# Patient Record
Sex: Male | Born: 1935 | Race: White | Hispanic: No | Marital: Married | Smoking: Former smoker
Health system: Southern US, Community
[De-identification: ages and names within clinical notes are randomized; demographics above are authoritative.]

## PROBLEM LIST (undated history)

## (undated) DIAGNOSIS — D649 Anemia, unspecified: Secondary | ICD-10-CM

## (undated) DIAGNOSIS — K922 Gastrointestinal hemorrhage, unspecified: Secondary | ICD-10-CM

## (undated) DIAGNOSIS — Z86718 Personal history of other venous thrombosis and embolism: Secondary | ICD-10-CM

## (undated) DIAGNOSIS — G459 Transient cerebral ischemic attack, unspecified: Secondary | ICD-10-CM

## (undated) DIAGNOSIS — L719 Rosacea, unspecified: Secondary | ICD-10-CM

## (undated) DIAGNOSIS — N400 Enlarged prostate without lower urinary tract symptoms: Secondary | ICD-10-CM

## (undated) DIAGNOSIS — I639 Cerebral infarction, unspecified: Secondary | ICD-10-CM

## (undated) DIAGNOSIS — R040 Epistaxis: Secondary | ICD-10-CM

## (undated) DIAGNOSIS — I4892 Unspecified atrial flutter: Secondary | ICD-10-CM

## (undated) DIAGNOSIS — M1990 Unspecified osteoarthritis, unspecified site: Secondary | ICD-10-CM

## (undated) DIAGNOSIS — I10 Essential (primary) hypertension: Secondary | ICD-10-CM

## (undated) DIAGNOSIS — Z9889 Other specified postprocedural states: Secondary | ICD-10-CM

## (undated) HISTORY — DX: Gastrointestinal hemorrhage, unspecified: K92.2

## (undated) HISTORY — PX: JOINT REPLACEMENT: SHX530

## (undated) HISTORY — PX: MITRAL VALVE REPAIR: SHX2039

## (undated) HISTORY — DX: Benign prostatic hyperplasia without lower urinary tract symptoms: N40.0

## (undated) HISTORY — DX: Transient cerebral ischemic attack, unspecified: G45.9

## (undated) HISTORY — DX: Essential (primary) hypertension: I10

## (undated) HISTORY — DX: Unspecified osteoarthritis, unspecified site: M19.90

## (undated) HISTORY — DX: Unspecified atrial flutter: I48.92

## (undated) HISTORY — PX: OTHER SURGICAL HISTORY: SHX169

## (undated) HISTORY — DX: Other specified postprocedural states: Z98.890

## (undated) HISTORY — PX: TOTAL HIP ARTHROPLASTY: SHX124

## (undated) HISTORY — DX: Anemia, unspecified: D64.9

## (undated) HISTORY — DX: Cerebral infarction, unspecified: I63.9

## (undated) HISTORY — DX: Rosacea, unspecified: L71.9

## (undated) HISTORY — PX: APPENDECTOMY: SHX54

## (undated) HISTORY — DX: Epistaxis: R04.0

## (undated) HISTORY — PX: HERNIA REPAIR: SHX51

## (undated) HISTORY — DX: Personal history of other venous thrombosis and embolism: Z86.718

---

## 1999-11-07 ENCOUNTER — Encounter: Payer: Self-pay | Admitting: Neurosurgery

## 1999-11-07 ENCOUNTER — Ambulatory Visit (HOSPITAL_COMMUNITY): Admission: RE | Admit: 1999-11-07 | Discharge: 1999-11-07 | Payer: Self-pay | Admitting: Neurosurgery

## 2000-09-16 ENCOUNTER — Inpatient Hospital Stay (HOSPITAL_COMMUNITY): Admission: EM | Admit: 2000-09-16 | Discharge: 2000-09-20 | Payer: Self-pay | Admitting: Emergency Medicine

## 2000-09-20 ENCOUNTER — Emergency Department (HOSPITAL_COMMUNITY): Admission: EM | Admit: 2000-09-20 | Discharge: 2000-09-20 | Payer: Self-pay

## 2000-12-12 ENCOUNTER — Encounter: Payer: Self-pay | Admitting: Internal Medicine

## 2000-12-12 ENCOUNTER — Other Ambulatory Visit: Admission: RE | Admit: 2000-12-12 | Discharge: 2000-12-12 | Payer: Self-pay | Admitting: Gastroenterology

## 2000-12-12 ENCOUNTER — Encounter (INDEPENDENT_AMBULATORY_CARE_PROVIDER_SITE_OTHER): Payer: Self-pay | Admitting: Specialist

## 2004-01-01 ENCOUNTER — Emergency Department (HOSPITAL_COMMUNITY): Admission: EM | Admit: 2004-01-01 | Discharge: 2004-01-02 | Payer: Self-pay | Admitting: Emergency Medicine

## 2004-01-01 ENCOUNTER — Emergency Department (HOSPITAL_COMMUNITY): Admission: EM | Admit: 2004-01-01 | Discharge: 2004-01-01 | Payer: Self-pay | Admitting: Emergency Medicine

## 2004-06-27 ENCOUNTER — Ambulatory Visit: Payer: Self-pay | Admitting: *Deleted

## 2004-07-13 ENCOUNTER — Ambulatory Visit: Payer: Self-pay | Admitting: Cardiology

## 2004-08-03 ENCOUNTER — Ambulatory Visit: Payer: Self-pay | Admitting: Cardiology

## 2004-08-08 ENCOUNTER — Ambulatory Visit: Payer: Self-pay | Admitting: Internal Medicine

## 2004-08-10 ENCOUNTER — Ambulatory Visit: Payer: Self-pay | Admitting: Internal Medicine

## 2004-08-12 ENCOUNTER — Ambulatory Visit: Payer: Self-pay | Admitting: Cardiology

## 2004-08-24 ENCOUNTER — Ambulatory Visit: Payer: Self-pay | Admitting: Cardiology

## 2004-09-05 ENCOUNTER — Ambulatory Visit: Payer: Self-pay | Admitting: *Deleted

## 2004-09-21 ENCOUNTER — Ambulatory Visit: Payer: Self-pay | Admitting: Cardiology

## 2004-10-06 ENCOUNTER — Ambulatory Visit: Payer: Self-pay | Admitting: *Deleted

## 2004-10-20 ENCOUNTER — Ambulatory Visit: Payer: Self-pay | Admitting: Cardiology

## 2004-10-21 ENCOUNTER — Ambulatory Visit: Payer: Self-pay | Admitting: Cardiology

## 2004-10-28 ENCOUNTER — Ambulatory Visit: Payer: Self-pay | Admitting: Cardiology

## 2004-11-10 ENCOUNTER — Ambulatory Visit: Payer: Self-pay | Admitting: Cardiology

## 2004-11-17 ENCOUNTER — Ambulatory Visit: Payer: Self-pay | Admitting: Internal Medicine

## 2004-12-01 ENCOUNTER — Ambulatory Visit: Payer: Self-pay | Admitting: Cardiology

## 2004-12-22 ENCOUNTER — Ambulatory Visit: Payer: Self-pay

## 2005-01-06 ENCOUNTER — Ambulatory Visit: Payer: Self-pay | Admitting: Cardiology

## 2005-01-19 ENCOUNTER — Ambulatory Visit: Payer: Self-pay

## 2005-01-19 ENCOUNTER — Encounter: Payer: Self-pay | Admitting: Internal Medicine

## 2005-01-30 ENCOUNTER — Ambulatory Visit: Payer: Self-pay | Admitting: Internal Medicine

## 2005-02-08 ENCOUNTER — Ambulatory Visit: Payer: Self-pay | Admitting: Cardiology

## 2005-02-08 ENCOUNTER — Ambulatory Visit: Payer: Self-pay

## 2005-02-17 ENCOUNTER — Ambulatory Visit: Payer: Self-pay | Admitting: Internal Medicine

## 2005-03-08 ENCOUNTER — Ambulatory Visit: Payer: Self-pay | Admitting: Cardiology

## 2005-03-15 ENCOUNTER — Ambulatory Visit: Payer: Self-pay | Admitting: Internal Medicine

## 2005-05-19 ENCOUNTER — Ambulatory Visit: Payer: Self-pay | Admitting: Internal Medicine

## 2005-08-24 ENCOUNTER — Ambulatory Visit: Payer: Self-pay | Admitting: Internal Medicine

## 2005-09-06 ENCOUNTER — Ambulatory Visit: Payer: Self-pay | Admitting: Cardiology

## 2005-09-29 ENCOUNTER — Ambulatory Visit: Payer: Self-pay | Admitting: Internal Medicine

## 2005-12-14 ENCOUNTER — Ambulatory Visit: Payer: Self-pay | Admitting: Internal Medicine

## 2005-12-15 ENCOUNTER — Ambulatory Visit: Payer: Self-pay | Admitting: Internal Medicine

## 2006-03-15 ENCOUNTER — Ambulatory Visit: Payer: Self-pay | Admitting: Internal Medicine

## 2006-05-24 ENCOUNTER — Ambulatory Visit: Payer: Self-pay | Admitting: Internal Medicine

## 2006-06-07 ENCOUNTER — Ambulatory Visit: Payer: Self-pay | Admitting: Cardiology

## 2006-08-24 ENCOUNTER — Ambulatory Visit: Payer: Self-pay | Admitting: Internal Medicine

## 2006-10-01 ENCOUNTER — Ambulatory Visit: Payer: Self-pay | Admitting: Internal Medicine

## 2006-10-05 ENCOUNTER — Ambulatory Visit: Payer: Self-pay | Admitting: Internal Medicine

## 2006-11-02 ENCOUNTER — Ambulatory Visit: Payer: Self-pay | Admitting: Internal Medicine

## 2006-11-23 ENCOUNTER — Ambulatory Visit: Payer: Self-pay | Admitting: Internal Medicine

## 2006-11-30 ENCOUNTER — Ambulatory Visit: Payer: Self-pay | Admitting: Internal Medicine

## 2006-11-30 LAB — CONVERTED CEMR LAB
ALT: 25 units/L (ref 0–40)
AST: 32 units/L (ref 0–37)
Albumin: 3.6 g/dL (ref 3.5–5.2)
Alkaline Phosphatase: 81 units/L (ref 39–117)
BUN: 12 mg/dL (ref 6–23)
Basophils Absolute: 0 10*3/uL (ref 0.0–0.1)
Basophils Relative: 0.4 % (ref 0.0–1.0)
Bilirubin, Direct: 0.2 mg/dL (ref 0.0–0.3)
CO2: 32 meq/L (ref 19–32)
Calcium: 9.2 mg/dL (ref 8.4–10.5)
Chloride: 106 meq/L (ref 96–112)
Cholesterol: 148 mg/dL (ref 0–200)
Creatinine, Ser: 0.8 mg/dL (ref 0.4–1.5)
Eosinophils Absolute: 0 10*3/uL (ref 0.0–0.6)
Eosinophils Relative: 0.7 % (ref 0.0–5.0)
GFR calc Af Amer: 123 mL/min
GFR calc non Af Amer: 101 mL/min
Glucose, Bld: 75 mg/dL (ref 70–99)
HCT: 44 % (ref 39.0–52.0)
HDL: 48.6 mg/dL (ref >39.0)
Hemoglobin: 15.1 g/dL (ref 13.0–17.0)
LDL Cholesterol: 88 mg/dL (ref 0–99)
Lymphocytes Relative: 26.1 % (ref 12.0–46.0)
MCHC: 34.3 g/dL (ref 30.0–36.0)
MCV: 88.6 fL (ref 78.0–100.0)
Monocytes Absolute: 0.4 10*3/uL (ref 0.2–0.7)
Monocytes Relative: 8.8 % (ref 3.0–11.0)
Neutro Abs: 3.3 10*3/uL (ref 1.4–7.7)
Neutrophils Relative %: 64 % (ref 43.0–77.0)
PSA: 1.21 ng/mL (ref 0.10–4.00)
Platelets: 184 10*3/uL (ref 150–400)
Potassium: 4.4 meq/L (ref 3.5–5.1)
RBC: 4.97 M/uL (ref 4.22–5.81)
RDW: 13.9 % (ref 11.5–14.6)
Sodium: 144 meq/L (ref 135–145)
TSH: 1.89 microintl units/mL (ref 0.35–5.50)
Total Bilirubin: 1.1 mg/dL (ref 0.3–1.2)
Total CHOL/HDL Ratio: 3
Total Protein: 6.6 g/dL (ref 6.0–8.3)
Triglycerides: 55 mg/dL (ref 0–149)
VLDL: 11 mg/dL (ref 0–40)
WBC: 5 10*3/uL (ref 4.5–10.5)

## 2006-12-07 ENCOUNTER — Ambulatory Visit: Payer: Self-pay | Admitting: Internal Medicine

## 2007-02-13 DIAGNOSIS — Z86718 Personal history of other venous thrombosis and embolism: Secondary | ICD-10-CM | POA: Insufficient documentation

## 2007-02-13 DIAGNOSIS — L719 Rosacea, unspecified: Secondary | ICD-10-CM | POA: Insufficient documentation

## 2007-02-13 DIAGNOSIS — I2510 Atherosclerotic heart disease of native coronary artery without angina pectoris: Secondary | ICD-10-CM | POA: Insufficient documentation

## 2007-02-13 DIAGNOSIS — Z8679 Personal history of other diseases of the circulatory system: Secondary | ICD-10-CM | POA: Insufficient documentation

## 2007-03-06 ENCOUNTER — Ambulatory Visit: Payer: Self-pay | Admitting: Internal Medicine

## 2007-03-06 ENCOUNTER — Encounter: Payer: Self-pay | Admitting: Internal Medicine

## 2007-03-06 DIAGNOSIS — I10 Essential (primary) hypertension: Secondary | ICD-10-CM | POA: Insufficient documentation

## 2007-03-06 DIAGNOSIS — K279 Peptic ulcer, site unspecified, unspecified as acute or chronic, without hemorrhage or perforation: Secondary | ICD-10-CM | POA: Insufficient documentation

## 2007-03-06 DIAGNOSIS — N400 Enlarged prostate without lower urinary tract symptoms: Secondary | ICD-10-CM | POA: Insufficient documentation

## 2007-03-11 LAB — CONVERTED CEMR LAB
PSA, Free Pct: 25 (ref >25)
PSA, Free: 0.3 ng/mL
PSA: 1.18 ng/mL (ref 0.10–4.00)

## 2007-04-01 ENCOUNTER — Ambulatory Visit: Payer: Self-pay | Admitting: Internal Medicine

## 2007-04-18 ENCOUNTER — Ambulatory Visit: Payer: Self-pay | Admitting: Cardiology

## 2007-04-26 ENCOUNTER — Telehealth: Payer: Self-pay | Admitting: Internal Medicine

## 2007-05-08 ENCOUNTER — Encounter: Payer: Self-pay | Admitting: Internal Medicine

## 2007-05-15 ENCOUNTER — Encounter: Payer: Self-pay | Admitting: Internal Medicine

## 2007-05-16 ENCOUNTER — Ambulatory Visit: Payer: Self-pay | Admitting: Cardiology

## 2007-05-16 ENCOUNTER — Encounter: Payer: Self-pay | Admitting: Cardiology

## 2007-05-16 ENCOUNTER — Ambulatory Visit: Payer: Self-pay

## 2007-06-05 ENCOUNTER — Ambulatory Visit: Payer: Self-pay | Admitting: Cardiology

## 2007-06-07 ENCOUNTER — Ambulatory Visit: Payer: Self-pay | Admitting: Internal Medicine

## 2007-06-07 LAB — CONVERTED CEMR LAB
BUN: 14 mg/dL (ref 6–23)
Basophils Absolute: 0 10*3/uL (ref 0.0–0.1)
Basophils Relative: 0.5 % (ref 0.0–1.0)
CO2: 33 meq/L — ABNORMAL HIGH (ref 19–32)
Calcium: 9 mg/dL (ref 8.4–10.5)
Chloride: 106 meq/L (ref 96–112)
Cholesterol: 155 mg/dL (ref 0–200)
Creatinine, Ser: 0.7 mg/dL (ref 0.4–1.5)
Eosinophils Absolute: 0.1 10*3/uL (ref 0.0–0.6)
Eosinophils Relative: 1.5 % (ref 0.0–5.0)
GFR calc Af Amer: 143 mL/min
GFR calc non Af Amer: 118 mL/min
Glucose, Bld: 90 mg/dL (ref 70–99)
HCT: 40.3 % (ref 39.0–52.0)
HDL: 42.8 mg/dL (ref >39.0)
Hemoglobin: 14 g/dL (ref 13.0–17.0)
LDL Cholesterol: 101 mg/dL — ABNORMAL HIGH (ref 0–99)
Lymphocytes Relative: 27.8 % (ref 12.0–46.0)
MCHC: 34.8 g/dL (ref 30.0–36.0)
MCV: 91 fL (ref 78.0–100.0)
Monocytes Absolute: 0.4 10*3/uL (ref 0.2–0.7)
Monocytes Relative: 10 % (ref 3.0–11.0)
Neutro Abs: 2.6 10*3/uL (ref 1.4–7.7)
Neutrophils Relative %: 60.2 % (ref 43.0–77.0)
Platelets: 174 10*3/uL (ref 150–400)
Potassium: 3.9 meq/L (ref 3.5–5.1)
RBC: 4.43 M/uL (ref 4.22–5.81)
RDW: 12.9 % (ref 11.5–14.6)
Sodium: 143 meq/L (ref 135–145)
Total CHOL/HDL Ratio: 3.6
Triglycerides: 57 mg/dL (ref 0–149)
VLDL: 11 mg/dL (ref 0–40)
WBC: 4.3 10*3/uL — ABNORMAL LOW (ref 4.5–10.5)

## 2007-06-18 ENCOUNTER — Telehealth: Payer: Self-pay | Admitting: Internal Medicine

## 2007-06-19 ENCOUNTER — Telehealth: Payer: Self-pay | Admitting: Internal Medicine

## 2007-06-21 ENCOUNTER — Ambulatory Visit: Payer: Self-pay | Admitting: Cardiology

## 2007-07-10 ENCOUNTER — Ambulatory Visit: Payer: Self-pay | Admitting: Internal Medicine

## 2007-07-12 ENCOUNTER — Telehealth (INDEPENDENT_AMBULATORY_CARE_PROVIDER_SITE_OTHER): Payer: Self-pay | Admitting: *Deleted

## 2007-07-22 ENCOUNTER — Ambulatory Visit: Payer: Self-pay | Admitting: Cardiology

## 2007-08-07 ENCOUNTER — Ambulatory Visit: Payer: Self-pay | Admitting: Cardiology

## 2007-08-21 ENCOUNTER — Encounter: Payer: Self-pay | Admitting: Internal Medicine

## 2007-08-28 ENCOUNTER — Ambulatory Visit: Payer: Self-pay | Admitting: Cardiovascular Disease

## 2007-09-04 ENCOUNTER — Ambulatory Visit: Payer: Self-pay | Admitting: Internal Medicine

## 2007-09-19 ENCOUNTER — Ambulatory Visit: Payer: Self-pay | Admitting: Cardiovascular Disease

## 2007-10-17 ENCOUNTER — Ambulatory Visit: Payer: Self-pay | Admitting: Cardiology

## 2007-11-05 ENCOUNTER — Telehealth: Payer: Self-pay | Admitting: Internal Medicine

## 2007-11-07 ENCOUNTER — Ambulatory Visit: Payer: Self-pay | Admitting: Cardiology

## 2007-11-13 ENCOUNTER — Encounter: Payer: Self-pay | Admitting: Internal Medicine

## 2007-12-06 ENCOUNTER — Ambulatory Visit: Payer: Self-pay | Admitting: Cardiology

## 2007-12-11 ENCOUNTER — Ambulatory Visit: Payer: Self-pay | Admitting: Internal Medicine

## 2007-12-27 ENCOUNTER — Ambulatory Visit: Payer: Self-pay | Admitting: Cardiology

## 2008-01-17 ENCOUNTER — Ambulatory Visit: Payer: Self-pay | Admitting: Cardiology

## 2008-02-14 ENCOUNTER — Ambulatory Visit: Payer: Self-pay | Admitting: Cardiology

## 2008-03-12 ENCOUNTER — Ambulatory Visit: Payer: Self-pay | Admitting: Internal Medicine

## 2008-03-12 DIAGNOSIS — I059 Rheumatic mitral valve disease, unspecified: Secondary | ICD-10-CM | POA: Insufficient documentation

## 2008-03-12 DIAGNOSIS — I4892 Unspecified atrial flutter: Secondary | ICD-10-CM | POA: Insufficient documentation

## 2008-03-12 LAB — CONVERTED CEMR LAB
ALT: 26 units/L (ref 0–53)
AST: 32 units/L (ref 0–37)
Albumin: 4 g/dL (ref 3.5–5.2)
Alkaline Phosphatase: 59 units/L (ref 39–117)
BUN: 11 mg/dL (ref 6–23)
Basophils Absolute: 0 10*3/uL (ref 0.0–0.1)
Basophils Relative: 0.3 % (ref 0.0–3.0)
Bilirubin, Direct: 0.1 mg/dL (ref 0.0–0.3)
CO2: 29 meq/L (ref 19–32)
Calcium: 9.1 mg/dL (ref 8.4–10.5)
Chloride: 101 meq/L (ref 96–112)
Cholesterol: 142 mg/dL (ref 0–200)
Creatinine, Ser: 0.7 mg/dL (ref 0.4–1.5)
Eosinophils Absolute: 0 10*3/uL (ref 0.0–0.7)
Eosinophils Relative: 0.9 % (ref 0.0–5.0)
GFR calc Af Amer: 142 mL/min
GFR calc non Af Amer: 117 mL/min
Glucose, Bld: 90 mg/dL (ref 70–99)
HCT: 39.1 % (ref 39.0–52.0)
HDL: 41.4 mg/dL (ref >39.0)
Hemoglobin: 14.1 g/dL (ref 13.0–17.0)
LDL Cholesterol: 87 mg/dL (ref 0–99)
Lymphocytes Relative: 24.4 % (ref 12.0–46.0)
MCHC: 36 g/dL (ref 30.0–36.0)
MCV: 91.9 fL (ref 78.0–100.0)
Monocytes Absolute: 0.5 10*3/uL (ref 0.1–1.0)
Monocytes Relative: 10.1 % (ref 3.0–12.0)
Neutro Abs: 3.2 10*3/uL (ref 1.4–7.7)
Neutrophils Relative %: 64.3 % (ref 43.0–77.0)
PSA: 1.66 ng/mL (ref 0.10–4.00)
Platelets: 166 10*3/uL (ref 150–400)
Potassium: 3.7 meq/L (ref 3.5–5.1)
RBC: 4.26 M/uL (ref 4.22–5.81)
RDW: 13.3 % (ref 11.5–14.6)
Sodium: 137 meq/L (ref 135–145)
TSH: 1.24 microintl units/mL (ref 0.35–5.50)
Total Bilirubin: 1.5 mg/dL — ABNORMAL HIGH (ref 0.3–1.2)
Total CHOL/HDL Ratio: 3.4
Total Protein: 6.6 g/dL (ref 6.0–8.3)
Triglycerides: 69 mg/dL (ref 0–149)
VLDL: 14 mg/dL (ref 0–40)
WBC: 4.9 10*3/uL (ref 4.5–10.5)

## 2008-03-16 ENCOUNTER — Telehealth: Payer: Self-pay | Admitting: *Deleted

## 2008-03-17 ENCOUNTER — Telehealth: Payer: Self-pay | Admitting: Internal Medicine

## 2008-03-19 ENCOUNTER — Encounter: Payer: Self-pay | Admitting: Internal Medicine

## 2008-04-09 ENCOUNTER — Ambulatory Visit: Payer: Self-pay | Admitting: Cardiology

## 2008-05-04 ENCOUNTER — Encounter: Payer: Self-pay | Admitting: *Deleted

## 2008-05-07 ENCOUNTER — Ambulatory Visit: Payer: Self-pay | Admitting: Cardiology

## 2008-05-15 ENCOUNTER — Encounter: Payer: Self-pay | Admitting: Internal Medicine

## 2008-05-21 ENCOUNTER — Ambulatory Visit: Payer: Self-pay | Admitting: Cardiology

## 2008-06-04 ENCOUNTER — Ambulatory Visit: Payer: Self-pay | Admitting: Cardiology

## 2008-06-12 ENCOUNTER — Ambulatory Visit: Payer: Self-pay | Admitting: Internal Medicine

## 2008-06-12 DIAGNOSIS — T887XXA Unspecified adverse effect of drug or medicament, initial encounter: Secondary | ICD-10-CM | POA: Insufficient documentation

## 2008-06-12 LAB — CONVERTED CEMR LAB
ALT: 25 units/L (ref 0–53)
AST: 30 units/L (ref 0–37)
Albumin: 3.7 g/dL (ref 3.5–5.2)
Alkaline Phosphatase: 63 units/L (ref 39–117)
BUN: 14 mg/dL (ref 6–23)
Basophils Absolute: 0 10*3/uL (ref 0.0–0.1)
Basophils Relative: 0.1 % (ref 0.0–3.0)
Bilirubin, Direct: 0.1 mg/dL (ref 0.0–0.3)
CO2: 32 meq/L (ref 19–32)
Calcium: 9.1 mg/dL (ref 8.4–10.5)
Chloride: 103 meq/L (ref 96–112)
Creatinine, Ser: 0.7 mg/dL (ref 0.4–1.5)
Eosinophils Absolute: 0.1 10*3/uL (ref 0.0–0.7)
Eosinophils Relative: 2.2 % (ref 0.0–5.0)
GFR calc Af Amer: 142 mL/min
GFR calc non Af Amer: 117 mL/min
Glucose, Bld: 88 mg/dL (ref 70–99)
HCT: 40.5 % (ref 39.0–52.0)
Hemoglobin: 14.2 g/dL (ref 13.0–17.0)
Lymphocytes Relative: 26.2 % (ref 12.0–46.0)
MCHC: 35.2 g/dL (ref 30.0–36.0)
MCV: 94.2 fL (ref 78.0–100.0)
Monocytes Absolute: 0.4 10*3/uL (ref 0.1–1.0)
Monocytes Relative: 9.4 % (ref 3.0–12.0)
Neutro Abs: 2.5 10*3/uL (ref 1.4–7.7)
Neutrophils Relative %: 62.1 % (ref 43.0–77.0)
Platelets: 176 10*3/uL (ref 150–400)
Potassium: 3.8 meq/L (ref 3.5–5.1)
RBC: 4.3 M/uL (ref 4.22–5.81)
RDW: 12.6 % (ref 11.5–14.6)
Sodium: 140 meq/L (ref 135–145)
Total Bilirubin: 0.8 mg/dL (ref 0.3–1.2)
Total Protein: 6.7 g/dL (ref 6.0–8.3)
WBC: 4.1 10*3/uL — ABNORMAL LOW (ref 4.5–10.5)

## 2008-07-02 ENCOUNTER — Ambulatory Visit: Payer: Self-pay | Admitting: Cardiology

## 2008-07-02 LAB — CONVERTED CEMR LAB
ALT: 26 units/L (ref 0–53)
AST: 28 units/L (ref 0–37)
Albumin: 3.7 g/dL (ref 3.5–5.2)
Alkaline Phosphatase: 67 units/L (ref 39–117)
BUN: 13 mg/dL (ref 6–23)
Bilirubin, Direct: 0.1 mg/dL (ref 0.0–0.3)
CO2: 30 meq/L (ref 19–32)
Calcium: 9 mg/dL (ref 8.4–10.5)
Chloride: 102 meq/L (ref 96–112)
Cholesterol: 158 mg/dL (ref 0–200)
Creatinine, Ser: 0.7 mg/dL (ref 0.4–1.5)
GFR calc Af Amer: 142 mL/min
GFR calc non Af Amer: 117 mL/min
Glucose, Bld: 96 mg/dL (ref 70–99)
HDL: 46 mg/dL (ref >39.0)
LDL Cholesterol: 102 mg/dL — ABNORMAL HIGH (ref 0–99)
Potassium: 3.5 meq/L (ref 3.5–5.1)
Sodium: 138 meq/L (ref 135–145)
Total Bilirubin: 0.9 mg/dL (ref 0.3–1.2)
Total CHOL/HDL Ratio: 3.4
Total Protein: 6.4 g/dL (ref 6.0–8.3)
Triglycerides: 49 mg/dL (ref 0–149)
VLDL: 10 mg/dL (ref 0–40)

## 2008-07-22 ENCOUNTER — Ambulatory Visit: Payer: Self-pay | Admitting: Cardiology

## 2008-08-13 ENCOUNTER — Ambulatory Visit: Payer: Self-pay | Admitting: Cardiovascular Disease

## 2008-09-01 ENCOUNTER — Telehealth: Payer: Self-pay | Admitting: Internal Medicine

## 2008-09-03 ENCOUNTER — Encounter: Payer: Self-pay | Admitting: Internal Medicine

## 2008-09-14 ENCOUNTER — Ambulatory Visit: Payer: Self-pay | Admitting: Internal Medicine

## 2008-09-14 ENCOUNTER — Telehealth: Payer: Self-pay | Admitting: Internal Medicine

## 2008-09-23 ENCOUNTER — Ambulatory Visit: Payer: Self-pay | Admitting: Cardiovascular Disease

## 2008-10-15 ENCOUNTER — Ambulatory Visit: Payer: Self-pay | Admitting: Cardiology

## 2008-10-29 ENCOUNTER — Ambulatory Visit: Payer: Self-pay | Admitting: Cardiology

## 2008-11-19 ENCOUNTER — Ambulatory Visit: Payer: Self-pay | Admitting: Internal Medicine

## 2008-11-19 DIAGNOSIS — R040 Epistaxis: Secondary | ICD-10-CM | POA: Insufficient documentation

## 2008-11-23 ENCOUNTER — Ambulatory Visit: Payer: Self-pay | Admitting: Cardiology

## 2008-12-14 ENCOUNTER — Ambulatory Visit: Payer: Self-pay | Admitting: Internal Medicine

## 2009-01-11 ENCOUNTER — Encounter: Payer: Self-pay | Admitting: Internal Medicine

## 2009-01-13 ENCOUNTER — Ambulatory Visit: Payer: Self-pay | Admitting: Cardiology

## 2009-01-15 ENCOUNTER — Encounter: Payer: Self-pay | Admitting: Internal Medicine

## 2009-01-19 ENCOUNTER — Encounter: Payer: Self-pay | Admitting: *Deleted

## 2009-02-03 ENCOUNTER — Ambulatory Visit: Payer: Self-pay | Admitting: Internal Medicine

## 2009-02-03 LAB — CONVERTED CEMR LAB
POC INR: 3.5
Protime: 22.5

## 2009-02-18 ENCOUNTER — Ambulatory Visit: Payer: Self-pay | Admitting: Internal Medicine

## 2009-02-18 LAB — CONVERTED CEMR LAB
ALT: 26 units/L (ref 0–53)
AST: 28 units/L (ref 0–37)
Albumin: 3.8 g/dL (ref 3.5–5.2)
Alkaline Phosphatase: 71 units/L (ref 39–117)
Calcium: 9.1 mg/dL (ref 8.4–10.5)
Cholesterol: 149 mg/dL (ref 0–200)
Creatinine, Ser: 0.6 mg/dL (ref 0.4–1.5)
GFR calc non Af Amer: 139.94 mL/min (ref >60)
HDL: 41.2 mg/dL (ref >39.00)
INR: 2.7
Prothrombin Time: 19.9 s
Sodium: 141 meq/L (ref 135–145)
Total CHOL/HDL Ratio: 4
Total Protein: 6.5 g/dL (ref 6.0–8.3)
Triglycerides: 70 mg/dL (ref 0.0–149.0)

## 2009-02-20 DIAGNOSIS — Z8669 Personal history of other diseases of the nervous system and sense organs: Secondary | ICD-10-CM | POA: Insufficient documentation

## 2009-02-20 DIAGNOSIS — Z9889 Other specified postprocedural states: Secondary | ICD-10-CM | POA: Insufficient documentation

## 2009-02-20 DIAGNOSIS — M1990 Unspecified osteoarthritis, unspecified site: Secondary | ICD-10-CM | POA: Insufficient documentation

## 2009-02-20 DIAGNOSIS — Z96649 Presence of unspecified artificial hip joint: Secondary | ICD-10-CM | POA: Insufficient documentation

## 2009-02-20 DIAGNOSIS — Z9089 Acquired absence of other organs: Secondary | ICD-10-CM | POA: Insufficient documentation

## 2009-02-23 ENCOUNTER — Ambulatory Visit: Payer: Self-pay | Admitting: Internal Medicine

## 2009-02-23 LAB — CONVERTED CEMR LAB
Basophils Absolute: 0 10*3/uL (ref 0.0–0.1)
HCT: 40.6 % (ref 39.0–52.0)
Lymphs Abs: 1 10*3/uL (ref 0.7–4.0)
Monocytes Absolute: 0.4 10*3/uL (ref 0.1–1.0)
Monocytes Relative: 9.8 % (ref 3.0–12.0)
Neutrophils Relative %: 59.3 % (ref 43.0–77.0)
Platelets: 150 10*3/uL (ref 150.0–400.0)
RDW: 12.9 % (ref 11.5–14.6)
WBC: 3.6 10*3/uL — ABNORMAL LOW (ref 4.5–10.5)

## 2009-02-24 ENCOUNTER — Encounter: Payer: Self-pay | Admitting: *Deleted

## 2009-03-08 ENCOUNTER — Ambulatory Visit: Payer: Self-pay | Admitting: Cardiovascular Disease

## 2009-03-08 LAB — CONVERTED CEMR LAB
POC INR: 2.4
Prothrombin Time: 19 s

## 2009-03-30 ENCOUNTER — Encounter (INDEPENDENT_AMBULATORY_CARE_PROVIDER_SITE_OTHER): Payer: Self-pay | Admitting: *Deleted

## 2009-04-12 ENCOUNTER — Ambulatory Visit: Payer: Self-pay | Admitting: Cardiology

## 2009-04-23 ENCOUNTER — Ambulatory Visit: Payer: Self-pay | Admitting: Family Medicine

## 2009-04-27 ENCOUNTER — Telehealth (INDEPENDENT_AMBULATORY_CARE_PROVIDER_SITE_OTHER): Payer: Self-pay | Admitting: *Deleted

## 2009-04-27 ENCOUNTER — Telehealth: Payer: Self-pay | Admitting: Gastroenterology

## 2009-05-10 ENCOUNTER — Ambulatory Visit: Payer: Self-pay | Admitting: Cardiology

## 2009-05-10 LAB — CONVERTED CEMR LAB: POC INR: 2.9

## 2009-05-27 ENCOUNTER — Ambulatory Visit: Payer: Self-pay | Admitting: Internal Medicine

## 2009-05-27 LAB — CONVERTED CEMR LAB
Albumin: 3.7 g/dL (ref 3.5–5.2)
BUN: 16 mg/dL (ref 6–23)
Basophils Absolute: 0 10*3/uL (ref 0.0–0.1)
CO2: 32 meq/L (ref 19–32)
Eosinophils Absolute: 0.1 10*3/uL (ref 0.0–0.7)
Glucose, Bld: 82 mg/dL (ref 70–99)
HCT: 44.8 % (ref 39.0–52.0)
Hemoglobin: 15.9 g/dL (ref 13.0–17.0)
Lymphs Abs: 1.2 10*3/uL (ref 0.7–4.0)
MCHC: 35.5 g/dL (ref 30.0–36.0)
Monocytes Absolute: 0.6 10*3/uL (ref 0.1–1.0)
Neutro Abs: 3.4 10*3/uL (ref 1.4–7.7)
Potassium: 3.6 meq/L (ref 3.5–5.1)
RDW: 12.9 % (ref 11.5–14.6)
Sodium: 135 meq/L (ref 135–145)

## 2009-06-01 ENCOUNTER — Telehealth: Payer: Self-pay | Admitting: Internal Medicine

## 2009-06-07 ENCOUNTER — Ambulatory Visit: Payer: Self-pay | Admitting: Internal Medicine

## 2009-06-07 LAB — CONVERTED CEMR LAB: POC INR: 3.4

## 2009-06-30 ENCOUNTER — Ambulatory Visit: Payer: Self-pay | Admitting: Cardiology

## 2009-06-30 ENCOUNTER — Ambulatory Visit: Payer: Self-pay | Admitting: Internal Medicine

## 2009-06-30 LAB — CONVERTED CEMR LAB: POC INR: 2.7

## 2009-07-28 ENCOUNTER — Ambulatory Visit: Payer: Self-pay | Admitting: Cardiology

## 2009-07-28 ENCOUNTER — Encounter (INDEPENDENT_AMBULATORY_CARE_PROVIDER_SITE_OTHER): Payer: Self-pay | Admitting: Cardiology

## 2009-07-28 LAB — CONVERTED CEMR LAB: POC INR: 2.8

## 2009-09-03 ENCOUNTER — Ambulatory Visit: Payer: Self-pay | Admitting: Cardiology

## 2009-09-03 ENCOUNTER — Encounter (INDEPENDENT_AMBULATORY_CARE_PROVIDER_SITE_OTHER): Payer: Self-pay | Admitting: Cardiology

## 2009-09-03 LAB — CONVERTED CEMR LAB: POC INR: 3

## 2009-09-06 ENCOUNTER — Ambulatory Visit: Payer: Self-pay | Admitting: Internal Medicine

## 2009-09-06 DIAGNOSIS — L570 Actinic keratosis: Secondary | ICD-10-CM | POA: Insufficient documentation

## 2009-09-10 ENCOUNTER — Encounter: Payer: Self-pay | Admitting: Internal Medicine

## 2009-10-01 ENCOUNTER — Ambulatory Visit: Payer: Self-pay | Admitting: Internal Medicine

## 2009-10-22 ENCOUNTER — Ambulatory Visit: Payer: Self-pay | Admitting: Cardiovascular Disease

## 2009-11-01 ENCOUNTER — Encounter: Payer: Self-pay | Admitting: Cardiology

## 2009-11-01 ENCOUNTER — Ambulatory Visit: Payer: Self-pay | Admitting: Cardiology

## 2009-11-01 ENCOUNTER — Ambulatory Visit (HOSPITAL_COMMUNITY): Admission: RE | Admit: 2009-11-01 | Discharge: 2009-11-01 | Payer: Self-pay | Admitting: Cardiology

## 2009-11-01 ENCOUNTER — Ambulatory Visit: Payer: Self-pay

## 2009-11-15 ENCOUNTER — Ambulatory Visit: Payer: Self-pay | Admitting: Internal Medicine

## 2009-11-15 LAB — CONVERTED CEMR LAB: POC INR: 2

## 2009-11-19 ENCOUNTER — Encounter: Payer: Self-pay | Admitting: Internal Medicine

## 2009-12-06 ENCOUNTER — Ambulatory Visit: Payer: Self-pay | Admitting: Internal Medicine

## 2009-12-06 DIAGNOSIS — C44300 Unspecified malignant neoplasm of skin of unspecified part of face: Secondary | ICD-10-CM | POA: Insufficient documentation

## 2009-12-13 ENCOUNTER — Ambulatory Visit: Payer: Self-pay | Admitting: Cardiology

## 2009-12-29 ENCOUNTER — Telehealth: Payer: Self-pay | Admitting: Internal Medicine

## 2010-01-12 ENCOUNTER — Ambulatory Visit: Payer: Self-pay | Admitting: Internal Medicine

## 2010-01-26 ENCOUNTER — Encounter: Payer: Self-pay | Admitting: Cardiology

## 2010-01-31 ENCOUNTER — Telehealth: Payer: Self-pay | Admitting: Cardiology

## 2010-02-01 ENCOUNTER — Ambulatory Visit: Payer: Self-pay | Admitting: Internal Medicine

## 2010-02-01 ENCOUNTER — Ambulatory Visit: Payer: Self-pay | Admitting: Cardiology

## 2010-02-01 LAB — CONVERTED CEMR LAB: POC INR: 2.2

## 2010-03-03 ENCOUNTER — Ambulatory Visit: Payer: Self-pay | Admitting: Cardiology

## 2010-03-03 LAB — CONVERTED CEMR LAB: POC INR: 2.3

## 2010-03-07 ENCOUNTER — Ambulatory Visit: Payer: Self-pay | Admitting: Internal Medicine

## 2010-03-07 DIAGNOSIS — M67919 Unspecified disorder of synovium and tendon, unspecified shoulder: Secondary | ICD-10-CM | POA: Insufficient documentation

## 2010-03-07 DIAGNOSIS — M719 Bursopathy, unspecified: Secondary | ICD-10-CM

## 2010-03-31 ENCOUNTER — Ambulatory Visit: Payer: Self-pay | Admitting: Internal Medicine

## 2010-04-29 ENCOUNTER — Ambulatory Visit: Payer: Self-pay | Admitting: Cardiology

## 2010-04-29 LAB — CONVERTED CEMR LAB: POC INR: 2.5

## 2010-05-06 ENCOUNTER — Encounter: Payer: Self-pay | Admitting: Internal Medicine

## 2010-05-17 ENCOUNTER — Encounter: Payer: Self-pay | Admitting: Internal Medicine

## 2010-06-06 ENCOUNTER — Ambulatory Visit: Payer: Self-pay | Admitting: Cardiology

## 2010-06-06 LAB — CONVERTED CEMR LAB: POC INR: 2.3

## 2010-06-10 ENCOUNTER — Encounter: Payer: Self-pay | Admitting: Internal Medicine

## 2010-06-14 ENCOUNTER — Telehealth: Payer: Self-pay | Admitting: Internal Medicine

## 2010-07-04 ENCOUNTER — Ambulatory Visit: Payer: Self-pay | Admitting: Internal Medicine

## 2010-07-04 LAB — CONVERTED CEMR LAB
Alkaline Phosphatase: 78 units/L (ref 39–117)
Bilirubin, Direct: 0.2 mg/dL (ref 0.0–0.3)
CO2: 28 meq/L (ref 19–32)
Calcium: 9 mg/dL (ref 8.4–10.5)
GFR calc non Af Amer: 109.45 mL/min (ref >60)
HDL: 46.9 mg/dL (ref >39.00)
Sodium: 137 meq/L (ref 135–145)
Total CHOL/HDL Ratio: 3
Total Protein: 6.3 g/dL (ref 6.0–8.3)

## 2010-07-11 ENCOUNTER — Ambulatory Visit: Payer: Self-pay | Admitting: Internal Medicine

## 2010-07-11 LAB — CONVERTED CEMR LAB
Cholesterol, target level: 200 mg/dL
LDL Goal: 100 mg/dL

## 2010-08-04 ENCOUNTER — Ambulatory Visit: Payer: Self-pay | Admitting: Cardiovascular Disease

## 2010-08-04 LAB — CONVERTED CEMR LAB: POC INR: 2.9

## 2010-09-05 ENCOUNTER — Ambulatory Visit
Admission: RE | Admit: 2010-09-05 | Discharge: 2010-09-05 | Payer: Self-pay | Source: Home / Self Care | Attending: Cardiology | Admitting: Cardiology

## 2010-09-05 ENCOUNTER — Ambulatory Visit: Admission: RE | Admit: 2010-09-05 | Discharge: 2010-09-05 | Payer: Self-pay | Source: Home / Self Care

## 2010-09-05 ENCOUNTER — Other Ambulatory Visit: Payer: Self-pay | Admitting: Cardiology

## 2010-09-05 ENCOUNTER — Encounter: Payer: Self-pay | Admitting: Cardiology

## 2010-09-05 LAB — CBC WITH DIFFERENTIAL/PLATELET
Basophils Absolute: 0 10*3/uL (ref 0.0–0.1)
Basophils Relative: 0.2 % (ref 0.0–3.0)
Eosinophils Absolute: 0 10*3/uL (ref 0.0–0.7)
Eosinophils Relative: 0.8 % (ref 0.0–5.0)
HCT: 40.7 % (ref 39.0–52.0)
Hemoglobin: 14.1 g/dL (ref 13.0–17.0)
Lymphocytes Relative: 19 % (ref 12.0–46.0)
Lymphs Abs: 1.1 10*3/uL (ref 0.7–4.0)
MCHC: 34.7 g/dL (ref 30.0–36.0)
MCV: 93.2 fl (ref 78.0–100.0)
Monocytes Absolute: 0.6 10*3/uL (ref 0.1–1.0)
Monocytes Relative: 9.6 % (ref 3.0–12.0)
Neutro Abs: 4.2 10*3/uL (ref 1.4–7.7)
Neutrophils Relative %: 70.4 % (ref 43.0–77.0)
Platelets: 200 10*3/uL (ref 150.0–400.0)
RBC: 4.37 Mil/uL (ref 4.22–5.81)
RDW: 13.8 % (ref 11.5–14.6)
WBC: 5.9 10*3/uL (ref 4.5–10.5)

## 2010-09-05 LAB — CONVERTED CEMR LAB: POC INR: 2.6

## 2010-09-22 NOTE — Letter (Signed)
Summary: Handout Printed  Printed Handout:  - Coumadin Instructions-w/out Meds 

## 2010-09-22 NOTE — Progress Notes (Signed)
Summary: REQ FOR REFILL (Warfarin Sodium / Coumadin)  Phone Note Refill Request Message from:  Fax from Pharmacy on Dec 29, 2009 2:11 PM  Refills Requested: Medication #1:  COUMADIN 5 MG  TABS Take as directed by coumadin clinic.   Notes: Warfarin Sodium 5mg  - [facility] # [id number], [place], [place]    Initial call taken by: Debbra Riding,  Dec 29, 2009 2:11 PM    Prescriptions: COUMADIN 5 MG  TABS (WARFARIN SODIUM) Take as directed by coumadin clinic.  #90 x 3   Entered by:   Willy Eddy, LPN   Authorized by:   Stacie Glaze MD   Signed by:   Willy Eddy, LPN on 40/34/7425   Method used:   Electronically to        [facility].* (retail)       [address]       [place], [place]  [address]       Ph: [phone]       Fax: [phone]   RxID:   [id number] COUMADIN 5 MG  TABS (WARFARIN SODIUM) Take as directed by coumadin clinic.  #90 x 3   Entered by:   Willy Eddy, LPN   Authorized by:   Stacie Glaze MD   Signed by:   Willy Eddy, LPN on 25/42/7062   Method used:   Electronically to        [facility]. [phone]* (retail)       [address]       [place], [place]  [address]       Ph: [phone]       Fax: [phone]   RxID:   [id number]   Appended Document: REQ FOR REFILL (Warfarin Sodium / Coumadin) [facility] called left message on machine sent in error

## 2010-09-22 NOTE — Medication Information (Signed)
Summary: [email or web address]  Anticoagulant Therapy  Managed by: Cloyde Reams, RN, BSN Supervising MD: Gala Romney MD, Reuel Boom Indication 1: Atrial Fibrillation (ICD-427.31) Lab Used: [facility] Site: [facility] INR POC 3.5 INR RANGE 2 - 3  Dietary changes: no    Health status changes: no    Bleeding/hemorrhagic complications: no    Recent/future hospitalizations: no    Any changes in medication regimen? no    Recent/future dental: no  Any missed doses?: no       Is patient compliant with meds? yes      Comments: Pt states he took the 1/2 tablet on Sat x 1 week only, then resumed 1 tablet daily.    Allergies (verified): 1)  ! Penicillin  Anticoagulation Management History:      The patient is taking warfarin and comes in today for a routine follow up visit.  Positive risk factors for bleeding include an age of 40 years or older and history of GI bleeding.  The bleeding index is 'intermediate risk'.  Positive CHADS2 values include History of HTN.  Negative CHADS2 values include Age > 4 years old.  The start date was 03/25/2007.  His last INR was 2.8.  Anticoagulation responsible provider: Alexyss Balzarini MD, Reuel Boom.  INR POC: 3.5.  Cuvette Lot#: [id number].  Exp: 11/2010.    Anticoagulation Management Assessment/Plan:      The patient's current anticoagulation dose is Coumadin 5 mg  tabs: Take as directed by coumadin clinic..  The target INR is 2 - 3.  The next INR is due 10/22/2009.  Anticoagulation instructions were given to patient.  Results were reviewed/authorized by Cloyde Reams, RN, BSN.  He was notified by Cloyde Reams RN.         Prior Anticoagulation Instructions: INR 3.0  Take 0.5 tab each Saturday and 1 tab on all other days.  Recheck in 4 weeks.    Current Anticoagulation Instructions: INR 3.5  Skip tomorrow's dosage of coumadin, then start taking 1 tablet daily except 1/2 tablet on Saturdays.  Recheck in 3 weeks.

## 2010-09-22 NOTE — Assessment & Plan Note (Signed)
Summary: 1 mo mole removal per Bonnie/mm/PT RESCD FROM BUMP//CCM   Vital Signs:  Patient profile:   77 year old male Height:      67 inches Weight:      156 pounds BMI:     24.52 Temp:     98.2 degrees F Pulse rate:   68 / minute  Vitals Entered By: Willy Eddy, LPN (December 06, 2009 11:56 AM) CC: mole removal   CC:  mole removal.  History of Present Illness: the pt presents for mole bx for non healing ulcer on eaar and AK on the pinna of the same ear he is on coumadin  Preventive Screening-Counseling & Management  Alcohol-Tobacco     Smoking Status: quit     Passive Smoke Exposure: no  Current Problems (verified): 1)  Actinic Keratosis, Head  (ICD-702.0) 2)  Coumadin Therapy  (ICD-V58.61) 3)  Appendectomy, Hx of  (ICD-V45.79) 4)  Degenerative Joint Disease  (ICD-715.90) 5)  Benign Prostatic Hypertrophy, Hx of  (ICD-V13.8) 6)  Herniorrhaphy, Hx of  (ICD-V45.89) 7)  Hip Replacement, Total, Hx of  (ICD-V43.64) 8)  Mitral Valve Replacement, Hx of  (ICD-V15.1) 9)  Syncope, Hx of  (ICD-V12.49) 10)  Anemia, Hx of  (ICD-285.9) 11)  Upper Gastrointestinal Hemorrhage, Hx of  (ICD-578.9) 12)  Epistaxis, Recurrent  (ICD-784.7) 13)  Uns Advrs Eff Uns Rx Medicinal&biological Sbstnc  (ICD-995.20) 14)  Atrial Flutter  (ICD-427.32) 15)  Other and Unspecified Mitral Valve Diseases  (ICD-394.9) 16)  Preventive Health Care  (ICD-V70.0) 17)  Family History of Colon Ca 1st Degree Relative <60  (ICD-V16.0) 18)  Family History of Cad Male 1st Degree Relative <60  (ICD-V16.49) 19)  Family History of Cad Male 1st Degree Relative <50  (ICD-V17.3) 20)  Peptic Ulcer Disease  (ICD-533.90) 21)  Benign Prostatic Hypertrophy  (ICD-600.00) 22)  Hypertension  (ICD-401.9) 23)  Transient Ischemic Attack, Hx of  (ICD-V12.50) 24)  Dvt, Hx of  (ICD-V12.51) 25)  Rosacea  (ICD-695.3) 26)  Coronary Artery Disease  (ICD-414.00)  Current Medications (verified): 1)  Benazepril-Hydrochlorothiazide  5-6.25 Mg Tabs (Benazepril-Hydrochlorothiazide) .Marland Kitchen.. 1 Once Daily 2)  Cardura 1 Mg  Tabs (Doxazosin Mesylate) .... Take 1 Tablet By Mouth Once A Day 3)  Proscar 5 Mg  Tabs (Finasteride) .... Take 1 Tablet By Mouth Once A Day 4)  Protonix 40 Mg  Tbec (Pantoprazole Sodium) .... Take 1 Tablet By Mouth Once A Day 5)  Metoprolol Succinate 25 Mg Xr24h-Tab (Metoprolol Succinate) .Marland Kitchen.. 1 in Am and 2 in Pm 6)  Coumadin 5 Mg  Tabs (Warfarin Sodium) .... Take As Directed By Coumadin Clinic. 7)  Alprazolam 0.25 Mg  Tabs (Alprazolam) .... One By Mouth Q Hs 8)  Doxycycline Hyclate 100 Mg Caps (Doxycycline Hyclate) .Marland Kitchen.. 1 Two Times A Day 9)  Clindamycin Hcl 150 Mg Caps (Clindamycin Hcl) .... Take 4 Caps 1 Hour Prior To Dental Work  Allergies (verified): 1)  ! Penicillin   Impression & Recommendations:  Problem # 1:  ACTINIC KERATOSIS, HEAD (ICD-702.0)  the lesion was identifies as a    AK on the right ear      and 40 seconds of cryotherapy with the liguid nitrogen gun was apllied to the site. 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 1 once daily 2)  Cardura 1 Mg Tabs (Doxazosin mesylate) .... Take 1 tablet by mouth once a day 3)  Proscar 5 Mg Tabs (Finasteride) .... Take 1 tablet by mouth once a day 4)  Protonix 40 Mg Tbec (Pantoprazole sodium) .... Take 1 tablet by mouth once a day 5)  Metoprolol  Succinate 25 Mg Xr24h-tab (Metoprolol succinate) .Marland Kitchen.. 1 in am and 2 in pm 6)  Coumadin 5 Mg Tabs (Warfarin sodium) .... Take as directed by coumadin clinic. 7)  Alprazolam 0.25 Mg Tabs (Alprazolam) .... One by mouth q hs 8)  Doxycycline Hyclate 100 Mg Caps (Doxycycline hyclate) .Marland Kitchen.. 1 two times a day 9)  Clindamycin Hcl 150 Mg Caps (Clindamycin hcl) .... Take 4 caps 1 hour prior to dental work  Appended Document: 1 mo mole removal per Bonnie/mm/PT RESCD FROM BUMP//CCM     Allergies: 1)  ! 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 Using a 15 blade the lesion was removed from the posterior aspects of  the right ear  and sent for pathology. sterile dressings were applied  and wound care discussed with the pt. pt will be called with path results and will be referred  for surgery is margins are positive  Complete Medication List: 1)  Benazepril-hydrochlorothiazide 5-6.25 Mg Tabs (Benazepril-hydrochlorothiazide) .Marland Kitchen.. 1 once daily 2)  Cardura 1 Mg Tabs (Doxazosin mesylate) .... Take 1 tablet by mouth once a day 3)  Proscar 5 Mg Tabs (Finasteride) .... Take 1 tablet by mouth once a day 4)  Protonix 40 Mg Tbec (Pantoprazole sodium) .... Take 1 tablet by mouth once a day 5)  Metoprolol Succinate 25 Mg Xr24h-tab (Metoprolol succinate) .Marland Kitchen.. 1 in am and 2 in pm 6)  Coumadin 5 Mg Tabs (Warfarin sodium) .... Take as directed by coumadin clinic. 7)  Alprazolam 0.25 Mg Tabs (Alprazolam) .... One by mouth q hs 8)  Doxycycline Hyclate 100 Mg Caps (Doxycycline hyclate) .Marland Kitchen.. 1 two times a day 9)  Clindamycin Hcl 150 Mg Caps (Clindamycin hcl) .... Take 4 caps 1 hour prior to dental work

## 2010-09-22 NOTE — Medication Information (Signed)
Summary: rov/cs   Anticoagulant Therapy  Managed by: Weston Brass, PharmD PCP: Stacie Glaze MD Supervising MD: Nahser Indication 1: Atrial Fibrillation (ICD-427.31) Lab Used: [facility] Site: [facility] INR POC 2.9 INR RANGE 2 - 3  Dietary changes: no    Health status changes: no    Bleeding/hemorrhagic complications: no    Recent/future hospitalizations: no    Any changes in medication regimen? no    Recent/future dental: no  Any missed doses?: no       Is patient compliant with meds? yes       Allergies: 1)  ! Penicillin  Anticoagulation Management History:      The patient is taking warfarin and comes in today for a routine follow up visit.  Positive risk factors for bleeding include an age of 75 years or older and history of GI bleeding.  The bleeding index is 'intermediate risk'.  Positive CHADS2 values include History of HTN and Age > 41 years old.  The start date was 03/25/2007.  His last INR was 3.2.  Anticoagulation responsible Darneshia Demary: Nahser.  INR POC: 2.9.  Cuvette Lot#: [id number].  Exp: 09/2011.    Anticoagulation Management Assessment/Plan:      The patient's current anticoagulation dose is Coumadin 5 mg  tabs: Take as directed by coumadin clinic..  The target INR is 2 - 3.  The next INR is due 09/05/2010.  Anticoagulation instructions were given to patient.  Results were reviewed/authorized by Weston Brass, PharmD.  He was notified by Weston Brass PharmD.         Prior Anticoagulation Instructions: INR 2.3  Continue Coumadin as scheduled:  1 tablet every day of the week, except 1/2 tablet on Saturday.  Return to clinic in 4 weeks.   Current Anticoagulation Instructions: INR 2.9  Continue same dose of 1 tablet every day except 1/2 tablet on Saturday.  Recheck INR in 4 weeks.

## 2010-09-22 NOTE — Assessment & Plan Note (Signed)
Summary: 3 MONTH ROV/NJR   Vital Signs:  Patient profile:   77 year old male Height:      67 inches Weight:      160 pounds BMI:     25.15 Temp:     98.2 degrees F oral Pulse rate:   76 / minute Resp:     14 per minute BP sitting:   130 / 80  (left arm)  Vitals Entered By: Willy Eddy, LPN (March 07, 2010 8:01 AM) CC: roa- c/o pain in left armpit   CC:  roa- c/o pain in left armpit.  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) 2)  Actinic Keratosis, Head  (ICD-702.0) 3)  Coumadin Therapy  (ICD-V58.61) 4)  Appendectomy, Hx of  (ICD-V45.79) 5)  Degenerative Joint Disease  (ICD-715.90) 6)  Benign Prostatic Hypertrophy, Hx of  (ICD-V13.8) 7)  Herniorrhaphy, Hx of  (ICD-V45.89) 8)  Hip Replacement, Total, Hx of  (ICD-V43.64) 9)  Mitral Valve Replacement, Hx of  (ICD-V15.1) 10)  Syncope, Hx of  (ICD-V12.49) 11)  Anemia, Hx of  (ICD-285.9) 12)  Upper Gastrointestinal Hemorrhage, Hx of  (ICD-578.9) 13)  Epistaxis, Recurrent  (ICD-784.7) 14)  Uns Advrs Eff Uns Rx Medicinal&biological Sbstnc  (ICD-995.20) 15)  Atrial Flutter  (ICD-427.32) 16)  Other and Unspecified Mitral Valve Diseases  (ICD-394.9) 17)  Preventive Health Care  (ICD-V70.0) 18)  Family History of Colon Ca 1st Degree Relative <60  (ICD-V16.0) 19)  Family History of Cad Male 1st Degree Relative <60  (ICD-V16.49) 20)  Family History of Cad Male 1st Degree Relative <50  (ICD-V17.3) 21)  Peptic Ulcer Disease  (ICD-533.90) 22)  Benign Prostatic Hypertrophy  (ICD-600.00) 23)  Hypertension  (ICD-401.9) 24)  Transient Ischemic Attack, Hx  of  (ICD-V12.50) 25)  Dvt, Hx of  (ICD-V12.51) 26)  Rosacea  (ICD-695.3) 27)  Coronary Artery Disease  (ICD-414.00)  Current Problems (verified): 1)  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) 2)  Actinic Keratosis, Head  (ICD-702.0) 3)  Coumadin Therapy  (ICD-V58.61) 4)  Appendectomy, Hx of  (ICD-V45.79) 5)  Degenerative Joint Disease  (ICD-715.90) 6)  Benign Prostatic Hypertrophy, Hx of  (ICD-V13.8) 7)  Herniorrhaphy, Hx of  (ICD-V45.89) 8)  Hip Replacement, Total, Hx of  (ICD-V43.64) 9)  Mitral Valve Replacement, Hx of  (ICD-V15.1) 10)  Syncope, Hx of  (ICD-V12.49) 11)  Anemia, Hx of  (ICD-285.9) 12)  Upper Gastrointestinal Hemorrhage, Hx of  (ICD-578.9) 13)  Epistaxis, Recurrent  (ICD-784.7) 14)  Uns Advrs Eff Uns Rx Medicinal&biological Sbstnc  (ICD-995.20) 15)  Atrial Flutter  (ICD-427.32) 16)  Other and Unspecified Mitral Valve Diseases  (ICD-394.9) 17)  Preventive Health Care  (ICD-V70.0) 18)  Family History of Colon Ca 1st Degree Relative <60  (ICD-V16.0) 19)  Family History of Cad Male 1st Degree Relative <60  (ICD-V16.49) 20)  Family History of Cad Male 1st Degree Relative <50  (ICD-V17.3) 21)  Peptic Ulcer Disease  (ICD-533.90) 22)  Benign Prostatic Hypertrophy  (ICD-600.00) 23)  Hypertension  (ICD-401.9) 24)  Transient Ischemic Attack, Hx of  (ICD-V12.50) 25)  Dvt, Hx of  (  ICD-V12.51) 26)  Rosacea  (ICD-695.3) 27)  Coronary Artery Disease  (ICD-414.00)  Medications Prior to Update: 1)  Benazepril-Hydrochlorothiazide 5-6.25 Mg Tabs (Benazepril-Hydrochlorothiazide) .Marland Kitchen.. 1 Once Daily 2)  Cardura 1 Mg  Tabs (Doxazosin Mesylate) .... Take 1 Tablet By Mouth Once A Day 3)  Proscar 5 Mg  Tabs (Finasteride) .... Take 1 Tablet By Mouth Once A Day 4)  Protonix 40 Mg  Tbec (Pantoprazole Sodium) .... Take 1 Tablet By Mouth Once A Day 5)  Metoprolol Succinate 25 Mg Xr24h-Tab (Metoprolol Succinate) .Marland Kitchen.. 1 in Am and 2 in Pm 6)  Coumadin 5 Mg  Tabs (Warfarin  Sodium) .... Take As Directed By Coumadin Clinic. 7)  Alprazolam 0.25 Mg  Tabs (Alprazolam) .... One By Mouth Q Hs 8)  Doxycycline Hyclate 100 Mg Caps (Doxycycline Hyclate) .Marland Kitchen.. 1 Two Times A Day 9)  Clindamycin Hcl 150 Mg Caps (Clindamycin Hcl) .... Take 4 Caps 1 Hour Prior To Dental Work  Current Medications (verified): 1)  Benazepril-Hydrochlorothiazide 5-6.25 Mg Tabs (Benazepril-Hydrochlorothiazide) .Marland Kitchen.. 1 Once Daily 2)  Cardura 1 Mg  Tabs (Doxazosin Mesylate) .... Take 1 Tablet By Mouth Once A Day 3)  Proscar 5 Mg  Tabs (Finasteride) .... Take 1 Tablet By Mouth Once A Day 4)  Protonix 40 Mg  Tbec (Pantoprazole Sodium) .... Take 1 Tablet By Mouth Once A Day 5)  Metoprolol Succinate 25 Mg Xr24h-Tab (Metoprolol Succinate) .Marland Kitchen.. 1 in Am and 2 in Pm 6)  Coumadin 5 Mg  Tabs (Warfarin Sodium) .... Take As Directed By Coumadin Clinic. 7)  Alprazolam 0.25 Mg  Tabs (Alprazolam) .... One By Mouth Q Hs 8)  Doxycycline Hyclate 100 Mg Caps (Doxycycline Hyclate) .Marland Kitchen.. 1 Two Times A Day 9)  Clindamycin Hcl 150 Mg Caps (Clindamycin Hcl) .... Take 4 Caps 1 Hour Prior To Dental Work 10)  Terbinafine Hcl 250 Mg Tabs (Terbinafine Hcl) .... One By Mouth Daily  Allergies (verified): 1)  ! Penicillin  Past History:  Family History: Last updated: 03/06/2007 mother with severe Mitral valve dz died at age 19 Family History of CAD Male 1st degree relative <50 faher dieD mi AT AGE 66 Family History of Colon CA 1st degree relative <60 COUSIN Family History of Prostate CA 1st degree relative AT 11 COUSIN  Social History: Last updated: 03/06/2007 Retired Married Former Smoker [place]  Risk Factors: Smoking Status: quit (03/07/2010) Passive Smoke Exposure: no (03/07/2010)  Past medical, surgical, family and social histories (including risk factors) reviewed, and no changes noted (except as noted below).  Past Medical History: Reviewed history from 06/30/2009 and no changes required. DEGENERATIVE  JOINT DISEASE (ICD-715.90) BENIGN PROSTATIC HYPERTROPHY, HX OF (ICD-V13.8) ANEMIA, HX OF (ICD-285.9) UPPER GASTROINTESTINAL HEMORRHAGE, HX OF (ICD-578.9) EPISTAXIS, RECURRENT (ICD-784.7) ATRIAL FLUTTER (ICD-427.32) BENIGN PROSTATIC HYPERTROPHY (ICD-600.00) HYPERTENSION (ICD-401.9) TRANSIENT ISCHEMIC ATTACK, HX OF (ICD-V12.50) DVT, HX OF (ICD-V12.51) ROSACEA (ICD-695.3)    Past Surgical History: Reviewed history from 06/30/2009 and no changes required. APPENDECTOMY, HX OF (ICD-V45.79) HERNIORRHAPHY, HX OF (ICD-V45.89) HIP REPLACEMENT, TOTAL, HX OF (ICD-V43.64) history of mitral valve repair  Family History: Reviewed history from 03/06/2007 and no changes required. mother with severe Mitral valve dz died at age 89 Family History of CAD Male 1st degree relative <50 faher dieD mi AT AGE 3 Family History of Colon CA 1st degree relative <60 COUSIN Family History of Prostate CA 1st degree relative AT 58 COUSIN  Social History: Reviewed history from 03/06/2007 and no changes required. Retired Married Former Smoker [place]  Review of  Systems  The patient denies anorexia, fever, weight loss, weight gain, vision loss, decreased hearing, hoarseness, chest pain, syncope, dyspnea on exertion, peripheral edema, prolonged cough, headaches, hemoptysis, abdominal pain, melena, hematochezia, severe indigestion/heartburn, hematuria, incontinence, genital sores, muscle weakness, suspicious skin lesions, transient blindness, difficulty walking, depression, unusual weight change, abnormal bleeding, enlarged lymph nodes, angioedema, and breast masses.    Physical Exam  General:  Well-developed,well-nourished,in no acute distress; alert,appropriate and cooperative throughout examination Head:  normocephalic and atraumatic.  male-pattern balding.   Eyes:  pupils equal and pupils round.   Ears:  R ear normal and L ear normal.   Nose:  no external deformity and no nasal discharge.   Mouth:   pharynx pink and moist and no erythema.   Neck:  No deformities, masses, or tenderness noted. Lungs:  normal respiratory effort and no intercostal retractions.   Heart:  normal rate and regular rhythm.   2/6 systollic murmur no rub and no JVD.   Abdomen:  Bowel sounds positive,abdomen soft and non-tender without masses, organomegaly or hernias noted. Msk:  No deformity or scoliosis noted of thoracic or lumbar spine.   Pulses:  R and L carotid,radial,femoral,dorsalis pedis and posterior tibial pulses are full and equal bilaterally Extremities:  No clubbing, cyanosis, edema, or deformity noted with normal full range of motion of all joints.   Neurologic:  alert & oriented X3 and gait normal.     Impression & Recommendations:  Problem # 1:  DEGENERATIVE JOINT DISEASE (ICD-715.90)  shoulder r pain  from rotator cuff discussion of the  use of medications   Discussed use of medications, application of heat or cold, and exercises.   Problem # 2:  HYPERTENSION (ICD-401.9)  His updated medication list for this problem includes:    Benazepril-hydrochlorothiazide 5-6.25 Mg Tabs (Benazepril-hydrochlorothiazide) .Marland Kitchen... 1 once daily    Cardura 1 Mg Tabs (Doxazosin mesylate) .Marland Kitchen... Take 1 tablet by mouth once a day    Metoprolol Succinate 25 Mg Xr24h-tab (Metoprolol succinate) .Marland Kitchen... 1 in am and 2 in pm  BP today: 130/80 Prior BP: 124/76 (09/06/2009)  Prior 10 Yr Risk Heart Disease: N/A (06/07/2007)  Labs Reviewed: K+: 3.6 (05/27/2009) Creat: : 0.7 (05/27/2009)   Chol: 153 (05/27/2009)   HDL: 42.30 (05/27/2009)   LDL: 94 (02/18/2009)   TG: 70.0 (02/18/2009)  Problem # 3:  CORONARY ARTERY DISEASE (ICD-414.00)  His updated medication list for this problem includes:    Benazepril-hydrochlorothiazide 5-6.25 Mg Tabs (Benazepril-hydrochlorothiazide) .Marland Kitchen... 1 once daily    Cardura 1 Mg Tabs (Doxazosin mesylate) .Marland Kitchen... Take 1 tablet by mouth once a day    Metoprolol Succinate 25 Mg Xr24h-tab  (Metoprolol succinate) .Marland Kitchen... 1 in am and 2 in pm  Labs Reviewed: Chol: 153 (05/27/2009)   HDL: 42.30 (05/27/2009)   LDL: 94 (02/18/2009)   TG: 70.0 (02/18/2009)  Complete Medication List: 1)  Benazepril-hydrochlorothiazide 5-6.25 Mg Tabs (Benazepril-hydrochlorothiazide) .Marland Kitchen.. 1 once daily 2)  Cardura 1 Mg Tabs (Doxazosin mesylate) .... Take 1 tablet by mouth once a day 3)  Proscar 5 Mg Tabs (Finasteride) .... Take 1 tablet by mouth once a day 4)  Protonix 40 Mg Tbec (Pantoprazole sodium) .... Take 1 tablet by mouth once a day 5)  Metoprolol Succinate 25 Mg Xr24h-tab (Metoprolol succinate) .Marland Kitchen.. 1 in am and 2 in pm 6)  Coumadin 5 Mg Tabs (Warfarin sodium) .... Take as directed by coumadin clinic. 7)  Alprazolam 0.25 Mg Tabs (Alprazolam) .... One by mouth q hs 8)  Doxycycline Hyclate 100  Mg Caps (Doxycycline hyclate) .Marland Kitchen.. 1 two times a day 9)  Clindamycin Hcl 150 Mg Caps (Clindamycin hcl) .... Take 4 caps 1 hour prior to dental work 10)  Terbinafine Hcl 250 Mg Tabs (Terbinafine hcl) .... One by mouth daily  Other Orders: Orthopedic Surgeon Referral (Ortho Surgeon) Orthopedic Referral (Ortho)  Patient Instructions: 1)  Please schedule a follow-up appointment in 3 months. 2)  BMP prior to visit, ICD-9:409.10 3)  Hepatic Panel prior to visit, ICD-9:995.20 4)  Lipid Panel prior to visit, ICD-9:272.4 5)  TSH prior to visit, ICD-9:272.4 6)  PSA prior to visit, ICD-9:601.1 Prescriptions: TERBINAFINE HCL 250 MG TABS (TERBINAFINE HCL) one by mouth daily  #30 x 2   Entered and Authorized by:   Stacie Glaze MD   Signed by:   Stacie Glaze MD on 03/07/2010   Method used:   Print then Give to Patient   RxID:   [id number] CLINDAMYCIN HCL 150 MG CAPS (CLINDAMYCIN HCL) take 4 caps 1 hour prior to dental work  #4 Each x 2   Entered by:   Willy Eddy, LPN   Authorized by:   Stacie Glaze MD   Signed by:   Willy Eddy, LPN on 14/78/2956   Method used:   Print then Give to  Patient   RxID:   [id number] DOXYCYCLINE HYCLATE 100 MG CAPS (DOXYCYCLINE HYCLATE) 1 two times a day  #180 x 3   Entered by:   Willy Eddy, LPN   Authorized by:   Stacie Glaze MD   Signed by:   Willy Eddy, LPN on 28/41/3244   Method used:   Print then Give to Patient   RxID:   [id number] ALPRAZOLAM 0.25 MG  TABS (ALPRAZOLAM) one by mouth q HS  #90 x 3   Entered by:   Willy Eddy, LPN   Authorized by:   Stacie Glaze MD   Signed by:   Willy Eddy, LPN on 42/59/5638   Method used:   Print then Give to Patient   RxID:   [id number] COUMADIN 5 MG  TABS (WARFARIN SODIUM) Take as directed by coumadin clinic.  #100 x 3   Entered by:   Willy Eddy, LPN   Authorized by:   Stacie Glaze MD   Signed by:   Willy Eddy, LPN on 02/18/1600   Method used:   Print then Give to Patient   RxID:   [id number] METOPROLOL SUCCINATE 25 MG XR24H-TAB (METOPROLOL SUCCINATE) 1 in am and 2 in pm  #270.0 Each x 2   Entered by:   Willy Eddy, LPN   Authorized by:   Stacie Glaze MD   Signed by:   Willy Eddy, LPN on 70/62/3762   Method used:   Print then Give to Patient   RxID:   [id number] PROTONIX 40 MG  TBEC (PANTOPRAZOLE SODIUM) Take 1 tablet by mouth once a day  #90.0 Each x 3   Entered by:   Willy Eddy, LPN   Authorized by:   Stacie Glaze MD   Signed by:   Willy Eddy, LPN on 26/94/8546   Method used:   Print then Give to Patient   RxID:   [id number] PROSCAR 5 MG  TABS (FINASTERIDE) Take 1 tablet by mouth once a day  #90 x 3   Entered by:   Willy Eddy, LPN   Authorized by:   Balinda Quails  Lovell Sheehan MD   Signed by:   Willy Eddy, LPN on 98/06/9146   Method used:   Print then Give to Patient   RxID:   [id number] CARDURA 1 MG  TABS (DOXAZOSIN MESYLATE) Take 1 tablet by mouth once a day  #90.0 Each x 3   Entered by:   Willy Eddy, LPN   Authorized by:   Stacie Glaze MD   Signed by:    Willy Eddy, LPN on 96/29/5284   Method used:   Print then Give to Patient   RxID:   [id number] BENAZEPRIL-HYDROCHLOROTHIAZIDE 5-6.25 MG TABS (BENAZEPRIL-HYDROCHLOROTHIAZIDE) 1 once daily  #90 x 3   Entered by:   Willy Eddy, LPN   Authorized by:   Stacie Glaze MD   Signed by:   Willy Eddy, LPN on 40/34/7425   Method used:   Print then Give to Patient   RxID:   [id number]

## 2010-09-22 NOTE — Medication Information (Signed)
Summary: rov/sp  Anticoagulant Therapy  Managed by: Eda Keys, PharmD Supervising MD: Gala Romney MD, Reuel Boom Indication 1: Atrial Fibrillation (ICD-427.31) Lab Used: [facility] Site: [facility] INR POC 2.4 INR RANGE 2 - 3  Dietary changes: no    Health status changes: no    Bleeding/hemorrhagic complications: no    Recent/future hospitalizations: no    Any changes in medication regimen? no    Recent/future dental: yes     Details: recent dental work, including 3 day course of clindamycin  Any missed doses?: no       Is patient compliant with meds? yes       Allergies: 1)  ! Penicillin  Anticoagulation Management History:      The patient is taking warfarin and comes in today for a routine follow up visit.  Positive risk factors for bleeding include an age of 75 years or older and history of GI bleeding.  The bleeding index is 'intermediate risk'.  Positive CHADS2 values include History of HTN.  Negative CHADS2 values include Age > 101 years old.  The start date was 03/25/2007.  His last INR was 2.9.  Anticoagulation responsible provider: Umair Rosiles MD, Reuel Boom.  INR POC: 2.4.  Cuvette Lot#: [id number].  Exp: 03/2011.    Anticoagulation Management Assessment/Plan:      The patient's current anticoagulation dose is Coumadin 5 mg  tabs: Take as directed by coumadin clinic..  The target INR is 2 - 3.  The next INR is due 02/09/2010.  Anticoagulation instructions were given to patient.  Results were reviewed/authorized by Eda Keys, PharmD.  He was notified by Eda Keys.         Prior Anticoagulation Instructions: INR 2.3  Continue same dose of 1 tablet every day except 1/2 tablet on Saturday   Current Anticoagulation Instructions: INR 2.4  Continue taking 1 tablet every day, except for 1/2 tablet on Saturday.  Return to clinic in 4 weeks.

## 2010-09-22 NOTE — Medication Information (Signed)
Summary: rov/eac  Anticoagulant Therapy  Managed by: Elaina Pattee, PharmD Supervising MD: Gala Romney MD, Reuel Boom Indication 1: Atrial Fibrillation (ICD-427.31) Lab Used: [facility] Site: [facility] INR POC 2.2 INR RANGE 2 - 3  Dietary changes: no    Health status changes: no    Bleeding/hemorrhagic complications: no    Recent/future hospitalizations: no    Any changes in medication regimen? no    Recent/future dental: no  Any missed doses?: no       Is patient compliant with meds? yes      Comments: Pt interested in Pradaxa, however he has a hx of ulcer.  Will discuss with MD, although I advised this may not be the best option.  Allergies: 1)  ! Penicillin  Anticoagulation Management History:      The patient is taking warfarin and comes in today for a routine follow up visit.  Positive risk factors for bleeding include an age of 75 years or older and history of GI bleeding.  The bleeding index is 'intermediate risk'.  Positive CHADS2 values include History of HTN.  Negative CHADS2 values include Age > 50 years old.  The start date was 03/25/2007.  His last INR was 2.9.  Anticoagulation responsible provider: Jossilyn Benda MD, Reuel Boom.  INR POC: 2.2.  Cuvette Lot#: [id number].  Exp: 03/2011.    Anticoagulation Management Assessment/Plan:      The patient's current anticoagulation dose is Coumadin 5 mg  tabs: Take as directed by coumadin clinic..  The target INR is 2 - 3.  The next INR is due 03/01/2010.  Anticoagulation instructions were given to patient.  Results were reviewed/authorized by Elaina Pattee, PharmD.  He was notified by Elaina Pattee, PharmD.         Prior Anticoagulation Instructions: INR 2.4  Continue taking 1 tablet every day, except for 1/2 tablet on Saturday.  Return to clinic in 4 weeks.    Current Anticoagulation Instructions: INR 2.2. Take 1 tablet daily except 0.5 tablet on Saturdays.  Recheck in 4 weeks.

## 2010-09-22 NOTE — Progress Notes (Signed)
Summary: Pt req to get coumedin lvls added to labs on 07/04/10  Phone Note Call from Patient   Caller: Patient Reason for Call: Acute Illness Summary of Call: Pt is sch to come in for labs on 07/04/10 and he is req to get his coumedin lvls checked that day also. Pls advise.    Initial call taken by: Lucy Antigua,  June 14, 2010 1:21 PM  Follow-up for Phone Call        that is fine. just tell gwenn or nana that he needs a pt Follow-up by: Willy Eddy, LPN,  June 14, 2010 1:26 PM  Additional Follow-up for Phone Call Additional follow up Details #1::        Pt has been notified that the coumedin lvl has been added to labs on 07/04/10 Additional Follow-up by: Lucy Antigua,  June 14, 2010 5:00 PM

## 2010-09-22 NOTE — Letter (Signed)
Summary: [facility]  [facility]   Imported By: Maryln Gottron 06/15/2010 10:42:03  _____________________________________________________________________  External Attachment:    Type:   Image     Comment:   External Document

## 2010-09-22 NOTE — Medication Information (Signed)
Summary: rov/mj  Anticoagulant Therapy  Managed by: Bethena Midget, RN, BSN Referring MD: Jens Som MD, Arlys John PCP: Stacie Glaze MD Supervising MD: Gala Romney MD, Reuel Boom Indication 1: Atrial Fibrillation (ICD-427.31) Lab Used: [facility] Site: [facility] INR POC 2.6 INR RANGE 2 - 3  Dietary changes: no    Health status changes: no    Bleeding/hemorrhagic complications: no    Recent/future hospitalizations: no    Any changes in medication regimen? yes       Details: Planning to start Glusoamine Chondrotin  Recent/future dental: no  Any missed doses?: no       Is patient compliant with meds? yes       Allergies: 1)  ! Penicillin  Anticoagulation Management History:      The patient is taking warfarin and comes in today for a routine follow up visit.  Positive risk factors for bleeding include an age of 35 years or older and history of GI bleeding.  The bleeding index is 'intermediate risk'.  Positive CHADS2 values include History of HTN and Age > 61 years old.  The start date was 03/25/2007.  His last INR was 3.2.  Anticoagulation responsible provider: Bensimhon MD, Reuel Boom.  INR POC: 2.6.  Cuvette Lot#: [id number].  Exp: 09/2011.    Anticoagulation Management Assessment/Plan:      The patient's current anticoagulation dose is Coumadin 5 mg  tabs: Take as directed by coumadin clinic..  The target INR is 2 - 3.  The next INR is due 10/05/2010.  Anticoagulation instructions were given to patient.  Results were reviewed/authorized by Bethena Midget, RN, BSN.  He was notified by Bethena Midget, RN, BSN.         Prior Anticoagulation Instructions: INR 2.9  Continue same dose of 1 tablet every day except 1/2 tablet on Saturday.  Recheck INR in 4 weeks.   Current Anticoagulation Instructions: INR 2.6 Continue 1 tablet everyday except 1/2  tablets on Saturdays. Recheck in 4 weeks.

## 2010-09-22 NOTE — Letter (Signed)
Summary: Application for Handicapped Placard  Application for Handicapped Placard   Imported By: Maryln Gottron 05/11/2010 14:18:20  _____________________________________________________________________  External Attachment:    Type:   Image     Comment:   External Document

## 2010-09-22 NOTE — Progress Notes (Signed)
Summary: out of rhythm   Phone Note Call from Patient Call back at Work Phone [phone]   Caller: Patient Reason for Call: Talk to Nurse Summary of Call: pt states his heart is out of rhythm.... pulse has been elevated, request call back Initial call taken by: Migdalia Dk,  January 31, 2010 11:28 AM  Follow-up for Phone Call        spoke with pt, he has noticed his heart rate running high. he states last night his pulse got up to 140 and then went down after taking his evening dose of metoprolol. his bp is 100/70. he is unable to tell if he is in atrial flutter or not and he report pulse readings from 40-70 and higher. pt will cont on present meds. he will come to the office tomorrow am for an EKG to confirm rhythm. pt denies other symptoms Deliah Goody, RN  January 31, 2010 12:30 PM

## 2010-09-22 NOTE — Letter (Signed)
Summary: [facility]   Imported By: Marylou Mccoy 03/08/2010 07:45:34  _____________________________________________________________________  External Attachment:    Type:   Image     Comment:   External Document

## 2010-09-22 NOTE — Medication Information (Signed)
Summary: rov/ln  Anticoagulant Therapy  Managed by: Weston Brass, PharmD Supervising MD: Gala Romney MD, Reuel Boom Indication 1: Atrial Fibrillation (ICD-427.31) Lab Used: [facility] Site: [facility] INR POC 2.9 INR RANGE 2 - 3  Dietary changes: no    Health status changes: no    Bleeding/hemorrhagic complications: no    Recent/future hospitalizations: no    Any changes in medication regimen? no    Recent/future dental: no  Any missed doses?: no       Is patient compliant with meds? yes       Allergies: 1)  ! Penicillin  Anticoagulation Management History:      The patient is taking warfarin and comes in today for a routine follow up visit.  Positive risk factors for bleeding include an age of 75 years or older and history of GI bleeding.  The bleeding index is 'intermediate risk'.  Positive CHADS2 values include History of HTN and Age > 75 years old.  The start date was 03/25/2007.  His last INR was 2.9.  Anticoagulation responsible provider: Bensimhon MD, Reuel Boom.  INR POC: 2.9.  Cuvette Lot#: [id number].  Exp: 04/2011.    Anticoagulation Management Assessment/Plan:      The patient's current anticoagulation dose is Coumadin 5 mg  tabs: Take as directed by coumadin clinic..  The target INR is 2 - 3.  The next INR is due 04/29/2010.  Anticoagulation instructions were given to patient.  Results were reviewed/authorized by Weston Brass, PharmD.  He was notified by Gweneth Fritter, PharmD Candidate.         Prior Anticoagulation Instructions: INR 2.2  Continue same regimen of 1 tab daily except 1/2 tab on Saturday.  Re-check INR in 4 weeks.  Current Anticoagulation Instructions: INR  2.9  Continue taking 1 tablet (5mg ) every day except take 1/2 tablet (2.5mg ) on Saturdays.  Recheck in 4 weeks.

## 2010-09-22 NOTE — Assessment & Plan Note (Signed)
Summary: F1Y/DM  Medications Added GLUCOSAMINE-CHONDROITIN   CAPS (GLUCOSAMINE-CHONDROIT-VIT C-MN) 1 tab by mouth once daily METOPROLOL TARTRATE 25 MG TABS (METOPROLOL TARTRATE) Take one tablet every am and 2 tablets every pm        Primary Provider:  Stacie Glaze MD   History of Present Illness: Brian [name] is a pleasant gentleman who has a history of mitral valve repair as well as atrial flutter.  A Myoview was performed in June 2005. Ejection fraction was 64%. There was a nonreversible inferior defect which was felt to be infarct versus diaphragm. There was no ischemia. His last echocardiogram was performed in March of 2011.  At that time, he had normal LV function.  There is mild aortic insufficiency and mitral regurgitation.  Left atrium is mildly dilated. There was also right atrial and right ventricular enlargement and moderate tricuspid regurgitation. I last saw him in November of 2010. Since then he denies any dyspnea on exertion, orthopnea, PND, pedal edema, palpitations, syncope or chest pain.   Current Medications (verified): 1)  Benazepril-Hydrochlorothiazide 5-6.25 Mg Tabs (Benazepril-Hydrochlorothiazide) .Marland Kitchen.. 1 Once Daily 2)  Cardura 1 Mg  Tabs (Doxazosin Mesylate) .... Take 1 Tablet By Mouth Once A Day 3)  Proscar 5 Mg  Tabs (Finasteride) .... Take 1 Tablet By Mouth Once A Day 4)  Protonix 40 Mg  Tbec (Pantoprazole Sodium) .... Take 1 Tablet By Mouth Once A Day 5)  Metoprolol Succinate 25 Mg Xr24h-Tab (Metoprolol Succinate) .Marland Kitchen.. 1 in Am and 2 in Pm 6)  Coumadin 5 Mg  Tabs (Warfarin Sodium) .... Take As Directed By Coumadin Clinic. 7)  Alprazolam 0.25 Mg  Tabs (Alprazolam) .... One By Mouth Q Hs 8)  Doxycycline Hyclate 100 Mg Caps (Doxycycline Hyclate) .Marland Kitchen.. 1 Two Times A Day 9)  Clindamycin Hcl 150 Mg Caps (Clindamycin Hcl) .... Take 4 Caps 1 Hour Prior To Dental Work 10)  Glucosamine-Chondroitin   Caps (Glucosamine-Chondroit-Vit C-Mn) .Marland Kitchen.. 1 Tab By Mouth Once  Daily  Allergies: 1)  ! Penicillin  Past History:  Past Medical History: Reviewed history from 06/30/2009 and no changes required. DEGENERATIVE JOINT DISEASE (ICD-715.90) BENIGN PROSTATIC HYPERTROPHY, HX OF (ICD-V13.8) ANEMIA, HX OF (ICD-285.9) UPPER GASTROINTESTINAL HEMORRHAGE, HX OF (ICD-578.9) EPISTAXIS, RECURRENT (ICD-784.7) ATRIAL FLUTTER (ICD-427.32) BENIGN PROSTATIC HYPERTROPHY (ICD-600.00) HYPERTENSION (ICD-401.9) TRANSIENT ISCHEMIC ATTACK, HX OF (ICD-V12.50) DVT, HX OF (ICD-V12.51) ROSACEA (ICD-695.3)    Past Surgical History: Reviewed history from 06/30/2009 and no changes required. APPENDECTOMY, HX OF (ICD-V45.79) HERNIORRHAPHY, HX OF (ICD-V45.89) HIP REPLACEMENT, TOTAL, HX OF (ICD-V43.64) history of mitral valve repair  Social History: Reviewed history from 03/06/2007 and no changes required. Retired Married Former Smoker [place]  Review of Systems       no fevers or chills, productive cough, hemoptysis, dysphasia, odynophagia, melena, hematochezia, dysuria, hematuria, rash, seizure activity, orthopnea, PND, pedal edema, claudication. Remaining systems are negative.   Vital Signs:  Patient profile:   77 year old male Height:      87 inches Weight:      160 pounds BMI:     14.92 Pulse rate:   60 / minute Resp:     14 per minute BP sitting:   140 / 80  (left arm)  Vitals Entered By: Kem Parkinson (September 05, 2010 9:22 AM)  Physical Exam  General:  Well-developed well-nourished in no acute distress.  Skin is warm and dry.  HEENT is normal.  Neck is supple. No thyromegaly.  Chest is clear to auscultation with normal expansion.  Cardiovascular  exam is regular rate and rhythm.  Abdominal exam nontender or distended. No masses palpated. Extremities show no edema. neuro grossly intact    EKG  Procedure date:  09/05/2010  Findings:      Sinus rhythm with first degree AV block and PACs. Left anterior fascicular block. Left ventricular  hypertrophy. Prior septal infarct.  Impression & Recommendations:  Problem # 1:  ROTATOR CUFF SYNDROME, LEFT (ICD-726.10) Pt feels that he may require surgery in the near future. No cardiac symptoms. I think he can proceed without further cardiac evaluation. Given history of atrial flutter a TIA he will need to be bridged with Lovenox when he comes off of his Coumadin.  Problem # 2:  COUMADIN THERAPY (ICD-V58.61) Monitored in the Coumadin clinic.goal INR 2-3.  Problem # 3:  MITRAL VALVE REPLACEMENT, HX OF (ICD-V15.1) History of mitral valve repair. Repeat echocardiogram March 2012. Continued SBE prophylaxis. Orders: Echocardiogram (Echo)  Problem # 4:  ATRIAL FLUTTER (ICD-427.32) Change Toprol to Lopressor at patient's request secondary to financial reasons. Continue Coumadin. His updated medication list for this problem includes:    Metoprolol Succinate 25 Mg Xr24h-tab (Metoprolol succinate) .Marland Kitchen... 1 in am and 2 in pm    Coumadin 5 Mg Tabs (Warfarin sodium) .Marland Kitchen... Take as directed by coumadin clinic.    Metoprolol Tartrate 25 Mg Tabs (Metoprolol tartrate) .Marland Kitchen... Take one tablet every am and 2 tablets every pm  Problem # 5:  HYPERTENSION (ICD-401.9) Blood pressure controlled on present medications. Will continue. Potassium and renal function monitored by primary care. His updated medication list for this problem includes:    Benazepril-hydrochlorothiazide 5-6.25 Mg Tabs (Benazepril-hydrochlorothiazide) .Marland Kitchen... 1 once daily    Cardura 1 Mg Tabs (Doxazosin mesylate) .Marland Kitchen... Take 1 tablet by mouth once a day    Metoprolol Succinate 25 Mg Xr24h-tab (Metoprolol succinate) .Marland Kitchen... 1 in am and 2 in pm    Metoprolol Tartrate 25 Mg Tabs (Metoprolol tartrate) .Marland Kitchen... Take one tablet every am and 2 tablets every pm  Other Orders: TLB-CBC Platelet - w/Differential ([id number])  Patient Instructions: 1)  Your physician wants you to follow-up in: ONE YEAR  You will receive a reminder letter in the mail two  months in advance. If you don't receive a letter, please call our office to schedule the follow-up appointment. 2)  Your physician has requested that you have an echocardiogram.  Echocardiography is a painless test that uses sound waves to create images of your heart. It provides your doctor with information about the size and shape of your heart and how well your heart's chambers and valves are working.  This procedure takes approximately one hour. There are no restrictions for this procedure. 3)  Your physician has recommended you make the following change in your medication: STOP TOPROL 4)  START METOPROLOL TART 25MG  ONE TABLET IN THE AM AND 2 TABLETS IN THE PM Prescriptions: METOPROLOL TARTRATE 25 MG TABS (METOPROLOL TARTRATE) Take one tablet every am and 2 tablets every pm  #90 x 12   Entered by:   Deliah Goody, RN   Authorized by:   Ferman Hamming, MD, [facility]   Signed by:   Deliah Goody, RN on 09/05/2010   Method used:   Electronically to        [facility]. [phone]* (retail)       [address]       [place], [place]  [address]       Ph: [phone]       Fax:  [phone]   RxID:   [id number]

## 2010-09-22 NOTE — Medication Information (Signed)
Summary: rov/ewj  Anticoagulant Therapy  Managed by: Weston Brass, PharmD Supervising MD: Tenny Craw MD, Gunnar Fusi Indication 1: Atrial Fibrillation (ICD-427.31) Lab Used: [facility] Site: [facility] INR POC 2.0 INR RANGE 2 - 3  Dietary changes: no    Health status changes: no    Bleeding/hemorrhagic complications: no    Recent/future hospitalizations: no    Any changes in medication regimen? no    Recent/future dental: no  Any missed doses?: no       Is patient compliant with meds? yes       Allergies: 1)  ! Penicillin  Anticoagulation Management History:      The patient is taking warfarin and comes in today for a routine follow up visit.  Positive risk factors for bleeding include an age of 25 years or older and history of GI bleeding.  The bleeding index is 'intermediate risk'.  Positive CHADS2 values include History of HTN.  Negative CHADS2 values include Age > 77 years old.  The start date was 03/25/2007.  His last INR was 2.9.  Anticoagulation responsible provider: Tenny Craw MD, Gunnar Fusi.  INR POC: 2.0.  Cuvette Lot#: [id number].  Exp: 12/2010.    Anticoagulation Management Assessment/Plan:      The patient's current anticoagulation dose is Coumadin 5 mg  tabs: Take as directed by coumadin clinic..  The target INR is 2 - 3.  The next INR is due 12/13/2009.  Anticoagulation instructions were given to patient.  Results were reviewed/authorized by Weston Brass, PharmD.  He was notified by Weston Brass PharmD.         Prior Anticoagulation Instructions: The patient is to continue with the same dose of coumadin.  This dosage includes: 5 mg daily except 2.5mg  on Saturday.  Current Anticoagulation Instructions: INR 2.0  Continue same dose of 1 tablet every day except 1/2 tablet on Saturday

## 2010-09-22 NOTE — Letter (Signed)
Summary: Application for Handicapped Placard  Application for Handicapped Placard   Imported By: Maryln Gottron 05/17/2010 09:46:38  _____________________________________________________________________  External Attachment:    Type:   Image     Comment:   External Document

## 2010-09-22 NOTE — Letter (Signed)
Summary: [facility]  [facility]   Imported By: Maryln Gottron 11/26/2009 14:25:13  _____________________________________________________________________  External Attachment:    Type:   Image     Comment:   External Document

## 2010-09-22 NOTE — Medication Information (Signed)
Summary: rov/cb  Anticoagulant Therapy  Managed by: Weston Brass, PharmD Supervising MD: Jens Som MD,Xabi Wittler Indication 1: Atrial Fibrillation (ICD-427.31) Lab Used: [facility] Site: [facility] INR POC 2.3 INR RANGE 2 - 3  Dietary changes: no    Health status changes: no    Bleeding/hemorrhagic complications: no    Recent/future hospitalizations: no    Any changes in medication regimen? no    Recent/future dental: no  Any missed doses?: no       Is patient compliant with meds? yes       Allergies: 1)  ! Penicillin  Anticoagulation Management History:      The patient is taking warfarin and comes in today for a routine follow up visit.  Positive risk factors for bleeding include an age of 75 years or older and history of GI bleeding.  The bleeding index is 'intermediate risk'.  Positive CHADS2 values include History of HTN and Age > 38 years old.  The start date was 03/25/2007.  His last INR was 2.9.  Anticoagulation responsible provider: Jens Som MD,Kenishia Plack.  INR POC: 2.3.  Cuvette Lot#: [id number].  Exp: 04/2011.    Anticoagulation Management Assessment/Plan:      The patient's current anticoagulation dose is Coumadin 5 mg  tabs: Take as directed by coumadin clinic..  The target INR is 2 - 3.  The next INR is due 03/31/2010.  Anticoagulation instructions were given to patient.  Results were reviewed/authorized by Weston Brass, PharmD.  He was notified by Dillard Cannon.         Prior Anticoagulation Instructions: INR 2.2. Take 1 tablet daily except 0.5 tablet on Saturdays.  Recheck in 4 weeks.  Current Anticoagulation Instructions: INR 2.2  Continue same regimen of 1 tab daily except 1/2 tab on Saturday.  Re-check INR in 4 weeks.

## 2010-09-22 NOTE — Letter (Signed)
Summary: [facility]  [facility]   Imported By: Maryln Gottron 09/15/2009 12:35:26  _____________________________________________________________________  External Attachment:    Type:   Image     Comment:   External Document

## 2010-09-22 NOTE — Medication Information (Signed)
Summary: rov/jk  Anticoagulant Therapy  Managed by: Eda Keys, PharmD Supervising MD: Shirlee Latch MD, Jerri Glauser Indication 1: Atrial Fibrillation (ICD-427.31) Lab Used: [facility] Site: [facility] INR POC 2.5 INR RANGE 2 - 3  Dietary changes: no    Health status changes: no    Bleeding/hemorrhagic complications: no    Recent/future hospitalizations: no    Any changes in medication regimen? no    Recent/future dental: no  Any missed doses?: no       Is patient compliant with meds? yes       Allergies: 1)  ! Penicillin  Anticoagulation Management History:      The patient is taking warfarin and comes in today for a routine follow up visit.  Positive risk factors for bleeding include an age of 5 years or older and history of GI bleeding.  The bleeding index is 'intermediate risk'.  Positive CHADS2 values include History of HTN and Age > 35 years old.  The start date was 03/25/2007.  His last INR was 2.9.  Anticoagulation responsible provider: Shirlee Latch MD, Rishard Delange.  INR POC: 2.5.  Cuvette Lot#: [id number].  Exp: 06/2011.    Anticoagulation Management Assessment/Plan:      The patient's current anticoagulation dose is Coumadin 5 mg  tabs: Take as directed by coumadin clinic..  The target INR is 2 - 3.  The next INR is due 06/06/2010.  Anticoagulation instructions were given to patient.  Results were reviewed/authorized by Eda Keys, PharmD.  He was notified by Eda Keys.         Prior Anticoagulation Instructions: INR  2.9  Continue taking 1 tablet (5mg ) every day except take 1/2 tablet (2.5mg ) on Saturdays.  Recheck in 4 weeks.   Current Anticoagulation Instructions: INR 2.5  Continue taking 1/2 tablet on Saturday and 1 tablet all other days.  Return to clinic in 1 month.

## 2010-09-22 NOTE — Assessment & Plan Note (Signed)
Summary: 3 month//ccm   Vital Signs:  Patient profile:   77 year old male Height:      67 inches Weight:      158 pounds BMI:     24.84 Temp:     98.2 degrees F oral Pulse rate:   76 / minute Resp:     14 per minute BP sitting:   118 / 76  (left arm)  Vitals Entered By: Willy Eddy, LPN (July 11, 2010 8:08 AM) CC: roa labs, Lipid Management Is Patient Diabetic? No   Primary Care Provider:  Stacie Glaze MD  CC:  roa labs and Lipid Management.  History of Present Illness:  Hyperlipidemia Follow-Up      This is a 77 year old man who presents for Hyperlipidemia follow-up.  The patient denies muscle aches, GI upset, abdominal pain, flushing, itching, constipation, diarrhea, and fatigue.  The patient denies the following symptoms: chest pain/pressure, exercise intolerance, dypsnea, palpitations, syncope, and pedal edema.  Dietary compliance has been excellent.  The patient reports exercising 3-4X per week.  Adjunctive measures currently used by the patient include fish oil supplements and limiting alcohol consumpton.    Hypertension Follow-Up      The patient also presents for Hypertension follow-up.  The patient denies lightheadedness, urinary frequency, headaches, edema, impotence, rash, and fatigue.  The patient denies the following associated symptoms: chest pain, chest pressure, exercise intolerance, dyspnea, palpitations, syncope, leg edema, and pedal edema.  Compliance with medications (by patient report) has been near 100%.  The patient reports that dietary compliance has been good.  The patient reports exercising 3-4X per week.  Adjunctive measures currently used by the patient include relaxation.    Lipid Management History:      Positive NCEP/ATP III risk factors include male age 75 years old or older, hypertension, and ASHD (either angina/prior MI/prior CABG).  Negative NCEP/ATP III risk factors include non-tobacco-user status.     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) 3)  Actinic Keratosis, Head  (ICD-702.0) 4)  Coumadin Therapy  (ICD-V58.61) 5)  Appendectomy, Hx of  (ICD-V45.79) 6)  Degenerative Joint Disease  (ICD-715.90) 7)  Benign Prostatic Hypertrophy, Hx of  (ICD-V13.8) 8)  Herniorrhaphy, Hx of  (ICD-V45.89) 9)  Hip Replacement, Total, Hx of  (ICD-V43.64) 10)  Mitral Valve Replacement, Hx of  (ICD-V15.1) 11)  Syncope, Hx of  (ICD-V12.49) 12)  Anemia, Hx of  (ICD-285.9) 13)  Upper Gastrointestinal Hemorrhage, Hx of  (ICD-578.9) 14)  Epistaxis, Recurrent  (ICD-784.7) 15)  Uns Advrs Eff Uns Rx Medicinal&biological Sbstnc  (ICD-995.20) 16)  Atrial Flutter  (ICD-427.32) 17)  Other and Unspecified Mitral Valve Diseases  (ICD-394.9) 18)  Preventive Health Care  (ICD-V70.0) 19)  Family History of Colon Ca 1st Degree Relative <60  (ICD-V16.0) 20)  Family History of Cad Male 1st Degree Relative <60  (ICD-V16.49) 21)  Family History of Cad Male 1st Degree Relative <50  (ICD-V17.3) 22)  Peptic Ulcer Disease  (ICD-533.90) 23)  Benign Prostatic Hypertrophy  (ICD-600.00) 24)  Hypertension  (ICD-401.9) 25)  Transient Ischemic Attack, Hx of  (ICD-V12.50) 26)  Dvt, Hx of  (ICD-V12.51) 27)  Rosacea  (ICD-695.3) 28)  Coronary Artery Disease  (  ICD-414.00)  Current Problems (verified): 1)  Rotator Cuff Syndrome, Left  (ICD-726.10) 2)  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) 3)  Actinic Keratosis, Head  (ICD-702.0) 4)  Coumadin Therapy  (ICD-V58.61) 5)  Appendectomy, Hx of  (ICD-V45.79) 6)  Degenerative Joint Disease  (ICD-715.90) 7)  Benign Prostatic Hypertrophy, Hx of  (ICD-V13.8) 8)  Herniorrhaphy, Hx of  (ICD-V45.89) 9)  Hip Replacement, Total, Hx of   (ICD-V43.64) 10)  Mitral Valve Replacement, Hx of  (ICD-V15.1) 11)  Syncope, Hx of  (ICD-V12.49) 12)  Anemia, Hx of  (ICD-285.9) 13)  Upper Gastrointestinal Hemorrhage, Hx of  (ICD-578.9) 14)  Epistaxis, Recurrent  (ICD-784.7) 15)  Uns Advrs Eff Uns Rx Medicinal&biological Sbstnc  (ICD-995.20) 16)  Atrial Flutter  (ICD-427.32) 17)  Other and Unspecified Mitral Valve Diseases  (ICD-394.9) 18)  Preventive Health Care  (ICD-V70.0) 19)  Family History of Colon Ca 1st Degree Relative <60  (ICD-V16.0) 20)  Family History of Cad Male 1st Degree Relative <60  (ICD-V16.49) 21)  Family History of Cad Male 1st Degree Relative <50  (ICD-V17.3) 22)  Peptic Ulcer Disease  (ICD-533.90) 23)  Benign Prostatic Hypertrophy  (ICD-600.00) 24)  Hypertension  (ICD-401.9) 25)  Transient Ischemic Attack, Hx of  (ICD-V12.50) 26)  Dvt, Hx of  (ICD-V12.51) 27)  Rosacea  (ICD-695.3) 28)  Coronary Artery Disease  (ICD-414.00)  Medications Prior to Update: 1)  Benazepril-Hydrochlorothiazide 5-6.25 Mg Tabs (Benazepril-Hydrochlorothiazide) .Marland Kitchen.. 1 Once Daily 2)  Cardura 1 Mg  Tabs (Doxazosin Mesylate) .... Take 1 Tablet By Mouth Once A Day 3)  Proscar 5 Mg  Tabs (Finasteride) .... Take 1 Tablet By Mouth Once A Day 4)  Protonix 40 Mg  Tbec (Pantoprazole Sodium) .... Take 1 Tablet By Mouth Once A Day 5)  Metoprolol Succinate 25 Mg Xr24h-Tab (Metoprolol Succinate) .Marland Kitchen.. 1 in Am and 2 in Pm 6)  Coumadin 5 Mg  Tabs (Warfarin Sodium) .... Take As Directed By Coumadin Clinic. 7)  Alprazolam 0.25 Mg  Tabs (Alprazolam) .... One By Mouth Q Hs 8)  Doxycycline Hyclate 100 Mg Caps (Doxycycline Hyclate) .Marland Kitchen.. 1 Two Times A Day 9)  Clindamycin Hcl 150 Mg Caps (Clindamycin Hcl) .... Take 4 Caps 1 Hour Prior To Dental Work 10)  Terbinafine Hcl 250 Mg Tabs (Terbinafine Hcl) .... One By Mouth Daily  Current Medications (verified): 1)  Benazepril-Hydrochlorothiazide 5-6.25 Mg Tabs (Benazepril-Hydrochlorothiazide) .Marland Kitchen.. 1 Once Daily 2)   Cardura 1 Mg  Tabs (Doxazosin Mesylate) .... Take 1 Tablet By Mouth Once A Day 3)  Proscar 5 Mg  Tabs (Finasteride) .... Take 1 Tablet By Mouth Once A Day 4)  Protonix 40 Mg  Tbec (Pantoprazole Sodium) .... Take 1 Tablet By Mouth Once A Day 5)  Metoprolol Succinate 25 Mg Xr24h-Tab (Metoprolol Succinate) .Marland Kitchen.. 1 in Am and 2 in Pm 6)  Coumadin 5 Mg  Tabs (Warfarin Sodium) .... Take As Directed By Coumadin Clinic. 7)  Alprazolam 0.25 Mg  Tabs (Alprazolam) .... One By Mouth Q Hs 8)  Doxycycline Hyclate 100 Mg Caps (Doxycycline Hyclate) .Marland Kitchen.. 1 Two Times A Day 9)  Clindamycin Hcl 150 Mg Caps (Clindamycin Hcl) .... Take 4 Caps 1 Hour Prior To Dental Work  Allergies (verified): 1)  ! Penicillin  Past History:  Family History: Last updated: 03/06/2007 mother with severe Mitral valve dz died at age 24 Family History of CAD Male 1st degree relative <50 faher dieD mi AT AGE 26 Family History of Colon CA 1st degree relative <60 COUSIN  Family History of Prostate CA 1st degree relative AT 84 COUSIN  Social History: Last updated: 03/06/2007 Retired Married Former Smoker IN COLLEGE  Risk Factors: Smoking Status: quit (07/11/2010) Passive Smoke Exposure: no (07/11/2010)  Past medical, surgical, family and social histories (including risk factors) reviewed, and no changes noted (except as noted below).  Past Medical History: Reviewed history from 06/30/2009 and no changes required. DEGENERATIVE JOINT DISEASE (ICD-715.90) BENIGN PROSTATIC HYPERTROPHY, HX OF (ICD-V13.8) ANEMIA, HX OF (ICD-285.9) UPPER GASTROINTESTINAL HEMORRHAGE, HX OF (ICD-578.9) EPISTAXIS, RECURRENT (ICD-784.7) ATRIAL FLUTTER (ICD-427.32) BENIGN PROSTATIC HYPERTROPHY (ICD-600.00) HYPERTENSION (ICD-401.9) TRANSIENT ISCHEMIC ATTACK, HX OF (ICD-V12.50) DVT, HX OF (ICD-V12.51) ROSACEA (ICD-695.3)    Past Surgical History: Reviewed history from 06/30/2009 and no changes required. APPENDECTOMY, HX OF  (ICD-V45.79) HERNIORRHAPHY, HX OF (ICD-V45.89) HIP REPLACEMENT, TOTAL, HX OF (ICD-V43.64) history of mitral valve repair  Family History: Reviewed history from 03/06/2007 and no changes required. mother with severe Mitral valve dz died at age 69 Family History of CAD Male 1st degree relative <50 faher dieD mi AT AGE 66 Family History of Colon CA 1st degree relative <60 COUSIN Family History of Prostate CA 1st degree relative AT 74 COUSIN  Social History: Reviewed history from 03/06/2007 and no changes required. 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    Physical Exam  General:  Well-developed,well-nourished,in no acute distress; alert,appropriate and cooperative throughout examination Head:  normocephalic and atraumatic.  male-pattern balding.   Eyes:  pupils equal and pupils round.   Ears:  R ear normal and L ear normal.   Nose:  no external deformity and no nasal discharge.   Mouth:  pharynx pink and moist and no erythema.   Neck:  No deformities, masses, or tenderness noted. Lungs:  normal respiratory effort and no intercostal retractions.   Heart:  normal rate and regular rhythm.   2/6 systollic murmur no rub and no JVD.   Abdomen:  Bowel sounds positive,abdomen soft and non-tender without masses, organomegaly or hernias noted. Msk:  No deformity or scoliosis noted of thoracic or lumbar spine.   Pulses:  R and L carotid,radial,femoral,dorsalis pedis and  posterior tibial pulses are full and equal bilaterally Extremities:  No clubbing, cyanosis, edema, or deformity noted with normal full range of motion of all joints.   Neurologic:  alert & oriented X3 and gait normal.     Impression & Recommendations:  Problem # 1:  HYPERTENSION (ICD-401.9) stable His updated medication list for this problem includes:    Benazepril-hydrochlorothiazide 5-6.25 Mg Tabs (Benazepril-hydrochlorothiazide) .Marland Kitchen... 1 once daily    Cardura 1 Mg Tabs (Doxazosin mesylate) .Marland Kitchen... Take 1 tablet by mouth once a day    Metoprolol Succinate 25 Mg Xr24h-tab (Metoprolol succinate) .Marland Kitchen... 1 in am and 2 in pm  BP today: 118/76 Prior BP: 130/80 (03/07/2010)  Prior 10 Yr Risk Heart Disease: N/A (06/07/2007)  Labs Reviewed: K+: 3.8 (07/04/2010) Creat: : 0.7 (07/04/2010)   Chol: 164 (07/04/2010)   HDL: 46.90 (07/04/2010)   LDL: 100 (07/04/2010)   TG: 85.0 (07/04/2010)  Problem # 2:  CORONARY ARTERY DISEASE (ICD-414.00) no chanst pain His updated medication list for this problem  includes:    Benazepril-hydrochlorothiazide 5-6.25 Mg Tabs (Benazepril-hydrochlorothiazide) .Marland Kitchen... 1 once daily    Cardura 1 Mg Tabs (Doxazosin mesylate) .Marland Kitchen... Take 1 tablet by mouth once a day    Metoprolol Succinate 25 Mg Xr24h-tab (Metoprolol succinate) .Marland Kitchen... 1 in am and 2 in pm  Labs Reviewed: Chol: 164 (07/04/2010)   HDL: 46.90 (07/04/2010)   LDL: 100 (07/04/2010)   TG: 85.0 (07/04/2010)  Problem # 3:  ROSACEA (ICD-695.3) stable  Problem # 4:  BENIGN PROSTATIC HYPERTROPHY, HX OF (ICD-V13.8)  Complete Medication List: 1)  Benazepril-hydrochlorothiazide 5-6.25 Mg Tabs (Benazepril-hydrochlorothiazide) .Marland Kitchen.. 1 once daily 2)  Cardura 1 Mg Tabs (Doxazosin mesylate) .... Take 1 tablet by mouth once a day 3)  Proscar 5 Mg Tabs (Finasteride) .... Take 1 tablet by mouth once a day 4)  Protonix 40 Mg Tbec (Pantoprazole sodium) .... Take 1 tablet by mouth once a day 5)  Metoprolol Succinate 25 Mg  Xr24h-tab (Metoprolol succinate) .Marland Kitchen.. 1 in am and 2 in pm 6)  Coumadin 5 Mg Tabs (Warfarin sodium) .... Take as directed by coumadin clinic. 7)  Alprazolam 0.25 Mg Tabs (Alprazolam) .... One by mouth q hs 8)  Doxycycline Hyclate 100 Mg Caps (Doxycycline hyclate) .Marland Kitchen.. 1 two times a day 9)  Clindamycin Hcl 150 Mg Caps (Clindamycin hcl) .... Take 4 caps 1 hour prior to dental work  Lipid Assessment/Plan:      Based on NCEP/ATP III, the patient's risk factor category is "history of coronary disease, peripheral vascular disease, cerebrovascular disease, or aortic aneurysm".  The patient's lipid goals are as follows: Total cholesterol goal is 200; LDL cholesterol goal is 100; HDL cholesterol goal is 40; Triglyceride goal is 150.    Patient Instructions: 1)  Please schedule a follow-up appointment in 4 months. 2)  fasting in the AM for a medicare welness exam Prescriptions: ALPRAZOLAM 0.25 MG  TABS (ALPRAZOLAM) one by mouth q HS  #90 x 3   Entered and Authorized by:   Stacie Glaze MD   Signed by:   Stacie Glaze MD on 07/11/2010   Method used:   Print then Give to Patient   RxID:   [id number] COUMADIN 5 MG  TABS (WARFARIN SODIUM) Take as directed by coumadin clinic.  #100 x 3   Entered and Authorized by:   Stacie Glaze MD   Signed by:   Stacie Glaze MD on 07/11/2010   Method used:   Electronically to        [facility]. [phone]* (retail)       [address]       [place], [place]  [address]       Ph: [phone]       Fax: [phone]   RxID:   [id number] METOPROLOL SUCCINATE 25 MG XR24H-TAB (METOPROLOL SUCCINATE) 1 in am and 2 in pm  #270.0 Each x 2   Entered and Authorized by:   Stacie Glaze MD   Signed by:   Stacie Glaze MD on 07/11/2010   Method used:   Electronically to        [facility]. [phone]* (retail)       [address]       [place], [place]  [address]       Ph: [phone]       Fax: [phone]   RxID:   [id number] PROTONIX 40 MG   TBEC (PANTOPRAZOLE SODIUM) Take 1 tablet by mouth once a day  #90.0  Each x 3   Entered and Authorized by:   Stacie Glaze MD   Signed by:   Stacie Glaze MD on 07/11/2010   Method used:   Electronically to        [facility]. [phone]* (retail)       [address]       [place], [place]  [address]       Ph: [phone]       Fax: [phone]   RxID:   [id number] PROSCAR 5 MG  TABS (FINASTERIDE) Take 1 tablet by mouth once a day  #90 x 3   Entered and Authorized by:   Stacie Glaze MD   Signed by:   Stacie Glaze MD on 07/11/2010   Method used:   Electronically to        [facility]. [phone]* (retail)       [address]       [place], [place]  [address]       Ph: [phone]       Fax: [phone]   RxID:   [id number] CARDURA 1 MG  TABS (DOXAZOSIN MESYLATE) Take 1 tablet by mouth once a day  #90.0 Each x 3   Entered and Authorized by:   Stacie Glaze MD   Signed by:   Stacie Glaze MD on 07/11/2010   Method used:   Electronically to        [facility]. [phone]* (retail)       [address]       [place], [place]  [address]       Ph: [phone]       Fax: [phone]   RxID:   [id number] BENAZEPRIL-HYDROCHLOROTHIAZIDE 5-6.25 MG TABS (BENAZEPRIL-HYDROCHLOROTHIAZIDE) 1 once daily  #90 x 3   Entered and Authorized by:   Stacie Glaze MD   Signed by:   Stacie Glaze MD on 07/11/2010   Method used:   Electronically to        [facility]. [phone]* (retail)       [address]       [place], [place]  [address]       Ph: [phone]       Fax: [phone]   RxID:   [id number]    Orders Added: 1)  Est. Patient Level IV [539[address]]

## 2010-09-22 NOTE — Medication Information (Signed)
Summary: rov/sp  Anticoagulant Therapy  Managed by: Weston Brass, PharmD Supervising MD: Jens Som MD, Arlys John Indication 1: Atrial Fibrillation (ICD-427.31) Lab Used: [facility] Site: [facility] INR POC 2.3 INR RANGE 2 - 3  Dietary changes: no    Health status changes: no    Bleeding/hemorrhagic complications: no    Recent/future hospitalizations: no    Any changes in medication regimen? no    Recent/future dental: no  Any missed doses?: no       Is patient compliant with meds? yes       Allergies: 1)  ! Penicillin  Anticoagulation Management History:      The patient is taking warfarin and comes in today for a routine follow up visit.  Positive risk factors for bleeding include an age of 60 years or older and history of GI bleeding.  The bleeding index is 'intermediate risk'.  Positive CHADS2 values include History of HTN.  Negative CHADS2 values include Age > 66 years old.  The start date was 03/25/2007.  His last INR was 2.9.  Anticoagulation responsible provider: Jens Som MD, Arlys John.  INR POC: 2.3.  Cuvette Lot#: [id number].  Exp: 01/2011.    Anticoagulation Management Assessment/Plan:      The patient's current anticoagulation dose is Coumadin 5 mg  tabs: Take as directed by coumadin clinic..  The target INR is 2 - 3.  The next INR is due 01/10/2010.  Anticoagulation instructions were given to patient.  Results were reviewed/authorized by Weston Brass, PharmD.  He was notified by Weston Brass PharmD.         Prior Anticoagulation Instructions: INR 2.0  Continue same dose of 1 tablet every day except 1/2 tablet on Saturday   Current Anticoagulation Instructions: INR 2.3  Continue same dose of 1 tablet every day except 1/2 tablet on Saturday

## 2010-09-22 NOTE — Medication Information (Signed)
Summary: rov/eac  Anticoagulant Therapy  Managed by: Weston Brass, PharmD Supervising MD: Patty Sermons Indication 1: Atrial Fibrillation (ICD-427.31) Lab Used: [facility] Site: [facility] INR POC 2.3 INR RANGE 2 - 3  Dietary changes: no    Health status changes: no    Bleeding/hemorrhagic complications: no    Recent/future hospitalizations: no    Any changes in medication regimen? no    Recent/future dental: no  Any missed doses?: no       Is patient compliant with meds? yes       Allergies: 1)  ! Penicillin  Anticoagulation Management History:      The patient is taking warfarin and comes in today for a routine follow up visit.  Positive risk factors for bleeding include an age of 75 years or older and history of GI bleeding.  The bleeding index is 'intermediate risk'.  Positive CHADS2 values include History of HTN and Age > 75 years old.  The start date was 03/25/2007.  His last INR was 2.9.  Anticoagulation responsible Brian [name]  INR POC: 2.3.  Cuvette Lot#: [id number].  Exp: 06/2011.    Anticoagulation Management Assessment/Plan:      The patient's current anticoagulation dose is Coumadin 5 mg  tabs: Take as directed by coumadin clinic..  The target INR is 2 - 3.  The next INR is due 07/06/2010.  Anticoagulation instructions were given to patient.  Results were reviewed/authorized by Weston Brass, PharmD.  He was notified by Haynes Hoehn, PharmD Candidate.         Prior Anticoagulation Instructions: INR 2.5  Continue taking 1/2 tablet on Saturday and 1 tablet all other days.  Return to clinic in 1 month.      Current Anticoagulation Instructions: INR 2.3  Continue Coumadin as scheduled:  1 tablet every day of the week, except 1/2 tablet on Saturday.  Return to clinic in 4 weeks.

## 2010-09-22 NOTE — Medication Information (Signed)
Summary: rov/ewj      Allergies Added:  Anticoagulant Therapy  Managed by: Minerva Areola, RN Supervising MD: Eden Emms MD, Theron Arista Indication 1: Atrial Fibrillation (ICD-427.31) Lab Used: [facility] Site: [facility] INR POC 2.9 INR RANGE 2 - 3  Dietary changes: no    Health status changes: no    Bleeding/hemorrhagic complications: no    Recent/future hospitalizations: no    Any changes in medication regimen? no    Recent/future dental: no  Any missed doses?: no       Is patient compliant with meds? yes       Current Medications (verified): 1)  Benazepril-Hydrochlorothiazide 5-6.25 Mg Tabs (Benazepril-Hydrochlorothiazide) .Marland Kitchen.. 1 Once Daily 2)  Cardura 1 Mg  Tabs (Doxazosin Mesylate) .... Take 1 Tablet By Mouth Once A Day 3)  Proscar 5 Mg  Tabs (Finasteride) .... Take 1 Tablet By Mouth Once A Day 4)  Protonix 40 Mg  Tbec (Pantoprazole Sodium) .... Take 1 Tablet By Mouth Once A Day 5)  Metoprolol Succinate 25 Mg Xr24h-Tab (Metoprolol Succinate) .Marland Kitchen.. 1 in Am and 2 in Pm 6)  Coumadin 5 Mg  Tabs (Warfarin Sodium) .... Take As Directed By Coumadin Clinic. 7)  Alprazolam 0.25 Mg  Tabs (Alprazolam) .... One By Mouth Q Hs 8)  Doxycycline Monohydrate 100 Mg Tabs (Doxycycline Monohydrate) .Marland Kitchen.. 1 Two Times A Day 9)  Clindamycin Hcl 150 Mg Caps (Clindamycin Hcl) .... Take 4 Caps 1 Hour Prior To Dental Work  Allergies (verified): 1)  ! Penicillin  Anticoagulation Management History:      The patient is taking warfarin and comes in today for a routine follow up visit.  Positive risk factors for bleeding include an age of 75 years or older and history of GI bleeding.  The bleeding index is 'intermediate risk'.  Positive CHADS2 values include History of HTN.  Negative CHADS2 values include Age > 25 years old.  The start date was 03/25/2007.  His last INR was 2.8 and today's INR is 2.9.  Anticoagulation responsible provider: Eden Emms MD, Theron Arista.  INR POC: 2.9.  Cuvette Lot#: [id number].  Exp:  12/2010.    Anticoagulation Management Assessment/Plan:      The patient's current anticoagulation dose is Coumadin 5 mg  tabs: Take as directed by coumadin clinic..  The target INR is 2 - 3.  The next INR is due 11/19/2009.  Anticoagulation instructions were given to patient.  Results were reviewed/authorized by Minerva Areola, RN.  He was notified by Minerva Areola, RN, BSN.         Prior Anticoagulation Instructions: INR 3.5  Skip tomorrow's dosage of coumadin, then start taking 1 tablet daily except 1/2 tablet on Saturdays.  Recheck in 3 weeks.    Current Anticoagulation Instructions: The patient is to continue with the same dose of coumadin.  This dosage includes: 5 mg daily except 2.5mg  on Saturday.

## 2010-09-22 NOTE — Assessment & Plan Note (Signed)
Summary: 3 mo rov/mm   Vital Signs:  Patient profile:   77 year old male Height:      67 inches Weight:      156 pounds BMI:     24.52 Temp:     98.2 degrees F oral Pulse rate:   64 / minute Resp:     14 per minute BP sitting:   124 / 76  (left arm)  Vitals Entered By: Willy Eddy, LPN (September 06, 2009 8:07 AM) CC: roa- needs ? clindamycin for dental work-- states he take 6 100 mg ??-not listed, Hypertension Management   CC:  roa- needs ? clindamycin for dental work-- states he take 6 100 mg ??-not listed and Hypertension Management.  History of Present Illness: follow up CAD and valular dz    Follow-Up Visit      This is a 77 year old man who presents for Follow-up visit.  The patient denies chest pain, palpitations, dizziness, syncope, low blood sugar symptoms, high blood sugar symptoms, edema, SOB, DOE, PND, and orthopnea.  Since the last visit the patient notes no new problems or concerns.  The patient reports taking meds as prescribed.  When questioned about possible medication side effects, the patient notes none.    has rosacia but has two lesion on his face that appear to be transitional AK's No chest pain or SOB comadin has been well controlled has a darkening mole on his neck   Hypertension History:      He denies headache, chest pain, palpitations, dyspnea with exertion, orthopnea, PND, peripheral edema, visual symptoms, neurologic problems, syncope, and side effects from treatment.        Positive major cardiovascular risk factors include male age 33 years old or older and hypertension.  Negative major cardiovascular risk factors include non-tobacco-user status.        Positive history for target organ damage include ASHD (either angina/prior MI/prior CABG).     Preventive Screening-Counseling & Management  Alcohol-Tobacco     Smoking Status: quit     Passive Smoke Exposure: no  Problems Prior to Update: 1)  Coumadin Therapy  (ICD-V58.61) 2)   Appendectomy, Hx of  (ICD-V45.79) 3)  Degenerative Joint Disease  (ICD-715.90) 4)  Benign Prostatic Hypertrophy, Hx of  (ICD-V13.8) 5)  Herniorrhaphy, Hx of  (ICD-V45.89) 6)  Hip Replacement, Total, Hx of  (ICD-V43.64) 7)  Mitral Valve Replacement, Hx of  (ICD-V15.1) 8)  Syncope, Hx of  (ICD-V12.49) 9)  Anemia, Hx of  (ICD-285.9) 10)  Upper Gastrointestinal Hemorrhage, Hx of  (ICD-578.9) 11)  Epistaxis, Recurrent  (ICD-784.7) 12)  Uns Advrs Eff Uns Rx Medicinal&biological Sbstnc  (ICD-995.20) 13)  Atrial Flutter  (ICD-427.32) 14)  Other and Unspecified Mitral Valve Diseases  (ICD-394.9) 15)  Preventive Health Care  (ICD-V70.0) 16)  Family History of Colon Ca 1st Degree Relative <60  (ICD-V16.0) 17)  Family History of Cad Male 1st Degree Relative <60  (ICD-V16.49) 18)  Family History of Cad Male 1st Degree Relative <50  (ICD-V17.3) 19)  Peptic Ulcer Disease  (ICD-533.90) 20)  Benign Prostatic Hypertrophy  (ICD-600.00) 21)  Hypertension  (ICD-401.9) 22)  Transient Ischemic Attack, Hx of  (ICD-V12.50) 23)  Dvt, Hx of  (ICD-V12.51) 24)  Rosacea  (ICD-695.3) 25)  Coronary Artery Disease  (ICD-414.00)  Medications Prior to Update: 1)  Lotensin Hct 5-6.25 Mg  Tabs (Benazepril-Hydrochlorothiazide) .... Take 1 Tablet By Mouth Once A Day 2)  Cardura 1 Mg  Tabs (Doxazosin Mesylate) .... Take  1 Tablet By Mouth Once A Day 3)  Proscar 5 Mg  Tabs (Finasteride) .... Take 1 Tablet By Mouth Once A Day 4)  Protonix 40 Mg  Tbec (Pantoprazole Sodium) .... Take 1 Tablet By Mouth Once A Day 5)  Metoprolol Succinate 25 Mg Xr24h-Tab (Metoprolol Succinate) .Marland Kitchen.. 1 in Am and 2 in Pm 6)  Coumadin 5 Mg  Tabs (Warfarin Sodium) .... Take As Directed By Coumadin Clinic. 7)  Alprazolam 0.25 Mg  Tabs (Alprazolam) .... One By Mouth Q Hs  Current Medications (verified): 1)  Benazepril-Hydrochlorothiazide 5-6.25 Mg Tabs (Benazepril-Hydrochlorothiazide) .Marland Kitchen.. 1 Once Daily 2)  Cardura 1 Mg  Tabs (Doxazosin Mesylate)  .... Take 1 Tablet By Mouth Once A Day 3)  Proscar 5 Mg  Tabs (Finasteride) .... Take 1 Tablet By Mouth Once A Day 4)  Protonix 40 Mg  Tbec (Pantoprazole Sodium) .... Take 1 Tablet By Mouth Once A Day 5)  Metoprolol Succinate 25 Mg Xr24h-Tab (Metoprolol Succinate) .Marland Kitchen.. 1 in Am and 2 in Pm 6)  Coumadin 5 Mg  Tabs (Warfarin Sodium) .... Take As Directed By Coumadin Clinic. 7)  Alprazolam 0.25 Mg  Tabs (Alprazolam) .... One By Mouth Q Hs 8)  Doxycycline Monohydrate 100 Mg Tabs (Doxycycline Monohydrate) .Marland Kitchen.. 1 Two Times A Day 9)  Clindamycin Hcl 150 Mg Caps (Clindamycin Hcl) .... Take 6 Caps 1 Ho Ur Prior To Dental Work  Allergies (verified): 1)  ! Penicillin  Past History:  Family History: Last updated: 03/06/2007 mother with severe Mitral valve dz died at age 75 Family History of CAD Male 1st degree relative <50 faher dieD mi AT AGE 48 Family History of Colon CA 1st degree relative <60 COUSIN Family History of Prostate CA 1st degree relative AT 37 COUSIN  Social History: Last updated: 03/06/2007 Retired Married Former Smoker IN COLLEGE  Risk Factors: Smoking Status: quit (09/06/2009) Passive Smoke Exposure: no (09/06/2009)  Past medical, surgical, family and social histories (including risk factors) reviewed, and no changes noted (except as noted below).  Past Medical History: Reviewed history from 06/30/2009 and no changes required. DEGENERATIVE JOINT DISEASE (ICD-715.90) BENIGN PROSTATIC HYPERTROPHY, HX OF (ICD-V13.8) ANEMIA, HX OF (ICD-285.9) UPPER GASTROINTESTINAL HEMORRHAGE, HX OF (ICD-578.9) EPISTAXIS, RECURRENT (ICD-784.7) ATRIAL FLUTTER (ICD-427.32) BENIGN PROSTATIC HYPERTROPHY (ICD-600.00) HYPERTENSION (ICD-401.9) TRANSIENT ISCHEMIC ATTACK, HX OF (ICD-V12.50) DVT, HX OF (ICD-V12.51) ROSACEA (ICD-695.3)    Past Surgical History: Reviewed history from 06/30/2009 and no changes required. APPENDECTOMY, HX OF (ICD-V45.79) HERNIORRHAPHY, HX OF (ICD-V45.89) HIP  REPLACEMENT, TOTAL, HX OF (ICD-V43.64) history of mitral valve repair  Family History: Reviewed history from 03/06/2007 and no changes required. mother with severe Mitral valve dz died at age 30 Family History of CAD Male 1st degree relative <50 faher dieD mi AT AGE 63 Family History of Colon CA 1st degree relative <60 COUSIN Family History of Prostate CA 1st degree relative AT 45 COUSIN  Social History: Reviewed history from 03/06/2007 and no changes required. 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    Physical Exam  General:  Well-developed,well-nourished,in no acute distress; alert,appropriate and cooperative throughout examination Head:  normocephalic and atraumatic.  male-pattern balding.  Eyes:  pupils equal and pupils round.   Ears:  R ear normal and L ear normal.   Nose:  no external deformity and no nasal discharge.   Mouth:  pharynx pink and moist and no erythema.   Neck:  No deformities, masses, or tenderness noted. Lungs:  normal respiratory effort and no intercostal retractions.   Heart:  normal rate and regular rhythm.   2/6 systollic murmur no rub and no JVD.     Impression & Recommendations:  Problem # 1:  ATRIAL FLUTTER (ICD-427.32) rate stable today His updated medication list for this problem includes:    Metoprolol Succinate 25 Mg Xr24h-tab (Metoprolol succinate) .Marland Kitchen... 1 in am and 2 in pm    Coumadin 5 Mg Tabs (Warfarin sodium) .Marland Kitchen... Take  as directed by coumadin clinic.  Reviewed the following: PT: 20.1 (05/27/2009)   INR: 2.8 (05/27/2009) Coumadin Dose (weekly): 32.50 mg (09/03/2009) Prior Coumadin Dose (weekly): 32.50 mg (09/03/2009) Next Protime: 10/01/2009 (dated on 09/03/2009)  Problem # 2:  CORONARY ARTERY DISEASE (ICD-414.00) no chest pain His updated medication list for this problem includes:    Benazepril-hydrochlorothiazide 5-6.25 Mg Tabs (Benazepril-hydrochlorothiazide) .Marland Kitchen... 1 once daily    Cardura 1 Mg Tabs (Doxazosin mesylate) .Marland Kitchen... Take 1 tablet by mouth once a day    Metoprolol Succinate 25 Mg Xr24h-tab (Metoprolol succinate) .Marland Kitchen... 1 in am and 2 in pm  Problem # 3:  OTHER AND UNSPECIFIED MITRAL VALVE DISEASES (ICD-394.9)  status post valve repair His updated medication list for this problem includes:    Metoprolol Succinate 25 Mg Xr24h-tab (Metoprolol succinate) .Marland Kitchen... 1 in am and 2 in pm    Coumadin 5 Mg Tabs (Warfarin sodium) .Marland Kitchen... Take as directed by coumadin clinic.  Echocardiogram:  SUMMARY   -  Overall left ventricular systolic function was normal. Left         ventricular ejection fraction was estimated to be 60 %. There         were no left ventricular regional wall motion abnormalities.   -  There was mild aortic valvular regurgitation.   -  The mitral ring and the native mitral valve are working well.         There was a Medtr[employer] ring. Mean transmitral gradient was 4         mmHg. Mitral valve area by pressure half-time was 2.14 cm^2.   -  The left atrium was mildly dilated.     ---------------------------------------------------------------    Prepared and Electronically Authenticated by    Willa Rough M.D. (05/16/2007)  Problem # 4:  HYPERTENSION (ICD-401.9) discussion of  geneics and controlled His updated medication list for this problem includes:    Benazepril-hydrochlorothiazide 5-6.25 Mg Tabs (Benazepril-hydrochlorothiazide) .Marland Kitchen... 1 once daily    Cardura 1 Mg Tabs (Doxazosin  mesylate) .Marland Kitchen... Take 1 tablet by mouth once a day    Metoprolol Succinate 25 Mg Xr24h-tab (Metoprolol succinate) .Marland Kitchen... 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 The pt tolerated the procedure and post procedure care was discussed  Orders: Cryotherapy/Destruction benign or premalignant lesion (1st lesion)  (17000) Cryotherapy/Destruction benign or premalignant lesion (2nd-14th lesions) (17003)  Complete Medication List: 1)  Benazepril-hydrochlorothiazide 5-6.25 Mg Tabs (Benazepril-hydrochlorothiazide) .Marland Kitchen.. 1 once daily 2)  Cardura 1 Mg Tabs (Doxazosin mesylate) .... Take 1 tablet by mouth once a day 3)  Proscar 5 Mg Tabs (Finasteride) .... Take 1 tablet by mouth once a day 4)  Protonix 40 Mg Tbec (Pantoprazole sodium) .... Take 1 tablet by mouth once a day 5)  Metoprolol Succinate 25 Mg Xr24h-tab (Metoprolol succinate) .Marland Kitchen.. 1 in am and 2 in pm 6)  Coumadin 5 Mg Tabs (Warfarin sodium) .... Take as directed by coumadin clinic. 7)  Alprazolam 0.25 Mg Tabs (Alprazolam) .... One by mouth q hs 8)  Doxycycline Monohydrate 100 Mg Tabs (Doxycycline monohydrate) .Marland Kitchen.. 1 two times a day 9)  Clindamycin Hcl 150 Mg Caps (Clindamycin hcl) .... Take 4 caps 1 hour prior to dental work  Hypertension Assessment/Plan:      The patient's hypertensive risk group is category C: Target organ damage and/or diabetes.  Today's blood pressure is 124/76.  His blood pressure goal is < 140/90.  Patient  Instructions: 1)  Please schedule a follow-up appointment in 1 month. 2)  Mole removal to mole on right neck that has darkened Prescriptions: CLINDAMYCIN HCL 150 MG CAPS (CLINDAMYCIN HCL) take 4 caps 1 hour prior to dental work  #4 x 3   Entered and Authorized by:   Stacie Glaze MD   Signed by:   Stacie Glaze MD on 09/06/2009   Method used:   Electronically to        [facility]. [phone]* (retail)       [address]       [place], [place]  [address]       Ph: [phone]       Fax: [phone]   RxID:   [id number] CLINDAMYCIN HCL 150 MG CAPS (CLINDAMYCIN HCL) take 6 caps 1 ho ur prior to dental work  #6 x 3   Entered by:   Willy Eddy, LPN   Authorized by:   Stacie Glaze MD   Signed by:   Willy Eddy, LPN on 44/08/270   Method used:   Electronically to        [facility]. [phone]* (retail)       [address]       [place], [place]  [address]       Ph: [phone]       Fax: [phone]   RxID:   [id number] BENAZEPRIL-HYDROCHLOROTHIAZIDE 5-6.25 MG TABS (BENAZEPRIL-HYDROCHLOROTHIAZIDE) 1 once daily  #30 x 6   Entered by:   Willy Eddy, LPN   Authorized by:   Stacie Glaze MD   Signed by:   Willy Eddy, LPN on 35/57/3220   Method used:   Electronically to        [facility]. [phone]* (retail)       [address]       [place], [place]  [address]       Ph: [phone]       Fax: [phone]   RxID:   [id number] DOXYCYCLINE MONOHYDRATE 100 MG TABS (DOXYCYCLINE MONOHYDRATE) 1 two times a day  #60 x 6   Entered by:  Willy Eddy, LPN   Authorized by:   Stacie Glaze MD   Signed by:   Willy Eddy, LPN on 16/05/9603   Method used:   Electronically to        [facility]. [phone]* (retail)       [address]       [place], [place]  [address]       Ph: [phone]       Fax: [phone]   RxID:   [id number]

## 2010-09-22 NOTE — Medication Information (Signed)
Summary: rov.mp  Anticoagulant Therapy  Managed by: Shelby Dubin, PharmD, BCPS, CPP Supervising MD: Jens Som MD, Arlys John Indication 1: Atrial Fibrillation (ICD-427.31) Lab Used: [facility]  Site: [facility] INR POC 3.0 INR RANGE 2 - 3  Dietary changes: no    Health status changes: no    Bleeding/hemorrhagic complications: no    Recent/future hospitalizations: no    Any changes in medication regimen? no    Recent/future dental: no  Any missed doses?: no       Is patient compliant with meds? yes       Allergies (verified): 1)  ! Penicillin  Anticoagulation Management History:      The patient is taking warfarin and comes in today for a routine follow up visit.  Positive risk factors for bleeding include an age of 75 years or older and history of GI bleeding.  The bleeding index is 'intermediate risk'.  Positive CHADS2 values include History of HTN.  Negative CHADS2 values include Age > 75 years old.  The start date was 03/25/2007.  His last INR was 2.8.  Anticoagulation responsible provider: Jens Som MD, Arlys John.  INR POC: 3.0.  Cuvette Lot#: [id number].  Exp: 11/2010.    Anticoagulation Management Assessment/Plan:      The patient's current anticoagulation dose is Coumadin 5 mg  tabs: Take as directed by coumadin clinic..  The target INR is 2 - 3.  The next INR is due 10/01/2009.  Anticoagulation instructions were given to patient.  Results were reviewed/authorized by Shelby Dubin, PharmD, BCPS, CPP.  He was notified by Shelby Dubin PharmD, BCPS, CPP.         Current Anticoagulation Instructions: INR 3.0  Take 0.5 tab each Saturday and 1 tab on all other days.  Recheck in 4 weeks.

## 2010-10-04 DIAGNOSIS — I4892 Unspecified atrial flutter: Secondary | ICD-10-CM

## 2010-10-05 ENCOUNTER — Encounter: Payer: Self-pay | Admitting: Cardiology

## 2010-10-05 ENCOUNTER — Encounter (INDEPENDENT_AMBULATORY_CARE_PROVIDER_SITE_OTHER): Payer: Medicare Other

## 2010-10-05 DIAGNOSIS — I4891 Unspecified atrial fibrillation: Secondary | ICD-10-CM

## 2010-10-05 DIAGNOSIS — Z7901 Long term (current) use of anticoagulants: Secondary | ICD-10-CM

## 2010-10-11 ENCOUNTER — Other Ambulatory Visit: Payer: Self-pay | Admitting: Internal Medicine

## 2010-10-12 NOTE — Medication Information (Signed)
Summary: Coumadin Clinic   Anticoagulant Therapy  Managed by: Weston Brass, PharmD Referring MD: Jens Som MD, Arlys John PCP: Stacie Glaze MD Supervising MD: Shirlee Latch MD, Toia Micale Indication 1: Atrial Fibrillation (ICD-427.31) Lab Used: [facility] Site: [facility] INR POC 3.1 INR RANGE 2 - 3  Dietary changes: no    Health status changes: no    Bleeding/hemorrhagic complications: no    Recent/future hospitalizations: no    Any changes in medication regimen? no    Recent/future dental: no  Any missed doses?: no       Is patient compliant with meds? yes       Allergies: 1)  ! Penicillin  Anticoagulation Management History:      The patient is taking warfarin and comes in today for a routine follow up visit.  Positive risk factors for bleeding include an age of 75 years or older and history of GI bleeding.  The bleeding index is 'intermediate risk'.  Positive CHADS2 values include History of HTN and Age > 20 years old.  The start date was 03/25/2007.  His last INR was 3.2.  Anticoagulation responsible provider: Shirlee Latch MD, Aniqua Briere.  INR POC: 3.1.  Cuvette Lot#: [id number].  Exp: 08/2011.    Anticoagulation Management Assessment/Plan:      The patient's current anticoagulation dose is Coumadin 5 mg  tabs: Take as directed by coumadin clinic..  The target INR is 2 - 3.  The next INR is due 11/02/2010.  Anticoagulation instructions were given to patient.  Results were reviewed/authorized by Weston Brass, PharmD.  He was notified by Margot Chimes PharmD Candidate.         Prior Anticoagulation Instructions: INR 2.6 Continue 1 tablet everyday except 1/2  tablets on Saturdays. Recheck in 4 weeks.   Current Anticoagulation Instructions: INR 3.1  Only take 1/2 tablet tomorrow then resume your normal dose of 1 tablet everyday except 1/2 tablet on Saturday.  Recheck INR in 4 weeks.

## 2010-11-02 ENCOUNTER — Encounter: Payer: Self-pay | Admitting: Cardiology

## 2010-11-02 ENCOUNTER — Encounter (INDEPENDENT_AMBULATORY_CARE_PROVIDER_SITE_OTHER): Payer: Medicare Other

## 2010-11-02 DIAGNOSIS — Z7901 Long term (current) use of anticoagulants: Secondary | ICD-10-CM

## 2010-11-02 DIAGNOSIS — I4891 Unspecified atrial fibrillation: Secondary | ICD-10-CM

## 2010-11-02 LAB — CONVERTED CEMR LAB: POC INR: 2.5

## 2010-11-08 NOTE — Medication Information (Signed)
Summary: rov/ewj      Allergies Added:  Anticoagulant Therapy  Managed by: [name] Crimes, PharmD Referring MD: Jens Som MD, Arlys John PCP: Stacie Glaze MD Supervising MD: Shirlee Latch MD, Terissa Haffey Indication 1: Atrial Fibrillation (ICD-427.31) Lab Used: [facility] [place] Site: [facility] INR POC 2.5 INR RANGE 2 - 3  Dietary changes: no    Health status changes: no    Bleeding/hemorrhagic complications: no    Recent/future hospitalizations: no    Any changes in medication regimen? no    Recent/future dental: no  Any missed doses?: no       Is patient compliant with meds? yes       Current Medications (verified): 1)  Benazepril-Hydrochlorothiazide 5-6.25 Mg Tabs (Benazepril-Hydrochlorothiazide) .Marland Kitchen.. 1 Once Daily 2)  Cardura 1 Mg  Tabs (Doxazosin Mesylate) .... Take 1 Tablet By Mouth Once A Day 3)  Proscar 5 Mg  Tabs (Finasteride) .... Take 1 Tablet By Mouth Once A Day 4)  Protonix 40 Mg  Tbec (Pantoprazole Sodium) .... Take 1 Tablet By Mouth Once A Day 5)  Metoprolol Succinate 25 Mg Xr24h-Tab (Metoprolol Succinate) .Marland Kitchen.. 1 in Am and 2 in Pm 6)  Coumadin 5 Mg  Tabs (Warfarin Sodium) .... Take As Directed By Coumadin Clinic. 7)  Alprazolam 0.25 Mg  Tabs (Alprazolam) .... One By Mouth Q Hs 8)  Doxycycline Hyclate 100 Mg Caps (Doxycycline Hyclate) .Marland Kitchen.. 1 Two Times A Day 9)  Clindamycin Hcl 150 Mg Caps (Clindamycin Hcl) .... Take 4 Caps 1 Hour Prior To Dental Work 10)  Glucosamine-Chondroitin   Caps (Glucosamine-Chondroit-Vit C-Mn) .Marland Kitchen.. 1 Tab By Mouth Once Daily 11)  Metoprolol Tartrate 25 Mg Tabs (Metoprolol Tartrate) .... Take One Tablet Every Am and 2 Tablets Every Pm  Allergies (verified): 1)  ! Penicillin  Anticoagulation Management History:      Positive risk factors for bleeding include an age of 75 years or older and history of GI bleeding.  The bleeding index is 'intermediate risk'.  Positive CHADS2 values include History of HTN and Age > 64 years old.  The start date was  03/25/2007.  His last INR was 3.2.  Anticoagulation responsible provider: Shirlee Latch MD, Tuck Dulworth.  INR POC: 2.5.  Exp: 08/2011.    Anticoagulation Management Assessment/Plan:      The patient's current anticoagulation dose is Coumadin 5 mg  tabs: Take as directed by coumadin clinic..  The target INR is 2 - 3.  The next INR is due 11/30/2010.  Anticoagulation instructions were given to patient.  Results were reviewed/authorized by [name] Crimes, PharmD.         Prior Anticoagulation Instructions: INR 3.1  Only take 1/2 tablet tomorrow then resume your normal dose of 1 tablet everyday except 1/2 tablet on Saturday.  Recheck INR in 4 weeks.    Current Anticoagulation Instructions: Return to clinic in 4 weeks Cont with current regimen

## 2010-11-15 ENCOUNTER — Other Ambulatory Visit (HOSPITAL_COMMUNITY): Payer: Self-pay | Admitting: Cardiology

## 2010-11-15 DIAGNOSIS — I059 Rheumatic mitral valve disease, unspecified: Secondary | ICD-10-CM

## 2010-11-16 ENCOUNTER — Ambulatory Visit (HOSPITAL_COMMUNITY): Payer: Medicare Other | Attending: Cardiology | Admitting: Radiology

## 2010-11-16 DIAGNOSIS — I059 Rheumatic mitral valve disease, unspecified: Secondary | ICD-10-CM

## 2010-11-28 ENCOUNTER — Telehealth: Payer: Self-pay | Admitting: Cardiology

## 2010-11-28 NOTE — Telephone Encounter (Signed)
Pulled pt's echo results, paper copy was in Dr Ludwig Clarks folder, per pt's wife best to call him at his store at [phone], per employee pt is unavailable Left message to call back

## 2010-11-29 NOTE — Telephone Encounter (Signed)
Spoke with pt, he is aware of prelimary echo report and will place copy at the front desk for the pt to pick up tomorrow. Will confirm results with pt after dr Jens Som has seen Brian [name]

## 2010-11-30 ENCOUNTER — Ambulatory Visit (INDEPENDENT_AMBULATORY_CARE_PROVIDER_SITE_OTHER): Payer: Medicare Other | Admitting: *Deleted

## 2010-11-30 DIAGNOSIS — I4892 Unspecified atrial flutter: Secondary | ICD-10-CM

## 2010-11-30 DIAGNOSIS — Z7901 Long term (current) use of anticoagulants: Secondary | ICD-10-CM

## 2010-11-30 LAB — POCT INR: INR: 2.5

## 2010-12-28 ENCOUNTER — Ambulatory Visit (INDEPENDENT_AMBULATORY_CARE_PROVIDER_SITE_OTHER): Payer: Medicare Other | Admitting: *Deleted

## 2010-12-28 DIAGNOSIS — I4892 Unspecified atrial flutter: Secondary | ICD-10-CM

## 2010-12-28 LAB — POCT INR: INR: 3

## 2011-01-01 ENCOUNTER — Other Ambulatory Visit: Payer: Self-pay | Admitting: Internal Medicine

## 2011-01-02 ENCOUNTER — Other Ambulatory Visit: Payer: Self-pay | Admitting: Internal Medicine

## 2011-01-03 NOTE — Assessment & Plan Note (Signed)
[facility]                            CARDIOLOGY OFFICE NOTE   ETHON, WYMER                       MRN:          [id number]  DATE:04/18/2007                            DOB:          1933/03/04    Mr. Minshall is a pleasant gentleman who has a history of mitral valve  repair and atrial flutter. He did not have his echocardiogram when I saw  him in October of 2007. He denies any dyspnea on exertion, orthopnea,  PND, pre syncope, syncope, or exertional chest pain. He did have an  episode of atrial arrhythmia 8 months ago by his report. He was placed  on Toprol and Coumadin by Dr. Lovell Sheehan and has had no recurrences. His  medications include:  1. Cardura 1 mg p.o. daily.  2. Proscar 5 mg p.o. daily.  3. Protonix 40 mg p.o. daily.  4. Lotensin/hydrochlorothiazide 5/6.25 mg p.o. daily.  5. Toprol b.i.d.  6. Multivitamin.  7. Glucosamine.  8. Coumadin.   PHYSICAL EXAMINATION:  Today shows a blood pressure of 123/69, his pulse  is 65.  HEENT: Normal.  NECK: Supple.  CHEST: Clear.  CARDIOVASCULAR EXAM: Reveals a regular rate and rhythm.  ABDOMINAL EXAM: Benign.  EXTREMITIES: Show no edema.   Electrocardiogram today shows a sinus rhythm at a rate of 67. There is a  first degree AV block. There is left axis deviation. There is left  ventricular hypertrophy. There are no ST changes noted. There is poor R  wave progression. He did have hemoglobin, hematocrit on November 30, 2006  that showed a hemoglobin of 15 and a hematocrit of 44.   DIAGNOSES:  1. History of mitral valve repair- we will schedule an echocardiogram      to reassess this.  2. History of atrial flutter- he will continue on his Toprol and      Coumadin with goal INR of 2 to 3.  3. History of DVT.  4. History of transient ischemic attack and cerebral vascular accident      following mitral valve repair.  5. Hypertension- his blood pressure is well controlled on his present  medications.   He will continue with SBE prophylaxis and we will see him back in 12  months.    Madolyn Frieze Jens Som, MD, [facility]  Electronically Signed   BSC/MedQ  DD: 04/18/2007  DT: 04/18/2007  Job #: [id number]

## 2011-01-03 NOTE — Assessment & Plan Note (Signed)
[facility]                            CARDIOLOGY OFFICE NOTE   Brian [name], Brian [name]                       MRN:          [id number]  DATE:05/21/2008                            DOB:          03/26/1933    Brian [name] is a pleasant gentleman who has a history of mitral valve  repair as well as atrial flutter.  His last echocardiogram was performed  on May 16, 2007.  At that time, he had normal LV function.  There  is mild aortic insufficiency.  There is trivial mitral regurgitation,  but no evidence of mitral stenosis.  Left atrium is mildly dilated.  Since I last saw him, he is doing well from symptomatic standpoint.  He  denies any dyspnea, chest pain, palpitations, or syncope.  He has had  recent cataract surgery and also recent root canal.  He is otherwise  doing well.   MEDICATIONS:  1. Cardura 1 mg p.o. daily.  2. Proscar 5 mg p.o. daily.  3. Protonix 40 mg p.o. daily.  4. Lotensin/HCT 5/6.25 mg p.o. daily.  5. Multivitamin.  6. Glucosamine.  7. Coumadin.  8. Toprol.  9. Doxycycline.   PHYSICAL EXAMINATION:  VITAL SIGNS:  Blood pressure of 116/68 and his  pulse is 67.  He weighs 160 pounds.  HEENT:  Normal.  NECK:  Supple.  There are no bruits noted.  CHEST:  Clear.  CARDIOVASCULAR:  Regular rate.  ABDOMEN:  Shows no tenderness.  EXTREMITIES:  Show no edema.   His electrocardiogram shows a sinus rhythm with a first-degree AV block.  There is left anterior fascicular block.  There is left ventricular  hypertrophy.  Prior septal infarct cannot be excluded.   DIAGNOSES:  1. History of mitral valve repair - the patient is doing well from a      symptomatic standpoint.  We will make no changes.  He will continue      with the SBE prophylaxis.  2. History of atrial flutter - he remains in sinus rhythm.  He will      continue on his Toprol and Coumadin with a goal INR of 2-3.  3. History of deep venous thrombosis.  4. History of  transient ischemic attack and cerebrovascular accident      following mitral valve repair.  5. Coumadin therapy - this is being managed in our Coumadin [facility].      Note, Dr. Lovell Sheehan is following all of his laboratories including      CBC by his report.  6. Hypertension - his blood pressure is adequately controlled.   We will see him back in 12 months.     Madolyn Frieze Jens Som, MD, [facility]  Electronically Signed    BSC/MedQ  DD: 05/21/2008  DT: 05/21/2008  Job #: [id number]

## 2011-01-06 NOTE — Assessment & Plan Note (Signed)
[facility]                              CARDIOLOGY OFFICE NOTE   Brian [name], Brian [name]                       MRN:          [id number]  DATE:06/07/2006                            DOB:          [birth year]    Brian [name] is a very pleasant gentleman who has a history of mitral valve  repair, atrial flutter.  Since I last saw him, he is doing extremely well.  He denies any dyspnea on exertion, orthopnea, PND, pedal edema, presyncope,  syncope, or chest pain.  He occasionally has brief palpitations, which are  unchanged.   MEDICATIONS:  1. Cardura 1 mg p.o. daily.  2. Proscar 5 mg p.o. q. day.  3. Doxycycline 100 mg p.o. b.i.d.  4. Protonix 40 mg p.o. q. day.  5. Toprol 12.5 mg p.o. b.i.d.  6. Lotensin HCT 5/6.25 mg p.o. q. day.  7. Aspirin 81 mg p.o. b.i.d.  8. Multivitamin.  9. Glucosamine.   PHYSICAL EXAMINATION:  Blood pressure of 143/81.  His pulse is 72.  NECK:  Supple and there are no bruits noted.  CHEST:  Clear.  CARDIOVASCULAR:  Regular rate and rhythm.  I cannot appreciate a systolic or  diastolic murmur.  ABDOMEN:  No pulsatile masses.  No bruits.  EXTREMITIES:  No edema.   His electrocardiogram shows sinus rhythm with a first degree AV block.  There is left axis deviation.   DIAGNOSES:  1. History of atrial flutter, holding sinus rhythm.  2. Status post mitral valve repair.  3. History of deep venous thrombosis.  4. History of transient ischemic attack and cerebrovascular accident      following mitral valve repair.  5. Hypertension.   PLAN:  Brian [name] is doing well from a symptomatic standpoint.  We will  continue with his present medications.  His blood pressure is mildly  elevated today but he states it runs in the 120/60 range at home.  We will  therefore not make any changes.  He does have a history of atrial flutter  and has seen Dr. Graciela Husbands in the past for ablation but he has declined this at  this point unless he has  further problems.  I have recommended Coumadin in  the past, but he does not wish to proceed with this, and he understands the  risk of an embolic event.  I have asked him to change his aspirin to 325 mg  p.o. q. day to reduce the risk of an embolic event.  We will schedule him to  have an echocardiogram to re-assess his mitral valve repair.  He will  continue with SBE prophylaxis and I will see him back in 12 months.            ______________________________  Madolyn Frieze. Jens Som, MD, [facility]     BSC/MedQ  DD:  06/07/2006  DT:  06/08/2006  Job #:  [id number]

## 2011-01-06 NOTE — Discharge Summary (Signed)
[place]  Patient:    LEROI, HAQUE                       MRN: [id number] Adm. Date:  [id number] Disc. Date: 09/20/00 Attending:  Armanda Heritage Dictator:   Cornell Barman, P.A. CC:         Venita Lick. Pleas Koch., M.D. [facility]             Stacie Glaze, M.D. [facility]                           Discharge Summary  DISCHARGE DIAGNOSES: 1. Upper gastrointestinal bleed. 2. Anemia. 3. Syncope. 4. Pyloric ulcer.  BRIEF ADMISSION HISTORY:  Mr. Mckibben is a 77 year old white male who apparently had a syncopal episode while en route from [place], [place] to [place].  The patient was pumping gas in [place].  It is not clear how he was able to arrive in [place], but he then later did note some port wine colored stool.  On arrival to the emergency room, his hemoglobin was 8.7.  PAST MEDICAL HISTORY: 1. Mitral repair in 1996. 2. Status post total hip replacement. 3. Status post ventral hernia repair. 4. BPH. 5. Degenerative joint disease. 6. History of lower extremity DVT in 1997. 7. History of CVA. 8. Status post appendectomy. 9. Questionable history of diverticulosis by flexible sigmoidoscopy in 1996.  HOSPITAL COURSE: #1 - ANEMIA LIKELY SECONDARY TO AN UPPER GASTROINTESTINAL BLEED:  The patient was transfused 2 units of packed red blood cells.  #2 - GASTROINTESTINAL:  Likely GI bleed with excessive use of aspirin.  The patient underwent endoscopy on September 17, 2000.  This revealed an ulcer in the duodenal bulb with active bleeding.  The ulcer was washed with water.  It was injected with epinephrine.  The patient also had a biopsy which was CLOtest positive.  The patient was started on triple drug therapy.  The patient remained hemodynamically stable and was felt stable for discharge.  No further syncopal episodes, and again syncope was likely secondary to his anemia and hypovolemia.  DISCHARGE LABORATORIES:  Hemoglobin was 9.7, TSH was  1.05, Helicobacter was positive.  MEDICATIONS AT DISCHARGE: 1. Protonix 40 mg b.i.d. x 2 weeks, then q.d. 2. Metronidazole 500 mg b.i.d. x 2 weeks, then stop. 3. Biaxin 500 mg b.i.d. x 2 weeks, then stop. 4. Otherwise resume home medications.  SPECIAL DISCHARGE INSTRUCTIONS:  He is instructed to avoid aspirin and use Tylenol as needed.  DISCHARGE FOLLOWUP:  The patient is to follow up with Dr. Lovell Sheehan in two to three weeks. DD:  09/20/00 TD:  09/20/00 Job: [id number]

## 2011-01-12 ENCOUNTER — Encounter: Payer: Self-pay | Admitting: Internal Medicine

## 2011-01-23 ENCOUNTER — Encounter: Payer: Self-pay | Admitting: Internal Medicine

## 2011-01-23 ENCOUNTER — Ambulatory Visit (INDEPENDENT_AMBULATORY_CARE_PROVIDER_SITE_OTHER): Payer: Medicare Other | Admitting: Internal Medicine

## 2011-01-23 DIAGNOSIS — Z125 Encounter for screening for malignant neoplasm of prostate: Secondary | ICD-10-CM

## 2011-01-23 DIAGNOSIS — T887XXA Unspecified adverse effect of drug or medicament, initial encounter: Secondary | ICD-10-CM

## 2011-01-23 DIAGNOSIS — Z0000 Encounter for general adult medical examination without abnormal findings: Secondary | ICD-10-CM

## 2011-01-23 DIAGNOSIS — I10 Essential (primary) hypertension: Secondary | ICD-10-CM

## 2011-01-23 DIAGNOSIS — E785 Hyperlipidemia, unspecified: Secondary | ICD-10-CM

## 2011-01-23 DIAGNOSIS — N400 Enlarged prostate without lower urinary tract symptoms: Secondary | ICD-10-CM

## 2011-01-23 DIAGNOSIS — L719 Rosacea, unspecified: Secondary | ICD-10-CM

## 2011-01-23 DIAGNOSIS — I4892 Unspecified atrial flutter: Secondary | ICD-10-CM

## 2011-01-23 LAB — HEPATIC FUNCTION PANEL
ALT: 22 U/L (ref 0–53)
AST: 23 U/L (ref 0–37)
Alkaline Phosphatase: 69 U/L (ref 39–117)
Bilirubin, Direct: 0.2 mg/dL (ref 0.0–0.3)
Total Bilirubin: 0.8 mg/dL (ref 0.3–1.2)

## 2011-01-23 LAB — POCT URINALYSIS DIPSTICK
Leukocytes, UA: NEGATIVE
Protein, UA: NEGATIVE
Urobilinogen, UA: 5

## 2011-01-23 LAB — LIPID PANEL
LDL Cholesterol: 70 mg/dL (ref 0–99)
Total CHOL/HDL Ratio: 2

## 2011-01-23 MED ORDER — WARFARIN SODIUM 5 MG PO TABS: 5.0000 mg | ORAL_TABLET | ORAL | Status: DC

## 2011-01-23 MED ORDER — CLINDAMYCIN HCL 150 MG PO CAPS: 150.0000 mg | ORAL_CAPSULE | Freq: Every day | ORAL | Status: DC

## 2011-01-23 MED ORDER — DOXAZOSIN MESYLATE 1 MG PO TABS: 1.0000 mg | ORAL_TABLET | Freq: Every day | ORAL | Status: DC

## 2011-01-23 MED ORDER — BENAZEPRIL-HYDROCHLOROTHIAZIDE 5-6.25 MG PO TABS: 1.0000 | ORAL_TABLET | Freq: Every day | ORAL | Status: DC

## 2011-01-23 MED ORDER — PANTOPRAZOLE SODIUM 40 MG PO TBEC: 40.0000 mg | DELAYED_RELEASE_TABLET | Freq: Every day | ORAL | Status: DC

## 2011-01-23 MED ORDER — ALPRAZOLAM 0.25 MG PO TABS: 0.2500 mg | ORAL_TABLET | Freq: Every evening | ORAL | Status: DC | PRN

## 2011-01-23 MED ORDER — METOPROLOL SUCCINATE ER 25 MG PO TB24: 25.0000 mg | ORAL_TABLET | ORAL | Status: DC

## 2011-01-23 MED ORDER — DOXYCYCLINE HYCLATE 100 MG PO TABS: 100.0000 mg | ORAL_TABLET | Freq: Two times a day (BID) | ORAL | Status: AC

## 2011-01-23 MED ORDER — FINASTERIDE 5 MG PO TABS: 5.0000 mg | ORAL_TABLET | Freq: Every day | ORAL | Status: DC

## 2011-01-23 MED ORDER — DOXYCYCLINE HYCLATE 50 MG PO CAPS: 50.0000 mg | ORAL_CAPSULE | Freq: Two times a day (BID) | ORAL | Status: DC

## 2011-01-23 NOTE — Progress Notes (Signed)
Subjective:    Patient ID: Brian [name], male    DOB: 1933-02-08, 77 y.o.   MRN: [id number]  HPI In addition to the medicare exam we reviewed his CAD and valve follow up with review of the recent echo He does not think he has been in flutter He has been faithful with medications His rosacea has flaired Blood pressure stable and not chest pain  Review of Systems  Constitutional: Negative for fever and fatigue.  HENT: Negative for hearing loss, congestion, neck pain and postnasal drip.   Eyes: Negative for discharge, redness and visual disturbance.  Respiratory: Negative for cough, shortness of breath and wheezing.   Cardiovascular: Negative for leg swelling.  Gastrointestinal: Negative for abdominal pain, constipation and abdominal distention.  Genitourinary: Negative for urgency and frequency.  Musculoskeletal: Negative for joint swelling and arthralgias.  Skin: Negative for color change and rash.  Neurological: Negative for weakness and light-headedness.  Hematological: Negative for adenopathy.  Psychiatric/Behavioral: Negative for behavioral problems.       Objective:   Physical Exam  Constitutional: He is oriented to person, place, and time. He appears well-nourished. No distress.  HENT:  Head: Atraumatic.  Right Ear: External ear normal.  Left Ear: External ear normal.  Eyes: EOM are normal. Pupils are equal, round, and reactive to light.  Neck: Neck supple.  Cardiovascular: Regular rhythm.   Murmur heard. Pulmonary/Chest: Breath sounds normal.  Abdominal: Bowel sounds are normal.  Genitourinary:       Prostate enlarged  Neurological: He is oriented to person, place, and time. He has normal reflexes.  Skin: Skin is warm and dry.  Psychiatric: He has a normal mood and affect. His behavior is normal. Judgment and thought content normal.          Assessment & Plan:   Subjective:    KINNICK [name] is a 77 y.o. male who presents for Medicare Annual/Subsequent  preventive examination.   Preventive Screening-Counseling & Management  Tobacco History  Smoking status  . Never Smoker   Smokeless tobacco  . Not on file    Problems Prior to Visit 1.   Current Problems (verified) Patient Active Problem List  Diagnoses  . OTH MALIG NEOPLASM SKIN OTH\T\UNSPEC PARTS FACE  . OTHER AND UNSPECIFIED MITRAL VALVE DISEASES  . HYPERTENSION  . CORONARY ARTERY DISEASE  . ATRIAL FLUTTER  . PEPTIC ULCER DISEASE  . BENIGN PROSTATIC HYPERTROPHY  . ROSACEA  . ACTINIC KERATOSIS, HEAD  . DEGENERATIVE JOINT DISEASE  . ROTATOR CUFF SYNDROME, LEFT  . EPISTAXIS, RECURRENT  . UNS ADVRS EFF UNS RX MEDICINAL&BIOLOGICAL SBSTNC  . SYNCOPE, HX OF  . TRANSIENT ISCHEMIC ATTACK, HX OF  . DVT, HX OF  . BENIGN PROSTATIC HYPERTROPHY, HX OF  . MITRAL VALVE REPLACEMENT, HX OF  . HIP REPLACEMENT, TOTAL, HX OF  . APPENDECTOMY, HX OF  . HERNIORRHAPHY, HX OF  . OTH MALIG NEOPLASM SKIN OTH&UNSPEC PARTS FACE    Medications Prior to Visit Current Outpatient Prescriptions on File Prior to Visit  Medication Sig Dispense Refill  . ALPRAZolam (XANAX) 0.25 MG tablet TAKE 1 TABLET BY MOUTH EVERY NIGHT AT BEDTIME  90 tablet  0  . benazepril-hydrochlorthiazide (LOTENSIN HCT) 5-6.25 MG per tablet Take 1 tablet by mouth daily.        . clindamycin (CLEOCIN) 150 MG capsule TAKE 4 CAPSULES 1 HOUR PRIOR TO DENTAL WORK  4 capsule  2  . doxazosin (CARDURA) 1 MG tablet Take 1 mg by mouth daily.        Marland Kitchen  doxycycline (VIBRAMYCIN) 50 MG capsule Take 50 mg by mouth 2 (two) times daily.        . finasteride (PROSCAR) 5 MG tablet Take 5 mg by mouth daily.        Marland Kitchen glucosamine-chondroitin 500-400 MG tablet Take 1 tablet by mouth daily.        . metoprolol succinate (TOPROL-XL) 25 MG 24 hr tablet Take by mouth. 1 in am and 2 in pm       . pantoprazole (PROTONIX) 40 MG tablet Take 40 mg by mouth daily.        Marland Kitchen warfarin (COUMADIN) 5 MG tablet take as directed BY COUMADIN CLINIC  90 tablet  3     Current Medications (verified) Current Outpatient Prescriptions  Medication Sig Dispense Refill  . ALPRAZolam (XANAX) 0.25 MG tablet TAKE 1 TABLET BY MOUTH EVERY NIGHT AT BEDTIME  90 tablet  0  . benazepril-hydrochlorthiazide (LOTENSIN HCT) 5-6.25 MG per tablet Take 1 tablet by mouth daily.        . clindamycin (CLEOCIN) 150 MG capsule TAKE 4 CAPSULES 1 HOUR PRIOR TO DENTAL WORK  4 capsule  2  . doxazosin (CARDURA) 1 MG tablet Take 1 mg by mouth daily.        Marland Kitchen doxycycline (VIBRAMYCIN) 50 MG capsule Take 50 mg by mouth 2 (two) times daily.        . finasteride (PROSCAR) 5 MG tablet Take 5 mg by mouth daily.        Marland Kitchen glucosamine-chondroitin 500-400 MG tablet Take 1 tablet by mouth daily.        . metoprolol succinate (TOPROL-XL) 25 MG 24 hr tablet Take by mouth. 1 in am and 2 in pm       . pantoprazole (PROTONIX) 40 MG tablet Take 40 mg by mouth daily.        Marland Kitchen warfarin (COUMADIN) 5 MG tablet take as directed BY COUMADIN CLINIC  90 tablet  3     Allergies (verified) Penicillins   PAST HISTORY  Family History Family History  Problem Relation Age of Onset  . Cancer    . Mitral valve prolapse Mother   . Coronary artery disease Father     Social History History  Substance Use Topics  . Smoking status: Never Smoker   . Smokeless tobacco: Not on file  . Alcohol Use: No    Are there smokers in your home (other than you)?  No  Risk Factors Current exercise habits: Gym/ health club routine includes cardio.  Dietary issues discussed: salt limitation  Cardiac risk factors: advanced age (older than 60 for men, 37 for women), dyslipidemia, hypertension and male gender.  Depression Screen (Note: if answer to either of the following is "Yes", a more complete depression screening is indicated)   Q1: Over the past two weeks, have you felt down, depressed or hopeless? No  Q2: Over the past two weeks, have you felt little interest or pleasure in doing things? No  Have you lost  interest or pleasure in daily life? No  Do you often feel hopeless? No  Do you cry easily over simple problems? No  Activities of Daily Living In your present state of health, do you have any difficulty performing the following activities?:  Driving? No Managing money?  No Feeding yourself? No Getting from bed to chair? No Climbing a flight of stairs? No Preparing food and eating?: No Bathing or showering? No Getting dressed: No Getting to the toilet? No Using  the toilet:No Moving around from place to place: No In the past year have you fallen or had a near fall?:No   Are you sexually active?  Yes  Do you have more than one partner?  No  Hearing Difficulties: No Do you often ask people to speak up or repeat themselves? Yes Do you experience ringing or noises in your ears? Yes Do you have difficulty understanding soft or whispered voices? No   Do you feel that you have a problem with memory? Yes  Do you often misplace items? No  Do you feel safe at home?  Yes  Cognitive Testing  Alert? Yes  Normal Appearance?Yes  Oriented to person? Yes  Place? Yes   Time? Yes  Recall of three objects?  Yes  Can perform simple calculations? Yes  Displays appropriate judgment?Yes  Can read the correct time from a watch face?Yes   Advanced Directives have been discussed with the patient? Yes   List the Names of Other Physician/Practitioners you currently use: 1.  Cardiology, Gastroenterology per chart  Indicate any recent Medical Services you may have received from other than [facility] providers in the past year (date may be approximate).  Immunization History  Administered Date(s) Administered  . H1N1 09/01/2008  . Influenza Whole 04/26/2009  . Pneumococcal Polysaccharide 09/04/2007  . Td 09/04/2007, 03/12/2008    Screening Tests Health Maintenance  Topic Date Due  . Colonoscopy  01/06/1985  . Zostavax  01/07/1995  . Influenza Vaccine  05/22/2011  . Tetanus/tdap  03/12/2018  .  Pneumococcal Polysaccharide Vaccine Age 7 And Over  Completed    All answers were reviewed with the patient and necessary referrals were made:  Carrie Mew   01/23/2011   History reviewed: allergies, current medications, past family history, past medical history, past social history, past surgical history and problem list  Review of Systems A comprehensive review of systems was negative.    Objective:     Vision by Snellen chart: right eye:20/20, left eye:20/20 There were no vitals taken for this visit. There is no height or weight on file to calculate BMI.  General appearance: alert, appears stated age and no distress Head: Normocephalic, without obvious abnormality, atraumatic, receding hair line Eyes: conjunctivae/corneas clear. PERRL, EOM's intact. Fundi benign. Ears: normal TM's and external ear canals both ears Nose: Nares normal. Septum midline. Mucosa normal. No drainage or sinus tenderness. Throat: lips, mucosa, and tongue normal; teeth and gums normal Neck: no adenopathy, no carotid bruit, no JVD, supple, symmetrical, trachea midline and thyroid not enlarged, symmetric, no tenderness/mass/nodules Lungs: clear to auscultation bilaterally and normal percussion bilaterally Chest wall: no tenderness Heart: regular rate and rhythm and systolic murmur: systolic ejection 2/6, crescendo at 2nd left intercostal space Abdomen: soft, non-tender; bowel sounds normal; no masses,  no organomegaly Rectal: the prostate is enlarged at the bilateral, with an approx volume of 50 gms, 2+ bulge Extremities: varicose veins noted and venous stasis dermatitis noted     Assessment:     Patient presents for yearly preventative medicine examination.   all immunizations and health maintenance protocols were reviewed with the patient and they are up to date with these protocols.   screening laboratory values were reviewed with the patient including screening of hyperlipidemia PSA renal  function and hepatic function.   There medications past medical history social history problem list and allergies were reviewed in detail.   Goals were established with regard to weight loss exercise diet in compliance with medications  Plan:  Blood pressure and cholesterol were excellent at the last measurement he is due management of both today a basic metabolic panel as well as a lipid panel will be drawn a liver panel will be drawn to monitor drug side effect.  His rosacea has flared and I suspect he will need a course of doxycycline   During the course of the visit the patient was educated and counseled about appropriate screening and preventive services including:    Influenza vaccine  Colorectal cancer screening  Advanced directives: has an advanced directive - a copy HAS NOT been provided.  Diet review for nutrition referral? Yes ____  Not Indicated _x___   Patient Instructions (the written plan) was given to the patient.  Medicare Attestation I have personally reviewed: The patient's medical and social history Their use of alcohol, tobacco or illicit drugs Their current medications and supplements The patient's functional ability including ADLs,fall risks, home safety risks, cognitive, and hearing and visual impairment Diet and physical activities Evidence for depression or mood disorders  The patient's weight, height, BMI, and visual acuity have been recorded in the chart.  I have made referrals, counseling, and provided education to the patient based on review of the above and I have provided the patient with a written personalized care plan for preventive services.     Carrie Mew   01/23/2011

## 2011-01-24 LAB — BASIC METABOLIC PANEL WITH GFR
CO2: 30 mEq/L (ref 19–32)
Chloride: 101 mEq/L (ref 96–112)
Sodium: 139 mEq/L (ref 135–145)

## 2011-01-25 ENCOUNTER — Telehealth: Payer: Self-pay | Admitting: *Deleted

## 2011-01-25 NOTE — Telephone Encounter (Signed)
Gave results and copy out front for pick up

## 2011-01-25 NOTE — Telephone Encounter (Signed)
Pt would like lab results.  

## 2011-01-27 ENCOUNTER — Encounter: Payer: Medicare Other | Admitting: *Deleted

## 2011-01-30 ENCOUNTER — Telehealth: Payer: Self-pay | Admitting: Cardiology

## 2011-01-30 NOTE — Telephone Encounter (Signed)
Pt Faxed ROI signed, Mailed Labs for x1 year to Pt Address  01/30/11/km

## 2011-03-06 ENCOUNTER — Other Ambulatory Visit: Payer: Self-pay | Admitting: *Deleted

## 2011-03-06 ENCOUNTER — Other Ambulatory Visit: Payer: Self-pay | Admitting: Internal Medicine

## 2011-03-06 MED ORDER — DOXYCYCLINE HYCLATE 50 MG PO CAPS: 50.0000 mg | ORAL_CAPSULE | Freq: Two times a day (BID) | ORAL | Status: DC

## 2011-03-06 NOTE — Telephone Encounter (Signed)
done

## 2011-03-06 NOTE — Telephone Encounter (Signed)
Patient requested refill on Doxycycline 100mg   [facility] [place]  Phone number: [phone]

## 2011-04-05 ENCOUNTER — Other Ambulatory Visit: Payer: Self-pay | Admitting: Internal Medicine

## 2011-05-15 ENCOUNTER — Encounter: Payer: Self-pay | Admitting: Internal Medicine

## 2011-05-15 ENCOUNTER — Ambulatory Visit (INDEPENDENT_AMBULATORY_CARE_PROVIDER_SITE_OTHER): Payer: Medicare Other | Admitting: Internal Medicine

## 2011-05-15 VITALS — BP 140/72 | HR 64 | Temp 98.4°F | Resp 16 | Ht 67.0 in | Wt 156.0 lb

## 2011-05-15 DIAGNOSIS — I10 Essential (primary) hypertension: Secondary | ICD-10-CM

## 2011-05-15 DIAGNOSIS — R634 Abnormal weight loss: Secondary | ICD-10-CM

## 2011-05-15 DIAGNOSIS — T887XXA Unspecified adverse effect of drug or medicament, initial encounter: Secondary | ICD-10-CM

## 2011-05-15 DIAGNOSIS — K279 Peptic ulcer, site unspecified, unspecified as acute or chronic, without hemorrhage or perforation: Secondary | ICD-10-CM

## 2011-05-15 DIAGNOSIS — R358 Other polyuria: Secondary | ICD-10-CM

## 2011-05-15 DIAGNOSIS — G4700 Insomnia, unspecified: Secondary | ICD-10-CM

## 2011-05-15 DIAGNOSIS — Z23 Encounter for immunization: Secondary | ICD-10-CM

## 2011-05-15 DIAGNOSIS — F411 Generalized anxiety disorder: Secondary | ICD-10-CM

## 2011-05-15 DIAGNOSIS — R3589 Other polyuria: Secondary | ICD-10-CM

## 2011-05-15 DIAGNOSIS — N400 Enlarged prostate without lower urinary tract symptoms: Secondary | ICD-10-CM

## 2011-05-15 DIAGNOSIS — R5381 Other malaise: Secondary | ICD-10-CM

## 2011-05-15 LAB — BASIC METABOLIC PANEL
Calcium: 8.7 mg/dL (ref 8.4–10.5)
GFR: 120.39 mL/min (ref >60.00)
Sodium: 134 mEq/L — ABNORMAL LOW (ref 135–145)

## 2011-05-15 LAB — HEMOGLOBIN A1C: Hgb A1c MFr Bld: 5.3 % (ref 4.6–6.5)

## 2011-05-15 LAB — CBC WITH DIFFERENTIAL/PLATELET
Basophils Relative: 0.7 % (ref 0.0–3.0)
Hemoglobin: 13.7 g/dL (ref 13.0–17.0)
Lymphocytes Relative: 17.7 % (ref 12.0–46.0)
Monocytes Relative: 10.7 % (ref 3.0–12.0)
Neutro Abs: 3.7 10*3/uL (ref 1.4–7.7)
Neutrophils Relative %: 70.8 % (ref 43.0–77.0)
RBC: 4.41 Mil/uL (ref 4.22–5.81)
WBC: 5.2 10*3/uL (ref 4.5–10.5)

## 2011-05-15 LAB — T4, FREE: Free T4: 1.19 ng/dL (ref 0.60–1.60)

## 2011-05-15 MED ORDER — ALPRAZOLAM 0.25 MG PO TABS: 0.2500 mg | ORAL_TABLET | Freq: Every evening | ORAL | Status: DC | PRN

## 2011-05-15 MED ORDER — MIRTAZAPINE 15 MG PO TBDP: 15.0000 mg | ORAL_TABLET | Freq: Every day | ORAL | Status: AC

## 2011-05-15 MED ORDER — DOXYCYCLINE HYCLATE 100 MG PO CAPS: 100.0000 mg | ORAL_CAPSULE | Freq: Two times a day (BID) | ORAL | Status: DC

## 2011-05-15 MED ORDER — BENAZEPRIL-HYDROCHLOROTHIAZIDE 5-6.25 MG PO TABS: 1.0000 | ORAL_TABLET | Freq: Every day | ORAL | Status: DC

## 2011-05-15 NOTE — Progress Notes (Signed)
  Subjective:    Patient ID: Brian [name], male    DOB: 1932-11-16, 77 y.o.   MRN: [id number]  HPI  Not feeling well, thirsty and dry lips Drinks water "all the time" and has been urinating "a lot" Grandmother and son are diabetics Weight loss noted Increased fatigue. No legs swelling No significant SOB no chest pain Increased stress and insomnia  Review of Systems  Constitutional: Negative for fever and fatigue.  HENT: Negative for hearing loss, congestion, neck pain and postnasal drip.   Eyes: Negative for discharge, redness and visual disturbance.  Respiratory: Negative for cough, shortness of breath and wheezing.   Cardiovascular: Negative for leg swelling.  Gastrointestinal: Negative for abdominal pain, constipation and abdominal distention.  Genitourinary: Negative for urgency and frequency.  Musculoskeletal: Negative for joint swelling and arthralgias.  Skin: Negative for color change and rash.  Neurological: Negative for weakness and light-headedness.  Hematological: Negative for adenopathy.  Psychiatric/Behavioral: Negative for behavioral problems.   Past Medical History  Diagnosis Date  . Osteoarthrosis, unspecified whether generalized or localized, unspecified site   . BPH (benign prostatic hypertrophy)   . Anemia   . Hemorrhage of gastrointestinal tract, unspecified   . Epistaxis   . Atrial flutter   . Hypertension   . TIA (transient ischemic attack)   . Personal history of venous thrombosis and embolism   . Rosacea    Past Surgical History  Procedure Date  . Appendectomy   . Hernia repair   . Total hip arthroplasty   . Joint replacement   . Mvp repair     reports that he has never smoked. He does not have any smokeless tobacco history on file. He reports that he does not drink alcohol or use illicit drugs. family history includes Coronary artery disease in his father and Mitral valve prolapse in his mother. Allergies  Allergen Reactions  .  Penicillins     REACTION: itching        Objective:   Physical Exam  Nursing note and vitals reviewed. Constitutional: He appears well-developed and well-nourished.  HENT:  Head: Normocephalic and atraumatic.  Eyes: Conjunctivae are normal. Pupils are equal, round, and reactive to light.  Neck: Normal range of motion. Neck supple.  Cardiovascular: Normal rate and regular rhythm.   Pulmonary/Chest: Effort normal and breath sounds normal.  Abdominal: Soft. Bowel sounds are normal.  Musculoskeletal: He exhibits no edema.  Skin: Skin is warm and dry.  Psychiatric:       Worried about business          Assessment & Plan:  Patient has unusual amount of fatigue associated with increased thirst dry mouth and dry lips.  We will screen him for diabetes thyroid disease and given his history of peptic ulcer disease a CBC with differential to make sure he is not anemic in the interim I believe that sleep and the lack thereof plays a role in what's going on with him as well as his anxiety over the economy so we may start a mild to moderate medication that would help with both sleep and mood.

## 2011-05-16 ENCOUNTER — Telehealth: Payer: Self-pay | Admitting: *Deleted

## 2011-05-16 NOTE — Telephone Encounter (Signed)
Pt would like lab results, please. 

## 2011-05-17 NOTE — Telephone Encounter (Signed)
Pt informed wnl and copy up front for pt

## 2011-05-19 ENCOUNTER — Other Ambulatory Visit: Payer: Self-pay | Admitting: Internal Medicine

## 2011-05-23 ENCOUNTER — Other Ambulatory Visit: Payer: Self-pay | Admitting: *Deleted

## 2011-05-23 MED ORDER — METOPROLOL SUCCINATE ER 25 MG PO TB24: ORAL_TABLET | ORAL | Status: DC

## 2011-07-07 ENCOUNTER — Other Ambulatory Visit: Payer: Self-pay | Admitting: Internal Medicine

## 2011-07-24 ENCOUNTER — Ambulatory Visit: Payer: Medicare Other | Admitting: Internal Medicine

## 2011-07-26 ENCOUNTER — Other Ambulatory Visit: Payer: Self-pay | Admitting: Internal Medicine

## 2011-07-26 MED ORDER — CLINDAMYCIN HCL 150 MG PO CAPS: 150.0000 mg | ORAL_CAPSULE | ORAL | Status: DC

## 2011-07-26 MED ORDER — FINASTERIDE 5 MG PO TABS: 5.0000 mg | ORAL_TABLET | Freq: Every day | ORAL | Status: DC

## 2011-07-28 ENCOUNTER — Ambulatory Visit: Payer: Medicare Other | Admitting: Internal Medicine

## 2011-08-23 ENCOUNTER — Other Ambulatory Visit: Payer: Self-pay | Admitting: Internal Medicine

## 2011-08-23 DIAGNOSIS — I4891 Unspecified atrial fibrillation: Secondary | ICD-10-CM | POA: Diagnosis not present

## 2011-09-05 ENCOUNTER — Other Ambulatory Visit: Payer: Self-pay | Admitting: Internal Medicine

## 2011-10-11 ENCOUNTER — Encounter: Payer: Self-pay | Admitting: Internal Medicine

## 2011-10-11 ENCOUNTER — Ambulatory Visit (INDEPENDENT_AMBULATORY_CARE_PROVIDER_SITE_OTHER): Payer: Medicare Other | Admitting: Internal Medicine

## 2011-10-11 VITALS — BP 110/70 | HR 72 | Temp 98.0°F | Resp 16 | Ht 67.0 in | Wt 157.0 lb

## 2011-10-11 DIAGNOSIS — I10 Essential (primary) hypertension: Secondary | ICD-10-CM

## 2011-10-11 DIAGNOSIS — I2510 Atherosclerotic heart disease of native coronary artery without angina pectoris: Secondary | ICD-10-CM | POA: Diagnosis not present

## 2011-10-11 DIAGNOSIS — I4892 Unspecified atrial flutter: Secondary | ICD-10-CM | POA: Diagnosis not present

## 2011-10-11 DIAGNOSIS — E785 Hyperlipidemia, unspecified: Secondary | ICD-10-CM | POA: Diagnosis not present

## 2011-10-11 DIAGNOSIS — T887XXA Unspecified adverse effect of drug or medicament, initial encounter: Secondary | ICD-10-CM

## 2011-10-11 LAB — BASIC METABOLIC PANEL
CO2: 27 mEq/L (ref 19–32)
Calcium: 9 mg/dL (ref 8.4–10.5)
Chloride: 102 mEq/L (ref 96–112)
Creatinine, Ser: 0.7 mg/dL (ref 0.4–1.5)
Glucose, Bld: 97 mg/dL (ref 70–99)

## 2011-10-11 LAB — CBC WITH DIFFERENTIAL/PLATELET
Basophils Absolute: 0 10*3/uL (ref 0.0–0.1)
Basophils Relative: 0.7 % (ref 0.0–3.0)
Eosinophils Absolute: 0 10*3/uL (ref 0.0–0.7)
MCHC: 33.9 g/dL (ref 30.0–36.0)
MCV: 87.3 fl (ref 78.0–100.0)
Monocytes Absolute: 0.5 10*3/uL (ref 0.1–1.0)
Neutro Abs: 4 10*3/uL (ref 1.4–7.7)
Neutrophils Relative %: 73.3 % (ref 43.0–77.0)
RBC: 4.55 Mil/uL (ref 4.22–5.81)
RDW: 16.2 % — ABNORMAL HIGH (ref 11.5–14.6)

## 2011-10-11 LAB — HEPATIC FUNCTION PANEL
AST: 24 U/L (ref 0–37)
Albumin: 3.7 g/dL (ref 3.5–5.2)
Alkaline Phosphatase: 84 U/L (ref 39–117)
Bilirubin, Direct: 0.1 mg/dL (ref 0.0–0.3)
Total Bilirubin: 1 mg/dL (ref 0.3–1.2)

## 2011-10-11 LAB — LIPID PANEL
LDL Cholesterol: 84 mg/dL (ref 0–99)
Total CHOL/HDL Ratio: 3
Triglycerides: 84 mg/dL (ref 0.0–149.0)

## 2011-10-11 LAB — POCT INR: INR: 1.6

## 2011-10-11 MED ORDER — BENAZEPRIL-HYDROCHLOROTHIAZIDE 5-6.25 MG PO TABS: 1.0000 | ORAL_TABLET | Freq: Every day | ORAL | Status: DC

## 2011-10-11 MED ORDER — DOXAZOSIN MESYLATE 1 MG PO TABS: 1.0000 mg | ORAL_TABLET | Freq: Every day | ORAL | Status: DC

## 2011-10-11 MED ORDER — DOXYCYCLINE HYCLATE 100 MG PO CAPS: 100.0000 mg | ORAL_CAPSULE | Freq: Two times a day (BID) | ORAL | Status: DC

## 2011-10-11 MED ORDER — PANTOPRAZOLE SODIUM 40 MG PO TBEC: 40.0000 mg | DELAYED_RELEASE_TABLET | Freq: Every day | ORAL | Status: DC

## 2011-10-11 MED ORDER — FINASTERIDE 5 MG PO TABS: 5.0000 mg | ORAL_TABLET | Freq: Every day | ORAL | Status: DC

## 2011-10-11 MED ORDER — WARFARIN SODIUM 5 MG PO TABS: 5.0000 mg | ORAL_TABLET | ORAL | Status: DC

## 2011-10-11 MED ORDER — CLINDAMYCIN HCL 150 MG PO CAPS: ORAL_CAPSULE | ORAL | Status: DC

## 2011-10-11 MED ORDER — ALPRAZOLAM 0.25 MG PO TABS: 0.2500 mg | ORAL_TABLET | Freq: Every evening | ORAL | Status: DC | PRN

## 2011-10-11 MED ORDER — METOPROLOL SUCCINATE ER 25 MG PO TB24: ORAL_TABLET | ORAL | Status: DC

## 2011-10-11 NOTE — Patient Instructions (Addendum)
The patient is instructed to continue all medications as prescribed. Schedule followup with check out clerk upon leaving the clinic    Latest dosing instructions   Total Sun Mon Tue Wed Thu Fri Sat   32.5 5 mg 5 mg 5 mg 5 mg 5 mg 5 mg 2.5 mg    (5 mg1) (5 mg1) (5 mg1) (5 mg1) (5 mg1) (5 mg1) (5 mg0.5)

## 2011-10-11 NOTE — Progress Notes (Signed)
Subjective:    Patient ID: Brian [name], male    DOB: [birth year], 78 y.o.   MRN: [id number]  HPI Patient is a 78 year old male who is followed for hypertension a history of valvular repair and a history of atrial flutter.  He presents today to discuss his protimes.  At home his protimes have fluctuated recently and they went as high as 3.9. He held his Coumadin for 1 day 8 some green salads and now presents 2 days later for a pro time check to see if it has resumed in the range between 2 and 3 which are the goal... Meds no other symptomology of hypercoagulability including nosebleeds or notice of any dark stools.  His CBC differential and his renal function were both monitored in September of 2012 and they were normal   Review of Systems  Constitutional: Negative for fever and fatigue.  HENT: Negative for hearing loss, congestion, neck pain and postnasal drip.   Eyes: Negative for discharge, redness and visual disturbance.  Respiratory: Negative for cough, shortness of breath and wheezing.   Cardiovascular: Negative for leg swelling.  Gastrointestinal: Negative for abdominal pain, constipation and abdominal distention.  Genitourinary: Negative for urgency and frequency.  Musculoskeletal: Negative for joint swelling and arthralgias.  Skin: Negative for color change and rash.  Neurological: Negative for weakness and light-headedness.  Hematological: Negative for adenopathy.  Psychiatric/Behavioral: Negative for behavioral problems.   Past Medical History  Diagnosis Date  . Osteoarthrosis, unspecified whether generalized or localized, unspecified site   . BPH (benign prostatic hypertrophy)   . Anemia   . Hemorrhage of gastrointestinal tract, unspecified   . Epistaxis   . Atrial flutter   . Hypertension   . TIA (transient ischemic attack)   . Personal history of venous thrombosis and embolism   . Rosacea     History   Social History  . Marital Status: Married    Spouse  Name: N/A    Number of Children: N/A  . Years of Education: N/A   Occupational History  . Not on file.   Social History Main Topics  . Smoking status: Never Smoker   . Smokeless tobacco: Not on file  . Alcohol Use: No  . Drug Use: No  . Sexually Active: Not on file   Other Topics Concern  . Not on file   Social History Narrative  . No narrative on file    Past Surgical History  Procedure Date  . Appendectomy   . Hernia repair   . Total hip arthroplasty   . Joint replacement   . Mvp repair     Family History  Problem Relation Age of Onset  . Mitral valve prolapse Mother   . Coronary artery disease Father     Allergies  Allergen Reactions  . Penicillins     REACTION: itching    Current Outpatient Prescriptions on File Prior to Visit  Medication Sig Dispense Refill  . glucosamine-chondroitin 500-400 MG tablet Take 1 tablet by mouth daily.          BP 110/70  Pulse 72  Temp 98 F (36.7 C)  Resp 16  Ht 5\' 7"  (1.702 m)  Wt 157 lb (71.215 kg)  BMI 24.59 kg/m2       Objective:   Physical Exam  Nursing note and vitals reviewed. Constitutional: He appears well-developed and well-nourished.  HENT:  Head: Normocephalic and atraumatic.  Eyes: Conjunctivae are normal. Pupils are equal, round, and reactive to light.  Neck:  Normal range of motion. Neck supple.  Cardiovascular: Normal rate and regular rhythm.   Pulmonary/Chest: Effort normal and breath sounds normal.  Abdominal: Soft. Bowel sounds are normal.          Assessment & Plan:  Patient is stable at this time from the standpoint of his atrial fibrillation is hypertension.  We'll monitor a pro time today along with a renal panel a liver panel and a basic metabolic panel.  He is doing very well stable and all his problems atrial flutter is stable in sinus rhythm by examination today and his valve is stable.  Reviewed medications, exercise.

## 2011-10-12 ENCOUNTER — Telehealth: Payer: Self-pay | Admitting: Internal Medicine

## 2011-10-12 NOTE — Telephone Encounter (Signed)
Pt would copy of blood work results. Wife will like to pick up results after 2pm tomorrow

## 2011-10-13 NOTE — Telephone Encounter (Signed)
Wife informed ready for pick up 

## 2011-10-16 DIAGNOSIS — I4891 Unspecified atrial fibrillation: Secondary | ICD-10-CM | POA: Diagnosis not present

## 2011-12-14 DIAGNOSIS — I4891 Unspecified atrial fibrillation: Secondary | ICD-10-CM | POA: Diagnosis not present

## 2012-01-11 ENCOUNTER — Ambulatory Visit: Payer: Medicare Other | Admitting: Internal Medicine

## 2012-01-29 DIAGNOSIS — I4891 Unspecified atrial fibrillation: Secondary | ICD-10-CM | POA: Diagnosis not present

## 2012-02-07 DIAGNOSIS — Z96649 Presence of unspecified artificial hip joint: Secondary | ICD-10-CM | POA: Diagnosis not present

## 2012-02-07 DIAGNOSIS — Z471 Aftercare following joint replacement surgery: Secondary | ICD-10-CM | POA: Diagnosis not present

## 2012-02-07 DIAGNOSIS — M25559 Pain in unspecified hip: Secondary | ICD-10-CM | POA: Insufficient documentation

## 2012-02-07 DIAGNOSIS — M461 Sacroiliitis, not elsewhere classified: Secondary | ICD-10-CM | POA: Diagnosis not present

## 2012-02-07 DIAGNOSIS — M169 Osteoarthritis of hip, unspecified: Secondary | ICD-10-CM | POA: Insufficient documentation

## 2012-02-12 ENCOUNTER — Encounter: Payer: Self-pay | Admitting: Internal Medicine

## 2012-02-12 ENCOUNTER — Ambulatory Visit (INDEPENDENT_AMBULATORY_CARE_PROVIDER_SITE_OTHER): Payer: Medicare Other | Admitting: Internal Medicine

## 2012-02-12 ENCOUNTER — Ambulatory Visit (INDEPENDENT_AMBULATORY_CARE_PROVIDER_SITE_OTHER): Payer: Medicare Other | Admitting: Family

## 2012-02-12 VITALS — BP 120/70 | HR 64 | Temp 98.2°F | Resp 16 | Ht 67.0 in | Wt 158.0 lb

## 2012-02-12 DIAGNOSIS — I4891 Unspecified atrial fibrillation: Secondary | ICD-10-CM | POA: Diagnosis not present

## 2012-02-12 DIAGNOSIS — I4892 Unspecified atrial flutter: Secondary | ICD-10-CM

## 2012-02-12 DIAGNOSIS — I10 Essential (primary) hypertension: Secondary | ICD-10-CM | POA: Diagnosis not present

## 2012-02-12 DIAGNOSIS — IMO0002 Reserved for concepts with insufficient information to code with codable children: Secondary | ICD-10-CM

## 2012-02-12 LAB — BASIC METABOLIC PANEL
BUN: 12 mg/dL (ref 6–23)
Calcium: 8.7 mg/dL (ref 8.4–10.5)
GFR: 98.19 mL/min (ref >60.00)
Glucose, Bld: 93 mg/dL (ref 70–99)

## 2012-02-12 MED ORDER — METOPROLOL SUCCINATE ER 25 MG PO TB24: ORAL_TABLET | ORAL | Status: DC

## 2012-02-12 MED ORDER — FINASTERIDE 5 MG PO TABS: 5.0000 mg | ORAL_TABLET | Freq: Every day | ORAL | Status: DC

## 2012-02-12 MED ORDER — ALPRAZOLAM 0.25 MG PO TABS: 0.2500 mg | ORAL_TABLET | Freq: Every evening | ORAL | Status: DC | PRN

## 2012-02-12 MED ORDER — WARFARIN SODIUM 5 MG PO TABS: 5.0000 mg | ORAL_TABLET | ORAL | Status: DC

## 2012-02-12 MED ORDER — DOXAZOSIN MESYLATE 1 MG PO TABS: 1.0000 mg | ORAL_TABLET | Freq: Every day | ORAL | Status: DC

## 2012-02-12 MED ORDER — PANTOPRAZOLE SODIUM 40 MG PO TBEC: 40.0000 mg | DELAYED_RELEASE_TABLET | Freq: Every day | ORAL | Status: DC

## 2012-02-12 MED ORDER — DOXYCYCLINE HYCLATE 100 MG PO CAPS: 100.0000 mg | ORAL_CAPSULE | Freq: Two times a day (BID) | ORAL | Status: DC

## 2012-02-12 MED ORDER — CLINDAMYCIN HCL 150 MG PO CAPS: ORAL_CAPSULE | ORAL | Status: DC

## 2012-02-12 MED ORDER — BENAZEPRIL-HYDROCHLOROTHIAZIDE 5-6.25 MG PO TABS: 1.0000 | ORAL_TABLET | Freq: Every day | ORAL | Status: DC

## 2012-02-12 NOTE — Patient Instructions (Addendum)
Hold coumadin today. Resume normal dose.     Latest dosing instructions   Total Sun Mon Tue Wed Thu Fri Sat   32.5 5 mg 5 mg 5 mg 5 mg 5 mg 5 mg 2.5 mg    (5 mg1) (5 mg1) (5 mg1) (5 mg1) (5 mg1) (5 mg1) (5 mg0.5)

## 2012-02-12 NOTE — Patient Instructions (Signed)
The patient is instructed to continue all medications as prescribed. Schedule followup with check out clerk upon leaving the clinic  

## 2012-02-12 NOTE — Progress Notes (Signed)
Subjective:    Patient ID: Brian [name], male    DOB: 1933-07-27, 78 y.o.   MRN: [id number]  HPI Increased anxiety over age and condition Monitoring for blood pressure medications Stable CAD Stable AF Monitoring PT to compare to home PT   Review of Systems  Constitutional: Negative for fever and fatigue.  HENT: Negative for hearing loss, congestion, neck pain and postnasal drip.   Eyes: Negative for discharge, redness and visual disturbance.  Respiratory: Negative for cough, shortness of breath and wheezing.   Cardiovascular: Negative for leg swelling.  Gastrointestinal: Negative for abdominal pain, constipation and abdominal distention.  Genitourinary: Negative for urgency and frequency.  Musculoskeletal: Negative for joint swelling and arthralgias.  Skin: Negative for color change and rash.  Neurological: Negative for weakness and light-headedness.  Hematological: Negative for adenopathy.  Psychiatric/Behavioral: Negative for behavioral problems.   Past Medical History  Diagnosis Date  . Osteoarthrosis, unspecified whether generalized or localized, unspecified site   . BPH (benign prostatic hypertrophy)   . Anemia   . Hemorrhage of gastrointestinal tract, unspecified   . Epistaxis   . Atrial flutter   . Hypertension   . TIA (transient ischemic attack)   . Personal history of venous thrombosis and embolism   . Rosacea     History   Social History  . Marital Status: Married    Spouse Name: N/A    Number of Children: N/A  . Years of Education: N/A   Occupational History  . Not on file.   Social History Main Topics  . Smoking status: Never Smoker   . Smokeless tobacco: Not on file  . Alcohol Use: No  . Drug Use: No  . Sexually Active: Not on file   Other Topics Concern  . Not on file   Social History Narrative  . No narrative on file    Past Surgical History  Procedure Date  . Appendectomy   . Hernia repair   . Total hip arthroplasty   . Joint  replacement   . Mvp repair     Family History  Problem Relation Age of Onset  . Mitral valve prolapse Mother   . Coronary artery disease Father     Allergies  Allergen Reactions  . Penicillins     REACTION: itching    Current Outpatient Prescriptions on File Prior to Visit  Medication Sig Dispense Refill  . glucosamine-chondroitin 500-400 MG tablet Take 1 tablet by mouth daily.        Marland Kitchen DISCONTD: benazepril-hydrochlorthiazide (LOTENSIN HCT) 5-6.25 MG per tablet Take 1 tablet by mouth daily.  90 tablet  3  . DISCONTD: doxazosin (CARDURA) 1 MG tablet Take 1 tablet (1 mg total) by mouth at bedtime.  90 tablet  3  . DISCONTD: metoprolol succinate (TOPROL-XL) 25 MG 24 hr tablet 1 every am and 2 tablets every evening  270 tablet  3  . DISCONTD: pantoprazole (PROTONIX) 40 MG tablet Take 1 tablet (40 mg total) by mouth daily.  90 tablet  3  . DISCONTD: warfarin (COUMADIN) 5 MG tablet Take 1 tablet (5 mg total) by mouth as directed.  100 tablet  3    BP 120/70  Pulse 64  Temp 98.2 F (36.8 C)  Resp 16  Ht 5\' 7"  (1.702 m)  Wt 158 lb (71.668 kg)  BMI 24.75 kg/m2       Objective:   Physical Exam  Nursing note and vitals reviewed. Constitutional: He is oriented to person, place, and time.  He appears well-developed and well-nourished.  HENT:  Head: Normocephalic and atraumatic.  Eyes: Conjunctivae are normal. Pupils are equal, round, and reactive to light.  Neck: Normal range of motion. Neck supple.  Cardiovascular: Normal rate and regular rhythm.   Pulmonary/Chest: Effort normal and breath sounds normal.  Abdominal: Soft. Bowel sounds are normal.  Musculoskeletal: Normal range of motion.  Neurological: He is alert and oriented to person, place, and time.  Skin: Skin is warm and dry.  Psychiatric: He has a normal mood and affect. His behavior is normal.          Assessment & Plan:  Stable AF checking PT at home and reported to cardiology. Needs POC PT today as a check of  his device. Hypertension stable No chest pain or abnormal SOB monitoring bmet and potassium on HTN medications

## 2012-02-16 DIAGNOSIS — M19019 Primary osteoarthritis, unspecified shoulder: Secondary | ICD-10-CM | POA: Diagnosis not present

## 2012-02-28 DIAGNOSIS — M533 Sacrococcygeal disorders, not elsewhere classified: Secondary | ICD-10-CM | POA: Diagnosis not present

## 2012-02-28 DIAGNOSIS — M461 Sacroiliitis, not elsewhere classified: Secondary | ICD-10-CM | POA: Diagnosis not present

## 2012-03-06 DIAGNOSIS — I059 Rheumatic mitral valve disease, unspecified: Secondary | ICD-10-CM | POA: Diagnosis not present

## 2012-03-06 DIAGNOSIS — I4891 Unspecified atrial fibrillation: Secondary | ICD-10-CM | POA: Diagnosis not present

## 2012-03-06 DIAGNOSIS — M461 Sacroiliitis, not elsewhere classified: Secondary | ICD-10-CM | POA: Diagnosis not present

## 2012-03-06 DIAGNOSIS — I10 Essential (primary) hypertension: Secondary | ICD-10-CM | POA: Diagnosis not present

## 2012-03-11 DIAGNOSIS — I4891 Unspecified atrial fibrillation: Secondary | ICD-10-CM | POA: Diagnosis not present

## 2012-03-22 ENCOUNTER — Other Ambulatory Visit: Payer: Self-pay | Admitting: Internal Medicine

## 2012-04-08 ENCOUNTER — Ambulatory Visit: Payer: Self-pay | Admitting: Cardiovascular Disease

## 2012-04-08 DIAGNOSIS — I4892 Unspecified atrial flutter: Secondary | ICD-10-CM

## 2012-05-08 DIAGNOSIS — Z23 Encounter for immunization: Secondary | ICD-10-CM | POA: Diagnosis not present

## 2012-05-30 DIAGNOSIS — I4891 Unspecified atrial fibrillation: Secondary | ICD-10-CM | POA: Diagnosis not present

## 2012-06-03 ENCOUNTER — Encounter: Payer: Self-pay | Admitting: Internal Medicine

## 2012-06-03 ENCOUNTER — Ambulatory Visit (INDEPENDENT_AMBULATORY_CARE_PROVIDER_SITE_OTHER): Payer: Medicare Other | Admitting: Family

## 2012-06-03 ENCOUNTER — Ambulatory Visit (INDEPENDENT_AMBULATORY_CARE_PROVIDER_SITE_OTHER): Payer: Medicare Other | Admitting: Internal Medicine

## 2012-06-03 VITALS — BP 150/80 | HR 72 | Temp 98.6°F | Resp 16 | Ht 67.0 in | Wt 158.0 lb

## 2012-06-03 DIAGNOSIS — N400 Enlarged prostate without lower urinary tract symptoms: Secondary | ICD-10-CM | POA: Diagnosis not present

## 2012-06-03 DIAGNOSIS — I4892 Unspecified atrial flutter: Secondary | ICD-10-CM | POA: Diagnosis not present

## 2012-06-03 DIAGNOSIS — I10 Essential (primary) hypertension: Secondary | ICD-10-CM

## 2012-06-03 DIAGNOSIS — T887XXA Unspecified adverse effect of drug or medicament, initial encounter: Secondary | ICD-10-CM | POA: Diagnosis not present

## 2012-06-03 DIAGNOSIS — L719 Rosacea, unspecified: Secondary | ICD-10-CM | POA: Diagnosis not present

## 2012-06-03 DIAGNOSIS — Z9889 Other specified postprocedural states: Secondary | ICD-10-CM | POA: Diagnosis not present

## 2012-06-03 DIAGNOSIS — M1990 Unspecified osteoarthritis, unspecified site: Secondary | ICD-10-CM

## 2012-06-03 LAB — CBC WITH DIFFERENTIAL/PLATELET
Basophils Relative: 0.4 % (ref 0.0–3.0)
Eosinophils Absolute: 0 10*3/uL (ref 0.0–0.7)
Eosinophils Relative: 0.3 % (ref 0.0–5.0)
Hemoglobin: 13.9 g/dL (ref 13.0–17.0)
Lymphocytes Relative: 19 % (ref 12.0–46.0)
MCHC: 33.4 g/dL (ref 30.0–36.0)
MCV: 89 fl (ref 78.0–100.0)
Monocytes Absolute: 0.5 10*3/uL (ref 0.1–1.0)
Neutro Abs: 3.5 10*3/uL (ref 1.4–7.7)
RBC: 4.68 Mil/uL (ref 4.22–5.81)
WBC: 4.9 10*3/uL (ref 4.5–10.5)

## 2012-06-03 LAB — HEPATIC FUNCTION PANEL
Bilirubin, Direct: 0.2 mg/dL (ref 0.0–0.3)
Total Bilirubin: 0.8 mg/dL (ref 0.3–1.2)

## 2012-06-03 LAB — BASIC METABOLIC PANEL
Calcium: 8.8 mg/dL (ref 8.4–10.5)
Creatinine, Ser: 0.7 mg/dL (ref 0.4–1.5)

## 2012-06-03 LAB — PSA: PSA: 1.92 ng/mL (ref 0.10–4.00)

## 2012-06-03 MED ORDER — PANTOPRAZOLE SODIUM 40 MG PO TBEC: 40.0000 mg | DELAYED_RELEASE_TABLET | Freq: Every day | ORAL | Status: DC

## 2012-06-03 MED ORDER — FINASTERIDE 5 MG PO TABS: 5.0000 mg | ORAL_TABLET | Freq: Every day | ORAL | Status: DC

## 2012-06-03 MED ORDER — BENAZEPRIL-HYDROCHLOROTHIAZIDE 5-6.25 MG PO TABS: 1.0000 | ORAL_TABLET | Freq: Every day | ORAL | Status: DC

## 2012-06-03 MED ORDER — WARFARIN SODIUM 5 MG PO TABS: 5.0000 mg | ORAL_TABLET | Freq: Every day | ORAL | Status: DC

## 2012-06-03 MED ORDER — METOPROLOL SUCCINATE ER 25 MG PO TB24: ORAL_TABLET | ORAL | Status: DC

## 2012-06-03 MED ORDER — ALPRAZOLAM 0.25 MG PO TABS: 0.2500 mg | ORAL_TABLET | Freq: Every evening | ORAL | Status: DC | PRN

## 2012-06-03 MED ORDER — DOXAZOSIN MESYLATE 1 MG PO TABS: 1.0000 mg | ORAL_TABLET | Freq: Every day | ORAL | Status: DC

## 2012-06-03 NOTE — Patient Instructions (Signed)
Called spoke with pt, pt states he has his own coag machine and checks his INR periodically himself, and he also has Dr Lovell Sheehan check it as well when they are at his office.     Latest dosing instructions   Total Sun Mon Tue Wed Thu Fri Sat   32.5 5 mg 5 mg 5 mg 5 mg 5 mg 5 mg 2.5 mg    (5 mg1) (5 mg1) (5 mg1) (5 mg1) (5 mg1) (5 mg1) (5 mg0.5)

## 2012-06-03 NOTE — Progress Notes (Signed)
Subjective:    Patient ID: Brian [name], male    DOB: 02/22/1933, 78 y.o.   MRN: [id number]  HPI    Review of Systems  Constitutional: Negative for fever and fatigue.  HENT: Negative for hearing loss, congestion, neck pain and postnasal drip.   Eyes: Negative for discharge, redness and visual disturbance.  Respiratory: Negative for cough, shortness of breath and wheezing.   Cardiovascular: Positive for palpitations. Negative for leg swelling.  Gastrointestinal: Negative for abdominal pain, constipation and abdominal distention.  Genitourinary: Negative for urgency and frequency.  Musculoskeletal: Positive for joint swelling and arthralgias.  Skin: Negative for color change and rash.  Neurological: Positive for weakness. Negative for light-headedness.  Hematological: Negative for adenopathy.  Psychiatric/Behavioral: Negative for behavioral problems.   Past Medical History  Diagnosis Date  . Osteoarthrosis, unspecified whether generalized or localized, unspecified site   . BPH (benign prostatic hypertrophy)   . Anemia   . Hemorrhage of gastrointestinal tract, unspecified   . Epistaxis   . Atrial flutter   . Hypertension   . TIA (transient ischemic attack)   . Personal history of venous thrombosis and embolism   . Rosacea     History   Social History  . Marital Status: Married    Spouse Name: N/A    Number of Children: N/A  . Years of Education: N/A   Occupational History  . Not on file.   Social History Main Topics  . Smoking status: Never Smoker   . Smokeless tobacco: Not on file  . Alcohol Use: No  . Drug Use: No  . Sexually Active: Not on file   Other Topics Concern  . Not on file   Social History Narrative  . No narrative on file    Past Surgical History  Procedure Date  . Appendectomy   . Hernia repair   . Total hip arthroplasty   . Joint replacement   . Mvp repair     Family History  Problem Relation Age of Onset  . Mitral valve prolapse  Mother   . Coronary artery disease Father     Allergies  Allergen Reactions  . Penicillins     REACTION: itching    Current Outpatient Prescriptions on File Prior to Visit  Medication Sig Dispense Refill  . clindamycin (CLEOCIN) 150 MG capsule Take 4 tabs prior to dental work  12 capsule  3  . doxycycline (VIBRAMYCIN) 100 MG capsule Take 1 capsule (100 mg total) by mouth 2 (two) times daily.  180 capsule  3  . glucosamine-chondroitin 500-400 MG tablet Take 1 tablet by mouth daily.        Marland Kitchen warfarin (COUMADIN) 5 MG tablet Take 1 tablet (5 mg total) by mouth as directed.  100 tablet  3  . DISCONTD: benazepril-hydrochlorthiazide (LOTENSIN HCT) 5-6.25 MG per tablet Take 1 tablet by mouth daily.  90 tablet  3  . DISCONTD: doxazosin (CARDURA) 1 MG tablet Take 1 tablet (1 mg total) by mouth at bedtime.  90 tablet  3  . DISCONTD: metoprolol succinate (TOPROL-XL) 25 MG 24 hr tablet 1 every am and 2 tablets every evening  270 tablet  3  . DISCONTD: pantoprazole (PROTONIX) 40 MG tablet Take 1 tablet (40 mg total) by mouth daily.  90 tablet  3  . DISCONTD: warfarin (COUMADIN) 5 MG tablet take as directed BY COUMADIN CLINIC  90 tablet  0    BP 150/80  Pulse 72  Temp 98.6 F (37 C)  Resp 16  Ht 5\' 7"  (1.702 m)  Wt 158 lb (71.668 kg)  BMI 24.75 kg/m2        Objective:   Physical Exam  Nursing note and vitals reviewed. Constitutional: He appears well-nourished. No distress.  HENT:  Head: Normocephalic and atraumatic.  Right Ear: External ear normal.  Left Ear: External ear normal.  Eyes: Conjunctivae normal are normal. Pupils are equal, round, and reactive to light.  Neck: Normal range of motion. Neck supple.  Cardiovascular:  Murmur heard. Abdominal: Bowel sounds are normal.          Assessment & Plan:  monitoring blood work  For stable problems HTN stable AF stable

## 2012-06-03 NOTE — Patient Instructions (Addendum)
The patient is instructed to continue all medications as prescribed. Schedule followup with check out clerk upon leaving the clinic  

## 2012-06-10 ENCOUNTER — Telehealth: Payer: Self-pay | Admitting: Internal Medicine

## 2012-06-10 NOTE — Telephone Encounter (Signed)
Pt informed wnl 

## 2012-06-10 NOTE — Telephone Encounter (Signed)
Pt called and said that he can not access lab results from 06/03/12. Pls call to give results to pt.

## 2012-07-04 ENCOUNTER — Telehealth: Payer: Self-pay | Admitting: Family

## 2012-07-04 NOTE — Telephone Encounter (Signed)
Needs INR recheck.

## 2012-07-05 NOTE — Telephone Encounter (Signed)
Pt states that he has his own machine and keeps track of his pt/inr. He says that he does have it checked whenever he sees Dr. Lovell Sheehan just to make sure he is keeping proper track of it

## 2012-07-11 DIAGNOSIS — I4891 Unspecified atrial fibrillation: Secondary | ICD-10-CM | POA: Diagnosis not present

## 2012-07-22 ENCOUNTER — Telehealth: Payer: Self-pay | Admitting: Family

## 2012-07-22 NOTE — Telephone Encounter (Signed)
Pt calls results in to [facility] for pt/inr management

## 2012-07-22 NOTE — Telephone Encounter (Signed)
Please call and get I results from patient. Patient is managed at home.

## 2012-07-23 ENCOUNTER — Ambulatory Visit: Payer: Self-pay | Admitting: Family

## 2012-07-23 DIAGNOSIS — I4892 Unspecified atrial flutter: Secondary | ICD-10-CM

## 2012-07-23 NOTE — Patient Instructions (Signed)
Patient calls results into [facility].

## 2012-08-09 ENCOUNTER — Other Ambulatory Visit: Payer: Self-pay | Admitting: Internal Medicine

## 2012-09-06 DIAGNOSIS — I4891 Unspecified atrial fibrillation: Secondary | ICD-10-CM | POA: Diagnosis not present

## 2012-10-05 ENCOUNTER — Other Ambulatory Visit: Payer: Self-pay

## 2012-10-14 ENCOUNTER — Ambulatory Visit (INDEPENDENT_AMBULATORY_CARE_PROVIDER_SITE_OTHER): Payer: Medicare Other | Admitting: Family Medicine

## 2012-10-14 ENCOUNTER — Ambulatory Visit: Payer: Medicare Other

## 2012-10-14 VITALS — BP 154/76 | HR 62 | Temp 98.1°F | Resp 20 | Ht 63.5 in | Wt 162.0 lb

## 2012-10-14 DIAGNOSIS — M25569 Pain in unspecified knee: Secondary | ICD-10-CM

## 2012-10-14 DIAGNOSIS — Z86718 Personal history of other venous thrombosis and embolism: Secondary | ICD-10-CM

## 2012-10-14 DIAGNOSIS — M25562 Pain in left knee: Secondary | ICD-10-CM

## 2012-10-14 NOTE — Progress Notes (Signed)
[facility]:  Office Visit  Chief Complaint:  Chief Complaint  Patient presents with  . Knee Pain    lt knee pain no known injury  known arthritis    HPI: Brian [name] is a 79 y.o. male who complains of  Posterior Left knee pain which started 3-4 days ago, he feels a knot in the back of his knee, he is not in a lot of pain, not warmer than his right. Came back from [place]  1 week ago and was sitting with his legs bent and was in a cramped space, did not notice anything for sometime, he just noticed the knot behind his knee 3-4 days ago. He denies any SOB, CP, palpitations, fatigue. He has a h/o moderate to severe OA, which he states he takes tylenol for and he can only take 2 pills. He did not take his usual dose of tylenol.  Denies gout. Has a h/o DVT.   He is already on chronic anticoagulation with coumadin for MV replacement, most recent INR 2-3 days ago was 2.2. He takes his INR at home he states.   Past Medical History  Diagnosis Date  . Osteoarthrosis, unspecified whether generalized or localized, unspecified site   . BPH (benign prostatic hypertrophy)   . Anemia   . Hemorrhage of gastrointestinal tract, unspecified   . Epistaxis   . Atrial flutter   . Hypertension   . TIA (transient ischemic attack)   . Personal history of venous thrombosis and embolism   . Rosacea    Past Surgical History  Procedure Laterality Date  . Appendectomy    . Hernia repair    . Total hip arthroplasty    . Joint replacement    . Mvp repair    . Mitral valve repair     History   Social History  . Marital Status: Married    Spouse Name: N/A    Number of Children: N/A  . Years of Education: N/A   Social History Main Topics  . Smoking status: Former Games developer  . Smokeless tobacco: None     Comment: quit 50 yr ago  . Alcohol Use: No  . Drug Use: No  . Sexually Active: None   Other Topics Concern  . None   Social History Narrative  . None   Family History   Problem Relation Age of Onset  . Mitral valve prolapse Mother   . Coronary artery disease Father    Allergies  Allergen Reactions  . Novocain (Procaine Hcl)     Irregular heart beat     . Penicillins     REACTION: itching   Prior to Admission medications   Medication Sig Start Date End Date Taking? Authorizing Provider  ALPRAZolam Prudy Feeler) 0.25 MG tablet TAKE 1 TABLET BY MOUTH AT BEDTIME AS NEEDED FOR SLEEP 08/09/12  Yes Stacie Glaze, MD  benazepril-hydrochlorthiazide (LOTENSIN HCT) 5-6.25 MG per tablet Take 1 tablet by mouth daily. 06/03/12  Yes Stacie Glaze, MD  clindamycin (CLEOCIN) 150 MG capsule Take 4 tabs prior to dental work 02/12/12  Yes Stacie Glaze, MD  doxazosin (CARDURA) 1 MG tablet Take 1 tablet (1 mg total) by mouth at bedtime. 06/03/12  Yes Stacie Glaze, MD  doxycycline (VIBRAMYCIN) 100 MG capsule Take 1 capsule (100 mg total) by mouth 2 (two) times daily. 02/12/12  Yes Stacie Glaze, MD  finasteride (PROSCAR) 5 MG tablet Take 1 tablet (5 mg total) by mouth daily.  06/03/12  Yes Stacie Glaze, MD  glucosamine-chondroitin 500-400 MG tablet Take 1 tablet by mouth daily.     Yes Historical Provider, MD  metoprolol succinate (TOPROL-XL) 25 MG 24 hr tablet 1 every am and 2 tablets every evening 06/03/12  Yes Stacie Glaze, MD  pantoprazole (PROTONIX) 40 MG tablet Take 1 tablet (40 mg total) by mouth daily. 06/03/12  Yes Stacie Glaze, MD  warfarin (COUMADIN) 5 MG tablet Take 1 tablet (5 mg total) by mouth as directed. 02/12/12  Yes Stacie Glaze, MD  warfarin (COUMADIN) 5 MG tablet Take 1 tablet (5 mg total) by mouth daily. 06/03/12  Yes Stacie Glaze, MD  warfarin (COUMADIN) 5 MG tablet Take 1 tablet (5 mg total) by mouth daily. 06/03/12  Yes Stacie Glaze, MD     ROS: The patient denies fevers, chills, night sweats, unintentional weight loss, chest pain, palpitations, wheezing, dyspnea on exertion, nausea, vomiting, abdominal pain, dysuria, hematuria, melena,  numbness, weakness, or tingling.   All other systems have been reviewed and were otherwise negative with the exception of those mentioned in the HPI and as above.    PHYSICAL EXAM: Filed Vitals:   10/14/12 2028  BP: 154/76  Pulse: 62  Temp: 98.1 F (36.7 C)  Resp: 20   Filed Vitals:   10/14/12 2028  Height: 5' 3.5" (1.613 m)  Weight: 162 lb (73.483 kg)   Body mass index is 28.24 kg/(m^2).  General: Alert, no acute distress HEENT:  Normocephalic, atraumatic, oropharynx patent.  Cardiovascular:  Regular rate and rhythm, no rubs murmurs or gallops.  No Carotid bruits, radial pulse intact. No pedal edema.  Respiratory: Clear to auscultation bilaterally.  No wheezes, rales, or rhonchi.  No cyanosis, no use of accessory musculature GI: No organomegaly, abdomen is soft and non-tender, positive bowel sounds.  No masses. Skin: No rashes. Neurologic: Facial musculature symmetric. Psychiatric: Patient is appropriate throughout our interaction. Lymphatic: No cervical lymphadenopathy Musculoskeletal: Gait is appropriate based on h/o OA Left knee-+ mobile, tender fluid filled 1 inch area posterior knee.  + crepitus, + Oa related defromities of anterior knee. Full ROM. NO warmth, sensation intact, 5/5 strength.  The is no pedal edema; No fullness, asymmetry, or pain  in bilateral calf or thigh.    LABS: Results for orders placed in visit on 06/03/12  POCT INR      Result Value Range   INR 2.3       EKG/XRAY:   Primary read interpreted by Dr. Conley Rolls at [facility]. + severe DJD   ASSESSMENT/PLAN: Encounter Diagnosis  Name Primary?  . Left knee pain Yes   Brian [name] is an extremely pleasant 79 y/o white male who is here for eval of a 3-4 day h/o left posterior knee pain. He is slightly anxious and is seeking assurance that this is not a blood clot.   ? Popliteal cyst vs arthritic exacerbated changes and fluid collection vs possible calcified venous changes from varicose veins vs less  likely DVT Patient does have a h/o DVT, he is currently on chronic anticoagulation with coumadin for MV replacement.  INR was therapeutic at 2.2 a few days ago.  Will get Kima Malenfant Doppler  rule out popliteal cyst vs less likely DVT which he is already on meds for. Patient prefers Thursday AM.  Advise not to use compression/do any exertional leg activities at this time.  Go to ER prn     Brian Dinkins PHUONG, DO 10/14/2012 9:26 PM

## 2012-10-18 ENCOUNTER — Encounter (INDEPENDENT_AMBULATORY_CARE_PROVIDER_SITE_OTHER): Payer: Medicare Other

## 2012-10-18 DIAGNOSIS — Z86718 Personal history of other venous thrombosis and embolism: Secondary | ICD-10-CM

## 2012-10-18 DIAGNOSIS — M79609 Pain in unspecified limb: Secondary | ICD-10-CM

## 2012-10-18 DIAGNOSIS — M25562 Pain in left knee: Secondary | ICD-10-CM

## 2012-10-24 ENCOUNTER — Encounter: Payer: Self-pay | Admitting: Family Medicine

## 2012-10-24 ENCOUNTER — Telehealth: Payer: Self-pay | Admitting: Radiology

## 2012-10-24 NOTE — Telephone Encounter (Signed)
Message copied by Caffie Damme on Thu Oct 24, 2012  1:36 PM ------      Message from: Hamilton Capri P      Created: Thu Oct 24, 2012 11:51 AM       Let him know that US doppler showed a popliteal cyst. NO e/o DVT.  ------

## 2012-10-24 NOTE — Telephone Encounter (Signed)
Called patient to advise. [phone] is his number. He is advise there is no blood clot.

## 2012-10-29 DIAGNOSIS — I4891 Unspecified atrial fibrillation: Secondary | ICD-10-CM | POA: Diagnosis not present

## 2012-11-04 ENCOUNTER — Ambulatory Visit (INDEPENDENT_AMBULATORY_CARE_PROVIDER_SITE_OTHER): Payer: Medicare Other | Admitting: Internal Medicine

## 2012-11-04 ENCOUNTER — Encounter: Payer: Self-pay | Admitting: Internal Medicine

## 2012-11-04 ENCOUNTER — Ambulatory Visit (INDEPENDENT_AMBULATORY_CARE_PROVIDER_SITE_OTHER): Payer: Medicare Other | Admitting: Family

## 2012-11-04 VITALS — BP 136/80 | HR 68 | Temp 98.0°F | Wt 161.0 lb

## 2012-11-04 DIAGNOSIS — Z9889 Other specified postprocedural states: Secondary | ICD-10-CM

## 2012-11-04 DIAGNOSIS — M7120 Synovial cyst of popliteal space [Baker], unspecified knee: Secondary | ICD-10-CM

## 2012-11-04 DIAGNOSIS — I4892 Unspecified atrial flutter: Secondary | ICD-10-CM | POA: Diagnosis not present

## 2012-11-04 DIAGNOSIS — M7121 Synovial cyst of popliteal space [Baker], right knee: Secondary | ICD-10-CM

## 2012-11-04 DIAGNOSIS — M7122 Synovial cyst of popliteal space [Baker], left knee: Secondary | ICD-10-CM

## 2012-11-04 DIAGNOSIS — I10 Essential (primary) hypertension: Secondary | ICD-10-CM

## 2012-11-04 DIAGNOSIS — E785 Hyperlipidemia, unspecified: Secondary | ICD-10-CM

## 2012-11-04 LAB — LIPID PANEL
HDL: 41.8 mg/dL (ref >39.00)
Triglycerides: 70 mg/dL (ref 0.0–149.0)
VLDL: 14 mg/dL (ref 0.0–40.0)

## 2012-11-04 MED ORDER — WARFARIN SODIUM 5 MG PO TABS: 5.0000 mg | ORAL_TABLET | Freq: Every day | ORAL | Status: DC

## 2012-11-04 MED ORDER — ALPRAZOLAM 0.25 MG PO TABS: ORAL_TABLET | ORAL | Status: DC

## 2012-11-04 MED ORDER — PANTOPRAZOLE SODIUM 40 MG PO TBEC: 40.0000 mg | DELAYED_RELEASE_TABLET | Freq: Every day | ORAL | Status: DC

## 2012-11-04 MED ORDER — BENAZEPRIL-HYDROCHLOROTHIAZIDE 5-6.25 MG PO TABS: 1.0000 | ORAL_TABLET | Freq: Every day | ORAL | Status: DC

## 2012-11-04 MED ORDER — FINASTERIDE 5 MG PO TABS: 5.0000 mg | ORAL_TABLET | Freq: Every day | ORAL | Status: DC

## 2012-11-04 MED ORDER — DOXAZOSIN MESYLATE 1 MG PO TABS: 1.0000 mg | ORAL_TABLET | Freq: Every day | ORAL | Status: DC

## 2012-11-04 MED ORDER — METOPROLOL SUCCINATE ER 25 MG PO TB24: ORAL_TABLET | ORAL | Status: DC

## 2012-11-04 NOTE — Progress Notes (Signed)
  Subjective:    Patient ID: Brian [name], male    DOB: 02-23-1933, 79 y.o.   MRN: [id number]  HPI    Review of Systems     Objective:   Physical Exam    Large left bakers cyst    Assessment & Plan:  Refer to ortho for larger left bakers cyst

## 2012-11-04 NOTE — Patient Instructions (Addendum)
Monitors at home

## 2012-11-04 NOTE — Progress Notes (Signed)
Subjective:    Patient ID: Brian [name], male    DOB: 07-06-1933, 79 y.o.   MRN: [id number]  HPI This is a 79 year old male who presents for followup of hypertension hyperlipidemia history of mitral valve replacement and a history of atrial flutter. He is currently on Coumadin therapy status post mitral valve replacement.     Review of Systems  Constitutional: Negative for fever and fatigue.  HENT: Negative for hearing loss, congestion, neck pain and postnasal drip.   Eyes: Negative for discharge, redness and visual disturbance.  Respiratory: Negative for cough, shortness of breath and wheezing.   Cardiovascular: Negative for leg swelling.  Gastrointestinal: Negative for abdominal pain, constipation and abdominal distention.  Genitourinary: Negative for urgency and frequency.  Musculoskeletal: Negative for joint swelling and arthralgias.  Skin: Negative for color change and rash.  Neurological: Negative for weakness and light-headedness.  Hematological: Negative for adenopathy.  Psychiatric/Behavioral: Negative for behavioral problems.   Past Medical History  Diagnosis Date  . Osteoarthrosis, unspecified whether generalized or localized, unspecified site   . BPH (benign prostatic hypertrophy)   . Anemia   . Hemorrhage of gastrointestinal tract, unspecified   . Epistaxis   . Atrial flutter   . Hypertension   . TIA (transient ischemic attack)   . Personal history of venous thrombosis and embolism   . Rosacea     History   Social History  . Marital Status: Married    Spouse Name: N/A    Number of Children: N/A  . Years of Education: N/A   Occupational History  . Not on file.   Social History Main Topics  . Smoking status: Former Games developer  . Smokeless tobacco: Not on file     Comment: quit 50 yr ago  . Alcohol Use: No  . Drug Use: No  . Sexually Active: Not on file   Other Topics Concern  . Not on file   Social History Narrative  . No narrative on file     Past Surgical History  Procedure Laterality Date  . Appendectomy    . Hernia repair    . Total hip arthroplasty    . Joint replacement    . Mvp repair    . Mitral valve repair      Family History  Problem Relation Age of Onset  . Mitral valve prolapse Mother   . Coronary artery disease Father     Allergies  Allergen Reactions  . Novocain (Procaine Hcl)     Irregular heart beat     . Penicillins     REACTION: itching    Current Outpatient Prescriptions on File Prior to Visit  Medication Sig Dispense Refill  . ALPRAZolam (XANAX) 0.25 MG tablet TAKE 1 TABLET BY MOUTH AT BEDTIME AS NEEDED FOR SLEEP  90 tablet  0  . benazepril-hydrochlorthiazide (LOTENSIN HCT) 5-6.25 MG per tablet Take 1 tablet by mouth daily.  90 tablet  3  . clindamycin (CLEOCIN) 150 MG capsule Take 4 tabs prior to dental work  12 capsule  3  . doxazosin (CARDURA) 1 MG tablet Take 1 tablet (1 mg total) by mouth at bedtime.  90 tablet  3  . doxycycline (VIBRAMYCIN) 100 MG capsule Take 1 capsule (100 mg total) by mouth 2 (two) times daily.  180 capsule  3  . finasteride (PROSCAR) 5 MG tablet Take 1 tablet (5 mg total) by mouth daily.  90 tablet  3  . glucosamine-chondroitin 500-400 MG tablet Take 1 tablet by mouth  daily.        . metoprolol succinate (TOPROL-XL) 25 MG 24 hr tablet 1 every am and 2 tablets every evening  270 tablet  3  . pantoprazole (PROTONIX) 40 MG tablet Take 1 tablet (40 mg total) by mouth daily.  90 tablet  3  . warfarin (COUMADIN) 5 MG tablet Take 1 tablet (5 mg total) by mouth as directed.  100 tablet  3  . warfarin (COUMADIN) 5 MG tablet Take 1 tablet (5 mg total) by mouth daily.  90 tablet  3  . warfarin (COUMADIN) 5 MG tablet Take 1 tablet (5 mg total) by mouth daily.  90 tablet  3   No current facility-administered medications on file prior to visit.           Objective:   Physical Exam  Nursing note and vitals reviewed. Constitutional: He appears well-developed and  well-nourished.  HENT:  Head: Normocephalic and atraumatic.  Eyes: Conjunctivae are normal. Pupils are equal, round, and reactive to light.  Neck: Normal range of motion. Neck supple.  Cardiovascular:  Murmur heard. Pulmonary/Chest: Effort normal and breath sounds normal.  Abdominal: Soft. Bowel sounds are normal.  Skin: Skin is warm and dry.          Assessment & Plan:  The patient was seen in sat clinic after sitting in a car seat for 3.5 hours and was seen for leg pain and was diagnosed with baker's cyst.  Referral to [facility] for  Larger  bakers cyst  Stable CAD, AF and HTN Good coumadin management   Stable MVR  Coumadin check today Due lipid monitering

## 2012-11-04 NOTE — Patient Instructions (Signed)
The patient is instructed to continue all medications as prescribed. Schedule followup with check out clerk upon leaving the clinic Baker's Cyst A Baker's cyst is a swelling that forms in the back of the knee. It is a sac-like structure. It is filled with the same fluid that is located in your knee. The fluid located in your knee is necessary because it lubricates the bones and cartilage. It allows them to move over each other more easily. CAUSES  When the knee becomes injured or has soreness (inflammation) present, more fluid forms in the knee. When this happens, the joint lining is pushed out behind the knee and forms the baker's cyst. This cyst may also be caused by inflammation from arthritic conditions and infections. DIAGNOSIS  A Baker's cyst is most often diagnosed with an ultrasound. This is a specialized picture (like an X-ray). It shows a picture by using sound waves. Sometimes a specialized x-ray called an MRI (magnetic resonance imaging) is used. This picks up other problems within a joint if an ultrasound alone cannot make the diagnosis. If the cyst came immediately following an injury, plain x-rays may be used to make a diagnosis. TREATMENT  The treatment depends on the cause of the cyst. But most of these cysts are caused by an inflammation. Anti-inflammatory medications and rest often will get rid of the problem. If the cyst is caused by an infection, medications (antibiotics) will be prescribed to help this. Take the medications as directed. Refer to Home Care Instructions, below, for additional treatment suggestions. HOME CARE INSTRUCTIONS   If the cyst was caused by an injury, for the first 24 hours, while lying down, keep the injured extremity elevated on 2 pillows.  For the first 24 hours while you are awake, apply ice bags (ice in a plastic bag with a towel around it to prevent frostbite to skin) 3 to 4 times per day for 15 to 20 minutes to the injured area. Then do as directed by  your caregiver.  Only take over-the-counter or prescription medicines for pain, discomfort, or fever as directed by your caregiver. Persistent pain and inability to use the injured area for more than 2 to 3 days are warning signs indicating that you should see a caregiver for a follow-up visit as soon as possible. Persistent pain and swelling indicate that further evaluation, non-weight bearing (use of crutches as instructed), and/or further x-rays are needed. Make a follow-up appointment with your own caregiver. If conservative measures (rest, medications and inactivity) do not help the problem get better, sometimes surgery for removal of the cyst is needed. Reasons for this may be that the cyst is pressing on nerves and/or vessels and causing problems which cannot wait for improvement with conservative treatment. If the problem is caused by injuries to the cartilage in the knee, surgery is often needed for treatment of that problem. MAKE SURE YOU:   Understand these instructions.  Will watch your condition.  Will get help right away if you are not doing well or get worse. Document Released: 08/07/2005 Document Revised: 10/30/2011 Document Reviewed: 03/25/2008 [facility] Patient Information 2013 [place], [place].

## 2012-11-06 ENCOUNTER — Other Ambulatory Visit: Payer: Self-pay | Admitting: Internal Medicine

## 2012-11-11 ENCOUNTER — Telehealth: Payer: Self-pay

## 2012-11-11 NOTE — Telephone Encounter (Signed)
Pt requesting lab results and a copy of echo that was done. Please call pt

## 2012-11-11 NOTE — Telephone Encounter (Signed)
Left message on machine Doppler showed no clot but does have bakers cyst- send to orth and labs were al ok- pt to call back with name of orth he would like to go to

## 2012-11-22 ENCOUNTER — Ambulatory Visit: Payer: Medicare Other | Admitting: Cardiology

## 2012-11-29 DIAGNOSIS — M19019 Primary osteoarthritis, unspecified shoulder: Secondary | ICD-10-CM | POA: Diagnosis not present

## 2012-12-16 ENCOUNTER — Other Ambulatory Visit: Payer: Self-pay | Admitting: Internal Medicine

## 2012-12-17 DIAGNOSIS — I4891 Unspecified atrial fibrillation: Secondary | ICD-10-CM | POA: Diagnosis not present

## 2013-01-27 DIAGNOSIS — I4891 Unspecified atrial fibrillation: Secondary | ICD-10-CM | POA: Diagnosis not present

## 2013-01-29 DIAGNOSIS — I4891 Unspecified atrial fibrillation: Secondary | ICD-10-CM | POA: Diagnosis not present

## 2013-01-29 DIAGNOSIS — I10 Essential (primary) hypertension: Secondary | ICD-10-CM | POA: Diagnosis not present

## 2013-02-07 ENCOUNTER — Encounter: Payer: Self-pay | Admitting: Cardiology

## 2013-02-07 ENCOUNTER — Ambulatory Visit (INDEPENDENT_AMBULATORY_CARE_PROVIDER_SITE_OTHER): Payer: Medicare Other | Admitting: Cardiology

## 2013-02-07 VITALS — BP 108/62 | HR 97 | Ht 65.0 in | Wt 161.1 lb

## 2013-02-07 DIAGNOSIS — Z9889 Other specified postprocedural states: Secondary | ICD-10-CM

## 2013-02-07 DIAGNOSIS — I10 Essential (primary) hypertension: Secondary | ICD-10-CM

## 2013-02-07 DIAGNOSIS — I059 Rheumatic mitral valve disease, unspecified: Secondary | ICD-10-CM

## 2013-02-07 DIAGNOSIS — I4892 Unspecified atrial flutter: Secondary | ICD-10-CM

## 2013-02-07 NOTE — Progress Notes (Signed)
HPI: Brian [name] is a pleasant gentleman who has a history of mitral valve repair as well as atrial flutter (patient has declined ablation in past; also with h/o CVA). A Myoview was performed in June 2005. Ejection fraction was 64%. There was a nonreversible inferior defect which was felt to be infarct versus diaphragm. There was no ischemia. His last echocardiogram was performed in March of 2011. At that time, he had normal LV function. There is mild aortic insufficiency and mitral regurgitation. Left atrium is mildly dilated. There was also right atrial and right ventricular enlargement and moderate tricuspid regurgitation. I last saw him in Jan 2012. Since then he denies any dyspnea on exertion, orthopnea, PND, pedal edema, palpitations, syncope or chest pain.   Current Outpatient Prescriptions  Medication Sig Dispense Refill  . ALPRAZolam (XANAX) 0.25 MG tablet TAKE 1 TABLET BY MOUTH AT BEDTIME AS NEEDED FOR SLEEP  90 tablet  1  . benazepril-hydrochlorthiazide (LOTENSIN HCT) 5-6.25 MG per tablet Take 1 tablet by mouth daily.  90 tablet  3  . clindamycin (CLEOCIN) 150 MG capsule TAKE 4 CAPSULES BY MOUTH PRIOR TO DENTAL WORK  12 capsule  0  . doxazosin (CARDURA) 1 MG tablet Take 1 tablet (1 mg total) by mouth at bedtime.  90 tablet  3  . finasteride (PROSCAR) 5 MG tablet Take 1 tablet (5 mg total) by mouth daily.  90 tablet  3  . metoprolol succinate (TOPROL-XL) 25 MG 24 hr tablet 1 every am and 2 tablets every evening  270 tablet  3  . pantoprazole (PROTONIX) 40 MG tablet Take 1 tablet (40 mg total) by mouth daily.  90 tablet  3  . warfarin (COUMADIN) 5 MG tablet Take 1 tablet (5 mg total) by mouth daily.  90 tablet  3   No current facility-administered medications for this visit.     Past Medical History  Diagnosis Date  . Osteoarthrosis, unspecified whether generalized or localized, unspecified site   . BPH (benign prostatic hypertrophy)   . Anemia   . Hemorrhage of gastrointestinal  tract, unspecified   . Epistaxis   . Atrial flutter   . Hypertension   . TIA (transient ischemic attack)   . Personal history of venous thrombosis and embolism   . Rosacea     Past Surgical History  Procedure Laterality Date  . Appendectomy    . Hernia repair    . Total hip arthroplasty    . Joint replacement    . Mvp repair    . Mitral valve repair      History   Social History  . Marital Status: Married    Spouse Name: N/A    Number of Children: N/A  . Years of Education: N/A   Occupational History  . Not on file.   Social History Main Topics  . Smoking status: Former Games developer  . Smokeless tobacco: Not on file     Comment: quit 50 yr ago  . Alcohol Use: No  . Drug Use: No  . Sexually Active: Not on file   Other Topics Concern  . Not on file   Social History Narrative  . No narrative on file    ROS: no fevers or chills, productive cough, hemoptysis, dysphasia, odynophagia, melena, hematochezia, dysuria, hematuria, rash, seizure activity, orthopnea, PND, pedal edema, claudication. Remaining systems are negative.  Physical Exam: Well-developed well-nourished in no acute distress.  Skin is warm and dry.  HEENT is normal.  Neck is supple.  Chest is clear to auscultation with normal expansion.  Cardiovascular exam is regular rate and rhythm.  Abdominal exam nontender or distended. No masses palpated. Extremities show no edema. neuro grossly intact  ECG probable atrial flutter with a rate of 97. Left ventricular hypertrophy, left anterior fascicular block, no ST changes.

## 2013-02-07 NOTE — Patient Instructions (Addendum)

## 2013-02-07 NOTE — Assessment & Plan Note (Signed)
Blood pressure controlled. Continue present medications. Potassium and renal function monitored by primary care. 

## 2013-02-07 NOTE — Assessment & Plan Note (Signed)
The patient appears to potentially be in atrial flutter today although difficult to tell. However he has declined ablation in the past and he is asymptomatic. His rate is controlled. Continue beta blocker. Continue Coumadin. He monitors his Coumadin himself with a home machine. His hemoglobin is monitored by primary care.

## 2013-02-07 NOTE — Assessment & Plan Note (Signed)
History of mitral valve repair. Continue SBE prophylaxis. Repeat echocardiogram.

## 2013-02-14 ENCOUNTER — Telehealth: Payer: Self-pay | Admitting: Family

## 2013-02-14 NOTE — Telephone Encounter (Signed)
Pt says that he has an appointment with Dr. Lovell Sheehan in July and will have it checked at that time but his home readings have been within normal range

## 2013-02-14 NOTE — Telephone Encounter (Signed)
Call and document home INR readings.

## 2013-03-07 ENCOUNTER — Telehealth: Payer: Self-pay | Admitting: Internal Medicine

## 2013-03-07 NOTE — Telephone Encounter (Signed)
Per dr Jannette Spanner not need to stop coumadin-pt informed

## 2013-03-07 NOTE — Telephone Encounter (Signed)
PT called and stated that he is coming in to see Dr. Lovell Sheehan on 7/30. He states that he has a mole that needs removed, and Dr. Lovell Sheehan has done this for him before. Last time, Dr. Lovell Sheehan required him to come off of his coumadin prior to the procedure. Is it even possible that Dr. Lovell Sheehan would remove it on 7/30? If so, can we give him instructions for his medication. Please assist.

## 2013-03-08 ENCOUNTER — Other Ambulatory Visit: Payer: Self-pay | Admitting: Internal Medicine

## 2013-03-10 ENCOUNTER — Ambulatory Visit: Payer: Medicare Other | Admitting: Internal Medicine

## 2013-03-12 ENCOUNTER — Ambulatory Visit (HOSPITAL_COMMUNITY): Payer: Medicare Other | Attending: Cardiology | Admitting: Radiology

## 2013-03-12 DIAGNOSIS — I4891 Unspecified atrial fibrillation: Secondary | ICD-10-CM | POA: Diagnosis not present

## 2013-03-12 DIAGNOSIS — Z86718 Personal history of other venous thrombosis and embolism: Secondary | ICD-10-CM | POA: Insufficient documentation

## 2013-03-12 DIAGNOSIS — I4892 Unspecified atrial flutter: Secondary | ICD-10-CM

## 2013-03-12 DIAGNOSIS — Z87891 Personal history of nicotine dependence: Secondary | ICD-10-CM | POA: Diagnosis not present

## 2013-03-12 DIAGNOSIS — I059 Rheumatic mitral valve disease, unspecified: Secondary | ICD-10-CM | POA: Diagnosis not present

## 2013-03-12 DIAGNOSIS — I2510 Atherosclerotic heart disease of native coronary artery without angina pectoris: Secondary | ICD-10-CM

## 2013-03-12 DIAGNOSIS — I10 Essential (primary) hypertension: Secondary | ICD-10-CM | POA: Diagnosis not present

## 2013-03-12 NOTE — Progress Notes (Signed)
Echocardiogram performed. Patient appears to be in atrial fibrillation.

## 2013-03-14 DIAGNOSIS — M7120 Synovial cyst of popliteal space [Baker], unspecified knee: Secondary | ICD-10-CM | POA: Diagnosis not present

## 2013-03-14 DIAGNOSIS — M25569 Pain in unspecified knee: Secondary | ICD-10-CM | POA: Diagnosis not present

## 2013-03-19 ENCOUNTER — Ambulatory Visit (INDEPENDENT_AMBULATORY_CARE_PROVIDER_SITE_OTHER): Payer: Medicare Other | Admitting: Internal Medicine

## 2013-03-19 ENCOUNTER — Encounter: Payer: Self-pay | Admitting: Internal Medicine

## 2013-03-19 VITALS — BP 130/80 | HR 88 | Temp 98.2°F | Resp 16 | Ht 65.0 in | Wt 160.0 lb

## 2013-03-19 DIAGNOSIS — I059 Rheumatic mitral valve disease, unspecified: Secondary | ICD-10-CM | POA: Diagnosis not present

## 2013-03-19 DIAGNOSIS — I4892 Unspecified atrial flutter: Secondary | ICD-10-CM

## 2013-03-19 DIAGNOSIS — I10 Essential (primary) hypertension: Secondary | ICD-10-CM

## 2013-03-19 MED ORDER — CLINDAMYCIN HCL 150 MG PO CAPS: ORAL_CAPSULE | ORAL | Status: DC

## 2013-03-19 MED ORDER — DOXAZOSIN MESYLATE 1 MG PO TABS: 1.0000 mg | ORAL_TABLET | Freq: Every day | ORAL | Status: DC

## 2013-03-19 MED ORDER — FINASTERIDE 5 MG PO TABS: 5.0000 mg | ORAL_TABLET | Freq: Every day | ORAL | Status: DC

## 2013-03-19 MED ORDER — METOPROLOL SUCCINATE ER 25 MG PO TB24: ORAL_TABLET | ORAL | Status: DC

## 2013-03-19 MED ORDER — PANTOPRAZOLE SODIUM 40 MG PO TBEC: 40.0000 mg | DELAYED_RELEASE_TABLET | Freq: Every day | ORAL | Status: DC

## 2013-03-19 MED ORDER — WARFARIN SODIUM 5 MG PO TABS: 5.0000 mg | ORAL_TABLET | Freq: Every day | ORAL | Status: DC

## 2013-03-19 MED ORDER — BENAZEPRIL-HYDROCHLOROTHIAZIDE 5-6.25 MG PO TABS: 1.0000 | ORAL_TABLET | Freq: Every day | ORAL | Status: DC

## 2013-03-19 MED ORDER — ALPRAZOLAM 0.25 MG PO TABS: ORAL_TABLET | ORAL | Status: DC

## 2013-03-19 NOTE — Progress Notes (Signed)
Subjective:    Patient ID: Brian [name], male    DOB: 1933/08/11, 79 y.o.   MRN: [id number]  HPI  Patient is a 79 year old male who presents for followup of hypertension history of atrial fibrillation and moderate to severe osteoarthritis.  He has a chief complaint today of some growing moles in the groin area that were examined and felt to be seborrheic keratoses.  We discussed seborrheic keratoses and age and feel that they're not appropriate for biopsy at this point.  His blood pressure is stable he is stable on his current medication he has a recent cardiovascular evaluation echocardiogram shows valve to be stable  Review of Systems  Constitutional: Negative for fever and fatigue.  HENT: Negative for hearing loss, congestion, neck pain and postnasal drip.   Eyes: Negative for discharge, redness and visual disturbance.  Respiratory: Negative for cough, shortness of breath and wheezing.   Cardiovascular: Negative for leg swelling.  Gastrointestinal: Negative for abdominal pain, constipation and abdominal distention.  Genitourinary: Negative for urgency and frequency.  Musculoskeletal: Negative for joint swelling and arthralgias.  Skin: Negative for color change and rash.  Neurological: Negative for weakness and light-headedness.  Hematological: Negative for adenopathy.  Psychiatric/Behavioral: Negative for behavioral problems.   Past Medical History  Diagnosis Date  . Osteoarthrosis, unspecified whether generalized or localized, unspecified site   . BPH (benign prostatic hypertrophy)   . Anemia   . Hemorrhage of gastrointestinal tract, unspecified   . Epistaxis   . Atrial flutter   . Hypertension   . TIA (transient ischemic attack)   . Personal history of venous thrombosis and embolism   . Rosacea     History   Social History  . Marital Status: Married    Spouse Name: N/A    Number of Children: N/A  . Years of Education: N/A   Occupational History  . Not on  file.   Social History Main Topics  . Smoking status: Former Games developer  . Smokeless tobacco: Not on file     Comment: quit 50 yr ago  . Alcohol Use: No  . Drug Use: No  . Sexually Active: Not on file   Other Topics Concern  . Not on file   Social History Narrative  . No narrative on file    Past Surgical History  Procedure Laterality Date  . Appendectomy    . Hernia repair    . Total hip arthroplasty    . Joint replacement    . Mvp repair    . Mitral valve repair      Family History  Problem Relation Age of Onset  . Mitral valve prolapse Mother   . Coronary artery disease Father     Allergies  Allergen Reactions  . Novocain (Procaine Hcl)     Irregular heart beat     . Penicillins     REACTION: itching    Current Outpatient Prescriptions on File Prior to Visit  Medication Sig Dispense Refill  . doxycycline (VIBRA-TABS) 100 MG tablet TAKE 1 TABLET BY MOUTH TWICE DAILY  180 tablet  3   No current facility-administered medications on file prior to visit.    BP 130/80  Pulse 88  Temp(Src) 98.2 F (36.8 C)  Resp 16  Ht 5\' 5"  (1.651 m)  Wt 160 lb (72.576 kg)  BMI 26.63 kg/m2       Objective:   Physical Exam  Constitutional: He appears well-developed and well-nourished.  HENT:  Head: Normocephalic and atraumatic.  Eyes: Conjunctivae are normal. Pupils are equal, round, and reactive to light.  Neck: Normal range of motion. Neck supple.  Cardiovascular: Normal rate and regular rhythm.   Murmur heard. Pulmonary/Chest: Effort normal and breath sounds normal.  Abdominal: Soft. Bowel sounds are normal.          Assessment & Plan:  Stable hypertension.  Stable arrhythmia Coumadin checked today.  Followup with Medicare wellness in 3-4 months at which time we'll be appropriate to measure PSA lipid liver panel complete metabolic panel.  The patient is doing remarkably well from a cardiovascular standpoint he does have moderate to severe osteoarthritis  we are recommending mobility exercise continue to work on balance strengthening

## 2013-03-20 DIAGNOSIS — I4891 Unspecified atrial fibrillation: Secondary | ICD-10-CM | POA: Diagnosis not present

## 2013-04-25 DIAGNOSIS — Z23 Encounter for immunization: Secondary | ICD-10-CM | POA: Diagnosis not present

## 2013-05-07 DIAGNOSIS — I4891 Unspecified atrial fibrillation: Secondary | ICD-10-CM | POA: Diagnosis not present

## 2013-06-09 ENCOUNTER — Other Ambulatory Visit: Payer: Self-pay | Admitting: Internal Medicine

## 2013-06-20 ENCOUNTER — Other Ambulatory Visit: Payer: Self-pay | Admitting: Internal Medicine

## 2013-06-20 DIAGNOSIS — H43399 Other vitreous opacities, unspecified eye: Secondary | ICD-10-CM | POA: Diagnosis not present

## 2013-06-20 DIAGNOSIS — H04129 Dry eye syndrome of unspecified lacrimal gland: Secondary | ICD-10-CM | POA: Diagnosis not present

## 2013-06-20 DIAGNOSIS — H5710 Ocular pain, unspecified eye: Secondary | ICD-10-CM | POA: Diagnosis not present

## 2013-06-20 DIAGNOSIS — H16229 Keratoconjunctivitis sicca, not specified as Sjogren's, unspecified eye: Secondary | ICD-10-CM | POA: Diagnosis not present

## 2013-07-01 DIAGNOSIS — I4891 Unspecified atrial fibrillation: Secondary | ICD-10-CM | POA: Diagnosis not present

## 2013-07-19 ENCOUNTER — Other Ambulatory Visit: Payer: Self-pay | Admitting: Internal Medicine

## 2013-07-21 ENCOUNTER — Encounter: Payer: Self-pay | Admitting: Internal Medicine

## 2013-07-21 ENCOUNTER — Ambulatory Visit (INDEPENDENT_AMBULATORY_CARE_PROVIDER_SITE_OTHER): Payer: Medicare Other | Admitting: Internal Medicine

## 2013-07-21 VITALS — BP 140/80 | HR 68 | Temp 97.8°F | Ht 64.75 in | Wt 164.0 lb

## 2013-07-21 DIAGNOSIS — I10 Essential (primary) hypertension: Secondary | ICD-10-CM

## 2013-07-21 DIAGNOSIS — T887XXA Unspecified adverse effect of drug or medicament, initial encounter: Secondary | ICD-10-CM | POA: Diagnosis not present

## 2013-07-21 DIAGNOSIS — Z23 Encounter for immunization: Secondary | ICD-10-CM

## 2013-07-21 DIAGNOSIS — Z0000 Encounter for general adult medical examination without abnormal findings: Secondary | ICD-10-CM | POA: Diagnosis not present

## 2013-07-21 DIAGNOSIS — N400 Enlarged prostate without lower urinary tract symptoms: Secondary | ICD-10-CM | POA: Diagnosis not present

## 2013-07-21 DIAGNOSIS — I4892 Unspecified atrial flutter: Secondary | ICD-10-CM | POA: Diagnosis not present

## 2013-07-21 DIAGNOSIS — E785 Hyperlipidemia, unspecified: Secondary | ICD-10-CM | POA: Diagnosis not present

## 2013-07-21 DIAGNOSIS — Z9889 Other specified postprocedural states: Secondary | ICD-10-CM

## 2013-07-21 DIAGNOSIS — L719 Rosacea, unspecified: Secondary | ICD-10-CM

## 2013-07-21 LAB — LIPID PANEL
Cholesterol: 166 mg/dL (ref 0–200)
HDL: 51.9 mg/dL (ref >39.00)
LDL Cholesterol: 103 mg/dL — ABNORMAL HIGH (ref 0–99)
Total CHOL/HDL Ratio: 3

## 2013-07-21 LAB — CBC WITH DIFFERENTIAL/PLATELET
Basophils Relative: 0.2 % (ref 0.0–3.0)
Eosinophils Relative: 0.2 % (ref 0.0–5.0)
Lymphocytes Relative: 21.3 % (ref 12.0–46.0)
MCHC: 33.8 g/dL (ref 30.0–36.0)
MCV: 88.4 fl (ref 78.0–100.0)
Monocytes Absolute: 0.5 10*3/uL (ref 0.1–1.0)
Monocytes Relative: 9.2 % (ref 3.0–12.0)
Neutrophils Relative %: 69.1 % (ref 43.0–77.0)
Platelets: 188 10*3/uL (ref 150.0–400.0)
RBC: 4.88 Mil/uL (ref 4.22–5.81)
WBC: 5.6 10*3/uL (ref 4.5–10.5)

## 2013-07-21 LAB — BASIC METABOLIC PANEL
BUN: 17 mg/dL (ref 6–23)
CO2: 29 mEq/L (ref 19–32)
Chloride: 103 mEq/L (ref 96–112)
Creatinine, Ser: 0.7 mg/dL (ref 0.4–1.5)
Potassium: 4.7 mEq/L (ref 3.5–5.1)

## 2013-07-21 LAB — POCT URINALYSIS DIPSTICK
Glucose, UA: NEGATIVE
Leukocytes, UA: NEGATIVE
Nitrite, UA: NEGATIVE
Spec Grav, UA: 1.02
Urobilinogen, UA: 0.2

## 2013-07-21 LAB — HEPATIC FUNCTION PANEL
ALT: 29 U/L (ref 0–53)
AST: 31 U/L (ref 0–37)
Albumin: 3.9 g/dL (ref 3.5–5.2)
Alkaline Phosphatase: 83 U/L (ref 39–117)
Total Bilirubin: 0.6 mg/dL (ref 0.3–1.2)

## 2013-07-21 LAB — TSH: TSH: 2.2 u[IU]/mL (ref 0.35–5.50)

## 2013-07-21 NOTE — Patient Instructions (Signed)
The patient is instructed to continue all medications as prescribed. Schedule followup with check out clerk upon leaving the clinic  

## 2013-07-21 NOTE — Progress Notes (Signed)
Pre visit review using our clinic review tool, if applicable. No additional management support is needed unless otherwise documented below in the visit note. 

## 2013-07-21 NOTE — Progress Notes (Signed)
Subjective:    Patient ID: Brian [name], male    DOB: [birth year], 79 y.o.   MRN: [id number]  Hypertension Associated symptoms include headaches and palpitations.   Value repair stable and HTN and AFlutter stable   Review of Systems  Constitutional: Positive for fatigue.  Eyes: Negative.   Respiratory: Negative.   Cardiovascular: Positive for palpitations.  Gastrointestinal: Negative.   Endocrine: Negative.   Genitourinary: Negative.   Neurological: Positive for weakness and headaches.  Hematological: Negative.   Psychiatric/Behavioral: Negative.        Objective:   Physical Exam  Nursing note and vitals reviewed. Constitutional: He appears well-developed and well-nourished.  HENT:  Head: Normocephalic.  Eyes: Conjunctivae are normal. Pupils are equal, round, and reactive to light.  Neck: Neck supple.  Cardiovascular:  Murmur heard. Irregular rate  Abdominal: Bowel sounds are normal.  Genitourinary: Rectum normal.  BPH  60 gram  Neurological: He displays normal reflexes. Coordination abnormal.  Psychiatric: He has a normal mood and affect. His behavior is normal.          Assessment & Plan:  Stable flutter Value replacement stable monitor screening labs for HTN lipids and BPH CBC and PT due to anticoagulation  Subjective:    Brian [name] is a 79 y.o. male who presents for Medicare Annual/Subsequent preventive examination.   Preventive Screening-Counseling & Management  Tobacco History  Smoking status  . Former Smoker  Smokeless tobacco  . Not on file    Comment: quit 50 yr ago    Problems Prior to Visit 1.   Current Problems (verified) Patient Active Problem List   Diagnosis Date Noted  . ROTATOR CUFF SYNDROME, LEFT 03/07/2010  . OTH MALIG NEOPLASM SKIN OTH&UNSPEC PARTS FACE 12/06/2009  . ACTINIC KERATOSIS, HEAD 09/06/2009  . DEGENERATIVE JOINT DISEASE 02/20/2009  . SYNCOPE, HX OF 02/20/2009  . MITRAL VALVE REPLACEMENT, HX OF  02/20/2009  . HIP REPLACEMENT, TOTAL, HX OF 02/20/2009  . APPENDECTOMY, HX OF 02/20/2009  . HERNIORRHAPHY, HX OF 02/20/2009  . EPISTAXIS, RECURRENT 11/19/2008  . UNS ADVRS EFF UNS RX MEDICINAL&BIOLOGICAL SBSTNC 06/12/2008  . OTHER AND UNSPECIFIED MITRAL VALVE DISEASES 03/12/2008  . ATRIAL FLUTTER 03/12/2008  . HYPERTENSION 03/06/2007  . PEPTIC ULCER DISEASE 03/06/2007  . BENIGN PROSTATIC HYPERTROPHY 03/06/2007  . CORONARY ARTERY DISEASE 02/13/2007  . ROSACEA 02/13/2007  . TRANSIENT ISCHEMIC ATTACK, HX OF 02/13/2007  . DVT, HX OF 02/13/2007    Medications Prior to Visit Current Outpatient Prescriptions on File Prior to Visit  Medication Sig Dispense Refill  . ALPRAZolam (XANAX) 0.25 MG tablet TAKE 1 TABLET BY MOUTH AT BEDTIME AS NEEDED FOR SLEEP  90 tablet  1  . benazepril-hydrochlorthiazide (LOTENSIN HCT) 5-6.25 MG per tablet Take 1 tablet by mouth daily.  90 tablet  3  . clindamycin (CLEOCIN) 150 MG capsule TAKE 4 CAPSULES BY MOUTH PRIOR TO DENTAL WORK  12 capsule  3  . doxazosin (CARDURA) 1 MG tablet TAKE ONE TABLET BY MOUTH DAILY AT BEDTIME  90 tablet  3  . doxycycline (VIBRA-TABS) 100 MG tablet TAKE 1 TABLET BY MOUTH TWICE DAILY  180 tablet  3  . finasteride (PROSCAR) 5 MG tablet Take 1 tablet (5 mg total) by mouth daily.  90 tablet  3  . metoprolol succinate (TOPROL-XL) 25 MG 24 hr tablet 1 every am and 2 tablets every evening  270 tablet  3  . pantoprazole (PROTONIX) 40 MG tablet Take 1 tablet (40 mg total) by mouth daily.  90 tablet  3  . warfarin (COUMADIN) 5 MG tablet TAKE 1 TABLET BY MOUTH EVERY DAY  90 tablet  3   No current facility-administered medications on file prior to visit.    Current Medications (verified) Current Outpatient Prescriptions  Medication Sig Dispense Refill  . ALPRAZolam (XANAX) 0.25 MG tablet TAKE 1 TABLET BY MOUTH AT BEDTIME AS NEEDED FOR SLEEP  90 tablet  1  . benazepril-hydrochlorthiazide (LOTENSIN HCT) 5-6.25 MG per tablet Take 1 tablet by  mouth daily.  90 tablet  3  . clindamycin (CLEOCIN) 150 MG capsule TAKE 4 CAPSULES BY MOUTH PRIOR TO DENTAL WORK  12 capsule  3  . doxazosin (CARDURA) 1 MG tablet TAKE ONE TABLET BY MOUTH DAILY AT BEDTIME  90 tablet  3  . doxycycline (VIBRA-TABS) 100 MG tablet TAKE 1 TABLET BY MOUTH TWICE DAILY  180 tablet  3  . finasteride (PROSCAR) 5 MG tablet Take 1 tablet (5 mg total) by mouth daily.  90 tablet  3  . metoprolol succinate (TOPROL-XL) 25 MG 24 hr tablet 1 every am and 2 tablets every evening  270 tablet  3  . pantoprazole (PROTONIX) 40 MG tablet Take 1 tablet (40 mg total) by mouth daily.  90 tablet  3  . warfarin (COUMADIN) 5 MG tablet TAKE 1 TABLET BY MOUTH EVERY DAY  90 tablet  3   No current facility-administered medications for this visit.     Allergies (verified) Novocain and Penicillins   PAST HISTORY  Family History Family History  Problem Relation Age of Onset  . Mitral valve prolapse Mother   . Coronary artery disease Father     Social History History  Substance Use Topics  . Smoking status: Former Games developer  . Smokeless tobacco: Not on file     Comment: quit 50 yr ago  . Alcohol Use: No    Are there smokers in your home (other than you)?  No  Risk Factors Current exercise habits: The patient does not participate in regular exercise at present.  Dietary issues discussed: weigth gain   Cardiac risk factors: advanced age (older than 14 for men, 5 for women), dyslipidemia, hypertension and sedentary lifestyle.  Depression Screen (Note: if answer to either of the following is "Yes", a more complete depression screening is indicated)   Q1: Over the past two weeks, have you felt down, depressed or hopeless? No  Q2: Over the past two weeks, have you felt little interest or pleasure in doing things? No  Have you lost interest or pleasure in daily life? No  Do you often feel hopeless? No  Do you cry easily over simple problems? No  Activities of Daily Living In your  present state of health, do you have any difficulty performing the following activities?:  Driving? No Managing money?  No Feeding yourself? No Getting from bed to chair? No Climbing a flight of stairs? No Preparing food and eating?: No Bathing or showering? No Getting dressed: No Getting to the toilet? No Using the toilet:No Moving around from place to place: No In the past year have you fallen or had a near fall?:No   Are you sexually active?  Yes  Do you have more than one partner?  No  Hearing Difficulties: No Do you often ask people to speak up or repeat themselves? No Do you experience ringing or noises in your ears? No Do you have difficulty understanding soft or whispered voices? No   Do you feel that you  have a problem with memory? No  Do you often misplace items? No  Do you feel safe at home?  No  Cognitive Testing  Alert? Yes  Normal Appearance?Yes  Oriented to person? Yes  Place? Yes   Time? Yes  Recall of three objects?  Yes  Can perform simple calculations? Yes  Displays appropriate judgment?Yes  Can read the correct time from a watch face?Yes   Advanced Directives have been discussed with the patient? Yes   List the Names of Other Physician/Practitioners you currently use: 1.    Indicate any recent Medical Services you may have received from other than [facility] providers in the past year (date may be approximate).  Immunization History  Administered Date(s) Administered  . H1N1 09/01/2008  . Influenza Split 05/15/2011  . Influenza Whole 04/26/2009, 04/25/2013  . Pneumococcal Conjugate-13 07/21/2013  . Pneumococcal Polysaccharide-23 09/04/2007  . Td 09/04/2007, 03/12/2008    Screening Tests Health Maintenance  Topic Date Due  . Colonoscopy  01/06/1985  . Zostavax  01/07/1995  . Influenza Vaccine  03/21/2014  . Tetanus/tdap  03/12/2018  . Pneumococcal Polysaccharide Vaccine Age 60 And Over  Completed    All answers were reviewed with the patient  and necessary referrals were made:  Carrie Mew, MD   07/21/2013   History reviewed: allergies, current medications, past family history, past medical history, past social history, past surgical history and problem list  Review of Systems Pertinent items are noted in HPI.    Objective:     Vision by Snellen chart: right eye:20/20, left eye:20/20 Blood pressure 140/80, pulse 68, temperature 97.8 F (36.6 C), temperature source Oral, height 5' 4.75" (1.645 m), weight 164 lb (74.39 kg). Body mass index is 27.49 kg/(m^2).  General appearance: alert, cooperative and appears stated age Head: Normocephalic, without obvious abnormality, atraumatic, scalp lesions Eyes: conjunctivae/corneas clear. PERRL, EOM's intact. Fundi benign. Ears: normal TM's and external ear canals both ears Nose: Nares normal. Septum midline. Mucosa normal. No drainage or sinus tenderness. Throat: lips, mucosa, and tongue normal; teeth and gums normal Neck: no adenopathy, no carotid bruit, no JVD, supple, symmetrical, trachea midline and thyroid not enlarged, symmetric, no tenderness/mass/nodules Back: kyphosis and lordosis Lungs: normal percussion bilaterally Heart: irregularly irregular rhythm Abdomen: soft, non-tender; bowel sounds normal; no masses,  no organomegaly Extremities: varicose veins noted Pulses: 2+ and symmetric     Assessment:      Patient presents for yearly preventative medicine examination.   all immunizations and health maintenance protocols were reviewed with the patient and they are up to date with these protocols.   screening laboratory values were reviewed with the patient including screening of hyperlipidemia PSA renal function and hepatic function.   There medications past medical history social history problem list and allergies were reviewed in detail.   Goals were established with regard to weight loss exercise diet in compliance with medications      Plan:       During the course of the visit the patient was educated and counseled about appropriate screening and preventive services including:    Pneumococcal vaccine   Influenza vaccine  Diet review for nutrition referral? Yes ____  Not Indicated ____   Patient Instructions (the written plan) was given to the patient.  Medicare Attestation I have personally reviewed: The patient's medical and social history Their use of alcohol, tobacco or illicit drugs Their current medications and supplements The patient's functional ability including ADLs,fall risks, home safety risks, cognitive, and hearing and  visual impairment Diet and physical activities Evidence for depression or mood disorders  The patient's weight, height, BMI, and visual acuity have been recorded in the chart.  I have made referrals, counseling, and provided education to the patient based on review of the above and I have provided the patient with a written personalized care plan for preventive services.     Carrie Mew, MD   07/21/2013

## 2013-07-28 ENCOUNTER — Telehealth: Payer: Self-pay | Admitting: Internal Medicine

## 2013-07-28 NOTE — Telephone Encounter (Signed)
Pt would like you tp call w/ lab results and also send him a copy in the mail.

## 2013-07-28 NOTE — Telephone Encounter (Signed)
done

## 2013-08-20 ENCOUNTER — Other Ambulatory Visit: Payer: Self-pay | Admitting: Internal Medicine

## 2013-09-06 DIAGNOSIS — I4891 Unspecified atrial fibrillation: Secondary | ICD-10-CM | POA: Diagnosis not present

## 2013-09-15 DIAGNOSIS — IMO0002 Reserved for concepts with insufficient information to code with codable children: Secondary | ICD-10-CM | POA: Diagnosis not present

## 2013-09-15 DIAGNOSIS — M25569 Pain in unspecified knee: Secondary | ICD-10-CM | POA: Diagnosis not present

## 2013-09-15 DIAGNOSIS — M1710 Unilateral primary osteoarthritis, unspecified knee: Secondary | ICD-10-CM | POA: Diagnosis not present

## 2013-10-16 ENCOUNTER — Other Ambulatory Visit: Payer: Self-pay | Admitting: Internal Medicine

## 2013-10-17 ENCOUNTER — Other Ambulatory Visit: Payer: Self-pay | Admitting: Internal Medicine

## 2013-10-24 DIAGNOSIS — I4891 Unspecified atrial fibrillation: Secondary | ICD-10-CM | POA: Diagnosis not present

## 2013-11-05 DIAGNOSIS — IMO0002 Reserved for concepts with insufficient information to code with codable children: Secondary | ICD-10-CM | POA: Diagnosis not present

## 2013-11-05 DIAGNOSIS — M7120 Synovial cyst of popliteal space [Baker], unspecified knee: Secondary | ICD-10-CM | POA: Diagnosis not present

## 2013-11-05 DIAGNOSIS — M1710 Unilateral primary osteoarthritis, unspecified knee: Secondary | ICD-10-CM | POA: Diagnosis not present

## 2013-11-12 DIAGNOSIS — IMO0002 Reserved for concepts with insufficient information to code with codable children: Secondary | ICD-10-CM | POA: Diagnosis not present

## 2013-11-12 DIAGNOSIS — M1710 Unilateral primary osteoarthritis, unspecified knee: Secondary | ICD-10-CM | POA: Diagnosis not present

## 2013-11-16 ENCOUNTER — Other Ambulatory Visit: Payer: Self-pay | Admitting: Internal Medicine

## 2013-11-20 DIAGNOSIS — IMO0002 Reserved for concepts with insufficient information to code with codable children: Secondary | ICD-10-CM | POA: Diagnosis not present

## 2013-11-20 DIAGNOSIS — M1710 Unilateral primary osteoarthritis, unspecified knee: Secondary | ICD-10-CM | POA: Diagnosis not present

## 2013-12-04 DIAGNOSIS — M545 Low back pain, unspecified: Secondary | ICD-10-CM | POA: Diagnosis not present

## 2013-12-04 DIAGNOSIS — M25559 Pain in unspecified hip: Secondary | ICD-10-CM | POA: Diagnosis not present

## 2013-12-16 ENCOUNTER — Telehealth: Payer: Self-pay | Admitting: Internal Medicine

## 2013-12-16 NOTE — Telephone Encounter (Signed)
ok 

## 2013-12-16 NOTE — Telephone Encounter (Signed)
Pt would like to switch to dr Burnice Logan after last appt with dr Arnoldo Morale on 01-26-14, Can I sch?

## 2013-12-16 NOTE — Telephone Encounter (Signed)
Pt is aware.  

## 2014-01-19 DIAGNOSIS — I4891 Unspecified atrial fibrillation: Secondary | ICD-10-CM | POA: Diagnosis not present

## 2014-01-26 ENCOUNTER — Encounter: Payer: Self-pay | Admitting: Internal Medicine

## 2014-01-26 ENCOUNTER — Ambulatory Visit (INDEPENDENT_AMBULATORY_CARE_PROVIDER_SITE_OTHER): Payer: Medicare Other | Admitting: Internal Medicine

## 2014-01-26 VITALS — BP 142/72 | HR 72 | Temp 98.4°F | Ht 64.75 in | Wt 157.0 lb

## 2014-01-26 DIAGNOSIS — I4892 Unspecified atrial flutter: Secondary | ICD-10-CM | POA: Diagnosis not present

## 2014-01-26 DIAGNOSIS — I10 Essential (primary) hypertension: Secondary | ICD-10-CM | POA: Diagnosis not present

## 2014-01-26 DIAGNOSIS — N400 Enlarged prostate without lower urinary tract symptoms: Secondary | ICD-10-CM

## 2014-01-26 DIAGNOSIS — M1990 Unspecified osteoarthritis, unspecified site: Secondary | ICD-10-CM

## 2014-01-26 DIAGNOSIS — M4120 Other idiopathic scoliosis, site unspecified: Secondary | ICD-10-CM

## 2014-01-26 MED ORDER — SAW PALMETTO (SERENOA REPENS) 160 MG PO CAPS: 160.0000 mg | ORAL_CAPSULE | Freq: Two times a day (BID) | ORAL | Status: DC

## 2014-01-26 MED ORDER — ALPRAZOLAM 0.25 MG PO TABS: ORAL_TABLET | ORAL | Status: DC

## 2014-01-26 MED ORDER — DOXAZOSIN MESYLATE 1 MG PO TABS: ORAL_TABLET | ORAL | Status: DC

## 2014-01-26 MED ORDER — BENAZEPRIL-HYDROCHLOROTHIAZIDE 5-6.25 MG PO TABS: 1.0000 | ORAL_TABLET | Freq: Every day | ORAL | Status: DC

## 2014-01-26 MED ORDER — PANTOPRAZOLE SODIUM 40 MG PO TBEC: DELAYED_RELEASE_TABLET | ORAL | Status: DC

## 2014-01-26 MED ORDER — METOPROLOL SUCCINATE ER 25 MG PO TB24: ORAL_TABLET | ORAL | Status: DC

## 2014-01-26 MED ORDER — FINASTERIDE 5 MG PO TABS: ORAL_TABLET | ORAL | Status: DC

## 2014-01-26 NOTE — Progress Notes (Signed)
   Subjective:    Patient ID: Brian [name], male    DOB: 10/17/1932, 80 y.o.   MRN: [id number]  HPI Follow up for HTN and AF    Review of Systems  Constitutional: Negative for fever and fatigue.  HENT: Positive for hearing loss. Negative for congestion and postnasal drip.   Eyes: Negative for discharge, redness and visual disturbance.  Respiratory: Negative for cough, shortness of breath and wheezing.   Cardiovascular: Positive for leg swelling.  Gastrointestinal: Negative for abdominal pain, constipation and abdominal distention.  Genitourinary: Negative for urgency and frequency.  Musculoskeletal: Positive for gait problem and joint swelling. Negative for arthralgias and neck pain.  Skin: Negative for color change and rash.  Neurological: Negative for weakness and light-headedness.  Hematological: Negative for adenopathy.  Psychiatric/Behavioral: Negative for behavioral problems.    The patient is instructed to continue all medications as prescribed. Schedule followup with check out clerk upon leaving the clinic     Objective:   Physical Exam  Constitutional: He appears well-developed and well-nourished.  HENT:  Head: Normocephalic and atraumatic.  Eyes: Conjunctivae are normal. Pupils are equal, round, and reactive to light.  Neck: Normal range of motion. Neck supple.  Cardiovascular: Normal rate and regular rhythm.   Murmur heard. Pulmonary/Chest: Effort normal and breath sounds normal.  Abdominal: Soft. Bowel sounds are normal.  Musculoskeletal: He exhibits edema and tenderness.  Spinal stenosis and severe DJD of kneew Scoliosis noted  Skin: Skin is warm and dry.          Assessment & Plan:  Spinal stenosis with scoliosis  Mild anxiety  Consider rehab for exercise.  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

## 2014-01-26 NOTE — Patient Instructions (Addendum)
The patient is instructed to continue all medications as prescribed. Schedule followup with check out clerk upon leaving the clinic  Work with a trainer for exercise program

## 2014-01-26 NOTE — Progress Notes (Signed)
Pre visit review using our clinic review tool, if applicable. No additional management support is needed unless otherwise documented below in the visit note. 

## 2014-01-27 ENCOUNTER — Telehealth: Payer: Self-pay | Admitting: Internal Medicine

## 2014-01-27 NOTE — Telephone Encounter (Signed)
Relevant patient education assigned to patient using Emmi. ° °

## 2014-02-09 ENCOUNTER — Other Ambulatory Visit: Payer: Self-pay | Admitting: Internal Medicine

## 2014-02-12 ENCOUNTER — Telehealth: Payer: Self-pay | Admitting: Internal Medicine

## 2014-02-12 NOTE — Telephone Encounter (Signed)
Pt req a rx on doxycycline (VIBRA-TABS) 100 MG tablet   Pharmacy [facility] [place] [phone]

## 2014-02-13 MED ORDER — DOXYCYCLINE HYCLATE 100 MG PO TABS: ORAL_TABLET | ORAL | Status: DC

## 2014-02-13 NOTE — Telephone Encounter (Signed)
rx sent in electronically 

## 2014-02-13 NOTE — Telephone Encounter (Signed)
Refill please.

## 2014-03-11 ENCOUNTER — Ambulatory Visit: Payer: Medicare Other | Admitting: Internal Medicine

## 2014-03-17 DIAGNOSIS — I4891 Unspecified atrial fibrillation: Secondary | ICD-10-CM | POA: Diagnosis not present

## 2014-04-09 ENCOUNTER — Other Ambulatory Visit: Payer: Self-pay | Admitting: Internal Medicine

## 2014-04-16 ENCOUNTER — Encounter: Payer: Self-pay | Admitting: Internal Medicine

## 2014-04-16 ENCOUNTER — Ambulatory Visit (INDEPENDENT_AMBULATORY_CARE_PROVIDER_SITE_OTHER): Payer: Medicare Other | Admitting: Internal Medicine

## 2014-04-16 VITALS — BP 125/77 | HR 70 | Temp 98.1°F | Resp 20 | Ht 64.0 in | Wt 159.0 lb

## 2014-04-16 DIAGNOSIS — M1990 Unspecified osteoarthritis, unspecified site: Secondary | ICD-10-CM

## 2014-04-16 DIAGNOSIS — I2510 Atherosclerotic heart disease of native coronary artery without angina pectoris: Secondary | ICD-10-CM | POA: Diagnosis not present

## 2014-04-16 DIAGNOSIS — I10 Essential (primary) hypertension: Secondary | ICD-10-CM

## 2014-04-16 DIAGNOSIS — Z9889 Other specified postprocedural states: Secondary | ICD-10-CM

## 2014-04-16 DIAGNOSIS — M4120 Other idiopathic scoliosis, site unspecified: Secondary | ICD-10-CM

## 2014-04-16 DIAGNOSIS — I059 Rheumatic mitral valve disease, unspecified: Secondary | ICD-10-CM

## 2014-04-16 DIAGNOSIS — I4892 Unspecified atrial flutter: Secondary | ICD-10-CM

## 2014-04-16 DIAGNOSIS — Z86718 Personal history of other venous thrombosis and embolism: Secondary | ICD-10-CM | POA: Diagnosis not present

## 2014-04-16 MED ORDER — ALPRAZOLAM 0.25 MG PO TABS: ORAL_TABLET | ORAL | Status: DC

## 2014-04-16 NOTE — Patient Instructions (Signed)
Limit your sodium (Salt) intake    It is important that you exercise regularly, at least 20 minutes 3 to 4 times per week.  If you develop chest pain or shortness of breath seek  medical attention.  Return as scheduled for your annual exam  Followup cardiology

## 2014-04-16 NOTE — Progress Notes (Signed)
Subjective:    Patient ID: Brian [name], male    DOB: 1933-02-19, 80 y.o.   MRN: [id number]  HPI 80 year old patient who is in today to establish with my practice.  He is followed by cardiology following mitral valve repair in 1996.  He states that he has been on chronic Coumadin anticoagulation since that time.  He has a history of atrial flutter ablation as well as prior history of DVT.  He now monitors, Coumadin anticoagulation at home. He states that the cardiac surgery was complicated by a pleural effusion that required treat with steroids.  This did not require surgery or pericardiocentesis.  He states that he was also treated with doxycycline at that time, which he has continued.  He also feels this helps with his chronic sinus polyposis.  He does have a history of rosacea He is scheduled for a physical in December of this year. He has additionally, hypertension, coronary artery disease, and history of BPH.  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 Osteoarthrosis, unspecified whether generalized or localized, unspecified site   . BPH (benign prostatic hypertrophy)   . Anemia   . Hemorrhage of gastrointestinal tract, unspecified   . Epistaxis   . Atrial flutter   . Hypertension   . TIA (transient ischemic attack)   . Personal history of venous thrombosis and embolism   . Rosacea     History   Social History  . Marital Status: Married    Spouse Name: N/A    Number of Children: N/A  . Years of Education: N/A   Occupational History  . Not on file.   Social History Main Topics  . Smoking status: Former Research scientist (life sciences)  . Smokeless tobacco: Not on file     Comment: quit 50 yr ago  . Alcohol Use: No  . Drug Use: No  . Sexual Activity: Not on file   Other Topics Concern  . Not on file   Social History Narrative  . No narrative on file     Past Surgical History  Procedure Laterality Date  . Appendectomy    . Hernia repair    . Total hip arthroplasty    . Joint replacement    . Mvp repair    . Mitral valve repair      Family History  Problem Relation Age of Onset  . Mitral valve prolapse Mother   . Coronary artery disease Father     Allergies  Allergen Reactions  . Novocain [Procaine Hcl]     Irregular heart beat     . Penicillins     REACTION: itching    Current Outpatient Prescriptions on File Prior to Visit  Medication Sig Dispense Refill  . benazepril-hydrochlorthiazide (LOTENSIN HCT) 5-6.25 MG per tablet Take 1 tablet by mouth daily.  90 tablet  3  . clindamycin (CLEOCIN) 150 MG capsule TAKE 4 CAPSULES BY MOUTH PRIOR TO DENTAL WORK  12 capsule  1  . doxazosin (CARDURA) 1 MG tablet TAKE 1 TABLET BY MOUTH AT BEDTIME  90 tablet  3  . doxycycline (VIBRA-TABS) 100 MG tablet TAKE 1 TABLET BY MOUTH TWICE DAILY  180 tablet  0  . finasteride (PROSCAR) 5 MG tablet TAKE 1 TABLET BY MOUTH DAILY  90 tablet  2  . metoprolol succinate (TOPROL-XL) 25 MG 24 hr tablet TAKE 1 TABLET BY  MOUTH EVERY MORNING AND 2 TABLETS BY MOUTH EVERY EVENING  270 tablet  2  . pantoprazole (PROTONIX) 40 MG tablet TAKE 1 TABLET BY MOUTH DAILY  90 tablet  3  . saw palmetto 160 MG capsule Take 1 capsule (160 mg total) by mouth 2 (two) times daily.      Marland Kitchen warfarin (COUMADIN) 5 MG tablet TAKE 1 TABLET BY MOUTH EVERY DAY  90 tablet  3   No current facility-administered medications on file prior to visit.    BP 125/77  Pulse 70  Temp(Src) 98.1 F (36.7 C) (Oral)  Resp 20  Ht 5\' 4"  (1.626 m)  Wt 159 lb (72.122 kg)  BMI 27.28 kg/m2  SpO2 97%      Review of Systems  Constitutional: Negative for fever, chills, appetite change and fatigue.  HENT: Negative for congestion, dental problem, ear pain, hearing loss, sore throat, tinnitus, trouble swallowing and voice change.   Eyes: Negative for pain, discharge and visual disturbance.    Respiratory: Negative for cough, chest tightness, wheezing and stridor.   Cardiovascular: Negative for chest pain, palpitations and leg swelling.  Gastrointestinal: Negative for nausea, vomiting, abdominal pain, diarrhea, constipation, blood in stool and abdominal distention.  Genitourinary: Negative for urgency, hematuria, flank pain, discharge, difficulty urinating and genital sores.  Musculoskeletal: Negative for arthralgias, back pain, gait problem, joint swelling, myalgias and neck stiffness.  Skin: Negative for rash.  Neurological: Negative for dizziness, syncope, speech difficulty, weakness, numbness and headaches.  Hematological: Negative for adenopathy. Does not bruise/bleed easily.  Psychiatric/Behavioral: Negative for behavioral problems and dysphoric mood. The patient is not nervous/anxious.        Objective:   Physical Exam  Constitutional: He is oriented to person, place, and time. He appears well-developed.  Blood pressure is low normal  HENT:  Head: Normocephalic.  Right Ear: External ear normal.  Left Ear: External ear normal.  Eyes: Conjunctivae and EOM are normal.  Neck: Normal range of motion.  Cardiovascular: Normal rate and normal heart sounds.   Irregular with a controlled ventricular response  Pulmonary/Chest: Breath sounds normal.  Abdominal: Bowel sounds are normal.  Musculoskeletal: Normal range of motion. He exhibits no edema and no tenderness.  Neurological: He is alert and oriented to person, place, and time.  Psychiatric: He has a normal mood and affect. His behavior is normal.          Assessment & Plan:   Atrial fibrillation.  We'll continue present regimen and chronic Coumadin anticoagulation Status post mitral valve repair Hypertension well controlled Coronary artery disease.  Follow cardiology  CPX here as scheduled in 4 months

## 2014-04-16 NOTE — Progress Notes (Signed)
Pre visit review using our clinic review tool, if applicable. No additional management support is needed unless otherwise documented below in the visit note. 

## 2014-04-24 ENCOUNTER — Telehealth: Payer: Self-pay | Admitting: *Deleted

## 2014-04-24 NOTE — Telephone Encounter (Signed)
Received a letter from the patient wanting to know about taking doxycycline twice daily. Spoke with pt, Dr Stanford Breed has reviewed the letter and he recommends antibiotics for SBE only. Pt requested I forward this note to dr Salvadore Farber for his knowledge

## 2014-05-08 ENCOUNTER — Other Ambulatory Visit: Payer: Self-pay | Admitting: *Deleted

## 2014-05-08 MED ORDER — DOXYCYCLINE HYCLATE 100 MG PO TABS: ORAL_TABLET | ORAL | Status: DC

## 2014-05-08 NOTE — Telephone Encounter (Signed)
Pt notified Rx sent to pharmacy

## 2014-05-14 DIAGNOSIS — I4891 Unspecified atrial fibrillation: Secondary | ICD-10-CM | POA: Diagnosis not present

## 2014-06-30 DIAGNOSIS — I4891 Unspecified atrial fibrillation: Secondary | ICD-10-CM | POA: Diagnosis not present

## 2014-07-21 ENCOUNTER — Encounter: Payer: Medicare Other | Admitting: Internal Medicine

## 2014-08-04 ENCOUNTER — Ambulatory Visit: Payer: Medicare Other | Admitting: Internal Medicine

## 2014-08-04 ENCOUNTER — Encounter: Payer: Medicare Other | Admitting: Internal Medicine

## 2014-08-10 ENCOUNTER — Encounter: Payer: Self-pay | Admitting: *Deleted

## 2014-08-10 ENCOUNTER — Encounter: Payer: Self-pay | Admitting: Internal Medicine

## 2014-08-10 ENCOUNTER — Ambulatory Visit (INDEPENDENT_AMBULATORY_CARE_PROVIDER_SITE_OTHER): Payer: Medicare Other | Admitting: Internal Medicine

## 2014-08-10 VITALS — BP 120/80 | HR 96 | Resp 20 | Ht 64.0 in | Wt 158.0 lb

## 2014-08-10 DIAGNOSIS — Z0000 Encounter for general adult medical examination without abnormal findings: Secondary | ICD-10-CM | POA: Diagnosis not present

## 2014-08-10 DIAGNOSIS — I10 Essential (primary) hypertension: Secondary | ICD-10-CM | POA: Diagnosis not present

## 2014-08-10 DIAGNOSIS — Z9889 Other specified postprocedural states: Secondary | ICD-10-CM

## 2014-08-10 DIAGNOSIS — I2510 Atherosclerotic heart disease of native coronary artery without angina pectoris: Secondary | ICD-10-CM

## 2014-08-10 DIAGNOSIS — K644 Residual hemorrhoidal skin tags: Secondary | ICD-10-CM

## 2014-08-10 DIAGNOSIS — K648 Other hemorrhoids: Secondary | ICD-10-CM | POA: Diagnosis not present

## 2014-08-10 DIAGNOSIS — I4892 Unspecified atrial flutter: Secondary | ICD-10-CM | POA: Diagnosis not present

## 2014-08-10 LAB — CBC WITH DIFFERENTIAL/PLATELET
BASOS ABS: 0 10*3/uL (ref 0.0–0.1)
Basophils Relative: 0.6 % (ref 0.0–3.0)
EOS ABS: 0.1 10*3/uL (ref 0.0–0.7)
Eosinophils Relative: 1.2 % (ref 0.0–5.0)
HEMATOCRIT: 43 % (ref 39.0–52.0)
Hemoglobin: 14.1 g/dL (ref 13.0–17.0)
LYMPHS ABS: 1.1 10*3/uL (ref 0.7–4.0)
Lymphocytes Relative: 16.9 % (ref 12.0–46.0)
MCHC: 32.8 g/dL (ref 30.0–36.0)
MCV: 89.3 fl (ref 78.0–100.0)
MONO ABS: 0.6 10*3/uL (ref 0.1–1.0)
Monocytes Relative: 9.2 % (ref 3.0–12.0)
Neutro Abs: 4.7 10*3/uL (ref 1.4–7.7)
Neutrophils Relative %: 72.1 % (ref 43.0–77.0)
Platelets: 302 10*3/uL (ref 150.0–400.0)
RBC: 4.81 Mil/uL (ref 4.22–5.81)
RDW: 15.1 % (ref 11.5–15.5)
WBC: 6.5 10*3/uL (ref 4.0–10.5)

## 2014-08-10 LAB — LIPID PANEL
CHOL/HDL RATIO: 3
Cholesterol: 136 mg/dL (ref 0–200)
HDL: 43.8 mg/dL (ref >39.00)
LDL CALC: 80 mg/dL (ref 0–99)
NONHDL: 92.2
TRIGLYCERIDES: 62 mg/dL (ref 0.0–149.0)
VLDL: 12.4 mg/dL (ref 0.0–40.0)

## 2014-08-10 LAB — COMPREHENSIVE METABOLIC PANEL
ALT: 23 U/L (ref 0–53)
AST: 27 U/L (ref 0–37)
Albumin: 3.9 g/dL (ref 3.5–5.2)
Alkaline Phosphatase: 105 U/L (ref 39–117)
BUN: 10 mg/dL (ref 6–23)
CHLORIDE: 99 meq/L (ref 96–112)
CO2: 27 mEq/L (ref 19–32)
CREATININE: 0.7 mg/dL (ref 0.4–1.5)
Calcium: 8.8 mg/dL (ref 8.4–10.5)
GFR: 119.38 mL/min (ref >60.00)
Glucose, Bld: 88 mg/dL (ref 70–99)
POTASSIUM: 3.7 meq/L (ref 3.5–5.1)
Sodium: 133 mEq/L — ABNORMAL LOW (ref 135–145)
Total Bilirubin: 0.8 mg/dL (ref 0.2–1.2)
Total Protein: 6.6 g/dL (ref 6.0–8.3)

## 2014-08-10 MED ORDER — ALPRAZOLAM 0.25 MG PO TABS: ORAL_TABLET | ORAL | Status: DC

## 2014-08-10 MED ORDER — BENAZEPRIL-HYDROCHLOROTHIAZIDE 5-6.25 MG PO TABS: 1.0000 | ORAL_TABLET | Freq: Every day | ORAL | Status: DC

## 2014-08-10 MED ORDER — FINASTERIDE 5 MG PO TABS: 5.0000 mg | ORAL_TABLET | Freq: Every day | ORAL | Status: DC

## 2014-08-10 MED ORDER — PANTOPRAZOLE SODIUM 40 MG PO TBEC: DELAYED_RELEASE_TABLET | ORAL | Status: DC

## 2014-08-10 MED ORDER — HYDROCORTISONE 2.5 % RE CREA: 1.0000 "application " | TOPICAL_CREAM | Freq: Two times a day (BID) | RECTAL | Status: DC

## 2014-08-10 MED ORDER — WARFARIN SODIUM 5 MG PO TABS: 5.0000 mg | ORAL_TABLET | Freq: Every day | ORAL | Status: DC

## 2014-08-10 MED ORDER — DOXYCYCLINE HYCLATE 100 MG PO TABS: ORAL_TABLET | ORAL | Status: DC

## 2014-08-10 MED ORDER — METOPROLOL SUCCINATE ER 25 MG PO TB24: ORAL_TABLET | ORAL | Status: DC

## 2014-08-10 MED ORDER — DOXAZOSIN MESYLATE 1 MG PO TABS: ORAL_TABLET | ORAL | Status: DC

## 2014-08-10 NOTE — Progress Notes (Signed)
Pre visit review using our clinic review tool, if applicable. No additional management support is needed unless otherwise documented below in the visit note. 

## 2014-08-10 NOTE — Patient Instructions (Addendum)
Limit your sodium (Salt) intake    It is important that you exercise regularly, at least 20 minutes 3 to 4 times per week.  If you develop chest pain or shortness of breath seek  medical attention.Health Maintenance A healthy lifestyle and preventative care can promote health and wellness.  Maintain regular health, dental, and eye exams.  Eat a healthy diet. Foods like vegetables, fruits, whole grains, low-fat dairy products, and lean protein foods contain the nutrients you need and are low in calories. Decrease your intake of foods high in solid fats, added sugars, and salt. Get information about a proper diet from your health care provider, if necessary.  Regular physical exercise is one of the most important things you can do for your health. Most adults should get at least 150 minutes of moderate-intensity exercise (any activity that increases your heart rate and causes you to sweat) each week. In addition, most adults need muscle-strengthening exercises on 2 or more days a week.   Maintain a healthy weight. The body mass index (BMI) is a screening tool to identify possible weight problems. It provides an estimate of body fat based on height and weight. Your health care provider can find your BMI and can help you achieve or maintain a healthy weight. For males 20 years and older:  A BMI below 18.5 is considered underweight.  A BMI of 18.5 to 24.9 is normal.  A BMI of 25 to 29.9 is considered overweight.  A BMI of 30 and above is considered obese.  Maintain normal blood lipids and cholesterol by exercising and minimizing your intake of saturated fat. Eat a balanced diet with plenty of fruits and vegetables. Blood tests for lipids and cholesterol should begin at age 20 and be repeated every 5 years. If your lipid or cholesterol levels are high, you are over age 50, or you are at high risk for heart disease, you may need your cholesterol levels checked more frequently.Ongoing high lipid and  cholesterol levels should be treated with medicines if diet and exercise are not working.  If you smoke, find out from your health care provider how to quit. If you do not use tobacco, do not start.  Lung cancer screening is recommended for adults aged 55-80 years who are at high risk for developing lung cancer because of a history of smoking. A yearly low-dose CT scan of the lungs is recommended for people who have at least a 30-pack-year history of smoking and are current smokers or have quit within the past 15 years. A pack year of smoking is smoking an average of 1 pack of cigarettes a day for 1 year (for example, a 30-pack-year history of smoking could mean smoking 1 pack a day for 30 years or 2 packs a day for 15 years). Yearly screening should continue until the smoker has stopped smoking for at least 15 years. Yearly screening should be stopped for people who develop a health problem that would prevent them from having lung cancer treatment.  If you choose to drink alcohol, do not have more than 2 drinks per day. One drink is considered to be 12 oz (360 mL) of beer, 5 oz (150 mL) of wine, or 1.5 oz (45 mL) of liquor.  Avoid the use of street drugs. Do not share needles with anyone. Ask for help if you need support or instructions about stopping the use of drugs.  High blood pressure causes heart disease and increases the risk of stroke. Blood pressure   should be checked at least every 1-2 years. Ongoing high blood pressure should be treated with medicines if weight loss and exercise are not effective.  If you are 45-79 years old, ask your health care provider if you should take aspirin to prevent heart disease.  Diabetes screening involves taking a blood sample to check your fasting blood sugar level. This should be done once every 3 years after age 45 if you are at a normal weight and without risk factors for diabetes. Testing should be considered at a younger age or be carried out more  frequently if you are overweight and have at least 1 risk factor for diabetes.  Colorectal cancer can be detected and often prevented. Most routine colorectal cancer screening begins at the age of 50 and continues through age 75. However, your health care provider may recommend screening at an earlier age if you have risk factors for colon cancer. On a yearly basis, your health care provider may provide home test kits to check for hidden blood in the stool. A small camera at the end of a tube may be used to directly examine the colon (sigmoidoscopy or colonoscopy) to detect the earliest forms of colorectal cancer. Talk to your health care provider about this at age 50 when routine screening begins. A direct exam of the colon should be repeated every 5-10 years through age 75, unless early forms of precancerous polyps or small growths are found.  People who are at an increased risk for hepatitis B should be screened for this virus. You are considered at high risk for hepatitis B if:  You were born in a country where hepatitis B occurs often. Talk with your health care provider about which countries are considered high risk.  Your parents were born in a high-risk country and you have not received a shot to protect against hepatitis B (hepatitis B vaccine).  You have HIV or AIDS.  You use needles to inject street drugs.  You live with, or have sex with, someone who has hepatitis B.  You are a man who has sex with other men (MSM).  You get hemodialysis treatment.  You take certain medicines for conditions like cancer, organ transplantation, and autoimmune conditions.  Hepatitis C blood testing is recommended for all people born from 1945 through 1965 and any individual with known risk factors for hepatitis C.  Healthy men should no longer receive prostate-specific antigen (PSA) blood tests as part of routine cancer screening. Talk to your health care provider about prostate cancer  screening.  Testicular cancer screening is not recommended for adolescents or adult males who have no symptoms. Screening includes self-exam, a health care provider exam, and other screening tests. Consult with your health care provider about any symptoms you have or any concerns you have about testicular cancer.  Practice safe sex. Use condoms and avoid high-risk sexual practices to reduce the spread of sexually transmitted infections (STIs).  You should be screened for STIs, including gonorrhea and chlamydia if:  You are sexually active and are younger than 24 years.  You are older than 24 years, and your health care provider tells you that you are at risk for this type of infection.  Your sexual activity has changed since you were last screened, and you are at an increased risk for chlamydia or gonorrhea. Ask your health care provider if you are at risk.  If you are at risk of being infected with HIV, it is recommended that you take a   prescription medicine daily to prevent HIV infection. This is called pre-exposure prophylaxis (PrEP). You are considered at risk if:  You are a man who has sex with other men (MSM).  You are a heterosexual man who is sexually active with multiple partners.  You take drugs by injection.  You are sexually active with a partner who has HIV.  Talk with your health care provider about whether you are at high risk of being infected with HIV. If you choose to begin PrEP, you should first be tested for HIV. You should then be tested every 3 months for as long as you are taking PrEP.  Use sunscreen. Apply sunscreen liberally and repeatedly throughout the day. You should seek shade when your shadow is shorter than you. Protect yourself by wearing long sleeves, pants, a wide-brimmed hat, and sunglasses year round whenever you are outdoors.  Tell your health care provider of new moles or changes in moles, especially if there is a change in shape or color. Also, tell  your health care provider if a mole is larger than the size of a pencil eraser.  A one-time screening for abdominal aortic aneurysm (AAA) and surgical repair of large AAAs by ultrasound is recommended for men aged 65-75 years who are current or former smokers.  Stay current with your vaccines (immunizations). Document Released: 02/03/2008 Document Revised: 08/12/2013 Document Reviewed: 01/02/2011 Exit[employer] Patient Information 2015 [employer]. This information is not intended to replace advice given to you by your health care provider. Make sure you discuss any questions you have with your health care provider.  

## 2014-08-10 NOTE — Progress Notes (Signed)
Subjective:    Patient ID: Brian [name], male    DOB: [birth year], 80 y.o.   MRN: [id number]  HPI  80 year old patient who is in today for an annual health examination.  He is followed by cardiology following mitral valve repair in 1996.  He states that he has been on chronic Coumadin anticoagulation since that time.  He has a history of atrial flutter ablation as well as prior history of DVT.  He now monitors, Coumadin anticoagulation at home. He states that the cardiac surgery was complicated by a pleural effusion that required treat with steroids.  This did not require surgery or pericardiocentesis.  He states that he was also treated with doxycycline at that time, which he has continued.  He also feels this helps with his chronic sinus polyposis.  He does have a history of rosacea Complaints today include a 3-4 month history of painful and bleeding hemorrhoids He has additionally, hypertension, coronary artery disease, and history of BPH.  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 Osteoarthrosis, unspecified whether generalized or localized, unspecified site   . BPH (benign prostatic hypertrophy)   . Anemia   . Hemorrhage of gastrointestinal tract, unspecified   . Epistaxis   . Atrial flutter   . Hypertension   . TIA (transient ischemic attack)   . Personal history of venous thrombosis and embolism   . Rosacea     History   Social History  . Marital Status: Married    Spouse Name: N/A    Number of Children: N/A  . Years of Education: N/A   Occupational History  . Not on file.   Social History Main Topics  . Smoking status: Former Research scientist (life sciences)  . Smokeless tobacco: Not on file     Comment: quit 50 yr ago  . Alcohol Use: No  . Drug Use: No  . Sexual Activity: Not on file   Other Topics Concern  . Not on file   Social History  Narrative    Past Surgical History  Procedure Laterality Date  . Appendectomy    . Hernia repair    . Total hip arthroplasty    . Joint replacement    . Mvp repair    . Mitral valve repair      Family History  Problem Relation Age of Onset  . Mitral valve prolapse Mother   . Coronary artery disease Father     Allergies  Allergen Reactions  . Novocain [Procaine Hcl]     Irregular heart beat     . Penicillins     REACTION: itching    Current Outpatient Prescriptions on File Prior to Visit  Medication Sig Dispense Refill  . ALPRAZolam (XANAX) 0.25 MG tablet TAKE 1 TABLET BY MOUTH AT BEDTIME AS NEEDED FOR SLEEP 90 tablet 1  . benazepril-hydrochlorthiazide (LOTENSIN HCT) 5-6.25 MG per tablet Take 1 tablet by mouth daily. 90 tablet 3  . clindamycin (CLEOCIN) 150 MG capsule TAKE 4 CAPSULES BY MOUTH PRIOR TO DENTAL WORK 12 capsule 1  . doxazosin (CARDURA) 1 MG tablet TAKE 1 TABLET BY MOUTH AT BEDTIME 90 tablet 3  . doxycycline (VIBRA-TABS) 100 MG tablet TAKE 1 TABLET BY MOUTH TWICE DAILY 180 tablet 0  . finasteride (PROSCAR) 5 MG tablet TAKE 1 TABLET BY MOUTH DAILY 90 tablet 2  . metoprolol succinate (TOPROL-XL) 25 MG  24 hr tablet TAKE 1 TABLET BY MOUTH EVERY MORNING AND 2 TABLETS BY MOUTH EVERY EVENING 270 tablet 2  . pantoprazole (PROTONIX) 40 MG tablet TAKE 1 TABLET BY MOUTH DAILY 90 tablet 3  . saw palmetto 160 MG capsule Take 1 capsule (160 mg total) by mouth 2 (two) times daily.    Marland Kitchen warfarin (COUMADIN) 5 MG tablet TAKE 1 TABLET BY MOUTH EVERY DAY 90 tablet 3   No current facility-administered medications on file prior to visit.    BP 120/80 mmHg  Pulse 96  Resp 20  Ht 5\' 4"  (1.626 m)  Wt 158 lb (71.668 kg)  BMI 27.11 kg/m2  SpO2 98%   1. Risk factors, based on past  M,S,F history  2.  Physical activities:  3.  Depression/mood:  4.  Hearing:  5.  ADL's:  6.  Fall risk:  7.  Home safety:  8.  Height weight, and visual acuity;  9.  Counseling:  10.  Lab orders based on risk factors:  11. Referral :  12. Care plan:  13. Cognitive assessment:   14. Screening:  15. Provider List Update:   1. Risk factors, based on past  M,S,F history.  Cardiovascular risk factors include hypertension.  He does have known coronary artery disease  2.  Physical activities: Remains quite active.  Still works 6 days weekly  3.  Depression/mood: No history depression or mood disorder  4.  Hearing: Moderate age-related impairment  5.  ADL's: Totally independent  6.  Fall risk: Low  7.  Home safety: No problems identified  8.  Height weight, and visual acuity; height and weight stable no change in visual acuity  9.  Counseling: Continue heart healthy diet and maintain ideal weight  10. Lab orders based on risk factors: Laboratory update including lipid panel will be reviewed  11. Referral : GI referral for symptomatic hemorrhoids.  Follow-up cardiology  12. Care plan: Continue home Coumadin monitoring and heart healthy diet  13. Cognitive assessment: Alert and oriented, normal affect.  No cognitive deficits  14. Screening: Continue annual health screening. Patient was provided with a written and personalized care plan  15. Provider List Update:  includes cardiology GI and primary care medicine      Review of Systems  Constitutional: Negative for fever, chills, appetite change and fatigue.  HENT: Negative for congestion, dental problem, ear pain, hearing loss, sore throat, tinnitus, trouble swallowing and voice change.   Eyes: Negative for pain, discharge and visual disturbance.  Respiratory: Negative for cough, chest tightness, wheezing and stridor.   Cardiovascular: Negative for chest pain, palpitations and leg swelling.  Gastrointestinal: Negative for nausea, vomiting, abdominal pain, diarrhea, constipation, blood in stool and abdominal distention.  Genitourinary: Negative for urgency, hematuria, flank pain, discharge, difficulty  urinating and genital sores.  Musculoskeletal: Negative for myalgias, back pain, joint swelling, arthralgias, gait problem and neck stiffness.  Skin: Negative for rash.  Neurological: Negative for dizziness, syncope, speech difficulty, weakness, numbness and headaches.  Hematological: Negative for adenopathy. Does not bruise/bleed easily.  Psychiatric/Behavioral: Negative for behavioral problems and dysphoric mood. The patient is not nervous/anxious.        Objective:   Physical Exam  Constitutional: He appears well-developed and well-nourished.  HENT:  Head: Normocephalic and atraumatic.  Right Ear: External ear normal.  Left Ear: External ear normal.  Nose: Nose normal.  Mouth/Throat: Oropharynx is clear and moist.  Eyes: Conjunctivae and EOM are normal. Pupils are equal, round, and reactive  to light. No scleral icterus.  Neck: Normal range of motion. Neck supple. No JVD present. No thyromegaly present.  Cardiovascular: Normal heart sounds and intact distal pulses.  Exam reveals no gallop and no friction rub.   No murmur heard. Irregular rhythm with controlled ventricular response  Pulmonary/Chest: Effort normal and breath sounds normal. He exhibits no tenderness.  Significant scoliosis  Abdominal: Soft. Bowel sounds are normal. He exhibits no distension and no mass. There is no tenderness.  Ventral hernia  Genitourinary: Prostate normal and penis normal.  External hemorrhoids Stool, hematest negative Prostate plus 3  Musculoskeletal: Normal range of motion. He exhibits no edema or tenderness.  Lymphadenopathy:    He has no cervical adenopathy.  Neurological: He is alert. He has normal reflexes. No cranial nerve deficit. Coordination normal.  Skin: Skin is warm and dry. No rash noted.  Psychiatric: He has a normal mood and affect. His behavior is normal.          Assessment & Plan:  Preventive health examination Atrial fibrillation.  We'll continue present regimen and  chronic Coumadin anticoagulation Status post mitral valve repair Hypertension well controlled Coronary artery disease.  Follow cardiology Symptomatic hemorrhoids.  Will schedule GI follow-up   CPX here as scheduled in 4 months

## 2014-08-18 ENCOUNTER — Telehealth: Payer: Self-pay

## 2014-08-18 ENCOUNTER — Telehealth: Payer: Self-pay | Admitting: *Deleted

## 2014-08-18 ENCOUNTER — Other Ambulatory Visit: Payer: Self-pay | Admitting: *Deleted

## 2014-08-18 NOTE — Telephone Encounter (Signed)
Opened in error

## 2014-08-18 NOTE — Telephone Encounter (Signed)
Noted  

## 2014-08-18 NOTE — Telephone Encounter (Signed)
Refill request for Warfarin 5mg  #90 with 3 refills sent to [facility] in [place]

## 2014-08-26 DIAGNOSIS — I4891 Unspecified atrial fibrillation: Secondary | ICD-10-CM | POA: Diagnosis not present

## 2014-09-04 ENCOUNTER — Encounter: Payer: Self-pay | Admitting: Internal Medicine

## 2014-10-08 DIAGNOSIS — I4891 Unspecified atrial fibrillation: Secondary | ICD-10-CM | POA: Diagnosis not present

## 2014-11-30 ENCOUNTER — Other Ambulatory Visit: Payer: Self-pay | Admitting: Internal Medicine

## 2014-12-14 DIAGNOSIS — I4891 Unspecified atrial fibrillation: Secondary | ICD-10-CM | POA: Diagnosis not present

## 2015-01-25 ENCOUNTER — Other Ambulatory Visit: Payer: Self-pay | Admitting: *Deleted

## 2015-01-25 MED ORDER — PANTOPRAZOLE SODIUM 40 MG PO TBEC: DELAYED_RELEASE_TABLET | ORAL | Status: DC

## 2015-02-02 ENCOUNTER — Other Ambulatory Visit: Payer: Self-pay | Admitting: *Deleted

## 2015-02-02 MED ORDER — DOXAZOSIN MESYLATE 1 MG PO TABS: ORAL_TABLET | ORAL | Status: DC

## 2015-02-08 ENCOUNTER — Encounter: Payer: Self-pay | Admitting: Internal Medicine

## 2015-02-08 ENCOUNTER — Ambulatory Visit (INDEPENDENT_AMBULATORY_CARE_PROVIDER_SITE_OTHER): Payer: Medicare Other | Admitting: Internal Medicine

## 2015-02-08 VITALS — BP 120/70 | HR 86 | Temp 98.1°F | Resp 20 | Ht 64.0 in | Wt 164.0 lb

## 2015-02-08 DIAGNOSIS — M8949 Other hypertrophic osteoarthropathy, multiple sites: Secondary | ICD-10-CM

## 2015-02-08 DIAGNOSIS — M159 Polyosteoarthritis, unspecified: Secondary | ICD-10-CM

## 2015-02-08 DIAGNOSIS — I2510 Atherosclerotic heart disease of native coronary artery without angina pectoris: Secondary | ICD-10-CM | POA: Diagnosis not present

## 2015-02-08 DIAGNOSIS — I10 Essential (primary) hypertension: Secondary | ICD-10-CM | POA: Diagnosis not present

## 2015-02-08 DIAGNOSIS — M150 Primary generalized (osteo)arthritis: Secondary | ICD-10-CM | POA: Diagnosis not present

## 2015-02-08 MED ORDER — ALPRAZOLAM 0.25 MG PO TABS: ORAL_TABLET | ORAL | Status: DC

## 2015-02-08 MED ORDER — CLINDAMYCIN HCL 150 MG PO CAPS: ORAL_CAPSULE | ORAL | Status: DC

## 2015-02-08 NOTE — Progress Notes (Signed)
Subjective:    Patient ID: Brian [name], male    DOB: September 15, 1932, 81 y.o.   MRN: [id number]  HPI  81 year old patient who is seen today for his biannual follow-up.  He has a history of essential hypertension and CAD.  He has remote history of peptic ulcer disease and has been on PPI therapy for years.  No GI symptoms.  His cardiac status has been stable.  No concerns or complaints.  Past Medical History  Diagnosis Date  . Osteoarthrosis, unspecified whether generalized or localized, unspecified site   . BPH (benign prostatic hypertrophy)   . Anemia   . Hemorrhage of gastrointestinal tract, unspecified   . Epistaxis   . Atrial flutter   . Hypertension   . TIA (transient ischemic attack)   . Personal history of venous thrombosis and embolism   . Rosacea     History   Social History  . Marital Status: Married    Spouse Name: N/A  . Number of Children: N/A  . Years of Education: N/A   Occupational History  . Not on file.   Social History Main Topics  . Smoking status: Former Research scientist (life sciences)  . Smokeless tobacco: Not on file     Comment: quit 50 yr ago  . Alcohol Use: No  . Drug Use: No  . Sexual Activity: Not on file   Other Topics Concern  . Not on file   Social History Narrative    Past Surgical History  Procedure Laterality Date  . Appendectomy    . Hernia repair    . Total hip arthroplasty    . Joint replacement    . Mvp repair    . Mitral valve repair      Family History  Problem Relation Age of Onset  . Mitral valve prolapse Mother   . Coronary artery disease Father     Allergies  Allergen Reactions  . Novocain [Procaine Hcl]     Irregular heart beat     . Penicillins     REACTION: itching    Current Outpatient Prescriptions on File Prior to Visit  Medication Sig Dispense Refill  . benazepril-hydrochlorthiazide (LOTENSIN HCT) 5-6.25 MG per tablet Take 1 tablet by mouth daily. 90 tablet 3  . doxazosin (CARDURA) 1 MG tablet TAKE 1 TABLET BY MOUTH  AT BEDTIME 90 tablet 1  . doxycycline (VIBRA-TABS) 100 MG tablet TAKE 1 TABLET BY MOUTH TWICE DAILY 180 tablet 3  . finasteride (PROSCAR) 5 MG tablet Take 1 tablet (5 mg total) by mouth daily. 90 tablet 3  . hydrocortisone (PROCTOSOL HC) 2.5 % rectal cream Place 1 application rectally 2 (two) times daily. 30 g 6  . metoprolol succinate (TOPROL-XL) 25 MG 24 hr tablet TAKE 1 TABLET BY MOUTH EVERY MORNING AND 2 TABLETS BY MOUTH EVERY EVENING 270 tablet 3  . pantoprazole (PROTONIX) 40 MG tablet TAKE 1 TABLET BY MOUTH DAILY 90 tablet 1  . saw palmetto 160 MG capsule Take 1 capsule (160 mg total) by mouth 2 (two) times daily.    Marland Kitchen warfarin (COUMADIN) 5 MG tablet Take 1 tablet (5 mg total) by mouth daily. 90 tablet 3   No current facility-administered medications on file prior to visit.    BP 120/70 mmHg  Pulse 86  Temp(Src) 98.1 F (36.7 C) (Oral)  Resp 20  Ht 5\' 4"  (1.626 m)  Wt 164 lb (74.39 kg)  BMI 28.14 kg/m2  SpO2 98%     Review of Systems  Constitutional: Negative for fever, chills, appetite change and fatigue.  HENT: Negative for congestion, dental problem, ear pain, hearing loss, sore throat, tinnitus, trouble swallowing and voice change.   Eyes: Negative for pain, discharge and visual disturbance.  Respiratory: Negative for cough, chest tightness, wheezing and stridor.   Cardiovascular: Negative for chest pain, palpitations and leg swelling.  Gastrointestinal: Negative for nausea, vomiting, abdominal pain, diarrhea, constipation, blood in stool and abdominal distention.  Genitourinary: Negative for urgency, hematuria, flank pain, discharge, difficulty urinating and genital sores.  Musculoskeletal: Negative for myalgias, back pain, joint swelling, arthralgias, gait problem and neck stiffness.  Skin: Negative for rash.  Neurological: Negative for dizziness, syncope, speech difficulty, weakness, numbness and headaches.  Hematological: Negative for adenopathy. Does not  bruise/bleed easily.  Psychiatric/Behavioral: Negative for behavioral problems and dysphoric mood. The patient is not nervous/anxious.        Objective:   Physical Exam  Constitutional: He is oriented to person, place, and time. He appears well-developed.  HENT:  Head: Normocephalic.  Right Ear: External ear normal.  Left Ear: External ear normal.  Eyes: Conjunctivae and EOM are normal.  Neck: Normal range of motion.  Cardiovascular: Normal rate and normal heart sounds.   Pulmonary/Chest: Breath sounds normal.  Abdominal: Bowel sounds are normal.  Musculoskeletal: Normal range of motion. He exhibits no edema or tenderness.  Neurological: He is alert and oriented to person, place, and time.  Psychiatric: He has a normal mood and affect. His behavior is normal.          Assessment & Plan:   Essential hypertension, well-controlled Coronary artery disease, stable History of atrial flutter.  Continue Coumadin anticoagulation Remote history of peptic ulcer disease.  Will discontinue PPI therapy and observe  CPX 6 months

## 2015-02-08 NOTE — Patient Instructions (Addendum)
Limit your sodium (Salt) intake    It is important that you exercise regularly, at least 20 minutes 3 to 4 times per week.  If you develop chest pain or shortness of breath seek  medical attention.  Return in 6 months for follow-up  Okay to discontinue Protonix

## 2015-02-08 NOTE — Progress Notes (Signed)
Pre visit review using our clinic review tool, if applicable. No additional management support is needed unless otherwise documented below in the visit note. 

## 2015-02-09 ENCOUNTER — Ambulatory Visit: Payer: Medicare Other | Admitting: Internal Medicine

## 2015-02-18 ENCOUNTER — Other Ambulatory Visit: Payer: Self-pay | Admitting: Internal Medicine

## 2015-03-01 DIAGNOSIS — I4891 Unspecified atrial fibrillation: Secondary | ICD-10-CM | POA: Diagnosis not present

## 2015-03-20 ENCOUNTER — Other Ambulatory Visit: Payer: Self-pay | Admitting: Internal Medicine

## 2015-04-06 ENCOUNTER — Other Ambulatory Visit: Payer: Self-pay | Admitting: *Deleted

## 2015-04-06 MED ORDER — BENAZEPRIL-HYDROCHLOROTHIAZIDE 5-6.25 MG PO TABS: 1.0000 | ORAL_TABLET | Freq: Every day | ORAL | Status: DC

## 2015-04-07 DIAGNOSIS — I4891 Unspecified atrial fibrillation: Secondary | ICD-10-CM | POA: Diagnosis not present

## 2015-04-15 ENCOUNTER — Telehealth: Payer: Self-pay | Admitting: Internal Medicine

## 2015-04-15 NOTE — Telephone Encounter (Signed)
Spoke to pt, told him Dr.K said he will manage your coumadin. Pt verbalized understanding.

## 2015-04-15 NOTE — Telephone Encounter (Signed)
Please see message and advise if you want to manage Coumadin or have Cardiology continue to manage?

## 2015-04-15 NOTE — Telephone Encounter (Signed)
Yes, we can follow his  Coumadin anticoagulation here

## 2015-04-15 NOTE — Telephone Encounter (Signed)
Pt states he has a machine to test his own coum.  Pt would like to know if we can "manage" his coum. Pt states his cardiologist had been managing, but he would like to know If we can do it instead since dr Raliegh Ip is hia pcp.

## 2015-05-04 ENCOUNTER — Other Ambulatory Visit: Payer: Self-pay | Admitting: Internal Medicine

## 2015-05-06 ENCOUNTER — Other Ambulatory Visit: Payer: Self-pay | Admitting: Internal Medicine

## 2015-05-06 ENCOUNTER — Telehealth: Payer: Self-pay | Admitting: Internal Medicine

## 2015-05-06 MED ORDER — CLINDAMYCIN HCL 150 MG PO CAPS: ORAL_CAPSULE | ORAL | Status: DC

## 2015-05-06 NOTE — Telephone Encounter (Signed)
Left detailed message Rx sent to pharmacy take 2 tablets one hour prior to dental work. Any questions please call office.

## 2015-05-06 NOTE — Telephone Encounter (Signed)
Pt is having a dental procedure  tommorow and needs clindamycin 150 mg send to [facility] in [place]

## 2015-05-06 NOTE — Telephone Encounter (Signed)
Clindamycin 150 mg  #2  Take 2 tablets one hour prior to your dental procedure

## 2015-05-06 NOTE — Telephone Encounter (Signed)
Please see message and advise 

## 2015-05-12 DIAGNOSIS — Z23 Encounter for immunization: Secondary | ICD-10-CM | POA: Diagnosis not present

## 2015-05-26 LAB — POCT INR: INR: 2.3

## 2015-06-08 ENCOUNTER — Encounter: Payer: Self-pay | Admitting: Internal Medicine

## 2015-06-10 DIAGNOSIS — I4891 Unspecified atrial fibrillation: Secondary | ICD-10-CM | POA: Diagnosis not present

## 2015-07-01 LAB — PROTIME-INR: INR: 2 — AB (ref 0.9–1.1)

## 2015-07-22 ENCOUNTER — Telehealth: Payer: Self-pay | Admitting: Internal Medicine

## 2015-07-22 LAB — PROTIME-INR: INR: 1.6 — AB (ref 0.9–1.1)

## 2015-07-22 NOTE — Telephone Encounter (Signed)
Received results via fax that patient's INR range is 2.0-3.0. Patient's last INR was 07/01/2015 and was 2.3. Data via fax indicated patient's median range is between 2.0-2.3. Spoke with Tommi Rumps about new information and he agrees patient to take half a tablet more tonight (appears on med list patient is taking 5 mg tablets) until Dr. Raliegh Ip gets into office in morning.   Called Ebony (Representative) back and they advised to contact patient about instructions. have attempted to call patient but only received voicemail.

## 2015-07-22 NOTE — Telephone Encounter (Signed)
Spoke with patient. Patient agrees INR is low for him and feels it is attributed to his diet. He agrees to take half a tablet tonight. Will update Dr. Raliegh Ip tomorrow morning with information.

## 2015-07-22 NOTE — Telephone Encounter (Signed)
Brian [name] is calling to report inr 1.6. Brian [name] will fax results

## 2015-07-22 NOTE — Telephone Encounter (Signed)
Noted. Spoke with Tommi Rumps and he agrees patient is stable until Dr. Raliegh Ip comes back to office tomorrow morning to determine intervention, if any. At this point, there is no documentation of what the expected therapeuticrange is.

## 2015-07-23 NOTE — Telephone Encounter (Signed)
Pt has been schedule for 07/29/15 at 945 am

## 2015-07-23 NOTE — Telephone Encounter (Signed)
Dr. Raliegh Ip agrees on taking the additional half tablet yesterday. He would like for patient to get onto the Coumadin Clinic schedule next week. Message forwarded to [facility] to make aware he is new to Coumadin Clinic however not new patient to Dr. Raliegh Ip. Also, message sent to schedulers to get on schedule. Marland Kitchen

## 2015-07-29 ENCOUNTER — Ambulatory Visit (INDEPENDENT_AMBULATORY_CARE_PROVIDER_SITE_OTHER): Payer: Medicare Other | Admitting: General Practice

## 2015-07-29 DIAGNOSIS — Z9889 Other specified postprocedural states: Secondary | ICD-10-CM

## 2015-07-29 LAB — POCT INR: INR: 2.5

## 2015-07-29 NOTE — Progress Notes (Signed)
Pre visit review using our clinic review tool, if applicable. No additional management support is needed unless otherwise documented below in the visit note. 

## 2015-07-31 ENCOUNTER — Other Ambulatory Visit: Payer: Self-pay | Admitting: Internal Medicine

## 2015-08-03 ENCOUNTER — Other Ambulatory Visit: Payer: Self-pay | Admitting: Internal Medicine

## 2015-08-07 LAB — POCT INR: INR: 2.7

## 2015-08-09 ENCOUNTER — Ambulatory Visit (INDEPENDENT_AMBULATORY_CARE_PROVIDER_SITE_OTHER): Payer: BLUE CROSS/BLUE SHIELD | Admitting: General Practice

## 2015-08-09 DIAGNOSIS — I4891 Unspecified atrial fibrillation: Secondary | ICD-10-CM | POA: Diagnosis not present

## 2015-08-09 NOTE — Progress Notes (Signed)
Pre visit review using our clinic review tool, if applicable. No additional management support is needed unless otherwise documented below in the visit note. 

## 2015-08-11 LAB — POCT INR: INR: 2

## 2015-08-12 ENCOUNTER — Encounter: Payer: BLUE CROSS/BLUE SHIELD | Admitting: Internal Medicine

## 2015-08-13 ENCOUNTER — Encounter: Payer: BLUE CROSS/BLUE SHIELD | Admitting: Internal Medicine

## 2015-08-17 LAB — PROTIME-INR: INR: 2 — AB (ref 0.9–1.1)

## 2015-08-18 ENCOUNTER — Telehealth: Payer: Self-pay | Admitting: *Deleted

## 2015-08-18 ENCOUNTER — Ambulatory Visit (INDEPENDENT_AMBULATORY_CARE_PROVIDER_SITE_OTHER): Payer: BLUE CROSS/BLUE SHIELD | Admitting: General Practice

## 2015-08-18 NOTE — Telephone Encounter (Signed)
Received INR results from 08/17/15. INR results are 2.0. It is documented patient's therapeutic range is 2.0-3.0. Patient is within range and no intervention is needed at this time. Did confirm with Dr. Sarajane Jews as well.

## 2015-08-18 NOTE — Progress Notes (Signed)
I have reviewed and agree with the plan. 

## 2015-08-18 NOTE — Progress Notes (Signed)
Pre visit review using our clinic review tool, if applicable. No additional management support is needed unless otherwise documented below in the visit note. 

## 2015-08-27 ENCOUNTER — Telehealth: Payer: Self-pay | Admitting: *Deleted

## 2015-08-27 LAB — POCT INR: INR: 2.6

## 2015-08-27 NOTE — Telephone Encounter (Signed)
Received fax patient's INR on 08/27/15 was 2.6. Per last anti-coagulation visit on 08/18/15, INR goal is 2.5-3.5. Thus, patient is within goal and no other interventions needed at this time. Will forward to [facility] in Coumadin Clinic to update.

## 2015-08-30 ENCOUNTER — Ambulatory Visit (INDEPENDENT_AMBULATORY_CARE_PROVIDER_SITE_OTHER): Payer: BLUE CROSS/BLUE SHIELD | Admitting: General Practice

## 2015-08-30 DIAGNOSIS — Z9889 Other specified postprocedural states: Secondary | ICD-10-CM

## 2015-09-10 LAB — POCT INR: INR: 2

## 2015-09-13 ENCOUNTER — Ambulatory Visit (INDEPENDENT_AMBULATORY_CARE_PROVIDER_SITE_OTHER): Payer: BLUE CROSS/BLUE SHIELD | Admitting: General Practice

## 2015-09-13 DIAGNOSIS — Z9889 Other specified postprocedural states: Secondary | ICD-10-CM

## 2015-09-13 NOTE — Progress Notes (Signed)
Pre visit review using our clinic review tool, if applicable. No additional management support is needed unless otherwise documented below in the visit note. 

## 2015-09-22 ENCOUNTER — Ambulatory Visit (INDEPENDENT_AMBULATORY_CARE_PROVIDER_SITE_OTHER): Payer: Medicare Other | Admitting: Internal Medicine

## 2015-09-22 ENCOUNTER — Other Ambulatory Visit: Payer: Self-pay | Admitting: *Deleted

## 2015-09-22 ENCOUNTER — Encounter: Payer: Self-pay | Admitting: Internal Medicine

## 2015-09-22 VITALS — BP 126/80 | HR 113 | Temp 98.4°F | Resp 20 | Ht 64.0 in | Wt 160.0 lb

## 2015-09-22 DIAGNOSIS — I10 Essential (primary) hypertension: Secondary | ICD-10-CM

## 2015-09-22 DIAGNOSIS — Z0000 Encounter for general adult medical examination without abnormal findings: Secondary | ICD-10-CM | POA: Diagnosis not present

## 2015-09-22 DIAGNOSIS — I2510 Atherosclerotic heart disease of native coronary artery without angina pectoris: Secondary | ICD-10-CM | POA: Diagnosis not present

## 2015-09-22 DIAGNOSIS — M159 Polyosteoarthritis, unspecified: Secondary | ICD-10-CM

## 2015-09-22 DIAGNOSIS — M8949 Other hypertrophic osteoarthropathy, multiple sites: Secondary | ICD-10-CM

## 2015-09-22 DIAGNOSIS — M150 Primary generalized (osteo)arthritis: Secondary | ICD-10-CM | POA: Diagnosis not present

## 2015-09-22 LAB — CBC WITH DIFFERENTIAL/PLATELET
BASOS PCT: 0.6 % (ref 0.0–3.0)
Basophils Absolute: 0 10*3/uL (ref 0.0–0.1)
EOS ABS: 0.1 10*3/uL (ref 0.0–0.7)
Eosinophils Relative: 1.5 % (ref 0.0–5.0)
HEMATOCRIT: 45.1 % (ref 39.0–52.0)
HEMOGLOBIN: 14.9 g/dL (ref 13.0–17.0)
LYMPHS PCT: 20.3 % (ref 12.0–46.0)
Lymphs Abs: 1.2 10*3/uL (ref 0.7–4.0)
MCHC: 33 g/dL (ref 30.0–36.0)
MCV: 90.1 fl (ref 78.0–100.0)
MONOS PCT: 9.6 % (ref 3.0–12.0)
Monocytes Absolute: 0.6 10*3/uL (ref 0.1–1.0)
Neutro Abs: 4.1 10*3/uL (ref 1.4–7.7)
Neutrophils Relative %: 68 % (ref 43.0–77.0)
Platelets: 219 10*3/uL (ref 150.0–400.0)
RBC: 5.01 Mil/uL (ref 4.22–5.81)
RDW: 14.7 % (ref 11.5–15.5)
WBC: 6.1 10*3/uL (ref 4.0–10.5)

## 2015-09-22 LAB — COMPREHENSIVE METABOLIC PANEL
ALBUMIN: 4.1 g/dL (ref 3.5–5.2)
ALT: 23 U/L (ref 0–53)
AST: 27 U/L (ref 0–37)
Alkaline Phosphatase: 109 U/L (ref 39–117)
BUN: 18 mg/dL (ref 6–23)
CALCIUM: 9.6 mg/dL (ref 8.4–10.5)
CHLORIDE: 102 meq/L (ref 96–112)
CO2: 29 mEq/L (ref 19–32)
CREATININE: 0.74 mg/dL (ref 0.40–1.50)
GFR: 107.97 mL/min (ref >60.00)
Glucose, Bld: 89 mg/dL (ref 70–99)
POTASSIUM: 4.6 meq/L (ref 3.5–5.1)
Sodium: 140 mEq/L (ref 135–145)
Total Bilirubin: 0.7 mg/dL (ref 0.2–1.2)
Total Protein: 6.6 g/dL (ref 6.0–8.3)

## 2015-09-22 LAB — LIPID PANEL
CHOL/HDL RATIO: 3
CHOLESTEROL: 153 mg/dL (ref 0–200)
HDL: 50.7 mg/dL (ref >39.00)
LDL CALC: 86 mg/dL (ref 0–99)
NonHDL: 101.85
TRIGLYCERIDES: 80 mg/dL (ref 0.0–149.0)
VLDL: 16 mg/dL (ref 0.0–40.0)

## 2015-09-22 LAB — TSH: TSH: 2.58 u[IU]/mL (ref 0.35–4.50)

## 2015-09-22 MED ORDER — METOPROLOL SUCCINATE ER 25 MG PO TB24: ORAL_TABLET | ORAL | Status: DC

## 2015-09-22 MED ORDER — FINASTERIDE 5 MG PO TABS: 5.0000 mg | ORAL_TABLET | Freq: Every day | ORAL | Status: DC

## 2015-09-22 MED ORDER — DOXAZOSIN MESYLATE 1 MG PO TABS: ORAL_TABLET | ORAL | Status: DC

## 2015-09-22 MED ORDER — ALPRAZOLAM 0.25 MG PO TABS: 0.2500 mg | ORAL_TABLET | Freq: Every day | ORAL | Status: DC

## 2015-09-22 NOTE — Progress Notes (Signed)
Subjective:    Patient ID: Brian [name], male    DOB: 04-09-1933, 82 y.o.   MRN: [id number]  HPI    Subjective:    Patient ID: Brian [name], male    DOB: 09-12-1932, 82 y.o.   MRN: [id number]  HPI  82 -year-old patient who is in today for an annual health examination.   He is followed by cardiology following mitral valve repair in 1996.  He states that he has been on chronic Coumadin anticoagulation since that time.  He has a history of atrial flutter  as well as prior history of DVT.  He now monitors, Coumadin anticoagulation at home. He states that the cardiac surgery was complicated by a pleural effusion that required treat with steroids.  This did not require surgery or pericardiocentesis.  He states that he was also treated with doxycycline at that time, which he has continued.  He also feels this helps with his chronic sinus polyposis.  He does have a history of rosacea  He has additionally, hypertension, coronary artery disease, and history of BPH.  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 Osteoarthrosis, unspecified whether generalized or localized, unspecified site   . BPH (benign prostatic hypertrophy)   . Anemia   . Hemorrhage of gastrointestinal tract, unspecified   . Epistaxis   . Atrial flutter ([place])   . Hypertension   . TIA (transient ischemic attack)   . Personal history of venous thrombosis and embolism   . Rosacea     Social History   Social History  . Marital Status: Married    Spouse Name: N/A  . Number of Children: N/A  . Years of Education: N/A   Occupational History  . Not on file.   Social History Main Topics  . Smoking status: Former Research scientist (life sciences)  . Smokeless tobacco: Not on  file     Comment: quit 50 yr ago  . Alcohol Use: No  . Drug Use: No  . Sexual Activity: Not on file   Other Topics Concern  . Not on file   Social History Narrative    Past Surgical History  Procedure Laterality Date  . Appendectomy    . Hernia repair    . Total hip arthroplasty    . Joint replacement    . Mvp repair    . Mitral valve repair      Family History  Problem Relation Age of Onset  . Mitral valve prolapse Mother   . Coronary artery disease Father     Allergies  Allergen Reactions  . Novocain [Procaine Hcl]     Irregular heart beat     . Penicillins     REACTION: itching    Current Outpatient Prescriptions on File Prior to Visit  Medication Sig Dispense Refill  . benazepril-hydrochlorthiazide (LOTENSIN HCT) 5-6.25 MG per tablet Take 1 tablet by mouth daily. 90 tablet 3  . clindamycin (CLEOCIN) 150 MG capsule TAKE 2 CAPSULES BY MOUTH PRIOR TO DENTAL WORK AS DIRECTED 2 capsule 0  . doxazosin (CARDURA) 1 MG tablet TAKE 1 TABLET BY MOUTH AT BEDTIME 90 tablet 1  . doxycycline (VIBRA-TABS) 100 MG tablet TAKE 1 TABLET BY MOUTH TWICE DAILY  180 tablet 3  . finasteride (PROSCAR) 5 MG tablet Take 1 tablet (5 mg total) by mouth daily. 90 tablet 3  . metoprolol succinate (TOPROL-XL) 25 MG 24 hr tablet TAKE 1 TABLET BY MOUTH EVERY MORNING AND 2 TABLETS BY MOUTH EVERY EVENING 270 tablet 3  . pantoprazole (PROTONIX) 40 MG tablet TAKE 1 TABLET BY MOUTH DAILY 90 tablet 3  . PROCTOZONE-HC 2.5 % rectal cream USE RECTALLY TWICE DAILY 30 g 0  . saw palmetto 160 MG capsule Take 1 capsule (160 mg total) by mouth 2 (two) times daily.    Marland Kitchen warfarin (COUMADIN) 5 MG tablet TAKE 1 TABLET BY MOUTH DAILY 90 tablet 0   No current facility-administered medications on file prior to visit.    BP 126/80 mmHg  Pulse 113  Temp(Src) 98.4 F (36.9 C) (Oral)  Resp 20  Ht 5\' 4"  (1.626 m)  Wt 160 lb (72.576 kg)  BMI 27.45 kg/m2  SpO2 98%    1. Risk factors, based on past  M,S,F  history.  Cardiovascular risk factors include hypertension.  He does have known coronary artery disease  2.  Physical activities: Remains quite active.  Still works 6 days weekly  3.  Depression/mood: No history depression or mood disorder  4.  Hearing: Moderate age-related impairment.  He is considering hearing aids  5.  ADL's: Totally independent  6.  Fall risk: Moderate to high due to arthritis and unsteadiness  7.  Home safety: No problems identified  8.  Height weight, and visual acuity; height and weight stable no change in visual acuity  9.  Counseling: Continue heart healthy diet and maintain ideal weight  10. Lab orders based on risk factors: Laboratory update including lipid panel will be reviewed  11. Referral : GI referral for symptomatic hemorrhoids.  Follow-up cardiology  12. Care plan: Continue home Coumadin monitoring and heart healthy diet  13. Cognitive assessment: Alert and oriented, normal affect.  No cognitive deficits  14. Screening: Continue annual health screening. Patient was provided with a written and personalized care plan  15. Provider List Update:  includes cardiology GI and primary care medicine      Review of Systems  Constitutional: Negative for fever, chills, appetite change and fatigue.  HENT: Negative for congestion, dental problem, ear pain, hearing loss, sore throat, tinnitus, trouble swallowing and voice change.   Eyes: Negative for pain, discharge and visual disturbance.  Respiratory: Negative for cough, chest tightness, wheezing and stridor.   Cardiovascular: Negative for chest pain, palpitations and leg swelling.  Gastrointestinal: Negative for nausea, vomiting, abdominal pain, diarrhea, constipation, blood in stool and abdominal distention.  Genitourinary: Negative for urgency, hematuria, flank pain, discharge, difficulty urinating and genital sores.  Musculoskeletal: Negative for myalgias, back pain, joint swelling, arthralgias,  gait problem and neck stiffness.  Skin: Negative for rash.  Neurological: Negative for dizziness, syncope, speech difficulty, weakness, numbness and headaches.  Hematological: Negative for adenopathy. Does not bruise/bleed easily.  Psychiatric/Behavioral: Negative for behavioral problems and dysphoric mood. The patient is not nervous/anxious.        Objective:   Physical Exam  Constitutional: He appears well-developed and well-nourished.  HENT:  Head: Normocephalic and atraumatic.  Right Ear: External ear normal.  Left Ear: External ear normal.  Nose: Nose normal.  Mouth/Throat: Oropharynx is clear and moist.  Eyes: Conjunctivae and EOM are normal. Pupils are equal, round, and reactive to light. No scleral icterus.  Neck: Normal range of motion. Neck supple. No JVD present.  No thyromegaly present.  Cardiovascular: Normal heart sounds and intact distal pulses.  Exam reveals no gallop and no friction rub.   No murmur heard. Irregular rhythm with controlled ventricular response  Pulmonary/Chest: Effort normal and breath sounds normal. He exhibits no tenderness.  Significant scoliosis  Abdominal: Soft. Bowel sounds are normal. He exhibits no distension and no mass. There is no tenderness.  Ventral hernia  Genitourinary: Prostate normal and penis normal.  External hemorrhoids Stool, hematest negative Prostate plus 3  Musculoskeletal: Normal range of motion. He exhibits no edema or tenderness.  Lymphadenopathy:    He has no cervical adenopathy.  Neurological: He is alert. He has normal reflexes. No cranial nerve deficit. Coordination normal.  Skin: Skin is warm and dry. No rash noted.  Psychiatric: He has a normal mood and affect. His behavior is normal.          Assessment & Plan:  Preventive health examination Atrial fibrillation.  We'll continue present regimen and chronic Coumadin anticoagulation Status post mitral valve repair Hypertension well controlled Coronary artery  disease.  Follow cardiology      Review of Systems  HENT: Positive for hearing loss.   Musculoskeletal: Positive for back pain, arthralgias and gait problem.       Objective:   Physical Exam  As above      Assessment & Plan:   As above We'll check updated lab Patient will continue antibiotic prophylaxis prior to dental and other procedures Return in 6 months for follow-up

## 2015-09-22 NOTE — Patient Instructions (Signed)
Limit your sodium (Salt) intake  Return in 6 months for follow-up  

## 2015-09-22 NOTE — Progress Notes (Signed)
Pre visit review using our clinic review tool, if applicable. No additional management support is needed unless otherwise documented below in the visit note. 

## 2015-09-23 ENCOUNTER — Other Ambulatory Visit: Payer: Self-pay | Admitting: Internal Medicine

## 2015-09-23 DIAGNOSIS — I4891 Unspecified atrial fibrillation: Secondary | ICD-10-CM | POA: Diagnosis not present

## 2015-09-30 ENCOUNTER — Telehealth: Payer: Self-pay | Admitting: *Deleted

## 2015-09-30 NOTE — Telephone Encounter (Signed)
Left message to call office. PT/INR from 2/2 stable per Dr.K, continue present therapy.

## 2015-10-02 ENCOUNTER — Other Ambulatory Visit: Payer: Self-pay | Admitting: Internal Medicine

## 2015-10-04 ENCOUNTER — Ambulatory Visit (INDEPENDENT_AMBULATORY_CARE_PROVIDER_SITE_OTHER): Payer: BLUE CROSS/BLUE SHIELD | Admitting: General Practice

## 2015-10-04 DIAGNOSIS — Z9889 Other specified postprocedural states: Secondary | ICD-10-CM

## 2015-10-04 LAB — POCT INR: INR: 2.4

## 2015-10-04 NOTE — Progress Notes (Signed)
Pre visit review using our clinic review tool, if applicable. No additional management support is needed unless otherwise documented below in the visit note. 

## 2015-10-05 NOTE — Telephone Encounter (Signed)
Pt called back, notified PT/INR stable and to continue present therapy per Dr.K. Pt verbalized understanding.

## 2015-10-11 LAB — POCT INR: INR: 2

## 2015-10-12 ENCOUNTER — Telehealth: Payer: Self-pay | Admitting: General Practice

## 2015-10-12 ENCOUNTER — Ambulatory Visit (INDEPENDENT_AMBULATORY_CARE_PROVIDER_SITE_OTHER): Payer: BLUE CROSS/BLUE SHIELD | Admitting: General Practice

## 2015-10-12 DIAGNOSIS — Z9889 Other specified postprocedural states: Secondary | ICD-10-CM

## 2015-10-12 NOTE — Progress Notes (Signed)
Pre visit review using our clinic review tool, if applicable. No additional management support is needed unless otherwise documented below in the visit note. 

## 2015-10-12 NOTE — Progress Notes (Signed)
I have reviewed and agree with the plan. 

## 2015-10-12 NOTE — Telephone Encounter (Signed)
Dosing instructions for coumadin given to patient.  Pt verbalized instructions.

## 2015-10-21 ENCOUNTER — Telehealth: Payer: Self-pay | Admitting: Internal Medicine

## 2015-10-21 ENCOUNTER — Telehealth: Payer: Self-pay

## 2015-10-21 NOTE — Telephone Encounter (Signed)
Spoke to pt, told him PA for Alprazolam was denied, they will not pay for it. They will be mailing a letter. Pt verbalized understanding and said he will just pay for it. Told pt okay.

## 2015-10-21 NOTE — Telephone Encounter (Signed)
See below

## 2015-10-21 NOTE — Telephone Encounter (Signed)
[employer] is calling to DENY pt's rx  ALPRAZolam (XANAX) 0.25 MG tablet  Derek relayed the medicine is not FDA labeled or for medically accepted use.  A letter will be sent out.

## 2015-10-21 NOTE — Telephone Encounter (Signed)
Error

## 2015-10-26 ENCOUNTER — Ambulatory Visit (INDEPENDENT_AMBULATORY_CARE_PROVIDER_SITE_OTHER): Payer: BLUE CROSS/BLUE SHIELD | Admitting: General Practice

## 2015-10-26 DIAGNOSIS — Z9889 Other specified postprocedural states: Secondary | ICD-10-CM

## 2015-10-26 LAB — POCT INR: INR: 2.7

## 2015-11-01 ENCOUNTER — Other Ambulatory Visit: Payer: Self-pay | Admitting: Internal Medicine

## 2015-11-03 LAB — POCT INR: INR: 2.8

## 2015-11-04 DIAGNOSIS — I4891 Unspecified atrial fibrillation: Secondary | ICD-10-CM | POA: Diagnosis not present

## 2015-11-05 ENCOUNTER — Ambulatory Visit (INDEPENDENT_AMBULATORY_CARE_PROVIDER_SITE_OTHER): Payer: BLUE CROSS/BLUE SHIELD | Admitting: General Practice

## 2015-11-05 DIAGNOSIS — Z9889 Other specified postprocedural states: Secondary | ICD-10-CM

## 2015-11-05 NOTE — Progress Notes (Signed)
Pre visit review using our clinic review tool, if applicable. No additional management support is needed unless otherwise documented below in the visit note. 

## 2015-11-05 NOTE — Progress Notes (Signed)
I have reviewed and agree with the plan. 

## 2015-11-09 ENCOUNTER — Other Ambulatory Visit: Payer: Self-pay | Admitting: Internal Medicine

## 2015-11-09 NOTE — Telephone Encounter (Signed)
Okay #6

## 2015-11-09 NOTE — Telephone Encounter (Signed)
Please advise if okay to refill. 

## 2015-11-11 ENCOUNTER — Ambulatory Visit (INDEPENDENT_AMBULATORY_CARE_PROVIDER_SITE_OTHER): Payer: BLUE CROSS/BLUE SHIELD | Admitting: General Practice

## 2015-11-11 DIAGNOSIS — Z9889 Other specified postprocedural states: Secondary | ICD-10-CM

## 2015-11-11 LAB — POCT INR: INR: 2.1

## 2015-11-11 NOTE — Progress Notes (Signed)
Pre visit review using our clinic review tool, if applicable. No additional management support is needed unless otherwise documented below in the visit note. 

## 2015-11-22 LAB — POCT INR: INR: 2.9

## 2015-11-23 ENCOUNTER — Ambulatory Visit (INDEPENDENT_AMBULATORY_CARE_PROVIDER_SITE_OTHER): Payer: BLUE CROSS/BLUE SHIELD | Admitting: General Practice

## 2015-11-23 DIAGNOSIS — Z9889 Other specified postprocedural states: Secondary | ICD-10-CM

## 2015-11-23 NOTE — Progress Notes (Signed)
Pre visit review using our clinic review tool, if applicable. No additional management support is needed unless otherwise documented below in the visit note. 

## 2015-12-14 ENCOUNTER — Ambulatory Visit (INDEPENDENT_AMBULATORY_CARE_PROVIDER_SITE_OTHER): Payer: BLUE CROSS/BLUE SHIELD | Admitting: General Practice

## 2015-12-14 ENCOUNTER — Other Ambulatory Visit: Payer: Self-pay | Admitting: Internal Medicine

## 2015-12-14 DIAGNOSIS — Z9889 Other specified postprocedural states: Secondary | ICD-10-CM

## 2015-12-14 DIAGNOSIS — I4891 Unspecified atrial fibrillation: Secondary | ICD-10-CM | POA: Diagnosis not present

## 2015-12-14 LAB — POCT INR: INR: 2.2

## 2015-12-14 NOTE — Progress Notes (Signed)
Pre visit review using our clinic review tool, if applicable. No additional management support is needed unless otherwise documented below in the visit note. 

## 2015-12-22 ENCOUNTER — Ambulatory Visit (INDEPENDENT_AMBULATORY_CARE_PROVIDER_SITE_OTHER): Payer: BLUE CROSS/BLUE SHIELD | Admitting: General Practice

## 2015-12-22 DIAGNOSIS — Z9889 Other specified postprocedural states: Secondary | ICD-10-CM

## 2015-12-22 LAB — POCT INR: INR: 2.4

## 2015-12-22 NOTE — Progress Notes (Signed)
Pre visit review using our clinic review tool, if applicable. No additional management support is needed unless otherwise documented below in the visit note. 

## 2015-12-30 LAB — POCT INR: INR: 2.7

## 2015-12-31 ENCOUNTER — Ambulatory Visit (INDEPENDENT_AMBULATORY_CARE_PROVIDER_SITE_OTHER): Payer: BLUE CROSS/BLUE SHIELD | Admitting: General Practice

## 2015-12-31 DIAGNOSIS — Z9889 Other specified postprocedural states: Secondary | ICD-10-CM

## 2015-12-31 NOTE — Progress Notes (Signed)
I have reviewed and agree with the plan. 

## 2015-12-31 NOTE — Progress Notes (Signed)
Pre visit review using our clinic review tool, if applicable. No additional management support is needed unless otherwise documented below in the visit note. 

## 2016-01-12 ENCOUNTER — Ambulatory Visit (INDEPENDENT_AMBULATORY_CARE_PROVIDER_SITE_OTHER): Payer: BLUE CROSS/BLUE SHIELD | Admitting: General Practice

## 2016-01-12 DIAGNOSIS — Z9889 Other specified postprocedural states: Secondary | ICD-10-CM

## 2016-01-12 LAB — POCT INR: INR: 2.4

## 2016-01-12 NOTE — Progress Notes (Signed)
Pre visit review using our clinic review tool, if applicable. No additional management support is needed unless otherwise documented below in the visit note. 

## 2016-01-24 DIAGNOSIS — I4891 Unspecified atrial fibrillation: Secondary | ICD-10-CM | POA: Diagnosis not present

## 2016-01-26 ENCOUNTER — Ambulatory Visit (INDEPENDENT_AMBULATORY_CARE_PROVIDER_SITE_OTHER): Payer: BLUE CROSS/BLUE SHIELD | Admitting: General Practice

## 2016-01-26 DIAGNOSIS — Z9889 Other specified postprocedural states: Secondary | ICD-10-CM

## 2016-01-26 LAB — POCT INR: INR: 2.9

## 2016-01-26 NOTE — Progress Notes (Signed)
Pre visit review using our clinic review tool, if applicable. No additional management support is needed unless otherwise documented below in the visit note. 

## 2016-02-07 ENCOUNTER — Ambulatory Visit (INDEPENDENT_AMBULATORY_CARE_PROVIDER_SITE_OTHER): Payer: BLUE CROSS/BLUE SHIELD | Admitting: General Practice

## 2016-02-07 DIAGNOSIS — Z9889 Other specified postprocedural states: Secondary | ICD-10-CM

## 2016-02-07 LAB — POCT INR: INR: 2.6

## 2016-02-07 NOTE — Progress Notes (Signed)
Pre visit review using our clinic review tool, if applicable. No additional management support is needed unless otherwise documented below in the visit note. 

## 2016-02-14 LAB — POCT INR: INR: 2.8

## 2016-02-15 ENCOUNTER — Ambulatory Visit (INDEPENDENT_AMBULATORY_CARE_PROVIDER_SITE_OTHER): Payer: BLUE CROSS/BLUE SHIELD | Admitting: General Practice

## 2016-02-15 DIAGNOSIS — Z9889 Other specified postprocedural states: Secondary | ICD-10-CM

## 2016-02-15 NOTE — Progress Notes (Signed)
Pre visit review using our clinic review tool, if applicable. No additional management support is needed unless otherwise documented below in the visit note. 

## 2016-02-24 ENCOUNTER — Ambulatory Visit (INDEPENDENT_AMBULATORY_CARE_PROVIDER_SITE_OTHER): Payer: BLUE CROSS/BLUE SHIELD | Admitting: General Practice

## 2016-02-24 DIAGNOSIS — Z9889 Other specified postprocedural states: Secondary | ICD-10-CM

## 2016-02-24 LAB — POCT INR: INR: 2.8

## 2016-02-24 NOTE — Progress Notes (Signed)
I agree with this plan.

## 2016-02-24 NOTE — Progress Notes (Signed)
Pre visit review using our clinic review tool, if applicable. No additional management support is needed unless otherwise documented below in the visit note. 

## 2016-03-03 DIAGNOSIS — I4891 Unspecified atrial fibrillation: Secondary | ICD-10-CM | POA: Diagnosis not present

## 2016-03-03 LAB — PROTIME-INR: INR: 2.4 — AB (ref 0.9–1.1)

## 2016-03-08 ENCOUNTER — Other Ambulatory Visit: Payer: Self-pay | Admitting: Internal Medicine

## 2016-03-08 NOTE — Telephone Encounter (Signed)
Rx refill sent to pharmacy. 

## 2016-03-10 ENCOUNTER — Encounter: Payer: Self-pay | Admitting: Internal Medicine

## 2016-03-22 ENCOUNTER — Encounter: Payer: Self-pay | Admitting: Internal Medicine

## 2016-03-22 ENCOUNTER — Ambulatory Visit (INDEPENDENT_AMBULATORY_CARE_PROVIDER_SITE_OTHER): Payer: Medicare Other | Admitting: Internal Medicine

## 2016-03-22 VITALS — BP 134/78 | HR 68 | Temp 98.2°F | Wt 163.6 lb

## 2016-03-22 DIAGNOSIS — I10 Essential (primary) hypertension: Secondary | ICD-10-CM

## 2016-03-22 DIAGNOSIS — M8949 Other hypertrophic osteoarthropathy, multiple sites: Secondary | ICD-10-CM

## 2016-03-22 DIAGNOSIS — M159 Polyosteoarthritis, unspecified: Secondary | ICD-10-CM

## 2016-03-22 DIAGNOSIS — I2510 Atherosclerotic heart disease of native coronary artery without angina pectoris: Secondary | ICD-10-CM | POA: Diagnosis not present

## 2016-03-22 DIAGNOSIS — M150 Primary generalized (osteo)arthritis: Secondary | ICD-10-CM | POA: Diagnosis not present

## 2016-03-22 MED ORDER — ALPRAZOLAM 0.25 MG PO TABS: 0.2500 mg | ORAL_TABLET | Freq: Every day | ORAL | 1 refills | Status: DC

## 2016-03-22 MED ORDER — FINASTERIDE 5 MG PO TABS: 5.0000 mg | ORAL_TABLET | Freq: Every day | ORAL | 3 refills | Status: DC

## 2016-03-22 MED ORDER — BENAZEPRIL-HYDROCHLOROTHIAZIDE 5-6.25 MG PO TABS: 1.0000 | ORAL_TABLET | Freq: Every day | ORAL | 3 refills | Status: DC

## 2016-03-22 MED ORDER — WARFARIN SODIUM 5 MG PO TABS: ORAL_TABLET | ORAL | 3 refills | Status: DC

## 2016-03-22 MED ORDER — METOPROLOL SUCCINATE ER 25 MG PO TB24: ORAL_TABLET | ORAL | 3 refills | Status: DC

## 2016-03-22 MED ORDER — DOXAZOSIN MESYLATE 1 MG PO TABS: ORAL_TABLET | ORAL | 3 refills | Status: DC

## 2016-03-22 MED ORDER — PANTOPRAZOLE SODIUM 40 MG PO TBEC: 40.0000 mg | DELAYED_RELEASE_TABLET | Freq: Every day | ORAL | 3 refills | Status: DC

## 2016-03-22 NOTE — Progress Notes (Signed)
Subjective:    Patient ID: Brian [name], male    DOB: 12-19-1932, 82 y.o.   MRN: [id number]  HPI  82 year old patient who is seen today for his six-month follow-up.  He is doing recently well.  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  He has chronic atrial fibrillation and remains on Coumadin anticoagulation.  He has treated hypertension that has been stable. No new concerns or complaints except for some modest weight gain over the past 6 months.  He attributes this to decrease in activity due to worsening arthritis  Past Medical History:  Diagnosis Date  . Anemia   . Atrial flutter ([place])   . BPH (benign prostatic hypertrophy)   . Epistaxis   . Hemorrhage of gastrointestinal tract, unspecified   . Hypertension   . Osteoarthrosis, unspecified whether generalized or localized, unspecified site   . Personal history of venous thrombosis and embolism   . Rosacea   . TIA (transient ischemic attack)      Social History   Social History  . Marital status: Married    Spouse name: N/A  . Number of children: N/A  . Years of education: N/A   Occupational History  . Not on file.   Social History Main Topics  . Smoking status: Former Research scientist (life sciences)  . Smokeless tobacco: Not on file     Comment: quit 50 yr ago  . Alcohol use No  . Drug use: No  . Sexual activity: Not on file   Other Topics Concern  . Not on file   Social History Narrative  . No narrative on file    Past Surgical History:  Procedure Laterality Date  . APPENDECTOMY    . HERNIA REPAIR    . JOINT REPLACEMENT    . MITRAL VALVE REPAIR    . mvp repair    . TOTAL HIP ARTHROPLASTY      Family History  Problem Relation Age of Onset  . Mitral valve prolapse Mother   . Coronary artery disease Father     Allergies  Allergen Reactions  . Novocain [Procaine Hcl]     Irregular heart beat     . Penicillins     REACTION: itching    Current  Outpatient Prescriptions on File Prior to Visit  Medication Sig Dispense Refill  . ALPRAZolam (XANAX) 0.25 MG tablet Take 1 tablet (0.25 mg total) by mouth at bedtime. 90 tablet 1  . benazepril-hydrochlorthiazide (LOTENSIN HCT) 5-6.25 MG per tablet Take 1 tablet by mouth daily. 90 tablet 3  . clindamycin (CLEOCIN) 150 MG capsule TAKE 2 CAPSULES BY MOUTH PRIOR TO DENTAL WORK AS DIRECTED 2 capsule 2  . doxazosin (CARDURA) 1 MG tablet TAKE 1 TABLET BY MOUTH AT BEDTIME 90 tablet 3  . doxycycline (VIBRA-TABS) 100 MG tablet TAKE 1 TABLET BY MOUTH TWICE DAILY 180 tablet 0  . finasteride (PROSCAR) 5 MG tablet Take 1 tablet (5 mg total) by mouth daily. 90 tablet 3  . metoprolol succinate (TOPROL-XL) 25 MG 24 hr tablet TAKE 1 TABLET BY MOUTH EVERY MORNING AND 2 TABLETS BY MOUTH EVERY EVENING 270 tablet 3  . pantoprazole (PROTONIX) 40 MG tablet TAKE 1 TABLET BY MOUTH DAILY 90 tablet 3  . PROCTOZONE-HC 2.5 % rectal cream USE RECTALLY TWICE DAILY 30 g 0  . saw palmetto 160 MG capsule Take 1 capsule (160 mg total) by mouth 2 (two) times daily.    Marland Kitchen  warfarin (COUMADIN) 5 MG tablet Take 1 tablet by mouth daily or as directed. 90 tablet 3   No current facility-administered medications on file prior to visit.     BP 134/78 (BP Location: Left Arm, Patient Position: Sitting, Cuff Size: Normal)   Pulse 68   Temp 98.2 F (36.8 C) (Oral)   Wt 163 lb 9.6 oz (74.2 kg)   SpO2 96%   BMI 28.08 kg/m     Review of Systems  Constitutional: Negative for appetite change, chills, fatigue and fever.  HENT: Negative for congestion, dental problem, ear pain, hearing loss, sore throat, tinnitus, trouble swallowing and voice change.   Eyes: Negative for pain, discharge and visual disturbance.  Respiratory: Negative for cough, chest tightness, wheezing and stridor.   Cardiovascular: Negative for chest pain, palpitations and leg swelling.  Gastrointestinal: Negative for abdominal distention, abdominal pain, blood in stool,  constipation, diarrhea, nausea and vomiting.  Genitourinary: Negative for difficulty urinating, discharge, flank pain, genital sores, hematuria and urgency.  Musculoskeletal: Positive for arthralgias, back pain and gait problem. Negative for joint swelling, myalgias and neck stiffness.  Skin: Negative for rash.  Neurological: Negative for dizziness, syncope, speech difficulty, weakness, numbness and headaches.  Hematological: Negative for adenopathy. Does not bruise/bleed easily.  Psychiatric/Behavioral: Negative for behavioral problems and dysphoric mood. The patient is not nervous/anxious.        Objective:   Physical Exam  Constitutional: He is oriented to person, place, and time. He appears well-developed.  HENT:  Head: Normocephalic.  Right Ear: External ear normal.  Left Ear: External ear normal.  Eyes: Conjunctivae and EOM are normal.  Neck: Normal range of motion.  Cardiovascular: Normal rate and normal heart sounds.   Irregular with controlled ventricular response  Status post sternotomy  Pulmonary/Chest: Breath sounds normal.  Scoliosis  Abdominal: Bowel sounds are normal.  Musculoskeletal: Normal range of motion. He exhibits no edema or tenderness.  Neurological: He is alert and oriented to person, place, and time.  Psychiatric: He has a normal mood and affect. His behavior is normal.          Assessment & Plan:   Chronic atrial fibrillation.  Continue rate control and anticoagulation Essential hypertension, stable Osteoarthritis Status post surgery for rheumatic mitral valve disease  Cardiology follow-up  Follow-up here 6 months  All medications renewed  Nyoka Cowden, MD

## 2016-03-22 NOTE — Progress Notes (Signed)
Pre visit review using our clinic review tool, if applicable. No additional management support is needed unless otherwise documented below in the visit note. 

## 2016-03-22 NOTE — Patient Instructions (Signed)
Limit your sodium (Salt) intake  Return in 6 months for follow-up  Cardiology follow-up as scheduled  Follow-up Coumadin clinic as scheduled

## 2016-03-27 ENCOUNTER — Telehealth: Payer: Self-pay

## 2016-03-27 NOTE — Telephone Encounter (Signed)
error 

## 2016-03-27 NOTE — Telephone Encounter (Signed)
Prior Authorization for ALPRAZolam Duanne Moron) 0.25 MG tablet filed  Key: [id number]

## 2016-03-31 ENCOUNTER — Telehealth: Payer: Self-pay

## 2016-03-31 NOTE — Telephone Encounter (Signed)
Prior Authorization denial received for ALPRAZolam Duanne Moron) 0.25 MG tablet  We have denied coverage of the following medication that you or your doctor requested: Non-Formulary Exception request for Alprazolam for treatment of unspecified insomnia

## 2016-04-06 ENCOUNTER — Ambulatory Visit: Payer: Self-pay | Admitting: General Practice

## 2016-04-06 DIAGNOSIS — Z9889 Other specified postprocedural states: Secondary | ICD-10-CM

## 2016-04-06 LAB — POCT INR: INR: 2.8

## 2016-04-06 NOTE — Progress Notes (Signed)
I have reviewed and agree with the plan. 

## 2016-04-28 ENCOUNTER — Ambulatory Visit: Payer: Self-pay | Admitting: General Practice

## 2016-04-28 DIAGNOSIS — Z9889 Other specified postprocedural states: Secondary | ICD-10-CM

## 2016-04-28 LAB — POCT INR: INR: 2.8

## 2016-04-30 NOTE — Progress Notes (Signed)
I have reviewed and agree with the plan. 

## 2016-05-04 ENCOUNTER — Emergency Department (HOSPITAL_COMMUNITY): Payer: Medicare Other | Admitting: Anesthesiology

## 2016-05-04 ENCOUNTER — Emergency Department (HOSPITAL_COMMUNITY): Payer: Medicare Other

## 2016-05-04 ENCOUNTER — Inpatient Hospital Stay (HOSPITAL_COMMUNITY)
Admission: EM | Admit: 2016-05-04 | Discharge: 2016-05-10 | DRG: 023 | Disposition: A | Discharge status: Home Health | Payer: Medicare Other | Attending: Neurology | Admitting: Neurology

## 2016-05-04 ENCOUNTER — Encounter (HOSPITAL_COMMUNITY): Payer: Self-pay | Admitting: Emergency Medicine

## 2016-05-04 ENCOUNTER — Inpatient Hospital Stay (HOSPITAL_COMMUNITY): Payer: Medicare Other

## 2016-05-04 ENCOUNTER — Encounter (HOSPITAL_COMMUNITY)
Admission: EM | Disposition: A | Discharge status: Home Health | Payer: Self-pay | Source: Home / Self Care | Attending: Neurology

## 2016-05-04 DIAGNOSIS — Z87891 Personal history of nicotine dependence: Secondary | ICD-10-CM | POA: Diagnosis not present

## 2016-05-04 DIAGNOSIS — I10 Essential (primary) hypertension: Secondary | ICD-10-CM

## 2016-05-04 DIAGNOSIS — Z86718 Personal history of other venous thrombosis and embolism: Secondary | ICD-10-CM

## 2016-05-04 DIAGNOSIS — Z96649 Presence of unspecified artificial hip joint: Secondary | ICD-10-CM | POA: Diagnosis present

## 2016-05-04 DIAGNOSIS — G9340 Encephalopathy, unspecified: Secondary | ICD-10-CM | POA: Diagnosis present

## 2016-05-04 DIAGNOSIS — G459 Transient cerebral ischemic attack, unspecified: Secondary | ICD-10-CM | POA: Diagnosis not present

## 2016-05-04 DIAGNOSIS — I97638 Postprocedural hematoma of a circulatory system organ or structure following other circulatory system procedure: Secondary | ICD-10-CM | POA: Diagnosis not present

## 2016-05-04 DIAGNOSIS — Z7901 Long term (current) use of anticoagulants: Secondary | ICD-10-CM

## 2016-05-04 DIAGNOSIS — I639 Cerebral infarction, unspecified: Secondary | ICD-10-CM | POA: Diagnosis not present

## 2016-05-04 DIAGNOSIS — Z884 Allergy status to anesthetic agent status: Secondary | ICD-10-CM | POA: Diagnosis not present

## 2016-05-04 DIAGNOSIS — I959 Hypotension, unspecified: Secondary | ICD-10-CM | POA: Diagnosis not present

## 2016-05-04 DIAGNOSIS — Z952 Presence of prosthetic heart valve: Secondary | ICD-10-CM | POA: Diagnosis not present

## 2016-05-04 DIAGNOSIS — D62 Acute posthemorrhagic anemia: Secondary | ICD-10-CM

## 2016-05-04 DIAGNOSIS — W010XXA Fall on same level from slipping, tripping and stumbling without subsequent striking against object, initial encounter: Secondary | ICD-10-CM | POA: Diagnosis present

## 2016-05-04 DIAGNOSIS — I481 Persistent atrial fibrillation: Secondary | ICD-10-CM | POA: Diagnosis not present

## 2016-05-04 DIAGNOSIS — Q251 Coarctation of aorta: Secondary | ICD-10-CM

## 2016-05-04 DIAGNOSIS — E876 Hypokalemia: Secondary | ICD-10-CM | POA: Diagnosis present

## 2016-05-04 DIAGNOSIS — R29810 Facial weakness: Secondary | ICD-10-CM | POA: Diagnosis present

## 2016-05-04 DIAGNOSIS — I618 Other nontraumatic intracerebral hemorrhage: Secondary | ICD-10-CM | POA: Diagnosis not present

## 2016-05-04 DIAGNOSIS — Y92000 Kitchen of unspecified non-institutional (private) residence as  the place of occurrence of the external cause: Secondary | ICD-10-CM | POA: Diagnosis not present

## 2016-05-04 DIAGNOSIS — Z9289 Personal history of other medical treatment: Secondary | ICD-10-CM | POA: Diagnosis not present

## 2016-05-04 DIAGNOSIS — Z79899 Other long term (current) drug therapy: Secondary | ICD-10-CM

## 2016-05-04 DIAGNOSIS — I69322 Dysarthria following cerebral infarction: Secondary | ICD-10-CM

## 2016-05-04 DIAGNOSIS — J9600 Acute respiratory failure, unspecified whether with hypoxia or hypercapnia: Secondary | ICD-10-CM | POA: Diagnosis not present

## 2016-05-04 DIAGNOSIS — I480 Paroxysmal atrial fibrillation: Secondary | ICD-10-CM | POA: Diagnosis present

## 2016-05-04 DIAGNOSIS — R531 Weakness: Secondary | ICD-10-CM | POA: Diagnosis not present

## 2016-05-04 DIAGNOSIS — Z23 Encounter for immunization: Secondary | ICD-10-CM

## 2016-05-04 DIAGNOSIS — I6300 Cerebral infarction due to thrombosis of unspecified precerebral artery: Secondary | ICD-10-CM

## 2016-05-04 DIAGNOSIS — I69354 Hemiplegia and hemiparesis following cerebral infarction affecting left non-dominant side: Secondary | ICD-10-CM

## 2016-05-04 DIAGNOSIS — J9690 Respiratory failure, unspecified, unspecified whether with hypoxia or hypercapnia: Secondary | ICD-10-CM

## 2016-05-04 DIAGNOSIS — I63411 Cerebral infarction due to embolism of right middle cerebral artery: Secondary | ICD-10-CM | POA: Diagnosis not present

## 2016-05-04 DIAGNOSIS — R0682 Tachypnea, not elsewhere classified: Secondary | ICD-10-CM

## 2016-05-04 DIAGNOSIS — I4892 Unspecified atrial flutter: Secondary | ICD-10-CM | POA: Diagnosis present

## 2016-05-04 DIAGNOSIS — Z8673 Personal history of transient ischemic attack (TIA), and cerebral infarction without residual deficits: Secondary | ICD-10-CM | POA: Diagnosis not present

## 2016-05-04 DIAGNOSIS — I6601 Occlusion and stenosis of right middle cerebral artery: Secondary | ICD-10-CM | POA: Diagnosis not present

## 2016-05-04 DIAGNOSIS — R262 Difficulty in walking, not elsewhere classified: Secondary | ICD-10-CM

## 2016-05-04 DIAGNOSIS — I63311 Cerebral infarction due to thrombosis of right middle cerebral artery: Secondary | ICD-10-CM

## 2016-05-04 DIAGNOSIS — M1990 Unspecified osteoarthritis, unspecified site: Secondary | ICD-10-CM | POA: Diagnosis not present

## 2016-05-04 DIAGNOSIS — I6789 Other cerebrovascular disease: Secondary | ICD-10-CM | POA: Diagnosis not present

## 2016-05-04 DIAGNOSIS — Z4659 Encounter for fitting and adjustment of other gastrointestinal appliance and device: Secondary | ICD-10-CM | POA: Diagnosis not present

## 2016-05-04 DIAGNOSIS — R4781 Slurred speech: Secondary | ICD-10-CM | POA: Diagnosis not present

## 2016-05-04 DIAGNOSIS — J9601 Acute respiratory failure with hypoxia: Secondary | ICD-10-CM | POA: Diagnosis not present

## 2016-05-04 DIAGNOSIS — I69391 Dysphagia following cerebral infarction: Secondary | ICD-10-CM

## 2016-05-04 DIAGNOSIS — J9811 Atelectasis: Secondary | ICD-10-CM | POA: Diagnosis not present

## 2016-05-04 DIAGNOSIS — I4819 Other persistent atrial fibrillation: Secondary | ICD-10-CM

## 2016-05-04 DIAGNOSIS — G8194 Hemiplegia, unspecified affecting left nondominant side: Secondary | ICD-10-CM | POA: Diagnosis present

## 2016-05-04 DIAGNOSIS — I2510 Atherosclerotic heart disease of native coronary artery without angina pectoris: Secondary | ICD-10-CM

## 2016-05-04 DIAGNOSIS — R1310 Dysphagia, unspecified: Secondary | ICD-10-CM

## 2016-05-04 DIAGNOSIS — Z9889 Other specified postprocedural states: Secondary | ICD-10-CM

## 2016-05-04 DIAGNOSIS — D696 Thrombocytopenia, unspecified: Secondary | ICD-10-CM | POA: Diagnosis not present

## 2016-05-04 DIAGNOSIS — Z880 Allergy status to penicillin: Secondary | ICD-10-CM

## 2016-05-04 DIAGNOSIS — G8192 Hemiplegia, unspecified affecting left dominant side: Secondary | ICD-10-CM | POA: Diagnosis not present

## 2016-05-04 DIAGNOSIS — E785 Hyperlipidemia, unspecified: Secondary | ICD-10-CM | POA: Diagnosis present

## 2016-05-04 DIAGNOSIS — I619 Nontraumatic intracerebral hemorrhage, unspecified: Secondary | ICD-10-CM

## 2016-05-04 HISTORY — PX: RADIOLOGY WITH ANESTHESIA: SHX6223

## 2016-05-04 HISTORY — PX: IR GENERIC HISTORICAL: IMG1180011

## 2016-05-04 LAB — DIFFERENTIAL
BASOS ABS: 0 10*3/uL (ref 0.0–0.1)
BASOS PCT: 0 %
Eosinophils Absolute: 0.1 10*3/uL (ref 0.0–0.7)
Eosinophils Relative: 1 %
LYMPHS ABS: 0.7 10*3/uL (ref 0.7–4.0)
LYMPHS PCT: 8 %
MONO ABS: 0.6 10*3/uL (ref 0.1–1.0)
MONOS PCT: 6 %
NEUTROS ABS: 7.9 10*3/uL — AB (ref 1.7–7.7)
Neutrophils Relative %: 85 %

## 2016-05-04 LAB — I-STAT TROPONIN, ED: TROPONIN I, POC: 0.02 ng/mL (ref 0.00–0.08)

## 2016-05-04 LAB — CBC
HEMATOCRIT: 41.4 % (ref 39.0–52.0)
HEMOGLOBIN: 13.7 g/dL (ref 13.0–17.0)
MCH: 29.8 pg (ref 26.0–34.0)
MCHC: 33.1 g/dL (ref 30.0–36.0)
MCV: 90.2 fL (ref 78.0–100.0)
Platelets: 141 10*3/uL — ABNORMAL LOW (ref 150–400)
RBC: 4.59 MIL/uL (ref 4.22–5.81)
RDW: 14.8 % (ref 11.5–15.5)
WBC: 9.3 10*3/uL (ref 4.0–10.5)

## 2016-05-04 LAB — I-STAT CHEM 8, ED
BUN: 20 mg/dL (ref 6–20)
CHLORIDE: 101 mmol/L (ref 101–111)
Calcium, Ion: 1.13 mmol/L — ABNORMAL LOW (ref 1.15–1.40)
Creatinine, Ser: 0.8 mg/dL (ref 0.61–1.24)
GLUCOSE: 108 mg/dL — AB (ref 65–99)
HEMATOCRIT: 43 % (ref 39.0–52.0)
Hemoglobin: 14.6 g/dL (ref 13.0–17.0)
POTASSIUM: 3.6 mmol/L (ref 3.5–5.1)
Sodium: 140 mmol/L (ref 135–145)
TCO2: 26 mmol/L (ref 0–100)

## 2016-05-04 LAB — COMPREHENSIVE METABOLIC PANEL
ALK PHOS: 97 U/L (ref 38–126)
ALT: 29 U/L (ref 17–63)
AST: 35 U/L (ref 15–41)
Albumin: 3.8 g/dL (ref 3.5–5.0)
Anion gap: 8 (ref 5–15)
BILIRUBIN TOTAL: 1 mg/dL (ref 0.3–1.2)
BUN: 17 mg/dL (ref 6–20)
CALCIUM: 9 mg/dL (ref 8.9–10.3)
CO2: 26 mmol/L (ref 22–32)
CREATININE: 0.78 mg/dL (ref 0.61–1.24)
Chloride: 106 mmol/L (ref 101–111)
Glucose, Bld: 113 mg/dL — ABNORMAL HIGH (ref 65–99)
Potassium: 3.7 mmol/L (ref 3.5–5.1)
Sodium: 140 mmol/L (ref 135–145)
TOTAL PROTEIN: 6.5 g/dL (ref 6.5–8.1)

## 2016-05-04 LAB — BLOOD GAS, ARTERIAL
Acid-Base Excess: 0.1 mmol/L (ref 0.0–2.0)
Bicarbonate: 24 mmol/L (ref 20.0–28.0)
DRAWN BY: 313941
FIO2: 40
O2 Saturation: 98.9 %
PEEP: 5 cmH2O
Patient temperature: 98
RATE: 14 resp/min
VT: 510 mL
pCO2 arterial: 36.6 mmHg (ref 32.0–48.0)
pH, Arterial: 7.43 (ref 7.350–7.450)
pO2, Arterial: 125 mmHg — ABNORMAL HIGH (ref 83.0–108.0)

## 2016-05-04 LAB — URINE MICROSCOPIC-ADD ON
BACTERIA UA: NONE SEEN
Squamous Epithelial / LPF: NONE SEEN
WBC, UA: NONE SEEN WBC/hpf (ref 0–5)

## 2016-05-04 LAB — TYPE AND SCREEN
ABO/RH(D): A POS
Antibody Screen: NEGATIVE

## 2016-05-04 LAB — URINALYSIS, ROUTINE W REFLEX MICROSCOPIC
Bilirubin Urine: NEGATIVE
Glucose, UA: NEGATIVE mg/dL
Ketones, ur: NEGATIVE mg/dL
LEUKOCYTES UA: NEGATIVE
NITRITE: NEGATIVE
PROTEIN: NEGATIVE mg/dL
SPECIFIC GRAVITY, URINE: 1.027 (ref 1.005–1.030)
pH: 5.5 (ref 5.0–8.0)

## 2016-05-04 LAB — APTT: aPTT: 35 seconds (ref 24–36)

## 2016-05-04 LAB — PROTIME-INR
INR: 2.45
Prothrombin Time: 27 seconds — ABNORMAL HIGH (ref 11.4–15.2)

## 2016-05-04 LAB — ABO/RH: ABO/RH(D): A POS

## 2016-05-04 LAB — MRSA PCR SCREENING: MRSA by PCR: NEGATIVE

## 2016-05-04 SURGERY — RADIOLOGY WITH ANESTHESIA
Anesthesia: Choice

## 2016-05-04 MED ORDER — SODIUM CHLORIDE 0.9 % IV SOLN
INTRAVENOUS | Status: DC
  Administered 2016-05-04: 22:00:00 via INTRAVENOUS

## 2016-05-04 MED ORDER — FENTANYL CITRATE (PF) 100 MCG/2ML IJ SOLN: 50.0000 ug | Freq: Once | INTRAMUSCULAR | Status: DC

## 2016-05-04 MED ORDER — SODIUM CHLORIDE 0.9 % IV SOLN
INTRAVENOUS | Status: DC | PRN
Start: 2016-05-04 — End: 2016-05-04
  Administered 2016-05-04 (×2): via INTRAVENOUS

## 2016-05-04 MED ORDER — ONDANSETRON HCL 4 MG/2ML IJ SOLN
INTRAMUSCULAR | Status: DC | PRN
  Administered 2016-05-04: 4 mg via INTRAVENOUS

## 2016-05-04 MED ORDER — SODIUM CHLORIDE 0.9 % IJ SOLN
INTRAVENOUS | Status: DC | PRN
  Administered 2016-05-04: 25 ug via INTRA_ARTERIAL

## 2016-05-04 MED ORDER — VANCOMYCIN HCL 1000 MG IV SOLR
1000.0000 mg | INTRAVENOUS | Status: AC
  Administered 2016-05-04: 1000 mg

## 2016-05-04 MED ORDER — MIDAZOLAM HCL 2 MG/2ML IJ SOLN: 1.0000 mg | INTRAMUSCULAR | Status: DC | PRN

## 2016-05-04 MED ORDER — ACETAMINOPHEN 500 MG PO TABS: 1000.0000 mg | ORAL_TABLET | Freq: Four times a day (QID) | ORAL | Status: DC | PRN

## 2016-05-04 MED ORDER — PANTOPRAZOLE SODIUM 40 MG PO PACK
40.0000 mg | PACK | Freq: Every day | ORAL | Status: DC
Start: 2016-05-04 — End: 2016-05-05
  Filled 2016-05-04: qty 20

## 2016-05-04 MED ORDER — ORAL CARE MOUTH RINSE
15.0000 mL | OROMUCOSAL | Status: DC
  Administered 2016-05-04 – 2016-05-07 (×21): 15 mL via OROMUCOSAL

## 2016-05-04 MED ORDER — EPTIFIBATIDE 20 MG/10ML IV SOLN
INTRAVENOUS | Status: AC
  Filled 2016-05-04: qty 10

## 2016-05-04 MED ORDER — IOPAMIDOL (ISOVUE-300) INJECTION 61%
INTRAVENOUS | Status: AC
  Administered 2016-05-04: 75 mL
  Filled 2016-05-04: qty 300

## 2016-05-04 MED ORDER — SODIUM CHLORIDE 0.9 % IV SOLN
Freq: Once | INTRAVENOUS | Status: AC
  Administered 2016-05-04: 11:00:00 via INTRAVENOUS

## 2016-05-04 MED ORDER — IOPAMIDOL (ISOVUE-370) INJECTION 76%
INTRAVENOUS | Status: AC
  Administered 2016-05-04: 100 mL
  Filled 2016-05-04: qty 100

## 2016-05-04 MED ORDER — PROPOFOL 10 MG/ML IV BOLUS
INTRAVENOUS | Status: DC | PRN
  Administered 2016-05-04: 130 mg via INTRAVENOUS

## 2016-05-04 MED ORDER — NICARDIPINE HCL IN NACL 20-0.86 MG/200ML-% IV SOLN: 5.0000 mg/h | INTRAVENOUS | Status: DC

## 2016-05-04 MED ORDER — FENTANYL BOLUS VIA INFUSION
25.0000 ug | INTRAVENOUS | Status: DC | PRN
  Filled 2016-05-04: qty 25

## 2016-05-04 MED ORDER — SODIUM CHLORIDE 0.9 % IV SOLN
25.0000 ug/h | INTRAVENOUS | Status: DC
  Administered 2016-05-04: 50 ug/h via INTRAVENOUS
  Filled 2016-05-04: qty 50

## 2016-05-04 MED ORDER — ONDANSETRON HCL 4 MG/2ML IJ SOLN: 4.0000 mg | Freq: Four times a day (QID) | INTRAMUSCULAR | Status: DC | PRN

## 2016-05-04 MED ORDER — SUCCINYLCHOLINE CHLORIDE 200 MG/10ML IV SOSY
PREFILLED_SYRINGE | INTRAVENOUS | Status: DC | PRN
  Administered 2016-05-04: 80 mg via INTRAVENOUS

## 2016-05-04 MED ORDER — VANCOMYCIN HCL IN DEXTROSE 1-5 GM/200ML-% IV SOLN
INTRAVENOUS | Status: AC
  Filled 2016-05-04: qty 200

## 2016-05-04 MED ORDER — CHLORHEXIDINE GLUCONATE 0.12% ORAL RINSE (MEDLINE KIT)
15.0000 mL | Freq: Two times a day (BID) | OROMUCOSAL | Status: DC
  Administered 2016-05-04 – 2016-05-07 (×6): 15 mL via OROMUCOSAL

## 2016-05-04 MED ORDER — LIDOCAINE HCL (CARDIAC) 20 MG/ML IV SOLN
INTRAVENOUS | Status: DC | PRN
  Administered 2016-05-04: 100 mg via INTRAVENOUS

## 2016-05-04 MED ORDER — ONDANSETRON HCL 4 MG/2ML IJ SOLN
4.0000 mg | Freq: Once | INTRAMUSCULAR | Status: DC | PRN
Start: 2016-05-04 — End: 2016-05-08

## 2016-05-04 MED ORDER — DEXTROSE 5 % IV SOLN
INTRAVENOUS | Status: DC | PRN
  Administered 2016-05-04: 13:00:00 via INTRAVENOUS

## 2016-05-04 MED ORDER — HYDROMORPHONE HCL 1 MG/ML IJ SOLN: 0.5000 mg | INTRAMUSCULAR | Status: DC | PRN

## 2016-05-04 MED ORDER — PHENYLEPHRINE HCL 10 MG/ML IJ SOLN
INTRAVENOUS | Status: DC | PRN
  Administered 2016-05-04: 40 ug/min via INTRAVENOUS

## 2016-05-04 MED ORDER — SODIUM CHLORIDE 0.9 % IV SOLN: INTRAVENOUS | Status: DC

## 2016-05-04 MED ORDER — STROKE: EARLY STAGES OF RECOVERY BOOK
Freq: Once | Status: DC
  Filled 2016-05-04: qty 1

## 2016-05-04 MED ORDER — SODIUM CHLORIDE 0.9 % IV SOLN
1.0000 g | Freq: Once | INTRAVENOUS | Status: AC
  Administered 2016-05-04: 1 g via INTRAVENOUS
  Filled 2016-05-04: qty 10

## 2016-05-04 MED ORDER — FENTANYL CITRATE (PF) 100 MCG/2ML IJ SOLN
INTRAMUSCULAR | Status: DC | PRN
  Administered 2016-05-04: 100 ug via INTRAVENOUS

## 2016-05-04 MED ORDER — ROCURONIUM BROMIDE 100 MG/10ML IV SOLN
INTRAVENOUS | Status: DC | PRN
  Administered 2016-05-04: 50 mg via INTRAVENOUS
  Administered 2016-05-04: 30 mg via INTRAVENOUS

## 2016-05-04 MED ORDER — EPTIFIBATIDE 20 MG/10ML IV SOLN
INTRAVENOUS | Status: DC | PRN
  Administered 2016-05-04 (×2): 1.8 mg via INTRAVENOUS

## 2016-05-04 MED ORDER — ACETAMINOPHEN 650 MG RE SUPP: 650.0000 mg | Freq: Four times a day (QID) | RECTAL | Status: DC | PRN

## 2016-05-04 MED ORDER — SENNOSIDES-DOCUSATE SODIUM 8.6-50 MG PO TABS
1.0000 | ORAL_TABLET | Freq: Every evening | ORAL | Status: DC | PRN
Start: 2016-05-04 — End: 2016-05-10
  Filled 2016-05-04: qty 1

## 2016-05-04 NOTE — ED Provider Notes (Signed)
[facility] DEPT Provider Note   CSN: [id number] Arrival date & time: 05/04/16  [id number]     History   Chief Complaint Chief Complaint  Patient presents with  . Fall  . Altered Mental Status    HPI NEREO SHOBERT is a 82 y.o. male.  HPI  Level 5 caveat due to acuity of situation and changes in mental status 82 year old male who presents with left-sided weakness. He has a history of atrial flutter on Coumadin, prior TIA, hypertension. Limited history provided by patient, and history also provided by EMS. States that he was last seen in his normal self before bedtime at 10 PM yesterday evening. Patient states that this morning he had a mechanical fall, slipping while standing up. He does not think he hit his head. Found by family and EMS called at 8 AM this morning. They noticed that he had facial droop on the left side as well as neglect of the left side. Patient denied that he had any weakness or numbness although this was notable on EMS exam. He currently denies any headache, neck pain, back pain, chest pain or abdominal pain.  Past Medical History:  Diagnosis Date  . Anemia   . Atrial flutter ([place])   . BPH (benign prostatic hypertrophy)   . Epistaxis   . Hemorrhage of gastrointestinal tract, unspecified   . Hypertension   . Osteoarthrosis, unspecified whether generalized or localized, unspecified site   . Personal history of venous thrombosis and embolism   . Rosacea   . TIA (transient ischemic attack)     Patient Active Problem List   Diagnosis Date Noted  . Scoliosis (and kyphoscoliosis), idiopathic 01/26/2014  . ROTATOR CUFF SYNDROME, LEFT 03/07/2010  . OTH MALIG NEOPLASM SKIN OTH&UNSPEC PARTS FACE 12/06/2009  . ACTINIC KERATOSIS, HEAD 09/06/2009  . Osteoarthritis 02/20/2009  . SYNCOPE, HX OF 02/20/2009  . MITRAL VALVE REPLACEMENT, HX OF 02/20/2009  . HIP REPLACEMENT, TOTAL, HX OF 02/20/2009  . APPENDECTOMY, HX OF 02/20/2009  . HERNIORRHAPHY, HX OF 02/20/2009  .  EPISTAXIS, RECURRENT 11/19/2008  . UNS ADVRS EFF UNS RX MEDICINAL&BIOLOGICAL SBSTNC 06/12/2008  . OTHER AND UNSPECIFIED MITRAL VALVE DISEASES 03/12/2008  . ATRIAL FLUTTER 03/12/2008  . Essential hypertension 03/06/2007  . PEPTIC ULCER DISEASE 03/06/2007  . BENIGN PROSTATIC HYPERTROPHY 03/06/2007  . Coronary atherosclerosis 02/13/2007  . ROSACEA 02/13/2007  . TRANSIENT ISCHEMIC ATTACK, HX OF 02/13/2007  . DVT, HX OF 02/13/2007    Past Surgical History:  Procedure Laterality Date  . APPENDECTOMY    . HERNIA REPAIR    . JOINT REPLACEMENT    . MITRAL VALVE REPAIR    . mvp repair    . TOTAL HIP ARTHROPLASTY         Home Medications    Prior to Admission medications   Medication Sig Start Date End Date Taking? Authorizing Provider  clindamycin (CLEOCIN) 150 MG capsule TAKE 2 CAPSULES BY MOUTH PRIOR TO DENTAL WORK AS DIRECTED 11/10/15  Yes Marletta Lor, MD  warfarin (COUMADIN) 5 MG tablet Take 1 tablet by mouth daily or as directed. 03/22/16  Yes Marletta Lor, MD  ALPRAZolam Duanne Moron) 0.25 MG tablet Take 1 tablet (0.25 mg total) by mouth at bedtime. 03/22/16   Marletta Lor, MD  benazepril-hydrochlorthiazide (LOTENSIN HCT) 5-6.25 MG tablet Take 1 tablet by mouth daily. 03/22/16   Marletta Lor, MD  doxazosin (CARDURA) 1 MG tablet TAKE 1 TABLET BY MOUTH AT BEDTIME 03/22/16   Marletta Lor, MD  doxycycline (VIBRA-TABS) 100 MG tablet TAKE 1 TABLET BY MOUTH TWICE DAILY 03/08/16   Marletta Lor, MD  finasteride (PROSCAR) 5 MG tablet Take 1 tablet (5 mg total) by mouth daily. 03/22/16   Marletta Lor, MD  metoprolol succinate (TOPROL-XL) 25 MG 24 hr tablet TAKE 1 TABLET BY MOUTH EVERY MORNING AND 2 TABLETS BY MOUTH EVERY EVENING 03/22/16   Marletta Lor, MD  pantoprazole (PROTONIX) 40 MG tablet Take 1 tablet (40 mg total) by mouth daily. 03/22/16   Marletta Lor, MD  PROCTOZONE-HC 2.5 % rectal cream USE RECTALLY TWICE DAILY 08/04/15   Marletta Lor, MD  saw palmetto 160 MG capsule Take 1 capsule (160 mg total) by mouth 2 (two) times daily. 01/26/14   Ricard Dillon, MD    Family History Family History  Problem Relation Age of Onset  . Mitral valve prolapse Mother   . Coronary artery disease Father     Social History Social History  Substance Use Topics  . Smoking status: Former Research scientist (life sciences)  . Smokeless tobacco: Not on file     Comment: quit 50 yr ago  . Alcohol use No     Allergies   Novocain [procaine hcl] and Penicillins   Review of Systems Review of Systems  Constitutional: Negative for fever.  Respiratory: Negative for shortness of breath.   Cardiovascular: Negative for chest pain.  Gastrointestinal: Negative for abdominal pain.  Musculoskeletal: Negative for back pain.  Hematological: Bruises/bleeds easily.  All other systems reviewed and are negative.     Physical Exam Updated Vital Signs BP 112/85   Pulse 109   Resp 17   SpO2 99%   Physical Exam  Physical Exam  Nursing note and vitals reviewed. Constitutional: Well developed, well nourished, non-toxic, and in no acute distress Head: Normocephalic and atraumatic.  Mouth/Throat: Oropharynx is clear and moist.  Eyes: PERRL Neck: Normal range of motion. Neck supple. in makeshift cervical collar Cardiovascular: Normal rate and regular rhythm.   Pulmonary/Chest: Effort normal and breath sounds normal. no chest wall tendernss Abdominal: Soft. There is no tenderness. There is no rebound and no guarding.  Musculoskeletal: No deformities or bruising.  Neurological: Alert, oriented to person, time, place, left facial droop, slight slurring of speech, 4/5 weakness in LUE and LLE against gravity, 5/5 in RUE and RLE. Sensation to light touch lost in LUE and LLE Skin: Skin is warm and dry.  Psychiatric: Cooperative  ED Treatments / Results  Labs (all labs ordered are listed, but only abnormal results are displayed) Labs Reviewed  PROTIME-INR -  Abnormal; Notable for the following:       Result Value   Prothrombin Time 27.0 (*)    All other components within normal limits  CBC - Abnormal; Notable for the following:    Platelets 141 (*)    All other components within normal limits  DIFFERENTIAL - Abnormal; Notable for the following:    Neutro Abs 7.9 (*)    All other components within normal limits  COMPREHENSIVE METABOLIC PANEL - Abnormal; Notable for the following:    Glucose, Bld 113 (*)    All other components within normal limits  I-STAT CHEM 8, ED - Abnormal; Notable for the following:    Glucose, Bld 108 (*)    Calcium, Ion 1.13 (*)    All other components within normal limits  APTT  URINALYSIS, ROUTINE W REFLEX MICROSCOPIC (NOT AT [facility])  I-STAT TROPOININ, ED  TYPE AND SCREEN  ABO/RH  EKG  EKG Interpretation  Date/Time:  Thursday May 04 2016 08:47:02 EDT Ventricular Rate:  62 PR Interval:    QRS Duration: 113 QT Interval:  434 QTC Calculation: 441 R Axis:   -51 Text Interpretation:  Junctional rhythm Borderline IVCD with LAD Inferior infarct, age indeterminate Anterior infarct, old Confirmed by Yehonatan Grandison MD, Maryfer Tauzin [phone]) on 05/04/2016 10:07:27 AM       Radiology Ct Angio Head W Or Wo Contrast  Result Date: 05/04/2016 CLINICAL DATA:  82 year old male with left-sided weakness and facial droop. Subsequent encounter. EXAM: CT ANGIOGRAPHY HEAD AND NECK.  CT PERFUSION (RAPID). TECHNIQUE: Multidetector CT imaging of the head and neck was performed using the standard protocol during bolus administration of intravenous contrast. Multiplanar CT image reconstructions and MIPs were obtained to evaluate the vascular anatomy. Carotid stenosis measurements (when applicable) are obtained utilizing NASCET criteria, using the distal internal carotid diameter as the denominator. CONTRAST:  100 cc Omnipaque 370. COMPARISON:  05/04/2016 CT head and cervical spine CT. FINDINGS: CT HEAD RAPID. PERFUSION: Large right middle cerebral  artery distribution decreased profusion with small areas of possible infarction. RAPID findings with T-max greater than 6 seconds of 177 cc and CBF less than 30% of 0 cc. Overall mismatch volume of 177 cc. Brain: Large right middle cerebral artery distribution region of ischemia. No intracranial hemorrhage. No intracranial mass. Calvarium and skull base: No acute abnormality. Paranasal sinuses: Partial opacification left maxillary sinus and mucosal thickening/partial opacification ethmoid sinus air cells bilaterally. Orbits: No acute abnormality. CTA NECK Aortic arch: 3 vessel arch. Plaque right innominate artery origin without significant stenosis. Minimal plaque left subclavian artery origin. Right carotid system: Plaque right carotid bifurcation without significant stenosis. Ectatic distal cervical segment. Decreased flow proximal to the skullbase. Left carotid system: Plaque left carotid bifurcation without hemodynamically significant stenosis. Ectatic distal cervical segment. Vertebral arteries:Mild narrowing origin left vertebral artery. Right vertebral artery slightly dominant in size. Skeleton: Cervical spondylotic changes. Other neck: No worrisome neck mass. Upper chest: No worrisome lung apical mass. CTA HEAD Anterior circulation: Decreased flow within the right internal carotid artery cavernous segment. This is most likely related to thrombus in the right carotid terminus with poor flow to right middle cerebral artery branches (collateral flow noted along the periphery of the right middle cerebral artery distal branches). No significant stenosis left internal carotid artery, left middle cerebral artery or left anterior cerebral artery. Posterior circulation: No significant stenosis vertebral arteries or basilar artery. Venous sinuses: Patent Anatomic variants: Negative. Delayed phase: As above. IMPRESSION: Thrombus with occlusion of the right carotid terminus with poor flow to the right middle cerebral  artery branches (collateral flow to distal right middle cerebral artery branches). This is causing decreased caliber of the right internal carotid artery petrous and cavernous segment without high-grade proximal stenosis. Large area of ischemia involving the right middle cerebral artery distribution. Findings suggest significant reversible component. Images reviewed with Dr. Leonel Ramsay at times imaging. Electronically Signed   By: Genia Del M.D.   On: 05/04/2016 10:51   Ct Head Wo Contrast  Result Date: 05/04/2016 CLINICAL DATA:  Pt was found down on floor, pt with left sided neglect and facial droop, pt was last seen normal at 2200 last night EXAM: CT HEAD WITHOUT CONTRAST CT CERVICAL SPINE WITHOUT CONTRAST TECHNIQUE: Multidetector CT imaging of the head and cervical spine was performed following the standard protocol without intravenous contrast. Multiplanar CT image reconstructions of the cervical spine were also generated. COMPARISON:  None. FINDINGS: CT HEAD  FINDINGS Brain: The ventricles are normal in size, for this patient's age, and normal in configuration. There are no parenchymal masses or mass effect. There is subtle decreased attenuation along the right insular cortex. No other parenchymal evidence of a recent infarct. No extra-axial masses or abnormal fluid collections. No intracranial hemorrhage. Vascular: There is a dense M1 segment of the right middle cerebral artery concerning for thrombus. Skull: No skull fracture or lesion. Sinuses/Orbits: Mild left maxillary mucosal thickening. Moderate ethmoid sinus mucosal thickening. Mild sphenoid and inferior frontal sinus mucosal thickening. Globes and orbits are unremarkable other than changes from cataract surgery. Other: Clear mastoid air cells. CT CERVICAL SPINE FINDINGS Alignment: Normal. Skull base and vertebrae: Skullbase alignment with the cervical spine is normal. No fractures. No bone lesions. Bones are diffusely demineralized. Soft  tissues and spinal canal: No soft tissue masses or adenopathy. There are carotid artery vascular calcifications. Disc levels: There is loss of disc height with endplate sclerosis and spurring at all levels ranging from moderate to severe, greatest at C7-T1. Uncovertebral spurring leads to varying degrees of neural foraminal narrowing, which appears greatest on the left at C4-C5, moderate severity. Upper chest: Lung apices are clear. Other: None IMPRESSION: HEAD CT: 1. Hyperdense right middle cerebral artery sign accompanied by subtle decreased attenuation along the right insular cortex. This suggests a recent right middle cerebral artery infarct due to an M1 segment embolus/thrombus. Recommend follow-up brain MRI. 2. No other acute intracranial abnormalities. No intracranial hemorrhage. CERVICAL CT: 1. No fracture, spondylolisthesis or acute finding. Electronically Signed   By: Lajean Manes M.D.   On: 05/04/2016 09:18   Ct Angio Neck W Or Wo Contrast  Result Date: 05/04/2016 CLINICAL DATA:  82 year old male with left-sided weakness and facial droop. Subsequent encounter. EXAM: CT ANGIOGRAPHY HEAD AND NECK.  CT PERFUSION (RAPID). TECHNIQUE: Multidetector CT imaging of the head and neck was performed using the standard protocol during bolus administration of intravenous contrast. Multiplanar CT image reconstructions and MIPs were obtained to evaluate the vascular anatomy. Carotid stenosis measurements (when applicable) are obtained utilizing NASCET criteria, using the distal internal carotid diameter as the denominator. CONTRAST:  100 cc Omnipaque 370. COMPARISON:  05/04/2016 CT head and cervical spine CT. FINDINGS: CT HEAD RAPID. PERFUSION: Large right middle cerebral artery distribution decreased profusion with small areas of possible infarction. RAPID findings with T-max greater than 6 seconds of 177 cc and CBF less than 30% of 0 cc. Overall mismatch volume of 177 cc. Brain: Large right middle cerebral artery  distribution region of ischemia. No intracranial hemorrhage. No intracranial mass. Calvarium and skull base: No acute abnormality. Paranasal sinuses: Partial opacification left maxillary sinus and mucosal thickening/partial opacification ethmoid sinus air cells bilaterally. Orbits: No acute abnormality. CTA NECK Aortic arch: 3 vessel arch. Plaque right innominate artery origin without significant stenosis. Minimal plaque left subclavian artery origin. Right carotid system: Plaque right carotid bifurcation without significant stenosis. Ectatic distal cervical segment. Decreased flow proximal to the skullbase. Left carotid system: Plaque left carotid bifurcation without hemodynamically significant stenosis. Ectatic distal cervical segment. Vertebral arteries:Mild narrowing origin left vertebral artery. Right vertebral artery slightly dominant in size. Skeleton: Cervical spondylotic changes. Other neck: No worrisome neck mass. Upper chest: No worrisome lung apical mass. CTA HEAD Anterior circulation: Decreased flow within the right internal carotid artery cavernous segment. This is most likely related to thrombus in the right carotid terminus with poor flow to right middle cerebral artery branches (collateral flow noted along the periphery of the right  middle cerebral artery distal branches). No significant stenosis left internal carotid artery, left middle cerebral artery or left anterior cerebral artery. Posterior circulation: No significant stenosis vertebral arteries or basilar artery. Venous sinuses: Patent Anatomic variants: Negative. Delayed phase: As above. 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 This is causing decreased caliber of the right internal carotid artery petrous and cavernous segment without high-grade proximal stenosis. Large area of ischemia  involving the right middle cerebral artery distribution. Findings suggest significant reversible component. Images reviewed with Dr. Leonel Ramsay at times imaging. Electronically Signed   By: Genia Del M.D.   On: 05/04/2016 10:51   Ct Cervical Spine Wo Contrast  Result Date: 05/04/2016 CLINICAL DATA:  Pt was found down on floor, pt with left sided neglect and facial droop, pt was last seen normal at 2200 last night EXAM: CT HEAD WITHOUT CONTRAST CT CERVICAL SPINE WITHOUT CONTRAST TECHNIQUE: Multidetector CT imaging of the head and cervical spine was performed following the standard protocol without intravenous contrast. Multiplanar CT image reconstructions of the cervical spine were also generated. COMPARISON:  None. FINDINGS: CT HEAD FINDINGS Brain: The ventricles are normal in size, for this patient's age, and normal in configuration. There are no parenchymal masses or mass effect. There is subtle decreased attenuation along the right insular cortex. No other parenchymal evidence of a recent infarct. No extra-axial masses or abnormal fluid collections. No intracranial hemorrhage. Vascular: There is a dense M1 segment of the right middle cerebral artery concerning for thrombus. Skull: No skull fracture or lesion. Sinuses/Orbits: Mild left maxillary mucosal thickening. Moderate ethmoid sinus mucosal thickening. Mild sphenoid and inferior frontal sinus mucosal thickening. Globes and orbits are unremarkable other than changes from cataract surgery. Other: Clear mastoid air cells. CT CERVICAL SPINE FINDINGS Alignment: Normal. Skull base and vertebrae: Skullbase alignment with the cervical spine is normal. No fractures. No bone lesions. Bones are diffusely demineralized. Soft tissues and spinal canal: No soft tissue masses or adenopathy. There are carotid artery vascular calcifications. Disc levels: There is loss of disc height with endplate sclerosis and spurring at all levels ranging from moderate to severe,  greatest at C7-T1. Uncovertebral spurring leads to varying degrees of neural foraminal narrowing, which appears greatest on the left at C4-C5, moderate severity. Upper chest: Lung apices are clear. Other: None IMPRESSION: HEAD CT: 1. Hyperdense right middle cerebral artery sign accompanied by subtle decreased attenuation along the right insular cortex. This suggests a recent right middle cerebral artery infarct due to an M1 segment embolus/thrombus. Recommend follow-up brain MRI. 2. No other acute intracranial abnormalities. No intracranial hemorrhage. CERVICAL CT: 1. No fracture, spondylolisthesis or acute finding. Electronically Signed   By: Lajean Manes M.D.   On: 05/04/2016 09:18   Ct Cerebral Perfusion W Contrast  Result Date: 05/04/2016 CLINICAL DATA:  82 year old male with left-sided weakness and facial droop. Subsequent encounter. EXAM: CT ANGIOGRAPHY HEAD AND NECK.  CT PERFUSION (RAPID). TECHNIQUE: Multidetector CT imaging of the head and neck was performed using the standard protocol during bolus administration of intravenous contrast. Multiplanar CT image reconstructions and MIPs were obtained to evaluate the vascular anatomy. Carotid stenosis measurements (when applicable) are obtained utilizing NASCET criteria, using the distal internal carotid diameter as the denominator. CONTRAST:  100 cc Omnipaque 370. COMPARISON:  05/04/2016 CT head and cervical spine CT. FINDINGS: CT HEAD RAPID. PERFUSION: Large right middle cerebral artery distribution  decreased profusion with small areas of possible infarction. RAPID findings with T-max greater than 6 seconds of 177 cc and CBF less than 30% of 0 cc. Overall mismatch volume of 177 cc. Brain: Large right middle cerebral artery distribution region of ischemia. No intracranial hemorrhage. No intracranial mass. Calvarium and skull base: No acute abnormality. Paranasal sinuses: Partial opacification left maxillary sinus and mucosal thickening/partial opacification  ethmoid sinus air cells bilaterally. Orbits: No acute abnormality. CTA NECK Aortic arch: 3 vessel arch. Plaque right innominate artery origin without significant stenosis. Minimal plaque left subclavian artery origin. Right carotid system: Plaque right carotid bifurcation without significant stenosis. Ectatic distal cervical segment. Decreased flow proximal to the skullbase. Left carotid system: Plaque left carotid bifurcation without hemodynamically significant stenosis. Ectatic distal cervical segment. Vertebral arteries:Mild narrowing origin left vertebral artery. Right vertebral artery slightly dominant in size. Skeleton: Cervical spondylotic changes. Other neck: No worrisome neck mass. Upper chest: No worrisome lung apical mass. CTA HEAD Anterior circulation: Decreased flow within the right internal carotid artery cavernous segment. This is most likely related to thrombus in the right carotid terminus with poor flow to right middle cerebral artery branches (collateral flow noted along the periphery of the right middle cerebral artery distal branches). No significant stenosis left internal carotid artery, left middle cerebral artery or left anterior cerebral artery. Posterior circulation: No significant stenosis vertebral arteries or basilar artery. Venous sinuses: Patent Anatomic variants: Negative. Delayed phase: As above. 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 This is causing decreased caliber of the right internal carotid artery petrous and cavernous segment without high-grade proximal stenosis. Large area of ischemia involving the right middle cerebral artery distribution. Findings suggest significant reversible component. Images reviewed with Dr. Leonel Ramsay at times imaging. Electronically Signed   By: Genia Del M.D.   On: 05/04/2016 10:51     Procedures Procedures (including critical care time)  CRITICAL CARE Performed by: Forde Dandy   Total critical care time: 45 minutes  Critical care time was exclusive of separately billable procedures and treating other patients.  Critical care was necessary to treat or prevent imminent or life-threatening deterioration.  Critical care was time spent personally by me on the following activities: development of treatment plan with patient and/or surrogate as well as nursing, discussions with consultants, evaluation of patient's response to treatment, examination of patient, obtaining history from patient or surrogate, ordering and performing treatments and interventions, ordering and review of laboratory studies, ordering and review of radiographic studies, pulse oximetry and re-evaluation of patient's condition.   Medications Ordered in ED Medications  iopamidol (ISOVUE-370) 76 % injection (100 mLs  Contrast Given 05/04/16 1008)  0.9 %  sodium chloride infusion ( Intravenous New Bag/Given 05/04/16 1037)     Initial Impression / Assessment and Plan / ED Course  I have reviewed the triage vital signs and the nursing notes.  Pertinent labs & imaging results that were available during my care of the patient were reviewed by me and considered in my medical decision making (see chart for details).  Clinical Course    Patient presenting with a mechanical fall, and on exam with hemiparesis involving the left upper and lower extremity, left facial droop, and diminished sensation over the left upper and lower extremity. Presentation concerning for an acute CVA, but he is out of the window for TPA as his last normal was  10 PM yesterday evening and now 10-11 hours out from her last normal period. CT head and cervical spine negative for trauma. There is no hemorrhagic stroke. There is concern for thrombus and right MCA infarct. Spoke with Dr. Leonel Ramsay, who felt that patient good candidate  for potential interventional radiology. Underwent CT perfusion and CTA showing significant reversible ischemia. Plan for patient to be taken to IR and admit to neurology service afterward.  Final Clinical Impressions(s) / ED Diagnoses   Final diagnoses:  Acute CVA (cerebrovascular accident) [facility])    New Prescriptions New Prescriptions   No medications on file     Forde Dandy, MD 05/04/16 1102

## 2016-05-04 NOTE — Anesthesia Preprocedure Evaluation (Signed)
Anesthesia Evaluation  Patient identified by MRN, date of birth, ID band Patient awake and Patient confused    Reviewed: Unable to perform ROS - Chart review only  Airway Mallampati: I       Dental   Pulmonary former smoker,     + decreased breath sounds      Cardiovascular hypertension, + CAD  + dysrhythmias Atrial Fibrillation + Valvular Problems/Murmurs MR  Rhythm:Irregular Rate:Abnormal     Neuro/Psych TIACVA    GI/Hepatic PUD,   Endo/Other    Renal/GU      Musculoskeletal  (+) Arthritis ,   Abdominal   Peds  Hematology  (+) anemia ,   Anesthesia Other Findings   Reproductive/Obstetrics                             Anesthesia Physical Anesthesia Plan  ASA: IV  Anesthesia Plan: General   Post-op Pain Management:    Induction: Intravenous  Airway Management Planned: Oral ETT  Additional Equipment: Arterial line  Intra-op Plan:   Post-operative Plan: Possible Post-op intubation/ventilation  Informed Consent: I have reviewed the patients History and Physical, chart, labs and discussed the procedure including the risks, benefits and alternatives for the proposed anesthesia with the patient or authorized representative who has indicated his/her understanding and acceptance.     Plan Discussed with: CRNA, Anesthesiologist and Surgeon  Anesthesia Plan Comments:         Anesthesia Quick Evaluation

## 2016-05-04 NOTE — Progress Notes (Signed)
Pt. Delivered to 3112 with CRNA. Pt. Remains intubated. R femoral groin examined with receiving RN, which is WNL. Sheath intact.

## 2016-05-04 NOTE — ED Notes (Signed)
CRNA at bedside and placed arterial line in left wrist.

## 2016-05-04 NOTE — Progress Notes (Signed)
Responded to request from nurse to visit with patient and to support Wife and Son at bedside. Per patient wife and son Patient  came to ED with stroke  Like symptoms after experiencing fall.  It was later confirmed that Mr. Winkeler had had a stroke. Doctor spoke with family and patient is going to surgery.  I remained with patient and family until and escorted  Wife and son to waiting area.  Son wants staff tTo be aware that patient is Jewish. Family asked that I pray and stay  with them. I provided ministry of presence, empathetic listening, words of encouragement. Wife and son are fearful but coping at the moment.  Will follow as needed.   05/04/16 1000  Clinical Encounter Type  Visited With Patient and family together;Health care provider  Visit Type Initial;Spiritual support;Trauma  Referral From Nurse  Spiritual Encounters  Spiritual Needs Prayer;Emotional  Stress Factors  Patient Stress Factors Health changes;Major life changes  Cristopher Peru, [facility], Pager [phone]

## 2016-05-04 NOTE — Anesthesia Postprocedure Evaluation (Signed)
Anesthesia Post Note  Patient: Brian [name]  Procedure(s) Performed: Procedure(s) (LRB): RADIOLOGY WITH ANESTHESIA (N/A)  Patient location during evaluation: NICU Anesthesia Type: General Level of consciousness: patient remains intubated per anesthesia plan Pain management: pain level controlled Vital Signs Assessment: post-procedure vital signs reviewed and stable Respiratory status: patient remains intubated per anesthesia plan, patient on ventilator - see flowsheet for VS and respiratory function stable Cardiovascular status: stable Anesthetic complications: no    Last Vitals:  Vitals:   05/04/16 1145 05/04/16 1200  BP: 114/66 (!) 113/54  Pulse: 115 93  Resp: 17 16    Last Pain: There were no vitals filed for this visit.               Gillie Fleites EDWARD

## 2016-05-04 NOTE — H&P (Addendum)
H&P    Chief Complaint: Code stroke  History obtained from:  family  HPI:                                                                                                                                         Brian [name] is an 82 y.o. male with past medical history of TIA, hypertension, atrial flutter. Patient was brought to the emergency department as code stroke after he was found down in the kitchen with left-sided hemiparesis. Patient's last known normal was 6:15 PM the prior night. Per son as 6:15 this morning patient called out to him and he was found on the floor not moving his left side. Patient was brought in for immediate CT scan which showed a right hyperdense MCA. At that time patient was considered to be a possible wake up stroke. Patient was brought for immediate CT angiogram and perfusion. CT perfusion showed significant amount of viable tissue. For this reason he was brought to intervention radiology.  Date last known well: Date: 05/03/2016 Time last known well: Time: 18:15 tPA Given: No: out of window   Past Medical History:  Diagnosis Date  . Anemia   . Atrial flutter ([place])   . BPH (benign prostatic hypertrophy)   . Epistaxis   . Hemorrhage of gastrointestinal tract, unspecified   . Hypertension   . Osteoarthrosis, unspecified whether generalized or localized, unspecified site   . Personal history of venous thrombosis and embolism   . Rosacea   . TIA (transient ischemic attack)     Past Surgical History:  Procedure Laterality Date  . APPENDECTOMY    . HERNIA REPAIR    . JOINT REPLACEMENT    . MITRAL VALVE REPAIR    . mvp repair    . TOTAL HIP ARTHROPLASTY      Family History  Problem Relation Age of Onset  . Mitral valve prolapse Mother   . Coronary artery disease Father    Social History:  reports that he has quit smoking. He does not have any smokeless tobacco history on file. He reports that he does not drink alcohol or use  drugs.  Allergies:  Allergies  Allergen Reactions  . Novocain [Procaine Hcl]     Irregular heart beat     . Penicillins     REACTION: itching    Medications:  No current facility-administered medications for this encounter.    Current Outpatient Prescriptions  Medication Sig Dispense Refill  . clindamycin (CLEOCIN) 150 MG capsule TAKE 2 CAPSULES BY MOUTH PRIOR TO DENTAL WORK AS DIRECTED 2 capsule 2  . warfarin (COUMADIN) 5 MG tablet Take 1 tablet by mouth daily or as directed. 90 tablet 3  . ALPRAZolam (XANAX) 0.25 MG tablet Take 1 tablet (0.25 mg total) by mouth at bedtime. 90 tablet 1  . benazepril-hydrochlorthiazide (LOTENSIN HCT) 5-6.25 MG tablet Take 1 tablet by mouth daily. 90 tablet 3  . doxazosin (CARDURA) 1 MG tablet TAKE 1 TABLET BY MOUTH AT BEDTIME 90 tablet 3  . doxycycline (VIBRA-TABS) 100 MG tablet TAKE 1 TABLET BY MOUTH TWICE DAILY 180 tablet 0  . finasteride (PROSCAR) 5 MG tablet Take 1 tablet (5 mg total) by mouth daily. 90 tablet 3  . metoprolol succinate (TOPROL-XL) 25 MG 24 hr tablet TAKE 1 TABLET BY MOUTH EVERY MORNING AND 2 TABLETS BY MOUTH EVERY EVENING 270 tablet 3  . pantoprazole (PROTONIX) 40 MG tablet Take 1 tablet (40 mg total) by mouth daily. 90 tablet 3  . PROCTOZONE-HC 2.5 % rectal cream USE RECTALLY TWICE DAILY 30 g 0  . saw palmetto 160 MG capsule Take 1 capsule (160 mg total) by mouth 2 (two) times daily.       ROS:                                                                                                                                       History obtained from family  General ROS: negative for - chills, fatigue, fever, night sweats, weight gain or weight loss Psychological ROS: negative for - behavioral disorder, hallucinations, memory difficulties, mood swings or suicidal ideation Ophthalmic ROS: negative for -  blurry vision, double vision, eye pain or loss of vision ENT ROS: negative for - epistaxis, nasal discharge, oral lesions, sore throat, tinnitus or vertigo Allergy and Immunology ROS: negative for - hives or itchy/watery eyes Hematological and Lymphatic ROS: negative for - bleeding problems, bruising or swollen lymph nodes Endocrine ROS: negative for - galactorrhea, hair pattern changes, polydipsia/polyuria or temperature intolerance Respiratory ROS: negative for - cough, hemoptysis, shortness of breath or wheezing Cardiovascular ROS: negative for - chest pain, dyspnea on exertion, edema or irregular heartbeat Gastrointestinal ROS: negative for - abdominal pain, diarrhea, hematemesis, nausea/vomiting or stool incontinence Genito-Urinary ROS: negative for - dysuria, hematuria, incontinence or urinary frequency/urgency Musculoskeletal ROS: negative for - joint swelling or muscular weakness Neurological ROS: as noted in HPI Dermatological ROS: negative for rash and skin lesion changes  Neurologic Examination:  Blood pressure 127/57, pulse 64, resp. rate 19, SpO2 100 %.  HEENT-  Normocephalic, no lesions, without obvious abnormality.  Normal external eye and conjunctiva.  Normal TM's bilaterally.  Normal auditory canals and external ears. Normal external nose, mucus membranes and septum.  Normal pharynx. Cardiovascular- S1, S2 normal, pulses palpable throughout   Lungs- chest clear, no wheezing, rales, normal symmetric air entry Abdomen- normal findings: bowel sounds normal Extremities- no edema Lymph-no adenopathy palpable Musculoskeletal-no joint tenderness, deformity or swelling Skin-warm and dry, no hyperpigmentation, vitiligo, or suspicious lesions  Neurological Examination Mental Status: Alert, able to follow commands on the right side and language showed severe dysarthria but no  aphasia. Cranial Nerves: II: Vision shows a dense left hemianopsia,  pupils equal, round, reactive to light and accommodation III,IV, VI: ptosis present in the right eye, eyes are deviated to the right and does not cross midline V,VII: Significant left facial droop, facial light touch sensation decreased on the left face VIII: hearing normal bilaterally IX,X: Unable to visualize uvula XI: bilateral shoulder shrug equal XII: midline tongue extension Motor: Right arm and leg showed 5 out of 5 strength. Left upper extremity showed 4 out of 5 strength with shoulder abduction and bicep flexion however he had 0/5 with tricep extension and distal strength. Left leg he was able to lift antigravity with a 3+/5 strength Sensory: Pinprick and light touch was intact on the right but not the left arm and leg  Deep Tendon Reflexes: 2+ and symmetric throughout upper extremities and diminished in lower extremities Plantars: Right: downgoing   Left: downgoing Cerebellar: normal finger-to-nose on the right patient showed no dysmetria with the right heel to shin however he was having difficulty understanding what I wanted him to do Gait: Not tested       Lab Results: Basic Metabolic Panel:  Recent Labs Lab 05/04/16 0920 05/04/16 0934  NA 140 140  K 3.7 3.6  CL 106 101  CO2 26  --   GLUCOSE 113* 108*  BUN 17 20  CREATININE 0.78 0.80  CALCIUM 9.0  --     Liver Function Tests:  Recent Labs Lab 05/04/16 0920  AST 35  ALT 29  ALKPHOS 97  BILITOT 1.0  PROT 6.5  ALBUMIN 3.8   No results for input(s): LIPASE, AMYLASE in the last 168 hours. No results for input(s): AMMONIA in the last 168 hours.  CBC:  Recent Labs Lab 05/04/16 0920 05/04/16 0934  WBC 9.3  --   NEUTROABS 7.9*  --   HGB 13.7 14.6  HCT 41.4 43.0  MCV 90.2  --   PLT 141*  --     Cardiac Enzymes: No results for input(s): CKTOTAL, CKMB, CKMBINDEX, TROPONINI in the last 168 hours.  Lipid Panel: No results for  input(s): CHOL, TRIG, HDL, CHOLHDL, VLDL, LDLCALC in the last 168 hours.  CBG: No results for input(s): GLUCAP in the last 168 hours.  Microbiology: No results found for this or any previous visit.  Coagulation Studies:  Recent Labs  05/04/16 0920  LABPROT 27.0*  INR 2.45    Imaging: Ct Head Wo Contrast  Result Date: 05/04/2016 CLINICAL DATA:  Pt was found down on floor, pt with left sided neglect and facial droop, pt was last seen normal at 2200 last night EXAM: CT HEAD WITHOUT CONTRAST CT CERVICAL SPINE WITHOUT CONTRAST TECHNIQUE: Multidetector CT imaging of the head and cervical spine was performed following the standard protocol without intravenous contrast. Multiplanar CT image reconstructions of  the cervical spine were also generated. COMPARISON:  None. FINDINGS: CT HEAD FINDINGS Brain: The ventricles are normal in size, for this patient's age, and normal in configuration. There are no parenchymal masses or mass effect. There is subtle decreased attenuation along the right insular cortex. No other parenchymal evidence of a recent infarct. No extra-axial masses or abnormal fluid collections. No intracranial hemorrhage. Vascular: There is a dense M1 segment of the right middle cerebral artery concerning for thrombus. Skull: No skull fracture or lesion. Sinuses/Orbits: Mild left maxillary mucosal thickening. Moderate ethmoid sinus mucosal thickening. Mild sphenoid and inferior frontal sinus mucosal thickening. Globes and orbits are unremarkable other than changes from cataract surgery. Other: Clear mastoid air cells. CT CERVICAL SPINE FINDINGS Alignment: Normal. Skull base and vertebrae: Skullbase alignment with the cervical spine is normal. No fractures. No bone lesions. Bones are diffusely demineralized. Soft tissues and spinal canal: No soft tissue masses or adenopathy. There are carotid artery vascular calcifications. Disc levels: There is loss of disc height with endplate sclerosis and  spurring at all levels ranging from moderate to severe, greatest at C7-T1. Uncovertebral spurring leads to varying degrees of neural foraminal narrowing, which appears greatest on the left at C4-C5, moderate severity. Upper chest: Lung apices are clear. Other: None IMPRESSION: HEAD CT: 1. Hyperdense right middle cerebral artery sign accompanied by subtle decreased attenuation along the right insular cortex. This suggests a recent right middle cerebral artery infarct due to an M1 segment embolus/thrombus. Recommend follow-up brain MRI. 2. No other acute intracranial abnormalities. No intracranial hemorrhage. CERVICAL CT: 1. No fracture, spondylolisthesis or acute finding. Electronically Signed   By: Lajean Manes M.D.   On: 05/04/2016 09:18   Ct Cervical Spine Wo Contrast  Result Date: 05/04/2016 CLINICAL DATA:  Pt was found down on floor, pt with left sided neglect and facial droop, pt was last seen normal at 2200 last night EXAM: CT HEAD WITHOUT CONTRAST CT CERVICAL SPINE WITHOUT CONTRAST TECHNIQUE: Multidetector CT imaging of the head and cervical spine was performed following the standard protocol without intravenous contrast. Multiplanar CT image reconstructions of the cervical spine were also generated. COMPARISON:  None. FINDINGS: CT HEAD FINDINGS Brain: The ventricles are normal in size, for this patient's age, and normal in configuration. There are no parenchymal masses or mass effect. There is subtle decreased attenuation along the right insular cortex. No other parenchymal evidence of a recent infarct. No extra-axial masses or abnormal fluid collections. No intracranial hemorrhage. Vascular: There is a dense M1 segment of the right middle cerebral artery concerning for thrombus. Skull: No skull fracture or lesion. Sinuses/Orbits: Mild left maxillary mucosal thickening. Moderate ethmoid sinus mucosal thickening. Mild sphenoid and inferior frontal sinus mucosal thickening. Globes and orbits are  unremarkable other than changes from cataract surgery. Other: Clear mastoid air cells. CT CERVICAL SPINE FINDINGS Alignment: Normal. Skull base and vertebrae: Skullbase alignment with the cervical spine is normal. No fractures. No bone lesions. Bones are diffusely demineralized. Soft tissues and spinal canal: No soft tissue masses or adenopathy. There are carotid artery vascular calcifications. Disc levels: There is loss of disc height with endplate sclerosis and spurring at all levels ranging from moderate to severe, greatest at C7-T1. Uncovertebral spurring leads to varying degrees of neural foraminal narrowing, which appears greatest on the left at C4-C5, moderate severity. Upper chest: Lung apices are clear. Other: None IMPRESSION: HEAD CT: 1. Hyperdense right middle cerebral artery sign accompanied by subtle decreased attenuation along the right insular cortex. This suggests  a recent right middle cerebral artery infarct due to an M1 segment embolus/thrombus. Recommend follow-up brain MRI. 2. No other acute intracranial abnormalities. No intracranial hemorrhage. CERVICAL CT: 1. No fracture, spondylolisthesis or acute finding. Electronically Signed   By: Lajean Manes M.D.   On: 05/04/2016 09:18       Assessment and plan discussed with with attending physician and they are in agreement.    Bruin Paragas PA-C [facility] [phone]  05/04/2016, 10:46 AM   Assessment: 82 y.o. male with a history of atrial flutter on Coumadin who presents with large right MCA embolus. Given that it was a wake up stroke, he was taken for CT perfusion which looked amenable to IR intervention. He was therefore taken for mechanical thrombectomy.  1. HgbA1c, fasting lipid panel 2. MRI brain 3. Frequent neuro checks 4. Echocardiogram 5. Prophylactic therapy-therapeutic on Coumadin, will hold Coumadin for tonight 6. Risk factor modification 7. Telemetry monitoring 8. PT consult, OT consult, Speech  consult 10. please page stroke NP  Or  PA  Or MD  from 8am -4 pm starting 9/15 as this patient will be followed by the stroke team at this point.   You can look them up on [email or web address]     This patient is critically ill and at significant risk of neurological worsening, death and care requires constant monitoring of vital signs, hemodynamics,respiratory and cardiac monitoring, neurological assessment, discussion with family, other specialists and medical decision making of high complexity. I spent 50 minutes of neurocritical care time  in the care of  this patient.  Roland Rack, MD [facility] [phone]  If 7pm- 7am, please page neurology on call as listed in [place]. 05/04/2016  4:16 PM

## 2016-05-04 NOTE — ED Notes (Signed)
Brian [name] [phone]

## 2016-05-04 NOTE — Transfer of Care (Signed)
Immediate Anesthesia Transfer of Care Note  Patient: Brian [name]  Procedure(s) Performed: Procedure(s): RADIOLOGY WITH ANESTHESIA (N/A)  Patient Location: NICU  Anesthesia Type:General  Level of Consciousness: Patient remains intubated per anesthesia plan  Airway & Oxygen Therapy: Patient remains intubated per anesthesia plan and Patient placed on Ventilator (see vital sign flow sheet for setting)  Post-op Assessment: Report given to RN and Post -op Vital signs reviewed and stable  Post vital signs: Reviewed and stable  Last Vitals:  Vitals:   05/04/16 1145 05/04/16 1200  BP: 114/66 (!) 113/54  Pulse: 115 93  Resp: 17 16    Last Pain: There were no vitals filed for this visit.       Complications: No apparent anesthesia complications

## 2016-05-04 NOTE — ED Triage Notes (Signed)
Per EMS: pt from home found on floor; pt altered with left sided deficits; LSN last night at 2200; pt with left sided neglect and facial droop; pt talking at present but unable to follow some commands

## 2016-05-04 NOTE — Anesthesia Procedure Notes (Signed)
Procedure Name: Intubation Date/Time: 05/04/2016 12:28 PM Performed by: Carney Living Pre-anesthesia Checklist: Patient identified, Suction available, Emergency Drugs available, Patient being monitored and Timeout performed Patient Re-evaluated:Patient Re-evaluated prior to inductionOxygen Delivery Method: Circle system utilized Preoxygenation: Pre-oxygenation with 100% oxygen Intubation Type: IV induction Laryngoscope Size: Mac and 4 Grade View: Grade I Tube type: Oral Tube size: 8.0 mm Number of attempts: 1 Airway Equipment and Method: Stylet Placement Confirmation: ETT inserted through vocal cords under direct vision,  positive ETCO2 and breath sounds checked- equal and bilateral Secured at: 23 cm Tube secured with: Tape Dental Injury: Teeth and Oropharynx as per pre-operative assessment

## 2016-05-04 NOTE — Procedures (Signed)
Rt common carotid arteriogram,followed by complete angiographic revascularization of occluded RT MCA M1 seg using x1 pass with solitaire 64mmx 40 mm retrieval device and 3.6 mg of superselective IA integrelin achieving a TICI 2b/TICI 3 reperfusion

## 2016-05-04 NOTE — Consult Note (Signed)
PULMONARY / CRITICAL CARE MEDICINE   Name: Brian [name] MRN: [id number] DOB: 01-25-1933    ADMISSION DATE:  05/04/2016 CONSULTATION DATE:  05/04/16  REFERRING MD:  Oleta Mouse - EDP  CHIEF COMPLAINT:  Code Stroke  HISTORY OF PRESENT ILLNESS:  Pt is encephelopathic; therefore, this HPI is obtained from chart review. Brian [name] is a 82 y.o. male with PMH as outlined below. He was brought to [facility] ED 9/14 as code stroke after being found down in kitchen with left sided hemiparesis.  He was last seen normal over 12 hours earlier, around 6:15 PM the evening prior.  Per pt's son, at roughly 6:15 AM on day of presentation, pt called out for son and when son came out, he found him on the floor unable to move left side.  In ED, he had CT of the head which demonstrated hyperdense right MCA.  CT angiogram demonstrated significant amount of viable tissue.  He was subsequently taken to IR for neuro intervention.  Post procedure, he remained on the vent and PCCM was asked to assist with vent management.  PAST MEDICAL HISTORY :  He  has a past medical history of Anemia; Atrial flutter ([place]); BPH (benign prostatic hypertrophy); Epistaxis; Hemorrhage of gastrointestinal tract, unspecified; Hypertension; Osteoarthrosis, unspecified whether generalized or localized, unspecified site; Personal history of venous thrombosis and embolism; Rosacea; and TIA (transient ischemic attack).  PAST SURGICAL HISTORY: He  has a past surgical history that includes Appendectomy; Hernia repair; Total hip arthroplasty; Joint replacement; mvp repair; and Mitral valve repair.  Allergies  Allergen Reactions  . Novocain [Procaine Hcl]     Irregular heart beat     . Penicillins     REACTION: itching    No current facility-administered medications on file prior to encounter.    Current Outpatient Prescriptions on File Prior to Encounter  Medication Sig  . ALPRAZolam (XANAX) 0.25 MG tablet Take 1 tablet (0.25 mg total) by mouth  at bedtime.  . benazepril-hydrochlorthiazide (LOTENSIN HCT) 5-6.25 MG tablet Take 1 tablet by mouth daily.  . clindamycin (CLEOCIN) 150 MG capsule TAKE 2 CAPSULES BY MOUTH PRIOR TO DENTAL WORK AS DIRECTED  . doxazosin (CARDURA) 1 MG tablet TAKE 1 TABLET BY MOUTH AT BEDTIME  . doxycycline (VIBRA-TABS) 100 MG tablet TAKE 1 TABLET BY MOUTH TWICE DAILY  . finasteride (PROSCAR) 5 MG tablet Take 1 tablet (5 mg total) by mouth daily.  . metoprolol succinate (TOPROL-XL) 25 MG 24 hr tablet TAKE 1 TABLET BY MOUTH EVERY MORNING AND 2 TABLETS BY MOUTH EVERY EVENING  . pantoprazole (PROTONIX) 40 MG tablet Take 1 tablet (40 mg total) by mouth daily.  Marland Kitchen PROCTOZONE-HC 2.5 % rectal cream USE RECTALLY TWICE DAILY  . saw palmetto 160 MG capsule Take 1 capsule (160 mg total) by mouth 2 (two) times daily.  Marland Kitchen warfarin (COUMADIN) 5 MG tablet Take 1 tablet by mouth daily or as directed.    FAMILY HISTORY:  His indicated that his mother is deceased. He indicated that his father is deceased.    SOCIAL HISTORY: He  reports that he has quit smoking. He does not have any smokeless tobacco history on file. He reports that he does not drink alcohol or use drugs.  REVIEW OF SYSTEMS:   Unable to obtain as pt is encephalopathic.  SUBJECTIVE:  On vent, unresponsive.  VITAL SIGNS: BP 110/73   Pulse 106   Resp 16   SpO2 100%   HEMODYNAMICS:    VENTILATOR SETTINGS:  INTAKE / OUTPUT: No intake/output data recorded.   PHYSICAL EXAMINATION: General: Adult male, resting in bed, in NAD. Neuro: Sedated, not responsive. HEENT: [place]/AT. PERRL, sclerae anicteric. Cardiovascular: IRIR, no M/R/G.  Lungs: Respirations even and unlabored.  CTA bilaterally, No W/R/R.  Abdomen: BS x 4, soft, NT/ND.  Musculoskeletal: No gross deformities, no edema.  Skin: Intact, warm, no rashes.     LABS:  BMET  Recent Labs Lab 05/04/16 0920 05/04/16 0934  NA 140 140  K 3.7 3.6  CL 106 101  CO2 26  --   BUN 17 20   CREATININE 0.78 0.80  GLUCOSE 113* 108*    Electrolytes  Recent Labs Lab 05/04/16 0920  CALCIUM 9.0    CBC  Recent Labs Lab 05/04/16 0920 05/04/16 0934  WBC 9.3  --   HGB 13.7 14.6  HCT 41.4 43.0  PLT 141*  --     Coag's  Recent Labs Lab 04/28/16 05/04/16 0920  APTT  --  35  INR 2.8 2.45    Sepsis Markers No results for input(s): LATICACIDVEN, PROCALCITON, O2SATVEN in the last 168 hours.  ABG No results for input(s): PHART, PCO2ART, PO2ART in the last 168 hours.  Liver Enzymes  Recent Labs Lab 05/04/16 0920  AST 35  ALT 29  ALKPHOS 97  BILITOT 1.0  ALBUMIN 3.8    Cardiac Enzymes No results for input(s): TROPONINI, PROBNP in the last 168 hours.  Glucose No results for input(s): GLUCAP in the last 168 hours.  Imaging Ct Angio Head W Or Wo Contrast  Result Date: 05/04/2016 CLINICAL DATA:  82 year old male with left-sided weakness and facial droop. Subsequent encounter. EXAM: CT ANGIOGRAPHY HEAD AND NECK.  CT PERFUSION (RAPID). TECHNIQUE: Multidetector CT imaging of the head and neck was performed using the standard protocol during bolus administration of intravenous contrast. Multiplanar CT image reconstructions and MIPs were obtained to evaluate the vascular anatomy. Carotid stenosis measurements (when applicable) are obtained utilizing NASCET criteria, using the distal internal carotid diameter as the denominator. CONTRAST:  100 cc Omnipaque 370. COMPARISON:  05/04/2016 CT head and cervical spine CT. FINDINGS: CT HEAD RAPID. PERFUSION: Large right middle cerebral artery distribution decreased profusion with small areas of possible infarction. RAPID findings with T-max greater than 6 seconds of 177 cc and CBF less than 30% of 0 cc. Overall mismatch volume of 177 cc. Brain: Large right middle cerebral artery distribution region of ischemia. No intracranial hemorrhage. No intracranial mass. Calvarium and skull base: No acute abnormality. Paranasal sinuses:  Partial opacification left maxillary sinus and mucosal thickening/partial opacification ethmoid sinus air cells bilaterally. Orbits: No acute abnormality. CTA NECK Aortic arch: 3 vessel arch. Plaque right innominate artery origin without significant stenosis. Minimal plaque left subclavian artery origin. Right carotid system: Plaque right carotid bifurcation without significant stenosis. Ectatic distal cervical segment. Decreased flow proximal to the skullbase. Left carotid system: Plaque left carotid bifurcation without hemodynamically significant stenosis. Ectatic distal cervical segment. Vertebral arteries:Mild narrowing origin left vertebral artery. Right vertebral artery slightly dominant in size. Skeleton: Cervical spondylotic changes. Other neck: No worrisome neck mass. Upper chest: No worrisome lung apical mass. CTA HEAD Anterior circulation: Decreased flow within the right internal carotid artery cavernous segment. This is most likely related to thrombus in the right carotid terminus with poor flow to right middle cerebral artery branches (collateral flow noted along the periphery of the right middle cerebral artery distal branches). No significant stenosis left internal carotid artery, left middle cerebral artery or left anterior  cerebral artery. Posterior circulation: No significant stenosis vertebral arteries or basilar artery. Venous sinuses: Patent Anatomic variants: Negative. Delayed phase: As above. 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 This is causing decreased caliber of the right internal carotid artery petrous and cavernous segment without high-grade proximal stenosis. Large area of ischemia involving the right middle cerebral artery distribution. Findings suggest significant reversible component. Images reviewed with Dr. Leonel Ramsay at times  imaging. Electronically Signed   By: Genia Del M.D.   On: 05/04/2016 10:51   Ct Head Wo Contrast  Result Date: 05/04/2016 CLINICAL DATA:  Pt was found down on floor, pt with left sided neglect and facial droop, pt was last seen normal at 2200 last night EXAM: CT HEAD WITHOUT CONTRAST CT CERVICAL SPINE WITHOUT CONTRAST TECHNIQUE: Multidetector CT imaging of the head and cervical spine was performed following the standard protocol without intravenous contrast. Multiplanar CT image reconstructions of the cervical spine were also generated. COMPARISON:  None. FINDINGS: CT HEAD FINDINGS Brain: The ventricles are normal in size, for this patient's age, and normal in configuration. There are no parenchymal masses or mass effect. There is subtle decreased attenuation along the right insular cortex. No other parenchymal evidence of a recent infarct. No extra-axial masses or abnormal fluid collections. No intracranial hemorrhage. Vascular: There is a dense M1 segment of the right middle cerebral artery concerning for thrombus. Skull: No skull fracture or lesion. Sinuses/Orbits: Mild left maxillary mucosal thickening. Moderate ethmoid sinus mucosal thickening. Mild sphenoid and inferior frontal sinus mucosal thickening. Globes and orbits are unremarkable other than changes from cataract surgery. Other: Clear mastoid air cells. CT CERVICAL SPINE FINDINGS Alignment: Normal. Skull base and vertebrae: Skullbase alignment with the cervical spine is normal. No fractures. No bone lesions. Bones are diffusely demineralized. Soft tissues and spinal canal: No soft tissue masses or adenopathy. There are carotid artery vascular calcifications. Disc levels: There is loss of disc height with endplate sclerosis and spurring at all levels ranging from moderate to severe, greatest at C7-T1. Uncovertebral spurring leads to varying degrees of neural foraminal narrowing, which appears greatest on the left at C4-C5, moderate severity.  Upper chest: Lung apices are clear. Other: None IMPRESSION: HEAD CT: 1. Hyperdense right middle cerebral artery sign accompanied by subtle decreased attenuation along the right insular cortex. This suggests a recent right middle cerebral artery infarct due to an M1 segment embolus/thrombus. Recommend follow-up brain MRI. 2. No other acute intracranial abnormalities. No intracranial hemorrhage. CERVICAL CT: 1. No fracture, spondylolisthesis or acute finding. Electronically Signed   By: Lajean Manes M.D.   On: 05/04/2016 09:18   Ct Angio Neck W Or Wo Contrast  Result Date: 05/04/2016 CLINICAL DATA:  82 year old male with left-sided weakness and facial droop. Subsequent encounter. EXAM: CT ANGIOGRAPHY HEAD AND NECK.  CT PERFUSION (RAPID). TECHNIQUE: Multidetector CT imaging of the head and neck was performed using the standard protocol during bolus administration of intravenous contrast. Multiplanar CT image reconstructions and MIPs were obtained to evaluate the vascular anatomy. Carotid stenosis measurements (when applicable) are obtained utilizing NASCET criteria, using the distal internal carotid diameter as the denominator. CONTRAST:  100 cc Omnipaque 370. COMPARISON:  05/04/2016 CT head and cervical spine CT. FINDINGS: CT HEAD RAPID. PERFUSION: Large right middle cerebral artery distribution decreased profusion with small areas of possible infarction. RAPID findings with T-max greater than 6 seconds of 177  cc and CBF less than 30% of 0 cc. Overall mismatch volume of 177 cc. Brain: Large right middle cerebral artery distribution region of ischemia. No intracranial hemorrhage. No intracranial mass. Calvarium and skull base: No acute abnormality. Paranasal sinuses: Partial opacification left maxillary sinus and mucosal thickening/partial opacification ethmoid sinus air cells bilaterally. Orbits: No acute abnormality. CTA NECK Aortic arch: 3 vessel arch. Plaque right innominate artery origin without significant  stenosis. Minimal plaque left subclavian artery origin. Right carotid system: Plaque right carotid bifurcation without significant stenosis. Ectatic distal cervical segment. Decreased flow proximal to the skullbase. Left carotid system: Plaque left carotid bifurcation without hemodynamically significant stenosis. Ectatic distal cervical segment. Vertebral arteries:Mild narrowing origin left vertebral artery. Right vertebral artery slightly dominant in size. Skeleton: Cervical spondylotic changes. Other neck: No worrisome neck mass. Upper chest: No worrisome lung apical mass. CTA HEAD Anterior circulation: Decreased flow within the right internal carotid artery cavernous segment. This is most likely related to thrombus in the right carotid terminus with poor flow to right middle cerebral artery branches (collateral flow noted along the periphery of the right middle cerebral artery distal branches). No significant stenosis left internal carotid artery, left middle cerebral artery or left anterior cerebral artery. Posterior circulation: No significant stenosis vertebral arteries or basilar artery. Venous sinuses: Patent Anatomic variants: Negative. Delayed phase: As above. 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 This is causing decreased caliber of the right internal carotid artery petrous and cavernous segment without high-grade proximal stenosis. Large area of ischemia involving the right middle cerebral artery distribution. Findings suggest significant reversible component. Images reviewed with Dr. Leonel Ramsay at times imaging. Electronically Signed   By: Genia Del M.D.   On: 05/04/2016 10:51   Ct Cervical Spine Wo Contrast  Result Date: 05/04/2016 CLINICAL DATA:  Pt was found down on floor, pt with left sided neglect and facial droop, pt was last seen  normal at 2200 last night EXAM: CT HEAD WITHOUT CONTRAST CT CERVICAL SPINE WITHOUT CONTRAST TECHNIQUE: Multidetector CT imaging of the head and cervical spine was performed following the standard protocol without intravenous contrast. Multiplanar CT image reconstructions of the cervical spine were also generated. COMPARISON:  None. FINDINGS: CT HEAD FINDINGS Brain: The ventricles are normal in size, for this patient's age, and normal in configuration. There are no parenchymal masses or mass effect. There is subtle decreased attenuation along the right insular cortex. No other parenchymal evidence of a recent infarct. No extra-axial masses or abnormal fluid collections. No intracranial hemorrhage. Vascular: There is a dense M1 segment of the right middle cerebral artery concerning for thrombus. Skull: No skull fracture or lesion. Sinuses/Orbits: Mild left maxillary mucosal thickening. Moderate ethmoid sinus mucosal thickening. Mild sphenoid and inferior frontal sinus mucosal thickening. Globes and orbits are unremarkable other than changes from cataract surgery. Other: Clear mastoid air cells. CT CERVICAL SPINE FINDINGS Alignment: Normal. Skull base and vertebrae: Skullbase alignment with the cervical spine is normal. No fractures. No bone lesions. Bones are diffusely demineralized. Soft tissues and spinal canal: No soft tissue masses or adenopathy. There are carotid artery vascular calcifications. Disc levels: There is loss of disc height with endplate sclerosis and spurring at all levels ranging from moderate to severe, greatest at C7-T1. Uncovertebral spurring leads to varying degrees of neural foraminal narrowing, which appears greatest on the left at C4-C5, moderate severity. Upper chest: Lung  apices are clear. Other: None IMPRESSION: HEAD CT: 1. Hyperdense right middle cerebral artery sign accompanied by subtle decreased attenuation along the right insular cortex. This suggests a recent right middle cerebral  artery infarct due to an M1 segment embolus/thrombus. Recommend follow-up brain MRI. 2. No other acute intracranial abnormalities. No intracranial hemorrhage. CERVICAL CT: 1. No fracture, spondylolisthesis or acute finding. Electronically Signed   By: Lajean Manes M.D.   On: 05/04/2016 09:18   Ct Cerebral Perfusion W Contrast  Result Date: 05/04/2016 CLINICAL DATA:  82 year old male with left-sided weakness and facial droop. Subsequent encounter. EXAM: CT ANGIOGRAPHY HEAD AND NECK.  CT PERFUSION (RAPID). TECHNIQUE: Multidetector CT imaging of the head and neck was performed using the standard protocol during bolus administration of intravenous contrast. Multiplanar CT image reconstructions and MIPs were obtained to evaluate the vascular anatomy. Carotid stenosis measurements (when applicable) are obtained utilizing NASCET criteria, using the distal internal carotid diameter as the denominator. CONTRAST:  100 cc Omnipaque 370. COMPARISON:  05/04/2016 CT head and cervical spine CT. FINDINGS: CT HEAD RAPID. PERFUSION: Large right middle cerebral artery distribution decreased profusion with small areas of possible infarction. RAPID findings with T-max greater than 6 seconds of 177 cc and CBF less than 30% of 0 cc. Overall mismatch volume of 177 cc. Brain: Large right middle cerebral artery distribution region of ischemia. No intracranial hemorrhage. No intracranial mass. Calvarium and skull base: No acute abnormality. Paranasal sinuses: Partial opacification left maxillary sinus and mucosal thickening/partial opacification ethmoid sinus air cells bilaterally. Orbits: No acute abnormality. CTA NECK Aortic arch: 3 vessel arch. Plaque right innominate artery origin without significant stenosis. Minimal plaque left subclavian artery origin. Right carotid system: Plaque right carotid bifurcation without significant stenosis. Ectatic distal cervical segment. Decreased flow proximal to the skullbase. Left carotid system:  Plaque left carotid bifurcation without hemodynamically significant stenosis. Ectatic distal cervical segment. Vertebral arteries:Mild narrowing origin left vertebral artery. Right vertebral artery slightly dominant in size. Skeleton: Cervical spondylotic changes. Other neck: No worrisome neck mass. Upper chest: No worrisome lung apical mass. CTA HEAD Anterior circulation: Decreased flow within the right internal carotid artery cavernous segment. This is most likely related to thrombus in the right carotid terminus with poor flow to right middle cerebral artery branches (collateral flow noted along the periphery of the right middle cerebral artery distal branches). No significant stenosis left internal carotid artery, left middle cerebral artery or left anterior cerebral artery. Posterior circulation: No significant stenosis vertebral arteries or basilar artery. Venous sinuses: Patent Anatomic variants: Negative. Delayed phase: As above. 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 This is causing decreased caliber of the right internal carotid artery petrous and cavernous segment without high-grade proximal stenosis. Large area of ischemia involving the right middle cerebral artery distribution. Findings suggest significant reversible component. Images reviewed with Dr. Leonel Ramsay at times imaging. Electronically Signed   By: Genia Del M.D.   On: 05/04/2016 10:51     STUDIES:  CT perfusion 9/14 > R MCA distribution ischemia. Echo 9/15 > MRI 9/15 >  CULTURES: None.  ANTIBIOTICS: None.  SIGNIFICANT EVENTS: 9/14 > admitted with right MCA ischemia > taken to IR for neuro intervention.  LINES/TUBES: ETT 9/14 >  DISCUSSION: 82 y.o. male admitted 9/14 with right MCA ischemia for which he was taken to IR where he had clot retrieval.  He returned to  the ICU and remained ventilated.  PCCM was asked to assist with vent management.  ASSESSMENT / PLAN:  NEUROLOGIC A:   Acute right MCA ischemia - s/p neuro IR with clot retrieval. Acute encephalopathy - due to above + sedation. Hx TIA. P:   Neuro following, stroke workup / management per them. Sedation:  Fentanyl gtt / Midazolam PRN. RASS goal: 0 to -1. Daily WUA. Hold preadmission alprazolam.  PULMONARY A: VDRF - in the setting of acute right MCA ischemia. P:   Full vent support. Wean as able. VAP prevention measures. SBT in AM if able with goal extubation. CXR in AM.  CARDIOVASCULAR A:  Hx HTN, A.flutter (on warfarin). P:  Monitor hemodynamics. Goal SBP 110 - 130. Defer heparin to neuro (in lieu of preadmission warfarin). Hold preadmission lotensin, doxazosin metoprolol, warfarin.  RENAL A:   Hypocalcemia. P:   NS @ 100. 1g Ca gluconate.  BMP in AM.  GASTROINTESTINAL A:   GI prophylaxis. Nutrition. Hx GI bleed. P:   SUP: Pantoprazole. NPO.  HEMATOLOGIC A:   Chronic anticoagulation due to A.fib (Warfarin). VTE Prophylaxis. P:  SCD's. Defer heparin to neuro (in lieu of preadmission warfarin). CBC in AM.  INFECTIOUS A:   No indication of infection. P:   Monitor clinically.  ENDOCRINE A:   No acute issues.   P:   Assess Hgb A1c.   Family updated: Wife and son updated at bedside.  Interdisciplinary Family Meeting v Palliative Care Meeting:  Due by: 9/20.  CC time: 35 minutes.   Montey Hora, [facility] Pager: [phone]  or [phone] 05/04/2016, 2:48 PM  Attending Note:  82 year old male with PMH of HTN and a-flutter on coumadin who presents to the hospital with AMS and noted to have a R MCA CVA.  Patient was taken to IR and clot retrieval was performed.  Patient remains sedated, paralyzed and intubated so physical exam is limited but lungs were clear to auscultation and ventilatory  pressures were of no concern.  I reviewed head CT myself, no bleeding noted.  Patient needs to remain flat so will not trial to extubate.  Keep sedate overnight and once sheaths are out then will need to evaluate the neuro status and begin weaning.  In the meantime, monitor vital signs.  BP parameters as ordered and will check a CXR and ABG now and adjust vent accordingly.  Family updated at length bedside, full code status.  The patient is critically ill with multiple organ systems failure and requires high complexity decision making for assessment and support, frequent evaluation and titration of therapies, application of advanced monitoring technologies and extensive interpretation of multiple databases.   Critical Care Time devoted to patient care services described in this note is  45  Minutes. This time reflects time of care of this signee Dr Jennet Maduro. This critical care time does not reflect procedure time, or teaching time or supervisory time of PA/NP/Med student/Med Resident etc but could involve care discussion time.  Rush Farmer, M.D. [facility] Pulmonary/Critical Care Medicine. Pager: [phone]. After hours pager: [phone].

## 2016-05-04 NOTE — ED Notes (Signed)
CT made aware that patient needs CT scan for angio and perfusion.

## 2016-05-04 NOTE — ED Notes (Signed)
Phlebotomy at bedside.

## 2016-05-05 ENCOUNTER — Inpatient Hospital Stay (HOSPITAL_COMMUNITY): Payer: Medicare Other

## 2016-05-05 ENCOUNTER — Encounter (HOSPITAL_COMMUNITY): Payer: Self-pay | Admitting: *Deleted

## 2016-05-05 DIAGNOSIS — J9601 Acute respiratory failure with hypoxia: Secondary | ICD-10-CM

## 2016-05-05 DIAGNOSIS — I63411 Cerebral infarction due to embolism of right middle cerebral artery: Secondary | ICD-10-CM

## 2016-05-05 DIAGNOSIS — Z87898 Personal history of other specified conditions: Secondary | ICD-10-CM

## 2016-05-05 DIAGNOSIS — I481 Persistent atrial fibrillation: Secondary | ICD-10-CM

## 2016-05-05 DIAGNOSIS — E785 Hyperlipidemia, unspecified: Secondary | ICD-10-CM

## 2016-05-05 DIAGNOSIS — J9600 Acute respiratory failure, unspecified whether with hypoxia or hypercapnia: Secondary | ICD-10-CM

## 2016-05-05 DIAGNOSIS — Z7901 Long term (current) use of anticoagulants: Secondary | ICD-10-CM

## 2016-05-05 LAB — BASIC METABOLIC PANEL
ANION GAP: 4 — AB (ref 5–15)
BUN: 9 mg/dL (ref 6–20)
CALCIUM: 8.2 mg/dL — AB (ref 8.9–10.3)
CO2: 24 mmol/L (ref 22–32)
Chloride: 113 mmol/L — ABNORMAL HIGH (ref 101–111)
Creatinine, Ser: 0.66 mg/dL (ref 0.61–1.24)
Glucose, Bld: 88 mg/dL (ref 65–99)
Potassium: 3.2 mmol/L — ABNORMAL LOW (ref 3.5–5.1)
Sodium: 141 mmol/L (ref 135–145)

## 2016-05-05 LAB — BLOOD GAS, ARTERIAL
Acid-base deficit: 1.5 mmol/L (ref 0.0–2.0)
BICARBONATE: 22.9 mmol/L (ref 20.0–28.0)
Drawn by: 365271
FIO2: 0.4
O2 SAT: 99 %
PEEP: 5 cmH2O
Patient temperature: 98.6
RATE: 14 resp/min
VT: 510 mL
pCO2 arterial: 39.6 mmHg (ref 32.0–48.0)
pH, Arterial: 7.381 (ref 7.350–7.450)
pO2, Arterial: 150 mmHg — ABNORMAL HIGH (ref 83.0–108.0)

## 2016-05-05 LAB — PROTIME-INR
INR: 2.88
INR: 1.48
INR: 1.3
PROTHROMBIN TIME: 18 s — AB (ref 11.4–15.2)
PROTHROMBIN TIME: 16.3 s — AB (ref 11.4–15.2)
Prothrombin Time: 30.7 seconds — ABNORMAL HIGH (ref 11.4–15.2)

## 2016-05-05 LAB — CBC WITH DIFFERENTIAL/PLATELET
BASOS ABS: 0 10*3/uL (ref 0.0–0.1)
BASOS PCT: 0 %
Eosinophils Absolute: 0 10*3/uL (ref 0.0–0.7)
Eosinophils Relative: 0 %
HEMATOCRIT: 34.4 % — AB (ref 39.0–52.0)
Hemoglobin: 11.3 g/dL — ABNORMAL LOW (ref 13.0–17.0)
Lymphocytes Relative: 15 %
Lymphs Abs: 1.1 10*3/uL (ref 0.7–4.0)
MCH: 29.6 pg (ref 26.0–34.0)
MCHC: 32.8 g/dL (ref 30.0–36.0)
MCV: 90.1 fL (ref 78.0–100.0)
MONO ABS: 0.7 10*3/uL (ref 0.1–1.0)
Monocytes Relative: 10 %
NEUTROS ABS: 5 10*3/uL (ref 1.7–7.7)
Neutrophils Relative %: 74 %
PLATELETS: 128 10*3/uL — AB (ref 150–400)
RBC: 3.82 MIL/uL — ABNORMAL LOW (ref 4.22–5.81)
RDW: 15 % (ref 11.5–15.5)
WBC: 6.8 10*3/uL (ref 4.0–10.5)

## 2016-05-05 LAB — LIPID PANEL
Cholesterol: 114 mg/dL (ref 0–200)
HDL: 41 mg/dL (ref >40)
LDL CALC: 62 mg/dL (ref 0–99)
TRIGLYCERIDES: 54 mg/dL (ref <150)
Total CHOL/HDL Ratio: 2.8 RATIO
VLDL: 11 mg/dL (ref 0–40)

## 2016-05-05 LAB — PHOSPHORUS: PHOSPHORUS: 2.3 mg/dL — AB (ref 2.5–4.6)

## 2016-05-05 LAB — MAGNESIUM: Magnesium: 1.6 mg/dL — ABNORMAL LOW (ref 1.7–2.4)

## 2016-05-05 MED ORDER — VITAMIN K1 10 MG/ML IJ SOLN
10.0000 mg | INTRAVENOUS | Status: AC
  Administered 2016-05-05: 10 mg via INTRAVENOUS
  Filled 2016-05-05: qty 1

## 2016-05-05 MED ORDER — PANTOPRAZOLE SODIUM 40 MG PO PACK
40.0000 mg | PACK | Freq: Every day | ORAL | Status: DC
  Administered 2016-05-05 – 2016-05-09 (×4): 40 mg
  Filled 2016-05-05 (×4): qty 20

## 2016-05-05 MED ORDER — FAMOTIDINE IN NACL 20-0.9 MG/50ML-% IV SOLN
20.0000 mg | Freq: Two times a day (BID) | INTRAVENOUS | Status: DC
  Administered 2016-05-05: 20 mg via INTRAVENOUS
  Filled 2016-05-05: qty 50

## 2016-05-05 MED ORDER — PROTHROMBIN COMPLEX CONC HUMAN 500 UNITS IV KIT
1636.0000 [IU] | PACK | Status: AC
  Administered 2016-05-05: 1636 [IU] via INTRAVENOUS
  Filled 2016-05-05: qty 65

## 2016-05-05 MED ORDER — HEPARIN SODIUM (PORCINE) 5000 UNIT/ML IJ SOLN
5000.0000 [IU] | Freq: Three times a day (TID) | INTRAMUSCULAR | Status: DC
  Administered 2016-05-05 – 2016-05-10 (×14): 5000 [IU] via SUBCUTANEOUS
  Filled 2016-05-05 (×13): qty 1

## 2016-05-05 MED ORDER — ASPIRIN 325 MG PO TABS
325.0000 mg | ORAL_TABLET | Freq: Every day | ORAL | Status: DC
  Administered 2016-05-05 – 2016-05-09 (×5): 325 mg via ORAL
  Filled 2016-05-05 (×5): qty 1

## 2016-05-05 MED ORDER — FENTANYL BOLUS VIA INFUSION
25.0000 ug | INTRAVENOUS | Status: DC | PRN
Start: 2016-05-05 — End: 2016-05-08
  Filled 2016-05-05: qty 50

## 2016-05-05 MED ORDER — POTASSIUM PHOSPHATES 15 MMOLE/5ML IV SOLN
20.0000 mmol | Freq: Once | INTRAVENOUS | Status: AC
  Administered 2016-05-05: 20 mmol via INTRAVENOUS
  Filled 2016-05-05: qty 6.67

## 2016-05-05 MED ORDER — METOPROLOL SUCCINATE ER 25 MG PO TB24: 25.0000 mg | ORAL_TABLET | Freq: Every day | ORAL | Status: DC

## 2016-05-05 MED ORDER — ASPIRIN EC 325 MG PO TBEC
325.0000 mg | DELAYED_RELEASE_TABLET | Freq: Every day | ORAL | Status: DC
  Filled 2016-05-05: qty 1

## 2016-05-05 MED ORDER — METOPROLOL TARTRATE 25 MG PO TABS
25.0000 mg | ORAL_TABLET | Freq: Every day | ORAL | Status: DC
  Administered 2016-05-05 – 2016-05-10 (×6): 25 mg via ORAL
  Filled 2016-05-05 (×7): qty 1

## 2016-05-05 NOTE — Progress Notes (Signed)
PT Cancellation Note  Patient Details Name: Brian [name] MRN: [id number] DOB: July 15, 1933   Cancelled Treatment:    Reason Eval/Treat Not Completed: Patient not medically ready (awaiting sheath removal)   Duncan Dull 05/05/2016, 9:20 AM Alben Deeds, PT DPT  [phone]

## 2016-05-05 NOTE — Progress Notes (Signed)
OT Cancellation Note  Patient Details Name: Brian [name] MRN: [id number] DOB: October 22, 1932   Cancelled Treatment:    Reason Eval/Treat Not Completed: Patient not medically ready (awaiting sheath removal)  Binnie Kand M.S., OTR/L Pager: [phone]  05/05/2016, 9:21 AM

## 2016-05-05 NOTE — Progress Notes (Signed)
[facility] Progress Note Patient Name: Brian [name] DOB: 1933/02/13 MRN: [id number]   Date of Service  05/05/2016  HPI/Events of Note  Camera check on patient during spontaneous breathing trial. Low-dose fentanyl infusion has been off for now approximately 20 minutes. Patient failing to trigger ventilator consistently. Eyes closed and very sedate.   eICU Interventions  1. Holding off on extubation 2. Nurse to fentanyl infusion off 3. Using when necessary fentanyl pushes as needed for patient comfort 4. Plan for spontaneous breathing trial with probable extubation in the morning      Intervention Category Major Interventions: Respiratory failure - evaluation and management  Tera Partridge 05/05/2016, 10:01 PM

## 2016-05-05 NOTE — Progress Notes (Signed)
Patient ID: Brian [name], male   DOB: [birth year], 82 y.o.   MRN: [id number]    Referring Physician(s): Dr. Roland Rack  Supervising Physician: [name]  Patient Status: inpt  Chief Complaint: MCA CVA  Subjective: Patient intubated, but awakens and moves all 4 extremities  Allergies: Novocain [procaine hcl] and Penicillins  Medications: Prior to Admission medications   Medication Sig Start Date End Date Taking? Authorizing Provider  ALPRAZolam (XANAX) 0.25 MG tablet Take 1 tablet (0.25 mg total) by mouth at bedtime. 03/22/16  Yes Marletta Lor, MD  benazepril-hydrochlorthiazide (LOTENSIN HCT) 5-6.25 MG tablet Take 1 tablet by mouth daily. 03/22/16  Yes Marletta Lor, MD  clindamycin (CLEOCIN) 150 MG capsule TAKE 2 CAPSULES BY MOUTH PRIOR TO DENTAL WORK AS DIRECTED 11/10/15  Yes Marletta Lor, MD  doxazosin (CARDURA) 1 MG tablet TAKE 1 TABLET BY MOUTH AT BEDTIME 03/22/16  Yes Marletta Lor, MD  doxycycline (VIBRA-TABS) 100 MG tablet TAKE 1 TABLET BY MOUTH TWICE DAILY 03/08/16  Yes Marletta Lor, MD  finasteride (PROSCAR) 5 MG tablet Take 1 tablet (5 mg total) by mouth daily. 03/22/16  Yes Marletta Lor, MD  metoprolol succinate (TOPROL-XL) 25 MG 24 hr tablet TAKE 1 TABLET BY MOUTH EVERY MORNING AND 2 TABLETS BY MOUTH EVERY EVENING 03/22/16  Yes Marletta Lor, MD  pantoprazole (PROTONIX) 40 MG tablet Take 1 tablet (40 mg total) by mouth daily. 03/22/16  Yes Marletta Lor, MD  PROCTOZONE-HC 2.5 % rectal cream USE RECTALLY TWICE DAILY 08/04/15  Yes Marletta Lor, MD  saw palmetto 160 MG capsule Take 1 capsule (160 mg total) by mouth 2 (two) times daily. 01/26/14  Yes Ricard Dillon, MD  warfarin (COUMADIN) 5 MG tablet Take 1 tablet by mouth daily or as directed. 03/22/16  Yes Marletta Lor, MD    Vital Signs: BP (!) 110/53   Pulse 67   Temp 99.3 F (37.4 C) (Oral)   Resp 14   Ht 5\' 7"  (1.702 m)   Wt 158 lb 1.1 oz (71.7  kg)   SpO2 100%   BMI 24.76 kg/m   Physical Exam: Gen: NAD, intubated Neuro: follows commands, moves all 4 extremities.  Good strength in lower extremities Skin: right groin with sheath still in place, site is clean and not bleeding.  Old blood on bandage  Imaging: Ct Angio Head W Or Wo Contrast  Result Date: 05/04/2016 CLINICAL DATA:  82 year old male with left-sided weakness and facial droop. Subsequent encounter. EXAM: CT ANGIOGRAPHY HEAD AND NECK.  CT PERFUSION (RAPID). TECHNIQUE: Multidetector CT imaging of the head and neck was performed using the standard protocol during bolus administration of intravenous contrast. Multiplanar CT image reconstructions and MIPs were obtained to evaluate the vascular anatomy. Carotid stenosis measurements (when applicable) are obtained utilizing NASCET criteria, using the distal internal carotid diameter as the denominator. CONTRAST:  100 cc Omnipaque 370. COMPARISON:  05/04/2016 CT head and cervical spine CT. FINDINGS: CT HEAD RAPID. PERFUSION: Large right middle cerebral artery distribution decreased profusion with small areas of possible infarction. RAPID findings with T-max greater than 6 seconds of 177 cc and CBF less than 30% of 0 cc. Overall mismatch volume of 177 cc. Brain: Large right middle cerebral artery distribution region of ischemia. No intracranial hemorrhage. No intracranial mass. Calvarium and skull base: No acute abnormality. Paranasal sinuses: Partial opacification left maxillary sinus and mucosal thickening/partial opacification ethmoid sinus air cells bilaterally. Orbits: No acute abnormality. CTA NECK  Aortic arch: 3 vessel arch. Plaque right innominate artery origin without significant stenosis. Minimal plaque left subclavian artery origin. Right carotid system: Plaque right carotid bifurcation without significant stenosis. Ectatic distal cervical segment. Decreased flow proximal to the skullbase. Left carotid system: Plaque left carotid  bifurcation without hemodynamically significant stenosis. Ectatic distal cervical segment. Vertebral arteries:Mild narrowing origin left vertebral artery. Right vertebral artery slightly dominant in size. Skeleton: Cervical spondylotic changes. Other neck: No worrisome neck mass. Upper chest: No worrisome lung apical mass. CTA HEAD Anterior circulation: Decreased flow within the right internal carotid artery cavernous segment. This is most likely related to thrombus in the right carotid terminus with poor flow to right middle cerebral artery branches (collateral flow noted along the periphery of the right middle cerebral artery distal branches). No significant stenosis left internal carotid artery, left middle cerebral artery or left anterior cerebral artery. Posterior circulation: No significant stenosis vertebral arteries or basilar artery. Venous sinuses: Patent Anatomic variants: Negative. Delayed phase: As above. 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 This is causing decreased caliber of the right internal carotid artery petrous and cavernous segment without high-grade proximal stenosis. Large area of ischemia involving the right middle cerebral artery distribution. Findings suggest significant reversible component. Images reviewed with Dr. Leonel Ramsay at times imaging. Electronically Signed   By: Genia Del M.D.   On: 05/04/2016 10:51   Ct Head Wo Contrast  Result Date: 05/04/2016 CLINICAL DATA:  Pt was found down on floor, pt with left sided neglect and facial droop, pt was last seen normal at 2200 last night EXAM: CT HEAD WITHOUT CONTRAST CT CERVICAL SPINE WITHOUT CONTRAST TECHNIQUE: Multidetector CT imaging of the head and cervical spine was performed following the standard protocol without intravenous contrast. Multiplanar CT image reconstructions  of the cervical spine were also generated. COMPARISON:  None. FINDINGS: CT HEAD FINDINGS Brain: The ventricles are normal in size, for this patient's age, and normal in configuration. There are no parenchymal masses or mass effect. There is subtle decreased attenuation along the right insular cortex. No other parenchymal evidence of a recent infarct. No extra-axial masses or abnormal fluid collections. No intracranial hemorrhage. Vascular: There is a dense M1 segment of the right middle cerebral artery concerning for thrombus. Skull: No skull fracture or lesion. Sinuses/Orbits: Mild left maxillary mucosal thickening. Moderate ethmoid sinus mucosal thickening. Mild sphenoid and inferior frontal sinus mucosal thickening. Globes and orbits are unremarkable other than changes from cataract surgery. Other: Clear mastoid air cells. CT CERVICAL SPINE FINDINGS Alignment: Normal. Skull base and vertebrae: Skullbase alignment with the cervical spine is normal. No fractures. No bone lesions. Bones are diffusely demineralized. Soft tissues and spinal canal: No soft tissue masses or adenopathy. There are carotid artery vascular calcifications. Disc levels: There is loss of disc height with endplate sclerosis and spurring at all levels ranging from moderate to severe, greatest at C7-T1. Uncovertebral spurring leads to varying degrees of neural foraminal narrowing, which appears greatest on the left at C4-C5, moderate severity. Upper chest: Lung apices are clear. Other: None IMPRESSION: HEAD CT: 1. Hyperdense right middle cerebral artery sign accompanied by subtle decreased attenuation along the right insular cortex. This suggests a recent right middle cerebral artery infarct due to an M1 segment embolus/thrombus. Recommend follow-up brain MRI. 2. No other acute intracranial abnormalities. No intracranial hemorrhage. CERVICAL CT: 1. No fracture, spondylolisthesis or acute  finding. Electronically Signed   By: Lajean Manes M.D.    On: 05/04/2016 09:18   Ct Angio Neck W Or Wo Contrast  Result Date: 05/04/2016 CLINICAL DATA:  82 year old male with left-sided weakness and facial droop. Subsequent encounter. EXAM: CT ANGIOGRAPHY HEAD AND NECK.  CT PERFUSION (RAPID). TECHNIQUE: Multidetector CT imaging of the head and neck was performed using the standard protocol during bolus administration of intravenous contrast. Multiplanar CT image reconstructions and MIPs were obtained to evaluate the vascular anatomy. Carotid stenosis measurements (when applicable) are obtained utilizing NASCET criteria, using the distal internal carotid diameter as the denominator. CONTRAST:  100 cc Omnipaque 370. COMPARISON:  05/04/2016 CT head and cervical spine CT. FINDINGS: CT HEAD RAPID. PERFUSION: Large right middle cerebral artery distribution decreased profusion with small areas of possible infarction. RAPID findings with T-max greater than 6 seconds of 177 cc and CBF less than 30% of 0 cc. Overall mismatch volume of 177 cc. Brain: Large right middle cerebral artery distribution region of ischemia. No intracranial hemorrhage. No intracranial mass. Calvarium and skull base: No acute abnormality. Paranasal sinuses: Partial opacification left maxillary sinus and mucosal thickening/partial opacification ethmoid sinus air cells bilaterally. Orbits: No acute abnormality. CTA NECK Aortic arch: 3 vessel arch. Plaque right innominate artery origin without significant stenosis. Minimal plaque left subclavian artery origin. Right carotid system: Plaque right carotid bifurcation without significant stenosis. Ectatic distal cervical segment. Decreased flow proximal to the skullbase. Left carotid system: Plaque left carotid bifurcation without hemodynamically significant stenosis. Ectatic distal cervical segment. Vertebral arteries:Mild narrowing origin left vertebral artery. Right vertebral artery slightly dominant in size. Skeleton: Cervical spondylotic changes. Other  neck: No worrisome neck mass. Upper chest: No worrisome lung apical mass. CTA HEAD Anterior circulation: Decreased flow within the right internal carotid artery cavernous segment. This is most likely related to thrombus in the right carotid terminus with poor flow to right middle cerebral artery branches (collateral flow noted along the periphery of the right middle cerebral artery distal branches). No significant stenosis left internal carotid artery, left middle cerebral artery or left anterior cerebral artery. Posterior circulation: No significant stenosis vertebral arteries or basilar artery. Venous sinuses: Patent Anatomic variants: Negative. Delayed phase: As above. 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 This is causing decreased caliber of the right internal carotid artery petrous and cavernous segment without high-grade proximal stenosis. Large area of ischemia involving the right middle cerebral artery distribution. Findings suggest significant reversible component. Images reviewed with Dr. Leonel Ramsay at times imaging. Electronically Signed   By: Genia Del M.D.   On: 05/04/2016 10:51   Ct Cervical Spine Wo Contrast  Result Date: 05/04/2016 CLINICAL DATA:  Pt was found down on floor, pt with left sided neglect and facial droop, pt was last seen normal at 2200 last night EXAM: CT HEAD WITHOUT CONTRAST CT CERVICAL SPINE WITHOUT CONTRAST TECHNIQUE: Multidetector CT imaging of the head and cervical spine was performed following the standard protocol without intravenous contrast. Multiplanar CT image reconstructions of the cervical spine were also generated. COMPARISON:  None. FINDINGS: CT HEAD FINDINGS Brain: The ventricles are normal in size, for this patient's age, and normal in configuration. There are no parenchymal masses or mass effect. There is  subtle decreased attenuation along the right insular cortex. No other parenchymal evidence of a recent infarct. No extra-axial masses or abnormal fluid collections. No intracranial  hemorrhage. Vascular: There is a dense M1 segment of the right middle cerebral artery concerning for thrombus. Skull: No skull fracture or lesion. Sinuses/Orbits: Mild left maxillary mucosal thickening. Moderate ethmoid sinus mucosal thickening. Mild sphenoid and inferior frontal sinus mucosal thickening. Globes and orbits are unremarkable other than changes from cataract surgery. Other: Clear mastoid air cells. CT CERVICAL SPINE FINDINGS Alignment: Normal. Skull base and vertebrae: Skullbase alignment with the cervical spine is normal. No fractures. No bone lesions. Bones are diffusely demineralized. Soft tissues and spinal canal: No soft tissue masses or adenopathy. There are carotid artery vascular calcifications. Disc levels: There is loss of disc height with endplate sclerosis and spurring at all levels ranging from moderate to severe, greatest at C7-T1. Uncovertebral spurring leads to varying degrees of neural foraminal narrowing, which appears greatest on the left at C4-C5, moderate severity. Upper chest: Lung apices are clear. Other: None IMPRESSION: HEAD CT: 1. Hyperdense right middle cerebral artery sign accompanied by subtle decreased attenuation along the right insular cortex. This suggests a recent right middle cerebral artery infarct due to an M1 segment embolus/thrombus. Recommend follow-up brain MRI. 2. No other acute intracranial abnormalities. No intracranial hemorrhage. CERVICAL CT: 1. No fracture, spondylolisthesis or acute finding. Electronically Signed   By: Lajean Manes M.D.   On: 05/04/2016 09:18   Mr Brain Wo Contrast  Result Date: 05/05/2016 CLINICAL DATA:  Left-sided hemi paresis. EXAM: MRI HEAD WITHOUT CONTRAST MRA HEAD WITHOUT CONTRAST TECHNIQUE: Multiplanar, multiecho pulse sequences of the brain and  surrounding structures were obtained without intravenous contrast. Angiographic images of the head were obtained using MRA technique without contrast. COMPARISON:  Head CT 05/04/2016 FINDINGS: MRI HEAD FINDINGS Brain: There is focal diffusion restriction within the right caudate head. Gradient echo imaging shows focal susceptibility within the right basal ganglia in the distribution of the hyperdensity demonstrated on the recent head CT. The midline structures are normal. There is multifocal hyperintense T2 weighted signal within the subcortical white matter. There is mild mass effect on the frontal horn of the right lateral ventricle. No midline shift or evidence of herniation. No hydrocephalus. Vascular: As above, focal susceptibility within the right basal ganglia. No evidence of chronic microhemorrhage or amyloid angiopathy. Skull and upper cervical spine: The visualized skull base, calvarium, upper cervical spine and extracranial soft tissues are normal. Sinuses/Orbits: Fluid within the sphenoid sinuses. Maxillary mucosal thickening. Bilateral lens replacements. MRA HEAD FINDINGS Intracranial internal carotid arteries: Normal. Anterior cerebral arteries: Normal. Middle cerebral arteries: Normal. Posterior communicating arteries: Not visualized. Posterior cerebral arteries: Normal. Basilar artery: Normal. Vertebral arteries: Codominant. Normal. Superior cerebellar arteries: Normal. Anterior inferior cerebellar arteries: Normal on the right. Not visualized on the left. Posterior inferior cerebellar arteries: Normal. IMPRESSION: 1. Acute infarct of the right caudate head. No midline shift or significant mass effect. 2. Focal susceptibility within the right basal ganglia in the location of intraparenchymal hyperattenuation seen on the prior head CT. This is likely due to the presence of deoxyhemoglobin, favoring petechial hemorrhage over contrast staining. Short interval follow-up head CT is recommended, as  contrast extravasation may be expected to clear 24 hours following angiography. 3. Normal MRA of the circle of Willis. Electronically Signed   By: Ulyses Jarred M.D.   On: 05/05/2016 04:33   Ct Cerebral Perfusion W Contrast  Result Date: 05/04/2016 CLINICAL DATA:  82 year old male with left-sided weakness and facial droop. Subsequent encounter. EXAM: CT ANGIOGRAPHY HEAD AND NECK.  CT PERFUSION (RAPID). TECHNIQUE: Multidetector CT imaging of the head and  neck was performed using the standard protocol during bolus administration of intravenous contrast. Multiplanar CT image reconstructions and MIPs were obtained to evaluate the vascular anatomy. Carotid stenosis measurements (when applicable) are obtained utilizing NASCET criteria, using the distal internal carotid diameter as the denominator. CONTRAST:  100 cc Omnipaque 370. COMPARISON:  05/04/2016 CT head and cervical spine CT. FINDINGS: CT HEAD RAPID. PERFUSION: Large right middle cerebral artery distribution decreased profusion with small areas of possible infarction. RAPID findings with T-max greater than 6 seconds of 177 cc and CBF less than 30% of 0 cc. Overall mismatch volume of 177 cc. Brain: Large right middle cerebral artery distribution region of ischemia. No intracranial hemorrhage. No intracranial mass. Calvarium and skull base: No acute abnormality. Paranasal sinuses: Partial opacification left maxillary sinus and mucosal thickening/partial opacification ethmoid sinus air cells bilaterally. Orbits: No acute abnormality. CTA NECK Aortic arch: 3 vessel arch. Plaque right innominate artery origin without significant stenosis. Minimal plaque left subclavian artery origin. Right carotid system: Plaque right carotid bifurcation without significant stenosis. Ectatic distal cervical segment. Decreased flow proximal to the skullbase. Left carotid system: Plaque left carotid bifurcation without hemodynamically significant stenosis. Ectatic distal cervical  segment. Vertebral arteries:Mild narrowing origin left vertebral artery. Right vertebral artery slightly dominant in size. Skeleton: Cervical spondylotic changes. Other neck: No worrisome neck mass. Upper chest: No worrisome lung apical mass. CTA HEAD Anterior circulation: Decreased flow within the right internal carotid artery cavernous segment. This is most likely related to thrombus in the right carotid terminus with poor flow to right middle cerebral artery branches (collateral flow noted along the periphery of the right middle cerebral artery distal branches). No significant stenosis left internal carotid artery, left middle cerebral artery or left anterior cerebral artery. Posterior circulation: No significant stenosis vertebral arteries or basilar artery. Venous sinuses: Patent Anatomic variants: Negative. Delayed phase: As above. 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 This is causing decreased caliber of the right internal carotid artery petrous and cavernous segment without high-grade proximal stenosis. Large area of ischemia involving the right middle cerebral artery distribution. Findings suggest significant reversible component. Images reviewed with Dr. Leonel Ramsay at times imaging. Electronically Signed   By: Genia Del M.D.   On: 05/04/2016 10:51   Dg Chest Port 1 View  Result Date: 05/05/2016 CLINICAL DATA:  Intubation, history hypertension, atrial flutter, stroke EXAM: PORTABLE CHEST 1 VIEW COMPARISON:  Portable exam 0619 hours compared to 05/04/2016 FINDINGS: Tip of endotracheal tube projects 6.8 cm above carina. Enlargement of cardiac silhouette post MVR. Mediastinal contours and pulmonary vascularity normal. Minimal bibasilar atelectasis. LEFT costophrenic angle excluded. No gross infiltrate, pleural effusion or pneumothorax. Minimal  central peribronchial thickening. BILATERAL glenohumeral degenerative changes. IMPRESSION: Minimal bibasilar atelectasis. Enlargement cardiac silhouette post MVR. Electronically Signed   By: Lavonia Dana M.D.   On: 05/05/2016 09:03   Dg Chest Port 1 View  Result Date: 05/04/2016 CLINICAL DATA:  Endotracheal tube placement. EXAM: PORTABLE CHEST 1 VIEW COMPARISON:  None. FINDINGS: Endotracheal tube tip projects 5 cm above the carina. There changes from cardiac surgery and valve replacement. Cardiac silhouette is mildly enlarged. Aorta is uncoiled and prominent. No mediastinal or hilar masses or convincing adenopathy. Lungs are clear.  No pleural effusion or pneumothorax. Skeletal structures are demineralized. There are arthropathic changes of the right glenohumeral joint and significant narrowing of the subacromial space consistent with chronic  full-thickness rotator cuff tear. IMPRESSION: 1. No acute cardiopulmonary disease. 2. Endotracheal tube tip projects 5 cm above the carina. Electronically Signed   By: Lajean Manes M.D.   On: 05/04/2016 16:52   Mr Jodene Nam Head/brain [id number] Cm  Result Date: 05/05/2016 CLINICAL DATA:  Left-sided hemi paresis. EXAM: MRI HEAD WITHOUT CONTRAST MRA HEAD WITHOUT CONTRAST TECHNIQUE: Multiplanar, multiecho pulse sequences of the brain and surrounding structures were obtained without intravenous contrast. Angiographic images of the head were obtained using MRA technique without contrast. COMPARISON:  Head CT 05/04/2016 FINDINGS: MRI HEAD FINDINGS Brain: There is focal diffusion restriction within the right caudate head. Gradient echo imaging shows focal susceptibility within the right basal ganglia in the distribution of the hyperdensity demonstrated on the recent head CT. The midline structures are normal. There is multifocal hyperintense T2 weighted signal within the subcortical white matter. There is mild mass effect on the frontal horn of the right lateral ventricle. No midline shift  or evidence of herniation. No hydrocephalus. Vascular: As above, focal susceptibility within the right basal ganglia. No evidence of chronic microhemorrhage or amyloid angiopathy. Skull and upper cervical spine: The visualized skull base, calvarium, upper cervical spine and extracranial soft tissues are normal. Sinuses/Orbits: Fluid within the sphenoid sinuses. Maxillary mucosal thickening. Bilateral lens replacements. MRA HEAD FINDINGS Intracranial internal carotid arteries: Normal. Anterior cerebral arteries: Normal. Middle cerebral arteries: Normal. Posterior communicating arteries: Not visualized. Posterior cerebral arteries: Normal. Basilar artery: Normal. Vertebral arteries: Codominant. Normal. Superior cerebellar arteries: Normal. Anterior inferior cerebellar arteries: Normal on the right. Not visualized on the left. Posterior inferior cerebellar arteries: Normal. IMPRESSION: 1. Acute infarct of the right caudate head. No midline shift or significant mass effect. 2. Focal susceptibility within the right basal ganglia in the location of intraparenchymal hyperattenuation seen on the prior head CT. This is likely due to the presence of deoxyhemoglobin, favoring petechial hemorrhage over contrast staining. Short interval follow-up head CT is recommended, as contrast extravasation may be expected to clear 24 hours following angiography. 3. Normal MRA of the circle of Willis. Electronically Signed   By: Ulyses Jarred M.D.   On: 05/05/2016 04:33   Ct Head Code Stroke Wo Contrast`  Result Date: 05/04/2016 CLINICAL DATA:  Code stroke. Follow-up exam following right common carotid arteriogram with revascularization of occluded right M1 segment. EXAM: CT HEAD WITHOUT CONTRAST TECHNIQUE: Contiguous axial images were obtained from the base of the skull through the vertex without intravenous contrast. COMPARISON:  Prior studies from earlier the same day. FINDINGS: Blush of hyperdensity involves the right basal ganglia  act, specifically the right caudate head and lentiform nucleus. Finding most consistent with contrast staining from recent catheter directed arteriogram rather than hemorrhage. IV contrast within the vascular structures of the brain noted. No definite acute intracranial hemorrhage. Gray-white matter differentiation otherwise maintained at this time without definite evidence for evolving ischemia. Stable atrophy with chronic microvascular ischemic disease. No mass lesion or midline shift. No mass effect. No hydrocephalus. No extra-axial fluid collection. Scalp soft tissues within normal limits. Globes and orbits demonstrate no acute abnormality. Patient status post lens extraction bilaterally. Scattered mucosal thickening throughout the maxillary sinuses and ethmoidal air cells as well as the sphenoid sinuses. Small fluid levels within the sphenoid sinuses present. No mastoid effusion. Calvarium intact. IMPRESSION: 1. Mild blush of hyperdensity involving the right basal ganglia, most consistent with contrast staining status post recent catheter directed arteriogram. No definite acute intracranial hemorrhage identified, although close interval follow-up is warranted. 2. No other  significant evolving right MCA territory infarct identified at this time. 3. No other new acute intracranial process. Electronically Signed   By: Jeannine Boga M.D.   On: 05/04/2016 14:35    Labs:  CBC:  Recent Labs  09/22/15 0954 05/04/16 0920 05/04/16 0934 05/05/16 0500  WBC 6.1 9.3  --  6.8  HGB 14.9 13.7 14.6 11.3*  HCT 45.1 41.4 43.0 34.4*  PLT 219.0 141*  --  128*    COAGS:  Recent Labs  04/28/16 05/04/16 0920 05/05/16 1000 05/05/16 1310  INR 2.8 2.45 2.88 1.48  APTT  --  35  --   --     BMP:  Recent Labs  09/22/15 0954 05/04/16 0920 05/04/16 0934 05/05/16 0500  NA 140 140 140 141  K 4.6 3.7 3.6 3.2*  CL 102 106 101 113*  CO2 29 26  --  24  GLUCOSE 89 113* 108* 88  BUN 18 17 20 9     CALCIUM 9.6 9.0  --  8.2*  CREATININE 0.74 0.78 0.80 0.66  GFRNONAA  --  >60  --  >60  GFRAA  --  >60  --  >60    LIVER FUNCTION TESTS:  Recent Labs  09/22/15 0954 05/04/16 0920  BILITOT 0.7 1.0  AST 27 35  ALT 23 29  ALKPHOS 109 97  PROT 6.6 6.5  ALBUMIN 4.1 3.8    Assessment and Plan: 1. S/p revascularization of (R) MCA, s/p CVA -INR down to 1.48 today.  Will place sheath orders for his sheath to be removed -will check site tomorrow after sheath pulled -will follow  Electronically Signed: Torrell Krutz E 05/05/2016, 2:32 PM   I spent a total of 15 Minutes at the the patient's bedside AND on the patient's hospital floor or unit, greater than 50% of which was counseling/coordinating care for (R) MCA CVA, s/p revascularization

## 2016-05-05 NOTE — Progress Notes (Signed)
PULMONARY / CRITICAL CARE MEDICINE   Name: TAEVEON [name] MRN: [id number] DOB: [birth year]    ADMISSION DATE:  05/04/2016 CONSULTATION DATE:  05/04/16  REFERRING MD:  Brian [name] - EDP  CHIEF COMPLAINT:  Code Stroke  HISTORY OF PRESENT ILLNESS:  Pt is encephelopathic; therefore, this HPI is obtained from chart review. Brian [name] is a 82 y.o. male with PMH as outlined below. He was brought to [facility] ED 9/14 as code stroke after being found down in kitchen with left sided hemiparesis.  He was last seen normal over 12 hours earlier, around 6:15 PM the evening prior.  Per pt's son, at roughly 6:15 AM on day of presentation, pt called out for son and when son came out, he found him on the floor unable to move left side.  In ED, he had CT of the head which demonstrated hyperdense right MCA.  CT angiogram demonstrated significant amount of viable tissue.  He was subsequently taken to IR for neuro intervention.  Post procedure, he remained on the vent and PCCM was asked to assist with vent management.  SUBJECTIVE:  On vent, awake and alert  VITAL SIGNS: BP 119/62   Pulse 86   Temp 99.6 F (37.6 C) (Oral)   Resp 19   Ht 5\' 7"  (1.702 m)   Wt 158 lb 1.1 oz (71.7 kg)   SpO2 100%   BMI 24.76 kg/m   HEMODYNAMICS:    VENTILATOR SETTINGS: Vent Mode: PSV;CPAP FiO2 (%):  [40 %-50 %] 40 % Set Rate:  [14 bmp] 14 bmp Vt Set:  [510 mL] 510 mL PEEP:  [5 cmH20] 5 cmH20 Pressure Support:  [5 cmH20] 5 cmH20 Plateau Pressure:  [10 cmH20-16 cmH20] 10 cmH20  INTAKE / OUTPUT: I/O last 3 completed shifts: In: 3022 [I.V.:2912; IV Piggyback:110] Out: 1955 [id number]; Blood:5]   PHYSICAL EXAMINATION: General: Adult male, resting in bed, in NAD. Neuro: Follows commands, awake and alert HEENT: [place]/AT. PERRL, sclerae anicteric. Cardiovascular: IRIR, no M/R/G.  Lungs: Respirations even and unlabored.  CTA bilaterally, No W/R/R.  Abdomen: BS x 4, soft, NT/ND.  Musculoskeletal: No gross deformities, no edema.   Skin: Intact, warm, no rashes.     LABS:  BMET  Recent Labs Lab 05/04/16 0920 05/04/16 0934 05/05/16 0500  NA 140 140 141  K 3.7 3.6 3.2*  CL 106 101 113*  CO2 26  --  24  BUN 17 20 9   CREATININE 0.78 0.80 0.66  GLUCOSE 113* 108* 88    Electrolytes  Recent Labs Lab 05/04/16 0920 05/05/16 0500  CALCIUM 9.0 8.2*  MG  --  1.6*  PHOS  --  2.3*    CBC  Recent Labs Lab 05/04/16 0920 05/04/16 0934 05/05/16 0500  WBC 9.3  --  6.8  HGB 13.7 14.6 11.3*  HCT 41.4 43.0 34.4*  PLT 141*  --  128*    Coag's  Recent Labs Lab 05/04/16 0920 05/05/16 1000  APTT 35  --   INR 2.45 2.88    Sepsis Markers No results for input(s): LATICACIDVEN, PROCALCITON, O2SATVEN in the last 168 hours.  ABG  Recent Labs Lab 05/04/16 1440 05/05/16 0426  PHART 7.430 7.381  PCO2ART 36.6 39.6  PO2ART 125* 150*    Liver Enzymes  Recent Labs Lab 05/04/16 0920  AST 35  ALT 29  ALKPHOS 97  BILITOT 1.0  ALBUMIN 3.8    Cardiac Enzymes No results for input(s): TROPONINI, PROBNP in the last 168 hours.  Glucose No results  for input(s): GLUCAP in the last 168 hours.  Imaging Mr Brain Wo Contrast  Result Date: 05/05/2016 CLINICAL DATA:  Left-sided hemi paresis. EXAM: MRI HEAD WITHOUT CONTRAST MRA HEAD WITHOUT CONTRAST TECHNIQUE: Multiplanar, multiecho pulse sequences of the brain and surrounding structures were obtained without intravenous contrast. Angiographic images of the head were obtained using MRA technique without contrast. COMPARISON:  Head CT 05/04/2016 FINDINGS: MRI HEAD FINDINGS Brain: There is focal diffusion restriction within the right caudate head. Gradient echo imaging shows focal susceptibility within the right basal ganglia in the distribution of the hyperdensity demonstrated on the recent head CT. The midline structures are normal. There is multifocal hyperintense T2 weighted signal within the subcortical white matter. There is mild mass effect on the  frontal horn of the right lateral ventricle. No midline shift or evidence of herniation. No hydrocephalus. Vascular: As above, focal susceptibility within the right basal ganglia. No evidence of chronic microhemorrhage or amyloid angiopathy. Skull and upper cervical spine: The visualized skull base, calvarium, upper cervical spine and extracranial soft tissues are normal. Sinuses/Orbits: Fluid within the sphenoid sinuses. Maxillary mucosal thickening. Bilateral lens replacements. MRA HEAD FINDINGS Intracranial internal carotid arteries: Normal. Anterior cerebral arteries: Normal. Middle cerebral arteries: Normal. Posterior communicating arteries: Not visualized. Posterior cerebral arteries: Normal. Basilar artery: Normal. Vertebral arteries: Codominant. Normal. Superior cerebellar arteries: Normal. Anterior inferior cerebellar arteries: Normal on the right. Not visualized on the left. Posterior inferior cerebellar arteries: Normal. IMPRESSION: 1. Acute infarct of the right caudate head. No midline shift or significant mass effect. 2. Focal susceptibility within the right basal ganglia in the location of intraparenchymal hyperattenuation seen on the prior head CT. This is likely due to the presence of deoxyhemoglobin, favoring petechial hemorrhage over contrast staining. Short interval follow-up head CT is recommended, as contrast extravasation may be expected to clear 24 hours following angiography. 3. Normal MRA of the circle of Willis. Electronically Signed   By: Ulyses Jarred M.D.   On: 05/05/2016 04:33   Dg Chest Port 1 View  Result Date: 05/05/2016 CLINICAL DATA:  Intubation, history hypertension, atrial flutter, stroke EXAM: PORTABLE CHEST 1 VIEW COMPARISON:  Portable exam 0619 hours compared to 05/04/2016 FINDINGS: Tip of endotracheal tube projects 6.8 cm above carina. Enlargement of cardiac silhouette post MVR. Mediastinal contours and pulmonary vascularity normal. Minimal bibasilar atelectasis. LEFT  costophrenic angle excluded. No gross infiltrate, pleural effusion or pneumothorax. Minimal central peribronchial thickening. BILATERAL glenohumeral degenerative changes. IMPRESSION: Minimal bibasilar atelectasis. Enlargement cardiac silhouette post MVR. Electronically Signed   By: Lavonia Dana M.D.   On: 05/05/2016 09:03   Dg Chest Port 1 View  Result Date: 05/04/2016 CLINICAL DATA:  Endotracheal tube placement. EXAM: PORTABLE CHEST 1 VIEW COMPARISON:  None. FINDINGS: Endotracheal tube tip projects 5 cm above the carina. There changes from cardiac surgery and valve replacement. Cardiac silhouette is mildly enlarged. Aorta is uncoiled and prominent. No mediastinal or hilar masses or convincing adenopathy. Lungs are clear.  No pleural effusion or pneumothorax. Skeletal structures are demineralized. There are arthropathic changes of the right glenohumeral joint and significant narrowing of the subacromial space consistent with chronic full-thickness rotator cuff tear. IMPRESSION: 1. No acute cardiopulmonary disease. 2. Endotracheal tube tip projects 5 cm above the carina. Electronically Signed   By: Lajean Manes M.D.   On: 05/04/2016 16:52   Mr Jodene Nam Head/brain [id number] Cm  Result Date: 05/05/2016 CLINICAL DATA:  Left-sided hemi paresis. EXAM: MRI HEAD WITHOUT CONTRAST MRA HEAD WITHOUT CONTRAST TECHNIQUE: Multiplanar, multiecho pulse sequences  of the brain and surrounding structures were obtained without intravenous contrast. Angiographic images of the head were obtained using MRA technique without contrast. COMPARISON:  Head CT 05/04/2016 FINDINGS: MRI HEAD FINDINGS Brain: There is focal diffusion restriction within the right caudate head. Gradient echo imaging shows focal susceptibility within the right basal ganglia in the distribution of the hyperdensity demonstrated on the recent head CT. The midline structures are normal. There is multifocal hyperintense T2 weighted signal within the subcortical white matter.  There is mild mass effect on the frontal horn of the right lateral ventricle. No midline shift or evidence of herniation. No hydrocephalus. Vascular: As above, focal susceptibility within the right basal ganglia. No evidence of chronic microhemorrhage or amyloid angiopathy. Skull and upper cervical spine: The visualized skull base, calvarium, upper cervical spine and extracranial soft tissues are normal. Sinuses/Orbits: Fluid within the sphenoid sinuses. Maxillary mucosal thickening. Bilateral lens replacements. MRA HEAD FINDINGS Intracranial internal carotid arteries: Normal. Anterior cerebral arteries: Normal. Middle cerebral arteries: Normal. Posterior communicating arteries: Not visualized. Posterior cerebral arteries: Normal. Basilar artery: Normal. Vertebral arteries: Codominant. Normal. Superior cerebellar arteries: Normal. Anterior inferior cerebellar arteries: Normal on the right. Not visualized on the left. Posterior inferior cerebellar arteries: Normal. IMPRESSION: 1. Acute infarct of the right caudate head. No midline shift or significant mass effect. 2. Focal susceptibility within the right basal ganglia in the location of intraparenchymal hyperattenuation seen on the prior head CT. This is likely due to the presence of deoxyhemoglobin, favoring petechial hemorrhage over contrast staining. Short interval follow-up head CT is recommended, as contrast extravasation may be expected to clear 24 hours following angiography. 3. Normal MRA of the circle of Willis. Electronically Signed   By: Ulyses Jarred M.D.   On: 05/05/2016 04:33   Ct Head Code Stroke Wo Contrast`  Result Date: 05/04/2016 CLINICAL DATA:  Code stroke. Follow-up exam following right common carotid arteriogram with revascularization of occluded right M1 segment. EXAM: CT HEAD WITHOUT CONTRAST TECHNIQUE: Contiguous axial images were obtained from the base of the skull through the vertex without intravenous contrast. COMPARISON:  Prior  studies from earlier the same day. FINDINGS: Blush of hyperdensity involves the right basal ganglia act, specifically the right caudate head and lentiform nucleus. Finding most consistent with contrast staining from recent catheter directed arteriogram rather than hemorrhage. IV contrast within the vascular structures of the brain noted. No definite acute intracranial hemorrhage. Gray-white matter differentiation otherwise maintained at this time without definite evidence for evolving ischemia. Stable atrophy with chronic microvascular ischemic disease. No mass lesion or midline shift. No mass effect. No hydrocephalus. No extra-axial fluid collection. Scalp soft tissues within normal limits. Globes and orbits demonstrate no acute abnormality. Patient status post lens extraction bilaterally. Scattered mucosal thickening throughout the maxillary sinuses and ethmoidal air cells as well as the sphenoid sinuses. Small fluid levels within the sphenoid sinuses present. No mastoid effusion. Calvarium intact. IMPRESSION: 1. Mild blush of hyperdensity involving the right basal ganglia, most consistent with contrast staining status post recent catheter directed arteriogram. No definite acute intracranial hemorrhage identified, although close interval follow-up is warranted. 2. No other significant evolving right MCA territory infarct identified at this time. 3. No other new acute intracranial process. Electronically Signed   By: Jeannine Boga M.D.   On: 05/04/2016 14:35    STUDIES:  CT perfusion 9/14 > R MCA distribution ischemia. Echo 9/15 > MRI 9/15 >  CULTURES: None.  ANTIBIOTICS: None.  SIGNIFICANT EVENTS: 9/14 > admitted with right MCA  ischemia > taken to IR for neuro intervention.  LINES/TUBES: ETT 9/14 >  DISCUSSION: 82 y.o. male admitted 9/14 with right MCA ischemia for which he was taken to IR where he had clot retrieval.  He returned to the ICU and remained ventilated.  PCCM was asked to  assist with vent management.  ASSESSMENT / PLAN:  NEUROLOGIC A:   Acute right MCA ischemia - s/p neuro IR with clot retrieval. Acute encephalopathy - due to above + sedation. Hx TIA. P:   Neuro following, stroke workup / management per them. Sedation:  Fentanyl gtt / Midazolam PRN. RASS goal: 0 to -1. Daily WUA. Hold preadmission alprazolam.  PULMONARY A: VDRF - in the setting of acute right MCA ischemia. P:   Full vent support. Wean as able. VAP prevention measures. SBT in AM if able with goal extubation once sheath removed CXR in AM.  CARDIOVASCULAR A:  Hx HTN, A.flutter (on warfarin). P:  Monitor hemodynamics. Goal SBP 110 - 130. Defer heparin to neuro (in lieu of preadmission warfarin). Hold preadmission lotensin, doxazosin metoprolol, warfarin.  RENAL A:   Hypocalcemia. Hypokalemia Hypomagnesia  P:   NS @ 100. Follow lytes 9/15 kphos iv 20 mm  GASTROINTESTINAL A:   GI prophylaxis. Nutrition. Hx GI bleed. P:   SUP: Pantoprazole. NPO.  HEMATOLOGIC Lab Results  Component Value Date   INR 2.88 05/05/2016   INR 2.45 05/04/2016   INR 2.8 04/28/2016   PROTIME 22.5 02/03/2009    A:   Chronic anticoagulation due to A.fib (Warfarin). VTE Prophylaxis. P:  SCD's. Defer heparin to neuro (in lieu of preadmission warfarin). CBC in AM.  INFECTIOUS A:   No indication of infection. P:   Monitor clinically.  ENDOCRINE A:   No acute issues.   P:   Follow glucose   Family updated: 9/15 no family at [place] Meeting:  Due by: 9/20.  CC time: 35 minutes.   Richardson Landry Minor ACNP Maryanna Shape PCCM Pager [phone] till 3 pm If no answer page [phone] 05/05/2016, 10:57 AM   STAFF NOTE: I, Merrie Roof, MD FACP have personally reviewed patient's available data, including medical history, events of note, physical examination and test results as part of my evaluation. I have discussed with resident/NP  and other care providers such as pharmacist, RN and RRT. In addition, I personally evaluated patient and elicited key findings of: awake, alert, follow commands, MRi reviewed, on min suport, need sheeth out then weaning aggressive for extubation as goal, wean cpap 5 ps 5, goal 30 min , assess rsbi and airway protection, k supp, reduce any sedation, Tv looks great on wean , will need slp etc, goal is extubation The patient is critically ill with multiple organ systems failure and requires high complexity decision making for assessment and support, frequent evaluation and titration of therapies, application of advanced monitoring technologies and extensive interpretation of multiple databases.   Critical Care Time devoted to patient care services described in this note is 30 Minutes. This time reflects time of care of this signee: Merrie Roof, MD FACP. This critical care time does not reflect procedure time, or teaching time or supervisory time of PA/NP/Med student/Med Resident etc but could involve care discussion time. Rest per NP/medical resident whose note is outlined above and that I agree with   Lavon Paganini. Titus Mould, MD, [place] Pgr: [facility] 05/05/2016 12:17 PM

## 2016-05-05 NOTE — Progress Notes (Signed)
05/05/16 Neuro Interventional Radiology Rt 7 french femoral sheath removed at 1510.  Upon removal, the sheath was noted to be kinked.  Exoseal closure device and manual pressure applied to achieve hemostasis at 1530.  Distal pulses intact.  Site reviewed with Kaleen Odea.  Pt acquired a hand sized hematoma.  The site was marked and is soft.  Sandbag applied to site.

## 2016-05-05 NOTE — Progress Notes (Signed)
STROKE TEAM PROGRESS NOTE   HISTORY OF PRESENT ILLNESS (per record) Brian [name] is an 82 y.o. male with past medical history of TIA, hypertension, atrial flutter. Patient was brought to the emergency department as code stroke after he was found down in the kitchen with left-sided hemiparesis. Patient's last known normal was 6:15 PM the prior night. Per son as 6:15 this morning patient called out to him and he was found on the floor not moving his left side. Patient was brought in for immediate CT scan which showed a right hyperdense MCA. At that time patient was considered to be a possible wake up stroke. Patient was brought for immediate CT angiogram and perfusion. CT perfusion showed significant amount of viable tissue. For this reason he was brought to intervention radiology.  Date last known well: Date: 05/03/2016 Time last known well: Time: 18:15 tPA Given: No: out of window   Cerebral Angiogram Rt common carotid arteriogram,followed by complete angiographic revascularization of occluded RT MCA M1 seg using x1 pass with solitaire 71mmx 40 mm retrieval device and 3.6 mg of superselective IA integrelin achieving a TICI 2b/TICI 3 reperfusion   SUBJECTIVE (INTERVAL HISTORY) His wife and daughter are at the bedside. I also talked to his son-in-law who is a critical care physician in [place]. Overall he feels his condition is rapidly improving. He is still intubated but able to follow all commands. His INR is morning 2.88. She still not removed. MRI showed right basal ganglia small infarct with hemorrhagic transformation. Will need INR reversal to avoid further hemorrhagic transformation.   OBJECTIVE Temp:  [97.7 F (36.5 C)-99.6 F (37.6 C)] 99.6 F (37.6 C) (09/15 0800) Pulse Rate:  [51-140] 86 (09/15 1000) Cardiac Rhythm: Atrial flutter (09/15 0800) Resp:  [10-23] 19 (09/15 1000) BP: (79-134)/(37-83) 119/62 (09/15 1000) SpO2:  [97 %-100 %] 100 % (09/15 1000) Arterial Line BP:  (80-167)/(35-66) 144/55 (09/15 1000) FiO2 (%):  [40 %-50 %] 40 % (09/15 0830) Weight:  [71.7 kg (158 lb 1.1 oz)] 71.7 kg (158 lb 1.1 oz) (09/14 1800)  CBC:   Recent Labs Lab 05/04/16 0920 05/04/16 0934 05/05/16 0500  WBC 9.3  --  6.8  NEUTROABS 7.9*  --  5.0  HGB 13.7 14.6 11.3*  HCT 41.4 43.0 34.4*  MCV 90.2  --  90.1  PLT 141*  --  128*    Basic Metabolic Panel:   Recent Labs Lab 05/04/16 0920 05/04/16 0934 05/05/16 0500  NA 140 140 141  K 3.7 3.6 3.2*  CL 106 101 113*  CO2 26  --  24  GLUCOSE 113* 108* 88  BUN 17 20 9   CREATININE 0.78 0.80 0.66  CALCIUM 9.0  --  8.2*  MG  --   --  1.6*  PHOS  --   --  2.3*    Lipid Panel:     Component Value Date/Time   CHOL 114 05/05/2016 0500   TRIG 54 05/05/2016 0500   HDL 41 05/05/2016 0500   CHOLHDL 2.8 05/05/2016 0500   VLDL 11 05/05/2016 0500   LDLCALC 62 05/05/2016 0500   HgbA1c:  Lab Results  Component Value Date   HGBA1C 5.3 05/15/2011   Urine Drug Screen: No results found for: LABOPIA, COCAINSCRNUR, LABBENZ, AMPHETMU, THCU, LABBARB    IMAGING I have personally reviewed the radiological images below and agree with the radiology interpretations.  Ct Angio Head and Neck and CT perfusion 05/04/2016 Thrombus with occlusion of the right carotid terminus with  poor flow to the right middle cerebral artery branches (collateral flow to distal right middle cerebral artery branches). This is causing decreased caliber of the right internal carotid artery petrous and cavernous segment without high-grade proximal stenosis.  Large area of ischemia involving the right middle cerebral artery distribution.   Ct Head Wo Contrast 05/04/2016 HEAD CT:  1. Hyperdense right middle cerebral artery sign accompanied by subtle decreased attenuation along the right insular cortex. This suggests a recent right middle cerebral artery infarct due to an M1 segment embolus/thrombus. Recommend follow-up brain MRI.  2. No other acute  intracranial abnormalities. No intracranial hemorrhage.   Mr Brian [name] Head/brain Wo Cm 05/05/2016 1. Acute infarct of the right caudate head. No midline shift or significant mass effect.  2. Focal susceptibility within the right basal ganglia in the location of intraparenchymal hyperattenuation seen on the prior head CT. This is likely due to the presence of deoxyhemoglobin, favoring petechial hemorrhage over contrast staining.  3. Normal MRA of the circle of Willis.   Dg Chest Port 1 View 05/05/2016 Minimal bibasilar atelectasis. Enlargement cardiac silhouette post MVR. Electronically Signed     Ct Head Code Stroke Wo Contrast` 05/04/2016 1. Mild blush of hyperdensity involving the right basal ganglia, most consistent with contrast staining status post recent catheter directed arteriogram.  No definite acute intracranial hemorrhage identified, although close interval follow-up is warranted. 2. No other significant evolving right MCA territory infarct identified at this time. 3. No other new acute intracranial process.   2-D Echo pending   PHYSICAL EXAM  Temp:  [98.2 F (36.8 C)-99.6 F (37.6 C)] 99.3 F (37.4 C) (09/15 1200) Pulse Rate:  [62-140] 80 (09/15 1600) Resp:  [10-23] 14 (09/15 1600) BP: (93-134)/(45-76) 115/70 (09/15 1600) SpO2:  [100 %] 100 % (09/15 1600) Arterial Line BP: (99-167)/(39-66) 132/57 (09/15 1600) FiO2 (%):  [40 %] 40 % (09/15 1539) Weight:  [71.7 kg (158 lb 1.1 oz)] 71.7 kg (158 lb 1.1 oz) (09/14 1800)  General - Well nourished, well developed, intubated.  Ophthalmologic - Fundi not visualized.  Cardiovascular - irregularly irregular heart rate and rhythm.  Neuro - awake, alert, intubated, eyes open on voice, follows all central and peripheral commands. PERRL, blinking to visual threat bilaterally, positive corneal and gag reflexes. RUE and RLE 5/5, LLE 5/5 and LUE 4/5 with 3/5 finger grasp and difficulty with dexterity. Sensation symmetrical bilaterally,  FTN intact bilaterally although slow on the left side. DTR 1+ and no Babinski. Gait not tested    ASSESSMENT/PLAN [name] is a 82 y.o. male with history of previous TIA, history of venous thrombosis and embolism, hypertension, previous GI bleed, epistaxis, anemia and atrial flutter with warfarin therapy ( INR 2.8 at time of admission) presenting with left hemiparesis. He did not receive IV t-PA due to late presentation and warfarin.  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 fib  Resultant  intubated, right upper extremity mild paresis  MRI - Acute infarct of the right BG with petechiae hemorrhagic transformation.  MRA - Normal MRA of the circle of Willis.   CTA head and neck - right ICA terminus occlusion  Cerebral angiogram - complete revascularization of occluded right MCA, TICI 2b/TICI 3 reperfusion  2D Echo - pending   LDL - 62  HgbA1c pending  VTE prophylaxis - SCDs and INR 2.88 Diet NPO time specified  warfarin daily prior to admission, currently Coumadin on hold but INR  2.88. Due to hemorrhagic transformation with high INR, will use Kcentra to reverse INR to avoid further hemorrhagic transformation.  Removal sheath after INR normalized  Hopefully extubate after sheath removal  Once INR normalizes, start aspirin for stroke prevention and subcutaneous heparin for DVT prophylaxis.  Repeat CT in a.m., if no frank hematoma, may consider to start DOACs for stroke prevention.   Plan discussed with daughter at bedside and son-in-law over the phone who is a critical physician in [place]. They are in agreement.  Patient counseled to be compliant with his antithrombotic medications  Ongoing aggressive stroke risk factor management  Therapy recommendations: pending  Disposition: pending  Persistent A. fib on Coumadin  INR on admission 2.8  Coumadin on hold, however, INR in a.m.  2.88  Reversal with Kcentra to avoid further hemorrhagic transformation  Repeat CT in a.m.  May consider DOACs if no frank hematoma  History of GI bleeding  Has been years ago  No recent GI bleeding on Coumadin  Hb 13.7 -> 11.3  Close monitoring   CBC in a.m.  Hypertension  Blood pressure running somewhat low  Permissive hypertension (OK if < 180/105 due to high INR) but gradually normalize in 5-7 days  Long-term BP goal normotensive  Hyperlipidemia  Home meds:  No lipid lowering medications prior to admission  LDL 62, goal < 70  Other Stroke Risk Factors  Advanced age  Cigarette smoker - already quit  Hx of TIA  Other Active Problems  Mild thrombocytopenia, platelet 128  Hypokalemia - 3.2, supplement  Hospital day # 1  This patient is critically ill due to right MCA occlusion s/p IR, A. fib on Coumadin with therapeutic INR, history of GI bleeding, and at significant risk of neurological worsening, death form hemorrhagic transformation, recurrent stroke, respiratory failure, heart failure, further GI bleeding. This patient's care requires constant monitoring of vital signs, hemodynamics, respiratory and cardiac monitoring, review of multiple databases, neurological assessment, discussion with family, other specialists and medical decision making of high complexity. I spent 50 minutes of neurocritical care time in the care of this patient. I had long discussion with daughter at bedside and son-in-law over the phone, updating patient condition, treatment plan and possible prognosis.  Rosalin Hawking, MD PhD Stroke Neurology 05/05/2016 5:37 PM    To contact Stroke Continuity provider, please refer to [email or web address] After hours, contact General Neurology

## 2016-05-05 NOTE — Progress Notes (Signed)
Initial Nutrition Assessment  DOCUMENTATION CODES:   Not applicable  INTERVENTION:  If pt unable to be extubated, recommend TF with Vital AF 1.2 at goal rate of 55 ml/h (1320 ml per day) to provide 1584 kcals, 99 gm protein, 1069 ml free water daily.  NUTRITION DIAGNOSIS:   Inadequate oral intake related to inability to eat as evidenced by NPO status.  GOAL:   Patient will meet greater than or equal to 90% of their needs  MONITOR:   Vent status, Labs, Weight trends, Skin, I & O's  REASON FOR ASSESSMENT:   Ventilator    ASSESSMENT:   82 y.o. male admitted 9/14 with right MCA ischemia for which he was taken to IR where he had clot retrieval.  He returned to the ICU and remained ventilated.  Patient is currently intubated on ventilator support MV: 7 L/min Temp (24hrs), Avg:98.9 F (37.2 C), Min:98 F (36.7 C), Max:99.6 F (37.6 C)  Propofol: none  Per MD note, plans for extubation today after sheath removal. Per IR, Rt 7 french femoral sheath removed at 1510.   Pt awake and alert on vent. Family at bedside. Daughter reports pt was doing well PTA. Family reports pt is looking forward to a diet post extubation. Pt with no significant weight loss per weight records. Nutrition-Focused physical exam completed. Findings are moderate fat depletion, moderate muscle depletion, and no edema. Depletion may be related to the natural aging process.    Tube feeding recommendations have been stated above if pt is unable to be extubated.   Labs and medications reviewed. Potassium at 3.2. Phosphorous at 2.3. Magnesium at 1.6.  Diet Order:  Diet NPO time specified  Skin:   (Incision R groin)  Last BM:  Unknown  Height:   Ht Readings from Last 1 Encounters:  05/04/16 5\' 7"  (1.702 m)    Weight:   Wt Readings from Last 1 Encounters:  05/04/16 158 lb 1.1 oz (71.7 kg)    Ideal Body Weight:  67.27 kg  BMI:  Body mass index is 24.76 kg/m.  Estimated Nutritional Needs:    Kcal:  [id number]  Protein:  90-105 grams  Fluid:  >/= 1.5 L/day  EDUCATION NEEDS:   No education needs identified at this time  Corrin Parker, MS, RD, LDN Pager # [phone] After hours/ weekend pager # [phone]

## 2016-05-06 ENCOUNTER — Inpatient Hospital Stay (HOSPITAL_COMMUNITY): Payer: Medicare Other

## 2016-05-06 DIAGNOSIS — I6789 Other cerebrovascular disease: Secondary | ICD-10-CM

## 2016-05-06 LAB — ECHOCARDIOGRAM COMPLETE
HEIGHTINCHES: 67 in
WEIGHTICAEL: 2529.12 [oz_av]

## 2016-05-06 LAB — CBC
HEMATOCRIT: 34 % — AB (ref 39.0–52.0)
HEMOGLOBIN: 10.8 g/dL — AB (ref 13.0–17.0)
MCH: 29.1 pg (ref 26.0–34.0)
MCHC: 31.8 g/dL (ref 30.0–36.0)
MCV: 91.6 fL (ref 78.0–100.0)
Platelets: 124 10*3/uL — ABNORMAL LOW (ref 150–400)
RBC: 3.71 MIL/uL — ABNORMAL LOW (ref 4.22–5.81)
RDW: 15 % (ref 11.5–15.5)
WBC: 6.9 10*3/uL (ref 4.0–10.5)

## 2016-05-06 LAB — BASIC METABOLIC PANEL
Anion gap: 6 (ref 5–15)
BUN: 10 mg/dL (ref 6–20)
CALCIUM: 8.2 mg/dL — AB (ref 8.9–10.3)
CHLORIDE: 112 mmol/L — AB (ref 101–111)
CO2: 24 mmol/L (ref 22–32)
CREATININE: 0.54 mg/dL — AB (ref 0.61–1.24)
GFR calc non Af Amer: >60 mL/min (ref >60)
GLUCOSE: 111 mg/dL — AB (ref 65–99)
Potassium: 3.3 mmol/L — ABNORMAL LOW (ref 3.5–5.1)
Sodium: 142 mmol/L (ref 135–145)

## 2016-05-06 LAB — HEMOGLOBIN A1C
HEMOGLOBIN A1C: 5.3 % (ref 4.8–5.6)
MEAN PLASMA GLUCOSE: 105 mg/dL

## 2016-05-06 LAB — MAGNESIUM: Magnesium: 1.6 mg/dL — ABNORMAL LOW (ref 1.7–2.4)

## 2016-05-06 LAB — PROTIME-INR
INR: 1.32
INR: 1.3
INR: 1.23
PROTHROMBIN TIME: 16.5 s — AB (ref 11.4–15.2)
Prothrombin Time: 16.3 seconds — ABNORMAL HIGH (ref 11.4–15.2)
Prothrombin Time: 15.6 seconds — ABNORMAL HIGH (ref 11.4–15.2)

## 2016-05-06 LAB — PHOSPHORUS: Phosphorus: 2 mg/dL — ABNORMAL LOW (ref 2.5–4.6)

## 2016-05-06 MED ORDER — MAGNESIUM SULFATE 2 GM/50ML IV SOLN
2.0000 g | Freq: Once | INTRAVENOUS | Status: AC
  Administered 2016-05-06: 2 g via INTRAVENOUS
  Filled 2016-05-06: qty 50

## 2016-05-06 MED ORDER — ALPRAZOLAM 0.25 MG PO TABS
0.2500 mg | ORAL_TABLET | Freq: Every day | ORAL | Status: DC
  Administered 2016-05-07 – 2016-05-09 (×3): 0.25 mg via ORAL
  Filled 2016-05-06 (×3): qty 1

## 2016-05-06 MED ORDER — POTASSIUM CHLORIDE 20 MEQ/15ML (10%) PO SOLN
20.0000 meq | ORAL | Status: AC
  Administered 2016-05-06 (×2): 20 meq
  Filled 2016-05-06 (×2): qty 15

## 2016-05-06 MED ORDER — SODIUM PHOSPHATES 45 MMOLE/15ML IV SOLN
10.0000 mmol | Freq: Once | INTRAVENOUS | Status: AC
  Administered 2016-05-06: 10 mmol via INTRAVENOUS
  Filled 2016-05-06: qty 3.33

## 2016-05-06 NOTE — Progress Notes (Signed)
Patient ID: Brian [name], male   DOB: 1932/12/23, 82 y.o.   MRN: [id number]    Referring Physician(s): Dr. Roland Rack  Supervising Physician: [name]  Patient Status: inpt  Chief Complaint: (R) MCA CVA  Subjective: Patient just extubated while I was in the room.  C/o sore throat, but no other complaints.  Allergies: Novocain [procaine hcl] and Penicillins  Medications: Prior to Admission medications   Medication Sig Start Date End Date Taking? Authorizing Provider  ALPRAZolam (XANAX) 0.25 MG tablet Take 1 tablet (0.25 mg total) by mouth at bedtime. 03/22/16  Yes Marletta Lor, MD  benazepril-hydrochlorthiazide (LOTENSIN HCT) 5-6.25 MG tablet Take 1 tablet by mouth daily. 03/22/16  Yes Marletta Lor, MD  clindamycin (CLEOCIN) 150 MG capsule TAKE 2 CAPSULES BY MOUTH PRIOR TO DENTAL WORK AS DIRECTED 11/10/15  Yes Marletta Lor, MD  doxazosin (CARDURA) 1 MG tablet TAKE 1 TABLET BY MOUTH AT BEDTIME 03/22/16  Yes Marletta Lor, MD  doxycycline (VIBRA-TABS) 100 MG tablet TAKE 1 TABLET BY MOUTH TWICE DAILY 03/08/16  Yes Marletta Lor, MD  finasteride (PROSCAR) 5 MG tablet Take 1 tablet (5 mg total) by mouth daily. 03/22/16  Yes Marletta Lor, MD  metoprolol succinate (TOPROL-XL) 25 MG 24 hr tablet TAKE 1 TABLET BY MOUTH EVERY MORNING AND 2 TABLETS BY MOUTH EVERY EVENING 03/22/16  Yes Marletta Lor, MD  pantoprazole (PROTONIX) 40 MG tablet Take 1 tablet (40 mg total) by mouth daily. 03/22/16  Yes Marletta Lor, MD  PROCTOZONE-HC 2.5 % rectal cream USE RECTALLY TWICE DAILY 08/04/15  Yes Marletta Lor, MD  saw palmetto 160 MG capsule Take 1 capsule (160 mg total) by mouth 2 (two) times daily. 01/26/14  Yes Ricard Dillon, MD  warfarin (COUMADIN) 5 MG tablet Take 1 tablet by mouth daily or as directed. 03/22/16  Yes Marletta Lor, MD    Vital Signs: BP 120/65 (BP Location: Right Arm)   Pulse 86   Temp 99.1 F (37.3 C) (Oral)    Resp (!) 27   Ht 5\' 7"  (1.702 m)   Wt 158 lb 1.1 oz (71.7 kg)   SpO2 100%   BMI 24.76 kg/m   Physical Exam: NEuro: left-sided facial droop, unable to smile evenly.  No ability to grip in LUE.  Good grip strength on right.  Good strength of BLE.  Bilateral pedal pulses bounding Skin: right inguinal site with hematoma/ecchymosis.  This appears relatively stable.  Imaging: Ct Angio Head W Or Wo Contrast  Result Date: 05/04/2016 CLINICAL DATA:  82 year old male with left-sided weakness and facial droop. Subsequent encounter. EXAM: CT ANGIOGRAPHY HEAD AND NECK.  CT PERFUSION (RAPID). TECHNIQUE: Multidetector CT imaging of the head and neck was performed using the standard protocol during bolus administration of intravenous contrast. Multiplanar CT image reconstructions and MIPs were obtained to evaluate the vascular anatomy. Carotid stenosis measurements (when applicable) are obtained utilizing NASCET criteria, using the distal internal carotid diameter as the denominator. CONTRAST:  100 cc Omnipaque 370. COMPARISON:  05/04/2016 CT head and cervical spine CT. FINDINGS: CT HEAD RAPID. PERFUSION: Large right middle cerebral artery distribution decreased profusion with small areas of possible infarction. RAPID findings with T-max greater than 6 seconds of 177 cc and CBF less than 30% of 0 cc. Overall mismatch volume of 177 cc. Brain: Large right middle cerebral artery distribution region of ischemia. No intracranial hemorrhage. No intracranial mass. Calvarium and skull base: No acute abnormality. Paranasal sinuses:  Partial opacification left maxillary sinus and mucosal thickening/partial opacification ethmoid sinus air cells bilaterally. Orbits: No acute abnormality. CTA NECK Aortic arch: 3 vessel arch. Plaque right innominate artery origin without significant stenosis. Minimal plaque left subclavian artery origin. Right carotid system: Plaque right carotid bifurcation without significant stenosis. Ectatic  distal cervical segment. Decreased flow proximal to the skullbase. Left carotid system: Plaque left carotid bifurcation without hemodynamically significant stenosis. Ectatic distal cervical segment. Vertebral arteries:Mild narrowing origin left vertebral artery. Right vertebral artery slightly dominant in size. Skeleton: Cervical spondylotic changes. Other neck: No worrisome neck mass. Upper chest: No worrisome lung apical mass. CTA HEAD Anterior circulation: Decreased flow within the right internal carotid artery cavernous segment. This is most likely related to thrombus in the right carotid terminus with poor flow to right middle cerebral artery branches (collateral flow noted along the periphery of the right middle cerebral artery distal branches). No significant stenosis left internal carotid artery, left middle cerebral artery or left anterior cerebral artery. Posterior circulation: No significant stenosis vertebral arteries or basilar artery. Venous sinuses: Patent Anatomic variants: Negative. Delayed phase: As above. 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 This is causing decreased caliber of the right internal carotid artery petrous and cavernous segment without high-grade proximal stenosis. Large area of ischemia involving the right middle cerebral artery distribution. Findings suggest significant reversible component. Images reviewed with Dr. Leonel Ramsay at times imaging. Electronically Signed   By: Genia Del M.D.   On: 05/04/2016 10:51   Ct Head Wo Contrast  Result Date: 05/06/2016 CLINICAL DATA:  82 y/o  M; follow-up of hemorrhage. EXAM: CT HEAD WITHOUT CONTRAST TECHNIQUE: Contiguous axial images were obtained from the base of the skull through the vertex without intravenous contrast. COMPARISON:  05/05/2016 MR brain.  05/04/2016 CT head.  FINDINGS: Brain: Stable focus of hemorrhage in the right basal ganglia with mild surrounding vasogenic edema and local mass effect centered in a small area of infarction better characterized on the prior MRI head. No evidence for new large territory infarct or intracranial hemorrhage. Background mild chronic microvascular ischemic changes and parenchymal volume loss. Vascular: No hyperdense vessel or unexpected calcification. Skull: Normal. Negative for fracture or focal lesion. Sinuses/Orbits: Left maxillary sinus mucous retention cyst. Fluid levels in the sphenoid sinuses is probably due to intubation. Normally aerated mastoid air cells. Bilateral intra-ocular lens replacement. Other: None. IMPRESSION: Small hemorrhage within the right basal ganglia region of infarction is stable in comparison with the prior MRI given differences in technique. No new hemorrhage or large territory infarct is identified. Electronically Signed   By: Kristine Garbe M.D.   On: 05/06/2016 05:18   Ct Head Wo Contrast  Result Date: 05/04/2016 CLINICAL DATA:  Pt was found down on floor, pt with left sided neglect and facial droop, pt was last seen normal at 2200 last night EXAM: CT HEAD WITHOUT CONTRAST CT CERVICAL SPINE WITHOUT CONTRAST TECHNIQUE: Multidetector CT imaging of the head and cervical spine was performed following the standard protocol without intravenous contrast. Multiplanar CT image reconstructions of the cervical spine were also generated. COMPARISON:  None. FINDINGS: CT HEAD FINDINGS Brain: The ventricles are normal in size, for this patient's age, and normal in configuration. There are no parenchymal masses or mass effect. There is subtle decreased attenuation along the right insular cortex. No other parenchymal evidence of a recent infarct. No extra-axial masses  or abnormal fluid collections. No intracranial hemorrhage. Vascular: There is a dense M1 segment of the right middle cerebral artery concerning  for thrombus. Skull: No skull fracture or lesion. Sinuses/Orbits: Mild left maxillary mucosal thickening. Moderate ethmoid sinus mucosal thickening. Mild sphenoid and inferior frontal sinus mucosal thickening. Globes and orbits are unremarkable other than changes from cataract surgery. Other: Clear mastoid air cells. CT CERVICAL SPINE FINDINGS Alignment: Normal. Skull base and vertebrae: Skullbase alignment with the cervical spine is normal. No fractures. No bone lesions. Bones are diffusely demineralized. Soft tissues and spinal canal: No soft tissue masses or adenopathy. There are carotid artery vascular calcifications. Disc levels: There is loss of disc height with endplate sclerosis and spurring at all levels ranging from moderate to severe, greatest at C7-T1. Uncovertebral spurring leads to varying degrees of neural foraminal narrowing, which appears greatest on the left at C4-C5, moderate severity. Upper chest: Lung apices are clear. Other: None IMPRESSION: HEAD CT: 1. Hyperdense right middle cerebral artery sign accompanied by subtle decreased attenuation along the right insular cortex. This suggests a recent right middle cerebral artery infarct due to an M1 segment embolus/thrombus. Recommend follow-up brain MRI. 2. No other acute intracranial abnormalities. No intracranial hemorrhage. CERVICAL CT: 1. No fracture, spondylolisthesis or acute finding. Electronically Signed   By: Lajean Manes M.D.   On: 05/04/2016 09:18   Ct Angio Neck W Or Wo Contrast  Result Date: 05/04/2016 CLINICAL DATA:  82 year old male with left-sided weakness and facial droop. Subsequent encounter. EXAM: CT ANGIOGRAPHY HEAD AND NECK.  CT PERFUSION (RAPID). TECHNIQUE: Multidetector CT imaging of the head and neck was performed using the standard protocol during bolus administration of intravenous contrast. Multiplanar CT image reconstructions and MIPs were obtained to evaluate the vascular anatomy. Carotid stenosis measurements  (when applicable) are obtained utilizing NASCET criteria, using the distal internal carotid diameter as the denominator. CONTRAST:  100 cc Omnipaque 370. COMPARISON:  05/04/2016 CT head and cervical spine CT. FINDINGS: CT HEAD RAPID. PERFUSION: Large right middle cerebral artery distribution decreased profusion with small areas of possible infarction. RAPID findings with T-max greater than 6 seconds of 177 cc and CBF less than 30% of 0 cc. Overall mismatch volume of 177 cc. Brain: Large right middle cerebral artery distribution region of ischemia. No intracranial hemorrhage. No intracranial mass. Calvarium and skull base: No acute abnormality. Paranasal sinuses: Partial opacification left maxillary sinus and mucosal thickening/partial opacification ethmoid sinus air cells bilaterally. Orbits: No acute abnormality. CTA NECK Aortic arch: 3 vessel arch. Plaque right innominate artery origin without significant stenosis. Minimal plaque left subclavian artery origin. Right carotid system: Plaque right carotid bifurcation without significant stenosis. Ectatic distal cervical segment. Decreased flow proximal to the skullbase. Left carotid system: Plaque left carotid bifurcation without hemodynamically significant stenosis. Ectatic distal cervical segment. Vertebral arteries:Mild narrowing origin left vertebral artery. Right vertebral artery slightly dominant in size. Skeleton: Cervical spondylotic changes. Other neck: No worrisome neck mass. Upper chest: No worrisome lung apical mass. CTA HEAD Anterior circulation: Decreased flow within the right internal carotid artery cavernous segment. This is most likely related to thrombus in the right carotid terminus with poor flow to right middle cerebral artery branches (collateral flow noted along the periphery of the right middle cerebral artery distal branches). No significant stenosis left internal carotid artery, left middle cerebral artery or left anterior cerebral artery.  Posterior circulation: No significant stenosis vertebral arteries or basilar artery. Venous sinuses: Patent Anatomic variants: Negative. Delayed phase: As above. IMPRESSION: Thrombus with  occlusion of the right carotid terminus with poor flow to the right middle cerebral artery branches (collateral flow to distal right middle cerebral artery branches). This is causing decreased caliber of the right internal carotid artery petrous and cavernous segment without high-grade proximal stenosis. Large area of ischemia involving the right middle cerebral artery distribution. Findings suggest significant reversible component. Images reviewed with Dr. Leonel Ramsay at times imaging. Electronically Signed   By: Genia Del M.D.   On: 05/04/2016 10:51   Ct Cervical Spine Wo Contrast  Result Date: 05/04/2016 CLINICAL DATA:  Pt was found down on floor, pt with left sided neglect and facial droop, pt was last seen normal at 2200 last night EXAM: CT HEAD WITHOUT CONTRAST CT CERVICAL SPINE WITHOUT CONTRAST TECHNIQUE: Multidetector CT imaging of the head and cervical spine was performed following the standard protocol without intravenous contrast. Multiplanar CT image reconstructions of the cervical spine were also generated. COMPARISON:  None. FINDINGS: CT HEAD FINDINGS Brain: The ventricles are normal in size, for this patient's age, and normal in configuration. There are no parenchymal masses or mass effect. There is subtle decreased attenuation along the right insular cortex. No other parenchymal evidence of a recent infarct. No extra-axial masses or abnormal fluid collections. No intracranial hemorrhage. Vascular: There is a dense M1 segment of the right middle cerebral artery concerning for thrombus. Skull: No skull fracture or lesion. Sinuses/Orbits: Mild left maxillary mucosal thickening. Moderate ethmoid sinus mucosal thickening. Mild sphenoid and inferior frontal sinus mucosal thickening. Globes and orbits are  unremarkable other than changes from cataract surgery. Other: Clear mastoid air cells. CT CERVICAL SPINE FINDINGS Alignment: Normal. Skull base and vertebrae: Skullbase alignment with the cervical spine is normal. No fractures. No bone lesions. Bones are diffusely demineralized. Soft tissues and spinal canal: No soft tissue masses or adenopathy. There are carotid artery vascular calcifications. Disc levels: There is loss of disc height with endplate sclerosis and spurring at all levels ranging from moderate to severe, greatest at C7-T1. Uncovertebral spurring leads to varying degrees of neural foraminal narrowing, which appears greatest on the left at C4-C5, moderate severity. Upper chest: Lung apices are clear. Other: None IMPRESSION: HEAD CT: 1. Hyperdense right middle cerebral artery sign accompanied by subtle decreased attenuation along the right insular cortex. This suggests a recent right middle cerebral artery infarct due to an M1 segment embolus/thrombus. Recommend follow-up brain MRI. 2. No other acute intracranial abnormalities. No intracranial hemorrhage. CERVICAL CT: 1. No fracture, spondylolisthesis or acute finding. Electronically Signed   By: Lajean Manes M.D.   On: 05/04/2016 09:18   Mr Brain Wo Contrast  Result Date: 05/05/2016 CLINICAL DATA:  Left-sided hemi paresis. EXAM: MRI HEAD WITHOUT CONTRAST MRA HEAD WITHOUT CONTRAST TECHNIQUE: Multiplanar, multiecho pulse sequences of the brain and surrounding structures were obtained without intravenous contrast. Angiographic images of the head were obtained using MRA technique without contrast. COMPARISON:  Head CT 05/04/2016 FINDINGS: MRI HEAD FINDINGS Brain: There is focal diffusion restriction within the right caudate head. Gradient echo imaging shows focal susceptibility within the right basal ganglia in the distribution of the hyperdensity demonstrated on the recent head CT. The midline structures are normal. There is multifocal hyperintense T2  weighted signal within the subcortical white matter. There is mild mass effect on the frontal horn of the right lateral ventricle. No midline shift or evidence of herniation. No hydrocephalus. Vascular: As above, focal susceptibility within the right basal ganglia. No evidence of chronic microhemorrhage or amyloid angiopathy. Skull and upper  cervical spine: The visualized skull base, calvarium, upper cervical spine and extracranial soft tissues are normal. Sinuses/Orbits: Fluid within the sphenoid sinuses. Maxillary mucosal thickening. Bilateral lens replacements. MRA HEAD FINDINGS Intracranial internal carotid arteries: Normal. Anterior cerebral arteries: Normal. Middle cerebral arteries: Normal. Posterior communicating arteries: Not visualized. Posterior cerebral arteries: Normal. Basilar artery: Normal. Vertebral arteries: Codominant. Normal. Superior cerebellar arteries: Normal. Anterior inferior cerebellar arteries: Normal on the right. Not visualized on the left. Posterior inferior cerebellar arteries: Normal. IMPRESSION: 1. Acute infarct of the right caudate head. No midline shift or significant mass effect. 2. Focal susceptibility within the right basal ganglia in the location of intraparenchymal hyperattenuation seen on the prior head CT. This is likely due to the presence of deoxyhemoglobin, favoring petechial hemorrhage over contrast staining. Short interval follow-up head CT is recommended, as contrast extravasation may be expected to clear 24 hours following angiography. 3. Normal MRA of the circle of Willis. Electronically Signed   By: Ulyses Jarred M.D.   On: 05/05/2016 04:33   Ct Cerebral Perfusion W Contrast  Result Date: 05/04/2016 CLINICAL DATA:  82 year old male with left-sided weakness and facial droop. Subsequent encounter. EXAM: CT ANGIOGRAPHY HEAD AND NECK.  CT PERFUSION (RAPID). TECHNIQUE: Multidetector CT imaging of the head and neck was performed using the standard protocol during  bolus administration of intravenous contrast. Multiplanar CT image reconstructions and MIPs were obtained to evaluate the vascular anatomy. Carotid stenosis measurements (when applicable) are obtained utilizing NASCET criteria, using the distal internal carotid diameter as the denominator. CONTRAST:  100 cc Omnipaque 370. COMPARISON:  05/04/2016 CT head and cervical spine CT. FINDINGS: CT HEAD RAPID. PERFUSION: Large right middle cerebral artery distribution decreased profusion with small areas of possible infarction. RAPID findings with T-max greater than 6 seconds of 177 cc and CBF less than 30% of 0 cc. Overall mismatch volume of 177 cc. Brain: Large right middle cerebral artery distribution region of ischemia. No intracranial hemorrhage. No intracranial mass. Calvarium and skull base: No acute abnormality. Paranasal sinuses: Partial opacification left maxillary sinus and mucosal thickening/partial opacification ethmoid sinus air cells bilaterally. Orbits: No acute abnormality. CTA NECK Aortic arch: 3 vessel arch. Plaque right innominate artery origin without significant stenosis. Minimal plaque left subclavian artery origin. Right carotid system: Plaque right carotid bifurcation without significant stenosis. Ectatic distal cervical segment. Decreased flow proximal to the skullbase. Left carotid system: Plaque left carotid bifurcation without hemodynamically significant stenosis. Ectatic distal cervical segment. Vertebral arteries:Mild narrowing origin left vertebral artery. Right vertebral artery slightly dominant in size. Skeleton: Cervical spondylotic changes. Other neck: No worrisome neck mass. Upper chest: No worrisome lung apical mass. CTA HEAD Anterior circulation: Decreased flow within the right internal carotid artery cavernous segment. This is most likely related to thrombus in the right carotid terminus with poor flow to right middle cerebral artery branches (collateral flow noted along the periphery  of the right middle cerebral artery distal branches). No significant stenosis left internal carotid artery, left middle cerebral artery or left anterior cerebral artery. Posterior circulation: No significant stenosis vertebral arteries or basilar artery. Venous sinuses: Patent Anatomic variants: Negative. Delayed phase: As above. 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 This is causing decreased caliber of the right internal carotid artery petrous and cavernous segment without high-grade proximal stenosis. Large area of ischemia involving the right middle cerebral artery distribution. Findings suggest significant reversible component.  Images reviewed with Dr. Leonel Ramsay at times imaging. Electronically Signed   By: Genia Del M.D.   On: 05/04/2016 10:51   Dg Chest Port 1 View  Result Date: 05/06/2016 CLINICAL DATA:  Respiratory failure EXAM: PORTABLE CHEST 1 VIEW COMPARISON:  05/05/2016 FINDINGS: Endotracheal tube unchanged. Introduction of NG tube which extends in the course of the stomach below the level the found. LEFT basilar atelectasis and effusion. No pneumothorax. IMPRESSION: 1. Endotracheal tube unchanged.  Introduction of NG tube. 2. LEFT lower lobe effusion and atelectasis. Electronically Signed   By: Suzy Bouchard M.D.   On: 05/06/2016 08:17   Dg Chest Port 1 View  Result Date: 05/05/2016 CLINICAL DATA:  Intubation, history hypertension, atrial flutter, stroke EXAM: PORTABLE CHEST 1 VIEW COMPARISON:  Portable exam 0619 hours compared to 05/04/2016 FINDINGS: Tip of endotracheal tube projects 6.8 cm above carina. Enlargement of cardiac silhouette post MVR. Mediastinal contours and pulmonary vascularity normal. Minimal bibasilar atelectasis. LEFT costophrenic angle excluded. No gross infiltrate, pleural effusion or pneumothorax. Minimal  central peribronchial thickening. BILATERAL glenohumeral degenerative changes. IMPRESSION: Minimal bibasilar atelectasis. Enlargement cardiac silhouette post MVR. Electronically Signed   By: Lavonia Dana M.D.   On: 05/05/2016 09:03   Dg Chest Port 1 View  Result Date: 05/04/2016 CLINICAL DATA:  Endotracheal tube placement. EXAM: PORTABLE CHEST 1 VIEW COMPARISON:  None. FINDINGS: Endotracheal tube tip projects 5 cm above the carina. There changes from cardiac surgery and valve replacement. Cardiac silhouette is mildly enlarged. Aorta is uncoiled and prominent. No mediastinal or hilar masses or convincing adenopathy. Lungs are clear.  No pleural effusion or pneumothorax. Skeletal structures are demineralized. There are arthropathic changes of the right glenohumeral joint and significant narrowing of the subacromial space consistent with chronic full-thickness rotator cuff tear. IMPRESSION: 1. No acute cardiopulmonary disease. 2. Endotracheal tube tip projects 5 cm above the carina. Electronically Signed   By: Lajean Manes M.D.   On: 05/04/2016 16:52   Dg Abd Portable 1v  Result Date: 05/05/2016 CLINICAL DATA:  OG tube placement. EXAM: PORTABLE ABDOMEN - 1 VIEW COMPARISON:  Chest x-ray today. FINDINGS: High KUB demonstrates interval placement of a nasogastric tube which is coiled months with tip and side-port over the stomach in the left upper quadrant. Visualized portions of the chest are unchanged. Upper abdomen demonstrates nonobstructive bowel gas pattern. IMPRESSION: Interval placement of nasogastric tube with tip and side-port over the stomach in the left upper quadrant. Electronically Signed   By: Marin Olp M.D.   On: 05/05/2016 20:22   Mr Jodene Nam Head/brain [id number] Cm  Result Date: 05/05/2016 CLINICAL DATA:  Left-sided hemi paresis. EXAM: MRI HEAD WITHOUT CONTRAST MRA HEAD WITHOUT CONTRAST TECHNIQUE: Multiplanar, multiecho pulse sequences of the brain and surrounding structures were obtained without  intravenous contrast. Angiographic images of the head were obtained using MRA technique without contrast. COMPARISON:  Head CT 05/04/2016 FINDINGS: MRI HEAD FINDINGS Brain: There is focal diffusion restriction within the right caudate head. Gradient echo imaging shows focal susceptibility within the right basal ganglia in the distribution of the hyperdensity demonstrated on the recent head CT. The midline structures are normal. There is multifocal hyperintense T2 weighted signal within the subcortical white matter. There is mild mass effect on the frontal horn of the right lateral ventricle. No midline shift or evidence of herniation. No hydrocephalus. Vascular: As above, focal susceptibility within the right basal ganglia. No evidence of chronic microhemorrhage or amyloid angiopathy. Skull and upper cervical spine: The visualized skull base, calvarium, upper  cervical spine and extracranial soft tissues are normal. Sinuses/Orbits: Fluid within the sphenoid sinuses. Maxillary mucosal thickening. Bilateral lens replacements. MRA HEAD FINDINGS Intracranial internal carotid arteries: Normal. Anterior cerebral arteries: Normal. Middle cerebral arteries: Normal. Posterior communicating arteries: Not visualized. Posterior cerebral arteries: Normal. Basilar artery: Normal. Vertebral arteries: Codominant. Normal. Superior cerebellar arteries: Normal. Anterior inferior cerebellar arteries: Normal on the right. Not visualized on the left. Posterior inferior cerebellar arteries: Normal. IMPRESSION: 1. Acute infarct of the right caudate head. No midline shift or significant mass effect. 2. Focal susceptibility within the right basal ganglia in the location of intraparenchymal hyperattenuation seen on the prior head CT. This is likely due to the presence of deoxyhemoglobin, favoring petechial hemorrhage over contrast staining. Short interval follow-up head CT is recommended, as contrast extravasation may be expected to clear 24  hours following angiography. 3. Normal MRA of the circle of Willis. Electronically Signed   By: Ulyses Jarred M.D.   On: 05/05/2016 04:33   Ct Head Code Stroke Wo Contrast`  Result Date: 05/04/2016 CLINICAL DATA:  Code stroke. Follow-up exam following right common carotid arteriogram with revascularization of occluded right M1 segment. EXAM: CT HEAD WITHOUT CONTRAST TECHNIQUE: Contiguous axial images were obtained from the base of the skull through the vertex without intravenous contrast. COMPARISON:  Prior studies from earlier the same day. FINDINGS: Blush of hyperdensity involves the right basal ganglia act, specifically the right caudate head and lentiform nucleus. Finding most consistent with contrast staining from recent catheter directed arteriogram rather than hemorrhage. IV contrast within the vascular structures of the brain noted. No definite acute intracranial hemorrhage. Gray-white matter differentiation otherwise maintained at this time without definite evidence for evolving ischemia. Stable atrophy with chronic microvascular ischemic disease. No mass lesion or midline shift. No mass effect. No hydrocephalus. No extra-axial fluid collection. Scalp soft tissues within normal limits. Globes and orbits demonstrate no acute abnormality. Patient status post lens extraction bilaterally. Scattered mucosal thickening throughout the maxillary sinuses and ethmoidal air cells as well as the sphenoid sinuses. Small fluid levels within the sphenoid sinuses present. No mastoid effusion. Calvarium intact. IMPRESSION: 1. Mild blush of hyperdensity involving the right basal ganglia, most consistent with contrast staining status post recent catheter directed arteriogram. No definite acute intracranial hemorrhage identified, although close interval follow-up is warranted. 2. No other significant evolving right MCA territory infarct identified at this time. 3. No other new acute intracranial process. Electronically  Signed   By: Jeannine Boga M.D.   On: 05/04/2016 14:35    Labs:  CBC:  Recent Labs  09/22/15 0954 05/04/16 0920 05/04/16 0934 05/05/16 0500 05/06/16 0606  WBC 6.1 9.3  --  6.8 6.9  HGB 14.9 13.7 14.6 11.3* 10.8*  HCT 45.1 41.4 43.0 34.4* 34.0*  PLT 219.0 141*  --  128* 124*    COAGS:  Recent Labs  05/04/16 0920  05/05/16 1310 05/05/16 1851 05/06/16 0100 05/06/16 0606  INR 2.45  < > 1.48 1.30 1.32 1.30  APTT 35  --   --   --   --   --   < > = values in this interval not displayed.  BMP:  Recent Labs  09/22/15 0954 05/04/16 0920 05/04/16 0934 05/05/16 0500 05/06/16 0606  NA 140 140 140 141 142  K 4.6 3.7 3.6 3.2* 3.3*  CL 102 106 101 113* 112*  CO2 29 26  --  24 24  GLUCOSE 89 113* 108* 88 111*  BUN 18 17 20 9  10  CALCIUM 9.6 9.0  --  8.2* 8.2*  CREATININE 0.74 0.78 0.80 0.66 0.54*  GFRNONAA  --  >60  --  >60 >60  GFRAA  --  >60  --  >60 >60    LIVER FUNCTION TESTS:  Recent Labs  09/22/15 0954 05/04/16 0920  BILITOT 0.7 1.0  AST 27 35  ALT 23 29  ALKPHOS 109 97  PROT 6.6 6.5  ALBUMIN 4.1 3.8    Assessment and Plan: 1. S/p revascularization of (R) MCA, s/p CVA -patient just extubated. -site with hematoma, but stable.  INR normal today at 1.3.  Will follow site -further care per neuro   Electronically Signed: Lucien Budney E 05/06/2016, 10:12 AM   I spent a total of 15 Minutes at the the patient's bedside AND on the patient's hospital floor or unit, greater than 50% of which was counseling/coordinating care for (R) MCA CVA, s/p revascularization

## 2016-05-06 NOTE — Progress Notes (Signed)
PT Cancellation Note  Patient Details Name: ROMAR HORNICK MRN: [id number] DOB: [birth year]   Cancelled Treatment:    Reason Eval/Treat Not Completed: Medical issues which prohibited therapy.  Patient extubated this am - 1 hour ago.  Will return at later time for PT evaluation.   Despina Pole 05/06/2016, 11:37 AM Carita Pian. Sanjuana Kava, [place] Pager [phone]

## 2016-05-06 NOTE — Progress Notes (Signed)
PULMONARY / CRITICAL CARE MEDICINE   Name: Brian [name] MRN: [id number] DOB: 08-Dec-1932    ADMISSION DATE:  05/04/2016 CONSULTATION DATE:  05/04/16  REFERRING MD:  Oleta Mouse - EDP  CHIEF COMPLAINT:  Code Stroke  HISTORY OF PRESENT ILLNESS:  Pt is encephelopathic; therefore, this HPI is obtained from chart review. Brian [name] is a 82 y.o. male with PMH as outlined below. He was brought to [facility] ED 9/14 as code stroke after being found down in kitchen with left sided hemiparesis.  He was last seen normal over 12 hours earlier, around 6:15 PM the evening prior.  Per pt's son, at roughly 6:15 AM on day of presentation, pt called out for son and when son came out, he found him on the floor unable to move left side.  In ED, he had CT of the head which demonstrated hyperdense right MCA.  CT angiogram demonstrated significant amount of viable tissue.  He was subsequently taken to IR for neuro intervention.  Post procedure, he remained on the vent and PCCM was asked to assist with vent management.  SUBJECTIVE:  FC well, more awake, strength better  VITAL SIGNS: BP 120/65 (BP Location: Right Arm)   Pulse 86   Temp 99.1 F (37.3 C) (Oral)   Resp (!) 27   Ht 5\' 7"  (1.702 m)   Wt 71.7 kg (158 lb 1.1 oz)   SpO2 100%   BMI 24.76 kg/m   HEMODYNAMICS:    VENTILATOR SETTINGS: Vent Mode: PRVC FiO2 (%):  [40 %] 40 % Set Rate:  [14 bmp] 14 bmp Vt Set:  [510 mL] 510 mL PEEP:  [5 cmH20] 5 cmH20 Plateau Pressure:  [9 cmH20-16 cmH20] 16 cmH20  INTAKE / OUTPUT: I/O last 3 completed shifts: In: 3387.1 [I.V.:2715; IV Piggyback:672.1] Out: 1110 [Urine:1110]   PHYSICAL EXAMINATION: General: Adult male, on vent Neuro: Follows commands, awake and alert, strength imrpoved left to some extent HEENT: perrl Cardiovascular: IRIR, no M/R/G.  Lungs: coarse mild to clear Abdomen: BS x 4, soft, NT/ND.  Musculoskeletal: No gross deformities, no edema.  Skin: Intact, warm, no  rashes.   LABS:  BMET  Recent Labs Lab 05/04/16 0920 05/04/16 0934 05/05/16 0500 05/06/16 0606  NA 140 140 141 142  K 3.7 3.6 3.2* 3.3*  CL 106 101 113* 112*  CO2 26  --  24 24  BUN 17 20 9 10   CREATININE 0.78 0.80 0.66 0.54*  GLUCOSE 113* 108* 88 111*    Electrolytes  Recent Labs Lab 05/04/16 0920 05/05/16 0500 05/06/16 0606  CALCIUM 9.0 8.2* 8.2*  MG  --  1.6* 1.6*  PHOS  --  2.3* 2.0*    CBC  Recent Labs Lab 05/04/16 0920 05/04/16 0934 05/05/16 0500 05/06/16 0606  WBC 9.3  --  6.8 6.9  HGB 13.7 14.6 11.3* 10.8*  HCT 41.4 43.0 34.4* 34.0*  PLT 141*  --  128* 124*    Coag's  Recent Labs Lab 05/04/16 0920  05/05/16 1851 05/06/16 0100 05/06/16 0606  APTT 35  --   --   --   --   INR 2.45  < > 1.30 1.32 1.30  < > = values in this interval not displayed.  Sepsis Markers No results for input(s): LATICACIDVEN, PROCALCITON, O2SATVEN in the last 168 hours.  ABG  Recent Labs Lab 05/04/16 1440 05/05/16 0426  PHART 7.430 7.381  PCO2ART 36.6 39.6  PO2ART 125* 150*    Liver Enzymes  Recent Labs Lab 05/04/16  0920  AST 35  ALT 29  ALKPHOS 97  BILITOT 1.0  ALBUMIN 3.8    Cardiac Enzymes No results for input(s): TROPONINI, PROBNP in the last 168 hours.  Glucose No results for input(s): GLUCAP in the last 168 hours.  Imaging Ct Head Wo Contrast  Result Date: 05/06/2016 CLINICAL DATA:  82 y/o  M; follow-up of hemorrhage. EXAM: CT HEAD WITHOUT CONTRAST TECHNIQUE: Contiguous axial images were obtained from the base of the skull through the vertex without intravenous contrast. COMPARISON:  05/05/2016 MR brain.  05/04/2016 CT head. FINDINGS: Brain: Stable focus of hemorrhage in the right basal ganglia with mild surrounding vasogenic edema and local mass effect centered in a small area of infarction better characterized on the prior MRI head. No evidence for new large territory infarct or intracranial hemorrhage. Background mild chronic  microvascular ischemic changes and parenchymal volume loss. Vascular: No hyperdense vessel or unexpected calcification. Skull: Normal. Negative for fracture or focal lesion. Sinuses/Orbits: Left maxillary sinus mucous retention cyst. Fluid levels in the sphenoid sinuses is probably due to intubation. Normally aerated mastoid air cells. Bilateral intra-ocular lens replacement. Other: None. IMPRESSION: Small hemorrhage within the right basal ganglia region of infarction is stable in comparison with the prior MRI given differences in technique. No new hemorrhage or large territory infarct is identified. Electronically Signed   By: Kristine Garbe M.D.   On: 05/06/2016 05:18   Dg Chest Port 1 View  Result Date: 05/06/2016 CLINICAL DATA:  Respiratory failure EXAM: PORTABLE CHEST 1 VIEW COMPARISON:  05/05/2016 FINDINGS: Endotracheal tube unchanged. Introduction of NG tube which extends in the course of the stomach below the level the found. LEFT basilar atelectasis and effusion. No pneumothorax. IMPRESSION: 1. Endotracheal tube unchanged.  Introduction of NG tube. 2. LEFT lower lobe effusion and atelectasis. Electronically Signed   By: Suzy Bouchard M.D.   On: 05/06/2016 08:17   Dg Abd Portable 1v  Result Date: 05/05/2016 CLINICAL DATA:  OG tube placement. EXAM: PORTABLE ABDOMEN - 1 VIEW COMPARISON:  Chest x-ray today. FINDINGS: High KUB demonstrates interval placement of a nasogastric tube which is coiled months with tip and side-port over the stomach in the left upper quadrant. Visualized portions of the chest are unchanged. Upper abdomen demonstrates nonobstructive bowel gas pattern. IMPRESSION: Interval placement of nasogastric tube with tip and side-port over the stomach in the left upper quadrant. Electronically Signed   By: Marin Olp M.D.   On: 05/05/2016 20:22    STUDIES:  CT perfusion 9/14 > R MCA distribution ischemia. Echo 9/15 > MRI 9/15 >Acute infarct of the right caudate head.  No midline shift or significant mass effect. 2. Focal susceptibility within the right basal ganglia in the location of intraparenchymal hyperattenuation seen on the prior head CT. This is likely due to the presence of deoxyhemoglobin  CT 9/16>>>Small hemorrhage within the right basal ganglia region of infarction is stable in comparison with the prior MRI given differences in technique. No new hemorrhage or large territory infarct is identified.  CULTURES: None.  ANTIBIOTICS: None.  SIGNIFICANT EVENTS: 9/14 > admitted with right MCA ischemia > taken to IR for neuro intervention. 9/15> too sedated to extubate, sheeth removed late  LINES/TUBES: ETT 9/14 >  DISCUSSION: 82 y.o. male admitted 9/14 with right MCA ischemia for which he was taken to IR where he had clot retrieval.  He returned to the ICU and remained ventilated.  PCCM was asked to assist with vent management.  ASSESSMENT / PLAN:  NEUROLOGIC A:   Acute right MCA ischemia - s/p neuro IR with clot retrieval. Acute encephalopathy - due to above + sedation. Hx TIA. Small bleed P:   Neuro following, stroke workup / management per them. Sedation:  Fentanyl gtt / Midazolam PRN - dc all RASS goal: 0 to -1. Daily WUA. Start home alprazolam, will WD bad without Echo  pending  PULMONARY A: VDRF - in the setting of acute right MCA ischemia. P:   Off all sedation WUA Weaning cpap 5ps 5,goal 30 min  Assess rsbi likley we extubate IS  CARDIOVASCULAR A:  Hx HTN, A.flutter (on warfarin). P:  Monitor hemodynamics. Goal SBP 110 - 130. Hold preadmission lotensin, doxazosin metoprolol, warfarin.  RENAL A:   Hypokalemia Hypomagnesia  hypophos hypercloremia P:   kvo No free water planned, allow cl  k , mag,phos supp bmet in am   GASTROINTESTINAL A:   GI prophylaxis. Nutrition. Hx GI bleed. P:   SUP: Pantoprazole. NPO. will need slp  HEMATOLOGIC Lab Results  Component Value Date   INR 1.30  05/06/2016   INR 1.32 05/06/2016   INR 1.30 05/05/2016   PROTIME 22.5 02/03/2009    A:   Chronic anticoagulation due to A.fib (Warfarin). VTE Prophylaxis. P:  SCD's. CBC in AM.  INFECTIOUS A:   No indication of infection. P:   Monitor clinically.  ENDOCRINE A:   No acute issues.   P:   Follow glucose   Family updated: 9/15 no family at [place], daughter updated by me  Interdisciplinary Family Meeting v Palliative Care Meeting:  Due by: 9/20.  CC time: 30 minutes.   Lavon Paganini. Titus Mould, MD, [place] Pgr: [facility] 05/06/2016 9:43 AM

## 2016-05-06 NOTE — Progress Notes (Signed)
SLP Cancellation Note  Patient Details Name: RASHADD BAZIL MRN: [id number] DOB: [birth year]   Cancelled treatment:       Reason Eval/Treat Not Completed: Patient not medically ready; pt extubated at 10:08 this morning.  Discussed importance of deferring BSE until pt medically ready to ensure swallowing accuracy is optimal; family/pt in agreement.   [place] Kretz,PAT, M.S., CCC-SLP 05/06/2016, 1:59 PM

## 2016-05-06 NOTE — Procedures (Signed)
Extubation Procedure Note  Patient Details:   Name: Brian [name] DOB: 03/25/1933 MRN: [id number]   Airway Documentation:     Evaluation  O2 sats: stable throughout Complications: No apparent complications Patient did tolerate procedure well. Bilateral Breath Sounds: Clear, Diminished   Yes  Lamonte Sakai 05/06/2016, 10:08 AM

## 2016-05-06 NOTE — Evaluation (Signed)
Speech Language Pathology Evaluation Patient Details Name: Brian [name] MRN: [id number] DOB: 1933/07/02 Today's Date: 05/06/2016 Time: 1230-1301 SLP Time Calculation (min) (ACUTE ONLY): 31 min  Problem List:  Patient Active Problem List   Diagnosis Date Noted  . Acute respiratory failure ([place])   . CVA (cerebral infarction) 05/04/2016  . Acute CVA (cerebrovascular accident) ([place])   . Cerebrovascular accident (CVA) due to thrombosis of right middle cerebral artery ([place])   . Stroke (cerebrum) ([place])   . Arterial hypotension   . Hypocalcemia   . Scoliosis (and kyphoscoliosis), idiopathic 01/26/2014  . ROTATOR CUFF SYNDROME, LEFT 03/07/2010  . OTH MALIG NEOPLASM SKIN OTH&UNSPEC PARTS FACE 12/06/2009  . ACTINIC KERATOSIS, HEAD 09/06/2009  . Osteoarthritis 02/20/2009  . SYNCOPE, HX OF 02/20/2009  . MITRAL VALVE REPLACEMENT, HX OF 02/20/2009  . HIP REPLACEMENT, TOTAL, HX OF 02/20/2009  . APPENDECTOMY, HX OF 02/20/2009  . HERNIORRHAPHY, HX OF 02/20/2009  . EPISTAXIS, RECURRENT 11/19/2008  . UNS ADVRS EFF UNS RX MEDICINAL&BIOLOGICAL SBSTNC 06/12/2008  . OTHER AND UNSPECIFIED MITRAL VALVE DISEASES 03/12/2008  . ATRIAL FLUTTER 03/12/2008  . Essential hypertension 03/06/2007  . PEPTIC ULCER DISEASE 03/06/2007  . BENIGN PROSTATIC HYPERTROPHY 03/06/2007  . Coronary atherosclerosis 02/13/2007  . ROSACEA 02/13/2007  . TRANSIENT ISCHEMIC ATTACK, HX OF 02/13/2007  . DVT, HX OF 02/13/2007   Past Medical History:  Past Medical History:  Diagnosis Date  . Anemia   . Atrial flutter ([place])   . BPH (benign prostatic hypertrophy)   . Epistaxis   . Hemorrhage of gastrointestinal tract, unspecified   . Hypertension   . Osteoarthrosis, unspecified whether generalized or localized, unspecified site   . Personal history of venous thrombosis and embolism   . Rosacea   . TIA (transient ischemic attack)    Past Surgical History:  Past Surgical History:  Procedure Laterality Date  . APPENDECTOMY     . HERNIA REPAIR    . JOINT REPLACEMENT    . MITRAL VALVE REPAIR    . mvp repair    . RADIOLOGY WITH ANESTHESIA N/A 05/04/2016   Procedure: RADIOLOGY WITH ANESTHESIA;  Surgeon: [name], MD;  Location: [facility];  Service: Radiology;  Laterality: N/A;  . TOTAL HIP ARTHROPLASTY     HPI:  82 y.o. male with past medical history of TIA, hypertension, atrial flutter. Patient was brought to the emergency department as code stroke after he was found down in the kitchen with left-sided hemiparesis. Patient's last known normal was 6:15 PM the prior night. Patient was brought in for immediate CT scan on 9/14/17which showed a right hyperdense MCA. MRI head on 05/05/16 indicated Acute infarct of the right caudate head. No midline shift or significant mass effect; CT head on 05/06/16 indicated Small hemorrhage within the right basal ganglia region of infarction is stable in comparison with the prior MRI given differences in technique. No new hemorrhage or large territory infarct is identified.  Assessment / Plan / Recommendation Clinical Impression  Pt exhibits moderate dysarthria and cognitive deficits, specifically with safety/awareness of deficits, organization, reasoning, problem solving and attention.  Pt exhibits hyperverbosity paired with moderate dysarthria, which makes his speech approximately 25-50% accurate within complex conversational tasks.  Phrase level, pt is approximately 50% accurate utilizing compensatory strategies with verbal cueing from SLP and 75-100% accurate at word level.  Pt attempted to get out of bed for the restroom despite reminders that a catheter was placed.  Pt was recently extubated this am, so BSE will be deferred until  a later time to ensure swallowing accuracy is optimal at the time of the assessment.  This was explained in detail to pt/family members present.  Family/pt in agreement to have BSE completed at a later time.      SLP Assessment  Patient needs continued  Speech Language Pathology Services    Follow Up Recommendations  Inpatient Rehab    Frequency and Duration min 2x/week  2 weeks      SLP Evaluation Cognition  Overall Cognitive Status: Impaired/Different from baseline Arousal/Alertness: Awake/alert Orientation Level: Oriented to person;Oriented to place;Disoriented to time;Disoriented to situation Attention: Sustained Sustained Attention: Impaired Sustained Attention Impairment: Verbal complex;Functional complex Memory: Impaired Memory Impairment: Decreased recall of new information;Decreased short term memory Decreased Short Term Memory: Verbal basic;Functional basic Awareness: Impaired Awareness Impairment: Anticipatory impairment Problem Solving: Impaired Problem Solving Impairment: Verbal basic;Functional basic Executive Function: Organizing;Decision Making;Reasoning Reasoning: Impaired Reasoning Impairment: Verbal basic;Functional basic Organizing: Impaired Organizing Impairment: Verbal basic;Functional basic Decision Making: Impaired Decision Making Impairment: Verbal basic;Functional basic Behaviors: Impulsive;Perseveration Safety/Judgment: Impaired Comments: Pt continued to ask to go home during SLE, go to bathroom with no apparent concern or awareness of left weakness or catheter in place       Comprehension  Auditory Comprehension Overall Auditory Comprehension: Appears within functional limits for tasks assessed Yes/No Questions: Within Functional Limits Commands: Within Functional Limits Conversation: Complex Other Conversation Comments: hyperverbosity Interfering Components: Attention;Other (comment) (intelligibility) EffectiveTechniques: Increased volume;Slowed speech Visual Recognition/Discrimination Discrimination: Not tested Reading Comprehension Reading Status: Not tested    Expression Expression Primary Mode of Expression: Verbal Verbal Expression Overall Verbal Expression: Impaired Initiation:  Impaired Level of Generative/Spontaneous Verbalization: Conversation Repetition: No impairment Naming: No impairment Pragmatics: Impairment Impairments: Topic maintenance;Turn Taking Interfering Components: Attention;Speech intelligibility Non-Verbal Means of Communication: Not applicable Written Expression Dominant Hand: Right Written Expression: Not tested   Oral / Motor  Oral Motor/Sensory Function Overall Oral Motor/Sensory Function: Moderate impairment Facial ROM: Reduced left;Suspected CN VII (facial) dysfunction Facial Symmetry: Abnormal symmetry left;Suspected CN VII (facial) dysfunction Facial Strength: Reduced left;Suspected CN VII (facial) dysfunction Facial Sensation: Within Functional Limits Lingual ROM: Reduced left Lingual Symmetry: Abnormal symmetry left Lingual Strength: Reduced Velum: Within Functional Limits Mandible: Within Functional Limits Motor Speech Overall Motor Speech: Appears within functional limits for tasks assessed Respiration: Impaired Level of Impairment: Sentence Phonation: Low vocal intensity;Hoarse;Other (comment) (recently extubated) Resonance: Within functional limits Articulation: Impaired Level of Impairment: Phrase Intelligibility: Intelligibility reduced Word: 50-74% accurate Phrase: 25-49% accurate Sentence: 25-49% accurate Conversation: 0-24% accurate Motor Planning: Witnin functional limits Motor Speech Errors: Not applicable Effective Techniques: Slow rate;Increased vocal intensity;Over-articulate            Functional Assessment Tool Used: NOMS Functional Limitations: Spoken language expressive Spoken Language Expression Current Status [phone]): At least 60 percent but less than 80 percent impaired, limited or restricted Spoken Language Expression Goal Status [phone]): At least 40 percent but less than 60 percent impaired, limited or restricted         Alaynna Kerwood,PAT, M.S.,CCC-SLP 05/06/2016, 1:51 PM

## 2016-05-06 NOTE — Progress Notes (Signed)
*  PRELIMINARY RESULTS* Echocardiogram 2D Echocardiogram has been performed.  Leavy Cella 05/06/2016, 3:40 PM

## 2016-05-06 NOTE — Progress Notes (Signed)
STROKE TEAM PROGRESS NOTE   HISTORY OF PRESENT ILLNESS (per record) Brian [name] is an 82 y.o. male with past medical history of TIA, hypertension, atrial flutter. Patient was brought to the emergency department as code stroke after he was found down in the kitchen with left-sided hemiparesis. Patient's last known normal was 6:15 PM the prior night. Per son as 6:15 this morning patient called out to him and he was found on the floor not moving his left side. Patient was brought in for immediate CT scan which showed a right hyperdense MCA. At that time patient was considered to be a possible wake up stroke. Patient was brought for immediate CT angiogram and perfusion. CT perfusion showed significant amount of viable tissue. For this reason he was brought to intervention radiology.  Date last known well: Date: 05/03/2016 Time last known well: Time: 18:15 tPA Given: No: out of window   Cerebral Angiogram Rt common carotid arteriogram,followed by complete angiographic revascularization of occluded RT MCA M1 seg using x1 pass with solitaire 10mmx 40 mm retrieval device and 3.6 mg of superselective IA integrelin achieving a TICI 2b/TICI 3 reperfusion   SUBJECTIVE (INTERVAL HISTORY) His  daughter is at the bedside. I also talked to his son-in-law over the phone who is a critical care physician in [place]. Overall he feels his condition is rapidly improving. He is still intubated but able to follow all commands. His INR is morning 1.3 this am after reversal.  . MRI showed right basal ganglia small infarct with hemorrhagic transformation.  n.   OBJECTIVE Temp:  [97.8 F (36.6 C)-99.7 F (37.6 C)] 98.7 F (37.1 C) (09/16 1200) Pulse Rate:  [62-101] 69 (09/16 1100) Cardiac Rhythm: Atrial fibrillation (09/16 0800) Resp:  [11-27] 13 (09/16 1100) BP: (108-141)/(45-102) 126/63 (09/16 1100) SpO2:  [100 %] 100 % (09/16 1100) Arterial Line BP: (92-157)/(49-85) 92/85 (09/16 0800) FiO2 (%):  [40 %]  40 % (09/16 0823)  CBC:   Recent Labs Lab 05/04/16 0920  05/05/16 0500 05/06/16 0606  WBC 9.3  --  6.8 6.9  NEUTROABS 7.9*  --  5.0  --   HGB 13.7  < > 11.3* 10.8*  HCT 41.4  < > 34.4* 34.0*  MCV 90.2  --  90.1 91.6  PLT 141*  --  128* 124*  < > = values in this interval not displayed.  Basic Metabolic Panel:   Recent Labs Lab 05/05/16 0500 05/06/16 0606  NA 141 142  K 3.2* 3.3*  CL 113* 112*  CO2 24 24  GLUCOSE 88 111*  BUN 9 10  CREATININE 0.66 0.54*  CALCIUM 8.2* 8.2*  MG 1.6* 1.6*  PHOS 2.3* 2.0*    Lipid Panel:     Component Value Date/Time   CHOL 114 05/05/2016 0500   TRIG 54 05/05/2016 0500   HDL 41 05/05/2016 0500   CHOLHDL 2.8 05/05/2016 0500   VLDL 11 05/05/2016 0500   LDLCALC 62 05/05/2016 0500   HgbA1c:  Lab Results  Component Value Date   HGBA1C 5.3 05/05/2016   Urine Drug Screen: No results found for: LABOPIA, COCAINSCRNUR, LABBENZ, AMPHETMU, THCU, LABBARB    IMAGING I have personally reviewed the radiological images below and agree with the radiology interpretations.  Ct Angio Head and Neck and CT perfusion 05/04/2016 Thrombus with occlusion of the right carotid terminus with poor flow to the right middle cerebral artery branches (collateral flow to distal right middle cerebral artery branches). This is causing decreased caliber of  the right internal carotid artery petrous and cavernous segment without high-grade proximal stenosis.  Large area of ischemia involving the right middle cerebral artery distribution.   Ct Head Wo Contrast 05/04/2016 HEAD CT:  1. Hyperdense right middle cerebral artery sign accompanied by subtle decreased attenuation along the right insular cortex. This suggests a recent right middle cerebral artery infarct due to an M1 segment embolus/thrombus. Recommend follow-up brain MRI.  2. No other acute intracranial abnormalities. No intracranial hemorrhage.   Mr Jodene Nam Head/brain Wo Cm 05/05/2016 1. Acute infarct of the  right caudate head. No midline shift or significant mass effect.  2. Focal susceptibility within the right basal ganglia in the location of intraparenchymal hyperattenuation seen on the prior head CT. This is likely due to the presence of deoxyhemoglobin, favoring petechial hemorrhage over contrast staining.  3. Normal MRA of the circle of Willis.   Dg Chest Port 1 View 05/05/2016 Minimal bibasilar atelectasis. Enlargement cardiac silhouette post MVR. Electronically Signed     Ct Head Code Stroke Wo Contrast` 05/04/2016 1. Mild blush of hyperdensity involving the right basal ganglia, most consistent with contrast staining status post recent catheter directed arteriogram.  No definite acute intracranial hemorrhage identified, although close interval follow-up is warranted. 2. No other significant evolving right MCA territory infarct identified at this time. 3. No other new acute intracranial process.   2-D Echo pending   PHYSICAL EXAM  Temp:  [97.8 F (36.6 C)-99.7 F (37.6 C)] 98.7 F (37.1 C) (09/16 1200) Pulse Rate:  [62-101] 69 (09/16 1100) Resp:  [11-27] 13 (09/16 1100) BP: (108-141)/(45-102) 126/63 (09/16 1100) SpO2:  [100 %] 100 % (09/16 1100) Arterial Line BP: (92-157)/(49-85) 92/85 (09/16 0800) FiO2 (%):  [40 %] 40 % (09/16 0823)  General - Well nourished, well developed, intubated.  Ophthalmologic - Fundi not visualized.  Cardiovascular - irregularly irregular heart rate and rhythm.  Neuro - awake, alert, intubated, eyes open on voice, follows all central and peripheral commands. PERRL, blinking to visual threat bilaterally, positive corneal and gag reflexes. RUE and RLE 5/5, LLE 5/5 and LUE 4/5 with 3/5 finger grasp and difficulty with dexterity. Sensation symmetrical bilaterally, FTN intact bilaterally although slow on the left side. DTR 1+ and no Babinski. Gait not tested    ASSESSMENT/PLAN [name] is a 82 y.o. male with history of previous TIA, history  of venous thrombosis and embolism, hypertension, previous GI bleed, epistaxis, anemia and atrial flutter with warfarin therapy ( INR 2.8 at time of admission) presenting with left hemiparesis. He did not receive IV t-PA due to late presentation and warfarin.  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 fib  Resultant  intubated, right upper extremity mild paresis  MRI - Acute infarct of the right BG with petechiae hemorrhagic transformation.  MRA - Normal MRA of the circle of Willis.   CTA head and neck - right ICA terminus occlusion  Cerebral angiogram - complete revascularization of occluded right MCA, TICI 2b/TICI 3 reperfusion  2D Echo - pending   LDL - 62  HgbA1c 5.3  VTE prophylaxis - SCDs and INR 2.88 Diet NPO time specified  warfarin daily prior to admission, currently Coumadin on hold but INR 2.88. Due to hemorrhagic transformation with high INR, will use Kcentra to reverse INR to avoid further hemorrhagic transformation.  Removal sheath after INR normalized  Hopefully extubate after sheath removal  Once INR normalizes, start aspirin for stroke prevention  and subcutaneous heparin for DVT prophylaxis.  Repeat CT in a.m., if no frank hematoma, may consider to start DOACs for stroke prevention.   Plan discussed with daughter at bedside and son-in-law over the phone who is a critical physician in [place]. They are in agreement.  Patient counseled to be compliant with his antithrombotic medications  Ongoing aggressive stroke risk factor management  Therapy recommendations: pending  Disposition: pending  Persistent A. fib on Coumadin  INR on admission 2.8  Coumadin on hold, however, INR in a.m. 2.88  Reversal with Kcentra to avoid further hemorrhagic transformation  May consider DOACs after 1 week post stroke  History of GI bleeding  Has been years ago  No recent GI bleeding on Coumadin  Hb  13.7 -> 11.3  Close monitoring   CBC in a.m.  Hypertension  Blood pressure running somewhat low  Permissive hypertension (OK if < 180/105 due to high INR) but gradually normalize in 5-7 days  Long-term BP goal normotensive  Hyperlipidemia  Home meds:  No lipid lowering medications prior to admission  LDL 62, goal < 70  Other Stroke Risk Factors  Advanced age  Cigarette smoker - already quit  Hx of TIA  Other Active Problems  Mild thrombocytopenia, platelet 128  Hypokalemia - 3.2, supplement  Hospital day # 2 Plan extubate today. Speech therapy consult for swallowing after extubation.. This patient is critically ill due to right MCA occlusion s/p IR, A. fib on Coumadin with therapeutic INR, history of GI bleeding, and at significant risk of neurological worsening, death form hemorrhagic transformation, recurrent stroke, respiratory failure, heart failure, further GI bleeding. This patient's care requires constant monitoring of vital signs, hemodynamics, respiratory and cardiac monitoring, review of multiple databases, neurological assessment, discussion with family, other specialists and medical decision making of high complexity. I spent 59minutes of neurocritical care time in the care of this patient. I had long discussion with daughter at bedside and son-in-law over the phone, updating patient condition, treatment plan and possible prognosis. Discussed with Dr. Titus Mould.  Antony Contras, MD Stroke Neurology 05/06/2016  1:31 PM    To contact Stroke Continuity provider, please refer to [email or web address] After hours, contact General Neurology

## 2016-05-07 DIAGNOSIS — I6300 Cerebral infarction due to thrombosis of unspecified precerebral artery: Secondary | ICD-10-CM

## 2016-05-07 LAB — BASIC METABOLIC PANEL
Anion gap: 7 (ref 5–15)
BUN: 10 mg/dL (ref 6–20)
CALCIUM: 8.3 mg/dL — AB (ref 8.9–10.3)
CO2: 28 mmol/L (ref 22–32)
Chloride: 109 mmol/L (ref 101–111)
Creatinine, Ser: 0.71 mg/dL (ref 0.61–1.24)
GFR calc Af Amer: >60 mL/min (ref >60)
GFR calc non Af Amer: >60 mL/min (ref >60)
GLUCOSE: 99 mg/dL (ref 65–99)
Potassium: 3.3 mmol/L — ABNORMAL LOW (ref 3.5–5.1)
SODIUM: 144 mmol/L (ref 135–145)

## 2016-05-07 LAB — MAGNESIUM: MAGNESIUM: 1.8 mg/dL (ref 1.7–2.4)

## 2016-05-07 LAB — PHOSPHORUS: PHOSPHORUS: 2.4 mg/dL — AB (ref 2.5–4.6)

## 2016-05-07 LAB — PROTIME-INR
INR: 1.32
Prothrombin Time: 16.5 seconds — ABNORMAL HIGH (ref 11.4–15.2)

## 2016-05-07 MED ORDER — CHLORHEXIDINE GLUCONATE 0.12 % MT SOLN
15.0000 mL | Freq: Two times a day (BID) | OROMUCOSAL | Status: DC
  Administered 2016-05-08: 15 mL via OROMUCOSAL
  Filled 2016-05-07: qty 15

## 2016-05-07 MED ORDER — ORAL CARE MOUTH RINSE
15.0000 mL | Freq: Two times a day (BID) | OROMUCOSAL | Status: DC
Start: 2016-05-08 — End: 2016-05-08

## 2016-05-07 MED ORDER — POTASSIUM CHLORIDE CRYS ER 20 MEQ PO TBCR
40.0000 meq | EXTENDED_RELEASE_TABLET | Freq: Once | ORAL | Status: AC
  Administered 2016-05-07: 40 meq via ORAL
  Filled 2016-05-07: qty 2

## 2016-05-07 MED ORDER — K PHOS MONO-SOD PHOS DI & MONO 155-852-130 MG PO TABS
250.0000 mg | ORAL_TABLET | Freq: Once | ORAL | Status: AC
  Administered 2016-05-07: 250 mg via ORAL
  Filled 2016-05-07: qty 1

## 2016-05-07 NOTE — Progress Notes (Signed)
Rehab Admissions Coordinator Note:  Patient was screened by Cleatrice Burke for appropriateness for an Inpatient Acute Rehab Consult per PT recommendation.  At this time, we are recommending Inpatient Rehab consult.  Cleatrice Burke 05/07/2016, 12:46 PM  I can be reached at [phone].

## 2016-05-07 NOTE — Evaluation (Signed)
Physical Therapy Evaluation Patient Details Name: Brian [name] MRN: [id number] DOB: [birth year] Today's Date: 05/07/2016   History of Present Illness  Patient is an 82 yo male admitted 05/04/16 as code stroke after he was found down in the kitchen with Lt-sided hemiparesis.  Patient s/p revascularization Rt MCA.   PMH:  TIA, HTN, Aflutter, HLD  Clinical Impression  Patient presents with problems listed below.  Will benefit from acute PT to maximize functional mobility prior to discharge.  Patient independent with assistive device pta.  Recommend Inpatient Rehab consult to return patient to optimal functional level prior to return home.    Follow Up Recommendations CIR;Supervision for mobility/OOB    Equipment Recommendations  None recommended by PT    Recommendations for Other Services Rehab consult     Precautions / Restrictions Precautions Precautions: Fall Restrictions Weight Bearing Restrictions: No      Mobility  Bed Mobility               General bed mobility comments: Patient in chair  Transfers Overall transfer level: Needs assistance Equipment used: Rolling walker (2 wheeled) Transfers: Sit to/from Stand Sit to Stand: Min assist         General transfer comment: Verbal cues for hand placement.  Assist to rise to standing and for balance initially.  Ambulation/Gait Ambulation/Gait assistance: Mod assist;+2 safety/equipment Ambulation Distance (Feet): 36 Feet Assistive device: Rolling walker (2 wheeled) Gait Pattern/deviations: Step-to pattern;Decreased stance time - left;Decreased step length - left;Decreased step length - right;Decreased stride length;Decreased weight shift to left;Shuffle;Trunk flexed Gait velocity: decreased Gait velocity interpretation: Below normal speed for age/gender General Gait Details: Verbal cues for safe use of RW, keep feet inside RW, and stand upright during gait.  Patient with slow shuffle steps.  Decreased weight shift to  LLE, and decreased step length with LLE - keeping LLE behind during gait.  Loss of balance x2 during gait requiring assist to maintain balance.  Stairs            Wheelchair Mobility    Modified Rankin (Stroke Patients Only) Modified Rankin (Stroke Patients Only) Pre-Morbid Rankin Score: Slight disability Modified Rankin: Moderately severe disability     Balance Overall balance assessment: Needs assistance Sitting-balance support: No upper extremity supported;Feet supported Sitting balance-Leahy Scale: Fair     Standing balance support: Bilateral upper extremity supported Standing balance-Leahy Scale: Poor                               Pertinent Vitals/Pain Pain Assessment: No/denies pain    Home Living Family/patient expects to be discharged to:: Private residence Living Arrangements: Spouse/significant other;Children (grown son) Available Help at Discharge: Family;Available PRN/intermittently (Wife unable to assist.  Son works) Type of Home: House Home Access: Stairs to enter Entrance Stairs-Rails: Right Entrance Stairs-Number of Steps: [place]: One level Home Equipment: Environmental consultant - 2 wheels;Walker - 4 wheels;Cane - single point      Prior Function Level of Independence: Independent with assistive device(s)         Comments: Owns Sports administrator Dominance   Dominant Hand: Right    Extremity/Trunk Assessment   Upper Extremity Assessment: Defer to OT evaluation           Lower Extremity Assessment: Generalized weakness;LLE deficits/detail   LLE Deficits / Details: Slightly weaker than RLE.  Grossly at 4/5.  Cervical / Trunk Assessment: Kyphotic  Communication  Communication: Expressive difficulties (mumbled speech)  Cognition Arousal/Alertness: Awake/alert Behavior During Therapy: WFL for tasks assessed/performed;Impulsive Overall Cognitive Status: Within Functional Limits for tasks assessed                       General Comments      Exercises     Assessment/Plan    PT Assessment Patient needs continued PT services  PT Problem List Decreased strength;Decreased activity tolerance;Decreased balance;Decreased mobility;Decreased coordination;Decreased safety awareness          PT Treatment Interventions DME instruction;Gait training;Functional mobility training;Therapeutic activities;Balance training;Neuromuscular re-education;Patient/family education    PT Goals (Current goals can be found in the Care Plan section)  Acute Rehab PT Goals Patient Stated Goal: To go home PT Goal Formulation: With patient Time For Goal Achievement: 05/21/16 Potential to Achieve Goals: Good    Frequency Min 3X/week   Barriers to discharge Decreased caregiver support Wife unable to care for patient.  Son works    Co-evaluation PT/OT/SLP Co-Evaluation/Treatment: Yes Reason for Co-Treatment: For Doctor, hospital PT goals addressed during session: Mobility/safety with mobility         End of Session Equipment Utilized During Treatment: Gait belt Activity Tolerance: Patient tolerated treatment well;Patient limited by fatigue Patient left: in chair;with call bell/phone within reach Nurse Communication: Mobility status         Time: [id number] PT Time Calculation (min) (ACUTE ONLY): 21 min   Charges:   PT Evaluation $PT Eval Moderate Complexity: 1 Procedure     PT G CodesDespina Pole May 10, 2016, 11:08 AM Carita Pian. Sanjuana Kava, [place] Pager [phone]

## 2016-05-07 NOTE — Evaluation (Signed)
Occupational Therapy Evaluation Patient Details Name: Brian [name] MRN: [id number] DOB: August 29, 1932 Today's Date: 05/07/2016    History of Present Illness Patient is an 82 yo male admitted 05/04/16 as code stroke after he was found down in the kitchen with Lt-sided hemiparesis.  Patient s/p revascularization Rt MCA.   PMH:  TIA, HTN, Aflutter, HLD   Clinical Impression   This 82 yo male admitted with above presents to acute OT with deficits below (see OT problem list) thus affecting his PLOF of Mod I for basic ADLs and still working at his furniture store. He will benefit from acute OT with follow up on CIR.    Follow Up Recommendations  CIR    Equipment Recommendations  Tub/shower seat    Recommendations for Other Services Rehab consult     Precautions / Restrictions Precautions Precautions: Fall Restrictions Weight Bearing Restrictions: No      Mobility Bed Mobility               General bed mobility comments: Patient in chair  Transfers Overall transfer level: Needs assistance Equipment used: Rolling walker (2 wheeled) Transfers: Sit to/from Stand Sit to Stand: Min assist         General transfer comment: Verbal cues for hand placement.  Assist to rise to standing and for balance initially.    Balance Overall balance assessment: Needs assistance Sitting-balance support: No upper extremity supported;Feet supported Sitting balance-Leahy Scale: Fair     Standing balance support: Bilateral upper extremity supported Standing balance-Leahy Scale: Poor                              ADL Overall ADL's : Needs assistance/impaired Eating/Feeding: NPO   Grooming: Moderate assistance;Sitting;Wash/dry face   Upper Body Bathing: Minimal assitance;Sitting   Lower Body Bathing: Moderate assistance (min A sit<>stand)   Upper Body Dressing : Moderate assistance;Sitting   Lower Body Dressing: Moderate assistance (min A sit<>stand)   Toilet  Transfer: Moderate assistance;Ambulation;RW (bed>out door and ambulated>sit in recliner behind him)   Toileting- Water quality scientist and Hygiene: Moderate assistance (min A sit<>stand)               Vision Vision Assessment?: Yes Eye Alignment: Within Functional Limits Ocular Range of Motion: Within Functional Limits Alignment/Gaze Preference: Within Defined Limits Tracking/Visual Pursuits: Other (comment) (decreased smoothness of tracking throughout assessment) Convergence: Within functional limits Visual Fields:  (tested by having pt locate pen--he was able to this with head turns that he initiated) Additional Comments: ptosis of right eye however pt reports usually it is his left eye          Pertinent Vitals/Pain Pain Assessment: No/denies pain     Hand Dominance  right   Extremity/Trunk Assessment Upper Extremity Assessment Upper Extremity Assessment: LUE deficits/detail LUE Deficits / Details: moderate decreased use of LUE but does attempt to use functionally for (pushing up and reaching back from chair) and did some "talking" with his hands LUE Coordination: decreased fine motor;decreased gross motor   Lower Extremity Assessment Lower Extremity Assessment: Generalized weakness;LLE deficits/detail LLE Deficits / Details: Slightly weaker than RLE.  Grossly at 4/5. LLE Sensation: decreased light touch LLE Coordination: decreased gross motor   Cervical / Trunk Assessment Cervical / Trunk Assessment: Kyphotic   Communication  mumbles   Cognition Arousal/Alertness: Awake/alert Behavior During Therapy: WFL for tasks assessed/performed;Impulsive Overall Cognitive Status: Within Functional Limits for tasks assessed  OT Problem List: Decreased strength;Decreased range of motion;Impaired balance (sitting and/or standing);Impaired vision/perception;Decreased coordination;Decreased safety awareness;Decreased  knowledge of use of DME or AE;Impaired tone;Impaired UE functional use   OT Treatment/Interventions: Self-care/ADL training;Balance training;Therapeutic activities;DME and/or AE instruction;Therapeutic exercise;Cognitive remediation/compensation;Visual/perceptual remediation/compensation;Patient/family education    OT Goals(Current goals can be found in the care plan section) Acute Rehab OT Goals Patient Stated Goal: To go home and back to work OT Goal Formulation: With patient Time For Goal Achievement: 05/14/16 Potential to Achieve Goals: Good  OT Frequency: Min 3X/week           Co-evaluation PT/OT/SLP Co-Evaluation/Treatment: Yes Reason for Co-Treatment: For patient/therapist safety PT goals addressed during session: Mobility/safety with mobility OT goals addressed during session: ADL's and self-care;Strengthening/ROM      End of Session Equipment Utilized During Treatment: Gait belt;Rolling walker Nurse Communication: Mobility status  Activity Tolerance: Patient tolerated treatment well Patient left: in chair;with call bell/phone within reach   Time: 0904-0925 OT Time Calculation (min): 21 min Charges:  OT General Charges $OT Visit: 1 Procedure OT Evaluation $OT Eval Moderate Complexity: 1 Procedure  Almon Register [id number] 05/07/2016, 1:49 PM

## 2016-05-07 NOTE — Progress Notes (Signed)
PULMONARY / CRITICAL CARE MEDICINE   Name: NEMANJA EPP MRN: [id number] DOB: 13-Mar-1933    ADMISSION DATE:  05/04/2016 CONSULTATION DATE:  05/04/16  REFERRING MD:  Oleta Mouse - EDP  CHIEF COMPLAINT:  Code Stroke  HISTORY OF PRESENT ILLNESS:  Pt is encephelopathic; therefore, this HPI is obtained from chart review. CYRIC WILFERT is a 82 y.o. male with PMH as outlined below. He was brought to [facility] ED 9/14 as code stroke after being found down in kitchen with left sided hemiparesis.  He was last seen normal over 12 hours earlier, around 6:15 PM the evening prior.  Per pt's son, at roughly 6:15 AM on day of presentation, pt called out for son and when son came out, he found him on the floor unable to move left side.  In ED, he had CT of the head which demonstrated hyperdense right MCA.  CT angiogram demonstrated significant amount of viable tissue.  He was subsequently taken to IR for neuro intervention.  Post procedure, he remained on the vent and PCCM was asked to assist with vent management.  SUBJECTIVE:   Extubated No distress  VITAL SIGNS: BP (!) 97/46 (BP Location: Right Arm)   Pulse (!) 35   Temp 99.6 F (37.6 C) (Axillary)   Resp 17   Ht 5\' 7"  (1.702 m)   Wt 71.7 kg (158 lb 1.1 oz)   SpO2 98%   BMI 24.76 kg/m   HEMODYNAMICS:    VENTILATOR SETTINGS:    INTAKE / OUTPUT: I/O last 3 completed shifts: In: 1431.6 [I.V.:1128.3; IV Piggyback:303.3] Out: 915 [Urine:915]   PHYSICAL EXAMINATION: General: Adult male, on vent Neuro: follows commands, aphasia mild, equal arms movement HEENT: perrl Cardiovascular: IRIR, no M/R/G  Lungs: cta Abdomen: BS x 4, soft, NT/ND.  Musculoskeletal: No gross deformities, no edema.  Skin: Intact, warm, no rashes.   LABS:  BMET  Recent Labs Lab 05/05/16 0500 05/06/16 0606 05/07/16 0753  NA 141 142 144  K 3.2* 3.3* 3.3*  CL 113* 112* 109  CO2 24 24 28   BUN 9 10 10   CREATININE 0.66 0.54* 0.71  GLUCOSE 88 111* 99     Electrolytes  Recent Labs Lab 05/05/16 0500 05/06/16 0606 05/07/16 0753  CALCIUM 8.2* 8.2* 8.3*  MG 1.6* 1.6* 1.8  PHOS 2.3* 2.0* 2.4*    CBC  Recent Labs Lab 05/04/16 0920 05/04/16 0934 05/05/16 0500 05/06/16 0606  WBC 9.3  --  6.8 6.9  HGB 13.7 14.6 11.3* 10.8*  HCT 41.4 43.0 34.4* 34.0*  PLT 141*  --  128* 124*    Coag's  Recent Labs Lab 05/04/16 0920  05/06/16 0606 05/06/16 1304 05/07/16 0753  APTT 35  --   --   --   --   INR 2.45  < > 1.30 1.23 1.32  < > = values in this interval not displayed.  Sepsis Markers No results for input(s): LATICACIDVEN, PROCALCITON, O2SATVEN in the last 168 hours.  ABG  Recent Labs Lab 05/04/16 1440 05/05/16 0426  PHART 7.430 7.381  PCO2ART 36.6 39.6  PO2ART 125* 150*    Liver Enzymes  Recent Labs Lab 05/04/16 0920  AST 35  ALT 29  ALKPHOS 97  BILITOT 1.0  ALBUMIN 3.8    Cardiac Enzymes No results for input(s): TROPONINI, PROBNP in the last 168 hours.  Glucose No results for input(s): GLUCAP in the last 168 hours.  Imaging No results found.  STUDIES:  CT perfusion 9/14 > R MCA distribution  ischemia. Echo 9/15 > MRI 9/15 >Acute infarct of the right caudate head. No midline shift or significant mass effect. 2. Focal susceptibility within the right basal ganglia in the location of intraparenchymal hyperattenuation seen on the prior head CT. This is likely due to the presence of deoxyhemoglobin  CT 9/16>>>Small hemorrhage within the right basal ganglia region of infarction is stable in comparison with the prior MRI given differences in technique. No new hemorrhage or large territory infarct is identified.  CULTURES: None.  ANTIBIOTICS: None.  SIGNIFICANT EVENTS: 9/14 > admitted with right MCA ischemia > taken to IR for neuro intervention. 9/15> too sedated to extubate, sheeth removed late  LINES/TUBES: ETT 9/14 >9/16  DISCUSSION: 82 y.o. male admitted 9/14 with right MCA ischemia  for which he was taken to IR where he had clot retrieval.  He returned to the ICU and remained ventilated.  PCCM was asked to assist with vent management.  ASSESSMENT / PLAN:  NEUROLOGIC A:   Acute right MCA ischemia - s/p neuro IR with clot retrieval. Acute encephalopathy - due to above + sedation. Hx TIA. Small bleed P:   Per neuro anticoagulation n 1 week per neuro RASS goal: 0  home alprazolam, will WD bad without, ensure restart Echo  reviewed  PULMONARY A: VDRF - in the setting of acute right MCA ischemia. P:   IS  CARDIOVASCULAR A:  Hx HTN, A.flutter (on warfarin). P:  Monitor hemodynamics. Goal SBP 110 - 130, controlled Hold preadmission lotensin, doxazosin , not able to give yet Metoprolol asa  RENAL A:   Hypokalemia P:   kvo No free water planned, allow cl  k , phos needed  GASTROINTESTINAL A:   GI prophylaxis. Nutrition. Hx GI bleed. P:   SUP: Pantoprazole keep as home med slp for modified  HEMATOLOGIC Lab Results  Component Value Date   INR 1.32 05/07/2016   INR 1.23 05/06/2016   INR 1.30 05/06/2016   PROTIME 22.5 02/03/2009    A:   Chronic anticoagulation due to A.fib (Warfarin). VTE Prophylaxis. P:  SCD's  INFECTIOUS A:   No indication of infection. P:   Monitor clinically. IS  ENDOCRINE A:   No acute issues.   P:   Follow glucose   Family updated: 9/15 no family at [address], daughter updated by me. I updated MD son in law  Interdisciplinary Family Meeting v Palliative Care Meeting:  Due by: 9/20.  Will sign off  Lavon Paganini. Titus Mould, MD, [place] Pgr: [facility] 05/07/2016 10:30 AM

## 2016-05-07 NOTE — Progress Notes (Signed)
STROKE TEAM PROGRESS NOTE   HISTORY OF PRESENT ILLNESS (per record) Brian [name] is an 82 y.o. male with past medical history of TIA, hypertension, atrial flutter. Patient was brought to the emergency department as code stroke after he was found down in the kitchen with left-sided hemiparesis. Patient's last known normal was 6:15 PM the prior night. Per son as 6:15 this morning patient called out to him and he was found on the floor not moving his left side. Patient was brought in for immediate CT scan which showed a right hyperdense MCA. At that time patient was considered to be a possible wake up stroke. Patient was brought for immediate CT angiogram and perfusion. CT perfusion showed significant amount of viable tissue. For this reason he was brought to intervention radiology.  Date last known well: Date: 05/03/2016 Time last known well: Time: 18:15 tPA Given: No: out of window   Cerebral Angiogram Rt common carotid arteriogram,followed by complete angiographic revascularization of occluded RT MCA M1 seg using x1 pass with solitaire 39mmx 40 mm retrieval device and 3.6 mg of superselective IA integrelin achieving a TICI 2b/TICI 3 reperfusion   SUBJECTIVE (INTERVAL HISTORY) His  Son in law is at the bedside.   Overall he feels his condition is rapidly improving. He is still intubated but able to follow all commands. His INR is morning 1.32 this am    .Tolerated extubation well y`day.   OBJECTIVE Temp:  [97.5 F (36.4 C)-99.6 F (37.6 C)] 97.5 F (36.4 C) (09/17 1200) Pulse Rate:  [35-110] 35 (09/17 0800) Cardiac Rhythm: Atrial fibrillation (09/17 0800) Resp:  [11-30] 17 (09/17 0800) BP: (97-131)/(46-100) 116/57 (09/17 1204) SpO2:  [86 %-100 %] 98 % (09/17 0800)  CBC:   Recent Labs Lab 05/04/16 0920  05/05/16 0500 05/06/16 0606  WBC 9.3  --  6.8 6.9  NEUTROABS 7.9*  --  5.0  --   HGB 13.7  < > 11.3* 10.8*  HCT 41.4  < > 34.4* 34.0*  MCV 90.2  --  90.1 91.6  PLT 141*  --   128* 124*  < > = values in this interval not displayed.  Basic Metabolic Panel:   Recent Labs Lab 05/06/16 0606 05/07/16 0753  NA 142 144  K 3.3* 3.3*  CL 112* 109  CO2 24 28  GLUCOSE 111* 99  BUN 10 10  CREATININE 0.54* 0.71  CALCIUM 8.2* 8.3*  MG 1.6* 1.8  PHOS 2.0* 2.4*    Lipid Panel:     Component Value Date/Time   CHOL 114 05/05/2016 0500   TRIG 54 05/05/2016 0500   HDL 41 05/05/2016 0500   CHOLHDL 2.8 05/05/2016 0500   VLDL 11 05/05/2016 0500   LDLCALC 62 05/05/2016 0500   HgbA1c:  Lab Results  Component Value Date   HGBA1C 5.3 05/05/2016   Urine Drug Screen: No results found for: LABOPIA, COCAINSCRNUR, LABBENZ, AMPHETMU, THCU, LABBARB    IMAGING I have personally reviewed the radiological images below and agree with the radiology interpretations.  Ct Angio Head and Neck and CT perfusion 05/04/2016 Thrombus with occlusion of the right carotid terminus with poor flow to the right middle cerebral artery branches (collateral flow to distal right middle cerebral artery branches). This is causing decreased caliber of the right internal carotid artery petrous and cavernous segment without high-grade proximal stenosis.  Large area of ischemia involving the right middle cerebral artery distribution.   Ct Head Wo Contrast 05/04/2016 HEAD CT:  1. Hyperdense right  middle cerebral artery sign accompanied by subtle decreased attenuation along the right insular cortex. This suggests a recent right middle cerebral artery infarct due to an M1 segment embolus/thrombus. Recommend follow-up brain MRI.  2. No other acute intracranial abnormalities. No intracranial hemorrhage.   Mr Brian [name] Head/brain Wo Cm 05/05/2016 1. Acute infarct of the right caudate head. No midline shift or significant mass effect.  2. Focal susceptibility within the right basal ganglia in the location of intraparenchymal hyperattenuation seen on the prior head CT. This is likely due to the presence of  deoxyhemoglobin, favoring petechial hemorrhage over contrast staining.  3. Normal MRA of the circle of Willis.   Dg Chest Port 1 View 05/05/2016 Minimal bibasilar atelectasis. Enlargement cardiac silhouette post MVR. Electronically Signed     Ct Head Code Stroke Wo Contrast` 05/04/2016 1. Mild blush of hyperdensity involving the right basal ganglia, most consistent with contrast staining status post recent catheter directed arteriogram.  No definite acute intracranial hemorrhage identified, although close interval follow-up is warranted. 2. No other significant evolving right MCA territory infarct identified at this time. 3. No other new acute intracranial process.   2-D Echo pending   PHYSICAL EXAM  Temp:  [97.5 F (36.4 C)-99.6 F (37.6 C)] 97.5 F (36.4 C) (09/17 1200) Pulse Rate:  [35-110] 35 (09/17 0800) Resp:  [11-30] 17 (09/17 0800) BP: (97-131)/(46-100) 116/57 (09/17 1204) SpO2:  [86 %-100 %] 98 % (09/17 0800)  General - Well nourished, well developed, sitting up in bedside chair  Ophthalmologic - Fundi not visualized.  Cardiovascular - irregularly irregular heart rate and rhythm.  Neuro - awake, alert,   eyes open on voice, follows all central and peripheral commands. PERRL, blinking to visual threat bilaterally, positive corneal and gag reflexes. RUE and RLE 5/5, LLE 5/5 and LUE 4/5 with 3/5 finger grasp and difficulty with dexterity. Sensation symmetrical bilaterally, FTN intact bilaterally although slow on the left side. DTR 1+ and no Babinski. Gait not tested    ASSESSMENT/PLAN [name] is a 82 y.o. male with history of previous TIA, history of venous thrombosis and embolism, hypertension, previous GI bleed, epistaxis, anemia and atrial flutter with warfarin therapy ( INR 2.8 at time of admission) presenting with left hemiparesis. He did not receive IV t-PA due to late presentation and warfarin.  Stroke:  Right BG infarct with petechiae MRI transformation  embolic due to right MCA occlusion from A. Fib s/p mech embolectomy and recanalization  Resultant  intubated, right upper extremity mild paresis  MRI - Acute infarct of the right BG with petechiae hemorrhagic transformation.  MRA - Normal MRA of the circle of Willis.   CTA head and neck - right ICA terminus occlusion  Cerebral angiogram - complete revascularization of occluded right MCA, TICI 2b/TICI 3 reperfusion 2D Echo - Left ventricle: The cavity size was normal. Wall thickness was   normal. Systolic function was normal. The estimated ejection   fraction was in the range of 55% to 60%. Wall motion was normal;    there were no regional wall motion abnormalities  LDL - 62  HgbA1c 5.3  VTE prophylaxis - SCDs and INR 2.88 Diet NPO time specified  warfarin daily prior to admission, currently Coumadin on hold but INR 2.88. Due to hemorrhagic transformation with high INR, will use Kcentra to reverse INR to avoid further hemorrhagic transformation.  Removal sheath after INR normalized  Hopefully extubate after sheath removal  Once INR normalizes, start aspirin for stroke prevention and  subcutaneous heparin for DVT prophylaxis.  Repeat CT in a.m., if no frank hematoma, may consider to start DOACs for stroke prevention.   Plan discussed with   son-in-law   who is a critical physician in [place]. They are in agreement.  Patient counseled to be compliant with his antithrombotic medications  Ongoing aggressive stroke risk factor management  Therapy recommendations: pending  Disposition: pending  Persistent A. fib on Coumadin  INR on admission 2.8  Coumadin on hold, however, INR in a.m. 2.88  Reversal with Kcentra to avoid further hemorrhagic transformation  May consider DOACs after 1 week post stroke  History of GI bleeding  Has been years ago  No recent GI bleeding on Coumadin  Hb 13.7 -> 11.3  Close monitoring   CBC in a.m.  Hypertension  Blood  pressure running somewhat low  Permissive hypertension (OK if < 180/105 due to high INR) but gradually normalize in 5-7 days  Long-term BP goal normotensive  Hyperlipidemia  Home meds:  No lipid lowering medications prior to admission  LDL 62, goal < 70  Other Stroke Risk Factors  Advanced age  Cigarette smoker - already quit  Hx of TIA  Other Active Problems  Mild thrombocytopenia, platelet 128  Hypokalemia - 3.2, supplement  Hospital day # 3 Plan Transfer to neurology floor bed. Speech therapy consult for swallowing . Mobilize out of bed as tolerated. Therapy and rehab consults.D/W patient and son in law  at bedside. This patient is critically ill due to right MCA occlusion s/p IR, A. fib on Coumadin with therapeutic INR, history of GI bleeding, and at significant risk of neurological worsening, death form hemorrhagic transformation, recurrent stroke, respiratory failure, heart failure, further GI bleeding. This patient's care requires constant monitoring of vital signs, hemodynamics, respiratory and cardiac monitoring, review of multiple databases, neurological assessment, discussion with family, other specialists and medical decision making of high complexity. I spent 41minutes of neurocritical care time in the care of this patient. I had long discussion with son in law at bedside  , updating patient condition, treatment plan and possible prognosis. Discussed with Dr. Titus Mould.  Antony Contras, MD Stroke Neurology 05/07/2016  1:29 PM    To contact Stroke Continuity provider, please refer to [email or web address] After hours, contact General Neurology

## 2016-05-07 NOTE — Evaluation (Signed)
Clinical/Bedside Swallow Evaluation Patient Details  Name: Brian [name] MRN: [id number] Date of Birth: [birth year]  Today's Date: 05/07/2016 Time: SLP Start Time (ACUTE ONLY): 0945 SLP Stop Time (ACUTE ONLY): 1025 SLP Time Calculation (min) (ACUTE ONLY): 40 min  Past Medical History:  Past Medical History:  Diagnosis Date  . Anemia   . Atrial flutter ([place])   . BPH (benign prostatic hypertrophy)   . Epistaxis   . Hemorrhage of gastrointestinal tract, unspecified   . Hypertension   . Osteoarthrosis, unspecified whether generalized or localized, unspecified site   . Personal history of venous thrombosis and embolism   . Rosacea   . TIA (transient ischemic attack)    Past Surgical History:  Past Surgical History:  Procedure Laterality Date  . APPENDECTOMY    . HERNIA REPAIR    . JOINT REPLACEMENT    . MITRAL VALVE REPAIR    . mvp repair    . RADIOLOGY WITH ANESTHESIA N/A 05/04/2016   Procedure: RADIOLOGY WITH ANESTHESIA;  Surgeon: [name], MD;  Location: [facility];  Service: Radiology;  Laterality: N/A;  . TOTAL HIP ARTHROPLASTY     HPI:  Brian [name] an 82 y.o.malewith past medical history of TIA, hypertension, atrial flutter.Patient was brought to the emergency department as code stroke after he was found down in the kitchen with left-sided hemiparesis.Patient's last known normal was 6:15 PM the prior night. Per son as 6:15 this morning patient called out to him and he was found on the floor not moving his left side. Patient was brought in for immediate CT scan which showed a right hyperdense MCA.He was subsequently taken to IR for neuro intervention. Intubated until 9/16.    Assessment / Plan / Recommendation Clinical Impression  Pt demonstrates signs of oral dysphagia with oral residuals on the left, as well as delayed swallow initaition and suspected sensory deficits. WIth 3 oz water test pt immediately coughed suggesting aspiration. Given probable delay in  objective testing, attempted trials of puree with max cueing for head tilt right and multiple swallows if oral meds are needed. No wet vocal quality or late coughing observed with this method. Will f/u with MBS when possible.     Aspiration Risk  Moderate aspiration risk    Diet Recommendation NPO except meds   Medication Administration: Crushed with puree Supervision: Full supervision/cueing for compensatory strategies Compensations: Slow rate;Small sips/bites;Multiple dry swallows after each bite/sip (tilt head right) Postural Changes: Seated upright at 90 degrees    Other  Recommendations     Follow up Recommendations Inpatient Rehab      Frequency and Duration min 2x/week  2 weeks       Prognosis Prognosis for Safe Diet Advancement: Good      Swallow Study   General HPI: Brian [name] an 82 y.o.malewith past medical history of TIA, hypertension, atrial flutter.Patient was brought to the emergency department as code stroke after he was found down in the kitchen with left-sided hemiparesis.Patient's last known normal was 6:15 PM the prior night. Per son as 6:15 this morning patient called out to him and he was found on the floor not moving his left side. Patient was brought in for immediate CT scan which showed a right hyperdense MCA.He was subsequently taken to IR for neuro intervention. Intubated until 9/16.  Type of Study: Bedside Swallow Evaluation Previous Swallow Assessment: none Diet Prior to this Study: NPO Temperature Spikes Noted: No Respiratory Status: Room air History of Recent Intubation: Yes Length  of Intubations (days): 3 days Date extubated: 05/06/16 Behavior/Cognition: Cooperative;Pleasant mood;Requires cueing Oral Care Completed by SLP: Yes Oral Cavity - Dentition: Adequate natural dentition Vision: Functional for self-feeding Self-Feeding Abilities: Needs assist Patient Positioning: Upright in chair Baseline Vocal Quality: Low vocal  intensity Volitional Cough: Weak Volitional Swallow: Able to elicit    Oral/Motor/Sensory Function Overall Oral Motor/Sensory Function: Moderate impairment Facial ROM: Reduced left;Suspected CN VII (facial) dysfunction Facial Symmetry: Abnormal symmetry left;Suspected CN VII (facial) dysfunction Facial Strength: Reduced left;Suspected CN VII (facial) dysfunction Facial Sensation: Within Functional Limits Lingual ROM:  (generalized weakness) Lingual Symmetry: Abnormal symmetry left Lingual Strength:  (weakness) Lingual Sensation: Suspected CN VII (facial) dysfunction-anterior 2/3 tongue Velum: Within Functional Limits Mandible: Within Functional Limits   Ice Chips     Thin Liquid Thin Liquid: Impaired Presentation: Cup;Straw Oral Phase Impairments:  (anterior spill after swallows) Oral Phase Functional Implications: Left anterior spillage Pharyngeal  Phase Impairments: Cough - Immediate    Nectar Thick Nectar Thick Liquid: Not tested   Honey Thick Honey Thick Liquid: Not tested   Puree Puree: Impaired Presentation: Spoon Oral Phase Impairments: Reduced lingual movement/coordination Oral Phase Functional Implications: Left lateral sulci pocketing;Prolonged oral transit;Oral residue   Solid   GO   Solid: Not tested       Herbie Baltimore, MA CCC-SLP [phone]  Keelan Tripodi, Katherene Ponto 05/07/2016,10:33 AM

## 2016-05-07 NOTE — Progress Notes (Signed)
Patient ID: Brian [name], male   DOB: 1933-06-24, 82 y.o.   MRN: [id number]    Referring Physician(s): Dr. Roland Rack  Supervising Physician: [name]  Patient Status: inpt  Chief Complaint: (R) MCA CVA  Subjective: Patient sitting up in chair and wanting to go home.    Allergies: Novocain [procaine hcl] and Penicillins  Medications: Prior to Admission medications   Medication Sig Start Date End Date Taking? Authorizing Provider  ALPRAZolam (XANAX) 0.25 MG tablet Take 1 tablet (0.25 mg total) by mouth at bedtime. 03/22/16  Yes Marletta Lor, MD  benazepril-hydrochlorthiazide (LOTENSIN HCT) 5-6.25 MG tablet Take 1 tablet by mouth daily. 03/22/16  Yes Marletta Lor, MD  clindamycin (CLEOCIN) 150 MG capsule TAKE 2 CAPSULES BY MOUTH PRIOR TO DENTAL WORK AS DIRECTED 11/10/15  Yes Marletta Lor, MD  doxazosin (CARDURA) 1 MG tablet TAKE 1 TABLET BY MOUTH AT BEDTIME 03/22/16  Yes Marletta Lor, MD  doxycycline (VIBRA-TABS) 100 MG tablet TAKE 1 TABLET BY MOUTH TWICE DAILY 03/08/16  Yes Marletta Lor, MD  finasteride (PROSCAR) 5 MG tablet Take 1 tablet (5 mg total) by mouth daily. 03/22/16  Yes Marletta Lor, MD  metoprolol succinate (TOPROL-XL) 25 MG 24 hr tablet TAKE 1 TABLET BY MOUTH EVERY MORNING AND 2 TABLETS BY MOUTH EVERY EVENING 03/22/16  Yes Marletta Lor, MD  pantoprazole (PROTONIX) 40 MG tablet Take 1 tablet (40 mg total) by mouth daily. 03/22/16  Yes Marletta Lor, MD  PROCTOZONE-HC 2.5 % rectal cream USE RECTALLY TWICE DAILY 08/04/15  Yes Marletta Lor, MD  saw palmetto 160 MG capsule Take 1 capsule (160 mg total) by mouth 2 (two) times daily. 01/26/14  Yes Ricard Dillon, MD  warfarin (COUMADIN) 5 MG tablet Take 1 tablet by mouth daily or as directed. 03/22/16  Yes Marletta Lor, MD    Vital Signs: BP (!) 97/46 (BP Location: Right Arm)   Pulse (!) 35   Temp 99.6 F (37.6 C) (Axillary)   Resp 17   Ht 5\' 7"   (1.702 m)   Wt 158 lb 1.1 oz (71.7 kg)   SpO2 98%   BMI 24.76 kg/m   Physical Exam: Neuro: still with left sided facial droop and LEU weakness. Skin: right inguinal site with ecchymosis present that has spread some, but is minimally tender  Imaging: Ct Angio Head W Or Wo Contrast  Result Date: 05/04/2016 CLINICAL DATA:  82 year old male with left-sided weakness and facial droop. Subsequent encounter. EXAM: CT ANGIOGRAPHY HEAD AND NECK.  CT PERFUSION (RAPID). TECHNIQUE: Multidetector CT imaging of the head and neck was performed using the standard protocol during bolus administration of intravenous contrast. Multiplanar CT image reconstructions and MIPs were obtained to evaluate the vascular anatomy. Carotid stenosis measurements (when applicable) are obtained utilizing NASCET criteria, using the distal internal carotid diameter as the denominator. CONTRAST:  100 cc Omnipaque 370. COMPARISON:  05/04/2016 CT head and cervical spine CT. FINDINGS: CT HEAD RAPID. PERFUSION: Large right middle cerebral artery distribution decreased profusion with small areas of possible infarction. RAPID findings with T-max greater than 6 seconds of 177 cc and CBF less than 30% of 0 cc. Overall mismatch volume of 177 cc. Brain: Large right middle cerebral artery distribution region of ischemia. No intracranial hemorrhage. No intracranial mass. Calvarium and skull base: No acute abnormality. Paranasal sinuses: Partial opacification left maxillary sinus and mucosal thickening/partial opacification ethmoid sinus air cells bilaterally. Orbits: No acute abnormality. CTA NECK Aortic  arch: 3 vessel arch. Plaque right innominate artery origin without significant stenosis. Minimal plaque left subclavian artery origin. Right carotid system: Plaque right carotid bifurcation without significant stenosis. Ectatic distal cervical segment. Decreased flow proximal to the skullbase. Left carotid system: Plaque left carotid bifurcation without  hemodynamically significant stenosis. Ectatic distal cervical segment. Vertebral arteries:Mild narrowing origin left vertebral artery. Right vertebral artery slightly dominant in size. Skeleton: Cervical spondylotic changes. Other neck: No worrisome neck mass. Upper chest: No worrisome lung apical mass. CTA HEAD Anterior circulation: Decreased flow within the right internal carotid artery cavernous segment. This is most likely related to thrombus in the right carotid terminus with poor flow to right middle cerebral artery branches (collateral flow noted along the periphery of the right middle cerebral artery distal branches). No significant stenosis left internal carotid artery, left middle cerebral artery or left anterior cerebral artery. Posterior circulation: No significant stenosis vertebral arteries or basilar artery. Venous sinuses: Patent Anatomic variants: Negative. Delayed phase: As above. 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 This is causing decreased caliber of the right internal carotid artery petrous and cavernous segment without high-grade proximal stenosis. Large area of ischemia involving the right middle cerebral artery distribution. Findings suggest significant reversible component. Images reviewed with Dr. Leonel Ramsay at times imaging. Electronically Signed   By: Genia Del M.D.   On: 05/04/2016 10:51   Ct Head Wo Contrast  Result Date: 05/06/2016 CLINICAL DATA:  82 y/o  M; follow-up of hemorrhage. EXAM: CT HEAD WITHOUT CONTRAST TECHNIQUE: Contiguous axial images were obtained from the base of the skull through the vertex without intravenous contrast. COMPARISON:  05/05/2016 MR brain.  05/04/2016 CT head. FINDINGS: Brain: Stable focus of hemorrhage in the right basal ganglia with mild surrounding vasogenic edema and local mass effect  centered in a small area of infarction better characterized on the prior MRI head. No evidence for new large territory infarct or intracranial hemorrhage. Background mild chronic microvascular ischemic changes and parenchymal volume loss. Vascular: No hyperdense vessel or unexpected calcification. Skull: Normal. Negative for fracture or focal lesion. Sinuses/Orbits: Left maxillary sinus mucous retention cyst. Fluid levels in the sphenoid sinuses is probably due to intubation. Normally aerated mastoid air cells. Bilateral intra-ocular lens replacement. Other: None. IMPRESSION: Small hemorrhage within the right basal ganglia region of infarction is stable in comparison with the prior MRI given differences in technique. No new hemorrhage or large territory infarct is identified. Electronically Signed   By: Kristine Garbe M.D.   On: 05/06/2016 05:18   Ct Head Wo Contrast  Result Date: 05/04/2016 CLINICAL DATA:  Pt was found down on floor, pt with left sided neglect and facial droop, pt was last seen normal at 2200 last night EXAM: CT HEAD WITHOUT CONTRAST CT CERVICAL SPINE WITHOUT CONTRAST TECHNIQUE: Multidetector CT imaging of the head and cervical spine was performed following the standard protocol without intravenous contrast. Multiplanar CT image reconstructions of the cervical spine were also generated. COMPARISON:  None. FINDINGS: CT HEAD FINDINGS Brain: The ventricles are normal in size, for this patient's age, and normal in configuration. There are no parenchymal masses or mass effect. There is subtle decreased attenuation along the right insular cortex. No other parenchymal evidence of a recent infarct. No extra-axial masses or abnormal fluid collections. No intracranial hemorrhage. Vascular: There is a dense M1 segment of the right middle cerebral artery concerning  for thrombus. Skull: No skull fracture or lesion. Sinuses/Orbits: Mild left maxillary mucosal thickening. Moderate ethmoid sinus  mucosal thickening. Mild sphenoid and inferior frontal sinus mucosal thickening. Globes and orbits are unremarkable other than changes from cataract surgery. Other: Clear mastoid air cells. CT CERVICAL SPINE FINDINGS Alignment: Normal. Skull base and vertebrae: Skullbase alignment with the cervical spine is normal. No fractures. No bone lesions. Bones are diffusely demineralized. Soft tissues and spinal canal: No soft tissue masses or adenopathy. There are carotid artery vascular calcifications. Disc levels: There is loss of disc height with endplate sclerosis and spurring at all levels ranging from moderate to severe, greatest at C7-T1. Uncovertebral spurring leads to varying degrees of neural foraminal narrowing, which appears greatest on the left at C4-C5, moderate severity. Upper chest: Lung apices are clear. Other: None IMPRESSION: HEAD CT: 1. Hyperdense right middle cerebral artery sign accompanied by subtle decreased attenuation along the right insular cortex. This suggests a recent right middle cerebral artery infarct due to an M1 segment embolus/thrombus. Recommend follow-up brain MRI. 2. No other acute intracranial abnormalities. No intracranial hemorrhage. CERVICAL CT: 1. No fracture, spondylolisthesis or acute finding. Electronically Signed   By: Lajean Manes M.D.   On: 05/04/2016 09:18   Ct Angio Neck W Or Wo Contrast  Result Date: 05/04/2016 CLINICAL DATA:  82 year old male with left-sided weakness and facial droop. Subsequent encounter. EXAM: CT ANGIOGRAPHY HEAD AND NECK.  CT PERFUSION (RAPID). TECHNIQUE: Multidetector CT imaging of the head and neck was performed using the standard protocol during bolus administration of intravenous contrast. Multiplanar CT image reconstructions and MIPs were obtained to evaluate the vascular anatomy. Carotid stenosis measurements (when applicable) are obtained utilizing NASCET criteria, using the distal internal carotid diameter as the denominator. CONTRAST:   100 cc Omnipaque 370. COMPARISON:  05/04/2016 CT head and cervical spine CT. FINDINGS: CT HEAD RAPID. PERFUSION: Large right middle cerebral artery distribution decreased profusion with small areas of possible infarction. RAPID findings with T-max greater than 6 seconds of 177 cc and CBF less than 30% of 0 cc. Overall mismatch volume of 177 cc. Brain: Large right middle cerebral artery distribution region of ischemia. No intracranial hemorrhage. No intracranial mass. Calvarium and skull base: No acute abnormality. Paranasal sinuses: Partial opacification left maxillary sinus and mucosal thickening/partial opacification ethmoid sinus air cells bilaterally. Orbits: No acute abnormality. CTA NECK Aortic arch: 3 vessel arch. Plaque right innominate artery origin without significant stenosis. Minimal plaque left subclavian artery origin. Right carotid system: Plaque right carotid bifurcation without significant stenosis. Ectatic distal cervical segment. Decreased flow proximal to the skullbase. Left carotid system: Plaque left carotid bifurcation without hemodynamically significant stenosis. Ectatic distal cervical segment. Vertebral arteries:Mild narrowing origin left vertebral artery. Right vertebral artery slightly dominant in size. Skeleton: Cervical spondylotic changes. Other neck: No worrisome neck mass. Upper chest: No worrisome lung apical mass. CTA HEAD Anterior circulation: Decreased flow within the right internal carotid artery cavernous segment. This is most likely related to thrombus in the right carotid terminus with poor flow to right middle cerebral artery branches (collateral flow noted along the periphery of the right middle cerebral artery distal branches). No significant stenosis left internal carotid artery, left middle cerebral artery or left anterior cerebral artery. Posterior circulation: No significant stenosis vertebral arteries or basilar artery. Venous sinuses: Patent Anatomic variants:  Negative. Delayed phase: As above. 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 This is causing decreased caliber of the right internal carotid artery petrous and cavernous segment without high-grade proximal stenosis. Large area of ischemia involving the right middle cerebral artery distribution. Findings suggest significant reversible component. Images reviewed with Dr. Leonel Ramsay at times imaging. Electronically Signed   By: Genia Del M.D.   On: 05/04/2016 10:51   Ct Cervical Spine Wo Contrast  Result Date: 05/04/2016 CLINICAL DATA:  Pt was found down on floor, pt with left sided neglect and facial droop, pt was last seen normal at 2200 last night EXAM: CT HEAD WITHOUT CONTRAST CT CERVICAL SPINE WITHOUT CONTRAST TECHNIQUE: Multidetector CT imaging of the head and cervical spine was performed following the standard protocol without intravenous contrast. Multiplanar CT image reconstructions of the cervical spine were also generated. COMPARISON:  None. FINDINGS: CT HEAD FINDINGS Brain: The ventricles are normal in size, for this patient's age, and normal in configuration. There are no parenchymal masses or mass effect. There is subtle decreased attenuation along the right insular cortex. No other parenchymal evidence of a recent infarct. No extra-axial masses or abnormal fluid collections. No intracranial hemorrhage. Vascular: There is a dense M1 segment of the right middle cerebral artery concerning for thrombus. Skull: No skull fracture or lesion. Sinuses/Orbits: Mild left maxillary mucosal thickening. Moderate ethmoid sinus mucosal thickening. Mild sphenoid and inferior frontal sinus mucosal thickening. Globes and orbits are unremarkable other than changes from cataract surgery. Other: Clear mastoid air cells. CT CERVICAL SPINE FINDINGS Alignment: Normal.  Skull base and vertebrae: Skullbase alignment with the cervical spine is normal. No fractures. No bone lesions. Bones are diffusely demineralized. Soft tissues and spinal canal: No soft tissue masses or adenopathy. There are carotid artery vascular calcifications. Disc levels: There is loss of disc height with endplate sclerosis and spurring at all levels ranging from moderate to severe, greatest at C7-T1. Uncovertebral spurring leads to varying degrees of neural foraminal narrowing, which appears greatest on the left at C4-C5, moderate severity. Upper chest: Lung apices are clear. Other: None IMPRESSION: HEAD CT: 1. Hyperdense right middle cerebral artery sign accompanied by subtle decreased attenuation along the right insular cortex. This suggests a recent right middle cerebral artery infarct due to an M1 segment embolus/thrombus. Recommend follow-up brain MRI. 2. No other acute intracranial abnormalities. No intracranial hemorrhage. CERVICAL CT: 1. No fracture, spondylolisthesis or acute finding. Electronically Signed   By: Lajean Manes M.D.   On: 05/04/2016 09:18   Mr Brain Wo Contrast  Result Date: 05/05/2016 CLINICAL DATA:  Left-sided hemi paresis. EXAM: MRI HEAD WITHOUT CONTRAST MRA HEAD WITHOUT CONTRAST TECHNIQUE: Multiplanar, multiecho pulse sequences of the brain and surrounding structures were obtained without intravenous contrast. Angiographic images of the head were obtained using MRA technique without contrast. COMPARISON:  Head CT 05/04/2016 FINDINGS: MRI HEAD FINDINGS Brain: There is focal diffusion restriction within the right caudate head. Gradient echo imaging shows focal susceptibility within the right basal ganglia in the distribution of the hyperdensity demonstrated on the recent head CT. The midline structures are normal. There is multifocal hyperintense T2 weighted signal within the subcortical white matter. There is mild mass effect on the frontal horn of the right lateral ventricle. No  midline shift or evidence of herniation. No hydrocephalus. Vascular: As above, focal susceptibility within the right basal ganglia. No evidence of chronic microhemorrhage or amyloid angiopathy. Skull and upper cervical spine: The visualized skull base, calvarium, upper cervical spine and extracranial soft tissues are normal. Sinuses/Orbits: Fluid within the sphenoid  sinuses. Maxillary mucosal thickening. Bilateral lens replacements. MRA HEAD FINDINGS Intracranial internal carotid arteries: Normal. Anterior cerebral arteries: Normal. Middle cerebral arteries: Normal. Posterior communicating arteries: Not visualized. Posterior cerebral arteries: Normal. Basilar artery: Normal. Vertebral arteries: Codominant. Normal. Superior cerebellar arteries: Normal. Anterior inferior cerebellar arteries: Normal on the right. Not visualized on the left. Posterior inferior cerebellar arteries: Normal. IMPRESSION: 1. Acute infarct of the right caudate head. No midline shift or significant mass effect. 2. Focal susceptibility within the right basal ganglia in the location of intraparenchymal hyperattenuation seen on the prior head CT. This is likely due to the presence of deoxyhemoglobin, favoring petechial hemorrhage over contrast staining. Short interval follow-up head CT is recommended, as contrast extravasation may be expected to clear 24 hours following angiography. 3. Normal MRA of the circle of Willis. Electronically Signed   By: Ulyses Jarred M.D.   On: 05/05/2016 04:33   Ct Cerebral Perfusion W Contrast  Result Date: 05/04/2016 CLINICAL DATA:  82 year old male with left-sided weakness and facial droop. Subsequent encounter. EXAM: CT ANGIOGRAPHY HEAD AND NECK.  CT PERFUSION (RAPID). TECHNIQUE: Multidetector CT imaging of the head and neck was performed using the standard protocol during bolus administration of intravenous contrast. Multiplanar CT image reconstructions and MIPs were obtained to evaluate the vascular  anatomy. Carotid stenosis measurements (when applicable) are obtained utilizing NASCET criteria, using the distal internal carotid diameter as the denominator. CONTRAST:  100 cc Omnipaque 370. COMPARISON:  05/04/2016 CT head and cervical spine CT. FINDINGS: CT HEAD RAPID. PERFUSION: Large right middle cerebral artery distribution decreased profusion with small areas of possible infarction. RAPID findings with T-max greater than 6 seconds of 177 cc and CBF less than 30% of 0 cc. Overall mismatch volume of 177 cc. Brain: Large right middle cerebral artery distribution region of ischemia. No intracranial hemorrhage. No intracranial mass. Calvarium and skull base: No acute abnormality. Paranasal sinuses: Partial opacification left maxillary sinus and mucosal thickening/partial opacification ethmoid sinus air cells bilaterally. Orbits: No acute abnormality. CTA NECK Aortic arch: 3 vessel arch. Plaque right innominate artery origin without significant stenosis. Minimal plaque left subclavian artery origin. Right carotid system: Plaque right carotid bifurcation without significant stenosis. Ectatic distal cervical segment. Decreased flow proximal to the skullbase. Left carotid system: Plaque left carotid bifurcation without hemodynamically significant stenosis. Ectatic distal cervical segment. Vertebral arteries:Mild narrowing origin left vertebral artery. Right vertebral artery slightly dominant in size. Skeleton: Cervical spondylotic changes. Other neck: No worrisome neck mass. Upper chest: No worrisome lung apical mass. CTA HEAD Anterior circulation: Decreased flow within the right internal carotid artery cavernous segment. This is most likely related to thrombus in the right carotid terminus with poor flow to right middle cerebral artery branches (collateral flow noted along the periphery of the right middle cerebral artery distal branches). No significant stenosis left internal carotid artery, left middle cerebral  artery or left anterior cerebral artery. Posterior circulation: No significant stenosis vertebral arteries or basilar artery. Venous sinuses: Patent Anatomic variants: Negative. Delayed phase: As above. 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 This is causing decreased caliber of the right internal carotid artery petrous and cavernous segment without high-grade proximal stenosis. Large area of ischemia involving the right middle cerebral artery distribution. Findings suggest significant reversible component. Images reviewed with Dr. Leonel Ramsay at times imaging. Electronically Signed   By: Genia Del M.D.   On: 05/04/2016  10:51   Dg Chest Port 1 View  Result Date: 05/06/2016 CLINICAL DATA:  Respiratory failure EXAM: PORTABLE CHEST 1 VIEW COMPARISON:  05/05/2016 FINDINGS: Endotracheal tube unchanged. Introduction of NG tube which extends in the course of the stomach below the level the found. LEFT basilar atelectasis and effusion. No pneumothorax. IMPRESSION: 1. Endotracheal tube unchanged.  Introduction of NG tube. 2. LEFT lower lobe effusion and atelectasis. Electronically Signed   By: Suzy Bouchard M.D.   On: 05/06/2016 08:17   Dg Chest Port 1 View  Result Date: 05/05/2016 CLINICAL DATA:  Intubation, history hypertension, atrial flutter, stroke EXAM: PORTABLE CHEST 1 VIEW COMPARISON:  Portable exam 0619 hours compared to 05/04/2016 FINDINGS: Tip of endotracheal tube projects 6.8 cm above carina. Enlargement of cardiac silhouette post MVR. Mediastinal contours and pulmonary vascularity normal. Minimal bibasilar atelectasis. LEFT costophrenic angle excluded. No gross infiltrate, pleural effusion or pneumothorax. Minimal central peribronchial thickening. BILATERAL glenohumeral degenerative changes. IMPRESSION: Minimal bibasilar atelectasis. Enlargement  cardiac silhouette post MVR. Electronically Signed   By: Lavonia Dana M.D.   On: 05/05/2016 09:03   Dg Chest Port 1 View  Result Date: 05/04/2016 CLINICAL DATA:  Endotracheal tube placement. EXAM: PORTABLE CHEST 1 VIEW COMPARISON:  None. FINDINGS: Endotracheal tube tip projects 5 cm above the carina. There changes from cardiac surgery and valve replacement. Cardiac silhouette is mildly enlarged. Aorta is uncoiled and prominent. No mediastinal or hilar masses or convincing adenopathy. Lungs are clear.  No pleural effusion or pneumothorax. Skeletal structures are demineralized. There are arthropathic changes of the right glenohumeral joint and significant narrowing of the subacromial space consistent with chronic full-thickness rotator cuff tear. IMPRESSION: 1. No acute cardiopulmonary disease. 2. Endotracheal tube tip projects 5 cm above the carina. Electronically Signed   By: Lajean Manes M.D.   On: 05/04/2016 16:52   Dg Abd Portable 1v  Result Date: 05/05/2016 CLINICAL DATA:  OG tube placement. EXAM: PORTABLE ABDOMEN - 1 VIEW COMPARISON:  Chest x-ray today. FINDINGS: High KUB demonstrates interval placement of a nasogastric tube which is coiled months with tip and side-port over the stomach in the left upper quadrant. Visualized portions of the chest are unchanged. Upper abdomen demonstrates nonobstructive bowel gas pattern. IMPRESSION: Interval placement of nasogastric tube with tip and side-port over the stomach in the left upper quadrant. Electronically Signed   By: Marin Olp M.D.   On: 05/05/2016 20:22   Mr Jodene Nam Head/brain [id number] Cm  Result Date: 05/05/2016 CLINICAL DATA:  Left-sided hemi paresis. EXAM: MRI HEAD WITHOUT CONTRAST MRA HEAD WITHOUT CONTRAST TECHNIQUE: Multiplanar, multiecho pulse sequences of the brain and surrounding structures were obtained without intravenous contrast. Angiographic images of the head were obtained using MRA technique without contrast. COMPARISON:  Head CT 05/04/2016  FINDINGS: MRI HEAD FINDINGS Brain: There is focal diffusion restriction within the right caudate head. Gradient echo imaging shows focal susceptibility within the right basal ganglia in the distribution of the hyperdensity demonstrated on the recent head CT. The midline structures are normal. There is multifocal hyperintense T2 weighted signal within the subcortical white matter. There is mild mass effect on the frontal horn of the right lateral ventricle. No midline shift or evidence of herniation. No hydrocephalus. Vascular: As above, focal susceptibility within the right basal ganglia. No evidence of chronic microhemorrhage or amyloid angiopathy. Skull and upper cervical spine: The visualized skull base, calvarium, upper cervical spine and extracranial soft tissues are normal. Sinuses/Orbits: Fluid within the sphenoid sinuses. Maxillary mucosal thickening. Bilateral lens replacements. MRA  HEAD FINDINGS Intracranial internal carotid arteries: Normal. Anterior cerebral arteries: Normal. Middle cerebral arteries: Normal. Posterior communicating arteries: Not visualized. Posterior cerebral arteries: Normal. Basilar artery: Normal. Vertebral arteries: Codominant. Normal. Superior cerebellar arteries: Normal. Anterior inferior cerebellar arteries: Normal on the right. Not visualized on the left. Posterior inferior cerebellar arteries: Normal. IMPRESSION: 1. Acute infarct of the right caudate head. No midline shift or significant mass effect. 2. Focal susceptibility within the right basal ganglia in the location of intraparenchymal hyperattenuation seen on the prior head CT. This is likely due to the presence of deoxyhemoglobin, favoring petechial hemorrhage over contrast staining. Short interval follow-up head CT is recommended, as contrast extravasation may be expected to clear 24 hours following angiography. 3. Normal MRA of the circle of Willis. Electronically Signed   By: Ulyses Jarred M.D.   On: 05/05/2016 04:33     Ct Head Code Stroke Wo Contrast`  Result Date: 05/04/2016 CLINICAL DATA:  Code stroke. Follow-up exam following right common carotid arteriogram with revascularization of occluded right M1 segment. EXAM: CT HEAD WITHOUT CONTRAST TECHNIQUE: Contiguous axial images were obtained from the base of the skull through the vertex without intravenous contrast. COMPARISON:  Prior studies from earlier the same day. FINDINGS: Blush of hyperdensity involves the right basal ganglia act, specifically the right caudate head and lentiform nucleus. Finding most consistent with contrast staining from recent catheter directed arteriogram rather than hemorrhage. IV contrast within the vascular structures of the brain noted. No definite acute intracranial hemorrhage. Gray-white matter differentiation otherwise maintained at this time without definite evidence for evolving ischemia. Stable atrophy with chronic microvascular ischemic disease. No mass lesion or midline shift. No mass effect. No hydrocephalus. No extra-axial fluid collection. Scalp soft tissues within normal limits. Globes and orbits demonstrate no acute abnormality. Patient status post lens extraction bilaterally. Scattered mucosal thickening throughout the maxillary sinuses and ethmoidal air cells as well as the sphenoid sinuses. Small fluid levels within the sphenoid sinuses present. No mastoid effusion. Calvarium intact. IMPRESSION: 1. Mild blush of hyperdensity involving the right basal ganglia, most consistent with contrast staining status post recent catheter directed arteriogram. No definite acute intracranial hemorrhage identified, although close interval follow-up is warranted. 2. No other significant evolving right MCA territory infarct identified at this time. 3. No other new acute intracranial process. Electronically Signed   By: Jeannine Boga M.D.   On: 05/04/2016 14:35    Labs:  CBC:  Recent Labs  09/22/15 0954 05/04/16 0920  05/04/16 0934 05/05/16 0500 05/06/16 0606  WBC 6.1 9.3  --  6.8 6.9  HGB 14.9 13.7 14.6 11.3* 10.8*  HCT 45.1 41.4 43.0 34.4* 34.0*  PLT 219.0 141*  --  128* 124*    COAGS:  Recent Labs  05/04/16 0920  05/06/16 0100 05/06/16 0606 05/06/16 1304 05/07/16 0753  INR 2.45  < > 1.32 1.30 1.23 1.32  APTT 35  --   --   --   --   --   < > = values in this interval not displayed.  BMP:  Recent Labs  05/04/16 0920 05/04/16 0934 05/05/16 0500 05/06/16 0606 05/07/16 0753  NA 140 140 141 142 144  K 3.7 3.6 3.2* 3.3* 3.3*  CL 106 101 113* 112* 109  CO2 26  --  24 24 28   GLUCOSE 113* 108* 88 111* 99  BUN 17 20 9 10 10   CALCIUM 9.0  --  8.2* 8.2* 8.3*  CREATININE 0.78 0.80 0.66 0.54* 0.71  GFRNONAA >60  --  >  60 >60 >60  GFRAA >60  --  >60 >60 >60    LIVER FUNCTION TESTS:  Recent Labs  09/22/15 0954 05/04/16 0920  BILITOT 0.7 1.0  AST 27 35  ALT 23 29  ALKPHOS 109 97  PROT 6.6 6.5  ALBUMIN 4.1 3.8    Assessment and Plan: 1. S/p revascularization of (r) MCA with post procedure hematoma of right inguinal site -ecchymosis has spread, but site is soft and relatively nontender.  Will follow -further care per CCM and neurology.  Electronically Signed: Henreitta Cea 05/07/2016, 11:19 AM   I spent a total of 15 Minutes at the the patient's bedside AND on the patient's hospital floor or unit, greater than 50% of which was counseling/coordinating care for s/p revascularization of (R) MCA

## 2016-05-08 ENCOUNTER — Inpatient Hospital Stay (HOSPITAL_COMMUNITY): Payer: Medicare Other

## 2016-05-08 DIAGNOSIS — Z9889 Other specified postprocedural states: Secondary | ICD-10-CM

## 2016-05-08 DIAGNOSIS — G459 Transient cerebral ischemic attack, unspecified: Secondary | ICD-10-CM

## 2016-05-08 DIAGNOSIS — I4892 Unspecified atrial flutter: Secondary | ICD-10-CM

## 2016-05-08 DIAGNOSIS — I4819 Other persistent atrial fibrillation: Secondary | ICD-10-CM

## 2016-05-08 DIAGNOSIS — R0682 Tachypnea, not elsewhere classified: Secondary | ICD-10-CM

## 2016-05-08 DIAGNOSIS — I69322 Dysarthria following cerebral infarction: Secondary | ICD-10-CM

## 2016-05-08 DIAGNOSIS — R1310 Dysphagia, unspecified: Secondary | ICD-10-CM

## 2016-05-08 DIAGNOSIS — I480 Paroxysmal atrial fibrillation: Secondary | ICD-10-CM

## 2016-05-08 DIAGNOSIS — I2510 Atherosclerotic heart disease of native coronary artery without angina pectoris: Secondary | ICD-10-CM

## 2016-05-08 DIAGNOSIS — E876 Hypokalemia: Secondary | ICD-10-CM

## 2016-05-08 DIAGNOSIS — D696 Thrombocytopenia, unspecified: Secondary | ICD-10-CM

## 2016-05-08 DIAGNOSIS — I69391 Dysphagia following cerebral infarction: Secondary | ICD-10-CM

## 2016-05-08 DIAGNOSIS — I63311 Cerebral infarction due to thrombosis of right middle cerebral artery: Principal | ICD-10-CM

## 2016-05-08 DIAGNOSIS — I10 Essential (primary) hypertension: Secondary | ICD-10-CM

## 2016-05-08 DIAGNOSIS — D62 Acute posthemorrhagic anemia: Secondary | ICD-10-CM

## 2016-05-08 LAB — PROTIME-INR
INR: 1.28
Prothrombin Time: 16 seconds — ABNORMAL HIGH (ref 11.4–15.2)

## 2016-05-08 MED ORDER — ACETAMINOPHEN 325 MG PO TABS
650.0000 mg | ORAL_TABLET | ORAL | Status: DC | PRN
  Administered 2016-05-08 – 2016-05-09 (×2): 650 mg via ORAL
  Filled 2016-05-08 (×2): qty 2

## 2016-05-08 MED ORDER — RESOURCE THICKENUP CLEAR PO POWD
ORAL | Status: DC | PRN
  Filled 2016-05-08: qty 125

## 2016-05-08 NOTE — Progress Notes (Signed)
Speech Language Pathology  Patient Details Name: Brian [name] MRN: [id number] DOB: 02-13-1933 Today's Date: 05/08/2016 Time:  -      MBS completed. Silent penetration to vocal cords. Left side pocketing with regular solids  Recommend: Dys 2 (minced-ground) and nectar thick liquids, NO chin tuck. Swallow 2-3 times, alternate liquids/solids, crush meds Full report will be documented.    Houston Siren 05/08/2016, 10:50 AM  Orbie Pyo Colvin Caroli.Ed [employer] [phone]

## 2016-05-08 NOTE — Progress Notes (Addendum)
Rehab admissions - I met with patient and I left rehab brochures at the bedside.  Patient would like to go directly home and not come to inpatient rehab.  He says he is better and could manage.  I will call his wife and discuss rehab options.  Currently rehab beds are full.  I will follow progress for now.  Call me for questions.  #[phone]  I spoke with patient's wife.  She is not sure if patient will need inpatient rehab stay.  She will discuss with family.  Please see my note above.  #[phone]

## 2016-05-08 NOTE — Consult Note (Signed)
Physical Medicine and Rehabilitation Consult Reason for Consult: Right MCA infarct Referring Physician: Dr. Leonie Man   HPI: Brian [name] is a 82 y.o. right handed male with history of hypertension, TIA, atrial flutter, CAD with mitral valve repair maintained on chronic Coumadin. Per chart review patient lives with spouse and son. 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 He is eager to return to work. One level home with 3 steps to entry. Wife with limited physical assistance. Son works during the day with his father. Presented 05/04/2016 after being found down in the kitchen of left-sided weakness and slurred speech. CT of the head showed hyperdense right middle cerebral artery infarct. CT cervical spine negative. CT cerebral perfusion scan showed thrombus with occlusion of right carotid terminus with poor flow to the right middle cerebral artery branches. CTA of the head showed no significant stenosis left internal carotid artery, left middle cerebral artery or left anterior cerebral artery. Echocardiogram with ejection fraction of 60% no wall motion abnormalities. Underwent revascularization of occluded right MCA M1 segment per interventional radiology 05/04/2016. MRI/MRA 05/05/2016 acute infarct right caudate head no midline shift or significant mass effect. Focal susceptibility within the right basal ganglia in the location of intraparenchymal hyperattenuation seen on the prior head CT. Normal MRA of the circle of Willis. Presently on subcutaneous heparin for DVT prophylaxis. Neurology consulted with workup ongoing. Physical occupational therapy evaluations completed with recommendations of physical medicine rehabilitation consult.   Review of Systems  Constitutional: Negative for chills and fever.  HENT: Positive for nosebleeds. Negative for hearing loss.   Eyes: Negative for blurred vision and double  vision.  Respiratory: Negative for cough and shortness of breath.   Cardiovascular: Negative for chest pain and claudication.  Gastrointestinal: Positive for constipation. Negative for nausea and vomiting.  Genitourinary: Positive for urgency. Negative for hematuria.  Musculoskeletal: Negative for joint pain and myalgias.  Skin: Negative for rash.  Neurological: Positive for speech change and weakness. Negative for seizures and headaches.  All other systems reviewed and are negative.  Past Medical History:  Diagnosis Date  . Anemia   . Atrial flutter ([place])   . BPH (benign prostatic hypertrophy)   . Epistaxis   . Hemorrhage of gastrointestinal tract, unspecified   . Hypertension   . Osteoarthrosis, unspecified whether generalized or localized, unspecified site   . Personal history of venous thrombosis and embolism   . Rosacea   . TIA (transient ischemic attack)    Past Surgical History:  Procedure Laterality Date  . APPENDECTOMY    . HERNIA REPAIR    . JOINT REPLACEMENT    . MITRAL VALVE REPAIR    . mvp repair    . RADIOLOGY WITH ANESTHESIA N/A 05/04/2016   Procedure: RADIOLOGY WITH ANESTHESIA;  Surgeon: [name], MD;  Location: [facility];  Service: Radiology;  Laterality: N/A;  . TOTAL HIP ARTHROPLASTY     Family History  Problem Relation Age of Onset  . Mitral valve prolapse Mother   . Coronary artery disease Father    Social History:  reports that he has quit smoking. He does not have any smokeless tobacco history on file. He reports that he does not drink alcohol or use drugs. Allergies:  Allergies  Allergen Reactions  . Novocain [Procaine Hcl]     Irregular heart beat     . Penicillins     REACTION: itching   Medications  Prior to Admission  Medication Sig Dispense Refill  . ALPRAZolam (XANAX) 0.25 MG tablet Take 1 tablet (0.25 mg total) by mouth at bedtime. 90 tablet 1  . benazepril-hydrochlorthiazide (LOTENSIN HCT) 5-6.25 MG tablet Take 1 tablet by mouth  daily. 90 tablet 3  . clindamycin (CLEOCIN) 150 MG capsule TAKE 2 CAPSULES BY MOUTH PRIOR TO DENTAL WORK AS DIRECTED 2 capsule 2  . doxazosin (CARDURA) 1 MG tablet TAKE 1 TABLET BY MOUTH AT BEDTIME 90 tablet 3  . doxycycline (VIBRA-TABS) 100 MG tablet TAKE 1 TABLET BY MOUTH TWICE DAILY 180 tablet 0  . finasteride (PROSCAR) 5 MG tablet Take 1 tablet (5 mg total) by mouth daily. 90 tablet 3  . metoprolol succinate (TOPROL-XL) 25 MG 24 hr tablet TAKE 1 TABLET BY MOUTH EVERY MORNING AND 2 TABLETS BY MOUTH EVERY EVENING 270 tablet 3  . pantoprazole (PROTONIX) 40 MG tablet Take 1 tablet (40 mg total) by mouth daily. 90 tablet 3  . PROCTOZONE-HC 2.5 % rectal cream USE RECTALLY TWICE DAILY 30 g 0  . saw palmetto 160 MG capsule Take 1 capsule (160 mg total) by mouth 2 (two) times daily.    Marland Kitchen warfarin (COUMADIN) 5 MG tablet Take 1 tablet by mouth daily or as directed. 90 tablet 3    Home: Home Living Family/patient expects to be discharged to:: Private residence Living Arrangements: Spouse/significant other, Children (grown son) Available Help at Discharge: Family, Available PRN/intermittently (Wife unable to assist.  Son works) Type of Home: House Home Access: Stairs to enter Technical brewer of Steps: 3 Entrance Stairs-Rails: Right [place]: One level Bathroom Shower/Tub: Gaffer, Charity fundraiser: [place]: Environmental consultant - 2 wheels, Environmental consultant - 4 wheels, Radio producer - single point  Lives With: Spouse  Functional History: Prior Function Level of Independence: Independent with assistive device(s) Comments: Owns Optician, dispensing Status:  Mobility: Bed Mobility General bed mobility comments: Patient in chair Transfers Overall transfer level: Needs assistance Equipment used: Rolling walker (2 wheeled) Transfers: Sit to/from Stand Sit to Stand: [place] transfer comment: Verbal cues for hand placement.  Assist to rise to standing and for balance  initially. Ambulation/Gait Ambulation/Gait assistance: Mod assist, +2 safety/equipment Ambulation Distance (Feet): 36 Feet Assistive device: Rolling walker (2 wheeled) Gait Pattern/deviations: Step-to pattern, Decreased stance time - left, Decreased step length - left, Decreased step length - right, Decreased stride length, Decreased weight shift to left, Shuffle, Trunk flexed General Gait Details: Verbal cues for safe use of RW, keep feet inside RW, and stand upright during gait.  Patient with slow shuffle steps.  Decreased weight shift to LLE, and decreased step length with LLE - keeping LLE behind during gait.  Loss of balance x2 during gait requiring assist to maintain balance. Gait velocity: decreased Gait velocity interpretation: Below normal speed for age/gender    ADL: ADL Overall ADL's : Needs assistance/impaired Eating/Feeding: NPO Grooming: Moderate assistance, Sitting, Wash/dry face Upper Body Bathing: Minimal assitance, Sitting Lower Body Bathing: Moderate assistance (min A sit<>stand) Upper Body Dressing : Moderate assistance, Sitting Lower Body Dressing: Moderate assistance (min A sit<>stand) Toilet Transfer: Moderate assistance, Ambulation, RW (bed>out door and ambulated>sit in recliner behind him) Toileting- Water quality scientist and Hygiene: Moderate assistance (min A sit<>stand)  Cognition: Cognition Overall Cognitive Status: Within Functional Limits for tasks assessed Arousal/Alertness: Awake/alert Orientation Level: Oriented X4 Attention: Sustained Sustained Attention: Impaired Sustained Attention Impairment: Verbal complex, Functional complex Memory: Impaired Memory Impairment: Decreased recall of new information, Decreased short term memory Decreased  Short Term Memory: Verbal basic, Functional basic Awareness: Impaired Awareness Impairment: Anticipatory impairment Problem Solving: Impaired Problem Solving Impairment: Verbal basic, Functional  basic Executive Function: Organizing, Decision Making, Reasoning Reasoning: Impaired Reasoning Impairment: Verbal basic, Functional basic Organizing: Impaired Organizing Impairment: Verbal basic, Functional basic Decision Making: Impaired Decision Making Impairment: Verbal basic, Functional basic Behaviors: Impulsive, Perseveration Safety/Judgment: Impaired Comments: Pt continued to ask to go home during SLE, go to bathroom with no apparent concern or awareness of left weakness or catheter in place Cognition Arousal/Alertness: Awake/alert Behavior During Therapy: WFL for tasks assessed/performed, Impulsive Overall Cognitive Status: Within Functional Limits for tasks assessed  Blood pressure 129/70, pulse (!) 110, temperature 98.6 F (37 C), temperature source Axillary, resp. rate 20, height 5\' 7"  (1.702 m), weight 71.7 kg (158 lb 1.1 oz), SpO2 97 %. Physical Exam  Vitals reviewed. Constitutional: He is oriented to person, place, and time. He appears well-developed and well-nourished.  HENT:  Head: Normocephalic and atraumatic.  Eyes: Conjunctivae and EOM are normal.  Neck: Normal range of motion. Neck supple. No thyromegaly present.  Cardiovascular: Normal rate and regular rhythm.   Respiratory: Effort normal and breath sounds normal. No respiratory distress.  GI: Soft. Bowel sounds are normal. He exhibits no distension.  Musculoskeletal: He exhibits no edema or tenderness.  Neurological: He is alert and oriented to person, place, and time.  Speech is dysarthric but intelligible.  Patient follows simple commands.  Left facial weakness Sensation intact to light touch DTRs symmetric LUE: 4/5 proximal to distal RUE: 4+/5 proximal to distal RLE: 4+/5 proximal to distal LLE: 4+/5 proxima to distal (slightly weaker than RLE)  Skin: Skin is warm and dry.  Psychiatric: He has a normal mood and affect. His behavior is normal.    Results for orders placed or performed during the  hospital encounter of 05/04/16 (from the past 24 hour(s))  Protime-INR     Status: Abnormal   Collection Time: 05/07/16  7:53 AM  Result Value Ref Range   Prothrombin Time 16.5 (H) 11.4 - 15.2 seconds   INR 1.32   Phosphorus in AM     Status: Abnormal   Collection Time: 05/07/16  7:53 AM  Result Value Ref Range   Phosphorus 2.4 (L) 2.5 - 4.6 mg/dL  BMET in AM     Status: Abnormal   Collection Time: 05/07/16  7:53 AM  Result Value Ref Range   Sodium 144 135 - 145 mmol/L   Potassium 3.3 (L) 3.5 - 5.1 mmol/L   Chloride 109 101 - 111 mmol/L   CO2 28 22 - 32 mmol/L   Glucose, Bld 99 65 - 99 mg/dL   BUN 10 6 - 20 mg/dL   Creatinine, Ser 0.71 0.61 - 1.24 mg/dL   Calcium 8.3 (L) 8.9 - 10.3 mg/dL   GFR calc non Af Amer >60 >60 mL/min   GFR calc Af Amer >60 >60 mL/min   Anion gap 7 5 - 15  Magnesium in AM     Status: None   Collection Time: 05/07/16  7:53 AM  Result Value Ref Range   Magnesium 1.8 1.7 - 2.4 mg/dL  Protime-INR     Status: Abnormal   Collection Time: 05/08/16  3:08 AM  Result Value Ref Range   Prothrombin Time 16.0 (H) 11.4 - 15.2 seconds   INR 1.28    No results found.  Assessment/Plan: Diagnosis: Right MCA infarct Labs and images independently reviewed.  Records reviewed and summated above. Stroke: Continue secondary stroke  prophylaxis and Risk Factor Modification listed below:   Antiplatelet therapy:   Blood Pressure Management:  Continue current medication with prn's with permisive HTN per primary team Statin Agent:   Left sided hemiparesis  1. Does the need for close, 24 hr/day medical supervision in concert with the patient's rehab needs make it unreasonable for this patient to be served in a less intensive setting? Yes 2. Co-Morbidities requiring supervision/potential complications: HTN (monitor and provide prns in accordance with increased physical exertion and pain), TIA, CAD with mitral valve repair (cont meds), tachypnea (monitor RR and O2 Sats with  increased physical exertion), A. Fib/flutter (cont meds, monitor HR with increased activity), hypokalemia (continue to monitor and replete as necessary), ABLA (transfuse if necessary to ensure appropriate perfusion for increased activity tolerance), Thrombocytopenia (< 60,000/mm3 no resistive exercise), dysphagia (cont SLP, advance diet as tolerated) 3. Due to safety, skin/wound care, disease management, medication administration and patient education, does the patient require 24 hr/day rehab nursing? Yes 4. Does the patient require coordinated care of a physician, rehab nurse, PT (1-2 hrs/day, 5 days/week), OT (1-2 hrs/day, 5 days/week) and SLP (1-2 hrs/day, 5 days/week) to address physical and functional deficits in the context of the above medical diagnosis(es)? Yes Addressing deficits in the following areas: balance, endurance, locomotion, strength, transferring, bathing, dressing, feeding, toileting, cognition, speech, swallowing and psychosocial support 5. Can the patient actively participate in an intensive therapy program of at least 3 hrs of therapy per day at least 5 days per week? Yes 6. The potential for patient to make measurable gains while on inpatient rehab is excellent 7. Anticipated functional outcomes upon discharge from inpatient rehab are supervision and min assist  with PT, supervision and min assist with OT, independent and modified independent with SLP. 8. Estimated rehab length of stay to reach the above functional goals is: 19-23 days. 9. Does the patient have adequate social supports and living environment to accommodate these discharge functional goals? Yes 10. Anticipated D/C setting: Home 11. Anticipated post D/C treatments: HH therapy and Home excercise program 12. Overall Rehab/Functional Prognosis: good  RECOMMENDATIONS: This patient's condition is appropriate for continued rehabilitative care in the following setting: CIR Patient has agreed to participate in  recommended program. Yes Note that insurance prior authorization may be required for reimbursement for recommended care.  Comment: Rehab Admissions Coordinator to follow up.  Delice Lesch, MD, Mellody Drown 05/08/2016

## 2016-05-08 NOTE — Progress Notes (Signed)
Physical Therapy Treatment Patient Details Name: Brian [name] MRN: [id number] DOB: 09-16-1932 Today's Date: 05/08/2016    History of Present Illness Patient is an 82 yo male admitted 05/04/16 as code stroke after he was found down in the kitchen with Lt-sided hemiparesis.  Patient s/p revascularization Rt MCA.   PMH:  TIA, HTN, Aflutter, HLD    PT Comments    Pt is able to walk better today with RW and his lace up shoes.  He has decreased awareness of his left sided deficits and decreased safety awareness.  He is very motivated to get better so he can get home and hopefully back to work (5 days a week).  He continues to be an excellent CIR level candidate and would benefit from all 3 disciplines.  He has a very supportive family.    Follow Up Recommendations  CIR     Equipment Recommendations  None recommended by PT    Recommendations for Other Services   NA     Precautions / Restrictions Precautions Precautions: Fall Precaution Comments: decreased awareness of deficits and safety Restrictions Weight Bearing Restrictions: No    Mobility  Bed Mobility               General bed mobility comments: Pt OOB in recliner chair  Transfers Overall transfer level: Needs assistance Equipment used: Rolling walker (2 wheeled) Transfers: Sit to/from Stand Sit to Stand: Min assist         General transfer comment: Min assist to support trunk during transitions.  Verbal cues for safe hand placement and to get cloer to the edge of the chair in preparation to stand.   Ambulation/Gait Ambulation/Gait assistance: Min assist Ambulation Distance (Feet): 130 Feet Assistive device: Rolling walker (2 wheeled) Gait Pattern/deviations: Step-through pattern;Decreased dorsiflexion - left;Shuffle;Trunk flexed Gait velocity: decreased Gait velocity interpretation: Below normal speed for age/gender General Gait Details: Pt with very flexed trunk (he reports spine arthritis, so this may be  baseline), as he fatigues his left foot clearance decreases, yet he never buckles over the left knee.  Donned his lace up shoes for gait today.        Modified Rankin (Stroke Patients Only) Modified Rankin (Stroke Patients Only) Pre-Morbid Rankin Score: Slight disability Modified Rankin: Moderately severe disability     Balance Overall balance assessment: Needs assistance Sitting-balance support: Feet supported;No upper extremity supported Sitting balance-Leahy Scale: Fair     Standing balance support: Bilateral upper extremity supported Standing balance-Leahy Scale: Poor                      Cognition Arousal/Alertness: Awake/alert Behavior During Therapy: WFL for tasks assessed/performed;Impulsive Overall Cognitive Status: Impaired/Different from baseline Area of Impairment: Awareness;Safety/judgement         Safety/Judgement: Decreased awareness of safety;Decreased awareness of deficits Awareness: Intellectual   General Comments: Pt is slow to acknowlege the left side, reports he feels normal re: strength (left side mildly weaker than right).      Exercises General Exercises - Lower Extremity Long Arc Quad: AROM;Both;20 reps Hip ABduction/ADduction: AROM;Both;20 reps Hip Flexion/Marching: AROM;Both;20 reps Toe Raises: AROM;Both;20 reps Heel Raises: AROM;Both;20 reps Mini-Sqauts: AROM;Both;10 reps        Pertinent Vitals/Pain Pain Assessment: No/denies pain           PT Goals (current goals can now be found in the care plan section) Acute Rehab PT Goals Patient Stated Goal: To go home and back to work Progress towards PT goals: Progressing  toward goals    Frequency    Min 4X/week      PT Plan Frequency needs to be updated       End of Session Equipment Utilized During Treatment: Gait belt Activity Tolerance: Patient tolerated treatment well Patient left: in chair;with call bell/phone within reach;with family/visitor present      Time: 1203-1246 PT Time Calculation (min) (ACUTE ONLY): 43 min  Charges:  $Gait Training: 23-37 mins $Therapeutic Exercise: 8-22 mins                      [name] B. [place], [place], DPT [phone]   05/08/2016, 2:42 PM

## 2016-05-08 NOTE — Progress Notes (Signed)
STROKE TEAM PROGRESS NOTE   HISTORY OF PRESENT ILLNESS (per record) Brian [name] is an 82 y.o. male with past medical history of TIA, hypertension, atrial flutter. Patient was brought to the emergency department as code stroke after he was found down in the kitchen with left-sided hemiparesis. Patient's last known normal was 6:15 PM the prior night. Per son as 6:15 this morning patient called out to him and he was found on the floor not moving his left side. Patient was brought in for immediate CT scan which showed a right hyperdense MCA. At that time patient was considered to be a possible wake up stroke. Patient was brought for immediate CT angiogram and perfusion. CT perfusion showed significant amount of viable tissue. For this reason he was brought to intervention radiology.  Date last known well: Date: 05/03/2016 Time last known well: Time: 18:15 tPA Given: No: out of window   Cerebral Angiogram Rt common carotid arteriogram,followed by complete angiographic revascularization of occluded RT MCA M1 seg using x1 pass with solitaire 81mmx 40 mm retrieval device and 3.6 mg of superselective IA integrelin achieving a TICI 2b/TICI 3 reperfusion   SUBJECTIVE (INTERVAL HISTORY) His family is not at the bedside.   Overall he feels his condition is stable He did not get a floor bed for transfery.   OBJECTIVE Temp:  [98 F (36.7 C)-98.7 F (37.1 C)] 98 F (36.7 C) (09/18 1200) Pulse Rate:  [71-110] 85 (09/18 1200) Cardiac Rhythm: Atrial fibrillation (09/18 1200) Resp:  [14-20] 16 (09/18 1200) BP: (100-133)/(64-84) 100/84 (09/18 1100) SpO2:  [96 %-100 %] 100 % (09/18 1200)  CBC:   Recent Labs Lab 05/04/16 0920  05/05/16 0500 05/06/16 0606  WBC 9.3  --  6.8 6.9  NEUTROABS 7.9*  --  5.0  --   HGB 13.7  < > 11.3* 10.8*  HCT 41.4  < > 34.4* 34.0*  MCV 90.2  --  90.1 91.6  PLT 141*  --  128* 124*  < > = values in this interval not displayed.  Basic Metabolic Panel:   Recent  Labs Lab 05/06/16 0606 05/07/16 0753  NA 142 144  K 3.3* 3.3*  CL 112* 109  CO2 24 28  GLUCOSE 111* 99  BUN 10 10  CREATININE 0.54* 0.71  CALCIUM 8.2* 8.3*  MG 1.6* 1.8  PHOS 2.0* 2.4*    Lipid Panel:     Component Value Date/Time   CHOL 114 05/05/2016 0500   TRIG 54 05/05/2016 0500   HDL 41 05/05/2016 0500   CHOLHDL 2.8 05/05/2016 0500   VLDL 11 05/05/2016 0500   LDLCALC 62 05/05/2016 0500   HgbA1c:  Lab Results  Component Value Date   HGBA1C 5.3 05/05/2016   Urine Drug Screen: No results found for: LABOPIA, COCAINSCRNUR, LABBENZ, AMPHETMU, THCU, LABBARB    IMAGING I have personally reviewed the radiological images below and agree with the radiology interpretations.  Ct Angio Head and Neck and CT perfusion 05/04/2016 Thrombus with occlusion of the right carotid terminus with poor flow to the right middle cerebral artery branches (collateral flow to distal right middle cerebral artery branches). This is causing decreased caliber of the right internal carotid artery petrous and cavernous segment without high-grade proximal stenosis.  Large area of ischemia involving the right middle cerebral artery distribution.   Ct Head Wo Contrast 05/04/2016 HEAD CT:  1. Hyperdense right middle cerebral artery sign accompanied by subtle decreased attenuation along the right insular cortex. This suggests a recent  right middle cerebral artery infarct due to an M1 segment embolus/thrombus. Recommend follow-up brain MRI.  2. No other acute intracranial abnormalities. No intracranial hemorrhage.   Mr Jodene Nam Head/brain Wo Cm 05/05/2016 1. Acute infarct of the right caudate head. No midline shift or significant mass effect.  2. Focal susceptibility within the right basal ganglia in the location of intraparenchymal hyperattenuation seen on the prior head CT. This is likely due to the presence of deoxyhemoglobin, favoring petechial hemorrhage over contrast staining.  3. Normal MRA of the circle  of Willis.   Dg Chest Port 1 View 05/05/2016 Minimal bibasilar atelectasis. Enlargement cardiac silhouette post MVR. Electronically Signed     Ct Head Code Stroke Wo Contrast` 05/04/2016 1. Mild blush of hyperdensity involving the right basal ganglia, most consistent with contrast staining status post recent catheter directed arteriogram.  No definite acute intracranial hemorrhage identified, although close interval follow-up is warranted. 2. No other significant evolving right MCA territory infarct identified at this time. 3. No other new acute intracranial process.   2-D Echo Left ventricle: The cavity size was normal. Wall thickness was   normal. Systolic function was normal. The estimated ejection   fraction was in the range of 55% to 60%. Wall motion was normal;   there were no regional wall motion abnormalities   PHYSICAL EXAM  Temp:  [98 F (36.7 C)-98.7 F (37.1 C)] 98 F (36.7 C) (09/18 1200) Pulse Rate:  [71-110] 85 (09/18 1200) Resp:  [14-20] 16 (09/18 1200) BP: (100-133)/(64-84) 100/84 (09/18 1100) SpO2:  [96 %-100 %] 100 % (09/18 1200)  General - Well nourished, well developed, sitting up in bedside chair  Ophthalmologic - Fundi not visualized.  Cardiovascular - irregularly irregular heart rate and rhythm.  Neuro - awake, alert,   eyes open on voice, follows all central and peripheral commands. PERRL, blinking to visual threat bilaterally, positive corneal and gag reflexes. RUE and RLE 5/5, LLE 5/5 and LUE 4/5 with 3/5 finger grasp and difficulty with dexterity. Sensation symmetrical bilaterally, FTN intact bilaterally although slow on the left side. DTR 1+ and no Babinski. Gait not tested    ASSESSMENT/PLAN [name] is a 82 y.o. male with history of previous TIA, history of venous thrombosis and embolism, hypertension, previous GI bleed, epistaxis, anemia and atrial flutter with warfarin therapy ( INR 2.8 at time of admission) presenting with left  hemiparesis. He did not receive IV t-PA due to late presentation and warfarin.  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 Fib s/p mech embolectomy and recanalization  Resultant  intubated, right upper extremity mild paresis  MRI - Acute infarct of the right BG with petechiae hemorrhagic transformation.  MRA - Normal MRA of the circle of Willis.   CTA head and neck - right ICA terminus occlusion  Cerebral angiogram - complete revascularization of occluded right MCA, TICI 2b/TICI 3 reperfusion 2D Echo - Left ventricle: The cavity size was normal. Wall thickness was   normal. Systolic function was normal. The estimated ejection   fraction was in the range of 55% to 60%. Wall motion was normal;    there were no regional wall motion abnormalities  LDL - 62  HgbA1c 5.3  VTE prophylaxis - SCDs and INR 2.88 DIET DYS 2 Room service appropriate? Yes; Fluid consistency: Nectar Thick  warfarin daily prior to admission, currently Coumadin on hold but INR 2.88. Due to hemorrhagic transformation with high INR, will  use Kcentra to reverse INR to avoid further hemorrhagic transformation.  Continue aspirin for stroke prevention and subcutaneous heparin for DVT prophylaxis.   Patient counseled to be compliant with his antithrombotic medications  Ongoing aggressive stroke risk factor management  Therapy recommendations: CLR Disposition: CLR Persistent A. fib on Coumadin  INR on admission 2.8  Coumadin on hold, however, INR in a.m. 2.88  Reversal with Kcentra to avoid further hemorrhagic transformation  May consider DOACs after 1 week post stroke  History of GI bleeding  Has been years ago  No recent GI bleeding on Coumadin  Hb 13.7 -> 11.3  Close monitoring   CBC in a.m.  Hypertension  Blood pressure running somewhat low  Permissive hypertension (OK if < 180/105 due to high INR) but gradually normalize in 5-7  days  Long-term BP goal normotensive  Hyperlipidemia  Home meds:  No lipid lowering medications prior to admission  LDL 62, goal < 70  Other Stroke Risk Factors  Advanced age  Cigarette smoker - already quit  Hx of TIA  Other Active Problems  Mild thrombocytopenia, platelet 128  Hypokalemia - 3.2, supplement  Hospital day # 4 Plan Transfer to neurology floor bed. Speech therapy consult for swallowing . Mobilize out of bed as tolerated. Therapy and rehab consults. . Greater than 50% of time during this 25 minute visit was spent on counseling and coordination of care about this stroke, hemorrhage and atrial fibrillation  Antony Contras, MD Stroke Neurology 05/08/2016  2:22 PM    To contact Stroke Continuity provider, please refer to [email or web address] After hours, contact General Neurology

## 2016-05-08 NOTE — Progress Notes (Signed)
MBSS complete. Full report located under chart review in imaging section.   Recommend Dys 2, nectar. Please see imaging for swallowing and full MBS results.    Orbie Pyo [place].[employer] [phone]

## 2016-05-08 NOTE — Care Management Important Message (Signed)
Important Message  Patient Details  Name: MADS LORENC MRN: [id number] Date of Birth: 1933/04/22   Medicare Important Message Given:  Other (see comment)    [place], Deyonna Fitzsimmons [place] 05/08/2016, 9:49 AM

## 2016-05-08 NOTE — Care Management Note (Signed)
Case Management Note  Patient Details  Name: EIRIK TINKER MRN: [id number] Date of Birth: Feb 18, 1933  Subjective/Objective:   Pt admitted on 05/04/16 with Rt MCA stroke.   PTA, pt independent, lives with spouse.                    Action/Plan: PT/OT recommending CIR and consult in progress.  Pt has orders to transfer to regular floor.  Await CIR input regarding admission.    Expected Discharge Date:                  Expected Discharge Plan:  [place]  In-House Referral:     Discharge planning Services  CM Consult  Post Acute Care Choice:    Choice offered to:     DME Arranged:    DME Agency:     HH Arranged:    [place] Agency:     Status of Service:  In process, will continue to follow  If discussed at Long Length of Stay Meetings, dates discussed:    Additional Comments:  Reinaldo Raddle, RN, BSN  Trauma/Neuro ICU Case Manager [phone]

## 2016-05-08 NOTE — Progress Notes (Signed)
Referring Physician(s): Dr Royal Hawthorn  Supervising Physician: [name]  Patient Status:  Inpatient  Chief Complaint:  CVA R MCA revasc 9/14  Subjective:  Up in chair Better daily Rt groin hematoma- resolving  Allergies: Novocain [procaine hcl] and Penicillins  Medications: Prior to Admission medications   Medication Sig Start Date End Date Taking? Authorizing Provider  ALPRAZolam (XANAX) 0.25 MG tablet Take 1 tablet (0.25 mg total) by mouth at bedtime. 03/22/16  Yes Marletta Lor, MD  benazepril-hydrochlorthiazide (LOTENSIN HCT) 5-6.25 MG tablet Take 1 tablet by mouth daily. 03/22/16  Yes Marletta Lor, MD  clindamycin (CLEOCIN) 150 MG capsule TAKE 2 CAPSULES BY MOUTH PRIOR TO DENTAL WORK AS DIRECTED 11/10/15  Yes Marletta Lor, MD  doxazosin (CARDURA) 1 MG tablet TAKE 1 TABLET BY MOUTH AT BEDTIME 03/22/16  Yes Marletta Lor, MD  doxycycline (VIBRA-TABS) 100 MG tablet TAKE 1 TABLET BY MOUTH TWICE DAILY 03/08/16  Yes Marletta Lor, MD  finasteride (PROSCAR) 5 MG tablet Take 1 tablet (5 mg total) by mouth daily. 03/22/16  Yes Marletta Lor, MD  metoprolol succinate (TOPROL-XL) 25 MG 24 hr tablet TAKE 1 TABLET BY MOUTH EVERY MORNING AND 2 TABLETS BY MOUTH EVERY EVENING 03/22/16  Yes Marletta Lor, MD  pantoprazole (PROTONIX) 40 MG tablet Take 1 tablet (40 mg total) by mouth daily. 03/22/16  Yes Marletta Lor, MD  PROCTOZONE-HC 2.5 % rectal cream USE RECTALLY TWICE DAILY 08/04/15  Yes Marletta Lor, MD  saw palmetto 160 MG capsule Take 1 capsule (160 mg total) by mouth 2 (two) times daily. 01/26/14  Yes Ricard Dillon, MD  warfarin (COUMADIN) 5 MG tablet Take 1 tablet by mouth daily or as directed. 03/22/16  Yes Marletta Lor, MD     Vital Signs: BP 133/64 (BP Location: Left Leg)   Pulse 71   Temp 98 F (36.7 C) (Axillary)   Resp 15   Ht 5\' 7"  (1.702 m)   Wt 158 lb 1.1 oz (71.7 kg)   SpO2 98%   BMI 24.76 kg/m    Physical Exam  Neurological: He is alert.  Skin: Skin is warm and dry.  Rt groin  Very small hematoma; soft at Rt groin approx 2x2 cm Ecchymosis from groin to low thigh NT to touch Rt foot 2+ pulses   Nursing note and vitals reviewed.   Imaging: Ct Head Wo Contrast  Result Date: 05/06/2016 CLINICAL DATA:  82 y/o  M; follow-up of hemorrhage. EXAM: CT HEAD WITHOUT CONTRAST TECHNIQUE: Contiguous axial images were obtained from the base of the skull through the vertex without intravenous contrast. COMPARISON:  05/05/2016 MR brain.  05/04/2016 CT head. FINDINGS: Brain: Stable focus of hemorrhage in the right basal ganglia with mild surrounding vasogenic edema and local mass effect centered in a small area of infarction better characterized on the prior MRI head. No evidence for new large territory infarct or intracranial hemorrhage. Background mild chronic microvascular ischemic changes and parenchymal volume loss. Vascular: No hyperdense vessel or unexpected calcification. Skull: Normal. Negative for fracture or focal lesion. Sinuses/Orbits: Left maxillary sinus mucous retention cyst. Fluid levels in the sphenoid sinuses is probably due to intubation. Normally aerated mastoid air cells. Bilateral intra-ocular lens replacement. Other: None. IMPRESSION: Small hemorrhage within the right basal ganglia region of infarction is stable in comparison with the prior MRI given differences in technique. No new hemorrhage or large territory infarct is identified. Electronically Signed   By: [name]  [place] M.D.   On: 05/06/2016 05:18   Mr Brain Wo Contrast  Result Date: 05/05/2016 CLINICAL DATA:  Left-sided hemi paresis. EXAM: MRI HEAD WITHOUT CONTRAST MRA HEAD WITHOUT CONTRAST TECHNIQUE: Multiplanar, multiecho pulse sequences of the brain and surrounding structures were obtained without intravenous contrast. Angiographic images of the head were obtained using MRA technique without contrast.  COMPARISON:  Head CT 05/04/2016 FINDINGS: MRI HEAD FINDINGS Brain: There is focal diffusion restriction within the right caudate head. Gradient echo imaging shows focal susceptibility within the right basal ganglia in the distribution of the hyperdensity demonstrated on the recent head CT. The midline structures are normal. There is multifocal hyperintense T2 weighted signal within the subcortical white matter. There is mild mass effect on the frontal horn of the right lateral ventricle. No midline shift or evidence of herniation. No hydrocephalus. Vascular: As above, focal susceptibility within the right basal ganglia. No evidence of chronic microhemorrhage or amyloid angiopathy. Skull and upper cervical spine: The visualized skull base, calvarium, upper cervical spine and extracranial soft tissues are normal. Sinuses/Orbits: Fluid within the sphenoid sinuses. Maxillary mucosal thickening. Bilateral lens replacements. MRA HEAD FINDINGS Intracranial internal carotid arteries: Normal. Anterior cerebral arteries: Normal. Middle cerebral arteries: Normal. Posterior communicating arteries: Not visualized. Posterior cerebral arteries: Normal. Basilar artery: Normal. Vertebral arteries: Codominant. Normal. Superior cerebellar arteries: Normal. Anterior inferior cerebellar arteries: Normal on the right. Not visualized on the left. Posterior inferior cerebellar arteries: Normal. IMPRESSION: 1. Acute infarct of the right caudate head. No midline shift or significant mass effect. 2. Focal susceptibility within the right basal ganglia in the location of intraparenchymal hyperattenuation seen on the prior head CT. This is likely due to the presence of deoxyhemoglobin, favoring petechial hemorrhage over contrast staining. Short interval follow-up head CT is recommended, as contrast extravasation may be expected to clear 24 hours following angiography. 3. Normal MRA of the circle of Willis. Electronically Signed   By: Ulyses Jarred M.D.   On: 05/05/2016 04:33   Dg Chest Port 1 View  Result Date: 05/06/2016 CLINICAL DATA:  Respiratory failure EXAM: PORTABLE CHEST 1 VIEW COMPARISON:  05/05/2016 FINDINGS: Endotracheal tube unchanged. Introduction of NG tube which extends in the course of the stomach below the level the found. LEFT basilar atelectasis and effusion. No pneumothorax. IMPRESSION: 1. Endotracheal tube unchanged.  Introduction of NG tube. 2. LEFT lower lobe effusion and atelectasis. Electronically Signed   By: Suzy Bouchard M.D.   On: 05/06/2016 08:17   Dg Chest Port 1 View  Result Date: 05/05/2016 CLINICAL DATA:  Intubation, history hypertension, atrial flutter, stroke EXAM: PORTABLE CHEST 1 VIEW COMPARISON:  Portable exam 0619 hours compared to 05/04/2016 FINDINGS: Tip of endotracheal tube projects 6.8 cm above carina. Enlargement of cardiac silhouette post MVR. Mediastinal contours and pulmonary vascularity normal. Minimal bibasilar atelectasis. LEFT costophrenic angle excluded. No gross infiltrate, pleural effusion or pneumothorax. Minimal central peribronchial thickening. BILATERAL glenohumeral degenerative changes. IMPRESSION: Minimal bibasilar atelectasis. Enlargement cardiac silhouette post MVR. Electronically Signed   By: Lavonia Dana M.D.   On: 05/05/2016 09:03   Dg Chest Port 1 View  Result Date: 05/04/2016 CLINICAL DATA:  Endotracheal tube placement. EXAM: PORTABLE CHEST 1 VIEW COMPARISON:  None. FINDINGS: Endotracheal tube tip projects 5 cm above the carina. There changes from cardiac surgery and valve replacement. Cardiac silhouette is mildly enlarged. Aorta is uncoiled and prominent. No mediastinal or hilar masses or convincing adenopathy. Lungs are clear.  No pleural effusion or pneumothorax. Skeletal structures are demineralized. There are  arthropathic changes of the right glenohumeral joint and significant narrowing of the subacromial space consistent with chronic full-thickness rotator cuff tear.  IMPRESSION: 1. No acute cardiopulmonary disease. 2. Endotracheal tube tip projects 5 cm above the carina. Electronically Signed   By: Lajean Manes M.D.   On: 05/04/2016 16:52   Dg Abd Portable 1v  Result Date: 05/05/2016 CLINICAL DATA:  OG tube placement. EXAM: PORTABLE ABDOMEN - 1 VIEW COMPARISON:  Chest x-ray today. FINDINGS: High KUB demonstrates interval placement of a nasogastric tube which is coiled months with tip and side-port over the stomach in the left upper quadrant. Visualized portions of the chest are unchanged. Upper abdomen demonstrates nonobstructive bowel gas pattern. IMPRESSION: Interval placement of nasogastric tube with tip and side-port over the stomach in the left upper quadrant. Electronically Signed   By: Marin Olp M.D.   On: 05/05/2016 20:22   Mr Jodene Nam Head/brain [id number] Cm  Result Date: 05/05/2016 CLINICAL DATA:  Left-sided hemi paresis. EXAM: MRI HEAD WITHOUT CONTRAST MRA HEAD WITHOUT CONTRAST TECHNIQUE: Multiplanar, multiecho pulse sequences of the brain and surrounding structures were obtained without intravenous contrast. Angiographic images of the head were obtained using MRA technique without contrast. COMPARISON:  Head CT 05/04/2016 FINDINGS: MRI HEAD FINDINGS Brain: There is focal diffusion restriction within the right caudate head. Gradient echo imaging shows focal susceptibility within the right basal ganglia in the distribution of the hyperdensity demonstrated on the recent head CT. The midline structures are normal. There is multifocal hyperintense T2 weighted signal within the subcortical white matter. There is mild mass effect on the frontal horn of the right lateral ventricle. No midline shift or evidence of herniation. No hydrocephalus. Vascular: As above, focal susceptibility within the right basal ganglia. No evidence of chronic microhemorrhage or amyloid angiopathy. Skull and upper cervical spine: The visualized skull base, calvarium, upper cervical spine and  extracranial soft tissues are normal. Sinuses/Orbits: Fluid within the sphenoid sinuses. Maxillary mucosal thickening. Bilateral lens replacements. MRA HEAD FINDINGS Intracranial internal carotid arteries: Normal. Anterior cerebral arteries: Normal. Middle cerebral arteries: Normal. Posterior communicating arteries: Not visualized. Posterior cerebral arteries: Normal. Basilar artery: Normal. Vertebral arteries: Codominant. Normal. Superior cerebellar arteries: Normal. Anterior inferior cerebellar arteries: Normal on the right. Not visualized on the left. Posterior inferior cerebellar arteries: Normal. IMPRESSION: 1. Acute infarct of the right caudate head. No midline shift or significant mass effect. 2. Focal susceptibility within the right basal ganglia in the location of intraparenchymal hyperattenuation seen on the prior head CT. This is likely due to the presence of deoxyhemoglobin, favoring petechial hemorrhage over contrast staining. Short interval follow-up head CT is recommended, as contrast extravasation may be expected to clear 24 hours following angiography. 3. Normal MRA of the circle of Willis. Electronically Signed   By: Ulyses Jarred M.D.   On: 05/05/2016 04:33   Ct Head Code Stroke Wo Contrast`  Result Date: 05/04/2016 CLINICAL DATA:  Code stroke. Follow-up exam following right common carotid arteriogram with revascularization of occluded right M1 segment. EXAM: CT HEAD WITHOUT CONTRAST TECHNIQUE: Contiguous axial images were obtained from the base of the skull through the vertex without intravenous contrast. COMPARISON:  Prior studies from earlier the same day. FINDINGS: Blush of hyperdensity involves the right basal ganglia act, specifically the right caudate head and lentiform nucleus. Finding most consistent with contrast staining from recent catheter directed arteriogram rather than hemorrhage. IV contrast within the vascular structures of the brain noted. No definite acute intracranial  hemorrhage. Gray-white matter differentiation otherwise maintained  at this time without definite evidence for evolving ischemia. Stable atrophy with chronic microvascular ischemic disease. No mass lesion or midline shift. No mass effect. No hydrocephalus. No extra-axial fluid collection. Scalp soft tissues within normal limits. Globes and orbits demonstrate no acute abnormality. Patient status post lens extraction bilaterally. Scattered mucosal thickening throughout the maxillary sinuses and ethmoidal air cells as well as the sphenoid sinuses. Small fluid levels within the sphenoid sinuses present. No mastoid effusion. Calvarium intact. IMPRESSION: 1. Mild blush of hyperdensity involving the right basal ganglia, most consistent with contrast staining status post recent catheter directed arteriogram. No definite acute intracranial hemorrhage identified, although close interval follow-up is warranted. 2. No other significant evolving right MCA territory infarct identified at this time. 3. No other new acute intracranial process. Electronically Signed   By: Jeannine Boga M.D.   On: 05/04/2016 14:35    Labs:  CBC:  Recent Labs  09/22/15 0954 05/04/16 0920 05/04/16 0934 05/05/16 0500 05/06/16 0606  WBC 6.1 9.3  --  6.8 6.9  HGB 14.9 13.7 14.6 11.3* 10.8*  HCT 45.1 41.4 43.0 34.4* 34.0*  PLT 219.0 141*  --  128* 124*    COAGS:  Recent Labs  05/04/16 0920  05/06/16 0606 05/06/16 1304 05/07/16 0753 05/08/16 0308  INR 2.45  < > 1.30 1.23 1.32 1.28  APTT 35  --   --   --   --   --   < > = values in this interval not displayed.  BMP:  Recent Labs  05/04/16 0920 05/04/16 0934 05/05/16 0500 05/06/16 0606 05/07/16 0753  NA 140 140 141 142 144  K 3.7 3.6 3.2* 3.3* 3.3*  CL 106 101 113* 112* 109  CO2 26  --  24 24 28   GLUCOSE 113* 108* 88 111* 99  BUN 17 20 9 10 10   CALCIUM 9.0  --  8.2* 8.2* 8.3*  CREATININE 0.78 0.80 0.66 0.54* 0.71  GFRNONAA >60  --  >60 >60 >60  GFRAA  >60  --  >60 >60 >60    LIVER FUNCTION TESTS:  Recent Labs  09/22/15 0954 05/04/16 0920  BILITOT 0.7 1.0  AST 27 35  ALT 23 29  ALKPHOS 109 97  PROT 6.6 6.5  ALBUMIN 4.1 3.8    Assessment and Plan:  CVA R MCA revascularization 9/14 in IR Rt groin hematoma resolving Plan per Dr Leonie Man  Electronically Signed: Monia Sabal A 05/08/2016, 11:15 AM   I spent a total of 15 Minutes at the the patient's bedside AND on the patient's hospital floor or unit, greater than 50% of which was counseling/coordinating care for CVA/R MCA revasc

## 2016-05-09 ENCOUNTER — Encounter (HOSPITAL_COMMUNITY): Payer: Self-pay | Admitting: Interventional Radiology

## 2016-05-09 NOTE — Progress Notes (Signed)
Physical Therapy Treatment Patient Details Name: Brian [name] MRN: [id number] DOB: [birth year] Today's Date: 05/09/2016    History of Present Illness Patient is an 82 yo male admitted 05/04/16 as code stroke after he was found down in the kitchen with Lt-sided hemiparesis.  Patient s/p revascularization Rt MCA.   PMH:  TIA, HTN, Aflutter, HLD    PT Comments    Progressing well.  Emphasized strengthening and gait training.  Pt is very eager to walk and exercise, but works well past fatigue and not sure is he is aware of the safety consequences.   Follow Up Recommendations  Outpatient PT;Other (comment) (if family can provide adequate assist/supervision.)     Equipment Recommendations  None recommended by PT    Recommendations for Other Services       Precautions / Restrictions Precautions Precautions: Fall Precaution Comments: decreased awareness of deficits and safety    Mobility  Bed Mobility                  Transfers Overall transfer level: Needs assistance Equipment used: Rolling walker (2 wheeled) Transfers: Sit to/from Stand Sit to Stand: Min guard         General transfer comment: guard during transition to sit, pt cued to scoot forward to edge prior to standing  Ambulation/Gait Ambulation/Gait assistance: Supervision Ambulation Distance (Feet): 1000 Feet Assistive device: Rolling walker (2 wheeled) Gait Pattern/deviations: Step-through pattern Gait velocity: decreased Gait velocity interpretation: Below normal speed for age/gender General Gait Details: flexed trunk, hip drop L and leg length diff contributed to decreased L LE clearance during swing that became more pronouced with fatigue.  But pt wanted to continue way past fatigue.   Stairs Stairs: Yes Stairs assistance: Supervision Stair Management: Two rails;Alternating pattern;Step to pattern;Forwards Number of Stairs: 30 General stair comments: Used 1 step multiple times up and down with  each leg for strengthening as well as flight to negotiate steps.  Wheelchair Mobility    Modified Rankin (Stroke Patients Only) Modified Rankin (Stroke Patients Only) Pre-Morbid Rankin Score: Slight disability Modified Rankin: Moderate disability     Balance Overall balance assessment: Needs assistance Sitting-balance support: No upper extremity supported Sitting balance-Leahy Scale: Fair     Standing balance support: Bilateral upper extremity supported Standing balance-Leahy Scale: Poor Standing balance comment: reliant on AD.                    Cognition Arousal/Alertness: Awake/alert Behavior During Therapy: WFL for tasks assessed/performed;Impulsive Overall Cognitive Status: Within Functional Limits for tasks assessed           Safety/Judgement: Decreased awareness of safety          Exercises      General Comments        Pertinent Vitals/Pain Pain Assessment: No/denies pain    Home Living                      Prior Function            PT Goals (current goals can now be found in the care plan section) Acute Rehab PT Goals Patient Stated Goal: To go home and back to work PT Goal Formulation: With patient Time For Goal Achievement: 05/21/16 Potential to Achieve Goals: Good Progress towards PT goals: Progressing toward goals    Frequency    Min 3X/week      PT Plan Current plan remains appropriate    Co-evaluation  End of Session   Activity Tolerance: Patient tolerated treatment well Patient left: Other (comment) (left walking with his daughter)     Time: 1220-1255 PT Time Calculation (min) (ACUTE ONLY): 35 min  Charges:  $Gait Training: 23-37 mins                    G Codes:      Constantina Laseter, Tessie Fass 05/09/2016, 2:01 PM

## 2016-05-09 NOTE — Progress Notes (Signed)
Called and gave report to [place], RN on 33M. Patient's wife notified. This RN transported patient to 33M16, RN at bedside.

## 2016-05-09 NOTE — Clinical Social Work Note (Signed)
Per CIR admissions coordinator's note from yesterday 9/18, patient wants to return home once discharged. CSW will continue to follow for discharge needs.  Brian [name], [name]

## 2016-05-09 NOTE — Progress Notes (Signed)
Nutrition Follow-up  INTERVENTION:  Monitor PO intake for adequacy Magic Cup ice cream supplement while on dysphagia diet   NUTRITION DIAGNOSIS:   Inadequate oral intake related to inability to eat as evidenced by NPO status.  Discontinued; diet advanced  GOAL:   Patient will meet greater than or equal to 90% of their needs  Unmet-progressing  MONITOR:   PO intake, Labs, Skin, I & O's  REASON FOR ASSESSMENT:   Ventilator    ASSESSMENT:   82 y.o. male admitted 9/14 with right MCA ischemia for which he was taken to IR where he had clot retrieval.  He returned to the ICU and remained ventilated.  Pt was extubated on 9/16 and diet was advanced to Dysphagia 2 yesterday afternoon. Pt states that his appetite is good and that is "eating too much". Per nursing notes, pt is eating 40-50% of meals. RD encouraged adequate healthful PO intake.  Pt denies any nutrition needs at this time.  New weight is likely inaccurate.   Labs: Elevated PT, low hemoglobin, low magnesium and phosphorus  Diet Order:  DIET DYS 2 Room service appropriate? Yes; Fluid consistency: Nectar Thick  Skin:  Reviewed, no issues  Last BM:  9/18  Height:   Ht Readings from Last 1 Encounters:  05/08/16 5\' 7"  (1.702 m)    Weight:   Wt Readings from Last 1 Encounters:  05/08/16 116 lb 14.4 oz (53 kg)  ? Accuracy of weight; pt was previously at 158 lbs  Ideal Body Weight:  67.27 kg  BMI:  Body mass index is 18.31 kg/m.  Estimated Nutritional Needs:   Kcal:  1700-1900  Protein:  80-90 grams  Fluid:  1.9 L/day  EDUCATION NEEDS:   No education needs identified at this time  [place], CSP, LDN Inpatient Clinical Dietitian Pager: [phone] After Hours Pager: [phone]

## 2016-05-09 NOTE — Progress Notes (Signed)
STROKE TEAM PROGRESS NOTE   HISTORY OF PRESENT ILLNESS (per record) Brian [name] is an 82 y.o. male with past medical history of TIA, hypertension, atrial flutter. Patient was brought to the emergency department as code stroke after he was found down in the kitchen with left-sided hemiparesis. Patient's last known normal was 6:15 PM the prior night. Per son as 6:15 this morning patient called out to him and he was found on the floor not moving his left side. Patient was brought in for immediate CT scan which showed a right hyperdense MCA. At that time patient was considered to be a possible wake up stroke. Patient was brought for immediate CT angiogram and perfusion. CT perfusion showed significant amount of viable tissue. For this reason he was brought to intervention radiology.  Date last known well: Date: 05/03/2016 Time last known well: Time: 18:15 tPA Given: No: out of window   Cerebral Angiogram Rt common carotid arteriogram,followed by complete angiographic revascularization of occluded RT MCA M1 seg using x1 pass with solitaire 54mmx 40 mm retrieval device and 3.6 mg of superselective IA integrelin achieving a TICI 2b/TICI 3 reperfusion   SUBJECTIVE (INTERVAL HISTORY) His family is not at the bedside.   Overall he feels his condition is stable He states he wants to go home due to religious holiday in next 1-2 days and believes he has enough support at home to safely manage.  OBJECTIVE Temp:  [97.8 F (36.6 C)-98.7 F (37.1 C)] 97.8 F (36.6 C) (09/19 1003) Pulse Rate:  [50-110] 50 (09/19 1003) Cardiac Rhythm: Atrial fibrillation (09/18 2000) Resp:  [14-21] 19 (09/19 1003) BP: (104-135)/(63-89) 123/67 (09/19 1003) SpO2:  [98 %-100 %] 100 % (09/19 1003) Weight:  [116 lb 14.4 oz (53 kg)] 116 lb 14.4 oz (53 kg) (09/18 2309)  CBC:   Recent Labs Lab 05/04/16 0920  05/05/16 0500 05/06/16 0606  WBC 9.3  --  6.8 6.9  NEUTROABS 7.9*  --  5.0  --   HGB 13.7  < > 11.3* 10.8*  HCT  41.4  < > 34.4* 34.0*  MCV 90.2  --  90.1 91.6  PLT 141*  --  128* 124*  < > = values in this interval not displayed.  Basic Metabolic Panel:   Recent Labs Lab 05/06/16 0606 05/07/16 0753  NA 142 144  K 3.3* 3.3*  CL 112* 109  CO2 24 28  GLUCOSE 111* 99  BUN 10 10  CREATININE 0.54* 0.71  CALCIUM 8.2* 8.3*  MG 1.6* 1.8  PHOS 2.0* 2.4*    Lipid Panel:     Component Value Date/Time   CHOL 114 05/05/2016 0500   TRIG 54 05/05/2016 0500   HDL 41 05/05/2016 0500   CHOLHDL 2.8 05/05/2016 0500   VLDL 11 05/05/2016 0500   LDLCALC 62 05/05/2016 0500   HgbA1c:  Lab Results  Component Value Date   HGBA1C 5.3 05/05/2016   Urine Drug Screen: No results found for: LABOPIA, COCAINSCRNUR, LABBENZ, AMPHETMU, THCU, LABBARB    IMAGING I have personally reviewed the radiological images below and agree with the radiology interpretations.  Ct Angio Head and Neck and CT perfusion 05/04/2016 Thrombus with occlusion of the right carotid terminus with poor flow to the right middle cerebral artery branches (collateral flow to distal right middle cerebral artery branches). This is causing decreased caliber of the right internal carotid artery petrous and cavernous segment without high-grade proximal stenosis.  Large area of ischemia involving the right middle cerebral artery  distribution.   Ct Head Wo Contrast 05/04/2016 HEAD CT:  1. Hyperdense right middle cerebral artery sign accompanied by subtle decreased attenuation along the right insular cortex. This suggests a recent right middle cerebral artery infarct due to an M1 segment embolus/thrombus. Recommend follow-up brain MRI.  2. No other acute intracranial abnormalities. No intracranial hemorrhage.   Mr Jodene Nam Head/brain Wo Cm 05/05/2016 1. Acute infarct of the right caudate head. No midline shift or significant mass effect.  2. Focal susceptibility within the right basal ganglia in the location of intraparenchymal hyperattenuation seen on  the prior head CT. This is likely due to the presence of deoxyhemoglobin, favoring petechial hemorrhage over contrast staining.  3. Normal MRA of the circle of Willis.   Dg Chest Port 1 View 05/05/2016 Minimal bibasilar atelectasis. Enlargement cardiac silhouette post MVR. Electronically Signed     Ct Head Code Stroke Wo Contrast` 05/04/2016 1. Mild blush of hyperdensity involving the right basal ganglia, most consistent with contrast staining status post recent catheter directed arteriogram.  No definite acute intracranial hemorrhage identified, although close interval follow-up is warranted. 2. No other significant evolving right MCA territory infarct identified at this time. 3. No other new acute intracranial process.   2-D Echo Left ventricle: The cavity size was normal. Wall thickness was   normal. Systolic function was normal. The estimated ejection   fraction was in the range of 55% to 60%. Wall motion was normal;   there were no regional wall motion abnormalities   PHYSICAL EXAM  Temp:  [97.8 F (36.6 C)-98.7 F (37.1 C)] 97.8 F (36.6 C) (09/19 1003) Pulse Rate:  [50-110] 50 (09/19 1003) Resp:  [14-21] 19 (09/19 1003) BP: (104-135)/(63-89) 123/67 (09/19 1003) SpO2:  [98 %-100 %] 100 % (09/19 1003) Weight:  [116 lb 14.4 oz (53 kg)] 116 lb 14.4 oz (53 kg) (09/18 2309)  General - Well nourished, well developed, sitting up in bedside chair  Ophthalmologic - Fundi not visualized.  Cardiovascular - irregularly irregular heart rate and rhythm.  Neuro - awake, alert,   eyes open on voice, follows all central and peripheral commands. PERRL, blinking to visual threat bilaterally, positive corneal and gag reflexes. RUE and RLE 5/5, LLE 5/5 and LUE 4/5 with 3/5 finger grasp and difficulty with dexterity. Sensation symmetrical bilaterally, FTN intact bilaterally although slow on the left side. DTR 1+ and no Babinski. Gait not tested    ASSESSMENT/PLAN Mr. JAIMIE EDGECOMB is a 82  y.o. male with history of previous TIA, history of venous thrombosis and embolism, hypertension, previous GI bleed, epistaxis, anemia and atrial flutter with warfarin therapy ( INR 2.8 at time of admission) presenting with left hemiparesis. He did not receive IV t-PA due to late presentation and warfarin.  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 Fib s/p mech embolectomy and recanalization  Resultant  intubated, right upper extremity mild paresis  MRI - Acute infarct of the right BG with petechiae hemorrhagic transformation.  MRA - Normal MRA of the circle of Willis.   CTA head and neck - right ICA terminus occlusion  Cerebral angiogram - complete revascularization of occluded right MCA, TICI 2b/TICI 3 reperfusion 2D Echo - Left ventricle: The cavity size was normal. Wall thickness was   normal. Systolic function was normal. The estimated ejection   fraction was in the range of 55% to 60%. Wall motion was normal;    there were no regional wall motion  abnormalities  LDL - 62  HgbA1c 5.3  VTE prophylaxis - SCDs and INR 2.88 DIET DYS 2 Room service appropriate? Yes; Fluid consistency: Nectar Thick  warfarin daily prior to admission, currently Coumadin on hold but INR 2.88. Due to hemorrhagic transformation with high INR, will use Kcentra to reverse INR to avoid further hemorrhagic transformation.  Continue aspirin for stroke prevention and subcutaneous heparin for DVT prophylaxis.   Patient counseled to be compliant with his antithrombotic medications  Ongoing aggressive stroke risk factor management  Therapy recommendations: CLR Disposition: CLR Persistent A. fib on Coumadin  INR on admission 2.8  Coumadin on hold, however, INR in a.m. 2.88  Reversal with Kcentra to avoid further hemorrhagic transformation  May consider DOACs after 1 week post stroke  History of GI bleeding  Has been years ago  No recent GI  bleeding on Coumadin  Hb 13.7 -> 11.3  Close monitoring   CBC in a.m.  Hypertension  Blood pressure running somewhat low  Permissive hypertension (OK if < 180/105 due to high INR) but gradually normalize in 5-7 days  Long-term BP goal normotensive  Hyperlipidemia  Home meds:  No lipid lowering medications prior to admission  LDL 62, goal < 70  Other Stroke Risk Factors  Advanced age  Cigarette smoker - already quit  Hx of TIA  Other Active Problems  Mild thrombocytopenia, platelet 128  Hypokalemia - 3.2, supplement  Hospital day # 5 Plan Continue therapies and likely discharge home tomorrow unless he agrees to inpatient rehab and they have a bed. .Repeat Ct head in am and likely start DOAC if hemorrhage isodense Greater than 50% of time during this 25 minute visit was spent on counseling and coordination of care about this stroke, hemorrhage and atrial fibrillation  Antony Contras, MD Stroke Neurology 05/09/2016  1:12 PM    To contact Stroke Continuity provider, please refer to [email or web address] After hours, contact General Neurology

## 2016-05-09 NOTE — Progress Notes (Signed)
Noted pt ambulated 1000 feet with RW with therapy today.at supervision level. He is not in need of an inpt rehab admission at this level.Recommend HH with 24/7 supervision of family. [id number]

## 2016-05-10 ENCOUNTER — Inpatient Hospital Stay (HOSPITAL_COMMUNITY): Payer: Medicare Other

## 2016-05-10 MED ORDER — APIXABAN 2.5 MG PO TABS
2.5000 mg | ORAL_TABLET | Freq: Two times a day (BID) | ORAL | Status: DC
  Administered 2016-05-10: 2.5 mg via ORAL
  Filled 2016-05-10: qty 1

## 2016-05-10 MED ORDER — APIXABAN 2.5 MG PO TABS: 2.5000 mg | ORAL_TABLET | Freq: Two times a day (BID) | ORAL | 4 refills | Status: DC

## 2016-05-10 MED ORDER — RESOURCE THICKENUP CLEAR PO POWD: ORAL | 11 refills | Status: DC

## 2016-05-10 MED ORDER — INFLUENZA VAC SPLIT QUAD 0.5 ML IM SUSY
0.5000 mL | PREFILLED_SYRINGE | INTRAMUSCULAR | Status: AC
  Administered 2016-05-10: 0.5 mL via INTRAMUSCULAR

## 2016-05-10 MED ORDER — PANTOPRAZOLE SODIUM 40 MG PO TBEC
40.0000 mg | DELAYED_RELEASE_TABLET | Freq: Every day | ORAL | Status: DC
  Administered 2016-05-10: 40 mg via ORAL
  Filled 2016-05-10: qty 1

## 2016-05-10 NOTE — Progress Notes (Signed)
STROKE TEAM PROGRESS NOTE   HISTORY OF PRESENT ILLNESS (per record) Brian [name] is an 82 y.o. male with past medical history of TIA, hypertension, atrial flutter. Patient was brought to the emergency department as code stroke after he was found down in the kitchen with left-sided hemiparesis. Patient's last known normal was 6:15 PM the prior night. Per son as 6:15 this morning patient called out to him and he was found on the floor not moving his left side. Patient was brought in for immediate CT scan which showed a right hyperdense MCA. At that time patient was considered to be a possible wake up stroke. Patient was brought for immediate CT angiogram and perfusion. CT perfusion showed significant amount of viable tissue. For this reason he was brought to intervention radiology.  Date last known well: Date: 05/03/2016 Time last known well: Time: 18:15 tPA Given: No: out of window   Cerebral Angiogram Rt common carotid arteriogram,followed by complete angiographic revascularization of occluded RT MCA M1 seg using x1 pass with solitaire 71mmx 40 mm retrieval device and 3.6 mg of superselective IA integrelin achieving a TICI 2b/TICI 3 reperfusion   SUBJECTIVE (INTERVAL HISTORY) His son is t at the bedside.   Overall he feels his condition is stable He states he wants to go home due to religious holiday   and believes he has enough support at home to safely manage.Repeat Ct head shows stable reolving hematoma.  OBJECTIVE Temp:  [97.7 F (36.5 C)-98.3 F (36.8 C)] 97.7 F (36.5 C) (09/20 1426) Pulse Rate:  [63-100] 83 (09/20 1426) Resp:  [18-19] 18 (09/20 1426) BP: (106-136)/(52-85) 106/85 (09/20 1426) SpO2:  [96 %-100 %] 100 % (09/20 1426)  CBC:   Recent Labs Lab 05/04/16 0920  05/05/16 0500 05/06/16 0606  WBC 9.3  --  6.8 6.9  NEUTROABS 7.9*  --  5.0  --   HGB 13.7  < > 11.3* 10.8*  HCT 41.4  < > 34.4* 34.0*  MCV 90.2  --  90.1 91.6  PLT 141*  --  128* 124*  < > = values in  this interval not displayed.  Basic Metabolic Panel:   Recent Labs Lab 05/06/16 0606 05/07/16 0753  NA 142 144  K 3.3* 3.3*  CL 112* 109  CO2 24 28  GLUCOSE 111* 99  BUN 10 10  CREATININE 0.54* 0.71  CALCIUM 8.2* 8.3*  MG 1.6* 1.8  PHOS 2.0* 2.4*    Lipid Panel:     Component Value Date/Time   CHOL 114 05/05/2016 0500   TRIG 54 05/05/2016 0500   HDL 41 05/05/2016 0500   CHOLHDL 2.8 05/05/2016 0500   VLDL 11 05/05/2016 0500   LDLCALC 62 05/05/2016 0500   HgbA1c:  Lab Results  Component Value Date   HGBA1C 5.3 05/05/2016   Urine Drug Screen: No results found for: LABOPIA, COCAINSCRNUR, LABBENZ, AMPHETMU, THCU, LABBARB    IMAGING I have personally reviewed the radiological images below and agree with the radiology interpretations.  Ct Angio Head and Neck and CT perfusion 05/04/2016 Thrombus with occlusion of the right carotid terminus with poor flow to the right middle cerebral artery branches (collateral flow to distal right middle cerebral artery branches). This is causing decreased caliber of the right internal carotid artery petrous and cavernous segment without high-grade proximal stenosis.  Large area of ischemia involving the right middle cerebral artery distribution.   Ct Head Wo Contrast 05/04/2016 HEAD CT:  1. Hyperdense right middle cerebral artery sign  accompanied by subtle decreased attenuation along the right insular cortex. This suggests a recent right middle cerebral artery infarct due to an M1 segment embolus/thrombus. Recommend follow-up brain MRI.  2. No other acute intracranial abnormalities. No intracranial hemorrhage.   Mr Brian [name] Head/brain Wo Cm 05/05/2016 1. Acute infarct of the right caudate head. No midline shift or significant mass effect.  2. Focal susceptibility within the right basal ganglia in the location of intraparenchymal hyperattenuation seen on the prior head CT. This is likely due to the presence of deoxyhemoglobin, favoring  petechial hemorrhage over contrast staining.  3. Normal MRA of the circle of Willis.   Dg Chest Port 1 View 05/05/2016 Minimal bibasilar atelectasis. Enlargement cardiac silhouette post MVR. Electronically Signed     Ct Head Code Stroke Wo Contrast` 05/04/2016 1. Mild blush of hyperdensity involving the right basal ganglia, most consistent with contrast staining status post recent catheter directed arteriogram.  No definite acute intracranial hemorrhage identified, although close interval follow-up is warranted. 2. No other significant evolving right MCA territory infarct identified at this time. 3. No other new acute intracranial process.   2-D Echo Left ventricle: The cavity size was normal. Wall thickness was   normal. Systolic function was normal. The estimated ejection   fraction was in the range of 55% to 60%. Wall motion was normal;   there were no regional wall motion abnormalities  CT head 05/10/2016 : Hemorrhagic right basal ganglia infarct is stable from prior. No new abnormality  PHYSICAL EXAM  Temp:  [97.7 F (36.5 C)-98.3 F (36.8 C)] 97.7 F (36.5 C) (09/20 1426) Pulse Rate:  [63-100] 83 (09/20 1426) Resp:  [18-19] 18 (09/20 1426) BP: (106-136)/(52-85) 106/85 (09/20 1426) SpO2:  [96 %-100 %] 100 % (09/20 1426)  General - Well nourished, well developed, sitting up in bedside chair  Ophthalmologic - Fundi not visualized.  Cardiovascular - irregularly irregular heart rate and rhythm.  Neuro - awake, alert,   eyes open on voice, follows all central and peripheral commands. PERRL, blinking to visual threat bilaterally, positive corneal and gag reflexes. RUE and RLE 5/5, LLE 5/5 and LUE 4/5 with 3/5 finger grasp and difficulty with dexterity. Sensation symmetrical bilaterally, FTN intact bilaterally although slow on the left side. DTR 1+ and no Babinski. Gait not tested    ASSESSMENT/PLAN [name] is a 82 y.o. male with history of previous TIA, history of  venous thrombosis and embolism, hypertension, previous GI bleed, epistaxis, anemia and atrial flutter with warfarin therapy ( INR 2.8 at time of admission) presenting with left hemiparesis. He did not receive IV t-PA due to late presentation and warfarin.  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 Fib s/p mech embolectomy and recanalization  Resultant  intubated, right upper extremity mild paresis  MRI - Acute infarct of the right BG with petechiae hemorrhagic transformation.  MRA - Normal MRA of the circle of Willis.   CTA head and neck - right ICA terminus occlusion  Cerebral angiogram - complete revascularization of occluded right MCA, TICI 2b/TICI 3 reperfusion 2D Echo - Left ventricle: The cavity size was normal. Wall thickness was   normal. Systolic function was normal. The estimated ejection   fraction was in the range of 55% to 60%. Wall motion was normal;    there were no regional wall motion abnormalities  LDL - 62  HgbA1c 5.3  VTE prophylaxis - SCDs and INR 2.88 DIET DYS  3 Room service appropriate? Yes; Fluid consistency: Nectar Thick  warfarin daily prior to admission, currently Coumadin on hold but INR 2.88. Due to hemorrhagic transformation with high INR, will use Kcentra to reverse INR to avoid further hemorrhagic transformation.  Continue aspirin for stroke prevention and subcutaneous heparin for DVT prophylaxis.   Patient counseled to be compliant with his antithrombotic medications  Ongoing aggressive stroke risk factor management  Therapy recommendations: CLR Disposition: CLR Persistent A. fib on Coumadin  INR on admission 2.8  Coumadin on hold, however, INR in a.m. 2.88  Reversal with Kcentra to avoid further hemorrhagic transformation  Start DOACs after 1 week post stroke  History of GI bleeding  Has been years ago  No recent GI bleeding on Coumadin  Hb 13.7 -> 11.3  Close  monitoring   CBC in a.m.  Hypertension  Blood pressure running somewhat low  Permissive hypertension (OK if < 180/105 due to high INR) but gradually normalize in 5-7 days  Long-term BP goal normotensive  Hyperlipidemia  Home meds:  No lipid lowering medications prior to admission  LDL 62, goal < 70  Other Stroke Risk Factors  Advanced age  Cigarette smoker - already quit  Hx of TIA  Other Active Problems  Mild thrombocytopenia, platelet 128  Hypokalemia - 3.2, supplement  Hospital day # 6 Plan  discharge home today with home therapies. Lond d/w patient and son and answered questions. Stop aspirin and start eliquis for afib and stroke preventiondense Greater than 50% of time during this 25 minute visit was spent on counseling and coordination of care about this stroke, hemorrhage and atrial fibrillation  Antony Contras, MD Stroke Neurology 05/10/2016  3:54 PM    To contact Stroke Continuity provider, please refer to [email or web address] After hours, contact General Neurology

## 2016-05-10 NOTE — Progress Notes (Signed)
Speech Language Pathology Treatment: Dysphagia  Patient Details Name: Brian [name] MRN: [id number] DOB: 1932/11/10 Today's Date: 05/10/2016 Time: [id number] SLP Time Calculation (min) (ACUTE ONLY): 23 min  Assessment / Plan / Recommendation Clinical Impression  Pt seen for dysphagia follow up. SLP reviewed/educated pt and son re: MBS results and recommendations. Pt able to recall one strategy and needed moderate verbal cues to implement swallow 2-3 time, clear throat and alternate liquids and solids. Cough x 1 due to phonating immediately after swallow. No cough after nectar thick liquids. Educated re: thickened liquids (where to purchase, how to mix etc) as well as follow up ST recommendation for Home Health (long wait list at outpatient) and follow up MBS for potential liquid upgrade. Upgraded po's to Dys 3, continue nectar thick liquids. Pt very motivated and positive to improve function post stroke.    HPI HPI: Brian [name] an 82 y.o.malewith past medical history of TIA, hypertension, atrial flutter.Patient was brought to the emergency department as code stroke after he was found down in the kitchen with left-sided hemiparesis.Patient's last known normal was 6:15 PM the prior night. Per son as 6:15 this morning patient called out to him and he was found on the floor not moving his left side. Patient was brought in for immediate CT scan which showed a right hyperdense MCA.He was subsequently taken to IR for neuro intervention. Intubated until 9/16.       SLP Plan  Continue with current plan of care     Recommendations  Diet recommendations: Dysphagia 3 (mechanical soft);Nectar-thick liquid Liquids provided via: Cup;No straw Medication Administration: Whole meds with puree Supervision: Patient able to self feed;Intermittent supervision to cue for compensatory strategies Compensations: Slow rate;Small sips/bites;Multiple dry swallows after each bite/sip;Follow solids with  liquid;Lingual sweep for clearance of pocketing Postural Changes and/or Swallow Maneuvers: Seated upright 90 degrees                Oral Care Recommendations: Oral care BID Follow up Recommendations: Home health SLP Plan: Continue with current plan of care       GO                Brian [name] 05/10/2016, 1:54 PM

## 2016-05-10 NOTE — Care Management Note (Signed)
Case Management Note  Patient Details  Name: Brian [name] MRN: [id number] Date of Birth: 10-18-1932  Subjective/Objective:                    Action/Plan: Pt discharging home with [facility] services. CM met with the patient and his son and provided them a list of [facility] agencies in the [place] area. Pt asked CM to speak to his wife about HH. CM spoke with Mrs Percival on the phone and she asked to use [facility]. Butch Penny with [facility] notified and accepted the referral. Pt also discharging on Eliquis. Pt's son given 30 day free card and instructed to give it to pharmacist when picking up pts medications. He verbalized understanding.   Expected Discharge Date:                  Expected Discharge Plan:  [place]  In-House Referral:     Discharge planning Services  CM Consult  Post Acute Care Choice:  Home Health Choice offered to:  Patient, Spouse, Adult Children  DME Arranged:    DME Agency:     HH Arranged:  PT, OT, Speech Therapy [place] Agency:  [place]  Status of Service:  Completed, signed off  If discussed at [place] of Stay Meetings, dates discussed:    Additional Comments:  Pollie Friar, RN 05/10/2016, 3:52 PM

## 2016-05-10 NOTE — Discharge Instructions (Signed)
Information on my medicine - ELIQUIS® (apixaban) ° °This medication education was reviewed with me or my healthcare representative as part of my discharge preparation.  The pharmacist that spoke with me during my hospital stay was:  Bell, Michelle T, RPH ° °Why was Eliquis® prescribed for you? °Eliquis® was prescribed for you to reduce the risk of a blood clot forming that can cause a stroke if you have a medical condition called atrial fibrillation (a type of irregular heartbeat). ° °What do You need to know about Eliquis® ? °Take your Eliquis® TWICE DAILY - one tablet in the morning and one tablet in the evening with or without food. If you have difficulty swallowing the tablet whole please discuss with your pharmacist how to take the medication safely. ° °Take Eliquis® exactly as prescribed by your doctor and DO NOT stop taking Eliquis® without talking to the doctor who prescribed the medication.  Stopping may increase your risk of developing a stroke.  Refill your prescription before you run out. ° °After discharge, you should have regular check-up appointments with your healthcare provider that is prescribing your Eliquis®.  In the future your dose may need to be changed if your kidney function or weight changes by a significant amount or as you get older. ° °What do you do if you miss a dose? °If you miss a dose, take it as soon as you remember on the same day and resume taking twice daily.  Do not take more than one dose of ELIQUIS at the same time to make up a missed dose. ° °Important Safety Information °A possible side effect of Eliquis® is bleeding. You should call your healthcare provider right away if you experience any of the following: °? Bleeding from an injury or your nose that does not stop. °? Unusual colored urine (red or dark brown) or unusual colored stools (red or black). °? Unusual bruising for unknown reasons. °? A serious fall or if you hit your head (even if there is no bleeding). ° °Some  medicines may interact with Eliquis® and might increase your risk of bleeding or clotting while on Eliquis®. To help avoid this, consult your healthcare provider or pharmacist prior to using any new prescription or non-prescription medications, including herbals, vitamins, non-steroidal anti-inflammatory drugs (NSAIDs) and supplements. ° °This website has more information on Eliquis® (apixaban): http://www.eliquis.com/eliquis/homeInformation on my medicine - ELIQUIS® (apixaban) ° °This medication education was reviewed with me or my healthcare representative as part of my discharge preparation.  The pharmacist that spoke with me during my hospital stay was:  Bell, Michelle T, RPH ° °Why was Eliquis® prescribed for you? °Eliquis® was prescribed for you to reduce the risk of a blood clot forming that can cause a stroke if you have a medical condition called atrial fibrillation (a type of irregular heartbeat). ° °What do You need to know about Eliquis® ? °Take your Eliquis® TWICE DAILY - one tablet in the morning and one tablet in the evening with or without food. If you have difficulty swallowing the tablet whole please discuss with your pharmacist how to take the medication safely. ° °Take Eliquis® exactly as prescribed by your doctor and DO NOT stop taking Eliquis® without talking to the doctor who prescribed the medication.  Stopping may increase your risk of developing a stroke.  Refill your prescription before you run out. ° °After discharge, you should have regular check-up appointments with your healthcare provider that is prescribing your Eliquis®.  In the future your   dose may need to be changed if your kidney function or weight changes by a significant amount or as you get older.  What do you do if you miss a dose? If you miss a dose, take it as soon as you remember on the same day and resume taking twice daily.  Do not take more than one dose of ELIQUIS at the same time to make up a missed  dose.  Important Safety Information A possible side effect of Eliquis is bleeding. You should call your healthcare provider right away if you experience any of the following: ? Bleeding from an injury or your nose that does not stop. ? Unusual colored urine (red or dark brown) or unusual colored stools (red or black). ? Unusual bruising for unknown reasons. ? A serious fall or if you hit your head (even if there is no bleeding).  Some medicines may interact with Eliquis and might increase your risk of bleeding or clotting while on Eliquis. To help avoid this, consult your healthcare provider or pharmacist prior to using any new prescription or non-prescription medications, including herbals, vitamins, non-steroidal anti-inflammatory drugs (NSAIDs) and supplements.  This website has more information on Eliquis (apixaban): http://www.eliquis.com/eliquis/home   1. Gradually increase activity as tolerated. 2. Take only the medications as listed.  3. Follow-up with your primary care physician in approximately 2 weeks for further monitoring of your blood pressure and to resume blood pressure medications as indicated. 4. Outpatient physical, occupational, and speech therapies. 5. Thicken all fluids using Thicken Up product as instructed by speech therapy. 6. Follow-up with Dr. Leonie Man in approximately 2 months. The office will call you to schedule. 7. Eliquis will take the place of your coumadin. Do not take Coumadin anymore.

## 2016-05-10 NOTE — Discharge Summary (Signed)
Stroke Discharge Summary  Patient ID: Brian [name]   MRN: [id number]      DOB: August 30, 1932  Date of Admission: 05/04/2016 Date of Discharge: 05/10/2016  Attending Physician:  Garvin Fila, MD, Stroke MD Consulting Physician(s):   Treatment Team:  Md Stroke, MD pulmonary/intensive care and rehabilitation medicine  Patient's PCP:  Nyoka Cowden, MD  DISCHARGE DIAGNOSIS: Right BG infarct with petechiae MRI transformation embolic due to right MCA occlusion from A. Fib s/p mech embolectomy and recanalization. Active Problems:   CVA (cerebral infarction)   Acute CVA (cerebrovascular accident) ([place])   Cerebrovascular accident (CVA) due to thrombosis of precerebral artery (HCC)   Stroke (cerebrum) (HCC)   Arterial hypotension   Hypocalcemia   Acute respiratory failure (HCC)   Cerebral infarction due to thrombosis of precerebral artery ([place])   Cerebrovascular accident (CVA) due to thrombosis of right middle cerebral artery (HCC)   Benign essential HTN   TIA (transient ischemic attack)   Coronary artery disease involving native coronary artery of native heart without angina pectoris   H/O mitral valve repair   Tachypnea   Paroxysmal atrial fibrillation (HCC)   Hypokalemia   Acute blood loss anemia   Thrombocytopenia (HCC)   Dysphagia   Dysarthria, post-stroke   Dysphagia, post-stroke  BMI: Body mass index is 18.31 kg/m.  Past Medical History:  Diagnosis Date  . Anemia   . Atrial flutter ([place])   . BPH (benign prostatic hypertrophy)   . Epistaxis   . Hemorrhage of gastrointestinal tract, unspecified   . Hypertension   . Osteoarthrosis, unspecified whether generalized or localized, unspecified site   . Personal history of venous thrombosis and embolism   . Rosacea   . TIA (transient ischemic attack)    Past Surgical History:  Procedure Laterality Date  . APPENDECTOMY    . HERNIA REPAIR    . IR GENERIC HISTORICAL  05/04/2016   IR PERCUTANEOUS ART  THROMBECTOMY/INFUSION INTRACRANIAL INC DIAG ANGIO 05/04/2016 [name], MD [facility]  . IR GENERIC HISTORICAL  05/04/2016   IR ANGIO VERTEBRAL SEL SUBCLAVIAN INNOMINATE UNI R MOD SED 05/04/2016 [name], MD [facility]  . JOINT REPLACEMENT    . MITRAL VALVE REPAIR    . mvp repair    . RADIOLOGY WITH ANESTHESIA N/A 05/04/2016   Procedure: RADIOLOGY WITH ANESTHESIA;  Surgeon: [name], MD;  Location: [facility];  Service: Radiology;  Laterality: N/A;  . TOTAL HIP ARTHROPLASTY        Medication List    STOP taking these medications   benazepril-hydrochlorthiazide 5-6.25 MG tablet Commonly known as:  LOTENSIN HCT   doxazosin 1 MG tablet Commonly known as:  CARDURA   warfarin 5 MG tablet Commonly known as:  COUMADIN     TAKE these medications   ALPRAZolam 0.25 MG tablet Commonly known as:  XANAX Take 1 tablet (0.25 mg total) by mouth at bedtime.   apixaban 2.5 MG Tabs tablet Commonly known as:  ELIQUIS Take 1 tablet (2.5 mg total) by mouth 2 (two) times daily.   clindamycin 150 MG capsule Commonly known as:  CLEOCIN TAKE 2 CAPSULES BY MOUTH PRIOR TO DENTAL WORK AS DIRECTED   doxycycline 100 MG tablet Commonly known as:  VIBRA-TABS TAKE 1 TABLET BY MOUTH TWICE DAILY   finasteride 5 MG tablet Commonly known as:  PROSCAR Take 1 tablet (5 mg total) by mouth daily.   metoprolol succinate 25 MG 24 hr tablet Commonly known as:  TOPROL-XL  TAKE 1 TABLET BY MOUTH EVERY MORNING AND 2 TABLETS BY MOUTH EVERY EVENING   pantoprazole 40 MG tablet Commonly known as:  PROTONIX Take 1 tablet (40 mg total) by mouth daily.   PROCTOZONE-HC 2.5 % rectal cream Generic drug:  hydrocortisone USE RECTALLY TWICE DAILY   RESOURCE THICKENUP CLEAR Powd Nectar thick liquids only until cleared by speech therapist. Use Thicken up to thicken liquids.   saw palmetto 160 MG capsule Take 1 capsule (160 mg total) by mouth 2 (two) times daily.       LABORATORY  STUDIES CBC    Component Value Date/Time   WBC 6.9 05/06/2016 0606   RBC 3.71 (L) 05/06/2016 0606   HGB 10.8 (L) 05/06/2016 0606   HCT 34.0 (L) 05/06/2016 0606   PLT 124 (L) 05/06/2016 0606   MCV 91.6 05/06/2016 0606   MCH 29.1 05/06/2016 0606   MCHC 31.8 05/06/2016 0606   RDW 15.0 05/06/2016 0606   LYMPHSABS 1.1 05/05/2016 0500   MONOABS 0.7 05/05/2016 0500   EOSABS 0.0 05/05/2016 0500   BASOSABS 0.0 05/05/2016 0500   CMP    Component Value Date/Time   NA 144 05/07/2016 0753   K 3.3 (L) 05/07/2016 0753   CL 109 05/07/2016 0753   CO2 28 05/07/2016 0753   GLUCOSE 99 05/07/2016 0753   BUN 10 05/07/2016 0753   CREATININE 0.71 05/07/2016 0753   CREATININE 0.63 01/23/2011 0920   CALCIUM 8.3 (L) 05/07/2016 0753   PROT 6.5 05/04/2016 0920   ALBUMIN 3.8 05/04/2016 0920   AST 35 05/04/2016 0920   ALT 29 05/04/2016 0920   ALKPHOS 97 05/04/2016 0920   BILITOT 1.0 05/04/2016 0920   GFRNONAA >60 05/07/2016 0753   GFRNONAA >60 01/23/2011 0920   GFRAA >60 05/07/2016 0753   GFRAA >60 01/23/2011 0920   COAGS Lab Results  Component Value Date   INR 1.28 05/08/2016   INR 1.32 05/07/2016   INR 1.23 05/06/2016   PROTIME 22.5 02/03/2009   Lipid Panel    Component Value Date/Time   CHOL 114 05/05/2016 0500   TRIG 54 05/05/2016 0500   HDL 41 05/05/2016 0500   CHOLHDL 2.8 05/05/2016 0500   VLDL 11 05/05/2016 0500   LDLCALC 62 05/05/2016 0500   HgbA1C  Lab Results  Component Value Date   HGBA1C 5.3 05/05/2016   Cardiac Panel (last 3 results) No results for input(s): CKTOTAL, CKMB, TROPONINI, RELINDX in the last 72 hours. Urinalysis    Component Value Date/Time   COLORURINE YELLOW 05/04/2016 1051   APPEARANCEUR CLEAR 05/04/2016 1051   LABSPEC 1.027 05/04/2016 1051   PHURINE 5.5 05/04/2016 1051   GLUCOSEU NEGATIVE 05/04/2016 1051   HGBUR TRACE (A) 05/04/2016 [place] 05/04/2016 1051   BILIRUBINUR neg 07/21/2013 1211   KETONESUR NEGATIVE 05/04/2016  1051   PROTEINUR NEGATIVE 05/04/2016 1051   UROBILINOGEN 0.2 07/21/2013 1211   NITRITE NEGATIVE 05/04/2016 1051   LEUKOCYTESUR NEGATIVE 05/04/2016 1051   Urine Drug Screen No results found for: LABOPIA, COCAINSCRNUR, LABBENZ, AMPHETMU, THCU, LABBARB  Alcohol Level No results found for: [facility]   SIGNIFICANT DIAGNOSTIC STUDIES   Ct Angio Head and Neck and CT perfusion 05/04/2016 Thrombus with occlusion of the right carotid terminus with poor flow to the right middle cerebral artery branches (collateral flow to distal right middle cerebral artery branches). This is causing decreased caliber of the right internal carotid artery petrous and cavernous segment without high-grade proximal stenosis.  Large area of ischemia involving  the right middle cerebral artery distribution.   Ct Head Wo Contrast 05/04/2016 HEAD CT:  1. Hyperdense right middle cerebral artery sign accompanied by subtle decreased attenuation along the right insular cortex. This suggests a recent right middle cerebral artery infarct due to an M1 segment embolus/thrombus. Recommend follow-up brain MRI.  2. No other acute intracranial abnormalities. No intracranial hemorrhage.   Mr Jodene Nam Head/brain Wo Cm 05/05/2016 1. Acute infarct of the right caudate head. No midline shift or significant mass effect.  2. Focal susceptibility within the right basal ganglia in the location of intraparenchymal hyperattenuation seen on the prior head CT. This is likely due to the presence of deoxyhemoglobin, favoring petechial hemorrhage over contrast staining.  3. Normal MRA of the circle of Willis.   Dg Chest Port 1 View 05/05/2016 Minimal bibasilar atelectasis. Enlargement cardiac silhouette post MVR. Electronically Signed     Ct Head Code Stroke Wo Contrast` 05/04/2016 1. Mild blush of hyperdensity involving the right basal ganglia, most consistent with contrast staining status post recent catheter directed arteriogram.  No definite acute  intracranial hemorrhage identified, although close interval follow-up is warranted. 2. No other significant evolving right MCA territory infarct identified at this time. 3. No other new acute intracranial process.   2-D Echo Left ventricle: The cavity size was normal. Wall thickness was normal. Systolic function was normal. The estimated ejection fraction was in the range of 55% to 60%. Wall motion was normal; there were no regional wall motion abnormalities   Cerebral Angiogram Rt common carotid arteriogram,followed by complete angiographic revascularization of occluded RT MCA M1 seg using x1 pass with solitaire 4mmx 40 mm retrieval device and 3.6 mg of superselective IA integrelin achieving a TICI 2b/TICI 3 reperfusion     HISTORY OF PRESENT ILLNESS Brian [name] is an 82 y.o. male with past medical history of TIA, hypertension, atrial flutter. Patient was brought to the emergency department as code stroke after he was found down in the kitchen with left-sided hemiparesis. Patient's last known normal was 6:15 PM the prior night. Per son at 6:15 on the morning of admission, patient called out to him and he was found on the floor not moving his left side. Patient was brought in for immediate CT scan which showed a right hyperdense MCA. At that time patient was considered to be a possible wake up stroke. Patient was brought for immediate CT angiogram and perfusion. CT perfusion showed significant amount of viable tissue. For this reason he was brought to intervention radiology.    HOSPITAL COURSE  Brian [name] is a 82 y.o. male with history of previous TIA, history of venous thrombosis and embolism, hypertension, previous GI bleed, epistaxis, anemia and atrial flutter with warfarin therapy ( INR 2.8 at time of admission) presenting with left hemiparesis. He did not receive IV t-PA due to late presentation and warfarin. He was taken to IR.  Interventional radiology Cerebral  Angiogram Rt common carotid arteriogram,followed by complete angiographic revascularization of occluded RT MCA M1 seg using x1 pass with solitaire 32mmx 40 mm retrieval device and 3.6 mg of superselective IA integrelin achieving a TICI 2b/TICI 3 reperfusion. Following the procedure the patient was admitted to the neuro intensive care unit for close monitoring.   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 Fib s/p mech embolectomy and recanalization  Resultant  intubated, right upper extremity mild paresis  MRI - Acute infarct of the right BG  with petechiae hemorrhagic transformation.  MRA - Normal MRA of the circle of Willis.   CTA head and neck - right ICA terminus occlusion  Cerebral angiogram - complete revascularization of occluded right MCA, TICI 2b/TICI 3 reperfusion 2D Echo - Left ventricle: The cavity size was normal. Wall thickness was normal. Systolic function was normal. The estimated ejection fraction was in the range of 55% to 60%. Wall motion was normal;  there were no regional wall motion abnormalities  LDL - 62  HgbA1c 5.3  VTE prophylaxis - SCDs and INR 2.88  DIET DYS 3 Room service appropriate? Yes; Fluid consistency: Nectar Thick  warfarin daily prior to admission, currently Coumadin on hold but INR 2.88. Due to hemorrhagic transformation with high INR, will use Kcentra to reverse INR to avoid further hemorrhagic transformation.  Continue aspirin for stroke prevention and subcutaneous heparin for DVT prophylaxis.   Patient counseled to be compliant with his antithrombotic medications  Ongoing aggressive stroke risk factor management  Therapy recommendations: The patient declined inpatient rehabilitation. Instead he elected to have home health therapies.  Persistent A. fib on Coumadin  INR on admission 2.8  Coumadin on hold, however, INR in a.m. 2.88  Reversal with Kcentra to avoid further  hemorrhagic transformation  Following a repeat CT of the head the patient was started on Eliquis prior to discharge   History of GI bleeding  Has been years ago  No recent GI bleeding on Coumadin  Hb 13.7 -> 11.3 -> 10.8  Close monitoring    Hypertension  Blood pressure running somewhat low              Permissive hypertension (OK if < 180/105 due to high INR) but gradually normalize in 5-7 days              Long-term BP goal normotensive  Hyperlipidemia  Home meds:  No lipid lowering medications prior to admission  LDL 62, goal < 70  Other Stroke Risk Factors  Advanced age  Cigarette smoker - already quit  Hx of TIA  Other Active Problems  Mild thrombocytopenia, platelet 128  Hypokalemia - 3.2, supplement -> 3.3     DISCHARGE EXAM Blood pressure 118/73, pulse 73, temperature 97.7 F (36.5 C), temperature source Oral, resp. rate 18, height 5\' 7"  (1.702 m), weight 53 kg (116 lb 14.4 oz), SpO2 100 %. General ROS: negative for - chills, fatigue, fever, night sweats, weight gain or weight loss Psychological ROS: negative for - behavioral disorder, hallucinations, memory difficulties, mood swings or suicidal ideation Ophthalmic ROS: negative for - blurry vision, double vision, eye pain or loss of vision ENT ROS: negative for - epistaxis, nasal discharge, oral lesions, sore throat, tinnitus or vertigo Allergy and Immunology ROS: negative for - hives or itchy/watery eyes Hematological and Lymphatic ROS: negative for - bleeding problems, bruising or swollen lymph nodes Endocrine ROS: negative for - galactorrhea, hair pattern changes, polydipsia/polyuria or temperature intolerance Respiratory ROS: negative for - cough, hemoptysis, shortness of breath or wheezing Cardiovascular ROS: negative for - chest pain, dyspnea on exertion, edema or irregular heartbeat Gastrointestinal ROS: negative for - abdominal pain, diarrhea, hematemesis, nausea/vomiting or stool  incontinence Genito-Urinary ROS: negative for - dysuria, hematuria, incontinence or urinary frequency/urgency Musculoskeletal ROS: negative for - joint swelling or muscular weakness Neurological ROS: as noted in HPI Dermatological ROS: negative for rash and skin lesion changes  Neurologic Examination:  HEENT-  Normocephalic, no lesions, without obvious abnormality.  Normal external eye and conjunctiva.  Normal TM's bilaterally.  Normal auditory canals and external ears. Normal external nose, mucus membranes and septum.  Normal pharynx. Cardiovascular- S1, S2 normal, pulses palpable throughout   Lungs- chest clear, no wheezing, rales, normal symmetric air entry Abdomen- normal findings: bowel sounds normal Extremities- no edema Lymph-no adenopathy palpable Musculoskeletal-no joint tenderness, deformity or swelling Skin-warm and dry, no hyperpigmentation, vitiligo, or suspicious lesions  Neurological Examination Mental Status: Alert, able to follow commands on the right side and language showed severe dysarthria but no aphasia. Cranial Nerves: II: Vision shows a dense left hemianopsia,  pupils equal, round, reactive to light and accommodation III,IV, VI: ptosis present in the right eye, eyes are deviated to the right and does not cross midline V,VII: Significant left facial droop, facial light touch sensation decreased on the left face VIII: hearing normal bilaterally IX,X: Unable to visualize uvula XI: bilateral shoulder shrug equal XII: midline tongue extension Motor: Right arm and leg showed 5 out of 5 strength. Left upper extremity showed 4 out of 5 strength with shoulder abduction and bicep flexion however he had 0/5 with tricep extension and distal strength. Left leg he was able to lift antigravity with a 3+/5 strength Sensory: Pinprick and light touch was intact on the right but  not the left arm and leg  Deep Tendon Reflexes: 2+ and symmetric throughout upper extremities and diminished in lower extremities Plantars: Right: downgoing                                Left: downgoing Cerebellar: normal finger-to-nose on the right patient showed no dysmetria with the right heel to shin however he was having difficulty understanding what I wanted him to do Gait: Not tested  Discharge Diet   DIET DYS 3 Room service appropriate? Yes; Fluid consistency: Nectar Thick liquids   Discharge instructions to the patient: 1. Gradually increase activity as tolerated. 2. Take only the medications as listed.  3. Follow-up with your primary care physician in approximately 1-2 weeks for further monitoring of your blood pressure and to resume blood pressure medications as indicated. 4. Outpatient physical, occupational, and speech therapies. 5. Thicken all fluids using Thicken Up product as instructed by speech therapy. 6. Follow-up with Dr. Leonie Man in approximately 2 months. The office will call you to schedule. 7. Eliquis will take the place of your coumadin. Do not take Coumadin anymore.   DISCHARGE PLAN  Disposition:  Discharged to home in the care of his wife and son  Eliquis (apixaban) daily for secondary stroke prevention.  Ongoing risk factor control by Primary Care Physician at time of discharge  Follow-up Nyoka Cowden, MD in 2 weeks.  Follow-up with Dr. Antony Contras, Stroke Clinic in 2 months, office to schedule an appointment.  Home health physical, occupational, and speech therapies scheduled.  30 minutes were spent preparing discharge.  Mikey Bussing PA-C [facility] Pager [phone] 05/10/2016, 5:13 PM I have personally examined this patient, reviewed notes, independently viewed imaging studies, participated in medical decision making and plan of care.ROS completed by me personally and pertinent positives fully documented  I have made  any additions or clarifications directly to the above note. Agree with note above.   Antony Contras, MD Medical Director [facility] Pager: [phone] 05/10/2016 9:18 PM

## 2016-05-10 NOTE — Progress Notes (Signed)
ANTICOAGULATION CONSULT NOTE - Initial Consult  Pharmacy Consult for apixaban Indication: atrial fibrillation  Allergies  Allergen Reactions  . Novocain [Procaine Hcl]     Irregular heart beat     . Penicillins     REACTION: itching    Patient Measurements: Height: 5\' 7"  (170.2 cm) Weight: 116 lb 14.4 oz (53 kg) IBW/kg (Calculated) : 66.1   Vital Signs: Temp: 97.9 F (36.6 C) (09/20 0543) Temp Source: Oral (09/20 0543) BP: 122/52 (09/20 0543) Pulse Rate: 69 (09/20 0824)  Labs:  Recent Labs  05/08/16 0308  LABPROT 16.0*  INR 1.28    Estimated Creatinine Clearance: 54.3 mL/min (by C-G formula based on SCr of 0.71 mg/dL).   Medical History: Past Medical History:  Diagnosis Date  . Anemia   . Atrial flutter ([place])   . BPH (benign prostatic hypertrophy)   . Epistaxis   . Hemorrhage of gastrointestinal tract, unspecified   . Hypertension   . Osteoarthrosis, unspecified whether generalized or localized, unspecified site   . Personal history of venous thrombosis and embolism   . Rosacea   . TIA (transient ischemic attack)     Assessment: 82 yo M on warfarin PTA for aflutter reversed w/ Kcentra- Admit INR 2.45, now 1.28 s/p KCentra on 9/15 -  MVR- pt has MV repair w/ ring, not MV replacement.  Pharmacy consulted to start Eliquis for nonvalvular afib.  Dc sq hep, dc aspirin. pltc [phone] -was 219 in Feb 2017.  No bleeding reported. Heat CT 9/20: hemorrhagic R basal ganglia infarct is stable.  Plan: New eliquis: 2.5 bid for age >71 and wt < 60kg,  Eudelia Bunch, Pharm.D. QP:[phone] 05/10/2016 10:17 AM

## 2016-05-10 NOTE — Progress Notes (Signed)
Pt discharging with son at this time taking all personal belongings. IV discontinued, dry dressing applied. Discharge instructions provided with prescriptions with verbal understanding. Pt aware to follow up appts.

## 2016-05-11 ENCOUNTER — Other Ambulatory Visit (HOSPITAL_COMMUNITY): Payer: Self-pay | Admitting: Interventional Radiology

## 2016-05-11 ENCOUNTER — Telehealth: Payer: Self-pay | Admitting: Internal Medicine

## 2016-05-11 ENCOUNTER — Telehealth: Payer: Self-pay | Admitting: Neurology

## 2016-05-11 DIAGNOSIS — I63311 Cerebral infarction due to thrombosis of right middle cerebral artery: Secondary | ICD-10-CM

## 2016-05-11 DIAGNOSIS — D649 Anemia, unspecified: Secondary | ICD-10-CM | POA: Diagnosis not present

## 2016-05-11 DIAGNOSIS — I4891 Unspecified atrial fibrillation: Secondary | ICD-10-CM | POA: Diagnosis not present

## 2016-05-11 DIAGNOSIS — E785 Hyperlipidemia, unspecified: Secondary | ICD-10-CM | POA: Diagnosis not present

## 2016-05-11 DIAGNOSIS — I69391 Dysphagia following cerebral infarction: Secondary | ICD-10-CM | POA: Diagnosis not present

## 2016-05-11 DIAGNOSIS — Z87891 Personal history of nicotine dependence: Secondary | ICD-10-CM | POA: Diagnosis not present

## 2016-05-11 DIAGNOSIS — I69354 Hemiplegia and hemiparesis following cerebral infarction affecting left non-dominant side: Secondary | ICD-10-CM | POA: Diagnosis not present

## 2016-05-11 DIAGNOSIS — R1310 Dysphagia, unspecified: Secondary | ICD-10-CM | POA: Diagnosis not present

## 2016-05-11 DIAGNOSIS — M1990 Unspecified osteoarthritis, unspecified site: Secondary | ICD-10-CM | POA: Diagnosis not present

## 2016-05-11 NOTE — Telephone Encounter (Signed)
Mary/ [facility] called wanting to know if Dr. Leonie Man will continue following the pt with PT/OT/ST. Will he sign orders or do they need to ask pcp. Please call [phone]

## 2016-05-11 NOTE — Telephone Encounter (Signed)
Rn call Mary at [facility] to give PT, OT, ST orders to  Continue. Pt was just d/c from [facility] on yesterday. Rn stated per pts d/c and Dr. Leonie Man therapy is to continue. Mary verbalized understanding.

## 2016-05-11 NOTE — Telephone Encounter (Signed)
[facility] Primary Care Navigator was calling b/c pt was discharged on 05/10/16 and wanted to know why TOC from our office was not done.  Pt was discharged with homehealth ([facility]) and new Rx Eliquis.  Edwena Felty would like to have a call back  [phone].

## 2016-05-12 ENCOUNTER — Telehealth: Payer: Self-pay | Admitting: *Deleted

## 2016-05-12 DIAGNOSIS — M1990 Unspecified osteoarthritis, unspecified site: Secondary | ICD-10-CM | POA: Diagnosis not present

## 2016-05-12 DIAGNOSIS — I69391 Dysphagia following cerebral infarction: Secondary | ICD-10-CM | POA: Diagnosis not present

## 2016-05-12 DIAGNOSIS — I69354 Hemiplegia and hemiparesis following cerebral infarction affecting left non-dominant side: Secondary | ICD-10-CM | POA: Diagnosis not present

## 2016-05-12 DIAGNOSIS — D649 Anemia, unspecified: Secondary | ICD-10-CM | POA: Diagnosis not present

## 2016-05-12 DIAGNOSIS — R1310 Dysphagia, unspecified: Secondary | ICD-10-CM | POA: Diagnosis not present

## 2016-05-12 DIAGNOSIS — I4891 Unspecified atrial fibrillation: Secondary | ICD-10-CM | POA: Diagnosis not present

## 2016-05-12 NOTE — Telephone Encounter (Signed)
Transition Care Management Follow-up Telephone Call  How have you been since you were released from the hospital? Improving. Working on swallowing; on nectar-thick diet    Do you understand why you were in the hospital? Yes   Do you understand the discharge instrcutions? Yes. Eliquis regimens have been initiated and appointment with Neuro has been scheduled out to October.   Items Reviewed:  Medications reviewed: Yes   Allergies reviewed:Yes  Dietary changes reviewed: Yes  Referrals reviewed: Yes   Functional Questionnaire:   Activities of Daily Living (ADLs):   He states they are independent in the following: supervision in shaving, walking to bathroom States they require assistance with the following: walking around long strides and bathing   Any transportation issues/concerns?: No   Any patient concerns? "When can I go back to work?"   Confirmed importance and date/time of follow-up visits scheduled: Follow up scheduled at 3:15pm on Monday, May 15, 2016 with PCP. Confirmed importance of appointment and daughter and wife both agree to confirm he makes appointment.    Confirmed with patient if condition begins to worsen call PCP or go to the ER.  Patient was given the Call-a-Nurse line [phone]: Yes

## 2016-05-12 NOTE — Telephone Encounter (Signed)
Noted  

## 2016-05-15 ENCOUNTER — Ambulatory Visit (INDEPENDENT_AMBULATORY_CARE_PROVIDER_SITE_OTHER): Payer: Medicare Other | Admitting: Internal Medicine

## 2016-05-15 ENCOUNTER — Encounter: Payer: Self-pay | Admitting: Internal Medicine

## 2016-05-15 ENCOUNTER — Other Ambulatory Visit: Payer: Self-pay | Admitting: *Deleted

## 2016-05-15 VITALS — BP 130/80 | HR 88 | Temp 97.2°F | Resp 18 | Ht 67.0 in | Wt 161.0 lb

## 2016-05-15 DIAGNOSIS — I10 Essential (primary) hypertension: Secondary | ICD-10-CM

## 2016-05-15 DIAGNOSIS — Z5189 Encounter for other specified aftercare: Secondary | ICD-10-CM | POA: Diagnosis not present

## 2016-05-15 DIAGNOSIS — R1310 Dysphagia, unspecified: Secondary | ICD-10-CM | POA: Diagnosis not present

## 2016-05-15 DIAGNOSIS — M1990 Unspecified osteoarthritis, unspecified site: Secondary | ICD-10-CM | POA: Diagnosis not present

## 2016-05-15 DIAGNOSIS — I480 Paroxysmal atrial fibrillation: Secondary | ICD-10-CM | POA: Diagnosis not present

## 2016-05-15 DIAGNOSIS — I69354 Hemiplegia and hemiparesis following cerebral infarction affecting left non-dominant side: Secondary | ICD-10-CM | POA: Diagnosis not present

## 2016-05-15 DIAGNOSIS — D649 Anemia, unspecified: Secondary | ICD-10-CM | POA: Diagnosis not present

## 2016-05-15 DIAGNOSIS — I639 Cerebral infarction, unspecified: Secondary | ICD-10-CM

## 2016-05-15 DIAGNOSIS — I4891 Unspecified atrial fibrillation: Secondary | ICD-10-CM | POA: Diagnosis not present

## 2016-05-15 DIAGNOSIS — I69391 Dysphagia following cerebral infarction: Secondary | ICD-10-CM | POA: Diagnosis not present

## 2016-05-15 DIAGNOSIS — I63311 Cerebral infarction due to thrombosis of right middle cerebral artery: Secondary | ICD-10-CM | POA: Diagnosis not present

## 2016-05-15 MED ORDER — DOXYCYCLINE HYCLATE 100 MG PO TABS: 100.0000 mg | ORAL_TABLET | Freq: Two times a day (BID) | ORAL | 3 refills | Status: DC

## 2016-05-15 NOTE — Progress Notes (Signed)
Subjective:    Patient ID: Brian [name], male    DOB: [birth year], 82 y.o.   MRN: [id number]  HPI  Date of Admission: 05/04/2016 Date of Discharge: 05/10/2016  Attending Physician:  Garvin Fila, MD, Stroke MD Consulting Physician(s):   Treatment Team:  Md Stroke, MD pulmonary/intensive care and rehabilitation medicine  Patient's PCP:  Nyoka Cowden, MD  DISCHARGE DIAGNOSIS: Right BG infarct with petechiae MRI transformation embolic due to right MCA occlusion from A. Fib s/p mech embolectomy and recanalization.  82 year old patient who was discharged 5 days ago following a right brain stroke.  He declined inpatient rehabilitation but is receiving speech therapy.  OT and PT at home.  He is using a walker.  He does have a daughter and son-in-law who assists with his care. He requires assistance to shower and dress.  He feels that he is making progress since his discharge  5 days ago.  He is accompanied by a daughter today.  Hospital records reviewed  The patient is now on Eliquis.  Antihypertensive medications are on hold but remains on metoprolol for rate control.  Past Medical History:  Diagnosis Date  . Anemia   . Atrial flutter ([place])   . BPH (benign prostatic hypertrophy)   . Epistaxis   . Hemorrhage of gastrointestinal tract, unspecified   . Hypertension   . Osteoarthrosis, unspecified whether generalized or localized, unspecified site   . Personal history of venous thrombosis and embolism   . Rosacea   . TIA (transient ischemic attack)      Social History   Social History  . Marital status: Married    Spouse name: N/A  . Number of children: N/A  . Years of education: N/A   Occupational History  . Not on file.   Social History Main Topics  . Smoking status: Former Research scientist (life sciences)  . Smokeless tobacco: Never Used     Comment: quit 50 yr ago  . Alcohol use No  . Drug use: No  . Sexual activity: Not on file   Other Topics Concern  . Not on file    Social History Narrative  . No narrative on file    Past Surgical History:  Procedure Laterality Date  . APPENDECTOMY    . HERNIA REPAIR    . IR GENERIC HISTORICAL  05/04/2016   IR PERCUTANEOUS ART THROMBECTOMY/INFUSION INTRACRANIAL INC DIAG ANGIO 05/04/2016 [name], MD [facility]  . IR GENERIC HISTORICAL  05/04/2016   IR ANGIO VERTEBRAL SEL SUBCLAVIAN INNOMINATE UNI R MOD SED 05/04/2016 [name], MD [facility]  . JOINT REPLACEMENT    . MITRAL VALVE REPAIR    . mvp repair    . RADIOLOGY WITH ANESTHESIA N/A 05/04/2016   Procedure: RADIOLOGY WITH ANESTHESIA;  Surgeon: [name], MD;  Location: [facility];  Service: Radiology;  Laterality: N/A;  . TOTAL HIP ARTHROPLASTY      Family History  Problem Relation Age of Onset  . Mitral valve prolapse Mother   . Coronary artery disease Father     Allergies  Allergen Reactions  . Novocain [Procaine Hcl]     Irregular heart beat     . Penicillins     REACTION: itching    Current Outpatient Prescriptions on File Prior to Visit  Medication Sig Dispense Refill  . ALPRAZolam (XANAX) 0.25 MG tablet Take 1 tablet (0.25 mg total) by mouth at bedtime. 90 tablet 1  . apixaban (ELIQUIS) 2.5 MG TABS tablet Take 1 tablet (2.5 mg  total) by mouth 2 (two) times daily. 60 tablet 4  . clindamycin (CLEOCIN) 150 MG capsule TAKE 2 CAPSULES BY MOUTH PRIOR TO DENTAL WORK AS DIRECTED 2 capsule 2  . finasteride (PROSCAR) 5 MG tablet Take 1 tablet (5 mg total) by mouth daily. 90 tablet 3  . Maltodextrin-Xanthan Gum (RESOURCE THICKENUP CLEAR) POWD Nectar thick liquids only until cleared by speech therapist. Use Thicken up to thicken liquids. 1 Can 11  . metoprolol succinate (TOPROL-XL) 25 MG 24 hr tablet TAKE 1 TABLET BY MOUTH EVERY MORNING AND 2 TABLETS BY MOUTH EVERY EVENING 270 tablet 3  . pantoprazole (PROTONIX) 40 MG tablet Take 1 tablet (40 mg total) by mouth daily. 90 tablet 3  . PROCTOZONE-HC 2.5 % rectal cream USE RECTALLY  TWICE DAILY 30 g 0  . saw palmetto 160 MG capsule Take 1 capsule (160 mg total) by mouth 2 (two) times daily.     No current facility-administered medications on file prior to visit.     BP 130/80 (BP Location: Left Arm, Patient Position: Sitting, Cuff Size: Normal)   Pulse 88   Temp 97.2 F (36.2 C) (Oral)   Resp 18   Ht 5\' 7"  (1.702 m)   Wt 161 lb (73 kg)   SpO2 98%   BMI 25.22 kg/m     Review of Systems  Constitutional: Positive for fatigue. Negative for appetite change, chills and fever.  HENT: Positive for trouble swallowing and voice change. Negative for congestion, dental problem, ear pain, hearing loss, sore throat and tinnitus.   Eyes: Negative for pain, discharge and visual disturbance.  Respiratory: Negative for cough, chest tightness, wheezing and stridor.   Cardiovascular: Negative for chest pain, palpitations and leg swelling.  Gastrointestinal: Negative for abdominal distention, abdominal pain, blood in stool, constipation, diarrhea, nausea and vomiting.  Genitourinary: Negative for difficulty urinating, discharge, flank pain, genital sores, hematuria and urgency.  Musculoskeletal: Positive for gait problem. Negative for arthralgias, back pain, joint swelling, myalgias and neck stiffness.  Skin: Negative for rash.  Neurological: Positive for weakness. Negative for dizziness, syncope, speech difficulty, numbness and headaches.  Hematological: Negative for adenopathy. Does not bruise/bleed easily.  Psychiatric/Behavioral: Negative for behavioral problems and dysphoric mood. The patient is not nervous/anxious.        Objective:   Physical Exam  Constitutional: He is oriented to person, place, and time. He appears well-developed. No distress.  Blood pressure 130/80 Ambulatory with a walker  HENT:  Head: Normocephalic.  Right Ear: External ear normal.  Left Ear: External ear normal.  Eyes: Conjunctivae and EOM are normal.  Neck: Normal range of motion.    Cardiovascular: Normal rate and normal heart sounds.   Pulmonary/Chest: Breath sounds normal.  Abdominal: Bowel sounds are normal.  Musculoskeletal: Normal range of motion. He exhibits no edema or tenderness.  Neurological: He is alert and oriented to person, place, and time.  Left facial droop Left-sided weakness, arm affected greater than the leg  Psychiatric: He has a normal mood and affect. His behavior is normal.          Assessment & Plan:   Status post right embolic MCA stroke.  Status post mechanical embolectomy.  Improving left hemiparesis Atrial fibrillation.  Continue anticoagulation Essential hypertension, stable off medications.  We'll continue to observe  Continue speech therapy, OT and outpatient PT Neurology follow-up  Continue close home blood pressure monitoring Follow-up one month  KWIATKOWSKI,PETER Pilar Plate

## 2016-05-15 NOTE — Progress Notes (Signed)
Pre visit review using our clinic review tool, if applicable. No additional management support is needed unless otherwise documented below in the visit note. 

## 2016-05-15 NOTE — Patient Outreach (Signed)
Called and spoke to PCP's office Lucina Mellow) to notify of patient's discharge from [facility] for a first time stroke inorder to follow-up for transition of care.

## 2016-05-15 NOTE — Patient Instructions (Signed)
Limit your sodium (Salt) intake  Please check your blood pressure on a regular basis.  If it is consistently greater than 150/90, please make an office appointment.  Neurology follow-up as scheduled Orthopedic consultation  Continue speech therapy, OT and PT

## 2016-05-16 DIAGNOSIS — I69354 Hemiplegia and hemiparesis following cerebral infarction affecting left non-dominant side: Secondary | ICD-10-CM | POA: Diagnosis not present

## 2016-05-16 DIAGNOSIS — D649 Anemia, unspecified: Secondary | ICD-10-CM | POA: Diagnosis not present

## 2016-05-16 DIAGNOSIS — R1310 Dysphagia, unspecified: Secondary | ICD-10-CM | POA: Diagnosis not present

## 2016-05-16 DIAGNOSIS — I4891 Unspecified atrial fibrillation: Secondary | ICD-10-CM | POA: Diagnosis not present

## 2016-05-16 DIAGNOSIS — M1990 Unspecified osteoarthritis, unspecified site: Secondary | ICD-10-CM | POA: Diagnosis not present

## 2016-05-16 DIAGNOSIS — I69391 Dysphagia following cerebral infarction: Secondary | ICD-10-CM | POA: Diagnosis not present

## 2016-05-16 NOTE — Telephone Encounter (Signed)
Rn call Brian [name] about needing orders for home heatlh aid for bathing and showering,and Education officer, museum. Rn stated Dr.Sethi was out on vacation this week. Rn stated per Dr. Erlinda Hong gave the verbal order for social worker and home health aid. Brian [name] verbalized understanding.

## 2016-05-16 NOTE — Telephone Encounter (Signed)
Mary with [facility] is calling to get a verbal order for a Home Health Aid to assist the patient with showering and clean up.

## 2016-05-17 ENCOUNTER — Telehealth: Payer: Self-pay | Admitting: Internal Medicine

## 2016-05-17 DIAGNOSIS — I69354 Hemiplegia and hemiparesis following cerebral infarction affecting left non-dominant side: Secondary | ICD-10-CM | POA: Diagnosis not present

## 2016-05-17 DIAGNOSIS — M1990 Unspecified osteoarthritis, unspecified site: Secondary | ICD-10-CM | POA: Diagnosis not present

## 2016-05-17 DIAGNOSIS — R1310 Dysphagia, unspecified: Secondary | ICD-10-CM | POA: Diagnosis not present

## 2016-05-17 DIAGNOSIS — I4891 Unspecified atrial fibrillation: Secondary | ICD-10-CM | POA: Diagnosis not present

## 2016-05-17 DIAGNOSIS — I69391 Dysphagia following cerebral infarction: Secondary | ICD-10-CM | POA: Diagnosis not present

## 2016-05-17 DIAGNOSIS — D649 Anemia, unspecified: Secondary | ICD-10-CM | POA: Diagnosis not present

## 2016-05-17 NOTE — Telephone Encounter (Signed)
° °  Pt would like a call back to discuss a diuretic medicine . He said his ankles is swelling

## 2016-05-17 NOTE — Telephone Encounter (Signed)
Spoke to pt, told him Dr.K is out of the office till Friday. Pt verbalized understanding. Told pt will check with him on Friday about a diuretic and get back to him. Meanwhile please elevate legs as much as possible, drink water and watch salt intake. Pt verbalized understanding.

## 2016-05-18 DIAGNOSIS — I69354 Hemiplegia and hemiparesis following cerebral infarction affecting left non-dominant side: Secondary | ICD-10-CM | POA: Diagnosis not present

## 2016-05-18 DIAGNOSIS — M1990 Unspecified osteoarthritis, unspecified site: Secondary | ICD-10-CM | POA: Diagnosis not present

## 2016-05-18 DIAGNOSIS — R1310 Dysphagia, unspecified: Secondary | ICD-10-CM | POA: Diagnosis not present

## 2016-05-18 DIAGNOSIS — I69391 Dysphagia following cerebral infarction: Secondary | ICD-10-CM | POA: Diagnosis not present

## 2016-05-18 DIAGNOSIS — I4891 Unspecified atrial fibrillation: Secondary | ICD-10-CM | POA: Diagnosis not present

## 2016-05-18 DIAGNOSIS — D649 Anemia, unspecified: Secondary | ICD-10-CM | POA: Diagnosis not present

## 2016-05-19 DIAGNOSIS — I69354 Hemiplegia and hemiparesis following cerebral infarction affecting left non-dominant side: Secondary | ICD-10-CM | POA: Diagnosis not present

## 2016-05-19 DIAGNOSIS — R1310 Dysphagia, unspecified: Secondary | ICD-10-CM | POA: Diagnosis not present

## 2016-05-19 DIAGNOSIS — I69391 Dysphagia following cerebral infarction: Secondary | ICD-10-CM | POA: Diagnosis not present

## 2016-05-19 DIAGNOSIS — D649 Anemia, unspecified: Secondary | ICD-10-CM | POA: Diagnosis not present

## 2016-05-19 DIAGNOSIS — M1990 Unspecified osteoarthritis, unspecified site: Secondary | ICD-10-CM | POA: Diagnosis not present

## 2016-05-19 DIAGNOSIS — I4891 Unspecified atrial fibrillation: Secondary | ICD-10-CM | POA: Diagnosis not present

## 2016-05-19 NOTE — Telephone Encounter (Signed)
Dr.K, please see message about pt's edema and advise.

## 2016-05-19 NOTE — Telephone Encounter (Signed)
Called and discussed.  Patient will minimize salt intake and attempt to keep his legs elevated. We'll call if peripheral edema worsens Would like to hold off on antihypertensive yet (diuretic)  therapy at this time

## 2016-05-22 ENCOUNTER — Telehealth: Payer: Self-pay | Admitting: *Deleted

## 2016-05-22 ENCOUNTER — Telehealth: Payer: Self-pay | Admitting: Internal Medicine

## 2016-05-22 DIAGNOSIS — I69391 Dysphagia following cerebral infarction: Secondary | ICD-10-CM | POA: Diagnosis not present

## 2016-05-22 DIAGNOSIS — I4891 Unspecified atrial fibrillation: Secondary | ICD-10-CM | POA: Diagnosis not present

## 2016-05-22 DIAGNOSIS — R1310 Dysphagia, unspecified: Secondary | ICD-10-CM | POA: Diagnosis not present

## 2016-05-22 DIAGNOSIS — I69354 Hemiplegia and hemiparesis following cerebral infarction affecting left non-dominant side: Secondary | ICD-10-CM | POA: Diagnosis not present

## 2016-05-22 DIAGNOSIS — H918X3 Other specified hearing loss, bilateral: Secondary | ICD-10-CM

## 2016-05-22 DIAGNOSIS — D649 Anemia, unspecified: Secondary | ICD-10-CM | POA: Diagnosis not present

## 2016-05-22 DIAGNOSIS — M1990 Unspecified osteoarthritis, unspecified site: Secondary | ICD-10-CM | POA: Diagnosis not present

## 2016-05-22 NOTE — Telephone Encounter (Signed)
Pt's wife Clarise Cruz called stating pt needs a referral to see Audiology on Friday. Told her I will put order for referral in the system and our referral coordinator will send referral over. Clarise Cruz verbalized understanding. Order put in EPIC.

## 2016-05-22 NOTE — Telephone Encounter (Signed)
Patient Name: [name] DOB: 20-Mar-1933 Initial Comment Caller states her husband is scheduled for cortisone injections on knee and shoulders on Thursday. Husband is on "blood-thinner" and wants to know if there could be an interaction between cortisone and "blood-thinner". Nurse Assessment Nurse: Ronnald Ramp, RN, Miranda Date/Time (Eastern Time): 05/22/2016 9:44:52 AM Confirm and document reason for call. If symptomatic, describe symptoms. You must click the next button to save text entered. ---Caller states her husband is scheduled to have steroid injections in his shoulder and knee. He is on Eliquis and she is wanting to know if there are any interactions. Has the patient traveled out of the country within the last 30 days? ---Not Applicable Does the patient have any new or worsening symptoms? ---No Please document clinical information provided and list any resource used. ---No interactions noted per [email or web address]. Also suggested caller contact the orthopedic office that is doing the injections. She states she has but forgot what they told her. Encouraged caller to contact them again. Guidelines Guideline Title Affirmed Question Affirmed Notes Final Disposition User

## 2016-05-22 NOTE — Telephone Encounter (Signed)
Noted  

## 2016-05-23 ENCOUNTER — Telehealth: Payer: Self-pay | Admitting: Neurology

## 2016-05-23 DIAGNOSIS — I69391 Dysphagia following cerebral infarction: Secondary | ICD-10-CM | POA: Diagnosis not present

## 2016-05-23 DIAGNOSIS — I69354 Hemiplegia and hemiparesis following cerebral infarction affecting left non-dominant side: Secondary | ICD-10-CM | POA: Diagnosis not present

## 2016-05-23 DIAGNOSIS — D649 Anemia, unspecified: Secondary | ICD-10-CM | POA: Diagnosis not present

## 2016-05-23 DIAGNOSIS — I4891 Unspecified atrial fibrillation: Secondary | ICD-10-CM | POA: Diagnosis not present

## 2016-05-23 DIAGNOSIS — R1310 Dysphagia, unspecified: Secondary | ICD-10-CM | POA: Diagnosis not present

## 2016-05-23 DIAGNOSIS — M1990 Unspecified osteoarthritis, unspecified site: Secondary | ICD-10-CM | POA: Diagnosis not present

## 2016-05-23 NOTE — Telephone Encounter (Signed)
RN call Rod Holler at [facility] about the home health aid. Rod Holler stated the patient had a lot of the home health staff in and out of the house. Rod Holler stated the OT is more sufficient than the home health aid. Pt will be still getting PT, OT,and St.

## 2016-05-23 NOTE — Telephone Encounter (Signed)
Brian [name], OT/ [facility]- cancelled the Home health aids for pt, and OT is going to help with the self care skills. At the pts request. May call [phone]

## 2016-05-24 DIAGNOSIS — I4891 Unspecified atrial fibrillation: Secondary | ICD-10-CM | POA: Diagnosis not present

## 2016-05-24 DIAGNOSIS — R1310 Dysphagia, unspecified: Secondary | ICD-10-CM | POA: Diagnosis not present

## 2016-05-24 DIAGNOSIS — I69354 Hemiplegia and hemiparesis following cerebral infarction affecting left non-dominant side: Secondary | ICD-10-CM | POA: Diagnosis not present

## 2016-05-24 DIAGNOSIS — I69391 Dysphagia following cerebral infarction: Secondary | ICD-10-CM | POA: Diagnosis not present

## 2016-05-24 DIAGNOSIS — M1990 Unspecified osteoarthritis, unspecified site: Secondary | ICD-10-CM | POA: Diagnosis not present

## 2016-05-24 DIAGNOSIS — D649 Anemia, unspecified: Secondary | ICD-10-CM | POA: Diagnosis not present

## 2016-05-25 ENCOUNTER — Other Ambulatory Visit: Payer: Self-pay | Admitting: Internal Medicine

## 2016-05-25 DIAGNOSIS — M25561 Pain in right knee: Secondary | ICD-10-CM | POA: Diagnosis not present

## 2016-05-25 DIAGNOSIS — M25512 Pain in left shoulder: Secondary | ICD-10-CM | POA: Diagnosis not present

## 2016-05-25 DIAGNOSIS — M170 Bilateral primary osteoarthritis of knee: Secondary | ICD-10-CM | POA: Diagnosis not present

## 2016-05-25 DIAGNOSIS — M25562 Pain in left knee: Secondary | ICD-10-CM | POA: Diagnosis not present

## 2016-05-25 DIAGNOSIS — M1711 Unilateral primary osteoarthritis, right knee: Secondary | ICD-10-CM | POA: Diagnosis not present

## 2016-05-26 ENCOUNTER — Telehealth: Payer: Self-pay | Admitting: Gastroenterology

## 2016-05-26 ENCOUNTER — Encounter: Payer: Self-pay | Admitting: Internal Medicine

## 2016-05-26 DIAGNOSIS — H903 Sensorineural hearing loss, bilateral: Secondary | ICD-10-CM | POA: Diagnosis not present

## 2016-05-26 NOTE — Telephone Encounter (Signed)
Spoke with Brian [name] She will contact the PCP or [place] for an appointment if OTC cream does not improve the symptoms.

## 2016-05-27 ENCOUNTER — Other Ambulatory Visit: Payer: Self-pay | Admitting: Internal Medicine

## 2016-05-29 DIAGNOSIS — D649 Anemia, unspecified: Secondary | ICD-10-CM | POA: Diagnosis not present

## 2016-05-29 DIAGNOSIS — M1990 Unspecified osteoarthritis, unspecified site: Secondary | ICD-10-CM | POA: Diagnosis not present

## 2016-05-29 DIAGNOSIS — I4891 Unspecified atrial fibrillation: Secondary | ICD-10-CM | POA: Diagnosis not present

## 2016-05-29 DIAGNOSIS — R1310 Dysphagia, unspecified: Secondary | ICD-10-CM | POA: Diagnosis not present

## 2016-05-29 DIAGNOSIS — I69391 Dysphagia following cerebral infarction: Secondary | ICD-10-CM | POA: Diagnosis not present

## 2016-05-29 DIAGNOSIS — I69354 Hemiplegia and hemiparesis following cerebral infarction affecting left non-dominant side: Secondary | ICD-10-CM | POA: Diagnosis not present

## 2016-05-30 ENCOUNTER — Telehealth: Payer: Self-pay | Admitting: Internal Medicine

## 2016-05-30 DIAGNOSIS — M1990 Unspecified osteoarthritis, unspecified site: Secondary | ICD-10-CM | POA: Diagnosis not present

## 2016-05-30 DIAGNOSIS — R1310 Dysphagia, unspecified: Secondary | ICD-10-CM

## 2016-05-30 DIAGNOSIS — I69391 Dysphagia following cerebral infarction: Secondary | ICD-10-CM | POA: Diagnosis not present

## 2016-05-30 DIAGNOSIS — I69354 Hemiplegia and hemiparesis following cerebral infarction affecting left non-dominant side: Secondary | ICD-10-CM | POA: Diagnosis not present

## 2016-05-30 DIAGNOSIS — I4891 Unspecified atrial fibrillation: Secondary | ICD-10-CM | POA: Diagnosis not present

## 2016-05-30 DIAGNOSIS — D649 Anemia, unspecified: Secondary | ICD-10-CM | POA: Diagnosis not present

## 2016-05-30 NOTE — Telephone Encounter (Signed)
Called and spoke to Rondo, told her discussed pt with Dr.Hunter due to Dr.K out of the office. Verbal order given for Modified Barium Swallowing test and order put in EPIC. Ebony Hail verbalized understanding.

## 2016-05-30 NOTE — Telephone Encounter (Signed)
Allsion with [facility], speech therapy, would like an order for pt  to be scheduled for a modified barium swallowing test ASAP If we can call with a verbal, and put the order in epic, she will get scheduled. No need to send order to referral coordinator.

## 2016-05-30 NOTE — Telephone Encounter (Signed)
Error/njr °

## 2016-05-31 ENCOUNTER — Other Ambulatory Visit (HOSPITAL_COMMUNITY): Payer: Self-pay | Admitting: Family Medicine

## 2016-05-31 DIAGNOSIS — I69354 Hemiplegia and hemiparesis following cerebral infarction affecting left non-dominant side: Secondary | ICD-10-CM | POA: Diagnosis not present

## 2016-05-31 DIAGNOSIS — M1990 Unspecified osteoarthritis, unspecified site: Secondary | ICD-10-CM | POA: Diagnosis not present

## 2016-05-31 DIAGNOSIS — D649 Anemia, unspecified: Secondary | ICD-10-CM | POA: Diagnosis not present

## 2016-05-31 DIAGNOSIS — R1310 Dysphagia, unspecified: Secondary | ICD-10-CM | POA: Diagnosis not present

## 2016-05-31 DIAGNOSIS — I69391 Dysphagia following cerebral infarction: Secondary | ICD-10-CM | POA: Diagnosis not present

## 2016-05-31 DIAGNOSIS — R1319 Other dysphagia: Secondary | ICD-10-CM

## 2016-05-31 DIAGNOSIS — I4891 Unspecified atrial fibrillation: Secondary | ICD-10-CM | POA: Diagnosis not present

## 2016-06-01 ENCOUNTER — Telehealth: Payer: Self-pay | Admitting: Neurology

## 2016-06-01 DIAGNOSIS — M1990 Unspecified osteoarthritis, unspecified site: Secondary | ICD-10-CM | POA: Diagnosis not present

## 2016-06-01 DIAGNOSIS — D649 Anemia, unspecified: Secondary | ICD-10-CM | POA: Diagnosis not present

## 2016-06-01 DIAGNOSIS — R1310 Dysphagia, unspecified: Secondary | ICD-10-CM | POA: Diagnosis not present

## 2016-06-01 DIAGNOSIS — I4891 Unspecified atrial fibrillation: Secondary | ICD-10-CM | POA: Diagnosis not present

## 2016-06-01 DIAGNOSIS — I69391 Dysphagia following cerebral infarction: Secondary | ICD-10-CM | POA: Diagnosis not present

## 2016-06-01 DIAGNOSIS — I69354 Hemiplegia and hemiparesis following cerebral infarction affecting left non-dominant side: Secondary | ICD-10-CM | POA: Diagnosis not present

## 2016-06-01 NOTE — Telephone Encounter (Signed)
Rn call Ebony Hail at [facility] about [place] orders. Rn gave verbal order per Dr. Leonie Man for speech therapy for once per week for 2 weeks. Ebony Hail verbalized understanding.

## 2016-06-01 NOTE — Telephone Encounter (Signed)
Brian [name] ST/[facility] called to request verbal orders to continue ST to address swallow function for 1 x a week for 2 weeks. Swallow test scheduled on oct 19th and she will know more after that. [phone]

## 2016-06-05 ENCOUNTER — Telehealth: Payer: Self-pay | Admitting: Internal Medicine

## 2016-06-05 DIAGNOSIS — I69354 Hemiplegia and hemiparesis following cerebral infarction affecting left non-dominant side: Secondary | ICD-10-CM | POA: Diagnosis not present

## 2016-06-05 DIAGNOSIS — I69391 Dysphagia following cerebral infarction: Secondary | ICD-10-CM | POA: Diagnosis not present

## 2016-06-05 DIAGNOSIS — R1310 Dysphagia, unspecified: Secondary | ICD-10-CM | POA: Diagnosis not present

## 2016-06-05 DIAGNOSIS — I4891 Unspecified atrial fibrillation: Secondary | ICD-10-CM | POA: Diagnosis not present

## 2016-06-05 DIAGNOSIS — D649 Anemia, unspecified: Secondary | ICD-10-CM | POA: Diagnosis not present

## 2016-06-05 DIAGNOSIS — M1990 Unspecified osteoarthritis, unspecified site: Secondary | ICD-10-CM | POA: Diagnosis not present

## 2016-06-05 NOTE — Telephone Encounter (Signed)
Pt is having hemorrhoid issues and needs to see dr Raliegh Ip tues afternoon.  Only appt is the 4 pm on hold.  Is it ok to use?  Daughter driving up from [place] tomorrow to hopefully bring pt in.

## 2016-06-05 NOTE — Telephone Encounter (Signed)
Pt aware 4 pm appt on tues, 10/17

## 2016-06-05 NOTE — Telephone Encounter (Signed)
Yes, that is fine. Please schedule.

## 2016-06-06 ENCOUNTER — Encounter: Payer: Self-pay | Admitting: Internal Medicine

## 2016-06-06 ENCOUNTER — Ambulatory Visit (INDEPENDENT_AMBULATORY_CARE_PROVIDER_SITE_OTHER): Payer: Medicare Other | Admitting: Internal Medicine

## 2016-06-06 VITALS — BP 132/70 | HR 70 | Temp 98.5°F | Resp 20 | Ht 67.0 in | Wt 150.0 lb

## 2016-06-06 DIAGNOSIS — I639 Cerebral infarction, unspecified: Secondary | ICD-10-CM | POA: Diagnosis not present

## 2016-06-06 DIAGNOSIS — I6300 Cerebral infarction due to thrombosis of unspecified precerebral artery: Secondary | ICD-10-CM

## 2016-06-06 DIAGNOSIS — I10 Essential (primary) hypertension: Secondary | ICD-10-CM | POA: Diagnosis not present

## 2016-06-06 DIAGNOSIS — K6289 Other specified diseases of anus and rectum: Secondary | ICD-10-CM

## 2016-06-06 MED ORDER — ESCITALOPRAM OXALATE 10 MG PO TABS: 10.0000 mg | ORAL_TABLET | Freq: Every day | ORAL | 0 refills | Status: DC

## 2016-06-06 MED ORDER — HYDROCORTISONE ACE-PRAMOXINE 2.5-1 % RE CREA: 1.0000 "application " | TOPICAL_CREAM | Freq: Three times a day (TID) | RECTAL | 0 refills | Status: DC

## 2016-06-06 NOTE — Patient Instructions (Addendum)
Use a stool softener daily, such as Surfak or Colace  Gen. surgery follow-up  Return in one month for follow-up  Disposable Sitz Bath A disposable sitz bath is a plastic basin that fits over the toilet. A bag is hung above the toilet and is connected to a tube that opens into the disposable sitz bath. The bag is filled with warm water that can flow into the basin through the tube.  HOW TO USE A DISPOSABLE SITZ BATH 1. Close the clamp on the tubing before filling the bag with water. This is to prevent leakage. 2. Fill the sitz bath basin and the plastic bag with warm water. 3. Place the filled basin on the toilet with the seat raised. Make sure the overflow opening is facing toward the back of the toilet. 4. Hang the filled plastic bag overhead on a hook or towel rack close to the toilet. When the bag is unclamped, a steady stream of water will flow from the bag, through the tubing, and into the basin. 5. Attach the tubing to the opening on the basin. 6. Sit on the basin positioned on the toilet seat and release the clamp. This will allow warm water to flush the area around your genitals and anus (perineum). 7. Remain sitting on the basin for approximately 15 to 20 minutes. 8. Stand up and pat the perineum area dry. If needed, apply clean bandages (dressings) to the affected area. 9. Tip the basin into the toilet to remove any remaining water and flush the toilet. 10. Wash the basin with warm water and soap. Let it dry in the sink. 11. Store the basin and tubing in a clean, dry area. 12. Wash your hands with soap and water. SEEK MEDICAL CARE IF: You get worse instead of better. Stop the sitz baths if you get worse. MAKE SURE YOU:  Understand these instructions.  Will watch your condition.  Will get help right away if you are not doing well or get worse.   This information is not intended to replace advice given to you by your health care provider. Make sure you discuss any questions you  have with your health care provider.   Document Released: 02/06/2012 Document Revised: 05/01/2012 Document Reviewed: 02/06/2012 Elsevier Interactive Patient Education [employer].

## 2016-06-06 NOTE — Progress Notes (Signed)
Pre visit review using our clinic review tool, if applicable. No additional management support is needed unless otherwise documented below in the visit note. 

## 2016-06-07 ENCOUNTER — Telehealth: Payer: Self-pay | Admitting: Internal Medicine

## 2016-06-07 ENCOUNTER — Ambulatory Visit (HOSPITAL_COMMUNITY)
Admission: RE | Admit: 2016-06-07 | Discharge: 2016-06-07 | Disposition: A | Discharge status: Home / Self Care | Payer: Medicare Other | Source: Ambulatory Visit | Attending: Interventional Radiology | Admitting: Interventional Radiology

## 2016-06-07 ENCOUNTER — Ambulatory Visit (HOSPITAL_COMMUNITY)
Admission: RE | Admit: 2016-06-07 | Discharge: 2016-06-07 | Disposition: A | Discharge status: Home / Self Care | Payer: Medicare Other | Source: Ambulatory Visit | Attending: Family Medicine | Admitting: Family Medicine

## 2016-06-07 ENCOUNTER — Encounter: Payer: Self-pay | Admitting: Internal Medicine

## 2016-06-07 DIAGNOSIS — D649 Anemia, unspecified: Secondary | ICD-10-CM | POA: Diagnosis not present

## 2016-06-07 DIAGNOSIS — Z86718 Personal history of other venous thrombosis and embolism: Secondary | ICD-10-CM | POA: Diagnosis not present

## 2016-06-07 DIAGNOSIS — I4891 Unspecified atrial fibrillation: Secondary | ICD-10-CM | POA: Diagnosis not present

## 2016-06-07 DIAGNOSIS — N400 Enlarged prostate without lower urinary tract symptoms: Secondary | ICD-10-CM | POA: Diagnosis not present

## 2016-06-07 DIAGNOSIS — I6521 Occlusion and stenosis of right carotid artery: Secondary | ICD-10-CM | POA: Diagnosis not present

## 2016-06-07 DIAGNOSIS — R1319 Other dysphagia: Secondary | ICD-10-CM

## 2016-06-07 DIAGNOSIS — I2510 Atherosclerotic heart disease of native coronary artery without angina pectoris: Secondary | ICD-10-CM

## 2016-06-07 DIAGNOSIS — I63319 Cerebral infarction due to thrombosis of unspecified middle cerebral artery: Secondary | ICD-10-CM

## 2016-06-07 DIAGNOSIS — R1314 Dysphagia, pharyngoesophageal phase: Secondary | ICD-10-CM | POA: Diagnosis not present

## 2016-06-07 DIAGNOSIS — R29810 Facial weakness: Secondary | ICD-10-CM | POA: Insufficient documentation

## 2016-06-07 DIAGNOSIS — M170 Bilateral primary osteoarthritis of knee: Secondary | ICD-10-CM | POA: Diagnosis not present

## 2016-06-07 DIAGNOSIS — F419 Anxiety disorder, unspecified: Secondary | ICD-10-CM | POA: Diagnosis not present

## 2016-06-07 DIAGNOSIS — I69354 Hemiplegia and hemiparesis following cerebral infarction affecting left non-dominant side: Secondary | ICD-10-CM | POA: Diagnosis not present

## 2016-06-07 DIAGNOSIS — J320 Chronic maxillary sinusitis: Secondary | ICD-10-CM | POA: Diagnosis not present

## 2016-06-07 DIAGNOSIS — I10 Essential (primary) hypertension: Secondary | ICD-10-CM | POA: Diagnosis not present

## 2016-06-07 DIAGNOSIS — I4892 Unspecified atrial flutter: Secondary | ICD-10-CM | POA: Insufficient documentation

## 2016-06-07 DIAGNOSIS — M19012 Primary osteoarthritis, left shoulder: Secondary | ICD-10-CM | POA: Insufficient documentation

## 2016-06-07 DIAGNOSIS — K649 Unspecified hemorrhoids: Secondary | ICD-10-CM | POA: Diagnosis not present

## 2016-06-07 DIAGNOSIS — I639 Cerebral infarction, unspecified: Secondary | ICD-10-CM

## 2016-06-07 DIAGNOSIS — Z880 Allergy status to penicillin: Secondary | ICD-10-CM | POA: Diagnosis not present

## 2016-06-07 DIAGNOSIS — Z8673 Personal history of transient ischemic attack (TIA), and cerebral infarction without residual deficits: Secondary | ICD-10-CM | POA: Insufficient documentation

## 2016-06-07 DIAGNOSIS — I63411 Cerebral infarction due to embolism of right middle cerebral artery: Secondary | ICD-10-CM | POA: Diagnosis not present

## 2016-06-07 DIAGNOSIS — R1310 Dysphagia, unspecified: Secondary | ICD-10-CM | POA: Diagnosis not present

## 2016-06-07 DIAGNOSIS — Z48812 Encounter for surgical aftercare following surgery on the circulatory system: Secondary | ICD-10-CM | POA: Diagnosis not present

## 2016-06-07 DIAGNOSIS — I6300 Cerebral infarction due to thrombosis of unspecified precerebral artery: Secondary | ICD-10-CM

## 2016-06-07 DIAGNOSIS — I69391 Dysphagia following cerebral infarction: Secondary | ICD-10-CM | POA: Diagnosis not present

## 2016-06-07 DIAGNOSIS — I63311 Cerebral infarction due to thrombosis of right middle cerebral artery: Secondary | ICD-10-CM

## 2016-06-07 DIAGNOSIS — M1990 Unspecified osteoarthritis, unspecified site: Secondary | ICD-10-CM | POA: Diagnosis not present

## 2016-06-07 HISTORY — PX: IR GENERIC HISTORICAL: IMG1180011

## 2016-06-07 NOTE — Progress Notes (Signed)
Modified Barium Swallow Progress Note  Patient Details  Name: SANCHEZ PEEBLES MRN: [id number] Date of Birth: August 12, 1933  Today's Date: 06/07/2016  Modified Barium Swallow completed.  Full report located under Chart Review in the Imaging Section.  Brief recommendations include the following:  Clinical Impression  Pt presents with mild pharyngeal and cervical esophageal dysphagia. Reduced base of tongue retraction and a delay in swallow initiation to the valleculae for all consistencies was observed. He had a single instance of flash penetration given thin liquids; however no further penetration occurred during MBS. Residue remained in the valleculae after all consistencies and additionally in the pyriform sinuses during thin liquid trials. During pureed and regular solid trials pt's vallecular residue cleared best when cued for extra swallows. Given the barium pill, it remained in the valleculae after the swallow until an additional puree trial was administered. Recommend advancement to regular diet and thin liquids as tolerated. SLP provided education for mechanical soft food preparation given noted residue seen on MBS. Pt should f/u with primary SLP.   Swallow Evaluation Recommendations       SLP Diet Recommendations: Regular solids;Dysphagia 3 (Mech soft) solids;Thin liquid   Liquid Administration via: Cup;Straw   Medication Administration: Whole meds with puree   Supervision: Patient able to self feed;Intermittent supervision to cue for compensatory strategies   Compensations: Minimize environmental distractions;Slow rate;Small sips/bites;Other (Comment);Clear throat intermittently (additional swallow after solid bites)   Postural Changes: Remain semi-upright after after feeds/meals (Comment);Seated upright at 90 degrees   Oral Care Recommendations: Patient independent with oral care       Ezekiel Slocumb, Student SLP  Shela Leff 06/07/2016,3:53 PM

## 2016-06-07 NOTE — Telephone Encounter (Signed)
ok 

## 2016-06-07 NOTE — Progress Notes (Addendum)
Subjective:    Patient ID: Brian [name], male    DOB: June 14, 1933, 82 y.o.   MRN: [id number]  HPI 82 year old patient who has essential hypertension and is recovering from a recent stroke.  He presents today with a chief complaint of rectal pain.  He has been using Proctofoam HC with little benefit.  Rectal pain interferes with sleep and causing worsening anxiety and stress.  Denies any rectal bleeding. He has been using stool softeners intermittently and is having 1 or 2 soft bowel movements per day He also complains of shoulder pain that interferes with his ability to ambulate.  He has been seen by orthopedics recently.  Patient continues to receive physical therapy and continues to improve.  Activities are limited due to shoulder and other orthopedic concerns.  He has had a recent swallowing evaluation.  Patient continues to have dependence in aspects of daily living and would benefit from additional home physical therapy and home health care.     Past Medical History:  Diagnosis Date  . Anemia   . Atrial flutter ([place])   . BPH (benign prostatic hypertrophy)   . Epistaxis   . Hemorrhage of gastrointestinal tract, unspecified   . Hypertension   . Osteoarthrosis, unspecified whether generalized or localized, unspecified site   . Personal history of venous thrombosis and embolism   . Rosacea   . TIA (transient ischemic attack)      Social History   Social History  . Marital status: Married    Spouse name: N/A  . Number of children: N/A  . Years of education: N/A   Occupational History  . Not on file.   Social History Main Topics  . Smoking status: Former Research scientist (life sciences)  . Smokeless tobacco: Never Used     Comment: quit 50 yr ago  . Alcohol use No  . Drug use: No  . Sexual activity: Not on file   Other Topics Concern  . Not on file   Social History Narrative  . No narrative on file    Past Surgical History:  Procedure Laterality Date  . APPENDECTOMY    . HERNIA REPAIR     . IR GENERIC HISTORICAL  05/04/2016   IR PERCUTANEOUS ART THROMBECTOMY/INFUSION INTRACRANIAL INC DIAG ANGIO 05/04/2016 [name], MD [facility]  . IR GENERIC HISTORICAL  05/04/2016   IR ANGIO VERTEBRAL SEL SUBCLAVIAN INNOMINATE UNI R MOD SED 05/04/2016 [name], MD [facility]  . JOINT REPLACEMENT    . MITRAL VALVE REPAIR    . mvp repair    . RADIOLOGY WITH ANESTHESIA N/A 05/04/2016   Procedure: RADIOLOGY WITH ANESTHESIA;  Surgeon: [name], MD;  Location: [facility];  Service: Radiology;  Laterality: N/A;  . TOTAL HIP ARTHROPLASTY      Family History  Problem Relation Age of Onset  . Mitral valve prolapse Mother   . Coronary artery disease Father     Allergies  Allergen Reactions  . Novocain [Procaine Hcl]     Irregular heart beat     . Penicillins     REACTION: itching    Current Outpatient Prescriptions on File Prior to Visit  Medication Sig Dispense Refill  . ALPRAZolam (XANAX) 0.25 MG tablet Take 1 tablet (0.25 mg total) by mouth at bedtime. 90 tablet 1  . apixaban (ELIQUIS) 2.5 MG TABS tablet Take 1 tablet (2.5 mg total) by mouth 2 (two) times daily. 60 tablet 4  . clindamycin (CLEOCIN) 150 MG capsule TAKE 2 CAPSULES BY MOUTH  PRIOR TO DENTAL WORK AS DIRECTED 2 capsule 2  . doxycycline (VIBRA-TABS) 100 MG tablet Take 1 tablet (100 mg total) by mouth 2 (two) times daily. 180 tablet 3  . finasteride (PROSCAR) 5 MG tablet Take 1 tablet (5 mg total) by mouth daily. 90 tablet 3  . Maltodextrin-Xanthan Gum (RESOURCE THICKENUP CLEAR) POWD Nectar thick liquids only until cleared by speech therapist. Use Thicken up to thicken liquids. 1 Can 11  . metoprolol succinate (TOPROL-XL) 25 MG 24 hr tablet TAKE 1 TABLET BY MOUTH EVERY MORNING AND 2 TABLETS BY MOUTH EVERY EVENING 270 tablet 3  . pantoprazole (PROTONIX) 40 MG tablet Take 1 tablet (40 mg total) by mouth daily. 90 tablet 3  . saw palmetto 160 MG capsule Take 1 capsule (160 mg total) by mouth 2 (two) times  daily.     No current facility-administered medications on file prior to visit.     BP 132/70 (BP Location: Left Arm, Patient Position: Sitting, Cuff Size: Normal)   Pulse 70   Temp 98.5 F (36.9 C) (Oral)   Resp 20   Ht 5\' 7"  (1.702 m)   Wt 150 lb (68 kg)   SpO2 99%   BMI 23.49 kg/m      Review of Systems  Constitutional: Negative for appetite change, chills, fatigue and fever.  HENT: Negative for congestion, dental problem, ear pain, hearing loss, sore throat, tinnitus, trouble swallowing and voice change.   Eyes: Negative for pain, discharge and visual disturbance.  Respiratory: Negative for cough, chest tightness, wheezing and stridor.   Cardiovascular: Negative for chest pain, palpitations and leg swelling.  Gastrointestinal: Positive for rectal pain. Negative for abdominal distention, abdominal pain, blood in stool, constipation, diarrhea, nausea and vomiting.  Genitourinary: Negative for difficulty urinating, discharge, flank pain, genital sores, hematuria and urgency.  Musculoskeletal: Positive for arthralgias and gait problem. Negative for back pain, joint swelling, myalgias and neck stiffness.  Skin: Negative for rash.  Neurological: Negative for dizziness, syncope, speech difficulty, weakness, numbness and headaches.  Hematological: Negative for adenopathy. Does not bruise/bleed easily.  Psychiatric/Behavioral: Positive for sleep disturbance. Negative for behavioral problems and dysphoric mood. The patient is nervous/anxious.        Objective:   Physical Exam  Constitutional: He appears well-developed and well-nourished. No distress.  HENT:  Mouth/Throat: Oropharynx is clear and moist.  Cardiovascular: Normal rate and regular rhythm.   Pulmonary/Chest: Effort normal and breath sounds normal.  Abdominal: Soft. Bowel sounds are normal. He exhibits no distension. There is no tenderness. There is no rebound.  Genitourinary: Rectum normal.  Genitourinary Comments:  Perirectal examination was unremarkable Soft stool present in the rectal vault No obvious hemorrhoidal disease          Assessment & Plan:   Rectal pain.  Unclear etiology.  Will treat with the Analpram HC and try some sitz baths.  Will set up for general surgical evaluation and hopefully symptoms will resolved and appointment may be canceled Status post CVA with functional limitations.  Continue home physical therapy and home health care.  Consults requested  Essential hypertension, stable coronary artery disease, stable  Continue anticoagulation  Situational anxiety/insomnia.  Will place on SSRI therapy  Follow-up 6 weeks  KWIATKOWSKI,PETER Pilar Plate

## 2016-06-07 NOTE — Telephone Encounter (Signed)
Okay to send referral for Home Health?

## 2016-06-07 NOTE — Telephone Encounter (Signed)
Pt wife is requesting home health assistant due to stroke. Pt wife would like donna to return her call

## 2016-06-08 ENCOUNTER — Encounter (HOSPITAL_COMMUNITY): Payer: Self-pay | Admitting: Interventional Radiology

## 2016-06-08 ENCOUNTER — Telehealth: Payer: Self-pay

## 2016-06-08 ENCOUNTER — Ambulatory Visit (HOSPITAL_COMMUNITY): Payer: Medicare Other

## 2016-06-08 ENCOUNTER — Ambulatory Visit (HOSPITAL_COMMUNITY)
Admission: RE | Admit: 2016-06-08 | Discharge: 2016-06-08 | Disposition: A | Discharge status: Home / Self Care | Payer: Medicare Other | Source: Ambulatory Visit | Attending: Family Medicine | Admitting: Family Medicine

## 2016-06-08 ENCOUNTER — Other Ambulatory Visit (HOSPITAL_COMMUNITY): Payer: Medicare Other

## 2016-06-08 ENCOUNTER — Telehealth: Payer: Self-pay | Admitting: Internal Medicine

## 2016-06-08 NOTE — Telephone Encounter (Signed)
Received PA request from [place]. PA submitted & is pending. Key: [id number]

## 2016-06-08 NOTE — Telephone Encounter (Signed)
Pt needs new Rx for proctozone HC 2.5 cream   Pharm:  [facility]   Pt would like to also have the new cream for hemorrhoids.  Pt do not want to have the surgery done and would like to have it cancelled.

## 2016-06-08 NOTE — Telephone Encounter (Signed)
Spoke to pt's wife Clarise Cruz,  Asked her to talk to pt. Clarise Cruz said he is sleeping right now and her daughter is at the pharmacy now. Told her okay Rx was sent to pharmacy yesterday and if there is something else he needs just call me back. Clarise Cruz verbalized understanding. Also told her if he does not want surgery he will need to let surgery center know. Clarise Cruz verbalized understanding and said no one has called yet.

## 2016-06-09 ENCOUNTER — Ambulatory Visit (HOSPITAL_COMMUNITY): Admission: RE | Admit: 2016-06-09 | Payer: Medicare Other | Source: Ambulatory Visit

## 2016-06-09 DIAGNOSIS — I69391 Dysphagia following cerebral infarction: Secondary | ICD-10-CM | POA: Diagnosis not present

## 2016-06-09 DIAGNOSIS — I4891 Unspecified atrial fibrillation: Secondary | ICD-10-CM | POA: Diagnosis not present

## 2016-06-09 DIAGNOSIS — M1990 Unspecified osteoarthritis, unspecified site: Secondary | ICD-10-CM | POA: Diagnosis not present

## 2016-06-09 DIAGNOSIS — R1310 Dysphagia, unspecified: Secondary | ICD-10-CM | POA: Diagnosis not present

## 2016-06-09 DIAGNOSIS — I69354 Hemiplegia and hemiparesis following cerebral infarction affecting left non-dominant side: Secondary | ICD-10-CM | POA: Diagnosis not present

## 2016-06-09 DIAGNOSIS — D649 Anemia, unspecified: Secondary | ICD-10-CM | POA: Diagnosis not present

## 2016-06-12 ENCOUNTER — Telehealth: Payer: Self-pay | Admitting: Neurology

## 2016-06-12 ENCOUNTER — Telehealth: Payer: Self-pay | Admitting: Internal Medicine

## 2016-06-12 NOTE — Telephone Encounter (Signed)
Pts wife state that the sleeping pill does not work and when he gets up to go to the bathroom at night it takes the wife and mostly the  son to get him back into the bed at night and wanted to know if you suggest for pt to get pt Medicare home health to come out to assist in the home and with bathing, dressing and getting into the bed at night.  Pt 4-5 times a night and pts wife state that Medicare should help.  Pts wife would like to have a call back.

## 2016-06-12 NOTE — Telephone Encounter (Addendum)
Rn spoke with Rod Holler at [facility] about needing orders for PT and OT. Pt will be finishing up with [facility] this week. Rod Holler stated pt may decline it but she is not sure. Rod Holler stated they recommended outpatient therapy.

## 2016-06-12 NOTE — Telephone Encounter (Addendum)
Rod Holler [facility] [phone] called to advise patient will be discharging from [place] this week, requests order for outpatient PT and OT to [facility] next door.

## 2016-06-13 DIAGNOSIS — I69391 Dysphagia following cerebral infarction: Secondary | ICD-10-CM | POA: Diagnosis not present

## 2016-06-13 DIAGNOSIS — D649 Anemia, unspecified: Secondary | ICD-10-CM | POA: Diagnosis not present

## 2016-06-13 DIAGNOSIS — I69354 Hemiplegia and hemiparesis following cerebral infarction affecting left non-dominant side: Secondary | ICD-10-CM | POA: Diagnosis not present

## 2016-06-13 DIAGNOSIS — M1990 Unspecified osteoarthritis, unspecified site: Secondary | ICD-10-CM | POA: Diagnosis not present

## 2016-06-13 DIAGNOSIS — R1310 Dysphagia, unspecified: Secondary | ICD-10-CM | POA: Diagnosis not present

## 2016-06-13 DIAGNOSIS — I4891 Unspecified atrial fibrillation: Secondary | ICD-10-CM | POA: Diagnosis not present

## 2016-06-13 NOTE — Telephone Encounter (Signed)
Spoke to pt, told him I put order in for [facility] but pt was not understanding me so I asked him to get his wife. Spoke to Mrs Allsopp, told her referral for Home Health was done and someone will be contacting you about coming out to do an assessment on pt. Mrs. Stadtlander verbalized understanding and stated she wants help at night. Told her if she wants someone at night she will have to contact agency herself and that insurance will not cover help at night. Home Health will only come during the day to help with ADL's. Mrs. Hemme verbalized understanding.

## 2016-06-14 ENCOUNTER — Other Ambulatory Visit: Payer: Self-pay

## 2016-06-14 DIAGNOSIS — I63311 Cerebral infarction due to thrombosis of right middle cerebral artery: Secondary | ICD-10-CM

## 2016-06-14 NOTE — Telephone Encounter (Deleted)
dddd 

## 2016-06-14 NOTE — Telephone Encounter (Signed)
Orders put in for OT and PT for [facility] in [place].

## 2016-06-15 NOTE — Telephone Encounter (Signed)
Mary/[facility] [phone] called to advise order for PT has ran out, requests order for PT, 1 x week for 2 weeks.

## 2016-06-15 NOTE — Telephone Encounter (Signed)
Rn call [facility] about getting a PT order for 2 weeks. Rn stated a order was put in this week for outpatient per Rod Holler. Stanton Kidney stated the patient is trying to figure out how to get the patient to outpatient.She stated its a lot of psychosocial issues going on with the pt.Rn stated the referral was sent to [facility]. Also if the rehab calls pt he or the family can post pone until they are ready. Rn gave verbal order per Dr. Garrison Columbus 2 weeks of PT.

## 2016-06-19 ENCOUNTER — Telehealth: Payer: Self-pay | Admitting: Internal Medicine

## 2016-06-19 NOTE — Telephone Encounter (Signed)
Wife states pt having trouble getting out of the bed. They would like a trapeze bar that will attach or sits over the bed. Pt as a left bed/ Wife states medicare will pay if pt has a drs order.   In addition, wife has an eye appointment on wed afternoon. She would like to know if Dr Raliegh Ip thinks ok for her to go and leave pt alone.  from about 1:30 pm- 4 :30 pm at the latest.

## 2016-06-20 ENCOUNTER — Telehealth: Payer: Self-pay | Admitting: Internal Medicine

## 2016-06-20 DIAGNOSIS — I480 Paroxysmal atrial fibrillation: Secondary | ICD-10-CM

## 2016-06-20 DIAGNOSIS — M25512 Pain in left shoulder: Secondary | ICD-10-CM | POA: Diagnosis not present

## 2016-06-20 DIAGNOSIS — I10 Essential (primary) hypertension: Secondary | ICD-10-CM

## 2016-06-20 DIAGNOSIS — M25571 Pain in right ankle and joints of right foot: Secondary | ICD-10-CM | POA: Diagnosis not present

## 2016-06-20 DIAGNOSIS — M1711 Unilateral primary osteoarthritis, right knee: Secondary | ICD-10-CM | POA: Diagnosis not present

## 2016-06-20 DIAGNOSIS — I63319 Cerebral infarction due to thrombosis of unspecified middle cerebral artery: Secondary | ICD-10-CM

## 2016-06-20 DIAGNOSIS — I4892 Unspecified atrial flutter: Secondary | ICD-10-CM

## 2016-06-20 DIAGNOSIS — Z9889 Other specified postprocedural states: Secondary | ICD-10-CM

## 2016-06-20 DIAGNOSIS — M1712 Unilateral primary osteoarthritis, left knee: Secondary | ICD-10-CM | POA: Diagnosis not present

## 2016-06-20 NOTE — Telephone Encounter (Signed)
PA denied because the Social Security Act permits the exclusion of certain drugs or classes of drugs from coverage under Part D.

## 2016-06-20 NOTE — Telephone Encounter (Signed)
The patient needs a referral to a cardiologist

## 2016-06-20 NOTE — Telephone Encounter (Signed)
Okay to try over-the-counter Anusol cream or suppositories

## 2016-06-20 NOTE — Telephone Encounter (Signed)
Dr. Raliegh Ip, pt's rectal cream that you ordered was denied by insurance. Please advise.

## 2016-06-20 NOTE — Telephone Encounter (Signed)
Spoke to pt's wife Clarise Cruz, told her order for referral to Cardiology was done and someone will be contacting them to schedule an appt. Clarise Cruz verbalized understanding and will let pt know.

## 2016-06-20 NOTE — Telephone Encounter (Signed)
Spoke to pt's wife Clarise Cruz, asked her why pt needs Trapeze bar? Clarise Cruz said to get back in bed. Told her I don't think it will work unless you have a hospital bed. Clarise Cruz said pt has a lift bed and the therapist said it might help. Asked for PT name and number. Clarise Cruz told me it is Stanton Kidney and # is [phone]. Told her okay I will call Stanton Kidney and see what kind of Trapeze bar you need. Clarise Cruz verbalized understanding.

## 2016-06-20 NOTE — Telephone Encounter (Signed)
Mal Amabile PT with [facility] and spoke to her regarding pt and Trapeze bar. Stanton Kidney said she does not feel pt needs one and she does not know if it will work on his bed.with him and get back to me. Stanton Kidney said pt does fine getting in and out of bed when he does his exercises.  Stanton Kidney said she is seeing him tomorrow and will discuss with him and get back to me.

## 2016-06-20 NOTE — Telephone Encounter (Signed)
Spoke to pt's wife Clarise Cruz, told her insurance denied Rx for [facility] rectal cream. Dr.K said to try over-the-counter Anusol cream or suppositories. Clarise Cruz verbalized understanding and will let pt know.

## 2016-06-21 DIAGNOSIS — I69354 Hemiplegia and hemiparesis following cerebral infarction affecting left non-dominant side: Secondary | ICD-10-CM | POA: Diagnosis not present

## 2016-06-21 DIAGNOSIS — M1990 Unspecified osteoarthritis, unspecified site: Secondary | ICD-10-CM | POA: Diagnosis not present

## 2016-06-21 DIAGNOSIS — I69391 Dysphagia following cerebral infarction: Secondary | ICD-10-CM | POA: Diagnosis not present

## 2016-06-21 DIAGNOSIS — D649 Anemia, unspecified: Secondary | ICD-10-CM | POA: Diagnosis not present

## 2016-06-21 DIAGNOSIS — I4891 Unspecified atrial fibrillation: Secondary | ICD-10-CM | POA: Diagnosis not present

## 2016-06-21 DIAGNOSIS — R1310 Dysphagia, unspecified: Secondary | ICD-10-CM | POA: Diagnosis not present

## 2016-06-22 ENCOUNTER — Encounter: Payer: Self-pay | Admitting: *Deleted

## 2016-06-27 DIAGNOSIS — I69354 Hemiplegia and hemiparesis following cerebral infarction affecting left non-dominant side: Secondary | ICD-10-CM | POA: Diagnosis not present

## 2016-06-27 DIAGNOSIS — R1310 Dysphagia, unspecified: Secondary | ICD-10-CM | POA: Diagnosis not present

## 2016-06-27 DIAGNOSIS — D649 Anemia, unspecified: Secondary | ICD-10-CM | POA: Diagnosis not present

## 2016-06-27 DIAGNOSIS — I4891 Unspecified atrial fibrillation: Secondary | ICD-10-CM | POA: Diagnosis not present

## 2016-06-27 DIAGNOSIS — I69391 Dysphagia following cerebral infarction: Secondary | ICD-10-CM | POA: Diagnosis not present

## 2016-06-27 DIAGNOSIS — M1990 Unspecified osteoarthritis, unspecified site: Secondary | ICD-10-CM | POA: Diagnosis not present

## 2016-06-28 NOTE — Telephone Encounter (Signed)
Mary/[facility] [phone] called regarding patient continuing PT, would like to continue PT at home.

## 2016-06-28 NOTE — Telephone Encounter (Signed)
Rn receive call from [place] at [facility]. Stanton Kidney stated the pt has a full time caregiver and pt is doing wonderful. Rn stated several calls were made about outpatient therapy for patient because he was not motivated. Stanton Kidney stated they dont need outpatient now. Rn gave order to continue in home therapy for PT, and OT per Dr. Leonie Man. Rn gave therapist number at neuro [facility]hab on [phone] so pt or family can call to cancel outpatient therapy order. Mary verbalized understanding.

## 2016-07-04 ENCOUNTER — Encounter: Payer: Self-pay | Admitting: Cardiovascular Disease

## 2016-07-04 ENCOUNTER — Ambulatory Visit (INDEPENDENT_AMBULATORY_CARE_PROVIDER_SITE_OTHER): Payer: Medicare Other | Admitting: Cardiovascular Disease

## 2016-07-04 VITALS — BP 107/60 | HR 76 | Ht 67.0 in | Wt 146.2 lb

## 2016-07-04 DIAGNOSIS — I119 Hypertensive heart disease without heart failure: Secondary | ICD-10-CM

## 2016-07-04 DIAGNOSIS — Z9889 Other specified postprocedural states: Secondary | ICD-10-CM | POA: Diagnosis not present

## 2016-07-04 DIAGNOSIS — I639 Cerebral infarction, unspecified: Secondary | ICD-10-CM

## 2016-07-04 DIAGNOSIS — I63411 Cerebral infarction due to embolism of right middle cerebral artery: Secondary | ICD-10-CM

## 2016-07-04 DIAGNOSIS — Z7901 Long term (current) use of anticoagulants: Secondary | ICD-10-CM

## 2016-07-04 DIAGNOSIS — I480 Paroxysmal atrial fibrillation: Secondary | ICD-10-CM | POA: Diagnosis not present

## 2016-07-04 NOTE — Patient Instructions (Signed)
Medication Instructions:  Your physician recommends that you continue on your current medications as directed. Please refer to the Current Medication list given to you today.  Labwork: none  Testing/Procedures: none  Follow-Up: Your physician wants you to follow-up in: 6 month ov You will receive a reminder letter in the mail two months in advance. If you don't receive a letter, please call our office to schedule the follow-up appointment.    

## 2016-07-04 NOTE — Progress Notes (Signed)
Cardiology Office Note   Date:  07/06/2016   ID:  Brian [name], DOB 1933-05-15, MRN [id number]  PCP:  Brian Cowden, MD  Cardiologist:   Skeet Latch, MD   Chief Complaint  Patient presents with  . New Patient (Initial Visit)     History of Present Illness: Brian [name] is a 82 y.o. male with paroxysmal atrial fibrillation, GI bleeds, prior stroke, hypertension, prior mitral vale repair who presents to establish care.  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  He was found down with left hemiplegia.  He underwent embolectomy. INR was 2.8 on admission.  He was switched to Eliquis 2.5 mg bid prior to discharge. Discharge she has been feeling well. He notes some persistent weakness in his arms and legs. He is working with physical therapy once per week. He wants to know if he is able to go back to work at this point. He did have some swelling in his ankles 2 weeks ago but started wearing compression stockings which has helped. He denies orthopnea, PND, shortness of breath, or chest pain.  Brian [name] had a gastric ulcer in 2002 and required transfusion at that time. This was subsequently treated and he has not had any recurrent GI bleeding.  He has lost a lot of weight after the stroke. He was initially on thickened liquids and did not like these very much. However he has subsequently been cleared to have thin liquids and is slowly gaining weight.   Mr. had his mitral valve repaired at [facility] in 2002. [place] He had an echo 05/06/16 that revealed LVEF 55-60% and mildly elevated pulmonary pressures.  He had a [employer] 01/2004 that revealed LVEF 64% and a nonreversible inferior defect that was thought to be diaphragm attenuation versus infarct. He had an echo 10/2009 with normal systolic function, mild aortic regurgitation and mild mitral regurgitation.  He was last seen by Dr. Stanford Breed 01/2013  at which time he was felt to be in atrial flutter. He was well rate-controlled and was asymptomatic. He was anticoagulated with Coumadin.   Past Medical History:  Diagnosis Date  . Anemia   . Atrial flutter ([place])   . BPH (benign prostatic hypertrophy)   . Epistaxis   . Hemorrhage of gastrointestinal tract, unspecified   . Hypertension   . Osteoarthrosis, unspecified whether generalized or localized, unspecified site   . Personal history of venous thrombosis and embolism   . Rosacea   . TIA (transient ischemic attack)     Past Surgical History:  Procedure Laterality Date  . APPENDECTOMY    . HERNIA REPAIR    . IR GENERIC HISTORICAL  05/04/2016   IR PERCUTANEOUS ART THROMBECTOMY/INFUSION INTRACRANIAL INC DIAG ANGIO 05/04/2016 [name], MD [facility]  . IR GENERIC HISTORICAL  05/04/2016   IR ANGIO VERTEBRAL SEL SUBCLAVIAN INNOMINATE UNI R MOD SED 05/04/2016 [name], MD [facility]  . IR GENERIC HISTORICAL  06/07/2016   IR RADIOLOGIST EVAL & MGMT 06/07/2016 [facility]  . JOINT REPLACEMENT    . MITRAL VALVE REPAIR    . mvp repair    . RADIOLOGY WITH ANESTHESIA N/A 05/04/2016   Procedure: RADIOLOGY WITH ANESTHESIA;  Surgeon: [name], MD;  Location: [place];  Service: Radiology;  Laterality: N/A;  . TOTAL HIP ARTHROPLASTY       Current Outpatient Prescriptions  Medication Sig Dispense Refill  . ALPRAZolam (XANAX) 0.25  MG tablet Take 1 tablet (0.25 mg total) by mouth at bedtime. 90 tablet 1  . apixaban (ELIQUIS) 2.5 MG TABS tablet Take 1 tablet (2.5 mg total) by mouth 2 (two) times daily. 60 tablet 4  . clindamycin (CLEOCIN) 150 MG capsule TAKE 2 CAPSULES BY MOUTH PRIOR TO DENTAL WORK AS DIRECTED 2 capsule 2  . doxycycline (VIBRA-TABS) 100 MG tablet Take 1 tablet (100 mg total) by mouth 2 (two) times daily. 180 tablet 3  . escitalopram (LEXAPRO) 10 MG tablet Take 1 tablet (10 mg total) by mouth daily. 60 tablet 0  . finasteride (PROSCAR) 5 MG tablet  Take 1 tablet (5 mg total) by mouth daily. 90 tablet 3  . hydrocortisone-pramoxine (ANALPRAM-HC) 2.5-1 % rectal cream Place 1 application rectally 3 (three) times daily. 30 g 0  . Maltodextrin-Xanthan Gum (RESOURCE THICKENUP CLEAR) POWD Nectar thick liquids only until cleared by speech therapist. Use Thicken up to thicken liquids. 1 Can 11  . metoprolol succinate (TOPROL-XL) 25 MG 24 hr tablet TAKE 1 TABLET BY MOUTH EVERY MORNING AND 2 TABLETS BY MOUTH EVERY EVENING 270 tablet 3  . pantoprazole (PROTONIX) 40 MG tablet Take 1 tablet (40 mg total) by mouth daily. 90 tablet 3  . saw palmetto 160 MG capsule Take 1 capsule (160 mg total) by mouth 2 (two) times daily.     No current facility-administered medications for this visit.     Allergies:   Novocain [procaine hcl] and Penicillins    Social History:  The patient  reports that he has quit smoking. He has never used smokeless tobacco. He reports that he does not drink alcohol or use drugs.   Family History:  The patient's family history includes Coronary artery disease in his father; Rheumatic fever in his mother.    ROS:  Please see the history of present illness.   Otherwise, review of systems are positive for none.   All other systems are reviewed and negative.    PHYSICAL EXAM: VS:  BP 107/60   Pulse 76   Ht 5\' 7"  (1.702 m)   Wt 66.3 kg (146 lb 3.2 oz)   BMI 22.90 kg/m  , BMI Body mass index is 22.9 kg/m. GENERAL:  Well appearing HEENT:  Pupils equal round and reactive, fundi not visualized, oral mucosa unremarkable NECK:  No jugular venous distention, waveform within normal limits, carotid upstroke brisk and symmetric, no bruits, no thyromegaly LYMPHATICS:  No cervical adenopathy LUNGS:  Clear to auscultation bilaterally HEART:  Irregularly irregular.  PMI not displaced or sustained,S1 and S2 within normal limits, no S3, no S4, no clicks, no rubs, III/VI holosystolic murmur at the apex ABD:  Flat, positive bowel sounds normal  in frequency in pitch, no bruits, no rebound, no guarding, no midline pulsatile mass, no hepatomegaly, no splenomegaly EXT:  2 plus pulses throughout, no edema, no cyanosis no clubbing SKIN:  No rashes no nodules NEURO:  Cranial nerves II through XII grossly intact, motor grossly intact throughout PSYCH:  Cognitively intact, oriented to person place and time    EKG:  EKG is ordered today. The ekg ordered today demonstrates atrial fibrillation. Rate 76 bpm. Left anterior fascicular block. Left ventricular hypertrophy with repolarization in about these.  Echo 05/06/16: Study Conclusions  - Left ventricle: The cavity size was normal. Wall thickness was   normal. Systolic function was normal. The estimated ejection   fraction was in the range of 55% to 60%. Wall motion was normal;   there were  no regional wall motion abnormalities. Mitral   annuloplasty precludes evaluation of LV diastolic function. - Ventricular septum: Septal motion showed paradox. These changes   are consistent with a post-thoracotomy state. - Mitral valve: Prior procedures included surgical repair. An   annular ring prosthesis was present and functioning normally.   Valve area by pressure half-time: 2.47 cm^2. - Left atrium: The atrium was mildly dilated. - Pulmonary arteries: Systolic pressure was mildly increased. PA   peak pressure: 44 mm Hg (S).  Recent Labs: 09/22/2015: TSH 2.58 05/04/2016: ALT 29 05/06/2016: Hemoglobin 10.8; Platelets 124 05/07/2016: BUN 10; Creatinine, Ser 0.71; Magnesium 1.8; Potassium 3.3; Sodium 144    Lipid Panel    Component Value Date/Time   CHOL 114 05/05/2016 0500   TRIG 54 05/05/2016 0500   HDL 41 05/05/2016 0500   CHOLHDL 2.8 05/05/2016 0500   VLDL 11 05/05/2016 0500   LDLCALC 62 05/05/2016 0500   LDLDIRECT 80.6 05/27/2009 0857      Wt Readings from Last 3 Encounters:  07/04/16 66.3 kg (146 lb 3.2 oz)  06/06/16 68 kg (150 lb)  05/15/16 73 kg (161 lb)      ASSESSMENT  AND PLAN:  # Persistent atrial fibrillation:  # Prior embolic stroke/hemorrhagic: Rates are well controlled on metoprolol and he is asymptomatic. He was started on low-dose Eliquis, presumably because there was intracerebral hemorrhage with his stroke. He has underdosed for his weight and renal function. Given that he had a stroke while his INR was therapeutic on Coumadin, this puts him at high risk for recurrent embolic stroke. This will likely be increased by the time he sees neurology at the end of the month. He does have a history of GI bleed, but none recently. This patients CHA2DS2-VASc Score and unadjusted Ischemic Stroke Rate (% per year) is equal to 7.2 % stroke rate/year from a score of 5 Above score calculated as 1 point each if present [CHF, HTN, DM, Vascular=MI/PAD/Aortic Plaque, Age if 65-74, or Male] Above score calculated as 2 points each if present [Age > 75, or Stroke/TIA/TE]  # Hypertension: Blood pressure is well-controlled on metoprolol.  # Mitral valve repair: Valve stable on echo 04/2016.    Current medicines are reviewed at length with the patient today.  The patient does not have concerns regarding medicines.  The following changes have been made:  no change  Labs/ tests ordered today include:   Orders Placed This Encounter  Procedures  . EKG 12-Lead   Time spent: 45 minutes-Greater than 50% of this time was spent in counseling, explanation of diagnosis, planning of further management, and coordination of care.   Disposition:   FU with Avyonna Wagoner C. Oval Linsey, MD, [facility] in 3 months    This note was written with the assistance of speech recognition software.  Please excuse any transcriptional errors.  Signed, Shaylin Blatt C. Oval Linsey, MD, [facility]  07/06/2016 11:16 AM    [place]

## 2016-07-05 DIAGNOSIS — M1990 Unspecified osteoarthritis, unspecified site: Secondary | ICD-10-CM | POA: Diagnosis not present

## 2016-07-05 DIAGNOSIS — I69391 Dysphagia following cerebral infarction: Secondary | ICD-10-CM | POA: Diagnosis not present

## 2016-07-05 DIAGNOSIS — D649 Anemia, unspecified: Secondary | ICD-10-CM | POA: Diagnosis not present

## 2016-07-05 DIAGNOSIS — R1310 Dysphagia, unspecified: Secondary | ICD-10-CM | POA: Diagnosis not present

## 2016-07-05 DIAGNOSIS — I69354 Hemiplegia and hemiparesis following cerebral infarction affecting left non-dominant side: Secondary | ICD-10-CM | POA: Diagnosis not present

## 2016-07-05 DIAGNOSIS — I4891 Unspecified atrial fibrillation: Secondary | ICD-10-CM | POA: Diagnosis not present

## 2016-07-07 DIAGNOSIS — M1990 Unspecified osteoarthritis, unspecified site: Secondary | ICD-10-CM | POA: Diagnosis not present

## 2016-07-07 DIAGNOSIS — I69354 Hemiplegia and hemiparesis following cerebral infarction affecting left non-dominant side: Secondary | ICD-10-CM | POA: Diagnosis not present

## 2016-07-07 DIAGNOSIS — I69391 Dysphagia following cerebral infarction: Secondary | ICD-10-CM | POA: Diagnosis not present

## 2016-07-07 DIAGNOSIS — D649 Anemia, unspecified: Secondary | ICD-10-CM | POA: Diagnosis not present

## 2016-07-07 DIAGNOSIS — I4891 Unspecified atrial fibrillation: Secondary | ICD-10-CM | POA: Diagnosis not present

## 2016-07-07 DIAGNOSIS — R1310 Dysphagia, unspecified: Secondary | ICD-10-CM | POA: Diagnosis not present

## 2016-07-10 DIAGNOSIS — I4891 Unspecified atrial fibrillation: Secondary | ICD-10-CM | POA: Diagnosis not present

## 2016-07-10 DIAGNOSIS — M1990 Unspecified osteoarthritis, unspecified site: Secondary | ICD-10-CM | POA: Diagnosis not present

## 2016-07-10 DIAGNOSIS — Z87891 Personal history of nicotine dependence: Secondary | ICD-10-CM | POA: Diagnosis not present

## 2016-07-10 DIAGNOSIS — R1310 Dysphagia, unspecified: Secondary | ICD-10-CM | POA: Diagnosis not present

## 2016-07-10 DIAGNOSIS — I69354 Hemiplegia and hemiparesis following cerebral infarction affecting left non-dominant side: Secondary | ICD-10-CM | POA: Diagnosis not present

## 2016-07-10 DIAGNOSIS — I69391 Dysphagia following cerebral infarction: Secondary | ICD-10-CM | POA: Diagnosis not present

## 2016-07-10 DIAGNOSIS — D649 Anemia, unspecified: Secondary | ICD-10-CM | POA: Diagnosis not present

## 2016-07-10 DIAGNOSIS — E785 Hyperlipidemia, unspecified: Secondary | ICD-10-CM | POA: Diagnosis not present

## 2016-07-16 ENCOUNTER — Other Ambulatory Visit: Payer: Self-pay | Admitting: Internal Medicine

## 2016-07-17 ENCOUNTER — Ambulatory Visit: Payer: Self-pay | Admitting: General Practice

## 2016-07-17 DIAGNOSIS — D649 Anemia, unspecified: Secondary | ICD-10-CM | POA: Diagnosis not present

## 2016-07-17 DIAGNOSIS — R1310 Dysphagia, unspecified: Secondary | ICD-10-CM | POA: Diagnosis not present

## 2016-07-17 DIAGNOSIS — Z9889 Other specified postprocedural states: Secondary | ICD-10-CM

## 2016-07-17 DIAGNOSIS — I69354 Hemiplegia and hemiparesis following cerebral infarction affecting left non-dominant side: Secondary | ICD-10-CM | POA: Diagnosis not present

## 2016-07-17 DIAGNOSIS — I69391 Dysphagia following cerebral infarction: Secondary | ICD-10-CM | POA: Diagnosis not present

## 2016-07-17 DIAGNOSIS — M1990 Unspecified osteoarthritis, unspecified site: Secondary | ICD-10-CM | POA: Diagnosis not present

## 2016-07-17 DIAGNOSIS — I4891 Unspecified atrial fibrillation: Secondary | ICD-10-CM | POA: Diagnosis not present

## 2016-07-18 DIAGNOSIS — I69391 Dysphagia following cerebral infarction: Secondary | ICD-10-CM | POA: Diagnosis not present

## 2016-07-18 DIAGNOSIS — I69354 Hemiplegia and hemiparesis following cerebral infarction affecting left non-dominant side: Secondary | ICD-10-CM | POA: Diagnosis not present

## 2016-07-18 DIAGNOSIS — I4891 Unspecified atrial fibrillation: Secondary | ICD-10-CM | POA: Diagnosis not present

## 2016-07-18 DIAGNOSIS — M1990 Unspecified osteoarthritis, unspecified site: Secondary | ICD-10-CM | POA: Diagnosis not present

## 2016-07-18 DIAGNOSIS — D649 Anemia, unspecified: Secondary | ICD-10-CM | POA: Diagnosis not present

## 2016-07-18 DIAGNOSIS — R1310 Dysphagia, unspecified: Secondary | ICD-10-CM | POA: Diagnosis not present

## 2016-07-20 DIAGNOSIS — D649 Anemia, unspecified: Secondary | ICD-10-CM | POA: Diagnosis not present

## 2016-07-20 DIAGNOSIS — I69354 Hemiplegia and hemiparesis following cerebral infarction affecting left non-dominant side: Secondary | ICD-10-CM | POA: Diagnosis not present

## 2016-07-20 DIAGNOSIS — I69391 Dysphagia following cerebral infarction: Secondary | ICD-10-CM | POA: Diagnosis not present

## 2016-07-20 DIAGNOSIS — R1310 Dysphagia, unspecified: Secondary | ICD-10-CM | POA: Diagnosis not present

## 2016-07-20 DIAGNOSIS — M1990 Unspecified osteoarthritis, unspecified site: Secondary | ICD-10-CM | POA: Diagnosis not present

## 2016-07-20 DIAGNOSIS — I4891 Unspecified atrial fibrillation: Secondary | ICD-10-CM | POA: Diagnosis not present

## 2016-07-21 DIAGNOSIS — I4891 Unspecified atrial fibrillation: Secondary | ICD-10-CM | POA: Diagnosis not present

## 2016-07-21 DIAGNOSIS — R1310 Dysphagia, unspecified: Secondary | ICD-10-CM | POA: Diagnosis not present

## 2016-07-21 DIAGNOSIS — I69354 Hemiplegia and hemiparesis following cerebral infarction affecting left non-dominant side: Secondary | ICD-10-CM | POA: Diagnosis not present

## 2016-07-21 DIAGNOSIS — D649 Anemia, unspecified: Secondary | ICD-10-CM | POA: Diagnosis not present

## 2016-07-21 DIAGNOSIS — M1990 Unspecified osteoarthritis, unspecified site: Secondary | ICD-10-CM | POA: Diagnosis not present

## 2016-07-21 DIAGNOSIS — I69391 Dysphagia following cerebral infarction: Secondary | ICD-10-CM | POA: Diagnosis not present

## 2016-07-24 DIAGNOSIS — I4891 Unspecified atrial fibrillation: Secondary | ICD-10-CM | POA: Diagnosis not present

## 2016-07-24 DIAGNOSIS — M1990 Unspecified osteoarthritis, unspecified site: Secondary | ICD-10-CM | POA: Diagnosis not present

## 2016-07-24 DIAGNOSIS — R1310 Dysphagia, unspecified: Secondary | ICD-10-CM | POA: Diagnosis not present

## 2016-07-24 DIAGNOSIS — I69354 Hemiplegia and hemiparesis following cerebral infarction affecting left non-dominant side: Secondary | ICD-10-CM | POA: Diagnosis not present

## 2016-07-24 DIAGNOSIS — I69391 Dysphagia following cerebral infarction: Secondary | ICD-10-CM | POA: Diagnosis not present

## 2016-07-24 DIAGNOSIS — D649 Anemia, unspecified: Secondary | ICD-10-CM | POA: Diagnosis not present

## 2016-07-25 ENCOUNTER — Other Ambulatory Visit: Payer: Self-pay | Admitting: Internal Medicine

## 2016-07-25 ENCOUNTER — Encounter: Payer: Self-pay | Admitting: Neurology

## 2016-07-25 ENCOUNTER — Ambulatory Visit (INDEPENDENT_AMBULATORY_CARE_PROVIDER_SITE_OTHER): Payer: Medicare Other | Admitting: Neurology

## 2016-07-25 VITALS — BP 94/57 | HR 51 | Wt 144.6 lb

## 2016-07-25 DIAGNOSIS — G8194 Hemiplegia, unspecified affecting left nondominant side: Secondary | ICD-10-CM | POA: Diagnosis not present

## 2016-07-25 DIAGNOSIS — I639 Cerebral infarction, unspecified: Secondary | ICD-10-CM

## 2016-07-25 NOTE — Progress Notes (Signed)
[facility] [address] [place]. [place] [address] [phone]       OFFICE FOLLOW-UP NOTE  Brian [name] Date of Birth:  1932/09/19 Medical Record Number:  [id number]   HPI: Mr Brian [name] is a pleasant elderly Caucasian male seen today for the first office follow-up visit following hospital admission for stroke in September 2017. He is accompanied by his daughter and caregiver. Kionte Surman Gordonis an 82 y.o.malewith past medical history of TIA, hypertension, atrial flutter. Patient was brought to the emergency department as code stroke after he was found down in the kitchen with left-sided hemiparesis. Patient's last known normal was 6:15 PM the prior night. Per son as 6:15 this morning patient called out to him and he was found on the floor not moving his left side. Patient was brought in for immediate CT scan which showed a right hyperdense MCA. At that time patient was considered to be a possible wake up stroke. Patient was brought for immediate CT angiogram and perfusion. CT perfusion showed significant amount of viable tissue. For this reason he was brought to intervention radiology. 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 Cineangiogram showed thrombus in the right carotid terminus. Patient was kept in the intensive care and and blood pressure was tightly controlled. He was extubated and did well. MRI scan and subsequent to confirm an acute infarct involving right caudate head and slight petechial hemorrhage in the right basal ganglia. Transthoracic echo showed normal ejection fraction. Hemoglobin A1c was 5.3. LDL cholesterol 62 mg  percent. He was seen by physical ,occupational  And speech therapy. He had persistent atrial fibrillation and his INR was optimal 2.8 on admission hence he was switched to eliquis for anticoagulation. Patient was discharged home with home physical and occupation therapy. He is at home and still getting therapy. His able to walk with a walker himself but yet his balance remains poor. The patient is not able to sleep well at night mostly because of his bladder urgency and frequency of urination. Patient has a 24-hour caregiver at home. He feels physically his left-sided strength has improved but his balance is poor. He plans to dental cleanings soon. His starting eliquis well without bleeding or bruising. Patient and daughter are interested in outpatient therapy as well. ROS:   14 system review of systems is positive for activity and appetite change, fatigue, hearing loss, runny nose, cold intolerance, disturbed sleep, urinary frequency. Knee pain, walking difficulty, anxiety, depression and all other systems negative  PMH:  Past Medical History:  Diagnosis Date  . Anemia   . Atrial flutter ([place])   . BPH (benign prostatic hypertrophy)   . Epistaxis   . Hemorrhage of gastrointestinal tract, unspecified   . Hypertension   . Osteoarthrosis, unspecified whether generalized or localized, unspecified site   . Personal history of venous thrombosis and embolism   . Rosacea   . Stroke ([place])   . TIA (transient ischemic attack)     Social History:  Social History   Social History  . Marital status: Married    Spouse name: N/A  . Number of children: N/A  . Years of education: N/A   Occupational History  . Not on file.  Social History Main Topics  . Smoking status: Former Research scientist (life sciences)  . Smokeless tobacco: Never Used     Comment: quit 50 yr ago  . Alcohol use No  . Drug use: No  . Sexual activity: Not on file   Other Topics Concern  . Not on file   Social History Narrative  . No narrative on  file    Medications:   Current Outpatient Prescriptions on File Prior to Visit  Medication Sig Dispense Refill  . ALPRAZolam (XANAX) 0.25 MG tablet Take 1 tablet (0.25 mg total) by mouth at bedtime. 90 tablet 1  . apixaban (ELIQUIS) 2.5 MG TABS tablet Take 1 tablet (2.5 mg total) by mouth 2 (two) times daily. 60 tablet 4  . clindamycin (CLEOCIN) 150 MG capsule TAKE 2 CAPSULES BY MOUTH PRIOR TO DENTAL WORK AS DIRECTED 2 capsule 2  . doxycycline (VIBRA-TABS) 100 MG tablet Take 1 tablet (100 mg total) by mouth 2 (two) times daily. 180 tablet 3  . escitalopram (LEXAPRO) 10 MG tablet TAKE 1 TABLET(10 MG) BY MOUTH DAILY 60 tablet 5  . finasteride (PROSCAR) 5 MG tablet Take 1 tablet (5 mg total) by mouth daily. 90 tablet 3  . hydrocortisone-pramoxine (ANALPRAM-HC) 2.5-1 % rectal cream Place 1 application rectally 3 (three) times daily. 30 g 0  . metoprolol succinate (TOPROL-XL) 25 MG 24 hr tablet TAKE 1 TABLET BY MOUTH EVERY MORNING AND 2 TABLETS BY MOUTH EVERY EVENING 270 tablet 3  . pantoprazole (PROTONIX) 40 MG tablet Take 1 tablet (40 mg total) by mouth daily. 90 tablet 3  . saw palmetto 160 MG capsule Take 1 capsule (160 mg total) by mouth 2 (two) times daily.     No current facility-administered medications on file prior to visit.     Allergies:   Allergies  Allergen Reactions  . Novocain [Procaine Hcl]     Irregular heart beat     . Penicillins     REACTION: itching    Physical Exam General: Frail cachectic elderly Caucasian male, seated, in no evident distress Head: head normocephalic and atraumatic.  Neck: supple with no carotid or supraclavicular bruits Cardiovascular: regular rate and rhythm, no murmurs Musculoskeletal: no deformity Skin:  no rash/petichiae Vascular:  Normal pulses all extremities Vitals:   07/25/16 1422  BP: (!) 94/57  Pulse: (!) 51   Neurologic Exam Mental Status: Awake and fully alert. Oriented to place and time. Recent and remote memory intact.  Attention span, concentration and fund of knowledge appropriate. Mood and affect appropriate.  Cranial Nerves: Fundoscopic exam reveals sharp disc margins. Pupils equal, briskly reactive to light. Extraocular movements full without nystagmus. Visual fields full to confrontation. Hearing intact. Facial sensation intact. Mild left facial asymmetry on smiling only. Tongue, palate moves normally and symmetrically.  Motor: Mild left hemiparesis 4/5 strength with weakness of left grip and intrinsic hand muscles. Orbits right over left upper extremity. Mild weakness of left hip flexors and ankle dorsiflexors. No upper or lower extremity drift. Sensory.: intact to touch ,pinprick .position and vibratory sensation.  Coordination: Rapid alternating movements normal in all extremities. Finger-to-nose and heel-to-shin performed accurately bilaterally. Gait and Station: Deferred as patient did not bring his walker. Reflexes: 1+ and symmetric. Toes downgoing.   NIHSS  2 Modified Rankin  2   ASSESSMENT: 85 year Caucasian male with embolic right basal ganglia infarct in September 2017 secondary to atrial fibrillation due to right middle cerebral artery occlusion treated with mechanical embolectomy with complete recanalization with excellent clinical  outcome. He still has mild left hemiparesis and gait and balance difficulties    PLAN: I had a long d/w patient about his recent stroke, risk for recurrent stroke/TIAs, personally independently reviewed imaging studies and stroke evaluation results and answered questions.Continue Eliquis (apixaban) daily  for secondary stroke prevention and maintain strict control of hypertension with blood pressure goal below 130/90, diabetes with hemoglobin A1c goal below 6.5% and lipids with LDL cholesterol goal below 70 mg/dL. I also advised the patient to eat a healthy diet   exercise regularly and maintain ideal body weight. Continue ongoing home physical occupational therapy and  I will order for outpatient physical and occupational therapy after that finishes. I also recommend patient see his urologist to discuss medications for his prostatic symptoms. He may have  dental cleaning as long as he does not have to hold eliquis for the procedure Followup in the future with me in  6 months or call earlier if necessary. Greater than 50% of time during this 25 minute visit was spent on counseling,explanation of diagnosis, planning of further management, discussion with patient and family and coordination of care Antony Contras, MD  [facility] [address] [place], [place] [address]  Phone [phone] Fax [phone] Note: This document was prepared with digital dictation and possible smart phrase technology. Any transcriptional errors that result from this process are unintentional

## 2016-07-25 NOTE — Patient Instructions (Addendum)
Stroke Prevention Some medical conditions and behaviors are associated with an increased chance of having a stroke. You may prevent a stroke by making healthy choices and managing medical conditions. How can I reduce my risk of having a stroke?  Stay physically active. Get at least 30 minutes of activity on most or all days.  Do not smoke. It may also be helpful to avoid exposure to secondhand smoke.  Limit alcohol use. Moderate alcohol use is considered to be:  No more than 2 drinks per day for men.  No more than 1 drink per day for nonpregnant women.  Eat healthy foods. This involves:  Eating 5 or more servings of fruits and vegetables a day.  Making dietary changes that address high blood pressure (hypertension), high cholesterol, diabetes, or obesity.  Manage your cholesterol levels.  Making food choices that are high in fiber and low in saturated fat, trans fat, and cholesterol may control cholesterol levels.  Take any prescribed medicines to control cholesterol as directed by your health care provider.  Manage your diabetes.  Controlling your carbohydrate and sugar intake is recommended to manage diabetes.  Take any prescribed medicines to control diabetes as directed by your health care provider.  Control your hypertension.  Making food choices that are low in salt (sodium), saturated fat, trans fat, and cholesterol is recommended to manage hypertension.  Ask your health care provider if you need treatment to lower your blood pressure. Take any prescribed medicines to control hypertension as directed by your health care provider.  If you are 18-39 years of age, have your blood pressure checked every 3-5 years. If you are 40 years of age or older, have your blood pressure checked every year.  Maintain a healthy weight.  Reducing calorie intake and making food choices that are low in sodium, saturated fat, trans fat, and cholesterol are recommended to manage  weight.  Stop drug abuse.  Avoid taking birth control pills.  Talk to your health care provider about the risks of taking birth control pills if you are over 35 years old, smoke, get migraines, or have ever had a blood clot.  Get evaluated for sleep disorders (sleep apnea).  Talk to your health care provider about getting a sleep evaluation if you snore a lot or have excessive sleepiness.  Take medicines only as directed by your health care provider.  For some people, aspirin or blood thinners (anticoagulants) are helpful in reducing the risk of forming abnormal blood clots that can lead to stroke. If you have the irregular heart rhythm of atrial fibrillation, you should be on a blood thinner unless there is a good reason you cannot take them.  Understand all your medicine instructions.  Make sure that other conditions (such as anemia or atherosclerosis) are addressed. Get help right away if:  You have sudden weakness or numbness of the face, arm, or leg, especially on one side of the body.  Your face or eyelid droops to one side.  You have sudden confusion.  You have trouble speaking (aphasia) or understanding.  You have sudden trouble seeing in one or both eyes.  You have sudden trouble walking.  You have dizziness.  You have a loss of balance or coordination.  You have a sudden, severe headache with no known cause.  You have new chest pain or an irregular heartbeat. Any of these symptoms may represent a serious problem that is an emergency. Do not wait to see if the symptoms will go away.   Get medical help at once. Call your local emergency services (911 in [place] Do not drive yourself to the hospital. This information is not intended to replace advice given to you by your health care provider. Make sure you discuss any questions you have with your health care provider. Document Released: 09/14/2004 Document Revised: 01/13/2016 Document Reviewed: 02/07/2013 Elsevier  Interactive Patient Education  2017 [employer]vier Inc.  

## 2016-07-27 ENCOUNTER — Ambulatory Visit: Payer: Medicare Other | Admitting: Neurology

## 2016-07-27 DIAGNOSIS — R1310 Dysphagia, unspecified: Secondary | ICD-10-CM | POA: Diagnosis not present

## 2016-07-27 DIAGNOSIS — D649 Anemia, unspecified: Secondary | ICD-10-CM | POA: Diagnosis not present

## 2016-07-27 DIAGNOSIS — M1990 Unspecified osteoarthritis, unspecified site: Secondary | ICD-10-CM | POA: Diagnosis not present

## 2016-07-27 DIAGNOSIS — I69391 Dysphagia following cerebral infarction: Secondary | ICD-10-CM | POA: Diagnosis not present

## 2016-07-27 DIAGNOSIS — I69354 Hemiplegia and hemiparesis following cerebral infarction affecting left non-dominant side: Secondary | ICD-10-CM | POA: Diagnosis not present

## 2016-07-27 DIAGNOSIS — I4891 Unspecified atrial fibrillation: Secondary | ICD-10-CM | POA: Diagnosis not present

## 2016-07-28 DIAGNOSIS — D649 Anemia, unspecified: Secondary | ICD-10-CM | POA: Diagnosis not present

## 2016-07-28 DIAGNOSIS — I69354 Hemiplegia and hemiparesis following cerebral infarction affecting left non-dominant side: Secondary | ICD-10-CM | POA: Diagnosis not present

## 2016-07-28 DIAGNOSIS — R1310 Dysphagia, unspecified: Secondary | ICD-10-CM | POA: Diagnosis not present

## 2016-07-28 DIAGNOSIS — I4891 Unspecified atrial fibrillation: Secondary | ICD-10-CM | POA: Diagnosis not present

## 2016-07-28 DIAGNOSIS — M1990 Unspecified osteoarthritis, unspecified site: Secondary | ICD-10-CM | POA: Diagnosis not present

## 2016-07-28 DIAGNOSIS — I69391 Dysphagia following cerebral infarction: Secondary | ICD-10-CM | POA: Diagnosis not present

## 2016-08-02 DIAGNOSIS — I69391 Dysphagia following cerebral infarction: Secondary | ICD-10-CM | POA: Diagnosis not present

## 2016-08-02 DIAGNOSIS — D649 Anemia, unspecified: Secondary | ICD-10-CM | POA: Diagnosis not present

## 2016-08-02 DIAGNOSIS — I69354 Hemiplegia and hemiparesis following cerebral infarction affecting left non-dominant side: Secondary | ICD-10-CM | POA: Diagnosis not present

## 2016-08-02 DIAGNOSIS — R1310 Dysphagia, unspecified: Secondary | ICD-10-CM | POA: Diagnosis not present

## 2016-08-02 DIAGNOSIS — M1990 Unspecified osteoarthritis, unspecified site: Secondary | ICD-10-CM | POA: Diagnosis not present

## 2016-08-02 DIAGNOSIS — I4891 Unspecified atrial fibrillation: Secondary | ICD-10-CM | POA: Diagnosis not present

## 2016-08-03 DIAGNOSIS — I4891 Unspecified atrial fibrillation: Secondary | ICD-10-CM | POA: Diagnosis not present

## 2016-08-03 DIAGNOSIS — D649 Anemia, unspecified: Secondary | ICD-10-CM | POA: Diagnosis not present

## 2016-08-03 DIAGNOSIS — I69391 Dysphagia following cerebral infarction: Secondary | ICD-10-CM | POA: Diagnosis not present

## 2016-08-03 DIAGNOSIS — R1310 Dysphagia, unspecified: Secondary | ICD-10-CM | POA: Diagnosis not present

## 2016-08-03 DIAGNOSIS — M1990 Unspecified osteoarthritis, unspecified site: Secondary | ICD-10-CM | POA: Diagnosis not present

## 2016-08-03 DIAGNOSIS — I69354 Hemiplegia and hemiparesis following cerebral infarction affecting left non-dominant side: Secondary | ICD-10-CM | POA: Diagnosis not present

## 2016-08-07 ENCOUNTER — Telehealth: Payer: Self-pay

## 2016-08-07 NOTE — Telephone Encounter (Signed)
RN call [facility] about receiving a order for knee orthosis. Rn explain only saw the pt for a stroke follow up. Rn stated Dr. Leonie Man does not treat or manage osteoarthritis or low back pain. The ICD code is unilateral primary osteoarthritis and low back pain.The rep will sent it to the correct md for signature.

## 2016-08-08 DIAGNOSIS — R1310 Dysphagia, unspecified: Secondary | ICD-10-CM | POA: Diagnosis not present

## 2016-08-08 DIAGNOSIS — I4891 Unspecified atrial fibrillation: Secondary | ICD-10-CM | POA: Diagnosis not present

## 2016-08-08 DIAGNOSIS — M1990 Unspecified osteoarthritis, unspecified site: Secondary | ICD-10-CM | POA: Diagnosis not present

## 2016-08-08 DIAGNOSIS — I69354 Hemiplegia and hemiparesis following cerebral infarction affecting left non-dominant side: Secondary | ICD-10-CM | POA: Diagnosis not present

## 2016-08-08 DIAGNOSIS — I69391 Dysphagia following cerebral infarction: Secondary | ICD-10-CM | POA: Diagnosis not present

## 2016-08-08 DIAGNOSIS — D649 Anemia, unspecified: Secondary | ICD-10-CM | POA: Diagnosis not present

## 2016-08-10 DIAGNOSIS — I4891 Unspecified atrial fibrillation: Secondary | ICD-10-CM | POA: Diagnosis not present

## 2016-08-10 DIAGNOSIS — I69391 Dysphagia following cerebral infarction: Secondary | ICD-10-CM | POA: Diagnosis not present

## 2016-08-10 DIAGNOSIS — I69354 Hemiplegia and hemiparesis following cerebral infarction affecting left non-dominant side: Secondary | ICD-10-CM | POA: Diagnosis not present

## 2016-08-10 DIAGNOSIS — R1310 Dysphagia, unspecified: Secondary | ICD-10-CM | POA: Diagnosis not present

## 2016-08-10 DIAGNOSIS — D649 Anemia, unspecified: Secondary | ICD-10-CM | POA: Diagnosis not present

## 2016-08-10 DIAGNOSIS — M1990 Unspecified osteoarthritis, unspecified site: Secondary | ICD-10-CM | POA: Diagnosis not present

## 2016-08-12 ENCOUNTER — Other Ambulatory Visit: Payer: Self-pay | Admitting: Internal Medicine

## 2016-08-15 ENCOUNTER — Encounter: Payer: Self-pay | Admitting: Occupational Therapy

## 2016-08-15 ENCOUNTER — Ambulatory Visit: Payer: Medicare Other | Admitting: Occupational Therapy

## 2016-08-15 ENCOUNTER — Ambulatory Visit: Payer: Medicare Other | Attending: Neurology | Admitting: Physical Therapy

## 2016-08-15 ENCOUNTER — Encounter: Payer: Self-pay | Admitting: Physical Therapy

## 2016-08-15 VITALS — BP 123/74 | HR 72

## 2016-08-15 DIAGNOSIS — R2689 Other abnormalities of gait and mobility: Secondary | ICD-10-CM | POA: Insufficient documentation

## 2016-08-15 DIAGNOSIS — M25612 Stiffness of left shoulder, not elsewhere classified: Secondary | ICD-10-CM | POA: Insufficient documentation

## 2016-08-15 DIAGNOSIS — M25512 Pain in left shoulder: Secondary | ICD-10-CM

## 2016-08-15 DIAGNOSIS — I69354 Hemiplegia and hemiparesis following cerebral infarction affecting left non-dominant side: Secondary | ICD-10-CM | POA: Insufficient documentation

## 2016-08-15 DIAGNOSIS — R261 Paralytic gait: Secondary | ICD-10-CM | POA: Diagnosis not present

## 2016-08-15 DIAGNOSIS — R2681 Unsteadiness on feet: Secondary | ICD-10-CM

## 2016-08-15 DIAGNOSIS — G8929 Other chronic pain: Secondary | ICD-10-CM | POA: Insufficient documentation

## 2016-08-15 DIAGNOSIS — R278 Other lack of coordination: Secondary | ICD-10-CM | POA: Insufficient documentation

## 2016-08-15 DIAGNOSIS — M6281 Muscle weakness (generalized): Secondary | ICD-10-CM | POA: Diagnosis not present

## 2016-08-15 NOTE — Telephone Encounter (Signed)
Writer was insure of which pharmacy to use, so pt was called and asked whick pahrmacy medication needs to be called in to. Pt informed Probation officer that they use [facility] in [place] off of[address]. Rx was called in to the [facility] as requested by pt.

## 2016-08-15 NOTE — Therapy (Signed)
[facility] [address] [place], [place], [address] Phone: [phone]   Fax:  [phone]  Occupational Therapy Evaluation  Patient Details  Name: Brian [name] MRN: [id number] Date of Birth: 03-Feb-1933 Referring Provider: Dr. Leonie Man  Encounter Date: 08/15/2016      OT End of Session - 08/15/16 1152    Visit Number 1   Number of Visits 16   Date for OT Re-Evaluation 10/10/16   Authorization Type Medicare - pt will need G code and PN every 10th visit   Authorization Time Period 60 days   Authorization - Visit Number 1   Authorization - Number of Visits 10   OT Start Time 1018   OT Stop Time 1100   OT Time Calculation (min) 42 min   Activity Tolerance --      Past Medical History:  Diagnosis Date  . Anemia   . Atrial flutter ([place])   . BPH (benign prostatic hypertrophy)   . Epistaxis   . Hemorrhage of gastrointestinal tract, unspecified   . Hypertension   . Osteoarthrosis, unspecified whether generalized or localized, unspecified site   . Personal history of venous thrombosis and embolism   . Rosacea   . Stroke ([place])   . TIA (transient ischemic attack)     Past Surgical History:  Procedure Laterality Date  . APPENDECTOMY    . HERNIA REPAIR    . IR GENERIC HISTORICAL  05/04/2016   IR PERCUTANEOUS ART THROMBECTOMY/INFUSION INTRACRANIAL INC DIAG ANGIO 05/04/2016 [name], MD [facility]  . IR GENERIC HISTORICAL  05/04/2016   IR ANGIO VERTEBRAL SEL SUBCLAVIAN INNOMINATE UNI R MOD SED 05/04/2016 [name], MD [facility]  . IR GENERIC HISTORICAL  06/07/2016   IR RADIOLOGIST EVAL & MGMT 06/07/2016 [facility]  . JOINT REPLACEMENT    . MITRAL VALVE REPAIR    . mvp repair    . RADIOLOGY WITH ANESTHESIA N/A 05/04/2016   Procedure: RADIOLOGY WITH ANESTHESIA;  Surgeon: [name], MD;  Location: [facility];  Service: Radiology;  Laterality: N/A;  . TOTAL HIP ARTHROPLASTY      Vitals:    08/15/16 1026  BP: 123/74  Pulse: 72        Subjective Assessment - 08/15/16 1026    Patient is accompained by: --  caregiver [name]   Pertinent History see epic    Currently in Pain? Yes   Pain Score 4    Pain Location Hip   Pain Orientation Right;Left   Pain Descriptors / Indicators Aching   Pain Type Chronic pain   Pain Onset More than a month ago   Pain Frequency Constant   Aggravating Factors  walking, up on my feet   Pain Relieving Factors lay down, sitting for a little while   Multiple Pain Sites Yes   Pain Score 4   Pain Location Back   Pain Orientation Lower   Pain Descriptors / Indicators Aching   Pain Type Chronic pain;Other (Comment)   Pain Onset More than a month ago   Aggravating Factors  walking, sitting too long   Pain Relieving Factors lay down, sitting for short periods           [facility] OT Assessment - 08/15/16 0001      Assessment   Diagnosis R BG CVA   Referring Provider Dr. Leonie Man   Onset Date 05/04/16   Prior Therapy HHPT,OT, ST     Precautions   Precautions Fall   Required Braces  or Orthoses Other Brace/Splint  Pt has knee brace and back brace during exercise for comfort     Restrictions   Weight Bearing Restrictions No     Balance Screen   Has the patient fallen in the past 6 months No     Home  Environment   Family/patient expects to be discharged to: Private residence   Living Arrangements Spouse/significant other  wife, 10:30-5 caregiver, son (assists at night)   Type of [place] One level   Bathroom Shower/Tub Haematologist   Additional Comments Pt has transfer tub bench and 3 in 1 commode.      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     ADL   Eating/Feeding Minimal assistance  cutting   Grooming Independent   Upper Body Bathing  Minimal assistance  caregiver rinses pt but pt does the rest   Lower Body Bathing Minimal assistance  caregiver assists with rinsing   Upper Body Dressing Minimal assistance  assist with buttons   Lower Body Dressing Maximal assistance  assist for putting pants/underwear over feet, socks/shoe   Toilet Tranfer Modified independent  pt/caregiver report pt is mod I ; unsteady in clinic   Toileting - Water quality scientist Modified independent   [place] Transfer Moderate assistance  caregiver assists with sitting down(fear)/feet over   ADL comments Pt needs assist with putting pants over feet due to back pain and hip pain     IADL   Shopping Assistance for transportation;Completely unable to shop   Light Housekeeping Does not participate in any housekeeping tasks   Meal Prep Needs to have meals prepared and served   [employer] on family or friends for transportation   Medication Management Is not capable of dispensing or managing own medication   Financial Management Requires assistance     Mobility   Mobility Status Needs assist   Mobility Status Comments assist in community.  uses walker.  Pt reports he walks in house mod I however is unsteady in the clinic today. Caregiver reports he is "nervous and does better than this at home"     Written Expression   Dominant Hand Right     Vision - History   Baseline Vision Wears glasses only for reading     Vision Assessment   Comment Pt denies any visual changes     Activity Tolerance   Activity Tolerance Tolerates 10-20 min activity with muiltiple rests     Cognition   Overall Cognitive Status Within Functional Limits for tasks assessed   Mini Mental State Exam  Appears to be [facility] however will monitor via functional activity.  Caregiver reports that she does not notice any memory or attentional issues.  Pt has no reported unsafe behaviors and has had no falls.     Sensation    Light Touch Appears Intact   Hot/Cold Appears Intact   Proprioception Appears Intact     Coordination   Gross Motor Movements are Fluid and Coordinated No   Fine Motor Movements are Fluid and Coordinated No   Finger Nose Finger Test Moderately impaired   Box and Blocks L= 15  R=29     Tone   Assessment Location Left Upper Extremity     ROM /  Strength   AROM / PROM / Strength AROM;PROM;Strength     AROM   Overall AROM  Deficits   Overall AROM Comments LUE- shoulder flexion to approximately 110*, abduction to approximately 100*, supination 25% of range.  Pt reports that he has bad arthritis in L shoulder and he was not able to do full ROM prior to stroke.  Feels that ROM and shoulder pain are the same as before the stroke (4/10) however arm feels weaker.      PROM   Overall PROM  Deficits   Overall PROM Comments Same as AROM - pt with long standing and significant arthritic changes in L shoulder     Strength   Overall Strength Deficits   Overall Strength Comments 3+/5 for proximal strength in LUE, see grip strength below.      Hand Function   Right Hand Gross Grasp Functional   Right Hand Grip (lbs) 40   Left Hand Gross Grasp Impaired   Left Hand Grip (lbs) 25     LUE Tone   LUE Tone Within Functional Limits                           OT Short Term Goals - 08/15/16 1136      OT SHORT TERM GOAL #1   Title Pt and caregiver will be mod I with HEP - 09/12/2016   Status New     OT SHORT TERM GOAL #2   Title Pt will demonstrate improved grip strength by 2 pounds to assist with functional tasks. (baseline = 25 pounds)   Status New     OT SHORT TERM GOAL #3   Title Pt will require min a for tub bench transfers   Status New     OT SHORT TERM GOAL #4   Title Pt will be mod I to button large buttons    Status New     OT SHORT TERM GOAL #5   Title Pt will be able to don pants over feet with vc's only, AE as necessary   Status New           OT  Long Term Goals - 08/15/16 1140      OT LONG TERM GOAL #1   Title Pt and caregiver will be mod I with upgraded HEP - 10/10/2016   Status New     OT LONG TERM GOAL #2   Title Pt will demonstrate improve grip strength by 5 pounds to assist with functional tasks (baseline = 25)   Status New     OT LONG TERM GOAL #3   Title Pt will improve on box and blocks by at least 5 blocks to indicate improve LUE function for tasks. (baseline = 15)   Status New     OT LONG TERM GOAL #4   Title Pt will be min a for LB dressing, AE prn   Status New     OT LONG TERM GOAL #5   Title Pt will be able to lift light weight object off overhead shelf x3 without dropping   Status New     OT LONG TERM GOAL #6   Title Pt will be mod I for zipping   Status New     OT LONG TERM GOAL #7   Title Pt will be able to use LUE as non dominant UE during basic ADL tasks.    Status New  Plan - 08/15/16 1145    Clinical Impression Statement Pt is an 82 year old male s/p R BG stroke on 05/04/2016 - pt is s/p emergency embolectomy and recanalization. Pt discharged home with 24 hour assistance and HPT, OT and ST on 05/10/2016.  PMH: ARF, CAD, hip fracture, HTN, TIA, A flutter, mitral valve repair, significat arthritis multiple joints. Pt now present to the outpatient center with the following deficts that impact independence for ADL, IADL and return to work:  L hemiplegia and hemiparesis, decreased strength, decreased coordination, decreased ROM LUE, decreased grip strength, decreased functional use of LUE, decreased balance, decrease activity tolerance, pain in multiple joints from significant arthritis.  Pt will benefit from skilled OT to address these deficits to maximize independence.    Rehab Potential Fair   Clinical Impairments Affecting Rehab Potential significant arthritis and pain in muliple joints (back, hips, L shoulder), decreased activity tolerance   OT Frequency 2x / week   OT Duration 8  weeks   OT Treatment/Interventions Self-care/ADL training;Moist Heat;DME and/or AE instruction;Neuromuscular education;Therapeutic exercise;Functional Mobility Training;Manual Therapy;Passive range of motion;Splinting;Therapeutic activities;Balance training;Patient/family education   Plan initiate HEP as able with arthritic changes in mind, fine motor and UE functional use.   Consulted and Agree with Plan of Care Patient      Patient will benefit from skilled therapeutic intervention in order to improve the following deficits and impairments:  Abnormal gait, Decreased activity tolerance, Decreased balance, Decreased coordination, Decreased knowledge of use of DME, Decreased mobility, Decreased range of motion, Difficulty walking, Decreased strength, Impaired UE functional use, Pain  Visit Diagnosis: Hemiplegia and hemiparesis following cerebral infarction affecting left non-dominant side ([place]) - Plan: Ot plan of care cert/re-cert  Muscle weakness (generalized) - Plan: Ot plan of care cert/re-cert  Other lack of coordination - Plan: Ot plan of care cert/re-cert  Chronic left shoulder pain - Plan: Ot plan of care cert/re-cert  Unsteadiness on feet - Plan: Ot plan of care cert/re-cert  Stiffness of left shoulder, not elsewhere classified - Plan: Ot plan of care cert/re-cert      G-Codes - [id number]    Functional Assessment Tool Used box and blocks, skilled clinical observation   Functional Limitation Self care   Self Care Current Status [id number]) At least 60 percent but less than 80 percent impaired, limited or restricted   Self Care Goal Status [id number]) At least 20 percent but less than 40 percent impaired, limited or restricted      Problem List Patient Active Problem List   Diagnosis Date Noted  . Cerebrovascular accident (CVA) due to thrombosis of right middle cerebral artery ([place])   . Benign essential HTN   . TIA (transient ischemic attack)   . Coronary artery disease  involving native coronary artery of native heart without angina pectoris   . H/O mitral valve repair   . Tachypnea   . Paroxysmal atrial fibrillation ([facility])   . Hypokalemia   . Acute blood loss anemia   . Thrombocytopenia ([place])   . Dysphagia   . Dysarthria, post-stroke   . Dysphagia, post-stroke   . Acute respiratory failure ([place])   . Acute CVA (cerebrovascular accident) ([place])   . Cerebrovascular accident (CVA) due to thrombosis of precerebral artery ([place])   . Stroke (cerebrum) ([place])   . Arterial hypotension   . Hypocalcemia   . Scoliosis (and kyphoscoliosis), idiopathic 01/26/2014  . ROTATOR CUFF SYNDROME, LEFT 03/07/2010  . OTH MALIG NEOPLASM SKIN OTH&UNSPEC PARTS FACE 12/06/2009  . ACTINIC  KERATOSIS, HEAD 09/06/2009  . Osteoarthritis 02/20/2009  . SYNCOPE, HX OF 02/20/2009  . MITRAL VALVE REPLACEMENT, HX OF 02/20/2009  . HIP REPLACEMENT, TOTAL, HX OF 02/20/2009  . APPENDECTOMY, HX OF 02/20/2009  . HERNIORRHAPHY, HX OF 02/20/2009  . EPISTAXIS, RECURRENT 11/19/2008  . UNS ADVRS EFF UNS RX MEDICINAL&BIOLOGICAL SBSTNC 06/12/2008  . OTHER AND UNSPECIFIED MITRAL VALVE DISEASES 03/12/2008  . ATRIAL FLUTTER 03/12/2008  . Essential hypertension 03/06/2007  . PEPTIC ULCER DISEASE 03/06/2007  . BENIGN PROSTATIC HYPERTROPHY 03/06/2007  . Coronary atherosclerosis 02/13/2007  . ROSACEA 02/13/2007  . TRANSIENT ISCHEMIC ATTACK, HX OF 02/13/2007  . DVT, HX OF 02/13/2007    Quay Burow, OTR/L 08/15/2016, 11:58 AM  [facility] [address] [place], [place], [address] Phone: [phone]   Fax:  [phone]  Name: Brian [name] MRN: [id number] Date of Birth: 1932/10/07

## 2016-08-16 NOTE — Therapy (Signed)
[facility] [address] [place], [place], [address] Phone: [phone]   Fax:  [phone]  Physical Therapy Evaluation  Patient Details  Name: Brian [name] MRN: [id number] Date of Birth: [birth year] Referring Provider: Antony Contras, MD  Encounter Date: 08/15/2016      PT End of Session - 08/15/16 1547    Visit Number 1   Number of Visits 17   Date for PT Re-Evaluation 10/13/16   Authorization Type Medicare G-Code & progress note   PT Start Time 1102   PT Stop Time 1140   PT Time Calculation (min) 38 min   Equipment Utilized During Treatment Gait belt   Activity Tolerance Patient limited by fatigue;Patient tolerated treatment well   Behavior During Therapy [facility] for tasks assessed/performed      Past Medical History:  Diagnosis Date  . Anemia   . Atrial flutter ([place])   . BPH (benign prostatic hypertrophy)   . Epistaxis   . Hemorrhage of gastrointestinal tract, unspecified   . Hypertension   . Osteoarthrosis, unspecified whether generalized or localized, unspecified site   . Personal history of venous thrombosis and embolism   . Rosacea   . Stroke ([place])   . TIA (transient ischemic attack)     Past Surgical History:  Procedure Laterality Date  . APPENDECTOMY    . HERNIA REPAIR    . IR GENERIC HISTORICAL  05/04/2016   IR PERCUTANEOUS ART THROMBECTOMY/INFUSION INTRACRANIAL INC DIAG ANGIO 05/04/2016 [name], MD [facility]  . IR GENERIC HISTORICAL  05/04/2016   IR ANGIO VERTEBRAL SEL SUBCLAVIAN INNOMINATE UNI R MOD SED 05/04/2016 [name], MD [facility]  . IR GENERIC HISTORICAL  06/07/2016   IR RADIOLOGIST EVAL & MGMT 06/07/2016 [facility]  . JOINT REPLACEMENT    . MITRAL VALVE REPAIR    . mvp repair    . RADIOLOGY WITH ANESTHESIA N/A 05/04/2016   Procedure: RADIOLOGY WITH ANESTHESIA;  Surgeon: [name], MD;  Location: [facility];  Service: Radiology;  Laterality: N/A;  .  TOTAL HIP ARTHROPLASTY      There were no vitals filed for this visit.       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 He was hospitalized 05/04/2016 - 05/10/2016. He had home health services thru last week. He was referred to [facility] for PT & OT. He presents for evaluation with CNA.    Patient is accompained by: --  CNA   Limitations Lifting;Standing;Walking   Patient Stated Goals To get balance & walking back to pre stroke level   Currently in Pain? Yes   Pain Score 4    Pain Location Hip   Pain Orientation Right;Left   Pain Descriptors / Indicators Aching   Pain Type Chronic pain   Pain Onset More than a month ago   Pain Frequency Constant   Aggravating Factors  walking, up on my feet   Pain Relieving Factors lay down, sitting for a little while   Pain Score 4   Pain Location Back   Pain Orientation Lower   Pain Descriptors / Indicators Aching   Pain Type Chronic pain   Pain Onset More than a month ago   Aggravating Factors  walking, sitting too long   Pain Relieving Factors lay down, sitting for short periods            [facility] PT  Assessment - 08/15/16 1100      Assessment   Medical Diagnosis CVA right Basal Ganglion   Referring Provider Antony Contras, MD   Onset Date/Surgical Date 05/04/16   Hand Dominance Right   Prior Therapy HHPT finished last week     Precautions   Precautions Fall     Balance Screen   Has the patient fallen in the past 6 months No   Has the patient had a decrease in activity level because of a fear of falling?  Yes   Is the patient reluctant to leave their home because of a fear of falling?  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[employer]     Observation/Other Assessments   Focus on Therapeutic Outcomes (FOTO)  51.17 Functional Status   48 Risk Adjusted   Neuro Quality of Life  31.3%  lower extremity   Fear Avoidance Belief Questionnaire (FABQ)  88 (22)     Coordination   Gross Motor Movements are Fluid and Coordinated No   Heel Shin Test dysmetria     Posture/Postural Control   Posture/Postural Control Postural limitations   Postural Limitations Rounded Shoulders;Forward head;Flexed trunk     Tone   Assessment Location Left Lower Extremity     ROM / Strength   AROM / PROM / Strength AROM;Strength     AROM   Overall AROM  Deficits   Overall AROM Comments LLE limited by severe arthritis: patient feels ROM similar to prior level to CVA     PROM   Overall PROM  Deficits   Overall PROM Comments Same as AROM - pt with long standing and significant arthritic changes in hips & knees     Strength   Overall Strength Deficits   Strength Assessment Site Hip;Knee;Ankle   Right/Left Hip Right;Left   Right Hip Flexion 3/5   Right Hip Extension 3-/5   Right Hip ABduction 3-/5   Left Hip Flexion 3-/5   Left Hip Extension 2+/5   Left Hip ABduction 2+/5   Right/Left Knee Left;Right   Right Knee Flexion 3-/5   Right Knee Extension 4/5   Left Knee Flexion 3-/5   Left Knee Extension 4-/5   Right/Left Ankle Left   Left Ankle Dorsiflexion 4/5   Left Ankle Plantar Flexion 3-/5     Transfers   Transfers Sit to Stand;Stand to Sit   Sit to Stand 5: Supervision;With upper extremity  assist;With armrests;From chair/3-in-1  requires RW to stabilize   Stand to Sit 5: Supervision;With upper extremity assist;With armrests;To chair/3-in-1  requires RW support     Ambulation/Gait   Ambulation/Gait Yes   Ambulation/Gait Assistance 4: Min assist   Ambulation/Gait Assistance Details excessive UE weight bearing on RW   Ambulation Distance (Feet) 75 Feet   Assistive device Rolling walker   Gait Pattern Step-to pattern;Decreased step length - right;Decreased stance time -  left;Decreased stride length;Decreased hip/knee flexion - left;Decreased weight shift to left;Left circumduction;Left flexed knee in stance;Antalgic;Trunk flexed;Poor foot clearance - left   Ambulation Surface Indoor;Level   Gait velocity 0.79 ft/sec comfortable & 1.04 ft/sec fast pace     Balance   Balance Assessed Yes     Static Standing Balance   Static Standing - Balance Support Bilateral upper extremity supported  RW support   Static Standing - Level of Assistance 5: Stand by assistance   Static Standing - Comment/# of Minutes stands 60 seconds     Dynamic Standing Balance   Dynamic Standing - Balance Support Bilateral upper extremity supported;Left upper extremity supported  RW support; head turns with BUE & reaching w/ RUE   Dynamic Standing - Level of Assistance 4: Min assist   Dynamic Standing - Balance Activities Head nods;Head turns;Reaching for objects   Dynamic Standing - Comments reaches 5" anteriorly and 20" from floor     LLE Tone   LLE Tone Within Functional Limits                             PT Short Term Goals - 08/15/16 1610      PT SHORT TERM GOAL #1   Title Patient demonstrates understanding of initial HEP with CNA cueing correctly.  (Target Date: 09/15/2016)   Time 4   Period Weeks   Status New     PT SHORT TERM GOAL #2   Title Patient reaches 7" anteriorly and within 10" of floor with RW support with supervision. (Target Date: 09/15/2016)   Time 4    Period Weeks   Status New     PT SHORT TERM GOAL #3   Title Patient ambulates 200' with RW with supervision. (Target Date: 09/15/2016)   Time 4   Period Weeks   Status New     PT SHORT TERM GOAL #4   Title Patient negotiates ramps & curbs with RW with minimal guard. (Target Date: 09/15/2016)   Time 4   Period Weeks   Status New           PT Long Term Goals - 08/15/16 1601      PT LONG TERM GOAL #1   Title Patient & CNA demonstrate understanding of HEP. (Target Date: 10/13/2016)   Time 8   Period Weeks   Status New     PT LONG TERM GOAL #2   Title Patient able to balance in standing with RW support reaching 10" and manages clothes safely.  (Target Date: 10/13/2016)    Time 8   Period Weeks   Status New     PT LONG TERM GOAL #3   Title Patient ambulates 300' with RW with supervision for community mobility  (Target Date: 10/13/2016)   Time 8   Period Weeks   Status New     PT LONG TERM GOAL #4   Title Patient negotiates ramps & curbs with RW and stairs with 2 rails with supervision for community access.  (Target Date: 10/13/2016)   Time 8   Period Weeks   Status New     PT LONG TERM GOAL #5   Title Patient ambulates with RW around furniture 100' modified independent for household mobility.  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 He was limited by severe arthritis but continued to be active in community. He had CVA 14.5 weeks ago that resulted in left hemiparesis limiting function greater than prior level. He had decline in balance requiring assistance for standing ADLs with RW that he was independent prior to CVA. His gait has declined requiring assistance for limited community activities and close supervision for household gait. Patient would benefit from PT to improve gait &  balance towards prior level.    Rehab Potential Good   PT Frequency 2x / week   PT Duration 8 weeks   PT Treatment/Interventions ADLs/Self Care Home Management;DME Instruction;Gait training;Stair training;Functional mobility training;Therapeutic activities;Therapeutic exercise;Balance training;Neuromuscular re-education;Patient/family education   PT Next Visit Plan HEP at counter for balance & strength   Consulted and Agree with Plan of Care Patient;Family member/caregiver   Family Member Consulted CNA      Patient will benefit from skilled therapeutic intervention in order to improve the following deficits and impairments:  Abnormal gait, Decreased activity tolerance, Decreased balance, Decreased coordination, Decreased knowledge of use of DME, Decreased mobility, Decreased range of motion, Decreased strength, Postural dysfunction, Pain  Visit Diagnosis: Paralytic gait  Other abnormalities of gait and mobility  Hemiplegia and hemiparesis following cerebral infarction affecting left non-dominant side (HCC)  Unsteadiness on feet  Muscle weakness (generalized)      G-Codes - 31-Aug-2016 1616    Functional Assessment Tool Used Patient reaches 5" anteriorly and within 20" of floor with RW support with minA.    Functional Limitation Changing and maintaining body position   Changing and Maintaining Body Position Current Status [phone]) At least 80 percent but less than 100 percent impaired, limited or restricted   Changing and Maintaining Body Position Goal Status [id number]) At least 60 percent but less than 80 percent impaired, limited or restricted       Problem List Patient Active Problem List   Diagnosis Date Noted  . Cerebrovascular accident (CVA) due to thrombosis of right middle cerebral artery ([place])   . Benign essential HTN   . TIA (transient ischemic attack)   . Coronary artery disease involving native coronary artery of native heart without angina pectoris   . H/O mitral valve  repair   . Tachypnea   . Paroxysmal atrial fibrillation (HCC)   . Hypokalemia   . Acute blood loss anemia   . Thrombocytopenia ([place])   . Dysphagia   . Dysarthria, post-stroke   . Dysphagia, post-stroke   . Acute respiratory failure ([place])   . Acute CVA (cerebrovascular accident) ([place])   . Cerebrovascular accident (CVA) due to thrombosis of precerebral artery ([place])   . Stroke (cerebrum) ([place])   . Arterial hypotension   . Hypocalcemia   . Scoliosis (and kyphoscoliosis), idiopathic 01/26/2014  . ROTATOR CUFF SYNDROME, LEFT 03/07/2010  . OTH MALIG NEOPLASM SKIN OTH&UNSPEC PARTS FACE 12/06/2009  . ACTINIC KERATOSIS, HEAD 09/06/2009  . Osteoarthritis 02/20/2009  . SYNCOPE, HX OF 02/20/2009  . MITRAL VALVE REPLACEMENT, HX OF 02/20/2009  . HIP REPLACEMENT, TOTAL, HX OF 02/20/2009  . APPENDECTOMY, HX OF 02/20/2009  . HERNIORRHAPHY, HX OF 02/20/2009  . EPISTAXIS, RECURRENT 11/19/2008  . UNS ADVRS EFF UNS RX MEDICINAL&BIOLOGICAL SBSTNC 06/12/2008  . OTHER AND UNSPECIFIED MITRAL VALVE DISEASES 03/12/2008  . ATRIAL FLUTTER 03/12/2008  . Essential hypertension 03/06/2007  . PEPTIC ULCER DISEASE 03/06/2007  . BENIGN PROSTATIC HYPERTROPHY 03/06/2007  . Coronary  atherosclerosis 02/13/2007  . ROSACEA 02/13/2007  . TRANSIENT ISCHEMIC ATTACK, HX OF 02/13/2007  . DVT, HX OF 02/13/2007    Jamey Reas PT, DPT 08/16/2016, 4:17 PM  [facility] [address] [place], [place], [address] Phone: [phone]   Fax:  [phone]  Name: Brian [name] MRN: [id number] Date of Birth: 1933-08-06

## 2016-08-18 NOTE — Patient Outreach (Signed)
[facility]) Care Management  08/18/2016  Brian [name] 22-Sep-1932 [id number]   mRS obtain at MD office visit with Dr. Leonie Man on 07/25/16  mRS=2  [name]  [facility] Care Management Assistant

## 2016-08-23 ENCOUNTER — Encounter: Payer: Self-pay | Admitting: Physical Therapy

## 2016-08-23 ENCOUNTER — Ambulatory Visit: Payer: Medicare Other | Admitting: Occupational Therapy

## 2016-08-23 ENCOUNTER — Ambulatory Visit: Payer: Medicare Other | Attending: Neurology | Admitting: Physical Therapy

## 2016-08-23 DIAGNOSIS — G8929 Other chronic pain: Secondary | ICD-10-CM | POA: Diagnosis not present

## 2016-08-23 DIAGNOSIS — M25512 Pain in left shoulder: Secondary | ICD-10-CM | POA: Diagnosis not present

## 2016-08-23 DIAGNOSIS — I69354 Hemiplegia and hemiparesis following cerebral infarction affecting left non-dominant side: Secondary | ICD-10-CM | POA: Diagnosis not present

## 2016-08-23 DIAGNOSIS — R278 Other lack of coordination: Secondary | ICD-10-CM

## 2016-08-23 DIAGNOSIS — R2681 Unsteadiness on feet: Secondary | ICD-10-CM | POA: Insufficient documentation

## 2016-08-23 DIAGNOSIS — R2689 Other abnormalities of gait and mobility: Secondary | ICD-10-CM | POA: Diagnosis not present

## 2016-08-23 DIAGNOSIS — R261 Paralytic gait: Secondary | ICD-10-CM | POA: Insufficient documentation

## 2016-08-23 DIAGNOSIS — M6281 Muscle weakness (generalized): Secondary | ICD-10-CM | POA: Insufficient documentation

## 2016-08-23 DIAGNOSIS — M25612 Stiffness of left shoulder, not elsewhere classified: Secondary | ICD-10-CM | POA: Diagnosis not present

## 2016-08-23 NOTE — Telephone Encounter (Signed)
Rn call [facility] for [place] about needing order for a brace for his left knee. Rn stated per Dr.Sethi he does not treat osteoarthritis.Rn stated a call was made on 08/07/2016 discussing this issue. Rn stated another fax was sent on 12/.26/2017. [facility] stated they receive the message and are forwarding it to the correct MD.

## 2016-08-23 NOTE — Therapy (Signed)
[facility] [address] [place], [place], [address] Phone: [phone]   Fax:  [phone]  Occupational Therapy Treatment  Patient Details  Name: Brian [name] MRN: [id number] Date of Birth: [birth year] Referring Provider: Dr. Leonie Man  Encounter Date: 08/23/2016      OT End of Session - 08/23/16 1429    Visit Number 2   Number of Visits 16   Date for OT Re-Evaluation 10/10/16   Authorization Type Medicare - pt will need G code and PN every 10th visit   Authorization Time Period 60 days   Authorization - Visit Number 2   Authorization - Number of Visits 10   OT Start Time 1318   OT Stop Time 1400   OT Time Calculation (min) 42 min      Past Medical History:  Diagnosis Date  . Anemia   . Atrial flutter ([place])   . BPH (benign prostatic hypertrophy)   . Epistaxis   . Hemorrhage of gastrointestinal tract, unspecified   . Hypertension   . Osteoarthrosis, unspecified whether generalized or localized, unspecified site   . Personal history of venous thrombosis and embolism   . Rosacea   . Stroke ([place])   . TIA (transient ischemic attack)     Past Surgical History:  Procedure Laterality Date  . APPENDECTOMY    . HERNIA REPAIR    . IR GENERIC HISTORICAL  05/04/2016   IR PERCUTANEOUS ART THROMBECTOMY/INFUSION INTRACRANIAL INC DIAG ANGIO 05/04/2016 [name], MD [facility]  . IR GENERIC HISTORICAL  05/04/2016   IR ANGIO VERTEBRAL SEL SUBCLAVIAN INNOMINATE UNI R MOD SED 05/04/2016 [name], MD [facility]  . IR GENERIC HISTORICAL  06/07/2016   IR RADIOLOGIST EVAL & MGMT 06/07/2016 [facility]  . JOINT REPLACEMENT    . MITRAL VALVE REPAIR    . mvp repair    . RADIOLOGY WITH ANESTHESIA N/A 05/04/2016   Procedure: RADIOLOGY WITH ANESTHESIA;  Surgeon: [name], MD;  Location: [facility];  Service: Radiology;  Laterality: N/A;  . TOTAL HIP ARTHROPLASTY      There were no vitals filed for this  visit.    Treatment: Pt arrived ambulating with walker pt required min A and mod v.c. For upright posture and increased step length with LLE. Supine closed chain shoulder flexion and chest press, min v.c./ min-mod facilitation Seated pt was instructed in basic fine motor coordination activities. See pt instructions.                          OT Short Term Goals - 08/15/16 1136      OT SHORT TERM GOAL #1   Title Pt and caregiver will be mod I with HEP - 09/12/2016   Status New     OT SHORT TERM GOAL #2   Title Pt will demonstrate improved grip strength by 2 pounds to assist with functional tasks. (baseline = 25 pounds)   Status New     OT SHORT TERM GOAL #3   Title Pt will require min a for tub bench transfers   Status New     OT SHORT TERM GOAL #4   Title Pt will be mod I to button large buttons    Status New     OT SHORT TERM GOAL #5   Title Pt will be able to don pants over feet with vc's only, AE as necessary   Status New  OT Long Term Goals - 08/15/16 1140      OT LONG TERM GOAL #1   Title Pt and caregiver will be mod I with upgraded HEP - 10/10/2016   Status New     OT LONG TERM GOAL #2   Title Pt will demonstrate improve grip strength by 5 pounds to assist with functional tasks (baseline = 25)   Status New     OT LONG TERM GOAL #3   Title Pt will improve on box and blocks by at least 5 blocks to indicate improve LUE function for tasks. (baseline = 15)   Status New     OT LONG TERM GOAL #4   Title Pt will be min a for LB dressing, AE prn   Status New     OT LONG TERM GOAL #5   Title Pt will be able to lift light weight object off overhead shelf x3 without dropping   Status New     OT LONG TERM GOAL #6   Title Pt will be mod I for zipping   Status New     OT LONG TERM GOAL #7   Title Pt will be able to use LUE as non dominant UE during basic ADL tasks.    Status New               Plan - 08/23/16 1426     Clinical Impression Statement Pt is progressing towards goals. His LUE functional use is limited by signifcant arthritic changes. Pt can benefit from continued skilled OT.   Rehab Potential Good   Clinical Impairments Affecting Rehab Potential significant arthritis and pain in muliple joints (back, hips, L shoulder), decreased activity tolerance   OT Frequency 2x / week   OT Duration 8 weeks   OT Treatment/Interventions Self-care/ADL training;Moist Heat;DME and/or AE instruction;Neuromuscular education;Therapeutic exercise;Functional Mobility Training;Manual Therapy;Passive range of motion;Splinting;Therapeutic activities;Balance training;Patient/family education   Plan progress HEP as able with arthritic changes in mind, add to coordination HEP.   Consulted and Agree with Plan of Care Patient      Patient will benefit from skilled therapeutic intervention in order to improve the following deficits and impairments:  Abnormal gait, Decreased activity tolerance, Decreased balance, Decreased coordination, Decreased knowledge of use of DME, Decreased mobility, Decreased range of motion, Difficulty walking, Decreased strength, Impaired UE functional use, Pain  Visit Diagnosis: Hemiplegia and hemiparesis following cerebral infarction affecting left non-dominant side (HCC)  Muscle weakness (generalized)  Other lack of coordination    Problem List Patient Active Problem List   Diagnosis Date Noted  . Cerebrovascular accident (CVA) due to thrombosis of right middle cerebral artery ([place])   . Benign essential HTN   . TIA (transient ischemic attack)   . Coronary artery disease involving native coronary artery of native heart without angina pectoris   . H/O mitral valve repair   . Tachypnea   . Paroxysmal atrial fibrillation (HCC)   . Hypokalemia   . Acute blood loss anemia   . Thrombocytopenia ([place])   . Dysphagia   . Dysarthria, post-stroke   . Dysphagia, post-stroke   . Acute respiratory  failure ([place])   . Acute CVA (cerebrovascular accident) ([place])   . Cerebrovascular accident (CVA) due to thrombosis of precerebral artery ([place])   . Stroke (cerebrum) ([place])   . Arterial hypotension   . Hypocalcemia   . Scoliosis (and kyphoscoliosis), idiopathic 01/26/2014  . ROTATOR CUFF SYNDROME, LEFT 03/07/2010  . [place] PARTS FACE  12/06/2009  . ACTINIC KERATOSIS, HEAD 09/06/2009  . Osteoarthritis 02/20/2009  . SYNCOPE, HX OF 02/20/2009  . MITRAL VALVE REPLACEMENT, HX OF 02/20/2009  . HIP REPLACEMENT, TOTAL, HX OF 02/20/2009  . APPENDECTOMY, HX OF 02/20/2009  . HERNIORRHAPHY, HX OF 02/20/2009  . EPISTAXIS, RECURRENT 11/19/2008  . UNS ADVRS EFF UNS RX MEDICINAL&BIOLOGICAL SBSTNC 06/12/2008  . OTHER AND UNSPECIFIED MITRAL VALVE DISEASES 03/12/2008  . ATRIAL FLUTTER 03/12/2008  . Essential hypertension 03/06/2007  . PEPTIC ULCER DISEASE 03/06/2007  . BENIGN PROSTATIC HYPERTROPHY 03/06/2007  . Coronary atherosclerosis 02/13/2007  . ROSACEA 02/13/2007  . TRANSIENT ISCHEMIC ATTACK, HX OF 02/13/2007  . DVT, HX OF 02/13/2007    Kapono Luhn 08/23/2016, 2:32 PM  [facility] [address] [place], [place], [address] Phone: [phone]   Fax:  [phone]  Name: JIAIR JOANIS MRN: [id number] Date of Birth: 1932-11-28

## 2016-08-23 NOTE — Patient Instructions (Signed)
  Coordination Activities  Perform the following activities for 10 minutes 2 times per day with left hand(s).   Flip cards 1 at a time as fast as you can.  Deal cards with your thumb (Hold deck in hand and push card off top with thumb).  Pick up coins, buttons, marbles, dried beans/pasta of different sizes and place in container.  Pick up coins and place in container or coin bank.  Pick up coins and stack.

## 2016-08-23 NOTE — Patient Instructions (Signed)
Heel Raises    Hold onto counter top for balance: lift heels up, hold them there for 5 seconds then slowly lower heels back down. Repeat for 10 reps. 1-2 times a day.   Side-Stepping    Holding onto counter for balance with TALL posture, side step toward one side and then side step back toward the other side. Repeat for 3 laps each way. 1-2 times a day.  Copyright  VHI. All rights reserved.   "I love a Youth worker onto counter top for balance: march forward along the counter top, then WALK (NO MARCHING) backwards to starting point. Repeat for 3 laps each way. 1-2 times a day.  Copyright  VHI. All rights reserved.

## 2016-08-24 ENCOUNTER — Encounter: Payer: Self-pay | Admitting: Occupational Therapy

## 2016-08-24 ENCOUNTER — Encounter: Payer: Self-pay | Admitting: Physical Therapy

## 2016-08-24 ENCOUNTER — Ambulatory Visit: Payer: Medicare Other | Admitting: Physical Therapy

## 2016-08-24 ENCOUNTER — Ambulatory Visit: Payer: Medicare Other | Admitting: Occupational Therapy

## 2016-08-24 VITALS — BP 138/77 | HR 79

## 2016-08-24 DIAGNOSIS — R278 Other lack of coordination: Secondary | ICD-10-CM

## 2016-08-24 DIAGNOSIS — R2681 Unsteadiness on feet: Secondary | ICD-10-CM

## 2016-08-24 DIAGNOSIS — M25512 Pain in left shoulder: Secondary | ICD-10-CM

## 2016-08-24 DIAGNOSIS — R261 Paralytic gait: Secondary | ICD-10-CM | POA: Diagnosis not present

## 2016-08-24 DIAGNOSIS — M6281 Muscle weakness (generalized): Secondary | ICD-10-CM | POA: Diagnosis not present

## 2016-08-24 DIAGNOSIS — M25612 Stiffness of left shoulder, not elsewhere classified: Secondary | ICD-10-CM

## 2016-08-24 DIAGNOSIS — I69354 Hemiplegia and hemiparesis following cerebral infarction affecting left non-dominant side: Secondary | ICD-10-CM

## 2016-08-24 DIAGNOSIS — R2689 Other abnormalities of gait and mobility: Secondary | ICD-10-CM | POA: Diagnosis not present

## 2016-08-24 DIAGNOSIS — G8929 Other chronic pain: Secondary | ICD-10-CM

## 2016-08-24 NOTE — Therapy (Signed)
[facility] [address] [place], [place], [address] Phone: [phone]   Fax:  [phone]  Physical Therapy Treatment  Patient Details  Name: Brian [name] MRN: [id number] Date of Birth: 27-Mar-1933 Referring Provider: Antony Contras, MD  Encounter Date: 08/23/2016      PT End of Session - 08/23/16 1409    Visit Number 2   Number of Visits 17   Date for PT Re-Evaluation 10/13/16   Authorization Type Medicare G-Code & progress note   PT Start Time 1403   PT Stop Time 1445   PT Time Calculation (min) 42 min   Equipment Utilized During Treatment Gait belt   Activity Tolerance Patient limited by fatigue;Patient tolerated treatment well   Behavior During Therapy [facility] for tasks assessed/performed      Past Medical History:  Diagnosis Date  . Anemia   . Atrial flutter ([place])   . BPH (benign prostatic hypertrophy)   . Epistaxis   . Hemorrhage of gastrointestinal tract, unspecified   . Hypertension   . Osteoarthrosis, unspecified whether generalized or localized, unspecified site   . Personal history of venous thrombosis and embolism   . Rosacea   . Stroke ([place])   . TIA (transient ischemic attack)     Past Surgical History:  Procedure Laterality Date  . APPENDECTOMY    . HERNIA REPAIR    . IR GENERIC HISTORICAL  05/04/2016   IR PERCUTANEOUS ART THROMBECTOMY/INFUSION INTRACRANIAL INC DIAG ANGIO 05/04/2016 [name], MD [facility]  . IR GENERIC HISTORICAL  05/04/2016   IR ANGIO VERTEBRAL SEL SUBCLAVIAN INNOMINATE UNI R MOD SED 05/04/2016 [name], MD [facility]  . IR GENERIC HISTORICAL  06/07/2016   IR RADIOLOGIST EVAL & MGMT 06/07/2016 [facility]  . JOINT REPLACEMENT    . MITRAL VALVE REPAIR    . mvp repair    . RADIOLOGY WITH ANESTHESIA N/A 05/04/2016   Procedure: RADIOLOGY WITH ANESTHESIA;  Surgeon: [name], MD;  Location: [facility];  Service: Radiology;  Laterality: N/A;  . TOTAL  HIP ARTHROPLASTY      There were no vitals filed for this visit.      Subjective Assessment - 08/23/16 1406    Patient is accompained by: --  CNA in lobby   Limitations Lifting;Standing;Walking   Patient Stated Goals To get balance & walking back to pre stroke level   Currently in Pain? Yes   Pain Score 6    Pain Location Generalized  knees, shoulder, back OA   Pain Orientation Right;Left   Pain Descriptors / Indicators Aching;Sore   Pain Type Chronic pain   Pain Onset More than a month ago   Pain Frequency Constant   Aggravating Factors  walking, increased activity, increased immobility   Pain Relieving Factors laying down, tylenol     treatment: Issued the following to pt's HEP Heel Raises    Hold onto counter top for balance: lift heels up, hold them there for 5 seconds then slowly lower heels back down. Repeat for 10 reps. 1-2 times a day.   Side-Stepping    Holding onto counter for balance with TALL posture, side step toward one side and then side step back toward the other side. Repeat for 3 laps each way. 1-2 times a day.  Copyright  VHI. All rights reserved.   "I love a Youth worker onto counter top for balance: march forward along the counter top, then WALK (NO MARCHING) backwards to  starting point. Repeat for 3 laps each way. 1-2 times a day.  Copyright  VHI. All rights reserved.   Other Exercises: Nustep with all extremities level 1.0 x 6 minutes with goal >/= 30 steps per minute for strengthening and activity tolerance            PT Short Term Goals - 08/15/16 1610      PT SHORT TERM GOAL #1   Title Patient demonstrates understanding of initial HEP with CNA cueing correctly.  (Target Date: 09/15/2016)   Time 4   Period Weeks   Status New     PT SHORT TERM GOAL #2   Title Patient reaches 7" anteriorly and within 10" of floor with RW support with supervision. (Target Date: 09/15/2016)   Time 4   Period Weeks   Status New       PT SHORT TERM GOAL #3   Title Patient ambulates 200' with RW with supervision. (Target Date: 09/15/2016)   Time 4   Period Weeks   Status New     PT SHORT TERM GOAL #4   Title Patient negotiates ramps & curbs with RW with minimal guard. (Target Date: 09/15/2016)   Time 4   Period Weeks   Status New           PT Long Term Goals - 08/15/16 1601      PT LONG TERM GOAL #1   Title Patient & CNA demonstrate understanding of HEP. (Target Date: 10/13/2016)   Time 8   Period Weeks   Status New     PT LONG TERM GOAL #2   Title Patient able to balance in standing with RW support reaching 10" and manages clothes safely.  (Target Date: 10/13/2016)    Time 8   Period Weeks   Status New     PT LONG TERM GOAL #3   Title Patient ambulates 300' with RW with supervision for community mobility  (Target Date: 10/13/2016)   Time 8   Period Weeks   Status New     PT LONG TERM GOAL #4   Title Patient negotiates ramps & curbs with RW and stairs with 2 rails with supervision for community access.  (Target Date: 10/13/2016)   Time 8   Period Weeks   Status New     PT LONG TERM GOAL #5   Title Patient ambulates with RW around furniture 100' modified independent for household mobility.  (Target Date: 10/13/2016)   Time 8   Period Weeks   Status New           Plan - 08/23/16 1409    Clinical Impression Statement Issued HEP for balance/strengthening for home during todays session. Pt needs constant cues for posture, min cues for ex form/technique. Pt should benefit from continued PT to progress toward unmet goals   Rehab Potential Good   PT Frequency 2x / week   PT Duration 8 weeks   PT Treatment/Interventions ADLs/Self Care Home Management;DME Instruction;Gait training;Stair training;Functional mobility training;Therapeutic activities;Therapeutic exercise;Balance training;Neuromuscular re-education;Patient/family education   PT Next Visit Plan continue to work toward goals, trial  rollator vs RW as pt reports he used rollator at home prior to stroke   Consulted and Agree with Plan of Care Patient;Family member/caregiver   Family Member Consulted CNA      Patient will benefit from skilled therapeutic intervention in order to improve the following deficits and impairments:  Abnormal gait, Decreased activity tolerance, Decreased balance, Decreased coordination, Decreased knowledge of use  of DME, Decreased mobility, Decreased range of motion, Decreased strength, Postural dysfunction, Pain  Visit Diagnosis: Hemiplegia and hemiparesis following cerebral infarction affecting left non-dominant side (HCC)  Muscle weakness (generalized)  Other abnormalities of gait and mobility  Other lack of coordination  Unsteadiness on feet     Problem List Patient Active Problem List   Diagnosis Date Noted  . Cerebrovascular accident (CVA) due to thrombosis of right middle cerebral artery ([place])   . Benign essential HTN   . TIA (transient ischemic attack)   . Coronary artery disease involving native coronary artery of native heart without angina pectoris   . H/O mitral valve repair   . Tachypnea   . Paroxysmal atrial fibrillation (HCC)   . Hypokalemia   . Acute blood loss anemia   . Thrombocytopenia ([place])   . Dysphagia   . Dysarthria, post-stroke   . Dysphagia, post-stroke   . Acute respiratory failure ([place])   . Acute CVA (cerebrovascular accident) ([place])   . Cerebrovascular accident (CVA) due to thrombosis of precerebral artery ([place])   . Stroke (cerebrum) ([place])   . Arterial hypotension   . Hypocalcemia   . Scoliosis (and kyphoscoliosis), idiopathic 01/26/2014  . ROTATOR CUFF SYNDROME, LEFT 03/07/2010  . OTH MALIG NEOPLASM SKIN OTH&UNSPEC PARTS FACE 12/06/2009  . ACTINIC KERATOSIS, HEAD 09/06/2009  . Osteoarthritis 02/20/2009  . SYNCOPE, HX OF 02/20/2009  . MITRAL VALVE REPLACEMENT, HX OF 02/20/2009  . HIP REPLACEMENT, TOTAL, HX OF 02/20/2009  . APPENDECTOMY, HX OF  02/20/2009  . HERNIORRHAPHY, HX OF 02/20/2009  . EPISTAXIS, RECURRENT 11/19/2008  . UNS ADVRS EFF UNS RX MEDICINAL&BIOLOGICAL SBSTNC 06/12/2008  . OTHER AND UNSPECIFIED MITRAL VALVE DISEASES 03/12/2008  . ATRIAL FLUTTER 03/12/2008  . Essential hypertension 03/06/2007  . PEPTIC ULCER DISEASE 03/06/2007  . BENIGN PROSTATIC HYPERTROPHY 03/06/2007  . Coronary atherosclerosis 02/13/2007  . ROSACEA 02/13/2007  . TRANSIENT ISCHEMIC ATTACK, HX OF 02/13/2007  . DVT, HX OF 02/13/2007    Willow Ora, PTA, [facility] [address], [place], [place] [address] [phone] 08/24/16, 7:32 AM   Name: MANKIRAT IGLESIAS MRN: [id number] Date of Birth: [birth year]

## 2016-08-24 NOTE — Patient Instructions (Signed)
1. Grip Strengthening (Resistive Putty)  YELLOW PUTTY   Squeeze putty using thumb and all fingers. Repeat _10___ times. Do __2__ sessions per day.  DO NOT DO MORE THAN WE ARE ASKING YOU TO DO!!! We need to go slow with strengthening your hand so we don't flair up your arthritis.     Coordination Activities  Perform the following activities for 15 minutes 2  times per day with left hand(s).   Toss ball between hands.  Toss ball in air and catch with the same hand.  Flip cards 1 at a time as fast as you can.  Pick up coins, buttons, marbles, dried beans/pasta of different sizes and place in container.  Pick up coins and place in container or coin bank.  Screw together nuts and bolts, then unfasten.     Copyright  VHI. All rights reserved.

## 2016-08-24 NOTE — Therapy (Signed)
[facility] [address] [place], [place], [address] Phone: [phone]   Fax:  [phone]  Occupational Therapy Treatment  Patient Details  Name: Brian [name] MRN: [id number] Date of Birth: Aug 21, 1933 Referring Provider: Dr. Leonie Man  Encounter Date: 08/24/2016      OT End of Session - 08/24/16 1602    Visit Number 3   Number of Visits 16   Date for OT Re-Evaluation 10/10/16   Authorization Type Medicare - pt will need G code and PN every 10th visit   Authorization Time Period 60 days   Authorization - Visit Number 3   Authorization - Number of Visits 10   OT Start Time 1316   OT Stop Time 1358   OT Time Calculation (min) 42 min   Activity Tolerance Patient tolerated treatment well      Past Medical History:  Diagnosis Date  . Anemia   . Atrial flutter ([place])   . BPH (benign prostatic hypertrophy)   . Epistaxis   . Hemorrhage of gastrointestinal tract, unspecified   . Hypertension   . Osteoarthrosis, unspecified whether generalized or localized, unspecified site   . Personal history of venous thrombosis and embolism   . Rosacea   . Stroke ([place])   . TIA (transient ischemic attack)     Past Surgical History:  Procedure Laterality Date  . APPENDECTOMY    . HERNIA REPAIR    . IR GENERIC HISTORICAL  05/04/2016   IR PERCUTANEOUS ART THROMBECTOMY/INFUSION INTRACRANIAL INC DIAG ANGIO 05/04/2016 [name], MD [facility]  . IR GENERIC HISTORICAL  05/04/2016   IR ANGIO VERTEBRAL SEL SUBCLAVIAN INNOMINATE UNI R MOD SED 05/04/2016 [name], MD [facility]  . IR GENERIC HISTORICAL  06/07/2016   IR RADIOLOGIST EVAL & MGMT 06/07/2016 [facility]  . JOINT REPLACEMENT    . MITRAL VALVE REPAIR    . mvp repair    . RADIOLOGY WITH ANESTHESIA N/A 05/04/2016   Procedure: RADIOLOGY WITH ANESTHESIA;  Surgeon: [name], MD;  Location: [facility];  Service: Radiology;  Laterality: N/A;  . TOTAL HIP  ARTHROPLASTY      Vitals:   08/24/16 1324  BP: 138/77  Pulse: 79        Subjective Assessment - 08/24/16 1324    Subjective  I fell on my way into the lobby today can you take my BP?   Pertinent History see epic    Patient Stated Goals I want to go back to work.                       OT Treatments/Exercises (OP) - 08/24/16 0001      Fine Motor Coordination   Other Fine Motor Exercises Reviewed HEP for fine motor and gave pt additional activties to do at home. Also instructedpt with yellow putty to address grip strength. Pt and caregiver instructed to go slowly and complete no more reps than indicated on HEP. Pt and caregiver also instructed to stop putty if pt has any hand pain given arthritis.  Practiced medium size pegs in peg board to address picking up smaller items, in hand manipulation and coordination. Pt completed with minimal difficulty.                  OT Education - 08/24/16 1355    Education provided Yes   Education Details Yellow putty to work on grip strength, expanded fine motor HEP   Person(s) Educated Patient;Caregiver(s)  Methods Explanation;Demonstration;Verbal cues;Handout   Comprehension Verbalized understanding;Returned demonstration  with cues.  Caregiver [name] to assist prn          OT Short Term Goals - 08/24/16 1559      OT SHORT TERM GOAL #1   Title Pt and caregiver will be mod I with HEP - 09/12/2016   Status On-going     OT SHORT TERM GOAL #2   Title Pt will demonstrate improved grip strength by 2 pounds to assist with functional tasks. (baseline = 25 pounds)   Status On-going     OT SHORT TERM GOAL #3   Title Pt will require min a for tub bench transfers   Status On-going     OT SHORT TERM GOAL #4   Title Pt will be mod I to button large buttons    Status On-going     OT SHORT TERM GOAL #5   Title Pt will be able to don pants over feet with vc's only, AE as necessary   Status On-going           OT  Long Term Goals - 08/24/16 1559      OT LONG TERM GOAL #1   Title Pt and caregiver will be mod I with upgraded HEP - 10/10/2016   Status On-going     OT LONG TERM GOAL #2   Title Pt will demonstrate improve grip strength by 5 pounds to assist with functional tasks (baseline = 25)   Status On-going     OT LONG TERM GOAL #3   Title Pt will improve on box and blocks by at least 5 blocks to indicate improve LUE function for tasks. (baseline = 15)   Status On-going     OT LONG TERM GOAL #4   Title Pt will be min a for LB dressing, AE prn   Status On-going     OT LONG TERM GOAL #5   Title Pt will be able to lift light weight object off overhead shelf x3 without dropping   Status On-going     OT LONG TERM GOAL #6   Title Pt will be mod I for zipping   Status On-going     OT LONG TERM GOAL #7   Title Pt will be able to use LUE as non dominant UE during basic ADL tasks.    Status On-going               Plan - 08/24/16 1600    Clinical Impression Statement Pt with slow progress toward goals. Pt requires increased time and repetition for tasks.     Clinical Impairments Affecting Rehab Potential significant arthritis and pain in muliple joints (back, hips, L shoulder), decreased activity tolerance   OT Frequency 2x / week   OT Duration 8 weeks   OT Treatment/Interventions Self-care/ADL training;Moist Heat;DME and/or AE instruction;Neuromuscular education;Therapeutic exercise;Functional Mobility Training;Manual Therapy;Passive range of motion;Splinting;Therapeutic activities;Balance training;Patient/family education   Plan check HEP, gentle strengthening of L hand, functional coordination of L hand, bialteral use of hands, address ADL stg's   Consulted and Agree with Plan of Care Patient      Patient will benefit from skilled therapeutic intervention in order to improve the following deficits and impairments:  Abnormal gait, Decreased activity tolerance, Decreased balance,  Decreased coordination, Decreased knowledge of use of DME, Decreased mobility, Decreased range of motion, Difficulty walking, Decreased strength, Impaired UE functional use, Pain  Visit Diagnosis: Hemiplegia and hemiparesis following cerebral infarction affecting left non-dominant  side ([place])  Muscle weakness (generalized)  Other lack of coordination  Unsteadiness on feet  Chronic left shoulder pain  Stiffness of left shoulder, not elsewhere classified    Problem List Patient Active Problem List   Diagnosis Date Noted  . Cerebrovascular accident (CVA) due to thrombosis of right middle cerebral artery ([place])   . Benign essential HTN   . TIA (transient ischemic attack)   . Coronary artery disease involving native coronary artery of native heart without angina pectoris   . H/O mitral valve repair   . Tachypnea   . Paroxysmal atrial fibrillation (HCC)   . Hypokalemia   . Acute blood loss anemia   . Thrombocytopenia ([place])   . Dysphagia   . Dysarthria, post-stroke   . Dysphagia, post-stroke   . Acute respiratory failure ([place])   . Acute CVA (cerebrovascular accident) ([place])   . Cerebrovascular accident (CVA) due to thrombosis of precerebral artery ([place])   . Stroke (cerebrum) ([place])   . Arterial hypotension   . Hypocalcemia   . Scoliosis (and kyphoscoliosis), idiopathic 01/26/2014  . ROTATOR CUFF SYNDROME, LEFT 03/07/2010  . OTH MALIG NEOPLASM SKIN OTH&UNSPEC PARTS FACE 12/06/2009  . ACTINIC KERATOSIS, HEAD 09/06/2009  . Osteoarthritis 02/20/2009  . SYNCOPE, HX OF 02/20/2009  . MITRAL VALVE REPLACEMENT, HX OF 02/20/2009  . HIP REPLACEMENT, TOTAL, HX OF 02/20/2009  . APPENDECTOMY, HX OF 02/20/2009  . HERNIORRHAPHY, HX OF 02/20/2009  . EPISTAXIS, RECURRENT 11/19/2008  . UNS ADVRS EFF UNS RX MEDICINAL&BIOLOGICAL SBSTNC 06/12/2008  . OTHER AND UNSPECIFIED MITRAL VALVE DISEASES 03/12/2008  . ATRIAL FLUTTER 03/12/2008  . Essential hypertension 03/06/2007  . PEPTIC ULCER DISEASE  03/06/2007  . BENIGN PROSTATIC HYPERTROPHY 03/06/2007  . Coronary atherosclerosis 02/13/2007  . ROSACEA 02/13/2007  . TRANSIENT ISCHEMIC ATTACK, HX OF 02/13/2007  . DVT, HX OF 02/13/2007    Quay Burow, OTR/L 08/24/2016, 4:03 PM  [facility] [address] [place] [address] [place], [place], [address] Phone: [phone]   Fax:  [phone]  Name: Brian [name] MRN: [id number] Date of Birth: [birth year]

## 2016-08-28 ENCOUNTER — Other Ambulatory Visit: Payer: Self-pay | Admitting: Internal Medicine

## 2016-08-28 ENCOUNTER — Telehealth: Payer: Self-pay | Admitting: Physical Therapy

## 2016-08-28 NOTE — Telephone Encounter (Signed)
Katrina Dr. Leonie Man nurse will speak to Dr. Charlie Pitter order and get back with you thanks Hinton Dyer.

## 2016-08-28 NOTE — Therapy (Signed)
[facility] [address] [place], [place], [address] Phone: [phone]   Fax:  [phone]  Physical Therapy Treatment  Patient Details  Name: Brian [name] MRN: [id number] Date of Birth: 09-10-1932 Referring Provider: Antony Contras, MD  Encounter Date: 08/24/2016     08/24/16 1554  PT Visits / Re-Eval  Visit Number 3  Number of Visits 17  Date for PT Re-Evaluation 10/13/16  Authorization  Authorization Type Medicare G-Code & progress note  PT Time Calculation  PT Start Time 1400  PT Stop Time 1444  PT Time Calculation (min) 44 min  PT - End of Session  Equipment Utilized During Treatment Gait belt  Activity Tolerance Patient limited by fatigue;Patient tolerated treatment well  Behavior During Therapy [facility] for tasks assessed/performed    Past Medical History:  Diagnosis Date  . Anemia   . Atrial flutter ([place])   . BPH (benign prostatic hypertrophy)   . Epistaxis   . Hemorrhage of gastrointestinal tract, unspecified   . Hypertension   . Osteoarthrosis, unspecified whether generalized or localized, unspecified site   . Personal history of venous thrombosis and embolism   . Rosacea   . Stroke ([place])   . TIA (transient ischemic attack)     Past Surgical History:  Procedure Laterality Date  . APPENDECTOMY    . HERNIA REPAIR    . IR GENERIC HISTORICAL  05/04/2016   IR PERCUTANEOUS ART THROMBECTOMY/INFUSION INTRACRANIAL INC DIAG ANGIO 05/04/2016 [name], MD [facility]  . IR GENERIC HISTORICAL  05/04/2016   IR ANGIO VERTEBRAL SEL SUBCLAVIAN INNOMINATE UNI R MOD SED 05/04/2016 [name], MD [facility]  . IR GENERIC HISTORICAL  06/07/2016   IR RADIOLOGIST EVAL & MGMT 06/07/2016 [facility]  . JOINT REPLACEMENT    . MITRAL VALVE REPAIR    . mvp repair    . RADIOLOGY WITH ANESTHESIA N/A 05/04/2016   Procedure: RADIOLOGY WITH ANESTHESIA;  Surgeon: [name], MD;  Location: [facility];   Service: Radiology;  Laterality: N/A;  . TOTAL HIP ARTHROPLASTY      There were no vitals filed for this visit.     08/24/16 1400  Symptoms/Limitations  Subjective He fell entering [facility] He was walking with RW & aide when he was coming thru doorway and turning to the left.   Patient is accompained by: (CNA)  Limitations Lifting;Standing;Walking  Patient Stated Goals To get balance & walking back to pre stroke level  Pain Assessment  Currently in Pain? Yes  Pain Location Other (Comment) (generalized with arthritis)  Pain Orientation Right;Left  Pain Descriptors / Indicators Aching;Sore  Pain Type Chronic pain  Pain Onset More than a month ago  Pain Frequency Constant  Aggravating Factors  walking, increased activity  Pain Relieving Factors laying down, tylenol    Therapeutic Exercise: NuStep Level 2 with BUEs & BLEs X 15 min while PT instructed pt & CNA in signs of SDH with fall today. CNA verbalized understanding.   Gait Training: PT put colored theraband on anterior RW for visual cues / target for step length with step thru pattern. Pt ambulated 45' X 3 with RW with verbal, tactile & visual cues on step length, position within RW & posture with minA.                          08/24/16 1556  Plan  Clinical Impression Statement Patient does not appear to have any injuries  from his fall today. His CNA is aware of symptoms for which to observe. Pt's gait improved with visual cue of theraband to facilitate longer step and positioning within RW.   Pt will benefit from skilled therapeutic intervention in order to improve on the following deficits Abnormal gait;Decreased activity tolerance;Decreased balance;Decreased coordination;Decreased knowledge of use of DME;Decreased mobility;Decreased range of motion;Decreased strength;Postural dysfunction;Pain  Rehab Potential Good  PT Frequency 2x / week  PT Duration 8 weeks  PT Treatment/Interventions  ADLs/Self Care Home Management;DME Instruction;Gait training;Stair training;Functional mobility training;Therapeutic activities;Therapeutic exercise;Balance training;Neuromuscular re-education;Patient/family education  PT Next Visit Plan continue to work toward goals, trial rollator vs RW as pt reports he used rollator at home prior to stroke  Consulted and Agree with Plan of Care Patient;Family member/caregiver  Family Member Consulted CNA        PT Short Term Goals - 08/24/16 1556      PT SHORT TERM GOAL #1   Title Patient demonstrates understanding of initial HEP with CNA cueing correctly.  (Target Date: 09/15/2016)   Time 4   Period Weeks   Status On-going     PT SHORT TERM GOAL #2   Title Patient reaches 7" anteriorly and within 10" of floor with RW support with supervision. (Target Date: 09/15/2016)   Time 4   Period Weeks   Status On-going     PT SHORT TERM GOAL #3   Title Patient ambulates 200' with RW with supervision. (Target Date: 09/15/2016)   Time 4   Period Weeks   Status On-going     PT SHORT TERM GOAL #4   Title Patient negotiates ramps & curbs with RW with minimal guard. (Target Date: 09/15/2016)   Time 4   Period Weeks   Status On-going           PT Long Term Goals - 08/24/16 1556      PT LONG TERM GOAL #1   Title Patient & CNA demonstrate understanding of HEP. (Target Date: 10/13/2016)   Time 8   Period Weeks   Status On-going     PT LONG TERM GOAL #2   Title Patient able to balance in standing with RW support reaching 10" and manages clothes safely.  (Target Date: 10/13/2016)    Time 8   Period Weeks   Status On-going     PT LONG TERM GOAL #3   Title Patient ambulates 300' with RW with supervision for community mobility  (Target Date: 10/13/2016)   Time 8   Period Weeks   Status On-going     PT LONG TERM GOAL #4   Title Patient negotiates ramps & curbs with RW and stairs with 2 rails with supervision for community access.  (Target Date:  10/13/2016)   Time 8   Period Weeks   Status On-going     PT LONG TERM GOAL #5   Title Patient ambulates with RW around furniture 100' modified independent for household mobility.  (Target Date: 10/13/2016)   Time 8   Period Weeks   Status On-going             Patient will benefit from skilled therapeutic intervention in order to improve the following deficits and impairments:  Abnormal gait, Decreased activity tolerance, Decreased balance, Decreased coordination, Decreased knowledge of use of DME, Decreased mobility, Decreased range of motion, Decreased strength, Postural dysfunction, Pain  Visit Diagnosis: Hemiplegia and hemiparesis following cerebral infarction affecting left non-dominant side (HCC)  Muscle weakness (generalized)  Other lack of coordination  Other abnormalities of gait and mobility  Unsteadiness on feet  Paralytic gait     Problem List Patient Active Problem List   Diagnosis Date Noted  . Cerebrovascular accident (CVA) due to thrombosis of right middle cerebral artery ([place])   . Benign essential HTN   . TIA (transient ischemic attack)   . Coronary artery disease involving native coronary artery of native heart without angina pectoris   . H/O mitral valve repair   . Tachypnea   . Paroxysmal atrial fibrillation (HCC)   . Hypokalemia   . Acute blood loss anemia   . Thrombocytopenia ([place])   . Dysphagia   . Dysarthria, post-stroke   . Dysphagia, post-stroke   . Acute respiratory failure ([place])   . Acute CVA (cerebrovascular accident) ([place])   . Cerebrovascular accident (CVA) due to thrombosis of precerebral artery ([place])   . Stroke (cerebrum) ([place])   . Arterial hypotension   . Hypocalcemia   . Scoliosis (and kyphoscoliosis), idiopathic 01/26/2014  . ROTATOR CUFF SYNDROME, LEFT 03/07/2010  . OTH MALIG NEOPLASM SKIN OTH&UNSPEC PARTS FACE 12/06/2009  . ACTINIC KERATOSIS, HEAD 09/06/2009  . Osteoarthritis 02/20/2009  . SYNCOPE, HX OF 02/20/2009   . MITRAL VALVE REPLACEMENT, HX OF 02/20/2009  . HIP REPLACEMENT, TOTAL, HX OF 02/20/2009  . APPENDECTOMY, HX OF 02/20/2009  . HERNIORRHAPHY, HX OF 02/20/2009  . EPISTAXIS, RECURRENT 11/19/2008  . UNS ADVRS EFF UNS RX MEDICINAL&BIOLOGICAL SBSTNC 06/12/2008  . OTHER AND UNSPECIFIED MITRAL VALVE DISEASES 03/12/2008  . ATRIAL FLUTTER 03/12/2008  . Essential hypertension 03/06/2007  . PEPTIC ULCER DISEASE 03/06/2007  . BENIGN PROSTATIC HYPERTROPHY 03/06/2007  . Coronary atherosclerosis 02/13/2007  . ROSACEA 02/13/2007  . TRANSIENT ISCHEMIC ATTACK, HX OF 02/13/2007  . DVT, HX OF 02/13/2007    Jamey Reas PT, DPT 08/28/2016, 6:00 AM  [facility] [address] [place], [place], [address] Phone: [phone]   Fax:  [phone]  Name: Brian [name] MRN: [id number] Date of Birth: August 24, 1932

## 2016-08-28 NOTE — Telephone Encounter (Signed)
[name] has severe osteoarthritis and recent CVA. He was discharged from hospital without a wheelchair and needs one now to safely go out into the community.  Can you please write an order in Epic or prescription FAXd to [phone] for K3 Lightweight wheelchair? Thank you Jamey Reas, PT, DPT Phone [phone] if have questions.

## 2016-08-29 ENCOUNTER — Encounter: Payer: Self-pay | Admitting: Physical Therapy

## 2016-08-29 ENCOUNTER — Ambulatory Visit: Payer: Medicare Other | Admitting: Occupational Therapy

## 2016-08-29 ENCOUNTER — Ambulatory Visit: Payer: Medicare Other | Admitting: Physical Therapy

## 2016-08-29 ENCOUNTER — Encounter: Payer: Self-pay | Admitting: Occupational Therapy

## 2016-08-29 DIAGNOSIS — R261 Paralytic gait: Secondary | ICD-10-CM | POA: Diagnosis not present

## 2016-08-29 DIAGNOSIS — I69354 Hemiplegia and hemiparesis following cerebral infarction affecting left non-dominant side: Secondary | ICD-10-CM | POA: Diagnosis not present

## 2016-08-29 DIAGNOSIS — G8929 Other chronic pain: Secondary | ICD-10-CM

## 2016-08-29 DIAGNOSIS — R2689 Other abnormalities of gait and mobility: Secondary | ICD-10-CM

## 2016-08-29 DIAGNOSIS — M6281 Muscle weakness (generalized): Secondary | ICD-10-CM | POA: Diagnosis not present

## 2016-08-29 DIAGNOSIS — R278 Other lack of coordination: Secondary | ICD-10-CM

## 2016-08-29 DIAGNOSIS — R2681 Unsteadiness on feet: Secondary | ICD-10-CM

## 2016-08-29 DIAGNOSIS — M25512 Pain in left shoulder: Secondary | ICD-10-CM

## 2016-08-29 DIAGNOSIS — M25612 Stiffness of left shoulder, not elsewhere classified: Secondary | ICD-10-CM

## 2016-08-29 NOTE — Therapy (Signed)
[facility] [address] [place], [place], [address] Phone: [phone]   Fax:  [phone]  Occupational Therapy Treatment  Patient Details  Name: Brian [name] MRN: [id number] Date of Birth: 01/24/1933 Referring Provider: Dr. Leonie Man  Encounter Date: 08/29/2016      OT End of Session - 08/29/16 1304    Visit Number 4   Number of Visits 16   Date for OT Re-Evaluation 10/10/16   Authorization Type Medicare - pt will need G code and PN every 10th visit   Authorization Time Period 60 days   Authorization - Visit Number 4   Authorization - Number of Visits 10   OT Start Time 1146   OT Stop Time 1228   OT Time Calculation (min) 42 min      Past Medical History:  Diagnosis Date  . Anemia   . Atrial flutter ([place])   . BPH (benign prostatic hypertrophy)   . Epistaxis   . Hemorrhage of gastrointestinal tract, unspecified   . Hypertension   . Osteoarthrosis, unspecified whether generalized or localized, unspecified site   . Personal history of venous thrombosis and embolism   . Rosacea   . Stroke ([place])   . TIA (transient ischemic attack)     Past Surgical History:  Procedure Laterality Date  . APPENDECTOMY    . HERNIA REPAIR    . IR GENERIC HISTORICAL  05/04/2016   IR PERCUTANEOUS ART THROMBECTOMY/INFUSION INTRACRANIAL INC DIAG ANGIO 05/04/2016 [name], MD [facility]  . IR GENERIC HISTORICAL  05/04/2016   IR ANGIO VERTEBRAL SEL SUBCLAVIAN INNOMINATE UNI R MOD SED 05/04/2016 [name], MD [facility]  . IR GENERIC HISTORICAL  06/07/2016   IR RADIOLOGIST EVAL & MGMT 06/07/2016 [facility]  . JOINT REPLACEMENT    . MITRAL VALVE REPAIR    . mvp repair    . RADIOLOGY WITH ANESTHESIA N/A 05/04/2016   Procedure: RADIOLOGY WITH ANESTHESIA;  Surgeon: [name], MD;  Location: [facility];  Service: Radiology;  Laterality: N/A;  . TOTAL HIP ARTHROPLASTY      There were no vitals filed for this  visit.      Subjective Assessment - 08/29/16 1155    Subjective  My confidence is not as good since I fell   Patient is accompained by: --  caregiver [place]   Pertinent History see epic    Patient Stated Goals I want to go back to work.    Currently in Pain? Yes   Pain Location --  generalized pain from arthritis - knees, hips, back, toes, shoulders   Pain Orientation Left   Pain Descriptors / Indicators Aching;Sore   Pain Type Chronic pain   Pain Onset More than a month ago   Pain Frequency Constant   Aggravating Factors  walking, increased activity   Pain Relieving Factors laying down, tylenol                      OT Treatments/Exercises (OP) - 08/29/16 0001      ADLs   LB Dressing With strategies pt is able to don and doff pants, socks and shoes. Pt is able to pull pants up and down.  Pt does need assist for tying shoes only - AE not appropriate.     Functional Mobility Practiced transfer tub bench transfers .  WIth cueing for set up pt is able to complete in and out with close supervision and vc's.  Encourged pt to  lift own legs into tub and to stand and sit without physical assistance to get stronger. Pt is aware he should always have caregiver with him and she should provide close supevision for balance and cues.    ADL Comments Pt's caregiver not present during session.  All instructions written down and provided to pt to give to caregiver - see pt instruction for details.                  OT Education - 08/29/16 1302    Education provided Yes   Education Details tub transfers, LB dressing   Person(s) Educated Patient   Methods Explanation;Demonstration;Verbal cues;Handout   Comprehension Verbalized understanding;Returned demonstration          OT Short Term Goals - 08/29/16 1302      OT SHORT TERM GOAL #1   Title Pt and caregiver will be mod I with HEP - 09/12/2016   Status On-going     OT SHORT TERM GOAL #2   Title Pt will demonstrate  improved grip strength by 2 pounds to assist with functional tasks. (baseline = 25 pounds)   Status On-going     OT SHORT TERM GOAL #3   Title Pt will require min a for tub bench transfers   Status Achieved     OT SHORT TERM GOAL #4   Title Pt will be mod I to button large buttons    Status On-going     OT SHORT TERM GOAL #5   Title Pt will be able to don pants over feet with vc's only, AE as necessary   Status Achieved           OT Long Term Goals - 08/29/16 1303      OT LONG TERM GOAL #1   Title Pt and caregiver will be mod I with upgraded HEP - 10/10/2016   Status On-going     OT LONG TERM GOAL #2   Title Pt will demonstrate improve grip strength by 5 pounds to assist with functional tasks (baseline = 25)   Status On-going     OT LONG TERM GOAL #3   Title Pt will improve on box and blocks by at least 5 blocks to indicate improve LUE function for tasks. (baseline = 15)   Status On-going     OT LONG TERM GOAL #4   Title Pt will be min a for LB dressing, AE prn   Status On-going     OT LONG TERM GOAL #5   Title Pt will be able to lift light weight object off overhead shelf x3 without dropping   Status On-going     OT LONG TERM GOAL #6   Title Pt will be mod I for zipping   Status On-going     OT LONG TERM GOAL #7   Title Pt will be able to use LUE as non dominant UE during basic ADL tasks.    Status On-going               Plan - 08/29/16 1303    Clinical Impression Statement Pt with slow progress toward goals.  Pt needs mod- max vc's for functional mobility.    Rehab Potential Good   Clinical Impairments Affecting Rehab Potential significant arthritis and pain in muliple joints (back, hips, L shoulder), decreased activity tolerance   OT Frequency 2x / week   OT Duration 8 weeks   OT Treatment/Interventions Self-care/ADL training;Moist Heat;DME and/or AE instruction;Neuromuscular education;Therapeutic exercise;Functional Mobility  Training;Manual  Therapy;Passive range of motion;Splinting;Therapeutic activities;Balance training;Patient/family education   Plan check grip strength, coordination tasks, bilateral use of hands   Consulted and Agree with Plan of Care Patient      Patient will benefit from skilled therapeutic intervention in order to improve the following deficits and impairments:  Abnormal gait, Decreased activity tolerance, Decreased balance, Decreased coordination, Decreased knowledge of use of DME, Decreased mobility, Decreased range of motion, Difficulty walking, Decreased strength, Impaired UE functional use, Pain  Visit Diagnosis: Hemiplegia and hemiparesis following cerebral infarction affecting left non-dominant side (HCC)  Unsteadiness on feet  Muscle weakness (generalized)  Other lack of coordination  Chronic left shoulder pain  Stiffness of left shoulder, not elsewhere classified    Problem List Patient Active Problem List   Diagnosis Date Noted  . Cerebrovascular accident (CVA) due to thrombosis of right middle cerebral artery ([place])   . Benign essential HTN   . TIA (transient ischemic attack)   . Coronary artery disease involving native coronary artery of native heart without angina pectoris   . H/O mitral valve repair   . Tachypnea   . Paroxysmal atrial fibrillation (HCC)   . Hypokalemia   . Acute blood loss anemia   . Thrombocytopenia ([place])   . Dysphagia   . Dysarthria, post-stroke   . Dysphagia, post-stroke   . Acute respiratory failure ([place])   . Acute CVA (cerebrovascular accident) ([place])   . Cerebrovascular accident (CVA) due to thrombosis of precerebral artery ([place])   . Stroke (cerebrum) ([place])   . Arterial hypotension   . Hypocalcemia   . Scoliosis (and kyphoscoliosis), idiopathic 01/26/2014  . ROTATOR CUFF SYNDROME, LEFT 03/07/2010  . OTH MALIG NEOPLASM SKIN OTH&UNSPEC PARTS FACE 12/06/2009  . ACTINIC KERATOSIS, HEAD 09/06/2009  . Osteoarthritis 02/20/2009  . SYNCOPE, HX OF  02/20/2009  . MITRAL VALVE REPLACEMENT, HX OF 02/20/2009  . HIP REPLACEMENT, TOTAL, HX OF 02/20/2009  . APPENDECTOMY, HX OF 02/20/2009  . HERNIORRHAPHY, HX OF 02/20/2009  . EPISTAXIS, RECURRENT 11/19/2008  . UNS ADVRS EFF UNS RX MEDICINAL&BIOLOGICAL SBSTNC 06/12/2008  . OTHER AND UNSPECIFIED MITRAL VALVE DISEASES 03/12/2008  . ATRIAL FLUTTER 03/12/2008  . Essential hypertension 03/06/2007  . PEPTIC ULCER DISEASE 03/06/2007  . BENIGN PROSTATIC HYPERTROPHY 03/06/2007  . Coronary atherosclerosis 02/13/2007  . ROSACEA 02/13/2007  . TRANSIENT ISCHEMIC ATTACK, HX OF 02/13/2007  . DVT, HX OF 02/13/2007    Quay Burow, OTR/L 08/29/2016, 1:06 PM  [facility] [address] [place], [place], [address] Phone: [phone]   Fax:  [phone]  Name: Brian [name] MRN: [id number] Date of Birth: 04/17/1933

## 2016-08-29 NOTE — Patient Instructions (Signed)
We worked on tub bench transfers and lower body dressing today.  There are a few tips that allows Brian [name] to be as independent as possible.    Tub bench transfers:  1.  Cue him to take big steps and turn all the way around and back up until his the backs of his knees are touching the edge of the bench.   2. Reach back to sit. 3. SCOOT all the way back BEFORE swinging legs over 4  He can get his own legs over if you give him time.  He and I talked about it is better for him to lift his legs then you lifting as it is a functional way to strengthen his legs, his core and work on his sitting balance. 5. Before he gets OUT, scoot over the edge of the bench first and then swing legs over. 6. Make sure the bench is as high as it can go and still allow him to keep his feet on the floor. l   Lower body dressing: 1. He can get his pants over his feet.  Have him dress his left leg first.  It may be easier for him to cross his left leg over his right to put his foot in. 2. He can get his right foot in just needs cues to lift his foot to get his pants over his heel and to pull them up. 3. He can get socks and shoes on and off 4. The only help he needs is someone with him for balance and to tie his shoes.

## 2016-08-29 NOTE — Therapy (Signed)
[facility] [address] [place], [place], [address] Phone: [phone]   Fax:  [phone]  Physical Therapy Treatment  Patient Details  Name: Brian [name] MRN: [id number] Date of Birth: 10/17/1932 Referring Provider: Antony Contras, MD  Encounter Date: 08/29/2016      PT End of Session - 08/29/16 1450    Visit Number 4   Number of Visits 17   Date for PT Re-Evaluation 10/13/16   Authorization Type Medicare G-Code & progress note   PT Start Time 1230   PT Stop Time 1323   PT Time Calculation (min) 53 min   Equipment Utilized During Treatment Gait belt   Activity Tolerance Patient limited by fatigue;Patient tolerated treatment well   Behavior During Therapy [facility] for tasks assessed/performed      Past Medical History:  Diagnosis Date  . Anemia   . Atrial flutter ([place])   . BPH (benign prostatic hypertrophy)   . Epistaxis   . Hemorrhage of gastrointestinal tract, unspecified   . Hypertension   . Osteoarthrosis, unspecified whether generalized or localized, unspecified site   . Personal history of venous thrombosis and embolism   . Rosacea   . Stroke ([place])   . TIA (transient ischemic attack)     Past Surgical History:  Procedure Laterality Date  . APPENDECTOMY    . HERNIA REPAIR    . IR GENERIC HISTORICAL  05/04/2016   IR PERCUTANEOUS ART THROMBECTOMY/INFUSION INTRACRANIAL INC DIAG ANGIO 05/04/2016 [name], MD [facility]  . IR GENERIC HISTORICAL  05/04/2016   IR ANGIO VERTEBRAL SEL SUBCLAVIAN INNOMINATE UNI R MOD SED 05/04/2016 [name], MD [facility]  . IR GENERIC HISTORICAL  06/07/2016   IR RADIOLOGIST EVAL & MGMT 06/07/2016 [facility]  . JOINT REPLACEMENT    . MITRAL VALVE REPAIR    . mvp repair    . RADIOLOGY WITH ANESTHESIA N/A 05/04/2016   Procedure: RADIOLOGY WITH ANESTHESIA;  Surgeon: [name], MD;  Location: [facility];  Service: Radiology;  Laterality: N/A;  . TOTAL  HIP ARTHROPLASTY      There were no vitals filed for this visit.      Subjective Assessment - 08/29/16 1232    Subjective No more falls since entering building last session. He denies any new symptoms or issues since fall noted. He feels colored targets on RW help.    Limitations Lifting;Standing;Walking;House hold activities   Patient Stated Goals To get balance & walking back to pre stroke level   Currently in Pain? Yes   Pain Score 5    Pain Location Knee   Pain Orientation Right;Left   Pain Descriptors / Indicators Aching;Sore   Pain Type Chronic pain   Pain Onset More than a month ago   Pain Frequency Constant   Aggravating Factors  arthritis, walking   Pain Relieving Factors laying down, tylenol      Therapeutic Exercise:  NuStep Level 3 with BUEs & BLEs with cues for full ROM for 10 minutes.   Neuromuscular Re-education for posture & balance: Standing with RW support: alternate LEs 5 reps each- hip flexion & hip abduction with minA for support.  Seated on 24" stool with feet on floor no UE support: PT tactile & verbal cues for upright posture & righting reaction: 5 reps each- forward flexion to upright; trunk rotation right & left to look over shoulders & side bend right & left.   Gait Training:  Pt amb 52' with minA with tactile &  verbal cues on step length & upright posture, within RW andmanual cues for weight shift   Pt ambulated 105' & 75' with RW with minA.                             PT Short Term Goals - 08/24/16 1556      PT SHORT TERM GOAL #1   Title Patient demonstrates understanding of initial HEP with CNA cueing correctly.  (Target Date: 09/15/2016)   Time 4   Period Weeks   Status On-going     PT SHORT TERM GOAL #2   Title Patient reaches 7" anteriorly and within 10" of floor with RW support with supervision. (Target Date: 09/15/2016)   Time 4   Period Weeks   Status On-going     PT SHORT TERM GOAL #3   Title Patient  ambulates 200' with RW with supervision. (Target Date: 09/15/2016)   Time 4   Period Weeks   Status On-going     PT SHORT TERM GOAL #4   Title Patient negotiates ramps & curbs with RW with minimal guard. (Target Date: 09/15/2016)   Time 4   Period Weeks   Status On-going           PT Long Term Goals - 08/24/16 1556      PT LONG TERM GOAL #1   Title Patient & CNA demonstrate understanding of HEP. (Target Date: 10/13/2016)   Time 8   Period Weeks   Status On-going     PT LONG TERM GOAL #2   Title Patient able to balance in standing with RW support reaching 10" and manages clothes safely.  (Target Date: 10/13/2016)    Time 8   Period Weeks   Status On-going     PT LONG TERM GOAL #3   Title Patient ambulates 300' with RW with supervision for community mobility  (Target Date: 10/13/2016)   Time 8   Period Weeks   Status On-going     PT LONG TERM GOAL #4   Title Patient negotiates ramps & curbs with RW and stairs with 2 rails with supervision for community access.  (Target Date: 10/13/2016)   Time 8   Period Weeks   Status On-going     PT LONG TERM GOAL #5   Title Patient ambulates with RW around furniture 100' modified independent for household mobility.  (Target Date: 10/13/2016)   Time 8   Period Weeks   Status On-going               Plan - 08/29/16 1451    Clinical Impression Statement Patient was able to improve trunk balance reactions seated on 24" stool without UE support. Due to arthritic issues, he is unable to stand without UE support. Pt improved gait with manual, visual & verbal cues.    Rehab Potential Good   PT Frequency 2x / week   PT Duration 8 weeks   PT Treatment/Interventions ADLs/Self Care Home Management;DME Instruction;Gait training;Stair training;Functional mobility training;Therapeutic activities;Therapeutic exercise;Balance training;Neuromuscular re-education;Patient/family education   PT Next Visit Plan continue to work toward goals, work on  ramps & curbs, trial rollator vs RW as pt reports he used rollator at home prior to stroke   Consulted and Agree with Plan of Care Patient;Family member/caregiver   Family Member Consulted CNA      Patient will benefit from skilled therapeutic intervention in order to improve the following deficits and impairments:  Abnormal gait, Decreased activity tolerance, Decreased balance, Decreased coordination, Decreased knowledge of use of DME, Decreased mobility, Decreased range of motion, Decreased strength, Postural dysfunction, Pain  Visit Diagnosis: Hemiplegia and hemiparesis following cerebral infarction affecting left non-dominant side (HCC)  Unsteadiness on feet  Muscle weakness (generalized)  Other abnormalities of gait and mobility     Problem List Patient Active Problem List   Diagnosis Date Noted  . Cerebrovascular accident (CVA) due to thrombosis of right middle cerebral artery ([place])   . Benign essential HTN   . TIA (transient ischemic attack)   . Coronary artery disease involving native coronary artery of native heart without angina pectoris   . H/O mitral valve repair   . Tachypnea   . Paroxysmal atrial fibrillation (HCC)   . Hypokalemia   . Acute blood loss anemia   . Thrombocytopenia ([place])   . Dysphagia   . Dysarthria, post-stroke   . Dysphagia, post-stroke   . Acute respiratory failure ([place])   . Acute CVA (cerebrovascular accident) ([place])   . Cerebrovascular accident (CVA) due to thrombosis of precerebral artery ([place])   . Stroke (cerebrum) ([place])   . Arterial hypotension   . Hypocalcemia   . Scoliosis (and kyphoscoliosis), idiopathic 01/26/2014  . ROTATOR CUFF SYNDROME, LEFT 03/07/2010  . OTH MALIG NEOPLASM SKIN OTH&UNSPEC PARTS FACE 12/06/2009  . ACTINIC KERATOSIS, HEAD 09/06/2009  . Osteoarthritis 02/20/2009  . SYNCOPE, HX OF 02/20/2009  . MITRAL VALVE REPLACEMENT, HX OF 02/20/2009  . HIP REPLACEMENT, TOTAL, HX OF 02/20/2009  . APPENDECTOMY, HX OF  02/20/2009  . HERNIORRHAPHY, HX OF 02/20/2009  . EPISTAXIS, RECURRENT 11/19/2008  . UNS ADVRS EFF UNS RX MEDICINAL&BIOLOGICAL SBSTNC 06/12/2008  . OTHER AND UNSPECIFIED MITRAL VALVE DISEASES 03/12/2008  . ATRIAL FLUTTER 03/12/2008  . Essential hypertension 03/06/2007  . PEPTIC ULCER DISEASE 03/06/2007  . BENIGN PROSTATIC HYPERTROPHY 03/06/2007  . Coronary atherosclerosis 02/13/2007  . ROSACEA 02/13/2007  . TRANSIENT ISCHEMIC ATTACK, HX OF 02/13/2007  . DVT, HX OF 02/13/2007    Jamey Reas PT, DPT 08/29/2016, 2:56 PM  [facility] [address] [place], [place], [address] Phone: [phone]   Fax:  [phone]  Name: CLINTEN HOUSE MRN: [id number] Date of Birth: July 14, 1933

## 2016-08-29 NOTE — Telephone Encounter (Signed)
Agree with wheelchair

## 2016-08-30 ENCOUNTER — Other Ambulatory Visit: Payer: Self-pay

## 2016-08-30 DIAGNOSIS — I6309 Cerebral infarction due to thrombosis of other precerebral artery: Secondary | ICD-10-CM

## 2016-08-30 NOTE — Telephone Encounter (Signed)
Order put in for k2 lightweight wheelchair in epic.

## 2016-08-31 ENCOUNTER — Ambulatory Visit: Payer: Medicare Other | Admitting: Physical Therapy

## 2016-08-31 ENCOUNTER — Ambulatory Visit: Payer: Medicare Other | Admitting: Occupational Therapy

## 2016-08-31 ENCOUNTER — Encounter: Payer: Self-pay | Admitting: Occupational Therapy

## 2016-08-31 VITALS — BP 118/73

## 2016-08-31 DIAGNOSIS — M25512 Pain in left shoulder: Secondary | ICD-10-CM

## 2016-08-31 DIAGNOSIS — M6281 Muscle weakness (generalized): Secondary | ICD-10-CM | POA: Diagnosis not present

## 2016-08-31 DIAGNOSIS — R278 Other lack of coordination: Secondary | ICD-10-CM | POA: Diagnosis not present

## 2016-08-31 DIAGNOSIS — R261 Paralytic gait: Secondary | ICD-10-CM | POA: Diagnosis not present

## 2016-08-31 DIAGNOSIS — G8929 Other chronic pain: Secondary | ICD-10-CM

## 2016-08-31 DIAGNOSIS — M25612 Stiffness of left shoulder, not elsewhere classified: Secondary | ICD-10-CM

## 2016-08-31 DIAGNOSIS — R2681 Unsteadiness on feet: Secondary | ICD-10-CM

## 2016-08-31 DIAGNOSIS — R2689 Other abnormalities of gait and mobility: Secondary | ICD-10-CM | POA: Diagnosis not present

## 2016-08-31 DIAGNOSIS — I69354 Hemiplegia and hemiparesis following cerebral infarction affecting left non-dominant side: Secondary | ICD-10-CM

## 2016-08-31 NOTE — Therapy (Signed)
[facility] [address] [place], [place], [address] Phone: [phone]   Fax:  [phone]  Occupational Therapy Treatment  Patient Details  Name: Brian [name] MRN: [id number] Date of Birth: [birth year] Referring Provider: Dr. Leonie Man  Encounter Date: 08/31/2016      OT End of Session - 08/31/16 1511    Visit Number 5   Number of Visits 16   Date for OT Re-Evaluation 10/10/16   Authorization Type Medicare - pt will need G code and PN every 10th visit   Authorization Time Period 60 days   Authorization - Visit Number 5   Authorization - Number of Visits 10   OT Start Time 1401   OT Stop Time 1440   OT Time Calculation (min) 39 min   Activity Tolerance Patient tolerated treatment well      Past Medical History:  Diagnosis Date  . Anemia   . Atrial flutter ([place])   . BPH (benign prostatic hypertrophy)   . Epistaxis   . Hemorrhage of gastrointestinal tract, unspecified   . Hypertension   . Osteoarthrosis, unspecified whether generalized or localized, unspecified site   . Personal history of venous thrombosis and embolism   . Rosacea   . Stroke ([place])   . TIA (transient ischemic attack)     Past Surgical History:  Procedure Laterality Date  . APPENDECTOMY    . HERNIA REPAIR    . IR GENERIC HISTORICAL  05/04/2016   IR PERCUTANEOUS ART THROMBECTOMY/INFUSION INTRACRANIAL INC DIAG ANGIO 05/04/2016 [name], MD [facility]  . IR GENERIC HISTORICAL  05/04/2016   IR ANGIO VERTEBRAL SEL SUBCLAVIAN INNOMINATE UNI R MOD SED 05/04/2016 [name], MD [facility]  . IR GENERIC HISTORICAL  06/07/2016   IR RADIOLOGIST EVAL & MGMT 06/07/2016 [facility]  . JOINT REPLACEMENT    . MITRAL VALVE REPAIR    . mvp repair    . RADIOLOGY WITH ANESTHESIA N/A 05/04/2016   Procedure: RADIOLOGY WITH ANESTHESIA;  Surgeon: [name], MD;  Location: [facility];  Service: Radiology;  Laterality: N/A;  . TOTAL HIP  ARTHROPLASTY      There were no vitals filed for this visit.      Subjective Assessment - 08/31/16 1403    Subjective  I was able to get in and out of the shower with [name] just standing there.  I also did most of the dressing   Pertinent History see epic    Patient Stated Goals I want to go back to work.                       OT Treatments/Exercises (OP) - 08/31/16 0001      ADLs   UB Dressing Practicing buttoning shirt and zipping coat. Pt needs vc's to hold items appropriately in L hand and cues to use vision to guide use but then is able to complete both.     Functional Mobility Focus on functional ambulation with RW with emphasis on increasing use of LLE, erect trunk and activity tolerance as well as safety.   ADL Comments Reassessed goals see goal section for updates. Pt has improved in grip strength, LUE functional use and self care.       Exercises   Exercises Hand     Hand Exercises   Other Hand Exercises Addressed functional activities with L hand with emphasis on hand orientation, using vision to compensate, strength and coordination  OT Short Term Goals - 08/31/16 1509      OT SHORT TERM GOAL #1   Title Pt and caregiver will be mod I with HEP - 09/12/2016   Status Achieved     OT SHORT TERM GOAL #2   Title Pt will demonstrate improved grip strength by 2 pounds to assist with functional tasks. (baseline = 25 pounds)   Status Achieved  32     OT SHORT TERM GOAL #3   Title Pt will require min a for tub bench transfers   Status Achieved     OT SHORT TERM GOAL #4   Title Pt will be mod I to button large buttons    Status Achieved     OT SHORT TERM GOAL #5   Title Pt will be able to don pants over feet with vc's only, AE as necessary   Status Achieved           OT Long Term Goals - 08/31/16 1509      OT LONG TERM GOAL #1   Title Pt and caregiver will be mod I with upgraded HEP - 10/10/2016   Status On-going      OT LONG TERM GOAL #2   Title Pt will demonstrate improve grip strength by 5 pounds to assist with functional tasks (baseline = 25)   Status Achieved  32     OT LONG TERM GOAL #3   Title Pt will improve on box and blocks by at least 5 blocks to indicate improve LUE function for tasks. (baseline = 15)   Status On-going     OT LONG TERM GOAL #4   Title Pt will be min a for LB dressing, AE prn   Status Achieved  28     OT LONG TERM GOAL #5   Title Pt will be able to lift light weight object off overhead shelf x3 without dropping   Status On-going     OT LONG TERM GOAL #6   Title Pt will be mod I for zipping   Status Achieved     OT LONG TERM GOAL #7   Title Pt will be able to use LUE as non dominant UE during basic ADL tasks.    Status Achieved               Plan - 08/31/16 1510    Clinical Impression Statement Pt making excellent progress toward goals after strategies implemented and with cueing from caregiver.    Rehab Potential Good   Clinical Impairments Affecting Rehab Potential significant arthritis and pain in muliple joints (back, hips, L shoulder), decreased activity tolerance   OT Frequency 2x / week   OT Duration 8 weeks   OT Treatment/Interventions Self-care/ADL training;Moist Heat;DME and/or AE instruction;Neuromuscular education;Therapeutic exercise;Functional Mobility Training;Manual Therapy;Passive range of motion;Splinting;Therapeutic activities;Balance training;Patient/family education   Plan HEP for gentle shoulder exercises, assess remaining goals.    Consulted and Agree with Plan of Care Patient;Family member/caregiver   Family Member Consulted caregiver [name]      Patient will benefit from skilled therapeutic intervention in order to improve the following deficits and impairments:  Abnormal gait, Decreased activity tolerance, Decreased balance, Decreased coordination, Decreased knowledge of use of DME, Decreased mobility, Decreased range of  motion, Difficulty walking, Decreased strength, Impaired UE functional use, Pain  Visit Diagnosis: Hemiplegia and hemiparesis following cerebral infarction affecting left non-dominant side (HCC)  Unsteadiness on feet  Muscle weakness (generalized)  Other lack of coordination  Chronic left shoulder  pain  Stiffness of left shoulder, not elsewhere classified    Problem List Patient Active Problem List   Diagnosis Date Noted  . Cerebrovascular accident (CVA) due to thrombosis of right middle cerebral artery ([place])   . Benign essential HTN   . TIA (transient ischemic attack)   . Coronary artery disease involving native coronary artery of native heart without angina pectoris   . H/O mitral valve repair   . Tachypnea   . Paroxysmal atrial fibrillation (HCC)   . Hypokalemia   . Acute blood loss anemia   . Thrombocytopenia ([place])   . Dysphagia   . Dysarthria, post-stroke   . Dysphagia, post-stroke   . Acute respiratory failure ([place])   . Acute CVA (cerebrovascular accident) ([place])   . Cerebrovascular accident (CVA) due to thrombosis of precerebral artery ([place])   . Stroke (cerebrum) ([place])   . Arterial hypotension   . Hypocalcemia   . Scoliosis (and kyphoscoliosis), idiopathic 01/26/2014  . ROTATOR CUFF SYNDROME, LEFT 03/07/2010  . OTH MALIG NEOPLASM SKIN OTH&UNSPEC PARTS FACE 12/06/2009  . ACTINIC KERATOSIS, HEAD 09/06/2009  . Osteoarthritis 02/20/2009  . SYNCOPE, HX OF 02/20/2009  . MITRAL VALVE REPLACEMENT, HX OF 02/20/2009  . HIP REPLACEMENT, TOTAL, HX OF 02/20/2009  . APPENDECTOMY, HX OF 02/20/2009  . HERNIORRHAPHY, HX OF 02/20/2009  . EPISTAXIS, RECURRENT 11/19/2008  . UNS ADVRS EFF UNS RX MEDICINAL&BIOLOGICAL SBSTNC 06/12/2008  . OTHER AND UNSPECIFIED MITRAL VALVE DISEASES 03/12/2008  . ATRIAL FLUTTER 03/12/2008  . Essential hypertension 03/06/2007  . PEPTIC ULCER DISEASE 03/06/2007  . BENIGN PROSTATIC HYPERTROPHY 03/06/2007  . Coronary atherosclerosis 02/13/2007  .  ROSACEA 02/13/2007  . TRANSIENT ISCHEMIC ATTACK, HX OF 02/13/2007  . DVT, HX OF 02/13/2007    Quay Burow, OTR/L 08/31/2016, 3:14 PM  [facility] [address] [place], [place], [address] Phone: [phone]   Fax:  [phone]  Name: Brian [name] MRN: [id number] Date of Birth: [birth year]

## 2016-08-31 NOTE — Therapy (Signed)
[facility] [address] [place], [place], [address] Phone: [phone]   Fax:  [phone]  Physical Therapy Treatment  Patient Details  Name: Brian [name] MRN: [id number] Date of Birth: Jun 02, 1933 Referring Provider: Antony Contras, MD  Encounter Date: 08/31/2016      PT End of Session - 08/31/16 1444    Visit Number 5   Number of Visits 17   Date for PT Re-Evaluation 10/13/16   Authorization Type Medicare G-Code & progress note   PT Start Time 1315   PT Stop Time 1358   PT Time Calculation (min) 43 min   Equipment Utilized During Treatment Gait belt   Activity Tolerance Patient tolerated treatment well;Patient limited by fatigue   Behavior During Therapy [facility] for tasks assessed/performed      Past Medical History:  Diagnosis Date  . Anemia   . Atrial flutter ([place])   . BPH (benign prostatic hypertrophy)   . Epistaxis   . Hemorrhage of gastrointestinal tract, unspecified   . Hypertension   . Osteoarthrosis, unspecified whether generalized or localized, unspecified site   . Personal history of venous thrombosis and embolism   . Rosacea   . Stroke ([place])   . TIA (transient ischemic attack)     Past Surgical History:  Procedure Laterality Date  . APPENDECTOMY    . HERNIA REPAIR    . IR GENERIC HISTORICAL  05/04/2016   IR PERCUTANEOUS ART THROMBECTOMY/INFUSION INTRACRANIAL INC DIAG ANGIO 05/04/2016 [name], MD [facility]  . IR GENERIC HISTORICAL  05/04/2016   IR ANGIO VERTEBRAL SEL SUBCLAVIAN INNOMINATE UNI R MOD SED 05/04/2016 [name], MD [facility]  . IR GENERIC HISTORICAL  06/07/2016   IR RADIOLOGIST EVAL & MGMT 06/07/2016 [facility]  . JOINT REPLACEMENT    . MITRAL VALVE REPAIR    . mvp repair    . RADIOLOGY WITH ANESTHESIA N/A 05/04/2016   Procedure: RADIOLOGY WITH ANESTHESIA;  Surgeon: [name], MD;  Location: [facility];  Service: Radiology;  Laterality: N/A;  . TOTAL  HIP ARTHROPLASTY      Vitals:   08/31/16 1321  BP: 118/73        Subjective Assessment - 08/31/16 1321    Subjective had a bad headache yesterday, but otherwise doing well.  no new falls.   Limitations Lifting;Standing;Walking;House hold activities   Patient Stated Goals To get balance & walking back to pre stroke level   Currently in Pain? Yes   Pain Score 5    Pain Location Knee  and back   Pain Orientation Right;Left   Pain Descriptors / Indicators Aching;Sore   Pain Type Chronic pain   Pain Onset More than a month ago   Pain Frequency Constant   Aggravating Factors  arthritis, walking   Pain Relieving Factors lying down, tylenol                         [facility] Adult PT Treatment/Exercise - 08/31/16 1334      Ambulation/Gait   Ambulation/Gait Yes   Ambulation/Gait Assistance 4: Min assist   Ambulation/Gait Assistance Details trialed rollator today needing min A to control rollator and mod cues for step length   Ambulation Distance (Feet) 125 Feet  with 3 seated rest breaks   Assistive device Rolling walker;4-wheeled walker   Gait Pattern Step-to pattern;Decreased step length - right;Decreased stance time - left;Decreased stride length;Decreased hip/knee flexion - left;Decreased weight shift to left;Left circumduction;Left flexed  knee in stance;Antalgic;Trunk flexed;Poor foot clearance - left   Gait Comments increased time and seated rest breaks needed every 30-50' of amb     Exercises   Exercises Knee/Hip     Knee/Hip Exercises: Aerobic   Nustep L4x10 min                  PT Short Term Goals - 08/24/16 1556      PT SHORT TERM GOAL #1   Title Patient demonstrates understanding of initial HEP with CNA cueing correctly.  (Target Date: 09/15/2016)   Time 4   Period Weeks   Status On-going     PT SHORT TERM GOAL #2   Title Patient reaches 7" anteriorly and within 10" of floor with RW support with supervision. (Target Date: 09/15/2016)    Time 4   Period Weeks   Status On-going     PT SHORT TERM GOAL #3   Title Patient ambulates 200' with RW with supervision. (Target Date: 09/15/2016)   Time 4   Period Weeks   Status On-going     PT SHORT TERM GOAL #4   Title Patient negotiates ramps & curbs with RW with minimal guard. (Target Date: 09/15/2016)   Time 4   Period Weeks   Status On-going           PT Long Term Goals - 08/24/16 1556      PT LONG TERM GOAL #1   Title Patient & CNA demonstrate understanding of HEP. (Target Date: 10/13/2016)   Time 8   Period Weeks   Status On-going     PT LONG TERM GOAL #2   Title Patient able to balance in standing with RW support reaching 10" and manages clothes safely.  (Target Date: 10/13/2016)    Time 8   Period Weeks   Status On-going     PT LONG TERM GOAL #3   Title Patient ambulates 300' with RW with supervision for community mobility  (Target Date: 10/13/2016)   Time 8   Period Weeks   Status On-going     PT LONG TERM GOAL #4   Title Patient negotiates ramps & curbs with RW and stairs with 2 rails with supervision for community access.  (Target Date: 10/13/2016)   Time 8   Period Weeks   Status On-going     PT LONG TERM GOAL #5   Title Patient ambulates with RW around furniture 100' modified independent for household mobility.  (Target Date: 10/13/2016)   Time 8   Period Weeks   Status On-going               Plan - 08/31/16 1444    Clinical Impression Statement Pt continues to fatigue easily with ambulation.  Trialed rollator today with min A for control of rollator and cues for sequencing and technique.  Recommend continued practice with rollator as pt unsafe at this time.  Will continue to benefit from PT to maximize function.   PT Treatment/Interventions ADLs/Self Care Home Management;DME Instruction;Gait training;Stair training;Functional mobility training;Therapeutic activities;Therapeutic exercise;Balance training;Neuromuscular  re-education;Patient/family education   PT Next Visit Plan continue to work toward goals, work on ramps & curbs, trial rollator vs RW as pt reports he used rollator at home prior to stroke   Consulted and Agree with Plan of Care Patient;Family member/caregiver   Family Member Consulted CNA      Patient will benefit from skilled therapeutic intervention in order to improve the following deficits and impairments:  Abnormal gait,  Decreased activity tolerance, Decreased balance, Decreased coordination, Decreased knowledge of use of DME, Decreased mobility, Decreased range of motion, Decreased strength, Postural dysfunction, Pain  Visit Diagnosis: Hemiplegia and hemiparesis following cerebral infarction affecting left non-dominant side (HCC)  Unsteadiness on feet  Muscle weakness (generalized)  Other abnormalities of gait and mobility     Problem List Patient Active Problem List   Diagnosis Date Noted  . Cerebrovascular accident (CVA) due to thrombosis of right middle cerebral artery ([place])   . Benign essential HTN   . TIA (transient ischemic attack)   . Coronary artery disease involving native coronary artery of native heart without angina pectoris   . H/O mitral valve repair   . Tachypnea   . Paroxysmal atrial fibrillation (HCC)   . Hypokalemia   . Acute blood loss anemia   . Thrombocytopenia ([place])   . Dysphagia   . Dysarthria, post-stroke   . Dysphagia, post-stroke   . Acute respiratory failure ([place])   . Acute CVA (cerebrovascular accident) ([place])   . Cerebrovascular accident (CVA) due to thrombosis of precerebral artery ([place])   . Stroke (cerebrum) ([place])   . Arterial hypotension   . Hypocalcemia   . Scoliosis (and kyphoscoliosis), idiopathic 01/26/2014  . ROTATOR CUFF SYNDROME, LEFT 03/07/2010  . OTH MALIG NEOPLASM SKIN OTH&UNSPEC PARTS FACE 12/06/2009  . ACTINIC KERATOSIS, HEAD 09/06/2009  . Osteoarthritis 02/20/2009  . SYNCOPE, HX OF 02/20/2009  . MITRAL VALVE  REPLACEMENT, HX OF 02/20/2009  . HIP REPLACEMENT, TOTAL, HX OF 02/20/2009  . APPENDECTOMY, HX OF 02/20/2009  . HERNIORRHAPHY, HX OF 02/20/2009  . EPISTAXIS, RECURRENT 11/19/2008  . UNS ADVRS EFF UNS RX MEDICINAL&BIOLOGICAL SBSTNC 06/12/2008  . OTHER AND UNSPECIFIED MITRAL VALVE DISEASES 03/12/2008  . ATRIAL FLUTTER 03/12/2008  . Essential hypertension 03/06/2007  . PEPTIC ULCER DISEASE 03/06/2007  . BENIGN PROSTATIC HYPERTROPHY 03/06/2007  . Coronary atherosclerosis 02/13/2007  . ROSACEA 02/13/2007  . TRANSIENT ISCHEMIC ATTACK, HX OF 02/13/2007  . DVT, HX OF 02/13/2007      Laureen Abrahams, PT, DPT 08/31/16 2:46 PM    [facility] [address] [place], [place], [address] Phone: [phone]   Fax:  [phone]  Name: Brian [name] MRN: [id number] Date of Birth: December 18, 1932

## 2016-09-04 ENCOUNTER — Telehealth: Payer: Self-pay

## 2016-09-04 ENCOUNTER — Other Ambulatory Visit: Payer: Self-pay | Admitting: Internal Medicine

## 2016-09-04 NOTE — Telephone Encounter (Signed)
Angie with [facility] returning nurse's call. Please call [phone] EXT [phone]

## 2016-09-04 NOTE — Telephone Encounter (Signed)
Lightweight wheelchair necessity forms fax to [place] to [employer] at [phone]. Fax receive and confirm [phone].

## 2016-09-04 NOTE — Telephone Encounter (Signed)
RN call [facility] back [phone] ext [phone]. Rn explain that pt was seen in 07/2016 by Dr.Sethi. Rn explain to Angie that [facility] office note will not have any documentation of pt needing wheelchair. Angie stated the evaluation form that was fax can be replace with the progress note. Rn explain that the patients therapist requested a light weight wheelchair. Angie stated insurances will only cover a new wheelchair every 5 years.If a patient needs repairs done to the current wheelchair they will need to contact [facility] and request a repair.  The forms will be sign and done by Dr.Sethi. Angie verbalized understanding.

## 2016-09-04 NOTE — Telephone Encounter (Signed)
Rn left vm with [facility] about needing office notes and medical necessity forms filled out for light weight wheelchair. Rn left message that the patients therapist recommend the wheelchair. The pt was last seen 07/2016 so the office notes will not reflect wheelchair usage. Dr. Leonie Man did order for light wheelchair. [facility] number.

## 2016-09-05 ENCOUNTER — Other Ambulatory Visit: Payer: Self-pay | Admitting: Internal Medicine

## 2016-09-05 ENCOUNTER — Ambulatory Visit: Payer: Medicare Other | Admitting: Physical Therapy

## 2016-09-05 ENCOUNTER — Ambulatory Visit: Payer: Medicare Other | Admitting: Occupational Therapy

## 2016-09-05 ENCOUNTER — Encounter: Payer: Self-pay | Admitting: Physical Therapy

## 2016-09-05 ENCOUNTER — Encounter: Payer: Self-pay | Admitting: Occupational Therapy

## 2016-09-05 ENCOUNTER — Telehealth: Payer: Self-pay | Admitting: Internal Medicine

## 2016-09-05 VITALS — BP 110/65 | HR 76

## 2016-09-05 DIAGNOSIS — R2681 Unsteadiness on feet: Secondary | ICD-10-CM

## 2016-09-05 DIAGNOSIS — R261 Paralytic gait: Secondary | ICD-10-CM | POA: Diagnosis not present

## 2016-09-05 DIAGNOSIS — R278 Other lack of coordination: Secondary | ICD-10-CM | POA: Diagnosis not present

## 2016-09-05 DIAGNOSIS — M6281 Muscle weakness (generalized): Secondary | ICD-10-CM

## 2016-09-05 DIAGNOSIS — G8929 Other chronic pain: Secondary | ICD-10-CM

## 2016-09-05 DIAGNOSIS — M25612 Stiffness of left shoulder, not elsewhere classified: Secondary | ICD-10-CM

## 2016-09-05 DIAGNOSIS — R2689 Other abnormalities of gait and mobility: Secondary | ICD-10-CM | POA: Diagnosis not present

## 2016-09-05 DIAGNOSIS — M25512 Pain in left shoulder: Secondary | ICD-10-CM

## 2016-09-05 DIAGNOSIS — I69354 Hemiplegia and hemiparesis following cerebral infarction affecting left non-dominant side: Secondary | ICD-10-CM

## 2016-09-05 NOTE — Patient Instructions (Signed)
Home Exercise program for your left arm: Do these every day 1- 2 times per day. Always work within your pain limits. It is NEVER good to do things that cause pain in your shoulder.  These exercises shouldn't hurt.   1. Sit at table and hold a rolled towel between your hands, palms facing down.  Slowly slide the towel out as far as you feel you can WITHOUT PAIN.  Return to starting position.  Do 10, rest then do 5 more. You can use a target if that makes it easier. (Reach for 12:OO)  2.  Now repeat this but slide the towel slowly to the left. Your elbows should be straight when you slide the towel all the way out.  Do 10, rest then do 5 more.Marland Kitchen STOP if you get pain in your shoulder. ONLY REACH AS LONG FAR AS YOU CAN WITHOUT PAIN.  (Reach for 10:00).    3. Now repeat this but slide the towel slowly to the right. Your elbows should be straight when you slide the towel all the way out. Do 10, rest then do 5 more.  (Reach for 2:00).   Adjust the target so that you work within your limits of pain.  You do not want to flare up the arthritis in your shoulder or your back.  These should be gentle stretches they should NOT cause discomfort.  If you get some muscle soreness due to doing a new exercise you can reduce the number of repetitions and build up the ones listed here.

## 2016-09-05 NOTE — Telephone Encounter (Signed)
° ° ° °  Daughter said her father wants to travel in May to [place] and she does not want him to because of his health. She is asking if Dr Raliegh Ip will discuss him traveling when he comes in for his appt in February

## 2016-09-05 NOTE — Therapy (Signed)
[facility] [address] [place], [place], [address] Phone: [phone]   Fax:  [phone]  Occupational Therapy Treatment  Patient Details  Name: Brian [name] MRN: [id number] Date of Birth: [birth year] Referring Provider: Dr. Leonie Man  Encounter Date: 09/05/2016      OT End of Session - 09/05/16 1627    Visit Number 6   Number of Visits 16   Date for OT Re-Evaluation 10/10/16   Authorization Type Medicare - pt will need G code and PN every 10th visit   Authorization Time Period 60 days   Authorization - Visit Number 6   Authorization - Number of Visits 10   OT Start Time 4081   OT Stop Time 1356   OT Time Calculation (min) 41 min   Activity Tolerance Patient tolerated treatment well      Past Medical History:  Diagnosis Date  . Anemia   . Atrial flutter ([place])   . BPH (benign prostatic hypertrophy)   . Epistaxis   . Hemorrhage of gastrointestinal tract, unspecified   . Hypertension   . Osteoarthrosis, unspecified whether generalized or localized, unspecified site   . Personal history of venous thrombosis and embolism   . Rosacea   . Stroke ([place])   . TIA (transient ischemic attack)     Past Surgical History:  Procedure Laterality Date  . APPENDECTOMY    . HERNIA REPAIR    . IR GENERIC HISTORICAL  05/04/2016   IR PERCUTANEOUS ART THROMBECTOMY/INFUSION INTRACRANIAL INC DIAG ANGIO 05/04/2016 [name], MD [facility]  . IR GENERIC HISTORICAL  05/04/2016   IR ANGIO VERTEBRAL SEL SUBCLAVIAN INNOMINATE UNI R MOD SED 05/04/2016 [name], MD [facility]  . IR GENERIC HISTORICAL  06/07/2016   IR RADIOLOGIST EVAL & MGMT 06/07/2016 [facility]  . JOINT REPLACEMENT    . MITRAL VALVE REPAIR    . mvp repair    . RADIOLOGY WITH ANESTHESIA N/A 05/04/2016   Procedure: RADIOLOGY WITH ANESTHESIA;  Surgeon: [name], MD;  Location: [facility];  Service: Radiology;  Laterality: N/A;  . TOTAL HIP  ARTHROPLASTY      Vitals:   09/05/16 1329  BP: 110/65  Pulse: 76        Subjective Assessment - 09/05/16 1329    Subjective  Will you take my BP? I just want to know.  (BP WNL's.  See vital section)   Patient is accompained by: --  caergiver Aldona Bar   Pertinent History see epic    Patient Stated Goals I want to go back to work.    Currently in Pain? Yes   Pain Location Toe (Comment which one)  Second toe on L foot   Pain Orientation Left   Pain Descriptors / Indicators Sore   Pain Type Acute pain   Pain Onset In the past 7 days  2 days ago   Pain Frequency Constant   Aggravating Factors  started 2 days ago, walking makes it worse   Pain Relieving Factors rest, sitting down                      OT Treatments/Exercises (OP) - 09/05/16 0001      ADLs   ADL Comments Checked remaining goals - see goal section for updates.      Exercises   Exercises Shoulder     Shoulder Exercises: Seated   Other Seated Exercises Issued gentle ROM and strengthening program for LUE - pt is  limited in ability for resistive strengthening program due to severe arthritis premorbidly.  Had pt complete entire program for new learning and to assess if program would cause any joint pain. Caregiver Aldona Bar present and will monitor as well. Pt and caregiver instructed on how to modify program if shoulder pain worsens.  Both verbalized understanding and caregiver to assist with HEP prn. Pt able to return demonstrate all activities.                  OT Education - 09/05/16 1626    Education provided Yes   Education Details UE HEP   Person(s) Educated Patient;Caregiver(s)   Methods Explanation;Demonstration;Verbal cues;Handout   Comprehension Verbalized understanding;Returned demonstration  caregiver to monitor and cue prn          OT Short Term Goals - 09/05/16 1626      OT SHORT TERM GOAL #1   Title Pt and caregiver will be mod I with HEP - 09/12/2016   Status Achieved      OT SHORT TERM GOAL #2   Title Pt will demonstrate improved grip strength by 2 pounds to assist with functional tasks. (baseline = 25 pounds)   Status Achieved  32     OT SHORT TERM GOAL #3   Title Pt will require min a for tub bench transfers   Status Achieved     OT SHORT TERM GOAL #4   Title Pt will be mod I to button large buttons    Status Achieved     OT SHORT TERM GOAL #5   Title Pt will be able to don pants over feet with vc's only, AE as necessary   Status Achieved           OT Long Term Goals - 09/05/16 1626      OT LONG TERM GOAL #1   Title Pt and caregiver will be mod I with upgraded HEP - 10/10/2016   Status Achieved     OT LONG TERM GOAL #2   Title Pt will demonstrate improve grip strength by 5 pounds to assist with functional tasks (baseline = 25)   Status Achieved  32     OT LONG TERM GOAL #3   Title Pt will improve on box and blocks by at least 5 blocks to indicate improve LUE function for tasks. (baseline = 15)   Status Achieved  28     OT LONG TERM GOAL #4   Title Pt will be min a for LB dressing, AE prn   Status Achieved  28     OT LONG TERM GOAL #5   Title Pt will be able to lift light weight object off overhead shelf x3 without dropping   Status Achieved     OT LONG TERM GOAL #6   Title Pt will be mod I for zipping   Status Achieved     OT LONG TERM GOAL #7   Title Pt will be able to use LUE as non dominant UE during basic ADL tasks.    Status Achieved               Plan - 09/05/16 1627    Clinical Impression Statement Pt has met all goals and is ready for discharge.    Rehab Potential Good   Clinical Impairments Affecting Rehab Potential significant arthritis and pain in muliple joints (back, hips, L shoulder), decreased activity tolerance   OT Frequency 2x / week   OT Duration 8 weeks  OT Treatment/Interventions Self-care/ADL training;Moist Heat;DME and/or AE instruction;Neuromuscular education;Therapeutic  exercise;Functional Mobility Training;Manual Therapy;Passive range of motion;Splinting;Therapeutic activities;Balance training;Patient/family education   Plan d/c from OT   Consulted and Agree with Plan of Care Patient;Family member/caregiver   Family Member Consulted caregiver [name]      Patient will benefit from skilled therapeutic intervention in order to improve the following deficits and impairments:  Abnormal gait, Decreased activity tolerance, Decreased balance, Decreased coordination, Decreased knowledge of use of DME, Decreased mobility, Decreased range of motion, Difficulty walking, Decreased strength, Impaired UE functional use, Pain  Visit Diagnosis: Hemiplegia and hemiparesis following cerebral infarction affecting left non-dominant side (HCC)  Unsteadiness on feet  Muscle weakness (generalized)  Other lack of coordination  Chronic left shoulder pain  Stiffness of left shoulder, not elsewhere classified      G-Codes - 2016-09-24 1628    Functional Assessment Tool Used box and blocks, skilled clinical observation   Functional Limitation Self care   Self Care Current Status (T7017) At least 1 percent but less than 20 percent impaired, limited or restricted   Self Care Goal Status (B9390) At least 1 percent but less than 20 percent impaired, limited or restricted   Self Care Discharge Status [phone]) At least 1 percent but less than 20 percent impaired, limited or restricted      Problem List Patient Active Problem List   Diagnosis Date Noted  . Cerebrovascular accident (CVA) due to thrombosis of right middle cerebral artery ([place])   . Benign essential HTN   . TIA (transient ischemic attack)   . Coronary artery disease involving native coronary artery of native heart without angina pectoris   . H/O mitral valve repair   . Tachypnea   . Paroxysmal atrial fibrillation (HCC)   . Hypokalemia   . Acute blood loss anemia   . Thrombocytopenia ([place])   . Dysphagia   .  Dysarthria, post-stroke   . Dysphagia, post-stroke   . Acute respiratory failure ([place])   . Acute CVA (cerebrovascular accident) ([place])   . Cerebrovascular accident (CVA) due to thrombosis of precerebral artery ([place])   . Stroke (cerebrum) ([place])   . Arterial hypotension   . Hypocalcemia   . Scoliosis (and kyphoscoliosis), idiopathic 01/26/2014  . ROTATOR CUFF SYNDROME, LEFT 03/07/2010  . OTH MALIG NEOPLASM SKIN OTH&UNSPEC PARTS FACE 12/06/2009  . ACTINIC KERATOSIS, HEAD 09/06/2009  . Osteoarthritis 02/20/2009  . SYNCOPE, HX OF 02/20/2009  . MITRAL VALVE REPLACEMENT, HX OF 02/20/2009  . HIP REPLACEMENT, TOTAL, HX OF 02/20/2009  . APPENDECTOMY, HX OF 02/20/2009  . HERNIORRHAPHY, HX OF 02/20/2009  . EPISTAXIS, RECURRENT 11/19/2008  . UNS ADVRS EFF UNS RX MEDICINAL&BIOLOGICAL SBSTNC 06/12/2008  . OTHER AND UNSPECIFIED MITRAL VALVE DISEASES 03/12/2008  . ATRIAL FLUTTER 03/12/2008  . Essential hypertension 03/06/2007  . PEPTIC ULCER DISEASE 03/06/2007  . BENIGN PROSTATIC HYPERTROPHY 03/06/2007  . Coronary atherosclerosis 02/13/2007  . ROSACEA 02/13/2007  . TRANSIENT ISCHEMIC ATTACK, HX OF 02/13/2007  . DVT, HX OF 02/13/2007   OCCUPATIONAL THERAPY DISCHARGE SUMMARY  Visits from Start of Care: 6  Current functional level related to goals / functional outcomes: See above   Remaining deficits: Balance, LUE weakness,    Education / Equipment: HEP  Plan: Patient agrees to discharge.  Patient goals were met. Patient is being discharged due to meeting the stated rehab goals.  ?????     Quay Burow , OTR/L 09/24/16, 4:29 PM  [facility] [address]  [place], [place], [address] Phone: [phone]   Fax:  [phone]  Name: DELFINO FRIESEN MRN: [id number] Date of Birth: Dec 28, 1932

## 2016-09-05 NOTE — Telephone Encounter (Signed)
Please see message below

## 2016-09-06 NOTE — Therapy (Signed)
[facility] [address] [place], [place], [address] Phone: [phone]   Fax:  [phone]  Physical Therapy Treatment  Patient Details  Name: Brian [name] MRN: [id number] Date of Birth: [birth year] Referring Provider: Antony Contras, MD  Encounter Date: 09/05/2016      PT End of Session - 09/05/16 1710    Visit Number 6   Number of Visits 17   Date for PT Re-Evaluation 10/13/16   Authorization Type Medicare G-Code & progress note   PT Start Time 1400   PT Stop Time 1444   PT Time Calculation (min) 44 min   Equipment Utilized During Treatment Gait belt   Activity Tolerance Patient tolerated treatment well;Patient limited by fatigue   Behavior During Therapy [facility] for tasks assessed/performed      Past Medical History:  Diagnosis Date  . Anemia   . Atrial flutter ([place])   . BPH (benign prostatic hypertrophy)   . Epistaxis   . Hemorrhage of gastrointestinal tract, unspecified   . Hypertension   . Osteoarthrosis, unspecified whether generalized or localized, unspecified site   . Personal history of venous thrombosis and embolism   . Rosacea   . Stroke ([place])   . TIA (transient ischemic attack)     Past Surgical History:  Procedure Laterality Date  . APPENDECTOMY    . HERNIA REPAIR    . IR GENERIC HISTORICAL  05/04/2016   IR PERCUTANEOUS ART THROMBECTOMY/INFUSION INTRACRANIAL INC DIAG ANGIO 05/04/2016 [name], MD [facility]  . IR GENERIC HISTORICAL  05/04/2016   IR ANGIO VERTEBRAL SEL SUBCLAVIAN INNOMINATE UNI R MOD SED 05/04/2016 [name], MD [facility]  . IR GENERIC HISTORICAL  06/07/2016   IR RADIOLOGIST EVAL & MGMT 06/07/2016 [facility]  . JOINT REPLACEMENT    . MITRAL VALVE REPAIR    . mvp repair    . RADIOLOGY WITH ANESTHESIA N/A 05/04/2016   Procedure: RADIOLOGY WITH ANESTHESIA;  Surgeon: [name], MD;  Location: [facility];  Service: Radiology;  Laterality: N/A;  . TOTAL  HIP ARTHROPLASTY      There were no vitals filed for this visit.      Subjective Assessment - 09/05/16 1358    Subjective He heard from [place] today & can pick up w/c today.    Patient is accompained by: --  CNA   Limitations Lifting;Standing;Walking;House hold activities   Patient Stated Goals To get balance & walking back to pre stroke level   Currently in Pain? Yes   Pain Score 7    Pain Location Toe (Comment which one)  2nd toe   Pain Orientation Left   Pain Descriptors / Indicators Sore   Pain Type Acute pain   Pain Onset In the past 7 days   Pain Frequency Constant   Aggravating Factors  son cut toenails but was hurting    Pain Relieving Factors rest,    Multiple Pain Sites Yes   Pain Score 6   Pain Location Knee   Pain Orientation Right;Left   Pain Descriptors / Indicators Aching   Pain Type Chronic pain   Pain Onset More than a month ago   Pain Frequency Constant   Aggravating Factors  walking, sitting too long   Pain Relieving Factors lay down, sitting for short periods     Neuromuscular Re-education: Balance & posture with tactile cues standing with RW support: alternate hip flexion/forward kicks, lateral stepping & backwards stepping 10 reps each.   Therapeutic  Exercise:  NuStep recumbent stepper with BUEs & BLEs Level 3 for 10 min with cues on full ROM.  Gait Training: Pt ambulated 75' X 3 with std RW with minA & verbal / tactile cues on step length, posture & wt shifts. Pt ambulated 38' with rollator walker with modA & PT manually controlling rollator walker.  Pt negotiated ramp & curb with std RW with modA and cues on technique.                               PT Short Term Goals - 08/24/16 1556      PT SHORT TERM GOAL #1   Title Patient demonstrates understanding of initial HEP with CNA cueing correctly.  (Target Date: 09/15/2016)   Time 4   Period Weeks   Status On-going     PT SHORT TERM GOAL #2   Title  Patient reaches 7" anteriorly and within 10" of floor with RW support with supervision. (Target Date: 09/15/2016)   Time 4   Period Weeks   Status On-going     PT SHORT TERM GOAL #3   Title Patient ambulates 200' with RW with supervision. (Target Date: 09/15/2016)   Time 4   Period Weeks   Status On-going     PT SHORT TERM GOAL #4   Title Patient negotiates ramps & curbs with RW with minimal guard. (Target Date: 09/15/2016)   Time 4   Period Weeks   Status On-going           PT Long Term Goals - 08/24/16 1556      PT LONG TERM GOAL #1   Title Patient & CNA demonstrate understanding of HEP. (Target Date: 10/13/2016)   Time 8   Period Weeks   Status On-going     PT LONG TERM GOAL #2   Title Patient able to balance in standing with RW support reaching 10" and manages clothes safely.  (Target Date: 10/13/2016)    Time 8   Period Weeks   Status On-going     PT LONG TERM GOAL #3   Title Patient ambulates 300' with RW with supervision for community mobility  (Target Date: 10/13/2016)   Time 8   Period Weeks   Status On-going     PT LONG TERM GOAL #4   Title Patient negotiates ramps & curbs with RW and stairs with 2 rails with supervision for community access.  (Target Date: 10/13/2016)   Time 8   Period Weeks   Status On-going     PT LONG TERM GOAL #5   Title Patient ambulates with RW around furniture 100' modified independent for household mobility.  (Target Date: 10/13/2016)   Time 8   Period Weeks   Status On-going               Plan - 09/05/16 1711    Clinical Impression Statement PT assessed gait with both standard RW & rollator walker. Patient reports he used rollator prior to CVA. Rollator walker does not appear safe for patient to use with gait deviations & balance now.    Rehab Potential Good   PT Frequency 2x / week   PT Duration 8 weeks   PT Treatment/Interventions ADLs/Self Care Home Management;DME Instruction;Gait training;Stair training;Functional  mobility training;Therapeutic activities;Therapeutic exercise;Balance training;Neuromuscular re-education;Patient/family education   PT Next Visit Plan Assess STGs, gait & balance activities.    Consulted and Agree with Plan of Care Patient;Family  member/caregiver   Family Member Consulted CNA      Patient will benefit from skilled therapeutic intervention in order to improve the following deficits and impairments:  Abnormal gait, Decreased activity tolerance, Decreased balance, Decreased coordination, Decreased knowledge of use of DME, Decreased mobility, Decreased range of motion, Decreased strength, Postural dysfunction, Pain  Visit Diagnosis: Hemiplegia and hemiparesis following cerebral infarction affecting left non-dominant side (HCC)  Unsteadiness on feet  Muscle weakness (generalized)  Other abnormalities of gait and mobility  Paralytic gait     Problem List Patient Active Problem List   Diagnosis Date Noted  . Cerebrovascular accident (CVA) due to thrombosis of right middle cerebral artery ([place])   . Benign essential HTN   . TIA (transient ischemic attack)   . Coronary artery disease involving native coronary artery of native heart without angina pectoris   . H/O mitral valve repair   . Tachypnea   . Paroxysmal atrial fibrillation (HCC)   . Hypokalemia   . Acute blood loss anemia   . Thrombocytopenia ([place])   . Dysphagia   . Dysarthria, post-stroke   . Dysphagia, post-stroke   . Acute respiratory failure ([place])   . Acute CVA (cerebrovascular accident) ([place])   . Cerebrovascular accident (CVA) due to thrombosis of precerebral artery ([place])   . Stroke (cerebrum) ([facility])   . Arterial hypotension   . Hypocalcemia   . Scoliosis (and kyphoscoliosis), idiopathic 01/26/2014  . ROTATOR CUFF SYNDROME, LEFT 03/07/2010  . OTH MALIG NEOPLASM SKIN OTH&UNSPEC PARTS FACE 12/06/2009  . ACTINIC KERATOSIS, HEAD 09/06/2009  . Osteoarthritis 02/20/2009  . SYNCOPE, HX OF 02/20/2009  .  MITRAL VALVE REPLACEMENT, HX OF 02/20/2009  . HIP REPLACEMENT, TOTAL, HX OF 02/20/2009  . APPENDECTOMY, HX OF 02/20/2009  . HERNIORRHAPHY, HX OF 02/20/2009  . EPISTAXIS, RECURRENT 11/19/2008  . UNS ADVRS EFF UNS RX MEDICINAL&BIOLOGICAL SBSTNC 06/12/2008  . OTHER AND UNSPECIFIED MITRAL VALVE DISEASES 03/12/2008  . ATRIAL FLUTTER 03/12/2008  . Essential hypertension 03/06/2007  . PEPTIC ULCER DISEASE 03/06/2007  . BENIGN PROSTATIC HYPERTROPHY 03/06/2007  . Coronary atherosclerosis 02/13/2007  . ROSACEA 02/13/2007  . TRANSIENT ISCHEMIC ATTACK, HX OF 02/13/2007  . DVT, HX OF 02/13/2007    Jamey Reas PT, DPT 09/06/2016, 5:16 PM  [facility] [address] [place], [place], [address] Phone: [phone]   Fax:  [phone]  Name: Brian [name] MRN: [id number] Date of Birth: 10-10-1932

## 2016-09-07 ENCOUNTER — Encounter: Payer: Medicare Other | Admitting: Occupational Therapy

## 2016-09-07 ENCOUNTER — Ambulatory Visit: Payer: Medicare Other | Admitting: Physical Therapy

## 2016-09-08 ENCOUNTER — Other Ambulatory Visit: Payer: Self-pay | Admitting: Internal Medicine

## 2016-09-08 ENCOUNTER — Telehealth: Payer: Self-pay | Admitting: Internal Medicine

## 2016-09-08 MED ORDER — ALPRAZOLAM 0.25 MG PO TABS: 0.2500 mg | ORAL_TABLET | Freq: Every day | ORAL | 0 refills | Status: DC

## 2016-09-08 NOTE — Telephone Encounter (Signed)
[facility] pharmacy in [place] and [place] were call to verify if Xanax 0.25mg  was pick up on 08/15/16. Rx was not picked up.  Rx was phoned in to [facility] located in [place]. Called pt's son to inform him that RX was called in, Brian [name] verbalized understanding.

## 2016-09-08 NOTE — Telephone Encounter (Signed)
[facility] pharmacy in [place] and [place] were call to verify if Xanax 0.25mg  was pick up on 08/15/16. Rx was not picked up.  Rx was phoned in to [facility] located in [place]. Called pt's son to inform him that RX was called in, Motty Spano verbalized understanding.

## 2016-09-08 NOTE — Telephone Encounter (Signed)
Spoke with both Son and Pt, they did not pick up Rx that was sent to [facility] on 08/15/16.

## 2016-09-08 NOTE — Telephone Encounter (Signed)
° °  Pt son said that he did not pick up the RX that was called in on 08/15/16 and is asking if the below med can be called in he only has 4 pills left.    ALPRAZolam (XANAX) 0.25 MG tablet   [facility]

## 2016-09-08 NOTE — Telephone Encounter (Signed)
Pts son called stating that the pt is out of his medication of alprazolam and have to have it today b/c the pt is out.

## 2016-09-12 ENCOUNTER — Encounter: Payer: Medicare Other | Admitting: Occupational Therapy

## 2016-09-12 ENCOUNTER — Ambulatory Visit: Payer: Medicare Other | Admitting: Physical Therapy

## 2016-09-12 ENCOUNTER — Encounter: Payer: Self-pay | Admitting: Physical Therapy

## 2016-09-12 ENCOUNTER — Other Ambulatory Visit: Payer: Self-pay | Admitting: Internal Medicine

## 2016-09-12 DIAGNOSIS — R278 Other lack of coordination: Secondary | ICD-10-CM | POA: Diagnosis not present

## 2016-09-12 DIAGNOSIS — M6281 Muscle weakness (generalized): Secondary | ICD-10-CM | POA: Diagnosis not present

## 2016-09-12 DIAGNOSIS — R2681 Unsteadiness on feet: Secondary | ICD-10-CM | POA: Diagnosis not present

## 2016-09-12 DIAGNOSIS — I69354 Hemiplegia and hemiparesis following cerebral infarction affecting left non-dominant side: Secondary | ICD-10-CM | POA: Diagnosis not present

## 2016-09-12 DIAGNOSIS — R2689 Other abnormalities of gait and mobility: Secondary | ICD-10-CM

## 2016-09-12 DIAGNOSIS — R261 Paralytic gait: Secondary | ICD-10-CM | POA: Diagnosis not present

## 2016-09-13 ENCOUNTER — Telehealth: Payer: Self-pay | Admitting: Internal Medicine

## 2016-09-13 DIAGNOSIS — M79675 Pain in left toe(s): Secondary | ICD-10-CM | POA: Diagnosis not present

## 2016-09-13 DIAGNOSIS — L97521 Non-pressure chronic ulcer of other part of left foot limited to breakdown of skin: Secondary | ICD-10-CM | POA: Diagnosis not present

## 2016-09-13 NOTE — Telephone Encounter (Signed)
° ° ° °  Pt would like a call concerning the below med  escitalopram (LEXAPRO) 10 MG tablet  Pt is asking to change to ZOLOFT

## 2016-09-13 NOTE — Therapy (Signed)
[facility] [address] [place], [place], [address] Phone: [phone]   Fax:  [phone]  Physical Therapy Treatment  Patient Details  Name: Brian [name] MRN: [id number] Date of Birth: [birth year] Referring Provider: Antony Contras, MD  Encounter Date: 09/12/2016      PT End of Session - 09/12/16 1413    Visit Number 7   Number of Visits 17   Date for PT Re-Evaluation 10/13/16   Authorization Type Medicare G-Code & progress note   PT Start Time 1402   PT Stop Time 1445   PT Time Calculation (min) 43 min   Equipment Utilized During Treatment Gait belt   Activity Tolerance Patient tolerated treatment well;Patient limited by fatigue   Behavior During Therapy [facility] for tasks assessed/performed      Past Medical History:  Diagnosis Date  . Anemia   . Atrial flutter ([place])   . BPH (benign prostatic hypertrophy)   . Epistaxis   . Hemorrhage of gastrointestinal tract, unspecified   . Hypertension   . Osteoarthrosis, unspecified whether generalized or localized, unspecified site   . Personal history of venous thrombosis and embolism   . Rosacea   . Stroke ([place])   . TIA (transient ischemic attack)     Past Surgical History:  Procedure Laterality Date  . APPENDECTOMY    . HERNIA REPAIR    . IR GENERIC HISTORICAL  05/04/2016   IR PERCUTANEOUS ART THROMBECTOMY/INFUSION INTRACRANIAL INC DIAG ANGIO 05/04/2016 [name], MD [facility]  . IR GENERIC HISTORICAL  05/04/2016   IR ANGIO VERTEBRAL SEL SUBCLAVIAN INNOMINATE UNI R MOD SED 05/04/2016 [name], MD [facility]  . IR GENERIC HISTORICAL  06/07/2016   IR RADIOLOGIST EVAL & MGMT 06/07/2016 [facility]  . JOINT REPLACEMENT    . MITRAL VALVE REPAIR    . mvp repair    . RADIOLOGY WITH ANESTHESIA N/A 05/04/2016   Procedure: RADIOLOGY WITH ANESTHESIA;  Surgeon: [name], MD;  Location: [facility];  Service: Radiology;  Laterality: N/A;  . TOTAL  HIP ARTHROPLASTY      There were no vitals filed for this visit.      Subjective Assessment - 09/12/16 1408    Subjective No new falls to report. Reports his left knee and toe are hurting "bad" today, "i don't think i can walk back, can we use the wheelchair". Pt transported to gym from lobby by wheelchair.   Patient is accompained by: --  CNA   Limitations Lifting;Standing;Walking;House hold activities   Patient Stated Goals To get balance & walking back to pre stroke level   Currently in Pain? Yes   Pain Score 7    Pain Location Knee  and 2cd toe   Pain Descriptors / Indicators Aching;Sore   Pain Type Chronic pain   Pain Onset More than a month ago   Pain Frequency Constant   Aggravating Factors  OA   Pain Relieving Factors rest           [facility] Adult PT Treatment/Exercise - 09/12/16 1415      Ambulation/Gait   Ambulation/Gait Yes   Ambulation/Gait Assistance 4: Min assist   Ambulation/Gait Assistance Details cues needed for walker position as pt tends to push walker too far out, cues on increased bil step length (step right to red, left to yellow bands at bottom of waleker), and cues for increased step height with swing phase (left > right)   Ambulation Distance (Feet) 100 Feet  Assistive device Rolling walker   Gait Pattern Step-to pattern;Decreased step length - right;Decreased stance time - left;Decreased stride length;Decreased hip/knee flexion - left;Decreased weight shift to left;Left circumduction;Left flexed knee in stance;Antalgic;Trunk flexed;Poor foot clearance - left   Ambulation Surface Level;Indoor     Knee/Hip Exercises: Aerobic   Nustep with UE's/LE's level 4 x 10 minutes with goal >/= 20 steps per minute for strengthening and activity tolerance            PT Short Term Goals - 09/12/16 1414      PT SHORT TERM GOAL #1   Title Patient demonstrates understanding of initial HEP with CNA cueing correctly.  (Target Date: 09/15/2016)   Time 4   Period  Weeks   Status On-going     PT SHORT TERM GOAL #2   Title Patient reaches 7" anteriorly and within 10" of floor with RW support with supervision. (Target Date: 09/15/2016)   Time 4   Period Weeks   Status On-going     PT SHORT TERM GOAL #3   Title Patient ambulates 200' with RW with supervision. (Target Date: 09/15/2016)   Baseline 09/12/16: 100 feet was max distance pt was able to achieve with RW   Time --   Period --   Status Not Met     PT SHORT TERM GOAL #4   Title Patient negotiates ramps & curbs with RW with minimal guard. (Target Date: 09/15/2016)   Time 4   Period Weeks   Status On-going           PT Long Term Goals - 08/24/16 1556      PT LONG TERM GOAL #1   Title Patient & CNA demonstrate understanding of HEP. (Target Date: 10/13/2016)   Time 8   Period Weeks   Status On-going     PT LONG TERM GOAL #2   Title Patient able to balance in standing with RW support reaching 10" and manages clothes safely.  (Target Date: 10/13/2016)    Time 8   Period Weeks   Status On-going     PT LONG TERM GOAL #3   Title Patient ambulates 300' with RW with supervision for community mobility  (Target Date: 10/13/2016)   Time 8   Period Weeks   Status On-going     PT LONG TERM GOAL #4   Title Patient negotiates ramps & curbs with RW and stairs with 2 rails with supervision for community access.  (Target Date: 10/13/2016)   Time 8   Period Weeks   Status On-going     PT LONG TERM GOAL #5   Title Patient ambulates with RW around furniture 100' modified independent for household mobility.  (Target Date: 10/13/2016)   Time 8   Period Weeks   Status On-going            Plan - 09/12/16 1413    Clinical Impression Statement Today's session addressed progress toward STGs. Progress check was limited due to pt with increased knee pain today and caregiver not present to check 1st goal. Will check remaining goals next session.   Rehab Potential Good   PT Frequency 2x / week   PT  Duration 8 weeks   PT Treatment/Interventions ADLs/Self Care Home Management;DME Instruction;Gait training;Stair training;Functional mobility training;Therapeutic activities;Therapeutic exercise;Balance training;Neuromuscular re-education;Patient/family education   PT Next Visit Plan check remaining STGs, gait & balance activities.    Consulted and Agree with Plan of Care Patient;Family member/caregiver   Family Member Consulted CNA  Patient will benefit from skilled therapeutic intervention in order to improve the following deficits and impairments:  Abnormal gait, Decreased activity tolerance, Decreased balance, Decreased coordination, Decreased knowledge of use of DME, Decreased mobility, Decreased range of motion, Decreased strength, Postural dysfunction, Pain  Visit Diagnosis: Hemiplegia and hemiparesis following cerebral infarction affecting left non-dominant side (HCC)  Unsteadiness on feet  Muscle weakness (generalized)  Other abnormalities of gait and mobility     Problem List Patient Active Problem List   Diagnosis Date Noted  . Cerebrovascular accident (CVA) due to thrombosis of right middle cerebral artery ([place])   . Benign essential HTN   . TIA (transient ischemic attack)   . Coronary artery disease involving native coronary artery of native heart without angina pectoris   . H/O mitral valve repair   . Tachypnea   . Paroxysmal atrial fibrillation (HCC)   . Hypokalemia   . Acute blood loss anemia   . Thrombocytopenia ([place])   . Dysphagia   . Dysarthria, post-stroke   . Dysphagia, post-stroke   . Acute respiratory failure ([place])   . Acute CVA (cerebrovascular accident) ([place])   . Cerebrovascular accident (CVA) due to thrombosis of precerebral artery ([place])   . Stroke (cerebrum) ([place])   . Arterial hypotension   . Hypocalcemia   . Scoliosis (and kyphoscoliosis), idiopathic 01/26/2014  . ROTATOR CUFF SYNDROME, LEFT 03/07/2010  . OTH MALIG NEOPLASM SKIN OTH&UNSPEC  PARTS FACE 12/06/2009  . ACTINIC KERATOSIS, HEAD 09/06/2009  . Osteoarthritis 02/20/2009  . SYNCOPE, HX OF 02/20/2009  . MITRAL VALVE REPLACEMENT, HX OF 02/20/2009  . HIP REPLACEMENT, TOTAL, HX OF 02/20/2009  . APPENDECTOMY, HX OF 02/20/2009  . HERNIORRHAPHY, HX OF 02/20/2009  . EPISTAXIS, RECURRENT 11/19/2008  . UNS ADVRS EFF UNS RX MEDICINAL&BIOLOGICAL SBSTNC 06/12/2008  . OTHER AND UNSPECIFIED MITRAL VALVE DISEASES 03/12/2008  . ATRIAL FLUTTER 03/12/2008  . Essential hypertension 03/06/2007  . PEPTIC ULCER DISEASE 03/06/2007  . BENIGN PROSTATIC HYPERTROPHY 03/06/2007  . Coronary atherosclerosis 02/13/2007  . ROSACEA 02/13/2007  . TRANSIENT ISCHEMIC ATTACK, HX OF 02/13/2007  . DVT, HX OF 02/13/2007    Willow Ora, PTA, [facility] [address], [place], [place] [address] [phone] 09/13/16, 12:19 PM   Name: Brian [name] MRN: [id number] Date of Birth: 05-Feb-1933

## 2016-09-14 ENCOUNTER — Encounter: Payer: Medicare Other | Admitting: Occupational Therapy

## 2016-09-14 ENCOUNTER — Ambulatory Visit: Payer: Medicare Other | Admitting: Physical Therapy

## 2016-09-14 NOTE — Telephone Encounter (Signed)
See message below, please advise.

## 2016-09-15 ENCOUNTER — Other Ambulatory Visit: Payer: Self-pay | Admitting: Internal Medicine

## 2016-09-15 MED ORDER — SERTRALINE HCL 25 MG PO TABS: 25.0000 mg | ORAL_TABLET | Freq: Every day | ORAL | 5 refills | Status: DC

## 2016-09-15 MED ORDER — SERTRALINE HCL 25 MG PO TABS
25.0000 mg | ORAL_TABLET | Freq: Every day | ORAL | 5 refills | Status: DC
Start: 2016-09-15 — End: 2017-01-16

## 2016-09-15 NOTE — Telephone Encounter (Signed)
Okay to change to Zoloft 25 mg every morning

## 2016-09-15 NOTE — Telephone Encounter (Signed)
Rx sent over to pharmacy. Spoke with pt and made pt and spouse aware of medication change. Pt verbalized understanding.

## 2016-09-18 ENCOUNTER — Telehealth: Payer: Self-pay | Admitting: Neurology

## 2016-09-18 NOTE — Telephone Encounter (Signed)
Patients [name] called requesting refill for patients apixaban (ELIQUIS) 2.5 MG TABS tablet.

## 2016-09-18 NOTE — Telephone Encounter (Signed)
Patient wants refill on elqiuis, last seen 04/2017 has appt in June 2018. Was suppose to follow in 6 months from 04/2017. Medication done while in the hospital.

## 2016-09-19 ENCOUNTER — Ambulatory Visit: Payer: Medicare Other | Admitting: Physical Therapy

## 2016-09-19 ENCOUNTER — Encounter: Payer: Self-pay | Admitting: Physical Therapy

## 2016-09-19 ENCOUNTER — Encounter: Payer: Medicare Other | Admitting: Occupational Therapy

## 2016-09-19 ENCOUNTER — Other Ambulatory Visit: Payer: Self-pay

## 2016-09-19 DIAGNOSIS — M6281 Muscle weakness (generalized): Secondary | ICD-10-CM

## 2016-09-19 DIAGNOSIS — R261 Paralytic gait: Secondary | ICD-10-CM | POA: Diagnosis not present

## 2016-09-19 DIAGNOSIS — I69354 Hemiplegia and hemiparesis following cerebral infarction affecting left non-dominant side: Secondary | ICD-10-CM | POA: Diagnosis not present

## 2016-09-19 DIAGNOSIS — R2689 Other abnormalities of gait and mobility: Secondary | ICD-10-CM | POA: Diagnosis not present

## 2016-09-19 DIAGNOSIS — R2681 Unsteadiness on feet: Secondary | ICD-10-CM

## 2016-09-19 DIAGNOSIS — R278 Other lack of coordination: Secondary | ICD-10-CM | POA: Diagnosis not present

## 2016-09-19 MED ORDER — APIXABAN 2.5 MG PO TABS
2.5000 mg | ORAL_TABLET | Freq: Two times a day (BID) | ORAL | 1 refills | Status: DC
Start: 2016-09-19 — End: 2016-11-28

## 2016-09-19 NOTE — Therapy (Signed)
[facility] [address] [place], [place], [address] Phone: [phone]   Fax:  [phone]  Physical Therapy Treatment  Patient Details  Name: Brian [name] MRN: [id number] Date of Birth: 07/20/1933 Referring Provider: Antony Contras, MD  Encounter Date: 09/19/2016      PT End of Session - 09/19/16 1514    Visit Number 8   Number of Visits 17   Date for PT Re-Evaluation 10/13/16   Authorization Type Medicare G-Code & progress note 10th visit   PT Start Time 1407   PT Stop Time 1500   PT Time Calculation (min) 53 min   Activity Tolerance Patient tolerated treatment well   Behavior During Therapy [facility] for tasks assessed/performed      Past Medical History:  Diagnosis Date  . Anemia   . Atrial flutter ([place])   . BPH (benign prostatic hypertrophy)   . Epistaxis   . Hemorrhage of gastrointestinal tract, unspecified   . Hypertension   . Osteoarthrosis, unspecified whether generalized or localized, unspecified site   . Personal history of venous thrombosis and embolism   . Rosacea   . Stroke ([place])   . TIA (transient ischemic attack)     Past Surgical History:  Procedure Laterality Date  . APPENDECTOMY    . HERNIA REPAIR    . IR GENERIC HISTORICAL  05/04/2016   IR PERCUTANEOUS ART THROMBECTOMY/INFUSION INTRACRANIAL INC DIAG ANGIO 05/04/2016 [name], MD [facility]  . IR GENERIC HISTORICAL  05/04/2016   IR ANGIO VERTEBRAL SEL SUBCLAVIAN INNOMINATE UNI R MOD SED 05/04/2016 [name], MD [facility]  . IR GENERIC HISTORICAL  06/07/2016   IR RADIOLOGIST EVAL & MGMT 06/07/2016 [facility]  . JOINT REPLACEMENT    . MITRAL VALVE REPAIR    . mvp repair    . RADIOLOGY WITH ANESTHESIA N/A 05/04/2016   Procedure: RADIOLOGY WITH ANESTHESIA;  Surgeon: [name], MD;  Location: [facility];  Service: Radiology;  Laterality: N/A;  . TOTAL HIP ARTHROPLASTY      There were no vitals filed for this  visit.      Subjective Assessment - 09/19/16 1416    Subjective No new falls; reports going to doctor to have toe looked at.  Reports an "ulcer" between L great toe and second toe.  Still reporting some pain in L toes and able to ambulate from lobby today.  Also having OA pain in L knee.   Patient is accompained by: --  CNA   Limitations Lifting;Standing;Walking;House hold activities   Patient Stated Goals To get balance & walking back to pre stroke level; used rollator    Currently in Pain? Yes   Pain Score 5    Pain Location Foot   Pain Orientation Left   Pain Descriptors / Indicators Sore;Aching   Pain Type Chronic pain          [facility] Adult PT Treatment/Exercise - 09/19/16 1506      Ambulation/Gait   Ambulation/Gait Yes   Ambulation/Gait Assistance 5: Supervision   Ambulation/Gait Assistance Details cues needed for more upright posture (to tolerance), to maintain safe distance to RW and for full R lateral weight shift, full LLE clearance and step length to heel strike   Ambulation Distance (Feet) 100 Feet  x 2   Assistive device Rolling walker   Ambulation Surface Level;Indoor     Self-Care   Self-Care Other Self-Care Comments   Other Self-Care Comments  Long discussion with pt and CNA regarding  pt's compliance with HEP and mobility at home.  Educated on effects of immobility and the need for continued activity at home for carry over of gains made in therapy and to continue to progress towards pt's goals.         Exercises   Exercises Ankle     Knee/Hip Exercises: Standing   Heel Raises Both;1 set;10 reps   Heel Raises Limitations bilat UE support with 5 second hold at top   SLS R and LLE with 10 second hold, x 5 reps each LE.  Bilat UE support     Ankle Exercises: Standing   Toe Raise 10 reps   Toe Raise Limitations bilat UE support on table             Balance Exercises - 09/19/16 1510      Balance Exercises: Standing   Sidestepping 4 reps;Other (comment)   along table with bilat UE support   Marching Limitations 4 reps along table with one UE support and mod A when holding with LUE support due to pt fear of weight shifting and WB through LUE and LLE           PT Education - 09/19/16 1513    Education provided Yes   Education Details goals met and unmet, progress, review of HEP and importance of compliance with HEP and maintaining activity at home   Person(s) Educated Patient;Caregiver(s)   Methods Explanation;Demonstration;Handout   Comprehension Verbalized understanding;Returned demonstration          PT Short Term Goals - 09/19/16 1443      PT SHORT TERM GOAL #1   Title Patient demonstrates understanding of initial HEP with CNA cueing correctly.  (Addressed 09/19/16)   Time 4   Period Weeks   Status Not Met     PT SHORT TERM GOAL #2   Title Patient reaches 7" anteriorly and within 10" of floor with RW support with supervision. (Addressed 09/19/16)   Time 4   Period Weeks   Status Achieved     PT SHORT TERM GOAL #3   Title Patient ambulates 200' with RW with supervision. (Addressed 09/12/16)   Baseline 09/12/16: 100 feet was max distance pt was able to achieve with RW; requires min A at times   Status Not Met     PT SHORT TERM GOAL #4   Title Patient negotiates ramps & curbs with RW with minimal guard. (Performed on 09/05/16)   Baseline Required mod A   Time 4   Period Weeks   Status Not Met           PT Long Term Goals - 08/24/16 1556      PT LONG TERM GOAL #1   Title Patient & CNA demonstrate understanding of HEP. (Target Date: 10/13/2016)   Time 8   Period Weeks   Status On-going     PT LONG TERM GOAL #2   Title Patient able to balance in standing with RW support reaching 10" and manages clothes safely.  (Target Date: 10/13/2016)    Time 8   Period Weeks   Status On-going     PT LONG TERM GOAL #3   Title Patient ambulates 300' with RW with supervision for community mobility  (Target Date: 10/13/2016)    Time 8   Period Weeks   Status On-going     PT LONG TERM GOAL #4   Title Patient negotiates ramps & curbs with RW and stairs with 2 rails with supervision for  community access.  (Target Date: 10/13/2016)   Time 8   Period Weeks   Status On-going     PT LONG TERM GOAL #5   Title Patient ambulates with RW around furniture 100' modified independent for household mobility.  (Target Date: 10/13/2016)   Time 8   Period Weeks   Status On-going               Plan - 09/19/16 1515    Clinical Impression Statement Continued to perform re-assessment of STGs including gait with RW, HEP review, measurement of pt's ability to reach out of BOS forwards x 7" and down to floor within 5" of floor.  Pt showed improvement in gait sequence after performing exercises and demonstrated improved weight shifting, L foot clearance and heel strike with supervision-as pt fatigued he began to take attention off gait and to chair resulting in pt returning to L foot drag and increased trunk flexion requiring min A to safely pivot to chair.  Engaged pt and caregiver in long discussion about compliance with HEP and activity at home.  Caregiver feels pt does not get OOB much during the day due to pain and he likes to stay away from his wife.  Pt reports he stays in bed due to fatigue from not sleeping well at night (up/down urinating due to BPH).  Discussed taking some rest breaks during the day but spending most of the time OOB and performing exercises 1-2x/day.  Pt met 1 out of 4 STG.     PT Treatment/Interventions ADLs/Self Care Home Management;DME Instruction;Gait training;Stair training;Functional mobility training;Therapeutic activities;Therapeutic exercise;Balance training;Neuromuscular re-education;Patient/family education   PT Next Visit Plan Next session is 9th visit-needs progress note and G code on 10th visit; continue to work on standing balance especially weight shifting forwards, laterally to L, gait with RW  and L foot advancement   Consulted and Agree with Plan of Care Patient;Family member/caregiver   Family Member Consulted CNA      Patient will benefit from skilled therapeutic intervention in order to improve the following deficits and impairments:  Abnormal gait, Decreased activity tolerance, Decreased balance, Decreased coordination, Decreased knowledge of use of DME, Decreased mobility, Decreased range of motion, Decreased strength, Postural dysfunction, Pain  Visit Diagnosis: Hemiplegia and hemiparesis following cerebral infarction affecting left non-dominant side (HCC)  Unsteadiness on feet  Muscle weakness (generalized)  Other abnormalities of gait and mobility  Paralytic gait     Problem List Patient Active Problem List   Diagnosis Date Noted  . Cerebrovascular accident (CVA) due to thrombosis of right middle cerebral artery ([place])   . Benign essential HTN   . TIA (transient ischemic attack)   . Coronary artery disease involving native coronary artery of native heart without angina pectoris   . H/O mitral valve repair   . Tachypnea   . Paroxysmal atrial fibrillation (HCC)   . Hypokalemia   . Acute blood loss anemia   . Thrombocytopenia ([place])   . Dysphagia   . Dysarthria, post-stroke   . Dysphagia, post-stroke   . Acute respiratory failure ([place])   . Acute CVA (cerebrovascular accident) ([place])   . Cerebrovascular accident (CVA) due to thrombosis of precerebral artery ([place])   . Stroke (cerebrum) ([place])   . Arterial hypotension   . Hypocalcemia   . Scoliosis (and kyphoscoliosis), idiopathic 01/26/2014  . ROTATOR CUFF SYNDROME, LEFT 03/07/2010  . OTH MALIG NEOPLASM SKIN OTH&UNSPEC PARTS FACE 12/06/2009  . ACTINIC KERATOSIS, HEAD 09/06/2009  . Osteoarthritis 02/20/2009  .  SYNCOPE, HX OF 02/20/2009  . MITRAL VALVE REPLACEMENT, HX OF 02/20/2009  . HIP REPLACEMENT, TOTAL, HX OF 02/20/2009  . APPENDECTOMY, HX OF 02/20/2009  . HERNIORRHAPHY, HX OF 02/20/2009  .  EPISTAXIS, RECURRENT 11/19/2008  . UNS ADVRS EFF UNS RX MEDICINAL&BIOLOGICAL SBSTNC 06/12/2008  . OTHER AND UNSPECIFIED MITRAL VALVE DISEASES 03/12/2008  . ATRIAL FLUTTER 03/12/2008  . Essential hypertension 03/06/2007  . PEPTIC ULCER DISEASE 03/06/2007  . BENIGN PROSTATIC HYPERTROPHY 03/06/2007  . Coronary atherosclerosis 02/13/2007  . ROSACEA 02/13/2007  . TRANSIENT ISCHEMIC ATTACK, HX OF 02/13/2007  . DVT, HX OF 02/13/2007   Raylene Everts, PT, DPT 09/19/16    3:30 PM    [facility] [address] [place], [place], [address] Phone: [phone]   Fax:  [phone]  Name: JALEEN FINCH MRN: [id number] Date of Birth: 05-11-1933

## 2016-09-19 NOTE — Telephone Encounter (Signed)
Patients [name] is not on the contact list to speak with. Rn spoke with Dr. Leonie Man about doing a refill. He will do a refill one time. Once the refill runs out he needs to seek his PCP or cardiologist for future refills.

## 2016-09-19 NOTE — Patient Instructions (Signed)
    Ankle Plantar Flexion / Dorsiflexion, Standing    Stand while holding a stable object. Rise up on toes. Then rock back on heels. Hold each position _5__ seconds. Repeat _10__ times per session. Do _2__ sessions per day.  Copyright  VHI. All rights reserved.   Single Leg - Eyes Open    Holding support, lift right leg while maintaining balance over other leg.. Hold__10__ seconds.  Repeat with Right leg lifted Repeat __5__ times per session. Do __2__ sessions per day.  Copyright  VHI. All rights reserved.    Side-Stepping    Holding onto counter for balance with TALL posture, side step toward one side and then side step back toward the other side. Repeat for 3 laps each way. 1-2 times a day.  Copyright  VHI. All rights reserved.   "I love a Youth worker onto counter top for balance: march forward along the counter top, then WALK (NO MARCHING) backwards to starting point. Repeat for 3 laps each way. 1-2 times a day.  Copyright  VHI. All rights reserved.

## 2016-09-21 ENCOUNTER — Ambulatory Visit: Payer: Medicare Other | Attending: Neurology | Admitting: Physical Therapy

## 2016-09-21 ENCOUNTER — Encounter: Payer: Self-pay | Admitting: Physical Therapy

## 2016-09-21 ENCOUNTER — Encounter: Payer: Medicare Other | Admitting: Occupational Therapy

## 2016-09-21 DIAGNOSIS — R2681 Unsteadiness on feet: Secondary | ICD-10-CM | POA: Insufficient documentation

## 2016-09-21 DIAGNOSIS — I69354 Hemiplegia and hemiparesis following cerebral infarction affecting left non-dominant side: Secondary | ICD-10-CM | POA: Diagnosis not present

## 2016-09-21 DIAGNOSIS — R278 Other lack of coordination: Secondary | ICD-10-CM | POA: Diagnosis not present

## 2016-09-21 DIAGNOSIS — R2689 Other abnormalities of gait and mobility: Secondary | ICD-10-CM | POA: Insufficient documentation

## 2016-09-21 DIAGNOSIS — M6281 Muscle weakness (generalized): Secondary | ICD-10-CM

## 2016-09-21 DIAGNOSIS — R261 Paralytic gait: Secondary | ICD-10-CM | POA: Insufficient documentation

## 2016-09-21 NOTE — Therapy (Signed)
[facility] [address] [place], [place], [address] Phone: [phone]   Fax:  [phone]  Physical Therapy Treatment  Patient Details  Name: Brian [name] MRN: [id number] Date of Birth: March 22, 1933 Referring Provider: Antony Contras, MD  Encounter Date: 09/21/2016      PT End of Session - 09/21/16 2341    Visit Number 9   Number of Visits 17   Date for PT Re-Evaluation 10/13/16   Authorization Type Medicare G-Code & progress note 10th visit   PT Start Time 1315   PT Stop Time 1410   PT Time Calculation (min) 55 min   Activity Tolerance Patient tolerated treatment well   Behavior During Therapy [facility] for tasks assessed/performed      Past Medical History:  Diagnosis Date  . Anemia   . Atrial flutter ([place])   . BPH (benign prostatic hypertrophy)   . Epistaxis   . Hemorrhage of gastrointestinal tract, unspecified   . Hypertension   . Osteoarthrosis, unspecified whether generalized or localized, unspecified site   . Personal history of venous thrombosis and embolism   . Rosacea   . Stroke ([place])   . TIA (transient ischemic attack)     Past Surgical History:  Procedure Laterality Date  . APPENDECTOMY    . HERNIA REPAIR    . IR GENERIC HISTORICAL  05/04/2016   IR PERCUTANEOUS ART THROMBECTOMY/INFUSION INTRACRANIAL INC DIAG ANGIO 05/04/2016 [name], MD [facility]  . IR GENERIC HISTORICAL  05/04/2016   IR ANGIO VERTEBRAL SEL SUBCLAVIAN INNOMINATE UNI R MOD SED 05/04/2016 [name], MD [facility]  . IR GENERIC HISTORICAL  06/07/2016   IR RADIOLOGIST EVAL & MGMT 06/07/2016 [facility]  . JOINT REPLACEMENT    . MITRAL VALVE REPAIR    . mvp repair    . RADIOLOGY WITH ANESTHESIA N/A 05/04/2016   Procedure: RADIOLOGY WITH ANESTHESIA;  Surgeon: [name], MD;  Location: [facility];  Service: Radiology;  Laterality: N/A;  . TOTAL HIP ARTHROPLASTY      There were no vitals filed for this  visit.      Subjective Assessment - 09/21/16 2338    Subjective 13:15  Patient requested PT to examine his toe as still hurting. He has an appt with Dr. Burnice Logan on 2/6.    Patient is accompained by: --  CNA   Limitations Lifting;Standing;Walking;House hold activities   Patient Stated Goals To get balance & walking back to pre stroke level; used rollator    Currently in Pain? Yes   Pain Score 6    Pain Location Toe (Comment which one)  Great toe & 2nd toe   Pain Orientation Distal;Proximal   Pain Descriptors / Indicators Aching;Sore   Pain Type Acute pain   Pain Onset 1 to 4 weeks ago   Pain Frequency Constant   Aggravating Factors  walking     Gait Training: Pt ambulated 100' X 2 with tactile & verbal cues on step length and tactile cues on weight shift. PT demo using targets on walker for step length not hip flexion.   Self-Care: Patient's left foot has redness at PIP of 1st & 2nd toes with tenderness pain with pressure & PROM. No warmth. Skin sloughing under 1st MTP but no wound. Toenails are hypertrophied with signs of decreased circulation. No signs of infection. Patient has Hallux Valgus & Hammer toe deformity. Pt reported his son bought size 9 1/2 shoes but he actually wears size 9. PT placed insert  from shoe against plantar surface of left foot. His MCP fit at narrowing portion of arch which would cause pressure from shoe.  PT instructed pt & CNA in proper fitting of shoe and relation to his foot pain. Patient required multiple repetitions of information.                             PT Education - 09/21/16 1315    Education provided Yes   Education Details Shoe fitting with proper length, width & extra depth.    Person(s) Educated Patient;Caregiver(s)   Methods Explanation;Demonstration;Verbal cues   Comprehension Verbalized understanding;Need further instruction          PT Short Term Goals - 09/19/16 1443      PT SHORT TERM GOAL #1    Title Patient demonstrates understanding of initial HEP with CNA cueing correctly.  (Addressed 09/19/16)   Time 4   Period Weeks   Status Not Met     PT SHORT TERM GOAL #2   Title Patient reaches 7" anteriorly and within 10" of floor with RW support with supervision. (Addressed 09/19/16)   Time 4   Period Weeks   Status Achieved     PT SHORT TERM GOAL #3   Title Patient ambulates 200' with RW with supervision. (Addressed 09/12/16)   Baseline 09/12/16: 100 feet was max distance pt was able to achieve with RW; requires min A at times   Status Not Met     PT SHORT TERM GOAL #4   Title Patient negotiates ramps & curbs with RW with minimal guard. (Performed on 09/05/16)   Baseline Required mod A   Time 4   Period Weeks   Status Not Met           PT Long Term Goals - 08/24/16 1556      PT LONG TERM GOAL #1   Title Patient & CNA demonstrate understanding of HEP. (Target Date: 10/13/2016)   Time 8   Period Weeks   Status On-going     PT LONG TERM GOAL #2   Title Patient able to balance in standing with RW support reaching 10" and manages clothes safely.  (Target Date: 10/13/2016)    Time 8   Period Weeks   Status On-going     PT LONG TERM GOAL #3   Title Patient ambulates 300' with RW with supervision for community mobility  (Target Date: 10/13/2016)   Time 8   Period Weeks   Status On-going     PT LONG TERM GOAL #4   Title Patient negotiates ramps & curbs with RW and stairs with 2 rails with supervision for community access.  (Target Date: 10/13/2016)   Time 8   Period Weeks   Status On-going     PT LONG TERM GOAL #5   Title Patient ambulates with RW around furniture 100' modified independent for household mobility.  (Target Date: 10/13/2016)   Time 8   Period Weeks   Status On-going               Plan - 09/21/16 2342    Clinical Impression Statement Pain in left foot appears related to wearing shoes that are too big, too narrow & too shallow. Pt requires  repetition of information with education. His caregiver verbalizes understanding proper shoe fitting and relation to foot pain. Patient improved gait with skilled instruction in step length.    Rehab Potential Good  PT Frequency 2x / week   PT Duration 8 weeks   PT Treatment/Interventions ADLs/Self Care Home Management;DME Instruction;Gait training;Stair training;Functional mobility training;Therapeutic activities;Therapeutic exercise;Balance training;Neuromuscular re-education;Patient/family education   PT Next Visit Plan G code on 10th visit reaching anterior & to floor with RW support; continue to work on standing balance especially weight shifting forwards, laterally to L, gait with RW and L foot advancement   Consulted and Agree with Plan of Care Patient;Family member/caregiver   Family Member Consulted CNA      Patient will benefit from skilled therapeutic intervention in order to improve the following deficits and impairments:  Abnormal gait, Decreased activity tolerance, Decreased balance, Decreased coordination, Decreased knowledge of use of DME, Decreased mobility, Decreased range of motion, Decreased strength, Postural dysfunction, Pain  Visit Diagnosis: Unsteadiness on feet  Muscle weakness (generalized)  Other abnormalities of gait and mobility  Paralytic gait     Problem List Patient Active Problem List   Diagnosis Date Noted  . Cerebrovascular accident (CVA) due to thrombosis of right middle cerebral artery ([place])   . Benign essential HTN   . TIA (transient ischemic attack)   . Coronary artery disease involving native coronary artery of native heart without angina pectoris   . H/O mitral valve repair   . Tachypnea   . Paroxysmal atrial fibrillation (HCC)   . Hypokalemia   . Acute blood loss anemia   . Thrombocytopenia ([place])   . Dysphagia   . Dysarthria, post-stroke   . Dysphagia, post-stroke   . Acute respiratory failure ([place])   . Acute CVA (cerebrovascular  accident) ([place])   . Cerebrovascular accident (CVA) due to thrombosis of precerebral artery ([place])   . Stroke (cerebrum) ([place])   . Arterial hypotension   . Hypocalcemia   . Scoliosis (and kyphoscoliosis), idiopathic 01/26/2014  . ROTATOR CUFF SYNDROME, LEFT 03/07/2010  . OTH MALIG NEOPLASM SKIN OTH&UNSPEC PARTS FACE 12/06/2009  . ACTINIC KERATOSIS, HEAD 09/06/2009  . Osteoarthritis 02/20/2009  . SYNCOPE, HX OF 02/20/2009  . MITRAL VALVE REPLACEMENT, HX OF 02/20/2009  . HIP REPLACEMENT, TOTAL, HX OF 02/20/2009  . APPENDECTOMY, HX OF 02/20/2009  . HERNIORRHAPHY, HX OF 02/20/2009  . EPISTAXIS, RECURRENT 11/19/2008  . UNS ADVRS EFF UNS RX MEDICINAL&BIOLOGICAL SBSTNC 06/12/2008  . OTHER AND UNSPECIFIED MITRAL VALVE DISEASES 03/12/2008  . ATRIAL FLUTTER 03/12/2008  . Essential hypertension 03/06/2007  . PEPTIC ULCER DISEASE 03/06/2007  . BENIGN PROSTATIC HYPERTROPHY 03/06/2007  . Coronary atherosclerosis 02/13/2007  . ROSACEA 02/13/2007  . TRANSIENT ISCHEMIC ATTACK, HX OF 02/13/2007  . DVT, HX OF 02/13/2007    Jamey Reas PT, DPT 09/21/2016, 11:51 PM  [facility] [address] [place], [place], [address] Phone: [phone]   Fax:  [phone]  Name: Brian [name] MRN: [id number] Date of Birth: 12-20-1932

## 2016-09-25 ENCOUNTER — Encounter: Payer: Medicare Other | Admitting: Occupational Therapy

## 2016-09-25 ENCOUNTER — Ambulatory Visit: Payer: Medicare Other | Admitting: Physical Therapy

## 2016-09-26 ENCOUNTER — Ambulatory Visit (INDEPENDENT_AMBULATORY_CARE_PROVIDER_SITE_OTHER): Payer: Medicare Other | Admitting: Internal Medicine

## 2016-09-26 ENCOUNTER — Encounter: Payer: Self-pay | Admitting: Internal Medicine

## 2016-09-26 VITALS — BP 120/68 | HR 74 | Temp 98.0°F | Ht 67.0 in | Wt 144.8 lb

## 2016-09-26 DIAGNOSIS — I10 Essential (primary) hypertension: Secondary | ICD-10-CM

## 2016-09-26 DIAGNOSIS — I6300 Cerebral infarction due to thrombosis of unspecified precerebral artery: Secondary | ICD-10-CM

## 2016-09-26 LAB — CBC WITH DIFFERENTIAL/PLATELET
BASOS ABS: 0 10*3/uL (ref 0.0–0.1)
Basophils Relative: 0.9 % (ref 0.0–3.0)
EOS ABS: 0.1 10*3/uL (ref 0.0–0.7)
Eosinophils Relative: 1.2 % (ref 0.0–5.0)
HEMATOCRIT: 44.7 % (ref 39.0–52.0)
Hemoglobin: 15.1 g/dL (ref 13.0–17.0)
LYMPHS ABS: 1.7 10*3/uL (ref 0.7–4.0)
LYMPHS PCT: 29.5 % (ref 12.0–46.0)
MCHC: 33.7 g/dL (ref 30.0–36.0)
MCV: 89 fl (ref 78.0–100.0)
Monocytes Absolute: 0.7 10*3/uL (ref 0.1–1.0)
Monocytes Relative: 11.9 % (ref 3.0–12.0)
NEUTROS ABS: 3.2 10*3/uL (ref 1.4–7.7)
NEUTROS PCT: 56.5 % (ref 43.0–77.0)
PLATELETS: 251 10*3/uL (ref 150.0–400.0)
RBC: 5.03 Mil/uL (ref 4.22–5.81)
RDW: 16.1 % — ABNORMAL HIGH (ref 11.5–15.5)
WBC: 5.7 10*3/uL (ref 4.0–10.5)

## 2016-09-26 LAB — COMPREHENSIVE METABOLIC PANEL
ALBUMIN: 4.5 g/dL (ref 3.5–5.2)
ALK PHOS: 66 U/L (ref 39–117)
ALT: 15 U/L (ref 0–53)
AST: 17 U/L (ref 0–37)
BILIRUBIN TOTAL: 0.9 mg/dL (ref 0.2–1.2)
BUN: 15 mg/dL (ref 6–23)
CO2: 31 mEq/L (ref 19–32)
Calcium: 9.7 mg/dL (ref 8.4–10.5)
Chloride: 99 mEq/L (ref 96–112)
Creatinine, Ser: 0.62 mg/dL (ref 0.40–1.50)
GFR: 132.1 mL/min (ref >60.00)
GLUCOSE: 84 mg/dL (ref 70–99)
Potassium: 4.3 mEq/L (ref 3.5–5.1)
Sodium: 135 mEq/L (ref 135–145)
Total Protein: 6.8 g/dL (ref 6.0–8.3)

## 2016-09-26 LAB — TSH: TSH: 2.99 u[IU]/mL (ref 0.35–4.50)

## 2016-09-26 MED ORDER — PRAVASTATIN SODIUM 20 MG PO TABS: 20.0000 mg | ORAL_TABLET | Freq: Every day | ORAL | 3 refills | Status: DC

## 2016-09-26 MED ORDER — ALPRAZOLAM 0.25 MG PO TABS: 0.2500 mg | ORAL_TABLET | Freq: Every day | ORAL | 0 refills | Status: DC

## 2016-09-26 NOTE — Progress Notes (Signed)
Subjective:    Patient ID: Brian [name], male    DOB: December 06, 1932, 83 y.o.   MRN: [id number]  HPI 83 year old patient who is seen today for follow-up and for annual Medicare wellness visit. 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  He has atrial fibrillation.  Remains on chronic anticoagulation.  He has essential hypertension. Doing well.  Past Medical History:  Diagnosis Date  . Anemia   . Atrial flutter ([place])   . BPH (benign prostatic hypertrophy)   . Epistaxis   . Hemorrhage of gastrointestinal tract, unspecified   . Hypertension   . Osteoarthrosis, unspecified whether generalized or localized, unspecified site   . Personal history of venous thrombosis and embolism   . Rosacea   . Stroke ([place])   . TIA (transient ischemic attack)      Social History   Social History  . Marital status: Married    Spouse name: N/A  . Number of children: N/A  . Years of education: N/A   Occupational History  . Not on file.   Social History Main Topics  . Smoking status: Former Research scientist (life sciences)  . Smokeless tobacco: Never Used     Comment: quit 50 yr ago  . Alcohol use No  . Drug use: No  . Sexual activity: Not on file   Other Topics Concern  . Not on file   Social History Narrative  . No narrative on file    Past Surgical History:  Procedure Laterality Date  . APPENDECTOMY    . HERNIA REPAIR    . IR GENERIC HISTORICAL  05/04/2016   IR PERCUTANEOUS ART THROMBECTOMY/INFUSION INTRACRANIAL INC DIAG ANGIO 05/04/2016 [name], MD [facility]  . IR GENERIC HISTORICAL  05/04/2016   IR ANGIO VERTEBRAL SEL SUBCLAVIAN INNOMINATE UNI R MOD SED 05/04/2016 [name], MD [facility]  . IR GENERIC HISTORICAL  06/07/2016   IR RADIOLOGIST EVAL & MGMT 06/07/2016 [facility]  . JOINT REPLACEMENT    . MITRAL VALVE REPAIR    . mvp repair    . RADIOLOGY WITH ANESTHESIA N/A 05/04/2016   Procedure: RADIOLOGY WITH ANESTHESIA;   Surgeon: [name], MD;  Location: [facility];  Service: Radiology;  Laterality: N/A;  . TOTAL HIP ARTHROPLASTY      Family History  Problem Relation Age of Onset  . Rheumatic fever Mother   . Coronary artery disease Father     Allergies  Allergen Reactions  . Novocain [Procaine Hcl]     Irregular heart beat     . Penicillins     REACTION: itching    Current Outpatient Prescriptions on File Prior to Visit  Medication Sig Dispense Refill  . ALPRAZolam (XANAX) 0.25 MG tablet Take 1 tablet (0.25 mg total) by mouth at bedtime. 90 tablet 0  . apixaban (ELIQUIS) 2.5 MG TABS tablet Take 1 tablet (2.5 mg total) by mouth 2 (two) times daily. 60 tablet 1  . clindamycin (CLEOCIN) 150 MG capsule TAKE 2 CAPSULES BY MOUTH PRIOR TO DENTAL WORK AS DIRECTED 2 capsule 2  . doxycycline (VIBRA-TABS) 100 MG tablet Take 1 tablet (100 mg total) by mouth 2 (two) times daily. 180 tablet 3  . doxycycline (VIBRA-TABS) 100 MG tablet TAKE 1 TABLET BY MOUTH TWICE DAILY 180 tablet 0  . finasteride (PROSCAR) 5 MG tablet Take 1 tablet (5 mg total) by mouth daily. 90 tablet 3  . hydrocortisone-pramoxine (ANALPRAM-HC) 2.5-1 % rectal cream Place 1 application  rectally 3 (three) times daily. 30 g 0  . metoprolol succinate (TOPROL-XL) 25 MG 24 hr tablet TAKE 1 TABLET BY MOUTH EVERY MORNING AND 2 TABLETS BY MOUTH EVERY EVENING 270 tablet 3  . metoprolol succinate (TOPROL-XL) 25 MG 24 hr tablet TAKE 1 TABLET BY MOUTH EVERY MORNING AND 2 TABLETS BY MOUTH EVERY EVENING 270 tablet 0  . pantoprazole (PROTONIX) 40 MG tablet Take 1 tablet (40 mg total) by mouth daily. 90 tablet 3  . pantoprazole (PROTONIX) 40 MG tablet TAKE 1 TABLET BY MOUTH DAILY 90 tablet 3  . saw palmetto 160 MG capsule Take 1 capsule (160 mg total) by mouth 2 (two) times daily.    . sertraline (ZOLOFT) 25 MG tablet Take 1 tablet (25 mg total) by mouth daily. 30 tablet 5   No current facility-administered medications on file prior to visit.     BP  120/68 (BP Location: Right Arm, Patient Position: Sitting, Cuff Size: Normal)   Pulse 74   Temp 98 F (36.7 C) (Oral)   Ht 5\' 7"  (1.702 m)   Wt 144 lb 12.8 oz (65.7 kg)   SpO2 94%   BMI 22.68 kg/m   Medicare wellness visit  1. Risk factors, based on past  M,S,F history.  Patient has known coronary artery disease and atrial fibrillation.  Cardio vascular risk factors include essential hypertension  2.  Physical activities:receives physical therapy twice weekly.  Walks with a walker  3.  Depression/mood: history of the anxiety disorder.  Remains on sertraline 25 mg daily  4.  Hearing:mild deficits  5.  ADL's:requires assistance in all aspects of daily living  6.  Fall risk:moderate due to left hemiparesis.  Uses a walker  7.  Home safety:no problems identified  8.  Height weight, and visual acuity;height and weight stable no change in visual acuity  9.  Counseling:continue heart healthy diet and physical therapy  10. Lab orders based on risk factors:we'll check laboratory update  11. Referral :follow-up urology as scheduled  12. Care plan:statin therapy discussed  13. Cognitive assessment: alert and oriented with normal affect.  No cognitive dysfunction  14. Screening: Patient provided with a written and personalized 5-10 year screening schedule in the AVS.    15. Provider List Update: primary care urology, neurology     Review of Systems  Constitutional: Negative for appetite change, chills, fatigue and fever.  HENT: Negative for congestion, dental problem, ear pain, hearing loss, sore throat, tinnitus, trouble swallowing and voice change.   Eyes: Negative for pain, discharge and visual disturbance.  Respiratory: Negative for cough, chest tightness, wheezing and stridor.   Cardiovascular: Negative for chest pain, palpitations and leg swelling.  Gastrointestinal: Negative for abdominal distention, abdominal pain, blood in stool, constipation, diarrhea, nausea and  vomiting.  Endocrine: Positive for polyuria.  Genitourinary: Negative for difficulty urinating, discharge, flank pain, genital sores, hematuria and urgency.  Musculoskeletal: Positive for gait problem. Negative for arthralgias, back pain, joint swelling, myalgias and neck stiffness.  Skin: Negative for rash.  Neurological: Positive for weakness. Negative for dizziness, syncope, speech difficulty, numbness and headaches.  Hematological: Negative for adenopathy. Does not bruise/bleed easily.  Psychiatric/Behavioral: Negative for behavioral problems and dysphoric mood. The patient is not nervous/anxious.        Objective:   Physical Exam  Constitutional: He appears well-developed and well-nourished.  Elderly, frail, alert Blood pressure 120/70  HENT:  Head: Normocephalic and atraumatic.  Right Ear: External ear normal.  Left Ear: External ear normal.  Nose: Nose normal.  Mouth/Throat: Oropharynx is clear and moist.  Eyes: Conjunctivae and EOM are normal. Pupils are equal, round, and reactive to light. No scleral icterus.  Neck: Normal range of motion. Neck supple. No JVD present. No thyromegaly present.  Cardiovascular: Normal heart sounds and intact distal pulses.  Exam reveals no gallop and no friction rub.   No murmur heard. Irregular with controlled ventricular response  Pulmonary/Chest: Effort normal and breath sounds normal. He exhibits no tenderness.  Abdominal: Soft. Bowel sounds are normal. He exhibits no distension and no mass. There is no tenderness.  Incisional hernia left midabdominal wall  Genitourinary: Penis normal.  Musculoskeletal: Normal range of motion. He exhibits no edema or tenderness.  Lymphadenopathy:    He has no cervical adenopathy.  Neurological: He is alert. He has normal reflexes. No cranial nerve deficit. Coordination normal.  Left-sided weakness  Skin: Skin is warm and dry. No rash noted.  Psychiatric: He has a normal mood and affect. His behavior is  normal.          Assessment & Plan:   Cerebrovascular disease.  Status post right MCA stroke with residual left-sided weakness Essential hypertension, stable Atrial fibrillation.  Continue anticoagulation History of coronary artery disease.  Will place on low intensity statin  Check laboratory update Follow-up 3 months  [id number],PETER Pilar Plate

## 2016-09-26 NOTE — Patient Instructions (Signed)
Limit your sodium (Salt) intake  Continue physical therapy  Urology follow-up as scheduled  Return in 3 months for follow-up

## 2016-09-28 ENCOUNTER — Encounter: Payer: Medicare Other | Admitting: Occupational Therapy

## 2016-09-28 ENCOUNTER — Ambulatory Visit: Payer: Medicare Other

## 2016-09-28 DIAGNOSIS — I69354 Hemiplegia and hemiparesis following cerebral infarction affecting left non-dominant side: Secondary | ICD-10-CM | POA: Diagnosis not present

## 2016-09-28 DIAGNOSIS — R278 Other lack of coordination: Secondary | ICD-10-CM | POA: Diagnosis not present

## 2016-09-28 DIAGNOSIS — R2681 Unsteadiness on feet: Secondary | ICD-10-CM | POA: Diagnosis not present

## 2016-09-28 DIAGNOSIS — M6281 Muscle weakness (generalized): Secondary | ICD-10-CM

## 2016-09-28 DIAGNOSIS — R2689 Other abnormalities of gait and mobility: Secondary | ICD-10-CM | POA: Diagnosis not present

## 2016-09-28 DIAGNOSIS — R261 Paralytic gait: Secondary | ICD-10-CM | POA: Diagnosis not present

## 2016-09-28 NOTE — Therapy (Signed)
[facility] [address] [place], [place], [address] Phone: [phone]   Fax:  [phone]  Physical Therapy Treatment  Patient Details  Name: Brian [name] MRN: [id number] Date of Birth: 1932-10-31 Referring Provider: Antony Contras, MD  Encounter Date: 09/28/2016      PT End of Session - 09/28/16 1444    Visit Number 10   Number of Visits 17   Date for PT Re-Evaluation 10/13/16   Authorization Type Medicare G-Code & progress note 10th visit   PT Start Time 1402   PT Stop Time 1443   PT Time Calculation (min) 41 min      Past Medical History:  Diagnosis Date  . Anemia   . Atrial flutter ([place])   . BPH (benign prostatic hypertrophy)   . Epistaxis   . Hemorrhage of gastrointestinal tract, unspecified   . Hypertension   . Osteoarthrosis, unspecified whether generalized or localized, unspecified site   . Personal history of venous thrombosis and embolism   . Rosacea   . Stroke ([place])   . TIA (transient ischemic attack)     Past Surgical History:  Procedure Laterality Date  . APPENDECTOMY    . HERNIA REPAIR    . IR GENERIC HISTORICAL  05/04/2016   IR PERCUTANEOUS ART THROMBECTOMY/INFUSION INTRACRANIAL INC DIAG ANGIO 05/04/2016 [name], MD [facility]  . IR GENERIC HISTORICAL  05/04/2016   IR ANGIO VERTEBRAL SEL SUBCLAVIAN INNOMINATE UNI R MOD SED 05/04/2016 [name], MD [facility]  . IR GENERIC HISTORICAL  06/07/2016   IR RADIOLOGIST EVAL & MGMT 06/07/2016 [facility]  . JOINT REPLACEMENT    . MITRAL VALVE REPAIR    . mvp repair    . RADIOLOGY WITH ANESTHESIA N/A 05/04/2016   Procedure: RADIOLOGY WITH ANESTHESIA;  Surgeon: [name], MD;  Location: [facility];  Service: Radiology;  Laterality: N/A;  . TOTAL HIP ARTHROPLASTY      There were no vitals filed for this visit.      Subjective Assessment - 09/28/16 1417    Subjective Pt denied falls since last visit. Pt requested PT  to examine his L foot again, as pain is still present. Pt reported he did go to his PCP this week and MD does not think foot is infected. Pt reported his caregiver thought the foot cream, provided by podiatrist wasn't working.    Limitations Lifting;Standing;Walking;House hold activities   Patient Stated Goals To get balance & walking back to pre stroke level; used rollator    Currently in Pain? Yes   Pain Score --  5/10 R great toe, 6-7/10 second toe   Pain Location Foot   Pain Orientation Left   Pain Descriptors / Indicators Sore   Pain Type Acute pain   Pain Onset 1 to 4 weeks ago   Pain Frequency Constant   Aggravating Factors  walking/standing   Pain Relieving Factors staying off his feet                         [facility] Adult PT Treatment/Exercise - 09/28/16 1426      Exercises   Exercises Knee/Hip     Knee/Hip Exercises: Aerobic   Nustep Performed with UE's/LE's level 5x 10 minutes with goal >/= 30 steps per minute for strengthening and activity tolerance. cues to continue pace to >30 steps per minute.             Balance Exercises - 09/28/16 1425  Balance Exercises: Standing   Other Standing Exercises Pt performed forward reach with 1 hand support on RW: 5" and min guard and reached to the floor with one hand on RW and min guard: 3" from floor. Pt required frequent seated rest breaks 2/2 L foot pain.      Self Care:     PT Education - 09/28/16 1437    Education provided Yes   Education Details PT assessed pt's L foot, due to pt's c/o pain. PT educated pt on calling MD regarding medication cream questions and to let provider know pain is not subsiding. PT reviewed forward reach and reach to the floor results. PT educated caregiver to notify MD if foot has not improved with new shoes, next week.    Person(s) Educated Patient;Caregiver(s)   Methods Explanation   Comprehension Verbalized understanding          PT Short Term Goals - 09/19/16  1443      PT SHORT TERM GOAL #1   Title Patient demonstrates understanding of initial HEP with CNA cueing correctly.  (Addressed 09/19/16)   Time 4   Period Weeks   Status Not Met     PT SHORT TERM GOAL #2   Title Patient reaches 7" anteriorly and within 10" of floor with RW support with supervision. (Addressed 09/19/16)   Time 4   Period Weeks   Status Achieved     PT SHORT TERM GOAL #3   Title Patient ambulates 200' with RW with supervision. (Addressed 09/12/16)   Baseline 09/12/16: 100 feet was max distance pt was able to achieve with RW; requires min A at times   Status Not Met     PT SHORT TERM GOAL #4   Title Patient negotiates ramps & curbs with RW with minimal guard. (Performed on 09/05/16)   Baseline Required mod A   Time 4   Period Weeks   Status Not Met           PT Long Term Goals - 08/24/16 1556      PT LONG TERM GOAL #1   Title Patient & CNA demonstrate understanding of HEP. (Target Date: 10/13/2016)   Time 8   Period Weeks   Status On-going     PT LONG TERM GOAL #2   Title Patient able to balance in standing with RW support reaching 10" and manages clothes safely.  (Target Date: 10/13/2016)    Time 8   Period Weeks   Status On-going     PT LONG TERM GOAL #3   Title Patient ambulates 300' with RW with supervision for community mobility  (Target Date: 10/13/2016)   Time 8   Period Weeks   Status On-going     PT LONG TERM GOAL #4   Title Patient negotiates ramps & curbs with RW and stairs with 2 rails with supervision for community access.  (Target Date: 10/13/2016)   Time 8   Period Weeks   Status On-going     PT LONG TERM GOAL #5   Title Patient ambulates with RW around furniture 100' modified independent for household mobility.  (Target Date: 10/13/2016)   Time 8   Period Weeks   Status On-going               Plan - 09/28/16 1445    Clinical Impression Statement Pt limited by L foot pain again today. Pt did get new shoes, which are wider  and should improve L foot pain. PT assessed L  foot and redness noted at PIP of 1st and 2nd toes with TTP and toe flex/ext. No warmth noted. Skin sloughing still presenting on dorsum of foot between 1st and 2nd toes. No signs of infection. Pt demonstrated improved reach to floor with RW support but was limited during today's session and standing/amb. incr. L foot pain. Pt had to use w/c to traverse back to gym. Continue with POC.    Rehab Potential Good   PT Frequency 2x / week   PT Duration 8 weeks   PT Treatment/Interventions ADLs/Self Care Home Management;DME Instruction;Gait training;Stair training;Functional mobility training;Therapeutic activities;Therapeutic exercise;Balance training;Neuromuscular re-education;Patient/family education   PT Next Visit Plan continue to work on standing balance especially weight shifting forwards, laterally to L, gait with RW and L foot advancement   Consulted and Agree with Plan of Care Patient;Family member/caregiver   Family Member Consulted CNA      Patient will benefit from skilled therapeutic intervention in order to improve the following deficits and impairments:  Abnormal gait, Decreased activity tolerance, Decreased balance, Decreased coordination, Decreased knowledge of use of DME, Decreased mobility, Decreased range of motion, Decreased strength, Postural dysfunction, Pain  Visit Diagnosis: Unsteadiness on feet  Muscle weakness (generalized)       G-Codes - 10-12-16 1423    Functional Assessment Tool Used Patient reaches 5" anteriorly and within 3" of floor with RW support with min guard.    Functional Limitation Changing and maintaining body position   Changing and Maintaining Body Position Current Status [phone]) At least 60 percent but less than 80 percent impaired, limited or restricted   Changing and Maintaining Body Position Goal Status (F6213) At least 60 percent but less than 80 percent impaired, limited or restricted      Problem  List Patient Active Problem List   Diagnosis Date Noted  . Cerebrovascular accident (CVA) due to thrombosis of right middle cerebral artery ([place])   . Coronary artery disease involving native coronary artery of native heart without angina pectoris   . H/O mitral valve repair   . Paroxysmal atrial fibrillation ([facility])   . Dysarthria, post-stroke   . Dysphagia, post-stroke   . Cerebrovascular accident (CVA) due to thrombosis of precerebral artery ([place])   . Scoliosis (and kyphoscoliosis), idiopathic 01/26/2014  . ROTATOR CUFF SYNDROME, LEFT 03/07/2010  . OTH MALIG NEOPLASM SKIN OTH&UNSPEC PARTS FACE 12/06/2009  . ACTINIC KERATOSIS, HEAD 09/06/2009  . Osteoarthritis 02/20/2009  . MITRAL VALVE REPLACEMENT, HX OF 02/20/2009  . HIP REPLACEMENT, TOTAL, HX OF 02/20/2009  . APPENDECTOMY, HX OF 02/20/2009  . HERNIORRHAPHY, HX OF 02/20/2009  . EPISTAXIS, RECURRENT 11/19/2008  . UNS ADVRS EFF UNS RX MEDICINAL&BIOLOGICAL SBSTNC 06/12/2008  . OTHER AND UNSPECIFIED MITRAL VALVE DISEASES 03/12/2008  . ATRIAL FLUTTER 03/12/2008  . Essential hypertension 03/06/2007  . PEPTIC ULCER DISEASE 03/06/2007  . BENIGN PROSTATIC HYPERTROPHY 03/06/2007  . Coronary atherosclerosis 02/13/2007  . ROSACEA 02/13/2007  . DVT, HX OF 02/13/2007    Stephenson Cichy L 12-Oct-2016, 2:48 PM  [facility] [address] [place], [place], [address] Phone: [phone]   Fax:  [phone]  Name: Brian [name] MRN: [id number] Date of Birth: 1933-03-07  Physical Therapy Progress Note  Dates of Reporting Period: 08/15/17 to 10/12/2016  Objective Reports of Subjective Statement: Pt continues to be limited by L foot pain.  Objective Measurements: 5" forward reach and 3" reach down towards floor.  Goal Update:      PT Short Term Goals - 09/19/16 1443  PT SHORT TERM GOAL #1   Title Patient demonstrates understanding of initial HEP with CNA cueing correctly.   (Addressed 09/19/16)   Time 4   Period Weeks   Status Not Met     PT SHORT TERM GOAL #2   Title Patient reaches 7" anteriorly and within 10" of floor with RW support with supervision. (Addressed 09/19/16)   Time 4   Period Weeks   Status Achieved     PT SHORT TERM GOAL #3   Title Patient ambulates 200' with RW with supervision. (Addressed 09/12/16)   Baseline 09/12/16: 100 feet was max distance pt was able to achieve with RW; requires min A at times   Status Not Met     PT SHORT TERM GOAL #4   Title Patient negotiates ramps & curbs with RW with minimal guard. (Performed on 09/05/16)   Baseline Required mod A   Time 4   Period Weeks   Status Not Met         PT Long Term Goals - 08/24/16 1556      PT LONG TERM GOAL #1   Title Patient & CNA demonstrate understanding of HEP. (Target Date: 10/13/2016)   Time 8   Period Weeks   Status On-going     PT LONG TERM GOAL #2   Title Patient able to balance in standing with RW support reaching 10" and manages clothes safely.  (Target Date: 10/13/2016)    Time 8   Period Weeks   Status On-going     PT LONG TERM GOAL #3   Title Patient ambulates 300' with RW with supervision for community mobility  (Target Date: 10/13/2016)   Time 8   Period Weeks   Status On-going     PT LONG TERM GOAL #4   Title Patient negotiates ramps & curbs with RW and stairs with 2 rails with supervision for community access.  (Target Date: 10/13/2016)   Time 8   Period Weeks   Status On-going     PT LONG TERM GOAL #5   Title Patient ambulates with RW around furniture 100' modified independent for household mobility.  (Target Date: 10/13/2016)   Time 8   Period Weeks   Status On-going       Plan: Continue to focus on functional mobility, gait training, strengthening, and balance training.  Reason Skilled Services are Required: To improve safety during functional mobility.   Geoffry Paradise, PT,DPT 09/28/16 2:50 PM Phone: [phone] Fax:  [phone]

## 2016-10-02 ENCOUNTER — Encounter: Payer: Medicare Other | Admitting: Occupational Therapy

## 2016-10-02 ENCOUNTER — Ambulatory Visit: Payer: Medicare Other | Admitting: Physical Therapy

## 2016-10-02 ENCOUNTER — Encounter: Payer: Self-pay | Admitting: Physical Therapy

## 2016-10-02 VITALS — BP 127/75 | HR 71

## 2016-10-02 DIAGNOSIS — R2681 Unsteadiness on feet: Secondary | ICD-10-CM

## 2016-10-02 DIAGNOSIS — R261 Paralytic gait: Secondary | ICD-10-CM

## 2016-10-02 DIAGNOSIS — M6281 Muscle weakness (generalized): Secondary | ICD-10-CM

## 2016-10-02 DIAGNOSIS — R278 Other lack of coordination: Secondary | ICD-10-CM | POA: Diagnosis not present

## 2016-10-02 DIAGNOSIS — I69354 Hemiplegia and hemiparesis following cerebral infarction affecting left non-dominant side: Secondary | ICD-10-CM | POA: Diagnosis not present

## 2016-10-02 DIAGNOSIS — R2689 Other abnormalities of gait and mobility: Secondary | ICD-10-CM

## 2016-10-02 NOTE — Therapy (Signed)
[facility] [address] [place], [place], [address] Phone: [phone]   Fax:  [phone]  Physical Therapy Treatment  Patient Details  Name: Brian [name] MRN: [id number] Date of Birth: [birth year] Referring Provider: Antony Contras, MD  Encounter Date: 10/02/2016    Past Medical History:  Diagnosis Date  . Anemia   . Atrial flutter ([place])   . BPH (benign prostatic hypertrophy)   . Epistaxis   . Hemorrhage of gastrointestinal tract, unspecified   . Hypertension   . Osteoarthrosis, unspecified whether generalized or localized, unspecified site   . Personal history of venous thrombosis and embolism   . Rosacea   . Stroke ([place])   . TIA (transient ischemic attack)     Past Surgical History:  Procedure Laterality Date  . APPENDECTOMY    . HERNIA REPAIR    . IR GENERIC HISTORICAL  05/04/2016   IR PERCUTANEOUS ART THROMBECTOMY/INFUSION INTRACRANIAL INC DIAG ANGIO 05/04/2016 [name], MD [facility]  . IR GENERIC HISTORICAL  05/04/2016   IR ANGIO VERTEBRAL SEL SUBCLAVIAN INNOMINATE UNI R MOD SED 05/04/2016 [name], MD [facility]  . IR GENERIC HISTORICAL  06/07/2016   IR RADIOLOGIST EVAL & MGMT 06/07/2016 [facility]  . JOINT REPLACEMENT    . MITRAL VALVE REPAIR    . mvp repair    . RADIOLOGY WITH ANESTHESIA N/A 05/04/2016   Procedure: RADIOLOGY WITH ANESTHESIA;  Surgeon: [name], MD;  Location: [facility];  Service: Radiology;  Laterality: N/A;  . TOTAL HIP ARTHROPLASTY      Vitals:   10/02/16 1325  BP: 127/75  Pulse: 71        Subjective Assessment - 10/02/16 1325    Subjective He got new shoes that are 9EEEs. His foot is still hurting including the bottom.    Limitations Lifting;Standing;Walking;House hold activities   Patient Stated Goals To get balance & walking back to pre stroke level; used rollator    Currently in Pain? Yes   Pain Score 5    Pain Location Toe (Comment  which one)   Pain Orientation Left   Pain Descriptors / Indicators Sore   Pain Type Chronic pain   Pain Onset 1 to 4 weeks ago   Pain Frequency Constant   Aggravating Factors  walking / standing   Pain Relieving Factors staying off his feet     Self-Care: left foot pain. Bunion with toe extension. Redness on plantar & medial surface of great toe. No signs of infection.  New shoes appear to put less stress on toe.   NuStep:  Level 5 with BUEs and BLEs with verbal cues on full ROM & fluency.    Gait Training: Pt ambulated 150' & 200' with RW with constant verbal cues and tactile cues on step length for step thru pattern and weight shift to stance limb.                              PT Short Term Goals - 09/19/16 1443      PT SHORT TERM GOAL #1   Title Patient demonstrates understanding of initial HEP with CNA cueing correctly.  (Addressed 09/19/16)   Time 4   Period Weeks   Status Not Met     PT SHORT TERM GOAL #2   Title Patient reaches 7" anteriorly and within 10" of floor with RW support with supervision. (Addressed 09/19/16)   Time 4  Period Weeks   Status Achieved     PT SHORT TERM GOAL #3   Title Patient ambulates 200' with RW with supervision. (Addressed 09/12/16)   Baseline 09/12/16: 100 feet was max distance pt was able to achieve with RW; requires min A at times   Status Not Met     PT SHORT TERM GOAL #4   Title Patient negotiates ramps & curbs with RW with minimal guard. (Performed on 09/05/16)   Baseline Required mod A   Time 4   Period Weeks   Status Not Met           PT Long Term Goals - 08/24/16 1556      PT LONG TERM GOAL #1   Title Patient & CNA demonstrate understanding of HEP. (Target Date: 10/13/2016)   Time 8   Period Weeks   Status On-going     PT LONG TERM GOAL #2   Title Patient able to balance in standing with RW support reaching 10" and manages clothes safely.  (Target Date: 10/13/2016)    Time 8   Period Weeks    Status On-going     PT LONG TERM GOAL #3   Title Patient ambulates 300' with RW with supervision for community mobility  (Target Date: 10/13/2016)   Time 8   Period Weeks   Status On-going     PT LONG TERM GOAL #4   Title Patient negotiates ramps & curbs with RW and stairs with 2 rails with supervision for community access.  (Target Date: 10/13/2016)   Time 8   Period Weeks   Status On-going     PT LONG TERM GOAL #5   Title Patient ambulates with RW around furniture 100' modified independent for household mobility.  (Target Date: 10/13/2016)   Time 8   Period Weeks   Status On-going             Patient will benefit from skilled therapeutic intervention in order to improve the following deficits and impairments:     Visit Diagnosis: Unsteadiness on feet  Muscle weakness (generalized)  Other abnormalities of gait and mobility  Paralytic gait     Problem List Patient Active Problem List   Diagnosis Date Noted  . Cerebrovascular accident (CVA) due to thrombosis of right middle cerebral artery ([place])   . Coronary artery disease involving native coronary artery of native heart without angina pectoris   . H/O mitral valve repair   . Paroxysmal atrial fibrillation ([facility])   . Dysarthria, post-stroke   . Dysphagia, post-stroke   . Cerebrovascular accident (CVA) due to thrombosis of precerebral artery ([place])   . Scoliosis (and kyphoscoliosis), idiopathic 01/26/2014  . ROTATOR CUFF SYNDROME, LEFT 03/07/2010  . OTH MALIG NEOPLASM SKIN OTH&UNSPEC PARTS FACE 12/06/2009  . ACTINIC KERATOSIS, HEAD 09/06/2009  . Osteoarthritis 02/20/2009  . MITRAL VALVE REPLACEMENT, HX OF 02/20/2009  . HIP REPLACEMENT, TOTAL, HX OF 02/20/2009  . APPENDECTOMY, HX OF 02/20/2009  . HERNIORRHAPHY, HX OF 02/20/2009  . EPISTAXIS, RECURRENT 11/19/2008  . UNS ADVRS EFF UNS RX MEDICINAL&BIOLOGICAL SBSTNC 06/12/2008  . OTHER AND UNSPECIFIED MITRAL VALVE DISEASES 03/12/2008  . ATRIAL FLUTTER 03/12/2008   . Essential hypertension 03/06/2007  . PEPTIC ULCER DISEASE 03/06/2007  . BENIGN PROSTATIC HYPERTROPHY 03/06/2007  . Coronary atherosclerosis 02/13/2007  . ROSACEA 02/13/2007  . DVT, HX OF 02/13/2007    Jamey Reas PT, DPT 10/02/2016, 1:41 PM  [facility] [address] [place], [place], [address] Phone:  [phone]   Fax:  [phone]  Name: Brian [name] MRN: [id number] Date of Birth: 11/01/1932

## 2016-10-03 ENCOUNTER — Encounter: Payer: Self-pay | Admitting: Cardiovascular Disease

## 2016-10-03 ENCOUNTER — Ambulatory Visit (INDEPENDENT_AMBULATORY_CARE_PROVIDER_SITE_OTHER): Payer: Medicare Other | Admitting: Cardiovascular Disease

## 2016-10-03 VITALS — BP 108/58 | HR 53 | Ht 67.0 in | Wt 148.0 lb

## 2016-10-03 DIAGNOSIS — I10 Essential (primary) hypertension: Secondary | ICD-10-CM

## 2016-10-03 DIAGNOSIS — I119 Hypertensive heart disease without heart failure: Secondary | ICD-10-CM | POA: Diagnosis not present

## 2016-10-03 DIAGNOSIS — I480 Paroxysmal atrial fibrillation: Secondary | ICD-10-CM | POA: Diagnosis not present

## 2016-10-03 DIAGNOSIS — Z9889 Other specified postprocedural states: Secondary | ICD-10-CM

## 2016-10-03 DIAGNOSIS — E7800 Pure hypercholesterolemia, unspecified: Secondary | ICD-10-CM

## 2016-10-03 DIAGNOSIS — I6300 Cerebral infarction due to thrombosis of unspecified precerebral artery: Secondary | ICD-10-CM | POA: Diagnosis not present

## 2016-10-03 NOTE — Progress Notes (Signed)
Cardiology Office Note   Date:  10/03/2016   ID:  Brian [name], DOB [birth year], MRN [id number]  PCP:  Nyoka Cowden, MD  Cardiologist:   Skeet Latch, MD   Chief Complaint  Patient presents with  . Follow-up    Pt states no Sx.   . Foot Pain    toe will not go down after stroke, with pain.      History of Present Illness: Brian [name] is a 83 y.o. male with paroxysmal atrial fibrillation, GI bleeds, prior stroke, hypertension, prior mitral vale repair who presents for follow up.  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  He underwent embolectomy. INR was 2.8 on admission.  He was switched to Eliquis 2.5 mg bid prior to discharge.  Brian [name] had a gastric ulcer in 2002 and required transfusion at that time. This was subsequently treated and he has not had any recurrent GI bleeding.  Brian [name] had his mitral valve repaired at [facility] in 2002. [facility] He had an echo 05/06/16 that revealed LVEF 55-60% and mildly elevated pulmonary pressures.  He had a [employer] 01/2004 that revealed LVEF 64% and a nonreversible inferior defect that was thought to be diaphragm attenuation versus infarct. He had an echo 10/2009 with normal systolic function, mild aortic regurgitation and mild mitral regurgitation.  He was last seen by Dr. Stanford Breed 01/2013 at which time he was felt to be in atrial flutter. He was well rate-controlled and was asymptomatic.   Since his last appointment Brian [name] has been well.  He continues to do rehab twice per week and feels well with this.  He denies chest pain or shortness of breath.  He also denies lower extremity edema, orthopnea or PND.  He also denies palpitations, lightheadedness or dizziness.  He notes that his diet is poor.  He has been eating a lot of bread and sweets.     Past Medical History:  Diagnosis Date  . Anemia   . Atrial flutter  ([place])   . BPH (benign prostatic hypertrophy)   . Epistaxis   . Hemorrhage of gastrointestinal tract, unspecified   . Hypertension   . Osteoarthrosis, unspecified whether generalized or localized, unspecified site   . Personal history of venous thrombosis and embolism   . Rosacea   . Stroke ([place])   . TIA (transient ischemic attack)     Past Surgical History:  Procedure Laterality Date  . APPENDECTOMY    . HERNIA REPAIR    . IR GENERIC HISTORICAL  05/04/2016   IR PERCUTANEOUS ART THROMBECTOMY/INFUSION INTRACRANIAL INC DIAG ANGIO 05/04/2016 [name], MD [facility]  . IR GENERIC HISTORICAL  05/04/2016   IR ANGIO VERTEBRAL SEL SUBCLAVIAN INNOMINATE UNI R MOD SED 05/04/2016 [name], MD [facility]  . IR GENERIC HISTORICAL  06/07/2016   IR RADIOLOGIST EVAL & MGMT 06/07/2016 [facility]  . JOINT REPLACEMENT    . MITRAL VALVE REPAIR    . mvp repair    . RADIOLOGY WITH ANESTHESIA N/A 05/04/2016   Procedure: RADIOLOGY WITH ANESTHESIA;  Surgeon: [name], MD;  Location: [facility];  Service: Radiology;  Laterality: N/A;  . TOTAL HIP ARTHROPLASTY       Current Outpatient Prescriptions  Medication Sig Dispense Refill  . ALPRAZolam (XANAX) 0.25 MG tablet Take 1 tablet (0.25 mg total) by mouth at bedtime. 90 tablet 0  . apixaban (  ELIQUIS) 2.5 MG TABS tablet Take 1 tablet (2.5 mg total) by mouth 2 (two) times daily. 60 tablet 1  . clindamycin (CLEOCIN) 150 MG capsule TAKE 2 CAPSULES BY MOUTH PRIOR TO DENTAL WORK AS DIRECTED 2 capsule 2  . finasteride (PROSCAR) 5 MG tablet Take 1 tablet (5 mg total) by mouth daily. 90 tablet 3  . hydrocortisone-pramoxine (ANALPRAM-HC) 2.5-1 % rectal cream Place 1 application rectally 3 (three) times daily. 30 g 0  . metoprolol succinate (TOPROL-XL) 25 MG 24 hr tablet TAKE 1 TABLET BY MOUTH EVERY MORNING AND 2 TABLETS BY MOUTH EVERY EVENING 270 tablet 0  . pantoprazole (PROTONIX) 40 MG tablet TAKE 1 TABLET BY MOUTH DAILY 90 tablet 3  .  pravastatin (PRAVACHOL) 20 MG tablet Take 1 tablet (20 mg total) by mouth daily. 90 tablet 3  . saw palmetto 160 MG capsule Take 1 capsule (160 mg total) by mouth 2 (two) times daily.    . sertraline (ZOLOFT) 25 MG tablet Take 1 tablet (25 mg total) by mouth daily. 30 tablet 5   No current facility-administered medications for this visit.     Allergies:   Novocain [procaine hcl] and Penicillins    Social History:  The patient  reports that he has quit smoking. He has never used smokeless tobacco. He reports that he does not drink alcohol or use drugs.   Family History:  The patient's family history includes Coronary artery disease in his father; Rheumatic fever in his mother.    ROS:  Please see the history of present illness.   Otherwise, review of systems are positive for none.   All other systems are reviewed and negative.    PHYSICAL EXAM: VS:  BP (!) 108/58   Pulse (!) 53   Ht 5\' 7"  (1.702 m)   Wt 67.1 kg (148 lb)   BMI 23.18 kg/m  , BMI Body mass index is 23.18 kg/m. GENERAL:  Well appearing HEENT:  Pupils equal round and reactive, fundi not visualized, oral mucosa unremarkable NECK:  No jugular venous distention, waveform within normal limits, carotid upstroke brisk and symmetric, no bruits, no thyromegaly LYMPHATICS:  No cervical adenopathy LUNGS:  Clear to auscultation bilaterally HEART:  Irregularly irregular.  PMI not displaced or sustained,S1 and S2 within normal limits, no S3, no S4, no clicks, no rubs, III/VI holosystolic murmur at the apex ABD:  Flat, positive bowel sounds normal in frequency in pitch, no bruits, no rebound, no guarding, no midline pulsatile mass, no hepatomegaly, no splenomegaly EXT:  2 plus pulses throughout, no edema, no cyanosis no clubbing SKIN:  No rashes no nodules NEURO:  Cranial nerves II through XII grossly intact, motor grossly intact throughout PSYCH:  Cognitively intact, oriented to person place and time   EKG:  EKG is not ordered  today. 07/04/16:  Atrial fibrillation. Rate 76 bpm. Left anterior fascicular block. Left ventricular hypertrophy with repolarization in about these.  Echo 05/06/16: Study Conclusions  - Left ventricle: The cavity size was normal. Wall thickness was   normal. Systolic function was normal. The estimated ejection   fraction was in the range of 55% to 60%. Wall motion was normal;   there were no regional wall motion abnormalities. Mitral   annuloplasty precludes evaluation of LV diastolic function. - Ventricular septum: Septal motion showed paradox. These changes   are consistent with a post-thoracotomy state. - Mitral valve: Prior procedures included surgical repair. An   annular ring prosthesis was present and functioning normally.  Valve area by pressure half-time: 2.47 cm^2. - Left atrium: The atrium was mildly dilated. - Pulmonary arteries: Systolic pressure was mildly increased. PA   peak pressure: 44 mm Hg (S).  Recent Labs: 05/07/2016: Magnesium 1.8 09/26/2016: ALT 15; BUN 15; Creatinine, Ser 0.62; Hemoglobin 15.1; Platelets 251.0; Potassium 4.3; Sodium 135; TSH 2.99    Lipid Panel    Component Value Date/Time   CHOL 114 05/05/2016 0500   TRIG 54 05/05/2016 0500   HDL 41 05/05/2016 0500   CHOLHDL 2.8 05/05/2016 0500   VLDL 11 05/05/2016 0500   LDLCALC 62 05/05/2016 0500   LDLDIRECT 80.6 05/27/2009 0857      Wt Readings from Last 3 Encounters:  10/03/16 67.1 kg (148 lb)  09/26/16 65.7 kg (144 lb 12.8 oz)  07/25/16 65.6 kg (144 lb 9.6 oz)      ASSESSMENT AND PLAN:  # Persistent atrial fibrillation:  # Prior embolic stroke/hemorrhagic: Rates are well controlled on metoprolol and he is asymptomatic. He was started on low-dose Eliquis, presumably because there was intracerebral hemorrhage with his stroke. He has underdosed for his weight and renal function. Given that he had a stroke while his INR was therapeutic on Coumadin, this puts him at high risk for recurrent  embolic stroke. This is a challenging situation, as valvular atrial fibrillation should not be treated with NOACs.  He is on this only because of his embolic stroke with a therapeutic INR.  He does have a history of GI bleed, but none recently. This patients CHA2DS2-VASc Score and unadjusted Ischemic Stroke Rate (% per year) is equal to 7.2 % stroke rate/year from a score of 5 Above score calculated as 1 point each if present [CHF, HTN, DM, Vascular=MI/PAD/Aortic Plaque, Age if 65-74, or Male] Above score calculated as 2 points each if present [Age > 75, or Stroke/TIA/TE]  # Hypertension: Blood pressure is well-controlled on metoprolol.  # Mitral valve repair: Valve stable on echo 04/2016.  # Hyperlipidemia: Check lipids and CMP.  Continue pravastatin.   Current medicines are reviewed at length with the patient today.  The patient does not have concerns regarding medicines.  The following changes have been made:  no change  Labs/ tests ordered today include:   No orders of the defined types were placed in this encounter.  Time spent: 45 minutes-Greater than 50% of this time was spent in counseling, explanation of diagnosis, planning of further management, and coordination of care.   Disposition:   FU with Sian Rockers C. Oval Linsey, MD, [facility] in 3 months    This note was written with the assistance of speech recognition software.  Please excuse any transcriptional errors.  Signed, Rosamund Nyland C. Oval Linsey, MD, [facility]  10/03/2016 2:52 PM    [facility]

## 2016-10-03 NOTE — Patient Instructions (Signed)
Medication Instructions:  Your physician recommends that you continue on your current medications as directed. Please refer to the Current Medication list given to you today.  Labwork: Fasting LP/CMET AT [facility] ON THE FIRST FLOOR NEXT WEEK  Testing/Procedures: NONE.  Follow-Up: Your physician recommends that you schedule a follow-up appointment in: [place]   If you need a refill on your cardiac medications before your next appointment, please call your pharmacy.

## 2016-10-05 ENCOUNTER — Ambulatory Visit: Payer: Medicare Other | Admitting: Physical Therapy

## 2016-10-05 ENCOUNTER — Encounter: Payer: Medicare Other | Admitting: Occupational Therapy

## 2016-10-05 DIAGNOSIS — I10 Essential (primary) hypertension: Secondary | ICD-10-CM | POA: Diagnosis not present

## 2016-10-05 LAB — LIPID PANEL
CHOLESTEROL: 169 mg/dL (ref <200)
HDL: 49 mg/dL (ref >40)
LDL CALC: 98 mg/dL (ref <100)
TRIGLYCERIDES: 110 mg/dL (ref <150)
Total CHOL/HDL Ratio: 3.4 Ratio (ref <5.0)
VLDL: 22 mg/dL (ref <30)

## 2016-10-05 LAB — COMPREHENSIVE METABOLIC PANEL
ALBUMIN: 4 g/dL (ref 3.6–5.1)
ALT: 13 U/L (ref 9–46)
AST: 19 U/L (ref 10–35)
Alkaline Phosphatase: 61 U/L (ref 40–115)
BILIRUBIN TOTAL: 0.9 mg/dL (ref 0.2–1.2)
BUN: 13 mg/dL (ref 7–25)
CO2: 27 mmol/L (ref 20–31)
CREATININE: 0.64 mg/dL — AB (ref 0.70–1.11)
Calcium: 9.4 mg/dL (ref 8.6–10.3)
Chloride: 100 mmol/L (ref 98–110)
Glucose, Bld: 85 mg/dL (ref 65–99)
Potassium: 4.3 mmol/L (ref 3.5–5.3)
SODIUM: 136 mmol/L (ref 135–146)
TOTAL PROTEIN: 6.3 g/dL (ref 6.1–8.1)

## 2016-10-06 ENCOUNTER — Encounter: Payer: Self-pay | Admitting: Physical Therapy

## 2016-10-06 ENCOUNTER — Other Ambulatory Visit: Payer: Self-pay | Admitting: Internal Medicine

## 2016-10-06 ENCOUNTER — Ambulatory Visit: Payer: Medicare Other | Admitting: Physical Therapy

## 2016-10-06 DIAGNOSIS — M6281 Muscle weakness (generalized): Secondary | ICD-10-CM

## 2016-10-06 DIAGNOSIS — R2681 Unsteadiness on feet: Secondary | ICD-10-CM | POA: Diagnosis not present

## 2016-10-06 DIAGNOSIS — I69354 Hemiplegia and hemiparesis following cerebral infarction affecting left non-dominant side: Secondary | ICD-10-CM

## 2016-10-06 DIAGNOSIS — R261 Paralytic gait: Secondary | ICD-10-CM

## 2016-10-06 DIAGNOSIS — R2689 Other abnormalities of gait and mobility: Secondary | ICD-10-CM

## 2016-10-06 DIAGNOSIS — R278 Other lack of coordination: Secondary | ICD-10-CM | POA: Diagnosis not present

## 2016-10-06 NOTE — Therapy (Signed)
[facility] [address] [place], [place], [address] Phone: [phone]   Fax:  [phone]  Physical Therapy Treatment  Patient Details  Name: Brian [name] MRN: [id number] Date of Birth: [birth year] Referring Provider: Antony Contras, MD  Encounter Date: 10/06/2016      PT End of Session - 10/06/16 1337    Visit Number 12   Number of Visits 17   Date for PT Re-Evaluation 10/13/16   Authorization Type Medicare G-Code & progress note 10th visit   PT Start Time 1318   PT Stop Time 1400   PT Time Calculation (min) 42 min   Activity Tolerance Patient limited by fatigue;Patient tolerated treatment well   Behavior During Therapy [facility] for tasks assessed/performed      Past Medical History:  Diagnosis Date  . Anemia   . Atrial flutter ([place])   . BPH (benign prostatic hypertrophy)   . Epistaxis   . Hemorrhage of gastrointestinal tract, unspecified   . Hypertension   . Osteoarthrosis, unspecified whether generalized or localized, unspecified site   . Personal history of venous thrombosis and embolism   . Rosacea   . Stroke ([place])   . TIA (transient ischemic attack)     Past Surgical History:  Procedure Laterality Date  . APPENDECTOMY    . HERNIA REPAIR    . IR GENERIC HISTORICAL  05/04/2016   IR PERCUTANEOUS ART THROMBECTOMY/INFUSION INTRACRANIAL INC DIAG ANGIO 05/04/2016 [name], MD [facility]  . IR GENERIC HISTORICAL  05/04/2016   IR ANGIO VERTEBRAL SEL SUBCLAVIAN INNOMINATE UNI R MOD SED 05/04/2016 [name], MD [facility]  . IR GENERIC HISTORICAL  06/07/2016   IR RADIOLOGIST EVAL & MGMT 06/07/2016 [facility]  . JOINT REPLACEMENT    . MITRAL VALVE REPAIR    . mvp repair    . RADIOLOGY WITH ANESTHESIA N/A 05/04/2016   Procedure: RADIOLOGY WITH ANESTHESIA;  Surgeon: [name], MD;  Location: [facility];  Service: Radiology;  Laterality: N/A;  . TOTAL HIP ARTHROPLASTY      There were  no vitals filed for this visit.      Subjective Assessment - 10/06/16 1325    Subjective No new complaints. No falls to report. Still having issues with foot, especially the toe.    Patient is accompained by: Family member  daughter   Limitations Lifting;Standing;Walking;House hold activities   Patient Stated Goals To get balance & walking back to pre stroke level; used rollator    Currently in Pain? Yes   Pain Score 6    Pain Location Toe (Comment which one)  big toe   Pain Orientation Left   Pain Descriptors / Indicators Sore   Pain Type Chronic pain   Pain Onset 1 to 4 weeks ago   Pain Frequency Constant   Aggravating Factors  walking/standing   Pain Relieving Factors staying off his feet           [facility] Adult PT Treatment/Exercise - 10/06/16 1337      Transfers   Transfers Sit to Stand;Stand to Sit   Sit to Stand 5: Supervision;With upper extremity assist;With armrests;From chair/3-in-1   Sit to Stand Details Visual cues/gestures for sequencing;Verbal cues for safe use of DME/AE   Sit to Stand Details (indicate cue type and reason) cues to scoot forward with standing for ease in transition of movements   Stand to Sit 5: Supervision;With upper extremity assist;With armrests;To chair/3-in-1   Stand to Sit Details (indicate cue type  and reason) Verbal cues for precautions/safety;Verbal cues for safe use of DME/AE;Verbal cues for sequencing   Stand to Sit Details cues to turn all the way to surface with both legs touching surface before sitting down and to keep walker close, not walk away from it     Ambulation/Gait   Ambulation/Gait Yes   Ambulation/Gait Assistance 5: Supervision   Ambulation/Gait Assistance Details moderate cues on posture, walker position with gait and for increased bil step length left>right   Ambulation Distance (Feet) 100 Feet  x1, 80 x2   Assistive device Rolling walker     Knee/Hip Exercises: Aerobic   Nustep Performed with UE's/LE's level 5x 10  minutes with goal >/= 30 steps per minute for strengthening and activity tolerance. cues to continue pace to >30 steps per minute.     Knee/Hip Exercises: Seated   Long Arc Quad AROM;Strengthening;Both;1 set;10 reps   [employer] Limitations cues to slow down for increased muscle activation   Other Seated Knee/Hip Exercises seated heel/toe raises x 10 reps    Marching Limitations alternating marching x 10 reps each leg with cues for slow, controlled movements             PT Short Term Goals - 09/19/16 1443      PT SHORT TERM GOAL #1   Title Patient demonstrates understanding of initial HEP with CNA cueing correctly.  (Addressed 09/19/16)   Time 4   Period Weeks   Status Not Met     PT SHORT TERM GOAL #2   Title Patient reaches 7" anteriorly and within 10" of floor with RW support with supervision. (Addressed 09/19/16)   Time 4   Period Weeks   Status Achieved     PT SHORT TERM GOAL #3   Title Patient ambulates 200' with RW with supervision. (Addressed 09/12/16)   Baseline 09/12/16: 100 feet was max distance pt was able to achieve with RW; requires min A at times   Status Not Met     PT SHORT TERM GOAL #4   Title Patient negotiates ramps & curbs with RW with minimal guard. (Performed on 09/05/16)   Baseline Required mod A   Time 4   Period Weeks   Status Not Met           PT Long Term Goals - 10/02/16 1753      PT LONG TERM GOAL #1   Title Patient & CNA demonstrate understanding of HEP. (Target Date: 10/13/2016)   Time 8   Period Weeks   Status On-going     PT LONG TERM GOAL #2   Title Patient able to balance in standing with RW support reaching 10" and manages clothes safely.  (Target Date: 10/13/2016)    Time 8   Period Weeks   Status On-going     PT LONG TERM GOAL #3   Title Patient ambulates 200' with RW with supervision for community mobility  (Target Date: 10/13/2016)   Time 8   Period Weeks   Status Revised     PT LONG TERM GOAL #4   Title Patient  negotiates ramps & curbs with RW and stairs with 2 rails with minimal assist for community access.  (Target Date: 10/13/2016)   Time 8   Period Weeks   Status Revised     PT LONG TERM GOAL #5   Title Patient ambulates with RW around furniture 100' modified independent for household mobility.  (Target Date: 10/13/2016)   Baseline 10/02/2016 PT  canceled / deferred LTG as no longer appears safe goal for this patient.    Time 8   Period Weeks   Status Deferred            Plan - 10/06/16 1337    Clinical Impression Statement today's skilled session continued to address gait training, transfer training and LE strengthening. Pt still with toe pain limiting gait and standing. Cues needed to redirect to tasks being performed through out session. Pt's daughter present for first portion of session: she plans to get him an appt to have toe looked out for pain reducement and discussed with her eventual membership to [facility] for continued use of Nustep and other appropriate weight machines. Pt is making slow, steady progress toward goals and should benefit from continued PT to progress toward unmet goals.                      Rehab Potential Good   PT Frequency 2x / week   PT Duration 8 weeks   PT Treatment/Interventions ADLs/Self Care Home Management;DME Instruction;Gait training;Stair training;Functional mobility training;Therapeutic activities;Therapeutic exercise;Balance training;Neuromuscular re-education;Patient/family education   PT Next Visit Plan continue to work on standing balance especially weight shifting forwards, laterally to L, gait with RW and L foot advancement   Consulted and Agree with Plan of Care Patient;Family member/caregiver   Family Member Consulted CNA      Patient will benefit from skilled therapeutic intervention in order to improve the following deficits and impairments:  Abnormal gait, Decreased activity tolerance, Decreased balance, Decreased coordination, Decreased  knowledge of use of DME, Decreased mobility, Decreased range of motion, Decreased strength, Postural dysfunction, Pain  Visit Diagnosis: Unsteadiness on feet  Muscle weakness (generalized)  Other abnormalities of gait and mobility  Paralytic gait  Hemiplegia and hemiparesis following cerebral infarction affecting left non-dominant side (HCC)  Other lack of coordination     Problem List Patient Active Problem List   Diagnosis Date Noted  . Cerebrovascular accident (CVA) due to thrombosis of right middle cerebral artery ([place])   . Coronary artery disease involving native coronary artery of native heart without angina pectoris   . H/O mitral valve repair   . Paroxysmal atrial fibrillation (HCC)   . Dysarthria, post-stroke   . Dysphagia, post-stroke   . Cerebrovascular accident (CVA) due to thrombosis of precerebral artery ([place])   . Scoliosis (and kyphoscoliosis), idiopathic 01/26/2014  . ROTATOR CUFF SYNDROME, LEFT 03/07/2010  . OTH MALIG NEOPLASM SKIN OTH&UNSPEC PARTS FACE 12/06/2009  . ACTINIC KERATOSIS, HEAD 09/06/2009  . Osteoarthritis 02/20/2009  . MITRAL VALVE REPLACEMENT, HX OF 02/20/2009  . HIP REPLACEMENT, TOTAL, HX OF 02/20/2009  . APPENDECTOMY, HX OF 02/20/2009  . HERNIORRHAPHY, HX OF 02/20/2009  . EPISTAXIS, RECURRENT 11/19/2008  . UNS ADVRS EFF UNS RX MEDICINAL&BIOLOGICAL SBSTNC 06/12/2008  . OTHER AND UNSPECIFIED MITRAL VALVE DISEASES 03/12/2008  . ATRIAL FLUTTER 03/12/2008  . Essential hypertension 03/06/2007  . PEPTIC ULCER DISEASE 03/06/2007  . BENIGN PROSTATIC HYPERTROPHY 03/06/2007  . Coronary atherosclerosis 02/13/2007  . ROSACEA 02/13/2007  . DVT, HX OF 02/13/2007    Willow Ora, PTA, [facility] [address], [place], [place] [address] [phone] 10/06/16, 3:06 PM   Name: KHARI MALLY MRN: [id number] Date of Birth: [birth year]

## 2016-10-09 ENCOUNTER — Telehealth: Payer: Self-pay | Admitting: Cardiovascular Disease

## 2016-10-09 ENCOUNTER — Telehealth: Payer: Self-pay | Admitting: Internal Medicine

## 2016-10-09 DIAGNOSIS — N3941 Urge incontinence: Secondary | ICD-10-CM | POA: Diagnosis not present

## 2016-10-09 DIAGNOSIS — R351 Nocturia: Secondary | ICD-10-CM | POA: Diagnosis not present

## 2016-10-09 DIAGNOSIS — R3914 Feeling of incomplete bladder emptying: Secondary | ICD-10-CM | POA: Diagnosis not present

## 2016-10-09 DIAGNOSIS — N401 Enlarged prostate with lower urinary tract symptoms: Secondary | ICD-10-CM | POA: Diagnosis not present

## 2016-10-09 NOTE — Telephone Encounter (Signed)
Okay for early refill of Xanax

## 2016-10-09 NOTE — Telephone Encounter (Signed)
No problem with his current med list to take Myrbetriq

## 2016-10-09 NOTE — Telephone Encounter (Signed)
Returned call to patient, communicated lab results to him, he verbalized understanding and thanks.  Also wanted to know if any concerns for newly prescribed medication Myrbetriq to interfere w current meds or affect HR/BP. Pt aware I will follow up w him once this is reviewed by pharmD

## 2016-10-09 NOTE — Telephone Encounter (Signed)
See message below, please advise.

## 2016-10-09 NOTE — Telephone Encounter (Signed)
Patient advised and voiced thanks for call.

## 2016-10-09 NOTE — Telephone Encounter (Signed)
New message ° ° ° ° ° ° ° ° ° °Pt calling about lab results °

## 2016-10-09 NOTE — Telephone Encounter (Signed)
Pts daughter calling to see if Dr. Raliegh Ip would call the pharmacy for a early refill on his Xanax

## 2016-10-10 ENCOUNTER — Ambulatory Visit: Payer: Medicare Other | Admitting: Physical Therapy

## 2016-10-10 ENCOUNTER — Other Ambulatory Visit: Payer: Self-pay | Admitting: Internal Medicine

## 2016-10-10 ENCOUNTER — Encounter: Payer: Medicare Other | Admitting: Occupational Therapy

## 2016-10-10 ENCOUNTER — Encounter: Payer: Self-pay | Admitting: Physical Therapy

## 2016-10-10 DIAGNOSIS — R278 Other lack of coordination: Secondary | ICD-10-CM | POA: Diagnosis not present

## 2016-10-10 DIAGNOSIS — R2689 Other abnormalities of gait and mobility: Secondary | ICD-10-CM

## 2016-10-10 DIAGNOSIS — R2681 Unsteadiness on feet: Secondary | ICD-10-CM | POA: Diagnosis not present

## 2016-10-10 DIAGNOSIS — M6281 Muscle weakness (generalized): Secondary | ICD-10-CM | POA: Diagnosis not present

## 2016-10-10 DIAGNOSIS — R261 Paralytic gait: Secondary | ICD-10-CM

## 2016-10-10 DIAGNOSIS — I69354 Hemiplegia and hemiparesis following cerebral infarction affecting left non-dominant side: Secondary | ICD-10-CM | POA: Diagnosis not present

## 2016-10-10 MED ORDER — ALPRAZOLAM 0.25 MG PO TABS
0.2500 mg | ORAL_TABLET | Freq: Every day | ORAL | 0 refills | Status: DC
Start: 2016-10-10 — End: 2016-10-19

## 2016-10-10 NOTE — Therapy (Signed)
[facility] [address] [place], [place], [address] Phone: [phone]   Fax:  [phone]  Physical Therapy Treatment  Patient Details  Name: Brian [name] MRN: [id number] Date of Birth: September 02, 1932 Referring Provider: Antony Contras, MD  Encounter Date: 10/10/2016      PT End of Session - 10/10/16 2205    Visit Number 13   Number of Visits 17   Date for PT Re-Evaluation 10/13/16   Authorization Type Medicare G-Code & progress note 10th visit   PT Start Time 1400   PT Stop Time 1444   PT Time Calculation (min) 44 min   Activity Tolerance Patient limited by fatigue;Patient tolerated treatment well   Behavior During Therapy [facility] for tasks assessed/performed      Past Medical History:  Diagnosis Date  . Anemia   . Atrial flutter ([place])   . BPH (benign prostatic hypertrophy)   . Epistaxis   . Hemorrhage of gastrointestinal tract, unspecified   . Hypertension   . Osteoarthrosis, unspecified whether generalized or localized, unspecified site   . Personal history of venous thrombosis and embolism   . Rosacea   . Stroke ([place])   . TIA (transient ischemic attack)     Past Surgical History:  Procedure Laterality Date  . APPENDECTOMY    . HERNIA REPAIR    . IR GENERIC HISTORICAL  05/04/2016   IR PERCUTANEOUS ART THROMBECTOMY/INFUSION INTRACRANIAL INC DIAG ANGIO 05/04/2016 [name], MD [facility]  . IR GENERIC HISTORICAL  05/04/2016   IR ANGIO VERTEBRAL SEL SUBCLAVIAN INNOMINATE UNI R MOD SED 05/04/2016 [name], MD [facility]  . IR GENERIC HISTORICAL  06/07/2016   IR RADIOLOGIST EVAL & MGMT 06/07/2016 [facility]  . JOINT REPLACEMENT    . MITRAL VALVE REPAIR    . mvp repair    . RADIOLOGY WITH ANESTHESIA N/A 05/04/2016   Procedure: RADIOLOGY WITH ANESTHESIA;  Surgeon: [name], MD;  Location: [facility] [place];  Service: Radiology;  Laterality: N/A;  . TOTAL HIP ARTHROPLASTY      There were  no vitals filed for this visit.      Subjective Assessment - 10/10/16 1400    Subjective He reports no falls. He feels PT has helped a lot. He wants to get "back to work."      Limitations Lifting;Standing;Walking;House hold activities   Patient Stated Goals To get balance & walking back to pre stroke level; used rollator    Currently in Pain? Yes   Pain Score 6    Pain Location Toe (Comment which one)   Pain Orientation Left   Pain Descriptors / Indicators Sore   Pain Type Chronic pain   Pain Onset More than a month ago   Pain Frequency Constant   Aggravating Factors  walking, standing   Pain Relieving Factors staying off his feet     Therapeutic Exercise: NuStep level 5 with BUEs & BLEs 8 min.  PT instructed in need for ongoing HEP. Pt to perform HEP before next session with CNA and plans to review during next session.   Gait Training: Pt ambulated 100' & 200' with RW with supervision with verbal cues on LLE step length. Pt reaches 10" anteriorly with RW support. He donnes jacket in standing with supervision.  Pt negotiates ramp & curb with RW with minA. CNA observed & verbalized understanding.  PT Short Term Goals - 09/19/16 1443      PT SHORT TERM GOAL #1   Title Patient demonstrates understanding of initial HEP with CNA cueing correctly.  (Addressed 09/19/16)   Time 4   Period Weeks   Status Not Met     PT SHORT TERM GOAL #2   Title Patient reaches 7" anteriorly and within 10" of floor with RW support with supervision. (Addressed 09/19/16)   Time 4   Period Weeks   Status Achieved     PT SHORT TERM GOAL #3   Title Patient ambulates 200' with RW with supervision. (Addressed 09/12/16)   Baseline 09/12/16: 100 feet was max distance pt was able to achieve with RW; requires min A at times   Status Not Met     PT SHORT TERM GOAL #4   Title Patient negotiates ramps & curbs with RW with minimal guard. (Performed on  09/05/16)   Baseline Required mod A   Time 4   Period Weeks   Status Not Met           PT Long Term Goals - 10/10/16 2207      PT LONG TERM GOAL #1   Title Patient & CNA demonstrate understanding of HEP. (Target Date: 10/13/2016)   Time 8   Period Weeks   Status On-going     PT LONG TERM GOAL #2   Title Patient able to balance in standing with RW support reaching 10" and manages clothes safely.  (Target Date: 10/13/2016)    Baseline MET 10/10/2016   Time 8   Period Weeks   Status Achieved     PT LONG TERM GOAL #3   Title Patient ambulates 200' with RW with supervision for community mobility  (Target Date: 10/13/2016)   Baseline MET 10/10/2016   Time 8   Period Weeks   Status Achieved     PT LONG TERM GOAL #4   Title Patient negotiates ramps & curbs with RW and stairs with 2 rails with minimal assist for community access.  (Target Date: 10/13/2016)   Baseline MET 10/10/2016   Time 8   Period Weeks   Status Achieved     PT LONG TERM GOAL #5   Title Patient ambulates with RW around furniture 100' modified independent for household mobility.  (Target Date: 10/13/2016)   Baseline 10/10/2016 Pt reports ambulating at home independently with RW after CNAs leave for day M-F and son out on weekends.    Time 8   Period Weeks   Status Partially Met               Plan - 10/10/16 2212    Clinical Impression Statement Patient met or partially met 4 of 5 LTGs. Patient appears ready for discharge this week.    Rehab Potential Good   PT Frequency 2x / week   PT Duration 8 weeks   PT Treatment/Interventions ADLs/Self Care Home Management;DME Instruction;Gait training;Stair training;Functional mobility training;Therapeutic activities;Therapeutic exercise;Balance training;Neuromuscular re-education;Patient/family education   PT Next Visit Plan continue to work on standing balance especially weight shifting forwards, laterally to L, gait with RW and L foot advancement   Consulted and  Agree with Plan of Care Patient;Family member/caregiver   Family Member Consulted CNA      Patient will benefit from skilled therapeutic intervention in order to improve the following deficits and impairments:  Abnormal gait, Decreased activity tolerance, Decreased balance, Decreased coordination, Decreased knowledge of use of DME, Decreased mobility, Decreased range  of motion, Decreased strength, Postural dysfunction, Pain  Visit Diagnosis: Unsteadiness on feet  Muscle weakness (generalized)  Other abnormalities of gait and mobility  Paralytic gait     Problem List Patient Active Problem List   Diagnosis Date Noted  . Cerebrovascular accident (CVA) due to thrombosis of right middle cerebral artery ([place])   . Coronary artery disease involving native coronary artery of native heart without angina pectoris   . H/O mitral valve repair   . Paroxysmal atrial fibrillation (HCC)   . Dysarthria, post-stroke   . Dysphagia, post-stroke   . Cerebrovascular accident (CVA) due to thrombosis of precerebral artery ([place])   . Scoliosis (and kyphoscoliosis), idiopathic 01/26/2014  . ROTATOR CUFF SYNDROME, LEFT 03/07/2010  . OTH MALIG NEOPLASM SKIN OTH&UNSPEC PARTS FACE 12/06/2009  . ACTINIC KERATOSIS, HEAD 09/06/2009  . Osteoarthritis 02/20/2009  . MITRAL VALVE REPLACEMENT, HX OF 02/20/2009  . HIP REPLACEMENT, TOTAL, HX OF 02/20/2009  . APPENDECTOMY, HX OF 02/20/2009  . HERNIORRHAPHY, HX OF 02/20/2009  . EPISTAXIS, RECURRENT 11/19/2008  . UNS ADVRS EFF UNS RX MEDICINAL&BIOLOGICAL SBSTNC 06/12/2008  . OTHER AND UNSPECIFIED MITRAL VALVE DISEASES 03/12/2008  . ATRIAL FLUTTER 03/12/2008  . Essential hypertension 03/06/2007  . PEPTIC ULCER DISEASE 03/06/2007  . BENIGN PROSTATIC HYPERTROPHY 03/06/2007  . Coronary atherosclerosis 02/13/2007  . ROSACEA 02/13/2007  . DVT, HX OF 02/13/2007    Jamey Reas PT, DPT 10/10/2016, 10:25 PM  [facility] [address] [place], [place], [address] Phone: [phone]   Fax:  [phone]  Name: Brian [name] MRN: [id number] Date of Birth: 11-Nov-1932

## 2016-10-10 NOTE — Telephone Encounter (Signed)
xanax was refilled to [facility].

## 2016-10-11 ENCOUNTER — Telehealth: Payer: Self-pay | Admitting: Cardiovascular Disease

## 2016-10-11 NOTE — Telephone Encounter (Signed)
New Message     Pt is out of  ALPRAZolam (XANAX) 0.25 MG tablet Take 1 tablet (0.25 mg total) by mouth at bedtime.   And can not get it refilled for a few months, is it okay for him to be without it for that long?

## 2016-10-11 NOTE — Telephone Encounter (Signed)
Spoke to patient . Informed patient primary had called a prescription into [facility]  pharmacy  Patient unaware - asked if he could miss 2-3 days if necessary. States he only use at bedtime.  it should not be a problem from cardiac standpoint.  Patient verbalized understanding

## 2016-10-12 ENCOUNTER — Encounter: Payer: Medicare Other | Admitting: Occupational Therapy

## 2016-10-12 ENCOUNTER — Ambulatory Visit: Payer: Medicare Other | Admitting: Physical Therapy

## 2016-10-12 ENCOUNTER — Encounter: Payer: Self-pay | Admitting: Physical Therapy

## 2016-10-12 DIAGNOSIS — M6281 Muscle weakness (generalized): Secondary | ICD-10-CM

## 2016-10-12 DIAGNOSIS — R2689 Other abnormalities of gait and mobility: Secondary | ICD-10-CM

## 2016-10-12 DIAGNOSIS — R2681 Unsteadiness on feet: Secondary | ICD-10-CM

## 2016-10-12 DIAGNOSIS — I69354 Hemiplegia and hemiparesis following cerebral infarction affecting left non-dominant side: Secondary | ICD-10-CM | POA: Diagnosis not present

## 2016-10-12 DIAGNOSIS — R278 Other lack of coordination: Secondary | ICD-10-CM | POA: Diagnosis not present

## 2016-10-12 DIAGNOSIS — R261 Paralytic gait: Secondary | ICD-10-CM | POA: Diagnosis not present

## 2016-10-12 NOTE — Therapy (Signed)
[facility] [address] [place], [place], [address] Phone: [phone]   Fax:  [phone]  Physical Therapy Treatment  Patient Details  Name: Brian [name] MRN: [id number] Date of Birth: 1932/09/14 Referring Provider: Antony Contras, MD  Encounter Date: 10/12/2016      PT End of Session - 10/12/16 1420    Visit Number 14   Number of Visits 17   Date for PT Re-Evaluation 10/13/16   Authorization Type Medicare G-Code & progress note 10th visit   PT Start Time 1406   PT Stop Time 1445   PT Time Calculation (min) 39 min   Activity Tolerance Patient limited by fatigue;Patient tolerated treatment well   Behavior During Therapy [facility] for tasks assessed/performed      Past Medical History:  Diagnosis Date  . Anemia   . Atrial flutter ([place])   . BPH (benign prostatic hypertrophy)   . Epistaxis   . Hemorrhage of gastrointestinal tract, unspecified   . Hypertension   . Osteoarthrosis, unspecified whether generalized or localized, unspecified site   . Personal history of venous thrombosis and embolism   . Rosacea   . Stroke ([place])   . TIA (transient ischemic attack)     Past Surgical History:  Procedure Laterality Date  . APPENDECTOMY    . HERNIA REPAIR    . IR GENERIC HISTORICAL  05/04/2016   IR PERCUTANEOUS ART THROMBECTOMY/INFUSION INTRACRANIAL INC DIAG ANGIO 05/04/2016 [name], MD [facility]  . IR GENERIC HISTORICAL  05/04/2016   IR ANGIO VERTEBRAL SEL SUBCLAVIAN INNOMINATE UNI R MOD SED 05/04/2016 [name], MD [facility]  . IR GENERIC HISTORICAL  06/07/2016   IR RADIOLOGIST EVAL & MGMT 06/07/2016 [facility]  . JOINT REPLACEMENT    . MITRAL VALVE REPAIR    . mvp repair    . RADIOLOGY WITH ANESTHESIA N/A 05/04/2016   Procedure: RADIOLOGY WITH ANESTHESIA;  Surgeon: [name], MD;  Location: [place];  Service: Radiology;  Laterality: N/A;  . TOTAL HIP ARTHROPLASTY      There were  no vitals filed for this visit.      Subjective Assessment - 10/12/16 1419    Subjective No new complaints. Continues to feel as if PT has helped him.    Patient is accompained by: --  caregiver   Limitations Lifting;Standing;Walking;House hold activities   Patient Stated Goals To get balance & walking back to pre stroke level; used rollator    Currently in Pain? Yes   Pain Score 6    Pain Location Other (Comment)  knee down including foot   Pain Orientation Left   Pain Descriptors / Indicators Sore;Aching;Discomfort   Pain Type Chronic pain   Pain Onset More than a month ago   Pain Frequency Constant   Aggravating Factors  walking, standing, weight bearing on left leg   Pain Relieving Factors staying off feet/left leg            [facility] Adult PT Treatment/Exercise - 10/12/16 1422      Transfers   Transfers Sit to Stand;Stand to Sit   Sit to Stand 5: Supervision;With upper extremity assist;With armrests;From chair/3-in-1   Sit to Stand Details Visual cues/gestures for sequencing;Verbal cues for safe use of DME/AE   Sit to Stand Details (indicate cue type and reason) cues to scoot to edge of surface before standing to make it easier   Stand to Sit 5: Supervision;With upper extremity assist;With armrests;To chair/3-in-1   Stand to Sit  Details (indicate cue type and reason) Verbal cues for precautions/safety;Verbal cues for safe use of DME/AE;Verbal cues for sequencing   Stand to Sit Details cues to turn all the way to surface and fully align self before sitting down.     Ambulation/Gait   Ambulation/Gait Yes   Ambulation/Gait Assistance 5: Supervision   Ambulation/Gait Assistance Details continues to needed reminder cues on posture, walker position and for increased step length ( visual targets on bottom of walker, pt does well with step to red, step to yellow)                   Ambulation Distance (Feet) 115 Feet  x2   Assistive device Rolling walker   Gait Pattern Step-to  pattern;Decreased step length - right;Decreased stance time - left;Decreased stride length;Decreased hip/knee flexion - left;Decreased weight shift to left;Left circumduction;Left flexed knee in stance;Antalgic;Trunk flexed;Poor foot clearance - left   Ambulation Surface Level;Indoor   Gait velocity 53.78 sec's= 0.61 ft/sec with RW     Self-Care   Other Self-Care Comments  Reviewed current HEP iwht CNA while pt was on nustep to answer any questions and modify if any issues. CNA reports the biggest issues is pt moving to fast, like with long arc quads not doing full range. Discussed use of visual targets such as paper on floor and her hand to encourage full ranges. also discussed use of target to step over with hip abdct/addct seated as pt struggles with full range. Disussed having a tissue box/something that will give if he steps on it as a target close to him that he steps over/back in within smaller ranges, progressing to bigger ranges as strength improves. She verbalized understanding.                               Knee/Hip Exercises: Aerobic   Nustep Performed with UE's/LE's level 3x 10 minutes with goal >/= 30 steps per minute for strengthening and activity tolerance. cues to continue pace to >30 steps per minute.           PT Short Term Goals - 09/19/16 1443      PT SHORT TERM GOAL #1   Title Patient demonstrates understanding of initial HEP with CNA cueing correctly.  (Addressed 09/19/16)   Time 4   Period Weeks   Status Not Met     PT SHORT TERM GOAL #2   Title Patient reaches 7" anteriorly and within 10" of floor with RW support with supervision. (Addressed 09/19/16)   Time 4   Period Weeks   Status Achieved     PT SHORT TERM GOAL #3   Title Patient ambulates 200' with RW with supervision. (Addressed 09/12/16)   Baseline 09/12/16: 100 feet was max distance pt was able to achieve with RW; requires min A at times   Status Not Met     PT SHORT TERM GOAL #4   Title Patient  negotiates ramps & curbs with RW with minimal guard. (Performed on 09/05/16)   Baseline Required mod A   Time 4   Period Weeks   Status Not Met           PT Long Term Goals - 10/12/16 2215      PT LONG TERM GOAL #1   Title Patient & CNA demonstrate understanding of HEP. (Target Date: 10/13/2016)   Baseline 10/12/16: met today   Status Achieved     PT  LONG TERM GOAL #2   Title Patient able to balance in standing with RW support reaching 10" and manages clothes safely.  (Target Date: 10/13/2016)    Baseline MET 10/10/2016   Status Achieved     PT LONG TERM GOAL #3   Title Patient ambulates 200' with RW with supervision for community mobility  (Target Date: 10/13/2016)   Baseline MET 10/10/2016   Status Achieved     PT LONG TERM GOAL #4   Title Patient negotiates ramps & curbs with RW and stairs with 2 rails with minimal assist for community access.  (Target Date: 10/13/2016)   Baseline MET 10/10/2016   Status Achieved     PT LONG TERM GOAL #5   Title Patient ambulates with RW around furniture 100' modified independent for household mobility.  (Target Date: 10/13/2016)   Baseline 10/10/2016 Pt reports ambulating at home independently with RW after CNAs leave for day M-F and son out on weekends.    Status Partially Met            Plan - 11-02-16 1420    Clinical Impression Statement Pt has met 4 of 5 LTGs and partially met the remaining goals. Pt agreeable to discharge today. No questions from pt or caregiver.    Rehab Potential Good   PT Frequency 2x / week   PT Duration 8 weeks   PT Treatment/Interventions ADLs/Self Care Home Management;DME Instruction;Gait training;Stair training;Functional mobility training;Therapeutic activities;Therapeutic exercise;Balance training;Neuromuscular re-education;Patient/family education   PT Next Visit Plan discharge per PT plan of care   Consulted and Agree with Plan of Care Patient;Family member/caregiver   Family Member Consulted CNA       Patient will benefit from skilled therapeutic intervention in order to improve the following deficits and impairments:  Abnormal gait, Decreased activity tolerance, Decreased balance, Decreased coordination, Decreased knowledge of use of DME, Decreased mobility, Decreased range of motion, Decreased strength, Postural dysfunction, Pain  Visit Diagnosis: Unsteadiness on feet  Muscle weakness (generalized)  Other abnormalities of gait and mobility  Hemiplegia and hemiparesis following cerebral infarction affecting left non-dominant side (HCC)       G-Codes - 11/02/16 2215-10-12    Functional Assessment Tool Used (Outpatient Only) Pt reaches 10" anteriorly with RW support with supervision   Functional Limitation Changing and maintaining body position      Problem List Patient Active Problem List   Diagnosis Date Noted  . Cerebrovascular accident (CVA) due to thrombosis of right middle cerebral artery ([place])   . Coronary artery disease involving native coronary artery of native heart without angina pectoris   . H/O mitral valve repair   . Paroxysmal atrial fibrillation (HCC)   . Dysarthria, post-stroke   . Dysphagia, post-stroke   . Cerebrovascular accident (CVA) due to thrombosis of precerebral artery ([place])   . Scoliosis (and kyphoscoliosis), idiopathic 01/26/2014  . ROTATOR CUFF SYNDROME, LEFT 03/07/2010  . OTH MALIG NEOPLASM SKIN OTH&UNSPEC PARTS FACE 12/06/2009  . ACTINIC KERATOSIS, HEAD 09/06/2009  . Osteoarthritis 02/20/2009  . MITRAL VALVE REPLACEMENT, HX OF 02/20/2009  . HIP REPLACEMENT, TOTAL, HX OF 02/20/2009  . APPENDECTOMY, HX OF 02/20/2009  . HERNIORRHAPHY, HX OF 02/20/2009  . EPISTAXIS, RECURRENT 11/19/2008  . UNS ADVRS EFF UNS RX MEDICINAL&BIOLOGICAL SBSTNC 06/12/2008  . OTHER AND UNSPECIFIED MITRAL VALVE DISEASES 03/12/2008  . ATRIAL FLUTTER 03/12/2008  . Essential hypertension 03/06/2007  . PEPTIC ULCER DISEASE 03/06/2007  . BENIGN PROSTATIC HYPERTROPHY  03/06/2007  . Coronary atherosclerosis 02/13/2007  . ROSACEA 02/13/2007  .  DVT, HX OF 02/13/2007    Willow Ora, PTA, [facility] [address], [place], [place] [address] [phone] 10/12/16, 10:25 PM   Name: Brian [name] MRN: [id number] Date of Birth: 06/19/1933

## 2016-10-13 ENCOUNTER — Encounter: Payer: Self-pay | Admitting: Physical Therapy

## 2016-10-13 NOTE — Therapy (Signed)
[facility] [name] [place], [place], [address] Phone: [phone]   Fax:  [phone]  Patient Details  Name: Brian [name] MRN: [id number] Date of Birth: 09-22-1932 Referring Provider:  Antony Contras, MD  Encounter Date: 10/13/2016  PHYSICAL THERAPY DISCHARGE SUMMARY  Visits from Start of Care: 14  Current functional level related to goals / functional outcomes:     PT Long Term Goals - 10/12/16 2215      PT LONG TERM GOAL #1   Title Patient & CNA demonstrate understanding of HEP. (Target Date: 10/13/2016)   Baseline 10/12/16: met today   Status Achieved     PT LONG TERM GOAL #2   Title Patient able to balance in standing with RW support reaching 10" and manages clothes safely.  (Target Date: 10/13/2016)    Baseline MET 10/10/2016   Status Achieved     PT LONG TERM GOAL #3   Title Patient ambulates 200' with RW with supervision for community mobility  (Target Date: 10/13/2016)   Baseline MET 10/10/2016   Status Achieved     PT LONG TERM GOAL #4   Title Patient negotiates ramps & curbs with RW and stairs with 2 rails with minimal assist for community access.  (Target Date: 10/13/2016)   Baseline MET 10/10/2016   Status Achieved     PT LONG TERM GOAL #5   Title Patient ambulates with RW around furniture 100' modified independent for household mobility.  (Target Date: 10/13/2016)   Baseline 10/10/2016 Pt reports ambulating at home independently with RW after CNAs leave for day M-F and son out on weekends.    Status Partially Met       Remaining deficits: Patient continues to have weakness and ROM associated with severe osteoarthritis.  His gait is dependent on walker for safety. He appears safe for limited distances familiar environment like his home and supervision / assist for barriers for community gait.    Education / Equipment: HEP / ongoing fitness.   Plan: Patient agrees to discharge.  Patient goals  were partially met. Patient is being discharged due to meeting the stated rehab goals.  ?????        Jamey Reas PT, DPT 10/13/2016, 1:22 PM  [facility] [address] [place], [place], [address] Phone: [phone]   Fax:  [phone]

## 2016-10-19 ENCOUNTER — Ambulatory Visit (INDEPENDENT_AMBULATORY_CARE_PROVIDER_SITE_OTHER): Payer: Medicare Other | Admitting: Internal Medicine

## 2016-10-19 ENCOUNTER — Encounter: Payer: Self-pay | Admitting: Internal Medicine

## 2016-10-19 VITALS — BP 142/68 | HR 62 | Temp 97.6°F | Ht 67.0 in | Wt 151.6 lb

## 2016-10-19 DIAGNOSIS — I6300 Cerebral infarction due to thrombosis of unspecified precerebral artery: Secondary | ICD-10-CM | POA: Diagnosis not present

## 2016-10-19 DIAGNOSIS — I483 Typical atrial flutter: Secondary | ICD-10-CM | POA: Diagnosis not present

## 2016-10-19 DIAGNOSIS — I10 Essential (primary) hypertension: Secondary | ICD-10-CM

## 2016-10-19 DIAGNOSIS — I63311 Cerebral infarction due to thrombosis of right middle cerebral artery: Secondary | ICD-10-CM | POA: Diagnosis not present

## 2016-10-19 MED ORDER — ALPRAZOLAM 0.25 MG PO TABS: 0.2500 mg | ORAL_TABLET | Freq: Every day | ORAL | 0 refills | Status: DC

## 2016-10-19 NOTE — Progress Notes (Signed)
Subjective:    Patient ID: Brian [name], male    DOB: [birth year], 83 y.o.   MRN: [id number]  HPI  83 year old patient who is seen today in follow-up.  He has a history of essential hypertension, status post right MCA stroke.   He continues to benefit from physical therapy. Patient is doing quite well today.  He is eating well and regaining his strength.  He has multiple questions concerning his medications.  Accompanied by his daughter today. Recent laboratory studies reviewed  Past Medical History:  Diagnosis Date  . Anemia   . Atrial flutter ([place])   . BPH (benign prostatic hypertrophy)   . Epistaxis   . Hemorrhage of gastrointestinal tract, unspecified   . Hypertension   . Osteoarthrosis, unspecified whether generalized or localized, unspecified site   . Personal history of venous thrombosis and embolism   . Rosacea   . Stroke ([place])   . TIA (transient ischemic attack)      Social History   Social History  . Marital status: Married    Spouse name: N/A  . Number of children: N/A  . Years of education: N/A   Occupational History  . Not on file.   Social History Main Topics  . Smoking status: Former Research scientist (life sciences)  . Smokeless tobacco: Never Used     Comment: quit 50 yr ago  . Alcohol use No  . Drug use: No  . Sexual activity: Not on file   Other Topics Concern  . Not on file   Social History Narrative  . No narrative on file    Past Surgical History:  Procedure Laterality Date  . APPENDECTOMY    . HERNIA REPAIR    . IR GENERIC HISTORICAL  05/04/2016   IR PERCUTANEOUS ART THROMBECTOMY/INFUSION INTRACRANIAL INC DIAG ANGIO 05/04/2016 [name], MD [facility]  . IR GENERIC HISTORICAL  05/04/2016   IR ANGIO VERTEBRAL SEL SUBCLAVIAN INNOMINATE UNI R MOD SED 05/04/2016 [name], MD [facility]  . IR GENERIC HISTORICAL  06/07/2016   IR RADIOLOGIST EVAL & MGMT 06/07/2016 [facility]  . JOINT REPLACEMENT    . MITRAL VALVE REPAIR    . mvp repair    .  RADIOLOGY WITH ANESTHESIA N/A 05/04/2016   Procedure: RADIOLOGY WITH ANESTHESIA;  Surgeon: [name], MD;  Location: [facility];  Service: Radiology;  Laterality: N/A;  . TOTAL HIP ARTHROPLASTY      Family History  Problem Relation Age of Onset  . Rheumatic fever Mother   . Coronary artery disease Father     Allergies  Allergen Reactions  . Novocain [Procaine Hcl]     Irregular heart beat     . Penicillins     REACTION: itching    Current Outpatient Prescriptions on File Prior to Visit  Medication Sig Dispense Refill  . apixaban (ELIQUIS) 2.5 MG TABS tablet Take 1 tablet (2.5 mg total) by mouth 2 (two) times daily. 60 tablet 1  . clindamycin (CLEOCIN) 150 MG capsule TAKE 2 CAPSULES BY MOUTH PRIOR TO DENTAL WORK AS DIRECTED 2 capsule 2  . finasteride (PROSCAR) 5 MG tablet Take 1 tablet (5 mg total) by mouth daily. 90 tablet 3  . hydrocortisone-pramoxine (ANALPRAM-HC) 2.5-1 % rectal cream Place 1 application rectally 3 (three) times daily. 30 g 0  . metoprolol succinate (TOPROL-XL) 25 MG 24 hr tablet TAKE 1 TABLET BY MOUTH EVERY MORNING AND 2 TABLETS BY MOUTH EVERY EVENING 270 tablet 0  . pantoprazole (PROTONIX) 40 MG tablet  TAKE 1 TABLET BY MOUTH DAILY 90 tablet 3  . pravastatin (PRAVACHOL) 20 MG tablet Take 1 tablet (20 mg total) by mouth daily. 90 tablet 3  . sertraline (ZOLOFT) 25 MG tablet Take 1 tablet (25 mg total) by mouth daily. 30 tablet 5   No current facility-administered medications on file prior to visit.     BP (!) 142/68 (BP Location: Left Arm, Patient Position: Sitting, Cuff Size: Normal)   Pulse 62   Temp 97.6 F (36.4 C) (Oral)   Ht 5\' 7"  (1.702 m)   Wt 158 lb 3.2 oz (71.8 kg)   SpO2 98%   BMI 24.78 kg/m     Review of Systems  Constitutional: Negative for appetite change, chills, fatigue and fever.  HENT: Negative for congestion, dental problem, ear pain, hearing loss, sore throat, tinnitus, trouble swallowing and voice change.   Eyes: Negative for  pain, discharge and visual disturbance.  Respiratory: Negative for cough, chest tightness, wheezing and stridor.   Cardiovascular: Negative for chest pain, palpitations and leg swelling.  Gastrointestinal: Negative for abdominal distention, abdominal pain, blood in stool, constipation, diarrhea, nausea and vomiting.  Genitourinary: Negative for difficulty urinating, discharge, flank pain, genital sores, hematuria and urgency.  Musculoskeletal: Positive for gait problem. Negative for arthralgias, back pain, joint swelling, myalgias and neck stiffness.  Skin: Negative for rash.  Neurological: Positive for weakness. Negative for dizziness, syncope, speech difficulty, numbness and headaches.  Hematological: Negative for adenopathy. Does not bruise/bleed easily.  Psychiatric/Behavioral: Negative for behavioral problems and dysphoric mood. The patient is nervous/anxious.        Objective:   Physical Exam  Constitutional: He is oriented to person, place, and time. He appears well-developed.  Blood pressure well controlled  HENT:  Head: Normocephalic.  Right Ear: External ear normal.  Left Ear: External ear normal.  Eyes: Conjunctivae and EOM are normal.  Neck: Normal range of motion.  Cardiovascular: Normal rate and normal heart sounds.   Pulmonary/Chest: Breath sounds normal.  Abdominal: Bowel sounds are normal.  Musculoskeletal: Normal range of motion. He exhibits no edema or tenderness.  Neurological: He is alert and oriented to person, place, and time.  Left-sided weakness  Psychiatric: He has a normal mood and affect. His behavior is normal.          Assessment & Plan:   Essential hypertension, well-controlled Dyslipidemia stable Status post right MCA stroke  Medications updated Follow-up 3 months or as needed  Nyoka Cowden

## 2016-10-19 NOTE — Progress Notes (Signed)
Pre visit review using our clinic review tool, if applicable. No additional management support is needed unless otherwise documented below in the visit note. 

## 2016-10-19 NOTE — Patient Instructions (Signed)
Limit your sodium (Salt) intake  Continue physical therapy  Return in 3 months for follow-up

## 2016-10-31 ENCOUNTER — Telehealth: Payer: Self-pay

## 2016-10-31 NOTE — Telephone Encounter (Signed)
RN call [facility] consultation at [phone]. Rn spoke with customer service to state Dr. Leonie Man is his stroke md only. Rn explain Dr. Leonie Man does not treat back pain or arthritis. Rn stated this is the 2nd fax for this year. Rn stated future fax need to be forward to pts PCP or back or pain doctor who is treating the physician.

## 2016-11-07 DIAGNOSIS — R3914 Feeling of incomplete bladder emptying: Secondary | ICD-10-CM | POA: Diagnosis not present

## 2016-11-07 DIAGNOSIS — N3941 Urge incontinence: Secondary | ICD-10-CM | POA: Diagnosis not present

## 2016-11-07 DIAGNOSIS — R351 Nocturia: Secondary | ICD-10-CM | POA: Diagnosis not present

## 2016-11-16 ENCOUNTER — Telehealth: Payer: Self-pay

## 2016-11-16 NOTE — Telephone Encounter (Signed)
Patient called to inquire about possibly taking Advil for arthritis pain. Advised pt that with his current medications and medical history ibuprofen is not advised. Recommended he try Tyelnol instead and reviewed daily dosing limits with him. Pt will try Tylenol for the next few days and call office next week if pain not improving. Nothing further needed at this time.

## 2016-11-22 ENCOUNTER — Telehealth: Payer: Self-pay | Admitting: Cardiovascular Disease

## 2016-11-22 NOTE — Telephone Encounter (Signed)
New message  Brian [name] pt CMA call requesting to speak with RN about using an over the counter patch or taking aleve for his arthritis. Pt would like to know would either would cause a reaction. Please call back to discuss

## 2016-11-22 NOTE — Telephone Encounter (Signed)
New Message     Pt called back missed your call.  Advised pt he would have to add [name] on the [facility] for her to get information

## 2016-11-22 NOTE — Telephone Encounter (Signed)
Any medication can cause adverse reaction to patient but over the counter medication are low risk for ADR when use as recommended.   Due to current anticoagulation therapy will recommend against using Aleve, ibuprofen, high dose aspirin or any other NSAIDs.  Also noted allergy to novocaine.   Okay to use topical patch, cream or gel. May take tylenol 500mg  up to 4 pills per day as well.  For any additional pain management please contact PCP

## 2016-11-22 NOTE — Telephone Encounter (Signed)
LEFT MESSAGE TO CALL BACK  WILL NEED PATIENT PERMISSION TO TALK TO Aldona Bar

## 2016-11-22 NOTE — Telephone Encounter (Signed)
Will defer to [facility] - and will call patient's CMA back

## 2016-11-22 NOTE — Telephone Encounter (Signed)
Spoke to patient. Patient gave a verbal okay to speak to [facility] in regards to to this phone call.   Brian [name] present - information relayed to her in regards to recommendation. Verbalized understanding.

## 2016-11-26 ENCOUNTER — Other Ambulatory Visit: Payer: Self-pay | Admitting: Neurology

## 2016-11-27 NOTE — Telephone Encounter (Signed)
Rn call pharmacy that pts refill should be manage by his cardiologist Brian [name] if possible. Dr Leonie Man did a one time refill for patient,and wants his PCP or cardiologist to continue refills on the eliquis.

## 2016-11-28 ENCOUNTER — Other Ambulatory Visit: Payer: Self-pay | Admitting: Internal Medicine

## 2016-12-25 ENCOUNTER — Other Ambulatory Visit: Payer: Self-pay | Admitting: Internal Medicine

## 2016-12-28 ENCOUNTER — Telehealth: Payer: Self-pay | Admitting: Internal Medicine

## 2016-12-28 NOTE — Telephone Encounter (Signed)
° ° ° ° °  Pharmacy call to to say pt is asking for a early refill on the below med  Pharmacy is aware Dr Raliegh Ip is out of office   ALPRAZolam (XANAX) 0.25 MG tablet

## 2016-12-29 NOTE — Telephone Encounter (Signed)
Pt calling to check the status and would like to have it refill today due to him being out.

## 2016-12-29 NOTE — Telephone Encounter (Signed)
This medication was refilled 10/19/16 for #90 with 0 refills. Patient should not be due for refill until 01/19/17. Please advise.

## 2017-01-01 ENCOUNTER — Telehealth: Payer: Self-pay | Admitting: Internal Medicine

## 2017-01-01 ENCOUNTER — Other Ambulatory Visit: Payer: Self-pay | Admitting: Internal Medicine

## 2017-01-01 MED ORDER — ALPRAZOLAM 0.25 MG PO TABS: 0.2500 mg | ORAL_TABLET | Freq: Every day | ORAL | 0 refills | Status: DC

## 2017-01-01 MED ORDER — ALPRAZOLAM 0.25 MG PO TABS: 0.3750 mg | ORAL_TABLET | Freq: Every day | ORAL | 0 refills | Status: DC

## 2017-01-01 NOTE — Telephone Encounter (Signed)
Spoke with pt and advised, appt scheduled. Rx called in to pharmacy. Nothing further needed.

## 2017-01-01 NOTE — Telephone Encounter (Signed)
Pt need new Rx for alprazolam   Pharm:  [facility] [place].  Sending a DPR for the pt to fill out to add Daughter Engineer, site).

## 2017-01-01 NOTE — Telephone Encounter (Signed)
Okay for early refill Please schedule follow-up office visit within the next 1 or 2 weeks

## 2017-01-01 NOTE — Telephone Encounter (Signed)
Spoke with pt's wife and pt's caregiver who report that pt is likely taking more Xanax than prescribed as he keeps running out early. They think that maybe due to stroke he does not realize he is taking more than ordered. They would like to ask for early fill of Xanax.  Dr. Raliegh Ip - Please advise. Thanks!

## 2017-01-02 NOTE — Telephone Encounter (Signed)
Rx has been called in. Nothing further needed.

## 2017-01-08 ENCOUNTER — Ambulatory Visit (INDEPENDENT_AMBULATORY_CARE_PROVIDER_SITE_OTHER): Payer: Medicare Other | Admitting: Cardiovascular Disease

## 2017-01-08 ENCOUNTER — Encounter: Payer: Self-pay | Admitting: Cardiovascular Disease

## 2017-01-08 VITALS — BP 106/70 | HR 87 | Ht 64.0 in | Wt 149.8 lb

## 2017-01-08 DIAGNOSIS — I481 Persistent atrial fibrillation: Secondary | ICD-10-CM | POA: Diagnosis not present

## 2017-01-08 DIAGNOSIS — E7800 Pure hypercholesterolemia, unspecified: Secondary | ICD-10-CM

## 2017-01-08 DIAGNOSIS — I10 Essential (primary) hypertension: Secondary | ICD-10-CM | POA: Diagnosis not present

## 2017-01-08 DIAGNOSIS — Z9889 Other specified postprocedural states: Secondary | ICD-10-CM

## 2017-01-08 DIAGNOSIS — I4819 Other persistent atrial fibrillation: Secondary | ICD-10-CM

## 2017-01-08 DIAGNOSIS — I63411 Cerebral infarction due to embolism of right middle cerebral artery: Secondary | ICD-10-CM

## 2017-01-08 NOTE — Patient Instructions (Signed)

## 2017-01-08 NOTE — Progress Notes (Signed)
Cardiology Office Note   Date:  01/08/2017   ID:  Brian [name], Brian [name] 04-May-1933, MRN [id number]  PCP:  Marletta Lor, MD  Cardiologist:   Skeet Latch, MD   Chief Complaint  Patient presents with  . Follow-up    3 month;     History of Present Illness: Brian [name] is a 83 y.o. male with paroxysmal atrial fibrillation, GI bleeds, prior stroke, hypertension, prior mitral vale repair who presents for follow up.  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  He underwent embolectomy. INR was 2.8 on admission.  He was switched to Eliquis 2.5 mg bid prior to discharge.  Brian [name] had a gastric ulcer in 2002 and required transfusion at that time. This was subsequently treated and he has not had any recurrent GI bleeding.  Brian [name] had his mitral valve repaired at [facility] in 2002. Sanden He had an echo 05/06/16 that revealed LVEF 55-60% and mildly elevated pulmonary pressures.  He had a [employer] 01/2004 that revealed LVEF 64% and a nonreversible inferior defect that was thought to be diaphragm attenuation versus infarct. He had an echo 10/2009 with normal systolic function, mild aortic regurgitation and mild mitral regurgitation.  He was last seen by Dr. Stanford Breed 01/2013 at which time he was felt to be in atrial flutter. He was well rate-controlled and was asymptomatic.   Since his last appointment Brian [name] has been well.  His main complaint is pain in the left groin and L knee.  His physical therapy has completed.  He is no longer getting exercise because of pain in his knee.  He is scheduled to see an orthopedic surgeon in three days.  He is no longer walking due to the pain.  Brian [name] Denies chest pain or shortness of breath. He has not noted any lower extremity edema, orthopnea, or PND. He also denies lightheadedness, dizziness, or palpitations.   Past Medical History:    Diagnosis Date  . Anemia   . Atrial flutter ([place])   . BPH (benign prostatic hypertrophy)   . Epistaxis   . Hemorrhage of gastrointestinal tract, unspecified   . Hypertension   . Osteoarthrosis, unspecified whether generalized or localized, unspecified site   . Personal history of venous thrombosis and embolism   . Rosacea   . Stroke ([place])   . TIA (transient ischemic attack)     Past Surgical History:  Procedure Laterality Date  . APPENDECTOMY    . HERNIA REPAIR    . IR GENERIC HISTORICAL  05/04/2016   IR PERCUTANEOUS ART THROMBECTOMY/INFUSION INTRACRANIAL INC DIAG ANGIO 05/04/2016 Brian Bras, MD [facility]  . IR GENERIC HISTORICAL  05/04/2016   IR ANGIO VERTEBRAL SEL SUBCLAVIAN INNOMINATE UNI R MOD SED 05/04/2016 Brian Bras, MD [facility]  . IR GENERIC HISTORICAL  06/07/2016   IR RADIOLOGIST EVAL & MGMT 06/07/2016 [facility]  . JOINT REPLACEMENT    . MITRAL VALVE REPAIR    . mvp repair    . RADIOLOGY WITH ANESTHESIA N/A 05/04/2016   Procedure: RADIOLOGY WITH ANESTHESIA;  Surgeon: Brian Bras, MD;  Location: [place];  Service: Radiology;  Laterality: N/A;  . TOTAL HIP ARTHROPLASTY       Current Outpatient Prescriptions  Medication Sig Dispense Refill  . ALPRAZolam (XANAX) 0.25 MG tablet Take 1.5 tablets (0.375 mg total) by mouth at bedtime. 90 tablet 0  .  clindamycin (CLEOCIN) 150 MG capsule TAKE 2 CAPSULES BY MOUTH PRIOR TO DENTAL WORK AS DIRECTED 2 capsule 2  . doxycycline (VIBRA-TABS) 100 MG tablet TAKE 1 TABLET BY MOUTH TWICE DAILY 180 tablet 0  . ELIQUIS 2.5 MG TABS tablet TAKE 1 TABLET BY MOUTH TWICE DAILY 60 tablet 0  . finasteride (PROSCAR) 5 MG tablet Take 1 tablet (5 mg total) by mouth daily. 90 tablet 3  . hydrocortisone-pramoxine (ANALPRAM-HC) 2.5-1 % rectal cream Place 1 application rectally 3 (three) times daily. 30 g 0  . metoprolol succinate (TOPROL-XL) 25 MG 24 hr tablet TAKE 1 TABLET BY MOUTH EVERY MORNING AND 2 TABLETS BY MOUTH EVERY  EVENING 270 tablet 0  . pantoprazole (PROTONIX) 40 MG tablet TAKE 1 TABLET BY MOUTH DAILY 90 tablet 3  . pravastatin (PRAVACHOL) 20 MG tablet Take 1 tablet (20 mg total) by mouth daily. 90 tablet 3  . sertraline (ZOLOFT) 25 MG tablet Take 1 tablet (25 mg total) by mouth daily. 30 tablet 5   No current facility-administered medications for this visit.     Allergies:   Novocain [procaine hcl] and Penicillins    Social History:  The patient  reports that he has quit smoking. He has never used smokeless tobacco. He reports that he does not drink alcohol or use drugs.   Family History:  The patient's family history includes Coronary artery disease in his father; Rheumatic fever in his mother.    ROS:  Please see the history of present illness.   Otherwise, review of systems are positive for none.   All other systems are reviewed and negative.    PHYSICAL EXAM: VS:  BP 106/70   Pulse 87   Ht 5\' 4"  (1.626 m)   Wt 67.9 kg (149 lb 12.8 oz)   BMI 25.71 kg/m  , BMI Body mass index is 25.71 kg/m. GENERAL:  Well appearing.  No acute distress HEENT:  Pupils equal round and reactive, fundi not visualized, oral mucosa unremarkable NECK:  No jugular venous distention, waveform within normal limits, carotid upstroke brisk and symmetric, no bruits LUNGS:  Clear to auscultation bilaterally.  No crackles, rhonchi, or wheezes. HEART:  Irregularly irregular.  PMI not displaced or sustained,S1 and S2 within normal limits, no S3, no S4, no clicks, no rubs, III/VI holosystolic murmur at the apex ABD:  Flat, positive bowel sounds normal in frequency in pitch, no bruits, no rebound, no guarding, no midline pulsatile mass, no hepatomegaly, no splenomegaly EXT:  2 plus pulses throughout, no edema, no cyanosis no clubbing SKIN:  No rashes no nodules NEURO:  Cranial nerves II through XII grossly intact, motor grossly intact throughout PSYCH:  Cognitively intact, oriented to person place and time  EKG:  EKG is  not ordered today. 07/04/16:  Atrial fibrillation. Rate 76 bpm. Left anterior fascicular block. Left ventricular hypertrophy with repolarization in about these.  Echo 05/06/16: Study Conclusions  - Left ventricle: The cavity size was normal. Wall thickness was   normal. Systolic function was normal. The estimated ejection   fraction was in the range of 55% to 60%. Wall motion was normal;   there were no regional wall motion abnormalities. Mitral   annuloplasty precludes evaluation of LV diastolic function. - Ventricular septum: Septal motion showed paradox. These changes   are consistent with a post-thoracotomy state. - Mitral valve: Prior procedures included surgical repair. An   annular ring prosthesis was present and functioning normally.   Valve area by pressure half-time: 2.47 cm^2. -  Left atrium: The atrium was mildly dilated. - Pulmonary arteries: Systolic pressure was mildly increased. PA   peak pressure: 44 mm Hg (S).  Recent Labs: 05/07/2016: Magnesium 1.8 09/26/2016: Hemoglobin 15.1; Platelets 251.0; TSH 2.99 10/03/2016: ALT 13; BUN 13; Creat 0.64; Potassium 4.3; Sodium 136    Lipid Panel    Component Value Date/Time   CHOL 169 10/03/2016 1157   TRIG 110 10/03/2016 1157   HDL 49 10/03/2016 1157   CHOLHDL 3.4 10/03/2016 1157   VLDL 22 10/03/2016 1157   LDLCALC 98 10/03/2016 1157   LDLDIRECT 80.6 05/27/2009 0857      Wt Readings from Last 3 Encounters:  01/08/17 67.9 kg (149 lb 12.8 oz)  10/19/16 68.8 kg (151 lb 9.6 oz)  10/03/16 67.1 kg (148 lb)      ASSESSMENT AND PLAN:  # Persistent atrial fibrillation:  # Prior embolic stroke/hemorrhagic: Rates are well controlled on metoprolol and he is asymptomatic. His Eliquis is underdosed due to history of GI bleed, ICH, low weight (67kg) and age >20.  Rates are well-controlled on metoprolol. This patients CHA2DS2-VASc Score and unadjusted Ischemic Stroke Rate (% per year) is equal to 7.2 % stroke rate/year from a score  of 5 Above score calculated as 1 point each if present [CHF, HTN, DM, Vascular=MI/PAD/Aortic Plaque, Age if 65-74, or Male] Above score calculated as 2 points each if present [Age > 75, or Stroke/TIA/TE]  # Hypertension: Blood pressure is well-controlled on metoprolol.  # Mitral valve repair: Valve stable on echo 04/2016.  # Hyperlipidemia: LDL 98 09/2016. Continue pravastatin.   Current medicines are reviewed at length with the patient today.  The patient does not have concerns regarding medicines.  The following changes have been made:  no change  Labs/ tests ordered today include:   No orders of the defined types were placed in this encounter.   Disposition:   FU with Rachael Zapanta C. Oval Linsey, MD, [facility] in 6 months    This note was written with the assistance of speech recognition software.  Please excuse any transcriptional errors.  Signed, Rosealyn Little C. Oval Linsey, MD, [facility]  01/08/2017 2:54 PM    [facility]

## 2017-01-11 DIAGNOSIS — M25512 Pain in left shoulder: Secondary | ICD-10-CM | POA: Diagnosis not present

## 2017-01-11 DIAGNOSIS — G8929 Other chronic pain: Secondary | ICD-10-CM | POA: Diagnosis not present

## 2017-01-11 DIAGNOSIS — M25562 Pain in left knee: Secondary | ICD-10-CM | POA: Diagnosis not present

## 2017-01-11 DIAGNOSIS — M1712 Unilateral primary osteoarthritis, left knee: Secondary | ICD-10-CM | POA: Diagnosis not present

## 2017-01-16 ENCOUNTER — Encounter: Payer: Self-pay | Admitting: Internal Medicine

## 2017-01-16 ENCOUNTER — Ambulatory Visit (INDEPENDENT_AMBULATORY_CARE_PROVIDER_SITE_OTHER): Payer: Medicare Other | Admitting: Internal Medicine

## 2017-01-16 VITALS — BP 122/78 | HR 70 | Temp 98.3°F | Ht 64.0 in | Wt 150.0 lb

## 2017-01-16 DIAGNOSIS — I6300 Cerebral infarction due to thrombosis of unspecified precerebral artery: Secondary | ICD-10-CM | POA: Diagnosis not present

## 2017-01-16 DIAGNOSIS — I2510 Atherosclerotic heart disease of native coronary artery without angina pectoris: Secondary | ICD-10-CM | POA: Diagnosis not present

## 2017-01-16 DIAGNOSIS — I10 Essential (primary) hypertension: Secondary | ICD-10-CM | POA: Diagnosis not present

## 2017-01-16 DIAGNOSIS — I480 Paroxysmal atrial fibrillation: Secondary | ICD-10-CM | POA: Diagnosis not present

## 2017-01-16 DIAGNOSIS — M8949 Other hypertrophic osteoarthropathy, multiple sites: Secondary | ICD-10-CM

## 2017-01-16 DIAGNOSIS — M150 Primary generalized (osteo)arthritis: Secondary | ICD-10-CM

## 2017-01-16 DIAGNOSIS — M159 Polyosteoarthritis, unspecified: Secondary | ICD-10-CM

## 2017-01-16 MED ORDER — METOPROLOL SUCCINATE ER 25 MG PO TB24: 25.0000 mg | ORAL_TABLET | Freq: Every day | ORAL | 0 refills | Status: DC

## 2017-01-16 MED ORDER — ALPRAZOLAM 0.25 MG PO TABS: 0.2500 mg | ORAL_TABLET | Freq: Every day | ORAL | 0 refills | Status: DC

## 2017-01-16 MED ORDER — ALPRAZOLAM 0.25 MG PO TABS
0.2500 mg | ORAL_TABLET | Freq: Every day | ORAL | 0 refills | Status: DC
Start: 2017-01-16 — End: 2017-04-24

## 2017-01-16 MED ORDER — CLINDAMYCIN HCL 150 MG PO CAPS: ORAL_CAPSULE | ORAL | 2 refills | Status: DC

## 2017-01-16 MED ORDER — PANTOPRAZOLE SODIUM 40 MG PO TBEC: 40.0000 mg | DELAYED_RELEASE_TABLET | Freq: Every day | ORAL | 3 refills | Status: DC

## 2017-01-16 MED ORDER — SERTRALINE HCL 25 MG PO TABS: 25.0000 mg | ORAL_TABLET | Freq: Every day | ORAL | 5 refills | Status: DC

## 2017-01-16 MED ORDER — PRAVASTATIN SODIUM 20 MG PO TABS: 20.0000 mg | ORAL_TABLET | Freq: Every day | ORAL | 3 refills | Status: DC

## 2017-01-16 MED ORDER — FINASTERIDE 5 MG PO TABS: 5.0000 mg | ORAL_TABLET | Freq: Every day | ORAL | 3 refills | Status: DC

## 2017-01-16 MED ORDER — APIXABAN 2.5 MG PO TABS: 2.5000 mg | ORAL_TABLET | Freq: Two times a day (BID) | ORAL | 0 refills | Status: DC

## 2017-01-16 MED ORDER — HYDROCORTISONE ACE-PRAMOXINE 2.5-1 % RE CREA: 1.0000 "application " | TOPICAL_CREAM | Freq: Three times a day (TID) | RECTAL | 0 refills | Status: DC

## 2017-01-16 NOTE — Patient Instructions (Addendum)
WE NOW OFFER   Brian [name]'s FAST TRACK!!!  SAME DAY Appointments for ACUTE CARE  Such as: Sprains, Injuries, cuts, abrasions, rashes, muscle pain, joint pain, back pain Colds, flu, sore throats, headache, allergies, cough, fever  Ear pain, sinus and eye infections Abdominal pain, nausea, vomiting, diarrhea, upset stomach Animal/insect bites  3 Easy Ways to Schedule: Walk-In Scheduling Call in scheduling Mychart Sign-up: [email or web address]      Limit your sodium (Salt) intake  Return in 3 months for follow-up  Please check your blood pressure on a regular basis.  If it is consistently greater than 150/90, please make an office appointment.

## 2017-01-16 NOTE — Progress Notes (Signed)
Subjective:    Patient ID: Brian [name], male    DOB: 1933-06-05, 83 y.o.   MRN: [id number]  HPI 83 year old patient who has a history of cervical vascular disease and essential hypertension. He has paroxysmal atrial fibrillation and remains on anticoagulation. He has seen orthopedics recently due to significant left knee and left shoulder pain.  He does have left-sided weakness secondary to a prior stroke.  He received intra-articular injections with some benefit, but he remains symptomatic.  He wonders if tramadol might be a good analgesic option. He is unable to take anti-inflammatory drugs due to anticoagulation therapy He takes SSRI as well as a benzodiazepine and drug drug interactions with tramadol.  Discussed  Past Medical History:  Diagnosis Date  . Anemia   . Atrial flutter ([place])   . BPH (benign prostatic hypertrophy)   . Epistaxis   . Hemorrhage of gastrointestinal tract, unspecified   . Hypertension   . Osteoarthrosis, unspecified whether generalized or localized, unspecified site   . Personal history of venous thrombosis and embolism   . Rosacea   . Stroke ([place])   . TIA (transient ischemic attack)      Social History   Social History  . Marital status: Married    Spouse name: N/A  . Number of children: N/A  . Years of education: N/A   Occupational History  . Not on file.   Social History Main Topics  . Smoking status: Former Research scientist (life sciences)  . Smokeless tobacco: Never Used     Comment: quit 50 yr ago  . Alcohol use No  . Drug use: No  . Sexual activity: Not on file   Other Topics Concern  . Not on file   Social History Narrative  . No narrative on file    Past Surgical History:  Procedure Laterality Date  . APPENDECTOMY    . HERNIA REPAIR    . IR GENERIC HISTORICAL  05/04/2016   IR PERCUTANEOUS ART THROMBECTOMY/INFUSION INTRACRANIAL INC DIAG ANGIO 05/04/2016 [name], MD [facility]  . IR GENERIC HISTORICAL  05/04/2016   IR ANGIO VERTEBRAL  SEL SUBCLAVIAN INNOMINATE UNI R MOD SED 05/04/2016 [name], MD [facility]  . IR GENERIC HISTORICAL  06/07/2016   IR RADIOLOGIST EVAL & MGMT 06/07/2016 [facility]  . JOINT REPLACEMENT    . MITRAL VALVE REPAIR    . mvp repair    . RADIOLOGY WITH ANESTHESIA N/A 05/04/2016   Procedure: RADIOLOGY WITH ANESTHESIA;  Surgeon: [name], MD;  Location: [place];  Service: Radiology;  Laterality: N/A;  . TOTAL HIP ARTHROPLASTY      Family History  Problem Relation Age of Onset  . Rheumatic fever Mother   . Coronary artery disease Father     Allergies  Allergen Reactions  . Novocain [Procaine Hcl]     Irregular heart beat     . Penicillins     REACTION: itching    Current Outpatient Prescriptions on File Prior to Visit  Medication Sig Dispense Refill  . doxycycline (VIBRA-TABS) 100 MG tablet TAKE 1 TABLET BY MOUTH TWICE DAILY 180 tablet 0   No current facility-administered medications on file prior to visit.     BP 122/78 (BP Location: Left Arm, Patient Position: Sitting, Cuff Size: Normal)   Pulse 70   Temp 98.3 F (36.8 C) (Oral)   Ht 5\' 4"  (1.626 m)   Wt 150 lb (68 kg)   SpO2 97%   BMI 25.75 kg/m  Review of Systems  Constitutional: Positive for activity change.  Musculoskeletal: Positive for arthralgias.       Left shoulder and left knee pain  Neurological: Positive for weakness.       Objective:   Physical Exam  Constitutional: He appears well-developed and well-nourished. No distress.   Weak alert Blood pressure well controlled  Musculoskeletal:  Hypertrophic changes involving both knees but no acute inflammatory changes          Assessment & Plan:   Advanced osteoarthritis, left shoulder, left knee. Medical options discussed and significant drug drug interactions with anti-inflammatory medications, tramadol and hydrocodone. We'll continue Tylenol as needed and orthopedic follow-up He will consider a left knee  brace  Nyoka Cowden

## 2017-01-17 ENCOUNTER — Other Ambulatory Visit: Payer: Self-pay | Admitting: Internal Medicine

## 2017-01-18 ENCOUNTER — Telehealth: Payer: Self-pay | Admitting: Internal Medicine

## 2017-01-18 NOTE — Telephone Encounter (Signed)
Pts daughter [name] not on [place]) was calling to check on pts medication per her father these are his concerns for the following Rx's Pravastatin (has never been on this before) sertraline (why would he be on two antidepressants).

## 2017-01-18 NOTE — Telephone Encounter (Signed)
Please see message below, please advise 

## 2017-01-19 NOTE — Telephone Encounter (Signed)
Sertraline.  Is the patient's only antidepressant He does take alprazolam as needed for anxiety or sleep All stroke patients are on statin therapy such as pravastatin

## 2017-01-22 ENCOUNTER — Encounter: Payer: Self-pay | Admitting: Family Medicine

## 2017-01-23 ENCOUNTER — Encounter: Payer: Self-pay | Admitting: Neurology

## 2017-01-23 ENCOUNTER — Ambulatory Visit (INDEPENDENT_AMBULATORY_CARE_PROVIDER_SITE_OTHER): Payer: Medicare Other | Admitting: Neurology

## 2017-01-23 VITALS — BP 121/70 | HR 62 | Wt 141.0 lb

## 2017-01-23 DIAGNOSIS — I2510 Atherosclerotic heart disease of native coronary artery without angina pectoris: Secondary | ICD-10-CM

## 2017-01-23 DIAGNOSIS — I69354 Hemiplegia and hemiparesis following cerebral infarction affecting left non-dominant side: Secondary | ICD-10-CM

## 2017-01-23 NOTE — Progress Notes (Signed)
[facility] [address] [place]. [place] [address] [phone]       OFFICE FOLLOW-UP NOTE  Brian. Brian [name] Date of Birth:  [birth year] Medical Record Number:  [id number]   HPI: Initial visit 07/25/16 ;Brian [name] is a pleasant elderly Caucasian male seen today for the first office follow-up visit following hospital admission for stroke in September 2017. He is accompanied by his daughter and caregiver. Brian Razon Gordonis an 83 y.o.malewith past medical history of TIA, hypertension, atrial flutter. Patient was brought to the emergency department as code stroke after he was found down in the kitchen with left-sided hemiparesis. Patient's last known normal was 6:15 PM the prior night. Per son as 6:15 this morning patient called out to him and he was found on the floor not moving his left side. Patient was brought in for immediate CT scan which showed a right hyperdense MCA. At that time patient was considered to be a possible wake up stroke. Patient was brought for immediate CT angiogram and perfusion. CT perfusion showed significant amount of viable tissue. For this reason he was brought to intervention radiology. 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 Cineangiogram showed thrombus in the right carotid terminus. Patient was kept in the intensive care and and blood pressure was tightly controlled. He was extubated and did well. MRI scan and subsequent to confirm an acute infarct involving right caudate head and slight petechial hemorrhage in the right basal ganglia. Transthoracic echo showed normal ejection fraction. Hemoglobin A1c was 5.3. LDL  cholesterol 62 mg percent. He was seen by physical ,occupational  And speech therapy. He had persistent atrial fibrillation and his INR was optimal 2.8 on admission hence he was switched to eliquis for anticoagulation. Patient was discharged home with home physical and occupation therapy. He is at home and still getting therapy. His able to walk with a walker himself but yet his balance remains poor. The patient is not able to sleep well at night mostly because of his bladder urgency and frequency of urination. Patient has a 24-hour caregiver at home. He feels physically his left-sided strength has improved but his balance is poor. He plans to dental cleanings soon. His starting eliquis well without bleeding or bruising. Patient and daughter are interested in outpatient therapy as well. Update 01/23/2017: He returns for follow-up after last visit 6 months ago. He is accompanied by his caregiver. Patient is living alone but his son spends the night with him. He does have a CNA for 8 hours during the day every day. He states his left knee pain and arthritis is limiting his ability to walk. He walks very little. He did see orthopedic doctor who stated that he was not a candidate for surgery. He was prescribed pain medication but patient chose not to take them. His remains on eliquis which is tolerating well without bleeding or bruising. He states his blood pressure is well controlled and today it is 121/70. Is tolerating his Pravachol well without muscle aches or pains in his last lipid profile checked for satisfactory. He has no new neurological complaints. ROS:   14 system review of systems is positive for  headache, weakness, anxiety, nervousness, walking difficulty, aching muscles, back  pain, joint pain and swelling, frequent waking, daytime sleepiness and all other systems negative  PMH:  Past Medical History:  Diagnosis Date  . Anemia   . Atrial flutter ([place])   . BPH (benign prostatic hypertrophy)   .  Epistaxis   . Hemorrhage of gastrointestinal tract, unspecified   . History of open heart surgery   . Hypertension   . Osteoarthrosis, unspecified whether generalized or localized, unspecified site   . Personal history of venous thrombosis and embolism   . Rosacea   . Stroke ([place])   . TIA (transient ischemic attack)     Social History:  Social History   Social History  . Marital status: Married    Spouse name: N/A  . Number of children: N/A  . Years of education: N/A   Occupational History  . Not on file.   Social History Main Topics  . Smoking status: Former Research scientist (life sciences)  . Smokeless tobacco: Never Used     Comment: quit 50 yr ago  . Alcohol use No  . Drug use: No  . Sexual activity: Not on file   Other Topics Concern  . Not on file   Social History Narrative  . No narrative on file    Medications:   Current Outpatient Prescriptions on File Prior to Visit  Medication Sig Dispense Refill  . ALPRAZolam (XANAX) 0.25 MG tablet Take 1 tablet (0.25 mg total) by mouth at bedtime. 60 tablet 0  . apixaban (ELIQUIS) 2.5 MG TABS tablet Take 1 tablet (2.5 mg total) by mouth 2 (two) times daily. 60 tablet 0  . clindamycin (CLEOCIN) 150 MG capsule TAKE 2 CAPSULES BY MOUTH PRIOR TO DENTAL WORK AS DIRECTED 2 capsule 2  . doxycycline (VIBRA-TABS) 100 MG tablet TAKE 1 TABLET BY MOUTH TWICE DAILY 180 tablet 0  . finasteride (PROSCAR) 5 MG tablet Take 1 tablet (5 mg total) by mouth daily. 90 tablet 3  . hydrocortisone-pramoxine (ANALPRAM-HC) 2.5-1 % rectal cream Place 1 application rectally 3 (three) times daily. 30 g 0  . metoprolol succinate (TOPROL-XL) 25 MG 24 hr tablet Take 1 tablet (25 mg total) by mouth daily. 270 tablet 0  . pantoprazole (PROTONIX) 40 MG tablet Take 1 tablet (40 mg total) by mouth daily. 90 tablet 3  . pravastatin (PRAVACHOL) 20 MG tablet Take 1 tablet (20 mg total) by mouth daily. 90 tablet 3  . sertraline (ZOLOFT) 25 MG tablet Take 1 tablet (25 mg total) by mouth  daily. 30 tablet 5   No current facility-administered medications on file prior to visit.     Allergies:   Allergies  Allergen Reactions  . Novocain [Procaine Hcl]     Irregular heart beat     . Penicillins     REACTION: itching    Physical Exam General: Frail cachectic elderly Caucasian male, seated, in no evident distress Head: head normocephalic and atraumatic.  Neck: supple with no carotid or supraclavicular bruits Cardiovascular: irregular rate and rhythm, no murmurs Musculoskeletal: no deformity Except left knee swollen and painful with restriction of range of motion Skin:  no rash/petichiae Vascular:  Normal pulses all extremities Vitals:   01/23/17 1354  BP: 121/70  Pulse: 62   Neurologic Exam Mental Status: Awake and fully alert. Oriented to place and time. Recent and remote memory intact. Attention span, concentration and fund of knowledge appropriate. Mood and affect appropriate.  Cranial Nerves: Fundoscopic exam Not done Pupils equal, briskly reactive to light. Extraocular movements full without nystagmus. Visual fields  full to confrontation. Hearing intact. Facial sensation intact. Mild left facial asymmetry on smiling only. Tongue, palate moves normally and symmetrically.  Motor: Mild left hemiparesis 4/5 strength with weakness of left grip and intrinsic hand muscles. Orbits right over left upper extremity. Mild weakness of left hip flexors and ankle dorsiflexors. No upper or lower extremity drift. Sensory.: intact to touch ,pinprick .position and vibratory sensation.  Coordination: Rapid alternating movements normal in all extremities. Finger-to-nose and heel-to-shin performed accurately bilaterally. Gait and Station: Deferred as patient did not bring his walker. Reflexes: 1+ and symmetric. Toes downgoing.       ASSESSMENT: 16 year Caucasian male with embolic right basal ganglia infarct in September 2017 secondary to atrial fibrillation due to right middle  cerebral artery occlusion treated with mechanical embolectomy with complete recanalization with excellent clinical outcome. He still has mild left hemiparesis and gait and balance difficulties    PLAN: I had a long d/w patient and his caregiver about his remote stroke, residual left hemiparesis,risk for recurrent stroke/TIAs, personally independently reviewed imaging studies and stroke evaluation results and answered questions.Continue Eliquis (apixaban) daily  for secondary stroke prevention for atrial fibrillation and maintain strict control of hypertension with blood pressure goal below 130/90, diabetes with hemoglobin A1c goal below 6.5% and lipids with LDL cholesterol goal below 70 mg/dL. I also advised the patient to eat a healthy diet with plenty of whole grains, cereals, fruits and vegetables, exercise regularly and maintain ideal body weight .I encouraged patient to be more active and to walk with assistance. I advised him to try medications for his knee pain in order to help him be pain-free so he could walk. Followup in the future with me in one year or call earlier if necessary Greater than 50% of time during this 25 minute visit was spent on counseling,explanation of diagnosis, planning of further management, discussion with patient and family and coordination of care Antony Contras, MD  [facility] [address] [place], [place] [address]  Phone [phone] Fax [phone] Note: This document was prepared with digital dictation and possible smart phrase technology. Any transcriptional errors that result from this process are unintentional

## 2017-01-23 NOTE — Patient Instructions (Signed)
I had a long d/w patient and his caregiver about his remote stroke, residual left hemiparesis,risk for recurrent stroke/TIAs, personally independently reviewed imaging studies and stroke evaluation results and answered questions.Continue Eliquis (apixaban) daily  for secondary stroke prevention for atrial fibrillation and maintain strict control of hypertension with blood pressure goal below 130/90, diabetes with hemoglobin A1c goal below 6.5% and lipids with LDL cholesterol goal below 70 mg/dL. I also advised the patient to eat a healthy diet with plenty of whole grains, cereals, fruits and vegetables, exercise regularly and maintain ideal body weight .I encouraged patient to be more active and to walk with assistance. I advised him to try medications for his knee pain in order to help him be pain-free so he could walk. Followup in the future with me in one year or call earlier if necessary

## 2017-01-24 ENCOUNTER — Other Ambulatory Visit: Payer: Self-pay | Admitting: Internal Medicine

## 2017-01-24 NOTE — Telephone Encounter (Signed)
Spoke with pt's daughter, [name]. She thinks that pharmacy is still filling Lexapro. She is concerned that pt may be taking both Lexapro and Zoloft. She will check with pharmacy and see what has been filled. If any changes need to be made she will call office. Nothing further needed at this time.

## 2017-01-24 NOTE — Telephone Encounter (Signed)
LMTCB for pt's daughter, [name]. Pt gave verbal permission to speak with her.

## 2017-02-08 ENCOUNTER — Telehealth: Payer: Self-pay | Admitting: Internal Medicine

## 2017-02-08 NOTE — Telephone Encounter (Signed)
Pt need a letter to be written to the court system to excuse him from jury duty on [facility] Wednesday Apr 18, 2017 at 8:30 Jury #[id number] Panel # [id number].

## 2017-02-09 NOTE — Telephone Encounter (Signed)
Patient states that they had already attempted to be excused d/t age, but they called an requested a letter from PCP. Is this ok?

## 2017-02-09 NOTE — Telephone Encounter (Signed)
Notify patient that his age alone will  excuse him from jury duty

## 2017-02-09 NOTE — Telephone Encounter (Signed)
Please advise 

## 2017-02-09 NOTE — Telephone Encounter (Signed)
okay

## 2017-02-12 NOTE — Telephone Encounter (Signed)
Spoke with patient's [name], on [place]), advised letter was ready to excuse patient from jury duty; son requests letter be mailed to [place], mailed letter per request.

## 2017-02-20 ENCOUNTER — Other Ambulatory Visit: Payer: Self-pay | Admitting: Internal Medicine

## 2017-03-13 ENCOUNTER — Emergency Department (HOSPITAL_COMMUNITY): Payer: Medicare Other

## 2017-03-13 ENCOUNTER — Inpatient Hospital Stay (HOSPITAL_COMMUNITY)
Admission: EM | Admit: 2017-03-13 | Discharge: 2017-03-15 | DRG: 194 | Disposition: A | Discharge status: Home Health | Payer: Medicare Other | Attending: Internal Medicine | Admitting: Internal Medicine

## 2017-03-13 ENCOUNTER — Encounter (HOSPITAL_COMMUNITY): Payer: Self-pay | Admitting: *Deleted

## 2017-03-13 DIAGNOSIS — J189 Pneumonia, unspecified organism: Secondary | ICD-10-CM | POA: Diagnosis present

## 2017-03-13 DIAGNOSIS — Z993 Dependence on wheelchair: Secondary | ICD-10-CM

## 2017-03-13 DIAGNOSIS — R296 Repeated falls: Secondary | ICD-10-CM | POA: Diagnosis present

## 2017-03-13 DIAGNOSIS — N400 Enlarged prostate without lower urinary tract symptoms: Secondary | ICD-10-CM | POA: Diagnosis present

## 2017-03-13 DIAGNOSIS — I69354 Hemiplegia and hemiparesis following cerebral infarction affecting left non-dominant side: Secondary | ICD-10-CM | POA: Diagnosis not present

## 2017-03-13 DIAGNOSIS — S199XXA Unspecified injury of neck, initial encounter: Secondary | ICD-10-CM | POA: Diagnosis not present

## 2017-03-13 DIAGNOSIS — J181 Lobar pneumonia, unspecified organism: Secondary | ICD-10-CM | POA: Diagnosis not present

## 2017-03-13 DIAGNOSIS — R627 Adult failure to thrive: Secondary | ICD-10-CM | POA: Diagnosis present

## 2017-03-13 DIAGNOSIS — Z96649 Presence of unspecified artificial hip joint: Secondary | ICD-10-CM | POA: Diagnosis present

## 2017-03-13 DIAGNOSIS — W19XXXA Unspecified fall, initial encounter: Secondary | ICD-10-CM | POA: Diagnosis not present

## 2017-03-13 DIAGNOSIS — Z8711 Personal history of peptic ulcer disease: Secondary | ICD-10-CM

## 2017-03-13 DIAGNOSIS — I480 Paroxysmal atrial fibrillation: Secondary | ICD-10-CM | POA: Diagnosis present

## 2017-03-13 DIAGNOSIS — F419 Anxiety disorder, unspecified: Secondary | ICD-10-CM | POA: Diagnosis present

## 2017-03-13 DIAGNOSIS — I10 Essential (primary) hypertension: Secondary | ICD-10-CM | POA: Diagnosis present

## 2017-03-13 DIAGNOSIS — I493 Ventricular premature depolarization: Secondary | ICD-10-CM | POA: Diagnosis present

## 2017-03-13 DIAGNOSIS — G4489 Other headache syndrome: Secondary | ICD-10-CM | POA: Diagnosis not present

## 2017-03-13 DIAGNOSIS — M6281 Muscle weakness (generalized): Secondary | ICD-10-CM | POA: Diagnosis not present

## 2017-03-13 DIAGNOSIS — I4892 Unspecified atrial flutter: Secondary | ICD-10-CM | POA: Diagnosis present

## 2017-03-13 DIAGNOSIS — Z7901 Long term (current) use of anticoagulants: Secondary | ICD-10-CM

## 2017-03-13 DIAGNOSIS — Z79899 Other long term (current) drug therapy: Secondary | ICD-10-CM | POA: Diagnosis not present

## 2017-03-13 DIAGNOSIS — N179 Acute kidney failure, unspecified: Secondary | ICD-10-CM | POA: Diagnosis not present

## 2017-03-13 DIAGNOSIS — J90 Pleural effusion, not elsewhere classified: Secondary | ICD-10-CM | POA: Diagnosis present

## 2017-03-13 DIAGNOSIS — S0990XA Unspecified injury of head, initial encounter: Secondary | ICD-10-CM | POA: Diagnosis not present

## 2017-03-13 DIAGNOSIS — Z87891 Personal history of nicotine dependence: Secondary | ICD-10-CM

## 2017-03-13 DIAGNOSIS — E871 Hypo-osmolality and hyponatremia: Secondary | ICD-10-CM | POA: Diagnosis present

## 2017-03-13 DIAGNOSIS — S299XXA Unspecified injury of thorax, initial encounter: Secondary | ICD-10-CM | POA: Diagnosis not present

## 2017-03-13 LAB — URINALYSIS, MICROSCOPIC (REFLEX)

## 2017-03-13 LAB — CBC
HEMATOCRIT: 42.9 % (ref 39.0–52.0)
HEMOGLOBIN: 14.5 g/dL (ref 13.0–17.0)
MCH: 31 pg (ref 26.0–34.0)
MCHC: 33.8 g/dL (ref 30.0–36.0)
MCV: 91.7 fL (ref 78.0–100.0)
Platelets: 194 10*3/uL (ref 150–400)
RBC: 4.68 MIL/uL (ref 4.22–5.81)
RDW: 14.4 % (ref 11.5–15.5)
WBC: 14.8 10*3/uL — AB (ref 4.0–10.5)

## 2017-03-13 LAB — BASIC METABOLIC PANEL
ANION GAP: 8 (ref 5–15)
BUN: 17 mg/dL (ref 6–20)
CHLORIDE: 100 mmol/L — AB (ref 101–111)
CO2: 23 mmol/L (ref 22–32)
Calcium: 9.2 mg/dL (ref 8.9–10.3)
Creatinine, Ser: 0.64 mg/dL (ref 0.61–1.24)
GFR calc non Af Amer: >60 mL/min (ref >60)
Glucose, Bld: 114 mg/dL — ABNORMAL HIGH (ref 65–99)
POTASSIUM: 4 mmol/L (ref 3.5–5.1)
SODIUM: 131 mmol/L — AB (ref 135–145)

## 2017-03-13 LAB — URINALYSIS, ROUTINE W REFLEX MICROSCOPIC
Bilirubin Urine: NEGATIVE
GLUCOSE, UA: NEGATIVE mg/dL
Ketones, ur: 15 mg/dL — AB
LEUKOCYTES UA: NEGATIVE
NITRITE: NEGATIVE
PH: 6 (ref 5.0–8.0)
Protein, ur: NEGATIVE mg/dL
SPECIFIC GRAVITY, URINE: 1.02 (ref 1.005–1.030)

## 2017-03-13 MED ORDER — ACETAMINOPHEN 325 MG PO TABS
650.0000 mg | ORAL_TABLET | Freq: Four times a day (QID) | ORAL | Status: DC | PRN
  Administered 2017-03-14 – 2017-03-15 (×2): 650 mg via ORAL
  Filled 2017-03-13 (×2): qty 2

## 2017-03-13 MED ORDER — ALPRAZOLAM 0.25 MG PO TABS
0.2500 mg | ORAL_TABLET | Freq: Every day | ORAL | Status: DC
  Administered 2017-03-13 – 2017-03-14 (×2): 0.25 mg via ORAL
  Filled 2017-03-13 (×2): qty 1

## 2017-03-13 MED ORDER — SODIUM CHLORIDE 0.9 % IV BOLUS (SEPSIS)
1000.0000 mL | Freq: Once | INTRAVENOUS | Status: AC
  Administered 2017-03-13: 1000 mL via INTRAVENOUS

## 2017-03-13 MED ORDER — METOPROLOL SUCCINATE ER 25 MG PO TB24: 25.0000 mg | ORAL_TABLET | ORAL | Status: DC

## 2017-03-13 MED ORDER — DEXTROSE 5 % IV SOLN
500.0000 mg | INTRAVENOUS | Status: DC
  Administered 2017-03-14 – 2017-03-15 (×2): 500 mg via INTRAVENOUS
  Filled 2017-03-13 (×2): qty 500

## 2017-03-13 MED ORDER — DEXTROSE 5 % IV SOLN
1.0000 g | Freq: Once | INTRAVENOUS | Status: AC
  Administered 2017-03-13: 1 g via INTRAVENOUS
  Filled 2017-03-13: qty 10

## 2017-03-13 MED ORDER — AZITHROMYCIN 500 MG IV SOLR
500.0000 mg | Freq: Once | INTRAVENOUS | Status: AC
  Administered 2017-03-13: 500 mg via INTRAVENOUS
  Filled 2017-03-13: qty 500

## 2017-03-13 MED ORDER — METOPROLOL SUCCINATE ER 50 MG PO TB24
50.0000 mg | ORAL_TABLET | Freq: Every day | ORAL | Status: DC
  Administered 2017-03-13 – 2017-03-14 (×2): 50 mg via ORAL
  Filled 2017-03-13 (×2): qty 1

## 2017-03-13 MED ORDER — DEXTROSE 5 % IV SOLN
2.0000 g | INTRAVENOUS | Status: DC
  Administered 2017-03-14 – 2017-03-15 (×2): 2 g via INTRAVENOUS
  Filled 2017-03-13 (×2): qty 2

## 2017-03-13 MED ORDER — APIXABAN 2.5 MG PO TABS
2.5000 mg | ORAL_TABLET | Freq: Two times a day (BID) | ORAL | Status: DC
  Administered 2017-03-13 – 2017-03-15 (×4): 2.5 mg via ORAL
  Filled 2017-03-13 (×4): qty 1

## 2017-03-13 MED ORDER — SERTRALINE HCL 50 MG PO TABS
25.0000 mg | ORAL_TABLET | Freq: Every day | ORAL | Status: DC
  Administered 2017-03-14 – 2017-03-15 (×2): 25 mg via ORAL
  Filled 2017-03-13 (×2): qty 1

## 2017-03-13 MED ORDER — SODIUM CHLORIDE 0.9 % IV SOLN
INTRAVENOUS | Status: DC
  Administered 2017-03-13: 23:00:00 via INTRAVENOUS

## 2017-03-13 MED ORDER — PANTOPRAZOLE SODIUM 40 MG PO TBEC
40.0000 mg | DELAYED_RELEASE_TABLET | Freq: Every day | ORAL | Status: DC
  Administered 2017-03-14 – 2017-03-15 (×2): 40 mg via ORAL
  Filled 2017-03-13 (×2): qty 1

## 2017-03-13 MED ORDER — ACETAMINOPHEN 500 MG PO TABS
1000.0000 mg | ORAL_TABLET | Freq: Once | ORAL | Status: AC
  Administered 2017-03-13: 1000 mg via ORAL
  Filled 2017-03-13: qty 2

## 2017-03-13 MED ORDER — FINASTERIDE 5 MG PO TABS
5.0000 mg | ORAL_TABLET | Freq: Every day | ORAL | Status: DC
  Administered 2017-03-14 – 2017-03-15 (×2): 5 mg via ORAL
  Filled 2017-03-13 (×2): qty 1

## 2017-03-13 MED ORDER — METOPROLOL SUCCINATE ER 25 MG PO TB24
25.0000 mg | ORAL_TABLET | Freq: Every morning | ORAL | Status: DC
  Administered 2017-03-14 – 2017-03-15 (×2): 25 mg via ORAL
  Filled 2017-03-13 (×2): qty 1

## 2017-03-13 NOTE — Discharge Instructions (Signed)
The CT scan of the head and neck does not show serious injury. Please follow-up closely with your primary care doctor.  Please return without fail for worsening symptoms, including fever, confusion, new vision/speech changes, numbness/weakness or any other symptoms concerning to you.

## 2017-03-13 NOTE — ED Notes (Signed)
To x-ray

## 2017-03-13 NOTE — ED Provider Notes (Addendum)
[facility] DEPT Provider Note   CSN: [id number] Arrival date & time: 03/13/17  1033     History   Chief Complaint Chief Complaint  Patient presents with  . Fall    HPI Brian [name] is a 83 y.o. male.  The history is provided by the patient.  Fall  This is a new problem. The current episode started 1 to 2 hours ago. The problem occurs rarely. The problem has been resolved. Pertinent negatives include no chest pain, no abdominal pain and no shortness of breath. Nothing aggravates the symptoms. Nothing relieves the symptoms. He has tried nothing for the symptoms.     83 year old male who presents after mechanical fall. Patient has a history of atrial fibrillation on Eliquis, previous stroke with residual left-sided weakness, hypertension. States that he tripped in the bathroom, and fell backwards hitting the back of his head. He did not have any loss of consciousness. His son called EMS, and on their arrival they were able to assist him and brought him to the ED for evaluation. He reports chronic left shoulder and left knee pain due to arthritis. Complains of mild headache. Denies any new numbness or weakness.  Past Medical History:  Diagnosis Date  . Anemia   . Atrial flutter ([place])   . BPH (benign prostatic hypertrophy)   . Epistaxis   . Hemorrhage of gastrointestinal tract, unspecified   . History of open heart surgery   . Hypertension   . Osteoarthrosis, unspecified whether generalized or localized, unspecified site   . Personal history of venous thrombosis and embolism   . Rosacea   . Stroke ([place])   . TIA (transient ischemic attack)     Patient Active Problem List   Diagnosis Date Noted  . Cerebrovascular accident (CVA) due to thrombosis of right middle cerebral artery ([place])   . Coronary artery disease involving native coronary artery of native heart without angina pectoris   . H/O mitral valve repair   . Paroxysmal atrial fibrillation (H[facility])   . Dysarthria,  post-stroke   . Dysphagia, post-stroke   . Cerebrovascular accident (CVA) due to thrombosis of precerebral artery ([place])   . Scoliosis (and kyphoscoliosis), idiopathic 01/26/2014  . ROTATOR CUFF SYNDROME, LEFT 03/07/2010  . OTH MALIG NEOPLASM SKIN OTH&UNSPEC PARTS FACE 12/06/2009  . ACTINIC KERATOSIS, HEAD 09/06/2009  . Osteoarthritis 02/20/2009  . MITRAL VALVE REPLACEMENT, HX OF 02/20/2009  . HIP REPLACEMENT, TOTAL, HX OF 02/20/2009  . APPENDECTOMY, HX OF 02/20/2009  . HERNIORRHAPHY, HX OF 02/20/2009  . EPISTAXIS, RECURRENT 11/19/2008  . UNS ADVRS EFF UNS RX MEDICINAL&BIOLOGICAL SBSTNC 06/12/2008  . OTHER AND UNSPECIFIED MITRAL VALVE DISEASES 03/12/2008  . ATRIAL FLUTTER 03/12/2008  . Essential hypertension 03/06/2007  . PEPTIC ULCER DISEASE 03/06/2007  . BENIGN PROSTATIC HYPERTROPHY 03/06/2007  . Coronary atherosclerosis 02/13/2007  . ROSACEA 02/13/2007  . DVT, HX OF 02/13/2007    Past Surgical History:  Procedure Laterality Date  . APPENDECTOMY    . HERNIA REPAIR    . IR GENERIC HISTORICAL  05/04/2016   IR PERCUTANEOUS ART THROMBECTOMY/INFUSION INTRACRANIAL INC DIAG ANGIO 05/04/2016 [name], MD [facility]  . IR GENERIC HISTORICAL  05/04/2016   IR ANGIO VERTEBRAL SEL SUBCLAVIAN INNOMINATE UNI R MOD SED 05/04/2016 [name], MD [facility]  . IR GENERIC HISTORICAL  06/07/2016   IR RADIOLOGIST EVAL & MGMT 06/07/2016 [facility]  . JOINT REPLACEMENT    . MITRAL VALVE REPAIR    . mvp repair    . RADIOLOGY  WITH ANESTHESIA N/A 05/04/2016   Procedure: RADIOLOGY WITH ANESTHESIA;  Surgeon: [name], MD;  Location: [facility];  Service: Radiology;  Laterality: N/A;  . TOTAL HIP ARTHROPLASTY         Home Medications    Prior to Admission medications   Medication Sig Start Date End Date Taking? Authorizing Provider  acetaminophen (TYLENOL) 325 MG tablet Take 650 mg by mouth 3 (three) times daily.   Yes [provider]  ALPRAZolam (XANAX) 0.25 MG  tablet Take 1 tablet (0.25 mg total) by mouth at bedtime. 01/16/17  Yes Marletta Lor, MD  apixaban (ELIQUIS) 2.5 MG TABS tablet Take 1 tablet (2.5 mg total) by mouth 2 (two) times daily. 01/16/17  Yes Marletta Lor, MD  clindamycin (CLEOCIN) 150 MG capsule TAKE 2 CAPSULES BY MOUTH PRIOR TO DENTAL WORK AS DIRECTED 01/16/17  Yes Marletta Lor, MD  doxycycline (VIBRA-TABS) 100 MG tablet TAKE 1 TABLET BY MOUTH TWICE DAILY 01/18/17  Yes Marletta Lor, MD  finasteride (PROSCAR) 5 MG tablet Take 1 tablet (5 mg total) by mouth daily. 01/16/17  Yes Marletta Lor, MD  metoprolol succinate (TOPROL-XL) 25 MG 24 hr tablet Take 1 tablet (25 mg total) by mouth daily. Patient taking differently: Take 25-50 mg by mouth See admin instructions. 25mg  in am, 50mg  in pm 01/16/17  Yes Marletta Lor, MD  pantoprazole (PROTONIX) 40 MG tablet Take 1 tablet (40 mg total) by mouth daily. 01/16/17  Yes Marletta Lor, MD  sertraline (ZOLOFT) 25 MG tablet Take 1 tablet (25 mg total) by mouth daily. 01/16/17  Yes Marletta Lor, MD  ELIQUIS 2.5 MG TABS tablet TAKE 1 TABLET BY MOUTH TWICE DAILY Patient not taking: Reported on 03/13/2017 02/20/17   Marletta Lor, MD  hydrocortisone-pramoxine [facility]) 2.5-1 % rectal cream Place 1 application rectally 3 (three) times daily. Patient not taking: Reported on 03/13/2017 01/16/17   Marletta Lor, MD  pravastatin (PRAVACHOL) 20 MG tablet Take 1 tablet (20 mg total) by mouth daily. Patient not taking: Reported on 03/13/2017 01/16/17   Marletta Lor, MD    Family History Family History  Problem Relation Age of Onset  . Rheumatic fever Mother   . Coronary artery disease Father     Social History Social History  Substance Use Topics  . Smoking status: Former Research scientist (life sciences)  . Smokeless tobacco: Never Used     Comment: quit 50 yr ago  . Alcohol use No     Allergies   Novocain [procaine hcl] and Penicillins   Review  of Systems Review of Systems  Respiratory: Negative for shortness of breath.   Cardiovascular: Negative for chest pain.  Gastrointestinal: Negative for abdominal pain.  All other systems reviewed and are negative.    Physical Exam Updated Vital Signs BP (!) 101/48   Pulse 94   Temp 99.2 F (37.3 C) (Oral)   Resp (!) 22   Ht 5\' 5"  (1.651 m)   Wt 63.5 kg (140 lb)   SpO2 100%   BMI 23.30 kg/m   Physical Exam Physical Exam  Nursing note and vitals reviewed. Constitutional: Well developed, elderly man, non-toxic, and in no acute distress Head: Normocephalic and atraumatic.  Mouth/Throat: Oropharynx is clear and moist.  Neck: Normal range of motion. Neck supple. no cervical spine tenderness Cardiovascular: Normal rate and regular rhythm.   Pulmonary/Chest: Effort normal and breath sounds normal.  Abdominal: Soft. There is no tenderness. There is no rebound and no  guarding.  Musculoskeletal: Normal range of motion.  Skin: Skin is warm and dry.  Psychiatric: Cooperative Neurological:  Alert, oriented to person, place, time, and situation. Memory grossly in tact. Fluent speech. No dysarthria or aphasia.  Cranial nerves: EOMI without nystagmus. No gaze deviation. Facial muscles symmetric with activation. Sensation to light touch over face in tact bilaterally. Hearing grossly in tact. Palate elevates symmetrically. Head turn and shoulder shrug are intact. Tongue midline.  Reflexes defered.  Muscle bulk and tone normal. Residual left arm and left leg weakness against gravity. Full strength in RUE and RLE.  Sensation to light touch is in tact throughout in bilateral upper and lower extremities.    ED Treatments / Results  Labs (all labs ordered are listed, but only abnormal results are displayed) Labs Reviewed  BASIC METABOLIC PANEL - Abnormal; Notable for the following:       Result Value   Sodium 131 (*)    Chloride 100 (*)    Glucose, Bld 114 (*)    All other components  within normal limits  CBC - Abnormal; Notable for the following:    WBC 14.8 (*)    All other components within normal limits  URINALYSIS, ROUTINE W REFLEX MICROSCOPIC - Abnormal; Notable for the following:    Hgb urine dipstick MODERATE (*)    Ketones, ur 15 (*)    All other components within normal limits  URINALYSIS, MICROSCOPIC (REFLEX) - Abnormal; Notable for the following:    Bacteria, UA RARE (*)    Squamous Epithelial / LPF 0-5 (*)    All other components within normal limits  CBG MONITORING, ED    EKG  EKG Interpretation  Date/Time:  Tuesday March 13 2017 11:41:08 EDT Ventricular Rate:  111 PR Interval:    QRS Duration: 120 QT Interval:  344 QTC Calculation: 468 R Axis:   -60 Text Interpretation:  Sinus tachycardia Multiple ventricular premature complexes Right atrial enlargement Left anterior fascicular block LVH with secondary repolarization abnormality aside from PVCs and tachycardia  no significant change Confirmed by Brantley Stage [phone]) on 03/13/2017 11:46:42 AM       Radiology Ct Head Wo Contrast  Result Date: 03/13/2017 CLINICAL DATA:  Fall, hit head.  On blood thinner. EXAM: CT HEAD WITHOUT CONTRAST CT CERVICAL SPINE WITHOUT CONTRAST TECHNIQUE: Multidetector CT imaging of the head and cervical spine was performed following the standard protocol without intravenous contrast. Multiplanar CT image reconstructions of the cervical spine were also generated. COMPARISON:  05/10/2016 FINDINGS: CT HEAD FINDINGS Brain: Severe diffuse cerebral atrophy and chronic microvascular disease. Old right basal ganglia lacunar infarcts. Ex vacuo dilatation of the lateral ventricles. No acute intracranial abnormality. Specifically, no hemorrhage, hydrocephalus, mass lesion, acute infarction, or significant intracranial injury. Vascular: No hyperdense vessel or unexpected calcification. Skull: No acute calvarial abnormality. Sinuses/Orbits: Mucosal thickening throughout the paranasal sinuses.  Air-fluid level in the left maxillary sinus. Other: None CT CERVICAL SPINE FINDINGS Alignment: Normal Skull base and vertebrae: No fracture. Hemangioma in the T3 vertebral body. Soft tissues and spinal canal: Prevertebral soft tissues are normal. No epidural or paraspinal hematoma. Disc levels: Diffuse severe degenerative disc disease changes with disc space narrowing and spurring. Mild degenerative facet disease bilaterally. Upper chest: No acute findings. Other: Negative IMPRESSION: Severe diffuse atrophy and chronic small vessel disease. Old right basal ganglia infarcts. No acute intracranial abnormality. Acute on chronic sinusitis. Cervical spondylosis.  No acute bony abnormality. Electronically Signed   By: Rolm Baptise M.D.   On: 03/13/2017  12:00   Ct Cervical Spine Wo Contrast  Result Date: 03/13/2017 CLINICAL DATA:  Fall, hit head.  On blood thinner. EXAM: CT HEAD WITHOUT CONTRAST CT CERVICAL SPINE WITHOUT CONTRAST TECHNIQUE: Multidetector CT imaging of the head and cervical spine was performed following the standard protocol without intravenous contrast. Multiplanar CT image reconstructions of the cervical spine were also generated. COMPARISON:  05/10/2016 FINDINGS: CT HEAD FINDINGS Brain: Severe diffuse cerebral atrophy and chronic microvascular disease. Old right basal ganglia lacunar infarcts. Ex vacuo dilatation of the lateral ventricles. No acute intracranial abnormality. Specifically, no hemorrhage, hydrocephalus, mass lesion, acute infarction, or significant intracranial injury. Vascular: No hyperdense vessel or unexpected calcification. Skull: No acute calvarial abnormality. Sinuses/Orbits: Mucosal thickening throughout the paranasal sinuses. Air-fluid level in the left maxillary sinus. Other: None CT CERVICAL SPINE FINDINGS Alignment: Normal Skull base and vertebrae: No fracture. Hemangioma in the T3 vertebral body. Soft tissues and spinal canal: Prevertebral soft tissues are normal. No  epidural or paraspinal hematoma. Disc levels: Diffuse severe degenerative disc disease changes with disc space narrowing and spurring. Mild degenerative facet disease bilaterally. Upper chest: No acute findings. Other: Negative IMPRESSION: Severe diffuse atrophy and chronic small vessel disease. Old right basal ganglia infarcts. No acute intracranial abnormality. Acute on chronic sinusitis. Cervical spondylosis.  No acute bony abnormality. Electronically Signed   By: Rolm Baptise M.D.   On: 03/13/2017 12:00    Procedures Procedures (including critical care time)  Medications Ordered in ED Medications - No data to display   Initial Impression / Assessment and Plan / ED Course  I have reviewed the triage vital signs and the nursing notes.  Pertinent labs & imaging results that were available during my care of the patient were reviewed by me and considered in my medical decision making (see chart for details).    83 year old male, anticoagulated for atrial fibrillation, who presents with fall. He is well-appearing, he does not have any focal neurological deficits aside from baseline left-sided weakness from his previous stroke. He has normal mentation per family. A CT head and cervical spine obtained, visualized, does not show any acute intracranial processes. UA does not show infection. He continues to feel at baseline. States that normally he is primarily wheelchair bound and Does not often walk.   On repeat evaluation, he does have fever of 101.3. No other symptoms of localizing infection. The did obtain a chest x-ray that showed possible left lower lobe pneumonia. He does have a leukocytosis of 14. We'll treat for community-acquired pneumonia with ceftriaxone and azithromycin. Plan to admit for observation given high PORT score.   Final Clinical Impressions(s) / ED Diagnoses   Final diagnoses:  Fall, initial encounter    New Prescriptions New Prescriptions   No medications on file       Forde Dandy, MD 03/13/17 1458    Forde Dandy, MD 03/13/17 1706    Forde Dandy, MD 03/13/17 [phone]

## 2017-03-13 NOTE — ED Notes (Signed)
Temp recheck is 100.6 oral . Dr. Viviana Simpler informed.

## 2017-03-13 NOTE — ED Triage Notes (Signed)
Pt in from home via [facility], pt reported to have 2 falls today, pts son called EMS for first and told them not to come, pt had witnessed second fall hitting the back of the head & EMS was called to come for eval, pt takes Eliquis & ASA, pt A&O x4, hx of CVA 10 mths ago, pt c/o HA 5/10, pt c/o  Chronic arthritic pain in L shoulder, pt MAE

## 2017-03-13 NOTE — ED Notes (Signed)
Son states pt is requesting tylenol. Will inform MD.

## 2017-03-13 NOTE — Progress Notes (Signed)
Pt came from home via [facility], pt reported to have  fallen today, pts son called EMS after witnessing a second fall pt hitting the back of the head. Pt. Son recognized me from his fathers last visit to ED several month ago.He asked for spiritual support, prayer and that I would contact his Rabbi. Call was made to [facility] and patient and son were notified. Jaclynn Major, [place], [facility], Pager [phone]

## 2017-03-13 NOTE — H&P (Signed)
History and Physical    Brian [name] BUL:[id number] DOB: [birth year] DOA: 03/13/2017  PCP: Marletta Lor, MD  Patient coming from: Home  I have personally briefly reviewed patient's old medical records in [place]  Chief Complaint: fall at home.   HPI: Brian [name] is a 83 y.o. male with medical history significant of atrial fibrillation on eliquis, BPH, Hypertension. H/o stroke, anemia, PUD, brought in by family for recurrent falls today. Pt denies losing consciousness. He reports he tripped and fell. Family at the bedside is not sure about the falls. Patient is alert and appears oriented. On arrival to ED, he underwent CT head and cervical spine, which were negative for acute pathology. He was also found to be febrile in ED, further eval with a CXR showed small left pleural effusion and possible superimposed pneumonia. He denies any sob, cough or chest pain. He denies any weakness or tingling or numbness of the legs or arms. Further lab work revealed mild hyponatremia and leukocytosis. He denies any dizziness or blurry vision. He denies nausea, vomiting, diarrhea, abdominal pain.  He was referred to medical service for evaluation and management of community acquired pneumonia.    Review of Systems: As per HPI otherwise 10 point review of systems negative.    Past Medical History:  Diagnosis Date  . Anemia   . Atrial flutter ([place])   . BPH (benign prostatic hypertrophy)   . Epistaxis   . Hemorrhage of gastrointestinal tract, unspecified   . History of open heart surgery   . Hypertension   . Osteoarthrosis, unspecified whether generalized or localized, unspecified site   . Personal history of venous thrombosis and embolism   . Rosacea   . Stroke ([place])   . TIA (transient ischemic attack)     Past Surgical History:  Procedure Laterality Date  . APPENDECTOMY    . HERNIA REPAIR    . IR GENERIC HISTORICAL  05/04/2016   IR PERCUTANEOUS ART THROMBECTOMY/INFUSION  INTRACRANIAL INC DIAG ANGIO 05/04/2016 [name], MD [facility]  . IR GENERIC HISTORICAL  05/04/2016   IR ANGIO VERTEBRAL SEL SUBCLAVIAN INNOMINATE UNI R MOD SED 05/04/2016 [name], MD [facility]  . IR GENERIC HISTORICAL  06/07/2016   IR RADIOLOGIST EVAL & MGMT 06/07/2016 [facility]  . JOINT REPLACEMENT    . MITRAL VALVE REPAIR    . mvp repair    . RADIOLOGY WITH ANESTHESIA N/A 05/04/2016   Procedure: RADIOLOGY WITH ANESTHESIA;  Surgeon: [name], MD;  Location: [facility];  Service: Radiology;  Laterality: N/A;  . TOTAL HIP ARTHROPLASTY       reports that he has quit smoking. He has never used smokeless tobacco. He reports that he does not drink alcohol or use drugs.  Allergies  Allergen Reactions  . Novocain [Procaine Hcl]     Irregular heart beat     . Penicillins Itching    Has patient had a PCN reaction causing immediate rash, facial/tongue/throat swelling, SOB or lightheadedness with hypotension: Unk Has patient had a PCN reaction causing severe rash involving mucus membranes or skin necrosis: Unk Has patient had a PCN reaction that required hospitalization: Unk Has patient had a PCN reaction occurring within the last 10 years: No If all of the above answers are "NO", then may proceed with Cephalosporin use.     Family History  Problem Relation Age of Onset  . Rheumatic fever Mother   . Coronary artery disease Father    Reviewed.  Prior to Admission medications   Medication Sig Start Date End Date Taking? Authorizing Provider  acetaminophen (TYLENOL) 325 MG tablet Take 650 mg by mouth 3 (three) times daily.   Yes [provider]  ALPRAZolam (XANAX) 0.25 MG tablet Take 1 tablet (0.25 mg total) by mouth at bedtime. 01/16/17  Yes Marletta Lor, MD  apixaban (ELIQUIS) 2.5 MG TABS tablet Take 1 tablet (2.5 mg total) by mouth 2 (two) times daily. 01/16/17  Yes Marletta Lor, MD  clindamycin (CLEOCIN) 150 MG capsule TAKE 2  CAPSULES BY MOUTH PRIOR TO DENTAL WORK AS DIRECTED 01/16/17  Yes Marletta Lor, MD  doxycycline (VIBRA-TABS) 100 MG tablet TAKE 1 TABLET BY MOUTH TWICE DAILY 01/18/17  Yes Marletta Lor, MD  finasteride (PROSCAR) 5 MG tablet Take 1 tablet (5 mg total) by mouth daily. 01/16/17  Yes Marletta Lor, MD  metoprolol succinate (TOPROL-XL) 25 MG 24 hr tablet Take 1 tablet (25 mg total) by mouth daily. Patient taking differently: Take 25-50 mg by mouth See admin instructions. 25mg  in am, 50mg  in pm 01/16/17  Yes Marletta Lor, MD  pantoprazole (PROTONIX) 40 MG tablet Take 1 tablet (40 mg total) by mouth daily. 01/16/17  Yes Marletta Lor, MD  sertraline (ZOLOFT) 25 MG tablet Take 1 tablet (25 mg total) by mouth daily. 01/16/17  Yes Marletta Lor, MD  ELIQUIS 2.5 MG TABS tablet TAKE 1 TABLET BY MOUTH TWICE DAILY Patient not taking: Reported on 03/13/2017 02/20/17   Marletta Lor, MD  hydrocortisone-pramoxine [facility]) 2.5-1 % rectal cream Place 1 application rectally 3 (three) times daily. Patient not taking: Reported on 03/13/2017 01/16/17   Marletta Lor, MD  pravastatin (PRAVACHOL) 20 MG tablet Take 1 tablet (20 mg total) by mouth daily. Patient not taking: Reported on 03/13/2017 01/16/17   Marletta Lor, MD    Physical Exam: Vitals:   03/13/17 1745 03/13/17 1800 03/13/17 1815 03/13/17 1848  BP:  (!) 104/40 (!) 109/58 (!) 113/57  Pulse:  72 78 (!) 108  Resp:  18 20 18   Temp: 99.9 F (37.7 C)   100.2 F (37.9 C)  TempSrc:    Oral  SpO2:  97% 95% 96%  Weight:    66.6 kg (146 lb 14.4 oz)  Height:    5\' 5"  (1.651 m)    Constitutional: NAD, calm, comfortable Vitals:   03/13/17 1745 03/13/17 1800 03/13/17 1815 03/13/17 1848  BP:  (!) 104/40 (!) 109/58 (!) 113/57  Pulse:  72 78 (!) 108  Resp:  18 20 18   Temp: 99.9 F (37.7 C)   100.2 F (37.9 C)  TempSrc:    Oral  SpO2:  97% 95% 96%  Weight:    66.6 kg (146 lb 14.4 oz)  Height:    5'  5" (1.651 m)   Eyes: PERRL, lids and conjunctivae normal ENMT: Mucous membranes are moist. Posterior pharynx clear of any exudate or lesions.Normal dentition.  Neck: normal, supple, no masses, no thyromegaly Respiratory: clear to auscultation bilaterally, no wheezing, no crackles. Normal respiratory effort. No accessory muscle use.  Cardiovascular: Regular rate and rhythm, no murmurs / rubs / gallops. No extremity edema. 2+ pedal pulses. No carotid bruits.  Abdomen: no tenderness, no masses palpated. No hepatosplenomegaly. Bowel sounds positive.  Musculoskeletal: no clubbing / cyanosis. No joint deformity upper and lower extremities. Good ROM, no contractures. Normal muscle tone.  Skin: no rashes, lesions, ulcers. No induration Neurologic: CN 2-12 grossly intact. Sensation  intact, DTR normal. Strength 5/5 in right lower extremitiy strength is 4/5,. Psychiatric: Normal judgment and insight. Alert and oriented x 3. Normal mood.     Labs on Admission: I have personally reviewed following labs and imaging studies  CBC:  Recent Labs Lab 03/13/17 1145  WBC 14.8*  HGB 14.5  HCT 42.9  MCV 91.7  PLT 256   Basic Metabolic Panel:  Recent Labs Lab 03/13/17 1145  NA 131*  K 4.0  CL 100*  CO2 23  GLUCOSE 114*  BUN 17  CREATININE 0.64  CALCIUM 9.2   GFR: Estimated Creatinine Clearance: 61.9 mL/min (by C-G formula based on SCr of 0.64 mg/dL). Liver Function Tests: No results for input(s): AST, ALT, ALKPHOS, BILITOT, PROT, ALBUMIN in the last 168 hours. No results for input(s): LIPASE, AMYLASE in the last 168 hours. No results for input(s): AMMONIA in the last 168 hours. Coagulation Profile: No results for input(s): INR, PROTIME in the last 168 hours. Cardiac Enzymes: No results for input(s): CKTOTAL, CKMB, CKMBINDEX, TROPONINI in the last 168 hours. BNP (last 3 results) No results for input(s): PROBNP in the last 8760 hours. HbA1C: No results for input(s): HGBA1C in the last 72  hours. CBG: No results for input(s): GLUCAP in the last 168 hours. Lipid Profile: No results for input(s): CHOL, HDL, LDLCALC, TRIG, CHOLHDL, LDLDIRECT in the last 72 hours. Thyroid Function Tests: No results for input(s): TSH, T4TOTAL, FREET4, T3FREE, THYROIDAB in the last 72 hours. Anemia Panel: No results for input(s): VITAMINB12, FOLATE, FERRITIN, TIBC, IRON, RETICCTPCT in the last 72 hours. Urine analysis:    Component Value Date/Time   COLORURINE YELLOW 03/13/2017 [place] 03/13/2017 1255   LABSPEC 1.020 03/13/2017 1255   PHURINE 6.0 03/13/2017 1255   GLUCOSEU NEGATIVE 03/13/2017 1255   HGBUR MODERATE (A) 03/13/2017 1255   BILIRUBINUR NEGATIVE 03/13/2017 1255   BILIRUBINUR neg 07/21/2013 1211   KETONESUR 15 (A) 03/13/2017 1255   PROTEINUR NEGATIVE 03/13/2017 1255   UROBILINOGEN 0.2 07/21/2013 1211   NITRITE NEGATIVE 03/13/2017 [place] 03/13/2017 1255    Radiological Exams on Admission: Dg Chest 2 View  Result Date: 03/13/2017 CLINICAL DATA:  Fall, fever EXAM: CHEST  2 VIEW COMPARISON:  05/06/2016 FINDINGS: Post sternotomy changes. Right lung is clear. Small left pleural effusion. Hazy atelectasis or infiltrate at the left base. Stable cardiomegaly. No pneumothorax. IMPRESSION: 1. Small left pleural effusion and hazy atelectasis or infiltrate at the left base 2. Mild cardiomegaly Electronically Signed   By: Donavan Foil M.D.   On: 03/13/2017 16:10   Ct Head Wo Contrast  Result Date: 03/13/2017 CLINICAL DATA:  Fall, hit head.  On blood thinner. EXAM: CT HEAD WITHOUT CONTRAST CT CERVICAL SPINE WITHOUT CONTRAST TECHNIQUE: Multidetector CT imaging of the head and cervical spine was performed following the standard protocol without intravenous contrast. Multiplanar CT image reconstructions of the cervical spine were also generated. COMPARISON:  05/10/2016 FINDINGS: CT HEAD FINDINGS Brain: Severe diffuse cerebral atrophy and chronic  microvascular disease. Old right basal ganglia lacunar infarcts. Ex vacuo dilatation of the lateral ventricles. No acute intracranial abnormality. Specifically, no hemorrhage, hydrocephalus, mass lesion, acute infarction, or significant intracranial injury. Vascular: No hyperdense vessel or unexpected calcification. Skull: No acute calvarial abnormality. Sinuses/Orbits: Mucosal thickening throughout the paranasal sinuses. Air-fluid level in the left maxillary sinus. Other: None CT CERVICAL SPINE FINDINGS Alignment: Normal Skull base and vertebrae: No fracture. Hemangioma in the T3 vertebral body. Soft tissues and spinal canal: Prevertebral  soft tissues are normal. No epidural or paraspinal hematoma. Disc levels: Diffuse severe degenerative disc disease changes with disc space narrowing and spurring. Mild degenerative facet disease bilaterally. Upper chest: No acute findings. Other: Negative IMPRESSION: Severe diffuse atrophy and chronic small vessel disease. Old right basal ganglia infarcts. No acute intracranial abnormality. Acute on chronic sinusitis. Cervical spondylosis.  No acute bony abnormality. Electronically Signed   By: Rolm Baptise M.D.   On: 03/13/2017 12:00   Ct Cervical Spine Wo Contrast  Result Date: 03/13/2017 CLINICAL DATA:  Fall, hit head.  On blood thinner. EXAM: CT HEAD WITHOUT CONTRAST CT CERVICAL SPINE WITHOUT CONTRAST TECHNIQUE: Multidetector CT imaging of the head and cervical spine was performed following the standard protocol without intravenous contrast. Multiplanar CT image reconstructions of the cervical spine were also generated. COMPARISON:  05/10/2016 FINDINGS: CT HEAD FINDINGS Brain: Severe diffuse cerebral atrophy and chronic microvascular disease. Old right basal ganglia lacunar infarcts. Ex vacuo dilatation of the lateral ventricles. No acute intracranial abnormality. Specifically, no hemorrhage, hydrocephalus, mass lesion, acute infarction, or significant intracranial injury.  Vascular: No hyperdense vessel or unexpected calcification. Skull: No acute calvarial abnormality. Sinuses/Orbits: Mucosal thickening throughout the paranasal sinuses. Air-fluid level in the left maxillary sinus. Other: None CT CERVICAL SPINE FINDINGS Alignment: Normal Skull base and vertebrae: No fracture. Hemangioma in the T3 vertebral body. Soft tissues and spinal canal: Prevertebral soft tissues are normal. No epidural or paraspinal hematoma. Disc levels: Diffuse severe degenerative disc disease changes with disc space narrowing and spurring. Mild degenerative facet disease bilaterally. Upper chest: No acute findings. Other: Negative IMPRESSION: Severe diffuse atrophy and chronic small vessel disease. Old right basal ganglia infarcts. No acute intracranial abnormality. Acute on chronic sinusitis. Cervical spondylosis.  No acute bony abnormality. Electronically Signed   By: Rolm Baptise M.D.   On: 03/13/2017 12:00    EKG: Independently reviewed. Atrial fib rate of 110/min.   Assessment/Plan Active Problems:   CAP (community acquired pneumonia)    Community Acquired pneumonia:  Admitted for IV rocephin and zithromax. Blood cultures not done on admission.  Gentle hydration.  No cough , no sputum production.  [place] oxygen to keep sats greater than 90%.    Atrial fib : rate controlled on eliquis 5 mg BID.    H/o stroke:  Left sided residual weakness. No new symptoms.    Hyponatremia: possibly from dehydration. Gentle hydration. Repeat sodium in am .    Hypertension:  Well controlled.  Resume home meds.   BPH: Resume Proscar.   Fall :  Pt reports mechanical fall, unclear if he had syncope.  Get orthostatic vital signs.  Monitor on telemetry overnight to evaluate for arrhythmias. Get PT evaluation.     DVT prophylaxis: (eliquis) Code Status:  (Full) Family Communication: family at bedside.  Disposition Plan: pending PT evaluation Consults called: none.  Admission status:  inpatient Nanda Qu[place] MD [facility] Pager [phone]  If 7PM-7AM, please contact night-coverage [email or web address] Password [facility]  03/13/2017, 6:58 PM

## 2017-03-13 NOTE — ED Notes (Signed)
Patient transported to X-ray 

## 2017-03-13 NOTE — ED Notes (Signed)
Pt taking po fluids and eating crackers. Tolerates well.

## 2017-03-14 DIAGNOSIS — E871 Hypo-osmolality and hyponatremia: Secondary | ICD-10-CM

## 2017-03-14 DIAGNOSIS — R627 Adult failure to thrive: Secondary | ICD-10-CM

## 2017-03-14 DIAGNOSIS — W19XXXA Unspecified fall, initial encounter: Secondary | ICD-10-CM

## 2017-03-14 DIAGNOSIS — J181 Lobar pneumonia, unspecified organism: Secondary | ICD-10-CM

## 2017-03-14 LAB — RESPIRATORY PANEL BY PCR
ADENOVIRUS-RVPPCR: NOT DETECTED
Bordetella pertussis: NOT DETECTED
CORONAVIRUS 229E-RVPPCR: NOT DETECTED
CORONAVIRUS HKU1-RVPPCR: NOT DETECTED
CORONAVIRUS NL63-RVPPCR: NOT DETECTED
CORONAVIRUS OC43-RVPPCR: NOT DETECTED
Chlamydophila pneumoniae: NOT DETECTED
Influenza A: NOT DETECTED
Influenza B: NOT DETECTED
MYCOPLASMA PNEUMONIAE-RVPPCR: NOT DETECTED
Metapneumovirus: NOT DETECTED
PARAINFLUENZA VIRUS 1-RVPPCR: NOT DETECTED
Parainfluenza Virus 2: NOT DETECTED
Parainfluenza Virus 3: NOT DETECTED
Parainfluenza Virus 4: NOT DETECTED
Respiratory Syncytial Virus: NOT DETECTED
Rhinovirus / Enterovirus: NOT DETECTED

## 2017-03-14 LAB — HIV ANTIBODY (ROUTINE TESTING W REFLEX): HIV Screen 4th Generation wRfx: NONREACTIVE

## 2017-03-14 LAB — MRSA PCR SCREENING: MRSA by PCR: NEGATIVE

## 2017-03-14 LAB — STREP PNEUMONIAE URINARY ANTIGEN: Strep Pneumo Urinary Antigen: NEGATIVE

## 2017-03-14 NOTE — Progress Notes (Signed)
PROGRESS NOTE  Brian [name] QPR:[id number] DOB: Jul 07, 1933 DOA: 03/13/2017 PCP: Marletta Lor, MD  HPI/Recap of past 24 hours:  Fever down, he denies cough, no chest pain , he is very anxious, he report progressive weakness  Assessment/Plan: Active Problems:   CAP (community acquired pneumonia)     Community Acquired pneumonia:  He presented with weakness, fever, cxr concerning for pneumonia, patient denies chest pain, no sob, no cough Respiratory viral panel and mrsa screening ordered He is started on IV rocephin and zithromax since admission. Blood cultures no growth so far, but these were obtained after the first dose of abx not done on admission. ua no infection Gentle hydration.    Hyponatremia: possibly from dehydration. Gentle hydration. Repeat sodium in am .  Paroxysmal Atrial fib :  Mostly in sinus rhythm Continue on metoprolol and  on eliquis 5 mg BID.    H/o stroke:  Left sided residual weakness/hemiparesis  No new symptoms.     Hypertension:  Well controlled.  Resume home meds.   BPH: Resume Proscar.   Fall /FTT:  Pt reports mechanical fall, unclear if he had syncope.  He is too weak to get up for orthostatic vital signs.  Monitor on telemetry overnight to evaluate for arrhythmias. Get PT evaluation. Likely will need SNF    DVT prophylaxis: (eliquis) Code Status:  (Full) Family Communication: patient Disposition Plan: patient is from home with 24/7 care, he is dependent on wheelchair  And walker, will get PT eval, will likely need home health if not rehab Consults called: none.    Procedures:  none  Antibiotics:  Rocephin/zithr   Objective: BP (!) 130/52 (BP Location: Left Arm)   Pulse 74   Temp 99 F (37.2 C) (Oral)   Resp 18   Ht 5\' 5"  (1.651 m)   Wt 64.9 kg (143 lb)   SpO2 96%   BMI 23.80 kg/m   Intake/Output Summary (Last 24 hours) at 03/14/17 1347 Last data filed at 03/14/17 1300  Gross per 24 hour    Intake             1140 ml  Output              700 ml  Net              440 ml   Filed Weights   03/13/17 1040 03/13/17 1848 03/14/17 0722  Weight: 63.5 kg (140 lb) 66.6 kg (146 lb 14.4 oz) 64.9 kg (143 lb)    Exam:   General:  Frail, chronically ill, anxious  Cardiovascular: RRR  Respiratory: CTABL  Abdomen: Soft/ND/NT, positive BS  Musculoskeletal: No Edema  Neuro: left sided weakness from prior stroke  Data Reviewed: Basic Metabolic Panel:  Recent Labs Lab 03/13/17 1145  NA 131*  K 4.0  CL 100*  CO2 23  GLUCOSE 114*  BUN 17  CREATININE 0.64  CALCIUM 9.2   Liver Function Tests: No results for input(s): AST, ALT, ALKPHOS, BILITOT, PROT, ALBUMIN in the last 168 hours. No results for input(s): LIPASE, AMYLASE in the last 168 hours. No results for input(s): AMMONIA in the last 168 hours. CBC:  Recent Labs Lab 03/13/17 1145  WBC 14.8*  HGB 14.5  HCT 42.9  MCV 91.7  PLT 194   Cardiac Enzymes:   No results for input(s): CKTOTAL, CKMB, CKMBINDEX, TROPONINI in the last 168 hours. BNP (last 3 results) No results for input(s): BNP in the last 8760 hours.  ProBNP (last  3 results) No results for input(s): PROBNP in the last 8760 hours.  CBG: No results for input(s): GLUCAP in the last 168 hours.  Recent Results (from the past 240 hour(s))  Culture, blood (Routine X 2) w Reflex to ID Panel     Status: None (Preliminary result)   Collection Time: 03/13/17 10:08 PM  Result Value Ref Range Status   Specimen Description BLOOD RIGHT HAND  Final   Special Requests   Final    BOTTLES DRAWN AEROBIC AND ANAEROBIC Blood Culture adequate volume   Culture NO GROWTH < 12 HOURS  Final   Report Status PENDING  Incomplete  MRSA PCR Screening     Status: None   Collection Time: 03/14/17 11:05 AM  Result Value Ref Range Status   MRSA by PCR NEGATIVE NEGATIVE Final    Comment:        The GeneXpert MRSA Assay (FDA approved for NASAL specimens only), is one  component of a comprehensive MRSA colonization surveillance program. It is not intended to diagnose MRSA infection nor to guide or monitor treatment for MRSA infections.      Studies: Dg Chest 2 View  Result Date: 03/13/2017 CLINICAL DATA:  Fall, fever EXAM: CHEST  2 VIEW COMPARISON:  05/06/2016 FINDINGS: Post sternotomy changes. Right lung is clear. Small left pleural effusion. Hazy atelectasis or infiltrate at the left base. Stable cardiomegaly. No pneumothorax. IMPRESSION: 1. Small left pleural effusion and hazy atelectasis or infiltrate at the left base 2. Mild cardiomegaly Electronically Signed   By: Donavan Foil M.D.   On: 03/13/2017 16:10    Scheduled Meds: . ALPRAZolam  0.25 mg Oral QHS  . apixaban  2.5 mg Oral BID  . finasteride  5 mg Oral Daily  . metoprolol succinate  25 mg Oral q morning - 10a   And  . metoprolol succinate  50 mg Oral Daily  . pantoprazole  40 mg Oral Daily  . sertraline  25 mg Oral Daily    Continuous Infusions: . sodium chloride 75 mL/hr at 03/13/17 2235  . azithromycin 500 mg (03/14/17 1113)  . cefTRIAXone (ROCEPHIN)  IV Stopped (03/14/17 1143)     Time spent: 50mins  Julane Crock MD, PhD  [facility] Pager [phone]. If 7PM-7AM, please contact night-coverage at [email or web address], password [email or web address] 03/14/2017, 1:47 PM  LOS: 1 day

## 2017-03-14 NOTE — Evaluation (Signed)
Physical Therapy Evaluation Patient Details Name: Brian [name] MRN: [id number] DOB: May 07, 1933 Today's Date: 03/14/2017   History of Present Illness  Pt is an 83 y/o male admitted secondary to sustaining a fall at home. Pt found to have CAP. 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  Clinical Impression  Pt presented supine in bed with HOB elevated, awake and willing to participate in therapy session. Prior to admission, pt reported that he was ambulating short distances with RW but mostly using a w/c for mobility secondary to knee pain. Pt stated that his orthopedic doctor told him to stay off of his feet as much as possible. Pt also requires assistance with ADLs and has aides that come daily to assist him. Pt currently requires max A to achieve sitting EOB and close min guard to mod A to maintain upright sitting balance at EOB. Pt would greatly benefit from [place] rehab at a SNF for further intensive therapy services prior to returning home; however, he is adamant about returning home at this time. Pt would continue to benefit from skilled physical therapy services at this time while admitted and after d/c to address the below listed limitations in order to improve overall safety and independence with functional mobility.     Follow Up Recommendations SNF;Supervision/Assistance - 24 hour    Equipment Recommendations  None recommended by PT    Recommendations for Other Services       Precautions / Restrictions Precautions Precautions: Fall Restrictions Weight Bearing Restrictions: No      Mobility  Bed Mobility Overal bed mobility: Needs Assistance Bed Mobility: Supine to Sit;Sit to Supine     Supine to sit: Max assist Sit to supine: Max assist   General bed mobility comments: increased time and effort, use of bed rails, assist with bilateral LEs and trunk to achieve sitting EOB and use of bed pads to position  pt's hips  Transfers                 General transfer comment: pt refusing at this time secondary to reports of bilateral knee pain. pt stated that his orthopedic doctor stated that he should "stay off" of his feet because his knees are "bone on bone"  Ambulation/Gait                Stairs            Wheelchair Mobility    Modified Rankin (Stroke Patients Only)       Balance Overall balance assessment: Needs assistance Sitting-balance support: Feet supported;Bilateral upper extremity supported Sitting balance-Leahy Scale: Poor Sitting balance - Comments: pt fluctuating between requiring mod A to close min guard to maintain sitting EOB with posterior lean Postural control: Posterior lean                                   Pertinent Vitals/Pain Pain Assessment: Faces Faces Pain Scale: Hurts little more Pain Location: bilateral knees Pain Descriptors / Indicators: Sore;Grimacing;Guarding Pain Intervention(s): Monitored during session;Repositioned    Home Living Family/patient expects to be discharged to:: Private residence Living Arrangements: Spouse/significant other;Children Available Help at Discharge: Family;Personal care attendant;Available 24 hours/day Type of Home: House Home Access: Stairs to enter Entrance Stairs-Rails: Psychiatric nurse of Steps: 4 Home Layout: One level Home Equipment: Walker - 2 wheels;Tub bench;Wheelchair - manual  Prior Function Level of Independence: Needs assistance   Gait / Transfers Assistance Needed: pt uses a w/c for mobility secondary to knee pain and weakness  ADL's / Homemaking Assistance Needed: pt requires assistance with all ADLs        Hand Dominance   Dominant Hand: Right    Extremity/Trunk Assessment   Upper Extremity Assessment Upper Extremity Assessment: Generalized weakness    Lower Extremity Assessment Lower Extremity Assessment: Generalized weakness     Cervical / Trunk Assessment Cervical / Trunk Assessment: Kyphotic  Communication   Communication: HOH  Cognition Arousal/Alertness: Awake/alert Behavior During Therapy: WFL for tasks assessed/performed Overall Cognitive Status: No family/caregiver present to determine baseline cognitive functioning Area of Impairment: Memory;Attention;Following commands;Safety/judgement;Problem solving                   Current Attention Level: Sustained Memory: Decreased short-term memory Following Commands: Follows one step commands with increased time Safety/Judgement: Decreased awareness of deficits;Decreased awareness of safety   Problem Solving: Slow processing;Decreased initiation;Difficulty sequencing;Requires verbal cues;Requires tactile cues        General Comments      Exercises     Assessment/Plan    PT Assessment Patient needs continued PT services  PT Problem List Decreased strength;Decreased activity tolerance;Decreased balance;Decreased mobility;Decreased coordination;Decreased cognition;Decreased knowledge of use of DME;Decreased safety awareness;Pain       PT Treatment Interventions DME instruction;Gait training;Stair training;Functional mobility training;Therapeutic activities;Therapeutic exercise;Balance training;Neuromuscular re-education;Cognitive remediation;Patient/family education    PT Goals (Current goals can be found in the Care Plan section)  Acute Rehab PT Goals Patient Stated Goal: return home PT Goal Formulation: With patient Time For Goal Achievement: 03/28/17 Potential to Achieve Goals: Fair    Frequency Min 3X/week   Barriers to discharge        Co-evaluation               AM-PAC PT "6 Clicks" Daily Activity  Outcome Measure Difficulty turning over in bed (including adjusting bedclothes, sheets and blankets)?: Total Difficulty moving from lying on back to sitting on the side of the bed? : Total Difficulty sitting down on and  standing up from a chair with arms (e.g., wheelchair, bedside commode, etc,.)?: Total Help needed moving to and from a bed to chair (including a wheelchair)?: Total Help needed walking in hospital room?: Total Help needed climbing 3-5 steps with a railing? : Total 6 Click Score: 6    End of Session   Activity Tolerance: Patient limited by fatigue;Patient limited by pain Patient left: in bed;with call bell/phone within reach Nurse Communication: Mobility status;Need for lift equipment PT Visit Diagnosis: Unsteadiness on feet (R26.81);Other abnormalities of gait and mobility (R26.89);Pain Pain - part of body: Knee    Time: 7353-2992 PT Time Calculation (min) (ACUTE ONLY): 24 min   Charges:   PT Evaluation $PT Eval Moderate Complexity: 1 Procedure PT Treatments $Therapeutic Activity: 8-22 mins   PT G Codes:        [place], PT, DPT [place] 03/14/2017, 4:31 PM

## 2017-03-15 ENCOUNTER — Telehealth: Payer: Self-pay | Admitting: Internal Medicine

## 2017-03-15 DIAGNOSIS — I480 Paroxysmal atrial fibrillation: Secondary | ICD-10-CM

## 2017-03-15 DIAGNOSIS — N179 Acute kidney failure, unspecified: Secondary | ICD-10-CM

## 2017-03-15 LAB — CBC
HCT: 40.7 % (ref 39.0–52.0)
Hemoglobin: 13.2 g/dL (ref 13.0–17.0)
MCH: 30.6 pg (ref 26.0–34.0)
MCHC: 32.4 g/dL (ref 30.0–36.0)
MCV: 94.2 fL (ref 78.0–100.0)
Platelets: 190 10*3/uL (ref 150–400)
RBC: 4.32 MIL/uL (ref 4.22–5.81)
RDW: 15.2 % (ref 11.5–15.5)
WBC: 8 10*3/uL (ref 4.0–10.5)

## 2017-03-15 LAB — BASIC METABOLIC PANEL
Anion gap: 5 (ref 5–15)
BUN: 8 mg/dL (ref 6–20)
CO2: 27 mmol/L (ref 22–32)
Calcium: 8.5 mg/dL — ABNORMAL LOW (ref 8.9–10.3)
Chloride: 105 mmol/L (ref 101–111)
Creatinine, Ser: 0.7 mg/dL (ref 0.61–1.24)
GFR calc Af Amer: >60 mL/min (ref >60)
GFR calc non Af Amer: >60 mL/min (ref >60)
Glucose, Bld: 99 mg/dL (ref 65–99)
Potassium: 3.9 mmol/L (ref 3.5–5.1)
Sodium: 137 mmol/L (ref 135–145)

## 2017-03-15 LAB — MAGNESIUM: MAGNESIUM: 1.7 mg/dL (ref 1.7–2.4)

## 2017-03-15 MED ORDER — SACCHAROMYCES BOULARDII 250 MG PO CAPS: 250.0000 mg | ORAL_CAPSULE | Freq: Two times a day (BID) | ORAL | 0 refills | Status: AC

## 2017-03-15 MED ORDER — CEFPODOXIME PROXETIL 100 MG PO TABS: 100.0000 mg | ORAL_TABLET | Freq: Two times a day (BID) | ORAL | 0 refills | Status: AC

## 2017-03-15 NOTE — Consult Note (Signed)
   [facility] Inpatient Consult   03/15/2017  Brian [name] 02-15-1933 [id number]   Patient screened for Medicare ACO.  Patient is admitted with CAP and progressive weakness with HX of fall and HX of CVA. Has been recommended for skilled nursing facility. Spoke with inpatient RNCM that patient is in [facility] registry.  Will follow for progress and needs to evaluate for [facility] program . Patient has private aide in the home note.   For referrals or needs for community care management, please contact.    Natividad Brood, RN BSN [facility] Liaison  [phone] business mobile phone Toll free office [phone]

## 2017-03-15 NOTE — Clinical Social Work Note (Signed)
CSW acknowledges SNF consult. Patient has orders to discharge home today.  CSW signing off.  Tyrece Vanterpool, CSW [phone]  

## 2017-03-15 NOTE — Progress Notes (Signed)
Discharge instructions reviewed with patient and son, verbalized understanding.  RN assisted patient to wheelchair and transported to main entrance of hospital to be taken home by son.

## 2017-03-15 NOTE — Progress Notes (Signed)
Physical Therapy Treatment Patient Details Name: Brian [name] MRN: [id number] DOB: 09/25/1932 Today's Date: 03/15/2017    History of Present Illness Pt is an 83 y/o male admitted secondary to sustaining a fall at home. Pt found to have CAP. 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    PT Comments    Pt pleasant and agreeable to mobilize with PT. Pt requires cues for safe mobility; however, pt seems to be progressing with mobility and able to ambulate in room using RW without much physical assist. Pt eager to progress and motivated to improve to return to PLOF. Will continue to follow for progression in mobility, activity tolerance and strengthening for safe d/c.    Follow Up Recommendations  SNF;Supervision/Assistance - 24 hour     Equipment Recommendations  None recommended by PT    Recommendations for Other Services       Precautions / Restrictions Precautions Precautions: Fall Restrictions Weight Bearing Restrictions: No    Mobility  Bed Mobility Overal bed mobility: Needs Assistance             General bed mobility comments: Pt in chair upon arrival   Transfers Overall transfer level: Needs assistance   Transfers: Sit to/from Stand Sit to Stand: Min assist;+2 safety/equipment         General transfer comment: Min A to rise. +2 for equipment (IV)  Ambulation/Gait Ambulation/Gait assistance: Min assist;+2 safety/equipment Ambulation Distance (Feet): 25 Feet Assistive device: Rolling walker (2 wheeled) Gait Pattern/deviations: Step-to pattern;Decreased stride length;Trunk flexed Gait velocity: slow Gait velocity interpretation: Below normal speed for age/gender General Gait Details: Min A for managing RW. VCs for sequencing and posture. +2 for equipment (IV); recommend chair follow also with longer distances    Stairs            Wheelchair Mobility    Modified Rankin  (Stroke Patients Only)       Balance Overall balance assessment: Needs assistance Sitting-balance support: Feet supported;Bilateral upper extremity supported Sitting balance-Leahy Scale: Fair Sitting balance - Comments: Pt not reliant on extremity support for seated balance    Standing balance support: Bilateral upper extremity supported;During functional activity Standing balance-Leahy Scale: Poor Standing balance comment: Pt has heavy reliance on use of RW for Bil UE support during ambulation                             Cognition Arousal/Alertness: Awake/alert Behavior During Therapy: WFL for tasks assessed/performed Overall Cognitive Status: Within Functional Limits for tasks assessed                                        Exercises General Exercises - Lower Extremity Long Arc Quad: AROM;Both;Seated;15 reps Hip Flexion/Marching: Seated;Both;15 reps;AROM    General Comments        Pertinent Vitals/Pain Pain Assessment: 0-10 Pain Score: 4  Pain Location: Lt knee Pain Descriptors / Indicators: Discomfort Pain Intervention(s): Limited activity within patient's tolerance;Monitored during session;Repositioned    Home Living                      Prior Function            PT Goals (current goals can now be found in the care plan section) Acute Rehab PT  Goals Patient Stated Goal: return home PT Goal Formulation: With patient Time For Goal Achievement: 03/29/17 Potential to Achieve Goals: Fair Progress towards PT goals: Progressing toward goals    Frequency    Min 3X/week      PT Plan Current plan remains appropriate    Co-evaluation              AM-PAC PT "6 Clicks" Daily Activity  Outcome Measure  Difficulty turning over in bed (including adjusting bedclothes, sheets and blankets)?: Total Difficulty moving from lying on back to sitting on the side of the bed? : Total Difficulty sitting down on and standing up  from a chair with arms (e.g., wheelchair, bedside commode, etc,.)?: Total Help needed moving to and from a bed to chair (including a wheelchair)?: A Little Help needed walking in hospital room?: A Little Help needed climbing 3-5 steps with a railing? : A Lot 6 Click Score: 11    End of Session Equipment Utilized During Treatment: Gait belt Activity Tolerance: Patient limited by fatigue;Patient limited by pain Patient left: in chair;with chair alarm set;with family/visitor present;with call bell/phone within reach Nurse Communication: Mobility status PT Visit Diagnosis: Unsteadiness on feet (R26.81);Other abnormalities of gait and mobility (R26.89);Pain;Difficulty in walking, not elsewhere classified (R26.2) Pain - Right/Left: Left Pain - part of body: Knee     Time: 8588-5027 PT Time Calculation (min) (ACUTE ONLY): 29 min  Charges:  $Gait Training: 8-22 mins $Therapeutic Exercise: 8-22 mins                    G Codes:       Brian [name], SPT Acute Rehab [place] 03/15/2017, 11:43 AM

## 2017-03-15 NOTE — Telephone Encounter (Signed)
Pt needs a letter to have ramp  Built for mobility purpose to get  In and out house. Please fax to daughter roxanne [phone]. [facility] is in [facility] now

## 2017-03-15 NOTE — Discharge Summary (Signed)
Discharge Summary  Brian [name] INO:[id number] DOB: Feb 01, 1933  PCP: Marletta Lor, MD  Admit date: 03/13/2017 Discharge date: 03/15/2017  Time spent: >49mins, more than 50% time spent on coordination of care and patient/family counseling   Recommendations for Outpatient Follow-up:  1. F/u with PMD within a week  for hospital discharge follow up, repeat cbc/bmp at follow up. pmd to repeat cxr in 4weeks to ensure resolution of left lower lung base infiltrate and associated small pleural effusion 2. F/u with orthpedics for chronic arthritis management 3. Patient declined SNF placement, home health arranged, discussed with son and personal care giver at bedside  Discharge Diagnoses:  Active Hospital Problems   Diagnosis Date Noted  . CAP (community acquired pneumonia) 03/13/2017    Resolved Hospital Problems   Diagnosis Date Noted Date Resolved  No resolved problems to display.    Discharge Condition: stable  Diet recommendation: heart healthy  Filed Weights   03/13/17 1848 03/14/17 0722 03/15/17 0615  Weight: 66.6 kg (146 lb 14.4 oz) 64.9 kg (143 lb) 63 kg (139 lb)    History of present illness:  PCP: Marletta Lor, MD  Patient coming from: Home  I have personally briefly reviewed patient's old medical records in [place]  Chief Complaint: fall at home.   HPI: Brian [name] is a 83 y.o. male with medical history significant of atrial fibrillation on eliquis, BPH, Hypertension. H/o stroke, anemia, PUD, brought in by family for recurrent falls today. Pt denies losing consciousness. He reports he tripped and fell. Family at the bedside is not sure about the falls. Patient is alert and appears oriented. On arrival to ED, he underwent CT head and cervical spine, which were negative for acute pathology. He was also found to be febrile in ED, further eval with a CXR showed small left pleural effusion and possible superimposed pneumonia. He denies any sob,  cough or chest pain. He denies any weakness or tingling or numbness of the legs or arms. Further lab work revealed mild hyponatremia and leukocytosis. He denies any dizziness or blurry vision. He denies nausea, vomiting, diarrhea, abdominal pain.  He was referred to medical service for evaluation and management of community acquired pneumonia.   Hospital Course:  Active Problems:   CAP (community acquired pneumonia)   Community Acquired pneumonia:  -He presented with weakness, fever of 101, cxr concerning for pneumonia, patient denies chest pain, no sob, no cough -Respiratory viral panel negative and mrsa screening negative, blood culture no growth, ua no infection, urine strep pneumo antigen negative -He is started on IV rocephin and zithromax since admission. He also received hydration, he has improved, fever resolved, leukocytosis resolved, he is feeling better, he wants to go home, he is discharged on vantin for finish abx treatment course. -he is to follow up with pmd to repeat cxr in 4weeks to ensure resolution of left lung base infiltrate and small pleural effusion on left lung base.   Hyponatremia: Sodium 131 on admission,  possibly from dehydration.  Sodium normalized after Gentle hydration.  Paroxysmal Atrial fib :   sinus rhythm with few pvc's on tele Continue on metoprolol and  on eliquis 5 mg BID.    H/o stroke:  Left sided residual weakness/hemiparesis  No new symptoms.     Hypertension:  Well controlled.  Resume home meds.   BPH: Resume Proscar.   Fall /FTT:  Pt reports mechanical fall, he denies syncope. CT head "Severe diffuse atrophy and chronic small vessel  disease. Old right basal ganglia infarcts. No acute intracranial abnormality." He was too weak to get up for orthostatic vital signs initially, he has improved, he declined snf placement now that he is feeling better, home health arranged.    Chronic arthritis pains, he is to follow with  orthopedics  Anxiety: continue home meds xanax 0.25 qhs    DVT prophylaxis:(eliquis) Code Status:(Full) Family Communication:patient and son at bedside Disposition Plan:patient declined snf, he wants to go home, home health arranged, son updated at bedside Consults called:none.    Procedures:  none  Antibiotics:  Rocephin/zithr   Discharge Exam: BP (!) 126/52 (BP Location: Right Arm)   Pulse 71   Temp 98.7 F (37.1 C) (Oral)   Resp 18   Ht 5\' 5"  (1.651 m)   Wt 63 kg (139 lb)   SpO2 98%   BMI 23.13 kg/m   General: frail, chronically ill, thin, NAD, sitting up in chair Cardiovascular: RRR Respiratory: diminished at basis, no wheezing, no rales, no rhonchi Extremity; no edeam  Discharge Instructions You were cared for by a hospitalist during your hospital stay. If you have any questions about your discharge medications or the care you received while you were in the hospital after you are discharged, you can call the unit and asked to speak with the hospitalist on call if the hospitalist that took care of you is not available. Once you are discharged, your primary care physician will handle any further medical issues. Please note that NO REFILLS for any discharge medications will be authorized once you are discharged, as it is imperative that you return to your primary care physician (or establish a relationship with a primary care physician if you do not have one) for your aftercare needs so that they can reassess your need for medications and monitor your lab values.  Discharge Instructions    Diet general    Complete by:  As directed    Face-to-face encounter (required for Medicare/Medicaid patients)    Complete by:  As directed    I Zyah Gomm certify that this patient is under my care and that I, or a nurse practitioner or physician's assistant working with me, had a face-to-face encounter that meets the physician face-to-face encounter requirements with this  patient on 03/15/2017. The encounter with the patient was in whole, or in part for the following medical condition(s) which is the primary reason for home health care (List medical condition): FTT   The encounter with the patient was in whole, or in part, for the following medical condition, which is the primary reason for home health care:  FTT   I certify that, based on my findings, the following services are medically necessary home health services:   Nursing Physical therapy     Reason for Medically Necessary Home Health Services:  Skilled Nursing- Change/Decline in Patient Status   My clinical findings support the need for the above services:  Unable to leave home safely without assistance and/or assistive device   Further, I certify that my clinical findings support that this patient is homebound due to:  Pain interferes with ambulation/mobility   Home Health    Complete by:  As directed    To provide the following care/treatments:   PT OT RN Home Health Aide     Increase activity slowly    Complete by:  As directed      Allergies as of 03/15/2017      Reactions   Novocain [procaine Hcl]  Irregular heart beat    Penicillins Itching   Has patient had a PCN reaction causing immediate rash, facial/tongue/throat swelling, SOB or lightheadedness with hypotension: Unk Has patient had a PCN reaction causing severe rash involving mucus membranes or skin necrosis: Unk Has patient had a PCN reaction that required hospitalization: Unk Has patient had a PCN reaction occurring within the last 10 years: No If all of the above answers are "NO", then may proceed with Cephalosporin use.      Medication List    STOP taking these medications   doxycycline 100 MG tablet Commonly known as:  VIBRA-TABS   hydrocortisone-pramoxine 2.5-1 % rectal cream Commonly known as:  ANALPRAM-HC     TAKE these medications   acetaminophen 325 MG tablet Commonly known as:  TYLENOL Take 650 mg by mouth 3  (three) times daily.   ALPRAZolam 0.25 MG tablet Commonly known as:  XANAX Take 1 tablet (0.25 mg total) by mouth at bedtime.   apixaban 2.5 MG Tabs tablet Commonly known as:  ELIQUIS Take 1 tablet (2.5 mg total) by mouth 2 (two) times daily. What changed:  Another medication with the same name was removed. Continue taking this medication, and follow the directions you see here.   cefpodoxime 100 MG tablet Commonly known as:  VANTIN Take 1 tablet (100 mg total) by mouth 2 (two) times daily.   clindamycin 150 MG capsule Commonly known as:  CLEOCIN TAKE 2 CAPSULES BY MOUTH PRIOR TO DENTAL WORK AS DIRECTED   finasteride 5 MG tablet Commonly known as:  PROSCAR Take 1 tablet (5 mg total) by mouth daily.   metoprolol succinate 25 MG 24 hr tablet Commonly known as:  TOPROL-XL Take 1 tablet (25 mg total) by mouth daily. What changed:  how much to take  when to take this  additional instructions   pantoprazole 40 MG tablet Commonly known as:  PROTONIX Take 1 tablet (40 mg total) by mouth daily.   pravastatin 20 MG tablet Commonly known as:  PRAVACHOL Take 1 tablet (20 mg total) by mouth daily.   saccharomyces boulardii 250 MG capsule Commonly known as:  FLORASTOR Take 1 capsule (250 mg total) by mouth 2 (two) times daily.   sertraline 25 MG tablet Commonly known as:  ZOLOFT Take 1 tablet (25 mg total) by mouth daily.      Allergies  Allergen Reactions  . Novocain [Procaine Hcl]     Irregular heart beat     . Penicillins Itching    Has patient had a PCN reaction causing immediate rash, facial/tongue/throat swelling, SOB or lightheadedness with hypotension: Unk Has patient had a PCN reaction causing severe rash involving mucus membranes or skin necrosis: Unk Has patient had a PCN reaction that required hospitalization: Unk Has patient had a PCN reaction occurring within the last 10 years: No If all of the above answers are "NO", then may proceed with Cephalosporin  use.    Follow-up Information    [facility] Follow up.   Specialty:  Emergency Medicine Why:  If symptoms worsen Contact information: [address] [phone] [place]       Marletta Lor, MD Follow up on 04/13/2017.   Specialty:  Internal Medicine Why:  At 12:45pm ......... pmd to repeat cxr in 4weeks to ensure resolution of left lung base infiltrate and small pleural effusion left lung base Contact information: [address] [place] [address] [phone]  follow up with your orthopedics surgery for chronic arthritis management Follow up.        [facility] Follow up.   Specialty:  [place] Why:  They will do your home health care at your home Contact information: [address] [place] [address] [phone]        [facility] Follow up.   Contact information: [address] [place] [phone]           The results of significant diagnostics from this hospitalization (including imaging, microbiology, ancillary and laboratory) are listed below for reference.    Significant Diagnostic Studies: Dg Chest 2 View  Result Date: 03/13/2017 CLINICAL DATA:  Fall, fever EXAM: CHEST  2 VIEW COMPARISON:  05/06/2016 FINDINGS: Post sternotomy changes. Right lung is clear. Small left pleural effusion. Hazy atelectasis or infiltrate at the left base. Stable cardiomegaly. No pneumothorax. IMPRESSION: 1. Small left pleural effusion and hazy atelectasis or infiltrate at the left base 2. Mild cardiomegaly Electronically Signed   By: Donavan Foil M.D.   On: 03/13/2017 16:10   Ct Head Wo Contrast  Result Date: 03/13/2017 CLINICAL DATA:  Fall, hit head.  On blood thinner. EXAM: CT HEAD WITHOUT CONTRAST CT CERVICAL SPINE WITHOUT CONTRAST TECHNIQUE: Multidetector CT imaging of the head and  cervical spine was performed following the standard protocol without intravenous contrast. Multiplanar CT image reconstructions of the cervical spine were also generated. COMPARISON:  05/10/2016 FINDINGS: CT HEAD FINDINGS Brain: Severe diffuse cerebral atrophy and chronic microvascular disease. Old right basal ganglia lacunar infarcts. Ex vacuo dilatation of the lateral ventricles. No acute intracranial abnormality. Specifically, no hemorrhage, hydrocephalus, mass lesion, acute infarction, or significant intracranial injury. Vascular: No hyperdense vessel or unexpected calcification. Skull: No acute calvarial abnormality. Sinuses/Orbits: Mucosal thickening throughout the paranasal sinuses. Air-fluid level in the left maxillary sinus. Other: None CT CERVICAL SPINE FINDINGS Alignment: Normal Skull base and vertebrae: No fracture. Hemangioma in the T3 vertebral body. Soft tissues and spinal canal: Prevertebral soft tissues are normal. No epidural or paraspinal hematoma. Disc levels: Diffuse severe degenerative disc disease changes with disc space narrowing and spurring. Mild degenerative facet disease bilaterally. Upper chest: No acute findings. Other: Negative IMPRESSION: Severe diffuse atrophy and chronic small vessel disease. Old right basal ganglia infarcts. No acute intracranial abnormality. Acute on chronic sinusitis. Cervical spondylosis.  No acute bony abnormality. Electronically Signed   By: Rolm Baptise M.D.   On: 03/13/2017 12:00   Ct Cervical Spine Wo Contrast  Result Date: 03/13/2017 CLINICAL DATA:  Fall, hit head.  On blood thinner. EXAM: CT HEAD WITHOUT CONTRAST CT CERVICAL SPINE WITHOUT CONTRAST TECHNIQUE: Multidetector CT imaging of the head and cervical spine was performed following the standard protocol without intravenous contrast. Multiplanar CT image reconstructions of the cervical spine were also generated. COMPARISON:  05/10/2016 FINDINGS: CT HEAD FINDINGS Brain: Severe diffuse cerebral  atrophy and chronic microvascular disease. Old right basal ganglia lacunar infarcts. Ex vacuo dilatation of the lateral ventricles. No acute intracranial abnormality. Specifically, no hemorrhage, hydrocephalus, mass lesion, acute infarction, or significant intracranial injury. Vascular: No hyperdense vessel or unexpected calcification. Skull: No acute calvarial abnormality. Sinuses/Orbits: Mucosal thickening throughout the paranasal sinuses. Air-fluid level in the left maxillary sinus. Other: None CT CERVICAL SPINE FINDINGS Alignment: Normal Skull base and vertebrae: No fracture. Hemangioma in the T3 vertebral body. Soft tissues and spinal canal: Prevertebral soft tissues are normal. No epidural or paraspinal hematoma. Disc levels: Diffuse severe degenerative  disc disease changes with disc space narrowing and spurring. Mild degenerative facet disease bilaterally. Upper chest: No acute findings. Other: Negative IMPRESSION: Severe diffuse atrophy and chronic small vessel disease. Old right basal ganglia infarcts. No acute intracranial abnormality. Acute on chronic sinusitis. Cervical spondylosis.  No acute bony abnormality. Electronically Signed   By: Rolm Baptise M.D.   On: 03/13/2017 12:00    Microbiology: Recent Results (from the past 240 hour(s))  Culture, blood (Routine X 2) w Reflex to ID Panel     Status: None (Preliminary result)   Collection Time: 03/13/17 10:08 PM  Result Value Ref Range Status   Specimen Description BLOOD RIGHT HAND  Final   Special Requests   Final    BOTTLES DRAWN AEROBIC AND ANAEROBIC Blood Culture adequate volume   Culture NO GROWTH 2 DAYS  Final   Report Status PENDING  Incomplete  Culture, blood (Routine X 2) w Reflex to ID Panel     Status: None (Preliminary result)   Collection Time: 03/13/17 11:31 PM  Result Value Ref Range Status   Specimen Description BLOOD LEFT HAND  Final   Special Requests   Final    BOTTLES DRAWN AEROBIC ONLY Blood Culture adequate volume    Culture NO GROWTH 1 DAY  Final   Report Status PENDING  Incomplete  Respiratory Panel by PCR     Status: None   Collection Time: 03/14/17  8:20 AM  Result Value Ref Range Status   Adenovirus NOT DETECTED NOT DETECTED Final   Coronavirus 229E NOT DETECTED NOT DETECTED Final   Coronavirus HKU1 NOT DETECTED NOT DETECTED Final   Coronavirus NL63 NOT DETECTED NOT DETECTED Final   Coronavirus OC43 NOT DETECTED NOT DETECTED Final   Metapneumovirus NOT DETECTED NOT DETECTED Final   Rhinovirus / Enterovirus NOT DETECTED NOT DETECTED Final   Influenza A NOT DETECTED NOT DETECTED Final   Influenza B NOT DETECTED NOT DETECTED Final   Parainfluenza Virus 1 NOT DETECTED NOT DETECTED Final   Parainfluenza Virus 2 NOT DETECTED NOT DETECTED Final   Parainfluenza Virus 3 NOT DETECTED NOT DETECTED Final   Parainfluenza Virus 4 NOT DETECTED NOT DETECTED Final   Respiratory Syncytial Virus NOT DETECTED NOT DETECTED Final   Bordetella pertussis NOT DETECTED NOT DETECTED Final   Chlamydophila pneumoniae NOT DETECTED NOT DETECTED Final   Mycoplasma pneumoniae NOT DETECTED NOT DETECTED Final  MRSA PCR Screening     Status: None   Collection Time: 03/14/17 11:05 AM  Result Value Ref Range Status   MRSA by PCR NEGATIVE NEGATIVE Final    Comment:        The GeneXpert MRSA Assay (FDA approved for NASAL specimens only), is one component of a comprehensive MRSA colonization surveillance program. It is not intended to diagnose MRSA infection nor to guide or monitor treatment for MRSA infections.      Labs: Basic Metabolic Panel:  Recent Labs Lab 03/13/17 1145 03/15/17 0427  NA 131* 137  K 4.0 3.9  CL 100* 105  CO2 23 27  GLUCOSE 114* 99  BUN 17 8  CREATININE 0.64 0.70  CALCIUM 9.2 8.5*  MG  --  1.7   Liver Function Tests: No results for input(s): AST, ALT, ALKPHOS, BILITOT, PROT, ALBUMIN in the last 168 hours. No results for input(s): LIPASE, AMYLASE in the last 168 hours. No results for  input(s): AMMONIA in the last 168 hours. CBC:  Recent Labs Lab 03/13/17 1145 03/15/17 0427  WBC 14.8* 8.0  HGB  14.5 13.2  HCT 42.9 40.7  MCV 91.7 94.2  PLT 194 190   Cardiac Enzymes: No results for input(s): CKTOTAL, CKMB, CKMBINDEX, TROPONINI in the last 168 hours. BNP: BNP (last 3 results) No results for input(s): BNP in the last 8760 hours.  ProBNP (last 3 results) No results for input(s): PROBNP in the last 8760 hours.  CBG: No results for input(s): GLUCAP in the last 168 hours.     SignedFlorencia Reasons MD, PhD  [facility] 03/15/2017, 7:13 PM

## 2017-03-15 NOTE — Progress Notes (Signed)
Upon reviewing case patient would benefit from the [place] through [facility]; CM called Karolee Stamps with [facility], she is to talk to the patient about the program; Aneta Mins [phone]

## 2017-03-16 ENCOUNTER — Other Ambulatory Visit: Payer: Self-pay | Admitting: Internal Medicine

## 2017-03-16 DIAGNOSIS — I480 Paroxysmal atrial fibrillation: Secondary | ICD-10-CM | POA: Diagnosis not present

## 2017-03-16 DIAGNOSIS — J181 Lobar pneumonia, unspecified organism: Secondary | ICD-10-CM | POA: Diagnosis not present

## 2017-03-16 NOTE — Telephone Encounter (Signed)
Please advise 

## 2017-03-17 DIAGNOSIS — J181 Lobar pneumonia, unspecified organism: Secondary | ICD-10-CM | POA: Diagnosis not present

## 2017-03-17 DIAGNOSIS — I480 Paroxysmal atrial fibrillation: Secondary | ICD-10-CM | POA: Diagnosis not present

## 2017-03-18 DIAGNOSIS — J181 Lobar pneumonia, unspecified organism: Secondary | ICD-10-CM | POA: Diagnosis not present

## 2017-03-18 DIAGNOSIS — I480 Paroxysmal atrial fibrillation: Secondary | ICD-10-CM | POA: Diagnosis not present

## 2017-03-18 LAB — CULTURE, BLOOD (ROUTINE X 2)
CULTURE: NO GROWTH
SPECIAL REQUESTS: ADEQUATE

## 2017-03-19 DIAGNOSIS — I480 Paroxysmal atrial fibrillation: Secondary | ICD-10-CM | POA: Diagnosis not present

## 2017-03-19 DIAGNOSIS — J181 Lobar pneumonia, unspecified organism: Secondary | ICD-10-CM | POA: Diagnosis not present

## 2017-03-19 LAB — CULTURE, BLOOD (ROUTINE X 2)
CULTURE: NO GROWTH
SPECIAL REQUESTS: ADEQUATE

## 2017-03-19 NOTE — Telephone Encounter (Signed)
Please advise 

## 2017-03-19 NOTE — Telephone Encounter (Signed)
Letter will be available for pickup tomorrow

## 2017-03-19 NOTE — Telephone Encounter (Signed)
° ° ° °  Daughter following up on letter for ramp

## 2017-03-20 DIAGNOSIS — J181 Lobar pneumonia, unspecified organism: Secondary | ICD-10-CM | POA: Diagnosis not present

## 2017-03-20 DIAGNOSIS — I480 Paroxysmal atrial fibrillation: Secondary | ICD-10-CM | POA: Diagnosis not present

## 2017-03-21 DIAGNOSIS — J181 Lobar pneumonia, unspecified organism: Secondary | ICD-10-CM | POA: Diagnosis not present

## 2017-03-21 DIAGNOSIS — I480 Paroxysmal atrial fibrillation: Secondary | ICD-10-CM | POA: Diagnosis not present

## 2017-03-21 NOTE — Telephone Encounter (Signed)
Fax confirmation at [facility] 03/21/17

## 2017-03-21 NOTE — Telephone Encounter (Signed)
Called daughter to inform that fax number she provided is not working.

## 2017-03-22 ENCOUNTER — Other Ambulatory Visit: Payer: Self-pay | Admitting: Internal Medicine

## 2017-03-22 ENCOUNTER — Telehealth: Payer: Self-pay | Admitting: Internal Medicine

## 2017-03-22 DIAGNOSIS — J181 Lobar pneumonia, unspecified organism: Secondary | ICD-10-CM | POA: Diagnosis not present

## 2017-03-22 DIAGNOSIS — I480 Paroxysmal atrial fibrillation: Secondary | ICD-10-CM | POA: Diagnosis not present

## 2017-03-22 NOTE — Telephone Encounter (Signed)
° °  Don with [facility]   OT 1 time a week for 3 weeks  For ADL transfer, exercise,and ok to dischare when goals met or maxium pertention    [phone]

## 2017-03-22 NOTE — Telephone Encounter (Signed)
Left a detailed message for [facility] with [facility] verbal orders given per Dr Raliegh Ip for OT 1 time a week for 3 weeks.  For ADL's, transfers, exercise,and ok to d/c when goals met.

## 2017-03-26 ENCOUNTER — Other Ambulatory Visit: Payer: Self-pay | Admitting: Internal Medicine

## 2017-03-26 DIAGNOSIS — Z8673 Personal history of transient ischemic attack (TIA), and cerebral infarction without residual deficits: Secondary | ICD-10-CM

## 2017-03-26 DIAGNOSIS — I480 Paroxysmal atrial fibrillation: Secondary | ICD-10-CM | POA: Diagnosis not present

## 2017-03-26 DIAGNOSIS — J181 Lobar pneumonia, unspecified organism: Secondary | ICD-10-CM | POA: Diagnosis not present

## 2017-03-27 ENCOUNTER — Ambulatory Visit (INDEPENDENT_AMBULATORY_CARE_PROVIDER_SITE_OTHER)
Admission: RE | Admit: 2017-03-27 | Discharge: 2017-03-27 | Disposition: A | Discharge status: Home / Self Care | Payer: Medicare Other | Source: Ambulatory Visit | Attending: Internal Medicine | Admitting: Internal Medicine

## 2017-03-27 ENCOUNTER — Other Ambulatory Visit: Payer: Self-pay | Admitting: Internal Medicine

## 2017-03-27 ENCOUNTER — Encounter: Payer: Self-pay | Admitting: Internal Medicine

## 2017-03-27 ENCOUNTER — Ambulatory Visit (INDEPENDENT_AMBULATORY_CARE_PROVIDER_SITE_OTHER): Payer: Medicare Other | Admitting: Internal Medicine

## 2017-03-27 VITALS — BP 110/62 | HR 76 | Temp 98.1°F | Ht 65.0 in

## 2017-03-27 DIAGNOSIS — I69854 Hemiplegia and hemiparesis following other cerebrovascular disease affecting left non-dominant side: Secondary | ICD-10-CM

## 2017-03-27 DIAGNOSIS — J189 Pneumonia, unspecified organism: Secondary | ICD-10-CM

## 2017-03-27 DIAGNOSIS — I10 Essential (primary) hypertension: Secondary | ICD-10-CM

## 2017-03-27 DIAGNOSIS — I480 Paroxysmal atrial fibrillation: Secondary | ICD-10-CM | POA: Diagnosis not present

## 2017-03-27 DIAGNOSIS — I63311 Cerebral infarction due to thrombosis of right middle cerebral artery: Secondary | ICD-10-CM

## 2017-03-27 NOTE — Patient Instructions (Signed)
Limit your sodium (Salt) intake  Continue physical therapy as discussed  Follow-up chest x-ray at the [facility] office on [address]

## 2017-03-27 NOTE — Progress Notes (Signed)
Subjective:    Patient ID: Brian [name], male    DOB: [birth year], 83 y.o.   MRN: [id number]  HPI  Admit date: 03/13/2017 Discharge date: 03/15/2017  Time spent: >85mins, more than 50% time spent on coordination of care and patient/family counseling   Recommendations for Outpatient Follow-up:  1. F/u with PMD within a week  for hospital discharge follow up, repeat cbc/bmp at follow up. pmd to repeat cxr in 4weeks to ensure resolution of left lower lung base infiltrate and associated small pleural effusion 2. F/u with orthpedics for chronic arthritis management 3. Patient declined SNF placement, home health arranged, discussed with son and personal care giver at bedside  Discharge Diagnoses:       Active Hospital Problems   Diagnosis Date Noted  . CAP (community acquired pneumonia) 03/13/2017   83 year old patient who is seen today following a recent hospital discharge And for transitional care management.   He presented to the ED with profound weakness resulting in a fall.  He was noted to be febrile and a chest x-ray revealed a left lower lobe infiltrate associated with a small parapneumonic effusion. Since his discharge, he has done well.  He continues to work with in-home physical therapy.  He is very pleased with his progress.  He is receiving considerable in-home assistance.    Hospital records reviewed, including discharge recommendations for outpatient follow-up  Past Medical History:  Diagnosis Date  . Anemia   . Atrial flutter ([place])   . BPH (benign prostatic hypertrophy)   . Epistaxis   . Hemorrhage of gastrointestinal tract, unspecified   . History of open heart surgery   . Hypertension   . Osteoarthrosis, unspecified whether generalized or localized, unspecified site   . Personal history of venous thrombosis and embolism   . Rosacea   . Stroke ([place])   . TIA (transient ischemic attack)      Social History   Social History  . Marital status: Married   Spouse name: N/A  . Number of children: N/A  . Years of education: N/A   Occupational History  . Not on file.   Social History Main Topics  . Smoking status: Former Research scientist (life sciences)  . Smokeless tobacco: Never Used     Comment: quit 50 yr ago  . Alcohol use No  . Drug use: No  . Sexual activity: Not on file   Other Topics Concern  . Not on file   Social History Narrative  . No narrative on file    Past Surgical History:  Procedure Laterality Date  . APPENDECTOMY    . HERNIA REPAIR    . IR GENERIC HISTORICAL  05/04/2016   IR PERCUTANEOUS ART THROMBECTOMY/INFUSION INTRACRANIAL INC DIAG ANGIO 05/04/2016 [name], MD [facility]  . IR GENERIC HISTORICAL  05/04/2016   IR ANGIO VERTEBRAL SEL SUBCLAVIAN INNOMINATE UNI R MOD SED 05/04/2016 [name], MD [facility]  . IR GENERIC HISTORICAL  06/07/2016   IR RADIOLOGIST EVAL & MGMT 06/07/2016 [facility]  . JOINT REPLACEMENT    . MITRAL VALVE REPAIR    . mvp repair    . RADIOLOGY WITH ANESTHESIA N/A 05/04/2016   Procedure: RADIOLOGY WITH ANESTHESIA;  Surgeon: [name], MD;  Location: [facility];  Service: Radiology;  Laterality: N/A;  . TOTAL HIP ARTHROPLASTY      Family History  Problem Relation Age of Onset  . Rheumatic fever Mother   . Coronary artery disease Father     Allergies  Allergen  Reactions  . Novocain [Procaine Hcl]     Irregular heart beat     . Penicillins Itching    Has patient had a PCN reaction causing immediate rash, facial/tongue/throat swelling, SOB or lightheadedness with hypotension: Unk Has patient had a PCN reaction causing severe rash involving mucus membranes or skin necrosis: Unk Has patient had a PCN reaction that required hospitalization: Unk Has patient had a PCN reaction occurring within the last 10 years: No If all of the above answers are "NO", then may proceed with Cephalosporin use.     Current Outpatient Prescriptions on File Prior to Visit  Medication Sig Dispense  Refill  . acetaminophen (TYLENOL) 325 MG tablet Take 650 mg by mouth 3 (three) times daily.    Marland Kitchen ALPRAZolam (XANAX) 0.25 MG tablet Take 1 tablet (0.25 mg total) by mouth at bedtime. 60 tablet 0  . apixaban (ELIQUIS) 2.5 MG TABS tablet Take 1 tablet (2.5 mg total) by mouth 2 (two) times daily. 60 tablet 0  . benazepril-hydrochlorthiazide (LOTENSIN HCT) 5-6.25 MG tablet TAKE 1 TABLET BY MOUTH DAILY 90 tablet 0  . clindamycin (CLEOCIN) 150 MG capsule TAKE 2 CAPSULES BY MOUTH PRIOR TO DENTAL WORK AS DIRECTED 2 capsule 2  . finasteride (PROSCAR) 5 MG tablet Take 1 tablet (5 mg total) by mouth daily. 90 tablet 3  . metoprolol succinate (TOPROL-XL) 25 MG 24 hr tablet Take 1 tablet (25 mg total) by mouth daily. (Patient taking differently: Take 25-50 mg by mouth See admin instructions. 25mg  in am, 50mg  in pm) 270 tablet 0  . metoprolol succinate (TOPROL-XL) 25 MG 24 hr tablet TAKE 1 TABLET BY MOUTH EVERY MORNING AND TAKE 2 TABLETS BY MOUTH EVERY EVENING 270 tablet 0  . pantoprazole (PROTONIX) 40 MG tablet Take 1 tablet (40 mg total) by mouth daily. 90 tablet 3  . pravastatin (PRAVACHOL) 20 MG tablet Take 1 tablet (20 mg total) by mouth daily. 90 tablet 3  . saccharomyces boulardii (FLORASTOR) 250 MG capsule Take 1 capsule (250 mg total) by mouth 2 (two) times daily. 30 capsule 0  . sertraline (ZOLOFT) 25 MG tablet Take 1 tablet (25 mg total) by mouth daily. 30 tablet 5  . ELIQUIS 2.5 MG TABS tablet TAKE 1 TABLET BY MOUTH TWICE DAILY (Patient not taking: Reported on 03/27/2017) 60 tablet 0   No current facility-administered medications on file prior to visit.     BP 110/62 (BP Location: Left Arm, Patient Position: Sitting, Cuff Size: Normal)   Pulse 76   Temp 98.1 F (36.7 C) (Oral)   Ht 5\' 5"  (1.651 m)   SpO2 95%     Review of Systems  Constitutional: Negative for appetite change, chills, fatigue and fever.  HENT: Negative for congestion, dental problem, ear pain, hearing loss, sore throat,  tinnitus, trouble swallowing and voice change.   Eyes: Negative for pain, discharge and visual disturbance.  Respiratory: Negative for cough, chest tightness, wheezing and stridor.   Cardiovascular: Negative for chest pain, palpitations and leg swelling.  Gastrointestinal: Negative for abdominal distention, abdominal pain, blood in stool, constipation, diarrhea, nausea and vomiting.  Genitourinary: Negative for difficulty urinating, discharge, flank pain, genital sores, hematuria and urgency.  Musculoskeletal: Negative for arthralgias, back pain, gait problem, joint swelling, myalgias and neck stiffness.  Skin: Negative for rash.  Neurological: Negative for dizziness, syncope, speech difficulty, weakness, numbness and headaches.  Hematological: Negative for adenopathy. Does not bruise/bleed easily.  Psychiatric/Behavioral: Negative for behavioral problems and dysphoric mood. The patient is  not nervous/anxious.        Objective:   Physical Exam  Constitutional: He is oriented to person, place, and time. He appears well-developed. No distress.  Alert  No distress Blood pressure low normal Afebrile  HENT:  Head: Normocephalic.  Right Ear: External ear normal.  Left Ear: External ear normal.  Eyes: Conjunctivae and EOM are normal.  Neck: Normal range of motion.  Cardiovascular: Normal rate and normal heart sounds.   Pulmonary/Chest: Breath sounds normal. No respiratory distress. He has no wheezes. He has no rales.  Abdominal: Bowel sounds are normal.  Musculoskeletal: Normal range of motion. He exhibits no edema or tenderness.  Neurological: He is alert and oriented to person, place, and time.  Left-sided weakness  Psychiatric: He has a normal mood and affect. His behavior is normal.          Assessment & Plan:   Status post community acquired pneumonia.  Will check follow-up lab in follow-up chest x-ray to confirm resolution of left lower lobe infiltrate Cerebrovascular  disease with left hemiparesis.  Continue home physical therapy Essential hypertension, well-controlled Paroxysmal atrial fibrillation.  Continue chronic anticoagulation  No change in medical regimen Review lab and chest x-ray Follow-up 3 months or as needed  Nyoka Cowden

## 2017-03-28 ENCOUNTER — Telehealth: Payer: Self-pay | Admitting: Internal Medicine

## 2017-03-28 ENCOUNTER — Telehealth: Payer: Self-pay

## 2017-03-28 DIAGNOSIS — I480 Paroxysmal atrial fibrillation: Secondary | ICD-10-CM | POA: Diagnosis not present

## 2017-03-28 DIAGNOSIS — J181 Lobar pneumonia, unspecified organism: Secondary | ICD-10-CM | POA: Diagnosis not present

## 2017-03-28 LAB — CBC WITH DIFFERENTIAL/PLATELET
BASOS ABS: 0 10*3/uL (ref 0.0–0.1)
Basophils Relative: 0.4 % (ref 0.0–3.0)
EOS ABS: 0 10*3/uL (ref 0.0–0.7)
Eosinophils Relative: 0.7 % (ref 0.0–5.0)
HEMATOCRIT: 41.3 % (ref 39.0–52.0)
Hemoglobin: 14 g/dL (ref 13.0–17.0)
LYMPHS PCT: 18.4 % (ref 12.0–46.0)
Lymphs Abs: 1.3 10*3/uL (ref 0.7–4.0)
MCHC: 33.8 g/dL (ref 30.0–36.0)
MCV: 94.4 fl (ref 78.0–100.0)
MONO ABS: 0.6 10*3/uL (ref 0.1–1.0)
Monocytes Relative: 8 % (ref 3.0–12.0)
NEUTROS ABS: 5.2 10*3/uL (ref 1.4–7.7)
NEUTROS PCT: 72.5 % (ref 43.0–77.0)
PLATELETS: 313 10*3/uL (ref 150.0–400.0)
RBC: 4.38 Mil/uL (ref 4.22–5.81)
RDW: 15 % (ref 11.5–15.5)
WBC: 7.2 10*3/uL (ref 4.0–10.5)

## 2017-03-28 LAB — BASIC METABOLIC PANEL
BUN: 22 mg/dL (ref 6–23)
CALCIUM: 9 mg/dL (ref 8.4–10.5)
CO2: 26 mEq/L (ref 19–32)
CREATININE: 0.65 mg/dL (ref 0.40–1.50)
Chloride: 104 mEq/L (ref 96–112)
GFR: 124.93 mL/min (ref >60.00)
Glucose, Bld: 131 mg/dL — ABNORMAL HIGH (ref 70–99)
Potassium: 4.5 mEq/L (ref 3.5–5.1)
Sodium: 134 mEq/L — ABNORMAL LOW (ref 135–145)

## 2017-03-28 NOTE — Telephone Encounter (Signed)
Pt calling to get his lab results and pt is aware that someone will call him back when they are ready.  Pt would like to know if it is okay to go back on his doxycycline.

## 2017-03-28 NOTE — Telephone Encounter (Signed)
Pt is currently active with [facility], so [facility] cannot start patient on home health. Please advise if order needs to be canceled.   Dr. Raliegh Ip - Jul[facility] Rainier. Thanks!

## 2017-03-28 NOTE — Telephone Encounter (Signed)
Melissa - FYI. Thanks!

## 2017-03-28 NOTE — Telephone Encounter (Signed)
Okay to cancel order for [facility]

## 2017-03-28 NOTE — Telephone Encounter (Signed)
° ° ° °  Pt following up on his land and xray. Would like results asap

## 2017-03-29 ENCOUNTER — Telehealth: Payer: Self-pay

## 2017-03-29 DIAGNOSIS — I480 Paroxysmal atrial fibrillation: Secondary | ICD-10-CM | POA: Diagnosis not present

## 2017-03-29 DIAGNOSIS — J181 Lobar pneumonia, unspecified organism: Secondary | ICD-10-CM | POA: Diagnosis not present

## 2017-03-29 NOTE — Telephone Encounter (Signed)
Spoke with pt this morning and informed him of his x ray results.

## 2017-03-29 NOTE — Telephone Encounter (Signed)
Jim @ [facility] called to report that he has spoken with pt/wife and they report that they were unsatisfied with [facility] and wanted to switch to [facility]. He states that pt reports he does leave the house to go to work 2 days a week, therefore he does not qualify for in-home care as he is not home-bound. Clair Gulling has suggested that pt may benefit from outpatient PT instead. He also mentions that pt is having issues with his nightly condom-cath and is wanting an in-home aid for nighttime to assist with this. Per [facility] is the only company that handles this type of order, so pt would have to stay with them to have this service.   Dr. Raliegh Ip - Please advise. Thanks!

## 2017-03-30 DIAGNOSIS — I480 Paroxysmal atrial fibrillation: Secondary | ICD-10-CM | POA: Diagnosis not present

## 2017-03-30 DIAGNOSIS — J181 Lobar pneumonia, unspecified organism: Secondary | ICD-10-CM | POA: Diagnosis not present

## 2017-03-30 MED ORDER — DOXYCYCLINE HYCLATE 100 MG PO TABS: 100.0000 mg | ORAL_TABLET | Freq: Two times a day (BID) | ORAL | 0 refills | Status: DC

## 2017-03-30 NOTE — Telephone Encounter (Signed)
Spoke with [facility] @ [facility]. She states that they are having issues with aides in the home in that the pt's wife keeps complaining when the aides will not do household chores. There is also an issue with the pt's dog as he is aggressive when anyone attempts to work with the patient. He has nipped at several aides and has bitten the pt's son. Bethena Roys is concerned that if the dog bites an aide they will have to withdraw care. She also is concerned that if pt is provided with a bedside commode that pt will lose the little bit of physical functionality that he has. She states that this will provide him with a reason to not have to get up and walk to the bathroom, which he needs to be doing daily to maintain his strength and mobility. She advised that condom cath be left as order at this time.   Spoke with pt and he understands all of the above. He is also asking to resume taking his doxycycline.  Spoke with Dr. Raliegh Ip and he agrees with all above and would like to see pt in about 30days. Rx sent. Pt scheduled for 04/24/17 and is aware.

## 2017-03-30 NOTE — Telephone Encounter (Signed)
Okay for outpatient physical therapy Okay to discontinue condom cath Bedside commode if patient desires

## 2017-04-02 ENCOUNTER — Telehealth: Payer: Self-pay | Admitting: Internal Medicine

## 2017-04-02 DIAGNOSIS — I480 Paroxysmal atrial fibrillation: Secondary | ICD-10-CM | POA: Diagnosis not present

## 2017-04-02 DIAGNOSIS — J181 Lobar pneumonia, unspecified organism: Secondary | ICD-10-CM | POA: Diagnosis not present

## 2017-04-02 NOTE — Telephone Encounter (Signed)
Error/njr °

## 2017-04-03 DIAGNOSIS — I480 Paroxysmal atrial fibrillation: Secondary | ICD-10-CM | POA: Diagnosis not present

## 2017-04-03 DIAGNOSIS — J181 Lobar pneumonia, unspecified organism: Secondary | ICD-10-CM | POA: Diagnosis not present

## 2017-04-12 ENCOUNTER — Other Ambulatory Visit: Payer: Self-pay | Admitting: Internal Medicine

## 2017-04-13 ENCOUNTER — Ambulatory Visit: Payer: Medicare Other | Admitting: Internal Medicine

## 2017-04-24 ENCOUNTER — Ambulatory Visit: Payer: Medicare Other | Admitting: Internal Medicine

## 2017-04-24 ENCOUNTER — Encounter: Payer: Self-pay | Admitting: Internal Medicine

## 2017-04-24 ENCOUNTER — Ambulatory Visit (INDEPENDENT_AMBULATORY_CARE_PROVIDER_SITE_OTHER): Payer: Medicare Other | Admitting: Internal Medicine

## 2017-04-24 VITALS — BP 102/74 | HR 89 | Temp 98.2°F | Ht 65.0 in | Wt 149.2 lb

## 2017-04-24 DIAGNOSIS — I6300 Cerebral infarction due to thrombosis of unspecified precerebral artery: Secondary | ICD-10-CM | POA: Diagnosis not present

## 2017-04-24 DIAGNOSIS — M150 Primary generalized (osteo)arthritis: Secondary | ICD-10-CM | POA: Diagnosis not present

## 2017-04-24 DIAGNOSIS — I2510 Atherosclerotic heart disease of native coronary artery without angina pectoris: Secondary | ICD-10-CM | POA: Diagnosis not present

## 2017-04-24 DIAGNOSIS — M159 Polyosteoarthritis, unspecified: Secondary | ICD-10-CM

## 2017-04-24 DIAGNOSIS — I10 Essential (primary) hypertension: Secondary | ICD-10-CM

## 2017-04-24 DIAGNOSIS — M8949 Other hypertrophic osteoarthropathy, multiple sites: Secondary | ICD-10-CM

## 2017-04-24 DIAGNOSIS — I480 Paroxysmal atrial fibrillation: Secondary | ICD-10-CM | POA: Diagnosis not present

## 2017-04-24 MED ORDER — ALPRAZOLAM 0.25 MG PO TABS: 0.2500 mg | ORAL_TABLET | Freq: Every day | ORAL | 0 refills | Status: DC

## 2017-04-24 NOTE — Progress Notes (Signed)
Subjective:    Patient ID: Brian [name], male    DOB: [birth year], 83 y.o.   MRN: [id number]  HPI  83 year old patient who is seen today in follow-up. He has been hospitalized recently for a community acquired pneumonia and seems to be improving. He has a history of cerebrovascular disease and remains on chronic anticoagulation.  He has paroxysmal atrial fibrillation.  He has essential hypertension. He continues to receive in-home help and is accompanied by a caregiver today.  Is requesting form completion for handicap parking. Forms were completed today  Past Medical History:  Diagnosis Date  . Anemia   . Atrial flutter ([place])   . BPH (benign prostatic hypertrophy)   . Epistaxis   . Hemorrhage of gastrointestinal tract, unspecified   . History of open heart surgery   . Hypertension   . Osteoarthrosis, unspecified whether generalized or localized, unspecified site   . Personal history of venous thrombosis and embolism   . Rosacea   . Stroke ([place])   . TIA (transient ischemic attack)      Social History   Social History  . Marital status: Married    Spouse name: N/A  . Number of children: N/A  . Years of education: N/A   Occupational History  . Not on file.   Social History Main Topics  . Smoking status: Former Research scientist (life sciences)  . Smokeless tobacco: Never Used     Comment: quit 50 yr ago  . Alcohol use No  . Drug use: No  . Sexual activity: Not on file   Other Topics Concern  . Not on file   Social History Narrative  . No narrative on file    Past Surgical History:  Procedure Laterality Date  . APPENDECTOMY    . HERNIA REPAIR    . IR GENERIC HISTORICAL  05/04/2016   IR PERCUTANEOUS ART THROMBECTOMY/INFUSION INTRACRANIAL INC DIAG ANGIO 05/04/2016 [name], MD [facility]  . IR GENERIC HISTORICAL  05/04/2016   IR ANGIO VERTEBRAL SEL SUBCLAVIAN INNOMINATE UNI R MOD SED 05/04/2016 [name], MD [facility]  . IR GENERIC HISTORICAL  06/07/2016   IR  RADIOLOGIST EVAL & MGMT 06/07/2016 [facility]  . JOINT REPLACEMENT    . MITRAL VALVE REPAIR    . mvp repair    . RADIOLOGY WITH ANESTHESIA N/A 05/04/2016   Procedure: RADIOLOGY WITH ANESTHESIA;  Surgeon: [name], MD;  Location: [facility];  Service: Radiology;  Laterality: N/A;  . TOTAL HIP ARTHROPLASTY      Family History  Problem Relation Age of Onset  . Rheumatic fever Mother   . Coronary artery disease Father     Allergies  Allergen Reactions  . Novocain [Procaine Hcl]     Irregular heart beat     . Penicillins Itching    Has patient had a PCN reaction causing immediate rash, facial/tongue/throat swelling, SOB or lightheadedness with hypotension: Unk Has patient had a PCN reaction causing severe rash involving mucus membranes or skin necrosis: Unk Has patient had a PCN reaction that required hospitalization: Unk Has patient had a PCN reaction occurring within the last 10 years: No If all of the above answers are "NO", then may proceed with Cephalosporin use.     Current Outpatient Prescriptions on File Prior to Visit  Medication Sig Dispense Refill  . acetaminophen (TYLENOL) 325 MG tablet Take 650 mg by mouth 3 (three) times daily.    Marland Kitchen apixaban (ELIQUIS) 2.5 MG TABS tablet Take 1 tablet (2.5 mg  total) by mouth 2 (two) times daily. 60 tablet 0  . benazepril-hydrochlorthiazide (LOTENSIN HCT) 5-6.25 MG tablet TAKE 1 TABLET BY MOUTH DAILY 90 tablet 0  . clindamycin (CLEOCIN) 150 MG capsule TAKE 2 CAPSULES BY MOUTH PRIOR TO DENTAL WORK AS DIRECTED 2 capsule 2  . doxycycline (VIBRA-TABS) 100 MG tablet Take 1 tablet (100 mg total) by mouth 2 (two) times daily. 60 tablet 0  . ELIQUIS 2.5 MG TABS tablet TAKE 1 TABLET BY MOUTH TWICE DAILY 60 tablet 0  . finasteride (PROSCAR) 5 MG tablet Take 1 tablet (5 mg total) by mouth daily. 90 tablet 3  . metoprolol succinate (TOPROL-XL) 25 MG 24 hr tablet Take 1 tablet (25 mg total) by mouth daily. (Patient taking differently: Take  25-50 mg by mouth See admin instructions. 25mg  in am, 50mg  in pm) 270 tablet 0  . metoprolol succinate (TOPROL-XL) 25 MG 24 hr tablet TAKE 1 TABLET BY MOUTH EVERY MORNING AND TAKE 2 TABLETS BY MOUTH EVERY EVENING 270 tablet 0  . pantoprazole (PROTONIX) 40 MG tablet Take 1 tablet (40 mg total) by mouth daily. 90 tablet 3  . pravastatin (PRAVACHOL) 20 MG tablet Take 1 tablet (20 mg total) by mouth daily. 90 tablet 3  . sertraline (ZOLOFT) 25 MG tablet Take 1 tablet (25 mg total) by mouth daily. 30 tablet 5   No current facility-administered medications on file prior to visit.     BP 102/74 (BP Location: Left Arm, Patient Position: Sitting, Cuff Size: Normal)   Pulse 89   Temp 98.2 F (36.8 C) (Oral)   Ht 5\' 5"  (1.651 m)   Wt 149 lb 3.2 oz (67.7 kg)   SpO2 97%   BMI 24.83 kg/m     Review of Systems  Constitutional: Negative for appetite change, chills, fatigue and fever.  HENT: Negative for congestion, dental problem, ear pain, hearing loss, sore throat, tinnitus, trouble swallowing and voice change.   Eyes: Negative for pain, discharge and visual disturbance.  Respiratory: Negative for cough, chest tightness, wheezing and stridor.   Cardiovascular: Negative for chest pain, palpitations and leg swelling.  Gastrointestinal: Negative for abdominal distention, abdominal pain, blood in stool, constipation, diarrhea, nausea and vomiting.  Genitourinary: Negative for difficulty urinating, discharge, flank pain, genital sores, hematuria and urgency.  Musculoskeletal: Positive for gait problem. Negative for arthralgias, back pain, joint swelling, myalgias and neck stiffness.  Skin: Negative for rash.  Neurological: Positive for weakness. Negative for dizziness, syncope, speech difficulty, numbness and headaches.  Hematological: Negative for adenopathy. Does not bruise/bleed easily.  Psychiatric/Behavioral: Negative for behavioral problems and dysphoric mood. The patient is nervous/anxious.         Objective:   Physical Exam  Constitutional: He is oriented to person, place, and time. He appears well-developed. No distress.   Sitting in wheelchair Blood pressure low normal O2 saturation 97%  HENT:  Head: Normocephalic.  Right Ear: External ear normal.  Left Ear: External ear normal.  Eyes: Conjunctivae and EOM are normal.  Neck: Normal range of motion.  Cardiovascular: Normal rate and normal heart sounds.   irregular  Pulmonary/Chest: Effort normal. No respiratory distress. He has no wheezes. He has no rales.  Decreased breath sounds left base  Abdominal: Bowel sounds are normal.  Musculoskeletal: Normal range of motion. He exhibits no edema or tenderness.  Neurological: He is alert and oriented to person, place, and time.  Left-sided weakness  Psychiatric: He has a normal mood and affect. His behavior is normal.  Assessment & Plan:   Cerebrovascular disease Essential hypertension.  Blood pressure well controlled Paroxysmal atrial fibrillation.  Continue anticoagulation Status post CVA Coronary artery disease, stable.  Continue statin therapy  Medications updated Forms completed for handicap placard Follow-up 3 months Cardiology follow-up as scheduled  Nyoka Cowden

## 2017-04-24 NOTE — Patient Instructions (Addendum)
Limit your sodium (Salt) intake  Please walk and exercise more  using your walker  No change in medical regimen

## 2017-05-02 ENCOUNTER — Other Ambulatory Visit: Payer: Self-pay | Admitting: Internal Medicine

## 2017-05-07 ENCOUNTER — Other Ambulatory Visit: Payer: Self-pay | Admitting: Internal Medicine

## 2017-05-07 MED ORDER — DOXYCYCLINE HYCLATE 100 MG PO TABS: 100.0000 mg | ORAL_TABLET | Freq: Every day | ORAL | 0 refills | Status: DC

## 2017-05-10 ENCOUNTER — Encounter: Payer: Self-pay | Admitting: Internal Medicine

## 2017-05-14 DIAGNOSIS — Z87891 Personal history of nicotine dependence: Secondary | ICD-10-CM | POA: Diagnosis not present

## 2017-05-14 DIAGNOSIS — M19012 Primary osteoarthritis, left shoulder: Secondary | ICD-10-CM | POA: Diagnosis not present

## 2017-05-14 DIAGNOSIS — Z8673 Personal history of transient ischemic attack (TIA), and cerebral infarction without residual deficits: Secondary | ICD-10-CM | POA: Diagnosis not present

## 2017-05-14 DIAGNOSIS — Z7901 Long term (current) use of anticoagulants: Secondary | ICD-10-CM | POA: Diagnosis not present

## 2017-05-14 DIAGNOSIS — I4891 Unspecified atrial fibrillation: Secondary | ICD-10-CM | POA: Diagnosis not present

## 2017-05-14 DIAGNOSIS — I10 Essential (primary) hypertension: Secondary | ICD-10-CM | POA: Diagnosis not present

## 2017-05-17 DIAGNOSIS — Z23 Encounter for immunization: Secondary | ICD-10-CM | POA: Diagnosis not present

## 2017-06-01 ENCOUNTER — Other Ambulatory Visit: Payer: Self-pay | Admitting: Internal Medicine

## 2017-06-14 DIAGNOSIS — M25561 Pain in right knee: Secondary | ICD-10-CM | POA: Diagnosis not present

## 2017-06-14 DIAGNOSIS — M1712 Unilateral primary osteoarthritis, left knee: Secondary | ICD-10-CM | POA: Diagnosis not present

## 2017-06-25 ENCOUNTER — Other Ambulatory Visit: Payer: Self-pay | Admitting: Internal Medicine

## 2017-07-03 ENCOUNTER — Ambulatory Visit: Payer: Medicare Other | Admitting: Internal Medicine

## 2017-07-03 ENCOUNTER — Encounter: Payer: Self-pay | Admitting: Internal Medicine

## 2017-07-03 ENCOUNTER — Ambulatory Visit (INDEPENDENT_AMBULATORY_CARE_PROVIDER_SITE_OTHER): Payer: Medicare Other | Admitting: Internal Medicine

## 2017-07-03 VITALS — BP 100/62 | HR 65 | Temp 98.1°F | Wt 146.0 lb

## 2017-07-03 DIAGNOSIS — B9789 Other viral agents as the cause of diseases classified elsewhere: Secondary | ICD-10-CM

## 2017-07-03 DIAGNOSIS — I63311 Cerebral infarction due to thrombosis of right middle cerebral artery: Secondary | ICD-10-CM | POA: Diagnosis not present

## 2017-07-03 DIAGNOSIS — I483 Typical atrial flutter: Secondary | ICD-10-CM

## 2017-07-03 DIAGNOSIS — I10 Essential (primary) hypertension: Secondary | ICD-10-CM

## 2017-07-03 DIAGNOSIS — I2510 Atherosclerotic heart disease of native coronary artery without angina pectoris: Secondary | ICD-10-CM

## 2017-07-03 DIAGNOSIS — J069 Acute upper respiratory infection, unspecified: Secondary | ICD-10-CM | POA: Diagnosis not present

## 2017-07-03 MED ORDER — PRAVASTATIN SODIUM 20 MG PO TABS: 20.0000 mg | ORAL_TABLET | Freq: Every day | ORAL | 3 refills | Status: DC

## 2017-07-03 MED ORDER — FINASTERIDE 5 MG PO TABS: 5.0000 mg | ORAL_TABLET | Freq: Every day | ORAL | 3 refills | Status: DC

## 2017-07-03 MED ORDER — DOXYCYCLINE HYCLATE 100 MG PO TABS: 100.0000 mg | ORAL_TABLET | Freq: Every day | ORAL | 0 refills | Status: DC

## 2017-07-03 MED ORDER — BENAZEPRIL-HYDROCHLOROTHIAZIDE 5-6.25 MG PO TABS: 1.0000 | ORAL_TABLET | Freq: Every day | ORAL | 0 refills | Status: DC

## 2017-07-03 MED ORDER — SERTRALINE HCL 25 MG PO TABS: 25.0000 mg | ORAL_TABLET | Freq: Every day | ORAL | 5 refills | Status: DC

## 2017-07-03 MED ORDER — APIXABAN 2.5 MG PO TABS: 2.5000 mg | ORAL_TABLET | Freq: Two times a day (BID) | ORAL | 6 refills | Status: DC

## 2017-07-03 MED ORDER — ALPRAZOLAM 0.25 MG PO TABS: 0.2500 mg | ORAL_TABLET | Freq: Every day | ORAL | 0 refills | Status: DC

## 2017-07-03 MED ORDER — PANTOPRAZOLE SODIUM 40 MG PO TBEC: 40.0000 mg | DELAYED_RELEASE_TABLET | Freq: Every day | ORAL | 3 refills | Status: DC

## 2017-07-03 NOTE — Patient Instructions (Addendum)

## 2017-07-03 NOTE — Progress Notes (Signed)
Subjective:    Patient ID: DOC MANDALA, male    DOB: 1932-11-29, 83 y.o.   MRN: [id number]  HPI  83 year old patient who is seen today for follow-up.  He has essential hypertension atrial fibrillation and prior CVA.  For the past week he has had some chest congestion and cough.  He was treated earlier in the year for a Community-acquired pneumonia.  He has been followed by orthopedics for pain mainly involving the left shoulder area.  Otherwise doing fairly well.  He is accompanied by a home aide today.  Past Medical History:  Diagnosis Date  . Anemia   . Atrial flutter ([place])   . BPH (benign prostatic hypertrophy)   . Epistaxis   . Hemorrhage of gastrointestinal tract, unspecified   . History of open heart surgery   . Hypertension   . Osteoarthrosis, unspecified whether generalized or localized, unspecified site   . Personal history of venous thrombosis and embolism   . Rosacea   . Stroke ([place])   . TIA (transient ischemic attack)      Social History   Socioeconomic History  . Marital status: Married    Spouse name: Not on file  . Number of children: Not on file  . Years of education: Not on file  . Highest education level: Not on file  Social Needs  . Financial resource strain: Not on file  . Food insecurity - worry: Not on file  . Food insecurity - inability: Not on file  . Transportation needs - medical: Not on file  . Transportation needs - non-medical: Not on file  Occupational History  . Not on file  Tobacco Use  . Smoking status: Former Research scientist (life sciences)  . Smokeless tobacco: Never Used  . Tobacco comment: quit 50 yr ago  Substance and Sexual Activity  . Alcohol use: No  . Drug use: No  . Sexual activity: Not on file  Other Topics Concern  . Not on file  Social History Narrative  . Not on file    Past Surgical History:  Procedure Laterality Date  . APPENDECTOMY    . HERNIA REPAIR    . IR GENERIC HISTORICAL  05/04/2016   IR PERCUTANEOUS ART  THROMBECTOMY/INFUSION INTRACRANIAL INC DIAG ANGIO 05/04/2016 [name], MD [facility]  . IR GENERIC HISTORICAL  05/04/2016   IR ANGIO VERTEBRAL SEL SUBCLAVIAN INNOMINATE UNI R MOD SED 05/04/2016 [name], MD [facility]  . IR GENERIC HISTORICAL  06/07/2016   IR RADIOLOGIST EVAL & MGMT 06/07/2016 [facility]  . JOINT REPLACEMENT    . MITRAL VALVE REPAIR    . mvp repair    . TOTAL HIP ARTHROPLASTY      Family History  Problem Relation Age of Onset  . Rheumatic fever Mother   . Coronary artery disease Father     Allergies  Allergen Reactions  . Novocain [Procaine Hcl]     Irregular heart beat     . Penicillins Itching    Has patient had a PCN reaction causing immediate rash, facial/tongue/throat swelling, SOB or lightheadedness with hypotension: Unk Has patient had a PCN reaction causing severe rash involving mucus membranes or skin necrosis: Unk Has patient had a PCN reaction that required hospitalization: Unk Has patient had a PCN reaction occurring within the last 10 years: No If all of the above answers are "NO", then may proceed with Cephalosporin use.     Current Outpatient Medications on File Prior to Visit  Medication Sig Dispense Refill  .  acetaminophen (TYLENOL) 325 MG tablet Take 650 mg by mouth 3 (three) times daily.    Marland Kitchen ALPRAZolam (XANAX) 0.25 MG tablet Take 1 tablet (0.25 mg total) by mouth at bedtime. 60 tablet 0  . apixaban (ELIQUIS) 2.5 MG TABS tablet Take 1 tablet (2.5 mg total) by mouth 2 (two) times daily. 60 tablet 0  . clindamycin (CLEOCIN) 150 MG capsule TAKE 2 CAPSULES BY MOUTH PRIOR TO DENTAL WORK AS DIRECTED 2 capsule 2  . metoprolol succinate (TOPROL-XL) 25 MG 24 hr tablet Take 1 tablet (25 mg total) by mouth daily. (Patient taking differently: Take 25-50 mg by mouth See admin instructions. 25mg  in am, 50mg  in pm) 270 tablet 0  . metoprolol succinate (TOPROL-XL) 25 MG 24 hr tablet TAKE 1 TABLET BY MOUTH EVERY MORNING AND TAKE 2  TABLETS BY MOUTH EVERY EVENING 270 tablet 0   No current facility-administered medications on file prior to visit.     BP 100/62 (BP Location: Left Arm, Patient Position: Sitting, Cuff Size: Normal)   Pulse 65   Temp 98.1 F (36.7 C) (Oral)   SpO2 96%     Review of Systems  Constitutional: Positive for activity change, appetite change and fatigue. Negative for chills and fever.  HENT: Positive for congestion. Negative for dental problem, ear pain, hearing loss, sore throat, tinnitus, trouble swallowing and voice change.   Eyes: Negative for pain, discharge and visual disturbance.  Respiratory: Positive for cough. Negative for chest tightness, wheezing and stridor.   Cardiovascular: Negative for chest pain, palpitations and leg swelling.  Gastrointestinal: Negative for abdominal distention, abdominal pain, blood in stool, constipation, diarrhea, nausea and vomiting.  Genitourinary: Negative for difficulty urinating, discharge, flank pain, genital sores, hematuria and urgency.  Musculoskeletal: Negative for arthralgias, back pain, gait problem, joint swelling, myalgias and neck stiffness.  Skin: Negative for rash.  Neurological: Negative for dizziness, syncope, speech difficulty, weakness, numbness and headaches.  Hematological: Negative for adenopathy. Does not bruise/bleed easily.  Psychiatric/Behavioral: Negative for behavioral problems and dysphoric mood. The patient is not nervous/anxious.        Objective:   Physical Exam  Constitutional: He is oriented to person, place, and time. He appears well-developed. No distress.  Wheelchair-bound Afebrile O2 saturation 96 Pulse rate 65  HENT:  Head: Normocephalic.  Right Ear: External ear normal.  Left Ear: External ear normal.  Eyes: Conjunctivae and EOM are normal.  Neck: Normal range of motion.  Cardiovascular: Normal rate and normal heart sounds.  Rhythm is irregular with a controlled ventricular response  Pulmonary/Chest:  Effort normal and breath sounds normal. No respiratory distress. He has no wheezes. He has no rales.  Abdominal: Bowel sounds are normal.  Musculoskeletal: Normal range of motion. He exhibits no edema or tenderness.  Neurological: He is alert and oriented to person, place, and time.  Chronic left-sided weakness  Psychiatric: He has a normal mood and affect. His behavior is normal.          Assessment & Plan:   Viral URI with cough Atrial fibrillation.  Continue chronic anticoagulation Essential hypertension well-controlled Dyslipidemia continue pravastatin  Nyoka Cowden

## 2017-07-04 ENCOUNTER — Other Ambulatory Visit: Payer: Self-pay | Admitting: Internal Medicine

## 2017-07-19 ENCOUNTER — Telehealth: Payer: Self-pay | Admitting: Internal Medicine

## 2017-07-19 NOTE — Telephone Encounter (Signed)
Pt was made aware.  

## 2017-07-19 NOTE — Telephone Encounter (Signed)
Pt wanting to take a med like Mucinex but not sure if it will interact with his other meds. Son Brian [name] is requesting a call back regarding this @ [phone]. Based on NT looking up drug on [facility]rom[facility], I could see no interactions.

## 2017-07-19 NOTE — Telephone Encounter (Signed)
Sir, please see below message... Please advise.

## 2017-07-19 NOTE — Telephone Encounter (Signed)
Okay for patient to take Mucinex

## 2017-08-04 ENCOUNTER — Other Ambulatory Visit: Payer: Self-pay | Admitting: Internal Medicine

## 2017-08-31 ENCOUNTER — Ambulatory Visit: Payer: Self-pay | Admitting: *Deleted

## 2017-08-31 NOTE — Telephone Encounter (Signed)
Pt states that he has a "real bad cough, runny nose, sore throat"; he also states that his son was in to see the MD today for the same symptoms; he is wondering "if the MD can call him something in"; nurse triage initiated and per recommendation made for pt to see a physician within 24 hours; pt has limited availability because he is in a wheelchair and his caregiver works 11a-5p, his son is also sick, and his son has a hard time getting the pt and his wheelchair in the car; spoke with [place] about scheduling the pt for the Saturday Clinic at [facility]; per Almyra Free it is ok to schedule pt at a time that meets the needs of the pt; pt offered and accepted 1215 appointment with Dr Dimas Chyle at [facility]; he verbalizes understanding.    Reason for Disposition . [1] Continuous (nonstop) coughing interferes with work or school AND [2] no improvement using cough treatment per Care Advice  Answer Assessment - Initial Assessment Questions 1. ONSET: "When did the cough begin?"      Thursday 08/30/17 2. SEVERITY: "How bad is the cough today?"     moderate 3. RESPIRATORY DISTRESS: "Describe your breathing."      "when he takes a deep breath it is not as easy as it ususally is  4. FEVER: "Do you have a fever?" If so, ask: "What is your temperature, how was it measured, and when did it start?"     no 5. SPUTUM: "Describe the color of your sputum" (clear, white, yellow, green)     yellow 6. HEMOPTYSIS: "Are you coughing up any blood?" If so ask: "How much?" (flecks, streaks, tablespoons, etc.)     no 7. CARDIAC HISTORY: "Do you have any history of heart disease?" (e.g., heart attack, congestive heart failure)      Open heart failure, CHF, and CVA ; take eliquis due to afib, mitral valve repair 20 years ago  6. LUNG HISTORY: "Do you have any history of lung disease?"  (e.g., pulmonary embolus, asthma, emphysema)     pneumonia 9. PE RISK FACTORS: "Do you have a history of blood clots?" (or: recent major surgery,  recent prolonged travel, bedridden )     no 10. OTHER SYMPTOMS: "Do you have any other symptoms?" (e.g., runny nose, wheezing, chest pain)       Runny nose 11. PREGNANCY: "Is there any chance you are pregnant?" "When was your last menstrual period?"       n/a 12. TRAVEL: "Have you traveled out of the country in the last month?" (e.g., travel history, exposures)       n/a  Protocols used: [place]

## 2017-09-01 ENCOUNTER — Encounter: Payer: Self-pay | Admitting: Family Medicine

## 2017-09-01 ENCOUNTER — Ambulatory Visit (INDEPENDENT_AMBULATORY_CARE_PROVIDER_SITE_OTHER): Payer: Medicare Other | Admitting: Family Medicine

## 2017-09-01 VITALS — BP 110/76 | HR 111 | Temp 98.7°F | Resp 16 | Ht 64.0 in | Wt 144.0 lb

## 2017-09-01 DIAGNOSIS — R05 Cough: Secondary | ICD-10-CM | POA: Diagnosis not present

## 2017-09-01 DIAGNOSIS — R059 Cough, unspecified: Secondary | ICD-10-CM

## 2017-09-01 MED ORDER — METHYLPREDNISOLONE ACETATE 80 MG/ML IJ SUSP
80.0000 mg | Freq: Once | INTRAMUSCULAR | Status: AC
  Administered 2017-09-01: 80 mg via INTRAMUSCULAR

## 2017-09-01 MED ORDER — GUAIFENESIN-DM 100-10 MG/5ML PO SYRP: 5.0000 mL | ORAL_SOLUTION | ORAL | 0 refills | Status: DC | PRN

## 2017-09-01 MED ORDER — IPRATROPIUM BROMIDE 0.06 % NA SOLN: 2.0000 | Freq: Four times a day (QID) | NASAL | 0 refills | Status: DC

## 2017-09-01 NOTE — Progress Notes (Signed)
Pre visit review using our clinic review tool, if applicable. No additional management support is needed unless otherwise documented below in the visit note. 

## 2017-09-01 NOTE — Patient Instructions (Signed)
Start the atrovent.  Start cough syrup for your cough.  Please stay well hydrated.  You can take tylenol as needed for low grade fever and pain.  Please let me know if your symptoms worsen or fail to improve.  Take care, Dr Jerline Pain

## 2017-09-01 NOTE — Progress Notes (Signed)
    Subjective:  Brian [name] is a 84 y.o. male who presents today for same-day appointment with a chief complaint of cough.   HPI:  Cough, acute issue Started 2 days ago. Worsened over the last couple of days. Associated with chest congestion, sore throat, rhinorrhea, and clear sputum production. Son had similar symptoms within the past week.  Tried cough drops which has not helped.  No obvious alleviating or aggravating symptoms.  No fevers or chills.  No shortness of breath.  No chest pain.   ROS: Per HPI  PMH: He reports that he has quit smoking. he has never used smokeless tobacco. He reports that he does not drink alcohol or use drugs.  Objective:  Physical Exam: BP 110/76 (BP Location: Left Arm, Patient Position: Sitting, Cuff Size: Normal)   Pulse (!) 111   Temp 98.7 F (37.1 C) (Oral)   Resp 16   Ht 5\' 4"  (1.626 m)   Wt 144 lb (65.3 kg)   SpO2 97%   BMI 24.72 kg/m   Gen: NAD, resting comfortably in wheelchair HEENT: TMs clear bilaterally.  Oropharynx erythematous without exudate. CV: Irregular with no murmurs appreciated Pulm: NWOB, CTAB with no crackles, wheezes, or rhonchi  Assessment/Plan:  Cough Likely secondary to viral URI. No signs of bacterial infection or pneumonia-his lungs are clear and he is satting normally on room air. Start atrovent for rhinorrhea/sinus congestion. Will give 80mg  IM depo-medrol for sore throat. Start guaifenesin-dextromethorphan syrup for cough.  Patient is mildly tachycardic (has a history of atrial fibrillation) - may be mildly dehydrated. Encouraged good oral hydration. Return precautions reviewed. Follow up as needed.     Algis Greenhouse. Jerline Pain, MD 09/01/2017 12:37 PM

## 2017-09-06 ENCOUNTER — Ambulatory Visit (INDEPENDENT_AMBULATORY_CARE_PROVIDER_SITE_OTHER): Payer: Medicare Other | Admitting: Family Medicine

## 2017-09-06 ENCOUNTER — Other Ambulatory Visit: Payer: Self-pay | Admitting: Internal Medicine

## 2017-09-06 ENCOUNTER — Encounter: Payer: Self-pay | Admitting: Family Medicine

## 2017-09-06 VITALS — BP 108/60 | HR 90 | Temp 98.4°F | Wt 144.0 lb

## 2017-09-06 DIAGNOSIS — J989 Respiratory disorder, unspecified: Secondary | ICD-10-CM | POA: Diagnosis not present

## 2017-09-06 IMAGING — CT CT HEAD W/O CM
4 series · 16 of 47 positions shown, 18 images · non-contrast
Comparison: Four days ago

CLINICAL DATA: Intracerebral hemorrhage follow-up.  No new symptoms

EXAM:
CT HEAD WITHOUT CONTRAST
TECHNIQUE: Contiguous axial images were obtained from the base of the skull
through the vertex without intravenous contrast.

[Series 2: head without · axial · non-contrast · 0.43mm/px · z∈[+1165,+1275]mm · 7 of 30 slices shown, 9 images]
[im 4/30  brain]
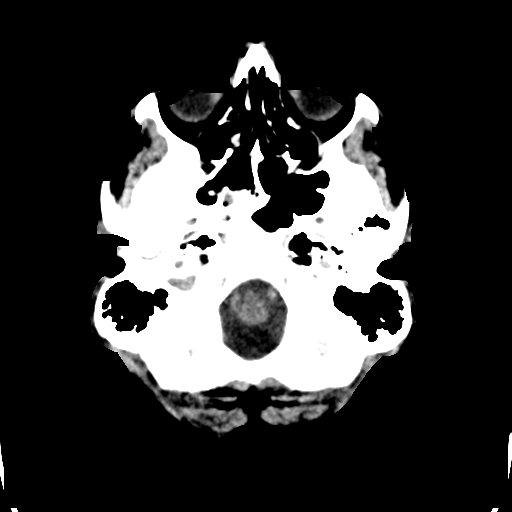
[im 4/30  bone]
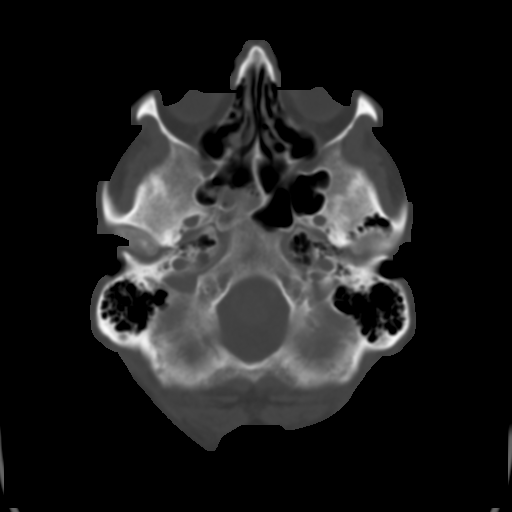
[im 8/30  brain]
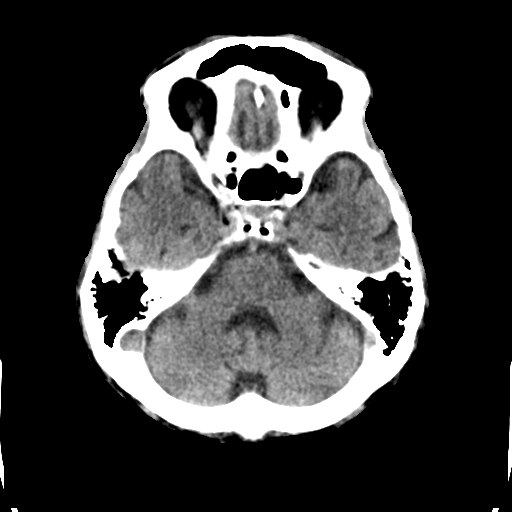
[im 11/30  brain]
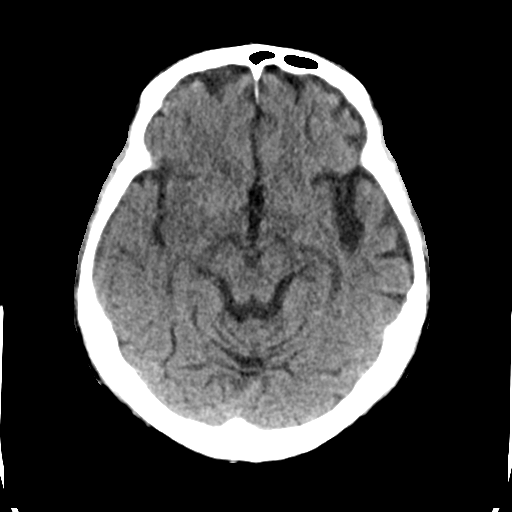
[im 15/30  brain]
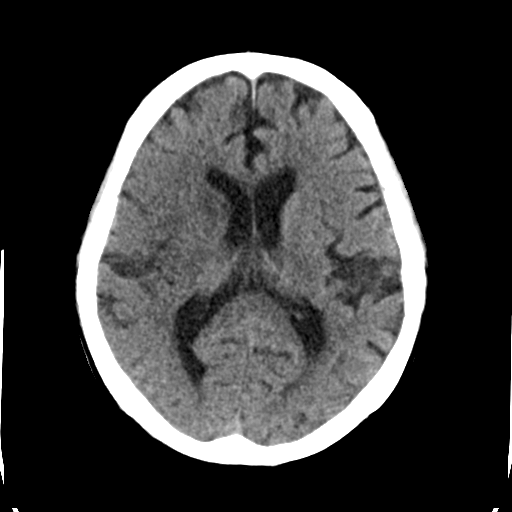
[im 19/30  brain]
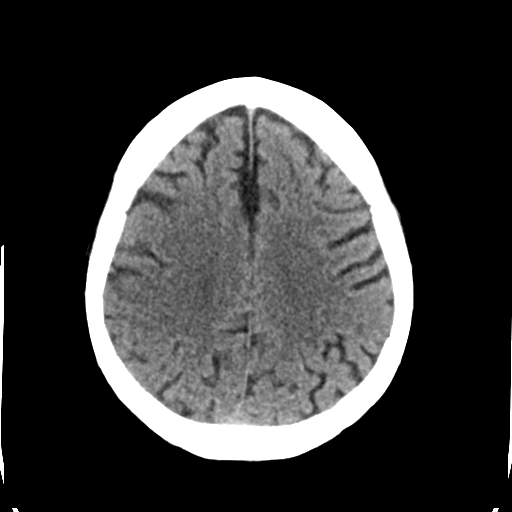
[im 19/30  bone]
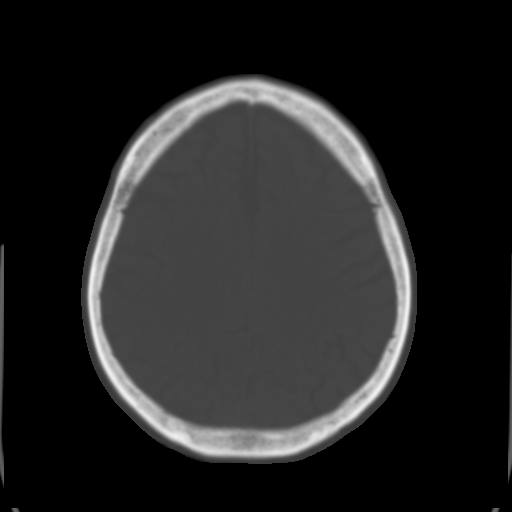
[im 22/30  brain]
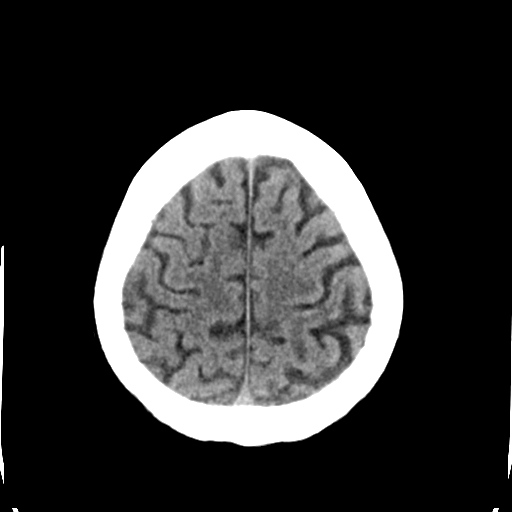
[im 26/30  brain]
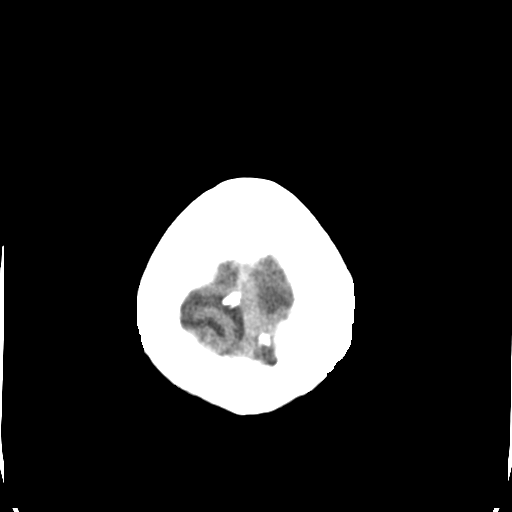

[Series 3: head bone · axial · 0.43mm/px · z∈[+1164,+1192]mm · 3 of 73 slices shown]
[im 8/73  bone]
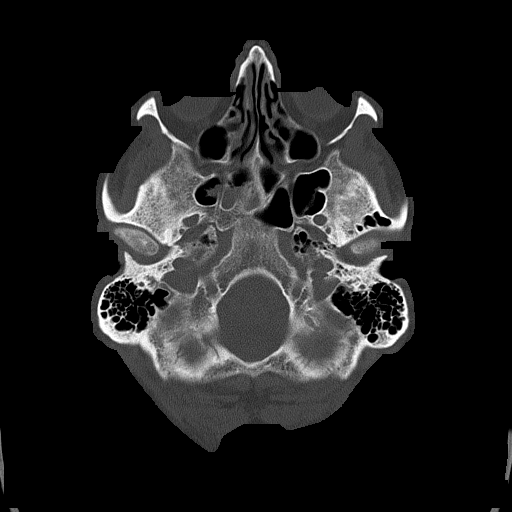
[im 15/73  bone]
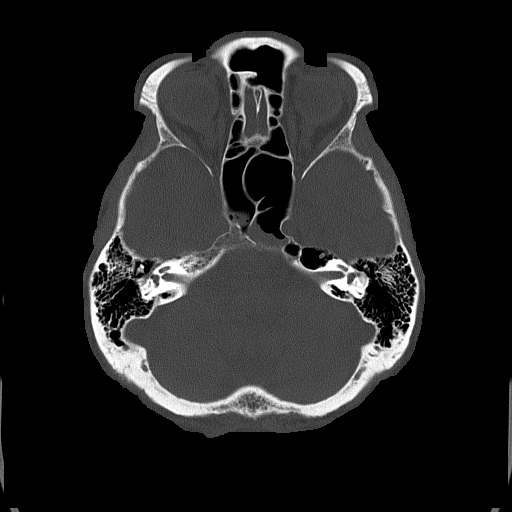
[im 22/73  bone]
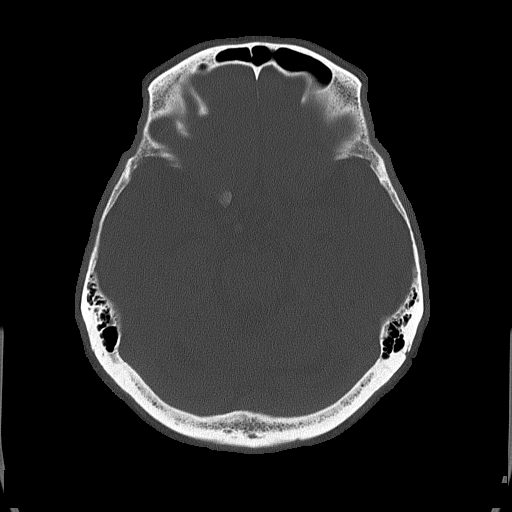

[Series 4: head without cor · coronal · non-contrast · 0.29mm/px · 3 of 61 slices shown]
[im 21/61  brain]
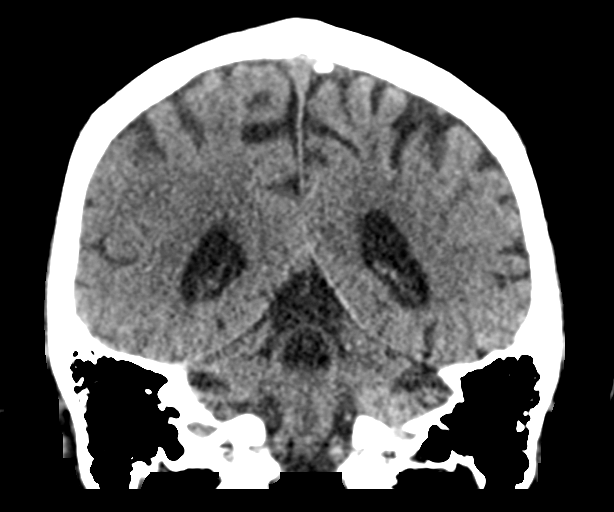
[im 27/61  brain]
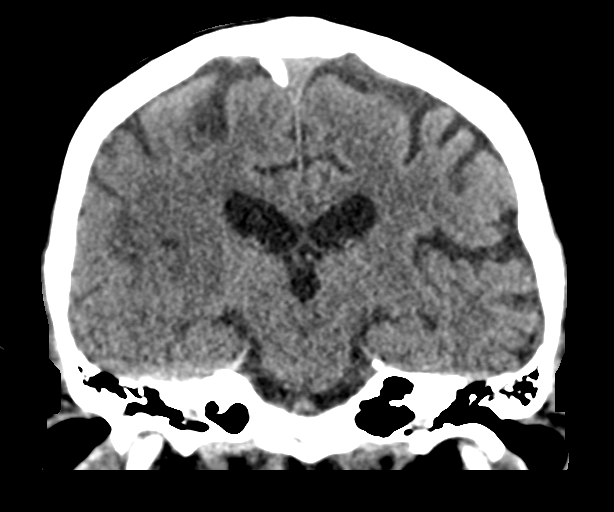
[im 34/61  brain]
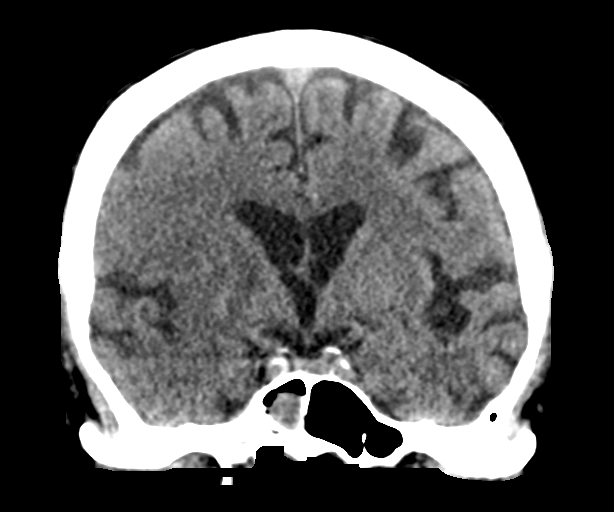

[Series 5: head without sag · sagittal · non-contrast · 0.30mm/px · 3 of 52 slices shown]
[im 18/52  brain]
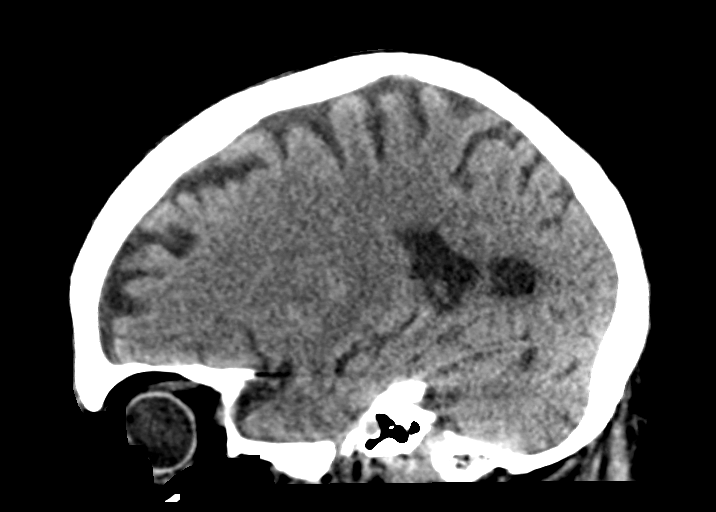
[im 26/52  brain]
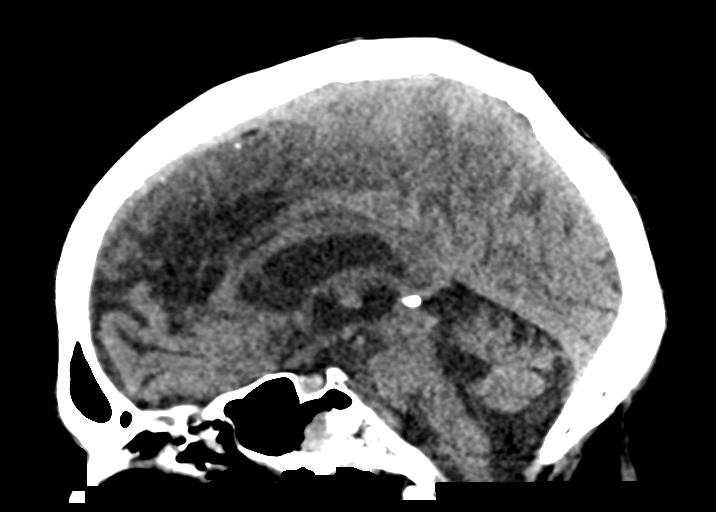
[im 35/52  brain]
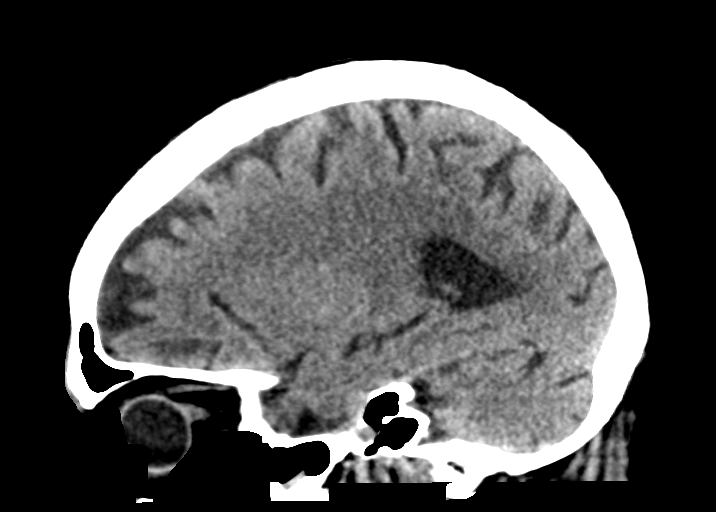

[16 of 47 positions shown; findings below may reference images not displayed]

FINDINGS: Brain: Right basal ganglia infarct with indistinct central
hemorrhagic transformation has a stable appearance and extent.
Infarct involves the caudate head, putamen and intervening white
matter track. No hydrocephalus or shift.

Generalized atrophy.

Vascular: No hyperdense vessel or unexpected calcification.

Skull: Normal. Negative for fracture or focal lesion.

Sinuses/Orbits: Recent intubation with stable secretions in the
sphenoid sinuses. Bilateral cataract resection.

Other: None.
IMPRESSION: Hemorrhagic right basal ganglia infarct is stable from prior. No new
abnormality.

## 2017-09-06 MED ORDER — DOXYCYCLINE HYCLATE 100 MG PO TABS: 100.0000 mg | ORAL_TABLET | Freq: Two times a day (BID) | ORAL | 0 refills | Status: DC

## 2017-09-06 NOTE — Progress Notes (Signed)
HPI:  Acute visit for respiratory illness: -started:7 days ago, saw another provided 1/12 and dx with VURI -symptoms:nasal congestion, sore throat, cough - reports has not gotten better and has productive cough, nasal congestion and feels wheezy at times and wants abx -denies:fever, SOB, NVD, tooth pain, body aches, excessive fatigue, malaise -has tried: nothing -sick contacts/travel/risks: no reported flu, strep or tick exposure -Hx of: denies hx lung disease -reports is allergic to steroids ROS: See pertinent positives and negatives per HPI.  Past Medical History:  Diagnosis Date  . Anemia   . Atrial flutter ([place])   . BPH (benign prostatic hypertrophy)   . Epistaxis   . Hemorrhage of gastrointestinal tract, unspecified   . History of open heart surgery   . Hypertension   . Osteoarthrosis, unspecified whether generalized or localized, unspecified site   . Personal history of venous thrombosis and embolism   . Rosacea   . Stroke ([place])   . TIA (transient ischemic attack)     Past Surgical History:  Procedure Laterality Date  . APPENDECTOMY    . HERNIA REPAIR    . IR GENERIC HISTORICAL  05/04/2016   IR PERCUTANEOUS ART THROMBECTOMY/INFUSION INTRACRANIAL INC DIAG ANGIO 05/04/2016 [name], MD [facility]  . IR GENERIC HISTORICAL  05/04/2016   IR ANGIO VERTEBRAL SEL SUBCLAVIAN INNOMINATE UNI R MOD SED 05/04/2016 [name], MD [facility]  . IR GENERIC HISTORICAL  06/07/2016   IR RADIOLOGIST EVAL & MGMT 06/07/2016 [facility]  . JOINT REPLACEMENT    . MITRAL VALVE REPAIR    . mvp repair    . RADIOLOGY WITH ANESTHESIA N/A 05/04/2016   Procedure: RADIOLOGY WITH ANESTHESIA;  Surgeon: [name], MD;  Location: [facility];  Service: Radiology;  Laterality: N/A;  . TOTAL HIP ARTHROPLASTY      Family History  Problem Relation Age of Onset  . Rheumatic fever Mother   . Coronary artery disease Father     Social History   Socioeconomic History  .  Marital status: Married    Spouse name: None  . Number of children: None  . Years of education: None  . Highest education level: None  Social Needs  . Financial resource strain: None  . Food insecurity - worry: None  . Food insecurity - inability: None  . Transportation needs - medical: None  . Transportation needs - non-medical: None  Occupational History  . None  Tobacco Use  . Smoking status: Former Research scientist (life sciences)  . Smokeless tobacco: Never Used  . Tobacco comment: quit 50 yr ago  Substance and Sexual Activity  . Alcohol use: No  . Drug use: No  . Sexual activity: None  Other Topics Concern  . None  Social History Narrative  . None     Current Outpatient Medications:  .  acetaminophen (TYLENOL) 325 MG tablet, Take 650 mg by mouth 3 (three) times daily., Disp: , Rfl:  .  ALPRAZolam (XANAX) 0.25 MG tablet, Take 1 tablet (0.25 mg total) at bedtime by mouth., Disp: 60 tablet, Rfl: 0 .  apixaban (ELIQUIS) 2.5 MG TABS tablet, Take 1 tablet (2.5 mg total) by mouth 2 (two) times daily., Disp: 60 tablet, Rfl: 0 .  apixaban (ELIQUIS) 2.5 MG TABS tablet, Take 1 tablet (2.5 mg total) 2 (two) times daily by mouth., Disp: 60 tablet, Rfl: 6 .  benazepril-hydrochlorthiazide (LOTENSIN HCT) 5-6.25 MG tablet, Take 1 tablet daily by mouth., Disp: 90 tablet, Rfl: 0 .  ELIQUIS 2.5 MG TABS tablet, TAKE  1 TABLET BY MOUTH TWICE DAILY, Disp: 60 tablet, Rfl: 0 .  finasteride (PROSCAR) 5 MG tablet, Take 1 tablet (5 mg total) daily by mouth., Disp: 90 tablet, Rfl: 3 .  guaiFENesin-dextromethorphan (ROBITUSSIN DM) 100-10 MG/5ML syrup, Take 5 mLs by mouth every 4 (four) hours as needed for cough., Disp: 118 mL, Rfl: 0 .  ipratropium (ATROVENT) 0.06 % nasal spray, Place 2 sprays into both nostrils 4 (four) times daily., Disp: 15 mL, Rfl: 0 .  metoprolol succinate (TOPROL-XL) 25 MG 24 hr tablet, Take 1 tablet (25 mg total) by mouth daily. (Patient taking differently: Take 25-50 mg by mouth See admin instructions.  25mg  in am, 50mg  in pm), Disp: 270 tablet, Rfl: 0 .  metoprolol succinate (TOPROL-XL) 25 MG 24 hr tablet, TAKE 1 TABLET BY MOUTH EVERY MORNING AND TAKE 2 TABLETS BY MOUTH EVERY EVENING, Disp: 270 tablet, Rfl: 0 .  pantoprazole (PROTONIX) 40 MG tablet, Take 1 tablet (40 mg total) daily by mouth., Disp: 90 tablet, Rfl: 3 .  pravastatin (PRAVACHOL) 20 MG tablet, Take 1 tablet (20 mg total) daily by mouth., Disp: 90 tablet, Rfl: 3 .  sertraline (ZOLOFT) 25 MG tablet, Take 1 tablet (25 mg total) daily by mouth., Disp: 30 tablet, Rfl: 5 .  doxycycline (VIBRA-TABS) 100 MG tablet, Take 1 tablet (100 mg total) by mouth 2 (two) times daily., Disp: 14 tablet, Rfl: 0  EXAM:  Vitals:   09/06/17 1315  BP: 108/60  Pulse: 90  Temp: 98.4 F (36.9 C)  SpO2: 97%    Body mass index is 24.72 kg/m.  GENERAL: vitals reviewed and listed above, alert, oriented, appears well hydrated and in no acute distress  HEENT: atraumatic, conjunttiva clear, no obvious abnormalities on inspection of external nose and ears, normal appearance of ear canals and TMs, clear nasal congestion, mild post oropharyngeal erythema with PND, no tonsillar edema or exudate, no sinus TTP  NECK: no obvious masses on inspection  LUNGS: Questionable mild rhonchi on the right base, otherwise normal exam, no signs of respiratory distress, normal O2 sats  CV: irr irr, no peripheral edema  MS: moves all extremities without noticeable abnormality  PSYCH: pleasant and cooperative, no obvious depression or anxiety  ASSESSMENT AND PLAN:  Discussed the following assessment and plan:  Respiratory illness  -we discussed possible serious and likely etiologies, workup and treatment, treatment risks and return precautions -viral respiratory illness most likely, possible lower respiratory infection -Advised chest x-ray, he prefers to try starting an antibiotic and get chest x-ray if does not improve -of course, we advised Tasha  to return or  notify a doctor immediately if symptoms worsen or persist or new concerns arise.     Patient Instructions  Take the antibiotic as instructed starting today.  I hope you are feeling better soon! Seek care promptly if your symptoms worsen, new concerns arise or you are not improving with treatment.     Colin Benton R., DO

## 2017-09-06 NOTE — Patient Instructions (Signed)
Take the antibiotic as instructed starting today.  I hope you are feeling better soon! Seek care promptly if your symptoms worsen, new concerns arise or you are not improving with treatment.

## 2017-09-07 ENCOUNTER — Telehealth: Payer: Self-pay | Admitting: Internal Medicine

## 2017-09-07 NOTE — Telephone Encounter (Signed)
Dr. Truddie Hidden pt requesting a stronger antibiotic.

## 2017-09-07 NOTE — Telephone Encounter (Signed)
Copied from [place] [id number]. Topic: Quick Communication - See Telephone Encounter >> Sep 07, 2017 10:40 AM Synthia Innocent wrote: CRM for notification. See Telephone encounter for: patient is requesting stronger antibiotics, state its not working [employer] please advise  09/07/17.

## 2017-09-08 ENCOUNTER — Other Ambulatory Visit: Payer: Self-pay | Admitting: Family Medicine

## 2017-09-10 NOTE — Telephone Encounter (Signed)
Please advise 

## 2017-09-10 NOTE — Telephone Encounter (Signed)
Pt's daughter [name], called in to follow up on request. She said that they were advised by Dr. To call in if needed.   Please advise   CB: [phone]

## 2017-09-10 NOTE — Telephone Encounter (Signed)
Patient called and discussed.  Patient will call if there is any clinical deterioration or fever.  No further antibiotic felt indicated at this time

## 2017-09-13 ENCOUNTER — Telehealth: Payer: Self-pay | Admitting: Cardiovascular Disease

## 2017-09-13 ENCOUNTER — Telehealth: Payer: Self-pay | Admitting: Internal Medicine

## 2017-09-13 NOTE — Telephone Encounter (Signed)
[name] is calling to see if Dr. Oval Linsey can prescribe something for pain for his Arthritis and also recommend a Rheumologist in which he can see. Please Call

## 2017-09-13 NOTE — Telephone Encounter (Signed)
Ok for referral?

## 2017-09-13 NOTE — Telephone Encounter (Signed)
Spoke with patient and advised pain medication would need to come from PCP. He stated that his orthopedic had given him medication and he was worried since it said could elevate blood pressure and HR. Advised patient he could call back with name of medication and can have Dr Oval Linsey review with other medications to make sure ok to take. Gave him name of [facility] to call but if needs referral contact PCP.

## 2017-09-13 NOTE — Telephone Encounter (Signed)
Copied from [place] [phone]. Topic: Referral - Request >> Sep 13, 2017  4:15 PM Darl Householder, RMA wrote: Reason for CRM: patient is requesting a referral for rheumatology in the [place] area

## 2017-09-13 NOTE — Telephone Encounter (Signed)
Please advise 

## 2017-09-13 NOTE — Telephone Encounter (Signed)
Copied from [place] [id number]. Topic: Quick Communication - See Telephone Encounter >> Sep 13, 2017  1:32 PM Aurelio Brash B wrote: CRM for notification. See Telephone encounter for:  Pt is requesting the Dr to call him in a rx  for his arthritis   [facility] [place] [address] [phone] (Phone) [phone] (Fax)   09/13/17.

## 2017-09-14 ENCOUNTER — Telehealth: Payer: Self-pay | Admitting: *Deleted

## 2017-09-14 NOTE — Telephone Encounter (Signed)
Copied from [place] [phone]. Topic: General - Other >> Sep 14, 2017  1:00 PM Yvette Rack wrote: Reason for CRM: patient calling stating that his pulse use to run between 60-80 but now since he's been taking one pill a day of benazepril-hydrochlorthiazide (LOTENSIN HCT) 5-6.25 MG tablet his pulse been running 110-114 daily and want to know if this is because of the medicine

## 2017-09-14 NOTE — Telephone Encounter (Signed)
See below message please advise.

## 2017-09-17 ENCOUNTER — Telehealth: Payer: Self-pay | Admitting: Cardiovascular Disease

## 2017-09-17 NOTE — Telephone Encounter (Signed)
Can we ask Dr. Sherren Mocha about this? I tried to reach pt to get more information but not answer.

## 2017-09-17 NOTE — Telephone Encounter (Signed)
Called patient, he is not available at this time, he took his dog to the vet. They are asking for [place] to call back this afternoon.

## 2017-09-17 NOTE — Telephone Encounter (Signed)
Per Dr. Sherren Mocha, benazapril should not cause increased HR. Patient does have h/o AFIB, which could be contributing to increased rate. Patient should contact his cardiologist for follow-up.  Pt notified of instructions and verbalized understanding. On further review of medication list, patient is NOT taking benazapril-HCTZ, but is taking metoprolol. He will contact cardiologist as directed, phone number provided.

## 2017-09-17 NOTE — Telephone Encounter (Signed)
Please call,pt blood pressure  is good, now his pulse rate is running high,it is 114It

## 2017-09-17 NOTE — Telephone Encounter (Signed)
Pt's daughter calling in checking on referral status.

## 2017-09-17 NOTE — Telephone Encounter (Signed)
Spoke with pt, he has stopped the benazepril-hctz and the metoprolol was decreased by dr Raliegh Ip to 1 tab in the am and 1 tab in the pm when it had been 1 in the am and 2 in the pm. Since the decrease in the metoprolol he has noticed on his 02 sat monitor his heart rate will go up to 110-114 bpm and stay there. His bp right now is 113/67 with pulse of 86. He is wanting to know if he needs to increase the metoprolol back to the previous dose. He can not tell me how low his bp got for the change. Will forward to dr Oval Linsey to review and advise.

## 2017-09-19 ENCOUNTER — Other Ambulatory Visit: Payer: Self-pay | Admitting: Family Medicine

## 2017-09-19 NOTE — Telephone Encounter (Signed)
Dr K pt 

## 2017-09-20 ENCOUNTER — Ambulatory Visit: Payer: Self-pay | Admitting: *Deleted

## 2017-09-20 NOTE — Telephone Encounter (Signed)
Pt says his blood pressure is still running high,he wants to know about taking his blood pressure medicine.His pulse rate today is 102

## 2017-09-20 NOTE — Telephone Encounter (Signed)
Pt would like a prescription for Tramadol. Medication not in pt's current med list Pt would like for the medication to be sent to [facility] on [address] in [place].

## 2017-09-20 NOTE — Telephone Encounter (Signed)
Spoke with pt, he is reporting now that he is not taking any metoprolol at night. His bp right now is 109/54. Patient was given the okay to take metoprolol twice daily, in the morning and in the evening. He was also given the okay to take tramadol from the orthopaedic.

## 2017-09-20 NOTE — Telephone Encounter (Signed)
Pt asking if he could be prescribed Tramadol for arthritis pain. Pt states he spoke to a cardiologist and it was recommended to try tramadol for pain because it would not intefere with the current medications he is prescribed.Pt states he has pain in his knee, shoulder,elbow and toes from arthritis that he has been experiencing for a while.  Pt states he was was supposed to be referred to a doctor to treat arthritis, but has not been to an appt. If Tramadol is prescribed the pt would like for the medication to be sent to [facility] in [place], on [address].

## 2017-09-21 NOTE — Telephone Encounter (Signed)
Spoke with pt and advised him that Dr. Raliegh Ip is not in office this week or next. He said he is fine to wait on his return.   Dr. Raliegh Ip - Please advise on referral and meds. Thanks!

## 2017-09-21 NOTE — Telephone Encounter (Signed)
I do not see a referral to rheumatology.  If he has discussed this with Dr. Burnice Logan, referral can be placed.  I am not sure about diagnosis, ? OA. In regard to Tramadol prescription, he will need to discuss this with either Dr. Burnice Logan or rheumatologist.  Thanks, BJ

## 2017-09-21 NOTE — Telephone Encounter (Signed)
Pt is calling back about referral to Rheum and about getting Tramadol until he can be seen.  Dr. Raliegh Ip is out of office.  Dr. Martinique - Please advise. Thanks!

## 2017-09-21 NOTE — Telephone Encounter (Signed)
See TE dated 09/13/17 for further message details.

## 2017-09-24 ENCOUNTER — Telehealth: Payer: Self-pay | Admitting: Internal Medicine

## 2017-09-24 NOTE — Telephone Encounter (Signed)
Copied from [place]. Topic: General - Other >> Sep 24, 2017  1:58 PM Lolita Rieger, [place] wrote: Reason for CRM: pt [name] called and has a few questions about medications please call [phone]  or [phone]

## 2017-09-25 ENCOUNTER — Other Ambulatory Visit: Payer: Self-pay | Admitting: Internal Medicine

## 2017-09-26 NOTE — Telephone Encounter (Signed)
I spoke with daughter and explained that provider is out of the office, offered to schedule patient to see another provider here in the office if patient could not wait, daughter declined for pt.

## 2017-09-26 NOTE — Telephone Encounter (Signed)
Daughter calling to request the rheumatology referral/appt asap. She states pt is in a lot of pain.  She has called several times and would like to proceed quickly at this point.  Brian [name]

## 2017-09-28 NOTE — Telephone Encounter (Signed)
Please note

## 2017-09-29 ENCOUNTER — Other Ambulatory Visit: Payer: Self-pay | Admitting: Internal Medicine

## 2017-09-30 ENCOUNTER — Other Ambulatory Visit: Payer: Self-pay | Admitting: Internal Medicine

## 2017-09-30 NOTE — Telephone Encounter (Signed)
Patient is on Eliquis and anti-inflammatory medications are discouraged; Tylenol 650 mg 3 times daily for arthritis pain

## 2017-09-30 NOTE — Telephone Encounter (Signed)
Okay for a new prescription for tramadol 50 mg #60 1 tablet twice daily

## 2017-10-01 ENCOUNTER — Other Ambulatory Visit: Payer: Self-pay | Admitting: Internal Medicine

## 2017-10-01 MED ORDER — TRAMADOL HCL 50 MG PO TABS: 50.0000 mg | ORAL_TABLET | Freq: Two times a day (BID) | ORAL | 0 refills | Status: DC

## 2017-10-01 NOTE — Telephone Encounter (Signed)
Rx printed and faxed to the pharmacy.

## 2017-10-01 NOTE — Telephone Encounter (Signed)
[place] for rheum referral? Please advise dx.

## 2017-10-01 NOTE — Telephone Encounter (Signed)
Copied from [place]. Topic: Referral - Request >> Oct 01, 2017 11:19 AM  [place] wrote:  Pt daughter called and is asking for a referral to see a rheumatologist  she said she has been trying since last week to get this done    [phone]

## 2017-10-01 NOTE — Telephone Encounter (Signed)
Tramadol rx faxed to pharmacy per another phone note where pt requested this medication.

## 2017-10-01 NOTE — Telephone Encounter (Signed)
Pt's daughter notified of instructions and verbalized understanding. She will contact the pt and let him know of recommendation for orthopedic eval and will call back if pt needs a new referral. She requested tramadol rx be faxed to [facility] on [address]. Rx faxed, fax confirmation received.

## 2017-10-01 NOTE — Telephone Encounter (Signed)
Patient has significant osteoarthritis.  Suggest follow-up orthopedics surgeon rather than rheumatology

## 2017-10-01 NOTE — Telephone Encounter (Signed)
Note.  Tramadol prescription ready for pickup

## 2017-10-18 ENCOUNTER — Telehealth: Payer: Self-pay | Admitting: Cardiovascular Disease

## 2017-10-18 DIAGNOSIS — R070 Pain in throat: Secondary | ICD-10-CM | POA: Diagnosis not present

## 2017-10-18 DIAGNOSIS — Z8673 Personal history of transient ischemic attack (TIA), and cerebral infarction without residual deficits: Secondary | ICD-10-CM | POA: Insufficient documentation

## 2017-10-18 DIAGNOSIS — R0982 Postnasal drip: Secondary | ICD-10-CM | POA: Diagnosis not present

## 2017-10-18 NOTE — Telephone Encounter (Signed)
Left message for patient, okay to take the plain, avoid the loratadine d.

## 2017-10-18 NOTE — Telephone Encounter (Signed)
NEW MESSAGE     Patient would like to know if it is safe for him to take Loratadine while taking heart medications. Please call   Pt c/o medication issue:  1. Name of Medication: Loratadine  2. How are you currently taking this medication (dosage and times per day)? n/a  3. Are you having a reaction (difficulty breathing--STAT)? No  4. What is your medication issue? Patient would like to know if it is safe for him to take Loratadine while taking heart medications

## 2017-10-22 ENCOUNTER — Telehealth: Payer: Self-pay | Admitting: *Deleted

## 2017-10-22 NOTE — Telephone Encounter (Signed)
Prior auth for Tramadol 50mg  sent to [email or web address].

## 2017-10-23 ENCOUNTER — Telehealth: Payer: Self-pay | Admitting: Internal Medicine

## 2017-10-23 DIAGNOSIS — M1712 Unilateral primary osteoarthritis, left knee: Secondary | ICD-10-CM | POA: Diagnosis not present

## 2017-10-23 DIAGNOSIS — M79672 Pain in left foot: Secondary | ICD-10-CM | POA: Insufficient documentation

## 2017-10-23 NOTE — Telephone Encounter (Signed)
Duplicated phone encounter. See encounter dated for yesterday. Will close this note.

## 2017-10-23 NOTE — Telephone Encounter (Signed)
Copied from [place] [id number]. Topic: Quick Communication - See Telephone Encounter >> Oct 23, 2017 11:51 AM Lolita Rieger, RMA wrote: CRM for notification. See Telephone encounter for:   10/23/17 Jacqlyn Larsen from [facility] [place] called and requested some clinical information  Please call [phone] option 5

## 2017-10-23 NOTE — Telephone Encounter (Signed)
Copied from [place] [id number]. Topic: Quick Communication - See Telephone Encounter >> Oct 23, 2017 11:51 AM Lolita Rieger, RMA wrote: CRM for notification. See Telephone encounter for:   10/23/17 Jacqlyn Larsen from [facility] called and requested some clinical information  Please call [phone] option 5

## 2017-10-24 ENCOUNTER — Telehealth: Payer: Self-pay | Admitting: Internal Medicine

## 2017-10-24 NOTE — Telephone Encounter (Signed)
Copied from [place] [phone]. Topic: Quick Communication - See Telephone Encounter >> Oct 24, 2017 12:03 PM Arletha Grippe wrote: CRM for notification. See Telephone encounter for:   10/24/17. Malcolm from [employer] called - need info for prior auth for traMADol (ULTRAM) 50 MG tablet They need to know: can the quantity be lessened or does pt need the amount requested.  They will need that answer in writing.  They need an explanation faxed to them Cb [phone] option 5    Fax [phone]

## 2017-10-25 ENCOUNTER — Other Ambulatory Visit: Payer: Self-pay | Admitting: Internal Medicine

## 2017-10-25 NOTE — Telephone Encounter (Signed)
Spoke to [employer] from [place].  Prior Auth is not needed for this patient due to quantity.  There is a limit of 240 tabs for 30 days.  Pt only gets 60 tabs.  Pt has a paid claim from [facility] on 10/23/17.  No further action is required.  Will forward as FYI to Wendie Simmer and Tillie Rung.

## 2017-10-25 NOTE — Telephone Encounter (Signed)
Noted  

## 2017-10-25 NOTE — Telephone Encounter (Signed)
Left message for patient to return phone call.  

## 2017-10-25 NOTE — Telephone Encounter (Signed)
Relation to TC:YELY Call back number: [phone] Pharmacy: [facility] [place] [phone] (Phone) [phone] (Fax)     Reason for call:  Patient states he's experiencing severe knee pain and states he cant hardly work and would like PCP to prescribe something stronger then traMADol (ULTRAM) 50 MG tablet, please advise

## 2017-10-26 NOTE — Telephone Encounter (Signed)
Sent to the pharmacy by e-scribe for 3 months.  Pt has upcoming appt with Dr. Raliegh Ip on 10/31/17.

## 2017-10-29 NOTE — Telephone Encounter (Signed)
Spoke with patients wife and she stated that her husband is at the doctors office. Patient wife gave me her husband mobile  number and I also left her a message to let him know to return phone call.

## 2017-10-30 ENCOUNTER — Telehealth: Payer: Self-pay | Admitting: Internal Medicine

## 2017-10-30 NOTE — Telephone Encounter (Signed)
Copied from [place] [id number]. Topic: Quick Communication - See Telephone Encounter >> Oct 30, 2017  4:51 PM Antonieta Iba C wrote: CRM for notification. See Telephone encounter for: pt's daughter called in to request a transport chair with removable arms for pt.   CB: [phone]   [name] - daughter   10/30/17.

## 2017-10-30 NOTE — Telephone Encounter (Signed)
Spoke with patient and he will see if he can get a caretaker to bring him as soon as possible. Patient states that he is only able to get help on Tuesdays. Patient was notified that Dr.Kwiatkowki schedule is booked up till mid April and patient said that he will be able to tolerate waiting that long if he needs to. Patient will return phone all as soon as possible.

## 2017-10-31 ENCOUNTER — Encounter (HOSPITAL_COMMUNITY): Payer: Self-pay | Admitting: Emergency Medicine

## 2017-10-31 ENCOUNTER — Emergency Department (HOSPITAL_COMMUNITY)
Admission: EM | Admit: 2017-10-31 | Discharge: 2017-10-31 | Disposition: A | Discharge status: Home / Self Care | Payer: Medicare Other | Attending: Emergency Medicine | Admitting: Emergency Medicine

## 2017-10-31 ENCOUNTER — Other Ambulatory Visit: Payer: Self-pay

## 2017-10-31 ENCOUNTER — Emergency Department (HOSPITAL_COMMUNITY): Payer: Medicare Other

## 2017-10-31 DIAGNOSIS — Y998 Other external cause status: Secondary | ICD-10-CM | POA: Diagnosis not present

## 2017-10-31 DIAGNOSIS — Z8673 Personal history of transient ischemic attack (TIA), and cerebral infarction without residual deficits: Secondary | ICD-10-CM | POA: Insufficient documentation

## 2017-10-31 DIAGNOSIS — Y93F1 Activity, caregiving, bathing: Secondary | ICD-10-CM | POA: Insufficient documentation

## 2017-10-31 DIAGNOSIS — Z87891 Personal history of nicotine dependence: Secondary | ICD-10-CM | POA: Insufficient documentation

## 2017-10-31 DIAGNOSIS — D68318 Other hemorrhagic disorder due to intrinsic circulating anticoagulants, antibodies, or inhibitors: Secondary | ICD-10-CM | POA: Diagnosis not present

## 2017-10-31 DIAGNOSIS — I2510 Atherosclerotic heart disease of native coronary artery without angina pectoris: Secondary | ICD-10-CM | POA: Diagnosis not present

## 2017-10-31 DIAGNOSIS — I10 Essential (primary) hypertension: Secondary | ICD-10-CM | POA: Diagnosis not present

## 2017-10-31 DIAGNOSIS — W010XXA Fall on same level from slipping, tripping and stumbling without subsequent striking against object, initial encounter: Secondary | ICD-10-CM

## 2017-10-31 DIAGNOSIS — M542 Cervicalgia: Secondary | ICD-10-CM | POA: Insufficient documentation

## 2017-10-31 DIAGNOSIS — W01198A Fall on same level from slipping, tripping and stumbling with subsequent striking against other object, initial encounter: Secondary | ICD-10-CM | POA: Insufficient documentation

## 2017-10-31 DIAGNOSIS — S0990XA Unspecified injury of head, initial encounter: Secondary | ICD-10-CM | POA: Diagnosis not present

## 2017-10-31 DIAGNOSIS — R110 Nausea: Secondary | ICD-10-CM | POA: Diagnosis not present

## 2017-10-31 DIAGNOSIS — S199XXA Unspecified injury of neck, initial encounter: Secondary | ICD-10-CM | POA: Diagnosis not present

## 2017-10-31 DIAGNOSIS — Y92002 Bathroom of unspecified non-institutional (private) residence single-family (private) house as the place of occurrence of the external cause: Secondary | ICD-10-CM | POA: Insufficient documentation

## 2017-10-31 DIAGNOSIS — S4991XA Unspecified injury of right shoulder and upper arm, initial encounter: Secondary | ICD-10-CM | POA: Diagnosis present

## 2017-10-31 DIAGNOSIS — T148XXA Other injury of unspecified body region, initial encounter: Secondary | ICD-10-CM | POA: Diagnosis not present

## 2017-10-31 DIAGNOSIS — S40011A Contusion of right shoulder, initial encounter: Secondary | ICD-10-CM | POA: Insufficient documentation

## 2017-10-31 DIAGNOSIS — S0003XA Contusion of scalp, initial encounter: Secondary | ICD-10-CM | POA: Insufficient documentation

## 2017-10-31 DIAGNOSIS — M25511 Pain in right shoulder: Secondary | ICD-10-CM | POA: Diagnosis not present

## 2017-10-31 DIAGNOSIS — Z7901 Long term (current) use of anticoagulants: Secondary | ICD-10-CM | POA: Diagnosis not present

## 2017-10-31 MED ORDER — TRAMADOL HCL 50 MG PO TABS: 50.0000 mg | ORAL_TABLET | Freq: Four times a day (QID) | ORAL | 0 refills | Status: DC | PRN

## 2017-10-31 MED ORDER — TRAMADOL HCL 50 MG PO TABS
50.0000 mg | ORAL_TABLET | Freq: Once | ORAL | Status: AC
  Administered 2017-10-31: 50 mg via ORAL
  Filled 2017-10-31: qty 1

## 2017-10-31 NOTE — ED Triage Notes (Signed)
Per EMS pt is from home where he lives with wife and son. Fell approximately 8 hours ago going to bathroom and struck the right side of his head, right shoulder.  EMS came out at that time but did not transport. Pt tried to sleep but pain was too great. Pt is on Eliquis.  Pt has pain in right shoulder , right neck, and right side of head. No obvious deformity or injury.  Pt uses a walker to get to the bathroom but is primairly wheelchair bound following a stroke last year.

## 2017-10-31 NOTE — ED Provider Notes (Signed)
[facility] EMERGENCY DEPARTMENT Provider Note   CSN: [id number] Arrival date & time: 10/31/17  0542     History   Chief Complaint Chief Complaint  Patient presents with  . Fall    HPI Brian [name] is a 84 y.o. male.  The history is provided by the patient.  He has history of stroke, hypertension, atrial flutter, coronary artery disease and is anticoagulated on apixaban.  He fell in the shower and hit his head and right shoulder.  He denies loss of consciousness.  There has been some mild nausea but no vomiting.  He denies dizziness or incoordination.  His main complaint now is pain in his right shoulder, although is also complaining of some pain in his neck.  He had apparently initially refused transport by EMS, but came in because of worsening pain in his shoulder.  He denies other injury.  He is unable to put a number on his pain, but states that it is real bad.  Past Medical History:  Diagnosis Date  . Anemia   . Atrial flutter ([place])   . BPH (benign prostatic hypertrophy)   . Epistaxis   . Hemorrhage of gastrointestinal tract, unspecified   . History of open heart surgery   . Hypertension   . Osteoarthrosis, unspecified whether generalized or localized, unspecified site   . Personal history of venous thrombosis and embolism   . Rosacea   . Stroke ([place])   . TIA (transient ischemic attack)     Patient Active Problem List   Diagnosis Date Noted  . CAP (community acquired pneumonia) 03/13/2017  . Cerebrovascular accident (CVA) due to thrombosis of right middle cerebral artery ([place])   . Coronary artery disease involving native coronary artery of native heart without angina pectoris   . H/O mitral valve repair   . Paroxysmal atrial fibrillation (HCC)   . Dysarthria, post-stroke   . Dysphagia, post-stroke   . Cerebrovascular accident (CVA) due to thrombosis of precerebral artery ([place])   . Scoliosis (and kyphoscoliosis), idiopathic 01/26/2014  .  ROTATOR CUFF SYNDROME, LEFT 03/07/2010  . OTH MALIG NEOPLASM SKIN OTH&UNSPEC PARTS FACE 12/06/2009  . ACTINIC KERATOSIS, HEAD 09/06/2009  . Osteoarthritis 02/20/2009  . MITRAL VALVE REPLACEMENT, HX OF 02/20/2009  . HIP REPLACEMENT, TOTAL, HX OF 02/20/2009  . APPENDECTOMY, HX OF 02/20/2009  . HERNIORRHAPHY, HX OF 02/20/2009  . EPISTAXIS, RECURRENT 11/19/2008  . UNS ADVRS EFF UNS RX MEDICINAL&BIOLOGICAL SBSTNC 06/12/2008  . OTHER AND UNSPECIFIED MITRAL VALVE DISEASES 03/12/2008  . ATRIAL FLUTTER 03/12/2008  . Essential hypertension 03/06/2007  . PEPTIC ULCER DISEASE 03/06/2007  . BENIGN PROSTATIC HYPERTROPHY 03/06/2007  . Coronary atherosclerosis 02/13/2007  . ROSACEA 02/13/2007  . DVT, HX OF 02/13/2007    Past Surgical History:  Procedure Laterality Date  . APPENDECTOMY    . HERNIA REPAIR    . IR GENERIC HISTORICAL  05/04/2016   IR PERCUTANEOUS ART THROMBECTOMY/INFUSION INTRACRANIAL INC DIAG ANGIO 05/04/2016 [name], MD [facility]  . IR GENERIC HISTORICAL  05/04/2016   IR ANGIO VERTEBRAL SEL SUBCLAVIAN INNOMINATE UNI R MOD SED 05/04/2016 [name], MD [facility]  . IR GENERIC HISTORICAL  06/07/2016   IR RADIOLOGIST EVAL & MGMT 06/07/2016 [facility]  . JOINT REPLACEMENT    . MITRAL VALVE REPAIR    . mvp repair    . RADIOLOGY WITH ANESTHESIA N/A 05/04/2016   Procedure: RADIOLOGY WITH ANESTHESIA;  Surgeon: [name], MD;  Location: [facility];  Service: Radiology;  Laterality:  N/A;  . TOTAL HIP ARTHROPLASTY         Home Medications    Prior to Admission medications   Medication Sig Start Date End Date Taking? Authorizing Provider  acetaminophen (TYLENOL) 325 MG tablet Take 650 mg by mouth 3 (three) times daily.    [provider]  ALPRAZolam Duanne Moron) 0.25 MG tablet TAKE 1 TABLET BY MOUTH AT BEDTIME 09/10/17   Marletta Lor, MD  apixaban (ELIQUIS) 2.5 MG TABS tablet Take 1 tablet (2.5 mg total) by mouth 2 (two) times daily. 01/16/17    Marletta Lor, MD  apixaban Arne Cleveland) 2.5 MG TABS tablet Take 1 tablet (2.5 mg total) 2 (two) times daily by mouth. 07/03/17   Marletta Lor, MD  benazepril-hydrochlorthiazide (LOTENSIN HCT) 5-6.25 MG tablet Take 1 tablet daily by mouth. Patient not taking: Reported on 09/17/2017 07/03/17   Marletta Lor, MD  benazepril-hydrochlorthiazide (LOTENSIN HCT) 5-6.25 MG tablet TAKE 1 TABLET BY MOUTH DAILY 10/01/17   Marletta Lor, MD  doxycycline (VIBRA-TABS) 100 MG tablet Take 1 tablet (100 mg total) by mouth 2 (two) times daily. 09/06/17   Lucretia Kern, DO  doxycycline (VIBRA-TABS) 100 MG tablet TAKE 1 TABLET(100 MG) BY MOUTH DAILY 10/26/17   Marletta Lor, MD  ELIQUIS 2.5 MG TABS tablet TAKE 1 TABLET BY MOUTH TWICE DAILY 07/04/17   Marletta Lor, MD  finasteride (PROSCAR) 5 MG tablet Take 1 tablet (5 mg total) daily by mouth. 07/03/17   Marletta Lor, MD  guaiFENesin-dextromethorphan (ROBITUSSIN DM) 100-10 MG/5ML syrup Take 5 mLs by mouth every 4 (four) hours as needed for cough. 09/01/17   Vivi Barrack, MD  ipratropium (ATROVENT) 0.06 % nasal spray USE 2 SPRAYS IN EACH NOSTRIL FOUR TIMES DAILY 09/12/17   Vivi Barrack, MD  metoprolol succinate (TOPROL-XL) 25 MG 24 hr tablet Take 1 tablet (25 mg total) by mouth daily. Patient taking differently: Take 25-50 mg by mouth See admin instructions. 25mg  in am, 50mg  in pm 01/16/17   Marletta Lor, MD  metoprolol succinate (TOPROL-XL) 25 MG 24 hr tablet TAKE 1 TABLET BY MOUTH EVERY MORNING AND TAKE 2 TABLETS BY MOUTH EVERY EVENING 10/01/17   Marletta Lor, MD  pantoprazole (PROTONIX) 40 MG tablet Take 1 tablet (40 mg total) daily by mouth. 07/03/17   Marletta Lor, MD  pravastatin (PRAVACHOL) 20 MG tablet Take 1 tablet (20 mg total) daily by mouth. 07/03/17   Marletta Lor, MD  sertraline (ZOLOFT) 25 MG tablet Take 1 tablet (25 mg total) daily by mouth. 07/03/17   Marletta Lor, MD    traMADol (ULTRAM) 50 MG tablet Take 1 tablet (50 mg total) by mouth 2 (two) times daily. 10/01/17   Marletta Lor, MD    Family History Family History  Problem Relation Age of Onset  . Rheumatic fever Mother   . Coronary artery disease Father     Social History Social History   Tobacco Use  . Smoking status: Former Research scientist (life sciences)  . Smokeless tobacco: Never Used  . Tobacco comment: quit 35 yr ago  Substance Use Topics  . Alcohol use: No  . Drug use: No     Allergies   Novocain [procaine hcl] and Penicillins   Review of Systems Review of Systems  All other systems reviewed and are negative.    Physical Exam Updated Vital Signs BP 135/85 (BP Location: Left Arm)   Pulse (!) 110   Temp 98.7 F (  37.1 C) (Oral)   Resp 16   Ht 5\' 6"  (1.676 m)   Wt 70.3 kg (155 lb)   SpO2 97%   BMI 25.02 kg/m   Physical Exam  Nursing note and vitals reviewed.  84 year old male, resting comfortably and in no acute distress. Vital signs are significant for elevated heart rate. Oxygen saturation is 97%, which is normal. Head is normocephalic and atraumatic. PERRLA, EOMI. Oropharynx is clear. Neck is nontender without adenopathy or JVD. Back is nontender and there is no CVA tenderness. Lungs are clear without rales, wheezes, or rhonchi. Chest is nontender. Heart has regular rate and rhythm without murmur. Abdomen is soft, flat, nontender without masses or hepatosplenomegaly and peristalsis is normoactive. Extremities: Moderate swelling and tenderness around the proximal right humerus.  Significant pain with any movement of the right humerus.  No tenderness over the right clavicle or AC joint, distal humerus, elbow, forearm.  Distal neurovascular exam is intact.  No other extremity injury seen. Skin is warm and dry without rash. Neurologic: Mental status is normal, cranial nerves are intact, there are no motor or sensory deficits.  ED Treatments / Results   Radiology Dg Shoulder  Right  Result Date: 10/31/2017 CLINICAL DATA:  Status post fall, with right shoulder pain. Patient on Eliquis. Initial encounter. EXAM: RIGHT SHOULDER - 2+ VIEW COMPARISON:  Chest radiograph performed 03/27/2017 FINDINGS: There is no evidence of fracture or dislocation. Inferior osteophyte formation is noted at the medial humeral head. There is chronic cortical sclerosis at the humeral head. The right humeral head is seated within the glenoid fossa. Minimal degenerative change is noted at the right acromioclavicular joint. No significant soft tissue abnormalities are seen. The visualized portions of the right lung are clear. IMPRESSION: No evidence of fracture or dislocation. Electronically Signed   By: Garald Balding M.D.   On: 10/31/2017 06:35   Ct Head Wo Contrast  Result Date: 10/31/2017 CLINICAL DATA:  Fall with head impact EXAM: CT HEAD WITHOUT CONTRAST CT CERVICAL SPINE WITHOUT CONTRAST TECHNIQUE: Multidetector CT imaging of the head and cervical spine was performed following the standard protocol without intravenous contrast. Multiplanar CT image reconstructions of the cervical spine were also generated. COMPARISON:  Head CT and cervical spine CT 03/13/2017 FINDINGS: CT HEAD FINDINGS Brain: No mass lesion, intraparenchymal hemorrhage or extra-axial collection. No evidence of acute cortical infarct. Old right MCA distribution infarct with associated encephalomalacia and ex vacuo dilatation of the right lateral ventricle. There is periventricular hypoattenuation compatible with chronic microvascular disease. Vascular: No hyperdense vessel or unexpected calcification. Skull: Normal visualized skull base, calvarium and extracranial soft tissues. Sinuses/Orbits: Near complete opacification of the left maxillary sinus. Normal orbits. CT CERVICAL SPINE FINDINGS Alignment: No static subluxation. Facets are aligned. Occipital condyles are normally positioned. Skull base and vertebrae: No acute fracture. Soft  tissues and spinal canal: No prevertebral fluid or swelling. No visible canal hematoma. Disc levels: Multilevel disc space narrowing and facet arthrosis. Multilevel neural foraminal narrowing, greatest at the C3-C6 levels. Upper chest: No pneumothorax, pulmonary nodule or pleural effusion. Other: Normal visualized paraspinal cervical soft tissues. IMPRESSION: 1. No acute intracranial abnormality or skull fracture. 2. Old right MCA distribution infarct with associated encephalomalacia. 3. No acute fracture or static subluxation of the cervical spine. 4. Multilevel cervical degenerative disc disease and facet arthrosis with multilevel mid cervical neural foraminal narrowing. Electronically Signed   By: Ulyses Jarred M.D.   On: 10/31/2017 06:55   Ct Cervical Spine Wo Contrast  Result Date: 10/31/2017 CLINICAL DATA:  Fall with head impact EXAM: CT HEAD WITHOUT CONTRAST CT CERVICAL SPINE WITHOUT CONTRAST TECHNIQUE: Multidetector CT imaging of the head and cervical spine was performed following the standard protocol without intravenous contrast. Multiplanar CT image reconstructions of the cervical spine were also generated. COMPARISON:  Head CT and cervical spine CT 03/13/2017 FINDINGS: CT HEAD FINDINGS Brain: No mass lesion, intraparenchymal hemorrhage or extra-axial collection. No evidence of acute cortical infarct. Old right MCA distribution infarct with associated encephalomalacia and ex vacuo dilatation of the right lateral ventricle. There is periventricular hypoattenuation compatible with chronic microvascular disease. Vascular: No hyperdense vessel or unexpected calcification. Skull: Normal visualized skull base, calvarium and extracranial soft tissues. Sinuses/Orbits: Near complete opacification of the left maxillary sinus. Normal orbits. CT CERVICAL SPINE FINDINGS Alignment: No static subluxation. Facets are aligned. Occipital condyles are normally positioned. Skull base and vertebrae: No acute fracture.  Soft tissues and spinal canal: No prevertebral fluid or swelling. No visible canal hematoma. Disc levels: Multilevel disc space narrowing and facet arthrosis. Multilevel neural foraminal narrowing, greatest at the C3-C6 levels. Upper chest: No pneumothorax, pulmonary nodule or pleural effusion. Other: Normal visualized paraspinal cervical soft tissues. IMPRESSION: 1. No acute intracranial abnormality or skull fracture. 2. Old right MCA distribution infarct with associated encephalomalacia. 3. No acute fracture or static subluxation of the cervical spine. 4. Multilevel cervical degenerative disc disease and facet arthrosis with multilevel mid cervical neural foraminal narrowing. Electronically Signed   By: Ulyses Jarred M.D.   On: 10/31/2017 06:55    Procedures Procedures (including critical care time)  Medications Ordered in ED Medications  traMADol (ULTRAM) tablet 50 mg (50 mg Oral Given 10/31/17 3662)     Initial Impression / Assessment and Plan / ED Course  I have reviewed the triage vital signs and the nursing notes.  Pertinent labs & imaging results that were available during my care of the patient were reviewed by me and considered in my medical decision making (see chart for details).  Fall with blunt head trauma and injury to right shoulder and patient on anticoagulant.  He will be sent for CT of head and cervical spine, plain x-rays of his right shoulder.  Old records are reviewed, and he has one prior ED visits for falls.  CT scan shows no intracranial bleeding and no cervical spine fracture.  Shoulder x-ray shows no fracture.  He is given tramadol for pain.  He is placed in a sling for comfort and discharged with prescription for tramadol.  Follow-up with PCP and with his orthopedic surgeon.  Return precautions discussed.  Risk of delayed bleeding because of anticoagulant use was explained.  Final Clinical Impressions(s) / ED Diagnoses   Final diagnoses:  Fall from slipping, initial  encounter  Contusion of right shoulder, initial encounter  Contusion of scalp, initial encounter  Chronic anticoagulation    ED Discharge Orders        Ordered    traMADol (ULTRAM) 50 MG tablet  Every 6 hours PRN     94/76/54 6503       Delora Fuel, MD 54/65/68 [phone]

## 2017-10-31 NOTE — Telephone Encounter (Signed)
Please advise 

## 2017-10-31 NOTE — Telephone Encounter (Signed)
Patient has not had face to face visit w/ PCP in the last 90 days for home health certification. Called patient's daughter and scheduled appointment for 11/06/17 @ 2:30pm. Tuesday is the only day he can come in due to transportation. They will wait for recommendation from home health eval before ordering transport chair as well.  After appt was scheduled, she mentioned that her dad is also needing something stronger than Tramadol for pain control because his pain is so severe that it severely limits his movement and activity. Discussed w/ Dr. Raliegh Ip, okay to leave appointment as it is in 15 min slot.

## 2017-10-31 NOTE — ED Notes (Signed)
MD notified of pt's request for pain meds.

## 2017-10-31 NOTE — ED Notes (Signed)
Pt alert and oriented x4. Skin warm and dry. Respirations equal and unlabored. Pt complaint of neck pain and shoulder pain after fall. Pt states takes blood thinners. Denies vision issues. Pupils equal and reactive to light. Pt able to talk in full complete sentences with clear speech.

## 2017-10-31 NOTE — Telephone Encounter (Signed)
Okay for transport chair with removable arms and home OT and PT

## 2017-10-31 NOTE — Discharge Instructions (Signed)
Your scans and x-rays did not show any acute injury.  He suffered bruises in the fall.  Please apply ice to minimize swelling and to minimize pain.  Because you are taking a blood thinner, you are at risk for delayed bleeding.  If you are having any change in your condition over the next 1-2 days, then return to the emergency department.

## 2017-10-31 NOTE — Telephone Encounter (Signed)
Daughter called - she would like for pt to be eval for PT and OT. She would in home PT and OT.  Pt is extremely de-conditioned.  He fell today, and was sent to ED, treated and sent home.   cb  cb is [phone] - roxanne  Home is [phone]

## 2017-11-02 ENCOUNTER — Telehealth: Payer: Self-pay | Admitting: Internal Medicine

## 2017-11-02 NOTE — Telephone Encounter (Signed)
Copied from [place] [id number]. Topic: Quick Communication - See Telephone Encounter >> Nov 02, 2017  1:43 PM Aurelio Brash B wrote: CRM for notification. See Telephone encounter for:   Pts daughter Roxann called to request the pt be prescribed tylenol 3  aprox 12 tablets  to get him through until his apt with Dr Raliegh Ip  on Tuesday.  She states the tramadol does not help him.   [facility] [place], [place] [address] [place] [address] [facility] [place] [phone] (Phone) [phone] (Fax)    11/02/17.

## 2017-11-06 ENCOUNTER — Ambulatory Visit (INDEPENDENT_AMBULATORY_CARE_PROVIDER_SITE_OTHER): Payer: Medicare Other | Admitting: Internal Medicine

## 2017-11-06 ENCOUNTER — Encounter: Payer: Self-pay | Admitting: Internal Medicine

## 2017-11-06 VITALS — Temp 98.2°F

## 2017-11-06 DIAGNOSIS — I6300 Cerebral infarction due to thrombosis of unspecified precerebral artery: Secondary | ICD-10-CM

## 2017-11-06 DIAGNOSIS — I2510 Atherosclerotic heart disease of native coronary artery without angina pectoris: Secondary | ICD-10-CM

## 2017-11-06 DIAGNOSIS — I10 Essential (primary) hypertension: Secondary | ICD-10-CM

## 2017-11-06 MED ORDER — HYDROCODONE-ACETAMINOPHEN 5-325 MG PO TABS: 1.0000 | ORAL_TABLET | Freq: Four times a day (QID) | ORAL | 0 refills | Status: DC | PRN

## 2017-11-06 MED ORDER — HYDROCODONE-ACETAMINOPHEN 5-325 MG PO TABS: ORAL_TABLET | ORAL | 0 refills | Status: DC

## 2017-11-06 MED ORDER — BENAZEPRIL HCL 5 MG PO TABS: 5.0000 mg | ORAL_TABLET | Freq: Every day | ORAL | 3 refills | Status: DC

## 2017-11-06 NOTE — Progress Notes (Signed)
Subjective:    Patient ID: Brian [name], male    DOB: 04/26/1933, 84 y.o.   MRN: [id number]  HPI  84 year old patient who is seen today in follow-up.  He was seen in the ED 6 days ago after a fall resulting in head neck and right shoulder trauma.  He was treated with tramadol which he states is not effective.  He states that he is unable to participate with physical therapy due to the pain.  Is also having considerable left knee pain.  Is accompanied by his son and other caregivers today. He has a history of paroxysmal atrial fibrillation and remains on anticoagulation.  He has essential hypertension.  Past Medical History:  Diagnosis Date  . Anemia   . Atrial flutter ([place])   . BPH (benign prostatic hypertrophy)   . Epistaxis   . Hemorrhage of gastrointestinal tract, unspecified   . History of open heart surgery   . Hypertension   . Osteoarthrosis, unspecified whether generalized or localized, unspecified site   . Personal history of venous thrombosis and embolism   . Rosacea   . Stroke ([place])   . TIA (transient ischemic attack)      Social History   Socioeconomic History  . Marital status: Married    Spouse name: Not on file  . Number of children: Not on file  . Years of education: Not on file  . Highest education level: Not on file  Social Needs  . Financial resource strain: Not on file  . Food insecurity - worry: Not on file  . Food insecurity - inability: Not on file  . Transportation needs - medical: Not on file  . Transportation needs - non-medical: Not on file  Occupational History  . Not on file  Tobacco Use  . Smoking status: Former Research scientist (life sciences)  . Smokeless tobacco: Never Used  . Tobacco comment: quit 50 yr ago  Substance and Sexual Activity  . Alcohol use: No  . Drug use: No  . Sexual activity: Not on file  Other Topics Concern  . Not on file  Social History Narrative  . Not on file    Past Surgical History:  Procedure Laterality Date  . APPENDECTOMY     . HERNIA REPAIR    . IR GENERIC HISTORICAL  05/04/2016   IR PERCUTANEOUS ART THROMBECTOMY/INFUSION INTRACRANIAL INC DIAG ANGIO 05/04/2016 [name], MD [facility]  . IR GENERIC HISTORICAL  05/04/2016   IR ANGIO VERTEBRAL SEL SUBCLAVIAN INNOMINATE UNI R MOD SED 05/04/2016 [name], MD [facility]  . IR GENERIC HISTORICAL  06/07/2016   IR RADIOLOGIST EVAL & MGMT 06/07/2016 [facility]  . JOINT REPLACEMENT    . MITRAL VALVE REPAIR    . mvp repair    . RADIOLOGY WITH ANESTHESIA N/A 05/04/2016   Procedure: RADIOLOGY WITH ANESTHESIA;  Surgeon: [name], MD;  Location: [facility];  Service: Radiology;  Laterality: N/A;  . TOTAL HIP ARTHROPLASTY      Family History  Problem Relation Age of Onset  . Rheumatic fever Mother   . Coronary artery disease Father     Allergies  Allergen Reactions  . Novocain [Procaine Hcl]     Irregular heart beat     . Penicillins Itching    Has patient had a PCN reaction causing immediate rash, facial/tongue/throat swelling, SOB or lightheadedness with hypotension: Unk Has patient had a PCN reaction causing severe rash involving mucus membranes or skin necrosis: Unk Has patient had a PCN reaction  that required hospitalization: Unk Has patient had a PCN reaction occurring within the last 10 years: No If all of the above answers are "NO", then may proceed with Cephalosporin use.     Current Outpatient Medications on File Prior to Visit  Medication Sig Dispense Refill  . acetaminophen (TYLENOL) 325 MG tablet Take 650 mg by mouth 3 (three) times daily.    Marland Kitchen ALPRAZolam (XANAX) 0.25 MG tablet TAKE 1 TABLET BY MOUTH AT BEDTIME 90 tablet 0  . benazepril-hydrochlorthiazide (LOTENSIN HCT) 5-6.25 MG tablet TAKE 1 TABLET BY MOUTH DAILY 90 tablet 0  . ELIQUIS 2.5 MG TABS tablet TAKE 1 TABLET BY MOUTH TWICE DAILY 60 tablet 0  . finasteride (PROSCAR) 5 MG tablet Take 1 tablet (5 mg total) daily by mouth. 90 tablet 3  . ipratropium (ATROVENT)  0.06 % nasal spray USE 2 SPRAYS IN EACH NOSTRIL FOUR TIMES DAILY 15 mL 0  . loratadine (CLARITIN) 10 MG tablet Take 10 mg by mouth daily.  1  . metoprolol succinate (TOPROL-XL) 25 MG 24 hr tablet Take 1 tablet (25 mg total) by mouth daily. (Patient taking differently: Take 25 mg by mouth every evening. ) 270 tablet 0  . metoprolol succinate (TOPROL-XL) 25 MG 24 hr tablet TAKE 1 TABLET BY MOUTH EVERY MORNING AND TAKE 2 TABLETS BY MOUTH EVERY EVENING 270 tablet 0  . pantoprazole (PROTONIX) 40 MG tablet Take 1 tablet (40 mg total) daily by mouth. 90 tablet 3  . pravastatin (PRAVACHOL) 20 MG tablet Take 1 tablet (20 mg total) daily by mouth. 90 tablet 3  . sertraline (ZOLOFT) 25 MG tablet Take 1 tablet (25 mg total) daily by mouth. 30 tablet 5  . traMADol (ULTRAM) 50 MG tablet Take 1 tablet (50 mg total) by mouth every 6 (six) hours as needed for moderate pain or severe pain. 15 tablet 0   No current facility-administered medications on file prior to visit.     Temp 98.2 F (36.8 C) (Oral)   SpO2 96%     Review of Systems  Constitutional: Negative for appetite change, chills, fatigue and fever.  HENT: Negative for congestion, dental problem, ear pain, hearing loss, sore throat, tinnitus, trouble swallowing and voice change.   Eyes: Negative for pain, discharge and visual disturbance.  Respiratory: Negative for cough, chest tightness, wheezing and stridor.   Cardiovascular: Negative for chest pain, palpitations and leg swelling.  Gastrointestinal: Negative for abdominal distention, abdominal pain, blood in stool, constipation, diarrhea, nausea and vomiting.  Genitourinary: Negative for difficulty urinating, discharge, flank pain, genital sores, hematuria and urgency.  Musculoskeletal: Positive for arthralgias and gait problem. Negative for back pain, joint swelling, myalgias and neck stiffness.       Main complaints are right shoulder and left knee pain also complaining of cervical pain  following a recent fall  Skin: Negative for rash.  Neurological: Negative for dizziness, syncope, speech difficulty, weakness, numbness and headaches.  Hematological: Negative for adenopathy. Does not bruise/bleed easily.  Psychiatric/Behavioral: Negative for behavioral problems and dysphoric mood. The patient is not nervous/anxious.        Objective:   Physical Exam  Constitutional: He is oriented to person, place, and time. He appears well-developed.  Wheelchair-bound Blood pressure 110/70  HENT:  Head: Normocephalic.  Right Ear: External ear normal.  Left Ear: External ear normal.  Eyes: Conjunctivae and EOM are normal.  Neck: Normal range of motion.  Cardiovascular: Normal rate and normal heart sounds.  Pulmonary/Chest: Breath sounds normal.  Abdominal:  Bowel sounds are normal.  Musculoskeletal: Normal range of motion. He exhibits edema. He exhibits no tenderness.  Swelling and increased warmth involving the left knee  Neurological: He is alert and oriented to person, place, and time.  Skin:  Extensive ecchymoses involving the right shoulder and right upper arm Full range of motion of the right shoulder  Psychiatric: He has a normal mood and affect. His behavior is normal.          Assessment & Plan:  Status post recent fall with contusion of the right shoulder Status post CVA with left hemiparesis High fall risk.  Will resume home physical therapy.  Patient is agreeable Osteoarthritis with painful left knee.  Patient's pain has not been well controlled with tramadol we will try short-term hydrocodone.  Potential complications such as constipation sedation increased fall risk all discussed.  Patient does have assistance with transfers.  His son will be administering all medications Hypertension.  Will discontinue hydrochlorothiazide and maintain benazepril only  Order for home physical therapy placed Return in 1 month for follow-up  Nyoka Cowden

## 2017-11-06 NOTE — Telephone Encounter (Signed)
Pt has a OV today.

## 2017-11-06 NOTE — Patient Instructions (Signed)
Discontinue Tylenol (acetaminophen) and tramadol  Home physical therapy as discussed

## 2017-11-07 ENCOUNTER — Telehealth: Payer: Self-pay | Admitting: Family Medicine

## 2017-11-07 NOTE — Telephone Encounter (Signed)
Copied from [place] [phone]. Topic: General - Other >> Nov 07, 2017  1:17 PM Yvette Rack wrote: Reason for CRM: patient daughter [name] [phone] calling stating that her father is still complaining of shoulder pain and don't think that the    HYDROcodone-acetaminophen (NORCO/VICODIN) 5-325 MG tablet   is working and would like to get a pain patch to keep his pain under control  Daughter still want to know why her father is on blood pressure medicine please give her a call and leave a message if she don't pick up

## 2017-11-07 NOTE — Telephone Encounter (Signed)
Notify patient/family that I do not prescribe pain patches or any narcotics stronger than hydrocodone; We can refer to chronic pain clinic if patient desires or follow-up with orthopedics

## 2017-11-07 NOTE — Telephone Encounter (Signed)
Noted routing to [facility]

## 2017-11-08 ENCOUNTER — Telehealth: Payer: Self-pay

## 2017-11-08 DIAGNOSIS — I10 Essential (primary) hypertension: Secondary | ICD-10-CM | POA: Diagnosis not present

## 2017-11-08 DIAGNOSIS — I482 Chronic atrial fibrillation: Secondary | ICD-10-CM | POA: Diagnosis not present

## 2017-11-08 DIAGNOSIS — I6300 Cerebral infarction due to thrombosis of unspecified precerebral artery: Secondary | ICD-10-CM

## 2017-11-08 DIAGNOSIS — I69354 Hemiplegia and hemiparesis following cerebral infarction affecting left non-dominant side: Secondary | ICD-10-CM | POA: Diagnosis not present

## 2017-11-08 DIAGNOSIS — I2510 Atherosclerotic heart disease of native coronary artery without angina pectoris: Secondary | ICD-10-CM | POA: Diagnosis not present

## 2017-11-08 DIAGNOSIS — S8992XD Unspecified injury of left lower leg, subsequent encounter: Secondary | ICD-10-CM | POA: Diagnosis not present

## 2017-11-08 DIAGNOSIS — S4991XD Unspecified injury of right shoulder and upper arm, subsequent encounter: Secondary | ICD-10-CM | POA: Diagnosis not present

## 2017-11-08 NOTE — Telephone Encounter (Signed)
Called patient and left message to return call

## 2017-11-08 NOTE — Telephone Encounter (Signed)
Copied from [place] [id number]. Topic: General - Other >> Nov 08, 2017  4:03 PM Oneta Rack wrote: Osvaldo Human name: Laurey Arrow  Relation to pt: PT from [facility]  Call back number: [phone]    Reason for call:   PT eval at home today and will continue 2x 2 and would like orders for OT eval and orders for a hospital bed with overhead trapeze bar, please advise  >> Nov 08, 2017  4:04 PM Oneta Rack wrote: Osvaldo Human name: Laurey Arrow  Relation to pt: PT from [facility]  Call back number: [phone]    Reason for call:   PT eval at home today and will continue 2x 2 and would like orders for OT eval and orders for a hospital bed with overhead trapeze bar, please advise

## 2017-11-12 NOTE — Telephone Encounter (Signed)
Okay for verbal orders. 

## 2017-11-12 NOTE — Telephone Encounter (Signed)
Patient daughter called back today and said that she would like to discuss about her dad going to pain clinic. Also talk about a medication that was sent for him and doesn't know why it was sent in. The medication is Benazepril. She said that her dad does not have hypertension. Please return call back, thanks.

## 2017-11-13 ENCOUNTER — Telehealth: Payer: Self-pay | Admitting: Internal Medicine

## 2017-11-13 DIAGNOSIS — I482 Chronic atrial fibrillation: Secondary | ICD-10-CM | POA: Diagnosis not present

## 2017-11-13 DIAGNOSIS — S4991XD Unspecified injury of right shoulder and upper arm, subsequent encounter: Secondary | ICD-10-CM | POA: Diagnosis not present

## 2017-11-13 DIAGNOSIS — F419 Anxiety disorder, unspecified: Secondary | ICD-10-CM

## 2017-11-13 DIAGNOSIS — I69354 Hemiplegia and hemiparesis following cerebral infarction affecting left non-dominant side: Secondary | ICD-10-CM | POA: Diagnosis not present

## 2017-11-13 DIAGNOSIS — I2510 Atherosclerotic heart disease of native coronary artery without angina pectoris: Secondary | ICD-10-CM | POA: Diagnosis not present

## 2017-11-13 DIAGNOSIS — I10 Essential (primary) hypertension: Secondary | ICD-10-CM | POA: Diagnosis not present

## 2017-11-13 DIAGNOSIS — S8992XD Unspecified injury of left lower leg, subsequent encounter: Secondary | ICD-10-CM | POA: Diagnosis not present

## 2017-11-13 NOTE — Addendum Note (Signed)
Addended by: Gwenyth Ober R on: 11/13/2017 01:31 PM   Modules accepted: Orders

## 2017-11-13 NOTE — Telephone Encounter (Signed)
Called Brian [name] (who is on the DPR) and she stated that her father is on Zoloft. Daughter states her father fell a week ago and was seen  And ok'd by the ED. She states that his anxiety and pain are "out of control." States that Tylenol #3 is not touching the pain. Daughter is asking if pt could be put on Seroquel to help. She stated that she does not want her father to be put on a strong pain med.

## 2017-11-13 NOTE — Telephone Encounter (Signed)
Discussed patient with daughter, [name], yesterday.  All questions answered.  It is felt that anxiety is a major factor and she will encourage use of Lexapro which the patient has not taken.  Will consider referral to chronic pain management in the future if necessary

## 2017-11-13 NOTE — Telephone Encounter (Signed)
Noted! No futher action needed.

## 2017-11-13 NOTE — Telephone Encounter (Signed)
Copied from [place] [phone]. Topic: Quick Communication - See Telephone Encounter >> Nov 13, 2017  1:48 PM Hewitt Shorts wrote: CRM for notification. See Telephone encounter for: 11/13/17. Pt daughter roxanne called to let the provider know that pt is taking his zoloft and is hoping to talk with the provider to put pt on seroquel 25 mg   Best number for roxanne is [phone]

## 2017-11-13 NOTE — Telephone Encounter (Signed)
[facility]

## 2017-11-14 ENCOUNTER — Telehealth: Payer: Self-pay

## 2017-11-14 ENCOUNTER — Telehealth: Payer: Self-pay | Admitting: Internal Medicine

## 2017-11-14 DIAGNOSIS — I2510 Atherosclerotic heart disease of native coronary artery without angina pectoris: Secondary | ICD-10-CM | POA: Diagnosis not present

## 2017-11-14 DIAGNOSIS — I10 Essential (primary) hypertension: Secondary | ICD-10-CM | POA: Diagnosis not present

## 2017-11-14 DIAGNOSIS — I69354 Hemiplegia and hemiparesis following cerebral infarction affecting left non-dominant side: Secondary | ICD-10-CM | POA: Diagnosis not present

## 2017-11-14 DIAGNOSIS — I482 Chronic atrial fibrillation: Secondary | ICD-10-CM | POA: Diagnosis not present

## 2017-11-14 DIAGNOSIS — S8992XD Unspecified injury of left lower leg, subsequent encounter: Secondary | ICD-10-CM | POA: Diagnosis not present

## 2017-11-14 DIAGNOSIS — S4991XD Unspecified injury of right shoulder and upper arm, subsequent encounter: Secondary | ICD-10-CM | POA: Diagnosis not present

## 2017-11-14 NOTE — Telephone Encounter (Signed)
Copied from [place]. Topic: Quick Communication - See Telephone Encounter >> Nov 14, 2017  4:49 PM Arletha Grippe wrote: CRM for notification. See Telephone encounter for: 11/14/17. Will from [facility] called - verbal orders for OT  2 week 4 Cb [phone]

## 2017-11-14 NOTE — Telephone Encounter (Signed)
Called patient and left message to return call

## 2017-11-15 ENCOUNTER — Telehealth: Payer: Self-pay | Admitting: Internal Medicine

## 2017-11-15 ENCOUNTER — Telehealth: Payer: Self-pay | Admitting: Family Medicine

## 2017-11-15 MED ORDER — GIZMO CONDOM CATHETER MISC: 0 refills | Status: DC

## 2017-11-15 NOTE — Telephone Encounter (Signed)
Copied from [place]. Topic: General - Other >> Nov 15, 2017  1:01 PM Carolyn Stare wrote:  Pt wife called to ask for a RX for CONDOM CATHETERS.   [phone]   Duplicate phone note, see previous one.

## 2017-11-15 NOTE — Telephone Encounter (Signed)
Copied from [place]. Topic: General - Other >> Nov 15, 2017  1:17 PM Brian [name], NT wrote: Reason for CRM The patients caregiver  called  give to  a fax number for  a prescription for medium condom catheters  to be faxed to  [facility] at [phone] or call [phone]

## 2017-11-15 NOTE — Telephone Encounter (Signed)
Spoke to [place] and she stated that the issue could wait until Dr.Kwiatkowski gets into the office 11/19/2017. Patient is wanting her dad to be on Seroquel instead of Zoloft due to a recent fall.

## 2017-11-15 NOTE — Telephone Encounter (Signed)
Spoke to [place], patient's wife and she was informed that the prescription was faxed to [facility] place.

## 2017-11-16 ENCOUNTER — Other Ambulatory Visit: Payer: Self-pay | Admitting: Internal Medicine

## 2017-11-16 ENCOUNTER — Telehealth: Payer: Self-pay | Admitting: Internal Medicine

## 2017-11-16 DIAGNOSIS — F419 Anxiety disorder, unspecified: Secondary | ICD-10-CM

## 2017-11-16 NOTE — Telephone Encounter (Signed)
I spoke with pt and he does have OTC cream to use for the weekend, also has pain medication to get by until Dr. Raliegh Ip get's back in the office, will forward note for review.

## 2017-11-16 NOTE — Telephone Encounter (Signed)
Copied from [place]. Topic: Quick Communication - See Telephone Encounter >> Nov 16, 2017  1:04 PM Conception Chancy, NT wrote: CRM for notification. See Telephone encounter for: 11/16/17.  Patient is calling and states that he is needing a prescription for Hemorid called in, also requesting stronger pain medication. He also states that he wants to continue with physical therapy that is really helps him. Please advise and contact pt.  [facility] - [place], [place] [facility] OF [place]  [place] [address]  Phone: [phone] Fax: [phone]

## 2017-11-18 NOTE — Telephone Encounter (Signed)
Okay verbal orders for OT

## 2017-11-18 NOTE — Telephone Encounter (Signed)
Please schedule psychiatric consultation and let patient and family aware

## 2017-11-19 ENCOUNTER — Telehealth: Payer: Self-pay | Admitting: Family Medicine

## 2017-11-19 DIAGNOSIS — I482 Chronic atrial fibrillation: Secondary | ICD-10-CM | POA: Diagnosis not present

## 2017-11-19 DIAGNOSIS — I69354 Hemiplegia and hemiparesis following cerebral infarction affecting left non-dominant side: Secondary | ICD-10-CM | POA: Diagnosis not present

## 2017-11-19 DIAGNOSIS — I2510 Atherosclerotic heart disease of native coronary artery without angina pectoris: Secondary | ICD-10-CM | POA: Diagnosis not present

## 2017-11-19 DIAGNOSIS — S8992XD Unspecified injury of left lower leg, subsequent encounter: Secondary | ICD-10-CM | POA: Diagnosis not present

## 2017-11-19 DIAGNOSIS — S4991XD Unspecified injury of right shoulder and upper arm, subsequent encounter: Secondary | ICD-10-CM | POA: Diagnosis not present

## 2017-11-19 DIAGNOSIS — I10 Essential (primary) hypertension: Secondary | ICD-10-CM | POA: Diagnosis not present

## 2017-11-19 NOTE — Telephone Encounter (Signed)
Spoke to [place] and informed her if the update regarding the patient. Consult sent in.

## 2017-11-19 NOTE — Telephone Encounter (Signed)
Pt daughter is aware the nurse will call her back

## 2017-11-19 NOTE — Telephone Encounter (Signed)
Spoke to Brian [name] and informed her of Dr.Kwiatkowski decision. No further info needed.

## 2017-11-19 NOTE — Telephone Encounter (Signed)
Copied from [place] [phone]. Topic: Referral - Request >> Nov 19, 2017 11:17 AM Lennox Solders wrote: Reason for CRM: pt daughter roxanne is calling requesting a referral to Pain management for shoulder and knee pain

## 2017-11-19 NOTE — Telephone Encounter (Signed)
Will Schalla from [facility] giving verbal orders per Dr.Kwiatkowski.

## 2017-11-19 NOTE — Telephone Encounter (Signed)
Called patient daughter and left message to return call.

## 2017-11-19 NOTE — Addendum Note (Signed)
Addended by: Gwenyth Ober R on: 11/19/2017 11:36 AM   Modules accepted: Orders

## 2017-11-20 NOTE — Telephone Encounter (Signed)
Okay to continue home physical therapy

## 2017-11-21 ENCOUNTER — Telehealth: Payer: Self-pay | Admitting: Internal Medicine

## 2017-11-21 DIAGNOSIS — I10 Essential (primary) hypertension: Secondary | ICD-10-CM | POA: Diagnosis not present

## 2017-11-21 DIAGNOSIS — I2510 Atherosclerotic heart disease of native coronary artery without angina pectoris: Secondary | ICD-10-CM | POA: Diagnosis not present

## 2017-11-21 DIAGNOSIS — I482 Chronic atrial fibrillation: Secondary | ICD-10-CM | POA: Diagnosis not present

## 2017-11-21 DIAGNOSIS — I69354 Hemiplegia and hemiparesis following cerebral infarction affecting left non-dominant side: Secondary | ICD-10-CM | POA: Diagnosis not present

## 2017-11-21 DIAGNOSIS — S8992XD Unspecified injury of left lower leg, subsequent encounter: Secondary | ICD-10-CM | POA: Diagnosis not present

## 2017-11-21 DIAGNOSIS — S4991XD Unspecified injury of right shoulder and upper arm, subsequent encounter: Secondary | ICD-10-CM | POA: Diagnosis not present

## 2017-11-21 NOTE — Telephone Encounter (Signed)
Generic Anusol HC cream

## 2017-11-21 NOTE — Telephone Encounter (Signed)
Okay for refill or new prescription

## 2017-11-21 NOTE — Telephone Encounter (Signed)
What medication can I send in?  please advise

## 2017-11-21 NOTE — Telephone Encounter (Signed)
Copied from [place] [phone]. Topic: Quick Communication - See Telephone Encounter >> Nov 21, 2017 10:53 AM Conception Chancy, NT wrote: CRM for notification. See Telephone encounter for: 11/21/17.  Patient is needing a refill on Nupercainal cream for hemorrhoids. I do not see it on his file. Please advise.   [facility] - [place], [place] [facility] OF [place] [address] Phone: [phone] Fax: [phone]

## 2017-11-21 NOTE — Telephone Encounter (Signed)
Yes okay for topical medication

## 2017-11-21 NOTE — Telephone Encounter (Signed)
Copied from [place] [phone]. Topic: Quick Communication - Rx Refill/Question >> Nov 21, 2017  4:25 PM Oliver Pila B wrote: Medication: Catheters ([place] CONDOM CATHETER) MISC [[id number]]  Has the patient contacted their pharmacy? Yes.   (Agent: If no, request that the patient contact the pharmacy for the refill.) Preferred Pharmacy (with phone number or street name): [facility]  [phone]) Agent: Please be advised that RX refills may take up to 3 business days. We ask that you follow-up with your pharmacy.

## 2017-11-22 ENCOUNTER — Other Ambulatory Visit: Payer: Self-pay

## 2017-11-22 DIAGNOSIS — I2510 Atherosclerotic heart disease of native coronary artery without angina pectoris: Secondary | ICD-10-CM | POA: Diagnosis not present

## 2017-11-22 DIAGNOSIS — I10 Essential (primary) hypertension: Secondary | ICD-10-CM | POA: Diagnosis not present

## 2017-11-22 DIAGNOSIS — S8992XD Unspecified injury of left lower leg, subsequent encounter: Secondary | ICD-10-CM | POA: Diagnosis not present

## 2017-11-22 DIAGNOSIS — I69354 Hemiplegia and hemiparesis following cerebral infarction affecting left non-dominant side: Secondary | ICD-10-CM | POA: Diagnosis not present

## 2017-11-22 DIAGNOSIS — I482 Chronic atrial fibrillation: Secondary | ICD-10-CM | POA: Diagnosis not present

## 2017-11-22 DIAGNOSIS — S4991XD Unspecified injury of right shoulder and upper arm, subsequent encounter: Secondary | ICD-10-CM | POA: Diagnosis not present

## 2017-11-22 MED ORDER — HYDROCORTISONE 2.5 % RE CREA: 1.0000 "application " | TOPICAL_CREAM | Freq: Two times a day (BID) | RECTAL | 0 refills | Status: DC

## 2017-11-22 NOTE — Telephone Encounter (Signed)
Called pharmacy regarding condom caths and they said to call them back so they can find the script. Will return phone call later.

## 2017-11-22 NOTE — Telephone Encounter (Signed)
Medication sent in. Patient aware. °

## 2017-11-24 DIAGNOSIS — I69354 Hemiplegia and hemiparesis following cerebral infarction affecting left non-dominant side: Secondary | ICD-10-CM | POA: Diagnosis not present

## 2017-11-24 DIAGNOSIS — S8992XD Unspecified injury of left lower leg, subsequent encounter: Secondary | ICD-10-CM | POA: Diagnosis not present

## 2017-11-24 DIAGNOSIS — I2510 Atherosclerotic heart disease of native coronary artery without angina pectoris: Secondary | ICD-10-CM | POA: Diagnosis not present

## 2017-11-24 DIAGNOSIS — I482 Chronic atrial fibrillation: Secondary | ICD-10-CM | POA: Diagnosis not present

## 2017-11-24 DIAGNOSIS — S4991XD Unspecified injury of right shoulder and upper arm, subsequent encounter: Secondary | ICD-10-CM | POA: Diagnosis not present

## 2017-11-24 DIAGNOSIS — I10 Essential (primary) hypertension: Secondary | ICD-10-CM | POA: Diagnosis not present

## 2017-11-26 ENCOUNTER — Telehealth: Payer: Self-pay | Admitting: Gastroenterology

## 2017-11-26 ENCOUNTER — Telehealth: Payer: Self-pay | Admitting: Internal Medicine

## 2017-11-26 DIAGNOSIS — I482 Chronic atrial fibrillation: Secondary | ICD-10-CM | POA: Diagnosis not present

## 2017-11-26 DIAGNOSIS — I2510 Atherosclerotic heart disease of native coronary artery without angina pectoris: Secondary | ICD-10-CM | POA: Diagnosis not present

## 2017-11-26 DIAGNOSIS — I69354 Hemiplegia and hemiparesis following cerebral infarction affecting left non-dominant side: Secondary | ICD-10-CM | POA: Diagnosis not present

## 2017-11-26 DIAGNOSIS — S4991XD Unspecified injury of right shoulder and upper arm, subsequent encounter: Secondary | ICD-10-CM | POA: Diagnosis not present

## 2017-11-26 DIAGNOSIS — I10 Essential (primary) hypertension: Secondary | ICD-10-CM | POA: Diagnosis not present

## 2017-11-26 DIAGNOSIS — S8992XD Unspecified injury of left lower leg, subsequent encounter: Secondary | ICD-10-CM | POA: Diagnosis not present

## 2017-11-26 NOTE — Telephone Encounter (Signed)
Left message for patient to call back  

## 2017-11-26 NOTE — Telephone Encounter (Signed)
Copied from [place]. Topic: Quick Communication - Rx Refill/Question >> Nov 26, 2017  9:22 AM Margot Ables wrote: Medication: hydrocodone - pt is out of medication - pt son states he only takes 2/day - I did question that it was sent 11/06/17 - pt son did have RX bottle stating filled on 11/06/17 Has the patient contacted their pharmacy? yes Preferred Pharmacy (with phone number or street name): [facility] - [place], [place] [facility] OF [place] [phone] (Phone) [phone] (Fax)

## 2017-11-26 NOTE — Telephone Encounter (Signed)
Patient states he is on a BT but is having problems with his hemorrhoids. Patient wants to know if being on a BT would have any affect on banding his hemorrhoids. Not sure when pt last saw Dr.Stark but states his son goes to him as well.

## 2017-11-26 NOTE — Telephone Encounter (Signed)
Spoke to [place] and she stated that Brian [name] is out of the house until 7pm. I will call patient tomorrow.

## 2017-11-27 ENCOUNTER — Other Ambulatory Visit: Payer: Self-pay | Admitting: Internal Medicine

## 2017-11-27 DIAGNOSIS — S8992XD Unspecified injury of left lower leg, subsequent encounter: Secondary | ICD-10-CM | POA: Diagnosis not present

## 2017-11-27 DIAGNOSIS — I482 Chronic atrial fibrillation: Secondary | ICD-10-CM | POA: Diagnosis not present

## 2017-11-27 DIAGNOSIS — I10 Essential (primary) hypertension: Secondary | ICD-10-CM | POA: Diagnosis not present

## 2017-11-27 DIAGNOSIS — I2510 Atherosclerotic heart disease of native coronary artery without angina pectoris: Secondary | ICD-10-CM | POA: Diagnosis not present

## 2017-11-27 DIAGNOSIS — S4991XD Unspecified injury of right shoulder and upper arm, subsequent encounter: Secondary | ICD-10-CM | POA: Diagnosis not present

## 2017-11-27 DIAGNOSIS — I69354 Hemiplegia and hemiparesis following cerebral infarction affecting left non-dominant side: Secondary | ICD-10-CM | POA: Diagnosis not present

## 2017-11-27 NOTE — Telephone Encounter (Signed)
LOV: 11/06/17  Dr. Myra Gianotti in [place]

## 2017-11-27 NOTE — Telephone Encounter (Signed)
Copied from [place] [phone]. Topic: Quick Communication - Rx Refill/Question >> Nov 27, 2017 10:59 AM Antonieta Iba C wrote: Medication: HYDROcodone-acetaminophen (NORCO/VICODIN) 5-325 MG tablet   Has the patient contacted their pharmacy? No   (Agent: If no, request that the patient contact the pharmacy for the refill.)  Preferred Pharmacy (with phone number or street name): [facility] - [place], [place] [phone] (Phone) [phone] (Fax)     Agent: Please be advised that RX refills may take up to 3 business days. We ask that you follow-up with your pharmacy.

## 2017-11-27 NOTE — Telephone Encounter (Signed)
Patient with hemorrhoids and recent CVA.  He is on Eliquis and wants to discuss hemorrhoid banding options.  Patient will come in and see Dr. Fuller Plan on 12/26/17 11:15

## 2017-11-28 ENCOUNTER — Telehealth: Payer: Self-pay | Admitting: Internal Medicine

## 2017-11-28 NOTE — Telephone Encounter (Signed)
Call to patient- patient states he is in pain- he wants tylenol 3 like he had after his heart surgery when he recovered so quickly. Explained that he does have pain medication like that- that he is to take twice daily. He states he is in so much pain- he can't move. I asked if he has seen pain management yet and he does not know. Will check with office about that- he asked if someone will let him know.

## 2017-11-28 NOTE — Telephone Encounter (Signed)
Copied from [place]. Topic: Quick Communication - Rx Refill/Question >> Nov 28, 2017 11:36 AM Oliver Pila B wrote: Medication:  Pt called requesting a strong medication for his arthritis in his shoulder and knees since he has had a stroke; hard to understand pt on his request; better suited for a nurse to contact pt on what is needed

## 2017-11-28 NOTE — Telephone Encounter (Signed)
Tried to call again patient should have enough medication to last.

## 2017-11-28 NOTE — Telephone Encounter (Signed)
Called patient to see if condom cath was received.

## 2017-11-29 DIAGNOSIS — I69354 Hemiplegia and hemiparesis following cerebral infarction affecting left non-dominant side: Secondary | ICD-10-CM | POA: Diagnosis not present

## 2017-11-29 DIAGNOSIS — I10 Essential (primary) hypertension: Secondary | ICD-10-CM | POA: Diagnosis not present

## 2017-11-29 DIAGNOSIS — I482 Chronic atrial fibrillation: Secondary | ICD-10-CM | POA: Diagnosis not present

## 2017-11-29 DIAGNOSIS — S4991XD Unspecified injury of right shoulder and upper arm, subsequent encounter: Secondary | ICD-10-CM | POA: Diagnosis not present

## 2017-11-29 DIAGNOSIS — S8992XD Unspecified injury of left lower leg, subsequent encounter: Secondary | ICD-10-CM | POA: Diagnosis not present

## 2017-11-29 DIAGNOSIS — I2510 Atherosclerotic heart disease of native coronary artery without angina pectoris: Secondary | ICD-10-CM | POA: Diagnosis not present

## 2017-11-30 ENCOUNTER — Other Ambulatory Visit: Payer: Self-pay | Admitting: Internal Medicine

## 2017-11-30 NOTE — Telephone Encounter (Signed)
Refill  Request  Alprazolam   LOV 11/06/2017  Pharmacy on file

## 2017-11-30 NOTE — Telephone Encounter (Signed)
Checking status, please advise.

## 2017-12-03 ENCOUNTER — Other Ambulatory Visit: Payer: Self-pay | Admitting: Internal Medicine

## 2017-12-03 DIAGNOSIS — I482 Chronic atrial fibrillation: Secondary | ICD-10-CM | POA: Diagnosis not present

## 2017-12-03 DIAGNOSIS — I10 Essential (primary) hypertension: Secondary | ICD-10-CM | POA: Diagnosis not present

## 2017-12-03 DIAGNOSIS — S4991XD Unspecified injury of right shoulder and upper arm, subsequent encounter: Secondary | ICD-10-CM | POA: Diagnosis not present

## 2017-12-03 DIAGNOSIS — S8992XD Unspecified injury of left lower leg, subsequent encounter: Secondary | ICD-10-CM | POA: Diagnosis not present

## 2017-12-03 DIAGNOSIS — I69354 Hemiplegia and hemiparesis following cerebral infarction affecting left non-dominant side: Secondary | ICD-10-CM | POA: Diagnosis not present

## 2017-12-03 DIAGNOSIS — I2510 Atherosclerotic heart disease of native coronary artery without angina pectoris: Secondary | ICD-10-CM | POA: Diagnosis not present

## 2017-12-03 NOTE — Telephone Encounter (Signed)
Pt son is also requesting hydrocodone pill med

## 2017-12-05 DIAGNOSIS — I10 Essential (primary) hypertension: Secondary | ICD-10-CM | POA: Diagnosis not present

## 2017-12-05 DIAGNOSIS — I2510 Atherosclerotic heart disease of native coronary artery without angina pectoris: Secondary | ICD-10-CM | POA: Diagnosis not present

## 2017-12-05 DIAGNOSIS — I69354 Hemiplegia and hemiparesis following cerebral infarction affecting left non-dominant side: Secondary | ICD-10-CM | POA: Diagnosis not present

## 2017-12-05 DIAGNOSIS — S4991XD Unspecified injury of right shoulder and upper arm, subsequent encounter: Secondary | ICD-10-CM | POA: Diagnosis not present

## 2017-12-05 DIAGNOSIS — S8992XD Unspecified injury of left lower leg, subsequent encounter: Secondary | ICD-10-CM | POA: Diagnosis not present

## 2017-12-05 DIAGNOSIS — I482 Chronic atrial fibrillation: Secondary | ICD-10-CM | POA: Diagnosis not present

## 2017-12-06 ENCOUNTER — Telehealth: Payer: Self-pay | Admitting: Internal Medicine

## 2017-12-06 ENCOUNTER — Other Ambulatory Visit: Payer: Self-pay

## 2017-12-06 DIAGNOSIS — S8992XD Unspecified injury of left lower leg, subsequent encounter: Secondary | ICD-10-CM | POA: Diagnosis not present

## 2017-12-06 DIAGNOSIS — I10 Essential (primary) hypertension: Secondary | ICD-10-CM | POA: Diagnosis not present

## 2017-12-06 DIAGNOSIS — I69354 Hemiplegia and hemiparesis following cerebral infarction affecting left non-dominant side: Secondary | ICD-10-CM | POA: Diagnosis not present

## 2017-12-06 DIAGNOSIS — I2510 Atherosclerotic heart disease of native coronary artery without angina pectoris: Secondary | ICD-10-CM | POA: Diagnosis not present

## 2017-12-06 DIAGNOSIS — I482 Chronic atrial fibrillation: Secondary | ICD-10-CM | POA: Diagnosis not present

## 2017-12-06 DIAGNOSIS — S4991XD Unspecified injury of right shoulder and upper arm, subsequent encounter: Secondary | ICD-10-CM | POA: Diagnosis not present

## 2017-12-06 NOTE — Telephone Encounter (Signed)
Copied from [place] [phone]. Topic: Quick Communication - Rx Refill/Question >> Dec 06, 2017 11:17 AM Ether Griffins B wrote: Medication: HYDROcodone-acetaminophen (NORCO/VICODIN) 5-325 MG tablet  Pt called 12/03/17 requesting refill   Preferred Pharmacy (with phone number or street name): [phone] - [place], [place]: Please be advised that RX refills may take up to 3 business days. We ask that you follow-up with your pharmacy. >> Dec 06, 2017  3:33 PM Cleaster Corin, NT wrote: Pt. Son calling back requesting HYDROcodone-acetaminophen (NORCO/VICODIN) 5-325 MG tablet Pt called 12/03/17 requesting refill

## 2017-12-06 NOTE — Telephone Encounter (Signed)
Copied from [place] [phone]. Topic: Quick Communication - Rx Refill/Question >> Dec 06, 2017 11:17 AM Ether Griffins B wrote: Medication: HYDROcodone-acetaminophen (NORCO/VICODIN) 5-325 MG tablet  Pt called 12/03/17 requesting refill   Preferred Pharmacy (with phone number or street name): [phone] - [place], [place]: Please be advised that RX refills may take up to 3 business days. We ask that you follow-up with your pharmacy.

## 2017-12-10 ENCOUNTER — Other Ambulatory Visit: Payer: Self-pay

## 2017-12-11 ENCOUNTER — Other Ambulatory Visit: Payer: Self-pay

## 2017-12-11 ENCOUNTER — Telehealth: Payer: Self-pay | Admitting: Internal Medicine

## 2017-12-11 NOTE — Telephone Encounter (Signed)
Okay for tramadol refill

## 2017-12-11 NOTE — Telephone Encounter (Signed)
Left message to return call 

## 2017-12-11 NOTE — Telephone Encounter (Signed)
Quantity and how often and refill status? The medication was taking out of patients chart due to patient stating that it wasn't working for him

## 2017-12-11 NOTE — Telephone Encounter (Signed)
Copied from [place] [id number]. Topic: Quick Communication - Rx Refill/Question >> Dec 11, 2017 10:13 AM Ahmed Prima L wrote: Patient's son called and wanted to check the status of this. He kept demanding to me that it needs to be filled today.

## 2017-12-11 NOTE — Telephone Encounter (Signed)
Copied from [place] [phone]. Topic: Quick Communication - See Telephone Encounter >> Dec 11, 2017 10:11 AM Ahmed Prima L wrote: CRM for notification. See Telephone encounter for: 12/11/17.  traMADol (ULTRAM) tablet 50 mg [facility] [place] [place] AT [facility] [place]

## 2017-12-11 NOTE — Telephone Encounter (Signed)
Tramadol 50 mg #90  1 every 8 hours as needed for pain

## 2017-12-12 ENCOUNTER — Other Ambulatory Visit: Payer: Self-pay

## 2017-12-12 ENCOUNTER — Telehealth: Payer: Self-pay | Admitting: Cardiovascular Disease

## 2017-12-12 MED ORDER — TRAMADOL HCL 50 MG PO TABS: 50.0000 mg | ORAL_TABLET | Freq: Three times a day (TID) | ORAL | 0 refills | Status: DC | PRN

## 2017-12-12 MED ORDER — HYDROCODONE-ACETAMINOPHEN 5-325 MG PO TABS: ORAL_TABLET | ORAL | 0 refills | Status: DC

## 2017-12-12 NOTE — Telephone Encounter (Signed)
Pt called to check about the request for tramadol, contact pt to advise

## 2017-12-12 NOTE — Telephone Encounter (Signed)
Left a message to call back.

## 2017-12-12 NOTE — Telephone Encounter (Signed)
New message    Patient calling to request pain medication to take prior to physical therapy

## 2017-12-13 DIAGNOSIS — J330 Polyp of nasal cavity: Secondary | ICD-10-CM | POA: Diagnosis not present

## 2017-12-13 DIAGNOSIS — Z0000 Encounter for general adult medical examination without abnormal findings: Secondary | ICD-10-CM | POA: Diagnosis not present

## 2017-12-13 DIAGNOSIS — K219 Gastro-esophageal reflux disease without esophagitis: Secondary | ICD-10-CM | POA: Diagnosis not present

## 2017-12-13 DIAGNOSIS — M1990 Unspecified osteoarthritis, unspecified site: Secondary | ICD-10-CM | POA: Diagnosis not present

## 2017-12-13 DIAGNOSIS — F419 Anxiety disorder, unspecified: Secondary | ICD-10-CM | POA: Diagnosis not present

## 2017-12-13 DIAGNOSIS — I482 Chronic atrial fibrillation: Secondary | ICD-10-CM | POA: Diagnosis not present

## 2017-12-13 DIAGNOSIS — I69354 Hemiplegia and hemiparesis following cerebral infarction affecting left non-dominant side: Secondary | ICD-10-CM | POA: Diagnosis not present

## 2017-12-13 DIAGNOSIS — I2510 Atherosclerotic heart disease of native coronary artery without angina pectoris: Secondary | ICD-10-CM | POA: Diagnosis not present

## 2017-12-13 DIAGNOSIS — I10 Essential (primary) hypertension: Secondary | ICD-10-CM | POA: Diagnosis not present

## 2017-12-13 DIAGNOSIS — S8992XD Unspecified injury of left lower leg, subsequent encounter: Secondary | ICD-10-CM | POA: Diagnosis not present

## 2017-12-13 DIAGNOSIS — I4891 Unspecified atrial fibrillation: Secondary | ICD-10-CM | POA: Diagnosis not present

## 2017-12-13 DIAGNOSIS — S4991XD Unspecified injury of right shoulder and upper arm, subsequent encounter: Secondary | ICD-10-CM | POA: Diagnosis not present

## 2017-12-13 NOTE — Telephone Encounter (Signed)
Left message to call back  

## 2017-12-13 NOTE — Telephone Encounter (Signed)
Patient asked for Norco/Vico because Tramadol wasn't working. I spoke to patient [name] and he stated that his dad just need the Hydro/Norco. Medication printed and picked up by [facility] 12/13/2017

## 2017-12-14 ENCOUNTER — Telehealth: Payer: Self-pay | Admitting: Internal Medicine

## 2017-12-14 DIAGNOSIS — I2510 Atherosclerotic heart disease of native coronary artery without angina pectoris: Secondary | ICD-10-CM | POA: Diagnosis not present

## 2017-12-14 DIAGNOSIS — S8992XD Unspecified injury of left lower leg, subsequent encounter: Secondary | ICD-10-CM | POA: Diagnosis not present

## 2017-12-14 DIAGNOSIS — I69354 Hemiplegia and hemiparesis following cerebral infarction affecting left non-dominant side: Secondary | ICD-10-CM | POA: Diagnosis not present

## 2017-12-14 DIAGNOSIS — I10 Essential (primary) hypertension: Secondary | ICD-10-CM | POA: Diagnosis not present

## 2017-12-14 DIAGNOSIS — I482 Chronic atrial fibrillation: Secondary | ICD-10-CM | POA: Diagnosis not present

## 2017-12-14 DIAGNOSIS — S4991XD Unspecified injury of right shoulder and upper arm, subsequent encounter: Secondary | ICD-10-CM | POA: Diagnosis not present

## 2017-12-14 NOTE — Telephone Encounter (Signed)
Patient made aware that according to epic his Tramadol was called in by PCP on 12/12/17. He has verbalized his understanding.

## 2017-12-14 NOTE — Telephone Encounter (Signed)
Copied from [place]. Topic: Quick Communication - See Telephone Encounter >> Dec 14, 2017 11:31 AM Bea Graff, NT wrote: CRM for notification. See Telephone encounter for: 12/14/17. Flonnie Hailstone with [facility]  OT calling to report that today the pts pulse is 104, BP 118/78 and O2 Stat 98. She also would like to continue the OT orders for twice a week for 3 more weeks. CB#: [phone]

## 2017-12-17 NOTE — Telephone Encounter (Signed)
Noted! Okay to continue OT?

## 2017-12-18 NOTE — Telephone Encounter (Signed)
Okay to continue OT

## 2017-12-19 ENCOUNTER — Telehealth: Payer: Self-pay | Admitting: Cardiovascular Disease

## 2017-12-19 DIAGNOSIS — M48061 Spinal stenosis, lumbar region without neurogenic claudication: Secondary | ICD-10-CM | POA: Diagnosis not present

## 2017-12-19 NOTE — Telephone Encounter (Signed)
New Message    Pt c/o medication issue:  1. Name of Medication: Eliquis  2. How are you currently taking this medication (dosage and times per day)? 2.5mg  twice a day  3. Are you having a reaction (difficulty breathing--STAT)? no 4. What is your medication issue? Patient is going to an orthopedic provider and is concerned about being able to receive shots since he is on Eliquis. Please call to discuss.

## 2017-12-19 NOTE — Telephone Encounter (Signed)
Advised pt to have the orthopedic office fax over a clearance form. Pt verbalized understanding.

## 2017-12-19 NOTE — Telephone Encounter (Signed)
Remo Lipps notified of OT orders

## 2017-12-20 DIAGNOSIS — I10 Essential (primary) hypertension: Secondary | ICD-10-CM | POA: Diagnosis not present

## 2017-12-20 DIAGNOSIS — S4991XD Unspecified injury of right shoulder and upper arm, subsequent encounter: Secondary | ICD-10-CM | POA: Diagnosis not present

## 2017-12-20 DIAGNOSIS — I2510 Atherosclerotic heart disease of native coronary artery without angina pectoris: Secondary | ICD-10-CM | POA: Diagnosis not present

## 2017-12-20 DIAGNOSIS — S8992XD Unspecified injury of left lower leg, subsequent encounter: Secondary | ICD-10-CM | POA: Diagnosis not present

## 2017-12-20 DIAGNOSIS — I482 Chronic atrial fibrillation: Secondary | ICD-10-CM | POA: Diagnosis not present

## 2017-12-20 DIAGNOSIS — I69354 Hemiplegia and hemiparesis following cerebral infarction affecting left non-dominant side: Secondary | ICD-10-CM | POA: Diagnosis not present

## 2017-12-21 DIAGNOSIS — I69354 Hemiplegia and hemiparesis following cerebral infarction affecting left non-dominant side: Secondary | ICD-10-CM | POA: Diagnosis not present

## 2017-12-21 DIAGNOSIS — I2510 Atherosclerotic heart disease of native coronary artery without angina pectoris: Secondary | ICD-10-CM | POA: Diagnosis not present

## 2017-12-21 DIAGNOSIS — I482 Chronic atrial fibrillation: Secondary | ICD-10-CM | POA: Diagnosis not present

## 2017-12-21 DIAGNOSIS — I10 Essential (primary) hypertension: Secondary | ICD-10-CM | POA: Diagnosis not present

## 2017-12-21 DIAGNOSIS — S8992XD Unspecified injury of left lower leg, subsequent encounter: Secondary | ICD-10-CM | POA: Diagnosis not present

## 2017-12-21 DIAGNOSIS — S4991XD Unspecified injury of right shoulder and upper arm, subsequent encounter: Secondary | ICD-10-CM | POA: Diagnosis not present

## 2017-12-22 DIAGNOSIS — S8992XD Unspecified injury of left lower leg, subsequent encounter: Secondary | ICD-10-CM | POA: Diagnosis not present

## 2017-12-22 DIAGNOSIS — I10 Essential (primary) hypertension: Secondary | ICD-10-CM | POA: Diagnosis not present

## 2017-12-22 DIAGNOSIS — I69354 Hemiplegia and hemiparesis following cerebral infarction affecting left non-dominant side: Secondary | ICD-10-CM | POA: Diagnosis not present

## 2017-12-22 DIAGNOSIS — I2510 Atherosclerotic heart disease of native coronary artery without angina pectoris: Secondary | ICD-10-CM | POA: Diagnosis not present

## 2017-12-22 DIAGNOSIS — S4991XD Unspecified injury of right shoulder and upper arm, subsequent encounter: Secondary | ICD-10-CM | POA: Diagnosis not present

## 2017-12-22 DIAGNOSIS — I482 Chronic atrial fibrillation: Secondary | ICD-10-CM | POA: Diagnosis not present

## 2017-12-24 ENCOUNTER — Telehealth: Payer: Self-pay | Admitting: Cardiovascular Disease

## 2017-12-24 DIAGNOSIS — S8992XD Unspecified injury of left lower leg, subsequent encounter: Secondary | ICD-10-CM | POA: Diagnosis not present

## 2017-12-24 DIAGNOSIS — I482 Chronic atrial fibrillation: Secondary | ICD-10-CM | POA: Diagnosis not present

## 2017-12-24 DIAGNOSIS — S4991XD Unspecified injury of right shoulder and upper arm, subsequent encounter: Secondary | ICD-10-CM | POA: Diagnosis not present

## 2017-12-24 DIAGNOSIS — I69354 Hemiplegia and hemiparesis following cerebral infarction affecting left non-dominant side: Secondary | ICD-10-CM | POA: Diagnosis not present

## 2017-12-24 DIAGNOSIS — I10 Essential (primary) hypertension: Secondary | ICD-10-CM | POA: Diagnosis not present

## 2017-12-24 DIAGNOSIS — I2510 Atherosclerotic heart disease of native coronary artery without angina pectoris: Secondary | ICD-10-CM | POA: Diagnosis not present

## 2017-12-24 NOTE — Telephone Encounter (Signed)
Left message to call back  

## 2017-12-24 NOTE — Telephone Encounter (Signed)
New Message:    Pt says he might have to have Hemorrhoid surgery and he had a stroke a year ago. He wants to know if Dr Oval Linsey thinks it will be alright to have surgery.

## 2017-12-25 ENCOUNTER — Ambulatory Visit: Payer: Self-pay

## 2017-12-25 DIAGNOSIS — I2510 Atherosclerotic heart disease of native coronary artery without angina pectoris: Secondary | ICD-10-CM | POA: Diagnosis not present

## 2017-12-25 DIAGNOSIS — I10 Essential (primary) hypertension: Secondary | ICD-10-CM | POA: Diagnosis not present

## 2017-12-25 DIAGNOSIS — S4991XD Unspecified injury of right shoulder and upper arm, subsequent encounter: Secondary | ICD-10-CM | POA: Diagnosis not present

## 2017-12-25 DIAGNOSIS — I69354 Hemiplegia and hemiparesis following cerebral infarction affecting left non-dominant side: Secondary | ICD-10-CM | POA: Diagnosis not present

## 2017-12-25 DIAGNOSIS — S8992XD Unspecified injury of left lower leg, subsequent encounter: Secondary | ICD-10-CM | POA: Diagnosis not present

## 2017-12-25 DIAGNOSIS — I482 Chronic atrial fibrillation: Secondary | ICD-10-CM | POA: Diagnosis not present

## 2017-12-25 NOTE — Telephone Encounter (Signed)
Patient has an appointment tomorrow with Endo Dr Fuller Plan for options for banding of hemorrhoids  Patient is taking Eliquis 2.5 mg bid for A FIB and he is asking  recommendations if he should hold  it for the procedure

## 2017-12-25 NOTE — Telephone Encounter (Signed)
Spoke with pt who states he is to have hemorrhoid surgery and wanted to confirm with Dr. Oval Linsey if it was ok for him to proceed. He stated the provider is requesting to have him hold his Eliquis prior to procedure. Instructed pt to have the provider fax over cardiac clearance form explaining what type of procedure will be preformed and how long the Eliquis will need to be held. Pt verbalized understanding.

## 2017-12-26 ENCOUNTER — Other Ambulatory Visit: Payer: Self-pay | Admitting: Internal Medicine

## 2017-12-26 ENCOUNTER — Encounter: Payer: Self-pay | Admitting: Gastroenterology

## 2017-12-26 ENCOUNTER — Ambulatory Visit (INDEPENDENT_AMBULATORY_CARE_PROVIDER_SITE_OTHER): Payer: Medicare Other | Admitting: Gastroenterology

## 2017-12-26 VITALS — BP 110/70 | HR 60

## 2017-12-26 DIAGNOSIS — K625 Hemorrhage of anus and rectum: Secondary | ICD-10-CM

## 2017-12-26 DIAGNOSIS — I6300 Cerebral infarction due to thrombosis of unspecified precerebral artery: Secondary | ICD-10-CM

## 2017-12-26 DIAGNOSIS — K5900 Constipation, unspecified: Secondary | ICD-10-CM

## 2017-12-26 DIAGNOSIS — I480 Paroxysmal atrial fibrillation: Secondary | ICD-10-CM | POA: Diagnosis not present

## 2017-12-26 DIAGNOSIS — Z7901 Long term (current) use of anticoagulants: Secondary | ICD-10-CM | POA: Diagnosis not present

## 2017-12-26 DIAGNOSIS — I10 Essential (primary) hypertension: Secondary | ICD-10-CM | POA: Diagnosis not present

## 2017-12-26 MED ORDER — HYDROCORTISONE ACETATE 25 MG RE SUPP: 25.0000 mg | Freq: Two times a day (BID) | RECTAL | 0 refills | Status: DC

## 2017-12-26 NOTE — Telephone Encounter (Signed)
Spoke to patient and he stated the Dr. Rockey Situ him the procedure was too risky. No further action needed.

## 2017-12-26 NOTE — Patient Instructions (Signed)
We have sent the following medications to your pharmacy for you to pick up at your convenience: Anusol suppositories.   Take over the counter Miralax mixing 17 grams in 8 oz of water daily.   Thank you for choosing me and [facility].  Pricilla Riffle. Dagoberto Ligas., MD., Marval Regal

## 2017-12-26 NOTE — Telephone Encounter (Signed)
If hemorrhoidal banding is decided upon, we will then discuss anticoagulation options

## 2017-12-26 NOTE — Progress Notes (Signed)
History of Present Illness: This is an 84 year old male self referred for the evaluation of constipation and rectal bleeding. Caregiver with him today and no family members. CVA on Eliquis in 01/2017. Constipation improved taking apple juice and oatmeal regularly. [email or web address] rectal bleeding on tissue with wiping following bowel movements. Rectal bleeding has decreased with improved constipation. Denies weight loss, abdominal pain, diarrhea, change in stool caliber, melena, nausea, vomiting, dysphagia, reflux symptoms, chest pain.  Colonoscopy 2002: diverticulosis, polypoid mucosa, internal hemorrhoids-injected    Allergies  Allergen Reactions  . Novocain [Procaine Hcl]     Irregular heart beat     . Penicillins Itching    Has patient had a PCN reaction causing immediate rash, facial/tongue/throat swelling, SOB or lightheadedness with hypotension: Unk Has patient had a PCN reaction causing severe rash involving mucus membranes or skin necrosis: Unk Has patient had a PCN reaction that required hospitalization: Unk Has patient had a PCN reaction occurring within the last 10 years: No If all of the above answers are "NO", then may proceed with Cephalosporin use.    Outpatient Medications Prior to Visit  Medication Sig Dispense Refill  . ALPRAZolam (XANAX) 0.25 MG tablet TAKE 1 TABLET BY MOUTH AT BEDTIME 90 tablet 0  . benazepril (LOTENSIN) 5 MG tablet Take 1 tablet (5 mg total) by mouth daily. 90 tablet 3  . Catheters (GIZMO CONDOM CATHETER) MISC USE AS NEEDED 100 each 0  . ELIQUIS 2.5 MG TABS tablet TAKE 1 TABLET BY MOUTH TWICE DAILY 60 tablet 0  . finasteride (PROSCAR) 5 MG tablet Take 1 tablet (5 mg total) daily by mouth. 90 tablet 3  . HYDROcodone-acetaminophen (NORCO/VICODIN) 5-325 MG tablet 1 tablet every 8-12 hours as needed for pain not to exceed 2 tablets in a 24-hour. 90 tablet 0  . hydrocortisone (ANUSOL-HC) 2.5 % rectal cream Place 1 application rectally 2 (two)  times daily. 30 g 0  . ipratropium (ATROVENT) 0.06 % nasal spray USE 2 SPRAYS IN EACH NOSTRIL FOUR TIMES DAILY 15 mL 0  . loratadine (CLARITIN) 10 MG tablet Take 10 mg by mouth daily.  1  . metoprolol succinate (TOPROL-XL) 25 MG 24 hr tablet TAKE 1 TABLET BY MOUTH EVERY MORNING AND TAKE 2 TABLETS BY MOUTH EVERY EVENING 270 tablet 0  . pantoprazole (PROTONIX) 40 MG tablet Take 1 tablet (40 mg total) daily by mouth. 90 tablet 3  . pravastatin (PRAVACHOL) 20 MG tablet Take 1 tablet (20 mg total) daily by mouth. 90 tablet 3  . PROCTOZONE-HC 2.5 % rectal cream APPLY 1 APPLICATORFUL EXTERNALLY TO THE AFFECTED AREA RECTALLY TWICE DAILY 30 g 0  . sertraline (ZOLOFT) 25 MG tablet Take 1 tablet (25 mg total) daily by mouth. 30 tablet 5  . traMADol (ULTRAM) 50 MG tablet Take 1 tablet (50 mg total) by mouth every 8 (eight) hours as needed. 90 tablet 0   No facility-administered medications prior to visit.    Past Medical History:  Diagnosis Date  . Anemia   . Atrial flutter ([place])   . BPH (benign prostatic hypertrophy)   . Epistaxis   . Hemorrhage of gastrointestinal tract, unspecified   . History of open heart surgery   . Hypertension   . Osteoarthrosis, unspecified whether generalized or localized, unspecified site   . Personal history of venous thrombosis and embolism   . Rosacea   . Stroke ([place])   . TIA (transient ischemic attack)    Past Surgical History:  Procedure Laterality Date  .  APPENDECTOMY    . HERNIA REPAIR    . IR GENERIC HISTORICAL  05/04/2016   IR PERCUTANEOUS ART THROMBECTOMY/INFUSION INTRACRANIAL INC DIAG ANGIO 05/04/2016 [name], MD [facility]  . IR GENERIC HISTORICAL  05/04/2016   IR ANGIO VERTEBRAL SEL SUBCLAVIAN INNOMINATE UNI R MOD SED 05/04/2016 [name], MD [facility]  . IR GENERIC HISTORICAL  06/07/2016   IR RADIOLOGIST EVAL & MGMT 06/07/2016 [facility]  . JOINT REPLACEMENT    . MITRAL VALVE REPAIR    . mvp repair    . RADIOLOGY WITH  ANESTHESIA N/A 05/04/2016   Procedure: RADIOLOGY WITH ANESTHESIA;  Surgeon: [name], MD;  Location: [facility];  Service: Radiology;  Laterality: N/A;  . TOTAL HIP ARTHROPLASTY     Social History   Socioeconomic History  . Marital status: Married    Spouse name: Not on file  . Number of children: Not on file  . Years of education: Not on file  . Highest education level: Not on file  Occupational History  . Not on file  Social Needs  . Financial resource strain: Not on file  . Food insecurity:    Worry: Not on file    Inability: Not on file  . Transportation needs:    Medical: Not on file    Non-medical: Not on file  Tobacco Use  . Smoking status: Former Research scientist (life sciences)  . Smokeless tobacco: Never Used  . Tobacco comment: quit 50 yr ago  Substance and Sexual Activity  . Alcohol use: No  . Drug use: No  . Sexual activity: Not on file  Lifestyle  . Physical activity:    Days per week: Not on file    Minutes per session: Not on file  . Stress: Not on file  Relationships  . Social connections:    Talks on phone: Not on file    Gets together: Not on file    Attends religious service: Not on file    Active member of club or organization: Not on file    Attends meetings of clubs or organizations: Not on file    Relationship status: Not on file  Other Topics Concern  . Not on file  Social History Narrative  . Not on file   Family History  Problem Relation Age of Onset  . Rheumatic fever Mother   . Coronary artery disease Father       Review of Systems: Pertinent positive and negative review of systems were noted in the above HPI section. All other review of systems were otherwise negative.    Physical Exam: General: Well developed, well nourished, elderly, thin, wheelchair, no acute distress Head: Normocephalic and atraumatic Eyes:  sclerae anicteric, EOMI Ears: Normal auditory acuity Mouth: No deformity or lesions Neck: Supple, no masses or thyromegaly Lungs:  Clear throughout to auscultation Heart: Regular rate and rhythm; no murmurs, rubs or bruits Abdomen: Soft, non tender and non distended. No masses, hepatosplenomegaly or hernias noted. Normal Bowel sounds Rectal: DRE shows a large stool burden in the rectal vault, no lesions, heme neg stool, anoscopy showed 3 internal hemorrhoids and 1 had a spot of BRB Musculoskeletal: Symmetrical with no gross deformities  Skin: No lesions on visible extremities Pulses:  Normal pulses noted Extremities: No clubbing, cyanosis, edema or deformities noted Neurological: Alert oriented x 4, left hemiparesis Cervical Nodes:  No significant cervical adenopathy Inguinal Nodes: No significant inguinal adenopathy Psychological:  Alert and cooperative. Normal mood and affect  Assessment and Recommendations:  1.  Constipation. Hematochezia likely due to internal hemorrhoids.  High-fiber diet and adequate daily fluid intake long-term.  MiraLAX once daily to further assist with management of constipation.  Anusol HC suppositories daily as needed.  Further evaluation with colonoscopy to exclude other causes of bleeding is reasonable however oatient would like to avoid holding anticoagulation for a procedure if possible.  If rectal bleeding persists we need to revisit colonoscopy.  We discussed he is not an ideal candidate for hemorrhoidal banding given his ongoing anticoagulation use and the need to hold anticoagulation if he were to have a bleeding complication with banding.  Optimal management of constipation and suppositories for hemorrhoidal  symptoms are the safest, lowest risk approach.  He is in agreement.  2.  Atrial fibrillation, history of CVA, chronic anticoagulation with Eliquis.   3.  History of bleeding duodenal ulcer in 2002. Recommend pantoprazole 40 mg daily long-term.  Avoid aspirin and NSAIDs.

## 2017-12-27 DIAGNOSIS — S4991XD Unspecified injury of right shoulder and upper arm, subsequent encounter: Secondary | ICD-10-CM | POA: Diagnosis not present

## 2017-12-27 DIAGNOSIS — S8992XD Unspecified injury of left lower leg, subsequent encounter: Secondary | ICD-10-CM | POA: Diagnosis not present

## 2017-12-27 DIAGNOSIS — I482 Chronic atrial fibrillation: Secondary | ICD-10-CM | POA: Diagnosis not present

## 2017-12-27 DIAGNOSIS — I69354 Hemiplegia and hemiparesis following cerebral infarction affecting left non-dominant side: Secondary | ICD-10-CM | POA: Diagnosis not present

## 2017-12-27 DIAGNOSIS — I10 Essential (primary) hypertension: Secondary | ICD-10-CM | POA: Diagnosis not present

## 2017-12-27 DIAGNOSIS — I2510 Atherosclerotic heart disease of native coronary artery without angina pectoris: Secondary | ICD-10-CM | POA: Diagnosis not present

## 2017-12-28 DIAGNOSIS — M19012 Primary osteoarthritis, left shoulder: Secondary | ICD-10-CM | POA: Diagnosis not present

## 2017-12-28 DIAGNOSIS — M1712 Unilateral primary osteoarthritis, left knee: Secondary | ICD-10-CM | POA: Diagnosis not present

## 2017-12-28 DIAGNOSIS — M25512 Pain in left shoulder: Secondary | ICD-10-CM | POA: Insufficient documentation

## 2017-12-31 ENCOUNTER — Encounter: Payer: Self-pay | Admitting: Internal Medicine

## 2017-12-31 ENCOUNTER — Telehealth: Payer: Self-pay | Admitting: Family Medicine

## 2017-12-31 ENCOUNTER — Other Ambulatory Visit: Payer: Self-pay | Admitting: Internal Medicine

## 2017-12-31 ENCOUNTER — Ambulatory Visit (INDEPENDENT_AMBULATORY_CARE_PROVIDER_SITE_OTHER): Payer: Medicare Other | Admitting: Internal Medicine

## 2017-12-31 VITALS — BP 100/68 | HR 76 | Temp 98.1°F

## 2017-12-31 DIAGNOSIS — R7302 Impaired glucose tolerance (oral): Secondary | ICD-10-CM | POA: Diagnosis not present

## 2017-12-31 DIAGNOSIS — I10 Essential (primary) hypertension: Secondary | ICD-10-CM | POA: Diagnosis not present

## 2017-12-31 DIAGNOSIS — I480 Paroxysmal atrial fibrillation: Secondary | ICD-10-CM | POA: Diagnosis not present

## 2017-12-31 DIAGNOSIS — I2510 Atherosclerotic heart disease of native coronary artery without angina pectoris: Secondary | ICD-10-CM | POA: Diagnosis not present

## 2017-12-31 DIAGNOSIS — I6300 Cerebral infarction due to thrombosis of unspecified precerebral artery: Secondary | ICD-10-CM

## 2017-12-31 LAB — CBC WITH DIFFERENTIAL/PLATELET
BASOS PCT: 0.5 % (ref 0.0–3.0)
Basophils Absolute: 0 10*3/uL (ref 0.0–0.1)
EOS ABS: 0 10*3/uL (ref 0.0–0.7)
Eosinophils Relative: 0.2 % (ref 0.0–5.0)
HCT: 45.3 % (ref 39.0–52.0)
HEMOGLOBIN: 15.5 g/dL (ref 13.0–17.0)
LYMPHS PCT: 17.3 % (ref 12.0–46.0)
Lymphs Abs: 1.7 10*3/uL (ref 0.7–4.0)
MCHC: 34.2 g/dL (ref 30.0–36.0)
MCV: 95.4 fl (ref 78.0–100.0)
MONOS PCT: 10 % (ref 3.0–12.0)
Monocytes Absolute: 1 10*3/uL (ref 0.1–1.0)
NEUTROS ABS: 6.9 10*3/uL (ref 1.4–7.7)
Neutrophils Relative %: 72 % (ref 43.0–77.0)
Platelets: 294 10*3/uL (ref 150.0–400.0)
RBC: 4.74 Mil/uL (ref 4.22–5.81)
RDW: 14 % (ref 11.5–15.5)
WBC: 9.6 10*3/uL (ref 4.0–10.5)

## 2017-12-31 LAB — COMPREHENSIVE METABOLIC PANEL
ALBUMIN: 3.8 g/dL (ref 3.5–5.2)
ALT: 25 U/L (ref 0–53)
AST: 21 U/L (ref 0–37)
Alkaline Phosphatase: 72 U/L (ref 39–117)
BUN: 18 mg/dL (ref 6–23)
CALCIUM: 9.1 mg/dL (ref 8.4–10.5)
CHLORIDE: 98 meq/L (ref 96–112)
CO2: 31 meq/L (ref 19–32)
CREATININE: 0.48 mg/dL (ref 0.40–1.50)
GFR: 176.93 mL/min (ref >60.00)
Glucose, Bld: 109 mg/dL — ABNORMAL HIGH (ref 70–99)
POTASSIUM: 4.4 meq/L (ref 3.5–5.1)
Sodium: 133 mEq/L — ABNORMAL LOW (ref 135–145)
Total Bilirubin: 0.5 mg/dL (ref 0.2–1.2)
Total Protein: 6.1 g/dL (ref 6.0–8.3)

## 2017-12-31 LAB — HEMOGLOBIN A1C: Hgb A1c MFr Bld: 5.2 % (ref 4.6–6.5)

## 2017-12-31 MED ORDER — PRAVASTATIN SODIUM 20 MG PO TABS: 20.0000 mg | ORAL_TABLET | Freq: Every day | ORAL | 3 refills | Status: DC

## 2017-12-31 MED ORDER — FINASTERIDE 5 MG PO TABS: 5.0000 mg | ORAL_TABLET | Freq: Every day | ORAL | 3 refills | Status: DC

## 2017-12-31 MED ORDER — HYDROCODONE-ACETAMINOPHEN 5-325 MG PO TABS: ORAL_TABLET | ORAL | 0 refills | Status: DC

## 2017-12-31 MED ORDER — APIXABAN 2.5 MG PO TABS: 2.5000 mg | ORAL_TABLET | Freq: Two times a day (BID) | ORAL | 6 refills | Status: DC

## 2017-12-31 MED ORDER — METOPROLOL SUCCINATE ER 25 MG PO TB24: ORAL_TABLET | ORAL | 3 refills | Status: DC

## 2017-12-31 MED ORDER — ALPRAZOLAM 0.25 MG PO TABS: 0.2500 mg | ORAL_TABLET | Freq: Every day | ORAL | 0 refills | Status: DC

## 2017-12-31 MED ORDER — BENAZEPRIL HCL 5 MG PO TABS: 5.0000 mg | ORAL_TABLET | Freq: Every day | ORAL | 3 refills | Status: DC

## 2017-12-31 MED ORDER — SERTRALINE HCL 25 MG PO TABS: 25.0000 mg | ORAL_TABLET | Freq: Every day | ORAL | 5 refills | Status: DC

## 2017-12-31 MED ORDER — TRAMADOL HCL 50 MG PO TABS: 50.0000 mg | ORAL_TABLET | Freq: Three times a day (TID) | ORAL | 0 refills | Status: DC | PRN

## 2017-12-31 NOTE — Progress Notes (Signed)
Subjective:    Patient ID: Brian [name], male    DOB: 31-Dec-1932, 84 y.o.   MRN: [id number]  HPI  84 year old patient who has a history of chronic atrial fibrillation.  He remains on chronic anticoagulation.  He has a history of essential hypertension. He has cerebrovascular disease and a left-sided hemiparesis. His chief complaint is osteoarthritic pain involving primarily shoulder back hip and knee.  He is followed by orthopedics.  He remains on analgesics with marginal benefit. He has coronary artery disease which has been stable.  Denies any exertional chest pain or shortness of breath although his activities are quite limited. He requests refills on all his medications  Past Medical History:  Diagnosis Date  . Anemia   . Atrial flutter ([place])   . BPH (benign prostatic hypertrophy)   . Epistaxis   . Hemorrhage of gastrointestinal tract, unspecified   . History of open heart surgery   . Hypertension   . Osteoarthrosis, unspecified whether generalized or localized, unspecified site   . Personal history of venous thrombosis and embolism   . Rosacea   . Stroke ([place])   . TIA (transient ischemic attack)      Social History   Socioeconomic History  . Marital status: Married    Spouse name: Not on file  . Number of children: Not on file  . Years of education: Not on file  . Highest education level: Not on file  Occupational History  . Not on file  Social Needs  . Financial resource strain: Not on file  . Food insecurity:    Worry: Not on file    Inability: Not on file  . Transportation needs:    Medical: Not on file    Non-medical: Not on file  Tobacco Use  . Smoking status: Former Research scientist (life sciences)  . Smokeless tobacco: Never Used  . Tobacco comment: quit 50 yr ago  Substance and Sexual Activity  . Alcohol use: No  . Drug use: No  . Sexual activity: Not on file  Lifestyle  . Physical activity:    Days per week: Not on file    Minutes per session: Not on file  . Stress:  Not on file  Relationships  . Social connections:    Talks on phone: Not on file    Gets together: Not on file    Attends religious service: Not on file    Active member of club or organization: Not on file    Attends meetings of clubs or organizations: Not on file    Relationship status: Not on file  . Intimate partner violence:    Fear of current or ex partner: Not on file    Emotionally abused: Not on file    Physically abused: Not on file    Forced sexual activity: Not on file  Other Topics Concern  . Not on file  Social History Narrative  . Not on file    Past Surgical History:  Procedure Laterality Date  . APPENDECTOMY    . HERNIA REPAIR    . IR GENERIC HISTORICAL  05/04/2016   IR PERCUTANEOUS ART THROMBECTOMY/INFUSION INTRACRANIAL INC DIAG ANGIO 05/04/2016 [name], MD [facility]  . IR GENERIC HISTORICAL  05/04/2016   IR ANGIO VERTEBRAL SEL SUBCLAVIAN INNOMINATE UNI R MOD SED 05/04/2016 [name], MD [facility]  . IR GENERIC HISTORICAL  06/07/2016   IR RADIOLOGIST EVAL & MGMT 06/07/2016 [facility]  . JOINT REPLACEMENT    . MITRAL VALVE REPAIR    .  mvp repair    . RADIOLOGY WITH ANESTHESIA N/A 05/04/2016   Procedure: RADIOLOGY WITH ANESTHESIA;  Surgeon: [name], MD;  Location: [facility];  Service: Radiology;  Laterality: N/A;  . TOTAL HIP ARTHROPLASTY      Family History  Problem Relation Age of Onset  . Rheumatic fever Mother   . Coronary artery disease Father     Allergies  Allergen Reactions  . Novocain [Procaine Hcl]     Irregular heart beat     . Penicillins Itching    Has patient had a PCN reaction causing immediate rash, facial/tongue/throat swelling, SOB or lightheadedness with hypotension: Unk Has patient had a PCN reaction causing severe rash involving mucus membranes or skin necrosis: Unk Has patient had a PCN reaction that required hospitalization: Unk Has patient had a PCN reaction occurring within the last 10 years:  No If all of the above answers are "NO", then may proceed with Cephalosporin use.     Current Outpatient Medications on File Prior to Visit  Medication Sig Dispense Refill  . Catheters (GIZMO CONDOM CATHETER) MISC USE AS NEEDED 100 each 0  . hydrocortisone (ANUSOL-HC) 2.5 % rectal cream Place 1 application rectally 2 (two) times daily. 30 g 0  . hydrocortisone (ANUSOL-HC) 25 MG suppository Place 1 suppository (25 mg total) rectally 2 (two) times daily. 12 suppository 0  . ipratropium (ATROVENT) 0.06 % nasal spray USE 2 SPRAYS IN EACH NOSTRIL FOUR TIMES DAILY 15 mL 0  . loratadine (CLARITIN) 10 MG tablet Take 10 mg by mouth daily.  1  . pantoprazole (PROTONIX) 40 MG tablet Take 1 tablet (40 mg total) daily by mouth. 90 tablet 3  . PROCTOZONE-HC 2.5 % rectal cream APPLY 1 APPLICATORFUL EXTERNALLY TO THE AFFECTED AREA RECTALLY TWICE DAILY 30 g 0   No current facility-administered medications on file prior to visit.     BP 100/68 (BP Location: Right Arm, Patient Position: Sitting, Cuff Size: Normal)   Pulse 76   Temp 98.1 F (36.7 C) (Oral)   SpO2 96%     Review of Systems  Constitutional: Positive for fatigue. Negative for appetite change, chills and fever.  HENT: Negative for congestion, dental problem, ear pain, hearing loss, sore throat, tinnitus, trouble swallowing and voice change.   Eyes: Negative for pain, discharge and visual disturbance.  Respiratory: Negative for cough, chest tightness, wheezing and stridor.   Cardiovascular: Negative for chest pain, palpitations and leg swelling.  Gastrointestinal: Negative for abdominal distention, abdominal pain, blood in stool, constipation, diarrhea, nausea and vomiting.  Genitourinary: Negative for difficulty urinating, discharge, flank pain, genital sores, hematuria and urgency.  Musculoskeletal: Positive for arthralgias, back pain and gait problem. Negative for joint swelling, myalgias and neck stiffness.  Skin: Negative for rash.    Neurological: Positive for weakness. Negative for dizziness, syncope, speech difficulty, numbness and headaches.  Hematological: Negative for adenopathy. Does not bruise/bleed easily.  Psychiatric/Behavioral: Negative for behavioral problems and dysphoric mood. The patient is nervous/anxious.        Objective:   Physical Exam  Constitutional: He is oriented to person, place, and time. He appears well-developed.  Blood pressure well controlled  HENT:  Head: Normocephalic.  Right Ear: External ear normal.  Left Ear: External ear normal.  Eyes: Conjunctivae and EOM are normal.  Neck: Normal range of motion.  Cardiovascular: Normal rate and normal heart sounds.  Irregular with a controlled ventricular response  Pulmonary/Chest: Effort normal and breath sounds normal. He has no rales.  Abdominal:  Bowel sounds are normal.  Musculoskeletal: Normal range of motion. He exhibits no edema or tenderness.  Neurological: He is alert and oriented to person, place, and time.  Left hemiparesis.  nonambulatory  Psychiatric: He has a normal mood and affect. His behavior is normal.          Assessment & Plan:   Chronic atrial fibrillation.  Continue rate control and chronic anticoagulation Essential hypertension stable Cerebrovascular disease Coronary artery disease stable  All medications updated Check updated lab Follow-up 3 months  Cydni Reddoch Pilar Plate

## 2017-12-31 NOTE — Patient Instructions (Signed)
Limit your sodium (Salt) intake  Orthopedic follow-up as scheduled  Return here in 3 months or as needed

## 2017-12-31 NOTE — Telephone Encounter (Signed)
Copied from [place] [id number]. Topic: Quick Communication - See Telephone Encounter >> Dec 31, 2017  3:11 PM Antonieta Iba C wrote: CRM for notification. See Telephone encounter for: 12/31/17.  Pt's spouse is calling in to request a call back. She said that she and pt is planning a trip to [facility] next year. She would like to know if there are any precautions that she should take considering pt's (spouse) current condition?   Please advise.

## 2018-01-01 DIAGNOSIS — I2510 Atherosclerotic heart disease of native coronary artery without angina pectoris: Secondary | ICD-10-CM | POA: Diagnosis not present

## 2018-01-01 DIAGNOSIS — I10 Essential (primary) hypertension: Secondary | ICD-10-CM | POA: Diagnosis not present

## 2018-01-01 DIAGNOSIS — I69354 Hemiplegia and hemiparesis following cerebral infarction affecting left non-dominant side: Secondary | ICD-10-CM | POA: Diagnosis not present

## 2018-01-01 DIAGNOSIS — I482 Chronic atrial fibrillation: Secondary | ICD-10-CM | POA: Diagnosis not present

## 2018-01-01 DIAGNOSIS — S4991XD Unspecified injury of right shoulder and upper arm, subsequent encounter: Secondary | ICD-10-CM | POA: Diagnosis not present

## 2018-01-01 DIAGNOSIS — S8992XD Unspecified injury of left lower leg, subsequent encounter: Secondary | ICD-10-CM | POA: Diagnosis not present

## 2018-01-01 NOTE — Telephone Encounter (Signed)
Dr. Kwiatkowski please advise. 

## 2018-01-02 ENCOUNTER — Telehealth: Payer: Self-pay | Admitting: Internal Medicine

## 2018-01-02 DIAGNOSIS — S4991XD Unspecified injury of right shoulder and upper arm, subsequent encounter: Secondary | ICD-10-CM | POA: Diagnosis not present

## 2018-01-02 DIAGNOSIS — S8992XD Unspecified injury of left lower leg, subsequent encounter: Secondary | ICD-10-CM | POA: Diagnosis not present

## 2018-01-02 DIAGNOSIS — I69354 Hemiplegia and hemiparesis following cerebral infarction affecting left non-dominant side: Secondary | ICD-10-CM | POA: Diagnosis not present

## 2018-01-02 DIAGNOSIS — I10 Essential (primary) hypertension: Secondary | ICD-10-CM | POA: Diagnosis not present

## 2018-01-02 DIAGNOSIS — I2510 Atherosclerotic heart disease of native coronary artery without angina pectoris: Secondary | ICD-10-CM | POA: Diagnosis not present

## 2018-01-02 DIAGNOSIS — I482 Chronic atrial fibrillation: Secondary | ICD-10-CM | POA: Diagnosis not present

## 2018-01-02 NOTE — Telephone Encounter (Signed)
Copied from [place] [phone]. Topic: Quick Communication - Rx Refill/Question >> Jan 02, 2018 12:15 PM Oliver Pila B wrote: Medication: HYDROcodone-acetaminophen (NORCO/VICODIN) 5-325 MG tablet [[id number]] , traMADol (ULTRAM) 50 MG tablet [[id number]]  Pharmacist called to ask about the medication fill dates above b/c they are early for refill request, call pharmacy to advise

## 2018-01-02 NOTE — Telephone Encounter (Signed)
Hydrocodone acetaminophen Ultram LOV: 12/31/17  PCP: Dr Inda Merlin Last Refill: ? Pharmacy: [facility]

## 2018-01-04 ENCOUNTER — Other Ambulatory Visit: Payer: Self-pay | Admitting: Gastroenterology

## 2018-01-04 DIAGNOSIS — I69354 Hemiplegia and hemiparesis following cerebral infarction affecting left non-dominant side: Secondary | ICD-10-CM | POA: Diagnosis not present

## 2018-01-04 DIAGNOSIS — S8992XD Unspecified injury of left lower leg, subsequent encounter: Secondary | ICD-10-CM | POA: Diagnosis not present

## 2018-01-04 DIAGNOSIS — S4991XD Unspecified injury of right shoulder and upper arm, subsequent encounter: Secondary | ICD-10-CM | POA: Diagnosis not present

## 2018-01-04 DIAGNOSIS — I10 Essential (primary) hypertension: Secondary | ICD-10-CM | POA: Diagnosis not present

## 2018-01-04 DIAGNOSIS — I482 Chronic atrial fibrillation: Secondary | ICD-10-CM | POA: Diagnosis not present

## 2018-01-04 DIAGNOSIS — I2510 Atherosclerotic heart disease of native coronary artery without angina pectoris: Secondary | ICD-10-CM | POA: Diagnosis not present

## 2018-01-07 ENCOUNTER — Telehealth: Payer: Self-pay | Admitting: Gastroenterology

## 2018-01-07 ENCOUNTER — Other Ambulatory Visit: Payer: Self-pay | Admitting: Internal Medicine

## 2018-01-07 DIAGNOSIS — S4991XD Unspecified injury of right shoulder and upper arm, subsequent encounter: Secondary | ICD-10-CM | POA: Diagnosis not present

## 2018-01-07 DIAGNOSIS — I10 Essential (primary) hypertension: Secondary | ICD-10-CM | POA: Diagnosis not present

## 2018-01-07 DIAGNOSIS — I69354 Hemiplegia and hemiparesis following cerebral infarction affecting left non-dominant side: Secondary | ICD-10-CM | POA: Diagnosis not present

## 2018-01-07 DIAGNOSIS — I482 Chronic atrial fibrillation: Secondary | ICD-10-CM | POA: Diagnosis not present

## 2018-01-07 DIAGNOSIS — I2510 Atherosclerotic heart disease of native coronary artery without angina pectoris: Secondary | ICD-10-CM | POA: Diagnosis not present

## 2018-01-07 DIAGNOSIS — S8992XD Unspecified injury of left lower leg, subsequent encounter: Secondary | ICD-10-CM | POA: Diagnosis not present

## 2018-01-07 NOTE — Telephone Encounter (Signed)
Refill request Tramadol 50 MG   LOV 12/31/2017  Dr Burnice Logan  Last Filled Printed  RX  12/31/2017   90 TABS  1 TAB EVERY [place] [id number]  [phone]

## 2018-01-07 NOTE — Telephone Encounter (Unsigned)
Copied from [place] [phone]. Topic: Quick Communication - Rx Refill/Question >> Jan 07, 2018  4:42 PM Bea Graff, NT wrote: Medication: ALPRAZolam Duanne Moron) 0.25 MG tablet, HYDROcodone-acetaminophen (NORCO/VICODIN) 5-325 MG tablet, and traMADol (ULTRAM) 50 MG tablet   Has the patient contacted their pharmacy? Yes.   (Agent: If no, request that the patient contact the pharmacy for the refill.) (Agent: If yes, when and what did the pharmacy advise?)  Preferred Pharmacy (with phone number or street name): [facility] [place] [place] [phone] (Phone) [phone] (Fax)    Pts son calling and states pharmacy did not receive these medications on 12/31/17 and that his dad is in a lot of pain.  Agent: Please be advised that RX refills may take up to 3 business days. We ask that you follow-up with your pharmacy.

## 2018-01-07 NOTE — Telephone Encounter (Addendum)
Spoke  To  Raquel Sarna at   [facility]  In  [place]  Pt  Received   Tramadol   90  Tablets  Jan 02 2018 meds were picked up at the pharmacy   Called the  Patient   At [phone]   and advised him of above . He put his [name]  On Telephone   And  Son notified of above. Son  states patient  is running low  And is requesting more Tramadol.

## 2018-01-07 NOTE — Telephone Encounter (Signed)
Prescription sent to patient's pharmacy and patient notified.  

## 2018-01-07 NOTE — Telephone Encounter (Addendum)
Attempted  To  Call  Pt at home  Number wife answered and advised call [phone]

## 2018-01-07 NOTE — Telephone Encounter (Signed)
Copied from [place] [id number]. Topic: Quick Communication - Rx Refill/Question >> Jan 07, 2018  9:15 AM Arletha Grippe wrote: Medication: traMADol (ULTRAM) 50 MG tablet  Has the patient contacted their pharmacy? Yes.   (Agent: If no, request that the patient contact the pharmacy for the refill.) (Agent: If yes, when and what did the pharmacy advise?)  Preferred Pharm[facility] (with phone number [facility] street [facility] [place]  Agent: Please be advised that RX refills may take up to 3 business days. We ask that you follow-up with your pharmacy.  Son says this is needed right away

## 2018-01-08 DIAGNOSIS — I2510 Atherosclerotic heart disease of native coronary artery without angina pectoris: Secondary | ICD-10-CM | POA: Diagnosis not present

## 2018-01-08 DIAGNOSIS — S8992XD Unspecified injury of left lower leg, subsequent encounter: Secondary | ICD-10-CM | POA: Diagnosis not present

## 2018-01-08 DIAGNOSIS — S4991XD Unspecified injury of right shoulder and upper arm, subsequent encounter: Secondary | ICD-10-CM | POA: Diagnosis not present

## 2018-01-08 DIAGNOSIS — I69354 Hemiplegia and hemiparesis following cerebral infarction affecting left non-dominant side: Secondary | ICD-10-CM | POA: Diagnosis not present

## 2018-01-08 DIAGNOSIS — I482 Chronic atrial fibrillation: Secondary | ICD-10-CM | POA: Diagnosis not present

## 2018-01-08 DIAGNOSIS — I10 Essential (primary) hypertension: Secondary | ICD-10-CM | POA: Diagnosis not present

## 2018-01-08 NOTE — Telephone Encounter (Signed)
Patient's son calling to check on these prescription refills. Please advise. CB#: [phone]

## 2018-01-09 NOTE — Telephone Encounter (Signed)
Is this pt suppose to be taking all these controls.  Please advise so that I can refill as appropriate.

## 2018-01-09 NOTE — Telephone Encounter (Signed)
Pt cannot have Hydrocodone refilled in between appointments. [name] was already aware of this. Pt will received this at next pain management apt.

## 2018-01-09 NOTE — Telephone Encounter (Signed)
All medications were refilled on March 15

## 2018-01-09 NOTE — Telephone Encounter (Signed)
Brian [name], daughter called and said the pharmacy told her it was to early to fill HYDROcodone-acetaminophen (NORCO/VICODIN) 5-325 MG tablet. He has 3 more left and is taking it every 8 hours as needed for pain. That is how it is prescribed per Roxanne. Please advise. Since it says " do not exceed 2 in a day " is why they can not fill it as well.

## 2018-01-09 NOTE — Telephone Encounter (Signed)
Per pharmacy rx is not available for fill until 01/28/18 per insurance. Rx was written to not exceed 2 tabs in 24 hours, which then makes #90 a 45 day supply. This is how insurance is reading the rx. Per message from daughter pt has been taking more than 2 per day.  Last fill was 12/12/17 #90  Dr. Raliegh Ip - Please advise. Thanks!

## 2018-01-10 ENCOUNTER — Telehealth: Payer: Self-pay | Admitting: Internal Medicine

## 2018-01-10 DIAGNOSIS — S8992XD Unspecified injury of left lower leg, subsequent encounter: Secondary | ICD-10-CM | POA: Diagnosis not present

## 2018-01-10 DIAGNOSIS — I69354 Hemiplegia and hemiparesis following cerebral infarction affecting left non-dominant side: Secondary | ICD-10-CM | POA: Diagnosis not present

## 2018-01-10 DIAGNOSIS — I482 Chronic atrial fibrillation: Secondary | ICD-10-CM | POA: Diagnosis not present

## 2018-01-10 DIAGNOSIS — S4991XD Unspecified injury of right shoulder and upper arm, subsequent encounter: Secondary | ICD-10-CM | POA: Diagnosis not present

## 2018-01-10 DIAGNOSIS — I10 Essential (primary) hypertension: Secondary | ICD-10-CM | POA: Diagnosis not present

## 2018-01-10 DIAGNOSIS — I2510 Atherosclerotic heart disease of native coronary artery without angina pectoris: Secondary | ICD-10-CM | POA: Diagnosis not present

## 2018-01-10 NOTE — Telephone Encounter (Unsigned)
Copied from [place] [id number]. Topic: Quick Communication - See Telephone Encounter >> Jan 10, 2018 12:47 PM Hewitt Shorts wrote: Pt is needing a refill on his hydrocodone   Rivka Safer -[phone]   Best number [phone]

## 2018-01-10 NOTE — Telephone Encounter (Signed)
Copied from [place] [id number]. Topic: Quick Communication - Rx Refill/Question >> Jan 07, 2018  4:42 PM Bea Graff, NT wrote: Medication: ALPRAZolam Duanne Moron) 0.25 MG tablet, HYDROcodone-acetaminophen (NORCO/VICODIN) 5-325 MG tablet, and traMADol (ULTRAM) 50 MG tablet   Has the patient contacted their pharmacy? Yes.   (Agent: If no, request that the patient contact the pharmacy for the refill.) (Agent: If yes, when and what did the pharmacy advise?)  Preferred Pharmacy (with phone number or street name): [facility] [place], [place] [address] [place] [phone] (Phone) [phone] (Fax)    Pts son calling and states pharmacy did not receive these medications on 12/31/17 and that his dad is in a lot of pain.  Agent: Please be advised that RX refills may take up to 3 business days. We ask that you follow-up with your pharmacy. >> Jan 10, 2018  4:42 PM Cleaster Corin, NT wrote: Pt. [name] [place]) states that rx. Was not filled om may 13th and pt. Needs a refill on HYDROcodone-acetaminophen (NORCO/VICODIN) 5-325 MG tablet [[id number]]

## 2018-01-10 NOTE — Telephone Encounter (Signed)
Requesting refill request hydrocodone  LOV 12/31/2017 Dr Cordelia Pen   LAST FILLED 12/31/2017 90 TABS PHARMACY [place]  RX ON HOLD AS IT WOULD BE AN EARLY REFILL

## 2018-01-11 ENCOUNTER — Telehealth: Payer: Self-pay | Admitting: Internal Medicine

## 2018-01-11 ENCOUNTER — Encounter: Payer: Self-pay | Admitting: *Deleted

## 2018-01-11 ENCOUNTER — Telehealth: Payer: Self-pay | Admitting: Family Medicine

## 2018-01-11 DIAGNOSIS — R410 Disorientation, unspecified: Secondary | ICD-10-CM

## 2018-01-11 NOTE — Telephone Encounter (Signed)
No action needed. Was already taking care of!

## 2018-01-11 NOTE — Telephone Encounter (Signed)
Brian [name] returned phone call and stated that the pharmacy called about the benazepril and stated that I sent the wrong Rx he needs the Hydrocodone. I stated to Brian [name] that I just got off the phone with him and told him that this wasn't going to be filled until June. Then Brian [name] claimed that he has never given his dad this Rx and he has been just fine without it. Then the phone got disconnected. I tried to call back but the line is busy.

## 2018-01-11 NOTE — Telephone Encounter (Signed)
This encounter was created in error - please disregard.

## 2018-01-11 NOTE — Telephone Encounter (Signed)
Spoke to [place] the pt 's son and he stated that he has not picked up any refills but then admitted to picking up the Tramadol. Pt stated that he has not gotten his medications and will see if [name] was able to pick them up by viewing his credit card receipt.

## 2018-01-11 NOTE — Telephone Encounter (Signed)
Copied from [place] [id number]. Topic: Quick Communication - See Telephone Encounter >> Jan 11, 2018 11:01 AM Ether Griffins B wrote: CRM for notification. See Telephone encounter for: 01/11/18.  Pt calling in requesting something for pain that is not as strong as the hydrocodone. Please send to [facility] [id number] - [place], [place]

## 2018-01-11 NOTE — Telephone Encounter (Signed)
Dr.K Please advise

## 2018-01-11 NOTE — Telephone Encounter (Signed)
Spoke to Pt and informed him again about the pain medications. He stated that lance handles his medication and is usually great about giving him his medications. I asked if the pt has been receiving them he stated he doesn't know. The pt nurse was asked to get on the line. I asked her if she has given or seen this given to the pt. She stated that that she has only been working with him for 2 days now and hasn't worked with him in a while so she was unsure. Pt wanted me to call son and see what the issue was.   Spoke to Ettrick and he stated he didn't misplace the Rx he never got it filled. I informed lance that the pharmacist told him it was picked up Aprill 25th. [name] stated that they will wait until June to have it filled. Pt stated that he needs something now for pain. Not sure what to do.

## 2018-01-11 NOTE — Telephone Encounter (Signed)
Pt and his son have some concerns about taking benazepril. He states he has never taken this medication before and the pt's son is wondering if he should be taking both eliquis and benazepril.

## 2018-01-11 NOTE — Telephone Encounter (Signed)
Spoke to pharmacist and they are concerned about the pt son Brian [name] calling trying to refill all medications too soon. This was my concern as well. They are holding Hydrocodone until June 10th which will be th next filled day due to the issues. All other medications were picked up May 22nd. No further refills will be administered at this time.

## 2018-01-15 NOTE — Telephone Encounter (Signed)
Tramadol 50 mg   #60  1 every 8 hours as needed for pain

## 2018-01-15 NOTE — Telephone Encounter (Signed)
Notified sara to hold the medication. Clarise Cruz wanted to know if she could have the phentermine for 90 days instead of 30 because it's hard for her to find a want to pick it up and [name] has been overwhelmed with helping his dad out. Also can you please read the message that was left Shanon Brow control substances. Im wondering If social work referral is needed.

## 2018-01-15 NOTE — Telephone Encounter (Signed)
Okay 60 days phentermine  Okay for home health assistance and evaluation

## 2018-01-15 NOTE — Telephone Encounter (Signed)
Hold benazepril at this time.  Will reassess blood pressure at next visit

## 2018-01-17 NOTE — Telephone Encounter (Signed)
Tramadol cannot be sent in pharmacy has a block on it. Will call it in when appropriate.

## 2018-01-18 ENCOUNTER — Other Ambulatory Visit: Payer: Self-pay

## 2018-01-18 DIAGNOSIS — S4991XD Unspecified injury of right shoulder and upper arm, subsequent encounter: Secondary | ICD-10-CM | POA: Diagnosis not present

## 2018-01-18 DIAGNOSIS — I2510 Atherosclerotic heart disease of native coronary artery without angina pectoris: Secondary | ICD-10-CM | POA: Diagnosis not present

## 2018-01-18 DIAGNOSIS — I482 Chronic atrial fibrillation: Secondary | ICD-10-CM | POA: Diagnosis not present

## 2018-01-18 DIAGNOSIS — I69354 Hemiplegia and hemiparesis following cerebral infarction affecting left non-dominant side: Secondary | ICD-10-CM | POA: Diagnosis not present

## 2018-01-18 DIAGNOSIS — I10 Essential (primary) hypertension: Secondary | ICD-10-CM | POA: Diagnosis not present

## 2018-01-18 DIAGNOSIS — S8992XD Unspecified injury of left lower leg, subsequent encounter: Secondary | ICD-10-CM | POA: Diagnosis not present

## 2018-01-18 NOTE — Addendum Note (Signed)
Addended by: Gwenyth Ober R on: 01/18/2018 01:20 PM   Modules accepted: Orders

## 2018-01-21 DIAGNOSIS — S8992XD Unspecified injury of left lower leg, subsequent encounter: Secondary | ICD-10-CM | POA: Diagnosis not present

## 2018-01-21 DIAGNOSIS — I10 Essential (primary) hypertension: Secondary | ICD-10-CM | POA: Diagnosis not present

## 2018-01-21 DIAGNOSIS — S4991XD Unspecified injury of right shoulder and upper arm, subsequent encounter: Secondary | ICD-10-CM | POA: Diagnosis not present

## 2018-01-21 DIAGNOSIS — I69354 Hemiplegia and hemiparesis following cerebral infarction affecting left non-dominant side: Secondary | ICD-10-CM | POA: Diagnosis not present

## 2018-01-21 DIAGNOSIS — I2510 Atherosclerotic heart disease of native coronary artery without angina pectoris: Secondary | ICD-10-CM | POA: Diagnosis not present

## 2018-01-21 DIAGNOSIS — I482 Chronic atrial fibrillation: Secondary | ICD-10-CM | POA: Diagnosis not present

## 2018-01-22 ENCOUNTER — Encounter: Payer: Self-pay | Admitting: Neurology

## 2018-01-22 ENCOUNTER — Ambulatory Visit (INDEPENDENT_AMBULATORY_CARE_PROVIDER_SITE_OTHER): Payer: Medicare Other | Admitting: Neurology

## 2018-01-22 VITALS — BP 112/71 | HR 110 | Ht 66.0 in

## 2018-01-22 DIAGNOSIS — G8110 Spastic hemiplegia affecting unspecified side: Secondary | ICD-10-CM

## 2018-01-22 DIAGNOSIS — I6300 Cerebral infarction due to thrombosis of unspecified precerebral artery: Secondary | ICD-10-CM | POA: Diagnosis not present

## 2018-01-22 NOTE — Progress Notes (Signed)
[facility] [address] [place]. [place] [address] [phone]       OFFICE FOLLOW-UP NOTE  [name] Date of Birth:  [birth year] Medical Record Number:  [id number]   HPI: Initial visit 07/25/16 ;Mr Brian [name] is a pleasant elderly Caucasian male seen today for the first office follow-up visit following hospital admission for stroke in September 2017. He is accompanied by his daughter and caregiver. Jaun Brian [name] an 84 y.o.malewith past medical history of TIA, hypertension, atrial flutter. Patient was brought to the emergency department as code stroke after he was found down in the kitchen with left-sided hemiparesis. Patient's last known normal was 6:15 PM the prior night. Per son as 6:15 this morning patient called out to him and he was found on the floor not moving his left side. Patient was brought in for immediate CT scan which showed a right hyperdense MCA. At that time patient was considered to be a possible wake up stroke. Patient was brought for immediate CT angiogram and perfusion. CT perfusion showed significant amount of viable tissue. For this reason he was brought to intervention radiology. 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 Cineangiogram showed thrombus in the right carotid terminus. Patient was kept in the intensive care and and blood pressure was tightly controlled. He was extubated and did well. MRI scan and subsequent to confirm an acute infarct involving right caudate head and slight petechial hemorrhage in the right basal ganglia. Transthoracic echo showed normal ejection fraction. Hemoglobin A1c was 5.3. LDL  cholesterol 62 mg percent. He was seen by physical ,occupational  And speech therapy. He had persistent atrial fibrillation and his INR was optimal 2.8 on admission hence he was switched to eliquis for anticoagulation. Patient was discharged home with home physical and occupation therapy. He is at home and still getting therapy. His able to walk with a walker himself but yet his balance remains poor. The patient is not able to sleep well at night mostly because of his bladder urgency and frequency of urination. Patient has a 24-hour caregiver at home. He feels physically his left-sided strength has improved but his balance is poor. He plans to dental cleanings soon. His starting eliquis well without bleeding or bruising. Patient and daughter are interested in outpatient therapy as well. Update 01/23/2017: He returns for follow-up after last visit 6 months ago. He is accompanied by his caregiver. Patient is living alone but his son spends the night with him. He does have a CNA for 8 hours during the day every day. He states his left knee pain and arthritis is limiting his ability to walk. He walks very little. He did see orthopedic doctor who stated that he was not a candidate for surgery. He was prescribed pain medication but patient chose not to take them. His remains on eliquis which is tolerating well without bleeding or bruising. He states his blood pressure is well controlled and today it is 121/70. Is tolerating his Pravachol well without muscle aches or pains in his last lipid profile checked for satisfactory. He has no new neurological complaints. Update 01/22/2018 ; he he returns for follow-up after last visit a year ago.  He is accompanied by his son  today.  Patient continues to do well without recurrent strokes or TIAs.  He still has residual left-sided weakness and is able to walk with a walker but uses a wheelchair for long distances.  He states he is more bothered by pain in his left shoulder and knee  from arthritis.  He has not yet discussed this with his primary physician.  He recently required a new hospital bed and a special mattress to help with his back pain.  He remains on Eliquis which is tolerating well without bleeding or bruising.  His blood pressure is well controlled and today it is 112/71.  He remains on Pravachol which is tolerating well without muscle aches and pains but is unable to tell me when his last lipid profile was checked.  Review of electronic medical record shows LDL cholesterol of 98 mg percent on 10/03/2016 he has not had any follow-up carotid ultrasounds done for several years. ROS:   14 system review of systems is positive for    walking difficulty,  back pain, joint pain and swelling, f  and all other systems negative  PMH:  Past Medical History:  Diagnosis Date  . Anemia   . Atrial flutter ([place])   . BPH (benign prostatic hypertrophy)   . Epistaxis   . Hemorrhage of gastrointestinal tract, unspecified   . History of open heart surgery   . Hypertension   . Osteoarthrosis, unspecified whether generalized or localized, unspecified site   . Personal history of venous thrombosis and embolism   . Rosacea   . Stroke ([place])   . TIA (transient ischemic attack)     Social History:  Social History   Socioeconomic History  . Marital status: Married    Spouse name: Not on file  . Number of children: Not on file  . Years of education: Not on file  . Highest education level: Not on file  Occupational History  . Not on file  Social Needs  . Financial resource strain: Not on file  . Food insecurity:    Worry: Not on file    Inability: Not on file  . Transportation needs:    Medical: Not on file    Non-medical: Not on file  Tobacco Use  . Smoking status: Former Research scientist (life sciences)  . Smokeless tobacco: Never Used  . Tobacco comment: quit 50 yr ago  Substance and Sexual Activity  . Alcohol use: No  . Drug use: No  . Sexual activity: Not on file  Lifestyle  . Physical  activity:    Days per week: Not on file    Minutes per session: Not on file  . Stress: Not on file  Relationships  . Social connections:    Talks on phone: Not on file    Gets together: Not on file    Attends religious service: Not on file    Active member of club or organization: Not on file    Attends meetings of clubs or organizations: Not on file    Relationship status: Not on file  . Intimate partner violence:    Fear of current or ex partner: Not on file    Emotionally abused: Not on file    Physically abused: Not on file    Forced sexual activity: Not on file  Other Topics Concern  . Not on file  Social History Narrative  . Not on file    Medications:   Current Outpatient Medications on File Prior to Visit  Medication Sig Dispense Refill  .  ALPRAZolam (XANAX) 0.25 MG tablet Take 1 tablet (0.25 mg total) by mouth at bedtime. 90 tablet 0  . ANUCORT-HC 25 MG suppository UNWRAP AND INSERT 1 SUPPOSITORY RECTALLY TWICE DAILY 12 suppository 1  . apixaban (ELIQUIS) 2.5 MG TABS tablet Take 1 tablet (2.5 mg total) by mouth 2 (two) times daily. 60 tablet 6  . benazepril (LOTENSIN) 5 MG tablet Take 1 tablet (5 mg total) by mouth daily. 90 tablet 3  . Catheters (GIZMO CONDOM CATHETER) MISC USE AS NEEDED 100 each 0  . finasteride (PROSCAR) 5 MG tablet Take 1 tablet (5 mg total) by mouth daily. 90 tablet 3  . HYDROcodone-acetaminophen (NORCO/VICODIN) 5-325 MG tablet 1 tablet every 8-12 hours as needed for pain not to exceed 2 tablets in a 24-hour. 90 tablet 0  . hydrocortisone (ANUSOL-HC) 2.5 % rectal cream Place 1 application rectally 2 (two) times daily. 30 g 0  . ipratropium (ATROVENT) 0.06 % nasal spray USE 2 SPRAYS IN EACH NOSTRIL FOUR TIMES DAILY 15 mL 0  . loratadine (CLARITIN) 10 MG tablet Take 10 mg by mouth daily.  1  . metoprolol succinate (TOPROL-XL) 25 MG 24 hr tablet TAKE 1 TABLET BY MOUTH EVERY MORNING AND TAKE 2 TABLETS BY MOUTH EVERY EVENING 270 tablet 3  . pantoprazole  (PROTONIX) 40 MG tablet Take 1 tablet (40 mg total) daily by mouth. 90 tablet 3  . pravastatin (PRAVACHOL) 20 MG tablet Take 1 tablet (20 mg total) by mouth daily. 90 tablet 3  . PROCTOZONE-HC 2.5 % rectal cream APPLY 1 APPLICATORFUL EXTERNALLY TO THE AFFECTED AREA RECTALLY TWICE DAILY 30 g 0  . sertraline (ZOLOFT) 25 MG tablet Take 1 tablet (25 mg total) by mouth daily. 90 tablet 5  . traMADol (ULTRAM) 50 MG tablet Take 1 tablet (50 mg total) by mouth every 8 (eight) hours as needed. 90 tablet 0   No current facility-administered medications on file prior to visit.     Allergies:   Allergies  Allergen Reactions  . Novocain [Procaine Hcl]     Irregular heart beat     . Penicillins Itching    Has patient had a PCN reaction causing immediate rash, facial/tongue/throat swelling, SOB or lightheadedness with hypotension: Unk Has patient had a PCN reaction causing severe rash involving mucus membranes or skin necrosis: Unk Has patient had a PCN reaction that required hospitalization: Unk Has patient had a PCN reaction occurring within the last 10 years: No If all of the above answers are "NO", then may proceed with Cephalosporin use.     Physical Exam General: Frail cachectic elderly Caucasian male, seated, in no evident distress Head: head normocephalic and atraumatic.  Neck: supple with no carotid or supraclavicular bruits Cardiovascular: irregular rate and rhythm, no murmurs Musculoskeletal: no deformity Except left knee swollen and painful with restriction of range of motion Skin:  no rash/petichiae Vascular:  Normal pulses all extremities Vitals:   01/22/18 1302  BP: 112/71  Pulse: (!) 110   Neurologic Exam Mental Status: Awake and fully alert. Oriented to place and time. Recent and remote memory intact. Attention span, concentration and fund of knowledge appropriate. Mood and affect appropriate.  Cranial Nerves: Fundoscopic exam not done Pupils equal, briskly reactive to  light. Extraocular movements full without nystagmus. Visual fields full to confrontation. Hearing intact. Facial sensation intact. Mild left facial asymmetry on smiling only. Tongue, palate moves normally and symmetrically.  Motor: Mild left hemiparesis 4/5 strength with weakness of left grip and intrinsic hand muscles.  Orbits right over left upper extremity. Mild weakness of left hip flexors and ankle dorsiflexors. No upper or lower extremity drift. Sensory.: intact to touch ,pinprick .position and vibratory sensation.  Coordination: Rapid alternating movements normal in all extremities. Finger-to-nose and heel-to-shin performed accurately bilaterally. Gait and Station: Deferred as patient did not bring his walker. Reflexes: 1+ and symmetric. Toes downgoing.       ASSESSMENT: 50 year Caucasian male with embolic right basal ganglia infarct in September 2017 secondary to atrial fibrillation due to right middle cerebral artery occlusion treated with mechanical embolectomy with complete recanalization with excellent clinical outcome. He still has mild left hemiparesis and gait and balance difficulties.  Patient is neurologically stable    PLAN: II had a long d/w patient and his son about his remote stroke,spastic left hemiplegia risk for recurrent stroke/TIAs, personally independently reviewed imaging studies and stroke evaluation results and answered questions.Continue Eliquis (apixaban) daily  for secondary stroke prevention and maintain strict control of hypertension with blood pressure goal below 130/90, diabetes with hemoglobin A1c goal below 6.5% and lipids with LDL cholesterol goal below 70 mg/dL. I also advised the patient to eat a healthy diet with plenty of whole grains, cereals, fruits and vegetables, exercise regularly and maintain ideal body weight . Check follow up carotid and transcranial doppler studies.Followup in the future with me in 1 year or call earlier if necessary.Greater than  50% of time during this 25 minute visit was spent on counseling,explanation of diagnosis, planning of further management, discussion with patient and family and coordination of care Antony Contras, MD  [facility] [address] [place], [place] [address]  Phone [phone] Fax [phone] Note: This document was prepared with digital dictation and possible smart phrase technology. Any transcriptional errors that result from this process are unintentional

## 2018-01-22 NOTE — Patient Instructions (Signed)
I had a long d/w patient and his son about his remote stroke,spastic left hemiplegia risk for recurrent stroke/TIAs, personally independently reviewed imaging studies and stroke evaluation results and answered questions.Continue Eliquis (apixaban) daily  for secondary stroke prevention and maintain strict control of hypertension with blood pressure goal below 130/90, diabetes with hemoglobin A1c goal below 6.5% and lipids with LDL cholesterol goal below 70 mg/dL. I also advised the patient to eat a healthy diet with plenty of whole grains, cereals, fruits and vegetables, exercise regularly and maintain ideal body weight . Check follow up carotid and transcranial doppler studies.Followup in the future with me in 1 year or call earlier if necessary

## 2018-01-23 ENCOUNTER — Telehealth: Payer: Self-pay | Admitting: Cardiovascular Disease

## 2018-01-23 ENCOUNTER — Other Ambulatory Visit: Payer: Self-pay | Admitting: Internal Medicine

## 2018-01-23 NOTE — Telephone Encounter (Signed)
New Message:    Pt would like to know  his last Cholesterol results please.

## 2018-01-23 NOTE — Telephone Encounter (Signed)
Returned call to patient, aware of lab lipid panel date and he has results printed from [facility].  No further questions.

## 2018-01-24 ENCOUNTER — Other Ambulatory Visit: Payer: Self-pay

## 2018-01-24 DIAGNOSIS — I10 Essential (primary) hypertension: Secondary | ICD-10-CM | POA: Diagnosis not present

## 2018-01-24 DIAGNOSIS — I2510 Atherosclerotic heart disease of native coronary artery without angina pectoris: Secondary | ICD-10-CM | POA: Diagnosis not present

## 2018-01-24 DIAGNOSIS — S4991XD Unspecified injury of right shoulder and upper arm, subsequent encounter: Secondary | ICD-10-CM | POA: Diagnosis not present

## 2018-01-24 DIAGNOSIS — I69354 Hemiplegia and hemiparesis following cerebral infarction affecting left non-dominant side: Secondary | ICD-10-CM | POA: Diagnosis not present

## 2018-01-24 DIAGNOSIS — I482 Chronic atrial fibrillation: Secondary | ICD-10-CM | POA: Diagnosis not present

## 2018-01-24 DIAGNOSIS — S8992XD Unspecified injury of left lower leg, subsequent encounter: Secondary | ICD-10-CM | POA: Diagnosis not present

## 2018-01-24 NOTE — Addendum Note (Signed)
Addended by: Gwenyth Ober R on: 01/24/2018 09:46 AM   Modules accepted: Orders

## 2018-01-24 NOTE — Patient Outreach (Signed)
[place] [facility]) Care Management  01/24/2018  DALLYN BERGLAND 1933/08/02 [id number]   Referral Date: 01/24/18 Referral Source: MD referral Referral Reason: unknown   Outreach Attempt#1 spoke with patient. He is able to verify HIPAA.  Patient unsure of reason for referral but spoke of possible physical therapy.  Patient asked for CM contact information to call back.  He states he will try to get some information.    Telephone call to Nonah Mattes at PCP office to find out reason for the referral.  Message left for return call.     Plan: RN CM will wait return call from PCP office.     Jone Baseman, RN, MSN [facility] Care Management Care Management Coordinator Direct Line [phone] Toll Free: [phone]  Fax: [phone]

## 2018-01-25 ENCOUNTER — Other Ambulatory Visit: Payer: Self-pay | Admitting: Internal Medicine

## 2018-01-25 ENCOUNTER — Other Ambulatory Visit: Payer: Self-pay

## 2018-01-25 NOTE — Telephone Encounter (Signed)
Copied from [place] [id number]. Topic: Quick Communication - Rx Refill/Question >> Jan 25, 2018 12:07 PM Oliver Pila B wrote: Medication: doxycycline (VIBRA-TABS) 100 MG tablet [[id number]] DISCONTINUED , HYDROcodone-acetaminophen (NORCO/VICODIN) 5-325 MG tablet [[id number]] , traMADol (ULTRAM) 50 MG tablet [[id number]]   Has the patient contacted their pharmacy? Yes.   (Agent: If no, request that the patient contact the pharmacy for the refill.) (Agent: If yes, when and what did the pharmacy advise?)  Preferred Pharmacy (with phone number [facility] street [facility]  Agent: Please be advised that RX refills may take up to 3 business days. We ask that you follow-up with your pharmacy.

## 2018-01-25 NOTE — Telephone Encounter (Signed)
Copied from [place] [id number]. Topic: General - Other >> Jan 24, 2018  2:16 PM Oneta Rack wrote: Caller name: Dionna  Relation to pt: [facility]  Call back number: [phone]    Reason for call:  [facility] requesting clarity regarding why referral was placed, please advise   Spoke to Dionna in hopes to provide community resources and medication management for pt. I called pt and informed them of [facility] and state that they already spoke with [facility]. Not sure after talking with the pt if they want them or not. Also [name] (son) wanted to know if I could send in Doxycycline and tramadol. [name] was informed that I will talk to [facility] about the refill.

## 2018-01-25 NOTE — Patient Outreach (Signed)
[facility]) Care Management  01/25/2018  JAHSON EMANUELE [birth year] [id number]   Telephone call to Dr. Truddie Hidden office for referral clarification.  Message left for nurse.  Plan: RN CM will wait for return call from MD office.  Jone Baseman, RN, MSN [facility] Care Management Care Management Coordinator Direct Line [phone] Toll Free: [phone]  Fax: [phone]

## 2018-01-25 NOTE — Patient Outreach (Signed)
[facility]) Care Management  01/25/2018  Brian [name] September 20, 1932 [id number]   Incoming call from Tillie Rung at Dr. Burnice Logan for clarification on referral.  She reports that patient has care needs and medication administration needs.  She states it is best a to call son Brian [name]  Advised that CM would call.  She verbalized understanding.  Telephone call to son Brian [name] for referral.  He states he is not sure of the reason for the referral but they have care and that patient medications are administered by him.  He does state that patient needs medications.  Advised him call physician office for refills.  He thanks CM for referral.   Patient got on the phone and states that he has comfort care to help with his care and states there is no problems with medications.  Offered [facility] of possibly a nurse but he declined and hung up the phone.    11:41 am telephone call to physician office to update on status of referral.  Message left for Tillie Rung to notify that services were declined.    Plan: RN CM will close case at this time.  Jone Baseman, RN, MSN [facility] Care Management Care Management Coordinator Direct Line [phone] Toll Free: [phone]  Fax: [phone]

## 2018-01-25 NOTE — Telephone Encounter (Signed)
LOV  12/31/17 Dr. Burnice Logan Last refill 12/31/17  # 90  0 refill

## 2018-01-28 ENCOUNTER — Other Ambulatory Visit: Payer: Self-pay | Admitting: Internal Medicine

## 2018-01-28 ENCOUNTER — Telehealth: Payer: Self-pay

## 2018-01-28 NOTE — Telephone Encounter (Signed)
Spoke to [name] and she verbalized understanding. No apt. is set for the future. Brian [name] was told that apt need to be scheduled she stated Brian [name] will call back shortly to make an apt.

## 2018-01-28 NOTE — Telephone Encounter (Signed)
  Copied from [place] [phone]. Topic: General - Other >> Jan 28, 2018 12:53 PM Carolyn Stare wrote:  Pt son said they did not pick up the  HYDROcodone-acetaminophen (NORCO/VICODIN) 5-325 MG tablet in May and is asking for a refill  Pt is also following up on request for TRAMADOL RX showing rx was printed but not sure it iwas sent    __________________________________________________________________________________________  Per Sydell Axon @ [facility] pt has filled the following rx's:  Tramadol 01/25/18 #90 01/02/18 #90 12/12/17 #90  Hydrocodone 01/28/18 #90 01/11/18 #90 01/28/18 #90  The Hydrocodone WAS picked up in May and also again today. Also Tramadol was picked up last week.  Not sure what is going on with his meds...  Dr. Raliegh Ip - PLEASE REVIEW. Thanks!

## 2018-01-28 NOTE — Telephone Encounter (Signed)
Notify patient/family that doxycycline is not necessary and will discuss at next office visit

## 2018-01-28 NOTE — Telephone Encounter (Signed)
[facility] has been consulted to assist with medications and these issues

## 2018-01-29 ENCOUNTER — Encounter: Payer: Self-pay | Admitting: *Deleted

## 2018-01-29 ENCOUNTER — Telehealth: Payer: Self-pay | Admitting: Cardiovascular Disease

## 2018-01-29 DIAGNOSIS — I69354 Hemiplegia and hemiparesis following cerebral infarction affecting left non-dominant side: Secondary | ICD-10-CM | POA: Diagnosis not present

## 2018-01-29 DIAGNOSIS — S4991XD Unspecified injury of right shoulder and upper arm, subsequent encounter: Secondary | ICD-10-CM | POA: Diagnosis not present

## 2018-01-29 DIAGNOSIS — I2510 Atherosclerotic heart disease of native coronary artery without angina pectoris: Secondary | ICD-10-CM | POA: Diagnosis not present

## 2018-01-29 DIAGNOSIS — S8992XD Unspecified injury of left lower leg, subsequent encounter: Secondary | ICD-10-CM | POA: Diagnosis not present

## 2018-01-29 DIAGNOSIS — I10 Essential (primary) hypertension: Secondary | ICD-10-CM | POA: Diagnosis not present

## 2018-01-29 DIAGNOSIS — I482 Chronic atrial fibrillation: Secondary | ICD-10-CM | POA: Diagnosis not present

## 2018-01-29 NOTE — Telephone Encounter (Signed)
   [facility] Pre-operative Risk Assessment    Request for surgical clearance:  1. What type of surgery is being performed? Selected nerve root block   2. When is this surgery scheduled? TBD   3. What type of clearance is required (medical clearance vs. Pharmacy clearance to hold med vs. Both)? Pharmacy   4. Are there any medications that need to be held prior to surgery and how long? Will NOT have to stop Eliquis   5. Practice name and name of physician performing surgery? Dr. Nelva Bush   6. What is your office phone number: unknown - message came in from patient     7.   What is your office fax number: unknown - message came in from patient  8.   Anesthesia type (None, local, MAC, general) ? None    Sheral Apley M 01/29/2018, 4:46 PM  _________________________________________________________________   (provider comments below)

## 2018-01-29 NOTE — Telephone Encounter (Signed)
New Message:    Pt wants to know if Dr Oval Linsey approves of him having a Selected Nerve Root Block. Pt will not have to stop his Eliquis per Dr Nelva Bush.

## 2018-01-29 NOTE — Telephone Encounter (Signed)
Copied from [place] [phone]. Topic: Quick Communication - See Telephone Encounter >> Jan 25, 2018 11:46 AM Antonieta Iba C wrote: CRM for notification. See Telephone encounter for: 01/25/18.  Deon with [place] network called in to make [facility] aware that pt and his son declined services.

## 2018-01-29 NOTE — Telephone Encounter (Signed)
Copied from [place] [phone]. Topic: Quick Communication - See Telephone Encounter >> Jan 25, 2018 11:46 AM Antonieta Iba C wrote: CRM for notification. See Telephone encounter for: 01/25/18.  Deon with [place] network called in to make [facility] aware that pt and his son declined services.   This encounter was created in error - please disregard.

## 2018-01-30 ENCOUNTER — Telehealth: Payer: Self-pay | Admitting: Internal Medicine

## 2018-01-30 ENCOUNTER — Telehealth: Payer: Self-pay

## 2018-01-30 DIAGNOSIS — L719 Rosacea, unspecified: Secondary | ICD-10-CM

## 2018-01-30 NOTE — Telephone Encounter (Signed)
Pt is getting a shot for pain. Pt will stop Eliquis for tomorrow and resume 2 days post.

## 2018-01-30 NOTE — Telephone Encounter (Signed)
Okay for pt to continue with shot? Please advise

## 2018-01-30 NOTE — Telephone Encounter (Signed)
Unclear about clearance. Will have nursing staff call Dr. Nelva Bush' office to see what clearance is needed and to verify that they do not need Eliquis to be held for procedure.

## 2018-01-30 NOTE — Telephone Encounter (Signed)
Copied from [place] [id number]. Topic: Referral - Request >> Jan 30, 2018 11:19 AM Neva Seat wrote: Pt requesting a referral to a Dermatologist Dr. Raliegh Ip recommends.

## 2018-01-30 NOTE — Telephone Encounter (Signed)
Spoke with Estill Bamberg from [facility] and she assured me that pt didn't need a cardiac / medication clearance, that the pt was good to go.

## 2018-01-30 NOTE — Telephone Encounter (Signed)
Confirmed with Dr. Nelva Bush' office. They do not need medical clearance, just recs on Eliquis. This is management by his PCP and will need clearance from Marletta Lor, MD   Will remove from preop pool.

## 2018-01-30 NOTE — Telephone Encounter (Signed)
Suggest to the patient hold Eliquis 2 days prior to the procedure, and resume 2 days following the procedure

## 2018-01-30 NOTE — Telephone Encounter (Unsigned)
Copied from [place] [id number]. Topic: Quick Communication - See Telephone Encounter >> Jan 30, 2018 11:20 AM Neva Seat wrote: Pt is going to a orthopedic doctor (Dr Nelva Bush [facility]) on Thurs to get a shot in the back. Pt was called by office tellingihim the shot could not be administered due to the blood thinner he is currently taking.  He then received a call back saying it will be ok to take shot. Pt wants to verify w/ Dr. Raliegh Ip if he thinks this will be ok to go ahead w/ his appt and shot on Thurs, 6-13. Please call pt back TODAY to let him know.

## 2018-02-01 ENCOUNTER — Telehealth: Payer: Self-pay | Admitting: Cardiovascular Disease

## 2018-02-01 DIAGNOSIS — I10 Essential (primary) hypertension: Secondary | ICD-10-CM | POA: Diagnosis not present

## 2018-02-01 DIAGNOSIS — L89159 Pressure ulcer of sacral region, unspecified stage: Secondary | ICD-10-CM | POA: Diagnosis not present

## 2018-02-01 DIAGNOSIS — I4891 Unspecified atrial fibrillation: Secondary | ICD-10-CM | POA: Diagnosis not present

## 2018-02-01 DIAGNOSIS — F419 Anxiety disorder, unspecified: Secondary | ICD-10-CM | POA: Diagnosis not present

## 2018-02-01 NOTE — Telephone Encounter (Signed)
New Message:     Daughter called earlier this week.She just wanted to know if Dr Oval Linsey approved of pt have Selected Nerve Injection,not going into the spine just around the nerve.He did not clearance for this,just wanted Dr Oval Linsey approval or opinion about it for him.

## 2018-02-01 NOTE — Telephone Encounter (Signed)
Returned call to patient's daughter [name] no answer.Left message on personal voice mail Dr. out of office.I will send message to her.

## 2018-02-04 NOTE — Telephone Encounter (Signed)
Okay for referral? Please advise for the other issue as well

## 2018-02-04 NOTE — Telephone Encounter (Signed)
Okay for referral?

## 2018-02-04 NOTE — Telephone Encounter (Signed)
Pt is f/u on referral. Sore on both legs and buttock Please call on work ph.

## 2018-02-05 ENCOUNTER — Other Ambulatory Visit: Payer: Self-pay | Admitting: Internal Medicine

## 2018-02-05 ENCOUNTER — Ambulatory Visit (HOSPITAL_BASED_OUTPATIENT_CLINIC_OR_DEPARTMENT_OTHER)
Admission: RE | Admit: 2018-02-05 | Discharge: 2018-02-05 | Disposition: A | Discharge status: Home / Self Care | Payer: Medicare Other | Source: Ambulatory Visit | Attending: Neurology | Admitting: Neurology

## 2018-02-05 ENCOUNTER — Telehealth: Payer: Self-pay | Admitting: Internal Medicine

## 2018-02-05 ENCOUNTER — Ambulatory Visit (HOSPITAL_COMMUNITY)
Admission: RE | Admit: 2018-02-05 | Discharge: 2018-02-05 | Disposition: A | Discharge status: Home / Self Care | Payer: Medicare Other | Source: Ambulatory Visit | Attending: Neurology | Admitting: Neurology

## 2018-02-05 DIAGNOSIS — G8110 Spastic hemiplegia affecting unspecified side: Secondary | ICD-10-CM | POA: Diagnosis not present

## 2018-02-05 DIAGNOSIS — I6529 Occlusion and stenosis of unspecified carotid artery: Secondary | ICD-10-CM | POA: Diagnosis not present

## 2018-02-05 NOTE — Telephone Encounter (Signed)
Copied from [place] [phone]. Topic: Inquiry >> Feb 05, 2018  5:09 PM Cecelia Byars, NT wrote: Reason for CRM: Patients [name] called and said his Dad needs a refill on doxycycline due to having polyps in his nose ,he says he has infection in his nose please call him at [phone]

## 2018-02-05 NOTE — Telephone Encounter (Signed)
Patient's son called and said he is going to have the shot in September & wants to know is it ok to have it? He gave the phone to his son and he said he was going to have it today, patient seemed confused. Can someone call him? [phone]

## 2018-02-05 NOTE — Telephone Encounter (Signed)
Copied from [place] [phone]. Topic: Quick Communication - See Telephone Encounter >> Feb 05, 2018  9:48 AM Ahmed Prima L wrote: CRM for notification. See Telephone encounter for: 02/05/18.  Patient is requesting doxycycline (VIBRA-TABS) 100 MG tablet [[id number]]  DISCONTINUED from the polis in his nose. He said that Dr Raliegh Ip discontinued this and he doesn't understand why. Please advise.    [facility] [place] [address] [place]

## 2018-02-05 NOTE — Progress Notes (Signed)
Carotid duplex prelim: 1-39% ICA stenosis. TCD completed.  Landry Mellow, RDMS, RVT

## 2018-02-06 ENCOUNTER — Other Ambulatory Visit (HOSPITAL_COMMUNITY): Payer: Medicare Other

## 2018-02-06 ENCOUNTER — Encounter (HOSPITAL_COMMUNITY): Payer: Medicare Other

## 2018-02-06 NOTE — Telephone Encounter (Signed)
Blood thinner not being held

## 2018-02-06 NOTE — Telephone Encounter (Signed)
OK to have injection.  Are they going to hold his blood thinner? Given his embolic stroke history this would be risky.

## 2018-02-06 NOTE — Telephone Encounter (Signed)
Pt was already told that this Rx was denied per Dr.Kwiatkowski 3 weeks ago. Pt wife Clarise Cruz was given the same information today. No further action needed.

## 2018-02-06 NOTE — Telephone Encounter (Signed)
Spoke to [place] and she stated that the pt and [name] are not home at the moment. I informed her of the referral placed for dermatology and letting her know that the doxycycline has been denied per Dr,kwiatkowski.

## 2018-02-06 NOTE — Telephone Encounter (Signed)
Brian [name], ok per [facility]

## 2018-02-07 ENCOUNTER — Telehealth: Payer: Self-pay

## 2018-02-07 DIAGNOSIS — S4991XD Unspecified injury of right shoulder and upper arm, subsequent encounter: Secondary | ICD-10-CM | POA: Diagnosis not present

## 2018-02-07 DIAGNOSIS — S8992XD Unspecified injury of left lower leg, subsequent encounter: Secondary | ICD-10-CM | POA: Diagnosis not present

## 2018-02-07 DIAGNOSIS — I482 Chronic atrial fibrillation: Secondary | ICD-10-CM | POA: Diagnosis not present

## 2018-02-07 DIAGNOSIS — I69354 Hemiplegia and hemiparesis following cerebral infarction affecting left non-dominant side: Secondary | ICD-10-CM | POA: Diagnosis not present

## 2018-02-07 DIAGNOSIS — I10 Essential (primary) hypertension: Secondary | ICD-10-CM | POA: Diagnosis not present

## 2018-02-07 DIAGNOSIS — I2510 Atherosclerotic heart disease of native coronary artery without angina pectoris: Secondary | ICD-10-CM | POA: Diagnosis not present

## 2018-02-07 NOTE — Telephone Encounter (Signed)
This has already been addressed as the requesting office did not need clearance. Will resend this information and remove from preop pool.

## 2018-02-07 NOTE — Telephone Encounter (Signed)
Notes recorded by Marval Regal, RN on 02/07/2018 at 1:09 PM EDT Left vm on lance dpr son cell number to cal back about vas tcd doppler results. ------

## 2018-02-07 NOTE — Telephone Encounter (Signed)
-----   Message from Garvin Fila, MD sent at 02/06/2018  2:39 PM EDT ----- Mitchell Heir inform the patient that carotid ultrasound study was unremarkable

## 2018-02-07 NOTE — Telephone Encounter (Signed)
Notes recorded by Marval Regal, RN on 02/07/2018 at 1:09 PM EDT Left vm on lance son dpr cell number to call back about pts results. ------

## 2018-02-13 NOTE — Telephone Encounter (Signed)
Left vm for pts son on dpr [name] to call back about vas results.

## 2018-02-13 NOTE — Telephone Encounter (Signed)
Notes recorded by Marval Regal, RN on 02/13/2018 at 11:23 AM EDT Left vm for son to call back about TCD doppler results. ------

## 2018-02-14 DIAGNOSIS — M5136 Other intervertebral disc degeneration, lumbar region: Secondary | ICD-10-CM | POA: Diagnosis not present

## 2018-02-14 DIAGNOSIS — M5416 Radiculopathy, lumbar region: Secondary | ICD-10-CM | POA: Diagnosis not present

## 2018-02-14 DIAGNOSIS — G8929 Other chronic pain: Secondary | ICD-10-CM | POA: Insufficient documentation

## 2018-02-14 NOTE — Telephone Encounter (Signed)
RN call patient to give results. RN stated the patient that transcranial Doppler study shows low velocities in the right middle cerebral artery which suggests mild narrowing and normal velocities in remaining blood vessels. Continue present medical therapy and no need to worry unless he is having new stroke symptoms. PT verbalized understanding. ------

## 2018-02-14 NOTE — Telephone Encounter (Signed)
RN call patient about vas carotid ultrasound unremarkable. Pt verbalized understanding. ------

## 2018-02-15 DIAGNOSIS — S4991XD Unspecified injury of right shoulder and upper arm, subsequent encounter: Secondary | ICD-10-CM | POA: Diagnosis not present

## 2018-02-15 DIAGNOSIS — I69354 Hemiplegia and hemiparesis following cerebral infarction affecting left non-dominant side: Secondary | ICD-10-CM | POA: Diagnosis not present

## 2018-02-15 DIAGNOSIS — I482 Chronic atrial fibrillation: Secondary | ICD-10-CM | POA: Diagnosis not present

## 2018-02-15 DIAGNOSIS — S8992XD Unspecified injury of left lower leg, subsequent encounter: Secondary | ICD-10-CM | POA: Diagnosis not present

## 2018-02-15 DIAGNOSIS — I10 Essential (primary) hypertension: Secondary | ICD-10-CM | POA: Diagnosis not present

## 2018-02-15 DIAGNOSIS — I2510 Atherosclerotic heart disease of native coronary artery without angina pectoris: Secondary | ICD-10-CM | POA: Diagnosis not present

## 2018-02-18 ENCOUNTER — Telehealth: Payer: Self-pay

## 2018-02-18 ENCOUNTER — Telehealth: Payer: Self-pay | Admitting: Cardiovascular Disease

## 2018-02-18 DIAGNOSIS — E7800 Pure hypercholesterolemia, unspecified: Secondary | ICD-10-CM

## 2018-02-18 DIAGNOSIS — I10 Essential (primary) hypertension: Secondary | ICD-10-CM

## 2018-02-18 DIAGNOSIS — Z5181 Encounter for therapeutic drug level monitoring: Secondary | ICD-10-CM

## 2018-02-18 NOTE — Telephone Encounter (Signed)
Patient called in requesting that Dr. Oval Linsey review the results of his carotid and transcranial doppler. He would like to have her thoughts on the results and if she feels like there is anything he can do. He has been informed that Dr. Oval Linsey is currently out this week.

## 2018-02-18 NOTE — Telephone Encounter (Signed)
New message:      Pt is calling and states he would like for [facility] to look at his previous test that was ordered by Dr. Leonie Man. Pt states they called him and went over them but he is not fully understanding the results

## 2018-02-18 NOTE — Telephone Encounter (Signed)
Copied from [place] [phone]. Topic: Inquiry >> Feb 18, 2018  2:10 PM Pricilla Handler wrote: Reason for CRM: Patient called stating that he wants Dr. Raliegh Ip to review the notes from his Neurologist. Patient wants a second opinion from Dr. Raliegh Ip regarding the other doctor's notes.       Thank You!!!   Dr.K - I do not see any Neuro notes in pt's chart or a referral to Neuro. Please advise. Thanks!

## 2018-02-18 NOTE — Telephone Encounter (Signed)
Will review and discussed with patient at his next office visit

## 2018-02-19 DIAGNOSIS — I482 Chronic atrial fibrillation: Secondary | ICD-10-CM | POA: Diagnosis not present

## 2018-02-19 DIAGNOSIS — S8992XD Unspecified injury of left lower leg, subsequent encounter: Secondary | ICD-10-CM | POA: Diagnosis not present

## 2018-02-19 DIAGNOSIS — S4991XD Unspecified injury of right shoulder and upper arm, subsequent encounter: Secondary | ICD-10-CM | POA: Diagnosis not present

## 2018-02-19 DIAGNOSIS — I10 Essential (primary) hypertension: Secondary | ICD-10-CM | POA: Diagnosis not present

## 2018-02-19 DIAGNOSIS — I2510 Atherosclerotic heart disease of native coronary artery without angina pectoris: Secondary | ICD-10-CM | POA: Diagnosis not present

## 2018-02-19 DIAGNOSIS — I69354 Hemiplegia and hemiparesis following cerebral infarction affecting left non-dominant side: Secondary | ICD-10-CM | POA: Diagnosis not present

## 2018-02-19 NOTE — Telephone Encounter (Signed)
Noted  

## 2018-02-22 NOTE — Telephone Encounter (Signed)
Given that he has mild carotid stenosis his cholesterol levels should be lower.  We want his LDL to be less than 70.  Increase pravastatin to 40mg .  Repeat lipids and CMP in 6-8 weeks.  No aspirin given that he is on Eliquis.

## 2018-02-26 MED ORDER — PRAVASTATIN SODIUM 40 MG PO TABS: 40.0000 mg | ORAL_TABLET | Freq: Every day | ORAL | 3 refills | Status: DC

## 2018-02-26 NOTE — Telephone Encounter (Signed)
Advised patient and son, verbalized understanding. Mailed lab orders and Rx sent to [facility]

## 2018-03-05 DIAGNOSIS — J449 Chronic obstructive pulmonary disease, unspecified: Secondary | ICD-10-CM | POA: Diagnosis not present

## 2018-03-05 DIAGNOSIS — L89159 Pressure ulcer of sacral region, unspecified stage: Secondary | ICD-10-CM | POA: Diagnosis not present

## 2018-03-05 DIAGNOSIS — F419 Anxiety disorder, unspecified: Secondary | ICD-10-CM | POA: Diagnosis not present

## 2018-03-05 DIAGNOSIS — J019 Acute sinusitis, unspecified: Secondary | ICD-10-CM | POA: Diagnosis not present

## 2018-03-06 ENCOUNTER — Telehealth: Payer: Self-pay | Admitting: Cardiovascular Disease

## 2018-03-06 ENCOUNTER — Encounter: Payer: Self-pay | Admitting: *Deleted

## 2018-03-06 NOTE — Telephone Encounter (Signed)
New Message:       Pt would like to know when exactly he is needing to have these labs done. Pt has an appt on 9/3 so does he need to get them done now or wait a week before the appt. Pt states if we could put it in [place] when he needs to come.

## 2018-03-06 NOTE — Telephone Encounter (Signed)
Message sent in [place] and spoke with patient. Patient to get labs week before appointment 9/3

## 2018-03-14 DIAGNOSIS — G894 Chronic pain syndrome: Secondary | ICD-10-CM | POA: Diagnosis not present

## 2018-03-14 DIAGNOSIS — M25512 Pain in left shoulder: Secondary | ICD-10-CM | POA: Diagnosis not present

## 2018-03-22 ENCOUNTER — Other Ambulatory Visit: Payer: Self-pay | Admitting: Gastroenterology

## 2018-03-25 ENCOUNTER — Other Ambulatory Visit: Payer: Self-pay | Admitting: Internal Medicine

## 2018-04-06 ENCOUNTER — Other Ambulatory Visit: Payer: Self-pay | Admitting: Gastroenterology

## 2018-04-06 DIAGNOSIS — I10 Essential (primary) hypertension: Secondary | ICD-10-CM | POA: Diagnosis not present

## 2018-04-06 DIAGNOSIS — M1990 Unspecified osteoarthritis, unspecified site: Secondary | ICD-10-CM | POA: Diagnosis not present

## 2018-04-06 DIAGNOSIS — L89159 Pressure ulcer of sacral region, unspecified stage: Secondary | ICD-10-CM | POA: Diagnosis not present

## 2018-04-06 DIAGNOSIS — G8929 Other chronic pain: Secondary | ICD-10-CM | POA: Diagnosis not present

## 2018-04-06 DIAGNOSIS — F419 Anxiety disorder, unspecified: Secondary | ICD-10-CM | POA: Diagnosis not present

## 2018-04-06 DIAGNOSIS — I4891 Unspecified atrial fibrillation: Secondary | ICD-10-CM | POA: Diagnosis not present

## 2018-04-09 DIAGNOSIS — M25562 Pain in left knee: Secondary | ICD-10-CM | POA: Diagnosis not present

## 2018-04-09 DIAGNOSIS — M25512 Pain in left shoulder: Secondary | ICD-10-CM | POA: Diagnosis not present

## 2018-04-09 DIAGNOSIS — M19012 Primary osteoarthritis, left shoulder: Secondary | ICD-10-CM | POA: Diagnosis not present

## 2018-04-16 ENCOUNTER — Ambulatory Visit: Payer: Medicare Other | Admitting: Cardiovascular Disease

## 2018-04-16 DIAGNOSIS — I10 Essential (primary) hypertension: Secondary | ICD-10-CM | POA: Diagnosis not present

## 2018-04-16 DIAGNOSIS — Z5181 Encounter for therapeutic drug level monitoring: Secondary | ICD-10-CM | POA: Diagnosis not present

## 2018-04-16 DIAGNOSIS — E7800 Pure hypercholesterolemia, unspecified: Secondary | ICD-10-CM | POA: Diagnosis not present

## 2018-04-17 LAB — COMPREHENSIVE METABOLIC PANEL
ALT: 37 IU/L (ref 0–44)
AST: 18 IU/L (ref 0–40)
Albumin/Globulin Ratio: 1.7 (ref 1.2–2.2)
Albumin: 4 g/dL (ref 3.5–4.7)
Alkaline Phosphatase: 100 IU/L (ref 39–117)
BILIRUBIN TOTAL: 0.8 mg/dL (ref 0.0–1.2)
BUN / CREAT RATIO: 23 (ref 10–24)
BUN: 12 mg/dL (ref 8–27)
CHLORIDE: 94 mmol/L — AB (ref 96–106)
CO2: 27 mmol/L (ref 20–29)
Calcium: 9.3 mg/dL (ref 8.6–10.2)
Creatinine, Ser: 0.53 mg/dL — ABNORMAL LOW (ref 0.76–1.27)
GFR calc non Af Amer: 98 mL/min/{1.73_m2} (ref >59)
GFR, EST AFRICAN AMERICAN: 113 mL/min/{1.73_m2} (ref >59)
GLUCOSE: 83 mg/dL (ref 65–99)
Globulin, Total: 2.4 g/dL (ref 1.5–4.5)
POTASSIUM: 4.1 mmol/L (ref 3.5–5.2)
Sodium: 136 mmol/L (ref 134–144)
TOTAL PROTEIN: 6.4 g/dL (ref 6.0–8.5)

## 2018-04-17 LAB — LIPID PANEL
CHOL/HDL RATIO: 2.6 ratio (ref 0.0–5.0)
Cholesterol, Total: 134 mg/dL (ref 100–199)
HDL: 51 mg/dL (ref >39)
LDL Calculated: 55 mg/dL (ref 0–99)
Triglycerides: 139 mg/dL (ref 0–149)
VLDL CHOLESTEROL CAL: 28 mg/dL (ref 5–40)

## 2018-04-21 ENCOUNTER — Other Ambulatory Visit: Payer: Self-pay | Admitting: Gastroenterology

## 2018-04-23 ENCOUNTER — Encounter: Payer: Self-pay | Admitting: Cardiovascular Disease

## 2018-04-23 ENCOUNTER — Ambulatory Visit (INDEPENDENT_AMBULATORY_CARE_PROVIDER_SITE_OTHER): Payer: Medicare Other | Admitting: Cardiovascular Disease

## 2018-04-23 VITALS — BP 122/71 | HR 86 | Ht 66.0 in

## 2018-04-23 DIAGNOSIS — I481 Persistent atrial fibrillation: Secondary | ICD-10-CM

## 2018-04-23 DIAGNOSIS — I4819 Other persistent atrial fibrillation: Secondary | ICD-10-CM

## 2018-04-23 DIAGNOSIS — I10 Essential (primary) hypertension: Secondary | ICD-10-CM | POA: Diagnosis not present

## 2018-04-23 DIAGNOSIS — I480 Paroxysmal atrial fibrillation: Secondary | ICD-10-CM | POA: Diagnosis not present

## 2018-04-23 DIAGNOSIS — I6300 Cerebral infarction due to thrombosis of unspecified precerebral artery: Secondary | ICD-10-CM

## 2018-04-23 DIAGNOSIS — K921 Melena: Secondary | ICD-10-CM | POA: Diagnosis not present

## 2018-04-23 DIAGNOSIS — E7800 Pure hypercholesterolemia, unspecified: Secondary | ICD-10-CM

## 2018-04-23 NOTE — Progress Notes (Signed)
Cardiology Office Note   Date:  04/23/2018   ID:  Brian [name], Brian [name] Sep 07, 1932, MRN [id number]  PCP:  Brian Lor, [name]  Cardiologist:   Brian Latch, [name]   No chief complaint on file.    History of Present Illness: Brian [name] is a 84 y.o. male with paroxysmal atrial fibrillation, GI bleeds, prior stroke, hypertension, prior mitral vale repair who presents for follow up.  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  [name]. INR was 2.8 on admission.  [name] was switched to Eliquis 2.5 mg bid prior to discharge.  Brian [name] had a gastric ulcer in 2002 and required transfusion at that time. This was subsequently treated and [name] has not had any recurrent GI bleeding.  Brian [name] had his mitral valve repaired at [facility] in 2002. Brian [name] had an echo 05/06/16 that revealed LVEF 55-60% and mildly elevated pulmonary pressures.  [name] had a [employer] 01/2004 that revealed LVEF 64% and a nonreversible inferior defect that was thought to be diaphragm attenuation versus infarct. [name] had an echo 10/2009 with normal systolic function, mild aortic regurgitation and mild mitral regurgitation.  [name] was last seen by Brian [name] 01/2013 at which time [name] was felt to be in atrial flutter. [name] was [name] rate-controlled and was asymptomatic.   Brian [name]  [name] is concerned that [name] may be gaining too much weight.  [name] typically wears a medium but recently [name] had to increase to an extra-large because his clothes are fitting too tight.  [name] has no lower extremity edema, no orthopnea, and no PND.  [name] has no chest pain or pressure.  [name] also denies any exertional dyspnea.  [name] has a hard time exercising much due to instability and left arm and shoulder pain.  [name] is not able to get any regular exercise.  [name] has not felt any palpitations, lightheadedness, or dizziness.  2 days ago [name] had a  bowel movement and had to strain.  His caregiver told him that [name] had a blood clot in his stool.  [name] has not had any abdominal pain, nausea, or vomiting.  [name] also denies hematemesis.   Past Medical History:  Diagnosis Date  . Anemia   . Atrial flutter ([place])   . BPH (benign prostatic hypertrophy)   . Epistaxis   . Hemorrhage of gastrointestinal tract, unspecified   . History of open heart surgery   . Hypertension   . Osteoarthrosis, unspecified whether generalized or localized, unspecified site   . Personal history of venous thrombosis and embolism   . Rosacea   . Stroke ([place])   . TIA (transient ischemic attack)     Past Surgical History:  Procedure Laterality Date  . APPENDECTOMY    . HERNIA REPAIR    . IR GENERIC HISTORICAL  05/04/2016   IR PERCUTANEOUS ART THROMBECTOMY/INFUSION INTRACRANIAL INC DIAG ANGIO 05/04/2016 Brian [name] [facility]  . IR GENERIC HISTORICAL  05/04/2016   IR ANGIO VERTEBRAL SEL SUBCLAVIAN INNOMINATE UNI R MOD SED 05/04/2016 Brian [name] [facility]  . IR GENERIC HISTORICAL  06/07/2016   IR RADIOLOGIST EVAL & MGMT 06/07/2016 [facility]  . JOINT REPLACEMENT    . MITRAL VALVE REPAIR    . mvp repair    . RADIOLOGY WITH ANESTHESIA N/A 05/04/2016   Procedure: RADIOLOGY WITH ANESTHESIA;  Surgeon: Brian [name]  Brian Pandy, [name];  Location: [facility];  Service: Radiology;  Laterality: N/A;  . TOTAL HIP ARTHROPLASTY       Current Outpatient Medications  Medication Sig Dispense Refill  . ALPRAZolam (XANAX) 0.25 MG tablet Take 1 tablet (0.25 mg total) by mouth at bedtime. 90 tablet 0  . ANUCORT-HC 25 MG suppository UNWRAP AND INSERT 1 SUPPOSITORY RECTALLY TWICE DAILY 12 suppository 0  . apixaban (ELIQUIS) 2.5 MG TABS tablet Take 1 tablet (2.5 mg total) by mouth 2 (two) times daily. 60 tablet 6  . benazepril (LOTENSIN) 5 MG tablet Take 1 tablet (5 mg total) by mouth daily. 90 tablet 3  . Catheters (GIZMO CONDOM CATHETER) MISC USE AS NEEDED 100 each 0    . finasteride (PROSCAR) 5 MG tablet Take 1 tablet (5 mg total) by mouth daily. 90 tablet 3  . HYDROcodone-acetaminophen (NORCO/VICODIN) 5-325 MG tablet 1 tablet every 8-12 hours as needed for pain not to exceed 2 tablets in a 24-hour. 90 tablet 0  . hydrocortisone (ANUSOL-HC) 2.5 % rectal cream Place 1 application rectally 2 (two) times daily. 30 g 0  . loratadine (CLARITIN) 10 MG tablet Take 10 mg by mouth daily.  1  . metoprolol succinate (TOPROL-XL) 25 MG 24 hr tablet TAKE 1 TABLET BY MOUTH EVERY MORNING AND TAKE 2 TABLETS BY MOUTH EVERY EVENING 270 tablet 3  . pantoprazole (PROTONIX) 40 MG tablet Take 1 tablet (40 mg total) daily by mouth. 90 tablet 3  . pravastatin (PRAVACHOL) 40 MG tablet Take 1 tablet (40 mg total) by mouth daily. 90 tablet 3  . sertraline (ZOLOFT) 25 MG tablet Take 1 tablet (25 mg total) by mouth daily. 90 tablet 5  . traMADol (ULTRAM) 50 MG tablet TAKE 1 TABLET BY MOUTH EVERY 8 HOURS AS NEEDED 90 tablet 0   No current facility-administered medications for this visit.     Allergies:   Novocain [procaine hcl]; Other; and Penicillins    Social History:  The patient  reports that [name] has quit smoking. [name] has never used smokeless tobacco. [name] reports that [name] does not drink alcohol or use drugs.   Family History:  The patient's family history includes Coronary artery disease in his father; Rheumatic fever in his mother.    ROS:  Please see the history of present illness.   Otherwise, review of systems are positive for none.   All other systems are reviewed and negative.    PHYSICAL EXAM: VS:  BP 122/71   Pulse 86   Ht 5\' 6"  (1.676 m)   BMI 25.02 kg/m  , BMI Body mass index is 25.02 kg/m. GENERAL:  [name] appearing.  No acute distress HEENT:  Pupils equal round and reactive, fundi not visualized, oral mucosa unremarkable NECK:  No jugular venous distention, waveform within normal limits, carotid upstroke brisk and symmetric, no bruits LUNGS:  Clear to auscultation  bilaterally.  No crackles, rhonchi, or wheezes. HEART:  Regular rate and rhythm.  PMI not displaced or sustained,S1 and S2 within normal limits, no S3, no S4, no clicks, no rubs, III/VI holosystolic murmur at the apex ABD:  Flat, positive bowel sounds normal in frequency in pitch, no bruits, no rebound, no guarding, no midline pulsatile mass, no hepatomegaly, no splenomegaly EXT:  2 plus pulses throughout, no edema, no cyanosis no clubbing SKIN:  No rashes no nodules NEURO:  Cranial nerves II through XII grossly intact, motor grossly intact throughout PSYCH:  Cognitively intact, oriented to person place and time  EKG:  EKG is ordered today. 07/04/16:  Atrial fibrillation. Rate 76 bpm. Left anterior fascicular block. Left ventricular hypertrophy with repolarization abnormalities. 04/23/18: Sinus rhythm.  Rate 86 bpm. LVH with repolarization abnormalities.  Echo 05/06/16: Study Conclusions  - Left ventricle: The cavity size was normal. Wall thickness was   normal. Systolic function was normal. The estimated ejection   fraction was in the range of 55% to 60%. Wall motion was normal;   there were no regional wall motion abnormalities. Mitral   annuloplasty precludes evaluation of LV diastolic function. - Ventricular septum: Septal motion showed paradox. These changes   are consistent with a post-thoracotomy state. - Mitral valve: Prior procedures included surgical repair. An   annular ring prosthesis was present and functioning normally.   Valve area by pressure half-time: 2.47 cm^2. - Left atrium: The atrium was mildly dilated. - Pulmonary arteries: Systolic pressure was mildly increased. PA   peak pressure: 44 mm Hg (S).  Recent Labs: 12/31/2017: Hemoglobin 15.5; Platelets 294.0 04/16/2018: ALT 37; BUN 12; Creatinine, Ser 0.53; Potassium 4.1; Sodium 136    Lipid Panel    Component Value Date/Time   CHOL 134 04/16/2018 1155   TRIG 139 04/16/2018 1155   HDL 51 04/16/2018 1155    CHOLHDL 2.6 04/16/2018 1155   CHOLHDL 3.4 10/03/2016 1157   VLDL 22 10/03/2016 1157   LDLCALC 55 04/16/2018 1155   LDLDIRECT 80.6 05/27/2009 0857      Wt Readings from Last 3 Encounters:  10/31/17 155 lb (70.3 kg)  09/06/17 144 lb (65.3 kg)  09/01/17 144 lb (65.3 kg)      ASSESSMENT AND PLAN:  # Persistent atrial fibrillation:  # Prior embolic stroke/hemorrhagic: Mr. Shorten is in sinus rhythm today.  [name] doesn't feel any different in atrial fibrillation. His Eliquis is underdosed due to history of GI bleed, ICH, low weight (67kg) and age >18.  Rates are [name]-controlled on metoprolol. This patients CHA2DS2-VASc Score and unadjusted Ischemic Stroke Rate (% per year) is equal to 7.2 % stroke rate/year from a score of 5 Above score calculated as 1 point each if present [CHF, HTN, DM, Vascular=MI/PAD/Aortic Plaque, Age if 65-74, or Male] Above score calculated as 2 points each if present [Age > 75, or Stroke/TIA/TE]  # Melena: Mr. Mcquiston had some bleeding after straining for a bowel movement.  [name] states it was a clot which seems to be less likely coming from his hemorrhoids.  Unless [name] had clotted blood in the rectal vault from a prior bleed.  We will check a CBC.  [name] does not appear anemic on exam today.  Continue Eliquis for now.  # Hypertension: Blood pressure is [name]-controlled on metoprolol and benazepril.  # Mitral valve repair: Valve stable on echo 04/2016.  # Hyperlipidemia: LDL 55 on 03/2018.  Continue pravastatin.    Current medicines are reviewed at length with the patient today.  The patient does not have concerns regarding medicines.  The following changes have been made:  no change  Labs/ tests ordered today include:   Orders Placed This Encounter  Procedures  . CBC with Differential/Platelet  . EKG 12-Lead    Disposition:   FU with Tadao Emig C. Oval Linsey, [name], [facility] in 6 months    Signed, Wyllow Seigler C. Oval Linsey, [name], [employer]  04/23/2018 1:54 PM    [place]

## 2018-04-23 NOTE — Patient Instructions (Addendum)
Medication Instructions:  Your physician recommends that you continue on your current medications as directed. Please refer to the Current Medication list given to you today.  Labwork: CBC TODAY  Testing/Procedures: NONE  Follow-Up: Your physician wants you to follow-up in: 6 MONTHS  You will receive a reminder letter in the mail two months in advance. If you don't receive a letter, please call our office to schedule the follow-up appointment.  If you need a refill on your cardiac medications before your next appointment, please call your pharmacy.

## 2018-04-24 LAB — CBC WITH DIFFERENTIAL/PLATELET
BASOS: 0 %
Basophils Absolute: 0 10*3/uL (ref 0.0–0.2)
EOS (ABSOLUTE): 0 10*3/uL (ref 0.0–0.4)
EOS: 0 %
HEMATOCRIT: 44.5 % (ref 37.5–51.0)
HEMOGLOBIN: 15.5 g/dL (ref 13.0–17.7)
IMMATURE GRANS (ABS): 0.1 10*3/uL (ref 0.0–0.1)
IMMATURE GRANULOCYTES: 1 %
LYMPHS: 15 %
Lymphocytes Absolute: 1.7 10*3/uL (ref 0.7–3.1)
MCH: 32.8 pg (ref 26.6–33.0)
MCHC: 34.8 g/dL (ref 31.5–35.7)
MCV: 94 fL (ref 79–97)
MONOCYTES: 9 %
Monocytes Absolute: 1 10*3/uL — ABNORMAL HIGH (ref 0.1–0.9)
NEUTROS PCT: 75 %
Neutrophils Absolute: 8.8 10*3/uL — ABNORMAL HIGH (ref 1.4–7.0)
Platelets: 246 10*3/uL (ref 150–450)
RBC: 4.73 x10E6/uL (ref 4.14–5.80)
RDW: 12.6 % (ref 12.3–15.4)
WBC: 11.7 10*3/uL — AB (ref 3.4–10.8)

## 2018-04-30 ENCOUNTER — Encounter: Payer: Self-pay | Admitting: Family Medicine

## 2018-04-30 ENCOUNTER — Telehealth: Payer: Self-pay | Admitting: Emergency Medicine

## 2018-04-30 ENCOUNTER — Ambulatory Visit (INDEPENDENT_AMBULATORY_CARE_PROVIDER_SITE_OTHER): Payer: Medicare Other | Admitting: Family Medicine

## 2018-04-30 ENCOUNTER — Other Ambulatory Visit: Payer: Self-pay

## 2018-04-30 VITALS — BP 102/60 | HR 87 | Temp 99.1°F

## 2018-04-30 DIAGNOSIS — I10 Essential (primary) hypertension: Secondary | ICD-10-CM

## 2018-04-30 DIAGNOSIS — F419 Anxiety disorder, unspecified: Secondary | ICD-10-CM | POA: Diagnosis not present

## 2018-04-30 DIAGNOSIS — I480 Paroxysmal atrial fibrillation: Secondary | ICD-10-CM

## 2018-04-30 DIAGNOSIS — M150 Primary generalized (osteo)arthritis: Secondary | ICD-10-CM

## 2018-04-30 DIAGNOSIS — M8949 Other hypertrophic osteoarthropathy, multiple sites: Secondary | ICD-10-CM

## 2018-04-30 DIAGNOSIS — M159 Polyosteoarthritis, unspecified: Secondary | ICD-10-CM

## 2018-04-30 DIAGNOSIS — I6300 Cerebral infarction due to thrombosis of unspecified precerebral artery: Secondary | ICD-10-CM

## 2018-04-30 NOTE — Telephone Encounter (Signed)
Patient son would like to know if he should start his dad on the Zoloft tonight along with the Xanax? Or should he just give his dad the Zoloft? Please advise.

## 2018-04-30 NOTE — Progress Notes (Signed)
Subjective:  Patient ID: Brian [name], male    DOB: 1933/03/03  Age: 84 y.o. MRN: [id number]  CC: Establish Care (est care, had a stroke 2 years ago and has arthritis.)   HPI Brian [name] presents for establishment of care by way of transfer.  He is accompanied by his primary caregiver.  Patient has been ambulatory and uses a wheelchair permanently secondary to his history of CVA and osteoarthritis.  He is currently seen by the orthopedic practice who has been prescribing him hydrocodone for pain control.  He is no longer taking Ultram as was listed in his medication list.  He does have a history of hypertension and paroxysmal atrial fibrillation.  These issues have been controlled with Lotensin, metoprolol.  He has a history of BPH with decreased force of stream and nocturia.  He uses a condom catheter at nighttime.  History Brian [name] has a past medical history of Anemia, Atrial flutter ([place]), BPH (benign prostatic hypertrophy), Epistaxis, Hemorrhage of gastrointestinal tract, unspecified, History of open heart surgery, Hypertension, Osteoarthrosis, unspecified whether generalized or localized, unspecified site, Personal history of venous thrombosis and embolism, Rosacea, Stroke ([place]), and TIA (transient ischemic attack).   He has a past surgical history that includes Appendectomy; Hernia repair; Total hip arthroplasty; Joint replacement; mvp repair; Mitral valve repair; Radiology with anesthesia (N/A, 05/04/2016); ir generic historical (05/04/2016); ir generic historical (05/04/2016); and ir generic historical (06/07/2016).   His family history includes Coronary artery disease in his father; Rheumatic fever in his mother.He reports that he has quit smoking. He has never used smokeless tobacco. He reports that he does not drink alcohol or use drugs.  Outpatient Medications Prior to Visit  Medication Sig Dispense Refill  . ALPRAZolam (XANAX) 0.25 MG tablet Take 1 tablet (0.25 mg total) by mouth at  bedtime. 90 tablet 0  . ANUCORT-HC 25 MG suppository UNWRAP AND INSERT 1 SUPPOSITORY RECTALLY TWICE DAILY 12 suppository 0  . apixaban (ELIQUIS) 2.5 MG TABS tablet Take 1 tablet (2.5 mg total) by mouth 2 (two) times daily. 60 tablet 6  . benazepril (LOTENSIN) 5 MG tablet Take 1 tablet (5 mg total) by mouth daily. 90 tablet 3  . benazepril-hydrochlorthiazide (LOTENSIN HCT) 5-6.25 MG tablet benazepril 5 mg-hydrochlorothiazide 6.25 mg tablet    . Catheters (GIZMO CONDOM CATHETER) MISC USE AS NEEDED 100 each 0  . cetirizine (ZYRTEC) 10 MG tablet TK 1 T PO QAM  2  . escitalopram (LEXAPRO) 10 MG tablet escitalopram 10 mg tablet    . finasteride (PROSCAR) 5 MG tablet Take 1 tablet (5 mg total) by mouth daily. 90 tablet 3  . HYDROcodone-acetaminophen (NORCO/VICODIN) 5-325 MG tablet 1 tablet every 8-12 hours as needed for pain not to exceed 2 tablets in a 24-hour. 90 tablet 0  . hydrocortisone (ANUSOL-HC) 2.5 % rectal cream Place 1 application rectally 2 (two) times daily. 30 g 0  . hydrocortisone-pramoxine (ANALPRAM-HC) 2.5-1 % rectal cream hydrocortisone-pramoxine 2.5 %-1 % rectal cream  U REC TID    . ipratropium (ATROVENT) 0.06 % nasal spray ipratropium bromide 42 mcg (0.06 %) nasal spray    . loratadine (CLARITIN) 10 MG tablet Take 10 mg by mouth daily.  1  . metoprolol succinate (TOPROL-XL) 25 MG 24 hr tablet TAKE 1 TABLET BY MOUTH EVERY MORNING AND TAKE 2 TABLETS BY MOUTH EVERY EVENING 270 tablet 3  . oxyCODONE-acetaminophen (PERCOCET/ROXICET) 5-325 MG tablet TK 1 T PO TID PRN  0  . pantoprazole (PROTONIX) 40 MG tablet Take  1 tablet (40 mg total) daily by mouth. 90 tablet 3  . pravastatin (PRAVACHOL) 40 MG tablet Take 1 tablet (40 mg total) by mouth daily. 90 tablet 3  . SANTYL ointment APP TO CLEANSED AFFECTED AREA ONCE D  2  . sertraline (ZOLOFT) 25 MG tablet Take 1 tablet (25 mg total) by mouth daily. 90 tablet 5  . traMADol (ULTRAM) 50 MG tablet TAKE 1 TABLET BY MOUTH EVERY 8 HOURS AS NEEDED  90 tablet 0   No facility-administered medications prior to visit.     ROS Review of Systems  Objective:  BP 102/60 (BP Location: Right Arm, Patient Position: Sitting, Cuff Size: Normal)   Pulse 87   Temp 99.1 F (37.3 C) (Oral)   SpO2 96%   Physical Exam    Assessment & Plan:   Brian [name] was seen today for establish care.  Diagnoses and all orders for this visit:  Essential hypertension  Cerebrovascular accident (CVA) due to thrombosis of precerebral artery (HCC)  Paroxysmal atrial fibrillation ([place])  Primary osteoarthritis involving multiple joints  Anxiety   I am having Brian [name] maintain his pantoprazole, loratadine, GIZMO CONDOM CATHETER, hydrocortisone, HYDROcodone-acetaminophen, ALPRAZolam, benazepril, apixaban, finasteride, metoprolol succinate, sertraline, traMADol, pravastatin, ANUCORT-HC, benazepril-hydrochlorthiazide, cetirizine, SANTYL, escitalopram, hydrocortisone-pramoxine, ipratropium, and oxyCODONE-acetaminophen.  No orders of the defined types were placed in this encounter.  I am concerned about patient's indicated medicine list.  There are redundant medicines.  His caregiver is not exactly aware of exactly what he is taking.  Patient's son is responsible for the medicines.  I think it would be best that the patient return with his son in order that we could discuss exactly what medicines he is taking.  I suggested that it may be helpful to increase the Zoloft.  I think it would be best if a family member return with this patient involve all of the medicines that he is taking with him.  Follow-up: No follow-ups on file.  Libby Maw, MD

## 2018-04-30 NOTE — Telephone Encounter (Signed)
Spoke with pt son regarding question. Informed him that Dr Ethelene Hal needs to see family member in charge of fathers meds and bring them to dr appt to discuss. Son is aware and will make an appt for his father.

## 2018-04-30 NOTE — Telephone Encounter (Signed)
Patient needs to return to the clinic with all of his medicines and the family member who has been responsible for administering them.

## 2018-05-07 ENCOUNTER — Other Ambulatory Visit: Payer: Self-pay | Admitting: Gastroenterology

## 2018-05-07 DIAGNOSIS — L812 Freckles: Secondary | ICD-10-CM | POA: Diagnosis not present

## 2018-05-07 DIAGNOSIS — D1801 Hemangioma of skin and subcutaneous tissue: Secondary | ICD-10-CM | POA: Diagnosis not present

## 2018-05-07 DIAGNOSIS — L821 Other seborrheic keratosis: Secondary | ICD-10-CM | POA: Diagnosis not present

## 2018-05-08 DIAGNOSIS — B351 Tinea unguium: Secondary | ICD-10-CM | POA: Diagnosis not present

## 2018-05-08 DIAGNOSIS — B353 Tinea pedis: Secondary | ICD-10-CM | POA: Diagnosis not present

## 2018-05-08 DIAGNOSIS — M2042 Other hammer toe(s) (acquired), left foot: Secondary | ICD-10-CM | POA: Diagnosis not present

## 2018-05-08 DIAGNOSIS — M2041 Other hammer toe(s) (acquired), right foot: Secondary | ICD-10-CM | POA: Diagnosis not present

## 2018-05-11 DIAGNOSIS — I10 Essential (primary) hypertension: Secondary | ICD-10-CM | POA: Diagnosis not present

## 2018-05-11 DIAGNOSIS — I4891 Unspecified atrial fibrillation: Secondary | ICD-10-CM | POA: Diagnosis not present

## 2018-05-11 DIAGNOSIS — F419 Anxiety disorder, unspecified: Secondary | ICD-10-CM | POA: Diagnosis not present

## 2018-05-11 DIAGNOSIS — L89159 Pressure ulcer of sacral region, unspecified stage: Secondary | ICD-10-CM | POA: Diagnosis not present

## 2018-05-11 DIAGNOSIS — M6281 Muscle weakness (generalized): Secondary | ICD-10-CM | POA: Diagnosis not present

## 2018-05-12 ENCOUNTER — Emergency Department (HOSPITAL_COMMUNITY)
Admission: EM | Admit: 2018-05-12 | Discharge: 2018-05-12 | Disposition: A | Discharge status: Home / Self Care | Payer: Medicare Other | Attending: Emergency Medicine | Admitting: Emergency Medicine

## 2018-05-12 ENCOUNTER — Other Ambulatory Visit: Payer: Self-pay

## 2018-05-12 ENCOUNTER — Emergency Department (HOSPITAL_COMMUNITY): Payer: Medicare Other

## 2018-05-12 ENCOUNTER — Encounter (HOSPITAL_COMMUNITY): Payer: Self-pay | Admitting: Emergency Medicine

## 2018-05-12 DIAGNOSIS — I2510 Atherosclerotic heart disease of native coronary artery without angina pectoris: Secondary | ICD-10-CM | POA: Insufficient documentation

## 2018-05-12 DIAGNOSIS — I10 Essential (primary) hypertension: Secondary | ICD-10-CM | POA: Insufficient documentation

## 2018-05-12 DIAGNOSIS — Z79899 Other long term (current) drug therapy: Secondary | ICD-10-CM | POA: Diagnosis not present

## 2018-05-12 DIAGNOSIS — R531 Weakness: Secondary | ICD-10-CM | POA: Diagnosis present

## 2018-05-12 DIAGNOSIS — N3000 Acute cystitis without hematuria: Secondary | ICD-10-CM

## 2018-05-12 DIAGNOSIS — R05 Cough: Secondary | ICD-10-CM | POA: Diagnosis not present

## 2018-05-12 DIAGNOSIS — Z87891 Personal history of nicotine dependence: Secondary | ICD-10-CM | POA: Insufficient documentation

## 2018-05-12 DIAGNOSIS — J029 Acute pharyngitis, unspecified: Secondary | ICD-10-CM | POA: Diagnosis not present

## 2018-05-12 LAB — COMPREHENSIVE METABOLIC PANEL
ALBUMIN: 3.5 g/dL (ref 3.5–5.0)
ALT: 24 U/L (ref 0–44)
AST: 25 U/L (ref 15–41)
Alkaline Phosphatase: 73 U/L (ref 38–126)
Anion gap: 11 (ref 5–15)
BUN: 11 mg/dL (ref 8–23)
CHLORIDE: 100 mmol/L (ref 98–111)
CO2: 24 mmol/L (ref 22–32)
CREATININE: 0.53 mg/dL — AB (ref 0.61–1.24)
Calcium: 9 mg/dL (ref 8.9–10.3)
GFR calc Af Amer: >60 mL/min (ref >60)
GFR calc non Af Amer: >60 mL/min (ref >60)
GLUCOSE: 92 mg/dL (ref 70–99)
POTASSIUM: 4 mmol/L (ref 3.5–5.1)
Sodium: 135 mmol/L (ref 135–145)
Total Bilirubin: 0.8 mg/dL (ref 0.3–1.2)
Total Protein: 6.4 g/dL — ABNORMAL LOW (ref 6.5–8.1)

## 2018-05-12 LAB — CBC WITH DIFFERENTIAL/PLATELET
Abs Immature Granulocytes: 0.1 10*3/uL (ref 0.0–0.1)
Basophils Absolute: 0 10*3/uL (ref 0.0–0.1)
Basophils Relative: 0 %
EOS ABS: 0 10*3/uL (ref 0.0–0.7)
Eosinophils Relative: 0 %
HCT: 50.4 % (ref 39.0–52.0)
HEMOGLOBIN: 16.4 g/dL (ref 13.0–17.0)
Immature Granulocytes: 1 %
LYMPHS PCT: 15 %
Lymphs Abs: 1.2 10*3/uL (ref 0.7–4.0)
MCH: 32.5 pg (ref 26.0–34.0)
MCHC: 32.5 g/dL (ref 30.0–36.0)
MCV: 99.8 fL (ref 78.0–100.0)
MONO ABS: 0.7 10*3/uL (ref 0.1–1.0)
MONOS PCT: 9 %
Neutro Abs: 6 10*3/uL (ref 1.7–7.7)
Neutrophils Relative %: 75 %
Platelets: 226 10*3/uL (ref 150–400)
RBC: 5.05 MIL/uL (ref 4.22–5.81)
RDW: 13.4 % (ref 11.5–15.5)
WBC: 8 10*3/uL (ref 4.0–10.5)

## 2018-05-12 LAB — URINALYSIS, ROUTINE W REFLEX MICROSCOPIC
Bilirubin Urine: NEGATIVE
Glucose, UA: NEGATIVE mg/dL
KETONES UR: NEGATIVE mg/dL
Nitrite: POSITIVE — AB
PROTEIN: NEGATIVE mg/dL
Specific Gravity, Urine: 1.014 (ref 1.005–1.030)
pH: 5 (ref 5.0–8.0)

## 2018-05-12 LAB — I-STAT CG4 LACTIC ACID, ED: LACTIC ACID, VENOUS: 1.67 mmol/L (ref 0.5–1.9)

## 2018-05-12 LAB — LIPASE, BLOOD: LIPASE: 30 U/L (ref 11–51)

## 2018-05-12 LAB — GROUP A STREP BY PCR: Group A Strep by PCR: NOT DETECTED

## 2018-05-12 MED ORDER — SODIUM CHLORIDE 0.9 % IV SOLN
1.0000 g | Freq: Once | INTRAVENOUS | Status: AC
  Administered 2018-05-12: 1 g via INTRAVENOUS
  Filled 2018-05-12: qty 10

## 2018-05-12 MED ORDER — CEPHALEXIN 500 MG PO CAPS: 500.0000 mg | ORAL_CAPSULE | Freq: Two times a day (BID) | ORAL | 0 refills | Status: DC

## 2018-05-12 NOTE — ED Notes (Signed)
Pt was unable to provide urine specimen at this time.  Will attempt again in a few minutes.

## 2018-05-12 NOTE — ED Notes (Signed)
Patient given PO fluids and urinal for specimen.

## 2018-05-12 NOTE — ED Notes (Signed)
Patient tolerated PO fluids well. No nausea/vomiting noted at this time.

## 2018-05-12 NOTE — ED Triage Notes (Addendum)
Pt arrives today for eval of left ear pain and sore throat for several days. States he has been nauseated today and threw up after lunch. Pt also reports he has no abdominal pain. Nurse aid reports he has had a cough.

## 2018-05-12 NOTE — ED Notes (Signed)
Patient transported to X-ray 

## 2018-05-12 NOTE — ED Provider Notes (Signed)
[facility] EMERGENCY DEPARTMENT Provider Note   CSN: [id number] Arrival date & time: 05/12/18  1640     History   Chief Complaint Chief Complaint  Patient presents with  . Emesis  . Sore Throat  . Otalgia    HPI Brian [name] is a 84 y.o. male.  Patient is a 84 year old male with a history of atrial flutter, hypertension, prior stroke with left-sided weakness who presents with sore throat.  He states that he has had a sore throat since yesterday.  Feels a little bit better now.  He also had some pain in his left ear earlier but denies any current pain.  He has some rhinorrhea which is consistent with his past allergy symptoms.  No known fevers.  No cough or chest congestion.  He states he had one episode of vomiting earlier today but currently does not feel nauseated.  No abdominal pain.  No urinary symptoms.  He has chronic constipation but had a bowel movement this morning which he says was normal.  He does say he has a decubitus ulcer on his sacrum which is a little bit sore.  He is nonambulatory and wheelchair-bound.     Past Medical History:  Diagnosis Date  . Anemia   . Atrial flutter ([place])   . BPH (benign prostatic hypertrophy)   . Epistaxis   . Hemorrhage of gastrointestinal tract, unspecified   . History of open heart surgery   . Hypertension   . Osteoarthrosis, unspecified whether generalized or localized, unspecified site   . Personal history of venous thrombosis and embolism   . Rosacea   . Stroke ([place])   . TIA (transient ischemic attack)     Patient Active Problem List   Diagnosis Date Noted  . Anxiety 04/30/2018  . Lumbar radiculopathy 02/14/2018  . Pain in joint of left shoulder 12/28/2017  . Osteoarthritis of left glenohumeral joint 12/28/2017  . Pain in left foot 10/23/2017  . CAP (community acquired pneumonia) 03/13/2017  . Coronary artery disease involving native coronary artery of native heart without angina pectoris   .  Paroxysmal atrial fibrillation (HCC)   . Dysarthria, post-stroke   . Dysphagia, post-stroke   . Cerebrovascular accident (CVA) due to thrombosis of precerebral artery ([place])   . Scoliosis (and kyphoscoliosis), idiopathic 01/26/2014  . Hip pain 02/07/2012  . Osteoarthritis of hip 02/07/2012  . Sacroiliitis ([place]) 02/07/2012  . ROTATOR CUFF SYNDROME, LEFT 03/07/2010  . OTH MALIG NEOPLASM SKIN OTH&UNSPEC PARTS FACE 12/06/2009  . ACTINIC KERATOSIS, HEAD 09/06/2009  . Osteoarthritis 02/20/2009  . MITRAL VALVE REPLACEMENT, HX OF 02/20/2009  . HIP REPLACEMENT, TOTAL, HX OF 02/20/2009  . APPENDECTOMY, HX OF 02/20/2009  . HERNIORRHAPHY, HX OF 02/20/2009  . EPISTAXIS, RECURRENT 11/19/2008  . UNS ADVRS EFF UNS RX MEDICINAL&BIOLOGICAL SBSTNC 06/12/2008  . OTHER AND UNSPECIFIED MITRAL VALVE DISEASES 03/12/2008  . ATRIAL FLUTTER 03/12/2008  . Essential hypertension 03/06/2007  . PEPTIC ULCER DISEASE 03/06/2007  . BENIGN PROSTATIC HYPERTROPHY 03/06/2007  . Coronary atherosclerosis 02/13/2007  . ROSACEA 02/13/2007  . DVT, HX OF 02/13/2007    Past Surgical History:  Procedure Laterality Date  . APPENDECTOMY    . HERNIA REPAIR    . IR GENERIC HISTORICAL  05/04/2016   IR PERCUTANEOUS ART THROMBECTOMY/INFUSION INTRACRANIAL INC DIAG ANGIO 05/04/2016 [name], MD [facility]  . IR GENERIC HISTORICAL  05/04/2016   IR ANGIO VERTEBRAL SEL SUBCLAVIAN INNOMINATE UNI R MOD SED 05/04/2016 [name], MD [facility]  .  IR GENERIC HISTORICAL  06/07/2016   IR RADIOLOGIST EVAL & MGMT 06/07/2016 [facility]  . JOINT REPLACEMENT    . MITRAL VALVE REPAIR    . mvp repair    . RADIOLOGY WITH ANESTHESIA N/A 05/04/2016   Procedure: RADIOLOGY WITH ANESTHESIA;  Surgeon: [name], MD;  Location: [facility];  Service: Radiology;  Laterality: N/A;  . TOTAL HIP ARTHROPLASTY          Home Medications    Prior to Admission medications   Medication Sig Start Date End Date Taking? Authorizing  Provider  ALPRAZolam (XANAX) 0.25 MG tablet Take 1 tablet (0.25 mg total) by mouth at bedtime. 12/31/17   Marletta Lor, MD  ANUCORT-HC 25 MG suppository UNWRAP AND INSERT 1 SUPPOSITORY RECTALLY TWICE DAILY 05/07/18   Ladene Artist, MD  apixaban (ELIQUIS) 2.5 MG TABS tablet Take 1 tablet (2.5 mg total) by mouth 2 (two) times daily. 12/31/17   Marletta Lor, MD  benazepril (LOTENSIN) 5 MG tablet Take 1 tablet (5 mg total) by mouth daily. 12/31/17   Marletta Lor, MD  benazepril-hydrochlorthiazide (LOTENSIN HCT) 5-6.25 MG tablet benazepril 5 mg-hydrochlorothiazide 6.25 mg tablet    [provider]  Catheters (GIZMO CONDOM CATHETER) MISC USE AS NEEDED 11/15/17   Nafziger, Tommi Rumps, NP  cephALEXin (KEFLEX) 500 MG capsule Take 1 capsule (500 mg total) by mouth 2 (two) times daily. 05/12/18   Malvin Johns, MD  cetirizine (ZYRTEC) 10 MG tablet TK 1 T PO QAM 04/24/18   [provider]  escitalopram (LEXAPRO) 10 MG tablet escitalopram 10 mg tablet    [provider]  finasteride (PROSCAR) 5 MG tablet Take 1 tablet (5 mg total) by mouth daily. 12/31/17   Marletta Lor, MD  HYDROcodone-acetaminophen (NORCO/VICODIN) 5-325 MG tablet 1 tablet every 8-12 hours as needed for pain not to exceed 2 tablets in a 24-hour. 12/31/17   Marletta Lor, MD  hydrocortisone (ANUSOL-HC) 2.5 % rectal cream Place 1 application rectally 2 (two) times daily. 11/22/17   Marletta Lor, MD  hydrocortisone-pramoxine [facility]) 2.5-1 % rectal cream hydrocortisone-pramoxine 2.5 %-1 % rectal cream  U REC TID    [provider]  ipratropium (ATROVENT) 0.06 % nasal spray ipratropium bromide 42 mcg (0.06 %) nasal spray    [provider]  loratadine (CLARITIN) 10 MG tablet Take 10 mg by mouth daily. 10/18/17   [provider]  metoprolol succinate (TOPROL-XL) 25 MG 24 hr tablet TAKE 1 TABLET BY MOUTH EVERY MORNING AND TAKE 2 TABLETS BY MOUTH EVERY EVENING  12/31/17   Marletta Lor, MD  oxyCODONE-acetaminophen (PERCOCET/ROXICET) 5-325 MG tablet TK 1 T PO TID PRN 04/23/18   [provider]  pantoprazole (PROTONIX) 40 MG tablet Take 1 tablet (40 mg total) daily by mouth. 07/03/17   Marletta Lor, MD  pravastatin (PRAVACHOL) 40 MG tablet Take 1 tablet (40 mg total) by mouth daily. 02/26/18   Skeet Latch, MD  SANTYL ointment APP TO CLEANSED AFFECTED AREA ONCE D 04/06/18   [provider]  sertraline (ZOLOFT) 25 MG tablet Take 1 tablet (25 mg total) by mouth daily. 12/31/17   Marletta Lor, MD    Family History Family History  Problem Relation Age of Onset  . Rheumatic fever Mother   . Coronary artery disease Father     Social History Social History   Tobacco Use  . Smoking status: Former Research scientist (life sciences)  . Smokeless tobacco: Never Used  . Tobacco comment: quit  57 yr ago  Substance Use Topics  . Alcohol use: No  . Drug use: No     Allergies   Novocain [procaine hcl]; Other; and Penicillins   Review of Systems Review of Systems  Constitutional: Negative for chills, diaphoresis, fatigue and fever.  HENT: Positive for ear pain and sore throat. Negative for congestion, rhinorrhea and sneezing.   Eyes: Negative.   Respiratory: Negative for cough, chest tightness and shortness of breath.   Cardiovascular: Negative for chest pain and leg swelling.  Gastrointestinal: Positive for nausea and vomiting. Negative for abdominal pain, blood in stool and diarrhea.  Genitourinary: Negative for difficulty urinating, flank pain, frequency and hematuria.  Musculoskeletal: Negative for arthralgias and back pain.  Skin: Negative for rash.  Neurological: Negative for dizziness, speech difficulty, weakness, numbness and headaches.     Physical Exam Updated Vital Signs BP 126/89   Pulse 86   Temp 98.6 F (37 C) (Oral)   Resp 14   SpO2 100%   Physical Exam  Constitutional: He is oriented to person, place, and  time. He appears well-developed and well-nourished.  HENT:  Head: Normocephalic and atraumatic.  Right Ear: Tympanic membrane and ear canal normal.  Left Ear: Tympanic membrane and ear canal normal.  Mouth/Throat: Uvula is midline and oropharynx is clear and moist. No oral lesions. No uvula swelling. No oropharyngeal exudate, posterior oropharyngeal edema, posterior oropharyngeal erythema or tonsillar abscesses.  Eyes: Pupils are equal, round, and reactive to light.  Neck: Normal range of motion. Neck supple.  Cardiovascular: Normal rate, regular rhythm and normal heart sounds.  Pulmonary/Chest: Effort normal and breath sounds normal. No respiratory distress. He has no wheezes. He has no rales. He exhibits no tenderness.  Abdominal: Soft. Bowel sounds are normal. There is no tenderness. There is no rebound and no guarding.  Musculoskeletal: Normal range of motion. He exhibits no edema.  Lymphadenopathy:    He has no cervical adenopathy.  Neurological: He is alert and oriented to person, place, and time.  Skin: Skin is warm and dry. No rash noted.  Psychiatric: He has a normal mood and affect.     ED Treatments / Results  Labs (all labs ordered are listed, but only abnormal results are displayed) Labs Reviewed  COMPREHENSIVE METABOLIC PANEL - Abnormal; Notable for the following components:      Result Value   Creatinine, Ser 0.53 (*)    Total Protein 6.4 (*)    All other components within normal limits  URINALYSIS, ROUTINE W REFLEX MICROSCOPIC - Abnormal; Notable for the following components:   APPearance HAZY (*)    Hgb urine dipstick MODERATE (*)    Nitrite POSITIVE (*)    Leukocytes, UA LARGE (*)    WBC, UA >50 (*)    Bacteria, UA MANY (*)    All other components within normal limits  GROUP A STREP BY PCR  URINE CULTURE  CBC WITH DIFFERENTIAL/PLATELET  LIPASE, BLOOD  I-STAT CG4 LACTIC ACID, ED    EKG EKG Interpretation  Date/Time:  Sunday May 12 2018 18:54:04  EDT Ventricular Rate:  87 PR Interval:    QRS Duration: 127 QT Interval:  386 QTC Calculation: 465 R Axis:   -52 Text Interpretation:  Accelerated junctional rhythm LVH with IVCD, LAD and secondary repol abnrm since last tracing no significant change Confirmed by Malvin Johns [phone]) on 05/12/2018 7:39:00 PM   Radiology Dg Chest 2 View  Result Date: 05/12/2018 CLINICAL DATA:  Cough EXAM: CHEST - 2 VIEW  COMPARISON:  03/27/2017 FINDINGS: Prior median sternotomy and valve replacement. Mild cardiomegaly. Left base atelectasis or scarring. Right lung clear. No effusions or acute bony abnormality. IMPRESSION: Cardiomegaly.  Left base scarring or atelectasis. Electronically Signed   By: Rolm Baptise M.D.   On: 05/12/2018 17:56    Procedures Procedures (including critical care time)  Medications Ordered in ED Medications  cefTRIAXone (ROCEPHIN) 1 g in sodium chloride 0.9 % 100 mL IVPB (1 g Intravenous New Bag/Given 05/12/18 2128)     Initial Impression / Assessment and Plan / ED Course  I have reviewed the triage vital signs and the nursing notes.  Pertinent labs & imaging results that were available during my care of the patient were reviewed by me and considered in my medical decision making (see chart for details).     Patient is a 84 year old male who presents with little bit of sore throat and earache although the symptoms have essentially resolved.  His throat exam is normal.  His ear exam is non-concerning.  He also had an episode of vomiting.  He had no symptoms that would be more concerning for ACS.  No abdominal pain.  He does have evidence of urinary tract infection which could be the etiology of his symptoms.  He has had no nausea or vomiting since arrival in the ED.  He is able to tolerate oral fluids without complaints.  His urine sent for culture.  He was given a dose of IV Rocephin.  He will be discharged on Keflex.  His other labs are reviewed and are non-concerning.  He was  encouraged to have close follow-up with his PCP.  He was given strict return precautions.  Final Clinical Impressions(s) / ED Diagnoses   Final diagnoses:  Acute cystitis without hematuria    ED Discharge Orders         Ordered    cephALEXin (KEFLEX) 500 MG capsule  2 times daily     05/12/18 2224           Malvin Johns, MD 05/12/18 2227

## 2018-05-14 DIAGNOSIS — Z79891 Long term (current) use of opiate analgesic: Secondary | ICD-10-CM | POA: Diagnosis not present

## 2018-05-15 LAB — URINE CULTURE

## 2018-05-16 ENCOUNTER — Telehealth: Payer: Self-pay | Admitting: Emergency Medicine

## 2018-05-16 ENCOUNTER — Telehealth: Payer: Self-pay | Admitting: *Deleted

## 2018-05-16 NOTE — Telephone Encounter (Signed)
Spoke with patient regarding scheduling a nurse visit to receive the high dose flu vaccine. Patient states that he will look at his calendar and the office a call back to schedule.

## 2018-05-16 NOTE — Progress Notes (Signed)
ED Antimicrobial Stewardship Positive Culture Follow Up   [name] is an 84 y.o. male who presented to [facility] on 05/12/2018 with a chief complaint of  Chief Complaint  Patient presents with  . Emesis  . Sore Throat  . Otalgia    Recent Results (from the past 720 hour(s))  Group A Strep by PCR     Status: None   Collection Time: 05/12/18  4:56 PM  Result Value Ref Range Status   Group A Strep by PCR NOT DETECTED NOT DETECTED Final    Comment: Performed at [facility], [address] [place], [place] [address]  Urine culture     Status: Abnormal   Collection Time: 05/12/18  7:57 PM  Result Value Ref Range Status   Specimen Description URINE, CLEAN CATCH  Final   Special Requests   Final    NONE Performed at [facility], [place] [address] [place], [place] [address]    Culture >=100,000 COLONIES/mL STAPHYLOCOCCUS EPIDERMIDIS (A)  Final   Report Status 05/15/2018 FINAL  Final   Organism ID, Bacteria STAPHYLOCOCCUS EPIDERMIDIS (A)  Final      Susceptibility   Staphylococcus epidermidis - MIC*    CIPROFLOXACIN <=0.5 SENSITIVE Sensitive     GENTAMICIN <=0.5 SENSITIVE Sensitive     NITROFURANTOIN <=16 SENSITIVE Sensitive     OXACILLIN >=4 RESISTANT Resistant     TETRACYCLINE >=16 RESISTANT Resistant     VANCOMYCIN 1 SENSITIVE Sensitive     TRIMETH/SULFA <=10 SENSITIVE Sensitive     CLINDAMYCIN >=8 RESISTANT Resistant     RIFAMPIN <=0.5 SENSITIVE Sensitive     Inducible Clindamycin NEGATIVE Sensitive     * >=100,000 COLONIES/mL STAPHYLOCOCCUS EPIDERMIDIS    [x]  Treated with cephalexin, organism resistant to prescribed antimicrobial []  Patient discharged originally without antimicrobial agent and treatment is now indicated  New antibiotic prescription: DC cephalexin, start macrobid 100mg  PO BID x 7 days  ED Provider: Dalia Heading, PA   [name], Rande Lawman 05/16/2018, 9:57 AM Clinical Pharmacist Monday - Friday phone -  [phone] Saturday -  Sunday phone - [phone]

## 2018-05-16 NOTE — Telephone Encounter (Signed)
Post ED Visit - Positive Culture Follow-up: Unsuccessful Patient Follow-up  Culture assessed and recommendations reviewed by:  []  Elenor Quinones, Pharm.D. []  Heide Guile, Pharm.D., BCPS AQ-ID []  Parks Neptune, Pharm.D., BCPS []  Alycia Rossetti, Pharm.D., BCPS []  [place], Pharm.D., BCPS, AAHIVP []  Legrand Como, Pharm.D., BCPS, AAHIVP []  Wynell Balloon, PharmD []  Vincenza Hews, PharmD, BCPS Salome Arnt, PharmD  Positive urine culture, reviewed by Irena Cords, PA  []  Patient discharged without antimicrobial prescription and treatment is now indicated [x]  Organism is resistant to prescribed ED discharge antimicrobial []  Patient with positive blood cultures  Plan:  D/C Cephalexin; Start Macrobid 100mg  PO BID x 7 days  Unable to contact patient after 3 attempts, letter will be sent to address on file  Ardeen Fillers 05/16/2018, 10:25 AM

## 2018-05-21 ENCOUNTER — Other Ambulatory Visit: Payer: Self-pay | Admitting: Gastroenterology

## 2018-05-29 ENCOUNTER — Telehealth: Payer: Self-pay

## 2018-05-29 NOTE — Telephone Encounter (Signed)
Okay for these to be ordered for patient?       Copied from [place] [id number]. Topic: General - Other >> May 29, 2018  3:41 PM Ivar Drape wrote: Reason for CRM:  [facility] Living w[facility], patient's caregiver, [phone] would like a call back from the provider's nurse concerning the [facility] and Specialty mattress.

## 2018-05-30 NOTE — Telephone Encounter (Signed)
Okay for hoyer lift and special hospital bed.

## 2018-05-30 NOTE — Telephone Encounter (Signed)
I called and spoke with Brian [name] I advised her that we would contact [facility] to see if they can provide the hoyer lift & speciality mattress. As soon as I get a response, I will contact Brian [name] back. Brian [name] is in agreement with the plan.

## 2018-05-31 NOTE — Telephone Encounter (Signed)
I called and made Brian [name] aware that the orders have been faxed back to [facility] and they should be receiving the items soon.

## 2018-05-31 NOTE — Telephone Encounter (Signed)
I tried calling Claiborne Billings to update her on [employer]. [facility] is faxing over a hard copy Rx for Dr. Ethelene Hal to sign. Once it is signed & faxed back, they will start getting the hoyer lift & speciality mattress ready for patient.

## 2018-06-01 DIAGNOSIS — Z87891 Personal history of nicotine dependence: Secondary | ICD-10-CM | POA: Diagnosis not present

## 2018-06-01 DIAGNOSIS — F419 Anxiety disorder, unspecified: Secondary | ICD-10-CM | POA: Diagnosis not present

## 2018-06-01 DIAGNOSIS — M069 Rheumatoid arthritis, unspecified: Secondary | ICD-10-CM | POA: Diagnosis not present

## 2018-06-01 DIAGNOSIS — Z9181 History of falling: Secondary | ICD-10-CM | POA: Diagnosis not present

## 2018-06-01 DIAGNOSIS — M1712 Unilateral primary osteoarthritis, left knee: Secondary | ICD-10-CM | POA: Diagnosis not present

## 2018-06-01 DIAGNOSIS — I69354 Hemiplegia and hemiparesis following cerebral infarction affecting left non-dominant side: Secondary | ICD-10-CM | POA: Diagnosis not present

## 2018-06-01 DIAGNOSIS — M5416 Radiculopathy, lumbar region: Secondary | ICD-10-CM | POA: Diagnosis not present

## 2018-06-01 DIAGNOSIS — Z7901 Long term (current) use of anticoagulants: Secondary | ICD-10-CM | POA: Diagnosis not present

## 2018-06-01 DIAGNOSIS — M5136 Other intervertebral disc degeneration, lumbar region: Secondary | ICD-10-CM | POA: Diagnosis not present

## 2018-06-01 DIAGNOSIS — M19012 Primary osteoarthritis, left shoulder: Secondary | ICD-10-CM | POA: Diagnosis not present

## 2018-06-03 ENCOUNTER — Other Ambulatory Visit: Payer: Self-pay | Admitting: Internal Medicine

## 2018-06-03 ENCOUNTER — Other Ambulatory Visit: Payer: Self-pay | Admitting: Gastroenterology

## 2018-06-03 NOTE — Telephone Encounter (Signed)
Need to see this patient with a FAMILY MEMBER.

## 2018-06-03 NOTE — Telephone Encounter (Signed)
Copied from [place] [id number]. Topic: Quick Communication - Rx Refill/Question >> Jun 03, 2018 12:56 PM Scherrie Gerlach wrote: Medication: doxycycline (VIBRA-TABS) 100 MG tablet Pt's employer, [employer] calling for the pt to request this refill. Pt used to take this Rx all the time, and Dr Raliegh Ip took him off it this past June 2019. Pt sees Dr Ethelene Hal now Since then, pt has had pna, and urinary tract infections.  Pt is requesting to go back on this med, as he had been taking for years. Norm states please call the pt at home with questions, and answer. [id number] - [place], [place] - [address] AT [facility] OF [place] [phone] (Phone) [phone] (Fax)

## 2018-06-03 NOTE — Telephone Encounter (Signed)
Called and spoke with patient's son. He will call back to schedule an appointment for when he can bring his dad in.

## 2018-06-05 ENCOUNTER — Ambulatory Visit: Payer: Self-pay

## 2018-06-05 DIAGNOSIS — I69354 Hemiplegia and hemiparesis following cerebral infarction affecting left non-dominant side: Secondary | ICD-10-CM | POA: Diagnosis not present

## 2018-06-05 DIAGNOSIS — M5136 Other intervertebral disc degeneration, lumbar region: Secondary | ICD-10-CM | POA: Diagnosis not present

## 2018-06-05 DIAGNOSIS — M19012 Primary osteoarthritis, left shoulder: Secondary | ICD-10-CM | POA: Diagnosis not present

## 2018-06-05 DIAGNOSIS — M069 Rheumatoid arthritis, unspecified: Secondary | ICD-10-CM | POA: Diagnosis not present

## 2018-06-05 DIAGNOSIS — M5416 Radiculopathy, lumbar region: Secondary | ICD-10-CM | POA: Diagnosis not present

## 2018-06-05 DIAGNOSIS — M1712 Unilateral primary osteoarthritis, left knee: Secondary | ICD-10-CM | POA: Diagnosis not present

## 2018-06-05 NOTE — Telephone Encounter (Signed)
Pt called with medication question He has cold symptoms and wanted to take Mucinex DM. Pt was instructed to consult his  Pharmacist for interaction with different OTC medications. Pt was triage for his symptoms productive cough Runny nose. Pt denies fever..Protocol was to schedule appointment within 3 days. Pt stated that he wanted his appointment on Tuesday. I explained that he really need to be seen sooner but pt says his son is available on Tuesday only to help him. He is wheelchair bound from recent stroke. Appointment made for Tuesday October 22nd. Pt urged to call back if symptoms become worse. Care advice read to patient. Patient verbalized understanding of all instructions   Reason for Disposition . Caller has medication question about med not prescribed by PCP and triager unable to answer question (e.g., compatibility with other med, storage) . [1] Nasal discharge AND [2] present > 10 days  Answer Assessment - Initial Assessment Questions 1. SYMPTOMS: "Do you have any symptoms?"     no 2. SEVERITY: If symptoms are present, ask "Are they mild, moderate or severe?"     severe  Answer Assessment - Initial Assessment Questions 1. ONSET: "When did the cough begin?"      Couple days ago 2. SEVERITY: "How bad is the cough today?"      severe 3. RESPIRATORY DISTRESS: "Describe your breathing."      no 4. FEVER: "Do you have a fever?" If so, ask: "What is your temperature, how was it measured, and when did it start?"     no 5. SPUTUM: "Describe the color of your sputum" (clear, white, yellow, green)     yellow 6. HEMOPTYSIS: "Are you coughing up any blood?" If so ask: "How much?" (flecks, streaks, tablespoons, etc.)     no 7. CARDIAC HISTORY: "Do you have any history of heart disease?" (e.g., heart attack, congestive heart failure)      aarrhythmia 8. LUNG HISTORY: "Do you have any history of lung disease?"  (e.g., pulmonary embolus, asthma, emphysema)     no 9. PE RISK FACTORS: "Do you  have a history of blood clots?" (or: recent major surgery, recent prolonged travel, bedridden)     Stroke 2 years ago 10. OTHER SYMPTOMS: "Do you have any other symptoms?" (e.g., runny nose, wheezing, chest pain)       Runny nose 11. PREGNANCY: "Is there any chance you are pregnant?" "When was your last menstrual period?"       N/A 12. TRAVEL: "Have you traveled out of the country in the last month?" (e.g., travel history, exposures)      No  Protocols used: MEDICATION QUESTION CALL-A-AH, COUGH - ACUTE PRODUCTIVE-A-AH

## 2018-06-06 DIAGNOSIS — M069 Rheumatoid arthritis, unspecified: Secondary | ICD-10-CM | POA: Diagnosis not present

## 2018-06-06 DIAGNOSIS — I69354 Hemiplegia and hemiparesis following cerebral infarction affecting left non-dominant side: Secondary | ICD-10-CM | POA: Diagnosis not present

## 2018-06-06 DIAGNOSIS — M1712 Unilateral primary osteoarthritis, left knee: Secondary | ICD-10-CM | POA: Diagnosis not present

## 2018-06-06 DIAGNOSIS — M19012 Primary osteoarthritis, left shoulder: Secondary | ICD-10-CM | POA: Diagnosis not present

## 2018-06-06 DIAGNOSIS — M5416 Radiculopathy, lumbar region: Secondary | ICD-10-CM | POA: Diagnosis not present

## 2018-06-06 DIAGNOSIS — M5136 Other intervertebral disc degeneration, lumbar region: Secondary | ICD-10-CM | POA: Diagnosis not present

## 2018-06-07 DIAGNOSIS — M069 Rheumatoid arthritis, unspecified: Secondary | ICD-10-CM | POA: Diagnosis not present

## 2018-06-07 DIAGNOSIS — M5136 Other intervertebral disc degeneration, lumbar region: Secondary | ICD-10-CM | POA: Diagnosis not present

## 2018-06-07 DIAGNOSIS — M19012 Primary osteoarthritis, left shoulder: Secondary | ICD-10-CM | POA: Diagnosis not present

## 2018-06-07 DIAGNOSIS — M1712 Unilateral primary osteoarthritis, left knee: Secondary | ICD-10-CM | POA: Diagnosis not present

## 2018-06-07 DIAGNOSIS — I69354 Hemiplegia and hemiparesis following cerebral infarction affecting left non-dominant side: Secondary | ICD-10-CM | POA: Diagnosis not present

## 2018-06-07 DIAGNOSIS — M5416 Radiculopathy, lumbar region: Secondary | ICD-10-CM | POA: Diagnosis not present

## 2018-06-10 ENCOUNTER — Other Ambulatory Visit: Payer: Self-pay | Admitting: Family Medicine

## 2018-06-10 NOTE — Telephone Encounter (Signed)
Copied from [place] [id number]. Topic: Quick Communication - Rx Refill/Question >> Jun 10, 2018  9:14 AM Robina Ade, Helene Kelp D wrote: Medication:  oxyCODONE-acetaminophen (PERCOCET/ROXICET) 5-325 MG tablet ,ALPRAZolam (XANAX) 0.25 MG tablet,apixaban (ELIQUIS) 2.5 MG TABS tablet  Has the patient contacted their pharmacy? Yes (Agent: If no, request that the patient contact the pharmacy for the refill.) (Agent: If yes, when and what did the pharmacy advise?)  Preferred Pharmacy (with phone number or street name): [facility] [place], [place]: Please be advised that RX refills may take up to 3 business days. We ask that you follow-up with your pharmacy.

## 2018-06-10 NOTE — Telephone Encounter (Signed)
Requested medication (s) are due for refill today: yes  Requested medication (s) are on the active medication list: yes    Last refill: 12/31/17  Future visit scheduled yes, tomorrow  Notes to clinic:Last refill by Dr. Burnice Logan  Requested Prescriptions  Pending Prescriptions Disp Refills   ALPRAZolam (XANAX) 0.25 MG tablet 90 tablet 0    Sig: Take 1 tablet (0.25 mg total) by mouth at bedtime.     Not Delegated - Psychiatry:  Anxiolytics/Hypnotics Failed - 06/10/2018  9:40 AM      Failed - This refill cannot be delegated      Failed - Urine Drug Screen completed in last 360 days.      Passed - Valid encounter within last 6 months    Recent Outpatient Visits          1 month ago Essential hypertension   [facility] Ethelene Hal, Mortimer Fries, MD   5 months ago Essential hypertension   [facility], Doretha Sou, MD   7 months ago Essential hypertension   [facility]rap[facility] at [facility], Doretha Sou, MD   9 months ago Respiratory illness   Therapist[facility] at [facility], [place], DO   9 months ago Cough   [facility] [place], Algis Greenhouse, MD      Future Appointments            Tomorrow Libby Maw, MD [facility], [place]   In 1 month Ethelene Hal, Mortimer Fries, MD [facility], [facility]          apixaban (ELIQUIS) 2.5 MG TABS tablet 60 tablet 6    Sig: Take 1 tablet (2.5 mg total) by mouth 2 (two) times daily.     Hematology:  Anticoagulants Failed - 06/10/2018  9:40 AM      Failed - Cr in normal range and within 360 days    Creat  Date Value Ref Range Status  10/03/2016 0.64 (L) 0.70 - 1.11 mg/dL Final    Comment:      For patients > or = 82 years of age: The upper reference limit for Creatinine is approximately 13% higher for people identified as African-American.      Creatinine, Ser  Date Value Ref Range Status  05/12/2018 0.53  (L) 0.61 - 1.24 mg/dL Final         Passed - HGB in normal range and within 360 days    Hemoglobin  Date Value Ref Range Status  05/12/2018 16.4 13.0 - 17.0 g/dL Final  04/23/2018 15.5 13.0 - 17.7 g/dL Final         Passed - PLT in normal range and within 360 days    Platelets  Date Value Ref Range Status  05/12/2018 226 150 - 400 K/uL Final  04/23/2018 246 150 - 450 x10E3/uL Final         Passed - HCT in normal range and within 360 days    HCT  Date Value Ref Range Status  05/12/2018 50.4 39.0 - 52.0 % Final   Hematocrit  Date Value Ref Range Status  04/23/2018 44.5 37.5 - 51.0 % Final         Passed - Valid encounter within last 12 months    Recent Outpatient Visits          1 month ago Essential hypertension   [facility] [place], Mortimer Fries, MD   5 months ago Essential hypertension  Therapist, music at [facility], Doretha Sou, MD   7 months ago Essential hypertension   Therapist, music at [facility], Doretha Sou, MD   9 months ago Respiratory illness   Therapist, music at [facility], [place], DO   9 months ago Cough   [employer] [facility] [place], MD      Future Appointments            Tomorrow Libby Maw, MD [facility] [place], [place]   In 1 month Ethelene Hal, Mortimer Fries, MD [facility], [facility]          HYDROcodone-acetaminophen (NORCO/VICODIN) 5-325 MG tablet 90 tablet 0    Sig: 1 tablet every 8-12 hours as needed for pain not to exceed 2 tablets in a 24-hour.     Not Delegated - Analgesics:  Opioid Agonist Combinations Failed - 06/10/2018  9:40 AM      Failed - This refill cannot be delegated      Failed - Urine Drug Screen completed in last 360 days.      Passed - Valid encounter within last 6 months    Recent Outpatient Visits          1 month ago Essential hypertension   [facility] Ethelene Hal, Mortimer Fries, MD   5 months ago Essential hypertension   Therapist, music at [facility], Doretha Sou, MD   7 months ago Essential hypertension   Therapist, music at [facility] [facility], Doretha Sou, MD   9 months ago Respiratory illness   Therapist, music at [employer], [place], DO   9 months ago Cough   [employer] [facility] [place], Algis Greenhouse, MD      Future Appointments            Tomorrow Ethelene Hal, Mortimer Fries, MD [facility] [place], [place]   In 1 month Ethelene Hal, Mortimer Fries, MD [facility], [facility]

## 2018-06-11 ENCOUNTER — Encounter: Payer: Self-pay | Admitting: Family Medicine

## 2018-06-11 ENCOUNTER — Ambulatory Visit (INDEPENDENT_AMBULATORY_CARE_PROVIDER_SITE_OTHER): Payer: Medicare Other | Admitting: Family Medicine

## 2018-06-11 VITALS — BP 110/68 | HR 89 | Temp 97.9°F | Ht 66.0 in | Wt 150.4 lb

## 2018-06-11 DIAGNOSIS — M1712 Unilateral primary osteoarthritis, left knee: Secondary | ICD-10-CM | POA: Diagnosis not present

## 2018-06-11 DIAGNOSIS — M5416 Radiculopathy, lumbar region: Secondary | ICD-10-CM | POA: Diagnosis not present

## 2018-06-11 DIAGNOSIS — M5136 Other intervertebral disc degeneration, lumbar region: Secondary | ICD-10-CM | POA: Diagnosis not present

## 2018-06-11 DIAGNOSIS — J069 Acute upper respiratory infection, unspecified: Secondary | ICD-10-CM | POA: Diagnosis not present

## 2018-06-11 DIAGNOSIS — F419 Anxiety disorder, unspecified: Secondary | ICD-10-CM

## 2018-06-11 DIAGNOSIS — M069 Rheumatoid arthritis, unspecified: Secondary | ICD-10-CM | POA: Diagnosis not present

## 2018-06-11 DIAGNOSIS — M19012 Primary osteoarthritis, left shoulder: Secondary | ICD-10-CM | POA: Diagnosis not present

## 2018-06-11 DIAGNOSIS — I69354 Hemiplegia and hemiparesis following cerebral infarction affecting left non-dominant side: Secondary | ICD-10-CM | POA: Diagnosis not present

## 2018-06-11 MED ORDER — DOXYCYCLINE HYCLATE 100 MG PO TABS: 100.0000 mg | ORAL_TABLET | Freq: Two times a day (BID) | ORAL | 0 refills | Status: DC

## 2018-06-11 MED ORDER — ALPRAZOLAM 0.25 MG PO TABS: 0.2500 mg | ORAL_TABLET | Freq: Every day | ORAL | 0 refills | Status: DC

## 2018-06-11 NOTE — Telephone Encounter (Signed)
Pt requested refill of hydrocodone and eliquis 06/10/18  LRFs 12/31/17 by Dr. Burnice Logan  Routed to [facility] in error and refused  Pt saw Dr. Ethelene Hal today 06/11/18.    [facility] [place], [place] - [address] [place] AT [facility] OF [place]

## 2018-06-11 NOTE — Progress Notes (Signed)
Subjective:  Patient ID: Brian [name], male    DOB: 1933-01-26  Age: 84 y.o. MRN: [id number]  CC: Cough (nasal congestion)   HPI Brian [name] presents for treatment and evaluation of a cough that has persisted for over a week.  There is been no fever chills or wheezing.  There is been some phlegm production.  He has had nasal congestion with postnasal drip.  He is having no facial pressure teeth pain.  Today he is accompanied by his son and caregiver.  With his son's help we reviewed his medication list.  He is taking low-dose Zoloft in the morning and 0.25 of Xanax at night only.  This is used to treat his anxiety.  He is not taking Lexapro.  He is not taking Benzapril or Benzapril HCTZ.  These were also removed from his medication list.  He is seeing orthopedics for his hydrocodone and Tylenol prescription.  He is confined to a wheelchair secondary to arthritis and a past medical history of a recent stroke.   Outpatient Medications Prior to Visit  Medication Sig Dispense Refill  . ANUCORT-HC 25 MG suppository UNWRAP AND INSERT 1 SUPPOSITORY RECTALLY TWICE DAILY 12 suppository 0  . apixaban (ELIQUIS) 2.5 MG TABS tablet Take 1 tablet (2.5 mg total) by mouth 2 (two) times daily. 60 tablet 6  . Catheters (GIZMO CONDOM CATHETER) MISC USE AS NEEDED 100 each 0  . cetirizine (ZYRTEC) 10 MG tablet TK 1 T PO QAM  2  . finasteride (PROSCAR) 5 MG tablet Take 1 tablet (5 mg total) by mouth daily. 90 tablet 3  . HYDROcodone-acetaminophen (NORCO/VICODIN) 5-325 MG tablet 1 tablet every 8-12 hours as needed for pain not to exceed 2 tablets in a 24-hour. 90 tablet 0  . hydrocortisone (ANUSOL-HC) 2.5 % rectal cream Place 1 application rectally 2 (two) times daily. 30 g 0  . hydrocortisone-pramoxine (ANALPRAM-HC) 2.5-1 % rectal cream hydrocortisone-pramoxine 2.5 %-1 % rectal cream  U REC TID    . ipratropium (ATROVENT) 0.06 % nasal spray ipratropium bromide 42 mcg (0.06 %) nasal spray    . loratadine  (CLARITIN) 10 MG tablet Take 10 mg by mouth daily.  1  . metoprolol succinate (TOPROL-XL) 25 MG 24 hr tablet TAKE 1 TABLET BY MOUTH EVERY MORNING AND TAKE 2 TABLETS BY MOUTH EVERY EVENING 270 tablet 3  . oxyCODONE-acetaminophen (PERCOCET/ROXICET) 5-325 MG tablet TK 1 T PO TID PRN  0  . pantoprazole (PROTONIX) 40 MG tablet Take 1 tablet (40 mg total) daily by mouth. 90 tablet 3  . pravastatin (PRAVACHOL) 40 MG tablet Take 1 tablet (40 mg total) by mouth daily. 90 tablet 3  . SANTYL ointment APP TO CLEANSED AFFECTED AREA ONCE D  2  . sertraline (ZOLOFT) 25 MG tablet Take 1 tablet (25 mg total) by mouth daily. 90 tablet 5  . ALPRAZolam (XANAX) 0.25 MG tablet Take 1 tablet (0.25 mg total) by mouth at bedtime. 90 tablet 0  . benazepril (LOTENSIN) 5 MG tablet Take 1 tablet (5 mg total) by mouth daily. 90 tablet 3  . benazepril-hydrochlorthiazide (LOTENSIN HCT) 5-6.25 MG tablet benazepril 5 mg-hydrochlorothiazide 6.25 mg tablet    . cephALEXin (KEFLEX) 500 MG capsule Take 1 capsule (500 mg total) by mouth 2 (two) times daily. 14 capsule 0  . escitalopram (LEXAPRO) 10 MG tablet escitalopram 10 mg tablet     No facility-administered medications prior to visit.     ROS Review of Systems  Constitutional: Negative for chills,  diaphoresis, fatigue, fever and unexpected weight change.  HENT: Positive for congestion and postnasal drip. Negative for rhinorrhea, sinus pressure, sinus pain and sore throat.   Eyes: Negative for photophobia and visual disturbance.  Respiratory: Positive for cough. Negative for shortness of breath and wheezing.   Cardiovascular: Negative.   Gastrointestinal: Negative.   Endocrine: Negative for polyphagia and polyuria.  Musculoskeletal: Positive for arthralgias.  Neurological: Negative for weakness, light-headedness and headaches.  Psychiatric/Behavioral: Negative for behavioral problems and dysphoric mood. The patient is nervous/anxious.     Objective:  BP 110/68   Pulse  89   Temp 97.9 F (36.6 C) (Oral)   Ht 5\' 6"  (1.676 m)   SpO2 90%   BMI 25.02 kg/m   BP Readings from Last 3 Encounters:  06/11/18 110/68  05/12/18 124/60  04/30/18 102/60    Wt Readings from Last 3 Encounters:  10/31/17 155 lb (70.3 kg)  09/06/17 144 lb (65.3 kg)  09/01/17 144 lb (65.3 kg)    Physical Exam  Constitutional: He is oriented to person, place, and time. He appears well-developed and well-nourished. No distress.  HENT:  Head: Normocephalic and atraumatic.  Right Ear: External ear normal.  Left Ear: External ear normal.  Eyes: Pupils are equal, round, and reactive to light. Conjunctivae and EOM are normal. Right eye exhibits no discharge. Left eye exhibits no discharge. No scleral icterus.  Neck: Neck supple. No JVD present. No tracheal deviation present. No thyromegaly present.  Cardiovascular: Normal rate, regular rhythm and normal heart sounds.  Pulmonary/Chest: Effort normal and breath sounds normal.  Neurological: He is alert and oriented to person, place, and time.  Skin: Skin is warm and dry. He is not diaphoretic.  Psychiatric: He has a normal mood and affect. His behavior is normal.    Lab Results  Component Value Date   WBC 8.0 05/12/2018   HGB 16.4 05/12/2018   HCT 50.4 05/12/2018   PLT 226 05/12/2018   GLUCOSE 92 05/12/2018   CHOL 134 04/16/2018   TRIG 139 04/16/2018   HDL 51 04/16/2018   LDLDIRECT 80.6 05/27/2009   LDLCALC 55 04/16/2018   ALT 24 05/12/2018   AST 25 05/12/2018   NA 135 05/12/2018   K 4.0 05/12/2018   CL 100 05/12/2018   CREATININE 0.53 (L) 05/12/2018   BUN 11 05/12/2018   CO2 24 05/12/2018   TSH 2.99 09/26/2016   PSA 1.92 06/03/2012   INR 1.28 05/08/2016   HGBA1C 5.2 12/31/2017    Dg Chest 2 View  Result Date: 05/12/2018 CLINICAL DATA:  Cough EXAM: CHEST - 2 VIEW COMPARISON:  03/27/2017 FINDINGS: Prior median sternotomy and valve replacement. Mild cardiomegaly. Left base atelectasis or scarring. Right lung clear.  No effusions or acute bony abnormality. IMPRESSION: Cardiomegaly.  Left base scarring or atelectasis. Electronically Signed   By: Brian [name] M.D.   On: 05/12/2018 17:56    Assessment & Plan:   Brian [name] was seen today for cough.  Diagnoses and all orders for this visit:  Anxiety -     ALPRAZolam (XANAX) 0.25 MG tablet; Take 1 tablet (0.25 mg total) by mouth at bedtime.  Upper respiratory tract infection, unspecified type -     doxycycline (VIBRA-TABS) 100 MG tablet; Take 1 tablet (100 mg total) by mouth 2 (two) times daily.   I have discontinued Youlanda Roys. Obeid's benazepril, benazepril-hydrochlorthiazide, escitalopram, and cephALEXin. I am also having him start on doxycycline. Additionally, I am having him maintain his pantoprazole, loratadine, GIZMO CONDOM  CATHETER, hydrocortisone, HYDROcodone-acetaminophen, apixaban, finasteride, metoprolol succinate, sertraline, pravastatin, cetirizine, SANTYL, hydrocortisone-pramoxine, ipratropium, oxyCODONE-acetaminophen, ANUCORT-HC, and ALPRAZolam.  Meds ordered this encounter  Medications  . doxycycline (VIBRA-TABS) 100 MG tablet    Sig: Take 1 tablet (100 mg total) by mouth 2 (two) times daily.    Dispense:  20 tablet    Refill:  0  . ALPRAZolam (XANAX) 0.25 MG tablet    Sig: Take 1 tablet (0.25 mg total) by mouth at bedtime.    Dispense:  90 tablet    Refill:  0   Patient will continue treatment for his anxiety continue with Zoloft in the morning and Xanax at night.  He will take 10 days of Doxy twice daily for his upper respiratory tract infection.  Follow-up: Return in about 3 months (around 09/11/2018), or if symptoms worsen or fail to improve.  Libby Maw, MD

## 2018-06-12 DIAGNOSIS — M1712 Unilateral primary osteoarthritis, left knee: Secondary | ICD-10-CM | POA: Diagnosis not present

## 2018-06-12 DIAGNOSIS — M069 Rheumatoid arthritis, unspecified: Secondary | ICD-10-CM | POA: Diagnosis not present

## 2018-06-12 DIAGNOSIS — M5416 Radiculopathy, lumbar region: Secondary | ICD-10-CM | POA: Diagnosis not present

## 2018-06-12 DIAGNOSIS — I69354 Hemiplegia and hemiparesis following cerebral infarction affecting left non-dominant side: Secondary | ICD-10-CM | POA: Diagnosis not present

## 2018-06-12 DIAGNOSIS — M19012 Primary osteoarthritis, left shoulder: Secondary | ICD-10-CM | POA: Diagnosis not present

## 2018-06-12 DIAGNOSIS — M5136 Other intervertebral disc degeneration, lumbar region: Secondary | ICD-10-CM | POA: Diagnosis not present

## 2018-06-13 ENCOUNTER — Other Ambulatory Visit: Payer: Self-pay | Admitting: Internal Medicine

## 2018-06-13 DIAGNOSIS — M1712 Unilateral primary osteoarthritis, left knee: Secondary | ICD-10-CM | POA: Diagnosis not present

## 2018-06-13 DIAGNOSIS — I69354 Hemiplegia and hemiparesis following cerebral infarction affecting left non-dominant side: Secondary | ICD-10-CM | POA: Diagnosis not present

## 2018-06-13 DIAGNOSIS — M19012 Primary osteoarthritis, left shoulder: Secondary | ICD-10-CM | POA: Diagnosis not present

## 2018-06-13 DIAGNOSIS — M069 Rheumatoid arthritis, unspecified: Secondary | ICD-10-CM | POA: Diagnosis not present

## 2018-06-13 DIAGNOSIS — M5416 Radiculopathy, lumbar region: Secondary | ICD-10-CM | POA: Diagnosis not present

## 2018-06-13 DIAGNOSIS — M5136 Other intervertebral disc degeneration, lumbar region: Secondary | ICD-10-CM | POA: Diagnosis not present

## 2018-06-14 ENCOUNTER — Telehealth: Payer: Self-pay | Admitting: Family Medicine

## 2018-06-14 ENCOUNTER — Other Ambulatory Visit: Payer: Self-pay

## 2018-06-14 MED ORDER — APIXABAN 2.5 MG PO TABS: 2.5000 mg | ORAL_TABLET | Freq: Two times a day (BID) | ORAL | 1 refills | Status: DC

## 2018-06-14 NOTE — Telephone Encounter (Signed)
Copied from [place] [id number]. Topic: Quick Communication - See Telephone Encounter >> Jun 14, 2018  9:22 AM Blase Mess A wrote: CRM for notification. See Telephone encounter for: 06/14/18.  Reuel Boom, Son, of Patient is calling to requesting some pain medicine.  He does not know the name of the medicine.  Please advise [phone]

## 2018-06-14 NOTE — Telephone Encounter (Signed)
I called and spoke with patient's son & made him aware that any pain medication will need to come from patient's Ortho doctor. Patient's son verbalized understanding & Eliquis refilled.

## 2018-06-14 NOTE — Telephone Encounter (Signed)
Copied from [place] [phone]. Topic: Quick Communication - See Telephone Encounter >> Jun 14, 2018  9:22 AM Blase Mess A wrote: CRM for notification. See Telephone encounter for: 06/14/18.  Reuel Boom, Son, of Patient is calling to requesting some pain medicine.  He does not know the name of the medicine.  Please advise [phone]

## 2018-06-17 ENCOUNTER — Other Ambulatory Visit: Payer: Self-pay | Admitting: Gastroenterology

## 2018-06-17 DIAGNOSIS — M1712 Unilateral primary osteoarthritis, left knee: Secondary | ICD-10-CM | POA: Diagnosis not present

## 2018-06-17 DIAGNOSIS — I69354 Hemiplegia and hemiparesis following cerebral infarction affecting left non-dominant side: Secondary | ICD-10-CM | POA: Diagnosis not present

## 2018-06-17 DIAGNOSIS — M069 Rheumatoid arthritis, unspecified: Secondary | ICD-10-CM | POA: Diagnosis not present

## 2018-06-17 DIAGNOSIS — M19012 Primary osteoarthritis, left shoulder: Secondary | ICD-10-CM | POA: Diagnosis not present

## 2018-06-17 DIAGNOSIS — M5416 Radiculopathy, lumbar region: Secondary | ICD-10-CM | POA: Diagnosis not present

## 2018-06-17 DIAGNOSIS — M5136 Other intervertebral disc degeneration, lumbar region: Secondary | ICD-10-CM | POA: Diagnosis not present

## 2018-06-18 DIAGNOSIS — M1712 Unilateral primary osteoarthritis, left knee: Secondary | ICD-10-CM | POA: Diagnosis not present

## 2018-06-18 DIAGNOSIS — M19012 Primary osteoarthritis, left shoulder: Secondary | ICD-10-CM | POA: Diagnosis not present

## 2018-06-18 DIAGNOSIS — Z23 Encounter for immunization: Secondary | ICD-10-CM | POA: Diagnosis not present

## 2018-06-18 DIAGNOSIS — I69354 Hemiplegia and hemiparesis following cerebral infarction affecting left non-dominant side: Secondary | ICD-10-CM | POA: Diagnosis not present

## 2018-06-18 DIAGNOSIS — M5416 Radiculopathy, lumbar region: Secondary | ICD-10-CM | POA: Diagnosis not present

## 2018-06-18 DIAGNOSIS — M069 Rheumatoid arthritis, unspecified: Secondary | ICD-10-CM | POA: Diagnosis not present

## 2018-06-18 DIAGNOSIS — M5136 Other intervertebral disc degeneration, lumbar region: Secondary | ICD-10-CM | POA: Diagnosis not present

## 2018-06-19 DIAGNOSIS — M069 Rheumatoid arthritis, unspecified: Secondary | ICD-10-CM | POA: Diagnosis not present

## 2018-06-19 DIAGNOSIS — M5416 Radiculopathy, lumbar region: Secondary | ICD-10-CM | POA: Diagnosis not present

## 2018-06-19 DIAGNOSIS — M19012 Primary osteoarthritis, left shoulder: Secondary | ICD-10-CM | POA: Diagnosis not present

## 2018-06-19 DIAGNOSIS — M1712 Unilateral primary osteoarthritis, left knee: Secondary | ICD-10-CM | POA: Diagnosis not present

## 2018-06-19 DIAGNOSIS — I69354 Hemiplegia and hemiparesis following cerebral infarction affecting left non-dominant side: Secondary | ICD-10-CM | POA: Diagnosis not present

## 2018-06-19 DIAGNOSIS — M5136 Other intervertebral disc degeneration, lumbar region: Secondary | ICD-10-CM | POA: Diagnosis not present

## 2018-06-20 DIAGNOSIS — M5136 Other intervertebral disc degeneration, lumbar region: Secondary | ICD-10-CM | POA: Diagnosis not present

## 2018-06-20 DIAGNOSIS — I69354 Hemiplegia and hemiparesis following cerebral infarction affecting left non-dominant side: Secondary | ICD-10-CM | POA: Diagnosis not present

## 2018-06-20 DIAGNOSIS — M19012 Primary osteoarthritis, left shoulder: Secondary | ICD-10-CM | POA: Diagnosis not present

## 2018-06-20 DIAGNOSIS — M1712 Unilateral primary osteoarthritis, left knee: Secondary | ICD-10-CM | POA: Diagnosis not present

## 2018-06-20 DIAGNOSIS — M069 Rheumatoid arthritis, unspecified: Secondary | ICD-10-CM | POA: Diagnosis not present

## 2018-06-20 DIAGNOSIS — M5416 Radiculopathy, lumbar region: Secondary | ICD-10-CM | POA: Diagnosis not present

## 2018-06-22 ENCOUNTER — Other Ambulatory Visit: Payer: Self-pay | Admitting: Internal Medicine

## 2018-06-22 DIAGNOSIS — M6281 Muscle weakness (generalized): Secondary | ICD-10-CM | POA: Diagnosis not present

## 2018-06-22 DIAGNOSIS — M25512 Pain in left shoulder: Secondary | ICD-10-CM | POA: Diagnosis not present

## 2018-06-22 DIAGNOSIS — Z7901 Long term (current) use of anticoagulants: Secondary | ICD-10-CM | POA: Diagnosis not present

## 2018-06-22 DIAGNOSIS — F329 Major depressive disorder, single episode, unspecified: Secondary | ICD-10-CM | POA: Diagnosis not present

## 2018-06-22 DIAGNOSIS — I4891 Unspecified atrial fibrillation: Secondary | ICD-10-CM | POA: Diagnosis not present

## 2018-06-22 DIAGNOSIS — F419 Anxiety disorder, unspecified: Secondary | ICD-10-CM | POA: Diagnosis not present

## 2018-06-24 ENCOUNTER — Telehealth: Payer: Self-pay | Admitting: Emergency Medicine

## 2018-06-24 DIAGNOSIS — M19012 Primary osteoarthritis, left shoulder: Secondary | ICD-10-CM | POA: Diagnosis not present

## 2018-06-24 DIAGNOSIS — M5416 Radiculopathy, lumbar region: Secondary | ICD-10-CM | POA: Diagnosis not present

## 2018-06-24 DIAGNOSIS — M5136 Other intervertebral disc degeneration, lumbar region: Secondary | ICD-10-CM | POA: Diagnosis not present

## 2018-06-24 DIAGNOSIS — M1712 Unilateral primary osteoarthritis, left knee: Secondary | ICD-10-CM | POA: Diagnosis not present

## 2018-06-24 DIAGNOSIS — I69354 Hemiplegia and hemiparesis following cerebral infarction affecting left non-dominant side: Secondary | ICD-10-CM | POA: Diagnosis not present

## 2018-06-24 DIAGNOSIS — M069 Rheumatoid arthritis, unspecified: Secondary | ICD-10-CM | POA: Diagnosis not present

## 2018-06-24 NOTE — Telephone Encounter (Signed)
Lost to followup 

## 2018-06-25 DIAGNOSIS — M1712 Unilateral primary osteoarthritis, left knee: Secondary | ICD-10-CM | POA: Diagnosis not present

## 2018-06-25 DIAGNOSIS — M19012 Primary osteoarthritis, left shoulder: Secondary | ICD-10-CM | POA: Diagnosis not present

## 2018-06-25 DIAGNOSIS — M5136 Other intervertebral disc degeneration, lumbar region: Secondary | ICD-10-CM | POA: Diagnosis not present

## 2018-06-25 DIAGNOSIS — M5416 Radiculopathy, lumbar region: Secondary | ICD-10-CM | POA: Diagnosis not present

## 2018-06-25 DIAGNOSIS — I69354 Hemiplegia and hemiparesis following cerebral infarction affecting left non-dominant side: Secondary | ICD-10-CM | POA: Diagnosis not present

## 2018-06-25 DIAGNOSIS — M069 Rheumatoid arthritis, unspecified: Secondary | ICD-10-CM | POA: Diagnosis not present

## 2018-06-26 ENCOUNTER — Telehealth: Payer: Self-pay | Admitting: Family Medicine

## 2018-06-26 DIAGNOSIS — I69354 Hemiplegia and hemiparesis following cerebral infarction affecting left non-dominant side: Secondary | ICD-10-CM | POA: Diagnosis not present

## 2018-06-26 DIAGNOSIS — M5136 Other intervertebral disc degeneration, lumbar region: Secondary | ICD-10-CM | POA: Diagnosis not present

## 2018-06-26 DIAGNOSIS — M19012 Primary osteoarthritis, left shoulder: Secondary | ICD-10-CM | POA: Diagnosis not present

## 2018-06-26 DIAGNOSIS — M5416 Radiculopathy, lumbar region: Secondary | ICD-10-CM | POA: Diagnosis not present

## 2018-06-26 DIAGNOSIS — M069 Rheumatoid arthritis, unspecified: Secondary | ICD-10-CM | POA: Diagnosis not present

## 2018-06-26 DIAGNOSIS — M1712 Unilateral primary osteoarthritis, left knee: Secondary | ICD-10-CM | POA: Diagnosis not present

## 2018-06-26 NOTE — Telephone Encounter (Signed)
Copied from [place] [id number] [phone]. Topic: Quick Communication - See Telephone Encounter >> Jun 26, 2018  3:34 PM Vernona Rieger wrote: CRM for notification. See Telephone encounter for: 06/26/18.  Patient's son, [name] called and states he would like a refill on doxycycline (VIBRA-TABS) 100 MG tablet. Patient's Son said the reason is that he has the sniffles and that he has been getting this since he was a patient of Dr Raliegh Ip. @ [facility]. [place], [address] AT [facility] OF [place] [phone]

## 2018-06-27 ENCOUNTER — Other Ambulatory Visit: Payer: Self-pay | Admitting: Internal Medicine

## 2018-06-27 ENCOUNTER — Other Ambulatory Visit: Payer: Self-pay | Admitting: Gastroenterology

## 2018-06-27 DIAGNOSIS — M1712 Unilateral primary osteoarthritis, left knee: Secondary | ICD-10-CM | POA: Diagnosis not present

## 2018-06-27 DIAGNOSIS — M19012 Primary osteoarthritis, left shoulder: Secondary | ICD-10-CM | POA: Diagnosis not present

## 2018-06-27 DIAGNOSIS — M5416 Radiculopathy, lumbar region: Secondary | ICD-10-CM | POA: Diagnosis not present

## 2018-06-27 DIAGNOSIS — M069 Rheumatoid arthritis, unspecified: Secondary | ICD-10-CM | POA: Diagnosis not present

## 2018-06-27 DIAGNOSIS — M5136 Other intervertebral disc degeneration, lumbar region: Secondary | ICD-10-CM | POA: Diagnosis not present

## 2018-06-27 DIAGNOSIS — I69354 Hemiplegia and hemiparesis following cerebral infarction affecting left non-dominant side: Secondary | ICD-10-CM | POA: Diagnosis not present

## 2018-06-27 NOTE — Telephone Encounter (Signed)
I called and spoke with patient's son. I advised him that the medication is not indicated for the sniffles or for chronic use & they can try some allergy medicine for patient's runny nose. Patient's son verbalized understanding.

## 2018-06-27 NOTE — Telephone Encounter (Signed)
Medication is not indicated for the sniffles or chronic use.

## 2018-07-01 ENCOUNTER — Telehealth: Payer: Self-pay | Admitting: Gastroenterology

## 2018-07-01 DIAGNOSIS — M069 Rheumatoid arthritis, unspecified: Secondary | ICD-10-CM | POA: Diagnosis not present

## 2018-07-01 DIAGNOSIS — I69354 Hemiplegia and hemiparesis following cerebral infarction affecting left non-dominant side: Secondary | ICD-10-CM | POA: Diagnosis not present

## 2018-07-01 DIAGNOSIS — M1712 Unilateral primary osteoarthritis, left knee: Secondary | ICD-10-CM | POA: Diagnosis not present

## 2018-07-01 DIAGNOSIS — M5136 Other intervertebral disc degeneration, lumbar region: Secondary | ICD-10-CM | POA: Diagnosis not present

## 2018-07-01 DIAGNOSIS — M5416 Radiculopathy, lumbar region: Secondary | ICD-10-CM | POA: Diagnosis not present

## 2018-07-01 DIAGNOSIS — M19012 Primary osteoarthritis, left shoulder: Secondary | ICD-10-CM | POA: Diagnosis not present

## 2018-07-01 MED ORDER — HYDROCORTISONE ACETATE 25 MG RE SUPP: RECTAL | 0 refills | Status: DC

## 2018-07-01 MED ORDER — HYDROCORTISONE 2.5 % RE CREA: 1.0000 "application " | TOPICAL_CREAM | Freq: Two times a day (BID) | RECTAL | 0 refills | Status: DC

## 2018-07-01 NOTE — Telephone Encounter (Signed)
Prescription sent to patient's pharmacy and notified to keep appt for further refills.

## 2018-07-02 DIAGNOSIS — M5416 Radiculopathy, lumbar region: Secondary | ICD-10-CM | POA: Diagnosis not present

## 2018-07-02 DIAGNOSIS — M5136 Other intervertebral disc degeneration, lumbar region: Secondary | ICD-10-CM | POA: Diagnosis not present

## 2018-07-02 DIAGNOSIS — I69354 Hemiplegia and hemiparesis following cerebral infarction affecting left non-dominant side: Secondary | ICD-10-CM | POA: Diagnosis not present

## 2018-07-02 DIAGNOSIS — M069 Rheumatoid arthritis, unspecified: Secondary | ICD-10-CM | POA: Diagnosis not present

## 2018-07-02 DIAGNOSIS — M1712 Unilateral primary osteoarthritis, left knee: Secondary | ICD-10-CM | POA: Diagnosis not present

## 2018-07-02 DIAGNOSIS — M19012 Primary osteoarthritis, left shoulder: Secondary | ICD-10-CM | POA: Diagnosis not present

## 2018-07-04 DIAGNOSIS — M069 Rheumatoid arthritis, unspecified: Secondary | ICD-10-CM | POA: Diagnosis not present

## 2018-07-04 DIAGNOSIS — M19012 Primary osteoarthritis, left shoulder: Secondary | ICD-10-CM | POA: Diagnosis not present

## 2018-07-04 DIAGNOSIS — M5136 Other intervertebral disc degeneration, lumbar region: Secondary | ICD-10-CM | POA: Diagnosis not present

## 2018-07-04 DIAGNOSIS — M1712 Unilateral primary osteoarthritis, left knee: Secondary | ICD-10-CM | POA: Diagnosis not present

## 2018-07-04 DIAGNOSIS — I69354 Hemiplegia and hemiparesis following cerebral infarction affecting left non-dominant side: Secondary | ICD-10-CM | POA: Diagnosis not present

## 2018-07-04 DIAGNOSIS — M5416 Radiculopathy, lumbar region: Secondary | ICD-10-CM | POA: Diagnosis not present

## 2018-07-05 DIAGNOSIS — I69354 Hemiplegia and hemiparesis following cerebral infarction affecting left non-dominant side: Secondary | ICD-10-CM | POA: Diagnosis not present

## 2018-07-05 DIAGNOSIS — M19012 Primary osteoarthritis, left shoulder: Secondary | ICD-10-CM | POA: Diagnosis not present

## 2018-07-05 DIAGNOSIS — M5416 Radiculopathy, lumbar region: Secondary | ICD-10-CM | POA: Diagnosis not present

## 2018-07-05 DIAGNOSIS — M1712 Unilateral primary osteoarthritis, left knee: Secondary | ICD-10-CM | POA: Diagnosis not present

## 2018-07-05 DIAGNOSIS — M5136 Other intervertebral disc degeneration, lumbar region: Secondary | ICD-10-CM | POA: Diagnosis not present

## 2018-07-05 DIAGNOSIS — M069 Rheumatoid arthritis, unspecified: Secondary | ICD-10-CM | POA: Diagnosis not present

## 2018-07-09 DIAGNOSIS — M5136 Other intervertebral disc degeneration, lumbar region: Secondary | ICD-10-CM | POA: Diagnosis not present

## 2018-07-09 DIAGNOSIS — M1712 Unilateral primary osteoarthritis, left knee: Secondary | ICD-10-CM | POA: Diagnosis not present

## 2018-07-09 DIAGNOSIS — M19012 Primary osteoarthritis, left shoulder: Secondary | ICD-10-CM | POA: Diagnosis not present

## 2018-07-09 DIAGNOSIS — M069 Rheumatoid arthritis, unspecified: Secondary | ICD-10-CM | POA: Diagnosis not present

## 2018-07-09 DIAGNOSIS — M5416 Radiculopathy, lumbar region: Secondary | ICD-10-CM | POA: Diagnosis not present

## 2018-07-09 DIAGNOSIS — I69354 Hemiplegia and hemiparesis following cerebral infarction affecting left non-dominant side: Secondary | ICD-10-CM | POA: Diagnosis not present

## 2018-07-11 DIAGNOSIS — M1712 Unilateral primary osteoarthritis, left knee: Secondary | ICD-10-CM | POA: Diagnosis not present

## 2018-07-11 DIAGNOSIS — I69354 Hemiplegia and hemiparesis following cerebral infarction affecting left non-dominant side: Secondary | ICD-10-CM | POA: Diagnosis not present

## 2018-07-11 DIAGNOSIS — M5136 Other intervertebral disc degeneration, lumbar region: Secondary | ICD-10-CM | POA: Diagnosis not present

## 2018-07-11 DIAGNOSIS — M5416 Radiculopathy, lumbar region: Secondary | ICD-10-CM | POA: Diagnosis not present

## 2018-07-11 DIAGNOSIS — M069 Rheumatoid arthritis, unspecified: Secondary | ICD-10-CM | POA: Diagnosis not present

## 2018-07-11 DIAGNOSIS — M19012 Primary osteoarthritis, left shoulder: Secondary | ICD-10-CM | POA: Diagnosis not present

## 2018-07-15 DIAGNOSIS — I69354 Hemiplegia and hemiparesis following cerebral infarction affecting left non-dominant side: Secondary | ICD-10-CM | POA: Diagnosis not present

## 2018-07-15 DIAGNOSIS — M1712 Unilateral primary osteoarthritis, left knee: Secondary | ICD-10-CM | POA: Diagnosis not present

## 2018-07-15 DIAGNOSIS — M5416 Radiculopathy, lumbar region: Secondary | ICD-10-CM | POA: Diagnosis not present

## 2018-07-15 DIAGNOSIS — M19012 Primary osteoarthritis, left shoulder: Secondary | ICD-10-CM | POA: Diagnosis not present

## 2018-07-15 DIAGNOSIS — M5136 Other intervertebral disc degeneration, lumbar region: Secondary | ICD-10-CM | POA: Diagnosis not present

## 2018-07-15 DIAGNOSIS — M069 Rheumatoid arthritis, unspecified: Secondary | ICD-10-CM | POA: Diagnosis not present

## 2018-07-16 DIAGNOSIS — M1712 Unilateral primary osteoarthritis, left knee: Secondary | ICD-10-CM | POA: Diagnosis not present

## 2018-07-16 DIAGNOSIS — M5136 Other intervertebral disc degeneration, lumbar region: Secondary | ICD-10-CM | POA: Diagnosis not present

## 2018-07-16 DIAGNOSIS — I69354 Hemiplegia and hemiparesis following cerebral infarction affecting left non-dominant side: Secondary | ICD-10-CM | POA: Diagnosis not present

## 2018-07-16 DIAGNOSIS — M5416 Radiculopathy, lumbar region: Secondary | ICD-10-CM | POA: Diagnosis not present

## 2018-07-16 DIAGNOSIS — M19012 Primary osteoarthritis, left shoulder: Secondary | ICD-10-CM | POA: Diagnosis not present

## 2018-07-16 DIAGNOSIS — M069 Rheumatoid arthritis, unspecified: Secondary | ICD-10-CM | POA: Diagnosis not present

## 2018-07-17 DIAGNOSIS — M1712 Unilateral primary osteoarthritis, left knee: Secondary | ICD-10-CM | POA: Diagnosis not present

## 2018-07-17 DIAGNOSIS — M5136 Other intervertebral disc degeneration, lumbar region: Secondary | ICD-10-CM | POA: Diagnosis not present

## 2018-07-17 DIAGNOSIS — M5416 Radiculopathy, lumbar region: Secondary | ICD-10-CM | POA: Diagnosis not present

## 2018-07-17 DIAGNOSIS — M069 Rheumatoid arthritis, unspecified: Secondary | ICD-10-CM | POA: Diagnosis not present

## 2018-07-17 DIAGNOSIS — M19012 Primary osteoarthritis, left shoulder: Secondary | ICD-10-CM | POA: Diagnosis not present

## 2018-07-17 DIAGNOSIS — I69354 Hemiplegia and hemiparesis following cerebral infarction affecting left non-dominant side: Secondary | ICD-10-CM | POA: Diagnosis not present

## 2018-07-19 DIAGNOSIS — M069 Rheumatoid arthritis, unspecified: Secondary | ICD-10-CM | POA: Diagnosis not present

## 2018-07-19 DIAGNOSIS — M5136 Other intervertebral disc degeneration, lumbar region: Secondary | ICD-10-CM | POA: Diagnosis not present

## 2018-07-19 DIAGNOSIS — M1712 Unilateral primary osteoarthritis, left knee: Secondary | ICD-10-CM | POA: Diagnosis not present

## 2018-07-19 DIAGNOSIS — M5416 Radiculopathy, lumbar region: Secondary | ICD-10-CM | POA: Diagnosis not present

## 2018-07-19 DIAGNOSIS — M19012 Primary osteoarthritis, left shoulder: Secondary | ICD-10-CM | POA: Diagnosis not present

## 2018-07-19 DIAGNOSIS — I69354 Hemiplegia and hemiparesis following cerebral infarction affecting left non-dominant side: Secondary | ICD-10-CM | POA: Diagnosis not present

## 2018-07-22 DIAGNOSIS — M1712 Unilateral primary osteoarthritis, left knee: Secondary | ICD-10-CM | POA: Diagnosis not present

## 2018-07-22 DIAGNOSIS — M069 Rheumatoid arthritis, unspecified: Secondary | ICD-10-CM | POA: Diagnosis not present

## 2018-07-22 DIAGNOSIS — I69354 Hemiplegia and hemiparesis following cerebral infarction affecting left non-dominant side: Secondary | ICD-10-CM | POA: Diagnosis not present

## 2018-07-22 DIAGNOSIS — M5416 Radiculopathy, lumbar region: Secondary | ICD-10-CM | POA: Diagnosis not present

## 2018-07-22 DIAGNOSIS — M19012 Primary osteoarthritis, left shoulder: Secondary | ICD-10-CM | POA: Diagnosis not present

## 2018-07-22 DIAGNOSIS — M5136 Other intervertebral disc degeneration, lumbar region: Secondary | ICD-10-CM | POA: Diagnosis not present

## 2018-07-23 ENCOUNTER — Other Ambulatory Visit: Payer: Self-pay | Admitting: Family Medicine

## 2018-07-23 DIAGNOSIS — M1712 Unilateral primary osteoarthritis, left knee: Secondary | ICD-10-CM | POA: Diagnosis not present

## 2018-07-23 DIAGNOSIS — M5136 Other intervertebral disc degeneration, lumbar region: Secondary | ICD-10-CM | POA: Diagnosis not present

## 2018-07-23 DIAGNOSIS — I69354 Hemiplegia and hemiparesis following cerebral infarction affecting left non-dominant side: Secondary | ICD-10-CM | POA: Diagnosis not present

## 2018-07-23 DIAGNOSIS — J069 Acute upper respiratory infection, unspecified: Secondary | ICD-10-CM

## 2018-07-23 DIAGNOSIS — M19012 Primary osteoarthritis, left shoulder: Secondary | ICD-10-CM | POA: Diagnosis not present

## 2018-07-23 DIAGNOSIS — M069 Rheumatoid arthritis, unspecified: Secondary | ICD-10-CM | POA: Diagnosis not present

## 2018-07-23 DIAGNOSIS — M5416 Radiculopathy, lumbar region: Secondary | ICD-10-CM | POA: Diagnosis not present

## 2018-07-24 DIAGNOSIS — M5136 Other intervertebral disc degeneration, lumbar region: Secondary | ICD-10-CM | POA: Diagnosis not present

## 2018-07-24 DIAGNOSIS — M5416 Radiculopathy, lumbar region: Secondary | ICD-10-CM | POA: Diagnosis not present

## 2018-07-24 DIAGNOSIS — M19012 Primary osteoarthritis, left shoulder: Secondary | ICD-10-CM | POA: Diagnosis not present

## 2018-07-24 DIAGNOSIS — M1712 Unilateral primary osteoarthritis, left knee: Secondary | ICD-10-CM | POA: Diagnosis not present

## 2018-07-24 DIAGNOSIS — I69354 Hemiplegia and hemiparesis following cerebral infarction affecting left non-dominant side: Secondary | ICD-10-CM | POA: Diagnosis not present

## 2018-07-24 DIAGNOSIS — M069 Rheumatoid arthritis, unspecified: Secondary | ICD-10-CM | POA: Diagnosis not present

## 2018-07-26 DIAGNOSIS — M1712 Unilateral primary osteoarthritis, left knee: Secondary | ICD-10-CM | POA: Diagnosis not present

## 2018-07-26 DIAGNOSIS — M5416 Radiculopathy, lumbar region: Secondary | ICD-10-CM | POA: Diagnosis not present

## 2018-07-26 DIAGNOSIS — I69354 Hemiplegia and hemiparesis following cerebral infarction affecting left non-dominant side: Secondary | ICD-10-CM | POA: Diagnosis not present

## 2018-07-26 DIAGNOSIS — M5136 Other intervertebral disc degeneration, lumbar region: Secondary | ICD-10-CM | POA: Diagnosis not present

## 2018-07-26 DIAGNOSIS — M069 Rheumatoid arthritis, unspecified: Secondary | ICD-10-CM | POA: Diagnosis not present

## 2018-07-26 DIAGNOSIS — M19012 Primary osteoarthritis, left shoulder: Secondary | ICD-10-CM | POA: Diagnosis not present

## 2018-07-28 ENCOUNTER — Other Ambulatory Visit: Payer: Self-pay | Admitting: Gastroenterology

## 2018-07-28 ENCOUNTER — Other Ambulatory Visit: Payer: Self-pay | Admitting: Family Medicine

## 2018-07-28 DIAGNOSIS — J069 Acute upper respiratory infection, unspecified: Secondary | ICD-10-CM

## 2018-07-30 ENCOUNTER — Ambulatory Visit: Payer: Medicare Other | Admitting: Family Medicine

## 2018-07-30 DIAGNOSIS — I69354 Hemiplegia and hemiparesis following cerebral infarction affecting left non-dominant side: Secondary | ICD-10-CM | POA: Diagnosis not present

## 2018-07-30 DIAGNOSIS — M5136 Other intervertebral disc degeneration, lumbar region: Secondary | ICD-10-CM | POA: Diagnosis not present

## 2018-07-30 DIAGNOSIS — M1712 Unilateral primary osteoarthritis, left knee: Secondary | ICD-10-CM | POA: Diagnosis not present

## 2018-07-30 DIAGNOSIS — M19012 Primary osteoarthritis, left shoulder: Secondary | ICD-10-CM | POA: Diagnosis not present

## 2018-07-30 DIAGNOSIS — M5416 Radiculopathy, lumbar region: Secondary | ICD-10-CM | POA: Diagnosis not present

## 2018-07-30 DIAGNOSIS — M069 Rheumatoid arthritis, unspecified: Secondary | ICD-10-CM | POA: Diagnosis not present

## 2018-08-06 ENCOUNTER — Other Ambulatory Visit: Payer: Self-pay | Admitting: Gastroenterology

## 2018-08-06 DIAGNOSIS — M25512 Pain in left shoulder: Secondary | ICD-10-CM | POA: Diagnosis not present

## 2018-08-06 DIAGNOSIS — M25562 Pain in left knee: Secondary | ICD-10-CM | POA: Diagnosis not present

## 2018-08-06 DIAGNOSIS — G894 Chronic pain syndrome: Secondary | ICD-10-CM | POA: Diagnosis not present

## 2018-08-06 DIAGNOSIS — M5136 Other intervertebral disc degeneration, lumbar region: Secondary | ICD-10-CM | POA: Diagnosis not present

## 2018-08-10 ENCOUNTER — Other Ambulatory Visit: Payer: Self-pay | Admitting: Internal Medicine

## 2018-08-10 DIAGNOSIS — I10 Essential (primary) hypertension: Secondary | ICD-10-CM | POA: Diagnosis not present

## 2018-08-10 DIAGNOSIS — J019 Acute sinusitis, unspecified: Secondary | ICD-10-CM | POA: Diagnosis not present

## 2018-08-10 DIAGNOSIS — L89159 Pressure ulcer of sacral region, unspecified stage: Secondary | ICD-10-CM | POA: Diagnosis not present

## 2018-08-10 DIAGNOSIS — Z7901 Long term (current) use of anticoagulants: Secondary | ICD-10-CM | POA: Diagnosis not present

## 2018-08-10 DIAGNOSIS — I4891 Unspecified atrial fibrillation: Secondary | ICD-10-CM | POA: Diagnosis not present

## 2018-08-10 DIAGNOSIS — M6281 Muscle weakness (generalized): Secondary | ICD-10-CM | POA: Diagnosis not present

## 2018-08-11 ENCOUNTER — Other Ambulatory Visit: Payer: Self-pay | Admitting: Gastroenterology

## 2018-08-27 ENCOUNTER — Other Ambulatory Visit: Payer: Self-pay | Admitting: Gastroenterology

## 2018-08-27 NOTE — Telephone Encounter (Signed)
Patient requesting refill of hydrocortisone sent to [facility]. Pt last seen 05.2019.

## 2018-09-05 ENCOUNTER — Other Ambulatory Visit: Payer: Self-pay | Admitting: Gastroenterology

## 2018-09-10 ENCOUNTER — Ambulatory Visit: Payer: Medicare Other | Admitting: Family Medicine

## 2018-09-11 DIAGNOSIS — I6930 Unspecified sequelae of cerebral infarction: Secondary | ICD-10-CM | POA: Diagnosis not present

## 2018-09-11 DIAGNOSIS — Z7901 Long term (current) use of anticoagulants: Secondary | ICD-10-CM | POA: Diagnosis not present

## 2018-09-11 DIAGNOSIS — H9209 Otalgia, unspecified ear: Secondary | ICD-10-CM | POA: Diagnosis not present

## 2018-09-11 DIAGNOSIS — I4891 Unspecified atrial fibrillation: Secondary | ICD-10-CM | POA: Diagnosis not present

## 2018-09-11 DIAGNOSIS — E785 Hyperlipidemia, unspecified: Secondary | ICD-10-CM | POA: Diagnosis not present

## 2018-09-11 DIAGNOSIS — G8190 Hemiplegia, unspecified affecting unspecified side: Secondary | ICD-10-CM | POA: Diagnosis not present

## 2018-09-11 DIAGNOSIS — I10 Essential (primary) hypertension: Secondary | ICD-10-CM | POA: Diagnosis not present

## 2018-09-12 ENCOUNTER — Ambulatory Visit: Payer: Medicare Other | Admitting: Family Medicine

## 2018-09-15 ENCOUNTER — Other Ambulatory Visit: Payer: Self-pay | Admitting: Gastroenterology

## 2018-09-18 ENCOUNTER — Ambulatory Visit: Payer: Medicare Other | Admitting: Physician Assistant

## 2018-09-19 ENCOUNTER — Other Ambulatory Visit: Payer: Self-pay | Admitting: Internal Medicine

## 2018-09-20 ENCOUNTER — Other Ambulatory Visit: Payer: Self-pay | Admitting: Family Medicine

## 2018-09-20 MED ORDER — PANTOPRAZOLE SODIUM 40 MG PO TBEC: 40.0000 mg | DELAYED_RELEASE_TABLET | Freq: Every day | ORAL | 3 refills | Status: DC

## 2018-09-20 NOTE — Telephone Encounter (Signed)
Copied from [place] [id number]. Topic: Quick Communication - Rx Refill/Question >> Sep 20, 2018  4:24 PM Blase Mess A wrote: Medication: pantoprazole (PROTONIX) 40 MG tablet [[id number]]   Has the patient contacted their pharmacy? Yes  (Agent: If no, request that the patient contact the pharmacy for the refill.) (Agent: If yes, when and what did the pharmacy advise?)  Preferred Pharmacy (with phone number or street name): [facility] [place] [place], [place] - [address] AT [place] [phone] (Phone) [phone] (Fax)    Agent: Please be advised that RX refills may take up to 3 business days. We ask that you follow-up with your pharmacy.

## 2018-09-20 NOTE — Telephone Encounter (Signed)
Rx sent 

## 2018-09-24 ENCOUNTER — Encounter: Payer: Self-pay | Admitting: Family Medicine

## 2018-09-24 ENCOUNTER — Ambulatory Visit (INDEPENDENT_AMBULATORY_CARE_PROVIDER_SITE_OTHER): Payer: Medicare Other | Admitting: Family Medicine

## 2018-09-24 VITALS — BP 120/70 | HR 95 | Ht 66.0 in | Wt 143.0 lb

## 2018-09-24 DIAGNOSIS — R269 Unspecified abnormalities of gait and mobility: Secondary | ICD-10-CM | POA: Insufficient documentation

## 2018-09-24 DIAGNOSIS — I10 Essential (primary) hypertension: Secondary | ICD-10-CM | POA: Diagnosis not present

## 2018-09-24 DIAGNOSIS — F419 Anxiety disorder, unspecified: Secondary | ICD-10-CM | POA: Diagnosis not present

## 2018-09-24 DIAGNOSIS — I480 Paroxysmal atrial fibrillation: Secondary | ICD-10-CM | POA: Diagnosis not present

## 2018-09-24 DIAGNOSIS — E7800 Pure hypercholesterolemia, unspecified: Secondary | ICD-10-CM

## 2018-09-24 DIAGNOSIS — E782 Mixed hyperlipidemia: Secondary | ICD-10-CM | POA: Insufficient documentation

## 2018-09-24 DIAGNOSIS — E785 Hyperlipidemia, unspecified: Secondary | ICD-10-CM | POA: Insufficient documentation

## 2018-09-24 LAB — BASIC METABOLIC PANEL
BUN: 17 mg/dL (ref 6–23)
CALCIUM: 9.3 mg/dL (ref 8.4–10.5)
CHLORIDE: 101 meq/L (ref 96–112)
CO2: 32 meq/L (ref 19–32)
Creatinine, Ser: 0.53 mg/dL (ref 0.40–1.50)
GFR: 148.22 mL/min (ref >60.00)
Glucose, Bld: 93 mg/dL (ref 70–99)
Potassium: 5.3 mEq/L — ABNORMAL HIGH (ref 3.5–5.1)
SODIUM: 139 meq/L (ref 135–145)

## 2018-09-24 LAB — CBC
HCT: 43.8 % (ref 39.0–52.0)
Hemoglobin: 15 g/dL (ref 13.0–17.0)
MCHC: 34.2 g/dL (ref 30.0–36.0)
MCV: 95.2 fl (ref 78.0–100.0)
Platelets: 233 10*3/uL (ref 150.0–400.0)
RBC: 4.6 Mil/uL (ref 4.22–5.81)
RDW: 14.2 % (ref 11.5–15.5)
WBC: 7.3 10*3/uL (ref 4.0–10.5)

## 2018-09-24 LAB — LDL CHOLESTEROL, DIRECT: LDL DIRECT: 57 mg/dL

## 2018-09-24 MED ORDER — ALPRAZOLAM 0.25 MG PO TABS: 0.2500 mg | ORAL_TABLET | Freq: Every day | ORAL | 0 refills | Status: DC

## 2018-09-24 MED ORDER — SERTRALINE HCL 50 MG PO TABS: 50.0000 mg | ORAL_TABLET | Freq: Every day | ORAL | 3 refills | Status: DC

## 2018-09-24 MED ORDER — PRAVASTATIN SODIUM 40 MG PO TABS: 40.0000 mg | ORAL_TABLET | Freq: Every day | ORAL | 3 refills | Status: DC

## 2018-09-24 MED ORDER — APIXABAN 2.5 MG PO TABS: 2.5000 mg | ORAL_TABLET | Freq: Two times a day (BID) | ORAL | 1 refills | Status: DC

## 2018-09-24 MED ORDER — METOPROLOL SUCCINATE ER 25 MG PO TB24: ORAL_TABLET | ORAL | 3 refills | Status: DC

## 2018-09-24 NOTE — Progress Notes (Signed)
Established Patient Office Visit  Subjective:  Patient ID: Brian [name], male    DOB: [birth year]  Age: 85 y.o. MRN: [id number]  CC:  Chief Complaint  Patient presents with  . Follow-up    HPI Brian [name] presents for follow-up of his anxiety, hypertension paroxysmal atrial fibrillation elevated cholesterol and general weakness.  He is accompanied by his aide only today.  Patient expresses worry about his weight and his 39 year old dog.  He is taking low-dose Zoloft and Xanax p.m.  Blood pressure controlled with low-dose metoprolol.  Patient denies chest pain skipped beats or shortness of breath.  Pain management per Ortho.  A tells me that patient needs help with transfers and toileting.  Physical therapy has helped in the past.  Past Medical History:  Diagnosis Date  . Anemia   . Atrial flutter ([place])   . BPH (benign prostatic hypertrophy)   . Epistaxis   . Hemorrhage of gastrointestinal tract, unspecified   . History of open heart surgery   . Hypertension   . Osteoarthrosis, unspecified whether generalized or localized, unspecified site   . Personal history of venous thrombosis and embolism   . Rosacea   . Stroke ([place])   . TIA (transient ischemic attack)     Past Surgical History:  Procedure Laterality Date  . APPENDECTOMY    . HERNIA REPAIR    . IR GENERIC HISTORICAL  05/04/2016   IR PERCUTANEOUS ART THROMBECTOMY/INFUSION INTRACRANIAL INC DIAG ANGIO 05/04/2016 [name], MD [facility]  . IR GENERIC HISTORICAL  05/04/2016   IR ANGIO VERTEBRAL SEL SUBCLAVIAN INNOMINATE UNI R MOD SED 05/04/2016 [name], MD [facility]  . IR GENERIC HISTORICAL  06/07/2016   IR RADIOLOGIST EVAL & MGMT 06/07/2016 [facility]  . JOINT REPLACEMENT    . MITRAL VALVE REPAIR    . mvp repair    . RADIOLOGY WITH ANESTHESIA N/A 05/04/2016   Procedure: RADIOLOGY WITH ANESTHESIA;  Surgeon: [name], MD;  Location: [place];  Service: Radiology;  Laterality: N/A;  .  TOTAL HIP ARTHROPLASTY      Family History  Problem Relation Age of Onset  . Rheumatic fever Mother   . Coronary artery disease Father     Social History   Socioeconomic History  . Marital status: Married    Spouse name: Not on file  . Number of children: Not on file  . Years of education: Not on file  . Highest education level: Not on file  Occupational History  . Not on file  Social Needs  . Financial resource strain: Not on file  . Food insecurity:    Worry: Not on file    Inability: Not on file  . Transportation needs:    Medical: Not on file    Non-medical: Not on file  Tobacco Use  . Smoking status: Former Research scientist (life sciences)  . Smokeless tobacco: Never Used  . Tobacco comment: quit 50 yr ago  Substance and Sexual Activity  . Alcohol use: No  . Drug use: No  . Sexual activity: Not on file  Lifestyle  . Physical activity:    Days per week: Not on file    Minutes per session: Not on file  . Stress: Not on file  Relationships  . Social connections:    Talks on phone: Not on file    Gets together: Not on file    Attends religious service: Not on file    Active member of club or organization: Not on  file    Attends meetings of clubs or organizations: Not on file    Relationship status: Not on file  . Intimate partner violence:    Fear of current or ex partner: Not on file    Emotionally abused: Not on file    Physically abused: Not on file    Forced sexual activity: Not on file  Other Topics Concern  . Not on file  Social History Narrative  . Not on file    Outpatient Medications Prior to Visit  Medication Sig Dispense Refill  . Catheters (GIZMO CONDOM CATHETER) MISC USE AS NEEDED 100 each 0  . cetirizine (ZYRTEC) 10 MG tablet TK 1 T PO QAM  2  . doxycycline (VIBRA-TABS) 100 MG tablet Take 1 tablet (100 mg total) by mouth 2 (two) times daily. 20 tablet 0  . finasteride (PROSCAR) 5 MG tablet Take 1 tablet (5 mg total) by mouth daily. 90 tablet 3  .  HYDROcodone-acetaminophen (NORCO/VICODIN) 5-325 MG tablet 1 tablet every 8-12 hours as needed for pain not to exceed 2 tablets in a 24-hour. 90 tablet 0  . hydrocortisone (ANUCORT-HC) 25 MG suppository UNWRAP AND INSERT 1 SUPPOSITORY RECTALLY TWICE DAILY 12 suppository 0  . hydrocortisone-pramoxine (ANALPRAM-HC) 2.5-1 % rectal cream hydrocortisone-pramoxine 2.5 %-1 % rectal cream  U REC TID    . ipratropium (ATROVENT) 0.06 % nasal spray ipratropium bromide 42 mcg (0.06 %) nasal spray    . loratadine (CLARITIN) 10 MG tablet Take 10 mg by mouth daily.  1  . oxyCODONE-acetaminophen (PERCOCET/ROXICET) 5-325 MG tablet TK 1 T PO TID PRN  0  . pantoprazole (PROTONIX) 40 MG tablet Take 1 tablet (40 mg total) by mouth daily. 90 tablet 3  . PROCTOZONE-HC 2.5 % rectal cream USE RECTALLY TWICE DAILY 30 g 0  . SANTYL ointment APP TO CLEANSED AFFECTED AREA ONCE D  2  . ALPRAZolam (XANAX) 0.25 MG tablet Take 1 tablet (0.25 mg total) by mouth at bedtime. 90 tablet 0  . apixaban (ELIQUIS) 2.5 MG TABS tablet Take 1 tablet (2.5 mg total) by mouth 2 (two) times daily. 180 tablet 1  . metoprolol succinate (TOPROL-XL) 25 MG 24 hr tablet TAKE 1 TABLET BY MOUTH EVERY MORNING AND TAKE 2 TABLETS BY MOUTH EVERY EVENING 270 tablet 3  . pravastatin (PRAVACHOL) 40 MG tablet Take 1 tablet (40 mg total) by mouth daily. 90 tablet 3  . sertraline (ZOLOFT) 25 MG tablet Take 1 tablet (25 mg total) by mouth daily. 90 tablet 5   No facility-administered medications prior to visit.     Allergies  Allergen Reactions  . Novocain [Procaine Hcl]     Irregular heart beat     . Other     Patient had problem with epidural with his right hip surgery. Went to up extremity instead of lower extremity   . Penicillins Itching    Has patient had a PCN reaction causing immediate rash, facial/tongue/throat swelling, SOB or lightheadedness with hypotension: Unk Has patient had a PCN reaction causing severe rash involving mucus membranes or  skin necrosis: Unk Has patient had a PCN reaction that required hospitalization: Unk Has patient had a PCN reaction occurring within the last 10 years: No If all of the above answers are "NO", then may proceed with Cephalosporin use.  No reaction noted    ROS Review of Systems  Constitutional: Negative for chills, diaphoresis, fatigue, fever and unexpected weight change.  HENT: Negative.   Eyes: Negative for photophobia and visual  disturbance.  Respiratory: Negative.   Cardiovascular: Negative.   Gastrointestinal: Negative.  Negative for anal bleeding and blood in stool.  Genitourinary: Negative for hematuria.  Musculoskeletal: Positive for arthralgias, back pain and gait problem.  Skin: Negative for pallor and rash.  Neurological: Negative for light-headedness.  Hematological: Negative.   Psychiatric/Behavioral: The patient is nervous/anxious.       Objective:    Physical Exam  Constitutional: He appears well-developed and well-nourished. No distress.  HENT:  Head: Normocephalic and atraumatic.  Right Ear: External ear normal.  Left Ear: External ear normal.  Mouth/Throat: Oropharynx is clear and moist. No oropharyngeal exudate.  Eyes: Pupils are equal, round, and reactive to light. Conjunctivae are normal. Right eye exhibits no discharge. Left eye exhibits no discharge. No scleral icterus.  Neck: Neck supple. No JVD present. No tracheal deviation present. No thyromegaly present.  Cardiovascular: Normal rate, regular rhythm and normal heart sounds.  Pulmonary/Chest: Effort normal and breath sounds normal. No stridor.  Abdominal: Bowel sounds are normal.  Lymphadenopathy:    He has no cervical adenopathy.  Neurological: He is alert.  Skin: Skin is warm and dry. He is not diaphoretic.  Psychiatric: He has a normal mood and affect. His behavior is normal.    BP 120/70   Pulse 95   Ht 5\' 6"  (1.676 m)   Wt 169 lb (76.7 kg)   SpO2 95%   BMI 27.28 kg/m  Wt Readings from  Last 3 Encounters:  09/24/18 169 lb (76.7 kg)  06/11/18 150 lb 6 oz (68.2 kg)  10/31/17 155 lb (70.3 kg)   BP Readings from Last 3 Encounters:  09/24/18 120/70  06/11/18 110/68  05/12/18 124/60   Guideline developer:  UpToDate (see UpToDate for funding source) Date Released: June 2014  There are no preventive care reminders to display for this patient.  There are no preventive care reminders to display for this patient.  Lab Results  Component Value Date   TSH 2.99 09/26/2016   Lab Results  Component Value Date   WBC 8.0 05/12/2018   HGB 16.4 05/12/2018   HCT 50.4 05/12/2018   MCV 99.8 05/12/2018   PLT 226 05/12/2018   Lab Results  Component Value Date   NA 135 05/12/2018   K 4.0 05/12/2018   CO2 24 05/12/2018   GLUCOSE 92 05/12/2018   BUN 11 05/12/2018   CREATININE 0.53 (L) 05/12/2018   BILITOT 0.8 05/12/2018   ALKPHOS 73 05/12/2018   AST 25 05/12/2018   ALT 24 05/12/2018   PROT 6.4 (L) 05/12/2018   ALBUMIN 3.5 05/12/2018   CALCIUM 9.0 05/12/2018   ANIONGAP 11 05/12/2018   GFR 176.93 12/31/2017   Lab Results  Component Value Date   CHOL 134 04/16/2018   Lab Results  Component Value Date   HDL 51 04/16/2018   Lab Results  Component Value Date   LDLCALC 55 04/16/2018   Lab Results  Component Value Date   TRIG 139 04/16/2018   Lab Results  Component Value Date   CHOLHDL 2.6 04/16/2018   Lab Results  Component Value Date   HGBA1C 5.2 12/31/2017      Assessment & Plan:   Problem List Items Addressed This Visit      Cardiovascular and Mediastinum   Essential hypertension   Relevant Medications   apixaban (ELIQUIS) 2.5 MG TABS tablet   metoprolol succinate (TOPROL-XL) 25 MG 24 hr tablet   pravastatin (PRAVACHOL) 40 MG tablet   Other Relevant Orders  CBC   Basic metabolic panel   Paroxysmal atrial fibrillation (HCC)   Relevant Medications   apixaban (ELIQUIS) 2.5 MG TABS tablet   metoprolol succinate (TOPROL-XL) 25 MG 24 hr tablet    pravastatin (PRAVACHOL) 40 MG tablet     Other   Anxiety - Primary   Relevant Medications   ALPRAZolam (XANAX) 0.25 MG tablet   sertraline (ZOLOFT) 50 MG tablet   Elevated cholesterol   Relevant Medications   apixaban (ELIQUIS) 2.5 MG TABS tablet   metoprolol succinate (TOPROL-XL) 25 MG 24 hr tablet   pravastatin (PRAVACHOL) 40 MG tablet   Other Relevant Orders   LDL cholesterol, direct   Gait disturbance   Relevant Orders   Ambulatory referral to Physical Therapy      Meds ordered this encounter  Medications  . ALPRAZolam (XANAX) 0.25 MG tablet    Sig: Take 1 tablet (0.25 mg total) by mouth at bedtime.    Dispense:  90 tablet    Refill:  0  . apixaban (ELIQUIS) 2.5 MG TABS tablet    Sig: Take 1 tablet (2.5 mg total) by mouth 2 (two) times daily.    Dispense:  180 tablet    Refill:  1  . metoprolol succinate (TOPROL-XL) 25 MG 24 hr tablet    Sig: TAKE 1 TABLET BY MOUTH EVERY MORNING AND TAKE 2 TABLETS BY MOUTH EVERY EVENING    Dispense:  270 tablet    Refill:  3  . pravastatin (PRAVACHOL) 40 MG tablet    Sig: Take 1 tablet (40 mg total) by mouth daily.    Dispense:  90 tablet    Refill:  3    New dose, d/c previous Rx  . sertraline (ZOLOFT) 50 MG tablet    Sig: Take 1 tablet (50 mg total) by mouth daily.    Dispense:  30 tablet    Refill:  3    Follow-up: Return in about 3 months (around 12/23/2018), or if symptoms worsen or fail to improve.   Have increased Zoloft to 50 mg daily.  Continue Xanax nightly.  Have ordered physical therapy for strength training.  Follow-up in 3 months or sooner as needed.

## 2018-09-25 ENCOUNTER — Other Ambulatory Visit: Payer: Self-pay

## 2018-09-25 DIAGNOSIS — E875 Hyperkalemia: Secondary | ICD-10-CM

## 2018-09-26 ENCOUNTER — Telehealth: Payer: Self-pay | Admitting: Family Medicine

## 2018-09-26 NOTE — Telephone Encounter (Signed)
Copied from [place] [id number]. Topic: Quick Communication - Lab Results (Clinic Use ONLY) >> Sep 25, 2018  1:47 PM Rodrigo Ran, CMA wrote: Called patient to inform them of his lab results. When patient returns call, triage nurse may disclose results. >> Sep 26, 2018  1:53 PM Wynetta Emery, [place] C wrote: Pt is returning call for results.

## 2018-10-01 ENCOUNTER — Other Ambulatory Visit (INDEPENDENT_AMBULATORY_CARE_PROVIDER_SITE_OTHER): Payer: Medicare Other

## 2018-10-01 ENCOUNTER — Other Ambulatory Visit: Payer: Medicare Other

## 2018-10-01 DIAGNOSIS — E875 Hyperkalemia: Secondary | ICD-10-CM

## 2018-10-01 LAB — POTASSIUM: POTASSIUM: 4.4 meq/L (ref 3.5–5.1)

## 2018-10-03 ENCOUNTER — Other Ambulatory Visit: Payer: Self-pay | Admitting: Gastroenterology

## 2018-10-05 DIAGNOSIS — L89159 Pressure ulcer of sacral region, unspecified stage: Secondary | ICD-10-CM | POA: Diagnosis not present

## 2018-10-05 DIAGNOSIS — I10 Essential (primary) hypertension: Secondary | ICD-10-CM | POA: Diagnosis not present

## 2018-10-05 DIAGNOSIS — I4891 Unspecified atrial fibrillation: Secondary | ICD-10-CM | POA: Diagnosis not present

## 2018-10-05 DIAGNOSIS — Z7901 Long term (current) use of anticoagulants: Secondary | ICD-10-CM | POA: Diagnosis not present

## 2018-10-11 ENCOUNTER — Telehealth: Payer: Self-pay | Admitting: Gastroenterology

## 2018-10-11 ENCOUNTER — Other Ambulatory Visit: Payer: Self-pay | Admitting: Gastroenterology

## 2018-10-11 NOTE — Telephone Encounter (Signed)
Informed patient prescription refill has been sent to the pharmacy.

## 2018-10-11 NOTE — Telephone Encounter (Signed)
Pt need refills for proctozone -hc 2.5% sent to his pharmacy

## 2018-10-12 ENCOUNTER — Other Ambulatory Visit: Payer: Self-pay | Admitting: Gastroenterology

## 2018-10-25 ENCOUNTER — Other Ambulatory Visit: Payer: Self-pay | Admitting: Gastroenterology

## 2018-10-29 ENCOUNTER — Telehealth: Payer: Self-pay | Admitting: Gastroenterology

## 2018-10-29 NOTE — Telephone Encounter (Signed)
Jones who works for [employer] called and told us that the patient needs a refill for PROCTOZONE-HC 2.5 % rectal cream & hydrocortisone.

## 2018-10-29 NOTE — Telephone Encounter (Signed)
PT has no DPR.

## 2018-10-29 NOTE — Telephone Encounter (Signed)
Cannot speak to Sullivan, he is not on DPR. Also no number taken to get in touch with anyone. Please figure out.

## 2018-10-31 ENCOUNTER — Other Ambulatory Visit: Payer: Self-pay | Admitting: Gastroenterology

## 2018-11-03 ENCOUNTER — Encounter (HOSPITAL_COMMUNITY): Payer: Self-pay

## 2018-11-03 ENCOUNTER — Emergency Department (HOSPITAL_COMMUNITY): Payer: Medicare Other

## 2018-11-03 ENCOUNTER — Emergency Department (HOSPITAL_COMMUNITY)
Admission: EM | Admit: 2018-11-03 | Discharge: 2018-11-03 | Disposition: A | Discharge status: Home / Self Care | Payer: Medicare Other | Attending: Emergency Medicine | Admitting: Emergency Medicine

## 2018-11-03 DIAGNOSIS — R0902 Hypoxemia: Secondary | ICD-10-CM | POA: Diagnosis not present

## 2018-11-03 DIAGNOSIS — Z96649 Presence of unspecified artificial hip joint: Secondary | ICD-10-CM | POA: Diagnosis not present

## 2018-11-03 DIAGNOSIS — Z87891 Personal history of nicotine dependence: Secondary | ICD-10-CM | POA: Diagnosis not present

## 2018-11-03 DIAGNOSIS — J690 Pneumonitis due to inhalation of food and vomit: Secondary | ICD-10-CM | POA: Insufficient documentation

## 2018-11-03 DIAGNOSIS — Z8673 Personal history of transient ischemic attack (TIA), and cerebral infarction without residual deficits: Secondary | ICD-10-CM | POA: Diagnosis not present

## 2018-11-03 DIAGNOSIS — R0989 Other specified symptoms and signs involving the circulatory and respiratory systems: Secondary | ICD-10-CM | POA: Insufficient documentation

## 2018-11-03 DIAGNOSIS — Z7901 Long term (current) use of anticoagulants: Secondary | ICD-10-CM | POA: Insufficient documentation

## 2018-11-03 DIAGNOSIS — R918 Other nonspecific abnormal finding of lung field: Secondary | ICD-10-CM | POA: Diagnosis not present

## 2018-11-03 DIAGNOSIS — Y998 Other external cause status: Secondary | ICD-10-CM | POA: Diagnosis not present

## 2018-11-03 DIAGNOSIS — Z79899 Other long term (current) drug therapy: Secondary | ICD-10-CM | POA: Insufficient documentation

## 2018-11-03 DIAGNOSIS — R0689 Other abnormalities of breathing: Secondary | ICD-10-CM | POA: Diagnosis not present

## 2018-11-03 DIAGNOSIS — R4020 Unspecified coma: Secondary | ICD-10-CM | POA: Diagnosis not present

## 2018-11-03 DIAGNOSIS — Z952 Presence of prosthetic heart valve: Secondary | ICD-10-CM | POA: Insufficient documentation

## 2018-11-03 DIAGNOSIS — Y92019 Unspecified place in single-family (private) house as the place of occurrence of the external cause: Secondary | ICD-10-CM | POA: Insufficient documentation

## 2018-11-03 DIAGNOSIS — Y93G1 Activity, food preparation and clean up: Secondary | ICD-10-CM | POA: Diagnosis not present

## 2018-11-03 DIAGNOSIS — T17920A Food in respiratory tract, part unspecified causing asphyxiation, initial encounter: Secondary | ICD-10-CM | POA: Diagnosis not present

## 2018-11-03 DIAGNOSIS — I10 Essential (primary) hypertension: Secondary | ICD-10-CM | POA: Diagnosis not present

## 2018-11-03 DIAGNOSIS — T17900A Unspecified foreign body in respiratory tract, part unspecified causing asphyxiation, initial encounter: Secondary | ICD-10-CM | POA: Diagnosis present

## 2018-11-03 DIAGNOSIS — X58XXXA Exposure to other specified factors, initial encounter: Secondary | ICD-10-CM | POA: Diagnosis not present

## 2018-11-03 DIAGNOSIS — I517 Cardiomegaly: Secondary | ICD-10-CM | POA: Diagnosis not present

## 2018-11-03 DIAGNOSIS — R404 Transient alteration of awareness: Secondary | ICD-10-CM | POA: Diagnosis not present

## 2018-11-03 LAB — BASIC METABOLIC PANEL
Anion gap: 13 (ref 5–15)
BUN: 14 mg/dL (ref 8–23)
CHLORIDE: 99 mmol/L (ref 98–111)
CO2: 18 mmol/L — ABNORMAL LOW (ref 22–32)
Calcium: 8.2 mg/dL — ABNORMAL LOW (ref 8.9–10.3)
Creatinine, Ser: 0.82 mg/dL (ref 0.61–1.24)
GFR calc Af Amer: >60 mL/min (ref >60)
GFR calc non Af Amer: >60 mL/min (ref >60)
Glucose, Bld: 169 mg/dL — ABNORMAL HIGH (ref 70–99)
Potassium: 3.8 mmol/L (ref 3.5–5.1)
Sodium: 130 mmol/L — ABNORMAL LOW (ref 135–145)

## 2018-11-03 LAB — CBC WITH DIFFERENTIAL/PLATELET
Abs Immature Granulocytes: 0.12 10*3/uL — ABNORMAL HIGH (ref 0.00–0.07)
Basophils Absolute: 0 10*3/uL (ref 0.0–0.1)
Basophils Relative: 0 %
EOS PCT: 1 %
Eosinophils Absolute: 0.1 10*3/uL (ref 0.0–0.5)
HCT: 47.3 % (ref 39.0–52.0)
HEMOGLOBIN: 15.2 g/dL (ref 13.0–17.0)
Immature Granulocytes: 1 %
Lymphocytes Relative: 34 %
Lymphs Abs: 3.8 10*3/uL (ref 0.7–4.0)
MCH: 32.5 pg (ref 26.0–34.0)
MCHC: 32.1 g/dL (ref 30.0–36.0)
MCV: 101.3 fL — ABNORMAL HIGH (ref 80.0–100.0)
Monocytes Absolute: 1.1 10*3/uL — ABNORMAL HIGH (ref 0.1–1.0)
Monocytes Relative: 10 %
Neutro Abs: 6 10*3/uL (ref 1.7–7.7)
Neutrophils Relative %: 54 %
Platelets: 255 10*3/uL (ref 150–400)
RBC: 4.67 MIL/uL (ref 4.22–5.81)
RDW: 13.3 % (ref 11.5–15.5)
WBC: 11 10*3/uL — ABNORMAL HIGH (ref 4.0–10.5)
nRBC: 0 % (ref 0.0–0.2)

## 2018-11-03 MED ORDER — CLINDAMYCIN HCL 150 MG PO CAPS
450.0000 mg | ORAL_CAPSULE | Freq: Once | ORAL | Status: AC
  Administered 2018-11-03: 450 mg via ORAL
  Filled 2018-11-03: qty 3

## 2018-11-03 MED ORDER — CLINDAMYCIN HCL 150 MG PO CAPS: 450.0000 mg | ORAL_CAPSULE | Freq: Three times a day (TID) | ORAL | 0 refills | Status: DC

## 2018-11-03 MED ORDER — CLINDAMYCIN HCL 150 MG PO CAPS: 300.0000 mg | ORAL_CAPSULE | Freq: Once | ORAL | Status: DC

## 2018-11-03 NOTE — ED Notes (Signed)
PT states understanding of care given, follow up care, and medication prescribed. PT wheel chaired  from ED to car.

## 2018-11-03 NOTE — Discharge Instructions (Signed)
Please read and follow all provided instructions.  Your diagnoses today include:  1. Aspiration pneumonitis (HCC)   2. Choking episode     Tests performed today include:  Chest x-ray -shows some signs of aspiration from choking at the bases of both lungs  Vital signs. See below for your results today.   Medications prescribed:   Clindamycin - antibiotic  You have been prescribed an antibiotic medicine: take the entire course of medicine even if you are feeling better. Stopping early can cause the antibiotic not to work.  Take any prescribed medications only as directed.  Home care instructions:  Follow any educational materials contained in this packet.  BE VERY CAREFUL not to take multiple medicines containing Tylenol (also called acetaminophen). Doing so can lead to an overdose which can damage your liver and cause liver failure and possibly death.   Follow-up instructions: Please follow-up with your primary care provider in the next 2 days for further evaluation of your symptoms.   Return instructions:   Please return to the Emergency Department if you experience worsening symptoms.   Return if you develop fever, worsening trouble breathing or shortness of breath, chest pain.  Please return if you have any other emergent concerns.  Additional Information:  Your vital signs today were: BP 101/81 (BP Location: Right Arm)    Pulse 99    Temp 97.7 F (36.5 C) (Oral)    Resp 16    Ht 5\' 6"  (1.676 m)    Wt 74.8 kg    SpO2 96%    BMI 26.63 kg/m  If your blood pressure (BP) was elevated above 135/85 this visit, please have this repeated by your doctor within one month. --------------

## 2018-11-03 NOTE — ED Notes (Signed)
Patient Daughter [name] asked if when patient can to please call her at [phone]

## 2018-11-03 NOTE — ED Provider Notes (Addendum)
[facility] EMERGENCY DEPARTMENT Provider Note   CSN: [id number] Arrival date & time: 11/03/18  1935    History   Chief Complaint Chief Complaint  Patient presents with   Aspiration    HPI Brian [name] is a 85 y.o. male.     85 year old male with history of stroke, valve replacement, atrial fibrillation on Eliquis --presents the emergency department after choking episode.  Patient was eating chicken at dinner when he began to choke.  This was witnessed by his wife and son.  Patient lost consciousness and EMS was called.  Son attempted Heimlich maneuver.  Patient continued to breathe and did not become cyanotic.  When EMS arrived they were able to dislodge to check again with suction.  Patient members waking up in the ambulance and has since returned to his normal baseline mentation.  He currently does not have any complaints.  He arrives on 2 L nasal cannula with oxygen saturation in the low 90s.  No recent fever, cough, nausea, vomiting, or diarrhea.  Patient does not have any history of COPD.  He is a non-smoker.  The onset of this condition was acute. The course is improved. Aggravating factors: none. Alleviating factors: none.       Past Medical History:  Diagnosis Date   Anemia    Atrial flutter (HCC)    BPH (benign prostatic hypertrophy)    Epistaxis    Hemorrhage of gastrointestinal tract, unspecified    History of open heart surgery    Hypertension    Osteoarthrosis, unspecified whether generalized or localized, unspecified site    Personal history of venous thrombosis and embolism    Rosacea    Stroke [facility])    TIA (transient ischemic attack)     Patient Active Problem List   Diagnosis Date Noted   Elevated cholesterol 09/24/2018   Gait disturbance 09/24/2018   Anxiety 04/30/2018   Lumbar radiculopathy 02/14/2018   Pain in joint of left shoulder 12/28/2017   Osteoarthritis of left glenohumeral joint 12/28/2017   Pain in  left foot 10/23/2017   CAP (community acquired pneumonia) 03/13/2017   Coronary artery disease involving native coronary artery of native heart without angina pectoris    Paroxysmal atrial fibrillation (HCC)    Dysarthria, post-stroke    Dysphagia, post-stroke    Cerebrovascular accident (CVA) due to thrombosis of precerebral artery ([place])    Scoliosis (and kyphoscoliosis), idiopathic 01/26/2014   Hip pain 02/07/2012   Osteoarthritis of hip 02/07/2012   Sacroiliitis ([place]) 02/07/2012   ROTATOR CUFF SYNDROME, LEFT 03/07/2010   OTH MALIG NEOPLASM SKIN OTH&UNSPEC PARTS FACE 12/06/2009   ACTINIC KERATOSIS, HEAD 09/06/2009   Osteoarthritis 02/20/2009   MITRAL VALVE REPLACEMENT, HX OF 02/20/2009   HIP REPLACEMENT, TOTAL, HX OF 02/20/2009   APPENDECTOMY, HX OF 02/20/2009   HERNIORRHAPHY, HX OF 02/20/2009   EPISTAXIS, RECURRENT 11/19/2008   UNS ADVRS EFF UNS RX MEDICINAL&BIOLOGICAL SBSTNC 06/12/2008   OTHER AND UNSPECIFIED MITRAL VALVE DISEASES 03/12/2008   ATRIAL FLUTTER 03/12/2008   Essential hypertension 03/06/2007   PEPTIC ULCER DISEASE 03/06/2007   BENIGN PROSTATIC HYPERTROPHY 03/06/2007   Coronary atherosclerosis 02/13/2007   ROSACEA 02/13/2007   DVT, HX OF 02/13/2007    Past Surgical History:  Procedure Laterality Date   APPENDECTOMY     HERNIA REPAIR     IR GENERIC HISTORICAL  05/04/2016   IR PERCUTANEOUS ART THROMBECTOMY/INFUSION INTRACRANIAL INC DIAG ANGIO 05/04/2016 [name], MD [facility]   IR GENERIC HISTORICAL  05/04/2016  IR ANGIO VERTEBRAL SEL SUBCLAVIAN INNOMINATE UNI R MOD SED 05/04/2016 [name], MD [facility]   IR GENERIC HISTORICAL  06/07/2016   IR RADIOLOGIST EVAL & MGMT 06/07/2016 [facility]   JOINT REPLACEMENT     MITRAL VALVE REPAIR     mvp repair     RADIOLOGY WITH ANESTHESIA N/A 05/04/2016   Procedure: RADIOLOGY WITH ANESTHESIA;  Surgeon: [name], MD;  Location: [place];  Service:  Radiology;  Laterality: N/A;   TOTAL HIP ARTHROPLASTY          Home Medications    Prior to Admission medications   Medication Sig Start Date End Date Taking? Authorizing Provider  ALPRAZolam (XANAX) 0.25 MG tablet Take 1 tablet (0.25 mg total) by mouth at bedtime. 09/24/18   Libby Maw, MD  apixaban (ELIQUIS) 2.5 MG TABS tablet Take 1 tablet (2.5 mg total) by mouth 2 (two) times daily. 09/24/18   Libby Maw, MD  Catheters (GIZMO CONDOM CATHETER) Wood USE AS NEEDED 11/15/17   Nafziger, Tommi Rumps, NP  cetirizine (ZYRTEC) 10 MG tablet TK 1 T PO QAM 04/24/18   [provider]  doxycycline (VIBRA-TABS) 100 MG tablet Take 1 tablet (100 mg total) by mouth 2 (two) times daily. 06/11/18   Libby Maw, MD  finasteride (PROSCAR) 5 MG tablet Take 1 tablet (5 mg total) by mouth daily. 12/31/17   Marletta Lor, MD  HYDROcodone-acetaminophen (NORCO/VICODIN) 5-325 MG tablet 1 tablet every 8-12 hours as needed for pain not to exceed 2 tablets in a 24-hour. 12/31/17   Marletta Lor, MD  hydrocortisone (ANUCORT-HC) 25 MG suppository UNWRAP AND INSERT 1 SUPPOSITORY RECTALLY TWICE DAILY 10/14/18   Ladene Artist, MD  hydrocortisone-pramoxine [facility]) 2.5-1 % rectal cream hydrocortisone-pramoxine 2.5 %-1 % rectal cream  U REC TID    [provider]  ipratropium (ATROVENT) 0.06 % nasal spray ipratropium bromide 42 mcg (0.06 %) nasal spray    [provider]  loratadine (CLARITIN) 10 MG tablet Take 10 mg by mouth daily. 10/18/17   [provider]  metoprolol succinate (TOPROL-XL) 25 MG 24 hr tablet TAKE 1 TABLET BY MOUTH EVERY MORNING AND TAKE 2 TABLETS BY MOUTH EVERY EVENING 09/24/18   Libby Maw, MD  oxyCODONE-acetaminophen (PERCOCET/ROXICET) 5-325 MG tablet TK 1 T PO TID PRN 04/23/18   [provider]  pantoprazole (PROTONIX) 40 MG tablet Take 1 tablet (40 mg total) by mouth daily. 09/20/18   Libby Maw, MD    pravastatin (PRAVACHOL) 40 MG tablet Take 1 tablet (40 mg total) by mouth daily. 09/24/18   Libby Maw, MD  PROCTOZONE-HC 2.5 % rectal cream USE RECTALLY TWICE DAILY 10/11/18   Ladene Artist, MD  SANTYL ointment APP TO CLEANSED AFFECTED AREA ONCE D 04/06/18   [provider]  sertraline (ZOLOFT) 50 MG tablet Take 1 tablet (50 mg total) by mouth daily. 09/24/18   Libby Maw, MD    Family History Family History  Problem Relation Age of Onset   Rheumatic fever Mother    Coronary artery disease Father     Social History Social History   Tobacco Use   Smoking status: Former Smoker   Smokeless tobacco: Never Used   Tobacco comment: quit 26 yr ago  Substance Use Topics   Alcohol use: No   Drug use: No     Allergies   Novocain [procaine hcl]; Other; and Penicillins   Review of Systems Review of Systems  Constitutional:  Negative for fever.  HENT: Negative for rhinorrhea and sore throat.   Eyes: Negative for redness.  Respiratory: Positive for choking and shortness of breath. Negative for cough and wheezing.   Cardiovascular: Negative for chest pain.  Gastrointestinal: Negative for abdominal pain, diarrhea, nausea and vomiting.  Genitourinary: Negative for dysuria.  Musculoskeletal: Negative for myalgias.  Skin: Negative for rash.  Neurological: Positive for syncope and facial asymmetry (chronic). Negative for headaches.     Physical Exam Updated Vital Signs BP 101/81 (BP Location: Right Arm)    Pulse 99    Temp 97.7 F (36.5 C) (Oral)    Resp 16    Ht 5\' 6"  (1.676 m)    Wt 74.8 kg    SpO2 96%    BMI 26.63 kg/m   Physical Exam Vitals signs and nursing note reviewed.  Constitutional:      Appearance: He is well-developed.  HENT:     Head: Normocephalic and atraumatic.  Eyes:     General:        Right eye: No discharge.        Left eye: No discharge.     Conjunctiva/sclera: Conjunctivae normal.  Neck:     Musculoskeletal:  Normal range of motion and neck supple.  Cardiovascular:     Rate and Rhythm: Normal rate. Rhythm irregular.  Pulmonary:     Effort: Pulmonary effort is normal. No respiratory distress.     Breath sounds: Normal breath sounds. No stridor. No wheezing, rhonchi or rales.  Abdominal:     Palpations: Abdomen is soft.     Tenderness: There is no abdominal tenderness. There is no guarding or rebound.  Skin:    General: Skin is warm and dry.  Neurological:     Mental Status: He is alert.      ED Treatments / Results  Labs (all labs ordered are listed, but only abnormal results are displayed) Labs Reviewed  CBC WITH DIFFERENTIAL/PLATELET - Abnormal; Notable for the following components:      Result Value   WBC 11.0 (*)    MCV 101.3 (*)    Monocytes Absolute 1.1 (*)    Abs Immature Granulocytes 0.12 (*)    All other components within normal limits  BASIC METABOLIC PANEL - Abnormal; Notable for the following components:   Sodium 130 (*)    CO2 18 (*)    Glucose, Bld 169 (*)    Calcium 8.2 (*)    All other components within normal limits    EKG EKG Interpretation  Date/Time:  Sunday November 03 2018 19:39:56 EDT Ventricular Rate:  97 PR Interval:    QRS Duration: 134 QT Interval:  409 QTC Calculation: 520 R Axis:   -53 Text Interpretation:  Accelerated junctional rhythm LVH with IVCD, LAD and secondary repol abnrm Prolonged QT interval Confirmed by Lennice Sites [phone]) on 11/03/2018 8:33:28 PM   Radiology Dg Chest 2 View  Result Date: 11/03/2018 CLINICAL DATA:  Choked earlier.  Unresponsive. EXAM: CHEST - 2 VIEW COMPARISON:  05/12/2018 FINDINGS: Cardiomegaly. Prior median sternotomy and valve replacement. Mild vascular congestion. Bilateral lower lobe airspace opacities are noted, left greater than right. This could reflect edema or infection. Aspiration is not excluded. No visible effusions or acute bony abnormality. IMPRESSION: Cardiomegaly, vascular congestion. Bilateral  lower lobe airspace opacities, left greater than right. Edema versus infection. Aspiration not excluded. Electronically Signed   By: Rolm Baptise M.D.   On: 11/03/2018 20:38    Procedures Procedures (including critical care  time)  Medications Ordered in ED Medications  clindamycin (CLEOCIN) capsule 450 mg (has no administration in time range)     Initial Impression / Assessment and Plan / ED Course  I have reviewed the triage vital signs and the nursing notes.  Pertinent labs & imaging results that were available during my care of the patient were reviewed by me and considered in my medical decision making (see chart for details).        Patient seen and examined. Work-up initiated.   Vital signs reviewed and are as follows: BP 101/81 (BP Location: Right Arm)    Pulse 99    Temp 97.7 F (36.5 C) (Oral)    Resp 16    Ht 5\' 6"  (1.676 m)    Wt 74.8 kg    SpO2 96%    BMI 26.63 kg/m   Chest x-ray and EKG personally reviewed.  Patient with likely basilar pneumonitis due to his choking episode.    Patient discussed with Dr. Ronnald Nian who will see.  9:47 PM discussed admission versus discharge with patient and family.  Off of oxygen, patient is maintaining his oxygen saturation now between 92 to 93%.  He is comfortable and is in no distress.  He speaks in full sentences.  We discussed possibility of developing aspiration pneumonia and need for close follow-up or return to the emergency department or PCP if his breathing gets worse.  Patient would like to be discharged home tonight.  Will start on course of clindamycin due to penicillin allergy.  Reviewed up-to-date recommendations.  10:21 PM Called back to patient room as BP is low.  Patient mentating normally without any lightheadedness or dizziness.  States that his blood pressure typically runs a bit low.  He states that he has a nurse who comes out and checks blood pressure daily.  Next check at 11 AM tomorrow.  Patient continues to want  to go home.  Family is comfortable with monitoring patient.  As he is asymptomatic, I feel this is reasonable.  He should check his blood pressure in the morning and not take his a.m. metoprolol if systolic blood pressure is less than 100.  He has had his evening dose of metoprolol already tonight.  Final Clinical Impressions(s) / ED Diagnoses   Final diagnoses:  Aspiration pneumonitis ([place])  Choking episode   Patient presents after choking episode tonight.  No reported cyanosis.  Obstruction was cleared by EMS.  Patient did have loss of consciousness, now resolved and back to baseline.  He is in no respiratory distress.  Oxygen saturation low normal range.  Offered admission versus discharged home.  Patient wants to go home.  Will start on antibiotics to hopefully prevent any bacterial pneumonia from aspiration.  Return instructions discussed with family at bedside who seem reliable to take action if these occur.  ED Discharge Orders         Ordered    clindamycin (CLEOCIN) 150 MG capsule  3 times daily     11/03/18 2153           Carlisle Cater, PA-C 11/03/18 2158    Carlisle Cater, PA-C 11/03/18 [place], [place], DO 11/04/18 2440

## 2018-11-03 NOTE — ED Triage Notes (Signed)
Pt was eating chicken when he aspirated and when out for 5-7 minutes pt was being bagged by ems and o2 stats were 97%. Chicken was retrived by forceps and while trying to intubate pt had a gag reflex and came back to it pt was placed on 6l of o2 with a stats of 92%. While pt is on RA pt is 94%.

## 2018-11-03 NOTE — ED Notes (Signed)
Pt states bp is usually in the 70's notfied pa and told pt to follow up with cards and let them know bp has been that low .

## 2018-11-05 ENCOUNTER — Encounter: Payer: Self-pay | Admitting: Family Medicine

## 2018-11-05 ENCOUNTER — Ambulatory Visit (INDEPENDENT_AMBULATORY_CARE_PROVIDER_SITE_OTHER): Payer: Medicare Other | Admitting: Family Medicine

## 2018-11-05 ENCOUNTER — Other Ambulatory Visit: Payer: Self-pay

## 2018-11-05 VITALS — BP 100/60 | HR 55 | Ht 66.0 in

## 2018-11-05 DIAGNOSIS — F419 Anxiety disorder, unspecified: Secondary | ICD-10-CM

## 2018-11-05 DIAGNOSIS — J301 Allergic rhinitis due to pollen: Secondary | ICD-10-CM

## 2018-11-05 DIAGNOSIS — M19012 Primary osteoarthritis, left shoulder: Secondary | ICD-10-CM

## 2018-11-05 DIAGNOSIS — M169 Osteoarthritis of hip, unspecified: Secondary | ICD-10-CM

## 2018-11-05 DIAGNOSIS — I6300 Cerebral infarction due to thrombosis of unspecified precerebral artery: Secondary | ICD-10-CM | POA: Diagnosis not present

## 2018-11-05 DIAGNOSIS — K219 Gastro-esophageal reflux disease without esophagitis: Secondary | ICD-10-CM

## 2018-11-05 DIAGNOSIS — M79672 Pain in left foot: Secondary | ICD-10-CM

## 2018-11-05 DIAGNOSIS — M25559 Pain in unspecified hip: Secondary | ICD-10-CM | POA: Diagnosis not present

## 2018-11-05 DIAGNOSIS — I480 Paroxysmal atrial fibrillation: Secondary | ICD-10-CM | POA: Diagnosis not present

## 2018-11-05 DIAGNOSIS — I10 Essential (primary) hypertension: Secondary | ICD-10-CM

## 2018-11-05 MED ORDER — METOPROLOL SUCCINATE ER 25 MG PO TB24: 25.0000 mg | ORAL_TABLET | Freq: Every day | ORAL | 3 refills | Status: DC

## 2018-11-05 MED ORDER — ALPRAZOLAM 0.25 MG PO TABS: 0.2500 mg | ORAL_TABLET | Freq: Every day | ORAL | 1 refills | Status: DC

## 2018-11-05 MED ORDER — PRAVASTATIN SODIUM 40 MG PO TABS: 40.0000 mg | ORAL_TABLET | Freq: Every day | ORAL | 3 refills | Status: DC

## 2018-11-05 MED ORDER — CETIRIZINE HCL 10 MG PO TABS: 10.0000 mg | ORAL_TABLET | Freq: Every day | ORAL | 2 refills | Status: DC

## 2018-11-05 MED ORDER — PANTOPRAZOLE SODIUM 40 MG PO TBEC: 40.0000 mg | DELAYED_RELEASE_TABLET | Freq: Every day | ORAL | 3 refills | Status: DC

## 2018-11-05 MED ORDER — APIXABAN 2.5 MG PO TABS: 2.5000 mg | ORAL_TABLET | Freq: Two times a day (BID) | ORAL | 1 refills | Status: DC

## 2018-11-05 MED ORDER — OXYCODONE-ACETAMINOPHEN 5-325 MG PO TABS: 1.0000 | ORAL_TABLET | Freq: Two times a day (BID) | ORAL | 0 refills | Status: DC

## 2018-11-05 MED ORDER — SERTRALINE HCL 50 MG PO TABS: 50.0000 mg | ORAL_TABLET | ORAL | 3 refills | Status: DC

## 2018-11-05 NOTE — Progress Notes (Signed)
Established Patient Office Visit  Subjective:  Patient ID: Brian [name], male    DOB: 06-18-1933  Age: 85 y.o. MRN: [id number]  CC:  Chief Complaint  Patient presents with  . Hospitalization Follow-up    HPI Brian [name] presents for hospital follow-up status post acute choking episode at his house.  EMS was summoned and choking was relieved.  Blood blood pressure and pulse both remain low on metoprolol XL 25 mg 3 times daily.  Patient has been taking his Xanax for sleep with his evening meal.  He has been taking the Zyrtec in the morning.  He has been taking Percocet 1 in the morning and 1 with his evening meal.  Past Medical History:  Diagnosis Date  . Anemia   . Atrial flutter ([place])   . BPH (benign prostatic hypertrophy)   . Epistaxis   . Hemorrhage of gastrointestinal tract, unspecified   . History of open heart surgery   . Hypertension   . Osteoarthrosis, unspecified whether generalized or localized, unspecified site   . Personal history of venous thrombosis and embolism   . Rosacea   . Stroke ([place])   . TIA (transient ischemic attack)     Past Surgical History:  Procedure Laterality Date  . APPENDECTOMY    . HERNIA REPAIR    . IR GENERIC HISTORICAL  05/04/2016   IR PERCUTANEOUS ART THROMBECTOMY/INFUSION INTRACRANIAL INC DIAG ANGIO 05/04/2016 [name], MD [facility]  . IR GENERIC HISTORICAL  05/04/2016   IR ANGIO VERTEBRAL SEL SUBCLAVIAN INNOMINATE UNI R MOD SED 05/04/2016 [name], MD [facility]  . IR GENERIC HISTORICAL  06/07/2016   IR RADIOLOGIST EVAL & MGMT 06/07/2016 [facility]  . JOINT REPLACEMENT    . MITRAL VALVE REPAIR    . mvp repair    . RADIOLOGY WITH ANESTHESIA N/A 05/04/2016   Procedure: RADIOLOGY WITH ANESTHESIA;  Surgeon: [name], MD;  Location: [facility];  Service: Radiology;  Laterality: N/A;  . TOTAL HIP ARTHROPLASTY      Family History  Problem Relation Age of Onset  . Rheumatic fever Mother   . Coronary  artery disease Father     Social History   Socioeconomic History  . Marital status: Married    Spouse name: Not on file  . Number of children: Not on file  . Years of education: Not on file  . Highest education level: Not on file  Occupational History  . Not on file  Social Needs  . Financial resource strain: Not on file  . Food insecurity:    Worry: Not on file    Inability: Not on file  . Transportation needs:    Medical: Not on file    Non-medical: Not on file  Tobacco Use  . Smoking status: Former Research scientist (life sciences)  . Smokeless tobacco: Never Used  . Tobacco comment: quit 50 yr ago  Substance and Sexual Activity  . Alcohol use: No  . Drug use: No  . Sexual activity: Not on file  Lifestyle  . Physical activity:    Days per week: Not on file    Minutes per session: Not on file  . Stress: Not on file  Relationships  . Social connections:    Talks on phone: Not on file    Gets together: Not on file    Attends religious service: Not on file    Active member of club or organization: Not on file    Attends meetings of clubs or organizations:  Not on file    Relationship status: Not on file  . Intimate partner violence:    Fear of current or ex partner: Not on file    Emotionally abused: Not on file    Physically abused: Not on file    Forced sexual activity: Not on file  Other Topics Concern  . Not on file  Social History Narrative  . Not on file    Outpatient Medications Prior to Visit  Medication Sig Dispense Refill  . clindamycin (CLEOCIN) 150 MG capsule Take 3 capsules (450 mg total) by mouth 3 (three) times daily. Take three times daily with breakfast, lunch, and dinner. 56 capsule 0  . finasteride (PROSCAR) 5 MG tablet Take 1 tablet (5 mg total) by mouth daily. 90 tablet 3  . hydrocortisone (ANUCORT-HC) 25 MG suppository UNWRAP AND INSERT 1 SUPPOSITORY RECTALLY TWICE DAILY (Patient taking differently: Place 25 mg rectally daily. ) 12 suppository 0  .  hydrocortisone-pramoxine (ANALPRAM-HC) 2.5-1 % rectal cream Place 1 application rectally 2 (two) times daily as needed for hemorrhoids.     Marland Kitchen PROCTOZONE-HC 2.5 % rectal cream USE RECTALLY TWICE DAILY (Patient taking differently: Place 1 application rectally 2 (two) times daily. ) 30 g 0  . ALPRAZolam (XANAX) 0.25 MG tablet Take 1 tablet (0.25 mg total) by mouth at bedtime. 90 tablet 0  . apixaban (ELIQUIS) 2.5 MG TABS tablet Take 1 tablet (2.5 mg total) by mouth 2 (two) times daily. 180 tablet 1  . cetirizine (ZYRTEC) 10 MG tablet Take 10 mg by mouth every morning.   2  . doxycycline (VIBRA-TABS) 100 MG tablet Take 1 tablet (100 mg total) by mouth 2 (two) times daily. (Patient not taking: Reported on 11/03/2018) 20 tablet 0  . HYDROcodone-acetaminophen (NORCO/VICODIN) 5-325 MG tablet 1 tablet every 8-12 hours as needed for pain not to exceed 2 tablets in a 24-hour. (Patient not taking: Reported on 11/03/2018) 90 tablet 0  . metoprolol succinate (TOPROL-XL) 25 MG 24 hr tablet TAKE 1 TABLET BY MOUTH EVERY MORNING AND TAKE 2 TABLETS BY MOUTH EVERY EVENING (Patient taking differently: Take 25-50 mg by mouth 2 (two) times daily. ) 270 tablet 3  . oxyCODONE-acetaminophen (PERCOCET/ROXICET) 5-325 MG tablet Take 1 tablet by mouth 2 (two) times daily.   0  . pantoprazole (PROTONIX) 40 MG tablet Take 1 tablet (40 mg total) by mouth daily. 90 tablet 3  . pravastatin (PRAVACHOL) 40 MG tablet Take 1 tablet (40 mg total) by mouth daily. 90 tablet 3  . sertraline (ZOLOFT) 50 MG tablet Take 1 tablet (50 mg total) by mouth daily. 30 tablet 3   No facility-administered medications prior to visit.     Allergies  Allergen Reactions  . Novocain [Procaine Hcl]     Irregular heart beat     . Other     Patient had problem with epidural with his right hip surgery. Went to up extremity instead of lower extremity   . Penicillins Itching    Has patient had a PCN reaction causing immediate rash, facial/tongue/throat  swelling, SOB or lightheadedness with hypotension: Unk Has patient had a PCN reaction causing severe rash involving mucus membranes or skin necrosis: Unk Has patient had a PCN reaction that required hospitalization: Unk Has patient had a PCN reaction occurring within the last 10 years: No If all of the above answers are "NO", then may proceed with Cephalosporin use.  No reaction noted    ROS Review of Systems  Constitutional: Negative for chills,  diaphoresis, fatigue, fever and unexpected weight change.  HENT: Negative.   Eyes: Negative for photophobia and visual disturbance.  Respiratory: Negative.   Cardiovascular: Negative.   Gastrointestinal: Negative.   Endocrine: Negative for polyphagia and polyuria.  Musculoskeletal: Positive for arthralgias and gait problem.  Skin: Negative for color change and pallor.  Allergic/Immunologic: Negative for immunocompromised state.  Neurological: Positive for weakness.  Psychiatric/Behavioral: Positive for sleep disturbance. The patient is nervous/anxious.       Objective:    Physical Exam  Constitutional: He is oriented to person, place, and time. He appears well-developed and well-nourished. No distress.  HENT:  Head: Normocephalic and atraumatic.  Right Ear: External ear normal.  Left Ear: External ear normal.  Mouth/Throat: Mucous membranes are dry.  Eyes: Conjunctivae are normal. Right eye exhibits no discharge. Left eye exhibits no discharge. No scleral icterus.  Neck: No JVD present. No tracheal deviation present.  Cardiovascular: An irregularly irregular rhythm present.  Pulmonary/Chest: Effort normal and breath sounds normal. No stridor.  Neurological: He is alert and oriented to person, place, and time.  Skin: Skin is warm and dry. He is not diaphoretic.  Psychiatric: He has a normal mood and affect. His behavior is normal.    BP 100/60   Pulse (!) 55   Ht 5\' 6"  (1.676 m)   SpO2 98%   BMI 26.63 kg/m  Wt Readings from  Last 3 Encounters:  11/03/18 165 lb (74.8 kg)  09/24/18 143 lb (64.9 kg)  06/11/18 150 lb 6 oz (68.2 kg)   BP Readings from Last 3 Encounters:  11/05/18 100/60  11/03/18 (!) 82/42  09/24/18 120/70   Guideline developer:  UpToDate (see UpToDate for funding source) Date Released: June 2014  There are no preventive care reminders to display for this patient.  There are no preventive care reminders to display for this patient.  Lab Results  Component Value Date   TSH 2.99 09/26/2016   Lab Results  Component Value Date   WBC 11.0 (H) 11/03/2018   HGB 15.2 11/03/2018   HCT 47.3 11/03/2018   MCV 101.3 (H) 11/03/2018   PLT 255 11/03/2018   Lab Results  Component Value Date   NA 130 (L) 11/03/2018   K 3.8 11/03/2018   CO2 18 (L) 11/03/2018   GLUCOSE 169 (H) 11/03/2018   BUN 14 11/03/2018   CREATININE 0.82 11/03/2018   BILITOT 0.8 05/12/2018   ALKPHOS 73 05/12/2018   AST 25 05/12/2018   ALT 24 05/12/2018   PROT 6.4 (L) 05/12/2018   ALBUMIN 3.5 05/12/2018   CALCIUM 8.2 (L) 11/03/2018   ANIONGAP 13 11/03/2018   GFR 148.22 09/24/2018   Lab Results  Component Value Date   CHOL 134 04/16/2018   Lab Results  Component Value Date   HDL 51 04/16/2018   Lab Results  Component Value Date   LDLCALC 55 04/16/2018   Lab Results  Component Value Date   TRIG 139 04/16/2018   Lab Results  Component Value Date   CHOLHDL 2.6 04/16/2018   Lab Results  Component Value Date   HGBA1C 5.2 12/31/2017      Assessment & Plan:   Problem List Items Addressed This Visit      Cardiovascular and Mediastinum   Essential hypertension - Primary   Relevant Medications   apixaban (ELIQUIS) 2.5 MG TABS tablet   metoprolol succinate (TOPROL-XL) 25 MG 24 hr tablet   pravastatin (PRAVACHOL) 40 MG tablet   Cerebrovascular accident (CVA) due to thrombosis  of precerebral artery (HCC)   Relevant Medications   apixaban (ELIQUIS) 2.5 MG TABS tablet   metoprolol succinate (TOPROL-XL) 25  MG 24 hr tablet   pravastatin (PRAVACHOL) 40 MG tablet   Paroxysmal atrial fibrillation (HCC)   Relevant Medications   apixaban (ELIQUIS) 2.5 MG TABS tablet   metoprolol succinate (TOPROL-XL) 25 MG 24 hr tablet   pravastatin (PRAVACHOL) 40 MG tablet     Musculoskeletal and Integument   Osteoarthritis of hip   Relevant Medications   oxyCODONE-acetaminophen (PERCOCET/ROXICET) 5-325 MG tablet   Osteoarthritis of left glenohumeral joint   Relevant Medications   oxyCODONE-acetaminophen (PERCOCET/ROXICET) 5-325 MG tablet     Other   Hip pain   Relevant Medications   oxyCODONE-acetaminophen (PERCOCET/ROXICET) 5-325 MG tablet   Pain in left foot   Relevant Medications   oxyCODONE-acetaminophen (PERCOCET/ROXICET) 5-325 MG tablet   Anxiety   Relevant Medications   ALPRAZolam (XANAX) 0.25 MG tablet   sertraline (ZOLOFT) 50 MG tablet    Other Visit Diagnoses    Gastroesophageal reflux disease, esophagitis presence not specified       Relevant Medications   pantoprazole (PROTONIX) 40 MG tablet   Allergic rhinitis due to pollen, unspecified seasonality       Relevant Medications   cetirizine (ZYRTEC) 10 MG tablet      Meds ordered this encounter  Medications  . ALPRAZolam (XANAX) 0.25 MG tablet    Sig: Take 1 tablet (0.25 mg total) by mouth at bedtime.    Dispense:  90 tablet    Refill:  1  . apixaban (ELIQUIS) 2.5 MG TABS tablet    Sig: Take 1 tablet (2.5 mg total) by mouth 2 (two) times daily.    Dispense:  180 tablet    Refill:  1  . cetirizine (ZYRTEC) 10 MG tablet    Sig: Take 1 tablet (10 mg total) by mouth at bedtime.    Dispense:  90 tablet    Refill:  2  . metoprolol succinate (TOPROL-XL) 25 MG 24 hr tablet    Sig: Take 1 tablet (25 mg total) by mouth daily.    Dispense:  90 tablet    Refill:  3  . oxyCODONE-acetaminophen (PERCOCET/ROXICET) 5-325 MG tablet    Sig: Take 1 tablet by mouth 2 (two) times daily.    Dispense:  180 tablet    Refill:  0  . pantoprazole  (PROTONIX) 40 MG tablet    Sig: Take 1 tablet (40 mg total) by mouth daily.    Dispense:  90 tablet    Refill:  3  . pravastatin (PRAVACHOL) 40 MG tablet    Sig: Take 1 tablet (40 mg total) by mouth daily.    Dispense:  90 tablet    Refill:  3    New dose, d/c previous Rx  . sertraline (ZOLOFT) 50 MG tablet    Sig: Take 1 tablet (50 mg total) by mouth every morning.    Dispense:  90 tablet    Refill:  3    Follow-up: Return in about 6 months (around 05/08/2019), or if symptoms worsen or fail to improve.   We had a long discussion and plan about rearranging his medicines to minimize his risk for choking.  Patient will take the Zyrtec at night.  He will take his Xanax tablet just before bed instead of earlier in the day.  He will take the Percocet first thing in the morning and then the second pill after dinner.  Continue taking  the Zoloft in the morning.  Have decreased the metoprolol down to 25 mg XL in the afternoon and evening from 75 mg total daily dose.  Encouraged hydration.

## 2018-11-15 ENCOUNTER — Telehealth: Payer: Self-pay | Admitting: Gastroenterology

## 2018-11-15 MED ORDER — HYDROCORTISONE 2.5 % RE CREA: TOPICAL_CREAM | RECTAL | 0 refills | Status: DC

## 2018-11-15 NOTE — Telephone Encounter (Signed)
Prescription sent to patient's pharmacy.

## 2018-11-15 NOTE — Telephone Encounter (Signed)
Pt would like a refill for PROCTOZONE-HC 2.5 % rectal cream  & hydrocortisone

## 2018-11-16 DIAGNOSIS — L89159 Pressure ulcer of sacral region, unspecified stage: Secondary | ICD-10-CM | POA: Diagnosis not present

## 2018-11-16 DIAGNOSIS — Z7901 Long term (current) use of anticoagulants: Secondary | ICD-10-CM | POA: Diagnosis not present

## 2018-11-16 DIAGNOSIS — I4891 Unspecified atrial fibrillation: Secondary | ICD-10-CM | POA: Diagnosis not present

## 2018-11-16 DIAGNOSIS — F419 Anxiety disorder, unspecified: Secondary | ICD-10-CM | POA: Diagnosis not present

## 2018-11-16 DIAGNOSIS — I10 Essential (primary) hypertension: Secondary | ICD-10-CM | POA: Diagnosis not present

## 2018-11-26 ENCOUNTER — Telehealth: Payer: Self-pay | Admitting: Gastroenterology

## 2018-11-26 MED ORDER — HYDROCORTISONE ACETATE 25 MG RE SUPP: RECTAL | 0 refills | Status: DC

## 2018-11-26 NOTE — Telephone Encounter (Signed)
Prescription sent to [facility] on [address] per patient's request.

## 2018-11-26 NOTE — Telephone Encounter (Signed)
Pt needs rf for anucort sent to [facility] at the [facility].

## 2018-12-11 ENCOUNTER — Telehealth: Payer: Self-pay | Admitting: Family Medicine

## 2018-12-11 ENCOUNTER — Telehealth: Payer: Self-pay | Admitting: Gastroenterology

## 2018-12-11 NOTE — Telephone Encounter (Signed)
Called patient and rescheduled appointment because of covid 19, Patient didn't want to do virtual visit. Patient son said he was trying to give him a new vitamin and wanted to know if it was ok but I cant make that choice so I said I will let the nurse and MD know so they can talk to him

## 2018-12-12 MED ORDER — HYDROCORTISONE ACETATE 25 MG RE SUPP: RECTAL | 1 refills | Status: DC

## 2018-12-12 NOTE — Telephone Encounter (Signed)
Prescription sent to patient's pharmacy for a 30 day supply and one refill.

## 2018-12-14 DIAGNOSIS — L89159 Pressure ulcer of sacral region, unspecified stage: Secondary | ICD-10-CM | POA: Diagnosis not present

## 2018-12-14 DIAGNOSIS — M1990 Unspecified osteoarthritis, unspecified site: Secondary | ICD-10-CM | POA: Diagnosis not present

## 2018-12-14 DIAGNOSIS — E785 Hyperlipidemia, unspecified: Secondary | ICD-10-CM | POA: Diagnosis not present

## 2018-12-14 DIAGNOSIS — I4891 Unspecified atrial fibrillation: Secondary | ICD-10-CM | POA: Diagnosis not present

## 2018-12-14 DIAGNOSIS — Z7901 Long term (current) use of anticoagulants: Secondary | ICD-10-CM | POA: Diagnosis not present

## 2018-12-14 DIAGNOSIS — K644 Residual hemorrhoidal skin tags: Secondary | ICD-10-CM | POA: Diagnosis not present

## 2018-12-23 ENCOUNTER — Telehealth: Payer: Self-pay | Admitting: Family Medicine

## 2018-12-23 DIAGNOSIS — F419 Anxiety disorder, unspecified: Secondary | ICD-10-CM

## 2018-12-23 MED ORDER — ALPRAZOLAM 0.25 MG PO TABS: 0.2500 mg | ORAL_TABLET | Freq: Every day | ORAL | 0 refills | Status: DC

## 2018-12-23 NOTE — Telephone Encounter (Signed)
Rx canceled at [facility] in [place], new Rx called into the [facility] on [address].

## 2018-12-23 NOTE — Telephone Encounter (Signed)
yes

## 2018-12-23 NOTE — Telephone Encounter (Signed)
Copied from [place] [id number]. Topic: Quick Communication - Rx Refill/Question >> Dec 23, 2018 10:05 AM Yvette Rack wrote: [facility] called for Rx due to it being a controlled substance it can not be transferred.   Medication: ALPRAZolam (XANAX) 0.25 MG tablet  Has the patient contacted their pharmacy? yes   Preferred Pharmacy (with phone number or street name): [id number] - [place], [place] [address] AT [place] [phone] (Phone) [phone] (Fax)  Agent: Please be advised that RX refills may take up to 3 business days. We ask that you follow-up with your pharmacy.

## 2018-12-23 NOTE — Telephone Encounter (Signed)
Okay to send to [facility] on [address]? I will call and cancel the other refill remaining at the [facility] in [place].

## 2018-12-25 ENCOUNTER — Telehealth: Payer: Self-pay | Admitting: Gastroenterology

## 2018-12-25 NOTE — Telephone Encounter (Signed)
The pt was advised to call his pharmacy for any drug interactions. The pt has been advised of the information and verbalized understanding.

## 2019-01-02 ENCOUNTER — Telehealth: Payer: Self-pay

## 2019-01-02 NOTE — Telephone Encounter (Signed)
Copied from [place] [phone]. Topic: General - Other >> Jan 01, 2019  1:09 PM Burchel, Abbi R wrote: Pt has questions about the safety of acupuncture with his blood thinner.  Please call to advise.   Pt would also like to start PT.  Please advise.   [phone]

## 2019-01-14 ENCOUNTER — Ambulatory Visit: Payer: Medicare Other | Admitting: Family Medicine

## 2019-01-16 DIAGNOSIS — J020 Streptococcal pharyngitis: Secondary | ICD-10-CM | POA: Diagnosis not present

## 2019-01-16 DIAGNOSIS — I10 Essential (primary) hypertension: Secondary | ICD-10-CM | POA: Diagnosis not present

## 2019-01-16 DIAGNOSIS — Z7901 Long term (current) use of anticoagulants: Secondary | ICD-10-CM | POA: Diagnosis not present

## 2019-01-16 DIAGNOSIS — L89159 Pressure ulcer of sacral region, unspecified stage: Secondary | ICD-10-CM | POA: Diagnosis not present

## 2019-01-21 ENCOUNTER — Telehealth: Payer: Self-pay | Admitting: Gastroenterology

## 2019-01-22 MED ORDER — HYDROCORTISONE ACETATE 25 MG RE SUPP: RECTAL | 1 refills | Status: DC

## 2019-01-22 NOTE — Telephone Encounter (Signed)
Confirmed patient wanted Anucort suppositorys sent to pharmacy and patient notified prescription has been sent.

## 2019-01-22 NOTE — Telephone Encounter (Signed)
Left a message for patient to return my call. 

## 2019-01-23 ENCOUNTER — Other Ambulatory Visit: Payer: Self-pay | Admitting: Family Medicine

## 2019-01-23 DIAGNOSIS — M19012 Primary osteoarthritis, left shoulder: Secondary | ICD-10-CM

## 2019-01-23 DIAGNOSIS — M79672 Pain in left foot: Secondary | ICD-10-CM

## 2019-01-23 DIAGNOSIS — M169 Osteoarthritis of hip, unspecified: Secondary | ICD-10-CM

## 2019-01-23 DIAGNOSIS — M25559 Pain in unspecified hip: Secondary | ICD-10-CM

## 2019-01-23 MED ORDER — OXYCODONE-ACETAMINOPHEN 5-325 MG PO TABS: 1.0000 | ORAL_TABLET | Freq: Two times a day (BID) | ORAL | 0 refills | Status: DC

## 2019-01-23 NOTE — Telephone Encounter (Signed)
Rx last filled on 11/05/2018.

## 2019-01-23 NOTE — Telephone Encounter (Signed)
Copied from [place] [phone]. Topic: Quick Communication - Rx Refill/Question >> Jan 23, 2019 11:54 AM Sheran Luz wrote: Medication: oxyCODONE-acetaminophen (PERCOCET/ROXICET) 5-325 MG tablet   Patient is requesting refill of this medication.   Preferred Pharmacy (with phone number or street name):[facility] - [place], [place] [address] AT [place] [phone] (Phone) [phone] (Fax)

## 2019-01-23 NOTE — Telephone Encounter (Signed)
Copied from [place] [phone]. Topic: Quick Communication - Rx Refill/Question >> Jan 23, 2019  1:07 PM Rayann Heman wrote: Medication: oxyCODONE-acetaminophen (PERCOCET/ROXICET) 5-325 MG tablet [[id number]]   Has the patient contacted their pharmacy? no Preferred Pharmacy (with phone number or street name): [facility] [place], [place] AT [place] [phone] (Phone) [phone] (Fax)    Agent: Please be advised that RX refills may take up to 3 business days. We ask that you follow-up with your pharmacy.

## 2019-01-23 NOTE — Telephone Encounter (Signed)
Disregard other message, patient wants Rx sent to [facility] in [place]. Rx last filled in March of 2020.

## 2019-01-29 ENCOUNTER — Telehealth: Payer: Self-pay

## 2019-01-29 NOTE — Telephone Encounter (Signed)
LEft message with pts wife Clarise Cruz that pts appt will be change to video due to covid 19 pandemic.She stated to call pt in an hour at [phone].He will be with his nurse, and son to help him out.

## 2019-02-03 NOTE — Telephone Encounter (Signed)
Link text to sons cell phone for virtual video visit on 02/04/2019 at 0100pm.

## 2019-02-03 NOTE — Telephone Encounter (Signed)
I spoke with [name] that appt for pt will be a doxy video visit due to COVID 19. The son gave phone to pt and Mr.Briley Stetzer gave verbal consent to do video and to file insurance. I explain doxy video to caregiver. The son has a phone with a camera and t mobile is the carrier. I stated link will be text to sons phone at [phone]. I stated to click on link 10 minutes early for appt on 0100pm. The caregiver verbalized understanding and will let son know.

## 2019-02-04 ENCOUNTER — Ambulatory Visit (INDEPENDENT_AMBULATORY_CARE_PROVIDER_SITE_OTHER): Payer: Medicare Other | Admitting: Neurology

## 2019-02-04 ENCOUNTER — Other Ambulatory Visit: Payer: Self-pay

## 2019-02-04 DIAGNOSIS — I6300 Cerebral infarction due to thrombosis of unspecified precerebral artery: Secondary | ICD-10-CM | POA: Diagnosis not present

## 2019-02-04 DIAGNOSIS — I6990 Unspecified sequelae of unspecified cerebrovascular disease: Secondary | ICD-10-CM | POA: Diagnosis not present

## 2019-02-04 NOTE — Progress Notes (Signed)
Virtual Visit via Video Note  I connected with Brian [name] on 02/04/19 at  1:00 PM EDT by a video enabled telemedicine application and verified that I am speaking with the correct person using two identifiers.  Location: Patient: at home with son Provider: at [facility] office   I discussed the limitations of evaluation and management by telemedicine and the availability of in person appointments. The patient expressed understanding and agreed to proceed.  This meeting was performed using doxy.me app for audio and visual the patient's son was present throughout the meeting and facilitated it.  History of Present Illness: Brian [name] is seen today for virtual video follow-up visit following his last visit in the office in June 2019.  He states he is done well from a stroke standpoint and has not had any recurrent stroke or TIA symptoms.  He remains on apixaban which is tolerating well with only minor bruising and no major bleeding episodes.  He is also on pravastatin.  He is unable to tell me the last time lipid profile was checked.  The patient's main complaint is arthritis In his knees and shoulder which limits his mobility and he spends most of his time in a wheelchair.  He still likes to go to his furniture store 3 days a week.  His hearing is also gotten worse over the last 1 year.  He had follow-up lipid profile checked on 09/24/2018 which showed LDL cholesterol 57 mg percent.  His last carotid ultrasound was on 02/05/2018 which showed bilateral 1-39% stenosis. Observations/Objective: Physical and neurological exams are limited due to constraints from virtual video visit.  Pleasant elderly Caucasian male appears not to be in distress.  He is extremely hard of hearing.  Is awake alert and oriented to time place and person.  Speech and language appear normal.  Extraocular movements are full range without nystagmus.  Hearing is diminished bilaterally.  Face is symmetric without weakness.  Tongue is midline.   Motor system exam is limited but he appears to have mild left hand weakness.  He is unable to stand up and walk.  Gait not tested.  Assessment and Plan: [name] with embolic right basal ganglia infarct in September 2017 secondary to atrial fibrillation due to right middle cerebral artery occlusion treated with mechanical thrombectomy with complete recanalization and excellent clinical outcome.  He has residual mild left-sided weakness and gait and balance difficulties and he has functionally declined in the last 1 year and is mostly in a wheelchair due to worsening of his arthritis. PLAN : I advised the patient to exercise regularly to build up his muscles and encouraged him to walk with a walker and 1 person assist if possible.  I advised him to seek help from his primary care physician to control his arthritic pain better.  He will remain on Eliquis for stroke prevention and maintain aggressive risk factor modification with strict control of hypertension with blood pressure goal below 130/90, hemoglobin globin A1c goal below 6.5% and LDL goal below 70 mg percent.  I also encouraged him to eat a healthy diet.  Follow-up in the future with me only as necessary. Follow Up Instructions: Follow-up in the future with me only as necessary and no schedule appointment was made.   I discussed the assessment and treatment plan with the patient. The patient was provided an opportunity to ask questions and all were answered. The patient agreed with the plan and demonstrated an understanding of the instructions.  The patient was advised to call back or seek an in-person evaluation if the symptoms worsen or if the condition fails to improve as anticipated.  I provided 25 minutes of non-face-to-face time during this encounter.   Antony Contras, MD

## 2019-02-11 ENCOUNTER — Telehealth: Payer: Self-pay | Admitting: Family Medicine

## 2019-02-11 DIAGNOSIS — M5136 Other intervertebral disc degeneration, lumbar region: Secondary | ICD-10-CM | POA: Diagnosis not present

## 2019-02-11 DIAGNOSIS — M5416 Radiculopathy, lumbar region: Secondary | ICD-10-CM | POA: Diagnosis not present

## 2019-02-11 DIAGNOSIS — Z79891 Long term (current) use of opiate analgesic: Secondary | ICD-10-CM | POA: Diagnosis not present

## 2019-02-11 NOTE — Telephone Encounter (Signed)
Pt son lance called and stated that they would like PT for pt regarding left shoulder and both knees. Please advise

## 2019-02-15 DIAGNOSIS — Z0000 Encounter for general adult medical examination without abnormal findings: Secondary | ICD-10-CM | POA: Diagnosis not present

## 2019-02-15 DIAGNOSIS — F419 Anxiety disorder, unspecified: Secondary | ICD-10-CM | POA: Diagnosis not present

## 2019-02-15 DIAGNOSIS — L89159 Pressure ulcer of sacral region, unspecified stage: Secondary | ICD-10-CM | POA: Diagnosis not present

## 2019-02-15 DIAGNOSIS — Z7901 Long term (current) use of anticoagulants: Secondary | ICD-10-CM | POA: Diagnosis not present

## 2019-02-15 DIAGNOSIS — R6889 Other general symptoms and signs: Secondary | ICD-10-CM | POA: Diagnosis not present

## 2019-02-15 DIAGNOSIS — J302 Other seasonal allergic rhinitis: Secondary | ICD-10-CM | POA: Diagnosis not present

## 2019-02-22 DIAGNOSIS — Z7901 Long term (current) use of anticoagulants: Secondary | ICD-10-CM | POA: Diagnosis not present

## 2019-02-22 DIAGNOSIS — E7800 Pure hypercholesterolemia, unspecified: Secondary | ICD-10-CM | POA: Diagnosis not present

## 2019-02-22 DIAGNOSIS — K219 Gastro-esophageal reflux disease without esophagitis: Secondary | ICD-10-CM | POA: Diagnosis not present

## 2019-02-22 DIAGNOSIS — M1990 Unspecified osteoarthritis, unspecified site: Secondary | ICD-10-CM | POA: Diagnosis not present

## 2019-02-22 DIAGNOSIS — F329 Major depressive disorder, single episode, unspecified: Secondary | ICD-10-CM | POA: Diagnosis not present

## 2019-02-22 DIAGNOSIS — L89156 Pressure-induced deep tissue damage of sacral region: Secondary | ICD-10-CM | POA: Diagnosis not present

## 2019-02-22 DIAGNOSIS — F419 Anxiety disorder, unspecified: Secondary | ICD-10-CM | POA: Diagnosis not present

## 2019-02-22 DIAGNOSIS — I69354 Hemiplegia and hemiparesis following cerebral infarction affecting left non-dominant side: Secondary | ICD-10-CM | POA: Diagnosis not present

## 2019-02-22 DIAGNOSIS — I10 Essential (primary) hypertension: Secondary | ICD-10-CM | POA: Diagnosis not present

## 2019-02-22 DIAGNOSIS — J449 Chronic obstructive pulmonary disease, unspecified: Secondary | ICD-10-CM | POA: Diagnosis not present

## 2019-02-22 DIAGNOSIS — Z466 Encounter for fitting and adjustment of urinary device: Secondary | ICD-10-CM | POA: Diagnosis not present

## 2019-02-22 DIAGNOSIS — I4891 Unspecified atrial fibrillation: Secondary | ICD-10-CM | POA: Diagnosis not present

## 2019-02-22 DIAGNOSIS — Z9181 History of falling: Secondary | ICD-10-CM | POA: Diagnosis not present

## 2019-02-25 DIAGNOSIS — L89156 Pressure-induced deep tissue damage of sacral region: Secondary | ICD-10-CM | POA: Diagnosis not present

## 2019-02-25 DIAGNOSIS — J449 Chronic obstructive pulmonary disease, unspecified: Secondary | ICD-10-CM | POA: Diagnosis not present

## 2019-02-25 DIAGNOSIS — M1990 Unspecified osteoarthritis, unspecified site: Secondary | ICD-10-CM | POA: Diagnosis not present

## 2019-02-25 DIAGNOSIS — I4891 Unspecified atrial fibrillation: Secondary | ICD-10-CM | POA: Diagnosis not present

## 2019-02-25 DIAGNOSIS — I10 Essential (primary) hypertension: Secondary | ICD-10-CM | POA: Diagnosis not present

## 2019-02-25 DIAGNOSIS — I69354 Hemiplegia and hemiparesis following cerebral infarction affecting left non-dominant side: Secondary | ICD-10-CM | POA: Diagnosis not present

## 2019-02-27 DIAGNOSIS — I4891 Unspecified atrial fibrillation: Secondary | ICD-10-CM | POA: Diagnosis not present

## 2019-02-27 DIAGNOSIS — J449 Chronic obstructive pulmonary disease, unspecified: Secondary | ICD-10-CM | POA: Diagnosis not present

## 2019-02-27 DIAGNOSIS — I10 Essential (primary) hypertension: Secondary | ICD-10-CM | POA: Diagnosis not present

## 2019-02-27 DIAGNOSIS — I69354 Hemiplegia and hemiparesis following cerebral infarction affecting left non-dominant side: Secondary | ICD-10-CM | POA: Diagnosis not present

## 2019-02-27 DIAGNOSIS — L89156 Pressure-induced deep tissue damage of sacral region: Secondary | ICD-10-CM | POA: Diagnosis not present

## 2019-02-27 DIAGNOSIS — M1990 Unspecified osteoarthritis, unspecified site: Secondary | ICD-10-CM | POA: Diagnosis not present

## 2019-02-28 DIAGNOSIS — I10 Essential (primary) hypertension: Secondary | ICD-10-CM | POA: Diagnosis not present

## 2019-02-28 DIAGNOSIS — L89156 Pressure-induced deep tissue damage of sacral region: Secondary | ICD-10-CM | POA: Diagnosis not present

## 2019-02-28 DIAGNOSIS — I4891 Unspecified atrial fibrillation: Secondary | ICD-10-CM | POA: Diagnosis not present

## 2019-02-28 DIAGNOSIS — M1990 Unspecified osteoarthritis, unspecified site: Secondary | ICD-10-CM | POA: Diagnosis not present

## 2019-02-28 DIAGNOSIS — I69354 Hemiplegia and hemiparesis following cerebral infarction affecting left non-dominant side: Secondary | ICD-10-CM | POA: Diagnosis not present

## 2019-02-28 DIAGNOSIS — J449 Chronic obstructive pulmonary disease, unspecified: Secondary | ICD-10-CM | POA: Diagnosis not present

## 2019-03-03 DIAGNOSIS — I4891 Unspecified atrial fibrillation: Secondary | ICD-10-CM | POA: Diagnosis not present

## 2019-03-03 DIAGNOSIS — I10 Essential (primary) hypertension: Secondary | ICD-10-CM | POA: Diagnosis not present

## 2019-03-03 DIAGNOSIS — J449 Chronic obstructive pulmonary disease, unspecified: Secondary | ICD-10-CM | POA: Diagnosis not present

## 2019-03-03 DIAGNOSIS — M1990 Unspecified osteoarthritis, unspecified site: Secondary | ICD-10-CM | POA: Diagnosis not present

## 2019-03-03 DIAGNOSIS — L89156 Pressure-induced deep tissue damage of sacral region: Secondary | ICD-10-CM | POA: Diagnosis not present

## 2019-03-03 DIAGNOSIS — I69354 Hemiplegia and hemiparesis following cerebral infarction affecting left non-dominant side: Secondary | ICD-10-CM | POA: Diagnosis not present

## 2019-03-04 DIAGNOSIS — J449 Chronic obstructive pulmonary disease, unspecified: Secondary | ICD-10-CM | POA: Diagnosis not present

## 2019-03-04 DIAGNOSIS — M1990 Unspecified osteoarthritis, unspecified site: Secondary | ICD-10-CM | POA: Diagnosis not present

## 2019-03-04 DIAGNOSIS — I4891 Unspecified atrial fibrillation: Secondary | ICD-10-CM | POA: Diagnosis not present

## 2019-03-04 DIAGNOSIS — I69354 Hemiplegia and hemiparesis following cerebral infarction affecting left non-dominant side: Secondary | ICD-10-CM | POA: Diagnosis not present

## 2019-03-04 DIAGNOSIS — L89156 Pressure-induced deep tissue damage of sacral region: Secondary | ICD-10-CM | POA: Diagnosis not present

## 2019-03-04 DIAGNOSIS — I10 Essential (primary) hypertension: Secondary | ICD-10-CM | POA: Diagnosis not present

## 2019-03-05 DIAGNOSIS — L89156 Pressure-induced deep tissue damage of sacral region: Secondary | ICD-10-CM | POA: Diagnosis not present

## 2019-03-05 DIAGNOSIS — I10 Essential (primary) hypertension: Secondary | ICD-10-CM | POA: Diagnosis not present

## 2019-03-05 DIAGNOSIS — I4891 Unspecified atrial fibrillation: Secondary | ICD-10-CM | POA: Diagnosis not present

## 2019-03-05 DIAGNOSIS — J449 Chronic obstructive pulmonary disease, unspecified: Secondary | ICD-10-CM | POA: Diagnosis not present

## 2019-03-05 DIAGNOSIS — M1990 Unspecified osteoarthritis, unspecified site: Secondary | ICD-10-CM | POA: Diagnosis not present

## 2019-03-05 DIAGNOSIS — I69354 Hemiplegia and hemiparesis following cerebral infarction affecting left non-dominant side: Secondary | ICD-10-CM | POA: Diagnosis not present

## 2019-03-06 DIAGNOSIS — I4891 Unspecified atrial fibrillation: Secondary | ICD-10-CM | POA: Diagnosis not present

## 2019-03-06 DIAGNOSIS — I69354 Hemiplegia and hemiparesis following cerebral infarction affecting left non-dominant side: Secondary | ICD-10-CM | POA: Diagnosis not present

## 2019-03-06 DIAGNOSIS — J449 Chronic obstructive pulmonary disease, unspecified: Secondary | ICD-10-CM | POA: Diagnosis not present

## 2019-03-06 DIAGNOSIS — I10 Essential (primary) hypertension: Secondary | ICD-10-CM | POA: Diagnosis not present

## 2019-03-06 DIAGNOSIS — M1990 Unspecified osteoarthritis, unspecified site: Secondary | ICD-10-CM | POA: Diagnosis not present

## 2019-03-06 DIAGNOSIS — L89156 Pressure-induced deep tissue damage of sacral region: Secondary | ICD-10-CM | POA: Diagnosis not present

## 2019-03-11 ENCOUNTER — Ambulatory Visit: Payer: Medicare Other | Admitting: Family Medicine

## 2019-03-11 ENCOUNTER — Other Ambulatory Visit: Payer: Self-pay | Admitting: Gastroenterology

## 2019-03-11 DIAGNOSIS — M1990 Unspecified osteoarthritis, unspecified site: Secondary | ICD-10-CM | POA: Diagnosis not present

## 2019-03-11 DIAGNOSIS — I69354 Hemiplegia and hemiparesis following cerebral infarction affecting left non-dominant side: Secondary | ICD-10-CM | POA: Diagnosis not present

## 2019-03-11 DIAGNOSIS — J449 Chronic obstructive pulmonary disease, unspecified: Secondary | ICD-10-CM | POA: Diagnosis not present

## 2019-03-11 DIAGNOSIS — L89156 Pressure-induced deep tissue damage of sacral region: Secondary | ICD-10-CM | POA: Diagnosis not present

## 2019-03-11 DIAGNOSIS — I10 Essential (primary) hypertension: Secondary | ICD-10-CM | POA: Diagnosis not present

## 2019-03-11 DIAGNOSIS — I4891 Unspecified atrial fibrillation: Secondary | ICD-10-CM | POA: Diagnosis not present

## 2019-03-12 ENCOUNTER — Other Ambulatory Visit: Payer: Self-pay | Admitting: Family Medicine

## 2019-03-12 MED ORDER — FINASTERIDE 5 MG PO TABS: 5.0000 mg | ORAL_TABLET | Freq: Every day | ORAL | 3 refills | Status: DC

## 2019-03-12 NOTE — Telephone Encounter (Signed)
Rx sent in

## 2019-03-12 NOTE — Telephone Encounter (Signed)
Copied from [place] [phone]. Topic: Quick Communication - Rx Refill/Question >> Mar 12, 2019 10:03 AM Leward Quan A wrote: Medication: finasteride (PROSCAR) 5 MG tablet   Has the patient contacted their pharmacy? Yes.   (Agent: If no, request that the patient contact the pharmacy for the refill.) (Agent: If yes, when and what did the pharmacy advise?)  Preferred Pharmacy (with phone number or street name): [id number] - [place], [place] [address] AT [place] [phone] (Phone) [phone]     Agent: Please be advised that RX refills may take up to 3 business days. We ask that you follow-up with your pharmacy.

## 2019-03-13 DIAGNOSIS — L89156 Pressure-induced deep tissue damage of sacral region: Secondary | ICD-10-CM | POA: Diagnosis not present

## 2019-03-13 DIAGNOSIS — I10 Essential (primary) hypertension: Secondary | ICD-10-CM | POA: Diagnosis not present

## 2019-03-13 DIAGNOSIS — I4891 Unspecified atrial fibrillation: Secondary | ICD-10-CM | POA: Diagnosis not present

## 2019-03-13 DIAGNOSIS — M1990 Unspecified osteoarthritis, unspecified site: Secondary | ICD-10-CM | POA: Diagnosis not present

## 2019-03-13 DIAGNOSIS — I69354 Hemiplegia and hemiparesis following cerebral infarction affecting left non-dominant side: Secondary | ICD-10-CM | POA: Diagnosis not present

## 2019-03-13 DIAGNOSIS — J449 Chronic obstructive pulmonary disease, unspecified: Secondary | ICD-10-CM | POA: Diagnosis not present

## 2019-03-17 ENCOUNTER — Telehealth: Payer: Self-pay

## 2019-03-17 DIAGNOSIS — L89156 Pressure-induced deep tissue damage of sacral region: Secondary | ICD-10-CM | POA: Diagnosis not present

## 2019-03-17 DIAGNOSIS — I10 Essential (primary) hypertension: Secondary | ICD-10-CM | POA: Diagnosis not present

## 2019-03-17 DIAGNOSIS — J449 Chronic obstructive pulmonary disease, unspecified: Secondary | ICD-10-CM | POA: Diagnosis not present

## 2019-03-17 DIAGNOSIS — I4891 Unspecified atrial fibrillation: Secondary | ICD-10-CM | POA: Diagnosis not present

## 2019-03-17 DIAGNOSIS — I69354 Hemiplegia and hemiparesis following cerebral infarction affecting left non-dominant side: Secondary | ICD-10-CM | POA: Diagnosis not present

## 2019-03-17 DIAGNOSIS — M1990 Unspecified osteoarthritis, unspecified site: Secondary | ICD-10-CM | POA: Diagnosis not present

## 2019-03-17 NOTE — Telephone Encounter (Signed)

## 2019-03-18 ENCOUNTER — Encounter: Payer: Self-pay | Admitting: Family Medicine

## 2019-03-18 ENCOUNTER — Ambulatory Visit (INDEPENDENT_AMBULATORY_CARE_PROVIDER_SITE_OTHER): Payer: Medicare Other | Admitting: Family Medicine

## 2019-03-18 VITALS — BP 124/70 | HR 75 | Ht 66.0 in | Wt 174.0 lb

## 2019-03-18 DIAGNOSIS — I10 Essential (primary) hypertension: Secondary | ICD-10-CM | POA: Diagnosis not present

## 2019-03-18 DIAGNOSIS — M19012 Primary osteoarthritis, left shoulder: Secondary | ICD-10-CM | POA: Diagnosis not present

## 2019-03-18 DIAGNOSIS — I480 Paroxysmal atrial fibrillation: Secondary | ICD-10-CM

## 2019-03-18 DIAGNOSIS — M25559 Pain in unspecified hip: Secondary | ICD-10-CM | POA: Diagnosis not present

## 2019-03-18 DIAGNOSIS — E875 Hyperkalemia: Secondary | ICD-10-CM | POA: Diagnosis not present

## 2019-03-18 LAB — BASIC METABOLIC PANEL
BUN: 16 mg/dL (ref 6–23)
CO2: 31 mEq/L (ref 19–32)
Calcium: 9.5 mg/dL (ref 8.4–10.5)
Chloride: 101 mEq/L (ref 96–112)
Creatinine, Ser: 0.64 mg/dL (ref 0.40–1.50)
GFR: 119.09 mL/min (ref >60.00)
Glucose, Bld: 159 mg/dL — ABNORMAL HIGH (ref 70–99)
Potassium: 4.9 mEq/L (ref 3.5–5.1)
Sodium: 137 mEq/L (ref 135–145)

## 2019-03-18 LAB — CBC
HCT: 46 % (ref 39.0–52.0)
Hemoglobin: 15.5 g/dL (ref 13.0–17.0)
MCHC: 33.7 g/dL (ref 30.0–36.0)
MCV: 96.6 fl (ref 78.0–100.0)
Platelets: 231 10*3/uL (ref 150.0–400.0)
RBC: 4.76 Mil/uL (ref 4.22–5.81)
RDW: 14.5 % (ref 11.5–15.5)
WBC: 7 10*3/uL (ref 4.0–10.5)

## 2019-03-18 NOTE — Progress Notes (Signed)
Established Patient Office Visit  Subjective:  Patient ID: Brian [name], male    DOB: 02-Jun-1933  Age: 85 y.o. MRN: [id number]  CC:  Chief Complaint  Patient presents with  . Follow-up    HPI Brian [name] presents for follow-up of his hypertension and atrial fib.  Blood pressure and palpitations remain well controlled on metoprolol XL 25 mg taken daily.  No further choking episodes since medicines adjusted.  Continues to experience left shoulder and bilateral hip pain that has been treated with the Percocet.  Denies any current chest pain or shortness of breath.  Taking Xanax at night to help with sleep.  Past Medical History:  Diagnosis Date  . Anemia   . Atrial flutter ([place])   . BPH (benign prostatic hypertrophy)   . Epistaxis   . Hemorrhage of gastrointestinal tract, unspecified   . History of open heart surgery   . Hypertension   . Osteoarthrosis, unspecified whether generalized or localized, unspecified site   . Personal history of venous thrombosis and embolism   . Rosacea   . Stroke ([place])   . TIA (transient ischemic attack)     Past Surgical History:  Procedure Laterality Date  . APPENDECTOMY    . HERNIA REPAIR    . IR GENERIC HISTORICAL  05/04/2016   IR PERCUTANEOUS ART THROMBECTOMY/INFUSION INTRACRANIAL INC DIAG ANGIO 05/04/2016 [name], MD [facility]  . IR GENERIC HISTORICAL  05/04/2016   IR ANGIO VERTEBRAL SEL SUBCLAVIAN INNOMINATE UNI R MOD SED 05/04/2016 [name], MD [facility]  . IR GENERIC HISTORICAL  06/07/2016   IR RADIOLOGIST EVAL & MGMT 06/07/2016 [facility]  . JOINT REPLACEMENT    . MITRAL VALVE REPAIR    . mvp repair    . RADIOLOGY WITH ANESTHESIA N/A 05/04/2016   Procedure: RADIOLOGY WITH ANESTHESIA;  Surgeon: [name], MD;  Location: [facility];  Service: Radiology;  Laterality: N/A;  . TOTAL HIP ARTHROPLASTY      Family History  Problem Relation Age of Onset  . Rheumatic fever Mother   . Coronary artery  disease Father     Social History   Socioeconomic History  . Marital status: Married    Spouse name: Not on file  . Number of children: Not on file  . Years of education: Not on file  . Highest education level: Not on file  Occupational History  . Not on file  Social Needs  . Financial resource strain: Not on file  . Food insecurity    Worry: Not on file    Inability: Not on file  . Transportation needs    Medical: Not on file    Non-medical: Not on file  Tobacco Use  . Smoking status: Former Research scientist (life sciences)  . Smokeless tobacco: Never Used  . Tobacco comment: quit 50 yr ago  Substance and Sexual Activity  . Alcohol use: No  . Drug use: No  . Sexual activity: Not on file  Lifestyle  . Physical activity    Days per week: Not on file    Minutes per session: Not on file  . Stress: Not on file  Relationships  . Social Herbalist on phone: Not on file    Gets together: Not on file    Attends religious service: Not on file    Active member of club or organization: Not on file    Attends meetings of clubs or organizations: Not on file    Relationship status: Not  on file  . Intimate partner violence    Fear of current or ex partner: Not on file    Emotionally abused: Not on file    Physically abused: Not on file    Forced sexual activity: Not on file  Other Topics Concern  . Not on file  Social History Narrative  . Not on file    Outpatient Medications Prior to Visit  Medication Sig Dispense Refill  . ALPRAZolam (XANAX) 0.25 MG tablet Take 1 tablet (0.25 mg total) by mouth at bedtime. 90 tablet 0  . apixaban (ELIQUIS) 2.5 MG TABS tablet Take 1 tablet (2.5 mg total) by mouth 2 (two) times daily. 180 tablet 1  . cetirizine (ZYRTEC) 10 MG tablet Take 1 tablet (10 mg total) by mouth at bedtime. 90 tablet 2  . clindamycin (CLEOCIN) 150 MG capsule Take 3 capsules (450 mg total) by mouth 3 (three) times daily. Take three times daily with breakfast, lunch, and dinner. 56  capsule 0  . finasteride (PROSCAR) 5 MG tablet Take 1 tablet (5 mg total) by mouth daily. 90 tablet 3  . hydrocortisone (ANUCORT-HC) 25 MG suppository UNWRAP AND INSERT 1 SUPPOSITORY RECTALLY TWICE DAILY 30 suppository 1  . hydrocortisone (PROCTOZONE-HC) 2.5 % rectal cream USE RECTALLY TWICE DAILY 30 g 0  . hydrocortisone-pramoxine (ANALPRAM-HC) 2.5-1 % rectal cream Place 1 application rectally 2 (two) times daily as needed for hemorrhoids.     . metoprolol succinate (TOPROL-XL) 25 MG 24 hr tablet Take 1 tablet (25 mg total) by mouth daily. 90 tablet 3  . oxyCODONE-acetaminophen (PERCOCET/ROXICET) 5-325 MG tablet Take 1 tablet by mouth 2 (two) times daily. 180 tablet 0  . pantoprazole (PROTONIX) 40 MG tablet Take 1 tablet (40 mg total) by mouth daily. 90 tablet 3  . pravastatin (PRAVACHOL) 40 MG tablet Take 1 tablet (40 mg total) by mouth daily. 90 tablet 3  . sertraline (ZOLOFT) 50 MG tablet Take 1 tablet (50 mg total) by mouth every morning. 90 tablet 3   No facility-administered medications prior to visit.     Allergies  Allergen Reactions  . Novocain [Procaine Hcl]     Irregular heart beat     . Other     Patient had problem with epidural with his right hip surgery. Went to up extremity instead of lower extremity   . Penicillins Itching    Has patient had a PCN reaction causing immediate rash, facial/tongue/throat swelling, SOB or lightheadedness with hypotension: Unk Has patient had a PCN reaction causing severe rash involving mucus membranes or skin necrosis: Unk Has patient had a PCN reaction that required hospitalization: Unk Has patient had a PCN reaction occurring within the last 10 years: No If all of the above answers are "NO", then may proceed with Cephalosporin use.  No reaction noted    ROS Review of Systems  Constitutional: Negative.  Negative for diaphoresis, fatigue, fever and unexpected weight change.  HENT: Positive for postnasal drip.   Eyes: Negative for  photophobia and visual disturbance.  Respiratory: Negative.   Cardiovascular: Negative.   Gastrointestinal: Negative.   Endocrine: Negative for polyphagia and polyuria.  Musculoskeletal: Positive for arthralgias.  Skin: Negative for pallor and rash.  Allergic/Immunologic: Negative for immunocompromised state.  Neurological: Negative for syncope and speech difficulty.  Hematological: Bruises/bleeds easily.  Psychiatric/Behavioral: Negative.       Objective:    Physical Exam  Constitutional: He is oriented to person, place, and time. He appears well-developed and well-nourished. No distress.  HENT:  Head: Normocephalic and atraumatic.  Right Ear: External ear normal.  Left Ear: External ear normal.  Mouth/Throat: Oropharynx is clear and moist. No oropharyngeal exudate.  Eyes: Pupils are equal, round, and reactive to light. Conjunctivae are normal. Right eye exhibits no discharge. Left eye exhibits no discharge.  Neck: No JVD present. No tracheal deviation present. No thyromegaly present.  Cardiovascular: Normal rate, regular rhythm and normal heart sounds.  Pulmonary/Chest: Effort normal and breath sounds normal. No stridor.  Abdominal: Bowel sounds are normal.  Musculoskeletal:        General: No edema.  Lymphadenopathy:    He has no cervical adenopathy.  Neurological: He is alert and oriented to person, place, and time.  Skin: Skin is warm and dry. He is not diaphoretic.  Psychiatric: He has a normal mood and affect.    BP 124/70   Pulse 75   Ht 5\' 6"  (1.676 m)   SpO2 95%   BMI 26.63 kg/m  Wt Readings from Last 3 Encounters:  11/03/18 165 lb (74.8 kg)  09/24/18 143 lb (64.9 kg)  06/11/18 150 lb 6 oz (68.2 kg)   BP Readings from Last 3 Encounters:  03/18/19 124/70  11/05/18 100/60  11/03/18 (!) 82/42   Guideline developer:  UpToDate (see UpToDate for funding source) Date Released: June 2014  There are no preventive care reminders to display for this patient.   There are no preventive care reminders to display for this patient.  Lab Results  Component Value Date   TSH 2.99 09/26/2016   Lab Results  Component Value Date   WBC 11.0 (H) 11/03/2018   HGB 15.2 11/03/2018   HCT 47.3 11/03/2018   MCV 101.3 (H) 11/03/2018   PLT 255 11/03/2018   Lab Results  Component Value Date   NA 130 (L) 11/03/2018   K 3.8 11/03/2018   CO2 18 (L) 11/03/2018   GLUCOSE 169 (H) 11/03/2018   BUN 14 11/03/2018   CREATININE 0.82 11/03/2018   BILITOT 0.8 05/12/2018   ALKPHOS 73 05/12/2018   AST 25 05/12/2018   ALT 24 05/12/2018   PROT 6.4 (L) 05/12/2018   ALBUMIN 3.5 05/12/2018   CALCIUM 8.2 (L) 11/03/2018   ANIONGAP 13 11/03/2018   GFR 148.22 09/24/2018   Lab Results  Component Value Date   CHOL 134 04/16/2018   Lab Results  Component Value Date   HDL 51 04/16/2018   Lab Results  Component Value Date   LDLCALC 55 04/16/2018   Lab Results  Component Value Date   TRIG 139 04/16/2018   Lab Results  Component Value Date   CHOLHDL 2.6 04/16/2018   Lab Results  Component Value Date   HGBA1C 5.2 12/31/2017      Assessment & Plan:   Problem List Items Addressed This Visit      Cardiovascular and Mediastinum   Essential hypertension - Primary   Relevant Orders   Basic metabolic panel   CBC   Paroxysmal atrial fibrillation (HCC)   Relevant Orders   Basic metabolic panel   CBC     Musculoskeletal and Integument   Osteoarthritis of left glenohumeral joint     Other   Arthralgia of hip   Serum potassium elevated   Relevant Orders   Basic metabolic panel      No orders of the defined types were placed in this encounter.   Follow-up: Return in about 6 months (around 09/18/2019).

## 2019-03-20 ENCOUNTER — Telehealth: Payer: Self-pay

## 2019-03-20 DIAGNOSIS — J449 Chronic obstructive pulmonary disease, unspecified: Secondary | ICD-10-CM | POA: Diagnosis not present

## 2019-03-20 DIAGNOSIS — M1990 Unspecified osteoarthritis, unspecified site: Secondary | ICD-10-CM | POA: Diagnosis not present

## 2019-03-20 DIAGNOSIS — I69354 Hemiplegia and hemiparesis following cerebral infarction affecting left non-dominant side: Secondary | ICD-10-CM | POA: Diagnosis not present

## 2019-03-20 DIAGNOSIS — I4891 Unspecified atrial fibrillation: Secondary | ICD-10-CM | POA: Diagnosis not present

## 2019-03-20 DIAGNOSIS — L89156 Pressure-induced deep tissue damage of sacral region: Secondary | ICD-10-CM | POA: Diagnosis not present

## 2019-03-20 DIAGNOSIS — I10 Essential (primary) hypertension: Secondary | ICD-10-CM | POA: Diagnosis not present

## 2019-03-20 NOTE — Telephone Encounter (Signed)
Routed to wrong practice. Forwarding to [facility].

## 2019-03-20 NOTE — Telephone Encounter (Signed)
I left pt a voicemail letting him know he has not gained 10lbs in the last week. 3 months ago he was 164, the other day he was 174. I advised him it's been 10lbs within the past 3 months. Advised to call back with any further questions.

## 2019-03-20 NOTE — Telephone Encounter (Signed)
Copied from [place] [id number]. Topic: General - Other >> Mar 19, 2019 12:31 PM Keene Breath wrote: Reason for CRM: Patient called because he is concerned that he has gained 10 pounds in 1 week.  He would like a call back from the nurse or doctor.  CB# [phone]

## 2019-03-25 DIAGNOSIS — M25512 Pain in left shoulder: Secondary | ICD-10-CM | POA: Diagnosis not present

## 2019-03-25 DIAGNOSIS — M25511 Pain in right shoulder: Secondary | ICD-10-CM | POA: Diagnosis not present

## 2019-03-25 DIAGNOSIS — M1711 Unilateral primary osteoarthritis, right knee: Secondary | ICD-10-CM | POA: Insufficient documentation

## 2019-03-25 DIAGNOSIS — M170 Bilateral primary osteoarthritis of knee: Secondary | ICD-10-CM | POA: Diagnosis not present

## 2019-03-29 DIAGNOSIS — I4891 Unspecified atrial fibrillation: Secondary | ICD-10-CM | POA: Diagnosis not present

## 2019-03-29 DIAGNOSIS — Z7901 Long term (current) use of anticoagulants: Secondary | ICD-10-CM | POA: Diagnosis not present

## 2019-03-29 DIAGNOSIS — Z7189 Other specified counseling: Secondary | ICD-10-CM | POA: Diagnosis not present

## 2019-03-29 DIAGNOSIS — J069 Acute upper respiratory infection, unspecified: Secondary | ICD-10-CM | POA: Diagnosis not present

## 2019-03-29 DIAGNOSIS — I10 Essential (primary) hypertension: Secondary | ICD-10-CM | POA: Diagnosis not present

## 2019-04-01 ENCOUNTER — Ambulatory Visit: Payer: Medicare Other | Admitting: Family Medicine

## 2019-04-08 ENCOUNTER — Ambulatory Visit (INDEPENDENT_AMBULATORY_CARE_PROVIDER_SITE_OTHER): Payer: Medicare Other | Admitting: Family Medicine

## 2019-04-08 ENCOUNTER — Encounter: Payer: Self-pay | Admitting: Family Medicine

## 2019-04-08 VITALS — BP 120/64 | HR 56 | Ht 66.0 in

## 2019-04-08 DIAGNOSIS — R7309 Other abnormal glucose: Secondary | ICD-10-CM

## 2019-04-08 LAB — HEMOGLOBIN A1C: Hgb A1c MFr Bld: 4.9 % (ref 4.6–6.5)

## 2019-04-08 NOTE — Progress Notes (Signed)
Established Patient Office Visit  Subjective:  Patient ID: Brian [name], male    DOB: 08-19-1933  Age: 85 y.o. MRN: [id number]  CC:  Chief Complaint  Patient presents with  . Follow-up    HPI [name] presents for follow-up of his elevated glucoses noted on recent lab work.  No history of diabetes.  Patient is trying to lose some weight.  Denies frequency of urination.  Past Medical History:  Diagnosis Date  . Anemia   . Atrial flutter ([place])   . BPH (benign prostatic hypertrophy)   . Epistaxis   . Hemorrhage of gastrointestinal tract, unspecified   . History of open heart surgery   . Hypertension   . Osteoarthrosis, unspecified whether generalized or localized, unspecified site   . Personal history of venous thrombosis and embolism   . Rosacea   . Stroke ([place])   . TIA (transient ischemic attack)     Past Surgical History:  Procedure Laterality Date  . APPENDECTOMY    . HERNIA REPAIR    . IR GENERIC HISTORICAL  05/04/2016   IR PERCUTANEOUS ART THROMBECTOMY/INFUSION INTRACRANIAL INC DIAG ANGIO 05/04/2016 [name], MD [facility]  . IR GENERIC HISTORICAL  05/04/2016   IR ANGIO VERTEBRAL SEL SUBCLAVIAN INNOMINATE UNI R MOD SED 05/04/2016 [name], MD [facility]  . IR GENERIC HISTORICAL  06/07/2016   IR RADIOLOGIST EVAL & MGMT 06/07/2016 [facility]  . JOINT REPLACEMENT    . MITRAL VALVE REPAIR    . mvp repair    . RADIOLOGY WITH ANESTHESIA N/A 05/04/2016   Procedure: RADIOLOGY WITH ANESTHESIA;  Surgeon: [name], MD;  Location: [facility];  Service: Radiology;  Laterality: N/A;  . TOTAL HIP ARTHROPLASTY      Family History  Problem Relation Age of Onset  . Rheumatic fever Mother   . Coronary artery disease Father     Social History   Socioeconomic History  . Marital status: Married    Spouse name: Not on file  . Number of children: Not on file  . Years of education: Not on file  . Highest education level: Not on file   Occupational History  . Not on file  Social Needs  . Financial resource strain: Not on file  . Food insecurity    Worry: Not on file    Inability: Not on file  . Transportation needs    Medical: Not on file    Non-medical: Not on file  Tobacco Use  . Smoking status: Former Research scientist (life sciences)  . Smokeless tobacco: Never Used  . Tobacco comment: quit 50 yr ago  Substance and Sexual Activity  . Alcohol use: No  . Drug use: No  . Sexual activity: Not on file  Lifestyle  . Physical activity    Days per week: Not on file    Minutes per session: Not on file  . Stress: Not on file  Relationships  . Social Herbalist on phone: Not on file    Gets together: Not on file    Attends religious service: Not on file    Active member of club or organization: Not on file    Attends meetings of clubs or organizations: Not on file    Relationship status: Not on file  . Intimate partner violence    Fear of current or ex partner: Not on file    Emotionally abused: Not on file    Physically abused: Not on file  Forced sexual activity: Not on file  Other Topics Concern  . Not on file  Social History Narrative  . Not on file    Outpatient Medications Prior to Visit  Medication Sig Dispense Refill  . ALPRAZolam (XANAX) 0.25 MG tablet Take 1 tablet (0.25 mg total) by mouth at bedtime. 90 tablet 0  . apixaban (ELIQUIS) 2.5 MG TABS tablet Take 1 tablet (2.5 mg total) by mouth 2 (two) times daily. 180 tablet 1  . cetirizine (ZYRTEC) 10 MG tablet Take 1 tablet (10 mg total) by mouth at bedtime. 90 tablet 2  . clindamycin (CLEOCIN) 150 MG capsule Take 3 capsules (450 mg total) by mouth 3 (three) times daily. Take three times daily with breakfast, lunch, and dinner. 56 capsule 0  . finasteride (PROSCAR) 5 MG tablet Take 1 tablet (5 mg total) by mouth daily. 90 tablet 3  . hydrocortisone (ANUCORT-HC) 25 MG suppository UNWRAP AND INSERT 1 SUPPOSITORY RECTALLY TWICE DAILY 30 suppository 1  .  hydrocortisone (PROCTOZONE-HC) 2.5 % rectal cream USE RECTALLY TWICE DAILY 30 g 0  . hydrocortisone-pramoxine (ANALPRAM-HC) 2.5-1 % rectal cream Place 1 application rectally 2 (two) times daily as needed for hemorrhoids.     . metoprolol succinate (TOPROL-XL) 25 MG 24 hr tablet Take 1 tablet (25 mg total) by mouth daily. 90 tablet 3  . oxyCODONE-acetaminophen (PERCOCET/ROXICET) 5-325 MG tablet Take 1 tablet by mouth 2 (two) times daily. 180 tablet 0  . pantoprazole (PROTONIX) 40 MG tablet Take 1 tablet (40 mg total) by mouth daily. 90 tablet 3  . pravastatin (PRAVACHOL) 40 MG tablet Take 1 tablet (40 mg total) by mouth daily. 90 tablet 3  . sertraline (ZOLOFT) 50 MG tablet Take 1 tablet (50 mg total) by mouth every morning. 90 tablet 3   No facility-administered medications prior to visit.     Allergies  Allergen Reactions  . Novocain [Procaine Hcl]     Irregular heart beat     . Other     Patient had problem with epidural with his right hip surgery. Went to up extremity instead of lower extremity   . Penicillins Itching    Has patient had a PCN reaction causing immediate rash, facial/tongue/throat swelling, SOB or lightheadedness with hypotension: Unk Has patient had a PCN reaction causing severe rash involving mucus membranes or skin necrosis: Unk Has patient had a PCN reaction that required hospitalization: Unk Has patient had a PCN reaction occurring within the last 10 years: No If all of the above answers are "NO", then may proceed with Cephalosporin use.  No reaction noted    ROS Review of Systems  Constitutional: Negative.   Respiratory: Negative.   Cardiovascular: Negative.   Gastrointestinal: Negative.   Endocrine: Negative for polyphagia and polyuria.  Genitourinary: Negative for frequency.  Musculoskeletal: Positive for gait problem.  Neurological: Positive for weakness.  Psychiatric/Behavioral: Negative.       Objective:    Physical Exam  Constitutional: He  is oriented to person, place, and time. He appears well-developed and well-nourished. No distress.  HENT:  Head: Normocephalic and atraumatic.  Right Ear: External ear normal.  Left Ear: External ear normal.  Eyes: Conjunctivae are normal. Right eye exhibits no discharge. Left eye exhibits no discharge. No scleral icterus.  Neck: No JVD present. No tracheal deviation present.  Cardiovascular: Normal rate, regular rhythm and normal heart sounds.  Pulmonary/Chest: Effort normal and breath sounds normal. No stridor.  Neurological: He is alert and oriented to person, place, and  time.  Skin: He is not diaphoretic.  Psychiatric: He has a normal mood and affect. His behavior is normal.    BP 120/64   Pulse (!) 56   Ht 5\' 6"  (1.676 m)   SpO2 93%   BMI 28.08 kg/m  Wt Readings from Last 3 Encounters:  03/18/19 174 lb (78.9 kg)  11/03/18 165 lb (74.8 kg)  09/24/18 143 lb (64.9 kg)   BP Readings from Last 3 Encounters:  04/08/19 120/64  03/18/19 124/70  11/05/18 100/60   Guideline developer:  UpToDate (see UpToDate for funding source) Date Released: June 2014  Health Maintenance Due  Topic Date Due  . INFLUENZA VACCINE  03/22/2019    There are no preventive care reminders to display for this patient.  Lab Results  Component Value Date   TSH 2.99 09/26/2016   Lab Results  Component Value Date   WBC 7.0 03/18/2019   HGB 15.5 03/18/2019   HCT 46.0 03/18/2019   MCV 96.6 03/18/2019   PLT 231.0 03/18/2019   Lab Results  Component Value Date   NA 137 03/18/2019   K 4.9 03/18/2019   CO2 31 03/18/2019   GLUCOSE 159 (H) 03/18/2019   BUN 16 03/18/2019   CREATININE 0.64 03/18/2019   BILITOT 0.8 05/12/2018   ALKPHOS 73 05/12/2018   AST 25 05/12/2018   ALT 24 05/12/2018   PROT 6.4 (L) 05/12/2018   ALBUMIN 3.5 05/12/2018   CALCIUM 9.5 03/18/2019   ANIONGAP 13 11/03/2018   GFR 119.09 03/18/2019   Lab Results  Component Value Date   CHOL 134 04/16/2018   Lab Results   Component Value Date   HDL 51 04/16/2018   Lab Results  Component Value Date   LDLCALC 55 04/16/2018   Lab Results  Component Value Date   TRIG 139 04/16/2018   Lab Results  Component Value Date   CHOLHDL 2.6 04/16/2018   Lab Results  Component Value Date   HGBA1C 5.2 12/31/2017      Assessment & Plan:   Problem List Items Addressed This Visit      Other   Elevated glucose - Primary   Relevant Orders   Hemoglobin A1c      No orders of the defined types were placed in this encounter.   Follow-up: No follow-ups on file.

## 2019-04-15 DIAGNOSIS — M19012 Primary osteoarthritis, left shoulder: Secondary | ICD-10-CM | POA: Diagnosis not present

## 2019-04-15 DIAGNOSIS — M6281 Muscle weakness (generalized): Secondary | ICD-10-CM | POA: Diagnosis not present

## 2019-04-15 DIAGNOSIS — M25511 Pain in right shoulder: Secondary | ICD-10-CM | POA: Diagnosis not present

## 2019-04-15 DIAGNOSIS — J019 Acute sinusitis, unspecified: Secondary | ICD-10-CM | POA: Diagnosis not present

## 2019-04-15 DIAGNOSIS — E785 Hyperlipidemia, unspecified: Secondary | ICD-10-CM | POA: Diagnosis not present

## 2019-04-15 DIAGNOSIS — M25512 Pain in left shoulder: Secondary | ICD-10-CM | POA: Diagnosis not present

## 2019-04-25 ENCOUNTER — Encounter: Payer: Self-pay | Admitting: Cardiovascular Disease

## 2019-04-29 ENCOUNTER — Ambulatory Visit: Payer: Medicare Other | Admitting: Cardiovascular Disease

## 2019-04-29 NOTE — Progress Notes (Deleted)
Cardiology Office Note   Date:  04/29/2019   ID:  Brian Brian [name], Brian Brian [name] 11-12-1932, MRN [id number]  PCP:  Brian Brian [name], Brian Brian [name]  Cardiologist:   Brian Brian [name], Brian Brian [name]   No chief complaint on file.    History of Present Illness: Brian Brian [name] is a 85 y.o. male with paroxysmal atrial fibrillation, GI bleeds, prior stroke, hypertension, prior mitral vale repair who presents for follow up.  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  Brian [name]. INR was 2.8 on admission.  Brian [name] was switched to Eliquis 2.5 mg bid prior to discharge.  Brian Brian [name] had a gastric ulcer in 2002 and required transfusion at that time. This was subsequently treated and Brian [name] has not had any recurrent GI bleeding.  Brian Brian [name] had his mitral valve repaired at [facility] in 2002. Brian Brian [name] had an echo 05/06/16 that revealed LVEF 55-60% and mildly elevated pulmonary pressures.  Brian [name] had a [employer] 01/2004 that revealed LVEF 64% and a nonreversible inferior defect that was thought to be diaphragm attenuation versus infarct. Brian [name] had an echo 10/2009 with normal systolic function, mild aortic regurgitation and mild mitral regurgitation.  Brian [name] was last seen by Dr. Stanford [name] 01/2013 at which time Brian [name] was felt to be in atrial flutter. Brian [name] was well rate-controlled and was asymptomatic.   Brian Brian [name] has been well.  Brian [name] is concerned that Brian [name] may be gaining too much weight.  Brian [name] typically wears a medium but recently Brian [name] had to increase to an extra-large because his clothes are fitting too tight.  Brian [name] has no lower extremity edema, no orthopnea, and no PND.  Brian [name] has no chest pain or pressure.  Brian [name] also denies any exertional dyspnea.  Brian [name] has a hard time exercising much due to instability and left arm and shoulder pain.  Brian [name] is not able to get any regular exercise.  Brian [name] has not felt any palpitations, lightheadedness, or dizziness.  2 days ago Brian [name] had a  bowel movement and had to strain.  His caregiver told him that Brian [name] had a blood clot in his stool.  Brian [name] has not had any abdominal pain, nausea, or vomiting.  Brian [name] also denies hematemesis.   Past Medical History:  Diagnosis Date  . Anemia   . Atrial flutter ([place])   . BPH (benign prostatic hypertrophy)   . Epistaxis   . Hemorrhage of gastrointestinal tract, unspecified   . History of open heart surgery   . Hypertension   . Osteoarthrosis, unspecified whether generalized or localized, unspecified site   . Personal history of venous thrombosis and embolism   . Rosacea   . Stroke ([place])   . TIA (transient ischemic attack)     Past Surgical History:  Procedure Laterality Date  . APPENDECTOMY    . HERNIA REPAIR    . IR GENERIC HISTORICAL  05/04/2016   IR PERCUTANEOUS ART THROMBECTOMY/INFUSION INTRACRANIAL INC DIAG ANGIO 05/04/2016 Brian Brian [name], Brian Brian [name] [facility]  . IR GENERIC HISTORICAL  05/04/2016   IR ANGIO VERTEBRAL SEL SUBCLAVIAN INNOMINATE UNI R MOD SED 05/04/2016 Brian Brian [name], Brian Brian [name] [facility]  . IR GENERIC HISTORICAL  06/07/2016   IR RADIOLOGIST EVAL & MGMT 06/07/2016 [facility]  . JOINT REPLACEMENT    . MITRAL VALVE REPAIR    . mvp repair    . RADIOLOGY WITH ANESTHESIA N/A 05/04/2016   Procedure: RADIOLOGY WITH ANESTHESIA;  Surgeon: Brian Brian [name]  Brian Brian [name], Brian Brian [name];  Location: [place];  Service: Radiology;  Laterality: N/A;  . TOTAL HIP ARTHROPLASTY       Current Outpatient Medications  Medication Sig Dispense Refill  . ALPRAZolam (XANAX) 0.25 MG tablet Take 1 tablet (0.25 mg total) by mouth at bedtime. 90 tablet 0  . apixaban (ELIQUIS) 2.5 MG TABS tablet Take 1 tablet (2.5 mg total) by mouth 2 (two) times daily. 180 tablet 1  . cetirizine (ZYRTEC) 10 MG tablet Take 1 tablet (10 mg total) by mouth at bedtime. 90 tablet 2  . clindamycin (CLEOCIN) 150 MG capsule Take 3 capsules (450 mg total) by mouth 3 (three) times daily. Take three times daily with breakfast, lunch, and dinner. 56  capsule 0  . finasteride (PROSCAR) 5 MG tablet Take 1 tablet (5 mg total) by mouth daily. 90 tablet 3  . hydrocortisone (ANUCORT-HC) 25 MG suppository UNWRAP AND INSERT 1 SUPPOSITORY RECTALLY TWICE DAILY 30 suppository 1  . hydrocortisone (PROCTOZONE-HC) 2.5 % rectal cream USE RECTALLY TWICE DAILY 30 g 0  . hydrocortisone-pramoxine (ANALPRAM-HC) 2.5-1 % rectal cream Place 1 application rectally 2 (two) times daily as needed for hemorrhoids.     . metoprolol succinate (TOPROL-XL) 25 MG 24 hr tablet Take 1 tablet (25 mg total) by mouth daily. 90 tablet 3  . oxyCODONE-acetaminophen (PERCOCET/ROXICET) 5-325 MG tablet Take 1 tablet by mouth 2 (two) times daily. 180 tablet 0  . pantoprazole (PROTONIX) 40 MG tablet Take 1 tablet (40 mg total) by mouth daily. 90 tablet 3  . pravastatin (PRAVACHOL) 40 MG tablet Take 1 tablet (40 mg total) by mouth daily. 90 tablet 3  . sertraline (ZOLOFT) 50 MG tablet Take 1 tablet (50 mg total) by mouth every morning. 90 tablet 3   No current facility-administered medications for this visit.     Allergies:   Novocain [procaine hcl], Other, and Penicillins    Social History:  The patient  reports that Brian [name] has quit smoking. Brian [name] has never used smokeless tobacco. Brian [name] reports that Brian [name] does not drink alcohol or use drugs.   Family History:  The patient's family history includes Coronary artery disease in his father; Rheumatic fever in his mother.    ROS:  Please see the history of present illness.   Otherwise, review of systems are positive for none.   All other systems are reviewed and negative.    PHYSICAL EXAM: VS:  There were no vitals taken for this visit. , BMI There is no height or weight on file to calculate BMI. GENERAL:  Well appearing.  No acute distress HEENT:  Pupils equal round and reactive, fundi not visualized, oral mucosa unremarkable NECK:  No jugular venous distention, waveform within normal limits, carotid upstroke brisk and symmetric, no bruits  LUNGS:  Clear to auscultation bilaterally.  No crackles, rhonchi, or wheezes. HEART:  Regular rate and rhythm.  PMI not displaced or sustained,S1 and S2 within normal limits, no S3, no S4, no clicks, no rubs, III/VI holosystolic murmur at the apex ABD:  Flat, positive bowel sounds normal in frequency in pitch, no bruits, no rebound, no guarding, no midline pulsatile mass, no hepatomegaly, no splenomegaly EXT:  2 plus pulses throughout, no edema, no cyanosis no clubbing SKIN:  No rashes no nodules NEURO:  Cranial nerves II through XII grossly intact, motor grossly intact throughout PSYCH:  Cognitively intact, oriented to person place and time  EKG:  EKG is ordered today. 07/04/16:  Atrial fibrillation. Rate 76 bpm. Left anterior fascicular block.  Left ventricular hypertrophy with repolarization abnormalities. 04/23/18: Sinus rhythm.  Rate 86 bpm. LVH with repolarization abnormalities.  Echo 05/06/16: Study Conclusions  - Left ventricle: The cavity size was normal. Wall thickness was   normal. Systolic function was normal. The estimated ejection   fraction was in the range of 55% to 60%. Wall motion was normal;   there were no regional wall motion abnormalities. Mitral   annuloplasty precludes evaluation of LV diastolic function. - Ventricular septum: Septal motion showed paradox. These changes   are consistent with a post-thoracotomy state. - Mitral valve: Prior procedures included surgical repair. An   annular ring prosthesis was present and functioning normally.   Valve area by pressure half-time: 2.47 cm^2. - Left atrium: The atrium was mildly dilated. - Pulmonary arteries: Systolic pressure was mildly increased. PA   peak pressure: 44 mm Hg (S).  Recent Labs: 05/12/2018: ALT 24 03/18/2019: BUN 16; Creatinine, Ser 0.64; Hemoglobin 15.5; Platelets 231.0; Potassium 4.9; Sodium 137    Lipid Panel    Component Value Date/Time   CHOL 134 04/16/2018 1155   TRIG 139 04/16/2018 1155    HDL 51 04/16/2018 1155   CHOLHDL 2.6 04/16/2018 1155   CHOLHDL 3.4 10/03/2016 1157   VLDL 22 10/03/2016 1157   LDLCALC 55 04/16/2018 1155   LDLDIRECT 57.0 09/24/2018 1506      Wt Readings from Last 3 Encounters:  03/18/19 174 lb (78.9 kg)  11/03/18 165 lb (74.8 kg)  09/24/18 143 lb (64.9 kg)      ASSESSMENT AND PLAN:  # Persistent atrial fibrillation:  # Prior embolic stroke/hemorrhagic: Mr. Aikens is in sinus rhythm today.  Brian [name] doesn't feel any different in atrial fibrillation. His Eliquis is underdosed due to history of GI bleed, ICH, low weight (67kg) and age >12.  Rates are well-controlled on metoprolol. This patients CHA2DS2-VASc Score and unadjusted Ischemic Stroke Rate (% per year) is equal to 7.2 % stroke rate/year from a score of 5 Above score calculated as 1 point each if present [CHF, HTN, DM, Vascular=MI/PAD/Aortic Plaque, Age if 65-74, or Male] Above score calculated as 2 points each if present [Age > 75, or Stroke/TIA/TE]  # Melena: Mr. Radice had some bleeding after straining for a bowel movement.  Brian [name] states it was a clot which seems to be less likely coming from his hemorrhoids.  Unless Brian [name] had clotted blood in the rectal vault from a prior bleed.  We will check a CBC.  Brian [name] does not appear anemic on exam today.  Continue Eliquis for now.  # Hypertension: Blood pressure is well-controlled on metoprolol and benazepril.  # Mitral valve repair: Valve stable on echo 04/2016.  # Hyperlipidemia: LDL 55 on 03/2018.  Continue pravastatin.    Current medicines are reviewed at length with the patient today.  The patient does not have concerns regarding medicines.  The following changes have been made:  no change  Labs/ tests ordered today include:   No orders of the defined types were placed in this encounter.   Disposition:   FU with Brian Ammon C. Oval Linsey, Brian Brian [name], [facility] in 6 months    Signed, Brian Yontz C. Oval Linsey, Brian Brian [name], [place]  04/29/2019 1:21 PM    [facility]

## 2019-04-30 ENCOUNTER — Other Ambulatory Visit: Payer: Self-pay

## 2019-04-30 ENCOUNTER — Ambulatory Visit (INDEPENDENT_AMBULATORY_CARE_PROVIDER_SITE_OTHER): Payer: Medicare Other | Admitting: Nurse Practitioner

## 2019-04-30 ENCOUNTER — Telehealth: Payer: Self-pay | Admitting: Family Medicine

## 2019-04-30 ENCOUNTER — Encounter: Payer: Self-pay | Admitting: Nurse Practitioner

## 2019-04-30 VITALS — BP 148/82 | HR 88 | Temp 98.0°F | Ht 66.0 in | Wt 149.0 lb

## 2019-04-30 DIAGNOSIS — M169 Osteoarthritis of hip, unspecified: Secondary | ICD-10-CM

## 2019-04-30 DIAGNOSIS — J069 Acute upper respiratory infection, unspecified: Secondary | ICD-10-CM

## 2019-04-30 DIAGNOSIS — R6889 Other general symptoms and signs: Secondary | ICD-10-CM | POA: Diagnosis not present

## 2019-04-30 DIAGNOSIS — M25559 Pain in unspecified hip: Secondary | ICD-10-CM

## 2019-04-30 DIAGNOSIS — M19012 Primary osteoarthritis, left shoulder: Secondary | ICD-10-CM

## 2019-04-30 DIAGNOSIS — Z20822 Contact with and (suspected) exposure to covid-19: Secondary | ICD-10-CM

## 2019-04-30 DIAGNOSIS — M79672 Pain in left foot: Secondary | ICD-10-CM

## 2019-04-30 NOTE — Progress Notes (Signed)
Subjective:  Patient ID: Brian [name], male    DOB: June 14, 1933  Age: 85 y.o. MRN: [id number]  CC: Ear Pain (pt is c/o left ear pain,throat sore and sinus pressure/2 days/no otc. )  URI  This is a new problem. The current episode started yesterday. The problem has been unchanged. There has been no fever. Associated symptoms include congestion, ear pain, sinus pain and a sore throat. Pertinent negatives include no coughing, headaches, joint pain, joint swelling, nausea, neck pain, plugged ear sensation, rash, sneezing, swollen glands, vomiting or wheezing. He has tried nothing for the symptoms.   Accompanied by adult son today.  Reviewed past Medical, Social and Family history today.  Outpatient Medications Prior to Visit  Medication Sig Dispense Refill  . ALPRAZolam (XANAX) 0.25 MG tablet Take 1 tablet (0.25 mg total) by mouth at bedtime. 90 tablet 0  . apixaban (ELIQUIS) 2.5 MG TABS tablet Take 1 tablet (2.5 mg total) by mouth 2 (two) times daily. 180 tablet 1  . cetirizine (ZYRTEC) 10 MG tablet Take 1 tablet (10 mg total) by mouth at bedtime. 90 tablet 2  . clindamycin (CLEOCIN) 150 MG capsule Take 3 capsules (450 mg total) by mouth 3 (three) times daily. Take three times daily with breakfast, lunch, and dinner. 56 capsule 0  . finasteride (PROSCAR) 5 MG tablet Take 1 tablet (5 mg total) by mouth daily. 90 tablet 3  . hydrocortisone (ANUCORT-HC) 25 MG suppository UNWRAP AND INSERT 1 SUPPOSITORY RECTALLY TWICE DAILY 30 suppository 1  . hydrocortisone (PROCTOZONE-HC) 2.5 % rectal cream USE RECTALLY TWICE DAILY 30 g 0  . hydrocortisone-pramoxine (ANALPRAM-HC) 2.5-1 % rectal cream Place 1 application rectally 2 (two) times daily as needed for hemorrhoids.     . metoprolol succinate (TOPROL-XL) 25 MG 24 hr tablet Take 1 tablet (25 mg total) by mouth daily. 90 tablet 3  . pantoprazole (PROTONIX) 40 MG tablet Take 1 tablet (40 mg total) by mouth daily. 90 tablet 3  . pravastatin (PRAVACHOL) 40  MG tablet Take 1 tablet (40 mg total) by mouth daily. 90 tablet 3  . sertraline (ZOLOFT) 50 MG tablet Take 1 tablet (50 mg total) by mouth every morning. 90 tablet 3  . oxyCODONE-acetaminophen (PERCOCET/ROXICET) 5-325 MG tablet Take 1 tablet by mouth 2 (two) times daily. 180 tablet 0   No facility-administered medications prior to visit.     ROS See HPI  Objective:  BP (!) 148/82   Pulse 88   Temp 98 F (36.7 C) (Tympanic)   Ht 5\' 6"  (1.676 m)   Wt 149 lb (67.6 kg)   SpO2 95%   BMI 24.05 kg/m   BP Readings from Last 3 Encounters:  04/30/19 (!) 148/82  04/08/19 120/64  03/18/19 124/70    Wt Readings from Last 3 Encounters:  04/30/19 149 lb (67.6 kg)  03/18/19 174 lb (78.9 kg)  11/03/18 165 lb (74.8 kg)    Physical Exam Vitals signs reviewed.  HENT:     Right Ear: Tympanic membrane, ear canal and external ear normal.     Left Ear: Tympanic membrane, ear canal and external ear normal.     Nose: Nose normal.     Mouth/Throat:     Pharynx: No posterior oropharyngeal erythema.  Neck:     Musculoskeletal: Normal range of motion and neck supple.  Cardiovascular:     Pulses: Normal pulses.     Heart sounds: Normal heart sounds.  Pulmonary:     Effort: Pulmonary effort is  normal.     Breath sounds: Normal breath sounds.  Lymphadenopathy:     Cervical: No cervical adenopathy.  Neurological:     Mental Status: He is alert and oriented to person, place, and time.     Lab Results  Component Value Date   WBC 7.0 03/18/2019   HGB 15.5 03/18/2019   HCT 46.0 03/18/2019   PLT 231.0 03/18/2019   GLUCOSE 159 (H) 03/18/2019   CHOL 134 04/16/2018   TRIG 139 04/16/2018   HDL 51 04/16/2018   LDLDIRECT 57.0 09/24/2018   LDLCALC 55 04/16/2018   ALT 24 05/12/2018   AST 25 05/12/2018   NA 137 03/18/2019   K 4.9 03/18/2019   CL 101 03/18/2019   CREATININE 0.64 03/18/2019   BUN 16 03/18/2019   CO2 31 03/18/2019   TSH 2.99 09/26/2016   PSA 1.92 06/03/2012   INR 1.28  05/08/2016   HGBA1C 4.9 04/08/2019    Dg Chest 2 View  Result Date: 11/03/2018 CLINICAL DATA:  Choked earlier.  Unresponsive. EXAM: CHEST - 2 VIEW COMPARISON:  05/12/2018 FINDINGS: Cardiomegaly. Prior median sternotomy and valve replacement. Mild vascular congestion. Bilateral lower lobe airspace opacities are noted, left greater than right. This could reflect edema or infection. Aspiration is not excluded. No visible effusions or acute bony abnormality. IMPRESSION: Cardiomegaly, vascular congestion. Bilateral lower lobe airspace opacities, left greater than right. Edema versus infection. Aspiration not excluded. Electronically Signed   By: Rolm Baptise M.D.   On: 11/03/2018 20:38    Assessment & Plan:   Paisley was seen today for ear pain.  Diagnoses and all orders for this visit:  Upper respiratory tract infection, unspecified type -     Cancel: Novel Coronavirus, NAA ([facility])   I am having [name] maintain his hydrocortisone-pramoxine, clindamycin, apixaban, cetirizine, metoprolol succinate, pantoprazole, pravastatin, sertraline, hydrocortisone, ALPRAZolam, hydrocortisone, and finasteride.  No orders of the defined types were placed in this encounter.   Problem List Items Addressed This Visit    None    Visit Diagnoses    Upper respiratory tract infection, unspecified type    -  Primary       Follow-up: No follow-ups on file.  Wilfred Lacy, NP

## 2019-04-30 NOTE — Telephone Encounter (Signed)
Pt is requesting 5-325 mg refill for oxycodone. Please advise.

## 2019-04-30 NOTE — Patient Instructions (Addendum)
Go to [facility] for COVID test. Need to be at testing site no later than 3:30pm  Ok to use chloraseptic spray or lorenzes to soothe throat. May also use tylenol 500mg  every 6hrs as needed for pain.  This information is directly available on the CDC website: RunningShows.co.za.html    Source:CDC Reference to specific commercial products, manufacturers, companies, or trademarks does not constitute its endorsement or recommendation by the [place], [place], or Centers for [employer] and Prevention.

## 2019-05-01 ENCOUNTER — Encounter: Payer: Self-pay | Admitting: Nurse Practitioner

## 2019-05-01 MED ORDER — OXYCODONE-ACETAMINOPHEN 5-325 MG PO TABS: 1.0000 | ORAL_TABLET | Freq: Two times a day (BID) | ORAL | 0 refills | Status: DC

## 2019-05-02 LAB — NOVEL CORONAVIRUS, NAA: SARS-CoV-2, NAA: NOT DETECTED

## 2019-05-12 DIAGNOSIS — Z23 Encounter for immunization: Secondary | ICD-10-CM | POA: Diagnosis not present

## 2019-05-19 ENCOUNTER — Other Ambulatory Visit: Payer: Self-pay | Admitting: Gastroenterology

## 2019-05-20 ENCOUNTER — Ambulatory Visit (INDEPENDENT_AMBULATORY_CARE_PROVIDER_SITE_OTHER): Payer: Medicare Other | Admitting: Family Medicine

## 2019-05-20 ENCOUNTER — Encounter: Payer: Self-pay | Admitting: Family Medicine

## 2019-05-20 ENCOUNTER — Other Ambulatory Visit: Payer: Self-pay

## 2019-05-20 VITALS — BP 125/78 | HR 67 | Ht 66.0 in | Wt 149.0 lb

## 2019-05-20 DIAGNOSIS — L89301 Pressure ulcer of unspecified buttock, stage 1: Secondary | ICD-10-CM

## 2019-05-20 DIAGNOSIS — H9202 Otalgia, left ear: Secondary | ICD-10-CM | POA: Insufficient documentation

## 2019-05-20 DIAGNOSIS — R5381 Other malaise: Secondary | ICD-10-CM

## 2019-05-20 DIAGNOSIS — L89311 Pressure ulcer of right buttock, stage 1: Secondary | ICD-10-CM | POA: Insufficient documentation

## 2019-05-20 MED ORDER — SILVASORB EX GEL: CUTANEOUS | 0 refills | Status: DC

## 2019-05-20 NOTE — Progress Notes (Signed)
Established Patient Office Visit  Subjective:  Patient ID: Brian [name], male    DOB: 1933/04/10  Age: 85 y.o. MRN: [id number]  CC:  Chief Complaint  Patient presents with  . Follow-up    HPI Brian [name] presents for evaluation of a possible pressure sore in the buttock area.  Patient is debilitated and confined to a wheelchair.  Patient son is been applying a cream to this area.  He is not certain what this cream is.  He continues to work with physical therapy.  He complains of a sore and scratchy throat and left ear.  There is been no fever cough headache nausea or vomiting.  Past Medical History:  Diagnosis Date  . Anemia   . Atrial flutter ([place])   . BPH (benign prostatic hypertrophy)   . Epistaxis   . Hemorrhage of gastrointestinal tract, unspecified   . History of open heart surgery   . Hypertension   . Osteoarthrosis, unspecified whether generalized or localized, unspecified site   . Personal history of venous thrombosis and embolism   . Rosacea   . Stroke ([place])   . TIA (transient ischemic attack)     Past Surgical History:  Procedure Laterality Date  . APPENDECTOMY    . HERNIA REPAIR    . IR GENERIC HISTORICAL  05/04/2016   IR PERCUTANEOUS ART THROMBECTOMY/INFUSION INTRACRANIAL INC DIAG ANGIO 05/04/2016 [name], MD [facility]  . IR GENERIC HISTORICAL  05/04/2016   IR ANGIO VERTEBRAL SEL SUBCLAVIAN INNOMINATE UNI R MOD SED 05/04/2016 [name], MD [facility]  . IR GENERIC HISTORICAL  06/07/2016   IR RADIOLOGIST EVAL & MGMT 06/07/2016 [facility]  . JOINT REPLACEMENT    . MITRAL VALVE REPAIR    . mvp repair    . RADIOLOGY WITH ANESTHESIA N/A 05/04/2016   Procedure: RADIOLOGY WITH ANESTHESIA;  Surgeon: [name], MD;  Location: [facility];  Service: Radiology;  Laterality: N/A;  . TOTAL HIP ARTHROPLASTY      Family History  Problem Relation Age of Onset  . Rheumatic fever Mother   . Coronary artery disease Father     Social  History   Socioeconomic History  . Marital status: Married    Spouse name: Not on file  . Number of children: Not on file  . Years of education: Not on file  . Highest education level: Not on file  Occupational History  . Not on file  Social Needs  . Financial resource strain: Not on file  . Food insecurity    Worry: Not on file    Inability: Not on file  . Transportation needs    Medical: Not on file    Non-medical: Not on file  Tobacco Use  . Smoking status: Former Research scientist (life sciences)  . Smokeless tobacco: Never Used  . Tobacco comment: quit 50 yr ago  Substance and Sexual Activity  . Alcohol use: No  . Drug use: No  . Sexual activity: Not on file  Lifestyle  . Physical activity    Days per week: Not on file    Minutes per session: Not on file  . Stress: Not on file  Relationships  . Social Herbalist on phone: Not on file    Gets together: Not on file    Attends religious service: Not on file    Active member of club or organization: Not on file    Attends meetings of clubs or organizations: Not on file  Relationship status: Not on file  . Intimate partner violence    Fear of current or ex partner: Not on file    Emotionally abused: Not on file    Physically abused: Not on file    Forced sexual activity: Not on file  Other Topics Concern  . Not on file  Social History Narrative  . Not on file    Outpatient Medications Prior to Visit  Medication Sig Dispense Refill  . ALPRAZolam (XANAX) 0.25 MG tablet Take 1 tablet (0.25 mg total) by mouth at bedtime. 90 tablet 0  . apixaban (ELIQUIS) 2.5 MG TABS tablet Take 1 tablet (2.5 mg total) by mouth 2 (two) times daily. 180 tablet 1  . cetirizine (ZYRTEC) 10 MG tablet Take 1 tablet (10 mg total) by mouth at bedtime. 90 tablet 2  . clindamycin (CLEOCIN) 150 MG capsule Take 3 capsules (450 mg total) by mouth 3 (three) times daily. Take three times daily with breakfast, lunch, and dinner. 56 capsule 0  . finasteride  (PROSCAR) 5 MG tablet Take 1 tablet (5 mg total) by mouth daily. 90 tablet 3  . hydrocortisone (ANUCORT-HC) 25 MG suppository NEEDS APPT UNWRAP AND INSERT 1 SUPPOSITORY RECTALLY TWICE DAILY 30 suppository 0  . hydrocortisone (PROCTOZONE-HC) 2.5 % rectal cream USE RECTALLY TWICE DAILY 30 g 0  . hydrocortisone-pramoxine (ANALPRAM-HC) 2.5-1 % rectal cream Place 1 application rectally 2 (two) times daily as needed for hemorrhoids.     . metoprolol succinate (TOPROL-XL) 25 MG 24 hr tablet Take 1 tablet (25 mg total) by mouth daily. 90 tablet 3  . oxyCODONE-acetaminophen (PERCOCET/ROXICET) 5-325 MG tablet Take 1 tablet by mouth 2 (two) times daily. 180 tablet 0  . pantoprazole (PROTONIX) 40 MG tablet Take 1 tablet (40 mg total) by mouth daily. 90 tablet 3  . pravastatin (PRAVACHOL) 40 MG tablet Take 1 tablet (40 mg total) by mouth daily. 90 tablet 3  . sertraline (ZOLOFT) 50 MG tablet Take 1 tablet (50 mg total) by mouth every morning. 90 tablet 3   No facility-administered medications prior to visit.     Allergies  Allergen Reactions  . Novocain [Procaine Hcl]     Irregular heart beat     . Other     Patient had problem with epidural with his right hip surgery. Went to up extremity instead of lower extremity   . Penicillins Itching    Has patient had a PCN reaction causing immediate rash, facial/tongue/throat swelling, SOB or lightheadedness with hypotension: Unk Has patient had a PCN reaction causing severe rash involving mucus membranes or skin necrosis: Unk Has patient had a PCN reaction that required hospitalization: Unk Has patient had a PCN reaction occurring within the last 10 years: No If all of the above answers are "NO", then may proceed with Cephalosporin use.  No reaction noted    ROS Review of Systems  Constitutional: Negative for diaphoresis, fatigue, fever and unexpected weight change.  HENT: Positive for ear pain and sore throat. Negative for congestion, hearing loss,  tinnitus, trouble swallowing and voice change.   Eyes: Negative for photophobia and visual disturbance.  Respiratory: Negative for cough, shortness of breath and wheezing.   Cardiovascular: Negative for chest pain and palpitations.  Gastrointestinal: Negative for nausea and vomiting.  Endocrine: Negative for polyphagia and polyuria.  Skin: Positive for color change, rash and wound.  Allergic/Immunologic: Negative for immunocompromised state.  Neurological: Positive for weakness. Negative for speech difficulty.  Hematological: Does not bruise/bleed easily.  Psychiatric/Behavioral:  Negative.       Objective:    Physical Exam  Constitutional: He is oriented to person, place, and time. He appears well-developed and well-nourished. No distress.  HENT:  Head: Normocephalic and atraumatic.  Right Ear: Tympanic membrane, external ear and ear canal normal.  Left Ear: Tympanic membrane, external ear and ear canal normal.  Mouth/Throat: Oropharynx is clear and moist and mucous membranes are normal. No oropharyngeal exudate, posterior oropharyngeal edema or posterior oropharyngeal erythema.  Eyes: Conjunctivae are normal. Right eye exhibits no discharge. Left eye exhibits no discharge. No scleral icterus.  Neck: No JVD present. No tracheal deviation present. No thyromegaly present.  Cardiovascular: Normal rate, regular rhythm and normal heart sounds.  Pulmonary/Chest: Effort normal and breath sounds normal. No stridor. No respiratory distress. He has no wheezes. He has no rales.  Genitourinary: Right testis shows no swelling and no tenderness. Right testis is descended. Left testis shows no swelling and no tenderness. Left testis is descended. Uncircumcised. No penile erythema. No discharge found.  Lymphadenopathy:    He has no cervical adenopathy.       Right: No inguinal adenopathy present.       Left: No inguinal adenopathy present.  Neurological: He is alert and oriented to person, place, and  time.  Skin: Skin is warm and dry. He is not diaphoretic.     Psychiatric: He has a normal mood and affect. His behavior is normal.    BP 125/78   Pulse 67   Ht 5\' 6"  (1.676 m)   Wt 149 lb (67.6 kg)   SpO2 96%   BMI 24.05 kg/m  Wt Readings from Last 3 Encounters:  05/20/19 149 lb (67.6 kg)  04/30/19 149 lb (67.6 kg)  03/18/19 174 lb (78.9 kg)   BP Readings from Last 3 Encounters:  05/20/19 125/78  04/30/19 (!) 148/82  04/08/19 120/64   Guideline developer:  UpToDate (see UpToDate for funding source) Date Released: June 2014  Health Maintenance Due  Topic Date Due  . INFLUENZA VACCINE  03/22/2019    There are no preventive care reminders to display for this patient.  Lab Results  Component Value Date   TSH 2.99 09/26/2016   Lab Results  Component Value Date   WBC 7.0 03/18/2019   HGB 15.5 03/18/2019   HCT 46.0 03/18/2019   MCV 96.6 03/18/2019   PLT 231.0 03/18/2019   Lab Results  Component Value Date   NA 137 03/18/2019   K 4.9 03/18/2019   CO2 31 03/18/2019   GLUCOSE 159 (H) 03/18/2019   BUN 16 03/18/2019   CREATININE 0.64 03/18/2019   BILITOT 0.8 05/12/2018   ALKPHOS 73 05/12/2018   AST 25 05/12/2018   ALT 24 05/12/2018   PROT 6.4 (L) 05/12/2018   ALBUMIN 3.5 05/12/2018   CALCIUM 9.5 03/18/2019   ANIONGAP 13 11/03/2018   GFR 119.09 03/18/2019   Lab Results  Component Value Date   CHOL 134 04/16/2018   Lab Results  Component Value Date   HDL 51 04/16/2018   Lab Results  Component Value Date   LDLCALC 55 04/16/2018   Lab Results  Component Value Date   TRIG 139 04/16/2018   Lab Results  Component Value Date   CHOLHDL 2.6 04/16/2018   Lab Results  Component Value Date   HGBA1C 4.9 04/08/2019      Assessment & Plan:   Problem List Items Addressed This Visit      Musculoskeletal and Integument   Pressure  injury of buttock, stage 1 - Primary   Relevant Medications   Silver (SILVASORB) GEL   Other Relevant Orders   AMB  referral to wound care center   Ambulatory referral to [facility]     Other   Left ear pain   Debilitated      Meds ordered this encounter  Medications  . Silver (SILVASORB) GEL    Sig: Apply daily to wound to keep moist.    Dispense:  44.4 mL    Refill:  0    Follow-up: Return in about 3 months (around 08/19/2019).   We will apply silver sorb gel daily to help keep the wound moist.  Visiting home health to help with application and ideas for relieving pressure on the wound.

## 2019-05-20 NOTE — Patient Instructions (Signed)
Pressure Injury  A pressure injury is damage to the skin and underlying tissue that results from pressure being applied to an area of the body. It often affects people who must spend a long time in a bed or chair because of a medical condition. Pressure injuries usually occur:  Over bony parts of the body, such as the tailbone, shoulders, elbows, hips, heels, spine, ankles, and back of the head.  Under medical devices that make contact with the body, such as respiratory equipment, stockings, tubes, and splints. Pressure injuries start as reddened areas on the skin and can lead to pain and an open wound. What are the causes? This condition is caused by frequent or constant pressure to an area of the body. Decreased blood flow to the skin can eventually cause the skin tissue to die and break down, causing a wound. What increases the risk? You are more likely to develop this condition if you:  Are in the hospital or an extended care facility.  Are bedridden or in a wheelchair.  Have an injury or disease that keeps you from: ? Moving normally. ? Feeling pain or pressure.  Have a condition that: ? Makes you sleepy or less alert. ? Causes poor blood flow.  Need to wear a medical device.  Have poor control of your bladder or bowel functions (incontinence).  Have poor nutrition (malnutrition). If you are at risk for pressure injuries, your health care provider may recommend certain types of mattresses, mattress covers, pillows, cushions, or boots to help prevent them. These may include products filled with air, foam, gel, or sand. What are the signs or symptoms? Symptoms of this condition depend on the severity of the injury. Symptoms may include:  Red or dark areas of the skin.  Pain, warmth, or a change of skin texture.  Blisters.  An open wound. How is this diagnosed? This condition is diagnosed with a medical history and physical exam. You may also have tests, such as:  Blood  tests.  Imaging tests.  Blood flow tests. Your pressure injury will be staged based on its severity. Staging is based on:  The depth of the tissue injury, including whether there is exposure of muscle, bone, or tendon.  The cause of the pressure injury. How is this treated? This condition may be treated by:  Relieving or redistributing pressure on your skin. This includes: ? Frequently changing your position. ? Avoiding positions that caused the wound or that can make the wound worse. ? Using specific bed mattresses, chair cushions, or protective boots. ? Moving medical devices from an area of pressure, or placing padding between the skin and the device. ? Using foams, creams, or powders to prevent rubbing (friction) on the skin.  Keeping your skin clean and dry. This may include using a skin cleanser or skin barrier as told by your health care provider.  Cleaning your injury and removing any dead tissue from the wound (debridement).  Placing a bandage (dressing) over your injury.  Using medicines for pain or to prevent or treat infection. Surgery may be needed if other treatments are not working or if your injury is very deep. Follow these instructions at home: Wound care  Follow instructions from your health care provider about how to take care of your wound. Make sure you: ? Wash your hands with soap and water before and after you change your bandage (dressing). If soap and water are not available, use hand sanitizer. ? Change your dressing as told   by your health care provider.  Check your wound every day for signs of infection. Have a caregiver do this for you if you are not able. Check for: ? Redness, swelling, or increased pain. ? More fluid or blood. ? Warmth. ? Pus or a bad smell. Skin care  Keep your skin clean and dry. Gently pat your skin dry.  Do not rub or massage your skin.  You or a caregiver should check your skin every day for any changes in color or  any new blisters or sores (ulcers). Medicines  Take over-the-counter and prescription medicines only as told by your health care provider.  If you were prescribed an antibiotic medicine, take or apply it as told by your health care provider. Do not stop using the antibiotic even if your condition improves. Reducing and redistributing pressure  Do not lie or sit in one position for a long time. Move or change position every 1-2 hours, or as told by your health care provider.  Use pillows or cushions to reduce pressure. Ask your health care provider to recommend cushions or pads for you. General instructions   Eat a healthy diet that includes lots of protein.  Drink enough fluid to keep your urine pale yellow.  Be as active as you can every day. Ask your health care provider to suggest safe exercises or activities.  Do not abuse drugs or alcohol.  Do not use any products that contain nicotine or tobacco, such as cigarettes, e-cigarettes, and chewing tobacco. If you need help quitting, ask your health care provider.  Keep all follow-up visits as told by your health care provider. This is important. Contact a health care provider if:  You have: ? A fever or chills. ? Pain that is not helped by medicine. ? Any changes in skin color. ? New blisters or sores. ? Pus or a bad smell coming from your wound. ? Redness, swelling, or pain around your wound. ? More fluid or blood coming from your wound.  Your wound does not improve after 1-2 weeks of treatment. Summary  A pressure injury is damage to the skin and underlying tissue that results from pressure being applied to an area of the body.  Do not lie or sit in one position for a long time. Your health care provider may advise you to move or change position every 1-2 hours.  Follow instructions from your health care provider about how to take care of your wound.  Keep all follow-up visits as told by your health care provider. This  is important. This information is not intended to replace advice given to you by your health care provider. Make sure you discuss any questions you have with your health care provider. Document Released: 08/07/2005 Document Revised: 03/06/2018 Document Reviewed: 03/06/2018 Elsevier Patient Education  2020 [employer]  

## 2019-05-22 ENCOUNTER — Telehealth: Payer: Self-pay | Admitting: Family Medicine

## 2019-05-22 NOTE — Telephone Encounter (Signed)
Okay 

## 2019-05-22 NOTE — Telephone Encounter (Signed)
Verbal given 

## 2019-05-22 NOTE — Telephone Encounter (Signed)
Home Health Verbal Orders  [facility] Number: [phone] ext [phone] Requesting OT/PT/Skilled Nursing/Social Work/Speech Therapy: PT Frequency: Eval

## 2019-05-23 DIAGNOSIS — Z7901 Long term (current) use of anticoagulants: Secondary | ICD-10-CM | POA: Diagnosis not present

## 2019-05-23 DIAGNOSIS — Z993 Dependence on wheelchair: Secondary | ICD-10-CM | POA: Diagnosis not present

## 2019-05-23 DIAGNOSIS — M150 Primary generalized (osteo)arthritis: Secondary | ICD-10-CM | POA: Diagnosis not present

## 2019-05-23 DIAGNOSIS — L89311 Pressure ulcer of right buttock, stage 1: Secondary | ICD-10-CM | POA: Diagnosis not present

## 2019-05-23 DIAGNOSIS — L89309 Pressure ulcer of unspecified buttock, unspecified stage: Secondary | ICD-10-CM | POA: Diagnosis not present

## 2019-05-23 DIAGNOSIS — L89152 Pressure ulcer of sacral region, stage 2: Secondary | ICD-10-CM | POA: Diagnosis not present

## 2019-05-23 DIAGNOSIS — M6281 Muscle weakness (generalized): Secondary | ICD-10-CM | POA: Diagnosis not present

## 2019-05-23 DIAGNOSIS — Z951 Presence of aortocoronary bypass graft: Secondary | ICD-10-CM | POA: Diagnosis not present

## 2019-05-23 DIAGNOSIS — Z96649 Presence of unspecified artificial hip joint: Secondary | ICD-10-CM | POA: Diagnosis not present

## 2019-05-23 DIAGNOSIS — I4892 Unspecified atrial flutter: Secondary | ICD-10-CM | POA: Diagnosis not present

## 2019-05-23 DIAGNOSIS — Z87891 Personal history of nicotine dependence: Secondary | ICD-10-CM | POA: Diagnosis not present

## 2019-05-23 DIAGNOSIS — D649 Anemia, unspecified: Secondary | ICD-10-CM | POA: Diagnosis not present

## 2019-05-23 DIAGNOSIS — I10 Essential (primary) hypertension: Secondary | ICD-10-CM | POA: Diagnosis not present

## 2019-05-23 DIAGNOSIS — N400 Enlarged prostate without lower urinary tract symptoms: Secondary | ICD-10-CM | POA: Diagnosis not present

## 2019-05-23 DIAGNOSIS — Z86711 Personal history of pulmonary embolism: Secondary | ICD-10-CM | POA: Diagnosis not present

## 2019-05-23 DIAGNOSIS — H6692 Otitis media, unspecified, left ear: Secondary | ICD-10-CM | POA: Diagnosis not present

## 2019-05-23 DIAGNOSIS — Z86718 Personal history of other venous thrombosis and embolism: Secondary | ICD-10-CM | POA: Diagnosis not present

## 2019-05-26 ENCOUNTER — Telehealth: Payer: Self-pay | Admitting: Family Medicine

## 2019-05-26 NOTE — Telephone Encounter (Signed)
Home Health Verbal Orders - Caller/Agency: Mo with [facility] Number: [phone], OK to leave a message Requesting OT/PT/Skilled Nursing/Social Work/Speech Therapy: skilled nursing Frequency: 1 week 9 for wound care

## 2019-05-27 ENCOUNTER — Telehealth: Payer: Self-pay | Admitting: Family Medicine

## 2019-05-27 DIAGNOSIS — D649 Anemia, unspecified: Secondary | ICD-10-CM | POA: Diagnosis not present

## 2019-05-27 DIAGNOSIS — L89152 Pressure ulcer of sacral region, stage 2: Secondary | ICD-10-CM | POA: Diagnosis not present

## 2019-05-27 DIAGNOSIS — I10 Essential (primary) hypertension: Secondary | ICD-10-CM | POA: Diagnosis not present

## 2019-05-27 DIAGNOSIS — I4892 Unspecified atrial flutter: Secondary | ICD-10-CM | POA: Diagnosis not present

## 2019-05-27 DIAGNOSIS — N400 Enlarged prostate without lower urinary tract symptoms: Secondary | ICD-10-CM | POA: Diagnosis not present

## 2019-05-27 DIAGNOSIS — M150 Primary generalized (osteo)arthritis: Secondary | ICD-10-CM | POA: Diagnosis not present

## 2019-05-27 NOTE — Telephone Encounter (Signed)
I left Brian [name] a voicemail letting her know that the verbal orders have been approved.

## 2019-05-27 NOTE — Telephone Encounter (Signed)
Agreed -

## 2019-05-27 NOTE — Telephone Encounter (Signed)
Wandra Mannan, OT from [facility]  Requesting verbals  2x 3weeks 1x last week Call back [phone]

## 2019-05-28 NOTE — Telephone Encounter (Signed)
Okay for PT

## 2019-05-28 NOTE — Telephone Encounter (Signed)
Verbal given to Dozier.

## 2019-05-29 DIAGNOSIS — N400 Enlarged prostate without lower urinary tract symptoms: Secondary | ICD-10-CM | POA: Diagnosis not present

## 2019-05-29 DIAGNOSIS — M150 Primary generalized (osteo)arthritis: Secondary | ICD-10-CM | POA: Diagnosis not present

## 2019-05-29 DIAGNOSIS — I10 Essential (primary) hypertension: Secondary | ICD-10-CM | POA: Diagnosis not present

## 2019-05-29 DIAGNOSIS — L89152 Pressure ulcer of sacral region, stage 2: Secondary | ICD-10-CM | POA: Diagnosis not present

## 2019-05-29 DIAGNOSIS — D649 Anemia, unspecified: Secondary | ICD-10-CM | POA: Diagnosis not present

## 2019-05-29 DIAGNOSIS — I4892 Unspecified atrial flutter: Secondary | ICD-10-CM | POA: Diagnosis not present

## 2019-06-02 DIAGNOSIS — D649 Anemia, unspecified: Secondary | ICD-10-CM | POA: Diagnosis not present

## 2019-06-02 DIAGNOSIS — N400 Enlarged prostate without lower urinary tract symptoms: Secondary | ICD-10-CM | POA: Diagnosis not present

## 2019-06-02 DIAGNOSIS — I10 Essential (primary) hypertension: Secondary | ICD-10-CM | POA: Diagnosis not present

## 2019-06-02 DIAGNOSIS — M150 Primary generalized (osteo)arthritis: Secondary | ICD-10-CM | POA: Diagnosis not present

## 2019-06-02 DIAGNOSIS — L89152 Pressure ulcer of sacral region, stage 2: Secondary | ICD-10-CM | POA: Diagnosis not present

## 2019-06-02 DIAGNOSIS — I4892 Unspecified atrial flutter: Secondary | ICD-10-CM | POA: Diagnosis not present

## 2019-06-03 ENCOUNTER — Ambulatory Visit: Payer: Medicare Other | Admitting: Physician Assistant

## 2019-06-05 ENCOUNTER — Telehealth: Payer: Self-pay | Admitting: Family Medicine

## 2019-06-05 DIAGNOSIS — M150 Primary generalized (osteo)arthritis: Secondary | ICD-10-CM | POA: Diagnosis not present

## 2019-06-05 DIAGNOSIS — I4892 Unspecified atrial flutter: Secondary | ICD-10-CM | POA: Diagnosis not present

## 2019-06-05 DIAGNOSIS — D649 Anemia, unspecified: Secondary | ICD-10-CM | POA: Diagnosis not present

## 2019-06-05 DIAGNOSIS — N400 Enlarged prostate without lower urinary tract symptoms: Secondary | ICD-10-CM | POA: Diagnosis not present

## 2019-06-05 DIAGNOSIS — L89152 Pressure ulcer of sacral region, stage 2: Secondary | ICD-10-CM | POA: Diagnosis not present

## 2019-06-05 DIAGNOSIS — I10 Essential (primary) hypertension: Secondary | ICD-10-CM | POA: Diagnosis not present

## 2019-06-05 NOTE — Telephone Encounter (Signed)
Can we get him set up for a virtual visit tomorrow?

## 2019-06-05 NOTE — Telephone Encounter (Signed)
Done

## 2019-06-05 NOTE — Telephone Encounter (Signed)
Pt wants to know if dr thinks Enbrel would help for his arthritis? If so, pt would like a virtual visit to discuss with Dr Ethelene Hal.  Advised pt usually dr will want a visit to discuss a new medication that may be prescribed.

## 2019-06-06 ENCOUNTER — Encounter: Payer: Medicare Other | Admitting: Family Medicine

## 2019-06-06 ENCOUNTER — Encounter: Payer: Self-pay | Admitting: Family Medicine

## 2019-06-06 ENCOUNTER — Other Ambulatory Visit: Payer: Self-pay

## 2019-06-06 DIAGNOSIS — I4892 Unspecified atrial flutter: Secondary | ICD-10-CM | POA: Diagnosis not present

## 2019-06-06 DIAGNOSIS — L89152 Pressure ulcer of sacral region, stage 2: Secondary | ICD-10-CM | POA: Diagnosis not present

## 2019-06-06 DIAGNOSIS — N400 Enlarged prostate without lower urinary tract symptoms: Secondary | ICD-10-CM | POA: Diagnosis not present

## 2019-06-06 DIAGNOSIS — D649 Anemia, unspecified: Secondary | ICD-10-CM | POA: Diagnosis not present

## 2019-06-06 DIAGNOSIS — M150 Primary generalized (osteo)arthritis: Secondary | ICD-10-CM | POA: Diagnosis not present

## 2019-06-06 DIAGNOSIS — I10 Essential (primary) hypertension: Secondary | ICD-10-CM | POA: Diagnosis not present

## 2019-06-06 NOTE — Progress Notes (Deleted)
Established Patient Office Visit  Subjective:  Patient ID: Brian [name], male    DOB: 1933-07-23  Age: 85 y.o. MRN: [id number]  CC: No chief complaint on file.   HPI AMARIEN DEARMAN presents for ***  Past Medical History:  Diagnosis Date  . Anemia   . Atrial flutter ([place])   . BPH (benign prostatic hypertrophy)   . Epistaxis   . Hemorrhage of gastrointestinal tract, unspecified   . History of open heart surgery   . Hypertension   . Osteoarthrosis, unspecified whether generalized or localized, unspecified site   . Personal history of venous thrombosis and embolism   . Rosacea   . Stroke ([place])   . TIA (transient ischemic attack)     Past Surgical History:  Procedure Laterality Date  . APPENDECTOMY    . HERNIA REPAIR    . IR GENERIC HISTORICAL  05/04/2016   IR PERCUTANEOUS ART THROMBECTOMY/INFUSION INTRACRANIAL INC DIAG ANGIO 05/04/2016 [name], MD [facility]  . IR GENERIC HISTORICAL  05/04/2016   IR ANGIO VERTEBRAL SEL SUBCLAVIAN INNOMINATE UNI R MOD SED 05/04/2016 [name], MD [facility]  . IR GENERIC HISTORICAL  06/07/2016   IR RADIOLOGIST EVAL & MGMT 06/07/2016 [facility]  . JOINT REPLACEMENT    . MITRAL VALVE REPAIR    . mvp repair    . RADIOLOGY WITH ANESTHESIA N/A 05/04/2016   Procedure: RADIOLOGY WITH ANESTHESIA;  Surgeon: [name], MD;  Location: [facility];  Service: Radiology;  Laterality: N/A;  . TOTAL HIP ARTHROPLASTY      Family History  Problem Relation Age of Onset  . Rheumatic fever Mother   . Coronary artery disease Father     Social History   Socioeconomic History  . Marital status: Married    Spouse name: Not on file  . Number of children: Not on file  . Years of education: Not on file  . Highest education level: Not on file  Occupational History  . Not on file  Social Needs  . Financial resource strain: Not on file  . Food insecurity    Worry: Not on file    Inability: Not on file  . Transportation needs     Medical: Not on file    Non-medical: Not on file  Tobacco Use  . Smoking status: Former Research scientist (life sciences)  . Smokeless tobacco: Never Used  . Tobacco comment: quit 50 yr ago  Substance and Sexual Activity  . Alcohol use: No  . Drug use: No  . Sexual activity: Not on file  Lifestyle  . Physical activity    Days per week: Not on file    Minutes per session: Not on file  . Stress: Not on file  Relationships  . Social Herbalist on phone: Not on file    Gets together: Not on file    Attends religious service: Not on file    Active member of club or organization: Not on file    Attends meetings of clubs or organizations: Not on file    Relationship status: Not on file  . Intimate partner violence    Fear of current or ex partner: Not on file    Emotionally abused: Not on file    Physically abused: Not on file    Forced sexual activity: Not on file  Other Topics Concern  . Not on file  Social History Narrative  . Not on file    Outpatient Medications Prior to Visit  Medication Sig Dispense Refill  . ALPRAZolam (XANAX) 0.25 MG tablet Take 1 tablet (0.25 mg total) by mouth at bedtime. 90 tablet 0  . apixaban (ELIQUIS) 2.5 MG TABS tablet Take 1 tablet (2.5 mg total) by mouth 2 (two) times daily. 180 tablet 1  . cetirizine (ZYRTEC) 10 MG tablet Take 1 tablet (10 mg total) by mouth at bedtime. 90 tablet 2  . clindamycin (CLEOCIN) 150 MG capsule Take 3 capsules (450 mg total) by mouth 3 (three) times daily. Take three times daily with breakfast, lunch, and dinner. 56 capsule 0  . finasteride (PROSCAR) 5 MG tablet Take 1 tablet (5 mg total) by mouth daily. 90 tablet 3  . hydrocortisone (ANUCORT-HC) 25 MG suppository NEEDS APPT UNWRAP AND INSERT 1 SUPPOSITORY RECTALLY TWICE DAILY 30 suppository 0  . hydrocortisone (PROCTOZONE-HC) 2.5 % rectal cream USE RECTALLY TWICE DAILY 30 g 0  . hydrocortisone-pramoxine (ANALPRAM-HC) 2.5-1 % rectal cream Place 1 application rectally 2 (two) times  daily as needed for hemorrhoids.     . metoprolol succinate (TOPROL-XL) 25 MG 24 hr tablet Take 1 tablet (25 mg total) by mouth daily. 90 tablet 3  . oxyCODONE-acetaminophen (PERCOCET/ROXICET) 5-325 MG tablet Take 1 tablet by mouth 2 (two) times daily. 180 tablet 0  . pantoprazole (PROTONIX) 40 MG tablet Take 1 tablet (40 mg total) by mouth daily. 90 tablet 3  . pravastatin (PRAVACHOL) 40 MG tablet Take 1 tablet (40 mg total) by mouth daily. 90 tablet 3  . sertraline (ZOLOFT) 50 MG tablet Take 1 tablet (50 mg total) by mouth every morning. 90 tablet 3  . Silver (SILVASORB) GEL Apply daily to wound to keep moist. 44.4 mL 0   No facility-administered medications prior to visit.     Allergies  Allergen Reactions  . Novocain [Procaine Hcl]     Irregular heart beat     . Other     Patient had problem with epidural with his right hip surgery. Went to up extremity instead of lower extremity   . Penicillins Itching    Has patient had a PCN reaction causing immediate rash, facial/tongue/throat swelling, SOB or lightheadedness with hypotension: Unk Has patient had a PCN reaction causing severe rash involving mucus membranes or skin necrosis: Unk Has patient had a PCN reaction that required hospitalization: Unk Has patient had a PCN reaction occurring within the last 10 years: No If all of the above answers are "NO", then may proceed with Cephalosporin use.  No reaction noted    ROS Review of Systems    Objective:    Physical Exam  There were no vitals taken for this visit. Wt Readings from Last 3 Encounters:  05/20/19 149 lb (67.6 kg)  04/30/19 149 lb (67.6 kg)  03/18/19 174 lb (78.9 kg)   BP Readings from Last 3 Encounters:  05/20/19 125/78  04/30/19 (!) 148/82  04/08/19 120/64   Guideline developer:  UpToDate (see UpToDate for funding source) Date Released: June 2014  There are no preventive care reminders to display for this patient.  There are no preventive care  reminders to display for this patient.  Lab Results  Component Value Date   TSH 2.99 09/26/2016   Lab Results  Component Value Date   WBC 7.0 03/18/2019   HGB 15.5 03/18/2019   HCT 46.0 03/18/2019   MCV 96.6 03/18/2019   PLT 231.0 03/18/2019   Lab Results  Component Value Date   NA 137 03/18/2019   K 4.9 03/18/2019  CO2 31 03/18/2019   GLUCOSE 159 (H) 03/18/2019   BUN 16 03/18/2019   CREATININE 0.64 03/18/2019   BILITOT 0.8 05/12/2018   ALKPHOS 73 05/12/2018   AST 25 05/12/2018   ALT 24 05/12/2018   PROT 6.4 (L) 05/12/2018   ALBUMIN 3.5 05/12/2018   CALCIUM 9.5 03/18/2019   ANIONGAP 13 11/03/2018   GFR 119.09 03/18/2019   Lab Results  Component Value Date   CHOL 134 04/16/2018   Lab Results  Component Value Date   HDL 51 04/16/2018   Lab Results  Component Value Date   LDLCALC 55 04/16/2018   Lab Results  Component Value Date   TRIG 139 04/16/2018   Lab Results  Component Value Date   CHOLHDL 2.6 04/16/2018   Lab Results  Component Value Date   HGBA1C 4.9 04/08/2019      Assessment & Plan:   Problem List Items Addressed This Visit    None      No orders of the defined types were placed in this encounter.   Follow-up: No follow-ups on file.

## 2019-06-10 DIAGNOSIS — N400 Enlarged prostate without lower urinary tract symptoms: Secondary | ICD-10-CM | POA: Diagnosis not present

## 2019-06-10 DIAGNOSIS — I10 Essential (primary) hypertension: Secondary | ICD-10-CM | POA: Diagnosis not present

## 2019-06-10 DIAGNOSIS — M150 Primary generalized (osteo)arthritis: Secondary | ICD-10-CM | POA: Diagnosis not present

## 2019-06-10 DIAGNOSIS — L89152 Pressure ulcer of sacral region, stage 2: Secondary | ICD-10-CM | POA: Diagnosis not present

## 2019-06-10 DIAGNOSIS — D649 Anemia, unspecified: Secondary | ICD-10-CM | POA: Diagnosis not present

## 2019-06-10 DIAGNOSIS — I4892 Unspecified atrial flutter: Secondary | ICD-10-CM | POA: Diagnosis not present

## 2019-06-12 DIAGNOSIS — M150 Primary generalized (osteo)arthritis: Secondary | ICD-10-CM | POA: Diagnosis not present

## 2019-06-12 DIAGNOSIS — I10 Essential (primary) hypertension: Secondary | ICD-10-CM | POA: Diagnosis not present

## 2019-06-12 DIAGNOSIS — L89152 Pressure ulcer of sacral region, stage 2: Secondary | ICD-10-CM | POA: Diagnosis not present

## 2019-06-12 DIAGNOSIS — I4892 Unspecified atrial flutter: Secondary | ICD-10-CM | POA: Diagnosis not present

## 2019-06-12 DIAGNOSIS — D649 Anemia, unspecified: Secondary | ICD-10-CM | POA: Diagnosis not present

## 2019-06-12 DIAGNOSIS — N400 Enlarged prostate without lower urinary tract symptoms: Secondary | ICD-10-CM | POA: Diagnosis not present

## 2019-06-17 DIAGNOSIS — I10 Essential (primary) hypertension: Secondary | ICD-10-CM | POA: Diagnosis not present

## 2019-06-17 DIAGNOSIS — D649 Anemia, unspecified: Secondary | ICD-10-CM | POA: Diagnosis not present

## 2019-06-17 DIAGNOSIS — L89152 Pressure ulcer of sacral region, stage 2: Secondary | ICD-10-CM | POA: Diagnosis not present

## 2019-06-17 DIAGNOSIS — I4892 Unspecified atrial flutter: Secondary | ICD-10-CM | POA: Diagnosis not present

## 2019-06-17 DIAGNOSIS — N400 Enlarged prostate without lower urinary tract symptoms: Secondary | ICD-10-CM | POA: Diagnosis not present

## 2019-06-17 DIAGNOSIS — M150 Primary generalized (osteo)arthritis: Secondary | ICD-10-CM | POA: Diagnosis not present

## 2019-06-20 DIAGNOSIS — D649 Anemia, unspecified: Secondary | ICD-10-CM | POA: Diagnosis not present

## 2019-06-20 DIAGNOSIS — M150 Primary generalized (osteo)arthritis: Secondary | ICD-10-CM | POA: Diagnosis not present

## 2019-06-20 DIAGNOSIS — I4892 Unspecified atrial flutter: Secondary | ICD-10-CM | POA: Diagnosis not present

## 2019-06-20 DIAGNOSIS — L89152 Pressure ulcer of sacral region, stage 2: Secondary | ICD-10-CM | POA: Diagnosis not present

## 2019-06-20 DIAGNOSIS — N400 Enlarged prostate without lower urinary tract symptoms: Secondary | ICD-10-CM | POA: Diagnosis not present

## 2019-06-20 DIAGNOSIS — I10 Essential (primary) hypertension: Secondary | ICD-10-CM | POA: Diagnosis not present

## 2019-06-22 DIAGNOSIS — Z87891 Personal history of nicotine dependence: Secondary | ICD-10-CM | POA: Diagnosis not present

## 2019-06-22 DIAGNOSIS — Z86711 Personal history of pulmonary embolism: Secondary | ICD-10-CM | POA: Diagnosis not present

## 2019-06-22 DIAGNOSIS — M150 Primary generalized (osteo)arthritis: Secondary | ICD-10-CM | POA: Diagnosis not present

## 2019-06-22 DIAGNOSIS — L89152 Pressure ulcer of sacral region, stage 2: Secondary | ICD-10-CM | POA: Diagnosis not present

## 2019-06-22 DIAGNOSIS — M6281 Muscle weakness (generalized): Secondary | ICD-10-CM | POA: Diagnosis not present

## 2019-06-22 DIAGNOSIS — Z96649 Presence of unspecified artificial hip joint: Secondary | ICD-10-CM | POA: Diagnosis not present

## 2019-06-22 DIAGNOSIS — Z993 Dependence on wheelchair: Secondary | ICD-10-CM | POA: Diagnosis not present

## 2019-06-22 DIAGNOSIS — Z86718 Personal history of other venous thrombosis and embolism: Secondary | ICD-10-CM | POA: Diagnosis not present

## 2019-06-22 DIAGNOSIS — Z951 Presence of aortocoronary bypass graft: Secondary | ICD-10-CM | POA: Diagnosis not present

## 2019-06-22 DIAGNOSIS — I4892 Unspecified atrial flutter: Secondary | ICD-10-CM | POA: Diagnosis not present

## 2019-06-22 DIAGNOSIS — D649 Anemia, unspecified: Secondary | ICD-10-CM | POA: Diagnosis not present

## 2019-06-22 DIAGNOSIS — N400 Enlarged prostate without lower urinary tract symptoms: Secondary | ICD-10-CM | POA: Diagnosis not present

## 2019-06-22 DIAGNOSIS — I10 Essential (primary) hypertension: Secondary | ICD-10-CM | POA: Diagnosis not present

## 2019-06-22 DIAGNOSIS — L89311 Pressure ulcer of right buttock, stage 1: Secondary | ICD-10-CM | POA: Diagnosis not present

## 2019-06-24 ENCOUNTER — Ambulatory Visit (INDEPENDENT_AMBULATORY_CARE_PROVIDER_SITE_OTHER): Payer: Medicare Other | Admitting: Cardiology

## 2019-06-24 ENCOUNTER — Other Ambulatory Visit: Payer: Self-pay

## 2019-06-24 DIAGNOSIS — Z9889 Other specified postprocedural states: Secondary | ICD-10-CM | POA: Diagnosis not present

## 2019-06-24 DIAGNOSIS — I6300 Cerebral infarction due to thrombosis of unspecified precerebral artery: Secondary | ICD-10-CM

## 2019-06-24 DIAGNOSIS — I4819 Other persistent atrial fibrillation: Secondary | ICD-10-CM | POA: Diagnosis not present

## 2019-06-24 NOTE — Assessment & Plan Note (Signed)
Embolic CVA in 0000000 in setting of INR 2.8.  He was treated with thrombectomy and placed on low dose Eliquis.

## 2019-06-24 NOTE — Assessment & Plan Note (Addendum)
2002 at [facility] echo 2017, normal LVF and normal functioning MV

## 2019-06-24 NOTE — Progress Notes (Signed)
Cardiology Office Note:    Date:  06/24/2019   ID:  Brian [name], DOB 1932/12/13, MRN [id number]  PCP:  Libby Maw, MD  Cardiologist:  Skeet Latch, MD  Electrophysiologist:  None   Referring MD: Libby Maw   No chief complaint on file.   History of Present Illness:    Brian [name] is a 85 y.o. male with a hx of mitral valve repair in 2002 at [facility].  He has had persistent atrial fibrillation.  He was on Coumadin for years.  He presented in 0000000 with an embolic stroke with a therapeutic INR.  He was treated with thrombectomy.  He was placed on low-dose Eliquis, he apparently has had prior GI bleeding.  He is in the office today for routine follow-up.  His EKG shows he remains in atrial fibrillation with a ventricular response of 78.  He is confined to a wheelchair and he has left hemiplegia.  His son accompanied him.  The patient is anxious but in no distress.  He had several questions.  Overall he appears to be stable from a cardiac standpoint.  His son is his primary caregiver at home.  Past Medical History:  Diagnosis Date  . Anemia   . Atrial flutter ([place])   . BPH (benign prostatic hypertrophy)   . Epistaxis   . Hemorrhage of gastrointestinal tract, unspecified   . History of open heart surgery   . Hypertension   . Osteoarthrosis, unspecified whether generalized or localized, unspecified site   . Personal history of venous thrombosis and embolism   . Rosacea   . Stroke ([place])   . TIA (transient ischemic attack)     Past Surgical History:  Procedure Laterality Date  . APPENDECTOMY    . HERNIA REPAIR    . IR GENERIC HISTORICAL  05/04/2016   IR PERCUTANEOUS ART THROMBECTOMY/INFUSION INTRACRANIAL INC DIAG ANGIO 05/04/2016 [name], MD [facility]  . IR GENERIC HISTORICAL  05/04/2016   IR ANGIO VERTEBRAL SEL SUBCLAVIAN INNOMINATE UNI R MOD SED 05/04/2016 [name], MD [facility]  . IR GENERIC HISTORICAL  06/07/2016   IR  RADIOLOGIST EVAL & MGMT 06/07/2016 [facility]  . JOINT REPLACEMENT    . MITRAL VALVE REPAIR    . mvp repair    . RADIOLOGY WITH ANESTHESIA N/A 05/04/2016   Procedure: RADIOLOGY WITH ANESTHESIA;  Surgeon: [name], MD;  Location: [facility];  Service: Radiology;  Laterality: N/A;  . TOTAL HIP ARTHROPLASTY      Current Medications: Current Meds  Medication Sig  . ALPRAZolam (XANAX) 0.25 MG tablet Take 1 tablet (0.25 mg total) by mouth at bedtime.  Marland Kitchen apixaban (ELIQUIS) 2.5 MG TABS tablet Take 1 tablet (2.5 mg total) by mouth 2 (two) times daily.  . cetirizine (ZYRTEC) 10 MG tablet Take 1 tablet (10 mg total) by mouth at bedtime.  . clindamycin (CLEOCIN) 150 MG capsule Take 3 capsules (450 mg total) by mouth 3 (three) times daily. Take three times daily with breakfast, lunch, and dinner.  . finasteride (PROSCAR) 5 MG tablet Take 1 tablet (5 mg total) by mouth daily.  . hydrocortisone (ANUCORT-HC) 25 MG suppository NEEDS APPT UNWRAP AND INSERT 1 SUPPOSITORY RECTALLY TWICE DAILY  . hydrocortisone (PROCTOZONE-HC) 2.5 % rectal cream USE RECTALLY TWICE DAILY  . hydrocortisone-pramoxine (ANALPRAM-HC) 2.5-1 % rectal cream Place 1 application rectally 2 (two) times daily as needed for hemorrhoids.   . lidocaine (LIDODERM) 5 % APPLY 1 PATCH TOPICALLY ONCE DAILY.MAY WEAR  UPTO 12 HOURS MAXIMUM IN A DAY.  . metoprolol succinate (TOPROL-XL) 25 MG 24 hr tablet Take 1 tablet (25 mg total) by mouth daily.  Marland Kitchen oxyCODONE-acetaminophen (PERCOCET/ROXICET) 5-325 MG tablet Take 1 tablet by mouth 2 (two) times daily.  . pantoprazole (PROTONIX) 40 MG tablet Take 1 tablet (40 mg total) by mouth daily.  . pravastatin (PRAVACHOL) 40 MG tablet Take 1 tablet (40 mg total) by mouth daily.  . sertraline (ZOLOFT) 50 MG tablet Take 1 tablet (50 mg total) by mouth every morning.  Cathie Olden (SILVASORB) GEL Apply daily to wound to keep moist.     Allergies:   Novocain [procaine hcl], Other, and Penicillins   Social  History   Socioeconomic History  . Marital status: Married    Spouse name: Not on file  . Number of children: Not on file  . Years of education: Not on file  . Highest education level: Not on file  Occupational History  . Not on file  Social Needs  . Financial resource strain: Not on file  . Food insecurity    Worry: Not on file    Inability: Not on file  . Transportation needs    Medical: Not on file    Non-medical: Not on file  Tobacco Use  . Smoking status: Former Research scientist (life sciences)  . Smokeless tobacco: Never Used  . Tobacco comment: quit 50 yr ago  Substance and Sexual Activity  . Alcohol use: No  . Drug use: No  . Sexual activity: Not on file  Lifestyle  . Physical activity    Days per week: Not on file    Minutes per session: Not on file  . Stress: Not on file  Relationships  . Social Herbalist on phone: Not on file    Gets together: Not on file    Attends religious service: Not on file    Active member of club or organization: Not on file    Attends meetings of clubs or organizations: Not on file    Relationship status: Not on file  Other Topics Concern  . Not on file  Social History Narrative  . Not on file     Family History: The patient's family history includes Coronary artery disease in his father; Rheumatic fever in his mother.  ROS:   Please see the history of present illness.     All other systems reviewed and are negative.  EKGs/Labs/Other Studies Reviewed:    The following studies were reviewed today: Echo 2017  EKG:  EKG is ordered today.  The ekg ordered today demonstrates AF with VR 78  Recent Labs: 03/18/2019: BUN 16; Creatinine, Ser 0.64; Hemoglobin 15.5; Platelets 231.0; Potassium 4.9; Sodium 137  Recent Lipid Panel    Component Value Date/Time   CHOL 134 04/16/2018 1155   TRIG 139 04/16/2018 1155   HDL 51 04/16/2018 1155   CHOLHDL 2.6 04/16/2018 1155   CHOLHDL 3.4 10/03/2016 1157   VLDL 22 10/03/2016 1157   LDLCALC 55  04/16/2018 1155   LDLDIRECT 57.0 09/24/2018 1506    Physical Exam:    VS:  BP 132/64   Pulse 71   Temp 97.6 F (36.4 C)   Ht 5\' 6"  (1.676 m)   Wt 145 lb (65.8 kg)   SpO2 95%   BMI 23.40 kg/m     Wt Readings from Last 3 Encounters:  06/24/19 145 lb (65.8 kg)  05/20/19 149 lb (67.6 kg)  04/30/19 149 lb (67.6 kg)  GEN:  Overweight Caucasian male, in wheel chair HEENT: Normal NECK: No JVD; No carotid bruits LYMPHATICS: No lymphadenopathy CARDIAC: irregularly irregular, no murmurs, rubs, gallops RESPIRATORY:  Decreased Lt base ABDOMEN: Soft, non-tender, non-distended MUSCULOSKELETAL:  No edema; No deformity  SKIN: Warm and dry NEUROLOGIC:  Alert and oriented x 3-ly hemiplegia PSYCHIATRIC:  Normal affect   ASSESSMENT:    Persistent atrial fibrillation ([place]) Tolerating wel  History of mitral valve repair 2002 at [facility] echo 2017, normal LVF and normal functioning MV  Cerebrovascular accident (CVA) due to thrombosis of precerebral artery ([place]) Embolic CVA in 0000000 in setting of INR 2.8.  He was treated with thrombectomy and placed on low dose Eliquis.   PLAN:    Same Rx- f/u dr Oval Linsey in 6 months.    Medication Adjustments/Labs and Tests Ordered: Current medicines are reviewed at length with the patient today.  Concerns regarding medicines are outlined above.  Orders Placed This Encounter  Procedures  . EKG 12-Lead   No orders of the defined types were placed in this encounter.   Patient Instructions  Medication Instructions:  Your physician recommends that you continue on your current medications as directed. Please refer to the Current Medication list given to you today. *If you need a refill on your cardiac medications before your next appointment, please call your pharmacy*  Lab Work: None  If you have labs (blood work) drawn today and your tests are completely normal, you will receive your results only by: Marland Kitchen MyChart Message (if you have MyChart)  OR . A paper copy in the mail If you have any lab test that is abnormal or we need to change your treatment, we will call you to review the results.  Testing/Procedures: none  Follow-Up: At [facility], you and your health needs are our priority.  As part of our continuing mission to provide you with exceptional heart care, we have created designated Provider Care Teams.  These Care Teams include your primary Cardiologist (physician) and Advanced Practice Providers (APPs -  Physician Assistants and Nurse Practitioners) who all work together to provide you with the care you need, when you need it.  Your next appointment:   6 months  The format for your next appointment:   In Person  Provider:   Skeet Latch, MD  Other Instructions     Signed, Kerin Ransom, PA-C  06/24/2019 3:17 PM    [place]

## 2019-06-24 NOTE — Assessment & Plan Note (Signed)
Tolerating wel

## 2019-06-24 NOTE — Patient Instructions (Signed)
Medication Instructions:  Your physician recommends that you continue on your current medications as directed. Please refer to the Current Medication list given to you today. *If you need a refill on your cardiac medications before your next appointment, please call your pharmacy*  Lab Work: None  If you have labs (blood work) drawn today and your tests are completely normal, you will receive your results only by: Marland Kitchen MyChart Message (if you have MyChart) OR . A paper copy in the mail If you have any lab test that is abnormal or we need to change your treatment, we will call you to review the results.  Testing/Procedures: none  Follow-Up: At [facility], you and your health needs are our priority.  As part of our continuing mission to provide you with exceptional heart care, we have created designated Provider Care Teams.  These Care Teams include your primary Cardiologist (physician) and Advanced Practice Providers (APPs -  Physician Assistants and Nurse Practitioners) who all work together to provide you with the care you need, when you need it.  Your next appointment:   6 months  The format for your next appointment:   In Person  Provider:   Skeet Latch, MD  Other Instructions

## 2019-06-26 DIAGNOSIS — N400 Enlarged prostate without lower urinary tract symptoms: Secondary | ICD-10-CM | POA: Diagnosis not present

## 2019-06-26 DIAGNOSIS — L89152 Pressure ulcer of sacral region, stage 2: Secondary | ICD-10-CM | POA: Diagnosis not present

## 2019-06-26 DIAGNOSIS — M150 Primary generalized (osteo)arthritis: Secondary | ICD-10-CM | POA: Diagnosis not present

## 2019-06-26 DIAGNOSIS — D649 Anemia, unspecified: Secondary | ICD-10-CM | POA: Diagnosis not present

## 2019-06-26 DIAGNOSIS — I10 Essential (primary) hypertension: Secondary | ICD-10-CM | POA: Diagnosis not present

## 2019-06-26 DIAGNOSIS — I4892 Unspecified atrial flutter: Secondary | ICD-10-CM | POA: Diagnosis not present

## 2019-06-27 ENCOUNTER — Other Ambulatory Visit: Payer: Self-pay

## 2019-06-27 ENCOUNTER — Telehealth: Payer: Self-pay

## 2019-06-27 DIAGNOSIS — F419 Anxiety disorder, unspecified: Secondary | ICD-10-CM

## 2019-06-27 MED ORDER — ALPRAZOLAM 0.25 MG PO TABS: 0.2500 mg | ORAL_TABLET | Freq: Every day | ORAL | 0 refills | Status: DC

## 2019-06-27 NOTE — Progress Notes (Signed)
This encounter was created in error - please disregard.

## 2019-06-27 NOTE — Telephone Encounter (Signed)
Copied from [place] [id number]. Topic: General - Inquiry >> Jun 27, 2019 11:22 AM Mathis Bud wrote: Reason for CRM: Pamala Hurry from [facility] called stating Dr.Kremer  Reach out to [facility] regarding Brian and Brian [name]  Pamala Hurry is returning call.  HS:[phone]

## 2019-07-03 DIAGNOSIS — M150 Primary generalized (osteo)arthritis: Secondary | ICD-10-CM | POA: Diagnosis not present

## 2019-07-03 DIAGNOSIS — N400 Enlarged prostate without lower urinary tract symptoms: Secondary | ICD-10-CM | POA: Diagnosis not present

## 2019-07-03 DIAGNOSIS — L89152 Pressure ulcer of sacral region, stage 2: Secondary | ICD-10-CM | POA: Diagnosis not present

## 2019-07-03 DIAGNOSIS — I4892 Unspecified atrial flutter: Secondary | ICD-10-CM | POA: Diagnosis not present

## 2019-07-03 DIAGNOSIS — D649 Anemia, unspecified: Secondary | ICD-10-CM | POA: Diagnosis not present

## 2019-07-03 DIAGNOSIS — I10 Essential (primary) hypertension: Secondary | ICD-10-CM | POA: Diagnosis not present

## 2019-07-08 ENCOUNTER — Ambulatory Visit (INDEPENDENT_AMBULATORY_CARE_PROVIDER_SITE_OTHER): Payer: Medicare Other | Admitting: Family Medicine

## 2019-07-08 ENCOUNTER — Other Ambulatory Visit: Payer: Self-pay

## 2019-07-08 ENCOUNTER — Encounter: Payer: Self-pay | Admitting: Family Medicine

## 2019-07-08 ENCOUNTER — Other Ambulatory Visit: Payer: Self-pay | Admitting: Family Medicine

## 2019-07-08 VITALS — BP 128/80 | HR 70 | Ht 66.0 in | Wt 160.0 lb

## 2019-07-08 DIAGNOSIS — I10 Essential (primary) hypertension: Secondary | ICD-10-CM

## 2019-07-08 DIAGNOSIS — M79672 Pain in left foot: Secondary | ICD-10-CM

## 2019-07-08 DIAGNOSIS — L89321 Pressure ulcer of left buttock, stage 1: Secondary | ICD-10-CM

## 2019-07-08 DIAGNOSIS — M169 Osteoarthritis of hip, unspecified: Secondary | ICD-10-CM

## 2019-07-08 DIAGNOSIS — I480 Paroxysmal atrial fibrillation: Secondary | ICD-10-CM

## 2019-07-08 DIAGNOSIS — M25559 Pain in unspecified hip: Secondary | ICD-10-CM

## 2019-07-08 DIAGNOSIS — M19012 Primary osteoarthritis, left shoulder: Secondary | ICD-10-CM

## 2019-07-08 MED ORDER — METOPROLOL SUCCINATE ER 25 MG PO TB24: 25.0000 mg | ORAL_TABLET | Freq: Every day | ORAL | 3 refills | Status: DC

## 2019-07-08 NOTE — Telephone Encounter (Signed)
Medication Refill - Medication: oxyCODONE-acetaminophen (PERCOCET/ROXICET) 5-325 MG tablet    Has the patient contacted their pharmacy? Yes.   (Agent: If no, request that the patient contact the pharmacy for the refill.) (Agent: If yes, when and what did the pharmacy advise?)  Preferred Pharmacy (with phone number or street name): [facility] [place], [place]: Please be advised that RX refills may take up to 3 business days. We ask that you follow-up with your pharmacy.

## 2019-07-08 NOTE — Progress Notes (Signed)
Established Patient Office Visit  Subjective:  Patient ID: Brian [name], male    DOB: 05-Mar-1933  Age: 85 y.o. MRN: [id number]  CC:  Chief Complaint  Patient presents with   Follow-up    HPI Brian [name] presents for follow-up of his hypertension and pressure sore of left buttock.  Blood pressure has been well controlled with the metoprolol.  Patient son and extended caregivers have been applying the silver gel daily.  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  Past Medical History:  Diagnosis Date   Anemia    Atrial flutter (HCC)    BPH (benign prostatic hypertrophy)    Epistaxis    Hemorrhage of gastrointestinal tract, unspecified    History of open heart surgery    Hypertension    Osteoarthrosis, unspecified whether generalized or localized, unspecified site    Personal history of venous thrombosis and embolism    Rosacea    Stroke ([place])    TIA (transient ischemic attack)     Past Surgical History:  Procedure Laterality Date   APPENDECTOMY     HERNIA REPAIR     IR GENERIC HISTORICAL  05/04/2016   IR PERCUTANEOUS ART THROMBECTOMY/INFUSION INTRACRANIAL INC DIAG ANGIO 05/04/2016 [name], MD [facility]   IR GENERIC HISTORICAL  05/04/2016   IR ANGIO VERTEBRAL SEL SUBCLAVIAN INNOMINATE UNI R MOD SED 05/04/2016 [name], MD [facility]   IR GENERIC HISTORICAL  06/07/2016   IR RADIOLOGIST EVAL & MGMT 06/07/2016 [facility]   JOINT REPLACEMENT     MITRAL VALVE REPAIR     mvp repair     RADIOLOGY WITH ANESTHESIA N/A 05/04/2016   Procedure: RADIOLOGY WITH ANESTHESIA;  Surgeon: [name], MD;  Location: [facility];  Service: Radiology;  Laterality: N/A;   TOTAL HIP ARTHROPLASTY      Family History  Problem Relation Age of Onset   Rheumatic fever Mother    Coronary artery disease Father     Social History   Socioeconomic History   Marital status: Married    Spouse name: Not  on file   Number of children: Not on file   Years of education: Not on file   Highest education level: Not on file  Occupational History   Not on file  Social Needs   Financial resource strain: Not on file   Food insecurity    Worry: Not on file    Inability: Not on file   Transportation needs    Medical: Not on file    Non-medical: Not on file  Tobacco Use   Smoking status: Former Smoker   Smokeless tobacco: Never Used   Tobacco comment: quit 50 yr ago  Substance and Sexual Activity   Alcohol use: No   Drug use: No   Sexual activity: Not on file  Lifestyle   Physical activity    Days per week: Not on file    Minutes per session: Not on file   Stress: Not on file  Relationships   Social connections    Talks on phone: Not on file    Gets together: Not on file    Attends religious service: Not on file    Active member of club or organization: Not on file    Attends meetings of clubs or organizations: Not on file    Relationship status: Not on file   Intimate partner violence    Fear of current or ex  partner: Not on file    Emotionally abused: Not on file    Physically abused: Not on file    Forced sexual activity: Not on file  Other Topics Concern   Not on file  Social History Narrative   Not on file    Outpatient Medications Prior to Visit  Medication Sig Dispense Refill   ALPRAZolam (XANAX) 0.25 MG tablet Take 1 tablet (0.25 mg total) by mouth at bedtime. 90 tablet 0   apixaban (ELIQUIS) 2.5 MG TABS tablet Take 1 tablet (2.5 mg total) by mouth 2 (two) times daily. 180 tablet 1   cetirizine (ZYRTEC) 10 MG tablet Take 1 tablet (10 mg total) by mouth at bedtime. 90 tablet 2   clindamycin (CLEOCIN) 150 MG capsule Take 3 capsules (450 mg total) by mouth 3 (three) times daily. Take three times daily with breakfast, lunch, and dinner. 56 capsule 0   finasteride (PROSCAR) 5 MG tablet Take 1 tablet (5 mg total) by mouth daily. 90 tablet 3    hydrocortisone (ANUCORT-HC) 25 MG suppository NEEDS APPT UNWRAP AND INSERT 1 SUPPOSITORY RECTALLY TWICE DAILY 30 suppository 0   hydrocortisone (PROCTOZONE-HC) 2.5 % rectal cream USE RECTALLY TWICE DAILY 30 g 0   hydrocortisone-pramoxine (ANALPRAM-HC) 2.5-1 % rectal cream Place 1 application rectally 2 (two) times daily as needed for hemorrhoids.      lidocaine (LIDODERM) 5 % APPLY 1 PATCH TOPICALLY ONCE DAILY.MAY WEAR UPTO 12 HOURS MAXIMUM IN A DAY.     oxyCODONE-acetaminophen (PERCOCET/ROXICET) 5-325 MG tablet Take 1 tablet by mouth 2 (two) times daily. 180 tablet 0   pantoprazole (PROTONIX) 40 MG tablet Take 1 tablet (40 mg total) by mouth daily. 90 tablet 3   pravastatin (PRAVACHOL) 40 MG tablet Take 1 tablet (40 mg total) by mouth daily. 90 tablet 3   sertraline (ZOLOFT) 50 MG tablet Take 1 tablet (50 mg total) by mouth every morning. 90 tablet 3   Silver (SILVASORB) GEL Apply daily to wound to keep moist. 44.4 mL 0   metoprolol succinate (TOPROL-XL) 25 MG 24 hr tablet Take 1 tablet (25 mg total) by mouth daily. 90 tablet 3   No facility-administered medications prior to visit.     Allergies  Allergen Reactions   Novocain [Procaine Hcl]     Irregular heart beat      Other     Patient had problem with epidural with his right hip surgery. Went to up extremity instead of lower extremity    Penicillins Itching    Has patient had a PCN reaction causing immediate rash, facial/tongue/throat swelling, SOB or lightheadedness with hypotension: Unk Has patient had a PCN reaction causing severe rash involving mucus membranes or skin necrosis: Unk Has patient had a PCN reaction that required hospitalization: Unk Has patient had a PCN reaction occurring within the last 10 years: No If all of the above answers are "NO", then may proceed with Cephalosporin use.  No reaction noted    ROS Review of Systems  Constitutional: Negative.   Respiratory: Negative.   Cardiovascular:  Negative.   Gastrointestinal: Negative.   Genitourinary: Negative for difficulty urinating.  Musculoskeletal: Positive for arthralgias, back pain and gait problem.  Skin: Positive for color change and wound.  Allergic/Immunologic: Negative for food allergies and immunocompromised state.  Neurological: Positive for weakness.  Hematological: Does not bruise/bleed easily.  Psychiatric/Behavioral: The patient is nervous/anxious.       Objective:    Physical Exam  Constitutional: He is oriented to person, place,  and time. He appears well-developed and well-nourished. No distress.  HENT:  Head: Normocephalic and atraumatic.  Right Ear: External ear normal.  Left Ear: External ear normal.  Mouth/Throat: Oropharynx is clear and moist. No oropharyngeal exudate.  Eyes: Conjunctivae are normal. Right eye exhibits no discharge. Left eye exhibits no discharge. No scleral icterus.  Neck: No JVD present. No tracheal deviation present.  Cardiovascular: Normal rate, regular rhythm and normal heart sounds.  Pulmonary/Chest: Effort normal and breath sounds normal. No stridor.  Musculoskeletal:        General: No edema.  Neurological: He is alert and oriented to person, place, and time.  Skin: Skin is warm and dry. He is not diaphoretic.     Psychiatric: He has a normal mood and affect. His behavior is normal.    BP 128/80    Pulse 70    Ht 5\' 6"  (1.676 m)    SpO2 98%    BMI 23.40 kg/m  Wt Readings from Last 3 Encounters:  06/24/19 145 lb (65.8 kg)  05/20/19 149 lb (67.6 kg)  04/30/19 149 lb (67.6 kg)   BP Readings from Last 3 Encounters:  07/08/19 128/80  06/24/19 132/64  05/20/19 125/78   Guideline developer:  UpToDate (see UpToDate for funding source) Date Released: June 2014  There are no preventive care reminders to display for this patient.  There are no preventive care reminders to display for this patient.  Lab Results  Component Value Date   TSH 2.99 09/26/2016   Lab  Results  Component Value Date   WBC 7.0 03/18/2019   HGB 15.5 03/18/2019   HCT 46.0 03/18/2019   MCV 96.6 03/18/2019   PLT 231.0 03/18/2019   Lab Results  Component Value Date   NA 137 03/18/2019   K 4.9 03/18/2019   CO2 31 03/18/2019   GLUCOSE 159 (H) 03/18/2019   BUN 16 03/18/2019   CREATININE 0.64 03/18/2019   BILITOT 0.8 05/12/2018   ALKPHOS 73 05/12/2018   AST 25 05/12/2018   ALT 24 05/12/2018   PROT 6.4 (L) 05/12/2018   ALBUMIN 3.5 05/12/2018   CALCIUM 9.5 03/18/2019   ANIONGAP 13 11/03/2018   GFR 119.09 03/18/2019   Lab Results  Component Value Date   CHOL 134 04/16/2018   Lab Results  Component Value Date   HDL 51 04/16/2018   Lab Results  Component Value Date   LDLCALC 55 04/16/2018   Lab Results  Component Value Date   TRIG 139 04/16/2018   Lab Results  Component Value Date   CHOLHDL 2.6 04/16/2018   Lab Results  Component Value Date   HGBA1C 4.9 04/08/2019      Assessment & Plan:   Problem List Items Addressed This Visit      Cardiovascular and Mediastinum   Essential hypertension - Primary   Relevant Medications   metoprolol succinate (TOPROL-XL) 25 MG 24 hr tablet     Musculoskeletal and Integument   Pressure injury of buttock, stage 1   Relevant Orders   AMB referral to wound care center    Other Visit Diagnoses    Paroxysmal atrial fibrillation (HCC)       Relevant Medications   metoprolol succinate (TOPROL-XL) 25 MG 24 hr tablet      Meds ordered this encounter  Medications   metoprolol succinate (TOPROL-XL) 25 MG 24 hr tablet    Sig: Take 1 tablet (25 mg total) by mouth daily.    Dispense:  90 tablet  Refill:  3    Follow-up: Return in about 2 months (around 09/07/2019).   Believe the patient son and extended caregivers are doing the best they can but patient may need more comprehensive care and what is available to him at home.  Refer to wound care center.

## 2019-07-09 MED ORDER — OXYCODONE-ACETAMINOPHEN 5-325 MG PO TABS: 1.0000 | ORAL_TABLET | Freq: Two times a day (BID) | ORAL | 0 refills | Status: DC

## 2019-07-11 ENCOUNTER — Other Ambulatory Visit: Payer: Self-pay | Admitting: Gastroenterology

## 2019-07-11 DIAGNOSIS — L89152 Pressure ulcer of sacral region, stage 2: Secondary | ICD-10-CM | POA: Diagnosis not present

## 2019-07-11 DIAGNOSIS — I4892 Unspecified atrial flutter: Secondary | ICD-10-CM | POA: Diagnosis not present

## 2019-07-11 DIAGNOSIS — N400 Enlarged prostate without lower urinary tract symptoms: Secondary | ICD-10-CM | POA: Diagnosis not present

## 2019-07-11 DIAGNOSIS — I10 Essential (primary) hypertension: Secondary | ICD-10-CM | POA: Diagnosis not present

## 2019-07-11 DIAGNOSIS — M150 Primary generalized (osteo)arthritis: Secondary | ICD-10-CM | POA: Diagnosis not present

## 2019-07-11 DIAGNOSIS — D649 Anemia, unspecified: Secondary | ICD-10-CM | POA: Diagnosis not present

## 2019-07-18 DIAGNOSIS — N400 Enlarged prostate without lower urinary tract symptoms: Secondary | ICD-10-CM | POA: Diagnosis not present

## 2019-07-18 DIAGNOSIS — I10 Essential (primary) hypertension: Secondary | ICD-10-CM | POA: Diagnosis not present

## 2019-07-18 DIAGNOSIS — L89152 Pressure ulcer of sacral region, stage 2: Secondary | ICD-10-CM | POA: Diagnosis not present

## 2019-07-18 DIAGNOSIS — M150 Primary generalized (osteo)arthritis: Secondary | ICD-10-CM | POA: Diagnosis not present

## 2019-07-18 DIAGNOSIS — D649 Anemia, unspecified: Secondary | ICD-10-CM | POA: Diagnosis not present

## 2019-07-18 DIAGNOSIS — I4892 Unspecified atrial flutter: Secondary | ICD-10-CM | POA: Diagnosis not present

## 2019-07-22 DIAGNOSIS — L89322 Pressure ulcer of left buttock, stage 2: Secondary | ICD-10-CM | POA: Diagnosis not present

## 2019-07-22 DIAGNOSIS — I69354 Hemiplegia and hemiparesis following cerebral infarction affecting left non-dominant side: Secondary | ICD-10-CM | POA: Diagnosis not present

## 2019-07-22 DIAGNOSIS — Z87891 Personal history of nicotine dependence: Secondary | ICD-10-CM | POA: Diagnosis not present

## 2019-07-22 DIAGNOSIS — D649 Anemia, unspecified: Secondary | ICD-10-CM | POA: Diagnosis not present

## 2019-07-22 DIAGNOSIS — Z993 Dependence on wheelchair: Secondary | ICD-10-CM | POA: Diagnosis not present

## 2019-07-22 DIAGNOSIS — M150 Primary generalized (osteo)arthritis: Secondary | ICD-10-CM | POA: Diagnosis not present

## 2019-07-22 DIAGNOSIS — R41841 Cognitive communication deficit: Secondary | ICD-10-CM | POA: Diagnosis not present

## 2019-07-22 DIAGNOSIS — I10 Essential (primary) hypertension: Secondary | ICD-10-CM | POA: Diagnosis not present

## 2019-07-22 DIAGNOSIS — Z951 Presence of aortocoronary bypass graft: Secondary | ICD-10-CM | POA: Diagnosis not present

## 2019-07-22 DIAGNOSIS — I4892 Unspecified atrial flutter: Secondary | ICD-10-CM | POA: Diagnosis not present

## 2019-07-22 DIAGNOSIS — N400 Enlarged prostate without lower urinary tract symptoms: Secondary | ICD-10-CM | POA: Diagnosis not present

## 2019-07-22 DIAGNOSIS — Z86711 Personal history of pulmonary embolism: Secondary | ICD-10-CM | POA: Diagnosis not present

## 2019-07-22 DIAGNOSIS — Z86718 Personal history of other venous thrombosis and embolism: Secondary | ICD-10-CM | POA: Diagnosis not present

## 2019-07-22 DIAGNOSIS — Z96649 Presence of unspecified artificial hip joint: Secondary | ICD-10-CM | POA: Diagnosis not present

## 2019-07-28 ENCOUNTER — Ambulatory Visit: Payer: Self-pay

## 2019-07-28 ENCOUNTER — Telehealth: Payer: Self-pay | Admitting: Family Medicine

## 2019-07-28 DIAGNOSIS — R635 Abnormal weight gain: Secondary | ICD-10-CM

## 2019-07-28 NOTE — Telephone Encounter (Signed)
Pt stated he thinks he has gained some weight and he also stated he is supposed to be receiving rx for Enbrel for arthritis. Requesting CB from Dr. Bebe Shaggy assistant. Please advise.

## 2019-07-28 NOTE — Telephone Encounter (Signed)
Dr. Ethelene Hal, I already sent you pt's other message about his weight concerns, but can you address his request for Enbrel

## 2019-07-28 NOTE — Telephone Encounter (Signed)
Dr. Ethelene Hal, I spoke with pt and he would like that referral

## 2019-07-28 NOTE — Addendum Note (Signed)
Addended by: Jon Billings on: 07/28/2019 02:09 PM   Modules accepted: Orders

## 2019-07-28 NOTE — Telephone Encounter (Signed)
Our scales have shown patient's weight to be stable. How would he feel about a nutritionist consultation?

## 2019-07-28 NOTE — Telephone Encounter (Signed)
Called patient's wife about an issue reported and her husband got on the line. He states he has tried to contact Dr Ethelene Hal about weight gain that he is concerned about. Patient states that he has had a stroke and only weighs at the office.  He states his last weight was 160 and usually weighs 140. He denies swelling to his feet, legs, hands,  He states his belly is big and his clothing is tight. Today his rectal temp is 99. He states he is trying to eat healthy. He does have Hx of Afib. Per protocol call was t routed to office.  Will route note for further evaluation. Patient is aware.  Reason for Disposition . Nursing judgment or information in reference  Answer Assessment - Initial Assessment Questions 1. REASON FOR CALL: "What is your main concern right now?"     Weight gain 2. ONSET: "When did the weight gain start?"    2 weeks ago 3. SEVERITY: "How bad is the weight gain?"     160 was weighing 140 4. FEVER: "Do you have a fever?"     99 rectal 5. OTHER SYMPTOMS: "Do you have any other new symptoms?"     No swelling 6. INTERVENTIONS AND RESPONSE: "What have you done so far to try to make this better? What medications have you used?"     Eating healthy 7. PREGNANCY: "Is there any chance you are pregnant?"     N/A  Protocols used: NO GUIDELINE AVAILABLE-A-AH

## 2019-08-01 ENCOUNTER — Ambulatory Visit: Payer: Self-pay | Admitting: *Deleted

## 2019-08-01 DIAGNOSIS — L89322 Pressure ulcer of left buttock, stage 2: Secondary | ICD-10-CM | POA: Diagnosis not present

## 2019-08-01 DIAGNOSIS — I4892 Unspecified atrial flutter: Secondary | ICD-10-CM | POA: Diagnosis not present

## 2019-08-01 DIAGNOSIS — I69354 Hemiplegia and hemiparesis following cerebral infarction affecting left non-dominant side: Secondary | ICD-10-CM | POA: Diagnosis not present

## 2019-08-01 DIAGNOSIS — I10 Essential (primary) hypertension: Secondary | ICD-10-CM | POA: Diagnosis not present

## 2019-08-01 DIAGNOSIS — M150 Primary generalized (osteo)arthritis: Secondary | ICD-10-CM | POA: Diagnosis not present

## 2019-08-01 DIAGNOSIS — D649 Anemia, unspecified: Secondary | ICD-10-CM | POA: Diagnosis not present

## 2019-08-01 NOTE — Telephone Encounter (Addendum)
Danae Chen, RN  from [facility] called after assessing this patient. She stated that she could hear wheezes in the upper right lung of this patient. The patient denies shortness of breath but wanted to know what could he take for chest congestion. He has phlegm that he can't cough up but gets stuck in his throat.  The nurse is asking for a call back regarding what medications she can offer him.  Her # is [phone] Notified [facility] at [facility] for a call back. Routing to the practice    Home health nurse, Danae Chen notified that the practice will call her back regarding the request mentioned above. Message left on her answer machine. Reason for Disposition . [1] Caller requesting a NON-URGENT new prescription or refill AND [2] triager unable to refill per unit policy  Answer Assessment - Initial Assessment Questions 1.   NAME of MEDICATION: "What medicine are you calling about?"     See note 2.   QUESTION: "What is your question?"     What can the patient take for congestion and wheezing 3.   PRESCRIBING HCP: "Who prescribed it?" Reason: if prescribed by specialist, call should be referred to that group.      4. SYMPTOMS: "Do you have any symptoms?"     Unable to cough up the phlegm  5. SEVERITY: If symptoms are present, ask "Are they mild, moderate or severe?"     Mild  6.  PREGNANCY:  "Is there any chance that you are pregnant?" "When was your last menstrual period?"  Protocols used: MEDICATION QUESTION CALL-A-AH

## 2019-08-04 NOTE — Telephone Encounter (Signed)
Brian [name] and informed her of Dr. Bebe Shaggy message. Shes states she will reach out to pt's caregiver and son and informed them of the message. She had no additional questions at this time. Nothing further is needed

## 2019-08-04 NOTE — Telephone Encounter (Signed)
May try mucinex. Needs of virtual visit if he does not promptly improve.

## 2019-08-05 ENCOUNTER — Other Ambulatory Visit: Payer: Self-pay

## 2019-08-05 ENCOUNTER — Encounter (HOSPITAL_BASED_OUTPATIENT_CLINIC_OR_DEPARTMENT_OTHER): Payer: Medicare Other | Attending: Internal Medicine | Admitting: Internal Medicine

## 2019-08-05 DIAGNOSIS — Z872 Personal history of diseases of the skin and subcutaneous tissue: Secondary | ICD-10-CM | POA: Insufficient documentation

## 2019-08-05 DIAGNOSIS — Z86718 Personal history of other venous thrombosis and embolism: Secondary | ICD-10-CM | POA: Diagnosis not present

## 2019-08-05 DIAGNOSIS — I2510 Atherosclerotic heart disease of native coronary artery without angina pectoris: Secondary | ICD-10-CM | POA: Insufficient documentation

## 2019-08-05 DIAGNOSIS — Z09 Encounter for follow-up examination after completed treatment for conditions other than malignant neoplasm: Secondary | ICD-10-CM | POA: Diagnosis present

## 2019-08-05 DIAGNOSIS — I69351 Hemiplegia and hemiparesis following cerebral infarction affecting right dominant side: Secondary | ICD-10-CM | POA: Diagnosis not present

## 2019-08-05 DIAGNOSIS — I4891 Unspecified atrial fibrillation: Secondary | ICD-10-CM | POA: Insufficient documentation

## 2019-08-05 DIAGNOSIS — L89321 Pressure ulcer of left buttock, stage 1: Secondary | ICD-10-CM | POA: Diagnosis not present

## 2019-08-05 DIAGNOSIS — I10 Essential (primary) hypertension: Secondary | ICD-10-CM | POA: Insufficient documentation

## 2019-08-07 ENCOUNTER — Telehealth: Payer: Self-pay | Admitting: Family Medicine

## 2019-08-07 NOTE — Progress Notes (Signed)
CARLSON, PHOEBUS ([id number]) Visit Report for 08/05/2019 Allergy List Details Patient Name: Date of Service: MATHIS, NEAULT 08/05/2019 1:15 PM Medical Record [id number] Patient Account Number: [id number] Date of Birth/Sex: Treating RN: 25-Sep-1932 (85 y.o. [name] Primary Care Martell Mcfadyen: Abelino Derrick Other Clinician: Referring Ollie Delano: Treating Herbert Aguinaldo/Extender:Robson, Orlena Sheldon, [place] Dom in Treatment: 0 Allergies Active Allergies penicillin Reaction: itching Severity: Moderate Novocain Reaction: irregular heart beat Allergy Notes Electronic Signature(s) Signed: 08/07/2019 5:32:06 PM By: Levan Hurst RN, BSN Entered By: Levan Hurst on 08/05/2019 14:54:32 -------------------------------------------------------------------------------- Arrival Information Details Patient Name: Date of Service: [name]. 08/05/2019 1:15 PM Medical Record [id number] Patient Account Number: [id number] Date of Birth/Sex: Treating RN: 12-13-1932 (85 y.o. [name] Primary Care Kemoni Quesenberry: Abelino Derrick Other Clinician: Referring Chester Sibert: Treating Laurna Shetley/Extender:Robson, Orlena Sheldon, [place] Dom in Treatment: 0 Visit Information Patient Arrived: Wheel Chair Arrival Time: 14:50 Accompanied By: son Transfer Assistance: None Patient Identification Verified: Yes Secondary Verification Process Yes Completed: Patient Requires Transmission- No Based Precautions: Patient Has Alerts: Yes Patient Alerts: Patient on Blood Thinner Electronic Signature(s) Signed: 08/07/2019 5:32:06 PM By: Levan Hurst RN, BSN Entered By: Levan Hurst on 08/05/2019 14:51:20 -------------------------------------------------------------------------------- Clinic Level of Care Assessment Details Patient Name: Date of Service: FRENCH, LAMBERT. 08/05/2019 1:15 PM Medical Record [id number] Patient Account Number: [id number] Date of  Birth/Sex: Treating RN: 1933-02-05 (86 y.o. Jerilynn Mages) Carlene Coria Primary Care Fabyan Loughmiller: Abelino Derrick Other Clinician: Referring Omario Ander: Treating Verlia Kaney/Extender:Robson, Orlena Sheldon, [place] Dom in Treatment: 0 Clinic Level of Care Assessment Items TOOL 2 Quantity Score X - Use when only an EandM is performed on the INITIAL visit 1 0 ASSESSMENTS - Nursing Assessment / Reassessment X - General Physical Exam (combine w/ comprehensive assessment (listed just below) 1 20 when performed on new pt. evals) X - Comprehensive Assessment (HX, ROS, Risk Assessments, Wounds Hx, etc.) 1 25 ASSESSMENTS - Wound and Skin Assessment / Reassessment []  - Simple Wound Assessment / Reassessment - one wound 0 X - Complex Wound Assessment / Reassessment - multiple wounds 1 5 []  - Dermatologic / Skin Assessment (not related to wound area) 0 ASSESSMENTS - Ostomy and/or Continence Assessment and Care []  - Incontinence Assessment and Management 0 []  - Ostomy Care Assessment and Management (repouching, etc.) 0 PROCESS - Coordination of Care X - Simple Patient / Family Education for ongoing care 1 15 []  - Complex (extensive) Patient / Family Education for ongoing care 0 X - Staff obtains Programmer, systems, Records, Test Results / Process Orders 1 10 []  - Staff telephones HHA, Nursing Homes / Clarify orders / etc 0 []  - Routine Transfer to another Facility (non-emergent condition) 0 []  - Routine Hospital Admission (non-emergent condition) 0 []  - New Admissions / Biomedical engineer / Ordering NPWT, Apligraf, etc. 0 []  - Emergency Hospital Admission (emergent condition) 0 X - Simple Discharge Coordination 1 10 []  - Complex (extensive) Discharge Coordination 0 PROCESS - Special Needs []  - Pediatric / Minor Patient Management 0 []  - Isolation Patient Management 0 []  - Hearing / Language / Visual special needs 0 []  - Assessment of Community assistance (transportation, D/C planning, etc.) 0 []  - Additional  assistance / Altered mentation 0 []  - Support Surface(s) Assessment (bed, cushion, seat, etc.) 0 INTERVENTIONS - Wound Cleansing / Measurement X - Wound Imaging (photographs - any number of wounds) 1 5 []  - Wound Tracing (instead of photographs) 0 []  - Simple Wound Measurement - one wound 0 []  - Complex Wound Measurement - multiple wounds 0 []  - Simple  Wound Cleansing - one wound 0 []  - Complex Wound Cleansing - multiple wounds 0 INTERVENTIONS - Wound Dressings []  - Small Wound Dressing one or multiple wounds 0 []  - Medium Wound Dressing one or multiple wounds 0 []  - Large Wound Dressing one or multiple wounds 0 []  - Application of Medications - injection 0 INTERVENTIONS - Miscellaneous []  - External ear exam 0 []  - Specimen Collection (cultures, biopsies, blood, body fluids, etc.) 0 []  - Specimen(s) / Culture(s) sent or taken to Lab for analysis 0 []  - Patient Transfer (multiple staff / [name] Lift / Similar devices) 0 []  - Simple Staple / Suture removal (25 or less) 0 []  - Complex Staple / Suture removal (26 or more) 0 []  - Hypo / Hyperglycemic Management (close monitor of Blood Glucose) 0 X - Ankle / Brachial Index (ABI) - do not check if billed separately 1 15 Has the patient been seen at the hospital within the last three years: Yes Total Score: 105 Level Of Care: New/Established - Level 3 Electronic Signature(s) Signed: 08/06/2019 9:49:12 AM By: Carlene Coria RN Entered By: Carlene Coria on 08/05/2019 15:33:35 -------------------------------------------------------------------------------- [place] Details Patient Name: Date of Service: [name]. 08/05/2019 1:15 PM Medical Record [id number] Patient Account Number: [id number] Date of Birth/Sex: Treating RN: February 18, 1933 (86 y.o. Oval Linsey Primary Care Adanna Zuckerman: Abelino Derrick Other Clinician: Referring Other Atienza: Treating Clodagh Odenthal/Extender:Robson, Orlena Sheldon, [place] Dom in  Treatment: 0 Active Inactive Electronic Signature(s) Signed: 08/06/2019 9:49:12 AM By: Carlene Coria RN Entered By: Carlene Coria on 08/05/2019 17:48:29 -------------------------------------------------------------------------------- Pain Assessment Details Patient Name: Date of Service: MAXIMOUS, SUIRE. 08/05/2019 1:15 PM Medical Record [id number] Patient Account Number: [id number] Date of Birth/Sex: Treating RN: [birth year] (85 y.o. [name] Primary Care Walburga Hudman: Abelino Derrick Other Clinician: Referring Grahm Etsitty: Treating Kinnedy Mongiello/Extender:Robson, Orlena Sheldon, [place] Dom in Treatment: 0 Active Problems Location of Pain Severity and Description of Pain Patient Has Paino No Site Locations Pain Management and Medication Current Pain Management: Electronic Signature(s) Signed: 08/07/2019 5:32:06 PM By: Levan Hurst RN, BSN Entered By: Levan Hurst on 08/05/2019 15:15:58 -------------------------------------------------------------------------------- Patient/Caregiver Education Details Patient Name: [name] 12/15/2020andnbsp1:15 Date of Service: PM Medical Record [id number] Number: Patient Account Number: [id number] Treating RN: Date of Birth/Gender: 03/22/1933 (86 y.o. Oval Linsey) Other Clinician: Primary Care Physician: Ellie Lunch Referring Physician: Physician/Extender: Lendon Ka in Treatment: 0 Education Assessment Education Provided To: Patient Education Topics Provided Wound/Skin Impairment: Methods: Explain/Verbal Responses: State content correctly Electronic Signature(s) Signed: 08/06/2019 9:49:12 AM By: Carlene Coria RN Entered By: Carlene Coria on 08/05/2019 17:48:34 -------------------------------------------------------------------------------- [place] Details Patient Name: Date of Service: [name]. 08/05/2019 1:15 PM Medical Record [id number] Patient Account  Number: [id number] Date of Birth/Sex: Treating RN: Dec 04, 1932 (85 y.o. [name] Primary Care Tieisha Darden: Abelino Derrick Other Clinician: Referring Jacqulyne Gladue: Treating Peyson Postema/Extender:Robson, Orlena Sheldon, [place] Dom in Treatment: 0 Vital Signs Time Taken: 14:50 Temperature (F): 98.2 Height (in): 66 Pulse (bpm): 62 Source: Stated Respiratory Rate (breaths/min): 18 Weight (lbs): 160 Blood Pressure (mmHg): 155/71 Source: Stated Reference Range: 80 - 120 mg / dl Body Mass Index (BMI): 25.8 Electronic Signature(s) Signed: 08/07/2019 5:32:06 PM By: Levan Hurst RN, BSN Entered By: Levan Hurst on 08/05/2019 14:53:10

## 2019-08-07 NOTE — Telephone Encounter (Signed)
Spoke with pt about Dr. Kremer's message. Pt understood and had no additional questions at this time.  

## 2019-08-07 NOTE — Telephone Encounter (Signed)
I don't believe that he would be a good candidate for this medication. His rheumatologist would be the one to make that call. Don't see any drug interactions.

## 2019-08-07 NOTE — Progress Notes (Signed)
RANKIN, NOVEMBER ([id number]) Visit Report for 08/05/2019 Chief Complaint Document Details Patient Name: Date of Service: LACEDRIC, GAUGH 08/05/2019 1:15 PM Medical Record [id number] Patient Account Number: [id number] Date of Birth/Sex: Treating RN: 18-Apr-1933 (85 y.o. M) Primary Care Provider: Abelino Derrick Other Clinician: Referring Provider: Treating Provider/Extender:Henretter Piekarski, Orlena Sheldon, [place] Dom in Treatment: 0 Information Obtained from: Patient Chief Complaint 08/05/2019; patient is here for review of pressure ulcers on his buttock Electronic Signature(s) Signed: 08/05/2019 5:53:46 PM By: Linton Ham MD Entered By: Linton Ham on 08/05/2019 15:49:52 -------------------------------------------------------------------------------- HPI Details Patient Name: Date of Service: [name]. 08/05/2019 1:15 PM Medical Record [id number] Patient Account Number: [id number] Date of Birth/Sex: Treating RN: December 06, 1932 (85 y.o. M) Primary Care Provider: Abelino Derrick Other Clinician: Referring Provider: Treating Provider/Extender:Isaih Bulger, Orlena Sheldon, [place] Dom in Treatment: 0 History of Present Illness HPI Description: ADMISSION 08/05/19 This is an 85 year old man who arrives in clinic accompanied by his son. 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 Apparently noted recently to have a stage I pressure area on the left buttock. He does not have a wound history. He does have been air fluidized mattress. They have a wheelchair cushion as well as a pillow over the top of this. He is not incontinent of urine or bowel. Past medical history includes hypertension, osteoarthritis of the knee, atrial fibrillation, history of mitral valve repair, right hemisphere CVA with left hemiparesis, history of a DVT Electronic Signature(s) Signed: 08/05/2019 5:53:46 PM By: Linton Ham MD Entered By: Linton Ham on  08/05/2019 15:51:46 -------------------------------------------------------------------------------- Physical Exam Details Patient Name: Date of Service: [name]. 08/05/2019 1:15 PM Medical Record [id number] Patient Account Number: [id number] Date of Birth/Sex: Treating RN: [birth year] (85 y.o. M) Primary Care Provider: Abelino Derrick Other Clinician: Referring Provider: Treating Provider/Extender:Alfred Eckley, Orlena Sheldon, [place] Dom in Treatment: 0 Constitutional Patient is hypertensive.. Pulse regular and within target range for patient.Marland Kitchen Respirations regular, non-labored and within target range.. Temperature is normal and within the target range for the patient.Marland Kitchen Appears in no distress. Eyes Conjunctivae clear. No discharge.no icterus. Respiratory work of breathing is normal. Bilateral breath sounds are clear and equal in all lobes with no wheezes, rales or rhonchi.. Cardiovascular Heart rhythm and rate regular, without murmur or gallop.. Gastrointestinal (GI) Abdomen is soft and non-distended without masses or tenderness.. Genitourinary (GU) Not distended. Integumentary (Hair, Skin) No primary skin disorder is seen. Neurological Compatible with a right middle cerebral artery CVA. Psychiatric appears at normal baseline. Notes Wound exam; his buttocks, sacrum, coccyx and areas of the gluteal cleft were all carefully examined. He has no open wound. The areas over the ischial tuberosities look normal. Electronic Signature(s) Signed: 08/05/2019 5:53:46 PM By: Linton Ham MD Entered By: Linton Ham on 08/05/2019 15:55:39 -------------------------------------------------------------------------------- Physician Orders Details Patient Name: Date of Service: [name]. 08/05/2019 1:15 PM Medical Record [id number] Patient Account Number: [id number] Date of Birth/Sex: Treating RN: [birth year] (86 y.o. Oval Linsey Primary Care Provider:  Abelino Derrick Other Clinician: Referring Provider: Treating Provider/Extender:Rocklyn Mayberry, Orlena Sheldon, [place] Dom in Treatment: 0 Verbal / Phone Orders: No Diagnosis Coding Discharge From [facility] Services Discharge from [place] - patient to move every 2 hours to relieve pressure Electronic Signature(s) Signed: 08/05/2019 5:53:46 PM By: Linton Ham MD Signed: 08/06/2019 9:49:12 AM By: Carlene Coria RN Entered By: Carlene Coria on 08/05/2019 15:32:16 -------------------------------------------------------------------------------- Problem List Details Patient Name: Date of Service: [name]. 08/05/2019 1:15 PM Medical Record [id number] Patient Account Number: [id number]  Date of Birth/Sex: Treating RN: Dec 19, 1932 (85 y.o. 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 08/05/2019 1:15 PM Medical Record [id number] Patient Account Number: [id number] Date of Birth/Sex: Treating RN: [birth year] (85 y.o. M) Primary Care Provider: Abelino Derrick Other Clinician: Referring Provider: Treating Provider/Extender:Brooks Stotz, Orlena Sheldon, [place] Dom in Treatment: 0 Subjective Chief Complaint Information obtained from Patient 08/05/2019; patient is here for review of pressure ulcers on his  buttock History of Present Illness (HPI) ADMISSION 08/05/19 This is an 85 year old man who arrives in clinic accompanied by his son. 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 Apparently noted recently to have a stage I pressure area on the left buttock. He does not have a wound history. He does have been air fluidized mattress. They have a wheelchair cushion as well as a pillow over the top of this. He is not incontinent of urine or bowel. Past medical history includes hypertension, osteoarthritis of the knee, atrial fibrillation, history of mitral valve repair, right hemisphere CVA with left hemiparesis, history of a DVT Patient History Information obtained from Patient. Allergies penicillin (Severity: Moderate, Reaction: itching), Novocain (Reaction: irregular heart beat) Social History Never smoker, Marital Status - Married, Alcohol Use - Never, Drug Use - No History, Caffeine Use - Never. Medical History Cardiovascular Patient has history of Arrhythmia - Atrial Flutter/Atrial Fib, Coronary Artery Disease, Hypertension Neurologic Denies history of Paraplegia Medical And Surgical History Notes Cardiovascular Mitral Valve Repair, Genitourinary BPH Neurologic CVA 2018, left side hemiparesis Review of Systems (ROS) Constitutional Symptoms (General Health) Denies complaints or symptoms of Fatigue, Fever, Chills, Marked Weight Change. Eyes Denies complaints or symptoms of Dry Eyes, Vision Changes, Glasses / Contacts. Ear/Nose/Mouth/Throat Denies complaints or symptoms of Chronic sinus problems or rhinitis. Respiratory Denies complaints or symptoms of Chronic or frequent coughs, Shortness of Breath. Cardiovascular Denies complaints or symptoms of Chest pain. Gastrointestinal Denies complaints or symptoms of Frequent diarrhea, Nausea, Vomiting. Endocrine Denies complaints or symptoms of Heat/cold intolerance. Genitourinary Denies  complaints or symptoms of Frequent urination. Integumentary (Skin) Denies complaints or symptoms of Wounds. Musculoskeletal Complains or has symptoms of Muscle Weakness. Neurologic Denies complaints or symptoms of Numbness/parasthesias. Psychiatric Denies complaints or symptoms of Claustrophobia, Suicidal. Objective Constitutional Patient is hypertensive.. Pulse regular and within target range for patient.Marland Kitchen Respirations regular, non-labored and within target range.. Temperature is normal and within the target range for the patient.Marland Kitchen Appears in no distress. Vitals Time Taken: 2:50 PM, Height: 66 in, Source: Stated, Weight: 160 lbs, Source: Stated, BMI: 25.8, Temperature: 98.2 F, Pulse: 62 bpm, Respiratory Rate: 18 breaths/min, Blood Pressure: 155/71 mmHg. Eyes Conjunctivae clear. No discharge.no icterus. Respiratory work of breathing is normal. Bilateral breath sounds are clear and equal in all lobes with no wheezes, rales or rhonchi.. Cardiovascular Heart rhythm and rate regular, without murmur or gallop.. Gastrointestinal (GI) Abdomen is soft and non-distended without masses or tenderness.. Genitourinary (GU) Not distended. Neurological Compatible with a right middle cerebral artery CVA. Psychiatric appears at normal baseline. General Notes: Wound exam; his buttocks, sacrum,  coccyx and areas of the gluteal cleft were all carefully examined. He has no open wound. The areas over the ischial tuberosities look normal. Integumentary (Hair, Skin) No primary skin disorder is seen. 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 The patient has no open wounds. If he had a stage I area it was not visible today. 2. They seem to have everything in place for pressure relief.  The only thing worrisome is that he cannot turn over from side to side in bed by himself. Electronic Signature(s) Signed: 08/05/2019 5:53:46 PM By: Linton Ham MD Entered By: Linton Ham on 08/05/2019 15:56:32 -------------------------------------------------------------------------------- HxROS Details Patient Name: Date of Service: [name]. 08/05/2019 1:15 PM Medical Record [id number] Patient Account Number: [id number] Date of Birth/Sex: Treating RN: [birth year] (85 y.o. [name] Primary Care Provider: Abelino Derrick Other Clinician: Referring Provider: Treating Provider/Extender:Kamryn Messineo, Orlena Sheldon, [place] Dom in Treatment: 0 Information Obtained From Patient Constitutional Symptoms (General Health) Complaints and Symptoms: Negative for: Fatigue; Fever; Chills; Marked Weight Change Eyes Complaints and Symptoms: Negative for: Dry Eyes; Vision Changes; Glasses / Contacts Ear/Nose/Mouth/Throat Complaints and Symptoms: Negative for: Chronic sinus problems or rhinitis Respiratory Complaints and Symptoms: Negative for: Chronic or frequent coughs; Shortness of Breath Cardiovascular Complaints and Symptoms: Negative for: Chest pain Medical History: Positive for: Arrhythmia - Atrial Flutter/Atrial Fib; Coronary Artery Disease; Hypertension Past Medical History Notes: Mitral Valve Repair, Gastrointestinal Complaints and Symptoms: Negative for: Frequent diarrhea; Nausea; Vomiting Endocrine Complaints and Symptoms: Negative for: Heat/cold intolerance Genitourinary Complaints and Symptoms: Negative for: Frequent urination Medical History: Past Medical History Notes: BPH Integumentary (Skin) Complaints and Symptoms: Negative for: Wounds Musculoskeletal Complaints and Symptoms: Positive for: Muscle Weakness Neurologic Complaints and Symptoms: Negative for: Numbness/parasthesias Medical History: Negative for: Paraplegia Past Medical  History Notes: CVA 2018, left side hemiparesis Psychiatric Complaints and Symptoms: Negative for: Claustrophobia; Suicidal Hematologic/Lymphatic Immunological Oncologic Immunizations Pneumococcal Vaccine: Received Pneumococcal Vaccination: Yes Implantable Devices None Family and Social History Never smoker; Marital Status - Married; Alcohol Use: Never; Drug Use: No History; Caffeine Use: Never; Financial Concerns: No; Food, Clothing or Shelter Needs: No; Support System Lacking: No; Transportation Concerns: No Engineer, maintenance) Signed: 08/05/2019 5:53:46 PM By: Linton Ham MD Signed: 08/07/2019 5:32:06 PM By: Levan Hurst RN, BSN Entered By: Levan Hurst on 08/05/2019 15:12:49 -------------------------------------------------------------------------------- SuperBill Details Patient Name: Date of Service: [name]. 08/05/2019 Medical Record [id number] Patient Account Number: [id number] Date of Birth/Sex: Treating RN: 1933-08-12 (85 y.o. 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

## 2019-08-07 NOTE — Progress Notes (Signed)
Brian, [name] ([id number]) Visit Report for 08/05/2019 Abuse/Suicide Risk Screen Details Patient Name: Date of Service: Brian [name], Brian [name] 08/05/2019 1:15 PM Medical Record [id number] Patient Account Number: [id number] Date of Birth/Sex: Treating RN: [birth year] (85 y.o. [name] Primary Care Kordell Jafri: Abelino Derrick Other Clinician: Referring Parke Jandreau: Treating Son Barkan/Extender:Robson, Orlena Sheldon, [place] Dom in Treatment: 0 Abuse/Suicide Risk Screen Items Answer ABUSE RISK SCREEN: Has anyone close to you tried to hurt or harm you recentlyo No Do you feel uncomfortable with anyone in your familyo No Has anyone forced you do things that you didnt want to doo No Electronic Signature(s) Signed: 08/07/2019 5:32:06 PM By: Levan Hurst RN, BSN Entered By: Levan Hurst on 08/05/2019 15:12:58 -------------------------------------------------------------------------------- Activities of Daily Living Details Patient Name: Date of Service: Brian, [name] 08/05/2019 1:15 PM Medical Record [id number] Patient Account Number: [id number] Date of Birth/Sex: Treating RN: [birth year] (85 y.o. [name] Primary Care Tricha Ruggirello: Abelino Derrick Other Clinician: Referring Abishai Viegas: Treating Raijon Lindfors/Extender:Robson, Orlena Sheldon, [place] Dom in Treatment: 0 Activities of Daily Living Items Answer Activities of Daily Living (Please select one for each item) Drive Automobile Not Able Take Medications Need Assistance Use Telephone Need Assistance Care for Appearance Need Assistance Use Toilet Need Assistance Bath / Shower Need Assistance Dress Self Need Assistance Feed Self Need Assistance Walk Need Assistance Get In / Out Bed Need Assistance Housework Need Assistance Prepare Meals Need Assistance Handle Money Need Assistance Shop for Self Need Assistance Electronic Signature(s) Signed: 08/07/2019 5:32:06 PM By: Levan Hurst RN, BSN Entered  By: Levan Hurst on 08/05/2019 15:13:54 -------------------------------------------------------------------------------- Education Screening Details Patient Name: Date of Service: Brian [name] 08/05/2019 1:15 PM Medical Record [id number] Patient Account Number: [id number] Date of Birth/Sex: Treating RN: 09-Mar-1933 (86 y.o. [name] Primary Care Eligh Rybacki: Abelino Derrick Other Clinician: Referring Lumen Brinlee: Treating Malynn Lucy/Extender:Robson, Orlena Sheldon, [place] Dom in Treatment: 0 Primary Learner Assessed: Patient Learning Preferences/Education Level/Primary Language Learning Preference: Explanation, Demonstration, Printed Material Highest Education Level: College or Above Preferred Language: English Cognitive Barrier Language Barrier: No Translator Needed: No Memory Deficit: No Emotional Barrier: No Cultural/Religious Beliefs Affecting Medical Care: No Physical Barrier Impaired Vision: No Impaired Hearing: No Decreased Hand dexterity: No Knowledge/Comprehension Knowledge Level: High Comprehension Level: High Ability to understand written High instructions: Ability to understand verbal High instructions: Motivation Anxiety Level: Calm Cooperation: Cooperative Education Importance: Acknowledges Need Interest in Health Problems: Asks Questions Perception: Coherent Willingness to Engage in Self- High Management Activities: Readiness to Engage in Self- High Management Activities: Electronic Signature(s) Signed: 08/07/2019 5:32:06 PM By: Levan Hurst RN, BSN Entered By: Levan Hurst on 08/05/2019 15:14:15 -------------------------------------------------------------------------------- Fall Risk Assessment Details Patient Name: Date of Service: Brian [name] 08/05/2019 1:15 PM Medical Record [id number] Patient Account Number: [id number] Date of Birth/Sex: Treating RN: [birth year] (85 y.o. [name] Primary Care  Demarie Uhlig: Abelino Derrick Other Clinician: Referring Stepfanie Yott: Treating Kely Dohn/Extender:Robson, Orlena Sheldon, [place] Dom in Treatment: 0 Fall Risk Assessment Items Have you had 2 or more falls in the last 12 monthso 0 No Have you had any fall that resulted in injury in the last 12 monthso 0 No FALLS RISK SCREEN History of falling - immediate or within 3 months 0 No Secondary diagnosis (Do you have 2 or more medical diagnoseso) 0 No Ambulatory aid None/bed rest/wheelchair/nurse 0 Yes Crutches/cane/walker 0 No Furniture 0 No Intravenous therapy Access/Saline/Heparin Lock 0 No Weak (short steps with or without shuffle, stooped but able to lift head 0 No while walking, may seek support from furniture) Impaired (  short steps with shuffle, may have difficulty arising from chair, 20 Yes head down, impaired balance) Mental Status Oriented to own ability 0 Yes Overestimates or forgets limitations 0 No Risk Level: Low Risk Score: 20 Electronic Signature(s) Signed: 08/07/2019 5:32:06 PM By: Levan Hurst RN, BSN Entered By: Levan Hurst on 08/05/2019 15:15:00 -------------------------------------------------------------------------------- Nutrition Risk Screening Details Patient Name: Date of Service: Brian, [name] 08/05/2019 1:15 PM Medical Record [id number] Patient Account Number: [id number] Date of Birth/Sex: Treating RN: 1932/09/29 (85 y.o. [name] Primary Care Norm Wray: Abelino Derrick Other Clinician: Referring Jaelie Aguilera: Treating Jakia Kennebrew/Extender:Robson, Orlena Sheldon, [place] Dom in Treatment: 0 Height (in): 66 Weight (lbs): 160 Body Mass Index (BMI): 25.8 Nutrition Risk Screening Items Score Screening NUTRITION RISK SCREEN: I have an illness or condition that made me change the kind and/or 0 No amount of food I eat I eat fewer than two meals per day 0 No I eat few fruits and vegetables, or milk products 0 No I have three or more drinks of  beer, liquor or wine almost every day 0 No I have tooth or mouth problems that make it hard for me to eat 0 No I don't always have enough money to buy the food I need 0 No I eat alone most of the time 0 No I take three or more different prescribed or over-the-counter drugs a day 1 Yes 0 No Without wanting to, I have lost or gained 10 pounds in the last six months I am not always physically able to shop, cook and/or feed myself 2 Yes Nutrition Protocols Good Risk Protocol Provide education on Moderate Risk Protocol 0 nutrition High Risk Proctocol Risk Level: Moderate Risk Score: 3 Electronic Signature(s) Signed: 08/07/2019 5:32:06 PM By: Levan Hurst RN, BSN Entered By: Levan Hurst on 08/05/2019 15:15:42

## 2019-08-07 NOTE — Telephone Encounter (Signed)
Pt has arthritis and would like to try new medication enbrel and he would like to make sure that medication will not interfer with his other medications . [facility] in [place]

## 2019-08-08 DIAGNOSIS — L89322 Pressure ulcer of left buttock, stage 2: Secondary | ICD-10-CM | POA: Diagnosis not present

## 2019-08-08 DIAGNOSIS — I69354 Hemiplegia and hemiparesis following cerebral infarction affecting left non-dominant side: Secondary | ICD-10-CM | POA: Diagnosis not present

## 2019-08-08 DIAGNOSIS — I10 Essential (primary) hypertension: Secondary | ICD-10-CM | POA: Diagnosis not present

## 2019-08-08 DIAGNOSIS — M150 Primary generalized (osteo)arthritis: Secondary | ICD-10-CM | POA: Diagnosis not present

## 2019-08-08 DIAGNOSIS — I4892 Unspecified atrial flutter: Secondary | ICD-10-CM | POA: Diagnosis not present

## 2019-08-08 DIAGNOSIS — D649 Anemia, unspecified: Secondary | ICD-10-CM | POA: Diagnosis not present

## 2019-08-10 DIAGNOSIS — I69354 Hemiplegia and hemiparesis following cerebral infarction affecting left non-dominant side: Secondary | ICD-10-CM | POA: Diagnosis not present

## 2019-08-10 DIAGNOSIS — M150 Primary generalized (osteo)arthritis: Secondary | ICD-10-CM | POA: Diagnosis not present

## 2019-08-10 DIAGNOSIS — L89322 Pressure ulcer of left buttock, stage 2: Secondary | ICD-10-CM | POA: Diagnosis not present

## 2019-08-10 DIAGNOSIS — D649 Anemia, unspecified: Secondary | ICD-10-CM | POA: Diagnosis not present

## 2019-08-10 DIAGNOSIS — I10 Essential (primary) hypertension: Secondary | ICD-10-CM | POA: Diagnosis not present

## 2019-08-10 DIAGNOSIS — I4892 Unspecified atrial flutter: Secondary | ICD-10-CM | POA: Diagnosis not present

## 2019-08-12 DIAGNOSIS — H9113 Presbycusis, bilateral: Secondary | ICD-10-CM | POA: Insufficient documentation

## 2019-08-12 DIAGNOSIS — H833X3 Noise effects on inner ear, bilateral: Secondary | ICD-10-CM | POA: Insufficient documentation

## 2019-08-28 ENCOUNTER — Other Ambulatory Visit: Payer: Self-pay | Admitting: Family Medicine

## 2019-08-28 DIAGNOSIS — I6300 Cerebral infarction due to thrombosis of unspecified precerebral artery: Secondary | ICD-10-CM

## 2019-08-28 DIAGNOSIS — I480 Paroxysmal atrial fibrillation: Secondary | ICD-10-CM

## 2019-09-01 ENCOUNTER — Telehealth: Payer: Self-pay | Admitting: Cardiovascular Disease

## 2019-09-01 NOTE — Telephone Encounter (Signed)
Patient is fine to take Enbrel

## 2019-09-01 NOTE — Telephone Encounter (Signed)
Patient calling for advice on whether he would be able to take Enbrel for his arthritis. He states he saw an advertisement on the tv and would like to know if it would help him or interfere with his medications.

## 2019-09-01 NOTE — Telephone Encounter (Signed)
Pt c/o medication issue:  1. Name of Medication: Enbrel (etanercept)  2. How are you currently taking this medication (dosage and times per day)? Is not currently taking medication   3. Are you having a reaction (difficulty breathing--STAT)? No  4. What is your medication issue? Patient is calling wanting to know if it is okay for him to begin taking Enbrel with the current medications he is already on. Please advise.

## 2019-09-01 NOTE — Telephone Encounter (Signed)
Left detailed message as noted ablove

## 2019-09-06 ENCOUNTER — Other Ambulatory Visit: Payer: Self-pay | Admitting: Family Medicine

## 2019-09-06 DIAGNOSIS — J301 Allergic rhinitis due to pollen: Secondary | ICD-10-CM

## 2019-09-21 ENCOUNTER — Other Ambulatory Visit: Payer: Self-pay | Admitting: Gastroenterology

## 2019-09-27 ENCOUNTER — Other Ambulatory Visit: Payer: Self-pay | Admitting: Family Medicine

## 2019-09-27 DIAGNOSIS — K219 Gastro-esophageal reflux disease without esophagitis: Secondary | ICD-10-CM

## 2019-10-02 ENCOUNTER — Other Ambulatory Visit: Payer: Self-pay | Admitting: Family Medicine

## 2019-10-02 DIAGNOSIS — F419 Anxiety disorder, unspecified: Secondary | ICD-10-CM

## 2019-10-08 ENCOUNTER — Telehealth: Payer: Self-pay | Admitting: Cardiovascular Disease

## 2019-10-08 NOTE — Telephone Encounter (Signed)
  Pt c/o medication issue:  1. Name of Medication: Does not remember states it is a gel shot  2. How are you currently taking this medication (dosage and times per day)? Not currently taking  3. Are you having a reaction (difficulty breathing--STAT)? No  4. What is your medication issue? Patient is requesting to take a gel shot in his knee for his arthritis from [facility]. He states he does not remember what the name of the medication is and would like to know if it would be okay for him to take it. He also gave the number for [facility] in case the nurse wants to reach out to them: [phone].

## 2019-10-08 NOTE — Telephone Encounter (Signed)
MyChart message sent to patient asking him to locate the name of the medication in question so provider can review

## 2019-10-10 ENCOUNTER — Other Ambulatory Visit: Payer: Self-pay | Admitting: Family Medicine

## 2019-10-10 DIAGNOSIS — M79672 Pain in left foot: Secondary | ICD-10-CM

## 2019-10-10 DIAGNOSIS — M19012 Primary osteoarthritis, left shoulder: Secondary | ICD-10-CM

## 2019-10-10 DIAGNOSIS — M25559 Pain in unspecified hip: Secondary | ICD-10-CM

## 2019-10-10 DIAGNOSIS — M169 Osteoarthritis of hip, unspecified: Secondary | ICD-10-CM

## 2019-10-10 NOTE — Telephone Encounter (Signed)
I spoke with pt and he want Dr. Ethelene Hal to know that he has an appointment for a knee injection( Hyalaluronic) for next week.  Pt would like to know if it will interfere with medication, Eliquis.   Pt also requesting a refill on Oxycodone  Last OV 07/18/19 Last fill 07/09/19  #180/0 Please advise.

## 2019-10-10 NOTE — Telephone Encounter (Signed)
Patient also has a medication question. CB [phone]

## 2019-10-10 NOTE — Telephone Encounter (Signed)
Patient father is calling and requesting Oxycodone sent to [facility] in [place] on [address]. CB is [phone]

## 2019-10-10 NOTE — Telephone Encounter (Signed)
Message from Fidel Levy, RN sent at 10/10/2019 12:28 PM EST -----  Please advise patient on hyaluronic acid

## 2019-10-13 NOTE — Telephone Encounter (Signed)
Patient son is calling back to check status of refill for oxycodone sent to [facility] to [place]. CB for son is [phone] or for patient is  [phone]

## 2019-10-13 NOTE — Telephone Encounter (Signed)
No, it shouldn't.

## 2019-10-14 MED ORDER — OXYCODONE-ACETAMINOPHEN 5-325 MG PO TABS: 1.0000 | ORAL_TABLET | Freq: Two times a day (BID) | ORAL | 0 refills | Status: DC

## 2019-10-14 NOTE — Telephone Encounter (Signed)
Patient aware that Rx sent to pharmacy  

## 2019-10-14 NOTE — Telephone Encounter (Signed)
Last OV 07/22/19 Last fill 07/09/19  #180/0

## 2019-10-14 NOTE — Telephone Encounter (Signed)
Request sent to Dr. Ethelene Hal.

## 2019-10-15 NOTE — Telephone Encounter (Signed)
It is OK for him to have this shot.  If they need to hold Eliquis up to 3 days that is OK.

## 2019-10-16 ENCOUNTER — Telehealth: Payer: Self-pay | Admitting: Family Medicine

## 2019-10-16 ENCOUNTER — Encounter: Payer: Self-pay | Admitting: Family Medicine

## 2019-10-16 NOTE — Telephone Encounter (Signed)
Dr. Kremer please advise message below.  

## 2019-10-16 NOTE — Telephone Encounter (Signed)
Patient calling stating he is worried about being taken off of eliquis. Please advise.

## 2019-10-16 NOTE — Telephone Encounter (Signed)
Patient aware of there will be no problem with Hyalobiuronic gel will be okay to get while he's on his daily medications.

## 2019-10-16 NOTE — Telephone Encounter (Signed)
No problem there.

## 2019-10-16 NOTE — Telephone Encounter (Signed)
Patient is calling and wanted to ask Dr. Ethelene Hal if him getting hyalobiuronic gel injections would if affect his other medications. Pls advise. CB is [phone]

## 2019-10-16 NOTE — Telephone Encounter (Signed)
Left message to call back  

## 2019-10-17 NOTE — Telephone Encounter (Signed)
Advised patient, verbalized understanding  

## 2019-10-17 NOTE — Telephone Encounter (Signed)
I would prefer that he not come off Eliquis.  However if his orthopedic surgeon requires it that is okay.  If they are okay with doing the injection off Eliquis and not even better.  His daily risk of having a stroke off Eliquis is exceedingly low.

## 2019-10-18 DIAGNOSIS — E785 Hyperlipidemia, unspecified: Secondary | ICD-10-CM | POA: Diagnosis not present

## 2019-10-18 DIAGNOSIS — J019 Acute sinusitis, unspecified: Secondary | ICD-10-CM | POA: Diagnosis not present

## 2019-10-18 DIAGNOSIS — G8190 Hemiplegia, unspecified affecting unspecified side: Secondary | ICD-10-CM | POA: Diagnosis not present

## 2019-10-18 DIAGNOSIS — I10 Essential (primary) hypertension: Secondary | ICD-10-CM | POA: Diagnosis not present

## 2019-10-26 ENCOUNTER — Other Ambulatory Visit: Payer: Self-pay | Admitting: Gastroenterology

## 2019-10-27 DIAGNOSIS — G894 Chronic pain syndrome: Secondary | ICD-10-CM | POA: Diagnosis not present

## 2019-10-28 ENCOUNTER — Other Ambulatory Visit: Payer: Self-pay | Admitting: Gastroenterology

## 2019-10-28 ENCOUNTER — Ambulatory Visit: Payer: Medicare Other | Admitting: Family Medicine

## 2019-10-28 DIAGNOSIS — M19012 Primary osteoarthritis, left shoulder: Secondary | ICD-10-CM | POA: Diagnosis not present

## 2019-10-28 DIAGNOSIS — M1712 Unilateral primary osteoarthritis, left knee: Secondary | ICD-10-CM | POA: Diagnosis not present

## 2019-10-28 DIAGNOSIS — M1711 Unilateral primary osteoarthritis, right knee: Secondary | ICD-10-CM | POA: Diagnosis not present

## 2019-10-28 DIAGNOSIS — M25562 Pain in left knee: Secondary | ICD-10-CM | POA: Diagnosis not present

## 2019-10-28 DIAGNOSIS — M170 Bilateral primary osteoarthritis of knee: Secondary | ICD-10-CM | POA: Diagnosis not present

## 2019-11-02 ENCOUNTER — Other Ambulatory Visit: Payer: Self-pay | Admitting: Family Medicine

## 2019-11-02 DIAGNOSIS — I6300 Cerebral infarction due to thrombosis of unspecified precerebral artery: Secondary | ICD-10-CM

## 2019-11-04 DIAGNOSIS — M1712 Unilateral primary osteoarthritis, left knee: Secondary | ICD-10-CM | POA: Diagnosis not present

## 2019-11-04 DIAGNOSIS — M1711 Unilateral primary osteoarthritis, right knee: Secondary | ICD-10-CM | POA: Diagnosis not present

## 2019-11-04 DIAGNOSIS — M25511 Pain in right shoulder: Secondary | ICD-10-CM | POA: Diagnosis not present

## 2019-11-04 DIAGNOSIS — M170 Bilateral primary osteoarthritis of knee: Secondary | ICD-10-CM | POA: Diagnosis not present

## 2019-11-06 ENCOUNTER — Other Ambulatory Visit: Payer: Self-pay

## 2019-11-06 ENCOUNTER — Encounter: Payer: Self-pay | Admitting: Family Medicine

## 2019-11-06 ENCOUNTER — Ambulatory Visit (INDEPENDENT_AMBULATORY_CARE_PROVIDER_SITE_OTHER): Payer: Medicare Other | Admitting: Family Medicine

## 2019-11-06 VITALS — BP 112/62 | HR 62 | Temp 98.5°F | Ht 66.0 in

## 2019-11-06 DIAGNOSIS — M25559 Pain in unspecified hip: Secondary | ICD-10-CM | POA: Diagnosis not present

## 2019-11-06 DIAGNOSIS — I10 Essential (primary) hypertension: Secondary | ICD-10-CM | POA: Diagnosis not present

## 2019-11-06 DIAGNOSIS — F419 Anxiety disorder, unspecified: Secondary | ICD-10-CM

## 2019-11-06 DIAGNOSIS — M159 Polyosteoarthritis, unspecified: Secondary | ICD-10-CM

## 2019-11-06 DIAGNOSIS — M8949 Other hypertrophic osteoarthropathy, multiple sites: Secondary | ICD-10-CM

## 2019-11-06 DIAGNOSIS — M169 Osteoarthritis of hip, unspecified: Secondary | ICD-10-CM

## 2019-11-06 DIAGNOSIS — R5381 Other malaise: Secondary | ICD-10-CM

## 2019-11-06 DIAGNOSIS — E7800 Pure hypercholesterolemia, unspecified: Secondary | ICD-10-CM | POA: Diagnosis not present

## 2019-11-06 LAB — LDL CHOLESTEROL, DIRECT: Direct LDL: 64 mg/dL

## 2019-11-06 LAB — BASIC METABOLIC PANEL
BUN: 15 mg/dL (ref 6–23)
CO2: 28 mEq/L (ref 19–32)
Calcium: 9.1 mg/dL (ref 8.4–10.5)
Chloride: 99 mEq/L (ref 96–112)
Creatinine, Ser: 0.49 mg/dL (ref 0.40–1.50)
GFR: 161.83 mL/min (ref >60.00)
Glucose, Bld: 91 mg/dL (ref 70–99)
Potassium: 4.4 mEq/L (ref 3.5–5.1)
Sodium: 131 mEq/L — ABNORMAL LOW (ref 135–145)

## 2019-11-06 LAB — CBC
HCT: 44.9 % (ref 39.0–52.0)
Hemoglobin: 15.1 g/dL (ref 13.0–17.0)
MCHC: 33.7 g/dL (ref 30.0–36.0)
MCV: 95.8 fl (ref 78.0–100.0)
Platelets: 241 10*3/uL (ref 150.0–400.0)
RBC: 4.68 Mil/uL (ref 4.22–5.81)
RDW: 13.6 % (ref 11.5–15.5)
WBC: 8.4 10*3/uL (ref 4.0–10.5)

## 2019-11-06 NOTE — Progress Notes (Signed)
Established Patient Office Visit  Subjective:  Patient ID: Brian [name], male    DOB: October 03, 1932  Age: 86 y.o. MRN: [id number]  CC:  Chief Complaint  Patient presents with  . Follow-up    routine check, pt would like ears checked.     HPI [name] presents for follow-up of his hypertension, elevated cholesterol, anxiety and chronic pain.  Patient is debilitated and is wheelchair-bound.  He is accompanied by his son today.  Continues to live at home.  Continues all current medicines.   Past Medical History:  Diagnosis Date  . Anemia   . Atrial flutter ([place])   . BPH (benign prostatic hypertrophy)   . Epistaxis   . Hemorrhage of gastrointestinal tract, unspecified   . History of open heart surgery   . Hypertension   . Osteoarthrosis, unspecified whether generalized or localized, unspecified site   . Personal history of venous thrombosis and embolism   . Rosacea   . Stroke ([place])   . TIA (transient ischemic attack)     Past Surgical History:  Procedure Laterality Date  . APPENDECTOMY    . HERNIA REPAIR    . IR GENERIC HISTORICAL  05/04/2016   IR PERCUTANEOUS ART THROMBECTOMY/INFUSION INTRACRANIAL INC DIAG ANGIO 05/04/2016 [name], MD [facility]  . IR GENERIC HISTORICAL  05/04/2016   IR ANGIO VERTEBRAL SEL SUBCLAVIAN INNOMINATE UNI R MOD SED 05/04/2016 [name], MD [facility]  . IR GENERIC HISTORICAL  06/07/2016   IR RADIOLOGIST EVAL & MGMT 06/07/2016 [facility]  . JOINT REPLACEMENT    . MITRAL VALVE REPAIR    . mvp repair    . RADIOLOGY WITH ANESTHESIA N/A 05/04/2016   Procedure: RADIOLOGY WITH ANESTHESIA;  Surgeon: [name], MD;  Location: [facility];  Service: Radiology;  Laterality: N/A;  . TOTAL HIP ARTHROPLASTY      Family History  Problem Relation Age of Onset  . Rheumatic fever Mother   . Coronary artery disease Father     Social History   Socioeconomic History  . Marital status: Married    Spouse name: Not on file  .  Number of children: Not on file  . Years of education: Not on file  . Highest education level: Not on file  Occupational History  . Not on file  Tobacco Use  . Smoking status: Former Research scientist (life sciences)  . Smokeless tobacco: Never Used  . Tobacco comment: quit 50 yr ago  Substance and Sexual Activity  . Alcohol use: No  . Drug use: No  . Sexual activity: Not on file  Other Topics Concern  . Not on file  Social History Narrative  . Not on file   Social Determinants of Health   Financial Resource Strain:   . Difficulty of Paying Living Expenses:   Food Insecurity:   . Worried About Charity fundraiser in the Last Year:   . Arboriculturist in the Last Year:   Transportation Needs:   . Film/video editor (Medical):   Marland Kitchen Lack of Transportation (Non-Medical):   Physical Activity:   . Days of Exercise per Week:   . Minutes of Exercise per Session:   Stress:   . Feeling of Stress :   Social Connections:   . Frequency of Communication with Friends and Family:   . Frequency of Social Gatherings with Friends and Family:   . Attends Religious Services:   . Active Member of Clubs or Organizations:   . Attends  Club or Organization Meetings:   Marland Kitchen Marital Status:   Intimate Partner Violence:   . Fear of Current or Ex-Partner:   . Emotionally Abused:   Marland Kitchen Physically Abused:   . Sexually Abused:     Outpatient Medications Prior to Visit  Medication Sig Dispense Refill  . ALPRAZolam (XANAX) 0.25 MG tablet TAKE 1 TABLET(0.25 MG) BY MOUTH AT BEDTIME 90 tablet 0  . cetirizine (ZYRTEC) 10 MG tablet TAKE 1 TABLET(10 MG) BY MOUTH AT BEDTIME 90 tablet 2  . ELIQUIS 2.5 MG TABS tablet TAKE 1 TABLET(2.5 MG) BY MOUTH TWICE DAILY 180 tablet 1  . finasteride (PROSCAR) 5 MG tablet Take 1 tablet (5 mg total) by mouth daily. 90 tablet 3  . hydrocortisone (ANUCORT-HC) 25 MG suppository UNWRAP AND INSERT 1 SUPPOSITORY RECTALLY TWICE DAILY 30 suppository 0  . hydrocortisone (PROCTOZONE-HC) 2.5 % rectal cream USE  RECTALLY TWICE DAILY 30 g 0  . hydrocortisone-pramoxine (ANALPRAM-HC) 2.5-1 % rectal cream Place 1 application rectally 2 (two) times daily as needed for hemorrhoids.     . lidocaine (LIDODERM) 5 % APPLY 1 PATCH TOPICALLY ONCE DAILY.MAY WEAR UPTO 12 HOURS MAXIMUM IN A DAY.    . metoprolol succinate (TOPROL-XL) 25 MG 24 hr tablet Take 1 tablet (25 mg total) by mouth daily. 90 tablet 3  . oxyCODONE-acetaminophen (PERCOCET/ROXICET) 5-325 MG tablet Take 1 tablet by mouth 2 (two) times daily. 180 tablet 0  . pantoprazole (PROTONIX) 40 MG tablet TAKE 1 TABLET(40 MG) BY MOUTH DAILY 90 tablet 0  . pravastatin (PRAVACHOL) 40 MG tablet TAKE 1 TABLET(40 MG) BY MOUTH DAILY 90 tablet 3  . sertraline (ZOLOFT) 50 MG tablet Take 1 tablet (50 mg total) by mouth every morning. 90 tablet 3  . Silver (SILVASORB) GEL Apply daily to wound to keep moist. 44.4 mL 0  . clindamycin (CLEOCIN) 150 MG capsule Take 3 capsules (450 mg total) by mouth 3 (three) times daily. Take three times daily with breakfast, lunch, and dinner. (Patient not taking: Reported on 11/06/2019) 56 capsule 0   No facility-administered medications prior to visit.    Allergies  Allergen Reactions  . Novocain [Procaine Hcl]     Irregular heart beat     . Other     Patient had problem with epidural with his right hip surgery. Went to up extremity instead of lower extremity   . Penicillins Itching    Has patient had a PCN reaction causing immediate rash, facial/tongue/throat swelling, SOB or lightheadedness with hypotension: Unk Has patient had a PCN reaction causing severe rash involving mucus membranes or skin necrosis: Unk Has patient had a PCN reaction that required hospitalization: Unk Has patient had a PCN reaction occurring within the last 10 years: No If all of the above answers are "NO", then may proceed with Cephalosporin use.  No reaction noted    ROS Review of Systems  Constitutional: Negative.   HENT: Negative.   Eyes:  Negative for photophobia and visual disturbance.  Respiratory: Negative.   Cardiovascular: Negative.   Gastrointestinal: Negative.   Endocrine: Negative for polyphagia and polyuria.  Genitourinary: Negative.   Musculoskeletal: Positive for arthralgias and gait problem.  Skin: Positive for wound.  Neurological: Positive for weakness.  Hematological: Does not bruise/bleed easily.  Psychiatric/Behavioral: The patient is nervous/anxious.       Objective:    Physical Exam  Constitutional: He is oriented to person, place, and time. He appears well-developed and well-nourished. No distress.  HENT:  Head: Normocephalic and atraumatic.  Right Ear: External ear normal.  Left Ear: External ear normal.  Eyes: Conjunctivae are normal. Right eye exhibits no discharge. Left eye exhibits no discharge. No scleral icterus.  Neck: No JVD present. No tracheal deviation present. No thyromegaly present.  Cardiovascular: Normal rate, regular rhythm and normal heart sounds.  Pulmonary/Chest: Effort normal and breath sounds normal. No stridor. No respiratory distress. He has no wheezes. He has no rales.  Musculoskeletal:        General: No edema.  Lymphadenopathy:    He has no cervical adenopathy.  Neurological: He is alert and oriented to person, place, and time.  Skin: Skin is warm and dry. He is not diaphoretic.  Psychiatric: He has a normal mood and affect. His behavior is normal.    BP 112/62   Pulse 62   Temp 98.5 F (36.9 C) (Tympanic)   Ht 5\' 6"  (1.676 m)   SpO2 95%   BMI 25.82 kg/m  Wt Readings from Last 3 Encounters:  07/08/19 160 lb (72.6 kg)  06/24/19 145 lb (65.8 kg)  05/20/19 149 lb (67.6 kg)     There are no preventive care reminders to display for this patient.  There are no preventive care reminders to display for this patient.  Lab Results  Component Value Date   TSH 2.99 09/26/2016   Lab Results  Component Value Date   WBC 7.0 03/18/2019   HGB 15.5 03/18/2019    HCT 46.0 03/18/2019   MCV 96.6 03/18/2019   PLT 231.0 03/18/2019   Lab Results  Component Value Date   NA 137 03/18/2019   K 4.9 03/18/2019   CO2 31 03/18/2019   GLUCOSE 159 (H) 03/18/2019   BUN 16 03/18/2019   CREATININE 0.64 03/18/2019   BILITOT 0.8 05/12/2018   ALKPHOS 73 05/12/2018   AST 25 05/12/2018   ALT 24 05/12/2018   PROT 6.4 (L) 05/12/2018   ALBUMIN 3.5 05/12/2018   CALCIUM 9.5 03/18/2019   ANIONGAP 13 11/03/2018   GFR 119.09 03/18/2019   Lab Results  Component Value Date   CHOL 134 04/16/2018   Lab Results  Component Value Date   HDL 51 04/16/2018   Lab Results  Component Value Date   LDLCALC 55 04/16/2018   Lab Results  Component Value Date   TRIG 139 04/16/2018   Lab Results  Component Value Date   CHOLHDL 2.6 04/16/2018   Lab Results  Component Value Date   HGBA1C 4.9 04/08/2019      Assessment & Plan:   Problem List Items Addressed This Visit      Cardiovascular and Mediastinum   Essential hypertension - Primary   Relevant Orders   Basic metabolic panel   CBC     Musculoskeletal and Integument   Osteoarthritis   Osteoarthritis of hip     Other   Arthralgia of hip   Anxiety   Elevated cholesterol   Relevant Orders   LDL cholesterol, direct   Debilitated      No orders of the defined types were placed in this encounter.   Follow-up: Return in about 6 months (around 05/08/2020).    Continue current medications and follow-up in 6 months. Libby Maw, MD

## 2019-11-11 DIAGNOSIS — M1712 Unilateral primary osteoarthritis, left knee: Secondary | ICD-10-CM | POA: Diagnosis not present

## 2019-11-11 DIAGNOSIS — M1711 Unilateral primary osteoarthritis, right knee: Secondary | ICD-10-CM | POA: Diagnosis not present

## 2019-11-11 DIAGNOSIS — M170 Bilateral primary osteoarthritis of knee: Secondary | ICD-10-CM | POA: Diagnosis not present

## 2019-11-12 ENCOUNTER — Telehealth: Payer: Self-pay | Admitting: Family Medicine

## 2019-11-12 NOTE — Telephone Encounter (Signed)
Returned patients call, no answer LMTCB 

## 2019-11-12 NOTE — Telephone Encounter (Addendum)
Patient is calling and wanted to see if his lab results were back.  CB is [phone]

## 2019-11-14 ENCOUNTER — Other Ambulatory Visit: Payer: Self-pay | Admitting: Gastroenterology

## 2019-11-14 ENCOUNTER — Telehealth: Payer: Self-pay | Admitting: Family Medicine

## 2019-11-14 NOTE — Telephone Encounter (Signed)
Pt called about getting lab results for him and his son and said he wanted to discuss getting PT

## 2019-11-18 NOTE — Telephone Encounter (Signed)
Called patient to go over labs, spoke with wife who states that she could relay message to both her son and husband because they were not available to come to the phone. I asked if she could have them to give [place] a call when they can.

## 2019-11-18 NOTE — Telephone Encounter (Signed)
Patient is calling back regarding labs. CB is [phone]

## 2019-11-18 NOTE — Telephone Encounter (Signed)
Spoke with patient who verbally understood lab results.

## 2019-11-18 NOTE — Telephone Encounter (Signed)
Duplicate

## 2019-11-27 DIAGNOSIS — I10 Essential (primary) hypertension: Secondary | ICD-10-CM | POA: Diagnosis not present

## 2019-11-27 DIAGNOSIS — Z7901 Long term (current) use of anticoagulants: Secondary | ICD-10-CM | POA: Diagnosis not present

## 2019-11-27 DIAGNOSIS — I6930 Unspecified sequelae of cerebral infarction: Secondary | ICD-10-CM | POA: Diagnosis not present

## 2019-11-27 DIAGNOSIS — Z7401 Bed confinement status: Secondary | ICD-10-CM | POA: Diagnosis not present

## 2019-11-29 DIAGNOSIS — L89159 Pressure ulcer of sacral region, unspecified stage: Secondary | ICD-10-CM | POA: Diagnosis not present

## 2019-11-29 DIAGNOSIS — L03119 Cellulitis of unspecified part of limb: Secondary | ICD-10-CM | POA: Diagnosis not present

## 2019-12-01 ENCOUNTER — Other Ambulatory Visit: Payer: Self-pay | Admitting: Gastroenterology

## 2019-12-04 ENCOUNTER — Telehealth: Payer: Self-pay | Admitting: Gastroenterology

## 2019-12-04 MED ORDER — HYDROCORTISONE ACETATE 25 MG RE SUPP: RECTAL | 0 refills | Status: DC

## 2019-12-04 NOTE — Telephone Encounter (Signed)
Can we refill? Patient has not been seen since 12/2017?

## 2019-12-04 NOTE — Telephone Encounter (Signed)
This is the last refill until he has an office appt with [place] or he can request his PCP to refill this medication instead of having follow up with [place] for refills OK to supply #30, no refills

## 2019-12-04 NOTE — Telephone Encounter (Signed)
Patient scheduled appt for 02/13/20 at 2:10pm. Prescription sent to patient's pharmacy until scheduled appt. Patient verbalized understanding.

## 2019-12-09 ENCOUNTER — Telehealth: Payer: Self-pay | Admitting: Cardiovascular Disease

## 2019-12-09 NOTE — Telephone Encounter (Signed)
Patient left message with answering service. Re: Has question regarding a procedure he my be interested in "cool sculpting" and want to know if he is ok to take procedure, he is on blood thinners.

## 2019-12-09 NOTE — Telephone Encounter (Signed)
Patient's son answered the phone and gave phone to patient. Patient reports he has gained weight since hospitalization 3 weeks ago at [facility]. He also complains of a red spot on his right leg and this is being followed by "Quillian Quince".  Patient unable to give provider's complete name. Patient is also asking about procedure to "freeze fat".  Patient then reported he had memory issues since stroke and requested I speak with his son.  I spoke with patient's son who is requesting refill of Doxycyline for patient. This is being used to treat area on right leg.  Son was not sure who prescribed this and could only give name "Quillian Quince".  I asked him to follow up with Quillian Quince regarding refill and leg issues. Son also reports hospitalization for stroke was in September 2017 and weight gain started at that time.  No recent weight gain.  I then spoke with patient again and asked him to follow up with PCP regarding weight management and possible cool sculpting.  Patient reports he has no other questions for cardiologist at this time.

## 2019-12-15 ENCOUNTER — Telehealth: Payer: Self-pay | Admitting: Family Medicine

## 2019-12-15 NOTE — Progress Notes (Signed)
  Chronic Care Management   Outreach Note  12/15/2019 Name: Brian [name] MRN: [id number] DOB: [birth year]  Referred by: Libby Maw, MD Reason for referral : No chief complaint on file.   An unsuccessful telephone outreach was attempted today. The patient was referred to the pharmacist for assistance with care management and care coordination.   This note is not being shared with the patient for the following reason: To respect privacy (The patient or proxy has requested that the information not be shared).  Follow Up Plan:   Earney [place] Upstream Scheduler

## 2019-12-16 NOTE — Progress Notes (Deleted)
Subjective:   Brian [name] is a 86 y.o. male who presents for Medicare Annual/Subsequent preventive examination.  Review of Systems:  Home Safety/Smoke Alarms: Feels safe in home. Smoke alarms in place.   Male:   CCS-     PSA-  Lab Results  Component Value Date   PSA 1.92 06/03/2012   PSA 1.37 01/23/2011   PSA 1.28 07/04/2010       Objective:    Vitals: There were no vitals taken for this visit.  There is no height or weight on file to calculate BMI.  Advanced Directives 11/03/2018 05/12/2018 03/13/2017 09/05/2016 08/31/2016 08/29/2016 08/24/2016  Does Patient Have a Medical Advance Directive? No Yes No Yes Yes Yes Yes  Type of Advance Directive - [place];Living will [place];Living will [place];Living will [place];Living will  Copy of [place] in Chart? - - - No - copy requested No - copy requested No - copy requested No - copy requested  Would patient like information on creating a medical advance directive? No - Patient declined - Yes (ED - Information included in AVS) - - - -    Tobacco Social History   Tobacco Use  Smoking Status Former Smoker  Smokeless Tobacco Never Used  Tobacco Comment   quit 50 yr ago     Counseling given: Not Answered Comment: quit 35 yr ago   Clinical Intake:                       Past Medical History:  Diagnosis Date  . Anemia   . Atrial flutter ([place])   . BPH (benign prostatic hypertrophy)   . Epistaxis   . Hemorrhage of gastrointestinal tract, unspecified   . History of open heart surgery   . Hypertension   . Osteoarthrosis, unspecified whether generalized or localized, unspecified site   . Personal history of venous thrombosis and embolism   . Rosacea   . Stroke ([place])   . TIA (transient ischemic attack)    Past Surgical History:  Procedure Laterality Date  . APPENDECTOMY    . HERNIA  REPAIR    . IR GENERIC HISTORICAL  05/04/2016   IR PERCUTANEOUS ART THROMBECTOMY/INFUSION INTRACRANIAL INC DIAG ANGIO 05/04/2016 [name], MD [facility]  . IR GENERIC HISTORICAL  05/04/2016   IR ANGIO VERTEBRAL SEL SUBCLAVIAN INNOMINATE UNI R MOD SED 05/04/2016 [name], MD [facility]  . IR GENERIC HISTORICAL  06/07/2016   IR RADIOLOGIST EVAL & MGMT 06/07/2016 [facility]  . JOINT REPLACEMENT    . MITRAL VALVE REPAIR    . mvp repair    . RADIOLOGY WITH ANESTHESIA N/A 05/04/2016   Procedure: RADIOLOGY WITH ANESTHESIA;  Surgeon: [name], MD;  Location: [facility];  Service: Radiology;  Laterality: N/A;  . TOTAL HIP ARTHROPLASTY     Family History  Problem Relation Age of Onset  . Rheumatic fever Mother   . Coronary artery disease Father    Social History   Socioeconomic History  . Marital status: Married    Spouse name: Not on file  . Number of children: Not on file  . Years of education: Not on file  . Highest education level: Not on file  Occupational History  . Not on file  Tobacco Use  . Smoking status: Former Research scientist (life sciences)  . Smokeless tobacco: Never Used  . Tobacco comment: quit 46 yr  ago  Substance and Sexual Activity  . Alcohol use: No  . Drug use: No  . Sexual activity: Not on file  Other Topics Concern  . Not on file  Social History Narrative  . Not on file   Social Determinants of Health   Financial Resource Strain:   . Difficulty of Paying Living Expenses:   Food Insecurity:   . Worried About Charity fundraiser in the Last Year:   . Arboriculturist in the Last Year:   Transportation Needs:   . Film/video editor (Medical):   Marland Kitchen Lack of Transportation (Non-Medical):   Physical Activity:   . Days of Exercise per Week:   . Minutes of Exercise per Session:   Stress:   . Feeling of Stress :   Social Connections:   . Frequency of Communication with Friends and Family:   . Frequency of Social Gatherings with Friends and Family:   .  Attends Religious Services:   . Active Member of Clubs or Organizations:   . Attends Archivist Meetings:   Marland Kitchen Marital Status:     Outpatient Encounter Medications as of 12/17/2019  Medication Sig  . ALPRAZolam (XANAX) 0.25 MG tablet TAKE 1 TABLET(0.25 MG) BY MOUTH AT BEDTIME  . cetirizine (ZYRTEC) 10 MG tablet TAKE 1 TABLET(10 MG) BY MOUTH AT BEDTIME  . clindamycin (CLEOCIN) 150 MG capsule Take 3 capsules (450 mg total) by mouth 3 (three) times daily. Take three times daily with breakfast, lunch, and dinner. (Patient not taking: Reported on 11/06/2019)  . ELIQUIS 2.5 MG TABS tablet TAKE 1 TABLET(2.5 MG) BY MOUTH TWICE DAILY  . finasteride (PROSCAR) 5 MG tablet Take 1 tablet (5 mg total) by mouth daily.  . hydrocortisone (ANUCORT-HC) 25 MG suppository UNWRAP AND INSERT 1 SUPPOSITORY RECTALLY TWICE DAILY  . hydrocortisone (PROCTOZONE-HC) 2.5 % rectal cream USE RECTALLY TWICE DAILY  . hydrocortisone-pramoxine (ANALPRAM-HC) 2.5-1 % rectal cream Place 1 application rectally 2 (two) times daily as needed for hemorrhoids.   . lidocaine (LIDODERM) 5 % APPLY 1 PATCH TOPICALLY ONCE DAILY.MAY WEAR UPTO 12 HOURS MAXIMUM IN A DAY.  . metoprolol succinate (TOPROL-XL) 25 MG 24 hr tablet Take 1 tablet (25 mg total) by mouth daily.  Marland Kitchen oxyCODONE-acetaminophen (PERCOCET/ROXICET) 5-325 MG tablet Take 1 tablet by mouth 2 (two) times daily.  . pantoprazole (PROTONIX) 40 MG tablet TAKE 1 TABLET(40 MG) BY MOUTH DAILY  . pravastatin (PRAVACHOL) 40 MG tablet TAKE 1 TABLET(40 MG) BY MOUTH DAILY  . sertraline (ZOLOFT) 50 MG tablet Take 1 tablet (50 mg total) by mouth every morning.  Cathie Olden (SILVASORB) GEL Apply daily to wound to keep moist.   No facility-administered encounter medications on file as of 12/17/2019.    Activities of Daily Living No flowsheet data found.  Patient Care Team: Libby Maw, MD as PCP - General (Family Medicine) Skeet Latch, MD as PCP - Cardiology (Cardiology)    Assessment:   This is a routine wellness examination for Brian [name]  Physical assessment deferred to PCP.  Exercise Activities and Dietary recommendations   Diet (meal preparation, eat out, water intake, caffeinated beverages, dairy products, fruits and vegetables): {Desc; diets:16563} Breakfast: Lunch:  Dinner:      Goals   None     Fall Risk Fall Risk  11/06/2019 01/22/2018 01/23/2017 09/26/2016 07/25/2016  Falls in the past year? 0 No No Yes No  Number falls in past yr: - - - 1 -  Injury with Fall? - - -  Yes -  Risk Factor Category  - - - High Fall Risk -  Follow up - - - Falls prevention discussed -     Depression Screen PHQ 2/9 Scores 04/30/2018 09/26/2016 09/22/2015 08/10/2014  PHQ - 2 Score 0 0 0 0    Cognitive Function Ad8 score reviewed for issues:  Issues making decisions:  Less interest in hobbies / activities:  Repeats questions, stories (family complaining):  Trouble using ordinary gadgets (microwave, computer, phone):  Forgets the month or year:   Mismanaging finances:   Remembering appts:  Daily problems with thinking and/or memory: Ad8 score is=            Immunization History  Administered Date(s) Administered  . H1N1 09/01/2008  . Influenza Inj Mdck Quad Pf 05/27/2018  . Influenza Split 05/15/2011  . Influenza Whole 04/26/2009, 04/25/2013  . Influenza, High Dose Seasonal PF 05/17/2017, 05/12/2019  . Influenza,inj,Quad PF,6+ Mos 05/10/2016  . Influenza-Unspecified 05/03/2014, 05/12/2015, 06/14/2017  . Pneumococcal Conjugate-13 07/21/2013  . Pneumococcal Polysaccharide-23 09/04/2007  . Td 09/04/2007, 03/12/2008  . Tdap 05/03/2014  . Zoster 01/27/2011   Screening Tests Health Maintenance  Topic Date Due  . COVID-19 Vaccine (1) Never done  . INFLUENZA VACCINE  03/21/2020  . TETANUS/TDAP  05/03/2024  . PNA vac Low Risk Adult  Completed        Plan:   ***  I have personally reviewed and noted the following in the patient's chart:    . Medical and social history . Use of alcohol, tobacco or illicit drugs  . Current medications and supplements . Functional ability and status . Nutritional status . Physical activity . Advanced directives . List of other physicians . Hospitalizations, surgeries, and ER visits in previous 12 months . Vitals . Screenings to include cognitive, depression, and falls . Referrals and appointments  In addition, I have reviewed and discussed with patient certain preventive protocols, quality metrics, and best practice recommendations. A written personalized care plan for preventive services as well as general preventive health recommendations were provided to patient.     Naaman Plummer [place], [place]  12/16/2019

## 2019-12-17 ENCOUNTER — Ambulatory Visit: Payer: Medicare Other | Admitting: *Deleted

## 2019-12-18 ENCOUNTER — Telehealth: Payer: Self-pay | Admitting: Cardiovascular Disease

## 2019-12-18 NOTE — Telephone Encounter (Signed)
Patient would like to speak to Dr. Blenda Mounts nurse.  Did not give any further detail.

## 2019-12-18 NOTE — Telephone Encounter (Signed)
Pt called to request to see if he is "safe" to have stomach fat freezing. He is wanting to get rid of some belly fat that he has developed.   Pt has not seen anyone about it but heard about it through social media but wanted to check on it   I advised him that Dr. Oval Linsey may not be familiar with the procedure since it is not "medical" but I will forward to her.  Pt is having a video visit with Jory Sims NP 12/22/19 and if he has any added information about it he can discuss with her at that visit.

## 2019-12-21 NOTE — Progress Notes (Signed)
Virtual Visit via Telephone Note   This visit type was conducted due to national recommendations for restrictions regarding the COVID-19 Pandemic (e.g. social distancing) in an effort to limit this patient's exposure and mitigate transmission in our community.  Due to his co-morbid illnesses, this patient is at least at moderate risk for complications without adequate follow up.  This format is felt to be most appropriate for this patient at this time.  The patient did not have access to video technology/had technical difficulties with video requiring transitioning to audio format only (telephone).  All issues noted in this document were discussed and addressed.  No physical exam could be performed with this format.  Please refer to the patient's chart for his  consent to telehealth for [facility].   Date:  12/22/2019   ID:  Brian [name], DOB 1932-12-30, MRN [id number]  Patient Location: Home Provider Location: Home  PCP:  Libby Maw, MD  Cardiologist:  Skeet Latch, MD  Electrophysiologist:  None   Evaluation Performed:  Follow-Up Visit  Chief Complaint:  Follow up  History of Present Illness:    Brian [name] is a 86 y.o. 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 Hx of prior GI bleed. Hx of MV repair in 2002 at [facility]. He is in a wheelchair with left hemiplegia.   He was last seen in the office by Kerin Ransom, PA on 11/03/2021No changes were made to his medication regimen.  He called our office on 12/18/2019 to ask it he could proceed with a cosmetic procedure to freeze his fat.   His main complaint today is knee pain and inability to walk. He was doing PT but stopped once it ran out. He denies chest pain, or DOE.   The patient does not have symptoms concerning for COVID-19 infection  (fever, chills, cough, or new shortness of breath).    Past Medical History:  Diagnosis Date  . Anemia   . Atrial flutter ([place])   . BPH (benign prostatic hypertrophy)   . Epistaxis   . Hemorrhage of gastrointestinal tract, unspecified   . History of open heart surgery   . Hypertension   . Osteoarthrosis, unspecified whether generalized or localized, unspecified site   . Personal history of venous thrombosis and embolism   . Rosacea   . Stroke ([place])   . TIA (transient ischemic attack)    Past Surgical History:  Procedure Laterality Date  . APPENDECTOMY    . HERNIA REPAIR    . IR GENERIC HISTORICAL  05/04/2016   IR PERCUTANEOUS ART THROMBECTOMY/INFUSION INTRACRANIAL INC DIAG ANGIO 05/04/2016 [name], MD [facility]  . IR GENERIC HISTORICAL  05/04/2016   IR ANGIO VERTEBRAL SEL SUBCLAVIAN INNOMINATE UNI R MOD SED 05/04/2016 [name], MD [facility]  . IR GENERIC HISTORICAL  06/07/2016   IR RADIOLOGIST EVAL & MGMT 06/07/2016 [facility]  . JOINT REPLACEMENT    . MITRAL VALVE REPAIR    . mvp repair    . RADIOLOGY WITH ANESTHESIA N/A 05/04/2016   Procedure: RADIOLOGY WITH ANESTHESIA;  Surgeon: [name], MD;  Location: [facility];  Service: Radiology;  Laterality: N/A;  . TOTAL HIP ARTHROPLASTY       Current Meds  Medication Sig  . ALPRAZolam (XANAX) 0.25 MG tablet TAKE 1 TABLET(0.25 MG) BY MOUTH AT BEDTIME  .  cetirizine (ZYRTEC) 10 MG tablet TAKE 1 TABLET(10 MG) BY MOUTH AT BEDTIME  . ELIQUIS 2.5 MG TABS tablet TAKE 1 TABLET(2.5 MG) BY MOUTH TWICE DAILY  . finasteride (PROSCAR) 5 MG tablet Take 1 tablet (5 mg total) by mouth daily.  . hydrocortisone (ANUCORT-HC) 25 MG suppository UNWRAP AND INSERT 1 SUPPOSITORY RECTALLY TWICE DAILY  . hydrocortisone (PROCTOZONE-HC) 2.5 % rectal cream USE RECTALLY TWICE DAILY  . hydrocortisone-pramoxine (ANALPRAM-HC) 2.5-1 % rectal cream Place 1 application rectally 2 (two) times daily as needed for hemorrhoids.   .  lidocaine (LIDODERM) 5 % APPLY 1 PATCH TOPICALLY ONCE DAILY.MAY WEAR UPTO 12 HOURS MAXIMUM IN A DAY.  . metoprolol succinate (TOPROL-XL) 25 MG 24 hr tablet Take 1 tablet (25 mg total) by mouth daily.  Marland Kitchen oxyCODONE-acetaminophen (PERCOCET/ROXICET) 5-325 MG tablet Take 1 tablet by mouth 2 (two) times daily.  . pantoprazole (PROTONIX) 40 MG tablet TAKE 1 TABLET(40 MG) BY MOUTH DAILY  . pravastatin (PRAVACHOL) 40 MG tablet TAKE 1 TABLET(40 MG) BY MOUTH DAILY  . sertraline (ZOLOFT) 50 MG tablet Take 1 tablet (50 mg total) by mouth every morning.  Cathie Olden (SILVASORB) GEL Apply daily to wound to keep moist.     Allergies:   Novocain [procaine hcl], Other, and Penicillins   Social History   Tobacco Use  . Smoking status: Former Research scientist (life sciences)  . Smokeless tobacco: Never Used  . Tobacco comment: quit 32 yr ago  Substance Use Topics  . Alcohol use: No  . Drug use: No     Family Hx: The patient's family history includes Coronary artery disease in his father; Rheumatic fever in his mother.  ROS:   Please see the history of present illness.    All other systems reviewed and are negative.   Prior CV studies:   The following studies were reviewed today:  Carotid Ultrasound 02/05/2018 Final Interpretation:  Right Carotid: Velocities in the right ICA are consistent with a 1-39%  stenosis.   Left Carotid: Velocities in the left ICA are consistent with a 1-39%  stenosis.   Vertebrals: Bilateral vertebral arteries demonstrate antegrade flow.   Echocardiogram 05/06/2016 Left ventricle: The cavity size was normal. Wall thickness was  normal. Systolic function was normal. The estimated ejection  fraction was in the range of 55% to 60%. Wall motion was normal;  there were no regional wall motion abnormalities. Mitral  annuloplasty precludes evaluation of LV diastolic function.  - Ventricular septum: Septal motion showed paradox. These changes  are consistent with a post-thoracotomy state.    - Mitral valve: Prior procedures included surgical repair. An  annular ring prosthesis was present and functioning normally.  Valve area by pressure half-time: 2.47 cm^2.  - Left atrium: The atrium was mildly dilated.  - Pulmonary arteries: Systolic pressure was mildly increased. PA  peak pressure: 44 mm Hg (S).   Labs/Other Tests and Data Reviewed:    EKG:  No ECG reviewed.  Recent Labs: 11/06/2019: BUN 15; Creatinine, Ser 0.49; Hemoglobin 15.1; Platelets 241.0; Potassium 4.4; Sodium 131   Recent Lipid Panel Lab Results  Component Value Date/Time   CHOL 134 04/16/2018 11:55 AM   TRIG 139 04/16/2018 11:55 AM   HDL 51 04/16/2018 11:55 AM   CHOLHDL 2.6 04/16/2018 11:55 AM   CHOLHDL 3.4 10/03/2016 11:57 AM   LDLCALC 55 04/16/2018 11:55 AM   LDLDIRECT 64.0 11/06/2019 02:23 PM    Wt Readings from Last 3 Encounters:  12/22/19 175 lb (79.4 kg)  07/08/19 160 lb (72.6  kg)  06/24/19 145 lb (65.8 kg)     Objective:    Vital Signs:  BP 129/68   Pulse (!) 58   Ht 5\' 6"  (1.676 m)   Wt 175 lb (79.4 kg)   BMI 28.25 kg/m    VITAL SIGNS:  reviewed GEN:  no acute distress NEURO:  alert and oriented x 3, no obvious focal deficit PSYCH:  Appears depressed.   ASSESSMENT & PLAN:    1. 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 Now on Eliquis. Denies any overt bleeding or excessive bruising.  Medically complaint. No changes at this time. Remains wheelchair dependent. Would like to have PT resumed. I have asked him to speak with PCP about reinstating this for him.  2.Atrail fib: He denies rapid HR or palpitations. Continues on metoprolol for rate control. This is keeping him WNL per vitals.   3. Hypertension: No changes in his regimen. He is well controlled on metoprolol.  4. Hyperlipidemia: On pravastatin. Labs are followed by PCP.  COVID-19 Education: The signs and symptoms of COVID-19 were discussed with the patient and how to seek care for testing (follow up with PCP or arrange  E-visit). The importance of social distancing was discussed today.He does not go out of his home.   Time:   Today, I have spent 67minutes with the patient with telehealth technology discussing the above problems, along with chart review and documentation..     Medication Adjustments/Labs and Tests Ordered: Current medicines are reviewed at length with the patient today.  Concerns regarding medicines are outlined above.   Tests Ordered: No orders of the defined types were placed in this encounter.   Medication Changes: No orders of the defined types were placed in this encounter.   Disposition:  Follow up 6 months   Signed, Phill Myron. West Pugh, ANP, AACC  12/22/2019 11:08 AM    [facility]

## 2019-12-21 NOTE — Telephone Encounter (Signed)
I'm not an expert in this area.  However, I don't think that it is a major safety concern.

## 2019-12-22 ENCOUNTER — Telehealth (INDEPENDENT_AMBULATORY_CARE_PROVIDER_SITE_OTHER): Payer: Medicare Other | Admitting: Adult Health

## 2019-12-22 ENCOUNTER — Telehealth: Payer: Self-pay | Admitting: Family Medicine

## 2019-12-22 ENCOUNTER — Encounter: Payer: Self-pay | Admitting: Adult Health

## 2019-12-22 VITALS — BP 129/68 | HR 58 | Ht 66.0 in | Wt 175.0 lb

## 2019-12-22 DIAGNOSIS — I63119 Cerebral infarction due to embolism of unspecified vertebral artery: Secondary | ICD-10-CM

## 2019-12-22 DIAGNOSIS — I10 Essential (primary) hypertension: Secondary | ICD-10-CM

## 2019-12-22 DIAGNOSIS — I4821 Permanent atrial fibrillation: Secondary | ICD-10-CM

## 2019-12-22 DIAGNOSIS — E7800 Pure hypercholesterolemia, unspecified: Secondary | ICD-10-CM

## 2019-12-22 DIAGNOSIS — I6300 Cerebral infarction due to thrombosis of unspecified precerebral artery: Secondary | ICD-10-CM

## 2019-12-22 NOTE — Telephone Encounter (Signed)
Advised patient of Dr Blenda Mounts recommendations.  Patient stated he has been having some swelling in his ankles that does go down at night  He is in a wheelchair, unable to elevate legs secondary to knee pain No change in diet, advised to watch salt intake and will forward to [place] to follow up with PCP/Orthopedist regarding knee pain

## 2019-12-22 NOTE — Telephone Encounter (Signed)
Patient is calling and wanted to speak to someone regarding getting a procedure done and also patient stated that he had swollen ankles. Pt declined an appointment at this time. CB is [phone]

## 2019-12-22 NOTE — Patient Instructions (Signed)
Medication Instructions:  °Continue current medications ° °*If you need a refill on your cardiac medications before your next appointment, please call your pharmacy* ° ° °Lab Work: °None Ordered ° ° °Testing/Procedures: °None Ordered ° ° °Follow-Up: °At [facility], you and your health needs are our priority.  As part of our continuing mission to provide you with exceptional heart care, we have created designated Provider Care Teams.  These Care Teams include your primary Cardiologist (physician) and Advanced Practice Providers (APPs -  Physician Assistants and Nurse Practitioners) who all work together to provide you with the care you need, when you need it. ° °We recommend signing up for the patient portal called "MyChart".  Sign up information is provided on this After Visit Summary.  MyChart is used to connect with patients for Virtual Visits (Telemedicine).  Patients are able to view lab/test results, encounter notes, upcoming appointments, etc.  Non-urgent messages can be sent to your provider as well.   °To learn more about what you can do with MyChart, go to [email or web address].   ° °Your next appointment:   °6 month(s) ° °The format for your next appointment:   °In Person ° °Provider:   °You may see Tiffany , MD or one of the following Advanced Practice Providers on your designated Care Team:   °· Luke Kilroy, PA-C °· Callie Goodrich, PA-C °· Jesse Cleaver, FNP ° ° ° ° °

## 2019-12-23 NOTE — Telephone Encounter (Signed)
Yes. I agree. I talked to him yesterday for a long time. Thank you.  KL

## 2019-12-24 ENCOUNTER — Telehealth: Payer: Self-pay | Admitting: Family Medicine

## 2019-12-24 ENCOUNTER — Other Ambulatory Visit: Payer: Self-pay | Admitting: Family Medicine

## 2019-12-24 DIAGNOSIS — F419 Anxiety disorder, unspecified: Secondary | ICD-10-CM

## 2019-12-24 NOTE — Telephone Encounter (Signed)
Patient is calling and requesting a order for over the commode chair. Pls advise. CB is [phone]

## 2019-12-29 NOTE — Telephone Encounter (Signed)
Okay for chair over commode? Please advise

## 2019-12-30 ENCOUNTER — Telehealth: Payer: Self-pay | Admitting: Family Medicine

## 2019-12-30 NOTE — Telephone Encounter (Signed)
Okay 

## 2019-12-30 NOTE — Telephone Encounter (Signed)
Patient is calling and wanted to see if Dr. Ethelene Hal could recommend him to get PT. Patient stated that he had a stroke a couple of years ago and is not able to move around as much. CB is [phone]. CB is [phone]

## 2020-01-01 ENCOUNTER — Other Ambulatory Visit: Payer: Self-pay | Admitting: Family Medicine

## 2020-01-01 DIAGNOSIS — K219 Gastro-esophageal reflux disease without esophagitis: Secondary | ICD-10-CM

## 2020-01-05 NOTE — Telephone Encounter (Signed)
Patient is calling back for update regarding previous message. CB is [phone]

## 2020-01-05 NOTE — Telephone Encounter (Signed)
Returned patients call spoke with his wife who informed me that patient was not in at the time of my call. Will call back.

## 2020-01-06 ENCOUNTER — Telehealth: Payer: Self-pay | Admitting: Family Medicine

## 2020-01-06 NOTE — Telephone Encounter (Signed)
Patient is requested referral for PT to [facility], [phone].

## 2020-01-12 DIAGNOSIS — R29810 Facial weakness: Secondary | ICD-10-CM | POA: Diagnosis not present

## 2020-01-12 DIAGNOSIS — H43813 Vitreous degeneration, bilateral: Secondary | ICD-10-CM | POA: Diagnosis not present

## 2020-01-12 DIAGNOSIS — H26492 Other secondary cataract, left eye: Secondary | ICD-10-CM | POA: Diagnosis not present

## 2020-01-12 DIAGNOSIS — Z961 Presence of intraocular lens: Secondary | ICD-10-CM | POA: Diagnosis not present

## 2020-01-13 DIAGNOSIS — M6281 Muscle weakness (generalized): Secondary | ICD-10-CM | POA: Diagnosis not present

## 2020-01-15 ENCOUNTER — Telehealth: Payer: Self-pay | Admitting: Family Medicine

## 2020-01-15 ENCOUNTER — Other Ambulatory Visit: Payer: Self-pay | Admitting: Gastroenterology

## 2020-01-15 DIAGNOSIS — K5901 Slow transit constipation: Secondary | ICD-10-CM

## 2020-01-15 NOTE — Telephone Encounter (Signed)
Patient is calling and stated that he is experiencing constipation and Hemorrhoids. Patient wanted to know if a suppository can be called in . CB is [phone]

## 2020-01-15 NOTE — Telephone Encounter (Signed)
WK-Plz see request below for PT for home health/thx dmf

## 2020-01-16 NOTE — Telephone Encounter (Signed)
Okay 

## 2020-01-20 MED ORDER — POLYETHYLENE GLYCOL 3350 17 GM/SCOOP PO POWD: ORAL | 1 refills | Status: AC

## 2020-01-20 NOTE — Telephone Encounter (Signed)
Please see message and advise.  Thank you. ° °

## 2020-01-22 NOTE — Telephone Encounter (Signed)
Pt informed med was sent.

## 2020-01-26 NOTE — Telephone Encounter (Signed)
Have tried reaching patient no answer LMTCB with wife

## 2020-01-29 ENCOUNTER — Telehealth: Payer: Self-pay | Admitting: Family Medicine

## 2020-01-29 NOTE — Progress Notes (Signed)
  Chronic Care Management   Note  01/29/2020 Name: Brian [name] MRN: [id number] DOB: 1933-01-13  Brian [name] is a 86 y.o. year old male who is a primary care patient of Libby Maw, MD. I reached out to [name] by phone today in response to a referral sent by Brian [name]'s PCP, Libby Maw, MD.   Brian [name] was given information about Chronic Care Management services today including:  1. CCM service includes personalized support from designated clinical staff supervised by his physician, including individualized plan of care and coordination with other care providers 2. 24/7 contact phone numbers for assistance for urgent and routine care needs. 3. Service will only be billed when office clinical staff spend 20 minutes or more in a month to coordinate care. 4. Only one practitioner may furnish and bill the service in a calendar month. 5. The patient may stop CCM services at any time (effective at the end of the month) by phone call to the office staff.   Patient agreed to services and verbal consent obtained.  This note is not being shared with the patient for the following reason: To respect privacy (The patient or proxy has requested that the information not be shared). Follow up plan:   Earney [place] [facility] Scheduler

## 2020-01-31 DIAGNOSIS — G894 Chronic pain syndrome: Secondary | ICD-10-CM | POA: Diagnosis not present

## 2020-01-31 DIAGNOSIS — Z7901 Long term (current) use of anticoagulants: Secondary | ICD-10-CM | POA: Diagnosis not present

## 2020-01-31 DIAGNOSIS — R238 Other skin changes: Secondary | ICD-10-CM | POA: Diagnosis not present

## 2020-01-31 DIAGNOSIS — Z7401 Bed confinement status: Secondary | ICD-10-CM | POA: Diagnosis not present

## 2020-01-31 DIAGNOSIS — L89159 Pressure ulcer of sacral region, unspecified stage: Secondary | ICD-10-CM | POA: Diagnosis not present

## 2020-02-02 ENCOUNTER — Telehealth: Payer: Self-pay | Admitting: Family Medicine

## 2020-02-02 NOTE — Telephone Encounter (Signed)
Error, disregard: Patient called called to make an appt.

## 2020-02-03 ENCOUNTER — Ambulatory Visit (INDEPENDENT_AMBULATORY_CARE_PROVIDER_SITE_OTHER): Payer: Medicare Other | Admitting: Gastroenterology

## 2020-02-03 ENCOUNTER — Encounter: Payer: Self-pay | Admitting: Gastroenterology

## 2020-02-03 VITALS — BP 138/76 | HR 76

## 2020-02-03 DIAGNOSIS — K5904 Chronic idiopathic constipation: Secondary | ICD-10-CM | POA: Diagnosis not present

## 2020-02-03 DIAGNOSIS — I6300 Cerebral infarction due to thrombosis of unspecified precerebral artery: Secondary | ICD-10-CM | POA: Diagnosis not present

## 2020-02-03 DIAGNOSIS — K625 Hemorrhage of anus and rectum: Secondary | ICD-10-CM

## 2020-02-03 MED ORDER — CLOTRIMAZOLE 1 % EX CREA: 1.0000 "application " | TOPICAL_CREAM | Freq: Two times a day (BID) | CUTANEOUS | 0 refills | Status: DC

## 2020-02-03 MED ORDER — HYDROCORTISONE ACETATE 25 MG RE SUPP: RECTAL | 10 refills | Status: DC

## 2020-02-03 NOTE — Patient Instructions (Signed)
We have sent the following medications to your pharmacy for you to pick up at your convenience: Lotrimin cream to apply to affected area and Anucort suppositories.   We have made a follow up appt for you to see Dr. Ethelene Hal on 02/09/20 at 4:00pm.   Thank you for choosing me and [facility].  Pricilla Riffle. Dagoberto Ligas., MD., Marval Regal

## 2020-02-03 NOTE — Progress Notes (Signed)
    History of Present Illness: This is a an 86 year old male with constipation and mild intermittent rectal bleeding.  He is accompanied by his son.  He was evaluated for the same in May 2019.  Anoscopy performed at that time showed 3 internal hemorrhoids and no other lesions.  Bleeding symptoms resolve quickly with the use of hydrocortisone suppositories for a few days.  He is maintained on Eliquis for history of atrial fibrillation and CVA.  His constipation is fairly well controlled on his current regimen.  He also complains of a rash on his left flank, a skin lesion on his left shin and he is concerned he may have pressure sores on his buttocks.    Current Medications, Allergies, Past Medical History, Past Surgical History, Family History and Social History were reviewed in [employer] record.   Physical Exam: General: Well developed, well nourished, elderly in wheel chair no acute distress Head: Normocephalic and atraumatic Eyes:  sclerae anicteric, EOMI Ears: Normal auditory acuity Mouth: Not examined, mask on during Covid-19 pandemic Lungs: Clear throughout to auscultation Heart: Regular rate and rhythm; no murmurs, rubs or bruits Abdomen: Soft, non tender and non distended. No masses, hepatosplenomegaly or hernias noted. Normal Bowel sounds.  Mid right-sided erythema and superficial fissures in a skin fold Rectal: Unable to safely lift him from his wheelchair to a exam table. Anoscopy in 2019 as above Musculoskeletal: Symmetrical with no gross deformities  Pulses:  Normal pulses noted Extremities: No clubbing or deformities noted.  Bilateral pretibial edema.  Left shin with a bandage which was temporarily removed revealing 2 cm superficial ulceration Neurological: Alert oriented x 4, grossly nonfocal Psychological:  Alert and cooperative. Normal mood and affect   Assessment and Recommendations:  1.  Constipation and intermittent small-volume rectal bleeding  felt secondary to internal hemorrhoids.  High-fiber diet along with adequate daily hydration long-term.  MiraLAX qd long term.  MOM qd as needed for constipation not fully responsive to MiraLAX.  HC suppositories PR daily as needed.  Standard rectal care instructions.  We discussed potential further evaluation with flexible sigmoidoscopy or colonoscopy however he is high risk for procedures and would like to avoid procedures / sedation which prudent and reasonable.  2.  Atrial fibrillation, history of CVA, chronic anticoagulation with Eliquis.  3.  History of bleeding duodenal ulcer in 2002.  Continue pantoprazole 40 mg daily long-term.  4.  Suspected Candida dermatitis on right abdomen.  Lotrimin AF topically twice daily for 1 week.  If not resolved follow-up with Dr. Ethelene Hal.  5. LLE skin ulcer and reported pressure sores on buttocks. Follow up with Dr. Ethelene Hal.

## 2020-02-05 ENCOUNTER — Other Ambulatory Visit: Payer: Self-pay

## 2020-02-05 ENCOUNTER — Other Ambulatory Visit: Payer: Self-pay | Admitting: Family Medicine

## 2020-02-05 DIAGNOSIS — F419 Anxiety disorder, unspecified: Secondary | ICD-10-CM

## 2020-02-06 ENCOUNTER — Ambulatory Visit (INDEPENDENT_AMBULATORY_CARE_PROVIDER_SITE_OTHER): Payer: Medicare Other | Admitting: Family Medicine

## 2020-02-06 ENCOUNTER — Encounter: Payer: Self-pay | Admitting: Family Medicine

## 2020-02-06 VITALS — BP 124/68 | HR 88 | Temp 98.2°F | Ht 66.0 in | Wt 185.0 lb

## 2020-02-06 DIAGNOSIS — E7800 Pure hypercholesterolemia, unspecified: Secondary | ICD-10-CM

## 2020-02-06 DIAGNOSIS — R3911 Hesitancy of micturition: Secondary | ICD-10-CM

## 2020-02-06 DIAGNOSIS — I83028 Varicose veins of left lower extremity with ulcer other part of lower leg: Secondary | ICD-10-CM | POA: Diagnosis not present

## 2020-02-06 DIAGNOSIS — N401 Enlarged prostate with lower urinary tract symptoms: Secondary | ICD-10-CM

## 2020-02-06 DIAGNOSIS — M623 Immobility syndrome (paraplegic): Secondary | ICD-10-CM

## 2020-02-06 DIAGNOSIS — M4120 Other idiopathic scoliosis, site unspecified: Secondary | ICD-10-CM

## 2020-02-06 DIAGNOSIS — I10 Essential (primary) hypertension: Secondary | ICD-10-CM

## 2020-02-06 DIAGNOSIS — M169 Osteoarthritis of hip, unspecified: Secondary | ICD-10-CM

## 2020-02-06 DIAGNOSIS — M5416 Radiculopathy, lumbar region: Secondary | ICD-10-CM

## 2020-02-06 DIAGNOSIS — M7989 Other specified soft tissue disorders: Secondary | ICD-10-CM | POA: Diagnosis not present

## 2020-02-06 DIAGNOSIS — R269 Unspecified abnormalities of gait and mobility: Secondary | ICD-10-CM

## 2020-02-06 DIAGNOSIS — M8949 Other hypertrophic osteoarthropathy, multiple sites: Secondary | ICD-10-CM

## 2020-02-06 DIAGNOSIS — L97901 Non-pressure chronic ulcer of unspecified part of unspecified lower leg limited to breakdown of skin: Secondary | ICD-10-CM | POA: Insufficient documentation

## 2020-02-06 DIAGNOSIS — I83009 Varicose veins of unspecified lower extremity with ulcer of unspecified site: Secondary | ICD-10-CM | POA: Insufficient documentation

## 2020-02-06 DIAGNOSIS — L97821 Non-pressure chronic ulcer of other part of left lower leg limited to breakdown of skin: Secondary | ICD-10-CM

## 2020-02-06 DIAGNOSIS — M159 Polyosteoarthritis, unspecified: Secondary | ICD-10-CM

## 2020-02-06 NOTE — Patient Instructions (Addendum)
Venous Ulcer A venous ulcer is a shallow sore on your lower leg that is caused by poor circulation in your veins. This condition used to be called stasis ulcer. Venous ulcer is the most common type of lower leg ulcer. You may have venous ulcers on one leg or on both legs. The area where this condition most commonly develops is around the ankles. A venous ulcer may last for a long time (chronic ulcer) or it may return repeatedly (recurrent ulcer). What are the causes? A venous ulcer may be caused by any condition that causes poor blood flow in your legs. Veins have valves that help return blood to the heart. If these valves do not work properly:  Blood can flow backward and pool in the lower legs.  Blood can then leak out of your veins, which can irritate your skin.  Irritation can cause a break in the skin, which becomes a venous ulcer. What increases the risk? You are more likely to develop this condition if you:  Are 6 years of age or older.  Are male.  Are overweight.  Are not active.  Have had a leg ulcer in the past.  Have varicose veins.  Have clots in your lower leg veins (deep vein thrombosis).  Have inflammation of your leg veins (phlebitis).  Have recently had a pregnancy.  Use products that contain nicotine or tobacco. What are the signs or symptoms? The main symptom of this condition is an open sore near your ankle. Other symptoms may include:  Swelling.  Thickening of the skin.  Fluid leaking from the ulcer.  Bleeding.  Itching.  Pain and swelling that gets worse when you stand up and feels better when you raise your leg.  Blotchy skin.  Darkening of the skin. How is this diagnosed? Your health care provider may suspect a venous ulcer based on your medical history and your risk factors. He or she may:  Do a physical exam.  Do other tests, such as: ? Measuring blood pressure in your arms and legs. ? Using sound waves (ultrasound) to measure  blood flow in your leg veins. How is this treated? This condition may be treated by:  Keeping your leg raised (elevated).  Wearing a type of bandage or stocking to compress the veins of your leg (compression therapy).  Taking medicines to improve blood flow.  Taking antibiotic medicines to treat infection.  Cleaning your ulcer and removing any dead tissue from the wound (debridement).  Placing various types of medicated bandages (dressings) or medicated wraps on your ulcer.  Surgery to close the wound using a piece of skin taken from another area of your body (graft). This is only done for wounds that are deep or hard to heal. You may need to try several different types of treatment to get your venous ulcer to heal. Healing may take a long time. Follow these instructions at home: Medicines  Take or apply over-the-counter and prescription medicines only as told by your health care provider.  If you were prescribed an antibiotic medicine, take it as told by your health care provider. Do not stop using the antibiotic even if you start to feel better.  Ask your health care provider if you should take aspirin before long trips. Wound care  Follow instructions from your health care provider about how to take care of your wound. Make sure you: ? Wash your hands with soap and water before and after you change your bandage (dressing). If soap and  water are not available, use hand sanitizer. ? Change your dressing as told by your health care provider. ? If you had a skin graft, leave stitches (sutures) in place. These may need to stay in place for 2 weeks or longer. ? Ask when you should remove your dressing. If your dressing is dry and sticks to your leg when you try to remove it, moisten or wet the dressing with saline solution or water so that the dressing can be removed without harming your skin or wound tissue.  When you are able to remove your dressing, check your wound every day for  signs of infection. Have a caregiver do this for you if you are not able to do it yourself. Check for: ? More redness, swelling, or pain. ? More fluid or blood. ? Warmth. ? Pus or a bad smell. Activity  Avoid sitting for a long time without moving. Get up to take short walks every 1-2 hours. This is important to improve blood flow in your legs. Ask for help if you feel weak or unsteady.  Ask your health care provider what level of activity is safe for you.  Rest with your legs raised (elevated) during the day. If possible, elevate your legs above the level of your heart for 30 minutes, 3-4 times a day, or as told by your health care provider.  Do not sit with your legs crossed. General instructions   Wear elastic stockings, compression stockings, or support hose as told by your health care provider.  Raise the foot of your bed as told by your health care provider.  Do not use any products that contain nicotine or tobacco, such as cigarettes, e-cigarettes, and chewing tobacco. If you need help quitting, ask your health care provider.  Keep all follow-up visits as told by your health care provider. This is important. Contact a health care provider if:  Your ulcer is getting larger or is not healing.  Your pain gets worse. Get help right away if you have:  More redness, swelling, or pain around your ulcer.  More fluid or blood coming from your ulcer.  Warmth in the area around your ulcer.  Pus or a bad smell coming from your ulcer.  A fever. Summary  A venous ulcer is a shallow sore on your lower leg that is caused by poor circulation in your veins.  Follow instructions from your health care provider about how to take care of your wound.  Check your wound every day for signs of infection.  Take over-the-counter and prescription medicines only as told by your health care provider.  Keep all follow-up visits as told by your health care provider. This is important. This  information is not intended to replace advice given to you by your health care provider. Make sure you discuss any questions you have with your health care provider. Document Revised: 04/04/2018 Document Reviewed: 04/04/2018 Elsevier Patient Education  [place].  Preventing Pressure Injuries  A pressure injury, sometimes called a bedsore or a pressure ulcer, is an injury to the skin and underlying tissue caused by pressure. A pressure injury can happen when your skin presses against a surface, such as a mattress or wheelchair seat, for too long. The pressure on the blood vessels causes reduced blood flow to your skin. This can eventually cause the skin tissue to die and break down into a wound. Pressure injuries usually develop:  Over bony parts of the body, such as the tailbone, shoulders, elbows,  hips, and heels.  Under medical devices, such as respiratory equipment, stockings, tubes, and splints. How can this condition affect me? Pressure injuries are caused by a lack of blood supply to an area of skin. These injuries begin as a reddened area on the skin and can become an open sore. They can result from intense pressure over a short period of time or from less pressure over a long period of time. Pressure injuries can vary in severity. They can cause pain, muscle damage, and infection. What can increase my risk? This condition is more likely to develop in people who:  Are in the hospital or an extended care facility.  Are bedridden or in a wheelchair.  Have an injury or disease that keeps them from: ? Moving normally. ? Feeling pain or pressure. ? Communicating if they feel pain or pressure.  Have a condition that: ? Makes them sleepy or less alert. ? Causes poor blood flow.  Need to wear a medical device.  Have poor control of their bladder or bowel functions (incontinence).  Have poor nutrition (malnutrition).  Have had this condition before.  Are of certain  ethnicities. People of African American, Latino, or Hispanic descent are at higher risk compared to other ethnic groups. What actions can I take to prevent pressure injuries? Reducing and redistributing pressure  Do not lie or sit in one position for a long time. Move or change position: ? Every hour when out of bed in a chair. ? Every two hours when in bed. ? As often as told by your health care provider.  Use pillows, wedges, or cushions to redistribute pressure. Ask your health care provider to recommend a mattress, cushions, or pads for you.  Use medical devices that do not rub your skin. Tell your health care provider if one of your medical devices is causing pain or irritation. Skin care If you are in the hospital, your health care providers:  Will inspect your skin, including areas under or around medical devices, at least twice a day.  May recommend that you use certain types of bedding to help prevent pressure injuries. These may include a pad, mattress, or chair cushion that is filled with gel, air, water, or foam.  Will evaluate your nutrition and consult a dietitian if needed.  Will inspect and change any wound dressings regularly.  May help you move into different positions every few hours.  Will adjust any medical devices and braces as needed to limit pressure on your skin.  Will keep your skin clean and dry.  May use gentle cleansers and skin protectants if you are incontinent.  Will moisturize any dry skin. In general, at home:  Keep your skin clean and dry. Gently pat your skin dry.  Do not rub or massage bony areas of your skin.  Moisturize dry skin.  Use gentle cleansers and skin protectants routinely if you are incontinent.  Check your skin at least once a day for any changes in color and for any new blisters or sores. Make sure to check under and around any medical devices and between skin folds. Have a caregiver do this for you if you are not  able.  Lifestyle  Be as active as you can every day. Ask your health care provider to suggest safe exercises or activities.  Do not abuse drugs or alcohol.  Do not use any products that contain nicotine or tobacco, such as cigarettes, e-cigarettes, and chewing tobacco. If you need help quitting, ask  your health care provider. General instructions   Take over-the-counter and prescription medicines only as told by your health care provider.  Work with your health care provider to manage any chronic health conditions.  Eat a healthy diet that includes protein, vitamins, and minerals. Ask your health care provider what types of food you should eat.  Drink enough fluid to keep your urine pale yellow.  Keep all follow-up visits as told by your health care provider. This is important. Contact a health care provider if you:  Feel or see any changes in your skin. Summary  A pressure injury, sometimes called a bedsore or a pressure ulcer, is an injury to the skin and underlying tissue caused by pressure.  Do not lie or sit in one position for a long time.  Check your skin at least once a day for any changes in color and for any new blisters or sores.  Make sure to check under and around any medical devices and between skin folds. Have a caregiver do this for you if you are not able.  Eat a healthy diet that includes protein, vitamins, and minerals. Ask your health care provider what types of food you should eat. This information is not intended to replace advice given to you by your health care provider. Make sure you discuss any questions you have with your health care provider. Document Revised: 11/29/2018 Document Reviewed: 04/30/2018 Elsevier Patient Education  [place].

## 2020-02-06 NOTE — Progress Notes (Signed)
Established Patient Office Visit  Subjective:  Patient ID: Brian [name], male    DOB: [birth year]  Age: 86 y.o. MRN: [id number]  CC:  Chief Complaint  Patient presents with  . Edema    swollen ankles, sore on left leg x 2 weeks, spots on stomach x 1-2 weeks.     HPI Brian [name] presents for follow-up of hypertension, BPH, rash on his lower abdomen and a sore on his left shin.  Blood pressure has been controlled with his current regimen.  Urine flow is mostly okay there is some hesitancy.  Over the last several days he has developed a sore on his left shin.  His legs remain swollen.  He is nonambulatory and his legs remain in a dependent position throughout the day.  Patient feels as though the Percocets are definitely needed for pain management.  Past Medical History:  Diagnosis Date  . Anemia   . Atrial flutter ([place])   . BPH (benign prostatic hypertrophy)   . Epistaxis   . Hemorrhage of gastrointestinal tract, unspecified   . History of open heart surgery   . Hypertension   . Osteoarthrosis, unspecified whether generalized or localized, unspecified site   . Personal history of venous thrombosis and embolism   . Rosacea   . Stroke ([place])   . TIA (transient ischemic attack)     Past Surgical History:  Procedure Laterality Date  . APPENDECTOMY    . HERNIA REPAIR    . IR GENERIC HISTORICAL  05/04/2016   IR PERCUTANEOUS ART THROMBECTOMY/INFUSION INTRACRANIAL INC DIAG ANGIO 05/04/2016 [name], MD [facility]  . IR GENERIC HISTORICAL  05/04/2016   IR ANGIO VERTEBRAL SEL SUBCLAVIAN INNOMINATE UNI R MOD SED 05/04/2016 [name], MD [facility]  . IR GENERIC HISTORICAL  06/07/2016   IR RADIOLOGIST EVAL & MGMT 06/07/2016 [facility]  . JOINT REPLACEMENT    . MITRAL VALVE REPAIR    . mvp repair    . RADIOLOGY WITH ANESTHESIA N/A 05/04/2016   Procedure: RADIOLOGY WITH ANESTHESIA;  Surgeon: [name], MD;  Location: [facility];  Service: Radiology;   Laterality: N/A;  . TOTAL HIP ARTHROPLASTY      Family History  Problem Relation Age of Onset  . Rheumatic fever Mother   . Coronary artery disease Father     Social History   Socioeconomic History  . Marital status: Married    Spouse name: Not on file  . Number of children: Not on file  . Years of education: Not on file  . Highest education level: Not on file  Occupational History  . Not on file  Tobacco Use  . Smoking status: Former Research scientist (life sciences)  . Smokeless tobacco: Never Used  . Tobacco comment: quit 50 yr ago  Vaping Use  . Vaping Use: Never used  Substance and Sexual Activity  . Alcohol use: No  . Drug use: No  . Sexual activity: Not on file  Other Topics Concern  . Not on file  Social History Narrative  . Not on file   Social Determinants of Health   Financial Resource Strain:   . Difficulty of Paying Living Expenses:   Food Insecurity:   . Worried About Charity fundraiser in the Last Year:   . Arboriculturist in the Last Year:   Transportation Needs:   . Film/video editor (Medical):   Marland Kitchen Lack of Transportation (Non-Medical):   Physical Activity:   . Days of Exercise  per Week:   . Minutes of Exercise per Session:   Stress:   . Feeling of Stress :   Social Connections:   . Frequency of Communication with Friends and Family:   . Frequency of Social Gatherings with Friends and Family:   . Attends Religious Services:   . Active Member of Clubs or Organizations:   . Attends Archivist Meetings:   Marland Kitchen Marital Status:   Intimate Partner Violence:   . Fear of Current or Ex-Partner:   . Emotionally Abused:   Marland Kitchen Physically Abused:   . Sexually Abused:     Outpatient Medications Prior to Visit  Medication Sig Dispense Refill  . ALPRAZolam (XANAX) 0.25 MG tablet TAKE 1 TABLET(0.25 MG) BY MOUTH AT BEDTIME 90 tablet 1  . cetirizine (ZYRTEC) 10 MG tablet TAKE 1 TABLET(10 MG) BY MOUTH AT BEDTIME 90 tablet 2  . clotrimazole (LOTRIMIN AF) 1 % cream Apply  1 application topically 2 (two) times daily. 30 g 0  . ELIQUIS 2.5 MG TABS tablet TAKE 1 TABLET(2.5 MG) BY MOUTH TWICE DAILY 180 tablet 1  . finasteride (PROSCAR) 5 MG tablet Take 1 tablet (5 mg total) by mouth daily. 90 tablet 3  . hydrocortisone (ANUCORT-HC) 25 MG suppository UNWRAP AND INSERT 1 SUPPOSITORY RECTALLY TWICE DAILY 30 suppository 10  . hydrocortisone (PROCTOZONE-HC) 2.5 % rectal cream USE RECTALLY TWICE DAILY 30 g 0  . hydrocortisone-pramoxine (ANALPRAM-HC) 2.5-1 % rectal cream Place 1 application rectally 2 (two) times daily as needed for hemorrhoids.     . metoprolol succinate (TOPROL-XL) 25 MG 24 hr tablet Take 1 tablet (25 mg total) by mouth daily. 90 tablet 3  . oxyCODONE-acetaminophen (PERCOCET/ROXICET) 5-325 MG tablet Take 1 tablet by mouth 2 (two) times daily. 180 tablet 0  . pantoprazole (PROTONIX) 40 MG tablet TAKE 1 TABLET(40 MG) BY MOUTH DAILY 90 tablet 0  . pravastatin (PRAVACHOL) 40 MG tablet TAKE 1 TABLET(40 MG) BY MOUTH DAILY 90 tablet 3  . sertraline (ZOLOFT) 50 MG tablet TAKE 1 TABLET(50 MG) BY MOUTH EVERY MORNING 90 tablet 3  . lidocaine (LIDODERM) 5 % APPLY 1 PATCH TOPICALLY ONCE DAILY.MAY WEAR UPTO 12 HOURS MAXIMUM IN A DAY. (Patient not taking: Reported on 02/06/2020)    . polyethylene glycol powder (GLYCOLAX/MIRALAX) 17 GM/SCOOP powder Mix one scoop with water and take daily until stools loosen. May repeat as needed. (Patient not taking: Reported on 02/06/2020) 850 g 1  . Silver (SILVASORB) GEL Apply daily to wound to keep moist. (Patient not taking: Reported on 02/06/2020) 44.4 mL 0   No facility-administered medications prior to visit.    Allergies  Allergen Reactions  . Novocain [Procaine Hcl]     Irregular heart beat     . Other     Patient had problem with epidural with his right hip surgery. Went to up extremity instead of lower extremity   . Penicillins Itching    Has patient had a PCN reaction causing immediate rash, facial/tongue/throat swelling,  SOB or lightheadedness with hypotension: Unk Has patient had a PCN reaction causing severe rash involving mucus membranes or skin necrosis: Unk Has patient had a PCN reaction that required hospitalization: Unk Has patient had a PCN reaction occurring within the last 10 years: No If all of the above answers are "NO", then may proceed with Cephalosporin use.  No reaction noted    ROS Review of Systems  Constitutional: Negative.  Negative for activity change.  Respiratory: Negative.   Cardiovascular: Negative.  Gastrointestinal: Negative.   Musculoskeletal: Positive for arthralgias.  Skin: Positive for wound.  Neurological: Positive for weakness.  Psychiatric/Behavioral: Positive for confusion. Negative for behavioral problems, decreased concentration and dysphoric mood.      Objective:    Physical Exam Vitals and nursing note reviewed.  Constitutional:      General: He is not in acute distress.    Appearance: Normal appearance. He is ill-appearing. He is not toxic-appearing or diaphoretic.  HENT:     Head: Normocephalic and atraumatic.     Right Ear: External ear normal.     Left Ear: External ear normal.  Eyes:     General: No scleral icterus.       Right eye: No discharge.        Left eye: No discharge.     Conjunctiva/sclera: Conjunctivae normal.  Cardiovascular:     Rate and Rhythm: Normal rate and regular rhythm.  Pulmonary:     Effort: Pulmonary effort is normal.     Breath sounds: Normal breath sounds.  Musculoskeletal:     Cervical back: Rigidity present.  Skin:    General: Skin is warm and dry.       Neurological:     Mental Status: He is alert and oriented to person, place, and time.  Psychiatric:        Mood and Affect: Mood normal.        Behavior: Behavior normal.     There were no vitals taken for this visit. Wt Readings from Last 3 Encounters:  12/22/19 175 lb (79.4 kg)  07/08/19 160 lb (72.6 kg)  06/24/19 145 lb (65.8 kg)     Health  Maintenance Due  Topic Date Due  . COVID-19 Vaccine (1) Never done    There are no preventive care reminders to display for this patient.  Lab Results  Component Value Date   TSH 2.99 09/26/2016   Lab Results  Component Value Date   WBC 8.4 11/06/2019   HGB 15.1 11/06/2019   HCT 44.9 11/06/2019   MCV 95.8 11/06/2019   PLT 241.0 11/06/2019   Lab Results  Component Value Date   NA 131 (L) 11/06/2019   K 4.4 11/06/2019   CO2 28 11/06/2019   GLUCOSE 91 11/06/2019   BUN 15 11/06/2019   CREATININE 0.49 11/06/2019   BILITOT 0.8 05/12/2018   ALKPHOS 73 05/12/2018   AST 25 05/12/2018   ALT 24 05/12/2018   PROT 6.4 (L) 05/12/2018   ALBUMIN 3.5 05/12/2018   CALCIUM 9.1 11/06/2019   ANIONGAP 13 11/03/2018   GFR 161.83 11/06/2019   Lab Results  Component Value Date   CHOL 134 04/16/2018   Lab Results  Component Value Date   HDL 51 04/16/2018   Lab Results  Component Value Date   LDLCALC 55 04/16/2018   Lab Results  Component Value Date   TRIG 139 04/16/2018   Lab Results  Component Value Date   CHOLHDL 2.6 04/16/2018   Lab Results  Component Value Date   HGBA1C 4.9 04/08/2019      Assessment & Plan:   Problem List Items Addressed This Visit      Cardiovascular and Mediastinum   Essential hypertension - Primary   Relevant Orders   Basic metabolic panel     Other   Elevated cholesterol   Relevant Orders   LDL cholesterol, direct      No orders of the defined types were placed in this encounter.   Follow-up: No follow-ups  on file.  Wound care nursing to help with stasis ulcer on his left shin, ankles and the pressure sores on his buttocks.  As per help and ID is for relieving the swelling in his lower extremities.  Asked his son to see if the legs could be elevated at night at night while sleeping.  Will use the Lotrimin cream for the rash on his left lower abdomen.  We will continue the Proscar as it is probably helping with his BPH symptoms.  Do  not feel as though PSA monitoring is necessary at this point.  Libby Maw, MD

## 2020-02-07 LAB — BASIC METABOLIC PANEL
BUN/Creatinine Ratio: 19 (calc) (ref 6–22)
BUN: 12 mg/dL (ref 7–25)
CO2: 28 mmol/L (ref 20–32)
Calcium: 8.9 mg/dL (ref 8.6–10.3)
Chloride: 100 mmol/L (ref 98–110)
Creat: 0.63 mg/dL — ABNORMAL LOW (ref 0.70–1.11)
Glucose, Bld: 99 mg/dL (ref 65–99)
Potassium: 4.6 mmol/L (ref 3.5–5.3)
Sodium: 136 mmol/L (ref 135–146)

## 2020-02-07 LAB — LDL CHOLESTEROL, DIRECT: Direct LDL: 68 mg/dL (ref <100)

## 2020-02-09 ENCOUNTER — Ambulatory Visit: Payer: Medicare Other | Admitting: Family Medicine

## 2020-02-09 ENCOUNTER — Telehealth: Payer: Self-pay | Admitting: Family Medicine

## 2020-02-09 NOTE — Telephone Encounter (Signed)
Patient would like a call about his recent lab results. Please call [phone] if before 3pm or call [phone] after 3pm.

## 2020-02-10 ENCOUNTER — Telehealth: Payer: Self-pay | Admitting: Family Medicine

## 2020-02-10 NOTE — Telephone Encounter (Signed)
Dr. Ethelene Hal message below is FYI regarding patients PT

## 2020-02-10 NOTE — Telephone Encounter (Signed)
Okay 

## 2020-02-10 NOTE — Telephone Encounter (Signed)
Brian [name] at [facility] called to let us know that per Mr. Seal request, they will start his therapy on Thursday, 02/12/20 which is outside of the recommended 48 hours.

## 2020-02-12 DIAGNOSIS — I872 Venous insufficiency (chronic) (peripheral): Secondary | ICD-10-CM | POA: Diagnosis not present

## 2020-02-12 DIAGNOSIS — Z86718 Personal history of other venous thrombosis and embolism: Secondary | ICD-10-CM | POA: Diagnosis not present

## 2020-02-12 DIAGNOSIS — M1990 Unspecified osteoarthritis, unspecified site: Secondary | ICD-10-CM | POA: Diagnosis not present

## 2020-02-12 DIAGNOSIS — I10 Essential (primary) hypertension: Secondary | ICD-10-CM | POA: Diagnosis not present

## 2020-02-12 DIAGNOSIS — I4819 Other persistent atrial fibrillation: Secondary | ICD-10-CM | POA: Diagnosis not present

## 2020-02-12 DIAGNOSIS — L89321 Pressure ulcer of left buttock, stage 1: Secondary | ICD-10-CM | POA: Diagnosis not present

## 2020-02-12 DIAGNOSIS — L89311 Pressure ulcer of right buttock, stage 1: Secondary | ICD-10-CM | POA: Diagnosis not present

## 2020-02-12 DIAGNOSIS — Z87891 Personal history of nicotine dependence: Secondary | ICD-10-CM | POA: Diagnosis not present

## 2020-02-12 DIAGNOSIS — M5416 Radiculopathy, lumbar region: Secondary | ICD-10-CM | POA: Diagnosis not present

## 2020-02-12 DIAGNOSIS — Z993 Dependence on wheelchair: Secondary | ICD-10-CM | POA: Diagnosis not present

## 2020-02-12 DIAGNOSIS — N400 Enlarged prostate without lower urinary tract symptoms: Secondary | ICD-10-CM | POA: Diagnosis not present

## 2020-02-12 DIAGNOSIS — Z466 Encounter for fitting and adjustment of urinary device: Secondary | ICD-10-CM | POA: Diagnosis not present

## 2020-02-12 DIAGNOSIS — Z7901 Long term (current) use of anticoagulants: Secondary | ICD-10-CM | POA: Diagnosis not present

## 2020-02-12 DIAGNOSIS — I2510 Atherosclerotic heart disease of native coronary artery without angina pectoris: Secondary | ICD-10-CM | POA: Diagnosis not present

## 2020-02-12 DIAGNOSIS — Z8673 Personal history of transient ischemic attack (TIA), and cerebral infarction without residual deficits: Secondary | ICD-10-CM | POA: Diagnosis not present

## 2020-02-12 NOTE — Telephone Encounter (Signed)
Spoke with patient ho called to go over labs but ended up reviewing labs online and no longer have questions.

## 2020-02-20 DIAGNOSIS — L89321 Pressure ulcer of left buttock, stage 1: Secondary | ICD-10-CM | POA: Diagnosis not present

## 2020-02-20 DIAGNOSIS — I2510 Atherosclerotic heart disease of native coronary artery without angina pectoris: Secondary | ICD-10-CM | POA: Diagnosis not present

## 2020-02-20 DIAGNOSIS — L89311 Pressure ulcer of right buttock, stage 1: Secondary | ICD-10-CM | POA: Diagnosis not present

## 2020-02-20 DIAGNOSIS — I10 Essential (primary) hypertension: Secondary | ICD-10-CM | POA: Diagnosis not present

## 2020-02-20 DIAGNOSIS — I872 Venous insufficiency (chronic) (peripheral): Secondary | ICD-10-CM | POA: Diagnosis not present

## 2020-02-20 DIAGNOSIS — N400 Enlarged prostate without lower urinary tract symptoms: Secondary | ICD-10-CM | POA: Diagnosis not present

## 2020-02-20 NOTE — Addendum Note (Signed)
Addended by: Lynda Rainwater on: 02/20/2020 04:19 PM   Modules accepted: Orders

## 2020-02-23 ENCOUNTER — Other Ambulatory Visit: Payer: Self-pay | Admitting: Family Medicine

## 2020-02-23 DIAGNOSIS — I6300 Cerebral infarction due to thrombosis of unspecified precerebral artery: Secondary | ICD-10-CM

## 2020-02-23 DIAGNOSIS — I480 Paroxysmal atrial fibrillation: Secondary | ICD-10-CM

## 2020-02-24 NOTE — Chronic Care Management (AMB) (Deleted)
Chronic Care Management Pharmacy  Name: HELTON OLESON  MRN: [id number] DOB: [birth year]   Chief Complaint/ HPI  Brian [name],  86 y.o. , male presents for their Initial CCM visit with the clinical pharmacist via telephone due to COVID-19 Pandemic.  PCP : Libby Maw, MD Patient Care Team: Libby Maw, MD as PCP - General (Family Medicine) Skeet Latch, MD as PCP - Cardiology (Cardiology) Germaine Pomfret, [facility] as Pharmacist (Pharmacist)  Their chronic conditions include: Hypertension, Hyperlipidemia, Atrial Fibrillation, Coronary Artery Disease, Anxiety, Osteoarthritis, BPH and peptic ulcer disease   Office Visits: 02/06/20: Patient presented to Dr. Ethelene Hal for follow-up. Patient with swollen legs and sore on left legs, otherwise stable. Patient instructed to elevate legs at night and to use clotrimazole on abdomen rash. Referral placed to home health.  11/06/19: Patient presented to Dr. Ethelene Hal for follow-up. Lab work stable, no medication changes made.   Consult Visit: 02/03/20: patient presented to Dr. Fuller Plan (GI) for constipation and mild rectal bleeding. Patient with suspeted candidate dermatitis on abdomne, started on clotrimazole. Patient instructed to continue using hydrocortisone suppositories for rectal bleeding.  12/22/19: Patient presented to Leonia Reader, NP (cardiology) for HTN follow-up.  11/25/19: Patient presented to [place], AuD CCC-A (audiology) for bilateral hearing loss. Patient wears hearing aids.     Patient started home health 02/12/20 wheelchair with left hemiplegia.  Son attends appointments.   Allergies  Allergen Reactions  . Novocain [Procaine Hcl]     Irregular heart beat     . Other     Patient had problem with epidural with his right hip surgery. Went to up extremity instead of lower extremity   . Penicillins Itching    Has patient had a PCN reaction causing immediate rash, facial/tongue/throat swelling, SOB or  lightheadedness with hypotension: Unk Has patient had a PCN reaction causing severe rash involving mucus membranes or skin necrosis: Unk Has patient had a PCN reaction that required hospitalization: Unk Has patient had a PCN reaction occurring within the last 10 years: No If all of the above answers are "NO", then may proceed with Cephalosporin use.  No reaction noted    Medications: Outpatient Encounter Medications as of 02/25/2020  Medication Sig  . ALPRAZolam (XANAX) 0.25 MG tablet TAKE 1 TABLET(0.25 MG) BY MOUTH AT BEDTIME  . cetirizine (ZYRTEC) 10 MG tablet TAKE 1 TABLET(10 MG) BY MOUTH AT BEDTIME  . clotrimazole (LOTRIMIN AF) 1 % cream Apply 1 application topically 2 (two) times daily.  Marland Kitchen ELIQUIS 2.5 MG TABS tablet TAKE 1 TABLET(2.5 MG) BY MOUTH TWICE DAILY  . finasteride (PROSCAR) 5 MG tablet Take 1 tablet (5 mg total) by mouth daily.  . hydrocortisone (ANUCORT-HC) 25 MG suppository UNWRAP AND INSERT 1 SUPPOSITORY RECTALLY TWICE DAILY  . hydrocortisone (PROCTOZONE-HC) 2.5 % rectal cream USE RECTALLY TWICE DAILY  . hydrocortisone-pramoxine (ANALPRAM-HC) 2.5-1 % rectal cream Place 1 application rectally 2 (two) times daily as needed for hemorrhoids.   . lidocaine (LIDODERM) 5 % APPLY 1 PATCH TOPICALLY ONCE DAILY.MAY WEAR UPTO 12 HOURS MAXIMUM IN A DAY. (Patient not taking: Reported on 02/06/2020)  . metoprolol succinate (TOPROL-XL) 25 MG 24 hr tablet Take 1 tablet (25 mg total) by mouth daily.  Marland Kitchen oxyCODONE-acetaminophen (PERCOCET/ROXICET) 5-325 MG tablet Take 1 tablet by mouth 2 (two) times daily.  . pantoprazole (PROTONIX) 40 MG tablet TAKE 1 TABLET(40 MG) BY MOUTH DAILY  . polyethylene glycol powder (GLYCOLAX/MIRALAX) 17 GM/SCOOP powder Mix one scoop with water and take daily  until stools loosen. May repeat as needed. (Patient not taking: Reported on 02/06/2020)  . pravastatin (PRAVACHOL) 40 MG tablet TAKE 1 TABLET(40 MG) BY MOUTH DAILY  . sertraline (ZOLOFT) 50 MG tablet TAKE 1  TABLET(50 MG) BY MOUTH EVERY MORNING  . Silver (SILVASORB) GEL Apply daily to wound to keep moist. (Patient not taking: Reported on 02/06/2020)   No facility-administered encounter medications on file as of 02/25/2020.     Current Diagnosis/Assessment:    Goals Addressed   None     AFIB   Patient is currently rate controlled. HR *** BPM  Patient has failed these meds in past: *** Patient is currently controlled on the following medications:   . Eliquis 2.5 mg BID (History of GI bleed)  . Metoprolol Succinate 25 mg daily  We discussed:  {CHL HP Upstream Pharmacy discussion:[phone]}  Plan  Continue {CHL HP Upstream Pharmacy Plans:[phone]}  Hypertension   BP goal is:  {CHL HP UPSTREAM Pharmacist BP ranges:[phone]}  Office blood pressures are  BP Readings from Last 3 Encounters:  02/06/20 124/68  02/03/20 138/76  12/22/19 129/68   CMP Latest Ref Rng & Units 02/06/2020 11/06/2019 03/18/2019  Glucose 65 - 99 mg/dL 99 91 159(H)  BUN 7 - 25 mg/dL '12 15 16  ' Creatinine 0.70 - 1.11 mg/dL 0.63(L) 0.49 0.64  Sodium 135 - 146 mmol/L 136 131(L) 137  Potassium 3.5 - 5.3 mmol/L 4.6 4.4 4.9  Chloride 98 - 110 mmol/L 100 99 101  CO2 20 - 32 mmol/L '28 28 31  ' Calcium 8.6 - 10.3 mg/dL 8.9 9.1 9.5  Total Protein 6.5 - 8.1 g/dL - - -  Total Bilirubin 0.3 - 1.2 mg/dL - - -  Alkaline Phos 38 - 126 U/L - - -  AST 15 - 41 U/L - - -  ALT 0 - 44 U/L - - -   Patient checks BP at home {CHL HP BP Monitoring Frequency:[phone]} Patient home BP readings are ranging: ***  Patient has failed these meds in the past: *** Patient is currently {CHL Controlled/Uncontrolled:6287794151} on the following medications:  Marland Kitchen Metoprolol Succinate 25 mg daily   We discussed {CHL HP Upstream Pharmacy discussion:[phone]}  Plan  Continue {CHL HP Upstream Pharmacy Plans:[phone]}   Hyperlipidemia   Embolic CVA 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   LDL goal < 70  Lipid Panel     Component Value Date/Time   CHOL 134  04/16/2018 1155   TRIG 139 04/16/2018 1155   HDL 51 04/16/2018 1155   LDLCALC 55 04/16/2018 1155   LDLDIRECT 68 02/06/2020 1706    Hepatic Function Latest Ref Rng & Units 05/12/2018 04/16/2018 12/31/2017  Total Protein 6.5 - 8.1 g/dL 6.4(L) 6.4 6.1  Albumin 3.5 - 5.0 g/dL 3.5 4.0 3.8  AST 15 - 41 U/L '25 18 21  ' ALT 0 - 44 U/L 24 37 25  Alk Phosphatase 38 - 126 U/L 73 100 72  Total Bilirubin 0.3 - 1.2 mg/dL 0.8 0.8 0.5  Bilirubin, Direct 0.0 - 0.3 mg/dL - - -     The ASCVD Risk score Mikey Bussing DC Jr., et al., 2013) failed to calculate for the following reasons:   The 2013 ASCVD risk score is only valid for ages 52 to 71   The patient has a prior MI or stroke diagnosis   Patient has failed these meds in past: *** Patient is currently {CHL Controlled/Uncontrolled:6287794151} on the following medications:  . Pravastatin 40 mg daily   We discussed:  {CHL HP Upstream Pharmacy discussion:[phone]}  Plan  Continue {CHL HP Upstream Pharmacy Plans:3316610927}  Depression / Anxiety   PHQ9 Score:  PHQ9 SCORE ONLY 04/30/2018 09/26/2016 09/22/2015  PHQ-9 Total Score 0 0 0   GAD7 Score: No flowsheet data found.  Patient has failed these meds in past: *** Patient is currently {CHL Controlled/Uncontrolled:[phone]} on the following medications:  . Alprazolam 0.25 mg QHS  . Sertraline 50 mg daily   We discussed:  ***  Plan  Continue {CHL HP Upstream Pharmacy Plans:3316610927}  BPH   PSA  Date Value Ref Range Status  06/03/2012 1.92 0.10 - 4.00 ng/mL Final  01/23/2011 1.37 0.10 - 4.00 ng/mL Final  07/04/2010 1.28 0.10 - 4.00 ng/mL Final     Patient has failed these meds in past: *** Patient is currently {CHL Controlled/Uncontrolled:[phone]} on the following medications:  . Finasteride 5 mg daily   We discussed:  Some urinary hesitancy   Plan  Continue {CHL HP Upstream Pharmacy Plans:3316610927}  Peptic Ulcer Disease   History of bleeding duodenal ulcer in 2002    Patient has failed these meds in past: *** Patient is currently {CHL Controlled/Uncontrolled:[phone]} on the following medications:  . Pantoprazole 40 mg daily   We discussed:  ***  Plan  Continue {CHL HP Upstream Pharmacy Plans:3316610927}  Chronic Pain    Osteoarthritis of Knee (receives injections via Dr. Delilah Shan to help)   Patient has failed these meds in past: *** Patient is currently {CHL Controlled/Uncontrolled:[phone]} on the following medications:  . Lidocaine 5% patch daily (not taking) . Oxycodone-APAP 5-325 mg BID   We discussed:  ***  Plan  Continue {CHL HP Upstream Pharmacy Plans:3316610927}   Misc / OTC   Patient has failed these meds in past: *** Patient is currently {CHL Controlled/Uncontrolled:[phone]} on the following medications:  . Cetirizine 10 mg QHS  . Clotrimazole 1% cream BID -abdominal candidiasis . Hydrocortione 25 mg suppository BID  . Hydrocortison 2.5% rectal cream  . Hydrocortisone-Pramoxinxe 2.5-1% rectal cream BID PRN  . Miralax 17g daily PRN (not taking) . Silver Gel (not taking)  We discussed:  ***  Plan  Continue {CHL HP Upstream Pharmacy KWIOX:[id number]}  Vaccines   Reviewed and discussed patient's vaccination history.    Immunization History  Administered Date(s) Administered  . H1N1 09/01/2008  . Influenza Inj Mdck Quad Pf 05/27/2018  . Influenza Split 05/15/2011  . Influenza Whole 04/26/2009, 04/25/2013  . Influenza, High Dose Seasonal PF 05/17/2017, 05/12/2019  . Influenza,inj,Quad PF,6+ Mos 05/10/2016  . Influenza-Unspecified 05/03/2014, 05/12/2015, 06/14/2017  . Pneumococcal Conjugate-13 07/21/2013  . Pneumococcal Polysaccharide-23 09/04/2007  . Td 09/04/2007, 03/12/2008  . Tdap 05/03/2014  . Zoster 01/27/2011    Plan  Recommended patient receive *** vaccine in *** office.   Medication Management   Pt uses [facility] for all medications Uses pill box? {Yes or If no, why  not?:20788} Pt endorses ***% compliance  We discussed: ***  Plan  {US Pharmacy ASTM:19622}    Follow up: *** month phone visit  ***

## 2020-02-25 ENCOUNTER — Telehealth: Payer: Medicare Other

## 2020-02-26 ENCOUNTER — Telehealth: Payer: Self-pay | Admitting: Family Medicine

## 2020-02-26 DIAGNOSIS — I10 Essential (primary) hypertension: Secondary | ICD-10-CM | POA: Diagnosis not present

## 2020-02-26 DIAGNOSIS — L89321 Pressure ulcer of left buttock, stage 1: Secondary | ICD-10-CM | POA: Diagnosis not present

## 2020-02-26 DIAGNOSIS — I2510 Atherosclerotic heart disease of native coronary artery without angina pectoris: Secondary | ICD-10-CM | POA: Diagnosis not present

## 2020-02-26 DIAGNOSIS — L89311 Pressure ulcer of right buttock, stage 1: Secondary | ICD-10-CM | POA: Diagnosis not present

## 2020-02-26 DIAGNOSIS — N400 Enlarged prostate without lower urinary tract symptoms: Secondary | ICD-10-CM | POA: Diagnosis not present

## 2020-02-26 DIAGNOSIS — I872 Venous insufficiency (chronic) (peripheral): Secondary | ICD-10-CM | POA: Diagnosis not present

## 2020-02-26 NOTE — Telephone Encounter (Signed)
Anissa from [facility] called to let the provider know that patient has an open wound on his left buttock. She also said that she will be sending an order for provider to sign. She mentioned that patient has a canker sore on the inside of his mouth and his mouth has been sore for about 2 weeks. Please call her back at [phone] if you have any questions.  Thank you, Burley Saver

## 2020-03-04 DIAGNOSIS — I10 Essential (primary) hypertension: Secondary | ICD-10-CM | POA: Diagnosis not present

## 2020-03-04 DIAGNOSIS — L89321 Pressure ulcer of left buttock, stage 1: Secondary | ICD-10-CM | POA: Diagnosis not present

## 2020-03-04 DIAGNOSIS — I2510 Atherosclerotic heart disease of native coronary artery without angina pectoris: Secondary | ICD-10-CM | POA: Diagnosis not present

## 2020-03-04 DIAGNOSIS — L89311 Pressure ulcer of right buttock, stage 1: Secondary | ICD-10-CM | POA: Diagnosis not present

## 2020-03-04 DIAGNOSIS — N400 Enlarged prostate without lower urinary tract symptoms: Secondary | ICD-10-CM | POA: Diagnosis not present

## 2020-03-04 DIAGNOSIS — I872 Venous insufficiency (chronic) (peripheral): Secondary | ICD-10-CM | POA: Diagnosis not present

## 2020-03-08 NOTE — Chronic Care Management (AMB) (Signed)
Chronic Care Management Pharmacy  Name: Brian [name]  MRN: [id number] DOB: May 26, 1933   Chief Complaint/ HPI  Brian [name],  86 y.o. , male presents for their Initial CCM visit with the clinical pharmacist via telephone due to COVID-19 Pandemic.  PCP : Libby Maw, MD Patient Care Team: Libby Maw, MD as PCP - General (Family Medicine) Skeet Latch, MD as PCP - Cardiology (Cardiology) Germaine Pomfret, [facility] as Pharmacist (Pharmacist)  Their chronic conditions include: Hypertension, Hyperlipidemia, Atrial Fibrillation, Coronary Artery Disease, Anxiety, Osteoarthritis, BPH and peptic ulcer disease   Office Visits: 02/06/20: Patient presented to Dr. Ethelene Hal for follow-up. Patient with swollen legs and sore on left legs, otherwise stable. Patient instructed to elevate legs at night and to use clotrimazole on abdomen rash. Referral placed to home health.  11/06/19: Patient presented to Dr. Ethelene Hal for follow-up. Lab work stable, no medication changes made.   Consult Visit: 02/03/20: patient presented to Dr. Fuller Plan (GI) for constipation and mild rectal bleeding. Patient with suspeted candidate dermatitis on abdomne, started on clotrimazole. Patient instructed to continue using hydrocortisone suppositories for rectal bleeding.  12/22/19: Patient presented to Leonia Reader, NP (cardiology) for HTN follow-up.  11/25/19: Patient presented to [place], AuD CCC-A (audiology) for bilateral hearing loss. Patient wears hearing aids.   Today, patient is in good spirits. His daughter and son are very active in his life. His daughter, [name] is a Software engineer and helps with his medications. His son brings him food and brings him to appointments. He has home health once weekly, and PT once weekly. He wishes he could do PT more often so he could regain strength in his legs. He is wheelchair-bound with left hemiplegia. He wishes he could lose weight.   Allergies  Allergen  Reactions  . Novocain [Procaine Hcl]     Irregular heart beat     . Other     Patient had problem with epidural with his right hip surgery. Went to up extremity instead of lower extremity   . Penicillins Itching    Has patient had a PCN reaction causing immediate rash, facial/tongue/throat swelling, SOB or lightheadedness with hypotension: Unk Has patient had a PCN reaction causing severe rash involving mucus membranes or skin necrosis: Unk Has patient had a PCN reaction that required hospitalization: Unk Has patient had a PCN reaction occurring within the last 10 years: No If all of the above answers are "NO", then may proceed with Cephalosporin use.  No reaction noted    Medications: Outpatient Encounter Medications as of 03/09/2020  Medication Sig  . acetaminophen (TYLENOL) 500 MG tablet Take 500 mg by mouth every 6 (six) hours as needed.  . ALPRAZolam (XANAX) 0.25 MG tablet TAKE 1 TABLET(0.25 MG) BY MOUTH AT BEDTIME  . cetirizine (ZYRTEC) 10 MG tablet TAKE 1 TABLET(10 MG) BY MOUTH AT BEDTIME  . clotrimazole (LOTRIMIN AF) 1 % cream Apply 1 application topically 2 (two) times daily.  Marland Kitchen ELIQUIS 2.5 MG TABS tablet TAKE 1 TABLET(2.5 MG) BY MOUTH TWICE DAILY  . hydrocortisone (ANUCORT-HC) 25 MG suppository UNWRAP AND INSERT 1 SUPPOSITORY RECTALLY TWICE DAILY  . hydrocortisone (PROCTOZONE-HC) 2.5 % rectal cream USE RECTALLY TWICE DAILY  . metoprolol succinate (TOPROL-XL) 25 MG 24 hr tablet Take 1 tablet (25 mg total) by mouth daily.  . Multiple Vitamin (MULTIVITAMIN) tablet Take 2 tablets by mouth daily.  . pantoprazole (PROTONIX) 40 MG tablet TAKE 1 TABLET(40 MG) BY MOUTH DAILY  . polyethylene glycol powder (GLYCOLAX/MIRALAX)  17 GM/SCOOP powder Mix one scoop with water and take daily until stools loosen. May repeat as needed.  . pravastatin (PRAVACHOL) 40 MG tablet TAKE 1 TABLET(40 MG) BY MOUTH DAILY  . sertraline (ZOLOFT) 50 MG tablet TAKE 1 TABLET(50 MG) BY MOUTH EVERY MORNING  .  finasteride (PROSCAR) 5 MG tablet Take 1 tablet (5 mg total) by mouth daily. (Patient not taking: Reported on 03/09/2020)  . hydrocortisone-pramoxine (ANALPRAM-HC) 2.5-1 % rectal cream Place 1 application rectally 2 (two) times daily as needed for hemorrhoids.   . lidocaine (LIDODERM) 5 % APPLY 1 PATCH TOPICALLY ONCE DAILY.MAY WEAR UPTO 12 HOURS MAXIMUM IN A DAY. (Patient not taking: Reported on 02/06/2020)  . oxyCODONE-acetaminophen (PERCOCET/ROXICET) 5-325 MG tablet Take 1 tablet by mouth 2 (two) times daily. (Patient not taking: Reported on 03/09/2020)  . Silver (SILVASORB) GEL Apply daily to wound to keep moist. (Patient not taking: Reported on 02/06/2020)   No facility-administered encounter medications on file as of 03/09/2020.     Current Diagnosis/Assessment:  SDOH Interventions     Most Recent Value  SDOH Interventions  Financial Strain Interventions Intervention Not Indicated  Transportation Interventions Intervention Not Indicated      Goals Addressed            This Visit's Progress   . Chronic Care Management       CARE PLAN ENTRY (see longitudinal plan of care for additional care plan information)  Current Barriers:  . Chronic Disease Management support, education, and care coordination needs related to Hypertension, Hyperlipidemia, Atrial Fibrillation, Coronary Artery Disease, Anxiety, Osteoarthritis, BPH and peptic ulcer disease    Hypertension BP Readings from Last 3 Encounters:  02/06/20 124/68  02/03/20 138/76  12/22/19 129/68   . Pharmacist Clinical Goal(s): o Over the next 90 days, patient will work with PharmD and providers to maintain BP goal <140/90 . Current regimen:  o Metoprolol Succinate 25 mg daily  . Patient self care activities - Over the next 90 days, patient will: o Check blood pressure once weekly, document, and provide at future appointments o Ensure daily salt intake < 2300 mg/day  Hyperlipidemia Lab Results  Component Value Date/Time    LDLCALC 55 04/16/2018 11:55 AM   LDLDIRECT 68 02/06/2020 05:06 PM   . Pharmacist Clinical Goal(s): o Over the next 90 days, patient will work with PharmD and providers to maintain LDL goal < 70 . Current regimen:  o Pravastatin 40 mg daily   Medication management . Pharmacist Clinical Goal(s): o Over the next 90 days, patient will work with PharmD and providers to maintain optimal medication adherence . Current pharmacy: [facility] . Interventions o Comprehensive medication review performed. o Continue current medication management strategy . Patient self care activities - Over the next 90 days, patient will: o Take medications as prescribed o Report any questions or concerns to PharmD and/or provider(s)       AFIB   Managed by Dr. Oval Linsey  Patient is currently rate controlled.  Patient has failed these meds in past: warfarin (ineffective)  Patient is currently controlled on the following medications:   . Eliquis 2.5 mg BID (History of GI bleed)  . Metoprolol Succinate 25 mg daily   We discussed:  Denies unusual bruising or bleeding. Denies challenges with affording Eliquis.   Plan  Continue current medications  Hypertension   BP goal is:  <140/90  Office blood pressures are  BP Readings from Last 3 Encounters:  02/06/20 124/68  02/03/20 138/76  12/22/19 129/68  CMP Latest Ref Rng & Units 02/06/2020 11/06/2019 03/18/2019  Glucose 65 - 99 mg/dL 99 91 159(H)  BUN 7 - 25 mg/dL '12 15 16  ' Creatinine 0.70 - 1.11 mg/dL 0.63(L) 0.49 0.64  Sodium 135 - 146 mmol/L 136 131(L) 137  Potassium 3.5 - 5.3 mmol/L 4.6 4.4 4.9  Chloride 98 - 110 mmol/L 100 99 101  CO2 20 - 32 mmol/L '28 28 31  ' Calcium 8.6 - 10.3 mg/dL 8.9 9.1 9.5  Total Protein 6.5 - 8.1 g/dL - - -  Total Bilirubin 0.3 - 1.2 mg/dL - - -  Alkaline Phos 38 - 126 U/L - - -  AST 15 - 41 U/L - - -  ALT 0 - 44 U/L - - -   Patient checks BP at home 1-2x per week Patient home BP readings are ranging: Good, checked  by home health / PT nurse.   Patient has failed these meds in the past: n/a Patient is currently controlled on the following medications:  Marland Kitchen Metoprolol Succinate 25 mg daily   We discussed diet and exercise extensively. Eating more vegetables and fruit, eating more fish, cole slaw. Sometimes will have a hamburger or meatloaf once weekly.   Plan  Continue current medications   Hyperlipidemia   Embolic CVA 1638   LDL goal < 70  Lipid Panel     Component Value Date/Time   CHOL 134 04/16/2018 1155   TRIG 139 04/16/2018 1155   HDL 51 04/16/2018 1155   LDLCALC 55 04/16/2018 1155   LDLDIRECT 68 02/06/2020 1706    Hepatic Function Latest Ref Rng & Units 05/12/2018 04/16/2018 12/31/2017  Total Protein 6.5 - 8.1 g/dL 6.4(L) 6.4 6.1  Albumin 3.5 - 5.0 g/dL 3.5 4.0 3.8  AST 15 - 41 U/L '25 18 21  ' ALT 0 - 44 U/L 24 37 25  Alk Phosphatase 38 - 126 U/L 73 100 72  Total Bilirubin 0.3 - 1.2 mg/dL 0.8 0.8 0.5  Bilirubin, Direct 0.0 - 0.3 mg/dL - - -     The ASCVD Risk score Mikey Bussing DC Jr., et al., 2013) failed to calculate for the following reasons:   The 2013 ASCVD risk score is only valid for ages 89 to 50   The patient has a prior MI or stroke diagnosis   Patient has failed these meds in past: n/a Patient is currently controlled on the following medications:  . Pravastatin 40 mg daily   We discussed:  diet and exercise extensively  Plan  Continue current medications  Depression / Anxiety   PHQ9 Score:  PHQ9 SCORE ONLY 03/09/2020 04/30/2018 09/26/2016  PHQ-9 Total Score 0 0 0   GAD7 Score: No flowsheet data found.  Patient has failed these meds in past: n/a Patient is currently controlled on the following medications:  . Alprazolam 0.25 mg QHS  . Sertraline 50 mg daily   We discussed:  Denies oversedation, dizziness, or unsteadiness from alprazolam. Still has racing thoughts, but has been much better on his current regimen.   Plan  Continue current medications  BPH    PSA  Date Value Ref Range Status  06/03/2012 1.92 0.10 - 4.00 ng/mL Final  01/23/2011 1.37 0.10 - 4.00 ng/mL Final  07/04/2010 1.28 0.10 - 4.00 ng/mL Final     Patient has failed these meds in past: n/a Patient is currently controlled on the following medications:  . Finasteride 5 mg daily   We discussed:  Some urinary hesitancy   Plan  Continue  current medications  Peptic Ulcer Disease   History of bleeding duodenal ulcer in 2002   Patient has failed these meds in past: n/a Patient is currently controlled on the following medications:  . Pantoprazole 40 mg daily   Plan  Continue current medications  Chronic Pain    Osteoarthritis of Knee (receives injections via Dr. Delilah Shan to help)   Patient has failed these meds in past: n/a Patient is currently managed on the following medications:  . Lidocaine 5% patch daily (not taking) . Oxycodone-APAP 5-325 mg BID  . Tylenol Extra Strength 6-8 tablets   We discussed:  Patient with "terrible" headaches that happen almost daily. Counseled patient to not exceed 3000 mg of tylenol daily from ALL sources.   Plan  Continue current medications  Misc / OTC   . Cetirizine 10 mg QHS . Clotrimazole 1% cream BID -abdominal candidiasis (improving)  . Hydrocortione 25 mg suppository BID  . Hydrocortison 2.5% rectal cream  . Hydrocortisone-Pramoxinxe 2.5-1% rectal cream BID PRN  . Miralax 17g daily PRN (not taking) . Silver Gel (not taking) . Chewable Multivitamin (Vitafusion) 2 tablets daily.   We discussed:  Congestion in nose   Plan  Continue current medications  Vaccines   Reviewed and discussed patient's vaccination history.    Immunization History  Administered Date(s) Administered  . H1N1 09/01/2008  . Influenza Inj Mdck Quad Pf 05/27/2018  . Influenza Split 05/15/2011  . Influenza Whole 04/26/2009, 04/25/2013  . Influenza, High Dose Seasonal PF 05/17/2017, 05/12/2019  . Influenza,inj,Quad PF,6+ Mos  05/10/2016  . Influenza-Unspecified 05/03/2014, 05/12/2015, 06/14/2017  . Pneumococcal Conjugate-13 07/21/2013  . Pneumococcal Polysaccharide-23 09/04/2007  . Td 09/04/2007, 03/12/2008  . Tdap 05/03/2014  . Zoster 01/27/2011   Medication Management   Pt uses [facility] for all medications Uses pill box? Yes, daughter helps fill  Plan  Continue current medication management strategy  Follow up: 6 month phone visit  Lena at [facility]  [phone]

## 2020-03-09 ENCOUNTER — Ambulatory Visit: Payer: Medicare Other

## 2020-03-09 DIAGNOSIS — L89321 Pressure ulcer of left buttock, stage 1: Secondary | ICD-10-CM | POA: Diagnosis not present

## 2020-03-09 DIAGNOSIS — I872 Venous insufficiency (chronic) (peripheral): Secondary | ICD-10-CM | POA: Diagnosis not present

## 2020-03-09 DIAGNOSIS — I10 Essential (primary) hypertension: Secondary | ICD-10-CM

## 2020-03-09 DIAGNOSIS — E7800 Pure hypercholesterolemia, unspecified: Secondary | ICD-10-CM

## 2020-03-09 DIAGNOSIS — I2510 Atherosclerotic heart disease of native coronary artery without angina pectoris: Secondary | ICD-10-CM | POA: Diagnosis not present

## 2020-03-09 DIAGNOSIS — N400 Enlarged prostate without lower urinary tract symptoms: Secondary | ICD-10-CM | POA: Diagnosis not present

## 2020-03-09 DIAGNOSIS — L89311 Pressure ulcer of right buttock, stage 1: Secondary | ICD-10-CM | POA: Diagnosis not present

## 2020-03-10 NOTE — Patient Instructions (Addendum)
Visit Information It was great speaking with you today!  Please let me know if you have any questions about our visit.  Goals Addressed            This Visit's Progress   . Chronic Care Management       CARE PLAN ENTRY (see longitudinal plan of care for additional care plan information)  Current Barriers:  . Chronic Disease Management support, education, and care coordination needs related to Hypertension, Hyperlipidemia, Atrial Fibrillation, Coronary Artery Disease, Anxiety, Osteoarthritis, BPH and peptic ulcer disease    Hypertension BP Readings from Last 3 Encounters:  02/06/20 124/68  02/03/20 138/76  12/22/19 129/68   . Pharmacist Clinical Goal(s): o Over the next 90 days, patient will work with PharmD and providers to maintain BP goal <140/90 . Current regimen:  o Metoprolol Succinate 25 mg daily  . Patient self care activities - Over the next 90 days, patient will: o Check blood pressure once weekly, document, and provide at future appointments o Ensure daily salt intake < 2300 mg/day  Hyperlipidemia Lab Results  Component Value Date/Time   LDLCALC 55 04/16/2018 11:55 AM   LDLDIRECT 68 02/06/2020 05:06 PM   . Pharmacist Clinical Goal(s): o Over the next 90 days, patient will work with PharmD and providers to maintain LDL goal < 70 . Current regimen:  o Pravastatin 40 mg daily   Medication management . Pharmacist Clinical Goal(s): o Over the next 90 days, patient will work with PharmD and providers to maintain optimal medication adherence . Current pharmacy: [facility] . Interventions o Comprehensive medication review performed. o Continue current medication management strategy . Patient self care activities - Over the next 90 days, patient will: o Take medications as prescribed o Report any questions or concerns to PharmD and/or provider(s)       Mr. Dorton was given information about Chronic Care Management services today including:  1. CCM service  includes personalized support from designated clinical staff supervised by his physician, including individualized plan of care and coordination with other care providers 2. 24/7 contact phone numbers for assistance for urgent and routine care needs. 3. Standard insurance, coinsurance, copays and deductibles apply for chronic care management only during months in which we provide at least 20 minutes of these services. Most insurances cover these services at 100%, however patients may be responsible for any copay, coinsurance and/or deductible if applicable. This service may help you avoid the need for more expensive face-to-face services. 4. Only one practitioner may furnish and bill the service in a calendar month. 5. The patient may stop CCM services at any time (effective at the end of the month) by phone call to the office staff.  Patient agreed to services and verbal consent obtained.   The patient verbalized understanding of instructions provided today and agreed to receive a mailed copy of patient instruction and/or educational materials. Telephone follow up appointment with pharmacy team member scheduled for: 09/07/20 at 1:00 PM  [place] [facility]  [phone]

## 2020-03-11 DIAGNOSIS — N400 Enlarged prostate without lower urinary tract symptoms: Secondary | ICD-10-CM | POA: Diagnosis not present

## 2020-03-11 DIAGNOSIS — I872 Venous insufficiency (chronic) (peripheral): Secondary | ICD-10-CM | POA: Diagnosis not present

## 2020-03-11 DIAGNOSIS — I10 Essential (primary) hypertension: Secondary | ICD-10-CM | POA: Diagnosis not present

## 2020-03-11 DIAGNOSIS — L89321 Pressure ulcer of left buttock, stage 1: Secondary | ICD-10-CM | POA: Diagnosis not present

## 2020-03-11 DIAGNOSIS — I2510 Atherosclerotic heart disease of native coronary artery without angina pectoris: Secondary | ICD-10-CM | POA: Diagnosis not present

## 2020-03-11 DIAGNOSIS — L89311 Pressure ulcer of right buttock, stage 1: Secondary | ICD-10-CM | POA: Diagnosis not present

## 2020-03-12 ENCOUNTER — Telehealth: Payer: Self-pay | Admitting: Family Medicine

## 2020-03-12 DIAGNOSIS — L89321 Pressure ulcer of left buttock, stage 1: Secondary | ICD-10-CM | POA: Diagnosis not present

## 2020-03-12 DIAGNOSIS — I872 Venous insufficiency (chronic) (peripheral): Secondary | ICD-10-CM | POA: Diagnosis not present

## 2020-03-12 DIAGNOSIS — L89311 Pressure ulcer of right buttock, stage 1: Secondary | ICD-10-CM | POA: Diagnosis not present

## 2020-03-12 DIAGNOSIS — I2510 Atherosclerotic heart disease of native coronary artery without angina pectoris: Secondary | ICD-10-CM | POA: Diagnosis not present

## 2020-03-12 DIAGNOSIS — I10 Essential (primary) hypertension: Secondary | ICD-10-CM | POA: Diagnosis not present

## 2020-03-12 DIAGNOSIS — N400 Enlarged prostate without lower urinary tract symptoms: Secondary | ICD-10-CM | POA: Diagnosis not present

## 2020-03-12 NOTE — Telephone Encounter (Signed)
Anissa with [facility] called to let Dr. Ethelene Hal know that today, Brian [name] has 1+ pitting edema in both of his lower legs and open area to his left lower leg. Also, patient stated he would like the doctor to order him another pair of compression stockings.

## 2020-03-13 DIAGNOSIS — Z8673 Personal history of transient ischemic attack (TIA), and cerebral infarction without residual deficits: Secondary | ICD-10-CM | POA: Diagnosis not present

## 2020-03-13 DIAGNOSIS — I872 Venous insufficiency (chronic) (peripheral): Secondary | ICD-10-CM | POA: Diagnosis not present

## 2020-03-13 DIAGNOSIS — Z7901 Long term (current) use of anticoagulants: Secondary | ICD-10-CM | POA: Diagnosis not present

## 2020-03-13 DIAGNOSIS — L89311 Pressure ulcer of right buttock, stage 1: Secondary | ICD-10-CM | POA: Diagnosis not present

## 2020-03-13 DIAGNOSIS — M1990 Unspecified osteoarthritis, unspecified site: Secondary | ICD-10-CM | POA: Diagnosis not present

## 2020-03-13 DIAGNOSIS — Z87891 Personal history of nicotine dependence: Secondary | ICD-10-CM | POA: Diagnosis not present

## 2020-03-13 DIAGNOSIS — I10 Essential (primary) hypertension: Secondary | ICD-10-CM | POA: Diagnosis not present

## 2020-03-13 DIAGNOSIS — Z86718 Personal history of other venous thrombosis and embolism: Secondary | ICD-10-CM | POA: Diagnosis not present

## 2020-03-13 DIAGNOSIS — I4819 Other persistent atrial fibrillation: Secondary | ICD-10-CM | POA: Diagnosis not present

## 2020-03-13 DIAGNOSIS — M5416 Radiculopathy, lumbar region: Secondary | ICD-10-CM | POA: Diagnosis not present

## 2020-03-13 DIAGNOSIS — Z466 Encounter for fitting and adjustment of urinary device: Secondary | ICD-10-CM | POA: Diagnosis not present

## 2020-03-13 DIAGNOSIS — N400 Enlarged prostate without lower urinary tract symptoms: Secondary | ICD-10-CM | POA: Diagnosis not present

## 2020-03-13 DIAGNOSIS — L89321 Pressure ulcer of left buttock, stage 1: Secondary | ICD-10-CM | POA: Diagnosis not present

## 2020-03-13 DIAGNOSIS — I2510 Atherosclerotic heart disease of native coronary artery without angina pectoris: Secondary | ICD-10-CM | POA: Diagnosis not present

## 2020-03-13 DIAGNOSIS — Z993 Dependence on wheelchair: Secondary | ICD-10-CM | POA: Diagnosis not present

## 2020-03-15 ENCOUNTER — Telehealth: Payer: Self-pay | Admitting: Family Medicine

## 2020-03-15 NOTE — Telephone Encounter (Signed)
Patient's daughter Roxann called again and stated she has not been contacted yet about lift. She said they need it ASAP. She already knew Dr. Ethelene Hal is out of the office and I let her know that the previous message had already requested any other available provider to help. Please call her at [phone] with any updates.

## 2020-03-15 NOTE — Telephone Encounter (Signed)
Patients daughter Vern Claude is calling to see if they can get a lift for patient. She wants the order to go to [facility] (Fax #: [phone], Ph #: [phone] Option 1). The lift is called Neurosurgeon". Please call her [phone] if you have any questions and to let her know that this has been done. She is aware that provider is out of the office this week. She asked (if possible) can someone else do it.

## 2020-03-16 ENCOUNTER — Telehealth: Payer: Self-pay | Admitting: Family Medicine

## 2020-03-16 ENCOUNTER — Telehealth: Payer: Self-pay | Admitting: Cardiovascular Disease

## 2020-03-16 DIAGNOSIS — L89311 Pressure ulcer of right buttock, stage 1: Secondary | ICD-10-CM | POA: Diagnosis not present

## 2020-03-16 DIAGNOSIS — I872 Venous insufficiency (chronic) (peripheral): Secondary | ICD-10-CM | POA: Diagnosis not present

## 2020-03-16 DIAGNOSIS — L89321 Pressure ulcer of left buttock, stage 1: Secondary | ICD-10-CM | POA: Diagnosis not present

## 2020-03-16 DIAGNOSIS — I2510 Atherosclerotic heart disease of native coronary artery without angina pectoris: Secondary | ICD-10-CM | POA: Diagnosis not present

## 2020-03-16 DIAGNOSIS — N400 Enlarged prostate without lower urinary tract symptoms: Secondary | ICD-10-CM | POA: Diagnosis not present

## 2020-03-16 DIAGNOSIS — I10 Essential (primary) hypertension: Secondary | ICD-10-CM | POA: Diagnosis not present

## 2020-03-16 NOTE — Telephone Encounter (Signed)
Patient daughter is calling to check the status of previous message left. Daughter is asking for a call back and update, please advise. CB is [phone].

## 2020-03-16 NOTE — Telephone Encounter (Signed)
Pt's daughter [name] called and said the pt needs an Clinical research associate. Fax number is [phone]. Call Roxanne at [phone]

## 2020-03-16 NOTE — Telephone Encounter (Signed)
Left a message for the patient to call back.  The patient may need to get this from his PCP. Looks like in Epic that they have already reached out to PCP for an order for the lift.

## 2020-03-16 NOTE — Telephone Encounter (Signed)
Brian [name] called and stated that forms were faxed but the only thing that came through the fax machine  was a cover sheet. Caller stated that order number was [id number], please advise. CB is [phone]

## 2020-03-17 ENCOUNTER — Telehealth: Payer: Self-pay | Admitting: Family Medicine

## 2020-03-17 NOTE — Telephone Encounter (Signed)
Brian [name] (works for patient) is calling on behalf of patient regarding the hoyer lift. Patient asked that Brian give [place] a call to check the status of the lift. Patient is aware that provider is out of the office this week. Brian asked that we contact patients daughter with any updates.

## 2020-03-17 NOTE — Telephone Encounter (Signed)
Patient's son, [name] [phone]) called after hours service and stated patient needs refill of Oxycodone.

## 2020-03-18 DIAGNOSIS — I10 Essential (primary) hypertension: Secondary | ICD-10-CM | POA: Diagnosis not present

## 2020-03-18 DIAGNOSIS — N400 Enlarged prostate without lower urinary tract symptoms: Secondary | ICD-10-CM | POA: Diagnosis not present

## 2020-03-18 DIAGNOSIS — L89311 Pressure ulcer of right buttock, stage 1: Secondary | ICD-10-CM | POA: Diagnosis not present

## 2020-03-18 DIAGNOSIS — L89321 Pressure ulcer of left buttock, stage 1: Secondary | ICD-10-CM | POA: Diagnosis not present

## 2020-03-18 DIAGNOSIS — I872 Venous insufficiency (chronic) (peripheral): Secondary | ICD-10-CM | POA: Diagnosis not present

## 2020-03-18 DIAGNOSIS — I2510 Atherosclerotic heart disease of native coronary artery without angina pectoris: Secondary | ICD-10-CM | POA: Diagnosis not present

## 2020-03-19 ENCOUNTER — Other Ambulatory Visit: Payer: Self-pay | Admitting: Family Medicine

## 2020-03-25 DIAGNOSIS — L89311 Pressure ulcer of right buttock, stage 1: Secondary | ICD-10-CM | POA: Diagnosis not present

## 2020-03-25 DIAGNOSIS — N400 Enlarged prostate without lower urinary tract symptoms: Secondary | ICD-10-CM | POA: Diagnosis not present

## 2020-03-25 DIAGNOSIS — I872 Venous insufficiency (chronic) (peripheral): Secondary | ICD-10-CM | POA: Diagnosis not present

## 2020-03-25 DIAGNOSIS — I10 Essential (primary) hypertension: Secondary | ICD-10-CM | POA: Diagnosis not present

## 2020-03-25 DIAGNOSIS — L89321 Pressure ulcer of left buttock, stage 1: Secondary | ICD-10-CM | POA: Diagnosis not present

## 2020-03-25 DIAGNOSIS — I2510 Atherosclerotic heart disease of native coronary artery without angina pectoris: Secondary | ICD-10-CM | POA: Diagnosis not present

## 2020-03-25 NOTE — Telephone Encounter (Signed)
Please see message and advise 

## 2020-03-26 DIAGNOSIS — N400 Enlarged prostate without lower urinary tract symptoms: Secondary | ICD-10-CM | POA: Diagnosis not present

## 2020-03-26 DIAGNOSIS — I10 Essential (primary) hypertension: Secondary | ICD-10-CM | POA: Diagnosis not present

## 2020-03-26 DIAGNOSIS — I872 Venous insufficiency (chronic) (peripheral): Secondary | ICD-10-CM | POA: Diagnosis not present

## 2020-03-26 DIAGNOSIS — L89321 Pressure ulcer of left buttock, stage 1: Secondary | ICD-10-CM | POA: Diagnosis not present

## 2020-03-26 DIAGNOSIS — I2510 Atherosclerotic heart disease of native coronary artery without angina pectoris: Secondary | ICD-10-CM | POA: Diagnosis not present

## 2020-03-26 DIAGNOSIS — L89311 Pressure ulcer of right buttock, stage 1: Secondary | ICD-10-CM | POA: Diagnosis not present

## 2020-03-29 NOTE — Telephone Encounter (Signed)
Patients family calling requesting a referral/order for [employer] lift to help transfer patient at home. Also Home nurse states that has 1+ pitting edema in both of his lower legs and open area to his left lower leg. Patient stated he would like to know if he could have and order for another pair of compression stockings. Please advise.

## 2020-03-29 NOTE — Telephone Encounter (Signed)
Order signed by provider and faxed to [facility]. Conformation printed off.

## 2020-03-29 NOTE — Telephone Encounter (Signed)
Okay for both

## 2020-03-29 NOTE — Telephone Encounter (Signed)
Caller Name: Dolores Lory Call back phone #: [phone] Fax # [phone]  [place] signed Date of order 03/01/2020 Order # [id number] Had the order refaxed and have given to CMA/Tequila

## 2020-03-30 DIAGNOSIS — N400 Enlarged prostate without lower urinary tract symptoms: Secondary | ICD-10-CM | POA: Diagnosis not present

## 2020-03-30 DIAGNOSIS — I10 Essential (primary) hypertension: Secondary | ICD-10-CM | POA: Diagnosis not present

## 2020-03-30 DIAGNOSIS — I2510 Atherosclerotic heart disease of native coronary artery without angina pectoris: Secondary | ICD-10-CM | POA: Diagnosis not present

## 2020-03-30 DIAGNOSIS — L89321 Pressure ulcer of left buttock, stage 1: Secondary | ICD-10-CM | POA: Diagnosis not present

## 2020-03-30 DIAGNOSIS — L89311 Pressure ulcer of right buttock, stage 1: Secondary | ICD-10-CM | POA: Diagnosis not present

## 2020-03-30 DIAGNOSIS — I872 Venous insufficiency (chronic) (peripheral): Secondary | ICD-10-CM | POA: Diagnosis not present

## 2020-03-31 ENCOUNTER — Telehealth: Payer: Self-pay | Admitting: Family Medicine

## 2020-03-31 NOTE — Telephone Encounter (Signed)
Patient called and stated that Dr. Ethelene Hal recommended him to have PT and  wanted to see if he can continue with therapy. Caller stated that a document will be sent over for Dr. Ethelene Hal to sign and fax back. CB is [phone].

## 2020-04-01 ENCOUNTER — Encounter: Payer: Self-pay | Admitting: Family Medicine

## 2020-04-01 ENCOUNTER — Other Ambulatory Visit: Payer: Self-pay | Admitting: Family Medicine

## 2020-04-01 DIAGNOSIS — K219 Gastro-esophageal reflux disease without esophagitis: Secondary | ICD-10-CM

## 2020-04-02 DIAGNOSIS — K5909 Other constipation: Secondary | ICD-10-CM | POA: Diagnosis not present

## 2020-04-02 DIAGNOSIS — Z7401 Bed confinement status: Secondary | ICD-10-CM | POA: Diagnosis not present

## 2020-04-02 DIAGNOSIS — I2510 Atherosclerotic heart disease of native coronary artery without angina pectoris: Secondary | ICD-10-CM | POA: Diagnosis not present

## 2020-04-02 DIAGNOSIS — Z7901 Long term (current) use of anticoagulants: Secondary | ICD-10-CM | POA: Diagnosis not present

## 2020-04-02 DIAGNOSIS — I872 Venous insufficiency (chronic) (peripheral): Secondary | ICD-10-CM | POA: Diagnosis not present

## 2020-04-02 DIAGNOSIS — I10 Essential (primary) hypertension: Secondary | ICD-10-CM | POA: Diagnosis not present

## 2020-04-02 DIAGNOSIS — L89311 Pressure ulcer of right buttock, stage 1: Secondary | ICD-10-CM | POA: Diagnosis not present

## 2020-04-02 DIAGNOSIS — N400 Enlarged prostate without lower urinary tract symptoms: Secondary | ICD-10-CM | POA: Diagnosis not present

## 2020-04-02 DIAGNOSIS — L89321 Pressure ulcer of left buttock, stage 1: Secondary | ICD-10-CM | POA: Diagnosis not present

## 2020-04-06 ENCOUNTER — Telehealth: Payer: Self-pay | Admitting: Family Medicine

## 2020-04-06 DIAGNOSIS — H18413 Arcus senilis, bilateral: Secondary | ICD-10-CM | POA: Diagnosis not present

## 2020-04-06 DIAGNOSIS — Z961 Presence of intraocular lens: Secondary | ICD-10-CM | POA: Diagnosis not present

## 2020-04-06 DIAGNOSIS — H26492 Other secondary cataract, left eye: Secondary | ICD-10-CM | POA: Diagnosis not present

## 2020-04-06 DIAGNOSIS — H26491 Other secondary cataract, right eye: Secondary | ICD-10-CM | POA: Diagnosis not present

## 2020-04-06 NOTE — Telephone Encounter (Signed)
Verbal order for PT given per Liji written order will be sent

## 2020-04-06 NOTE — Telephone Encounter (Signed)
Form signed and faxed for PT

## 2020-04-06 NOTE — Telephone Encounter (Signed)
Brian [name] is calling to get verbal orders for physical therapy for patient to re certify, please advise. CB is [phone]

## 2020-04-07 NOTE — Addendum Note (Signed)
Addended by: Lynda Rainwater on: 04/07/2020 03:52 PM   Modules accepted: Orders

## 2020-04-07 NOTE — Telephone Encounter (Signed)
Spoke with [facility] who states that patient pays for incontinence supplies out of pocket so we would not need to send in order. Number for Norm given to [facility] to give Norm a call per her request for the amount and order for supplies.

## 2020-04-08 ENCOUNTER — Telehealth: Payer: Self-pay | Admitting: Family Medicine

## 2020-04-08 DIAGNOSIS — L89311 Pressure ulcer of right buttock, stage 1: Secondary | ICD-10-CM | POA: Diagnosis not present

## 2020-04-08 DIAGNOSIS — I872 Venous insufficiency (chronic) (peripheral): Secondary | ICD-10-CM | POA: Diagnosis not present

## 2020-04-08 DIAGNOSIS — L89321 Pressure ulcer of left buttock, stage 1: Secondary | ICD-10-CM | POA: Diagnosis not present

## 2020-04-08 DIAGNOSIS — N400 Enlarged prostate without lower urinary tract symptoms: Secondary | ICD-10-CM | POA: Diagnosis not present

## 2020-04-08 DIAGNOSIS — I2510 Atherosclerotic heart disease of native coronary artery without angina pectoris: Secondary | ICD-10-CM | POA: Diagnosis not present

## 2020-04-08 DIAGNOSIS — I10 Essential (primary) hypertension: Secondary | ICD-10-CM | POA: Diagnosis not present

## 2020-04-08 NOTE — Telephone Encounter (Signed)
Liji with [facility] [phone])  needs verbal orders for Home health Physical therapy twice weekly x 4 weeks starting next week, then once weekly x 3 weeks. To work dynamix standing balance and safety with transfers. Also, she want to know if a Sit to Stand device would be better than the manual [facility] lift that the patient is eligible for. If so, please send order for the device. Fax to [place] [phone].

## 2020-04-08 NOTE — Telephone Encounter (Signed)
Spoke with patients daughter who verbally understood order signed and faxed to [place] for [employer], I also gave information to her regarding compression socks advised her to go to [place] supply they will be able to purchase them there. Daughter understood and will await call/information on lift and go pick up socks for patient.

## 2020-04-09 NOTE — Telephone Encounter (Signed)
Verbal order given and Brian [name] is aware that [name] lift is preferred by the family.

## 2020-04-12 DIAGNOSIS — Z466 Encounter for fitting and adjustment of urinary device: Secondary | ICD-10-CM | POA: Diagnosis not present

## 2020-04-12 DIAGNOSIS — I10 Essential (primary) hypertension: Secondary | ICD-10-CM | POA: Diagnosis not present

## 2020-04-12 DIAGNOSIS — M5416 Radiculopathy, lumbar region: Secondary | ICD-10-CM | POA: Diagnosis not present

## 2020-04-12 DIAGNOSIS — N400 Enlarged prostate without lower urinary tract symptoms: Secondary | ICD-10-CM | POA: Diagnosis not present

## 2020-04-12 DIAGNOSIS — Z86718 Personal history of other venous thrombosis and embolism: Secondary | ICD-10-CM | POA: Diagnosis not present

## 2020-04-12 DIAGNOSIS — Z993 Dependence on wheelchair: Secondary | ICD-10-CM | POA: Diagnosis not present

## 2020-04-12 DIAGNOSIS — I2510 Atherosclerotic heart disease of native coronary artery without angina pectoris: Secondary | ICD-10-CM | POA: Diagnosis not present

## 2020-04-12 DIAGNOSIS — I872 Venous insufficiency (chronic) (peripheral): Secondary | ICD-10-CM | POA: Diagnosis not present

## 2020-04-12 DIAGNOSIS — Z8673 Personal history of transient ischemic attack (TIA), and cerebral infarction without residual deficits: Secondary | ICD-10-CM | POA: Diagnosis not present

## 2020-04-12 DIAGNOSIS — Z87891 Personal history of nicotine dependence: Secondary | ICD-10-CM | POA: Diagnosis not present

## 2020-04-12 DIAGNOSIS — M1990 Unspecified osteoarthritis, unspecified site: Secondary | ICD-10-CM | POA: Diagnosis not present

## 2020-04-12 DIAGNOSIS — I4819 Other persistent atrial fibrillation: Secondary | ICD-10-CM | POA: Diagnosis not present

## 2020-04-12 DIAGNOSIS — Z7901 Long term (current) use of anticoagulants: Secondary | ICD-10-CM | POA: Diagnosis not present

## 2020-04-13 ENCOUNTER — Other Ambulatory Visit: Payer: Self-pay | Admitting: Family Medicine

## 2020-04-13 ENCOUNTER — Telehealth: Payer: Self-pay | Admitting: Family Medicine

## 2020-04-13 DIAGNOSIS — M1990 Unspecified osteoarthritis, unspecified site: Secondary | ICD-10-CM | POA: Diagnosis not present

## 2020-04-13 DIAGNOSIS — N400 Enlarged prostate without lower urinary tract symptoms: Secondary | ICD-10-CM | POA: Diagnosis not present

## 2020-04-13 DIAGNOSIS — I10 Essential (primary) hypertension: Secondary | ICD-10-CM | POA: Diagnosis not present

## 2020-04-13 DIAGNOSIS — I872 Venous insufficiency (chronic) (peripheral): Secondary | ICD-10-CM | POA: Diagnosis not present

## 2020-04-13 DIAGNOSIS — I2510 Atherosclerotic heart disease of native coronary artery without angina pectoris: Secondary | ICD-10-CM | POA: Diagnosis not present

## 2020-04-13 DIAGNOSIS — M5416 Radiculopathy, lumbar region: Secondary | ICD-10-CM | POA: Diagnosis not present

## 2020-04-13 DIAGNOSIS — H26491 Other secondary cataract, right eye: Secondary | ICD-10-CM | POA: Diagnosis not present

## 2020-04-13 DIAGNOSIS — M79672 Pain in left foot: Secondary | ICD-10-CM

## 2020-04-13 DIAGNOSIS — M19012 Primary osteoarthritis, left shoulder: Secondary | ICD-10-CM

## 2020-04-13 DIAGNOSIS — M169 Osteoarthritis of hip, unspecified: Secondary | ICD-10-CM

## 2020-04-13 DIAGNOSIS — M25559 Pain in unspecified hip: Secondary | ICD-10-CM

## 2020-04-13 NOTE — Telephone Encounter (Addendum)
Patient called and wanted to see if Dr. Ethelene Hal could send an order for a sit to stand lift to [place], please advise. CB is [phone]

## 2020-04-13 NOTE — Telephone Encounter (Signed)
Patient is calling and requesting a refill for oxycodone- acetaminophen sent [facility] in [place], please advise. CB is [phone]

## 2020-04-13 NOTE — Telephone Encounter (Signed)
Patient calling for refill on pending medication last OV 02/06/20 please advise

## 2020-04-14 MED ORDER — OXYCODONE-ACETAMINOPHEN 5-325 MG PO TABS: 1.0000 | ORAL_TABLET | Freq: Two times a day (BID) | ORAL | 0 refills | Status: DC

## 2020-04-15 NOTE — Telephone Encounter (Signed)
Patient daughter is calling back regarding previous message left, please advise. CB is [phone]

## 2020-04-15 NOTE — Telephone Encounter (Signed)
Caller: Jolinda Croak, friend Phone: [phone]  Norm is not on DPR - he works for Mr. Kneisel and is trying to facilitate the purchase of equipment.  Pt can get reimbursement from his insurance if they have a doctors prescription for the equipment his home health agency, [facility], has recommended. [facility] does not provide this equipment. The model below is what they want to purchase for Mr. Cartlidge.

## 2020-04-15 NOTE — Telephone Encounter (Signed)
Family and friend calling for new order for patient to have sit to stand lift. Is it okay to place the order? Per [facility]home care agency have asked them to void the order for the hoyer lift. Please advise.

## 2020-04-15 NOTE — Telephone Encounter (Signed)
Okay 

## 2020-04-16 DIAGNOSIS — N400 Enlarged prostate without lower urinary tract symptoms: Secondary | ICD-10-CM | POA: Diagnosis not present

## 2020-04-16 DIAGNOSIS — M5416 Radiculopathy, lumbar region: Secondary | ICD-10-CM | POA: Diagnosis not present

## 2020-04-16 DIAGNOSIS — I10 Essential (primary) hypertension: Secondary | ICD-10-CM | POA: Diagnosis not present

## 2020-04-16 DIAGNOSIS — M1990 Unspecified osteoarthritis, unspecified site: Secondary | ICD-10-CM | POA: Diagnosis not present

## 2020-04-16 DIAGNOSIS — I872 Venous insufficiency (chronic) (peripheral): Secondary | ICD-10-CM | POA: Diagnosis not present

## 2020-04-16 DIAGNOSIS — I2510 Atherosclerotic heart disease of native coronary artery without angina pectoris: Secondary | ICD-10-CM | POA: Diagnosis not present

## 2020-04-19 ENCOUNTER — Encounter: Payer: Self-pay | Admitting: Family Medicine

## 2020-04-19 NOTE — Addendum Note (Signed)
Addended by: Jon Billings on: 04/19/2020 04:56 PM   Modules accepted: Orders

## 2020-04-20 NOTE — Telephone Encounter (Signed)
Spoke with patients daughter who states that they have purchased patient a stand up lift to help with patients daily activities. They are needing an order to send to insurance showing that the lift is a medical necessity. Order printed off and signed patients daughter aware and will have someone stop by to pick up.

## 2020-04-20 NOTE — Telephone Encounter (Signed)
Done

## 2020-04-22 DIAGNOSIS — N400 Enlarged prostate without lower urinary tract symptoms: Secondary | ICD-10-CM | POA: Diagnosis not present

## 2020-04-22 DIAGNOSIS — I2510 Atherosclerotic heart disease of native coronary artery without angina pectoris: Secondary | ICD-10-CM | POA: Diagnosis not present

## 2020-04-22 DIAGNOSIS — I10 Essential (primary) hypertension: Secondary | ICD-10-CM | POA: Diagnosis not present

## 2020-04-22 DIAGNOSIS — M1990 Unspecified osteoarthritis, unspecified site: Secondary | ICD-10-CM | POA: Diagnosis not present

## 2020-04-22 DIAGNOSIS — I872 Venous insufficiency (chronic) (peripheral): Secondary | ICD-10-CM | POA: Diagnosis not present

## 2020-04-22 DIAGNOSIS — M5416 Radiculopathy, lumbar region: Secondary | ICD-10-CM | POA: Diagnosis not present

## 2020-04-23 DIAGNOSIS — N400 Enlarged prostate without lower urinary tract symptoms: Secondary | ICD-10-CM | POA: Diagnosis not present

## 2020-04-23 DIAGNOSIS — M1990 Unspecified osteoarthritis, unspecified site: Secondary | ICD-10-CM | POA: Diagnosis not present

## 2020-04-23 DIAGNOSIS — I872 Venous insufficiency (chronic) (peripheral): Secondary | ICD-10-CM | POA: Diagnosis not present

## 2020-04-23 DIAGNOSIS — M5416 Radiculopathy, lumbar region: Secondary | ICD-10-CM | POA: Diagnosis not present

## 2020-04-23 DIAGNOSIS — I10 Essential (primary) hypertension: Secondary | ICD-10-CM | POA: Diagnosis not present

## 2020-04-23 DIAGNOSIS — I2510 Atherosclerotic heart disease of native coronary artery without angina pectoris: Secondary | ICD-10-CM | POA: Diagnosis not present

## 2020-04-27 DIAGNOSIS — G894 Chronic pain syndrome: Secondary | ICD-10-CM | POA: Diagnosis not present

## 2020-04-29 DIAGNOSIS — M1990 Unspecified osteoarthritis, unspecified site: Secondary | ICD-10-CM | POA: Diagnosis not present

## 2020-04-29 DIAGNOSIS — I872 Venous insufficiency (chronic) (peripheral): Secondary | ICD-10-CM | POA: Diagnosis not present

## 2020-04-29 DIAGNOSIS — I10 Essential (primary) hypertension: Secondary | ICD-10-CM | POA: Diagnosis not present

## 2020-04-29 DIAGNOSIS — N400 Enlarged prostate without lower urinary tract symptoms: Secondary | ICD-10-CM | POA: Diagnosis not present

## 2020-04-29 DIAGNOSIS — I2510 Atherosclerotic heart disease of native coronary artery without angina pectoris: Secondary | ICD-10-CM | POA: Diagnosis not present

## 2020-04-29 DIAGNOSIS — M5416 Radiculopathy, lumbar region: Secondary | ICD-10-CM | POA: Diagnosis not present

## 2020-04-30 DIAGNOSIS — M1990 Unspecified osteoarthritis, unspecified site: Secondary | ICD-10-CM | POA: Diagnosis not present

## 2020-04-30 DIAGNOSIS — I2510 Atherosclerotic heart disease of native coronary artery without angina pectoris: Secondary | ICD-10-CM | POA: Diagnosis not present

## 2020-04-30 DIAGNOSIS — N400 Enlarged prostate without lower urinary tract symptoms: Secondary | ICD-10-CM | POA: Diagnosis not present

## 2020-04-30 DIAGNOSIS — M5416 Radiculopathy, lumbar region: Secondary | ICD-10-CM | POA: Diagnosis not present

## 2020-04-30 DIAGNOSIS — I872 Venous insufficiency (chronic) (peripheral): Secondary | ICD-10-CM | POA: Diagnosis not present

## 2020-04-30 DIAGNOSIS — I10 Essential (primary) hypertension: Secondary | ICD-10-CM | POA: Diagnosis not present

## 2020-05-04 ENCOUNTER — Ambulatory Visit: Payer: Medicare Other | Admitting: Family Medicine

## 2020-05-04 DIAGNOSIS — I872 Venous insufficiency (chronic) (peripheral): Secondary | ICD-10-CM | POA: Diagnosis not present

## 2020-05-04 DIAGNOSIS — M1990 Unspecified osteoarthritis, unspecified site: Secondary | ICD-10-CM | POA: Diagnosis not present

## 2020-05-04 DIAGNOSIS — I2510 Atherosclerotic heart disease of native coronary artery without angina pectoris: Secondary | ICD-10-CM | POA: Diagnosis not present

## 2020-05-04 DIAGNOSIS — N400 Enlarged prostate without lower urinary tract symptoms: Secondary | ICD-10-CM | POA: Diagnosis not present

## 2020-05-04 DIAGNOSIS — M5416 Radiculopathy, lumbar region: Secondary | ICD-10-CM | POA: Diagnosis not present

## 2020-05-04 DIAGNOSIS — I10 Essential (primary) hypertension: Secondary | ICD-10-CM | POA: Diagnosis not present

## 2020-05-05 ENCOUNTER — Telehealth: Payer: Self-pay

## 2020-05-05 DIAGNOSIS — N401 Enlarged prostate with lower urinary tract symptoms: Secondary | ICD-10-CM

## 2020-05-05 DIAGNOSIS — I10 Essential (primary) hypertension: Secondary | ICD-10-CM

## 2020-05-05 NOTE — Progress Notes (Addendum)
Chronic Care Management Pharmacy Assistant   Name: Brian [name]  MRN: [id number] DOB: 29-Oct-1932  Reason for Encounter: Medication Review/ General adherence call.   PCP : Libby Maw, MD  Allergies:   Allergies  Allergen Reactions  . Novocain [Procaine Hcl]     Irregular heart beat     . Other     Patient had problem with epidural with his right hip surgery. Went to up extremity instead of lower extremity   . Penicillins Itching    Has patient had a PCN reaction causing immediate rash, facial/tongue/throat swelling, SOB or lightheadedness with hypotension: Unk Has patient had a PCN reaction causing severe rash involving mucus membranes or skin necrosis: Unk Has patient had a PCN reaction that required hospitalization: Unk Has patient had a PCN reaction occurring within the last 10 years: No If all of the above answers are "NO", then may proceed with Cephalosporin use.  No reaction noted    Medications: Outpatient Encounter Medications as of 05/05/2020  Medication Sig  . acetaminophen (TYLENOL) 500 MG tablet Take 500 mg by mouth every 6 (six) hours as needed.  . ALPRAZolam (XANAX) 0.25 MG tablet TAKE 1 TABLET(0.25 MG) BY MOUTH AT BEDTIME  . cetirizine (ZYRTEC) 10 MG tablet TAKE 1 TABLET(10 MG) BY MOUTH AT BEDTIME  . clotrimazole (LOTRIMIN AF) 1 % cream Apply 1 application topically 2 (two) times daily.  Marland Kitchen ELIQUIS 2.5 MG TABS tablet TAKE 1 TABLET(2.5 MG) BY MOUTH TWICE DAILY  . finasteride (PROSCAR) 5 MG tablet TAKE 1 TABLET(5 MG) BY MOUTH DAILY  . hydrocortisone (ANUCORT-HC) 25 MG suppository UNWRAP AND INSERT 1 SUPPOSITORY RECTALLY TWICE DAILY  . hydrocortisone (PROCTOZONE-HC) 2.5 % rectal cream USE RECTALLY TWICE DAILY  . hydrocortisone-pramoxine (ANALPRAM-HC) 2.5-1 % rectal cream Place 1 application rectally 2 (two) times daily as needed for hemorrhoids.   . lidocaine (LIDODERM) 5 % APPLY 1 PATCH TOPICALLY ONCE DAILY.MAY WEAR UPTO 12 HOURS MAXIMUM IN A DAY.  (Patient not taking: Reported on 02/06/2020)  . metoprolol succinate (TOPROL-XL) 25 MG 24 hr tablet Take 1 tablet (25 mg total) by mouth daily.  . Multiple Vitamin (MULTIVITAMIN) tablet Take 2 tablets by mouth daily.  Marland Kitchen oxyCODONE-acetaminophen (PERCOCET/ROXICET) 5-325 MG tablet Take 1 tablet by mouth 2 (two) times daily.  . pantoprazole (PROTONIX) 40 MG tablet TAKE 1 TABLET(40 MG) BY MOUTH DAILY  . polyethylene glycol powder (GLYCOLAX/MIRALAX) 17 GM/SCOOP powder Mix one scoop with water and take daily until stools loosen. May repeat as needed.  . pravastatin (PRAVACHOL) 40 MG tablet TAKE 1 TABLET(40 MG) BY MOUTH DAILY  . sertraline (ZOLOFT) 50 MG tablet TAKE 1 TABLET(50 MG) BY MOUTH EVERY MORNING  . Silver (SILVASORB) GEL Apply daily to wound to keep moist. (Patient not taking: Reported on 02/06/2020)   No facility-administered encounter medications on file as of 05/05/2020.    Current Diagnosis: Patient Active Problem List   Diagnosis Date Noted  . Venous ulcer, limited to breakdown of skin ([place]) 02/06/2020  . Swelling of lower extremity 02/06/2020  . Left ear pain 05/20/2019  . Pressure injury of buttock, stage 1 05/20/2019  . Debilitated 05/20/2019  . Elevated glucose 04/08/2019  . Serum potassium elevated 03/18/2019  . Elevated cholesterol 09/24/2018  . Gait disturbance 09/24/2018  . Anxiety 04/30/2018  . Lumbar radiculopathy 02/14/2018  . Pain in joint of left shoulder 12/28/2017  . Osteoarthritis of left glenohumeral joint 12/28/2017  . Pain in left foot 10/23/2017  . Coronary artery disease involving  native coronary artery of native heart without angina pectoris   . Persistent atrial fibrillation ([place])   . Dysarthria, post-stroke   . Dysphagia, post-stroke   . Cerebrovascular accident (CVA) due to thrombosis of precerebral artery ([place])   . Scoliosis (and kyphoscoliosis), idiopathic 01/26/2014  . Arthralgia of hip 02/07/2012  . Osteoarthritis of hip 02/07/2012  . Sacroiliitis  ([place]) 02/07/2012  . ROTATOR CUFF SYNDROME, LEFT 03/07/2010  . OTH MALIG NEOPLASM SKIN OTH&UNSPEC PARTS FACE 12/06/2009  . ACTINIC KERATOSIS, HEAD 09/06/2009  . Osteoarthritis 02/20/2009  . History of mitral valve repair 02/20/2009  . HIP REPLACEMENT, TOTAL, HX OF 02/20/2009  . APPENDECTOMY, HX OF 02/20/2009  . HERNIORRHAPHY, HX OF 02/20/2009  . EPISTAXIS, RECURRENT 11/19/2008  . UNS ADVRS EFF UNS RX MEDICINAL&BIOLOGICAL SBSTNC 06/12/2008  . OTHER AND UNSPECIFIED MITRAL VALVE DISEASES 03/12/2008  . ATRIAL FLUTTER 03/12/2008  . Essential hypertension 03/06/2007  . PEPTIC ULCER DISEASE 03/06/2007  . BENIGN PROSTATIC HYPERTROPHY 03/06/2007  . Coronary atherosclerosis 02/13/2007  . ROSACEA 02/13/2007  . DVT, HX OF 02/13/2007     Follow-Up:  Pharmacist Review   Called patient and discussed medication adherence  with patient, no issues at this time with current medication.  Patient states he is having a hard time with his sinuses.Patient reports it is hard for him to breathe,sleep ,and he has a headache that is located in the middle of his forehead.Patient states Left side of his face is swollen, and he has a sore throat.Patient denies of chest pain and blurry vision.  Patient is requesting if he can get a RX of doxycycline sent to [facility] in [place] to help with relief. Patient states he was taking that before, and the doxycycline help out a lot.  Patient denies ED visit since his last CPP follow up.  Patient denies any side effects with his medication. Patient denies any problems with his current pharmacy  [place] [place] Pharmacist Assistant [phone]   Addendum 05/05/20:  Patient with complaints of acute/chronic sinusitis symptoms without relief from Zrytec. Patient requesting doxycycline to help with symptoms, but would prefer to try other agents first.   Recommend switching to Xyzal or Claritin since Zrytec has been ineffective.  Recommend starting  Mucinex and Saline Nasal spray OTC to help with congestion.  If symptoms not improved by PCP follow-up on 05/11/20, could consider Doxycycline at that time.  Doristine Section Clinical Pharmacist [facility]  [phone]

## 2020-05-07 ENCOUNTER — Telehealth: Payer: Self-pay | Admitting: Family Medicine

## 2020-05-07 DIAGNOSIS — N400 Enlarged prostate without lower urinary tract symptoms: Secondary | ICD-10-CM | POA: Diagnosis not present

## 2020-05-07 DIAGNOSIS — I872 Venous insufficiency (chronic) (peripheral): Secondary | ICD-10-CM | POA: Diagnosis not present

## 2020-05-07 DIAGNOSIS — I10 Essential (primary) hypertension: Secondary | ICD-10-CM | POA: Diagnosis not present

## 2020-05-07 DIAGNOSIS — M5416 Radiculopathy, lumbar region: Secondary | ICD-10-CM | POA: Diagnosis not present

## 2020-05-07 DIAGNOSIS — M1990 Unspecified osteoarthritis, unspecified site: Secondary | ICD-10-CM | POA: Diagnosis not present

## 2020-05-07 DIAGNOSIS — I2510 Atherosclerotic heart disease of native coronary artery without angina pectoris: Secondary | ICD-10-CM | POA: Diagnosis not present

## 2020-05-07 NOTE — Telephone Encounter (Signed)
Colletta Maryland is calling and wanted to Dr. Ethelene Hal received confirmation of order forms and would like a call back, please advise. CB is [phone] opt 2

## 2020-05-10 ENCOUNTER — Other Ambulatory Visit: Payer: Self-pay

## 2020-05-10 NOTE — Telephone Encounter (Signed)
Spoke with Colletta Maryland who verbally understood forms faxed today. New number given to send directly to her.

## 2020-05-11 ENCOUNTER — Ambulatory Visit (INDEPENDENT_AMBULATORY_CARE_PROVIDER_SITE_OTHER): Payer: Medicare Other | Admitting: Family Medicine

## 2020-05-11 ENCOUNTER — Encounter: Payer: Self-pay | Admitting: Family Medicine

## 2020-05-11 VITALS — BP 120/68 | HR 63 | Temp 97.6°F | Ht 66.0 in | Wt 189.0 lb

## 2020-05-11 DIAGNOSIS — I480 Paroxysmal atrial fibrillation: Secondary | ICD-10-CM | POA: Diagnosis not present

## 2020-05-11 DIAGNOSIS — I10 Essential (primary) hypertension: Secondary | ICD-10-CM

## 2020-05-11 DIAGNOSIS — Z23 Encounter for immunization: Secondary | ICD-10-CM

## 2020-05-11 DIAGNOSIS — R3911 Hesitancy of micturition: Secondary | ICD-10-CM

## 2020-05-11 DIAGNOSIS — N401 Enlarged prostate with lower urinary tract symptoms: Secondary | ICD-10-CM | POA: Diagnosis not present

## 2020-05-11 DIAGNOSIS — F419 Anxiety disorder, unspecified: Secondary | ICD-10-CM

## 2020-05-11 DIAGNOSIS — J019 Acute sinusitis, unspecified: Secondary | ICD-10-CM | POA: Insufficient documentation

## 2020-05-11 DIAGNOSIS — M8949 Other hypertrophic osteoarthropathy, multiple sites: Secondary | ICD-10-CM | POA: Diagnosis not present

## 2020-05-11 DIAGNOSIS — M25559 Pain in unspecified hip: Secondary | ICD-10-CM

## 2020-05-11 DIAGNOSIS — M169 Osteoarthritis of hip, unspecified: Secondary | ICD-10-CM | POA: Diagnosis not present

## 2020-05-11 DIAGNOSIS — M159 Polyosteoarthritis, unspecified: Secondary | ICD-10-CM

## 2020-05-11 DIAGNOSIS — R269 Unspecified abnormalities of gait and mobility: Secondary | ICD-10-CM

## 2020-05-11 LAB — COMPREHENSIVE METABOLIC PANEL
ALT: 17 U/L (ref 0–53)
AST: 21 U/L (ref 0–37)
Albumin: 4.1 g/dL (ref 3.5–5.2)
Alkaline Phosphatase: 78 U/L (ref 39–117)
BUN: 16 mg/dL (ref 6–23)
CO2: 29 mEq/L (ref 19–32)
Calcium: 9.2 mg/dL (ref 8.4–10.5)
Chloride: 101 mEq/L (ref 96–112)
Creatinine, Ser: 0.68 mg/dL (ref 0.40–1.50)
GFR: 110.74 mL/min (ref >60.00)
Glucose, Bld: 96 mg/dL (ref 70–99)
Potassium: 4.6 mEq/L (ref 3.5–5.1)
Sodium: 135 mEq/L (ref 135–145)
Total Bilirubin: 0.5 mg/dL (ref 0.2–1.2)
Total Protein: 6.6 g/dL (ref 6.0–8.3)

## 2020-05-11 MED ORDER — DOXYCYCLINE HYCLATE 100 MG PO TABS: 100.0000 mg | ORAL_TABLET | Freq: Two times a day (BID) | ORAL | 0 refills | Status: DC

## 2020-05-11 NOTE — Progress Notes (Signed)
Established Patient Office Visit  Subjective:  Patient ID: Brian [name], male    DOB: 12/29/1932  Age: 86 y.o. MRN: [id number]  CC:  Chief Complaint  Patient presents with  . Follow-up    6 months follow up.     HPI Brian [name] presents for follow-up of atrial fibrillation, elevated cholesterol, elevated cholesterol, anxiety and chronic pain.  Atrophia/hypertension controlled with low-dose metoprolol and Eliquis.  Cholesterol controlled with pravastatin.  Anxiety controlled with nightly alprazolam and Zoloft..  Chronic pain treated with oxycodone and Tylenol.  Left-sided hemiparesis persist status post CVA in 2017.  He has ongoing physical therapy for this issue.  He needs assistance with transfers from his wheelchair.  Complains of facial pressure and heaviness in the sinus areas.  Zyrtec does not seem to be helping.  Past Medical History:  Diagnosis Date  . Anemia   . Atrial flutter ([place])   . BPH (benign prostatic hypertrophy)   . Epistaxis   . Hemorrhage of gastrointestinal tract, unspecified   . History of open heart surgery   . Hypertension   . Osteoarthrosis, unspecified whether generalized or localized, unspecified site   . Personal history of venous thrombosis and embolism   . Rosacea   . Stroke ([place])   . TIA (transient ischemic attack)     Past Surgical History:  Procedure Laterality Date  . APPENDECTOMY    . HERNIA REPAIR    . IR GENERIC HISTORICAL  05/04/2016   IR PERCUTANEOUS ART THROMBECTOMY/INFUSION INTRACRANIAL INC DIAG ANGIO 05/04/2016 [name], MD [facility]  . IR GENERIC HISTORICAL  05/04/2016   IR ANGIO VERTEBRAL SEL SUBCLAVIAN INNOMINATE UNI R MOD SED 05/04/2016 [name], MD [facility]  . IR GENERIC HISTORICAL  06/07/2016   IR RADIOLOGIST EVAL & MGMT 06/07/2016 [facility]  . JOINT REPLACEMENT    . MITRAL VALVE REPAIR    . mvp repair    . RADIOLOGY WITH ANESTHESIA N/A 05/04/2016   Procedure: RADIOLOGY WITH ANESTHESIA;   Surgeon: [name], MD;  Location: [facility];  Service: Radiology;  Laterality: N/A;  . TOTAL HIP ARTHROPLASTY      Family History  Problem Relation Age of Onset  . Rheumatic fever Mother   . Coronary artery disease Father     Social History   Socioeconomic History  . Marital status: Married    Spouse name: Not on file  . Number of children: Not on file  . Years of education: Not on file  . Highest education level: Not on file  Occupational History  . Not on file  Tobacco Use  . Smoking status: Former Research scientist (life sciences)  . Smokeless tobacco: Never Used  . Tobacco comment: quit 50 yr ago  Vaping Use  . Vaping Use: Never used  Substance and Sexual Activity  . Alcohol use: No  . Drug use: No  . Sexual activity: Not on file  Other Topics Concern  . Not on file  Social History Narrative  . Not on file   Social Determinants of Health   Financial Resource Strain: Low Risk   . Difficulty of Paying Living Expenses: Not hard at all  Food Insecurity:   . Worried About Charity fundraiser in the Last Year: Not on file  . Ran Out of Food in the Last Year: Not on file  Transportation Needs: No Transportation Needs  . Lack of Transportation (Medical): No  . Lack of Transportation (Non-Medical): No  Physical Activity:   .  Days of Exercise per Week: Not on file  . Minutes of Exercise per Session: Not on file  Stress:   . Feeling of Stress : Not on file  Social Connections:   . Frequency of Communication with Friends and Family: Not on file  . Frequency of Social Gatherings with Friends and Family: Not on file  . Attends Religious Services: Not on file  . Active Member of Clubs or Organizations: Not on file  . Attends Archivist Meetings: Not on file  . Marital Status: Not on file  Intimate Partner Violence:   . Fear of Current or Ex-Partner: Not on file  . Emotionally Abused: Not on file  . Physically Abused: Not on file  . Sexually Abused: Not on file    Outpatient  Medications Prior to Visit  Medication Sig Dispense Refill  . acetaminophen (TYLENOL) 500 MG tablet Take 500 mg by mouth every 6 (six) hours as needed.    . ALPRAZolam (XANAX) 0.25 MG tablet TAKE 1 TABLET(0.25 MG) BY MOUTH AT BEDTIME 90 tablet 1  . cetirizine (ZYRTEC) 10 MG tablet TAKE 1 TABLET(10 MG) BY MOUTH AT BEDTIME 90 tablet 2  . chlorhexidine (PERIDEX) 0.12 % solution chlorhexidine gluconate 0.12 % mouthwash  SWISH GENTLY WITH 1/2 CAPFUL FOR 30 SECONDS TWICE DAILY AFTER EATING.    . clotrimazole (LOTRIMIN AF) 1 % cream Apply 1 application topically 2 (two) times daily. 30 g 0  . ELIQUIS 2.5 MG TABS tablet TAKE 1 TABLET(2.5 MG) BY MOUTH TWICE DAILY 180 tablet 1  . finasteride (PROSCAR) 5 MG tablet TAKE 1 TABLET(5 MG) BY MOUTH DAILY 90 tablet 3  . fluticasone (FLONASE) 50 MCG/ACT nasal spray fluticasone propionate 50 mcg/actuation nasal spray,suspension    . hydrocortisone (ANUCORT-HC) 25 MG suppository UNWRAP AND INSERT 1 SUPPOSITORY RECTALLY TWICE DAILY 30 suppository 10  . hydrocortisone (PROCTOZONE-HC) 2.5 % rectal cream USE RECTALLY TWICE DAILY 30 g 0  . hydrocortisone-pramoxine (ANALPRAM-HC) 2.5-1 % rectal cream Place 1 application rectally 2 (two) times daily as needed for hemorrhoids.     . metoprolol succinate (TOPROL-XL) 25 MG 24 hr tablet Take 1 tablet (25 mg total) by mouth daily. 90 tablet 3  . Multiple Vitamin (MULTIVITAMIN) tablet Take 2 tablets by mouth daily.    Marland Kitchen oxyCODONE-acetaminophen (PERCOCET/ROXICET) 5-325 MG tablet Take 1 tablet by mouth 2 (two) times daily. 180 tablet 0  . pantoprazole (PROTONIX) 40 MG tablet TAKE 1 TABLET(40 MG) BY MOUTH DAILY 90 tablet 0  . pravastatin (PRAVACHOL) 40 MG tablet TAKE 1 TABLET(40 MG) BY MOUTH DAILY 90 tablet 3  . sertraline (ZOLOFT) 50 MG tablet TAKE 1 TABLET(50 MG) BY MOUTH EVERY MORNING 90 tablet 3  . lidocaine (LIDODERM) 5 % APPLY 1 PATCH TOPICALLY ONCE DAILY.MAY WEAR UPTO 12 HOURS MAXIMUM IN A DAY. (Patient not taking: Reported on  02/06/2020)    . polyethylene glycol powder (GLYCOLAX/MIRALAX) 17 GM/SCOOP powder Mix one scoop with water and take daily until stools loosen. May repeat as needed. (Patient not taking: Reported on 05/11/2020) 850 g 1  . Silver (SILVASORB) GEL Apply daily to wound to keep moist. (Patient not taking: Reported on 02/06/2020) 44.4 mL 0   No facility-administered medications prior to visit.    Allergies  Allergen Reactions  . Novocain [Procaine Hcl]     Irregular heart beat     . Other     Patient had problem with epidural with his right hip surgery. Went to up extremity instead of lower extremity   .  Penicillins Itching    Has patient had a PCN reaction causing immediate rash, facial/tongue/throat swelling, SOB or lightheadedness with hypotension: Unk Has patient had a PCN reaction causing severe rash involving mucus membranes or skin necrosis: Unk Has patient had a PCN reaction that required hospitalization: Unk Has patient had a PCN reaction occurring within the last 10 years: No If all of the above answers are "NO", then may proceed with Cephalosporin use.  No reaction noted    ROS Review of Systems  Constitutional: Negative for chills, diaphoresis, fatigue, fever and unexpected weight change.  HENT: Positive for congestion, postnasal drip and sinus pressure.   Eyes: Negative for photophobia and visual disturbance.  Respiratory: Negative.   Cardiovascular: Negative.  Negative for palpitations.  Gastrointestinal: Negative.   Endocrine: Negative for polyphagia and polyuria.  Genitourinary: Negative for difficulty urinating, frequency and urgency.  Musculoskeletal: Positive for gait problem.  Neurological: Positive for weakness and numbness.  Hematological: Does not bruise/bleed easily.  Psychiatric/Behavioral: Negative.       Objective:    Physical Exam Vitals and nursing note reviewed.  Constitutional:      General: He is not in acute distress.    Appearance: Normal  appearance. He is not ill-appearing, toxic-appearing or diaphoretic.  HENT:     Head: Normocephalic and atraumatic.     Right Ear: External ear normal.     Left Ear: External ear normal.  Eyes:     General: No scleral icterus.       Right eye: No discharge.        Left eye: No discharge.     Conjunctiva/sclera: Conjunctivae normal.  Cardiovascular:     Rate and Rhythm: Normal rate and regular rhythm.  Pulmonary:     Effort: Pulmonary effort is normal.     Breath sounds: Normal breath sounds.  Musculoskeletal:     Cervical back: No rigidity or tenderness.  Lymphadenopathy:     Cervical: No cervical adenopathy.  Skin:    General: Skin is warm and dry.  Neurological:     Mental Status: He is alert and oriented to person, place, and time.  Psychiatric:        Mood and Affect: Mood normal.        Behavior: Behavior normal.     BP 120/68   Pulse 63   Temp 97.6 F (36.4 C) (Tympanic)   Ht 5\' 6"  (1.676 m)   SpO2 96%   BMI 29.86 kg/m  Wt Readings from Last 3 Encounters:  02/06/20 185 lb (83.9 kg)  12/22/19 175 lb (79.4 kg)  07/08/19 160 lb (72.6 kg)     Health Maintenance Due  Topic Date Due  . INFLUENZA VACCINE  03/21/2020    There are no preventive care reminders to display for this patient.  Lab Results  Component Value Date   TSH 2.99 09/26/2016   Lab Results  Component Value Date   WBC 8.4 11/06/2019   HGB 15.1 11/06/2019   HCT 44.9 11/06/2019   MCV 95.8 11/06/2019   PLT 241.0 11/06/2019   Lab Results  Component Value Date   NA 136 02/06/2020   K 4.6 02/06/2020   CO2 28 02/06/2020   GLUCOSE 99 02/06/2020   BUN 12 02/06/2020   CREATININE 0.63 (L) 02/06/2020   BILITOT 0.8 05/12/2018   ALKPHOS 73 05/12/2018   AST 25 05/12/2018   ALT 24 05/12/2018   PROT 6.4 (L) 05/12/2018   ALBUMIN 3.5 05/12/2018   CALCIUM 8.9 02/06/2020  ANIONGAP 13 11/03/2018   GFR 161.83 11/06/2019   Lab Results  Component Value Date   CHOL 134 04/16/2018   Lab Results   Component Value Date   HDL 51 04/16/2018   Lab Results  Component Value Date   LDLCALC 55 04/16/2018   Lab Results  Component Value Date   TRIG 139 04/16/2018   Lab Results  Component Value Date   CHOLHDL 2.6 04/16/2018   Lab Results  Component Value Date   HGBA1C 4.9 04/08/2019      Assessment & Plan:   Problem List Items Addressed This Visit      Cardiovascular and Mediastinum   Essential hypertension - Primary   Relevant Orders   Comprehensive metabolic panel   Paroxysmal atrial fibrillation (HCC)     Respiratory   Acute non-recurrent sinusitis   Relevant Medications   fluticasone (FLONASE) 50 MCG/ACT nasal spray   doxycycline (VIBRA-TABS) 100 MG tablet     Musculoskeletal and Integument   Osteoarthritis   Osteoarthritis of hip     Genitourinary   Benign prostatic hyperplasia with urinary hesitancy     Other   Arthralgia of hip   Anxiety   Gait disturbance   Need for influenza vaccination   Relevant Orders   Flu Vaccine QUAD High Dose(Fluad)      Meds ordered this encounter  Medications  . doxycycline (VIBRA-TABS) 100 MG tablet    Sig: Take 1 tablet (100 mg total) by mouth 2 (two) times daily.    Dispense:  20 tablet    Refill:  0    Follow-up: Return in about 6 months (around 11/08/2020).  Continue medications as listed above.  Continue work with physical therapy for upper body strengthening.  Doxycycline for 10 days for sinusitis.  Libby Maw, MD

## 2020-05-12 ENCOUNTER — Telehealth: Payer: Self-pay | Admitting: Family Medicine

## 2020-05-12 ENCOUNTER — Encounter: Payer: Self-pay | Admitting: Family Medicine

## 2020-05-12 DIAGNOSIS — M1990 Unspecified osteoarthritis, unspecified site: Secondary | ICD-10-CM | POA: Diagnosis not present

## 2020-05-12 DIAGNOSIS — Z466 Encounter for fitting and adjustment of urinary device: Secondary | ICD-10-CM | POA: Diagnosis not present

## 2020-05-12 DIAGNOSIS — I872 Venous insufficiency (chronic) (peripheral): Secondary | ICD-10-CM | POA: Diagnosis not present

## 2020-05-12 DIAGNOSIS — I10 Essential (primary) hypertension: Secondary | ICD-10-CM | POA: Diagnosis not present

## 2020-05-12 DIAGNOSIS — I2510 Atherosclerotic heart disease of native coronary artery without angina pectoris: Secondary | ICD-10-CM | POA: Diagnosis not present

## 2020-05-12 DIAGNOSIS — N400 Enlarged prostate without lower urinary tract symptoms: Secondary | ICD-10-CM | POA: Diagnosis not present

## 2020-05-12 DIAGNOSIS — M5416 Radiculopathy, lumbar region: Secondary | ICD-10-CM | POA: Diagnosis not present

## 2020-05-12 DIAGNOSIS — Z8673 Personal history of transient ischemic attack (TIA), and cerebral infarction without residual deficits: Secondary | ICD-10-CM | POA: Diagnosis not present

## 2020-05-12 DIAGNOSIS — Z86718 Personal history of other venous thrombosis and embolism: Secondary | ICD-10-CM | POA: Diagnosis not present

## 2020-05-12 DIAGNOSIS — Z7901 Long term (current) use of anticoagulants: Secondary | ICD-10-CM | POA: Diagnosis not present

## 2020-05-12 DIAGNOSIS — I4819 Other persistent atrial fibrillation: Secondary | ICD-10-CM | POA: Diagnosis not present

## 2020-05-12 DIAGNOSIS — Z993 Dependence on wheelchair: Secondary | ICD-10-CM | POA: Diagnosis not present

## 2020-05-12 DIAGNOSIS — Z87891 Personal history of nicotine dependence: Secondary | ICD-10-CM | POA: Diagnosis not present

## 2020-05-12 NOTE — Telephone Encounter (Signed)
Patient is calling and wanted to see if he was a candidate to get the pfizer   booster vaccine, please advise. CB is [phone]

## 2020-05-13 DIAGNOSIS — I2510 Atherosclerotic heart disease of native coronary artery without angina pectoris: Secondary | ICD-10-CM | POA: Diagnosis not present

## 2020-05-13 DIAGNOSIS — I10 Essential (primary) hypertension: Secondary | ICD-10-CM | POA: Diagnosis not present

## 2020-05-13 DIAGNOSIS — I872 Venous insufficiency (chronic) (peripheral): Secondary | ICD-10-CM | POA: Diagnosis not present

## 2020-05-13 DIAGNOSIS — N400 Enlarged prostate without lower urinary tract symptoms: Secondary | ICD-10-CM | POA: Diagnosis not present

## 2020-05-13 DIAGNOSIS — M1990 Unspecified osteoarthritis, unspecified site: Secondary | ICD-10-CM | POA: Diagnosis not present

## 2020-05-13 DIAGNOSIS — M5416 Radiculopathy, lumbar region: Secondary | ICD-10-CM | POA: Diagnosis not present

## 2020-05-17 ENCOUNTER — Telehealth: Payer: Self-pay | Admitting: Family Medicine

## 2020-05-17 NOTE — Telephone Encounter (Signed)
Can be seen at Urgent Care.

## 2020-05-17 NOTE — Telephone Encounter (Signed)
Patient is calling and requesting a call back regarding labs, please advise. CB is [phone]

## 2020-05-17 NOTE — Telephone Encounter (Signed)
Spoke with patient who's calling regarding his big toe that was bleeding yesterday. Per patient he have not injured his toe in any way the toe does not hurt and he have not cut his toe nails in 6 weeks. Advised patient to come in for OV to have his toe checked. Patient states that he only has transportation on Tuesdays and he has to go to the dentist this Tuesday he will keep an eye on it and give [place] a call if it does not get any better. No bleeding from his toe today

## 2020-05-18 DIAGNOSIS — M1990 Unspecified osteoarthritis, unspecified site: Secondary | ICD-10-CM | POA: Diagnosis not present

## 2020-05-18 DIAGNOSIS — I872 Venous insufficiency (chronic) (peripheral): Secondary | ICD-10-CM | POA: Diagnosis not present

## 2020-05-18 DIAGNOSIS — I2510 Atherosclerotic heart disease of native coronary artery without angina pectoris: Secondary | ICD-10-CM | POA: Diagnosis not present

## 2020-05-18 DIAGNOSIS — N400 Enlarged prostate without lower urinary tract symptoms: Secondary | ICD-10-CM | POA: Diagnosis not present

## 2020-05-18 DIAGNOSIS — M5416 Radiculopathy, lumbar region: Secondary | ICD-10-CM | POA: Diagnosis not present

## 2020-05-18 DIAGNOSIS — I10 Essential (primary) hypertension: Secondary | ICD-10-CM | POA: Diagnosis not present

## 2020-05-22 DIAGNOSIS — G894 Chronic pain syndrome: Secondary | ICD-10-CM | POA: Diagnosis not present

## 2020-05-22 DIAGNOSIS — K5909 Other constipation: Secondary | ICD-10-CM | POA: Diagnosis not present

## 2020-05-22 DIAGNOSIS — K219 Gastro-esophageal reflux disease without esophagitis: Secondary | ICD-10-CM | POA: Diagnosis not present

## 2020-05-22 DIAGNOSIS — N400 Enlarged prostate without lower urinary tract symptoms: Secondary | ICD-10-CM | POA: Diagnosis not present

## 2020-05-22 DIAGNOSIS — I10 Essential (primary) hypertension: Secondary | ICD-10-CM | POA: Diagnosis not present

## 2020-05-22 DIAGNOSIS — Z7901 Long term (current) use of anticoagulants: Secondary | ICD-10-CM | POA: Diagnosis not present

## 2020-05-22 DIAGNOSIS — F419 Anxiety disorder, unspecified: Secondary | ICD-10-CM | POA: Diagnosis not present

## 2020-05-22 DIAGNOSIS — F32A Depression, unspecified: Secondary | ICD-10-CM | POA: Diagnosis not present

## 2020-05-26 ENCOUNTER — Other Ambulatory Visit: Payer: Self-pay | Admitting: Family Medicine

## 2020-05-26 DIAGNOSIS — J019 Acute sinusitis, unspecified: Secondary | ICD-10-CM

## 2020-05-27 DIAGNOSIS — M1990 Unspecified osteoarthritis, unspecified site: Secondary | ICD-10-CM | POA: Diagnosis not present

## 2020-05-27 DIAGNOSIS — M5416 Radiculopathy, lumbar region: Secondary | ICD-10-CM | POA: Diagnosis not present

## 2020-05-27 DIAGNOSIS — I10 Essential (primary) hypertension: Secondary | ICD-10-CM | POA: Diagnosis not present

## 2020-05-27 DIAGNOSIS — I872 Venous insufficiency (chronic) (peripheral): Secondary | ICD-10-CM | POA: Diagnosis not present

## 2020-05-27 DIAGNOSIS — N400 Enlarged prostate without lower urinary tract symptoms: Secondary | ICD-10-CM | POA: Diagnosis not present

## 2020-05-27 DIAGNOSIS — I2510 Atherosclerotic heart disease of native coronary artery without angina pectoris: Secondary | ICD-10-CM | POA: Diagnosis not present

## 2020-05-28 ENCOUNTER — Other Ambulatory Visit: Payer: Self-pay

## 2020-05-28 ENCOUNTER — Telehealth: Payer: Self-pay | Admitting: Family Medicine

## 2020-05-28 DIAGNOSIS — J019 Acute sinusitis, unspecified: Secondary | ICD-10-CM

## 2020-05-28 MED ORDER — DOXYCYCLINE HYCLATE 100 MG PO TABS: 100.0000 mg | ORAL_TABLET | Freq: Two times a day (BID) | ORAL | 0 refills | Status: DC

## 2020-05-28 NOTE — Telephone Encounter (Signed)
Patient is calling to get a refill on his Doxycycline 100mg . If approved, please send to pharmacy and call patient to let him know that it has been sent in.

## 2020-06-09 DIAGNOSIS — M79674 Pain in right toe(s): Secondary | ICD-10-CM | POA: Diagnosis not present

## 2020-06-09 DIAGNOSIS — M79675 Pain in left toe(s): Secondary | ICD-10-CM | POA: Diagnosis not present

## 2020-06-09 DIAGNOSIS — M2042 Other hammer toe(s) (acquired), left foot: Secondary | ICD-10-CM | POA: Diagnosis not present

## 2020-06-09 DIAGNOSIS — B351 Tinea unguium: Secondary | ICD-10-CM | POA: Diagnosis not present

## 2020-06-09 DIAGNOSIS — M2041 Other hammer toe(s) (acquired), right foot: Secondary | ICD-10-CM | POA: Diagnosis not present

## 2020-06-14 ENCOUNTER — Other Ambulatory Visit: Payer: Self-pay | Admitting: Family Medicine

## 2020-06-14 DIAGNOSIS — J301 Allergic rhinitis due to pollen: Secondary | ICD-10-CM

## 2020-06-15 ENCOUNTER — Telehealth: Payer: Self-pay

## 2020-06-15 NOTE — Telephone Encounter (Signed)
Received an after hours message stating that patients son is requesting refills on the following meds:  Cetrizine and Doxycyline.   Cetrizine was sent already today. Can he get the Doxycyline refilled?  please review and advise.   Thanks.    Dm/cma

## 2020-06-16 ENCOUNTER — Other Ambulatory Visit: Payer: Self-pay | Admitting: Family Medicine

## 2020-06-16 ENCOUNTER — Telehealth: Payer: Self-pay | Admitting: Family Medicine

## 2020-06-16 DIAGNOSIS — J019 Acute sinusitis, unspecified: Secondary | ICD-10-CM

## 2020-06-16 NOTE — Telephone Encounter (Signed)
Patient is calling and requesting a refill for doxycycline sent to [facility], please advise. CB is [phone]

## 2020-06-17 NOTE — Telephone Encounter (Signed)
Patient is calling back to check the status of previous message. Please advise.

## 2020-06-18 NOTE — Telephone Encounter (Signed)
RX sent yesterday by Dr Ethelene Hal

## 2020-06-27 ENCOUNTER — Other Ambulatory Visit: Payer: Self-pay | Admitting: Family Medicine

## 2020-06-27 DIAGNOSIS — K219 Gastro-esophageal reflux disease without esophagitis: Secondary | ICD-10-CM

## 2020-06-27 DIAGNOSIS — I480 Paroxysmal atrial fibrillation: Secondary | ICD-10-CM

## 2020-06-27 DIAGNOSIS — I10 Essential (primary) hypertension: Secondary | ICD-10-CM

## 2020-06-28 ENCOUNTER — Other Ambulatory Visit: Payer: Self-pay | Admitting: Family Medicine

## 2020-06-28 DIAGNOSIS — F419 Anxiety disorder, unspecified: Secondary | ICD-10-CM

## 2020-06-29 ENCOUNTER — Encounter: Payer: Self-pay | Admitting: Cardiovascular Disease

## 2020-06-29 ENCOUNTER — Ambulatory Visit (INDEPENDENT_AMBULATORY_CARE_PROVIDER_SITE_OTHER): Payer: Medicare Other | Admitting: Cardiovascular Disease

## 2020-06-29 VITALS — BP 120/63 | HR 56 | Temp 98.6°F | Ht 66.0 in

## 2020-06-29 DIAGNOSIS — I4819 Other persistent atrial fibrillation: Secondary | ICD-10-CM | POA: Diagnosis not present

## 2020-06-29 DIAGNOSIS — I2510 Atherosclerotic heart disease of native coronary artery without angina pectoris: Secondary | ICD-10-CM

## 2020-06-29 DIAGNOSIS — I483 Typical atrial flutter: Secondary | ICD-10-CM | POA: Diagnosis not present

## 2020-06-29 DIAGNOSIS — I10 Essential (primary) hypertension: Secondary | ICD-10-CM

## 2020-06-29 DIAGNOSIS — E66811 Obesity, class 1: Secondary | ICD-10-CM

## 2020-06-29 DIAGNOSIS — I6300 Cerebral infarction due to thrombosis of unspecified precerebral artery: Secondary | ICD-10-CM | POA: Diagnosis not present

## 2020-06-29 DIAGNOSIS — E669 Obesity, unspecified: Secondary | ICD-10-CM | POA: Diagnosis not present

## 2020-06-29 HISTORY — DX: Obesity, class 1: E66.811

## 2020-06-29 HISTORY — DX: Obesity, unspecified: E66.9

## 2020-06-29 NOTE — Patient Instructions (Addendum)
Medication Instructions:  Your physician recommends that you continue on your current medications as directed. Please refer to the Current Medication list given to you today.  *If you need a refill on your cardiac medications before your next appointment, please call your pharmacy*   Lab Work: NONE  If you have labs (blood work) drawn today and your tests are completely normal, you will receive your results only by: Marland Kitchen MyChart Message (if you have MyChart) OR . A paper copy in the mail If you have any lab test that is abnormal or we need to change your treatment, we will call you to review the results.   Testing/Procedures: NONE   Follow-Up: At [facility], you and your health needs are our priority.  As part of our continuing mission to provide you with exceptional heart care, we have created designated Provider Care Teams.  These Care Teams include your primary Cardiologist (physician) and Advanced Practice Providers (APPs -  Physician Assistants and Nurse Practitioners) who all work together to provide you with the care you need, when you need it.  We recommend signing up for the patient portal called "MyChart".  Sign up information is provided on this After Visit Summary.  MyChart is used to connect with patients for Virtual Visits (Telemedicine).  Patients are able to view lab/test results, encounter notes, upcoming appointments, etc.  Non-urgent messages can be sent to your provider as well.   To learn more about what you can do with MyChart, go to [email or web address].    Your next appointment:   6 month(s)  The format for your next appointment:   In Person  Provider:   You may see Skeet Latch, MD or one of the following Advanced Practice Providers on your designated Care Team:    Kerin Ransom, PA-C  [place], [place]  Coletta Memos, FNP  You have been referred to    Where: [facility] Address: [address]  [place] [address] Phone: [phone]  IF YOU DO NOT HEAR FROM THEM BY END OF NEXT WEEK CALL THEM DIRECTLY AT NUMBER LISTED

## 2020-06-29 NOTE — Progress Notes (Signed)
Cardiology Office Note   Date:  06/29/2020   ID:  Brian [name], Brian [name] 1933/01/24, MRN [id number]  PCP:  Libby Maw, MD  Cardiologist:   Skeet Latch, MD   No chief complaint on file.    History of Present Illness: Brian [name] is a 86 y.o. male with paroxysmal atrial fibrillation, GI bleeds, prior stroke, hypertension, prior mitral vale repair who presents for follow up.  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  [name]. INR was 2.8 on admission.  [name] was switched to Eliquis 2.5 mg bid prior to discharge.  Brian [name] had a gastric ulcer in 2002 and required transfusion at that time. This was subsequently treated and [name] has not had any recurrent GI bleeding.  Brian [name] had his mitral valve repaired at [facility] in 2002. Brian [name] had an echo 05/06/16 that revealed LVEF 55-60% and mildly elevated pulmonary pressures.  [name] had a [employer] 01/2004 that revealed LVEF 64% and a nonreversible inferior defect that was thought to be diaphragm attenuation versus infarct. [name] had an echo 10/2009 with normal systolic function, mild aortic regurgitation and mild mitral regurgitation.  [name] was last seen by Dr. Stanford Breed 01/2013 at which time [name] was felt to be in atrial flutter. [name] was well rate-controlled and was asymptomatic.   Since his last appoint Brian [name] has been feeling well.  [name] struggles with gaining weight.  [name] is mostly in his wheelchair throughout the day.  [name] has some decubitus ulcers that are painful.  [name] does try to use the [facility] exercise machine twice per week.  [name] is limited by pain in his knees and shoulders.  [name] reports that [name] mostly eats a healthy diet and does not eat very much.  [name] has oatmeal or Cheerios for breakfast.  [name] has fruit or Jell-O for snacks throughout the day.  For lunch and dinner [name] has a protein and a vegetable.  [name] really is trying to  limit his carbohydrates and does not understand why [name] cannot lose weight.  [name] denies any lower extremity edema, orthopnea, or PND.  [name] has not had any chest pain and his breathing is stable.  [name] lives with his son who helps take care of him.  [name] takes Tylenol for his pain but still has breakthrough pain.  [name] is got injections by orthopedics but they have not been very helpful.  [name] denies any palpitations, lightheadedness, or dizziness.  [name] denies any recurrent melena or hematochezia.  Past Medical History:  Diagnosis Date  . Anemia   . Atrial flutter ([place])   . BPH (benign prostatic hypertrophy)   . Epistaxis   . Hemorrhage of gastrointestinal tract, unspecified   . History of open heart surgery   . Hypertension   . Obesity (BMI 30.0-34.9) 06/29/2020  . Osteoarthrosis, unspecified whether generalized or localized, unspecified site   . Personal history of venous thrombosis and embolism   . Rosacea   . Stroke ([place])   . TIA (transient ischemic attack)     Past Surgical History:  Procedure Laterality Date  . APPENDECTOMY    . HERNIA REPAIR    . IR GENERIC HISTORICAL  05/04/2016   IR PERCUTANEOUS ART THROMBECTOMY/INFUSION INTRACRANIAL INC DIAG ANGIO 05/04/2016 [name], MD [facility]  . IR GENERIC HISTORICAL  05/04/2016   IR ANGIO VERTEBRAL SEL SUBCLAVIAN INNOMINATE UNI R  MOD SED 05/04/2016 [name], MD [facility]  . IR GENERIC HISTORICAL  06/07/2016   IR RADIOLOGIST EVAL & MGMT 06/07/2016 [facility]  . JOINT REPLACEMENT    . MITRAL VALVE REPAIR    . mvp repair    . RADIOLOGY WITH ANESTHESIA N/A 05/04/2016   Procedure: RADIOLOGY WITH ANESTHESIA;  Surgeon: [name], MD;  Location: [place];  Service: Radiology;  Laterality: N/A;  . TOTAL HIP ARTHROPLASTY       Current Outpatient Medications  Medication Sig Dispense Refill  . acetaminophen (TYLENOL) 500 MG tablet Take 500 mg by mouth every 6 (six) hours as needed.    . ALPRAZolam (XANAX) 0.25 MG tablet  TAKE 1 TABLET(0.25 MG) BY MOUTH AT BEDTIME 90 tablet 0  . cetirizine (ZYRTEC) 10 MG tablet TAKE 1 TABLET(10 MG) BY MOUTH AT BEDTIME 90 tablet 2  . chlorhexidine (PERIDEX) 0.12 % solution chlorhexidine gluconate 0.12 % mouthwash  SWISH GENTLY WITH 1/2 CAPFUL FOR 30 SECONDS TWICE DAILY AFTER EATING.    . clotrimazole (LOTRIMIN AF) 1 % cream Apply 1 application topically 2 (two) times daily. 30 g 0  . doxycycline (VIBRA-TABS) 100 MG tablet TAKE 1 TABLET(100 MG) BY MOUTH TWICE DAILY 20 tablet 0  . ELIQUIS 2.5 MG TABS tablet TAKE 1 TABLET(2.5 MG) BY MOUTH TWICE DAILY 180 tablet 1  . finasteride (PROSCAR) 5 MG tablet TAKE 1 TABLET(5 MG) BY MOUTH DAILY 90 tablet 3  . fluticasone (FLONASE) 50 MCG/ACT nasal spray fluticasone propionate 50 mcg/actuation nasal spray,suspension    . hydrocortisone (ANUCORT-HC) 25 MG suppository UNWRAP AND INSERT 1 SUPPOSITORY RECTALLY TWICE DAILY 30 suppository 10  . hydrocortisone (PROCTOZONE-HC) 2.5 % rectal cream USE RECTALLY TWICE DAILY 30 g 0  . hydrocortisone-pramoxine (ANALPRAM-HC) 2.5-1 % rectal cream Place 1 application rectally 2 (two) times daily as needed for hemorrhoids.     . lidocaine (LIDODERM) 5 % APPLY 1 PATCH TOPICALLY ONCE DAILY.MAY WEAR UPTO 12 HOURS MAXIMUM IN A DAY.    . metoprolol succinate (TOPROL-XL) 25 MG 24 hr tablet TAKE 1 TABLET(25 MG) BY MOUTH DAILY 90 tablet 3  . Multiple Vitamin (MULTIVITAMIN) tablet Take 2 tablets by mouth daily.    Marland Kitchen oxyCODONE-acetaminophen (PERCOCET/ROXICET) 5-325 MG tablet Take 1 tablet by mouth 2 (two) times daily. 180 tablet 0  . pantoprazole (PROTONIX) 40 MG tablet TAKE 1 TABLET(40 MG) BY MOUTH DAILY 90 tablet 0  . polyethylene glycol powder (GLYCOLAX/MIRALAX) 17 GM/SCOOP powder Mix one scoop with water and take daily until stools loosen. May repeat as needed. 850 g 1  . pravastatin (PRAVACHOL) 40 MG tablet TAKE 1 TABLET(40 MG) BY MOUTH DAILY 90 tablet 3  . sertraline (ZOLOFT) 50 MG tablet TAKE 1 TABLET(50 MG) BY MOUTH  EVERY MORNING 90 tablet 3  . Silver (SILVASORB) GEL Apply daily to wound to keep moist. 44.4 mL 0   No current facility-administered medications for this visit.    Allergies:   Novocain [procaine hcl], Other, and Penicillins    Social History:  The patient  reports that [name] has quit smoking. [name] has never used smokeless tobacco. [name] reports that [name] does not drink alcohol and does not use drugs.   Family History:  The patient's family history includes Coronary artery disease in his father; Rheumatic fever in his mother.    ROS:  Please see the history of present illness.   Otherwise, review of systems are positive for none.   All other systems are reviewed and negative.    PHYSICAL  EXAM: VS:  BP 120/63   Pulse (!) 56   Temp 98.6 F (37 C)   Ht 5\' 6"  (1.676 m)   SpO2 96%   BMI 30.51 kg/m  , BMI Body mass index is 30.51 kg/m. GENERAL:  Frail, elderly man in wheelchair.  No acute distress HEENT:  Pupils equal round and reactive, fundi not visualized, oral mucosa unremarkable NECK:  No jugular venous distention, waveform within normal limits, carotid upstroke brisk and symmetric, no bruits LUNGS:  Clear to auscultation bilaterally.  No crackles, rhonchi, or wheezes. HEART:  Irregularly irregular.  PMI not displaced or sustained,S1 and S2 within normal limits, no S3, no S4, no clicks, no rubs, III/VI holosystolic murmur at the apex ABD:  Flat, positive bowel sounds normal in frequency in pitch, no bruits, no rebound, no guarding, no midline pulsatile mass, no hepatomegaly, no splenomegaly EXT:  2 plus pulses throughout, no edema, no cyanosis no clubbing SKIN:  No rashes no nodules NEURO:  Cranial nerves II through XII grossly intact, motor grossly intact throughout PSYCH:  Cognitively intact, oriented to person place and time  EKG:  EKG is ordered today. 07/04/16:  Atrial fibrillation. Rate 76 bpm. Left anterior fascicular block. Left ventricular hypertrophy with repolarization  abnormalities. 04/23/18: Sinus rhythm.  Rate 86 bpm. LVH with repolarization abnormalities. 06/29/20: Atrial fibrillation.  Rate 56 bpm.  LAFB.  LVH.  Echo 05/06/16: Study Conclusions  - Left ventricle: The cavity size was normal. Wall thickness was   normal. Systolic function was normal. The estimated ejection   fraction was in the range of 55% to 60%. Wall motion was normal;   there were no regional wall motion abnormalities. Mitral   annuloplasty precludes evaluation of LV diastolic function. - Ventricular septum: Septal motion showed paradox. These changes   are consistent with a post-thoracotomy state. - Mitral valve: Prior procedures included surgical repair. An   annular ring prosthesis was present and functioning normally.   Valve area by pressure half-time: 2.47 cm^2. - Left atrium: The atrium was mildly dilated. - Pulmonary arteries: Systolic pressure was mildly increased. PA   peak pressure: 44 mm Hg (S).  Recent Labs: 11/06/2019: Hemoglobin 15.1; Platelets 241.0 05/11/2020: ALT 17; BUN 16; Creatinine, Ser 0.68; Potassium 4.6; Sodium 135    Lipid Panel    Component Value Date/Time   CHOL 134 04/16/2018 1155   TRIG 139 04/16/2018 1155   HDL 51 04/16/2018 1155   CHOLHDL 2.6 04/16/2018 1155   CHOLHDL 3.4 10/03/2016 1157   VLDL 22 10/03/2016 1157   LDLCALC 55 04/16/2018 1155   LDLDIRECT 68 02/06/2020 1706      Wt Readings from Last 3 Encounters:  05/11/20 189 lb (85.7 kg)  02/06/20 185 lb (83.9 kg)  12/22/19 175 lb (79.4 kg)      ASSESSMENT AND PLAN:  # Persistent atrial fibrillation:  # Prior embolic stroke/hemorrhagic: Mr. Cummiskey is back in atrial fibrillation.  [name] is asymptomatic.  Rates are well-controlled.  Continue Eliquis.  [name] has not had any recurrent bleeding.  Continue metoprolol.  His Eliquis is underdosed due to history of GI bleed, ICH, low weight (67kg) and age >73.  Rates are well-controlled on metoprolol. This patients CHA2DS2-VASc Score and  unadjusted Ischemic Stroke Rate (% per year) is equal to 7.2 % stroke rate/year from a score of 5 Above score calculated as 1 point each if present [CHF, HTN, DM, Vascular=MI/PAD/Aortic Plaque, Age if 65-74, or Male] Above score calculated as 2 points each if present [  Age > 13, or Stroke/TIA/TE]  # Hypertension: Blood pressure is well-controlled on metoprolol.  [name] is no longer on an ACE inhibitor.  # Mitral valve repair: Valve stable on echo 04/2016.  [name] is euvolemic and doing well.  # Hyperlipidemia: LDL 68 on 01/2020.  Continue pravastatin.   Current medicines are reviewed at length with the patient today.  The patient does not have concerns regarding medicines.  The following changes have been made:  no change  Labs/ tests ordered today include:   Orders Placed This Encounter  Procedures  . Ambulatory referral to Nutrition and Diabetic Education  . EKG 12-Lead    Disposition:   FU with Tionne Carelli C. Oval Linsey, MD, [facility] in 6 months    Signed, Mardene Lessig C. Oval Linsey, MD, [facility]  06/29/2020 5:10 PM    [facility]

## 2020-06-30 ENCOUNTER — Telehealth: Payer: Self-pay

## 2020-06-30 ENCOUNTER — Telehealth: Payer: Self-pay | Admitting: Family Medicine

## 2020-06-30 DIAGNOSIS — E7800 Pure hypercholesterolemia, unspecified: Secondary | ICD-10-CM

## 2020-06-30 NOTE — Telephone Encounter (Signed)
Patient states that the pharmacy did not receive his prescription for Alprazolam. Please resend and call patient to let him know that it has been done.  Thank you

## 2020-06-30 NOTE — Telephone Encounter (Signed)
Spoke with pharmacy they have received Rx patient aware to pick up.

## 2020-06-30 NOTE — Progress Notes (Addendum)
Chronic Care Management Pharmacy Assistant   Name: Brian [name]  MRN: [id number] DOB: Apr 03, 1933  Reason for Encounter: Medication Review/General Adherence Call.  PCP : Libby Maw, MD  Allergies:   Allergies  Allergen Reactions  . Novocain [Procaine Hcl]     Irregular heart beat     . Other     Patient had problem with epidural with his right hip surgery. Went to up extremity instead of lower extremity   . Penicillins Itching    Has patient had a PCN reaction causing immediate rash, facial/tongue/throat swelling, SOB or lightheadedness with hypotension: Unk Has patient had a PCN reaction causing severe rash involving mucus membranes or skin necrosis: Unk Has patient had a PCN reaction that required hospitalization: Unk Has patient had a PCN reaction occurring within the last 10 years: No If all of the above answers are "NO", then may proceed with Cephalosporin use.  No reaction noted    Medications: Outpatient Encounter Medications as of 06/30/2020  Medication Sig  . acetaminophen (TYLENOL) 500 MG tablet Take 500 mg by mouth every 6 (six) hours as needed.  . ALPRAZolam (XANAX) 0.25 MG tablet TAKE 1 TABLET(0.25 MG) BY MOUTH AT BEDTIME  . cetirizine (ZYRTEC) 10 MG tablet TAKE 1 TABLET(10 MG) BY MOUTH AT BEDTIME  . chlorhexidine (PERIDEX) 0.12 % solution chlorhexidine gluconate 0.12 % mouthwash  SWISH GENTLY WITH 1/2 CAPFUL FOR 30 SECONDS TWICE DAILY AFTER EATING.  . clotrimazole (LOTRIMIN AF) 1 % cream Apply 1 application topically 2 (two) times daily.  Marland Kitchen doxycycline (VIBRA-TABS) 100 MG tablet TAKE 1 TABLET(100 MG) BY MOUTH TWICE DAILY  . ELIQUIS 2.5 MG TABS tablet TAKE 1 TABLET(2.5 MG) BY MOUTH TWICE DAILY  . finasteride (PROSCAR) 5 MG tablet TAKE 1 TABLET(5 MG) BY MOUTH DAILY  . fluticasone (FLONASE) 50 MCG/ACT nasal spray fluticasone propionate 50 mcg/actuation nasal spray,suspension  . hydrocortisone (ANUCORT-HC) 25 MG suppository UNWRAP AND INSERT 1  SUPPOSITORY RECTALLY TWICE DAILY  . hydrocortisone (PROCTOZONE-HC) 2.5 % rectal cream USE RECTALLY TWICE DAILY  . hydrocortisone-pramoxine (ANALPRAM-HC) 2.5-1 % rectal cream Place 1 application rectally 2 (two) times daily as needed for hemorrhoids.   . lidocaine (LIDODERM) 5 % APPLY 1 PATCH TOPICALLY ONCE DAILY.MAY WEAR UPTO 12 HOURS MAXIMUM IN A DAY.  . metoprolol succinate (TOPROL-XL) 25 MG 24 hr tablet TAKE 1 TABLET(25 MG) BY MOUTH DAILY  . Multiple Vitamin (MULTIVITAMIN) tablet Take 2 tablets by mouth daily.  Marland Kitchen oxyCODONE-acetaminophen (PERCOCET/ROXICET) 5-325 MG tablet Take 1 tablet by mouth 2 (two) times daily.  . pantoprazole (PROTONIX) 40 MG tablet TAKE 1 TABLET(40 MG) BY MOUTH DAILY  . polyethylene glycol powder (GLYCOLAX/MIRALAX) 17 GM/SCOOP powder Mix one scoop with water and take daily until stools loosen. May repeat as needed.  . pravastatin (PRAVACHOL) 40 MG tablet TAKE 1 TABLET(40 MG) BY MOUTH DAILY  . sertraline (ZOLOFT) 50 MG tablet TAKE 1 TABLET(50 MG) BY MOUTH EVERY MORNING  . Silver (SILVASORB) GEL Apply daily to wound to keep moist.   No facility-administered encounter medications on file as of 06/30/2020.    Current Diagnosis: Patient Active Problem List   Diagnosis Date Noted  . Obesity (BMI 30.0-34.9) 06/29/2020  . Acute non-recurrent sinusitis 05/11/2020  . Need for influenza vaccination 05/11/2020  . Paroxysmal atrial fibrillation ([place]) 05/11/2020  . Benign prostatic hyperplasia with urinary hesitancy 05/11/2020  . Venous ulcer, limited to breakdown of skin ([place]) 02/06/2020  . Swelling of lower extremity 02/06/2020  . Left ear pain 05/20/2019  .  Pressure injury of buttock, stage 1 05/20/2019  . Debilitated 05/20/2019  . Elevated glucose 04/08/2019  . Serum potassium elevated 03/18/2019  . Elevated cholesterol 09/24/2018  . Gait disturbance 09/24/2018  . Anxiety 04/30/2018  . Lumbar radiculopathy 02/14/2018  . Pain in joint of left shoulder 12/28/2017  .  Osteoarthritis of left glenohumeral joint 12/28/2017  . Pain in left foot 10/23/2017  . Coronary artery disease involving native coronary artery of native heart without angina pectoris   . Persistent atrial fibrillation ([place])   . Dysarthria, post-stroke   . Dysphagia, post-stroke   . Cerebrovascular accident (CVA) due to thrombosis of precerebral artery ([place])   . Scoliosis (and kyphoscoliosis), idiopathic 01/26/2014  . Arthralgia of hip 02/07/2012  . Osteoarthritis of hip 02/07/2012  . Sacroiliitis ([place]) 02/07/2012  . ROTATOR CUFF SYNDROME, LEFT 03/07/2010  . OTH MALIG NEOPLASM SKIN OTH&UNSPEC PARTS FACE 12/06/2009  . ACTINIC KERATOSIS, HEAD 09/06/2009  . Osteoarthritis 02/20/2009  . History of mitral valve repair 02/20/2009  . HIP REPLACEMENT, TOTAL, HX OF 02/20/2009  . APPENDECTOMY, HX OF 02/20/2009  . HERNIORRHAPHY, HX OF 02/20/2009  . EPISTAXIS, RECURRENT 11/19/2008  . UNS ADVRS EFF UNS RX MEDICINAL&BIOLOGICAL SBSTNC 06/12/2008  . OTHER AND UNSPECIFIED MITRAL VALVE DISEASES 03/12/2008  . ATRIAL FLUTTER 03/12/2008  . Essential hypertension 03/06/2007  . PEPTIC ULCER DISEASE 03/06/2007  . BENIGN PROSTATIC HYPERTROPHY 03/06/2007  . Coronary atherosclerosis 02/13/2007  . ROSACEA 02/13/2007  . DVT, HX OF 02/13/2007   Follow-Up:  Pharmacist Review   Called patient and discussed medication adherence  with patient, no issues at this time with current medication.   Patient denies ED visit since his last CPP follow up.  Patient denies any side effects with his medication. Patient denies any problems with his current pharmacy  Patient states he has been having issue with arthritis.Patient reports he takes Tylenol and Oxycodone to help with relief.   [place] Pharmacist Assistant [phone]

## 2020-07-03 DIAGNOSIS — I10 Essential (primary) hypertension: Secondary | ICD-10-CM | POA: Diagnosis not present

## 2020-07-03 DIAGNOSIS — F419 Anxiety disorder, unspecified: Secondary | ICD-10-CM | POA: Diagnosis not present

## 2020-07-03 DIAGNOSIS — F32A Depression, unspecified: Secondary | ICD-10-CM | POA: Diagnosis not present

## 2020-07-03 DIAGNOSIS — Z7901 Long term (current) use of anticoagulants: Secondary | ICD-10-CM | POA: Diagnosis not present

## 2020-07-03 DIAGNOSIS — G894 Chronic pain syndrome: Secondary | ICD-10-CM | POA: Diagnosis not present

## 2020-07-07 ENCOUNTER — Other Ambulatory Visit: Payer: Self-pay | Admitting: Family Medicine

## 2020-07-07 DIAGNOSIS — J019 Acute sinusitis, unspecified: Secondary | ICD-10-CM

## 2020-07-08 NOTE — Telephone Encounter (Signed)
Last OV 05/11/20 Last fill 06/17/20  #20/0

## 2020-07-09 DIAGNOSIS — Z741 Need for assistance with personal care: Secondary | ICD-10-CM | POA: Diagnosis not present

## 2020-07-09 DIAGNOSIS — Z87891 Personal history of nicotine dependence: Secondary | ICD-10-CM | POA: Diagnosis not present

## 2020-07-09 DIAGNOSIS — M6281 Muscle weakness (generalized): Secondary | ICD-10-CM | POA: Diagnosis not present

## 2020-07-09 DIAGNOSIS — M170 Bilateral primary osteoarthritis of knee: Secondary | ICD-10-CM | POA: Diagnosis not present

## 2020-07-09 DIAGNOSIS — F419 Anxiety disorder, unspecified: Secondary | ICD-10-CM | POA: Diagnosis not present

## 2020-07-09 DIAGNOSIS — M25552 Pain in left hip: Secondary | ICD-10-CM | POA: Diagnosis not present

## 2020-07-09 DIAGNOSIS — M19012 Primary osteoarthritis, left shoulder: Secondary | ICD-10-CM | POA: Diagnosis not present

## 2020-07-09 DIAGNOSIS — M25512 Pain in left shoulder: Secondary | ICD-10-CM | POA: Diagnosis not present

## 2020-07-09 DIAGNOSIS — G8929 Other chronic pain: Secondary | ICD-10-CM | POA: Diagnosis not present

## 2020-07-09 DIAGNOSIS — Z8673 Personal history of transient ischemic attack (TIA), and cerebral infarction without residual deficits: Secondary | ICD-10-CM | POA: Diagnosis not present

## 2020-07-09 DIAGNOSIS — M79672 Pain in left foot: Secondary | ICD-10-CM | POA: Diagnosis not present

## 2020-07-09 DIAGNOSIS — Z7901 Long term (current) use of anticoagulants: Secondary | ICD-10-CM | POA: Diagnosis not present

## 2020-07-09 DIAGNOSIS — M5416 Radiculopathy, lumbar region: Secondary | ICD-10-CM | POA: Diagnosis not present

## 2020-07-09 DIAGNOSIS — M069 Rheumatoid arthritis, unspecified: Secondary | ICD-10-CM | POA: Diagnosis not present

## 2020-07-09 DIAGNOSIS — M5136 Other intervertebral disc degeneration, lumbar region: Secondary | ICD-10-CM | POA: Diagnosis not present

## 2020-07-09 DIAGNOSIS — Z9181 History of falling: Secondary | ICD-10-CM | POA: Diagnosis not present

## 2020-07-13 ENCOUNTER — Other Ambulatory Visit: Payer: Self-pay | Admitting: Family Medicine

## 2020-07-13 DIAGNOSIS — J019 Acute sinusitis, unspecified: Secondary | ICD-10-CM

## 2020-07-13 NOTE — Telephone Encounter (Signed)
Please see message and advise.  Thank you. Last OV 05/11/20 Last fill 06/17/20  #20/0

## 2020-07-13 NOTE — Telephone Encounter (Signed)
Brian [name] is calling in regards to patient. He states that patient is completely out of his Doxycycline and needs it as soon as possible. If approved, please send to [facility] in [place] and call him at [phone] to let him know that it has been sent in.

## 2020-07-13 NOTE — Telephone Encounter (Signed)
Please advise 

## 2020-07-14 MED ORDER — DOXYCYCLINE HYCLATE 100 MG PO TABS: ORAL_TABLET | ORAL | 0 refills | Status: DC

## 2020-07-16 DIAGNOSIS — M5136 Other intervertebral disc degeneration, lumbar region: Secondary | ICD-10-CM | POA: Diagnosis not present

## 2020-07-16 DIAGNOSIS — M5416 Radiculopathy, lumbar region: Secondary | ICD-10-CM | POA: Diagnosis not present

## 2020-07-16 DIAGNOSIS — G8929 Other chronic pain: Secondary | ICD-10-CM | POA: Diagnosis not present

## 2020-07-16 DIAGNOSIS — F419 Anxiety disorder, unspecified: Secondary | ICD-10-CM | POA: Diagnosis not present

## 2020-07-16 DIAGNOSIS — M6281 Muscle weakness (generalized): Secondary | ICD-10-CM | POA: Diagnosis not present

## 2020-07-16 DIAGNOSIS — M25552 Pain in left hip: Secondary | ICD-10-CM | POA: Diagnosis not present

## 2020-07-20 DIAGNOSIS — M170 Bilateral primary osteoarthritis of knee: Secondary | ICD-10-CM | POA: Diagnosis not present

## 2020-07-21 DIAGNOSIS — M6281 Muscle weakness (generalized): Secondary | ICD-10-CM | POA: Diagnosis not present

## 2020-07-21 DIAGNOSIS — M5416 Radiculopathy, lumbar region: Secondary | ICD-10-CM | POA: Diagnosis not present

## 2020-07-21 DIAGNOSIS — F419 Anxiety disorder, unspecified: Secondary | ICD-10-CM | POA: Diagnosis not present

## 2020-07-21 DIAGNOSIS — M5136 Other intervertebral disc degeneration, lumbar region: Secondary | ICD-10-CM | POA: Diagnosis not present

## 2020-07-21 DIAGNOSIS — G8929 Other chronic pain: Secondary | ICD-10-CM | POA: Diagnosis not present

## 2020-07-21 DIAGNOSIS — M25552 Pain in left hip: Secondary | ICD-10-CM | POA: Diagnosis not present

## 2020-07-22 DIAGNOSIS — G8929 Other chronic pain: Secondary | ICD-10-CM | POA: Diagnosis not present

## 2020-07-22 DIAGNOSIS — M6281 Muscle weakness (generalized): Secondary | ICD-10-CM | POA: Diagnosis not present

## 2020-07-22 DIAGNOSIS — M5136 Other intervertebral disc degeneration, lumbar region: Secondary | ICD-10-CM | POA: Diagnosis not present

## 2020-07-22 DIAGNOSIS — F419 Anxiety disorder, unspecified: Secondary | ICD-10-CM | POA: Diagnosis not present

## 2020-07-22 DIAGNOSIS — M25552 Pain in left hip: Secondary | ICD-10-CM | POA: Diagnosis not present

## 2020-07-22 DIAGNOSIS — M5416 Radiculopathy, lumbar region: Secondary | ICD-10-CM | POA: Diagnosis not present

## 2020-07-23 DIAGNOSIS — M6281 Muscle weakness (generalized): Secondary | ICD-10-CM | POA: Diagnosis not present

## 2020-07-23 DIAGNOSIS — G8929 Other chronic pain: Secondary | ICD-10-CM | POA: Diagnosis not present

## 2020-07-23 DIAGNOSIS — F419 Anxiety disorder, unspecified: Secondary | ICD-10-CM | POA: Diagnosis not present

## 2020-07-23 DIAGNOSIS — M25552 Pain in left hip: Secondary | ICD-10-CM | POA: Diagnosis not present

## 2020-07-23 DIAGNOSIS — M5416 Radiculopathy, lumbar region: Secondary | ICD-10-CM | POA: Diagnosis not present

## 2020-07-23 DIAGNOSIS — M5136 Other intervertebral disc degeneration, lumbar region: Secondary | ICD-10-CM | POA: Diagnosis not present

## 2020-07-26 ENCOUNTER — Other Ambulatory Visit: Payer: Self-pay

## 2020-07-26 DIAGNOSIS — M79672 Pain in left foot: Secondary | ICD-10-CM

## 2020-07-26 DIAGNOSIS — M25559 Pain in unspecified hip: Secondary | ICD-10-CM

## 2020-07-26 DIAGNOSIS — M169 Osteoarthritis of hip, unspecified: Secondary | ICD-10-CM

## 2020-07-26 DIAGNOSIS — M19012 Primary osteoarthritis, left shoulder: Secondary | ICD-10-CM

## 2020-07-26 NOTE — Telephone Encounter (Signed)
  LAST APPOINTMENT DATE: 05/11/2020  NEXT APPOINTMENT DATE:@1 /18/2022  MEDICATION: oxyCODONE-acetaminophen (PERCOCET/ROXICET) 5-325 MG tablet  PHARMACY: [place], [place] - [address] AT [place]: Patient is completely out of this prescription, asked if this could be sent in as soon as possible.   Please advise

## 2020-07-26 NOTE — Telephone Encounter (Signed)
Refill request for pending medication, please advise message below

## 2020-07-27 ENCOUNTER — Telehealth: Payer: Self-pay

## 2020-07-27 DIAGNOSIS — M25511 Pain in right shoulder: Secondary | ICD-10-CM | POA: Diagnosis not present

## 2020-07-27 DIAGNOSIS — M25512 Pain in left shoulder: Secondary | ICD-10-CM | POA: Diagnosis not present

## 2020-07-27 DIAGNOSIS — M19012 Primary osteoarthritis, left shoulder: Secondary | ICD-10-CM | POA: Diagnosis not present

## 2020-07-27 MED ORDER — OXYCODONE-ACETAMINOPHEN 5-325 MG PO TABS: 1.0000 | ORAL_TABLET | Freq: Two times a day (BID) | ORAL | 0 refills | Status: DC | PRN

## 2020-07-27 NOTE — Telephone Encounter (Signed)
Please see message and advise.  Thank you. Last OV 05/11/20 Last fill 04/14/20 #180/0

## 2020-07-28 NOTE — Telephone Encounter (Signed)
Patient aware Rx sent in  

## 2020-07-29 DIAGNOSIS — F419 Anxiety disorder, unspecified: Secondary | ICD-10-CM | POA: Diagnosis not present

## 2020-07-29 DIAGNOSIS — M25552 Pain in left hip: Secondary | ICD-10-CM | POA: Diagnosis not present

## 2020-07-29 DIAGNOSIS — G8929 Other chronic pain: Secondary | ICD-10-CM | POA: Diagnosis not present

## 2020-07-29 DIAGNOSIS — M6281 Muscle weakness (generalized): Secondary | ICD-10-CM | POA: Diagnosis not present

## 2020-07-29 DIAGNOSIS — M5136 Other intervertebral disc degeneration, lumbar region: Secondary | ICD-10-CM | POA: Diagnosis not present

## 2020-07-29 DIAGNOSIS — M5416 Radiculopathy, lumbar region: Secondary | ICD-10-CM | POA: Diagnosis not present

## 2020-07-30 ENCOUNTER — Telehealth: Payer: Self-pay | Admitting: Cardiovascular Disease

## 2020-07-30 ENCOUNTER — Telehealth: Payer: Self-pay | Admitting: Family Medicine

## 2020-07-30 DIAGNOSIS — Z5181 Encounter for therapeutic drug level monitoring: Secondary | ICD-10-CM

## 2020-07-30 DIAGNOSIS — I10 Essential (primary) hypertension: Secondary | ICD-10-CM

## 2020-07-30 DIAGNOSIS — M6281 Muscle weakness (generalized): Secondary | ICD-10-CM | POA: Diagnosis not present

## 2020-07-30 DIAGNOSIS — M25552 Pain in left hip: Secondary | ICD-10-CM | POA: Diagnosis not present

## 2020-07-30 DIAGNOSIS — M5136 Other intervertebral disc degeneration, lumbar region: Secondary | ICD-10-CM | POA: Diagnosis not present

## 2020-07-30 DIAGNOSIS — G8929 Other chronic pain: Secondary | ICD-10-CM | POA: Diagnosis not present

## 2020-07-30 DIAGNOSIS — M5416 Radiculopathy, lumbar region: Secondary | ICD-10-CM | POA: Diagnosis not present

## 2020-07-30 DIAGNOSIS — F419 Anxiety disorder, unspecified: Secondary | ICD-10-CM | POA: Diagnosis not present

## 2020-07-30 NOTE — Telephone Encounter (Signed)
Patient is calling in regards to his blood pressure. He states that it is unusually high. Please give him a call back at [phone].

## 2020-07-30 NOTE — Telephone Encounter (Signed)
Spoke to patient he stated his B/P was elevated this morning 170/80.Stated PT would not do any exercises this morning.Stated at present B/P 140/90 pulse 75.Stated his B/P monitor is a wrist monitor.Advised wrist monitor's are not accurate.Advised I will leave a new B/P arm monitor at front desk.I will send message to [place] for advice.

## 2020-07-30 NOTE — Telephone Encounter (Signed)
Pt c/o BP issue: STAT if pt c/o blurred vision, one-sided weakness or slurred speech  1. What are your last 5 BP readings?  170/90 11 am  145/90 now  2. Are you having any other symptoms (ex. Dizziness, headache, blurred vision, passed out)? Sweating   3. What is your BP issue? Brian [name] is calling stating he had his BP taken this morning and they advised him to report this reading to Dr. Oval Linsey to see what she advises. Please advise.

## 2020-07-30 NOTE — Telephone Encounter (Signed)
I spoke with pt and he stated that  his Bp was 170/80 around 2pm today and he retook it right before this phone call and it was 140/90.  Pt informed me that he had no SOB, dizziness, blurred vision or chest pain.  Pt also stated that he has not taken his medications as of yet today.  Pt is currently on Metaprolol succinat 25mg .  I informed pt to keep an eye on Bp but not to take it without resting at least 5 mins before hand and to call his cardiologist to inform them as well.  If any changes or worsening Bp to call office back or head a an UC.

## 2020-08-02 NOTE — Telephone Encounter (Signed)
Let's start losartan 25mg  daily.  Keep checking BP.  BMP in a week and follow up in 1-2 months.  Thank you so much for the Christmas treats!

## 2020-08-03 DIAGNOSIS — M5416 Radiculopathy, lumbar region: Secondary | ICD-10-CM | POA: Diagnosis not present

## 2020-08-03 DIAGNOSIS — M5136 Other intervertebral disc degeneration, lumbar region: Secondary | ICD-10-CM | POA: Diagnosis not present

## 2020-08-03 DIAGNOSIS — Z7901 Long term (current) use of anticoagulants: Secondary | ICD-10-CM | POA: Diagnosis not present

## 2020-08-03 DIAGNOSIS — G8929 Other chronic pain: Secondary | ICD-10-CM | POA: Diagnosis not present

## 2020-08-03 DIAGNOSIS — I10 Essential (primary) hypertension: Secondary | ICD-10-CM | POA: Diagnosis not present

## 2020-08-03 DIAGNOSIS — M6281 Muscle weakness (generalized): Secondary | ICD-10-CM | POA: Diagnosis not present

## 2020-08-03 DIAGNOSIS — G894 Chronic pain syndrome: Secondary | ICD-10-CM | POA: Diagnosis not present

## 2020-08-03 DIAGNOSIS — I4891 Unspecified atrial fibrillation: Secondary | ICD-10-CM | POA: Diagnosis not present

## 2020-08-03 DIAGNOSIS — F419 Anxiety disorder, unspecified: Secondary | ICD-10-CM | POA: Diagnosis not present

## 2020-08-03 DIAGNOSIS — M25552 Pain in left hip: Secondary | ICD-10-CM | POA: Diagnosis not present

## 2020-08-04 NOTE — Telephone Encounter (Signed)
Left message to call back  

## 2020-08-05 DIAGNOSIS — M25552 Pain in left hip: Secondary | ICD-10-CM | POA: Diagnosis not present

## 2020-08-05 DIAGNOSIS — M6281 Muscle weakness (generalized): Secondary | ICD-10-CM | POA: Diagnosis not present

## 2020-08-05 DIAGNOSIS — G8929 Other chronic pain: Secondary | ICD-10-CM | POA: Diagnosis not present

## 2020-08-05 DIAGNOSIS — M5416 Radiculopathy, lumbar region: Secondary | ICD-10-CM | POA: Diagnosis not present

## 2020-08-05 DIAGNOSIS — F419 Anxiety disorder, unspecified: Secondary | ICD-10-CM | POA: Diagnosis not present

## 2020-08-05 DIAGNOSIS — M5136 Other intervertebral disc degeneration, lumbar region: Secondary | ICD-10-CM | POA: Diagnosis not present

## 2020-08-06 DIAGNOSIS — M25552 Pain in left hip: Secondary | ICD-10-CM | POA: Diagnosis not present

## 2020-08-06 DIAGNOSIS — M6281 Muscle weakness (generalized): Secondary | ICD-10-CM | POA: Diagnosis not present

## 2020-08-06 DIAGNOSIS — F419 Anxiety disorder, unspecified: Secondary | ICD-10-CM | POA: Diagnosis not present

## 2020-08-06 DIAGNOSIS — M5136 Other intervertebral disc degeneration, lumbar region: Secondary | ICD-10-CM | POA: Diagnosis not present

## 2020-08-06 DIAGNOSIS — M5416 Radiculopathy, lumbar region: Secondary | ICD-10-CM | POA: Diagnosis not present

## 2020-08-06 DIAGNOSIS — G8929 Other chronic pain: Secondary | ICD-10-CM | POA: Diagnosis not present

## 2020-08-08 DIAGNOSIS — M6281 Muscle weakness (generalized): Secondary | ICD-10-CM | POA: Diagnosis not present

## 2020-08-08 DIAGNOSIS — Z7901 Long term (current) use of anticoagulants: Secondary | ICD-10-CM | POA: Diagnosis not present

## 2020-08-08 DIAGNOSIS — G8929 Other chronic pain: Secondary | ICD-10-CM | POA: Diagnosis not present

## 2020-08-08 DIAGNOSIS — M170 Bilateral primary osteoarthritis of knee: Secondary | ICD-10-CM | POA: Diagnosis not present

## 2020-08-08 DIAGNOSIS — M5136 Other intervertebral disc degeneration, lumbar region: Secondary | ICD-10-CM | POA: Diagnosis not present

## 2020-08-08 DIAGNOSIS — M069 Rheumatoid arthritis, unspecified: Secondary | ICD-10-CM | POA: Diagnosis not present

## 2020-08-08 DIAGNOSIS — M19012 Primary osteoarthritis, left shoulder: Secondary | ICD-10-CM | POA: Diagnosis not present

## 2020-08-08 DIAGNOSIS — Z9181 History of falling: Secondary | ICD-10-CM | POA: Diagnosis not present

## 2020-08-08 DIAGNOSIS — M25512 Pain in left shoulder: Secondary | ICD-10-CM | POA: Diagnosis not present

## 2020-08-08 DIAGNOSIS — Z8673 Personal history of transient ischemic attack (TIA), and cerebral infarction without residual deficits: Secondary | ICD-10-CM | POA: Diagnosis not present

## 2020-08-08 DIAGNOSIS — F419 Anxiety disorder, unspecified: Secondary | ICD-10-CM | POA: Diagnosis not present

## 2020-08-08 DIAGNOSIS — M79672 Pain in left foot: Secondary | ICD-10-CM | POA: Diagnosis not present

## 2020-08-08 DIAGNOSIS — Z741 Need for assistance with personal care: Secondary | ICD-10-CM | POA: Diagnosis not present

## 2020-08-08 DIAGNOSIS — Z87891 Personal history of nicotine dependence: Secondary | ICD-10-CM | POA: Diagnosis not present

## 2020-08-08 DIAGNOSIS — M25552 Pain in left hip: Secondary | ICD-10-CM | POA: Diagnosis not present

## 2020-08-08 DIAGNOSIS — M5416 Radiculopathy, lumbar region: Secondary | ICD-10-CM | POA: Diagnosis not present

## 2020-08-10 ENCOUNTER — Other Ambulatory Visit: Payer: Self-pay | Admitting: Family Medicine

## 2020-08-10 DIAGNOSIS — M25552 Pain in left hip: Secondary | ICD-10-CM | POA: Diagnosis not present

## 2020-08-10 DIAGNOSIS — F419 Anxiety disorder, unspecified: Secondary | ICD-10-CM | POA: Diagnosis not present

## 2020-08-10 DIAGNOSIS — G8929 Other chronic pain: Secondary | ICD-10-CM | POA: Diagnosis not present

## 2020-08-10 DIAGNOSIS — M5136 Other intervertebral disc degeneration, lumbar region: Secondary | ICD-10-CM | POA: Diagnosis not present

## 2020-08-10 DIAGNOSIS — J019 Acute sinusitis, unspecified: Secondary | ICD-10-CM

## 2020-08-10 DIAGNOSIS — M6281 Muscle weakness (generalized): Secondary | ICD-10-CM | POA: Diagnosis not present

## 2020-08-10 DIAGNOSIS — M5416 Radiculopathy, lumbar region: Secondary | ICD-10-CM | POA: Diagnosis not present

## 2020-08-10 MED ORDER — LOSARTAN POTASSIUM 25 MG PO TABS: 25.0000 mg | ORAL_TABLET | Freq: Every day | ORAL | 3 refills | Status: DC

## 2020-08-10 NOTE — Telephone Encounter (Signed)
Advised patient, verbalized understanding Scheduled follow up visit and sent Rx to pharmacy

## 2020-08-11 ENCOUNTER — Telehealth: Payer: Self-pay

## 2020-08-11 NOTE — Telephone Encounter (Signed)
Last OV 05/27/20 Last fill 07/14/20  #20/0

## 2020-08-12 DIAGNOSIS — F419 Anxiety disorder, unspecified: Secondary | ICD-10-CM | POA: Diagnosis not present

## 2020-08-12 DIAGNOSIS — M6281 Muscle weakness (generalized): Secondary | ICD-10-CM | POA: Diagnosis not present

## 2020-08-12 DIAGNOSIS — M5416 Radiculopathy, lumbar region: Secondary | ICD-10-CM | POA: Diagnosis not present

## 2020-08-12 DIAGNOSIS — M25552 Pain in left hip: Secondary | ICD-10-CM | POA: Diagnosis not present

## 2020-08-12 DIAGNOSIS — G8929 Other chronic pain: Secondary | ICD-10-CM | POA: Diagnosis not present

## 2020-08-12 DIAGNOSIS — M5136 Other intervertebral disc degeneration, lumbar region: Secondary | ICD-10-CM | POA: Diagnosis not present

## 2020-08-17 DIAGNOSIS — M5416 Radiculopathy, lumbar region: Secondary | ICD-10-CM | POA: Diagnosis not present

## 2020-08-17 DIAGNOSIS — F419 Anxiety disorder, unspecified: Secondary | ICD-10-CM | POA: Diagnosis not present

## 2020-08-17 DIAGNOSIS — G8929 Other chronic pain: Secondary | ICD-10-CM | POA: Diagnosis not present

## 2020-08-17 DIAGNOSIS — M6281 Muscle weakness (generalized): Secondary | ICD-10-CM | POA: Diagnosis not present

## 2020-08-17 DIAGNOSIS — M25552 Pain in left hip: Secondary | ICD-10-CM | POA: Diagnosis not present

## 2020-08-17 DIAGNOSIS — M5136 Other intervertebral disc degeneration, lumbar region: Secondary | ICD-10-CM | POA: Diagnosis not present

## 2020-08-19 ENCOUNTER — Other Ambulatory Visit: Payer: Self-pay | Admitting: Family

## 2020-08-19 DIAGNOSIS — J019 Acute sinusitis, unspecified: Secondary | ICD-10-CM

## 2020-08-22 ENCOUNTER — Other Ambulatory Visit: Payer: Self-pay | Admitting: Family Medicine

## 2020-08-22 DIAGNOSIS — I6300 Cerebral infarction due to thrombosis of unspecified precerebral artery: Secondary | ICD-10-CM

## 2020-08-22 DIAGNOSIS — I480 Paroxysmal atrial fibrillation: Secondary | ICD-10-CM

## 2020-08-23 ENCOUNTER — Telehealth: Payer: Self-pay

## 2020-08-23 ENCOUNTER — Other Ambulatory Visit: Payer: Self-pay | Admitting: Family Medicine

## 2020-08-23 DIAGNOSIS — J019 Acute sinusitis, unspecified: Secondary | ICD-10-CM

## 2020-08-23 NOTE — Telephone Encounter (Signed)
Pt"s son is calling to get a refill for Doxycycline 100mg .  Please advise.  He would like a call once it is placed.

## 2020-08-24 ENCOUNTER — Other Ambulatory Visit: Payer: Self-pay | Admitting: Family Medicine

## 2020-08-24 DIAGNOSIS — J019 Acute sinusitis, unspecified: Secondary | ICD-10-CM

## 2020-08-24 NOTE — Telephone Encounter (Signed)
Refill request for pending medication, last refill 08/11/20, last OV 05/27/20. Please advise.

## 2020-08-25 NOTE — Telephone Encounter (Signed)
Pt's daughter calling to check on refill request.  Please see message and advise.

## 2020-08-25 NOTE — Telephone Encounter (Signed)
Refill is not appropriate. Pt needs to be seen

## 2020-08-25 NOTE — Telephone Encounter (Signed)
Patient is scheduled   

## 2020-08-26 DIAGNOSIS — M5136 Other intervertebral disc degeneration, lumbar region: Secondary | ICD-10-CM | POA: Diagnosis not present

## 2020-08-26 DIAGNOSIS — M5416 Radiculopathy, lumbar region: Secondary | ICD-10-CM | POA: Diagnosis not present

## 2020-08-26 DIAGNOSIS — F419 Anxiety disorder, unspecified: Secondary | ICD-10-CM | POA: Diagnosis not present

## 2020-08-26 DIAGNOSIS — M25552 Pain in left hip: Secondary | ICD-10-CM | POA: Diagnosis not present

## 2020-08-26 DIAGNOSIS — M6281 Muscle weakness (generalized): Secondary | ICD-10-CM | POA: Diagnosis not present

## 2020-08-26 DIAGNOSIS — G8929 Other chronic pain: Secondary | ICD-10-CM | POA: Diagnosis not present

## 2020-08-27 DIAGNOSIS — M25552 Pain in left hip: Secondary | ICD-10-CM | POA: Diagnosis not present

## 2020-08-27 DIAGNOSIS — M5136 Other intervertebral disc degeneration, lumbar region: Secondary | ICD-10-CM | POA: Diagnosis not present

## 2020-08-27 DIAGNOSIS — F419 Anxiety disorder, unspecified: Secondary | ICD-10-CM | POA: Diagnosis not present

## 2020-08-27 DIAGNOSIS — M6281 Muscle weakness (generalized): Secondary | ICD-10-CM | POA: Diagnosis not present

## 2020-08-27 DIAGNOSIS — M5416 Radiculopathy, lumbar region: Secondary | ICD-10-CM | POA: Diagnosis not present

## 2020-08-27 DIAGNOSIS — G8929 Other chronic pain: Secondary | ICD-10-CM | POA: Diagnosis not present

## 2020-09-01 ENCOUNTER — Encounter: Payer: Self-pay | Admitting: Family Medicine

## 2020-09-02 DIAGNOSIS — F419 Anxiety disorder, unspecified: Secondary | ICD-10-CM | POA: Diagnosis not present

## 2020-09-02 DIAGNOSIS — M6281 Muscle weakness (generalized): Secondary | ICD-10-CM | POA: Diagnosis not present

## 2020-09-02 DIAGNOSIS — Z7901 Long term (current) use of anticoagulants: Secondary | ICD-10-CM | POA: Diagnosis not present

## 2020-09-02 DIAGNOSIS — M5136 Other intervertebral disc degeneration, lumbar region: Secondary | ICD-10-CM | POA: Diagnosis not present

## 2020-09-02 DIAGNOSIS — M25552 Pain in left hip: Secondary | ICD-10-CM | POA: Diagnosis not present

## 2020-09-02 DIAGNOSIS — G8929 Other chronic pain: Secondary | ICD-10-CM | POA: Diagnosis not present

## 2020-09-02 DIAGNOSIS — Z7401 Bed confinement status: Secondary | ICD-10-CM | POA: Diagnosis not present

## 2020-09-02 DIAGNOSIS — M5416 Radiculopathy, lumbar region: Secondary | ICD-10-CM | POA: Diagnosis not present

## 2020-09-02 DIAGNOSIS — I10 Essential (primary) hypertension: Secondary | ICD-10-CM | POA: Diagnosis not present

## 2020-09-03 DIAGNOSIS — M5136 Other intervertebral disc degeneration, lumbar region: Secondary | ICD-10-CM | POA: Diagnosis not present

## 2020-09-03 DIAGNOSIS — G8929 Other chronic pain: Secondary | ICD-10-CM | POA: Diagnosis not present

## 2020-09-03 DIAGNOSIS — F419 Anxiety disorder, unspecified: Secondary | ICD-10-CM | POA: Diagnosis not present

## 2020-09-03 DIAGNOSIS — M25552 Pain in left hip: Secondary | ICD-10-CM | POA: Diagnosis not present

## 2020-09-03 DIAGNOSIS — M6281 Muscle weakness (generalized): Secondary | ICD-10-CM | POA: Diagnosis not present

## 2020-09-03 DIAGNOSIS — M5416 Radiculopathy, lumbar region: Secondary | ICD-10-CM | POA: Diagnosis not present

## 2020-09-06 ENCOUNTER — Telehealth: Payer: Self-pay

## 2020-09-06 NOTE — Progress Notes (Addendum)
Unable to leave a message to confirm patient telephone appointment on 09/07/2020 for CCM at 1:000 pm with Junius Argyle the Clinical pharmacist.   [place] Pharmacist Assistant [phone]

## 2020-09-07 ENCOUNTER — Ambulatory Visit: Payer: Medicare Other

## 2020-09-07 ENCOUNTER — Telehealth: Payer: Self-pay

## 2020-09-07 DIAGNOSIS — E785 Hyperlipidemia, unspecified: Secondary | ICD-10-CM

## 2020-09-07 DIAGNOSIS — I480 Paroxysmal atrial fibrillation: Secondary | ICD-10-CM

## 2020-09-07 DIAGNOSIS — I10 Essential (primary) hypertension: Secondary | ICD-10-CM

## 2020-09-07 NOTE — Telephone Encounter (Signed)
Pt calling to speak with Cristie Hem.  Pt explained that he forget to mention something else to him.  I told pt that I would send Alex a TE to call pt back.  Please advise,  [phone]

## 2020-09-07 NOTE — Patient Instructions (Signed)
Visit Information It was great speaking with you today!  Please let me know if you have any questions about our visit. Goals Addressed            This Visit's Progress   . Chronic Care Management       CARE PLAN ENTRY (see longitudinal plan of care for additional care plan information)  Current Barriers:  . Chronic Disease Management support, education, and care coordination needs related to Hypertension, Hyperlipidemia, Atrial Fibrillation, Coronary Artery Disease, Anxiety, Osteoarthritis, BPH and peptic ulcer disease    Hypertension BP Readings from Last 3 Encounters:  06/29/20 120/63  05/11/20 120/68  02/06/20 124/68   . Pharmacist Clinical Goal(s): o Over the next 90 days, patient will work with PharmD and providers to maintain BP goal <140/90 . Current regimen:  o Losartan 25 mg daily o Metoprolol Succinate 25 mg daily  . Patient self care activities - Over the next 90 days, patient will: o Check blood pressure once weekly, document, and provide at future appointments o Ensure daily salt intake < 2300 mg/day  Hyperlipidemia Lab Results  Component Value Date/Time   LDLCALC 55 04/16/2018 11:55 AM   LDLDIRECT 68 02/06/2020 05:06 PM   . Pharmacist Clinical Goal(s): o Over the next 90 days, patient will work with PharmD and providers to maintain LDL goal < 70 . Current regimen:  o Pravastatin 40 mg daily   Medication management . Pharmacist Clinical Goal(s): o Over the next 90 days, patient will work with PharmD and providers to maintain optimal medication adherence . Current pharmacy: [facility] . Interventions o Comprehensive medication review performed. o Continue current medication management strategy . Patient self care activities - Over the next 90 days, patient will: o Take medications as prescribed o Report any questions or concerns to PharmD and/or provider(s)       The patient verbalized understanding of instructions, educational materials, and care  plan provided today and declined offer to receive copy of patient instructions, educational materials, and care plan.   Telephone follow up appointment with pharmacy team member scheduled for: 03/08/21 at 1:00 PM  [facility]  [phone]

## 2020-09-07 NOTE — Chronic Care Management (AMB) (Signed)
Chronic Care Management Pharmacy  Name: Brian [name]  MRN: [id number] DOB: 10/05/1932   Chief Complaint/ HPI  Brian [name],  87 y.o. , male presents for their Follow-Up CCM visit with the clinical pharmacist via telephone.  PCP : Libby Maw, MD Patient Care Team: Libby Maw, MD as PCP - General (Family Medicine) Skeet Latch, MD as PCP - Cardiology (Cardiology) Germaine Pomfret, [facility] as Pharmacist (Pharmacist)  Their chronic conditions include: Hypertension, Hyperlipidemia, Atrial Fibrillation, Coronary Artery Disease, Anxiety, Osteoarthritis, BPH and peptic ulcer disease   Office Visits: 05/11/20: Patient presented to Dr. Ethelene Hal for follow-up. Patient given doxycycline x 10 days for sinusitis.  02/06/20: Patient presented to Dr. Ethelene Hal for follow-up. Patient with swollen legs and sore on left legs, otherwise stable. Patient instructed to elevate legs at night and to use clotrimazole on abdomen rash. Referral placed to home health.   Consult Visit: 07/30/20: Dr Oval Linsey started patient on losartan 25 mg daily due to BP 170/90.  06/29/20: Patient presented to Dr. Oval Linsey (cardiology) for follow-up. BP 120/63. Patient in A-Fib, on lower dose of Eliquis d/t history of GI bleed. No medication changes made.   Subjective:  Patient was tired, but in good spirits today. He has been happy with his health overall, and thinks his blood pressures have improved since starting the new medication prescribed by cardiology. He is concerned about his weight, and is trying to work on weight loss through his diet.   Allergies  Allergen Reactions  . Novocain [Procaine Hcl]     Irregular heart beat     . Other     Patient had problem with epidural with his right hip surgery. Went to up extremity instead of lower extremity   . Penicillins Itching    Has patient had a PCN reaction causing immediate rash, facial/tongue/throat swelling, SOB or lightheadedness with  hypotension: Unk Has patient had a PCN reaction causing severe rash involving mucus membranes or skin necrosis: Unk Has patient had a PCN reaction that required hospitalization: Unk Has patient had a PCN reaction occurring within the last 10 years: No If all of the above answers are "NO", then may proceed with Cephalosporin use.  No reaction noted    Medications: Outpatient Encounter Medications as of 09/07/2020  Medication Sig  . acetaminophen (TYLENOL) 500 MG tablet Take 500 mg by mouth every 6 (six) hours as needed.  . ALPRAZolam (XANAX) 0.25 MG tablet TAKE 1 TABLET(0.25 MG) BY MOUTH AT BEDTIME  . cetirizine (ZYRTEC) 10 MG tablet TAKE 1 TABLET(10 MG) BY MOUTH AT BEDTIME  . chlorhexidine (PERIDEX) 0.12 % solution chlorhexidine gluconate 0.12 % mouthwash  SWISH GENTLY WITH 1/2 CAPFUL FOR 30 SECONDS TWICE DAILY AFTER EATING.  . clotrimazole (LOTRIMIN AF) 1 % cream Apply 1 application topically 2 (two) times daily.  Marland Kitchen doxycycline (VIBRA-TABS) 100 MG tablet TAKE 1 TABLET(100 MG) BY MOUTH TWICE DAILY  . ELIQUIS 2.5 MG TABS tablet TAKE 1 TABLET(2.5 MG) BY MOUTH TWICE DAILY  . finasteride (PROSCAR) 5 MG tablet TAKE 1 TABLET(5 MG) BY MOUTH DAILY  . fluticasone (FLONASE) 50 MCG/ACT nasal spray fluticasone propionate 50 mcg/actuation nasal spray,suspension  . hydrocortisone (ANUCORT-HC) 25 MG suppository UNWRAP AND INSERT 1 SUPPOSITORY RECTALLY TWICE DAILY  . hydrocortisone (PROCTOZONE-HC) 2.5 % rectal cream USE RECTALLY TWICE DAILY  . hydrocortisone-pramoxine (ANALPRAM-HC) 2.5-1 % rectal cream Place 1 application rectally 2 (two) times daily as needed for hemorrhoids.   . lidocaine (LIDODERM) 5 % APPLY 1 PATCH TOPICALLY  ONCE DAILY.MAY WEAR UPTO 12 HOURS MAXIMUM IN A DAY.  Marland Kitchen losartan (COZAAR) 25 MG tablet Take 1 tablet (25 mg total) by mouth daily.  . metoprolol succinate (TOPROL-XL) 25 MG 24 hr tablet TAKE 1 TABLET(25 MG) BY MOUTH DAILY  . Multiple Vitamin (MULTIVITAMIN) tablet Take 2 tablets by  mouth daily.  Marland Kitchen oxyCODONE-acetaminophen (PERCOCET/ROXICET) 5-325 MG tablet Take 1 tablet by mouth 2 (two) times daily.  Marland Kitchen oxyCODONE-acetaminophen (PERCOCET/ROXICET) 5-325 MG tablet Take 1 tablet by mouth 2 (two) times daily as needed for severe pain.  . pantoprazole (PROTONIX) 40 MG tablet TAKE 1 TABLET(40 MG) BY MOUTH DAILY  . polyethylene glycol powder (GLYCOLAX/MIRALAX) 17 GM/SCOOP powder Mix one scoop with water and take daily until stools loosen. May repeat as needed.  . pravastatin (PRAVACHOL) 40 MG tablet TAKE 1 TABLET(40 MG) BY MOUTH DAILY  . sertraline (ZOLOFT) 50 MG tablet TAKE 1 TABLET(50 MG) BY MOUTH EVERY MORNING  . Silver (SILVASORB) GEL Apply daily to wound to keep moist.   No facility-administered encounter medications on file as of 09/07/2020.     Current Diagnosis/Assessment:  SDOH Interventions   Flowsheet Row Most Recent Value  SDOH Interventions   Financial Strain Interventions Intervention Not Indicated  Transportation Interventions Intervention Not Indicated      Goals Addressed            This Visit's Progress   . Chronic Care Management       CARE PLAN ENTRY (see longitudinal plan of care for additional care plan information)  Current Barriers:  . Chronic Disease Management support, education, and care coordination needs related to Hypertension, Hyperlipidemia, Atrial Fibrillation, Coronary Artery Disease, Anxiety, Osteoarthritis, BPH and peptic ulcer disease    Hypertension BP Readings from Last 3 Encounters:  06/29/20 120/63  05/11/20 120/68  02/06/20 124/68   . Pharmacist Clinical Goal(s): o Over the next 90 days, patient will work with PharmD and providers to maintain BP goal <140/90 . Current regimen:  o Losartan 25 mg daily o Metoprolol Succinate 25 mg daily  . Patient self care activities - Over the next 90 days, patient will: o Check blood pressure once weekly, document, and provide at future appointments o Ensure daily salt intake <  2300 mg/day  Hyperlipidemia Lab Results  Component Value Date/Time   LDLCALC 55 04/16/2018 11:55 AM   LDLDIRECT 68 02/06/2020 05:06 PM   . Pharmacist Clinical Goal(s): o Over the next 90 days, patient will work with PharmD and providers to maintain LDL goal < 70 . Current regimen:  o Pravastatin 40 mg daily   Medication management . Pharmacist Clinical Goal(s): o Over the next 90 days, patient will work with PharmD and providers to maintain optimal medication adherence . Current pharmacy: [facility] . Interventions o Comprehensive medication review performed. o Continue current medication management strategy . Patient self care activities - Over the next 90 days, patient will: o Take medications as prescribed o Report any questions or concerns to PharmD and/or provider(s)       AFIB   Managed by Dr. Oval Linsey  Patient is currently rate controlled.  Patient has failed these meds in past: warfarin (ineffective)  Patient is currently controlled on the following medications:   . Eliquis 2.5 mg BID (History of GI bleed)  . Metoprolol Succinate 25 mg daily   We discussed:  Denies unusual bruising or bleeding. Denies challenges with affording Eliquis.   Plan  Continue current medications  Hypertension   BP goal is:  <140/90  Office blood pressures are  BP Readings from Last 3 Encounters:  06/29/20 120/63  05/11/20 120/68  02/06/20 124/68   CMP Latest Ref Rng & Units 05/11/2020 02/06/2020 11/06/2019  Glucose 70 - 99 mg/dL 96 99 91  BUN 6 - 23 mg/dL _0 Creatinine 0.40 - 1.50 mg/dL 0.68 0.63(L) 0.49  Sodium 135 - 145 mEq/L 135 136 131(L)  Potassium 3.5 - 5.1 mEq/L 4.6 4.6 4.4  Chloride 96 - 112 mEq/L 101 100 99  CO2 19 - 32 mEq/L _1 Calcium 8.4 - 10.5 mg/dL 9.2 8.9 9.1  Total Protein 6.0 - 8.3 g/dL 6.6 - -  Total Bilirubin 0.2 - 1.2 mg/dL 0.5 - -  Alkaline Phos 39 - 117 U/L 78 - -  AST 0 - 37 U/L 21 - -  ALT 0 - 53 U/L 17 - -   Patient checks BP at  home 1-2x per week Patient home BP readings are ranging: 121/64, 126/62, 105/65, 134/65   Patient has failed these meds in the past: n/a Patient is currently controlled on the following medications:  . Losartan 25 mg daily  . Metoprolol Succinate 25 mg daily   We discussed diet and exercise extensively. Eating more vegetables and fruit, eating more fish, cole slaw. Sometimes will have a hamburger or meatloaf once weekly.   Tolerating newly added losartan, denies dizziness, chest pain, or chronic cough.   Plan  Continue current medications   Hyperlipidemia   Embolic CVA 7867   LDL goal < 70  Lipid Panel     Component Value Date/Time   CHOL 134 04/16/2018 1155   TRIG 139 04/16/2018 1155   HDL 51 04/16/2018 1155   LDLCALC 55 04/16/2018 1155   LDLDIRECT 68 02/06/2020 1706    Hepatic Function Latest Ref Rng & Units 05/11/2020 05/12/2018 04/16/2018  Total Protein 6.0 - 8.3 g/dL 6.6 6.4(L) 6.4  Albumin 3.5 - 5.2 g/dL 4.1 3.5 4.0  AST 0 - 37 U/L _2 ALT 0 - 53 U/L 17 24 37  Alk Phosphatase 39 - 117 U/L 78 73 100  Total Bilirubin 0.2 - 1.2 mg/dL 0.5 0.8 0.8  Bilirubin, Direct 0.0 - 0.3 mg/dL - - -     The ASCVD Risk score Mikey Bussing DC Jr., et al., 2013) failed to calculate for the following reasons:   The 2013 ASCVD risk score is only valid for ages 68 to 29   The patient has a prior MI or stroke diagnosis   Patient has failed these meds in past: n/a Patient is currently controlled on the following medications:  . Pravastatin 40 mg daily   We discussed:  diet and exercise extensively  Plan  Continue current medications  Depression / Anxiety   PHQ9 Score:  PHQ9 SCORE ONLY 03/09/2020 04/30/2018 09/26/2016  PHQ-9 Total Score 0 0 0   GAD7 Score: No flowsheet data found.  Patient has failed these meds in past: n/a Patient is currently controlled on the following medications:  . Alprazolam 0.25 mg QHS  . Sertraline 50 mg daily   We discussed:  Denies oversedation,  dizziness, or unsteadiness from alprazolam. Still has racing thoughts, but has been much better on his current regimen.   Plan  Continue current medications  BPH   PSA  Date Value Ref Range Status  06/03/2012 1.92 0.10 - 4.00 ng/mL Final  01/23/2011 1.37 0.10 - 4.00 ng/mL Final  07/04/2010 1.28 0.10 - 4.00 ng/mL Final  Patient has failed these meds in past: n/a Patient is currently controlled on the following medications:  . Finasteride 5 mg daily   We discussed:  Some urinary hesitancy   Plan  Continue current medications  Peptic Ulcer Disease   History of bleeding duodenal ulcer in 2002   Patient has failed these meds in past: n/a Patient is currently controlled on the following medications:  . Pantoprazole 40 mg daily   Plan  Continue current medications  Chronic Pain    Osteoarthritis of Knee (receives injections via Dr. Delilah Shan to help)   Patient has failed these meds in past: n/a Patient is currently managed on the following medications:  . Lidocaine 5% patch daily (not taking) . Oxycodone-APAP 5-325 mg BID  . Tylenol Extra Strength 6-8 tablets   We discussed:  Patient with "terrible" headaches that happen almost daily. Counseled patient to not exceed 3000 mg of tylenol daily from ALL sources.   Plan  Continue current medications  Misc / OTC   . Cetirizine 10 mg QHS . Clotrimazole 1% cream BID -abdominal candidiasis (improving)  . Hydrocortione 25 mg suppository BID  . Hydrocortison 2.5% rectal cream  . Hydrocortisone-Pramoxinxe 2.5-1% rectal cream BID PRN  . Miralax 17g daily PRN (not taking) . Silver Gel (not taking) . Chewable Multivitamin (Vitafusion) 2 tablets daily.   We discussed:     Plan  Continue current medications  Vaccines   Reviewed and discussed patient's vaccination history.    Immunization History  Administered Date(s) Administered  . Fluad Quad(high Dose 65+) 05/11/2020  . H1N1 09/01/2008  . Influenza Inj Mdck  Quad Pf 05/27/2018  . Influenza Split 05/15/2011  . Influenza Whole 04/26/2009, 04/25/2013  . Influenza, High Dose Seasonal PF 05/17/2017, 05/12/2019, 05/12/2019  . Influenza,inj,Quad PF,6+ Mos 05/10/2016  . Influenza-Unspecified 05/03/2014, 05/12/2015, 06/14/2017  . PFIZER(Purple Top)SARS-COV-2 Vaccination 11/20/2019, 12/19/2019  . Pneumococcal Conjugate-13 07/21/2013  . Pneumococcal Polysaccharide-23 09/04/2007  . Td 09/04/2007, 03/12/2008  . Tdap 05/03/2014  . Zoster 01/27/2011   Medication Management   Pt uses [place] for all medications Uses pill box? Yes, daughter helps fill  Plan  Continue current medication management strategy  Follow up: 6 month phone visit  [facility] at [facility]  [phone]

## 2020-09-13 ENCOUNTER — Telehealth: Payer: Self-pay | Admitting: Internal Medicine

## 2020-09-13 NOTE — Telephone Encounter (Signed)
Patient called the on-call service, he was exposed to somebody with Covid yesterday, he is feeling okay except for mild headache.  No fever chills No chest pain or difficulty breathing.  He is asking for further advice. Nurse to relay the following information to the patient: Rest, fluids, Tylenol, check vital signs/ O2 sat if possible. Patient to call PCP tomorrow for further advice If severe symptoms tonight: High fever, chest pain, difficulty breathing: ER

## 2020-09-14 ENCOUNTER — Encounter: Payer: Self-pay | Admitting: Family

## 2020-09-14 ENCOUNTER — Telehealth (INDEPENDENT_AMBULATORY_CARE_PROVIDER_SITE_OTHER): Payer: Medicare Other | Admitting: Family

## 2020-09-14 VITALS — Ht 66.0 in

## 2020-09-14 DIAGNOSIS — L89300 Pressure ulcer of unspecified buttock, unstageable: Secondary | ICD-10-CM

## 2020-09-14 DIAGNOSIS — J019 Acute sinusitis, unspecified: Secondary | ICD-10-CM | POA: Diagnosis not present

## 2020-09-14 DIAGNOSIS — B9689 Other specified bacterial agents as the cause of diseases classified elsewhere: Secondary | ICD-10-CM | POA: Diagnosis not present

## 2020-09-14 DIAGNOSIS — N489 Disorder of penis, unspecified: Secondary | ICD-10-CM | POA: Diagnosis not present

## 2020-09-14 DIAGNOSIS — R269 Unspecified abnormalities of gait and mobility: Secondary | ICD-10-CM

## 2020-09-14 DIAGNOSIS — M5416 Radiculopathy, lumbar region: Secondary | ICD-10-CM

## 2020-09-15 MED ORDER — DOXYCYCLINE HYCLATE 100 MG PO TABS: 100.0000 mg | ORAL_TABLET | Freq: Two times a day (BID) | ORAL | 0 refills | Status: DC

## 2020-09-15 NOTE — Progress Notes (Addendum)
Virtual Visit via Telephone Note  I connected with Brian [name] on 09/15/20 at  2:40 PM EST by telephone and verified that I am speaking with the correct person using two identifiers.  Location: Participating in the visit: Patient: Brian [name] Provider: Dutch Quint   I discussed the limitations, risks, security and privacy concerns of performing an evaluation and management service by telephone and the availability of in person appointments. I also discussed with the patient that there may be a patient responsible charge related to this service. The patient expressed understanding and agreed to proceed.   History of Present Illness: 87 year old male is in today requesting a refill of Doxycycline. Reports his previous provider prescribing Doxycycline on a daily basis to prevent infection. Has c/o decubitus ulcer on his buttocks that is not new. He also reports using a condom catheter daily and now has irritation on his penis. No burning with urination internally but does have pain when urine touches the open area of his penis. Has concerns of sinus pressure and congestion that has been going on over a week. Feels Doxycycline helps when he has these symptoms.    Observations/Objective: A&O, NAD,    Assessment and Plan: Brian [name] was seen today for follow-up.  Diagnoses and all orders for this visit:  Penile lesion  Acute bacterial sinusitis  Pressure injury of buttock, unstageable, unspecified laterality ([place])  Other orders -     doxycycline (VIBRA-TABS) 100 MG tablet; Take 1 tablet (100 mg total) by mouth 2 (two) times daily.     Follow Up Instructions: Advised patient to follow-up with Dr. Ethelene Hal to assess the penile lesion and decubitus ulcer on the buttocks.     I discussed the assessment and treatment plan with the patient. The patient was provided an opportunity to ask questions and all were answered. The patient agreed with the plan and demonstrated an understanding of the  instructions.   The patient was advised to call back or seek an in-person evaluation if the symptoms worsen or if the condition fails to improve as anticipated.  I provided 20 minutes of non-face-to-face time during this encounter.   Kennyth Arnold, FNP

## 2020-09-17 ENCOUNTER — Other Ambulatory Visit: Payer: Self-pay

## 2020-09-17 DIAGNOSIS — J019 Acute sinusitis, unspecified: Secondary | ICD-10-CM

## 2020-09-17 NOTE — Telephone Encounter (Signed)
Refill request on pending medication. Please advise.

## 2020-09-19 MED ORDER — DOXYCYCLINE HYCLATE 100 MG PO TABS: ORAL_TABLET | ORAL | 0 refills | Status: DC

## 2020-09-27 ENCOUNTER — Other Ambulatory Visit: Payer: Self-pay | Admitting: Family Medicine

## 2020-09-27 DIAGNOSIS — K219 Gastro-esophageal reflux disease without esophagitis: Secondary | ICD-10-CM

## 2020-09-27 NOTE — Progress Notes (Deleted)
Cardiology Clinic Note   Patient Name: Brian [name] Date of Encounter: 09/27/2020  Primary Care Provider:  Libby Maw, MD Primary Cardiologist:  Skeet Latch, MD  Patient Profile    Brian [name] 87 year old male presents the clinic today for follow-up evaluation of his hypertension.  Past Medical History    Past Medical History:  Diagnosis Date  . Anemia   . Atrial flutter ([place])   . BPH (benign prostatic hypertrophy)   . Epistaxis   . Hemorrhage of gastrointestinal tract, unspecified   . History of open heart surgery   . Hypertension   . Obesity (BMI 30.0-34.9) 06/29/2020  . Osteoarthrosis, unspecified whether generalized or localized, unspecified site   . Personal history of venous thrombosis and embolism   . Rosacea   . Stroke ([place])   . TIA (transient ischemic attack)    Past Surgical History:  Procedure Laterality Date  . APPENDECTOMY    . HERNIA REPAIR    . IR GENERIC HISTORICAL  05/04/2016   IR PERCUTANEOUS ART THROMBECTOMY/INFUSION INTRACRANIAL INC DIAG ANGIO 05/04/2016 [name], MD [facility]  . IR GENERIC HISTORICAL  05/04/2016   IR ANGIO VERTEBRAL SEL SUBCLAVIAN INNOMINATE UNI R MOD SED 05/04/2016 [name], MD [facility]  . IR GENERIC HISTORICAL  06/07/2016   IR RADIOLOGIST EVAL & MGMT 06/07/2016 [facility]  . JOINT REPLACEMENT    . MITRAL VALVE REPAIR    . mvp repair    . RADIOLOGY WITH ANESTHESIA N/A 05/04/2016   Procedure: RADIOLOGY WITH ANESTHESIA;  Surgeon: [name], MD;  Location: [place];  Service: Radiology;  Laterality: N/A;  . TOTAL HIP ARTHROPLASTY      Allergies  Allergies  Allergen Reactions  . Novocain [Procaine Hcl]     Irregular heart beat     . Other     Patient had problem with epidural with his right hip surgery. Went to up extremity instead of lower extremity   . Penicillins Itching    Has patient had a PCN reaction causing immediate rash, facial/tongue/throat swelling, SOB or  lightheadedness with hypotension: Unk Has patient had a PCN reaction causing severe rash involving mucus membranes or skin necrosis: Unk Has patient had a PCN reaction that required hospitalization: Unk Has patient had a PCN reaction occurring within the last 10 years: No If all of the above answers are "NO", then may proceed with Cephalosporin use.  No reaction noted    History of Present Illness    Mr. Clemons has a PMH of paroxysmal atrial fibrillation, GI bleeds, prior CVA, mitral valve repair, and hypertension.  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  He underwent embolectomy.  His INR was 2.8 on admission.  He was switched to Eliquis 2.5 twice daily prior to discharge.  He developed a ulcer transfusion at that time.  Subsequent confusion and he has not noticed any recurrent GI bleeds.  He had a mitral valve repaired at [facility] in 2002.  His echocardiogram 9/17 showed an LVEF 55-60%, mildly elevated pulmonary pressures.  Nuclear stress test 6/5 showed LVEF of 64% and a nonreversible inferior defect that was thought to be diaphragmatic attenuation versus infarct.  Echocardiogram 3/11 showed normal systolic function, mild aortic regurgitation, and mild mitral regurgitation.  He was seen by Dr. Stanford Breed 6/14 and was then atrial flutter.  His rate was well controlled and he was asymptomatic.  A  he followed up with Dr. Oval Linsey 11/21.  During that time he was feeling well.  He had been struggling with gaining weight.  He reported that he was mostly in his wheelchair throughout the day.  He reported that he had some decubitus ulcers that were painful.  He was trying to use the [place] machine twice per week for exercise.  He was somewhat limited in his physical activity due to the pain in his knees and shoulders.  He reported that his diet was good and he was not eating very much.  He explained he was trying to  limit his carbohydrates and did not understand why he was not able to lose weight.  He denied lower extremity edema, orthopnea, PND, chest pain, and shortness of breath.  He was living with his son who is helping care for him.  He reported he was taking Tylenol for his pain but still have breakthrough episodes.  He was getting injections from orthopedics that have not been very helpful.  He denied palpitations, lightheadedness, dizziness.  He specifically denied recurrent melena and hematochezia.  He presents the clinic today for follow-up evaluation states***  *** denies chest pain, shortness of breath, lower extremity edema, fatigue, palpitations, melena, hematuria, hemoptysis, diaphoresis, weakness, presyncope, syncope, orthopnea, and PND.  Home Medications    Prior to Admission medications   Medication Sig Start Date End Date Taking? Authorizing Provider  acetaminophen (TYLENOL) 500 MG tablet Take 500 mg by mouth every 6 (six) hours as needed.    [provider]  ALPRAZolam Duanne Moron) 0.25 MG tablet TAKE 1 TABLET(0.25 MG) BY MOUTH AT BEDTIME 06/29/20   Dutch Quint B, FNP  cetirizine (ZYRTEC) 10 MG tablet TAKE 1 TABLET(10 MG) BY MOUTH AT BEDTIME 06/15/20   Libby Maw, MD  chlorhexidine (PERIDEX) 0.12 % solution chlorhexidine gluconate 0.12 % mouthwash  SWISH GENTLY WITH 1/2 CAPFUL FOR 30 SECONDS TWICE DAILY AFTER EATING.    [provider]  clotrimazole (LOTRIMIN AF) 1 % cream Apply 1 application topically 2 (two) times daily. 02/03/20   Ladene Artist, MD  doxycycline (VIBRA-TABS) 100 MG tablet Take 1 tablet (100 mg total) by mouth 2 (two) times daily. 09/15/20   Kennyth Arnold, FNP  doxycycline (VIBRA-TABS) 100 MG tablet Take 1 tablet (100mg ) by mouth twice daily 09/19/20   Dutch Quint B, FNP  ELIQUIS 2.5 MG TABS tablet TAKE 1 TABLET(2.5 MG) BY MOUTH TWICE DAILY 08/23/20   Dutch Quint B, FNP  finasteride (PROSCAR) 5 MG tablet TAKE 1 TABLET(5 MG) BY MOUTH DAILY  03/21/20   Libby Maw, MD  fluticasone [facility]) 50 MCG/ACT nasal spray fluticasone propionate 50 mcg/actuation nasal spray,suspension    [provider]  hydrocortisone (ANUCORT-HC) 25 MG suppository UNWRAP AND INSERT 1 SUPPOSITORY RECTALLY TWICE DAILY 02/03/20   Ladene Artist, MD  hydrocortisone (PROCTOZONE-HC) 2.5 % rectal cream USE RECTALLY TWICE DAILY 11/15/18   Ladene Artist, MD  hydrocortisone-pramoxine [facility]) 2.5-1 % rectal cream Place 1 application rectally 2 (two) times daily as needed for hemorrhoids.     [provider]  lidocaine (LIDODERM) 5 % APPLY 1 PATCH TOPICALLY ONCE DAILY.MAY WEAR UPTO 12 HOURS MAXIMUM IN A DAY. 06/17/19   [provider]  losartan (COZAAR) 25 MG tablet Take 1 tablet (25 mg total) by mouth daily. 08/10/20 11/08/20  Skeet Latch, MD  metoprolol succinate (TOPROL-XL) 25 MG 24 hr tablet TAKE 1 TABLET(25 MG) BY MOUTH DAILY 06/28/20   Libby Maw, MD  Multiple Vitamin (MULTIVITAMIN) tablet Take 2 tablets by mouth daily.    [provider]  oxyCODONE-acetaminophen (PERCOCET/ROXICET) 5-325 MG tablet Take 1 tablet by mouth 2 (two) times daily. 04/14/20   Libby Maw, MD  oxyCODONE-acetaminophen (PERCOCET/ROXICET) 5-325 MG tablet Take 1 tablet by mouth 2 (two) times daily as needed for severe pain. 07/27/20   Dutch Quint B, FNP  pantoprazole (PROTONIX) 40 MG tablet TAKE 1 TABLET(40 MG) BY MOUTH DAILY 09/27/20   Dutch Quint B, FNP  polyethylene glycol powder (GLYCOLAX/MIRALAX) 17 GM/SCOOP powder Mix one scoop with water and take daily until stools loosen. May repeat as needed. 01/20/20   Libby Maw, MD  pravastatin (PRAVACHOL) 40 MG tablet TAKE 1 TABLET(40 MG) BY MOUTH DAILY 11/03/19   Libby Maw, MD  sertraline (ZOLOFT) 50 MG tablet TAKE 1 TABLET(50 MG) BY MOUTH EVERY MORNING 02/05/20   Libby Maw, MD  Silver [place]aze [place]) GEL Apply daily to wound to keep moist.  05/20/19   Libby Maw, MD    Family History    Family History  Problem Relation Age of Onset  . Rheumatic fever Mother   . Coronary artery disease Father    He indicated that his mother is deceased. He indicated that his father is deceased. He indicated that his maternal grandmother is deceased. He indicated that his maternal grandfather is deceased. He indicated that his paternal grandmother is deceased. He indicated that his paternal grandfather is deceased.  Social History    Social History   Socioeconomic History  . Marital status: Married    Spouse name: Not on file  . Number of children: Not on file  . Years of education: Not on file  . Highest education level: Not on file  Occupational History  . Not on file  Tobacco Use  . Smoking status: Former Research scientist (life sciences)  . Smokeless tobacco: Never Used  . Tobacco comment: quit 50 yr ago  Vaping Use  . Vaping Use: Never used  Substance and Sexual Activity  . Alcohol use: No  . Drug use: No  . Sexual activity: Not on file  Other Topics Concern  . Not on file  Social History Narrative  . Not on file   Social Determinants of Health   Financial Resource Strain: Low Risk   . Difficulty of Paying Living Expenses: Not hard at all  Food Insecurity: Not on file  Transportation Needs: No Transportation Needs  . Lack of Transportation (Medical): No  . Lack of Transportation (Non-Medical): No  Physical Activity: Not on file  Stress: Not on file  Social Connections: Not on file  Intimate Partner Violence: Not on file     Review of Systems    General:  No chills, fever, night sweats or weight changes.  Cardiovascular:  No chest pain, dyspnea on exertion, edema, orthopnea, palpitations, paroxysmal nocturnal dyspnea. Dermatological: No rash, lesions/masses Respiratory: No cough, dyspnea Urologic: No hematuria, dysuria Abdominal:   No nausea, vomiting, diarrhea, bright red blood per rectum, melena, or  hematemesis Neurologic:  No visual changes, wkns, changes in mental status. All other systems reviewed and are otherwise negative except as noted above.  Physical Exam    VS:  There were no vitals taken for this visit. , BMI There is no height or weight on file to calculate BMI. GEN: Well nourished, well developed, in no acute distress. HEENT: normal. Neck: Supple, no JVD, carotid bruits, or masses. Cardiac: RRR, no murmurs, rubs, or gallops. No clubbing, cyanosis, edema.  Radials/DP/PT 2+ and equal bilaterally.  Respiratory:  Respirations regular and unlabored, clear to auscultation bilaterally. GI: Soft, nontender, nondistended, BS + x 4. MS: no deformity or atrophy. Skin: warm and dry, no rash. Neuro:  Strength and sensation are intact. Psych: Normal affect.  Accessory Clinical Findings    Recent Labs: 11/06/2019: Hemoglobin 15.1; Platelets 241.0 05/11/2020: ALT 17; BUN 16; Creatinine, Ser 0.68; Potassium 4.6; Sodium 135   Recent Lipid Panel    Component Value Date/Time   CHOL 134 04/16/2018 1155   TRIG 139 04/16/2018 1155   HDL 51 04/16/2018 1155   CHOLHDL 2.6 04/16/2018 1155   CHOLHDL 3.4 10/03/2016 1157   VLDL 22 10/03/2016 1157   LDLCALC 55 04/16/2018 1155   LDLDIRECT 68 02/06/2020 1706    ECG personally reviewed by me today- *** - No acute changes  Echocardiogram 05/06/2016  Study Conclusions   - Left ventricle: The cavity size was normal. Wall thickness was  normal. Systolic function was normal. The estimated ejection  fraction was in the range of 55% to 60%. Wall motion was normal;  there were no regional wall motion abnormalities. Mitral  annuloplasty precludes evaluation of LV diastolic function.  - Ventricular septum: Septal motion showed paradox. These changes  are consistent with a post-thoracotomy state.  - Mitral valve: Prior procedures included surgical repair. An  annular ring prosthesis was present and functioning normally.  Valve  area by pressure half-time: 2.47 cm^2.  - Left atrium: The atrium was mildly dilated.  - Pulmonary arteries: Systolic pressure was mildly increased. PA  peak pressure: 44 mm Hg (S).   Assessment & Plan   1.  Essential hypertension-BP today***.  Well-controlled at home. Continue losartan, metoprolol Heart healthy low-sodium diet-salty 6 given Increase physical activity as tolerated  Persistent atrial fibrillation/embolic stroke -heart rate today***.  Continues to be asymptomatic.  Denies bleeding issues.  CHA2DS2-VASc score 5 Continue Eliquis, metoprolol Avoid triggers caffeine, chocolate, EtOH, dehydration etc.  Mitral valve repair-no increased DOE or activity intolerance.  Mitral valve stable echocardiogram 9/17.  Hyperlipidemia-direct LDL 68 on 02/06/2020. Continue pravastatin  Disposition: Follow-up with Dr. Oval Linsey in 3 months.    Jossie Ng. Benjamin Merrihew NP-C    09/27/2020, 12:31 PM [address] Office [phone] Fax [phone]  Notice: This dictation was prepared with Dragon dictation along with smaller phrase technology. Any transcriptional errors that result from this process are unintentional and may not be corrected upon review.  I spent***minutes examining this patient, reviewing medications, and using patient centered shared decision making involving her cardiac care.  Prior to her visit I spent greater than 20 minutes reviewing her past medical history,  medications, and prior cardiac tests.

## 2020-09-28 ENCOUNTER — Ambulatory Visit: Payer: Medicare Other | Admitting: General Practice

## 2020-10-03 ENCOUNTER — Other Ambulatory Visit: Payer: Self-pay | Admitting: Family

## 2020-10-03 DIAGNOSIS — F419 Anxiety disorder, unspecified: Secondary | ICD-10-CM

## 2020-10-05 ENCOUNTER — Ambulatory Visit: Payer: Medicare Other | Admitting: Family Medicine

## 2020-10-06 ENCOUNTER — Telehealth: Payer: Self-pay

## 2020-10-06 NOTE — Telephone Encounter (Signed)
Patient calling to ask PCP to place an order for PT.  Pt could not understand me as far as me asking him did he have an order from a home health agency or some type of order request.  Pt did not answer question and asked if I would send this message back.  Please advise. CB#[phone]

## 2020-10-08 ENCOUNTER — Other Ambulatory Visit: Payer: Self-pay | Admitting: Family Medicine

## 2020-10-08 DIAGNOSIS — J019 Acute sinusitis, unspecified: Secondary | ICD-10-CM

## 2020-10-08 NOTE — Telephone Encounter (Signed)
Pt son calling and saying pt needs a refill on doxycycline and sent to [facility] in [place]

## 2020-10-08 NOTE — Telephone Encounter (Signed)
Patient calling to see if he could have a referral placed to have PT come out a couple days during the week for physical therapy> Please advise

## 2020-10-11 MED ORDER — DOXYCYCLINE HYCLATE 100 MG PO TABS: ORAL_TABLET | ORAL | 0 refills | Status: DC

## 2020-10-11 NOTE — Telephone Encounter (Signed)
Refill request for pending Rx patient last virtual visit 09/14/20 last refill 09/19/20> Patient states that he has a hard time with transportation last visit virtual due to not being able to come in the office Doxycyline sent in for daily basis by PCP patient calling to see if this could be refilled. Please advise.

## 2020-10-12 ENCOUNTER — Telehealth: Payer: Self-pay

## 2020-10-12 NOTE — Telephone Encounter (Signed)
Rx sent in yesterday family aware to pick up

## 2020-10-12 NOTE — Telephone Encounter (Signed)
Pts son, Trusten Hume, is requesting a refill of  doxycycline (VIBRA-TABS) 100 MG tablet be sent to [facility] on [address], [place],

## 2020-10-21 ENCOUNTER — Telehealth: Payer: Self-pay | Admitting: Family Medicine

## 2020-10-21 NOTE — Telephone Encounter (Signed)
Patient states that he would like a referral for PT sent to [facility]. Please call him if you have any additional questions.

## 2020-10-22 ENCOUNTER — Telehealth: Payer: Self-pay | Admitting: Cardiovascular Disease

## 2020-10-22 NOTE — Telephone Encounter (Signed)
Pt called stating he has gained roughly 35 lbs over a 5 year time frame and requesting if Dr. Oval Linsey feels his heart is strong enough to have laser sculpting to remove fat. Pt state he has some skin irritation in skin fold and really wanting to have them removed.  Nurse advised pt to contact pcp regarding skin irration and will forward message to MD for recommendation regarding laser sculpting.  Pt also wanted to thank Dr. Oval Linsey for blood pressure machine and starting him on blood pressure meds. He state he check BP daily and its very good.

## 2020-10-22 NOTE — Telephone Encounter (Signed)
Patient states she had a stroke and was in the hospital. During that time she lost 5 lbs. He states after he got out she was encouraged to eat and he gained about 35 lbs. He states he saw an advertisement for a laser to get rid of some of the fat. He states he has some irritation under his fat rolls. He would like to know if he is strong enough to have the laser.

## 2020-10-26 DIAGNOSIS — M25511 Pain in right shoulder: Secondary | ICD-10-CM | POA: Diagnosis not present

## 2020-10-26 DIAGNOSIS — M25512 Pain in left shoulder: Secondary | ICD-10-CM | POA: Diagnosis not present

## 2020-10-26 NOTE — Telephone Encounter (Signed)
That is pretty much a cosmetic procedure and low risk.  It doesn't require cardiac clearance.

## 2020-10-27 NOTE — Telephone Encounter (Signed)
Referral placed patient aware someone will call to schedule appointment.

## 2020-10-27 NOTE — Telephone Encounter (Signed)
Pt updated with MD's recommendations and verbalized understanding.  

## 2020-10-27 NOTE — Addendum Note (Signed)
Addended by: Lynda Rainwater on: 10/27/2020 11:12 AM   Modules accepted: Orders

## 2020-10-28 DIAGNOSIS — I10 Essential (primary) hypertension: Secondary | ICD-10-CM | POA: Diagnosis not present

## 2020-10-28 DIAGNOSIS — I4891 Unspecified atrial fibrillation: Secondary | ICD-10-CM | POA: Diagnosis not present

## 2020-10-28 DIAGNOSIS — G894 Chronic pain syndrome: Secondary | ICD-10-CM | POA: Diagnosis not present

## 2020-10-28 DIAGNOSIS — J329 Chronic sinusitis, unspecified: Secondary | ICD-10-CM | POA: Diagnosis not present

## 2020-10-28 DIAGNOSIS — F419 Anxiety disorder, unspecified: Secondary | ICD-10-CM | POA: Diagnosis not present

## 2020-10-29 ENCOUNTER — Telehealth: Payer: Self-pay

## 2020-10-29 DIAGNOSIS — M5416 Radiculopathy, lumbar region: Secondary | ICD-10-CM | POA: Diagnosis not present

## 2020-10-29 DIAGNOSIS — Z7901 Long term (current) use of anticoagulants: Secondary | ICD-10-CM | POA: Diagnosis not present

## 2020-10-29 DIAGNOSIS — Z8673 Personal history of transient ischemic attack (TIA), and cerebral infarction without residual deficits: Secondary | ICD-10-CM | POA: Diagnosis not present

## 2020-10-29 DIAGNOSIS — M150 Primary generalized (osteo)arthritis: Secondary | ICD-10-CM | POA: Diagnosis not present

## 2020-10-29 DIAGNOSIS — M069 Rheumatoid arthritis, unspecified: Secondary | ICD-10-CM | POA: Diagnosis not present

## 2020-10-29 DIAGNOSIS — R2689 Other abnormalities of gait and mobility: Secondary | ICD-10-CM | POA: Diagnosis not present

## 2020-10-29 NOTE — Progress Notes (Signed)
Chronic Care Management Pharmacy Assistant   Name: Brian [name]  MRN: [id number] DOB: 07/30/1933    Reason for Encounter:Hypertension  Disease State call   Conditions to be addressed/monitored: HTN  Recent office visits:  09/14/2020 PCP office Dutch Quint started doxycycline (VIBRA-TABS) 100 MG tablet; Take 1 tablet (100 mg total) by mouth 2 (two) times daily  Recent consult visits:  No recent consult visits   Hospital visits:  None in previous 6 months  Medications: Outpatient Encounter Medications as of 10/29/2020  Medication Sig  . acetaminophen (TYLENOL) 500 MG tablet Take 500 mg by mouth every 6 (six) hours as needed.  . ALPRAZolam (XANAX) 0.25 MG tablet TAKE 1 TABLET BY MOUTH EVERY NIGHT AT BEDTIME  . cetirizine (ZYRTEC) 10 MG tablet TAKE 1 TABLET(10 MG) BY MOUTH AT BEDTIME  . chlorhexidine (PERIDEX) 0.12 % solution chlorhexidine gluconate 0.12 % mouthwash  SWISH GENTLY WITH 1/2 CAPFUL FOR 30 SECONDS TWICE DAILY AFTER EATING.  . clotrimazole (LOTRIMIN AF) 1 % cream Apply 1 application topically 2 (two) times daily.  Marland Kitchen doxycycline (VIBRA-TABS) 100 MG tablet Take 1 tablet (100 mg total) by mouth 2 (two) times daily.  Marland Kitchen doxycycline (VIBRA-TABS) 100 MG tablet Take 1 tablet (100mg ) by mouth twice daily  . ELIQUIS 2.5 MG TABS tablet TAKE 1 TABLET(2.5 MG) BY MOUTH TWICE DAILY  . finasteride (PROSCAR) 5 MG tablet TAKE 1 TABLET(5 MG) BY MOUTH DAILY  . fluticasone (FLONASE) 50 MCG/ACT nasal spray fluticasone propionate 50 mcg/actuation nasal spray,suspension  . hydrocortisone (ANUCORT-HC) 25 MG suppository UNWRAP AND INSERT 1 SUPPOSITORY RECTALLY TWICE DAILY  . hydrocortisone (PROCTOZONE-HC) 2.5 % rectal cream USE RECTALLY TWICE DAILY  . hydrocortisone-pramoxine (ANALPRAM-HC) 2.5-1 % rectal cream Place 1 application rectally 2 (two) times daily as needed for hemorrhoids.   . lidocaine (LIDODERM) 5 % APPLY 1 PATCH TOPICALLY ONCE DAILY.MAY WEAR UPTO 12 HOURS MAXIMUM IN A DAY.   Marland Kitchen losartan (COZAAR) 25 MG tablet Take 1 tablet (25 mg total) by mouth daily.  . metoprolol succinate (TOPROL-XL) 25 MG 24 hr tablet TAKE 1 TABLET(25 MG) BY MOUTH DAILY  . Multiple Vitamin (MULTIVITAMIN) tablet Take 2 tablets by mouth daily.  Marland Kitchen oxyCODONE-acetaminophen (PERCOCET/ROXICET) 5-325 MG tablet Take 1 tablet by mouth 2 (two) times daily.  Marland Kitchen oxyCODONE-acetaminophen (PERCOCET/ROXICET) 5-325 MG tablet Take 1 tablet by mouth 2 (two) times daily as needed for severe pain.  . pantoprazole (PROTONIX) 40 MG tablet TAKE 1 TABLET(40 MG) BY MOUTH DAILY  . polyethylene glycol powder (GLYCOLAX/MIRALAX) 17 GM/SCOOP powder Mix one scoop with water and take daily until stools loosen. May repeat as needed.  . pravastatin (PRAVACHOL) 40 MG tablet TAKE 1 TABLET(40 MG) BY MOUTH DAILY  . sertraline (ZOLOFT) 50 MG tablet TAKE 1 TABLET(50 MG) BY MOUTH EVERY MORNING  . Silver (SILVASORB) GEL Apply daily to wound to keep moist.   No facility-administered encounter medications on file as of 10/29/2020.      Star Rating Drugs:  Losartan Pravastatin Reviewed chart prior to disease state call. Spoke with patient regarding BP  Recent Office Vitals: BP Readings from Last 3 Encounters:  06/29/20 120/63  05/11/20 120/68  02/06/20 124/68   Pulse Readings from Last 3 Encounters:  06/29/20 (!) 56  05/11/20 63  02/06/20 88    Wt Readings from Last 3 Encounters:  05/11/20 189 lb (85.7 kg)  02/06/20 185 lb (83.9 kg)  12/22/19 175 lb (79.4 kg)     Kidney Function Lab Results  Component Value Date/Time  CREATININE 0.68 05/11/2020 02:06 PM   CREATININE 0.63 (L) 02/06/2020 05:06 PM   CREATININE 0.49 11/06/2019 02:23 PM   CREATININE 0.64 (L) 10/03/2016 11:57 AM   GFR 110.74 05/11/2020 02:06 PM   GFRNONAA >60 11/03/2018 07:35 PM   GFRNONAA >60 01/23/2011 09:20 AM   GFRAA >60 11/03/2018 07:35 PM   GFRAA >60 01/23/2011 09:20 AM    BMP Latest Ref Rng & Units 05/11/2020 02/06/2020 11/06/2019  Glucose 70 -  99 mg/dL 96 99 91  BUN 6 - 23 mg/dL 16 12 15   Creatinine 0.40 - 1.50 mg/dL 0.68 0.63(L) 0.49  BUN/Creat Ratio 6 - 22 (calc) - 19 -  Sodium 135 - 145 mEq/L 135 136 131(L)  Potassium 3.5 - 5.1 mEq/L 4.6 4.6 4.4  Chloride 96 - 112 mEq/L 101 100 99  CO2 19 - 32 mEq/L 29 28 28   Calcium 8.4 - 10.5 mg/dL 9.2 8.9 9.1    . Current antihypertensive regimen:  ? Losartan 25 mg one tablet  daily ? Metoprolol Succinate 25 mg one tablet daily  . How often are you checking your Blood Pressure? daily . Current home BP readings:  o On 3/11/202 it was 126/61. Patient states his Blood Pressure ranges around 120's/60's.   . What recent interventions/DTPs have been made by any provider to improve Blood Pressure control since last CPP Visit: None ID  . Patient states he has sinus headaches every night but denies dizziness and lightheadedness . Any recent hospitalizations or ED visits since last visit with CPP? No . What diet changes have been made to improve Blood Pressure Control?  o Patient reports he is trying to lose weight, no salt intake. Patient states he enjoys brussels sprouts, coleslaw and fish.  . What exercise is being done to improve your Blood Pressure Control?  o Patient states he is not exercising due to knee, shoulder and elbow pain. Patient is currently doing physical therapy   Adherence Review: Is the patient currently on ACE/ARB medication? Yes Does the patient have >5 day gap between last estimated fill dates? No   Anderson Malta Clinical Production designer, theatre/television/film [phone]

## 2020-11-02 ENCOUNTER — Ambulatory Visit: Payer: Medicare Other | Admitting: Family Medicine

## 2020-11-04 DIAGNOSIS — M5416 Radiculopathy, lumbar region: Secondary | ICD-10-CM | POA: Diagnosis not present

## 2020-11-04 DIAGNOSIS — M069 Rheumatoid arthritis, unspecified: Secondary | ICD-10-CM | POA: Diagnosis not present

## 2020-11-04 DIAGNOSIS — R2689 Other abnormalities of gait and mobility: Secondary | ICD-10-CM | POA: Diagnosis not present

## 2020-11-04 DIAGNOSIS — Z8673 Personal history of transient ischemic attack (TIA), and cerebral infarction without residual deficits: Secondary | ICD-10-CM | POA: Diagnosis not present

## 2020-11-04 DIAGNOSIS — M150 Primary generalized (osteo)arthritis: Secondary | ICD-10-CM | POA: Diagnosis not present

## 2020-11-04 DIAGNOSIS — Z7901 Long term (current) use of anticoagulants: Secondary | ICD-10-CM | POA: Diagnosis not present

## 2020-11-05 DIAGNOSIS — R2689 Other abnormalities of gait and mobility: Secondary | ICD-10-CM | POA: Diagnosis not present

## 2020-11-05 DIAGNOSIS — Z7901 Long term (current) use of anticoagulants: Secondary | ICD-10-CM | POA: Diagnosis not present

## 2020-11-05 DIAGNOSIS — M5416 Radiculopathy, lumbar region: Secondary | ICD-10-CM | POA: Diagnosis not present

## 2020-11-05 DIAGNOSIS — Z8673 Personal history of transient ischemic attack (TIA), and cerebral infarction without residual deficits: Secondary | ICD-10-CM | POA: Diagnosis not present

## 2020-11-05 DIAGNOSIS — M069 Rheumatoid arthritis, unspecified: Secondary | ICD-10-CM | POA: Diagnosis not present

## 2020-11-05 DIAGNOSIS — M150 Primary generalized (osteo)arthritis: Secondary | ICD-10-CM | POA: Diagnosis not present

## 2020-11-06 ENCOUNTER — Other Ambulatory Visit: Payer: Self-pay | Admitting: Family Medicine

## 2020-11-06 DIAGNOSIS — I6300 Cerebral infarction due to thrombosis of unspecified precerebral artery: Secondary | ICD-10-CM

## 2020-11-09 ENCOUNTER — Ambulatory Visit: Payer: Medicare Other | Admitting: Family Medicine

## 2020-11-09 DIAGNOSIS — M170 Bilateral primary osteoarthritis of knee: Secondary | ICD-10-CM | POA: Diagnosis not present

## 2020-11-11 DIAGNOSIS — Z8673 Personal history of transient ischemic attack (TIA), and cerebral infarction without residual deficits: Secondary | ICD-10-CM | POA: Diagnosis not present

## 2020-11-11 DIAGNOSIS — M5416 Radiculopathy, lumbar region: Secondary | ICD-10-CM | POA: Diagnosis not present

## 2020-11-11 DIAGNOSIS — Z7901 Long term (current) use of anticoagulants: Secondary | ICD-10-CM | POA: Diagnosis not present

## 2020-11-11 DIAGNOSIS — M069 Rheumatoid arthritis, unspecified: Secondary | ICD-10-CM | POA: Diagnosis not present

## 2020-11-11 DIAGNOSIS — R2689 Other abnormalities of gait and mobility: Secondary | ICD-10-CM | POA: Diagnosis not present

## 2020-11-11 DIAGNOSIS — M150 Primary generalized (osteo)arthritis: Secondary | ICD-10-CM | POA: Diagnosis not present

## 2020-11-12 DIAGNOSIS — M5416 Radiculopathy, lumbar region: Secondary | ICD-10-CM | POA: Diagnosis not present

## 2020-11-12 DIAGNOSIS — Z7901 Long term (current) use of anticoagulants: Secondary | ICD-10-CM | POA: Diagnosis not present

## 2020-11-12 DIAGNOSIS — M069 Rheumatoid arthritis, unspecified: Secondary | ICD-10-CM | POA: Diagnosis not present

## 2020-11-12 DIAGNOSIS — R2689 Other abnormalities of gait and mobility: Secondary | ICD-10-CM | POA: Diagnosis not present

## 2020-11-12 DIAGNOSIS — Z8673 Personal history of transient ischemic attack (TIA), and cerebral infarction without residual deficits: Secondary | ICD-10-CM | POA: Diagnosis not present

## 2020-11-12 DIAGNOSIS — M150 Primary generalized (osteo)arthritis: Secondary | ICD-10-CM | POA: Diagnosis not present

## 2020-11-16 DIAGNOSIS — M5416 Radiculopathy, lumbar region: Secondary | ICD-10-CM | POA: Diagnosis not present

## 2020-11-16 DIAGNOSIS — Z7901 Long term (current) use of anticoagulants: Secondary | ICD-10-CM | POA: Diagnosis not present

## 2020-11-16 DIAGNOSIS — Z8673 Personal history of transient ischemic attack (TIA), and cerebral infarction without residual deficits: Secondary | ICD-10-CM | POA: Diagnosis not present

## 2020-11-16 DIAGNOSIS — M150 Primary generalized (osteo)arthritis: Secondary | ICD-10-CM | POA: Diagnosis not present

## 2020-11-16 DIAGNOSIS — R2689 Other abnormalities of gait and mobility: Secondary | ICD-10-CM | POA: Diagnosis not present

## 2020-11-16 DIAGNOSIS — M069 Rheumatoid arthritis, unspecified: Secondary | ICD-10-CM | POA: Diagnosis not present

## 2020-11-17 ENCOUNTER — Encounter: Payer: Self-pay | Admitting: Family Medicine

## 2020-11-18 DIAGNOSIS — M5416 Radiculopathy, lumbar region: Secondary | ICD-10-CM | POA: Diagnosis not present

## 2020-11-18 DIAGNOSIS — Z7901 Long term (current) use of anticoagulants: Secondary | ICD-10-CM | POA: Diagnosis not present

## 2020-11-18 DIAGNOSIS — Z8673 Personal history of transient ischemic attack (TIA), and cerebral infarction without residual deficits: Secondary | ICD-10-CM | POA: Diagnosis not present

## 2020-11-18 DIAGNOSIS — M069 Rheumatoid arthritis, unspecified: Secondary | ICD-10-CM | POA: Diagnosis not present

## 2020-11-18 DIAGNOSIS — R2689 Other abnormalities of gait and mobility: Secondary | ICD-10-CM | POA: Diagnosis not present

## 2020-11-18 DIAGNOSIS — M150 Primary generalized (osteo)arthritis: Secondary | ICD-10-CM | POA: Diagnosis not present

## 2020-11-23 ENCOUNTER — Telehealth: Payer: Self-pay

## 2020-11-23 DIAGNOSIS — Z8673 Personal history of transient ischemic attack (TIA), and cerebral infarction without residual deficits: Secondary | ICD-10-CM | POA: Diagnosis not present

## 2020-11-23 DIAGNOSIS — M069 Rheumatoid arthritis, unspecified: Secondary | ICD-10-CM | POA: Diagnosis not present

## 2020-11-23 DIAGNOSIS — M150 Primary generalized (osteo)arthritis: Secondary | ICD-10-CM | POA: Diagnosis not present

## 2020-11-23 DIAGNOSIS — R2689 Other abnormalities of gait and mobility: Secondary | ICD-10-CM | POA: Diagnosis not present

## 2020-11-23 DIAGNOSIS — M5416 Radiculopathy, lumbar region: Secondary | ICD-10-CM | POA: Diagnosis not present

## 2020-11-23 DIAGNOSIS — Z7901 Long term (current) use of anticoagulants: Secondary | ICD-10-CM | POA: Diagnosis not present

## 2020-11-23 NOTE — Telephone Encounter (Signed)
Physical Therapist, Laurey Arrow, from [facility] is requesting a verbal to add occupation therapy into pts Home PT visits.  He has some concerns while working with patient on transferring.  His call back number is, [phone].  He will be faxing a request to extend home PT visits.  Thanks

## 2020-11-23 NOTE — Telephone Encounter (Signed)
Verbal order given awaiting on written order.

## 2020-11-24 ENCOUNTER — Telehealth: Payer: Self-pay

## 2020-11-24 NOTE — Telephone Encounter (Signed)
Tammy from [facility] is calling Liane Comber or Drue Dun because one of the visits pulled was a phone visit and Medicare won't cover. They just found this out and have 3 days to correct. She is asking for a call back at [phone] and requested it be marked urgent.  Thank you

## 2020-11-25 ENCOUNTER — Telehealth (INDEPENDENT_AMBULATORY_CARE_PROVIDER_SITE_OTHER): Payer: Medicare Other | Admitting: Family Medicine

## 2020-11-25 ENCOUNTER — Encounter: Payer: Self-pay | Admitting: Family Medicine

## 2020-11-25 VITALS — BP 120/65 | HR 66 | Temp 97.1°F | Ht 66.0 in

## 2020-11-25 DIAGNOSIS — R2689 Other abnormalities of gait and mobility: Secondary | ICD-10-CM | POA: Diagnosis not present

## 2020-11-25 DIAGNOSIS — Z8673 Personal history of transient ischemic attack (TIA), and cerebral infarction without residual deficits: Secondary | ICD-10-CM | POA: Diagnosis not present

## 2020-11-25 DIAGNOSIS — Z7901 Long term (current) use of anticoagulants: Secondary | ICD-10-CM | POA: Diagnosis not present

## 2020-11-25 DIAGNOSIS — M5416 Radiculopathy, lumbar region: Secondary | ICD-10-CM | POA: Diagnosis not present

## 2020-11-25 DIAGNOSIS — R269 Unspecified abnormalities of gait and mobility: Secondary | ICD-10-CM

## 2020-11-25 DIAGNOSIS — I6300 Cerebral infarction due to thrombosis of unspecified precerebral artery: Secondary | ICD-10-CM

## 2020-11-25 DIAGNOSIS — M159 Polyosteoarthritis, unspecified: Secondary | ICD-10-CM

## 2020-11-25 DIAGNOSIS — M8949 Other hypertrophic osteoarthropathy, multiple sites: Secondary | ICD-10-CM

## 2020-11-25 DIAGNOSIS — M150 Primary generalized (osteo)arthritis: Secondary | ICD-10-CM | POA: Diagnosis not present

## 2020-11-25 DIAGNOSIS — M069 Rheumatoid arthritis, unspecified: Secondary | ICD-10-CM | POA: Diagnosis not present

## 2020-11-25 NOTE — Progress Notes (Signed)
Virtual Visit via Video Note  I connected with Brian [name] on 11/25/20 at  1:30 PM EDT by a video enabled telemedicine application and verified that I am speaking with the correct person using two identifiers. Location patient: home Location provider: work Persons participating in the virtual visit: patient, provider  I discussed the limitations of evaluation and management by telemedicine and the availability of in person appointments. The patient expressed understanding and agreed to proceed.  Chief Complaint  Patient presents with  . Follow-up    Pt need a referral for PT.  It will be from [facility]     HPI: Brian [name] is a 87 y.o. male patient of my colleague Dr. Ethelene Hal who is seen by me today at the request of [facility] who is providing home PT and OT services to the patient. A referral is requested due to pts h/o CVA, gait disturbance, lumbar radiculopathy.  OT needs order for rolling toilet seat with 4 brakes. OT 2x/wk x 2 wks.  Past Medical History:  Diagnosis Date  . Anemia   . Atrial flutter ([place])   . BPH (benign prostatic hypertrophy)   . Epistaxis   . Hemorrhage of gastrointestinal tract, unspecified   . History of open heart surgery   . Hypertension   . Obesity (BMI 30.0-34.9) 06/29/2020  . Osteoarthrosis, unspecified whether generalized or localized, unspecified site   . Personal history of venous thrombosis and embolism   . Rosacea   . Stroke ([place])   . TIA (transient ischemic attack)     Past Surgical History:  Procedure Laterality Date  . APPENDECTOMY    . HERNIA REPAIR    . IR GENERIC HISTORICAL  05/04/2016   IR PERCUTANEOUS ART THROMBECTOMY/INFUSION INTRACRANIAL INC DIAG ANGIO 05/04/2016 [name], MD [facility]  . IR GENERIC HISTORICAL  05/04/2016   IR ANGIO VERTEBRAL SEL SUBCLAVIAN INNOMINATE UNI R MOD SED 05/04/2016 [name], MD [facility]  . IR GENERIC HISTORICAL  06/07/2016   IR RADIOLOGIST EVAL &  MGMT 06/07/2016 [facility]  . JOINT REPLACEMENT    . MITRAL VALVE REPAIR    . mvp repair    . RADIOLOGY WITH ANESTHESIA N/A 05/04/2016   Procedure: RADIOLOGY WITH ANESTHESIA;  Surgeon: [name], MD;  Location: [facility];  Service: Radiology;  Laterality: N/A;  . TOTAL HIP ARTHROPLASTY      Family History  Problem Relation Age of Onset  . Rheumatic fever Mother   . Coronary artery disease Father     Social History   Tobacco Use  . Smoking status: Former Research scientist (life sciences)  . Smokeless tobacco: Never Used  . Tobacco comment: quit 50 yr ago  Vaping Use  . Vaping Use: Never used  Substance Use Topics  . Alcohol use: No  . Drug use: No     Current Outpatient Medications:  .  acetaminophen (TYLENOL) 500 MG tablet, Take 500 mg by mouth every 6 (six) hours as needed., Disp: , Rfl:  .  ALPRAZolam (XANAX) 0.25 MG tablet, TAKE 1 TABLET BY MOUTH EVERY NIGHT AT BEDTIME, Disp: 90 tablet, Rfl: 1 .  cetirizine (ZYRTEC) 10 MG tablet, TAKE 1 TABLET(10 MG) BY MOUTH AT BEDTIME, Disp: 90 tablet, Rfl: 2 .  chlorhexidine (PERIDEX) 0.12 % solution, chlorhexidine gluconate 0.12 % mouthwash  SWISH GENTLY WITH 1/2 CAPFUL FOR 30 SECONDS TWICE DAILY AFTER EATING., Disp: , Rfl:  .  clotrimazole (LOTRIMIN AF) 1 % cream, Apply 1 application topically 2 (two) times  daily., Disp: 30 g, Rfl: 0 .  doxycycline (VIBRA-TABS) 100 MG tablet, Take 1 tablet (100 mg total) by mouth 2 (two) times daily., Disp: 20 tablet, Rfl: 0 .  doxycycline (VIBRA-TABS) 100 MG tablet, Take 1 tablet (100mg ) by mouth twice daily, Disp: 20 tablet, Rfl: 0 .  ELIQUIS 2.5 MG TABS tablet, TAKE 1 TABLET(2.5 MG) BY MOUTH TWICE DAILY, Disp: 180 tablet, Rfl: 1 .  finasteride (PROSCAR) 5 MG tablet, TAKE 1 TABLET(5 MG) BY MOUTH DAILY, Disp: 90 tablet, Rfl: 3 .  fluticasone (FLONASE) 50 MCG/ACT nasal spray, fluticasone propionate 50 mcg/actuation nasal spray,suspension, Disp: , Rfl:  .  hydrocortisone (ANUCORT-HC) 25 MG suppository, UNWRAP AND INSERT 1  SUPPOSITORY RECTALLY TWICE DAILY, Disp: 30 suppository, Rfl: 10 .  hydrocortisone (PROCTOZONE-HC) 2.5 % rectal cream, USE RECTALLY TWICE DAILY, Disp: 30 g, Rfl: 0 .  hydrocortisone-pramoxine (ANALPRAM-HC) 2.5-1 % rectal cream, Place 1 application rectally 2 (two) times daily as needed for hemorrhoids. , Disp: , Rfl:  .  lidocaine (LIDODERM) 5 %, APPLY 1 PATCH TOPICALLY ONCE DAILY.MAY WEAR UPTO 12 HOURS MAXIMUM IN A DAY., Disp: , Rfl:  .  metoprolol succinate (TOPROL-XL) 25 MG 24 hr tablet, TAKE 1 TABLET(25 MG) BY MOUTH DAILY, Disp: 90 tablet, Rfl: 3 .  Multiple Vitamin (MULTIVITAMIN) tablet, Take 2 tablets by mouth daily., Disp: , Rfl:  .  oxyCODONE-acetaminophen (PERCOCET/ROXICET) 5-325 MG tablet, Take 1 tablet by mouth 2 (two) times daily., Disp: 180 tablet, Rfl: 0 .  oxyCODONE-acetaminophen (PERCOCET/ROXICET) 5-325 MG tablet, Take 1 tablet by mouth 2 (two) times daily as needed for severe pain., Disp: 60 tablet, Rfl: 0 .  pantoprazole (PROTONIX) 40 MG tablet, TAKE 1 TABLET(40 MG) BY MOUTH DAILY, Disp: 90 tablet, Rfl: 0 .  polyethylene glycol powder (GLYCOLAX/MIRALAX) 17 GM/SCOOP powder, Mix one scoop with water and take daily until stools loosen. May repeat as needed., Disp: 850 g, Rfl: 1 .  pravastatin (PRAVACHOL) 40 MG tablet, TAKE 1 TABLET(40 MG) BY MOUTH DAILY, Disp: 90 tablet, Rfl: 3 .  sertraline (ZOLOFT) 50 MG tablet, TAKE 1 TABLET(50 MG) BY MOUTH EVERY MORNING, Disp: 90 tablet, Rfl: 3 .  Silver (SILVASORB) GEL, Apply daily to wound to keep moist., Disp: 44.4 mL, Rfl: 0 .  losartan (COZAAR) 25 MG tablet, Take 1 tablet (25 mg total) by mouth daily., Disp: 90 tablet, Rfl: 3  Allergies  Allergen Reactions  . Novocain [Procaine Hcl]     Irregular heart beat     . Other     Patient had problem with epidural with his right hip surgery. Went to up extremity instead of lower extremity   . Penicillins Itching    Has patient had a PCN reaction causing immediate rash, facial/tongue/throat  swelling, SOB or lightheadedness with hypotension: Unk Has patient had a PCN reaction causing severe rash involving mucus membranes or skin necrosis: Unk Has patient had a PCN reaction that required hospitalization: Unk Has patient had a PCN reaction occurring within the last 10 years: No If all of the above answers are "NO", then may proceed with Cephalosporin use.  No reaction noted      ROS: See pertinent positives and negatives per HPI.   EXAM:  VITALS per patient if applicable: BP 650/35 (BP Location: Right Arm, Patient Position: Sitting, Cuff Size: Normal)   Pulse 66   Temp (!) 97.1 F (36.2 C) (Temporal)   Ht 5\' 6"  (1.676 m)   BMI 30.51 kg/m    GENERAL: alert, oriented, in no acute  distress  NECK: normal movements of the head and neck  LUNGS: on inspection no signs of respiratory distress, breathing rate appears normal, no obvious gross SOB, gasping or wheezing, no conversational dyspnea  CV: no obvious cyanosis  PSYCH/NEURO: pleasant and cooperative, speech is difficult to understand at times due to dysarthria s/p CVA   ASSESSMENT AND PLAN:  1. Gait disturbance 2. Primary osteoarthritis involving multiple joints 3. Cerebrovascular accident (CVA) due to thrombosis of precerebral artery ([place]) 4. Lumbar radiculopathy - Ambulatory referral to Home Health - For home use only DME Other see comment     I discussed the assessment and treatment plan with the patient. The patient was provided an opportunity to ask questions and all were answered. The patient agreed with the plan and demonstrated an understanding of the instructions.   The patient was advised to call back or seek an in-person evaluation if the symptoms worsen or if the condition fails to improve as anticipated.   Letta Median, DO

## 2020-11-25 NOTE — Telephone Encounter (Signed)
I called and got this handled, pt is set for a video visit today with Dr Bryan Lemma

## 2020-11-25 NOTE — Telephone Encounter (Signed)
Pt will discuss during [place] visit w/ Dr. Loletha Grayer today.

## 2020-11-28 DIAGNOSIS — R2689 Other abnormalities of gait and mobility: Secondary | ICD-10-CM | POA: Diagnosis not present

## 2020-11-28 DIAGNOSIS — M5416 Radiculopathy, lumbar region: Secondary | ICD-10-CM | POA: Diagnosis not present

## 2020-11-28 DIAGNOSIS — M069 Rheumatoid arthritis, unspecified: Secondary | ICD-10-CM | POA: Diagnosis not present

## 2020-11-28 DIAGNOSIS — Z7901 Long term (current) use of anticoagulants: Secondary | ICD-10-CM | POA: Diagnosis not present

## 2020-11-28 DIAGNOSIS — M150 Primary generalized (osteo)arthritis: Secondary | ICD-10-CM | POA: Diagnosis not present

## 2020-11-28 DIAGNOSIS — Z8673 Personal history of transient ischemic attack (TIA), and cerebral infarction without residual deficits: Secondary | ICD-10-CM | POA: Diagnosis not present

## 2020-11-30 ENCOUNTER — Telehealth: Payer: Self-pay

## 2020-11-30 ENCOUNTER — Ambulatory Visit: Payer: Medicare Other | Admitting: Family Medicine

## 2020-11-30 NOTE — Telephone Encounter (Signed)
Pts son is requesting a refill for doxycycline (VIBRA-TABS) 100 MG tablet [[id number]]    Pharmacy is [facility] on [address], [place]

## 2020-12-02 DIAGNOSIS — Z7901 Long term (current) use of anticoagulants: Secondary | ICD-10-CM | POA: Diagnosis not present

## 2020-12-02 DIAGNOSIS — Z8673 Personal history of transient ischemic attack (TIA), and cerebral infarction without residual deficits: Secondary | ICD-10-CM | POA: Diagnosis not present

## 2020-12-02 DIAGNOSIS — M150 Primary generalized (osteo)arthritis: Secondary | ICD-10-CM | POA: Diagnosis not present

## 2020-12-02 DIAGNOSIS — M069 Rheumatoid arthritis, unspecified: Secondary | ICD-10-CM | POA: Diagnosis not present

## 2020-12-02 DIAGNOSIS — M5416 Radiculopathy, lumbar region: Secondary | ICD-10-CM | POA: Diagnosis not present

## 2020-12-02 DIAGNOSIS — R2689 Other abnormalities of gait and mobility: Secondary | ICD-10-CM | POA: Diagnosis not present

## 2020-12-03 DIAGNOSIS — M150 Primary generalized (osteo)arthritis: Secondary | ICD-10-CM | POA: Diagnosis not present

## 2020-12-03 DIAGNOSIS — Z8673 Personal history of transient ischemic attack (TIA), and cerebral infarction without residual deficits: Secondary | ICD-10-CM | POA: Diagnosis not present

## 2020-12-03 DIAGNOSIS — M069 Rheumatoid arthritis, unspecified: Secondary | ICD-10-CM | POA: Diagnosis not present

## 2020-12-03 DIAGNOSIS — R2689 Other abnormalities of gait and mobility: Secondary | ICD-10-CM | POA: Diagnosis not present

## 2020-12-03 DIAGNOSIS — M5416 Radiculopathy, lumbar region: Secondary | ICD-10-CM | POA: Diagnosis not present

## 2020-12-03 DIAGNOSIS — Z7901 Long term (current) use of anticoagulants: Secondary | ICD-10-CM | POA: Diagnosis not present

## 2020-12-06 ENCOUNTER — Telehealth: Payer: Self-pay | Admitting: Family Medicine

## 2020-12-06 NOTE — Telephone Encounter (Signed)
Pt son calling and said pt needs refill on Doxycycline, He said he needs it as soon as possible

## 2020-12-06 NOTE — Telephone Encounter (Signed)
Appointment 12/07/20 for follow up on medication.

## 2020-12-07 ENCOUNTER — Other Ambulatory Visit: Payer: Self-pay

## 2020-12-07 ENCOUNTER — Ambulatory Visit (INDEPENDENT_AMBULATORY_CARE_PROVIDER_SITE_OTHER): Payer: Medicare Other | Admitting: Family Medicine

## 2020-12-07 ENCOUNTER — Encounter: Payer: Self-pay | Admitting: Family Medicine

## 2020-12-07 VITALS — BP 138/72 | HR 74 | Temp 98.3°F | Ht 66.0 in | Wt 172.0 lb

## 2020-12-07 DIAGNOSIS — M5416 Radiculopathy, lumbar region: Secondary | ICD-10-CM

## 2020-12-07 DIAGNOSIS — R5381 Other malaise: Secondary | ICD-10-CM | POA: Diagnosis not present

## 2020-12-07 DIAGNOSIS — M623 Immobility syndrome (paraplegic): Secondary | ICD-10-CM | POA: Diagnosis not present

## 2020-12-07 DIAGNOSIS — R5383 Other fatigue: Secondary | ICD-10-CM

## 2020-12-07 DIAGNOSIS — E785 Hyperlipidemia, unspecified: Secondary | ICD-10-CM | POA: Diagnosis not present

## 2020-12-07 DIAGNOSIS — R7309 Other abnormal glucose: Secondary | ICD-10-CM

## 2020-12-07 DIAGNOSIS — I10 Essential (primary) hypertension: Secondary | ICD-10-CM

## 2020-12-07 DIAGNOSIS — M19012 Primary osteoarthritis, left shoulder: Secondary | ICD-10-CM | POA: Diagnosis not present

## 2020-12-07 DIAGNOSIS — M169 Osteoarthritis of hip, unspecified: Secondary | ICD-10-CM

## 2020-12-07 MED ORDER — OXYCODONE-ACETAMINOPHEN 5-325 MG PO TABS: 1.0000 | ORAL_TABLET | Freq: Two times a day (BID) | ORAL | 0 refills | Status: DC | PRN

## 2020-12-07 NOTE — Progress Notes (Signed)
Established Patient Office Visit  Subjective:  Patient ID: Brian [name], male    DOB: Aug 17, 1933  Age: 87 y.o. MRN: [id number]  CC:  Chief Complaint  Patient presents with  . Follow-up    Follow up on medications. Refill on Doxycycline and oxycodone.     HPI Brian [name] presents for follow-up of hypertension.  Losartan at low dose was recently started.  He is tolerating the medicine.  He has chronic pain in his left shoulder lower back hips and left knee.  He is taking oxycodone twice daily as needed for this pain.  He is accompanied by his son and caregiver and they feel also that this medicine is required for him.  He is debilitated.  He has been able to lose some weight which is good.  It has been difficult for him to do so.  Thyroid has not been checked in a few years.  He requests a refill of his doxycycline for nasal polyposis.  Explained that [place] he has been prescribed the fluticasone and the please use it.  Visiting home health has been checking his pressure sore on the sacrum.   Past Medical History:  Diagnosis Date  . Anemia   . Atrial flutter ([place])   . BPH (benign prostatic hypertrophy)   . Epistaxis   . Hemorrhage of gastrointestinal tract, unspecified   . History of open heart surgery   . Hypertension   . Obesity (BMI 30.0-34.9) 06/29/2020  . Osteoarthrosis, unspecified whether generalized or localized, unspecified site   . Personal history of venous thrombosis and embolism   . Rosacea   . Stroke ([place])   . TIA (transient ischemic attack)     Past Surgical History:  Procedure Laterality Date  . APPENDECTOMY    . HERNIA REPAIR    . IR GENERIC HISTORICAL  05/04/2016   IR PERCUTANEOUS ART THROMBECTOMY/INFUSION INTRACRANIAL INC DIAG ANGIO 05/04/2016 [name], MD [facility]  . IR GENERIC HISTORICAL  05/04/2016   IR ANGIO VERTEBRAL SEL SUBCLAVIAN INNOMINATE UNI R MOD SED 05/04/2016 [name], MD [facility]  . IR GENERIC HISTORICAL  06/07/2016    IR RADIOLOGIST EVAL & MGMT 06/07/2016 [facility]  . JOINT REPLACEMENT    . MITRAL VALVE REPAIR    . mvp repair    . RADIOLOGY WITH ANESTHESIA N/A 05/04/2016   Procedure: RADIOLOGY WITH ANESTHESIA;  Surgeon: [name], MD;  Location: [facility];  Service: Radiology;  Laterality: N/A;  . TOTAL HIP ARTHROPLASTY      Family History  Problem Relation Age of Onset  . Rheumatic fever Mother   . Coronary artery disease Father     Social History   Socioeconomic History  . Marital status: Married    Spouse name: Not on file  . Number of children: Not on file  . Years of education: Not on file  . Highest education level: Not on file  Occupational History  . Not on file  Tobacco Use  . Smoking status: Former Research scientist (life sciences)  . Smokeless tobacco: Never Used  . Tobacco comment: quit 50 yr ago  Vaping Use  . Vaping Use: Never used  Substance and Sexual Activity  . Alcohol use: No  . Drug use: No  . Sexual activity: Not on file  Other Topics Concern  . Not on file  Social History Narrative  . Not on file   Social Determinants of Health   Financial Resource Strain: Low Risk   . Difficulty of Paying  Living Expenses: Not hard at all  Food Insecurity: Not on file  Transportation Needs: No Transportation Needs  . Lack of Transportation (Medical): No  . Lack of Transportation (Non-Medical): No  Physical Activity: Not on file  Stress: Not on file  Social Connections: Not on file  Intimate Partner Violence: Not on file    Outpatient Medications Prior to Visit  Medication Sig Dispense Refill  . acetaminophen (TYLENOL) 500 MG tablet Take 500 mg by mouth every 6 (six) hours as needed.    . ALPRAZolam (XANAX) 0.25 MG tablet TAKE 1 TABLET BY MOUTH EVERY NIGHT AT BEDTIME 90 tablet 1  . chlorhexidine (PERIDEX) 0.12 % solution chlorhexidine gluconate 0.12 % mouthwash  SWISH GENTLY WITH 1/2 CAPFUL FOR 30 SECONDS TWICE DAILY AFTER EATING.    . ELIQUIS 2.5 MG TABS tablet TAKE 1 TABLET(2.5 MG) BY  MOUTH TWICE DAILY 180 tablet 1  . finasteride (PROSCAR) 5 MG tablet TAKE 1 TABLET(5 MG) BY MOUTH DAILY 90 tablet 3  . fluticasone (FLONASE) 50 MCG/ACT nasal spray fluticasone propionate 50 mcg/actuation nasal spray,suspension    . hydrocortisone-pramoxine (ANALPRAM-HC) 2.5-1 % rectal cream Place 1 application rectally 2 (two) times daily as needed for hemorrhoids.     . lidocaine (LIDODERM) 5 % APPLY 1 PATCH TOPICALLY ONCE DAILY.MAY WEAR UPTO 12 HOURS MAXIMUM IN A DAY.    Marland Kitchen losartan (COZAAR) 25 MG tablet Take 1 tablet (25 mg total) by mouth daily. 90 tablet 3  . metoprolol succinate (TOPROL-XL) 25 MG 24 hr tablet TAKE 1 TABLET(25 MG) BY MOUTH DAILY 90 tablet 3  . Multiple Vitamin (MULTIVITAMIN) tablet Take 2 tablets by mouth daily.    Marland Kitchen oxyCODONE-acetaminophen (PERCOCET/ROXICET) 5-325 MG tablet Take 1 tablet by mouth 2 (two) times daily. 180 tablet 0  . pantoprazole (PROTONIX) 40 MG tablet TAKE 1 TABLET(40 MG) BY MOUTH DAILY 90 tablet 0  . polyethylene glycol powder (GLYCOLAX/MIRALAX) 17 GM/SCOOP powder Mix one scoop with water and take daily until stools loosen. May repeat as needed. 850 g 1  . pravastatin (PRAVACHOL) 40 MG tablet TAKE 1 TABLET(40 MG) BY MOUTH DAILY 90 tablet 3  . sertraline (ZOLOFT) 50 MG tablet TAKE 1 TABLET(50 MG) BY MOUTH EVERY MORNING 90 tablet 3  . oxyCODONE-acetaminophen (PERCOCET/ROXICET) 5-325 MG tablet Take 1 tablet by mouth 2 (two) times daily as needed for severe pain. 60 tablet 0  . cetirizine (ZYRTEC) 10 MG tablet TAKE 1 TABLET(10 MG) BY MOUTH AT BEDTIME 90 tablet 2  . clotrimazole (LOTRIMIN AF) 1 % cream Apply 1 application topically 2 (two) times daily. (Patient not taking: Reported on 12/07/2020) 30 g 0  . hydrocortisone (ANUCORT-HC) 25 MG suppository UNWRAP AND INSERT 1 SUPPOSITORY RECTALLY TWICE DAILY (Patient not taking: Reported on 12/07/2020) 30 suppository 10  . hydrocortisone (PROCTOZONE-HC) 2.5 % rectal cream USE RECTALLY TWICE DAILY (Patient not taking:  Reported on 12/07/2020) 30 g 0  . doxycycline (VIBRA-TABS) 100 MG tablet Take 1 tablet (100 mg total) by mouth 2 (two) times daily. (Patient not taking: Reported on 12/07/2020) 20 tablet 0  . doxycycline (VIBRA-TABS) 100 MG tablet Take 1 tablet (100mg ) by mouth twice daily (Patient not taking: Reported on 12/07/2020) 20 tablet 0  . Silver (SILVASORB) GEL Apply daily to wound to keep moist. (Patient not taking: Reported on 12/07/2020) 44.4 mL 0   No facility-administered medications prior to visit.    Allergies  Allergen Reactions  . Novocain [Procaine Hcl]     Irregular heart beat     .  Other     Patient had problem with epidural with his right hip surgery. Went to up extremity instead of lower extremity   . Penicillins Itching    Has patient had a PCN reaction causing immediate rash, facial/tongue/throat swelling, SOB or lightheadedness with hypotension: Unk Has patient had a PCN reaction causing severe rash involving mucus membranes or skin necrosis: Unk Has patient had a PCN reaction that required hospitalization: Unk Has patient had a PCN reaction occurring within the last 10 years: No If all of the above answers are "NO", then may proceed with Cephalosporin use.  No reaction noted    ROS Review of Systems  Constitutional: Negative.   HENT: Negative.   Eyes: Negative for photophobia and visual disturbance.  Respiratory: Negative.   Cardiovascular: Negative.   Gastrointestinal: Negative.   Endocrine: Negative for polyphagia and polyuria.  Genitourinary: Negative.   Musculoskeletal: Positive for arthralgias, back pain, gait problem and myalgias.  Skin: Positive for wound.  Neurological: Negative for weakness.  Psychiatric/Behavioral: Positive for confusion and decreased concentration.      Objective:    Physical Exam  BP 138/72   Pulse 74   Temp 98.3 F (36.8 C) (Temporal)   Ht 5\' 6"  (1.676 m)   Wt 172 lb (78 kg)   SpO2 94%   BMI 27.76 kg/m  Wt Readings from Last  3 Encounters:  12/07/20 172 lb (78 kg)  05/11/20 189 lb (85.7 kg)  02/06/20 185 lb (83.9 kg)     Health Maintenance Due  Topic Date Due  . COVID-19 Vaccine (4 - Booster for Pfizer series) 11/28/2020    There are no preventive care reminders to display for this patient.  Lab Results  Component Value Date   TSH 2.99 09/26/2016   Lab Results  Component Value Date   WBC 8.4 11/06/2019   HGB 15.1 11/06/2019   HCT 44.9 11/06/2019   MCV 95.8 11/06/2019   PLT 241.0 11/06/2019   Lab Results  Component Value Date   NA 135 05/11/2020   K 4.6 05/11/2020   CO2 29 05/11/2020   GLUCOSE 96 05/11/2020   BUN 16 05/11/2020   CREATININE 0.68 05/11/2020   BILITOT 0.5 05/11/2020   ALKPHOS 78 05/11/2020   AST 21 05/11/2020   ALT 17 05/11/2020   PROT 6.6 05/11/2020   ALBUMIN 4.1 05/11/2020   CALCIUM 9.2 05/11/2020   ANIONGAP 13 11/03/2018   GFR 110.74 05/11/2020   Lab Results  Component Value Date   CHOL 134 04/16/2018   Lab Results  Component Value Date   HDL 51 04/16/2018   Lab Results  Component Value Date   LDLCALC 55 04/16/2018   Lab Results  Component Value Date   TRIG 139 04/16/2018   Lab Results  Component Value Date   CHOLHDL 2.6 04/16/2018   Lab Results  Component Value Date   HGBA1C 4.9 04/08/2019      Assessment & Plan:   Problem List Items Addressed This Visit      Cardiovascular and Mediastinum   Essential hypertension   Relevant Orders   CBC   Comprehensive metabolic panel     Nervous and Auditory   Lumbar radiculopathy - Primary   Relevant Medications   oxyCODONE-acetaminophen (PERCOCET/ROXICET) 5-325 MG tablet     Musculoskeletal and Integument   Osteoarthritis of hip   Relevant Medications   oxyCODONE-acetaminophen (PERCOCET/ROXICET) 5-325 MG tablet   Osteoarthritis of left glenohumeral joint   Relevant Medications   oxyCODONE-acetaminophen (PERCOCET/ROXICET) 5-325  MG tablet   Paraplegic immobility syndrome   Relevant Medications    oxyCODONE-acetaminophen (PERCOCET/ROXICET) 5-325 MG tablet     Other   Hyperlipidemia   Relevant Orders   Lipid panel   Elevated glucose   Relevant Orders   Hemoglobin A1c   Debilitated    Other Visit Diagnoses    Fatigue, unspecified type       Relevant Orders   TSH      Meds ordered this encounter  Medications  . oxyCODONE-acetaminophen (PERCOCET/ROXICET) 5-325 MG tablet    Sig: Take 1 tablet by mouth 2 (two) times daily as needed for severe pain.    Dispense:  180 tablet    Refill:  0    Follow-up: Return in about 6 months (around 06/08/2021), or if symptoms worsen or fail to improve.    Libby Maw, MD

## 2020-12-07 NOTE — Telephone Encounter (Signed)
OV today to discuss requested medication

## 2020-12-08 LAB — LIPID PANEL
Cholesterol: 123 mg/dL (ref 0–200)
HDL: 51.1 mg/dL (ref >39.00)
LDL Cholesterol: 56 mg/dL (ref 0–99)
NonHDL: 72.16
Total CHOL/HDL Ratio: 2
Triglycerides: 83 mg/dL (ref 0.0–149.0)
VLDL: 16.6 mg/dL (ref 0.0–40.0)

## 2020-12-08 LAB — CBC
HCT: 45.3 % (ref 39.0–52.0)
Hemoglobin: 15.3 g/dL (ref 13.0–17.0)
MCHC: 33.7 g/dL (ref 30.0–36.0)
MCV: 100.6 fl — ABNORMAL HIGH (ref 78.0–100.0)
Platelets: 205 10*3/uL (ref 150.0–400.0)
RBC: 4.5 Mil/uL (ref 4.22–5.81)
RDW: 14.1 % (ref 11.5–15.5)
WBC: 8.2 10*3/uL (ref 4.0–10.5)

## 2020-12-08 LAB — COMPREHENSIVE METABOLIC PANEL
ALT: 23 U/L (ref 0–53)
AST: 18 U/L (ref 0–37)
Albumin: 3.7 g/dL (ref 3.5–5.2)
Alkaline Phosphatase: 59 U/L (ref 39–117)
BUN: 15 mg/dL (ref 6–23)
CO2: 29 mEq/L (ref 19–32)
Calcium: 9 mg/dL (ref 8.4–10.5)
Chloride: 98 mEq/L (ref 96–112)
Creatinine, Ser: 0.47 mg/dL (ref 0.40–1.50)
GFR: 94.49 mL/min (ref >60.00)
Glucose, Bld: 84 mg/dL (ref 70–99)
Potassium: 4.4 mEq/L (ref 3.5–5.1)
Sodium: 134 mEq/L — ABNORMAL LOW (ref 135–145)
Total Bilirubin: 0.5 mg/dL (ref 0.2–1.2)
Total Protein: 5.9 g/dL — ABNORMAL LOW (ref 6.0–8.3)

## 2020-12-08 LAB — TSH: TSH: 1.55 u[IU]/mL (ref 0.35–4.50)

## 2020-12-08 LAB — HEMOGLOBIN A1C: Hgb A1c MFr Bld: 5.1 % (ref 4.6–6.5)

## 2020-12-09 ENCOUNTER — Telehealth: Payer: Self-pay

## 2020-12-09 ENCOUNTER — Telehealth: Payer: Self-pay | Admitting: Family Medicine

## 2020-12-09 DIAGNOSIS — R2689 Other abnormalities of gait and mobility: Secondary | ICD-10-CM | POA: Diagnosis not present

## 2020-12-09 DIAGNOSIS — M5416 Radiculopathy, lumbar region: Secondary | ICD-10-CM | POA: Diagnosis not present

## 2020-12-09 DIAGNOSIS — Z8673 Personal history of transient ischemic attack (TIA), and cerebral infarction without residual deficits: Secondary | ICD-10-CM | POA: Diagnosis not present

## 2020-12-09 DIAGNOSIS — M150 Primary generalized (osteo)arthritis: Secondary | ICD-10-CM | POA: Diagnosis not present

## 2020-12-09 DIAGNOSIS — Z7901 Long term (current) use of anticoagulants: Secondary | ICD-10-CM | POA: Diagnosis not present

## 2020-12-09 DIAGNOSIS — M069 Rheumatoid arthritis, unspecified: Secondary | ICD-10-CM | POA: Diagnosis not present

## 2020-12-09 NOTE — Telephone Encounter (Signed)
Will Schalla,OT needs a verbal that Dr Ethelene Hal discontinued Doxycycline for this patient His call back # is [phone]  Thanks

## 2020-12-09 NOTE — Telephone Encounter (Signed)
Patients daughter is calling to speak to the nurse. She states that patient would like to be prescribed Gabapentin. She also suggested that patient takes Seroquel in the morning for anxiety and the Gabapentin at night (if prescribed). Please call her at [phone] and advise.

## 2020-12-09 NOTE — Telephone Encounter (Signed)
Please advise message below regarding Rx for Gabapentin being sent in.

## 2020-12-09 NOTE — Telephone Encounter (Signed)
Spoke with Will Schalla who verbally  Understood Doxycycline was discontinued by Dr. Ethelene Hal.

## 2020-12-10 ENCOUNTER — Telehealth: Payer: Self-pay | Admitting: Family Medicine

## 2020-12-10 DIAGNOSIS — Z7901 Long term (current) use of anticoagulants: Secondary | ICD-10-CM | POA: Diagnosis not present

## 2020-12-10 DIAGNOSIS — M150 Primary generalized (osteo)arthritis: Secondary | ICD-10-CM | POA: Diagnosis not present

## 2020-12-10 DIAGNOSIS — R2689 Other abnormalities of gait and mobility: Secondary | ICD-10-CM | POA: Diagnosis not present

## 2020-12-10 DIAGNOSIS — M069 Rheumatoid arthritis, unspecified: Secondary | ICD-10-CM | POA: Diagnosis not present

## 2020-12-10 DIAGNOSIS — Z8673 Personal history of transient ischemic attack (TIA), and cerebral infarction without residual deficits: Secondary | ICD-10-CM | POA: Diagnosis not present

## 2020-12-10 DIAGNOSIS — M5416 Radiculopathy, lumbar region: Secondary | ICD-10-CM | POA: Diagnosis not present

## 2020-12-10 NOTE — Telephone Encounter (Signed)
done

## 2020-12-10 NOTE — Telephone Encounter (Signed)
Patients daughter aware of message below and understands.

## 2020-12-10 NOTE — Telephone Encounter (Signed)
I do appreciate the input but Brian [name] medical regimen is already complicated enough without adding to it. His son tells me that current meds are working well for him.

## 2020-12-10 NOTE — Telephone Encounter (Signed)
[facility] is calling to get verbal orders to continue therapy 1 time a week for 2 weeks. Please call him back at  [phone].

## 2020-12-15 ENCOUNTER — Telehealth: Payer: Self-pay | Admitting: Family Medicine

## 2020-12-15 NOTE — Telephone Encounter (Signed)
Please advise message below  °

## 2020-12-15 NOTE — Telephone Encounter (Signed)
The family of Brian [name] is wanting to know if he can have a referral for  aquatic therapy through the [facility] system. Please advise  Roxanne(daughter) at [phone].

## 2020-12-15 NOTE — Telephone Encounter (Signed)
Okay 

## 2020-12-16 ENCOUNTER — Other Ambulatory Visit: Payer: Self-pay

## 2020-12-16 DIAGNOSIS — M623 Immobility syndrome (paraplegic): Secondary | ICD-10-CM

## 2020-12-16 DIAGNOSIS — Z7901 Long term (current) use of anticoagulants: Secondary | ICD-10-CM | POA: Diagnosis not present

## 2020-12-16 DIAGNOSIS — M5416 Radiculopathy, lumbar region: Secondary | ICD-10-CM | POA: Diagnosis not present

## 2020-12-16 DIAGNOSIS — Z8673 Personal history of transient ischemic attack (TIA), and cerebral infarction without residual deficits: Secondary | ICD-10-CM | POA: Diagnosis not present

## 2020-12-16 DIAGNOSIS — M069 Rheumatoid arthritis, unspecified: Secondary | ICD-10-CM | POA: Diagnosis not present

## 2020-12-16 DIAGNOSIS — M150 Primary generalized (osteo)arthritis: Secondary | ICD-10-CM | POA: Diagnosis not present

## 2020-12-16 DIAGNOSIS — R2689 Other abnormalities of gait and mobility: Secondary | ICD-10-CM | POA: Diagnosis not present

## 2020-12-16 NOTE — Telephone Encounter (Signed)
Patients daughter aware that referral placed.

## 2020-12-20 ENCOUNTER — Other Ambulatory Visit: Payer: Self-pay | Admitting: Family Medicine

## 2020-12-20 DIAGNOSIS — F419 Anxiety disorder, unspecified: Secondary | ICD-10-CM

## 2020-12-23 ENCOUNTER — Encounter: Payer: Self-pay | Admitting: Family Medicine

## 2020-12-23 DIAGNOSIS — M5416 Radiculopathy, lumbar region: Secondary | ICD-10-CM | POA: Diagnosis not present

## 2020-12-23 DIAGNOSIS — R2689 Other abnormalities of gait and mobility: Secondary | ICD-10-CM | POA: Diagnosis not present

## 2020-12-23 DIAGNOSIS — Z8673 Personal history of transient ischemic attack (TIA), and cerebral infarction without residual deficits: Secondary | ICD-10-CM | POA: Diagnosis not present

## 2020-12-23 DIAGNOSIS — M150 Primary generalized (osteo)arthritis: Secondary | ICD-10-CM | POA: Diagnosis not present

## 2020-12-23 DIAGNOSIS — Z7901 Long term (current) use of anticoagulants: Secondary | ICD-10-CM | POA: Diagnosis not present

## 2020-12-23 DIAGNOSIS — M069 Rheumatoid arthritis, unspecified: Secondary | ICD-10-CM | POA: Diagnosis not present

## 2020-12-23 NOTE — Telephone Encounter (Signed)
Patient states that he wants to use [facility] for his PT.

## 2020-12-24 ENCOUNTER — Other Ambulatory Visit: Payer: Self-pay

## 2020-12-24 DIAGNOSIS — M623 Immobility syndrome (paraplegic): Secondary | ICD-10-CM

## 2020-12-27 ENCOUNTER — Encounter: Payer: Self-pay | Admitting: Family Medicine

## 2020-12-27 NOTE — Telephone Encounter (Signed)
Tried to call pts daughter based on message to [place] to confirm, lvm for her to return call

## 2020-12-28 DIAGNOSIS — M150 Primary generalized (osteo)arthritis: Secondary | ICD-10-CM | POA: Diagnosis not present

## 2020-12-28 DIAGNOSIS — I69322 Dysarthria following cerebral infarction: Secondary | ICD-10-CM | POA: Diagnosis not present

## 2020-12-28 DIAGNOSIS — Z7901 Long term (current) use of anticoagulants: Secondary | ICD-10-CM | POA: Diagnosis not present

## 2020-12-28 DIAGNOSIS — M5416 Radiculopathy, lumbar region: Secondary | ICD-10-CM | POA: Diagnosis not present

## 2020-12-28 DIAGNOSIS — R2689 Other abnormalities of gait and mobility: Secondary | ICD-10-CM | POA: Diagnosis not present

## 2020-12-28 DIAGNOSIS — M069 Rheumatoid arthritis, unspecified: Secondary | ICD-10-CM | POA: Diagnosis not present

## 2020-12-29 ENCOUNTER — Telehealth: Payer: Self-pay | Admitting: Family Medicine

## 2020-12-29 NOTE — Telephone Encounter (Signed)
Pts assistant asked if Brian [name] can get a call back from his nurse at [phone], Just ask for [name].

## 2020-12-30 DIAGNOSIS — M150 Primary generalized (osteo)arthritis: Secondary | ICD-10-CM | POA: Diagnosis not present

## 2020-12-30 DIAGNOSIS — I69322 Dysarthria following cerebral infarction: Secondary | ICD-10-CM | POA: Diagnosis not present

## 2020-12-30 DIAGNOSIS — M5416 Radiculopathy, lumbar region: Secondary | ICD-10-CM | POA: Diagnosis not present

## 2020-12-30 DIAGNOSIS — R2689 Other abnormalities of gait and mobility: Secondary | ICD-10-CM | POA: Diagnosis not present

## 2020-12-30 DIAGNOSIS — Z7901 Long term (current) use of anticoagulants: Secondary | ICD-10-CM | POA: Diagnosis not present

## 2020-12-30 DIAGNOSIS — M069 Rheumatoid arthritis, unspecified: Secondary | ICD-10-CM | POA: Diagnosis not present

## 2020-12-31 DIAGNOSIS — I10 Essential (primary) hypertension: Secondary | ICD-10-CM | POA: Diagnosis not present

## 2020-12-31 DIAGNOSIS — Z7901 Long term (current) use of anticoagulants: Secondary | ICD-10-CM | POA: Diagnosis not present

## 2020-12-31 DIAGNOSIS — I4891 Unspecified atrial fibrillation: Secondary | ICD-10-CM | POA: Diagnosis not present

## 2020-12-31 DIAGNOSIS — J019 Acute sinusitis, unspecified: Secondary | ICD-10-CM | POA: Diagnosis not present

## 2021-01-02 ENCOUNTER — Other Ambulatory Visit: Payer: Self-pay | Admitting: Family

## 2021-01-02 DIAGNOSIS — K219 Gastro-esophageal reflux disease without esophagitis: Secondary | ICD-10-CM

## 2021-01-04 ENCOUNTER — Ambulatory Visit: Payer: Medicare Other

## 2021-01-04 DIAGNOSIS — Z23 Encounter for immunization: Secondary | ICD-10-CM | POA: Diagnosis not present

## 2021-01-07 DIAGNOSIS — Z7901 Long term (current) use of anticoagulants: Secondary | ICD-10-CM | POA: Diagnosis not present

## 2021-01-07 DIAGNOSIS — M1990 Unspecified osteoarthritis, unspecified site: Secondary | ICD-10-CM | POA: Diagnosis not present

## 2021-01-07 DIAGNOSIS — Z7401 Bed confinement status: Secondary | ICD-10-CM | POA: Diagnosis not present

## 2021-01-07 DIAGNOSIS — I10 Essential (primary) hypertension: Secondary | ICD-10-CM | POA: Diagnosis not present

## 2021-01-08 DIAGNOSIS — M5416 Radiculopathy, lumbar region: Secondary | ICD-10-CM | POA: Diagnosis not present

## 2021-01-08 DIAGNOSIS — M150 Primary generalized (osteo)arthritis: Secondary | ICD-10-CM | POA: Diagnosis not present

## 2021-01-08 DIAGNOSIS — R2689 Other abnormalities of gait and mobility: Secondary | ICD-10-CM | POA: Diagnosis not present

## 2021-01-08 DIAGNOSIS — I69322 Dysarthria following cerebral infarction: Secondary | ICD-10-CM | POA: Diagnosis not present

## 2021-01-08 DIAGNOSIS — M069 Rheumatoid arthritis, unspecified: Secondary | ICD-10-CM | POA: Diagnosis not present

## 2021-01-08 DIAGNOSIS — Z7901 Long term (current) use of anticoagulants: Secondary | ICD-10-CM | POA: Diagnosis not present

## 2021-01-10 ENCOUNTER — Telehealth: Payer: Self-pay | Admitting: Family Medicine

## 2021-01-10 NOTE — Telephone Encounter (Signed)
[facility] calling [phone] Andee Poles, medical record specialist, Calling to check on orders sent over to Dr Ethelene Hal to see if they are done. Requested call back

## 2021-01-13 ENCOUNTER — Encounter: Payer: Self-pay | Admitting: Family Medicine

## 2021-01-13 DIAGNOSIS — I69322 Dysarthria following cerebral infarction: Secondary | ICD-10-CM | POA: Diagnosis not present

## 2021-01-13 DIAGNOSIS — M150 Primary generalized (osteo)arthritis: Secondary | ICD-10-CM | POA: Diagnosis not present

## 2021-01-13 DIAGNOSIS — M5416 Radiculopathy, lumbar region: Secondary | ICD-10-CM | POA: Diagnosis not present

## 2021-01-13 DIAGNOSIS — M069 Rheumatoid arthritis, unspecified: Secondary | ICD-10-CM | POA: Diagnosis not present

## 2021-01-13 DIAGNOSIS — R2689 Other abnormalities of gait and mobility: Secondary | ICD-10-CM | POA: Diagnosis not present

## 2021-01-13 DIAGNOSIS — Z7901 Long term (current) use of anticoagulants: Secondary | ICD-10-CM | POA: Diagnosis not present

## 2021-01-17 ENCOUNTER — Encounter: Payer: Self-pay | Admitting: Family Medicine

## 2021-01-19 NOTE — Telephone Encounter (Signed)
Done

## 2021-01-25 ENCOUNTER — Encounter: Payer: Self-pay | Admitting: Physical Therapy

## 2021-01-25 ENCOUNTER — Ambulatory Visit: Payer: Medicare Other | Attending: Family Medicine | Admitting: Physical Therapy

## 2021-01-25 ENCOUNTER — Other Ambulatory Visit: Payer: Self-pay

## 2021-01-25 DIAGNOSIS — I69354 Hemiplegia and hemiparesis following cerebral infarction affecting left non-dominant side: Secondary | ICD-10-CM | POA: Diagnosis not present

## 2021-01-25 DIAGNOSIS — R29818 Other symptoms and signs involving the nervous system: Secondary | ICD-10-CM | POA: Diagnosis not present

## 2021-01-25 DIAGNOSIS — R2689 Other abnormalities of gait and mobility: Secondary | ICD-10-CM | POA: Insufficient documentation

## 2021-01-25 NOTE — Therapy (Signed)
[facility] [address] [place], [place], [address] Phone: [phone]   Fax:  [phone]  Physical Therapy Evaluation  Patient Details  Name: Brian [name] MRN: [id number] Date of Birth: January 20, 1933 Referring Provider (PT): Libby Maw, MD   Encounter Date: 01/25/2021   PT End of Session - 01/25/21 2146    Visit Number 1    Authorization Type Medicare    Authorization Time Period 01-25-21 - 03-27-21    PT Start Time 1450    PT Stop Time 1530    PT Time Calculation (min) 40 min    Activity Tolerance Other (comment)   limited by severity of deficits   Behavior During Therapy [facility] for tasks assessed/performed           Past Medical History:  Diagnosis Date  . Anemia   . Atrial flutter ([place])   . BPH (benign prostatic hypertrophy)   . Epistaxis   . Hemorrhage of gastrointestinal tract, unspecified   . History of open heart surgery   . Hypertension   . Obesity (BMI 30.0-34.9) 06/29/2020  . Osteoarthrosis, unspecified whether generalized or localized, unspecified site   . Personal history of venous thrombosis and embolism   . Rosacea   . Stroke ([place])   . TIA (transient ischemic attack)     Past Surgical History:  Procedure Laterality Date  . APPENDECTOMY    . HERNIA REPAIR    . IR GENERIC HISTORICAL  05/04/2016   IR PERCUTANEOUS ART THROMBECTOMY/INFUSION INTRACRANIAL INC DIAG ANGIO 05/04/2016 [name], MD [facility]  . IR GENERIC HISTORICAL  05/04/2016   IR ANGIO VERTEBRAL SEL SUBCLAVIAN INNOMINATE UNI R MOD SED 05/04/2016 [name], MD [facility]  . IR GENERIC HISTORICAL  06/07/2016   IR RADIOLOGIST EVAL & MGMT 06/07/2016 [facility]  . JOINT REPLACEMENT    . MITRAL VALVE REPAIR    . mvp repair    . RADIOLOGY WITH ANESTHESIA N/A 05/04/2016   Procedure: RADIOLOGY WITH ANESTHESIA;  Surgeon: [name], MD;  Location: [facility];  Service: Radiology;  Laterality: N/A;  . TOTAL  HIP ARTHROPLASTY      There were no vitals filed for this visit.    Subjective Assessment - 01/25/21 1459    Subjective Pt's son accompanied pt to eval - pt is in manual wheelchair but unable to propel w/c; Pt states he has not walked in about a year; states when he was discharged home in 2018 - had OP PT at this facility but has not walked since that time - states he did not have anyone to assist him    Patient is accompained by: Family member   [name]   Pertinent History h/o Rt CVA in Oct. 2017    Patient Stated Goals per son "to get him in shape";    Currently in Pain? Yes    Pain Score 5     Pain Orientation Anterior;Right    Pain Descriptors / Indicators Aching;Discomfort;Tender    Pain Type Chronic pain;Neuropathic pain    Pain Onset More than a month ago    Pain Frequency Rarely    Aggravating Factors  no specific factors    Pain Relieving Factors nothing              [facility] PT Assessment - 01/25/21 0001      Assessment   Medical Diagnosis s/p chronic Rt CVA with Lt hemiparesis    Referring Provider (PT) Libby Maw, MD  Onset Date/Surgical Date --   Oct. 2017   Prior Therapy Jan. 2018 - Feb. 2018      Precautions   Precautions Fall      Balance Screen   Has the patient fallen in the past 6 months No    Has the patient had a decrease in activity level because of a fear of falling?  No    Is the patient reluctant to leave their home because of a fear of falling?  No      Prior Function   Level of Independence Needs assistance with transfers;Needs assistance with ADLs;Needs assistance with homemaking   pt is nonambulatory     ROM / Strength   AROM / PROM / Strength Strength      Strength   Overall Strength Deficits    Strength Assessment Site Hip;Knee;Ankle    Right/Left Hip Left    Left Hip Flexion 2/5    Right/Left Knee Left    Left Knee Extension 3-/5    Right/Left Ankle Left    Left Ankle Dorsiflexion 3-/5      Transfers   Transfers  Squat Pivot Transfers;Sit to Stand    Sit to Stand 1: +1 Total assist      Ambulation/Gait   Ambulation/Gait No                      Objective measurements completed on examination: See above findings.               PT Education - 01/25/21 2145    Education Details explained to pt's son that pt does not have potential to amb. at this time due to severity of deficits, spasticity and due to lack of amb. 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  Pt was discharged from OP PT at this facility in Feb. 2018, however pt's son states he has not amb. since that time.  His goal is to "get in shape " and be able to amb. - pt is unable to sit unsupported for longer than 2" and easily loses balance posteriorly in sitting.  Pt does not have potential to ambulate at this time due to severity of deficits.  Pt's son to decide if he wishes to have pt (his father) participate in PT to address sitting balance deficits and transfers.    Personal Factors and Comorbidities Age;Behavior Pattern;Comorbidity 2;Time since onset of injury/illness/exacerbation;Fitness;Past/Current Experience;Transportation    Examination-Activity Limitations Bathing;Lift;Stand;Bed Mobility;Locomotion Level;Toileting;Bend;Transfers;Squat;Stairs;Hygiene/Grooming;Sit;Carry    Examination-Participation Restrictions Meal Prep;Community Activity;Laundry;Shop;Cleaning    Stability/Clinical Decision Making Evolving/Moderate complexity    Clinical Decision Making Moderate    PT Frequency One  time visit    PT Treatment/Interventions ADLs/Self Care Home Management;Balance training;Neuromuscular re-education;Gait training;Patient/family education;Therapeutic exercise;Therapeutic activities;Functional mobility training;Passive range of motion    PT Next Visit Plan ? - son  undecided if pt will schedule PT due to lack of ability to ambulate at this time           Patient will benefit from skilled therapeutic intervention in order to improve the following deficits and impairments:  Decreased balance,Decreased mobility,Decreased strength,Impaired UE functional use,Pain,Difficulty walking,Increased muscle spasms  Visit Diagnosis: Hemiplegia and hemiparesis following cerebral infarction affecting left non-dominant side ([place]) - Plan: PT plan of care cert/re-cert  Other abnormalities of gait and mobility - Plan: PT plan of care cert/re-cert  Other symptoms and signs involving the nervous system - Plan: PT plan of care cert/re-cert     Problem List Patient Active Problem List   Diagnosis Date Noted  . 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  . Obesity (BMI 30.0-34.9) 06/29/2020  . Acute non-recurrent sinusitis 05/11/2020  . Need for influenza vaccination 05/11/2020  . Paroxysmal atrial fibrillation ([place]) 05/11/2020  . Benign prostatic hyperplasia with urinary hesitancy 05/11/2020  . Venous ulcer, limited to breakdown of skin ([place]) 02/06/2020  . Swelling of lower extremity 02/06/2020  . Left ear pain 05/20/2019  . Pressure injury of buttock, stage 1 05/20/2019  . Debilitated 05/20/2019  . Elevated glucose 04/08/2019  . Serum potassium elevated 03/18/2019  . Hyperlipidemia 09/24/2018  . Gait disturbance 09/24/2018  . Anxiety 04/30/2018  . Lumbar radiculopathy 02/14/2018  . Pain in joint of left shoulder 12/28/2017  . Osteoarthritis of left glenohumeral joint 12/28/2017  . Pain in left foot 10/23/2017  . Coronary artery disease involving native coronary artery of native  heart without angina pectoris   . Persistent atrial fibrillation ([place])   . Dysarthria, post-stroke   . Dysphagia, post-stroke   . Cerebrovascular accident (CVA) due to thrombosis of precerebral artery ([place])   . Scoliosis (and kyphoscoliosis), idiopathic 01/26/2014  . Arthralgia of hip 02/07/2012  . Osteoarthritis of hip 02/07/2012  . Sacroiliitis ([place]) 02/07/2012  . ROTATOR CUFF SYNDROME, LEFT 03/07/2010  . OTH MALIG NEOPLASM SKIN OTH&UNSPEC PARTS FACE 12/06/2009  . ACTINIC KERATOSIS, HEAD 09/06/2009  . Osteoarthritis 02/20/2009  . History of mitral valve repair 02/20/2009  . HIP REPLACEMENT, TOTAL, HX OF 02/20/2009  . APPENDECTOMY, HX OF 02/20/2009  . HERNIORRHAPHY, HX OF 02/20/2009  . EPISTAXIS, RECURRENT 11/19/2008  . UNS ADVRS EFF UNS RX MEDICINAL&BIOLOGICAL SBSTNC 06/12/2008  . OTHER AND UNSPECIFIED MITRAL VALVE DISEASES 03/12/2008  . ATRIAL FLUTTER 03/12/2008  . Essential hypertension 03/06/2007  . PEPTIC ULCER DISEASE 03/06/2007  . BENIGN PROSTATIC HYPERTROPHY 03/06/2007  . Coronary atherosclerosis 02/13/2007  . ROSACEA 02/13/2007  . DVT, HX OF 02/13/2007    Alda Lea, PT 01/25/2021, 9:59 PM  [facility] [address] [place], [place], [address] Phone: [phone]   Fax:  [phone]  Name: KIRK BASQUEZ MRN: [id number] Date of Birth: [birth year]

## 2021-01-28 DIAGNOSIS — J302 Other seasonal allergic rhinitis: Secondary | ICD-10-CM | POA: Diagnosis not present

## 2021-01-28 DIAGNOSIS — I10 Essential (primary) hypertension: Secondary | ICD-10-CM | POA: Diagnosis not present

## 2021-01-28 DIAGNOSIS — T7840XD Allergy, unspecified, subsequent encounter: Secondary | ICD-10-CM | POA: Diagnosis not present

## 2021-02-03 ENCOUNTER — Encounter: Payer: Self-pay | Admitting: Family Medicine

## 2021-02-08 ENCOUNTER — Other Ambulatory Visit: Payer: Self-pay

## 2021-02-08 ENCOUNTER — Encounter: Payer: Self-pay | Admitting: Family Medicine

## 2021-02-08 ENCOUNTER — Ambulatory Visit (INDEPENDENT_AMBULATORY_CARE_PROVIDER_SITE_OTHER): Payer: Medicare Other | Admitting: Family Medicine

## 2021-02-08 VITALS — BP 116/62 | HR 66 | Temp 97.9°F | Ht 66.0 in | Wt 177.0 lb

## 2021-02-08 DIAGNOSIS — L03114 Cellulitis of left upper limb: Secondary | ICD-10-CM | POA: Diagnosis not present

## 2021-02-08 DIAGNOSIS — R5381 Other malaise: Secondary | ICD-10-CM

## 2021-02-08 DIAGNOSIS — B356 Tinea cruris: Secondary | ICD-10-CM

## 2021-02-08 DIAGNOSIS — G4709 Other insomnia: Secondary | ICD-10-CM | POA: Diagnosis not present

## 2021-02-08 DIAGNOSIS — M623 Immobility syndrome (paraplegic): Secondary | ICD-10-CM

## 2021-02-08 DIAGNOSIS — L89021 Pressure ulcer of left elbow, stage 1: Secondary | ICD-10-CM | POA: Diagnosis not present

## 2021-02-08 MED ORDER — QUETIAPINE FUMARATE 50 MG PO TABS: 50.0000 mg | ORAL_TABLET | Freq: Every day | ORAL | 2 refills | Status: DC

## 2021-02-08 MED ORDER — KETOCONAZOLE 2 % EX CREA: TOPICAL_CREAM | CUTANEOUS | 1 refills | Status: DC

## 2021-02-08 MED ORDER — "TEGADERM ALGINATE AG DRESSING 4"" X 5"" EX PADS": MEDICATED_PAD | CUTANEOUS | 1 refills | Status: DC

## 2021-02-08 MED ORDER — DOXYCYCLINE HYCLATE 100 MG PO TABS: 100.0000 mg | ORAL_TABLET | Freq: Two times a day (BID) | ORAL | 0 refills | Status: DC

## 2021-02-08 NOTE — Progress Notes (Signed)
Established Patient Office Visit  Subjective:  Patient ID: Brian [name], male    DOB: Jan 20, 1933  Age: 87 y.o. MRN: [id number]  CC:  Chief Complaint  Patient presents with   Wound Check    Sores on left are x few weeks very painful to touch.     HPI Brian [name] presents for follow-up of 2 new sores on his left lateral forearm, establish sore on the posterior left elbow, rash on his inner upper thighs and insomnia.  He is accompanied by his son Brian [name] and his caregiver.  Brian [name]'s sister is on the speaker phone.  Patient is debilitated from chronic pain due to osteoarthritis and a CVA that left him with a flaccid left-sided hemiparesis.  History of pressure sores in the sacral area and left elbow.  He has been taking low-dose Xanax 0.25 mg for sleep.  His daughter asks if Seroquel could be an alternative to the Xanax.  Past Medical History:  Diagnosis Date   Anemia    Atrial flutter ([facility])    BPH (benign prostatic hypertrophy)    Epistaxis    Hemorrhage of gastrointestinal tract, unspecified    History of open heart surgery    Hypertension    Obesity (BMI 30.0-34.9) 06/29/2020   Osteoarthrosis, unspecified whether generalized or localized, unspecified site    Personal history of venous thrombosis and embolism    Rosacea    Stroke ([place])    TIA (transient ischemic attack)     Past Surgical History:  Procedure Laterality Date   APPENDECTOMY     HERNIA REPAIR     IR GENERIC HISTORICAL  05/04/2016   IR PERCUTANEOUS ART THROMBECTOMY/INFUSION INTRACRANIAL INC DIAG ANGIO 05/04/2016 [name], MD [facility]   IR GENERIC HISTORICAL  05/04/2016   IR ANGIO VERTEBRAL SEL SUBCLAVIAN INNOMINATE UNI R MOD SED 05/04/2016 [name], MD [facility]   IR GENERIC HISTORICAL  06/07/2016   IR RADIOLOGIST EVAL & MGMT 06/07/2016 [facility]   JOINT REPLACEMENT     MITRAL VALVE REPAIR     mvp repair     RADIOLOGY WITH ANESTHESIA N/A 05/04/2016   Procedure: RADIOLOGY WITH  ANESTHESIA;  Surgeon: [name], MD;  Location: [facility];  Service: Radiology;  Laterality: N/A;   TOTAL HIP ARTHROPLASTY      Family History  Problem Relation Age of Onset   Rheumatic fever Mother    Coronary artery disease Father     Social History   Socioeconomic History   Marital status: Married    Spouse name: Not on file   Number of children: Not on file   Years of education: Not on file   Highest education level: Not on file  Occupational History   Not on file  Tobacco Use   Smoking status: Former    Pack years: 0.00   Smokeless tobacco: Never   Tobacco comments:    quit 50 yr ago  Vaping Use   Vaping Use: Never used  Substance and Sexual Activity   Alcohol use: No   Drug use: No   Sexual activity: Not on file  Other Topics Concern   Not on file  Social History Narrative   Not on file   Social Determinants of Health   Financial Resource Strain: Low Risk    Difficulty of Paying Living Expenses: Not hard at all  Food Insecurity: Not on file  Transportation Needs: No Transportation Needs   Lack of Transportation (Medical): No   Lack of  Transportation (Non-Medical): No  Physical Activity: Not on file  Stress: Not on file  Social Connections: Not on file  Intimate Partner Violence: Not on file    Outpatient Medications Prior to Visit  Medication Sig Dispense Refill   acetaminophen (TYLENOL) 500 MG tablet Take 500 mg by mouth every 6 (six) hours as needed.     ALPRAZolam (XANAX) 0.25 MG tablet TAKE 1 TABLET BY MOUTH EVERY NIGHT AT BEDTIME 90 tablet 1   cetirizine (ZYRTEC) 10 MG tablet TAKE 1 TABLET(10 MG) BY MOUTH AT BEDTIME 90 tablet 2   chlorhexidine (PERIDEX) 0.12 % solution chlorhexidine gluconate 0.12 % mouthwash  SWISH GENTLY WITH 1/2 CAPFUL FOR 30 SECONDS TWICE DAILY AFTER EATING.     ELIQUIS 2.5 MG TABS tablet TAKE 1 TABLET(2.5 MG) BY MOUTH TWICE DAILY 180 tablet 1   finasteride (PROSCAR) 5 MG tablet TAKE 1 TABLET(5 MG) BY MOUTH DAILY 90 tablet  3   fluticasone (FLONASE) 50 MCG/ACT nasal spray fluticasone propionate 50 mcg/actuation nasal spray,suspension     hydrocortisone-pramoxine (ANALPRAM-HC) 2.5-1 % rectal cream Place 1 application rectally 2 (two) times daily as needed for hemorrhoids.      lidocaine (LIDODERM) 5 % APPLY 1 PATCH TOPICALLY ONCE DAILY.MAY WEAR UPTO 12 HOURS MAXIMUM IN A DAY.     losartan (COZAAR) 25 MG tablet Take 1 tablet (25 mg total) by mouth daily. 90 tablet 3   metoprolol succinate (TOPROL-XL) 25 MG 24 hr tablet TAKE 1 TABLET(25 MG) BY MOUTH DAILY 90 tablet 3   Multiple Vitamin (MULTIVITAMIN) tablet Take 2 tablets by mouth daily.     oxyCODONE-acetaminophen (PERCOCET/ROXICET) 5-325 MG tablet Take 1 tablet by mouth 2 (two) times daily. 180 tablet 0   oxyCODONE-acetaminophen (PERCOCET/ROXICET) 5-325 MG tablet Take 1 tablet by mouth 2 (two) times daily as needed for severe pain. 180 tablet 0   pantoprazole (PROTONIX) 40 MG tablet TAKE 1 TABLET(40 MG) BY MOUTH DAILY 90 tablet 0   polyethylene glycol powder (GLYCOLAX/MIRALAX) 17 GM/SCOOP powder Mix one scoop with water and take daily until stools loosen. May repeat as needed. 850 g 1   pravastatin (PRAVACHOL) 40 MG tablet TAKE 1 TABLET(40 MG) BY MOUTH DAILY 90 tablet 3   sertraline (ZOLOFT) 50 MG tablet TAKE 1 TABLET(50 MG) BY MOUTH EVERY MORNING 90 tablet 3   clotrimazole (LOTRIMIN AF) 1 % cream Apply 1 application topically 2 (two) times daily. (Patient not taking: No sig reported) 30 g 0   hydrocortisone (ANUCORT-HC) 25 MG suppository UNWRAP AND INSERT 1 SUPPOSITORY RECTALLY TWICE DAILY (Patient not taking: No sig reported) 30 suppository 10   hydrocortisone (PROCTOZONE-HC) 2.5 % rectal cream USE RECTALLY TWICE DAILY (Patient not taking: No sig reported) 30 g 0   No facility-administered medications prior to visit.    Allergies  Allergen Reactions   Novocain [Procaine Hcl]     Irregular heart beat      Other     Patient had problem with epidural with his  right hip surgery. Went to up extremity instead of lower extremity    Penicillins Itching    Has patient had a PCN reaction causing immediate rash, facial/tongue/throat swelling, SOB or lightheadedness with hypotension: Unk Has patient had a PCN reaction causing severe rash involving mucus membranes or skin necrosis: Unk Has patient had a PCN reaction that required hospitalization: Unk Has patient had a PCN reaction occurring within the last 10 years: No If all of the above answers are "NO", then may proceed with Cephalosporin use.  No reaction noted    ROS Review of Systems  Constitutional: Negative.   HENT:  Positive for hearing loss.   Respiratory: Negative.    Cardiovascular: Negative.   Gastrointestinal: Negative.   Musculoskeletal:  Positive for arthralgias, back pain and gait problem.  Skin:  Positive for color change, rash and wound.  Neurological:  Positive for weakness.  Psychiatric/Behavioral:  Positive for confusion.      Objective:    Physical Exam Vitals and nursing note reviewed.  Constitutional:      General: He is not in acute distress.    Appearance: He is ill-appearing. He is not toxic-appearing or diaphoretic.  HENT:     Head: Normocephalic and atraumatic.     Right Ear: External ear normal.     Left Ear: External ear normal.  Eyes:     General: No scleral icterus.       Right eye: No discharge.        Left eye: No discharge.     Extraocular Movements: Extraocular movements intact.     Conjunctiva/sclera: Conjunctivae normal.  Cardiovascular:     Rate and Rhythm: Normal rate and regular rhythm.  Pulmonary:     Effort: Pulmonary effort is normal.     Breath sounds: Normal breath sounds.  Skin:    Findings: Erythema present.         BP 116/62   Pulse 66   Temp 97.9 F (36.6 C) (Temporal)   Ht 5\' 6"  (1.676 m)   Wt 177 lb (80.3 kg)   SpO2 99%   BMI 28.57 kg/m  Wt Readings from Last 3 Encounters:  02/08/21 177 lb (80.3 kg)  12/07/20 172 lb  (78 kg)  05/11/20 189 lb (85.7 kg)     Health Maintenance Due  Topic Date Due   Zoster Vaccines- Shingrix (1 of 2) Never done   COVID-19 Vaccine (4 - Booster for Pfizer series) 08/30/2020    There are no preventive care reminders to display for this patient.  Lab Results  Component Value Date   TSH 1.55 12/07/2020   Lab Results  Component Value Date   WBC 8.2 12/07/2020   HGB 15.3 12/07/2020   HCT 45.3 12/07/2020   MCV 100.6 (H) 12/07/2020   PLT 205.0 12/07/2020   Lab Results  Component Value Date   NA 134 (L) 12/07/2020   K 4.4 12/07/2020   CO2 29 12/07/2020   GLUCOSE 84 12/07/2020   BUN 15 12/07/2020   CREATININE 0.47 12/07/2020   BILITOT 0.5 12/07/2020   ALKPHOS 59 12/07/2020   AST 18 12/07/2020   ALT 23 12/07/2020   PROT 5.9 (L) 12/07/2020   ALBUMIN 3.7 12/07/2020   CALCIUM 9.0 12/07/2020   ANIONGAP 13 11/03/2018   GFR 94.49 12/07/2020   Lab Results  Component Value Date   CHOL 123 12/07/2020   Lab Results  Component Value Date   HDL 51.10 12/07/2020   Lab Results  Component Value Date   LDLCALC 56 12/07/2020   Lab Results  Component Value Date   TRIG 83.0 12/07/2020   Lab Results  Component Value Date   CHOLHDL 2 12/07/2020   Lab Results  Component Value Date   HGBA1C 5.1 12/07/2020      Assessment & Plan:   Problem List Items Addressed This Visit       Musculoskeletal and Integument   Paraplegic immobility syndrome - Primary   Pressure injury of left elbow, stage 1   Relevant Medications  CMC-Calcium Alginate-Silver (TEGADERM ALGINATE AG DRESSING) 4" X 5" PADS   Other Relevant Orders   Ambulatory referral to [facility]     Other   Debilitated   Cellulitis of left upper extremity   Relevant Medications   doxycycline (VIBRA-TABS) 100 MG tablet   Other insomnia   Relevant Medications   QUEtiapine (SEROQUEL) 50 MG tablet   Other Visit Diagnoses     Tinea cruris       Relevant Medications   ketoconazole (NIZORAL) 2 %  cream       Meds ordered this encounter  Medications   QUEtiapine (SEROQUEL) 50 MG tablet    Sig: Take 1 tablet (50 mg total) by mouth at bedtime.    Dispense:  30 tablet    Refill:  2   CMC-Calcium Alginate-Silver (TEGADERM ALGINATE AG DRESSING) 4" X 5" PADS    Sig: Apply to sore on back side of left elbow once weekly.    Dispense:  16 each    Refill:  1   ketoconazole (NIZORAL) 2 % cream    Sig: Apply thin coat to rash on inner thighs once daily for 14 days.    Dispense:  60 g    Refill:  1   doxycycline (VIBRA-TABS) 100 MG tablet    Sig: Take 1 tablet (100 mg total) by mouth 2 (two) times daily for 10 days.    Dispense:  20 tablet    Refill:  0    Follow-up: Return in about 3 months (around 05/11/2021), or Take seroquel nightly. Slowly withdraw alprazolam as discussed..   Have asked for visiting home health nurses to assist with evaluation and treatment of pressure sores.  Asked for education and recommendations for movement of patient periodically throughout the day.  Suggested to son Brian [name] that placement could be considered but he would like to keep his father at home.  We will treat the cellulitis with the doxycycline.  Patient has a penicillin allergy.  Covering the pressure sore for left elbow weekly with Tegaderm.  Will use a thin coat of ketoconazole for rash on inner upper thighs for 2 weeks.  We will introduce Seroquel and hopefully be able to phase out the alprazolam.  We will start follow-up in 3 months or sooner if needed. Libby Maw, MD

## 2021-02-15 ENCOUNTER — Ambulatory Visit: Payer: Medicare Other | Admitting: Physical Therapy

## 2021-02-15 ENCOUNTER — Other Ambulatory Visit: Payer: Self-pay

## 2021-02-15 DIAGNOSIS — R29818 Other symptoms and signs involving the nervous system: Secondary | ICD-10-CM | POA: Diagnosis not present

## 2021-02-15 DIAGNOSIS — R2689 Other abnormalities of gait and mobility: Secondary | ICD-10-CM | POA: Diagnosis not present

## 2021-02-15 DIAGNOSIS — I69354 Hemiplegia and hemiparesis following cerebral infarction affecting left non-dominant side: Secondary | ICD-10-CM | POA: Diagnosis not present

## 2021-02-16 ENCOUNTER — Encounter: Payer: Self-pay | Admitting: Physical Therapy

## 2021-02-16 NOTE — Therapy (Signed)
[facility] [address] [place], [place], [address] Phone: [phone]   Fax:  [phone]  Physical Therapy Treatment  Patient Details  Name: Brian [name] MRN: [id number] Date of Birth: May 28, 1933 Referring Provider (PT): Libby Maw, MD   Encounter Date: 02/15/2021   PT End of Session - 02/16/21 0912     Visit Number 2    Number of Visits 4    Authorization Type Medicare    Authorization Time Period 08-27-38 - 03-22-43; recert 04-08-55 - 11-02-95    PT Start Time 1416   pt arrived 15" late for appt   PT Stop Time 0263    PT Time Calculation (min) 29 min    Activity Tolerance Other (comment)   limited by severity of deficits   Behavior During Therapy [facility] for tasks assessed/performed             Past Medical History:  Diagnosis Date   Anemia    Atrial flutter (HCC)    BPH (benign prostatic hypertrophy)    Epistaxis    Hemorrhage of gastrointestinal tract, unspecified    History of open heart surgery    Hypertension    Obesity (BMI 30.0-34.9) 06/29/2020   Osteoarthrosis, unspecified whether generalized or localized, unspecified site    Personal history of venous thrombosis and embolism    Rosacea    Stroke ([place])    TIA (transient ischemic attack)     Past Surgical History:  Procedure Laterality Date   APPENDECTOMY     HERNIA REPAIR     IR GENERIC HISTORICAL  05/04/2016   IR PERCUTANEOUS ART THROMBECTOMY/INFUSION INTRACRANIAL INC DIAG ANGIO 05/04/2016 [name], MD [facility]   IR GENERIC HISTORICAL  05/04/2016   IR ANGIO VERTEBRAL SEL SUBCLAVIAN INNOMINATE UNI R MOD SED 05/04/2016 [name], MD [facility]   IR GENERIC HISTORICAL  06/07/2016   IR RADIOLOGIST EVAL & MGMT 06/07/2016 [facility]   JOINT REPLACEMENT     MITRAL VALVE REPAIR     mvp repair     RADIOLOGY WITH ANESTHESIA N/A 05/04/2016   Procedure: RADIOLOGY WITH ANESTHESIA;  Surgeon: [name], MD;   Location: [facility];  Service: Radiology;  Laterality: N/A;   TOTAL HIP ARTHROPLASTY      There were no vitals filed for this visit.   Subjective Assessment - 02/16/21 0855     Subjective Pt arrives 15" late for PT appt - accompanied by his son, Brian [name], and caregiver; son reports no changes since initial eval on 01-25-21, however, pt was seen by MD on 02-11-21 for 2 wounds/open areas on pt's elbow - home health nursing has been ordered but has not evaluated pt as of current date    Patient is accompained by: Family member   son Brian [name]   Pertinent History h/o Rt CVA in Oct. 2017    Patient Stated Goals per son "to get him in shape";    Currently in Pain? Yes    Pain Score 5     Pain Location Shoulder    Pain Orientation Left    Pain Descriptors / Indicators Aching;Discomfort;Tender    Pain Type Chronic pain;Neuropathic pain    Pain Onset More than a month ago    Pain Frequency Constant                    TherEx;  Bridging x 10 reps with min assist LLE heel slides 10 reps with min assistance Lt hip flexion/extension  in hooklying position 10 reps with assist Lt hip abduction/adduction in hooklying position 10 reps with min assist for full ROM           [facility] Adult PT Treatment/Exercise - 02/16/21 0001       Transfers   Transfers Lateral/Scoot Transfers    Sit to Stand 1: +2 Total assist   from w/c to // bars   Lateral/Scoot Transfers 2: Max assist;With slide board   to Rt side w/c to mat; to Lt side mat to w/c (+2 max assist)     Ambulation/Gait   Ambulation/Gait No    Gait Comments attempted standing 3 times inside // bars - pt unable to stand erect - leans forward with inability to extend Lt knee for LLE weight bearing      Exercises   Exercises Knee/Hip      Knee/Hip Exercises: Aerobic   Recumbent Bike SciFit level 1.2 - 7" from wheelchair                         PT Long Term Goals - 02/16/21 0917       PT LONG TERM GOAL #1   Title  Caregiver/son will correctly demonstrate HEP to assist pt with LLE AROM and strengthening as tolerated.    Time 4    Period Weeks    Status New    Target Date 03/11/21                   Plan - 02/16/21 0913     Clinical Impression Statement PT session focused on assessing bed mobility, transfer training with use of sliding board and LLE AROM and strengthening as pt able to perform.  Pt required max to total assist with transfer w/c to/from mat with use of sliding board.  Pt able to assist approx. 50% with LLE AROM exercises.  Pt unable to stand erect with assistance inside // bars.  Plan is to instruct son and caregiver in HEP due to very limited rehab potential at this time.    Personal Factors and Comorbidities Age;Behavior Pattern;Comorbidity 2;Time since onset of injury/illness/exacerbation;Fitness;Past/Current Experience;Transportation    Examination-Activity Limitations Bathing;Lift;Stand;Bed Mobility;Locomotion Level;Toileting;Bend;Transfers;Squat;Stairs;Hygiene/Grooming;Sit;Carry    Examination-Participation Restrictions Meal Prep;Community Activity;Laundry;Shop;Cleaning    Stability/Clinical Decision Making Evolving/Moderate complexity    PT Frequency One time visit    PT Treatment/Interventions ADLs/Self Care Home Management;Balance training;Neuromuscular re-education;Gait training;Patient/family education;Therapeutic exercise;Therapeutic activities;Functional mobility training;Passive range of motion    PT Next Visit Plan ? - son undecided if pt will schedule PT due to lack of ability to ambulate at this time             Patient will benefit from skilled therapeutic intervention in order to improve the following deficits and impairments:  Decreased balance, Decreased mobility, Decreased strength, Impaired UE functional use, Pain, Difficulty walking, Increased muscle spasms  Visit Diagnosis: Hemiplegia and hemiparesis following cerebral infarction affecting left  non-dominant side ([place]) - Plan: PT plan of care cert/re-cert  Other abnormalities of gait and mobility - Plan: PT plan of care cert/re-cert     Problem List Patient Active Problem List   Diagnosis Date Noted   Cellulitis of left upper extremity 02/08/2021   Pressure injury of left elbow, stage 1 02/08/2021   Other insomnia 02/08/2021   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   Obesity (BMI 30.0-34.9) 06/29/2020   Acute non-recurrent sinusitis 05/11/2020   Need for influenza vaccination 05/11/2020   Paroxysmal atrial fibrillation ([place]) 05/11/2020  Benign prostatic hyperplasia with urinary hesitancy 05/11/2020   Venous ulcer, limited to breakdown of skin ([place]) 02/06/2020   Swelling of lower extremity 02/06/2020   Left ear pain 05/20/2019   Pressure injury of buttock, stage 1 05/20/2019   Debilitated 05/20/2019   Elevated glucose 04/08/2019   Serum potassium elevated 03/18/2019   Hyperlipidemia 09/24/2018   Gait disturbance 09/24/2018   Anxiety 04/30/2018   Lumbar radiculopathy 02/14/2018   Pain in joint of left shoulder 12/28/2017   Osteoarthritis of left glenohumeral joint 12/28/2017   Pain in left foot 10/23/2017   Coronary artery disease involving native coronary artery of native heart without angina pectoris    Persistent atrial fibrillation (HCC)    Dysarthria, post-stroke    Dysphagia, post-stroke    Cerebrovascular accident (CVA) due to thrombosis of precerebral artery ([place])    Scoliosis (and kyphoscoliosis), idiopathic 01/26/2014   Arthralgia of hip 02/07/2012   Osteoarthritis of hip 02/07/2012   Sacroiliitis ([place]) 02/07/2012   ROTATOR CUFF SYNDROME, LEFT 03/07/2010   OTH MALIG NEOPLASM SKIN OTH&UNSPEC PARTS FACE 12/06/2009   ACTINIC KERATOSIS, HEAD 09/06/2009   Osteoarthritis 02/20/2009   History of mitral valve repair 02/20/2009   HIP REPLACEMENT, TOTAL, HX OF 02/20/2009   APPENDECTOMY, HX OF 02/20/2009   HERNIORRHAPHY, HX OF 02/20/2009   EPISTAXIS,  RECURRENT 11/19/2008   UNS ADVRS EFF UNS RX MEDICINAL&BIOLOGICAL SBSTNC 06/12/2008   OTHER AND UNSPECIFIED MITRAL VALVE DISEASES 03/12/2008   ATRIAL FLUTTER 03/12/2008   Essential hypertension 03/06/2007   PEPTIC ULCER DISEASE 03/06/2007   BENIGN PROSTATIC HYPERTROPHY 03/06/2007   Coronary atherosclerosis 02/13/2007   ROSACEA 02/13/2007   DVT, HX OF 02/13/2007    Alda Lea, PT 02/16/2021, 9:28 AM  [facility] [address] [place], [place], [address] Phone: [phone]   Fax:  [phone]  Name: Brian [name] MRN: [id number] Date of Birth: 21-Apr-1933

## 2021-02-17 ENCOUNTER — Telehealth: Payer: Self-pay | Admitting: Family Medicine

## 2021-02-17 NOTE — Telephone Encounter (Signed)
It is ok if pt begins his Start of Care from [facility] next week. You can leave a verbal auth on the # given at [phone] for Lauren.

## 2021-02-18 NOTE — Telephone Encounter (Signed)
Verbal order given. Written order will be faxed per Lauren patient have not been able to be reached for them to call and schedule an appointment to come out.

## 2021-02-21 ENCOUNTER — Other Ambulatory Visit: Payer: Self-pay | Admitting: Family Medicine

## 2021-02-21 DIAGNOSIS — L03114 Cellulitis of left upper limb: Secondary | ICD-10-CM

## 2021-02-22 ENCOUNTER — Ambulatory Visit: Payer: Medicare Other | Attending: Family Medicine | Admitting: Physical Therapy

## 2021-02-22 ENCOUNTER — Other Ambulatory Visit: Payer: Self-pay

## 2021-02-22 DIAGNOSIS — I69354 Hemiplegia and hemiparesis following cerebral infarction affecting left non-dominant side: Secondary | ICD-10-CM | POA: Diagnosis not present

## 2021-02-22 DIAGNOSIS — R29818 Other symptoms and signs involving the nervous system: Secondary | ICD-10-CM | POA: Insufficient documentation

## 2021-02-23 NOTE — Therapy (Signed)
[facility] [address] [place], [place], [address] Phone: [phone]   Fax:  [phone]  Physical Therapy Treatment  Patient Details  Name: Brian [name] MRN: [id number] Date of Birth: November 13, 1932 Referring Provider (PT): Libby Maw, MD   Encounter Date: 02/22/2021   PT End of Session - 02/23/21 1855     Visit Number 3    Number of Visits 4    Authorization Type Medicare    Authorization Time Period 09-28-23 - 10-25-62; recert 11-21-45 - 12-13-93    PT Start Time 1404    PT Stop Time 1446    PT Time Calculation (min) 42 min    Equipment Utilized During Treatment Gait belt    Activity Tolerance Patient tolerated treatment well    Behavior During Therapy WFL for tasks assessed/performed             Past Medical History:  Diagnosis Date   Anemia    Atrial flutter (HCC)    BPH (benign prostatic hypertrophy)    Epistaxis    Hemorrhage of gastrointestinal tract, unspecified    History of open heart surgery    Hypertension    Obesity (BMI 30.0-34.9) 06/29/2020   Osteoarthrosis, unspecified whether generalized or localized, unspecified site    Personal history of venous thrombosis and embolism    Rosacea    Stroke ([place])    TIA (transient ischemic attack)     Past Surgical History:  Procedure Laterality Date   APPENDECTOMY     HERNIA REPAIR     IR GENERIC HISTORICAL  05/04/2016   IR PERCUTANEOUS ART THROMBECTOMY/INFUSION INTRACRANIAL INC DIAG ANGIO 05/04/2016 [name], MD [facility]   IR GENERIC HISTORICAL  05/04/2016   IR ANGIO VERTEBRAL SEL SUBCLAVIAN INNOMINATE UNI R MOD SED 05/04/2016 [name], MD [facility]   IR GENERIC HISTORICAL  06/07/2016   IR RADIOLOGIST EVAL & MGMT 06/07/2016 [facility]   JOINT REPLACEMENT     MITRAL VALVE REPAIR     mvp repair     RADIOLOGY WITH ANESTHESIA N/A 05/04/2016   Procedure: RADIOLOGY WITH ANESTHESIA;  Surgeon: [name], MD;   Location: [facility];  Service: Radiology;  Laterality: N/A;   TOTAL HIP ARTHROPLASTY      There were no vitals filed for this visit.   Subjective Assessment - 02/23/21 1851     Subjective No changes reported by son or caregiver - pt reports no problems    Patient is accompained by: Family member   [name]   Pertinent History h/o Rt CVA in Oct. 2017    Patient Stated Goals per son "to get him in shape";    Currently in Pain? Yes    Pain Score 5     Pain Location Shoulder    Pain Orientation Left    Pain Descriptors / Indicators Aching    Pain Type Chronic pain;Neuropathic pain    Pain Onset More than a month ago    Pain Frequency Constant                               [facility] Adult PT Treatment/Exercise - 02/23/21 0001       Transfers   Transfers Lateral/Scoot Transfers    Sit to Stand 1: +1 Total assist    Lateral/Scoot Transfers 2: Max assist      Ambulation/Gait   Ambulation/Gait No      Knee/Hip Exercises:  Aerobic   Recumbent Bike SciFit level 1.0 - 12"  from wheelchair      Knee/Hip Exercises: Supine   Heel Slides AROM;Left;1 set;10 reps    Bridges AROM;1 set;10 reps    Straight Leg Raises AROM;Left;1 set;10 reps    Other Supine Knee/Hip Exercises hip flexion/extension 10 reps LLE    Other Supine Knee/Hip Exercises Lt hip abduction/adduction 10 reps in hooklying position            Worked on sitting balance on side of mat - unsupported without use of UE support; pt performed lateral weight shifts  With min assist; also performed 1/2 side sitting - leaning down on Rt and Lt forearm and returning to upright position with CGA to min assist             PT Long Term Goals - 02/23/21 1900       PT LONG TERM GOAL #1   Title Caregiver/son will correctly demonstrate HEP to assist pt with LLE AROM and strengthening as tolerated.    Time 4    Period Weeks    Status New                   Plan - 02/23/21 1856     Clinical  Impression Statement Pt is dependent for all transfers but did demonstrate improvement with unsupported sitting balance on side of mat with pt able to sit without UE support for static sitting balance. Pt did have LOB with dynamic sitting balance without UE support.  Pt able to perform SciFit exercise for 12" prior to fatigue.  Cont with POC,.    Personal Factors and Comorbidities Age;Behavior Pattern;Comorbidity 2;Time since onset of injury/illness/exacerbation;Fitness;Past/Current Experience;Transportation    Examination-Activity Limitations Bathing;Lift;Stand;Bed Mobility;Locomotion Level;Toileting;Bend;Transfers;Squat;Stairs;Hygiene/Grooming;Sit;Carry    Examination-Participation Restrictions Meal Prep;Community Activity;Laundry;Shop;Cleaning    Stability/Clinical Decision Making Evolving/Moderate complexity    PT Frequency One time visit    PT Treatment/Interventions ADLs/Self Care Home Management;Balance training;Neuromuscular re-education;Gait training;Patient/family education;Therapeutic exercise;Therapeutic activities;Functional mobility training;Passive range of motion    PT Next Visit Plan give pics for HEP             Patient will benefit from skilled therapeutic intervention in order to improve the following deficits and impairments:  Decreased balance, Decreased mobility, Decreased strength, Impaired UE functional use, Pain, Difficulty walking, Increased muscle spasms  Visit Diagnosis: Hemiplegia and hemiparesis following cerebral infarction affecting left non-dominant side (HCC)  Other symptoms and signs involving the nervous system     Problem List Patient Active Problem List   Diagnosis Date Noted   Cellulitis of left upper extremity 02/08/2021   Pressure injury of left elbow, stage 1 02/08/2021   Other insomnia 02/08/2021   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   Obesity (BMI 30.0-34.9) 06/29/2020   Acute non-recurrent sinusitis 05/11/2020   Need for influenza  vaccination 05/11/2020   Paroxysmal atrial fibrillation ([place]) 05/11/2020   Benign prostatic hyperplasia with urinary hesitancy 05/11/2020   Venous ulcer, limited to breakdown of skin ([place]) 02/06/2020   Swelling of lower extremity 02/06/2020   Left ear pain 05/20/2019   Pressure injury of buttock, stage 1 05/20/2019   Debilitated 05/20/2019   Elevated glucose 04/08/2019   Serum potassium elevated 03/18/2019   Hyperlipidemia 09/24/2018   Gait disturbance 09/24/2018   Anxiety 04/30/2018   Lumbar radiculopathy 02/14/2018   Pain in joint of left shoulder 12/28/2017   Osteoarthritis of left glenohumeral joint 12/28/2017   Pain in left foot 10/23/2017   Coronary artery disease  involving native coronary artery of native heart without angina pectoris    Persistent atrial fibrillation (HCC)    Dysarthria, post-stroke    Dysphagia, post-stroke    Cerebrovascular accident (CVA) due to thrombosis of precerebral artery ([place])    Scoliosis (and kyphoscoliosis), idiopathic 01/26/2014   Arthralgia of hip 02/07/2012   Osteoarthritis of hip 02/07/2012   Sacroiliitis ([place]) 02/07/2012   ROTATOR CUFF SYNDROME, LEFT 03/07/2010   OTH MALIG NEOPLASM SKIN OTH&UNSPEC PARTS FACE 12/06/2009   ACTINIC KERATOSIS, HEAD 09/06/2009   Osteoarthritis 02/20/2009   History of mitral valve repair 02/20/2009   HIP REPLACEMENT, TOTAL, HX OF 02/20/2009   APPENDECTOMY, HX OF 02/20/2009   HERNIORRHAPHY, HX OF 02/20/2009   EPISTAXIS, RECURRENT 11/19/2008   UNS ADVRS EFF UNS RX MEDICINAL&BIOLOGICAL SBSTNC 06/12/2008   OTHER AND UNSPECIFIED MITRAL VALVE DISEASES 03/12/2008   ATRIAL FLUTTER 03/12/2008   Essential hypertension 03/06/2007   PEPTIC ULCER DISEASE 03/06/2007   BENIGN PROSTATIC HYPERTROPHY 03/06/2007   Coronary atherosclerosis 02/13/2007   ROSACEA 02/13/2007   DVT, HX OF 02/13/2007    Alda Lea, PT 02/23/2021, 7:01 PM  [facility] [address] [place], [place], [address] Phone: [phone]   Fax:  [phone]  Name: REUVEN BRAVER MRN: [id number] Date of Birth: October 26, 1932

## 2021-03-01 ENCOUNTER — Other Ambulatory Visit: Payer: Self-pay

## 2021-03-01 ENCOUNTER — Ambulatory Visit: Payer: Medicare Other | Admitting: Physical Therapy

## 2021-03-01 DIAGNOSIS — R29818 Other symptoms and signs involving the nervous system: Secondary | ICD-10-CM | POA: Diagnosis not present

## 2021-03-01 DIAGNOSIS — I69354 Hemiplegia and hemiparesis following cerebral infarction affecting left non-dominant side: Secondary | ICD-10-CM | POA: Diagnosis not present

## 2021-03-02 NOTE — Therapy (Signed)
[facility] [address] [place], [place], [address] Phone: [phone]   Fax:  [phone]  Physical Therapy Treatment  Patient Details  Name: Brian [name] MRN: [id number] Date of Birth: 02-06-1933 Referring Provider (PT): Libby Maw, MD   Encounter Date: 03/01/2021   PT End of Session - 03/02/21 1407     Visit Number 4    Number of Visits 5    Authorization Type Medicare    Authorization Time Period 1-0-93 - 09-23-53; recert 7-32-20 - 2-54-27    PT Start Time 1400    PT Stop Time 1445    PT Time Calculation (min) 45 min    Equipment Utilized During Treatment Gait belt    Activity Tolerance Patient tolerated treatment well    Behavior During Therapy WFL for tasks assessed/performed             Past Medical History:  Diagnosis Date   Anemia    Atrial flutter (HCC)    BPH (benign prostatic hypertrophy)    Epistaxis    Hemorrhage of gastrointestinal tract, unspecified    History of open heart surgery    Hypertension    Obesity (BMI 30.0-34.9) 06/29/2020   Osteoarthrosis, unspecified whether generalized or localized, unspecified site    Personal history of venous thrombosis and embolism    Rosacea    Stroke ([place])    TIA (transient ischemic attack)     Past Surgical History:  Procedure Laterality Date   APPENDECTOMY     HERNIA REPAIR     IR GENERIC HISTORICAL  05/04/2016   IR PERCUTANEOUS ART THROMBECTOMY/INFUSION INTRACRANIAL INC DIAG ANGIO 05/04/2016 [name], MD [facility]   IR GENERIC HISTORICAL  05/04/2016   IR ANGIO VERTEBRAL SEL SUBCLAVIAN INNOMINATE UNI R MOD SED 05/04/2016 [name], MD [facility]   IR GENERIC HISTORICAL  06/07/2016   IR RADIOLOGIST EVAL & MGMT 06/07/2016 [facility]   JOINT REPLACEMENT     MITRAL VALVE REPAIR     mvp repair     RADIOLOGY WITH ANESTHESIA N/A 05/04/2016   Procedure: RADIOLOGY WITH ANESTHESIA;  Surgeon: [name], MD;   Location: [facility];  Service: Radiology;  Laterality: N/A;   TOTAL HIP ARTHROPLASTY      There were no vitals filed for this visit.   Subjective Assessment - 03/02/21 1359     Subjective Son reports no changes or problems    Patient is accompained by: Family member   son Brian [name]   Pertinent History h/o Rt CVA in Oct. 2017    Patient Stated Goals per son "to get him in shape";    Currently in Pain? Yes    Pain Score 5     Pain Location Elbow    Pain Orientation Left    Pain Descriptors / Indicators Aching    Pain Type Chronic pain    Pain Onset More than a month ago    Pain Frequency Constant                               [facility] Adult PT Treatment/Exercise - 03/02/21 0001       Transfers   Transfers Lateral/Scoot Transfers    Sit to Stand --   sliding board used for mat to wheelchair   Lateral/Scoot Transfers 2: Max assist    Comments Son lifted pt from w/c to mat for transfer - "to show how he does  it at home"      Ambulation/Gait   Ambulation/Gait No      Neuro Re-ed    Neuro Re-ed Details  Worked on unsupported sitting balance on side of mat; pt needs cues to sit erect and to hold head up, as he holds head down due to forward head posture      Knee/Hip Exercises: Aerobic   Recumbent Bike Sci[employer] level 1.0 - 15"  from wheelchair   used hand grip glove on Lt hand to secure hand on handle     Knee/Hip Exercises: Supine   Heel Slides AROM;Left;1 set;10 reps    Bridges AROM;1 set;10 reps    Straight Leg Raises AROM;Left;1 set;10 reps    Other Supine Knee/Hip Exercises hip flexion/extension 10 reps LLE - in hooklying position    Other Supine Knee/Hip Exercises Lt hip abduction/adduction 10 reps in hooklying position                    PT Education - 03/02/21 1406     Education Details Med[employer] HEP [email or web address]    Person(s) Educated Patient;Child(ren)    Methods Explanation;Demonstration;Handout    Comprehension Verbalized understanding;Returned  demonstration            Access Code: [email or web address] URL: [email or web address] Date: 03/02/2021 Prepared by: Ethelene Browns  Exercises Supine Bridge - 1 x daily - 7 x weekly - 1 sets - 10 reps Supine Heel Slide - 1 x daily - 7 x weekly - 3 sets - 10 reps Bent Knee Fallouts - 1 x daily - 7 x weekly - 3 sets - 10 reps Supine Straight Leg Raises - 1 x daily - 7 x weekly - 3 sets - 10 reps Supine Hamstring Stretch with Caregiver - 1 x daily - 7 x weekly - 1 sets - 1 reps - 15-20 hold      PT Long Term Goals - 03/02/21 1416       PT LONG TERM GOAL #1   Title Caregiver/son will correctly demonstrate HEP to assist pt with LLE AROM and strengthening as tolerated.    Time 4    Period Weeks    Status New                   Plan - 03/02/21 1410     Clinical Impression Statement Sliding board was very beneficial in performing lateral scoot transfer from mat to wheelchair towards pt's Lt side - able to perform with max assist.  Pt's son performed dependent transfer with transferring pt from wheelchair to mat, lifting pt and then flinging him onto mat.  Son was informed that he was at risk for back injury with this technique.  Sliding board was strongly recommended to son and caregiver, with information given on ordering one from online.  Session focused on HEP instruction to son to assist with pt's LLE ROM and strengthening.  Plan D/C next session.    Personal Factors and Comorbidities Age;Behavior Pattern;Comorbidity 2;Time since onset of injury/illness/exacerbation;Fitness;Past/Current Experience;Transportation    Examination-Activity Limitations Bathing;Lift;Stand;Bed Mobility;Locomotion Level;Toileting;Bend;Transfers;Squat;Stairs;Hygiene/Grooming;Sit;Carry    Examination-Participation Restrictions Meal Prep;Community Activity;Laundry;Shop;Cleaning    Stability/Clinical Decision Making Evolving/Moderate complexity    PT Frequency One time visit    PT  Treatment/Interventions ADLs/Self Care Home Management;Balance training;Neuromuscular re-education;Gait training;Patient/family education;Therapeutic exercise;Therapeutic activities;Functional mobility training;Passive range of motion    PT Next Visit Plan D/C next session    PT [place] and Agree with Plan of Care  Patient;Family member/caregiver    Family Member Consulted son, Brian [name]             Patient will benefit from skilled therapeutic intervention in order to improve the following deficits and impairments:  Decreased balance, Decreased mobility, Decreased strength, Impaired UE functional use, Pain, Difficulty walking, Increased muscle spasms  Visit Diagnosis: Hemiplegia and hemiparesis following cerebral infarction affecting left non-dominant side ([place])  Other symptoms and signs involving the nervous system     Problem List Patient Active Problem List   Diagnosis Date Noted   Cellulitis of left upper extremity 02/08/2021   Pressure injury of left elbow, stage 1 02/08/2021   Other insomnia 02/08/2021   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   Obesity (BMI 30.0-34.9) 06/29/2020   Acute non-recurrent sinusitis 05/11/2020   Need for influenza vaccination 05/11/2020   Paroxysmal atrial fibrillation ([place]) 05/11/2020   Benign prostatic hyperplasia with urinary hesitancy 05/11/2020   Venous ulcer, limited to breakdown of skin ([place]) 02/06/2020   Swelling of lower extremity 02/06/2020   Left ear pain 05/20/2019   Pressure injury of buttock, stage 1 05/20/2019   Debilitated 05/20/2019   Elevated glucose 04/08/2019   Serum potassium elevated 03/18/2019   Hyperlipidemia 09/24/2018   Gait disturbance 09/24/2018   Anxiety 04/30/2018   Lumbar radiculopathy 02/14/2018   Pain in joint of left shoulder 12/28/2017   Osteoarthritis of left glenohumeral joint 12/28/2017   Pain in left foot 10/23/2017   Coronary artery disease involving  native coronary artery of native heart without angina pectoris    Persistent atrial fibrillation ([place])    Dysarthria, post-stroke    Dysphagia, post-stroke    Cerebrovascular accident (CVA) due to thrombosis of precerebral artery ([place])    Scoliosis (and kyphoscoliosis), idiopathic 01/26/2014   Arthralgia of hip 02/07/2012   Osteoarthritis of hip 02/07/2012   Sacroiliitis ([place]) 02/07/2012   ROTATOR CUFF SYNDROME, LEFT 03/07/2010   OTH MALIG NEOPLASM SKIN OTH&UNSPEC PARTS FACE 12/06/2009   ACTINIC KERATOSIS, HEAD 09/06/2009   Osteoarthritis 02/20/2009   History of mitral valve repair 02/20/2009   HIP REPLACEMENT, TOTAL, HX OF 02/20/2009   APPENDECTOMY, HX OF 02/20/2009   HERNIORRHAPHY, HX OF 02/20/2009   EPISTAXIS, RECURRENT 11/19/2008   UNS ADVRS EFF UNS RX MEDICINAL&BIOLOGICAL SBSTNC 06/12/2008   OTHER AND UNSPECIFIED MITRAL VALVE DISEASES 03/12/2008   ATRIAL FLUTTER 03/12/2008   Essential hypertension 03/06/2007   PEPTIC ULCER DISEASE 03/06/2007   BENIGN PROSTATIC HYPERTROPHY 03/06/2007   Coronary atherosclerosis 02/13/2007   ROSACEA 02/13/2007   DVT, HX OF 02/13/2007    Kaylob Wallen, [place], PT 03/02/2021, 2:18 PM  [facility] [address] [place], [place], [address] Phone: [phone]   Fax:  [phone]  Name: Brian [name] MRN: [id number] Date of Birth: March 15, 1933

## 2021-03-07 ENCOUNTER — Telehealth: Payer: Self-pay

## 2021-03-07 ENCOUNTER — Other Ambulatory Visit: Payer: Self-pay | Admitting: Family Medicine

## 2021-03-07 DIAGNOSIS — L03114 Cellulitis of left upper limb: Secondary | ICD-10-CM

## 2021-03-07 DIAGNOSIS — K219 Gastro-esophageal reflux disease without esophagitis: Secondary | ICD-10-CM

## 2021-03-07 NOTE — Progress Notes (Signed)
Spoke to patient wife to confirmed patient telephone appointment on 03/08/2021 for CCM at 1:00 pm with Junius Argyle the Clinical pharmacist.   Patient wife verbalized understanding. Patient wife  aware to have all medications, supplements, blood pressure  logs to visit.  Questions: Have you had any recent office visit or specialist visit outside of [facility]?No Are there any concerns you would like to discuss during your office visit?No Are you having any problems obtaining your medications? No If patient has any PAP medications ask if they are having any problems getting their PAP medication or refill?No  Care Gaps: Shingrix COVID- 19 Vaccine Star Rating Drug: Pravastatin 40 mg last filled 02/02/2021 90 day supply at [facility] Losartan 25 mg last filled 11/05/2020 90 days supply at [facility]   Any gaps in medications fill history? None   Bessie [place] Pharmacist Assistant [phone]

## 2021-03-08 ENCOUNTER — Ambulatory Visit (INDEPENDENT_AMBULATORY_CARE_PROVIDER_SITE_OTHER): Payer: Medicare Other

## 2021-03-08 DIAGNOSIS — E785 Hyperlipidemia, unspecified: Secondary | ICD-10-CM | POA: Diagnosis not present

## 2021-03-08 DIAGNOSIS — I10 Essential (primary) hypertension: Secondary | ICD-10-CM

## 2021-03-08 DIAGNOSIS — I480 Paroxysmal atrial fibrillation: Secondary | ICD-10-CM | POA: Diagnosis not present

## 2021-03-08 NOTE — Progress Notes (Signed)
Chronic Care Management Pharmacy Note  03/28/2021 Name:  Brian [name] MRN:  [id number] DOB:  [birth year]  Summary: Patient presents for CCM follow-up.  Recommendations/Changes made from today's visit: Continue current medications  Plan: CPP follow-up in 6 months   Subjective: Brian [name] is an 87 y.o. year old male who is a primary patient of Ethelene Hal Mortimer Fries, MD.  The CCM team was consulted for assistance with disease management and care coordination needs.    Engaged with patient by telephone for follow up visit in response to provider referral for pharmacy case management and/or care coordination services.   Consent to Services:  The patient was given information about Chronic Care Management services, agreed to services, and gave verbal consent prior to initiation of services.  Please see initial visit note for detailed documentation.   Patient Care Team: Libby Maw, MD as PCP - General (Family Medicine) Skeet Latch, MD as PCP - Cardiology (Cardiology) Germaine Pomfret, [facility] as Pharmacist (Pharmacist)  Recent office visits: 02/08/21: Patient presented to Dr. Ethelene Hal for follow-up. Doxycycline x 10 days, seroquel started.   Recent consult visits: None in previous 6 months  Hospital visits: None in previous 6 months   Objective:  Lab Results  Component Value Date   CREATININE 0.47 12/07/2020   BUN 15 12/07/2020   GFR 94.49 12/07/2020   GFRNONAA >60 11/03/2018   GFRAA >60 11/03/2018   NA 134 (L) 12/07/2020   K 4.4 12/07/2020   CALCIUM 9.0 12/07/2020   CO2 29 12/07/2020   GLUCOSE 84 12/07/2020    Lab Results  Component Value Date/Time   HGBA1C 5.1 12/07/2020 03:11 PM   HGBA1C 4.9 04/08/2019 02:25 PM   GFR 94.49 12/07/2020 03:11 PM   GFR 110.74 05/11/2020 02:06 PM    Last diabetic Eye exam: No results found for: HMDIABEYEEXA  Last diabetic Foot exam: No results found for: HMDIABFOOTEX   Lab Results  Component Value Date    CHOL 123 12/07/2020   HDL 51.10 12/07/2020   LDLCALC 56 12/07/2020   LDLDIRECT 68 02/06/2020   TRIG 83.0 12/07/2020   CHOLHDL 2 12/07/2020    Hepatic Function Latest Ref Rng & Units 12/07/2020 05/11/2020 05/12/2018  Total Protein 6.0 - 8.3 g/dL 5.9(L) 6.6 6.4(L)  Albumin 3.5 - 5.2 g/dL 3.7 4.1 3.5  AST 0 - 37 U/L _0 ALT 0 - 53 U/L _1 Alk Phosphatase 39 - 117 U/L 59 78 73  Total Bilirubin 0.2 - 1.2 mg/dL 0.5 0.5 0.8  Bilirubin, Direct 0.0 - 0.3 mg/dL - - -    Lab Results  Component Value Date/Time   TSH 1.55 12/07/2020 03:11 PM   TSH 2.99 09/26/2016 09:50 AM   FREET4 1.19 05/15/2011 09:25 AM    CBC Latest Ref Rng & Units 12/07/2020 11/06/2019 03/18/2019  WBC 4.0 - 10.5 K/uL 8.2 8.4 7.0  Hemoglobin 13.0 - 17.0 g/dL 15.3 15.1 15.5  Hematocrit 39.0 - 52.0 % 45.3 44.9 46.0  Platelets 150.0 - 400.0 K/uL 205.0 241.0 231.0    No results found for: VD25OH  Clinical ASCVD: Yes  The ASCVD Risk score Mikey Bussing DC Jr., et al., 2013) failed to calculate for the following reasons:   The 2013 ASCVD risk score is only valid for ages 11 to 54   The patient has a prior MI or stroke diagnosis    Depression screen [facility] 2/9 02/08/2021 12/07/2020 03/09/2020  Decreased Interest 0 0 0  Down, Depressed,  Hopeless 0 0 0  PHQ - 2 Score 0 0 0  Some recent data might be hidden    Social History   Tobacco Use  Smoking Status Former  Smokeless Tobacco Never  Tobacco Comments   quit 50 yr ago   BP Readings from Last 3 Encounters:  03/23/21 118/70  02/08/21 116/62  12/07/20 138/72   Pulse Readings from Last 3 Encounters:  03/23/21 85  02/08/21 66  12/07/20 74   Wt Readings from Last 3 Encounters:  02/08/21 177 lb (80.3 kg)  12/07/20 172 lb (78 kg)  05/11/20 189 lb (85.7 kg)   BMI Readings from Last 3 Encounters:  03/23/21 28.57 kg/m  02/08/21 28.57 kg/m  12/07/20 27.76 kg/m    Assessment/Interventions: Review of patient past medical history, allergies, medications, health  status, including review of consultants reports, laboratory and other test data, was performed as part of comprehensive evaluation and provision of chronic care management services.   SDOH:  (Social Determinants of Health) assessments and interventions performed: Yes  SDOH Screenings   Alcohol Screen: Not on file  Depression (PHQ2-9): Low Risk    PHQ-2 Score: 0  Financial Resource Strain: Low Risk    Difficulty of Paying Living Expenses: Not hard at all  Food Insecurity: Not on file  Housing: Not on file  Physical Activity: Not on file  Social Connections: Not on file  Stress: Not on file  Tobacco Use: Medium Risk   Smoking Tobacco Use: Former   Smokeless Tobacco Use: Never  Transportation Needs: No Transportation Needs   Lack of Transportation (Medical): No   Lack of Transportation (Non-Medical): No    CCM Care Plan  Allergies  Allergen Reactions   Novocain [Procaine Hcl]     Irregular heart beat      Other     Patient had problem with epidural with his right hip surgery. Went to up extremity instead of lower extremity    Penicillins Itching    Has patient had a PCN reaction causing immediate rash, facial/tongue/throat swelling, SOB or lightheadedness with hypotension: Unk Has patient had a PCN reaction causing severe rash involving mucus membranes or skin necrosis: Unk Has patient had a PCN reaction that required hospitalization: Unk Has patient had a PCN reaction occurring within the last 10 years: No If all of the above answers are "NO", then may proceed with Cephalosporin use.  No reaction noted    Medications Reviewed Today     Reviewed by Haydee Salter, MD (Physician) on 03/23/21 at 1353  Med List Status: <None>   Medication Order Taking? Sig Documenting Provider Last Dose Status Informant  acetaminophen (TYLENOL) 500 MG tablet [id number] Yes Take 500 mg by mouth every 6 (six) hours as needed. [provider] Taking Active   ALPRAZolam Duanne Moron) 0.25  MG tablet [id number] Yes TAKE 1 TABLET BY MOUTH EVERY NIGHT AT BEDTIME Dutch Quint B, FNP Taking Active   cetirizine (ZYRTEC) 10 MG tablet [id number] Yes TAKE 1 TABLET(10 MG) BY MOUTH AT BEDTIME Libby Maw, MD Taking Active   chlorhexidine (PERIDEX) 0.12 % solution [id number] Yes chlorhexidine gluconate 0.12 % mouthwash  SWISH GENTLY WITH 1/2 CAPFUL FOR 30 SECONDS TWICE DAILY AFTER EATING. [provider] Taking Active   clotrimazole (LOTRIMIN AF) 1 % cream [id number] Yes Apply 1 application topically 2 (two) times daily. Ladene Artist, MD Taking Active   CMC-Calcium Alginate-Silver [facility] ALGINATE AG DRESSING) 4" X 5" PADS [id number] Yes Apply to sore on back side  of left elbow once weekly. Libby Maw, MD Taking Active   Patient not taking:  Discontinued 03/23/21 1351 (No longer needed (for PRN medications))   ELIQUIS 2.5 MG TABS tablet [id number] Yes TAKE 1 TABLET(2.5 MG) BY MOUTH TWICE DAILY Dutch Quint B, FNP Taking Active   finasteride (PROSCAR) 5 MG tablet [id number] Yes TAKE 1 TABLET(5 MG) BY MOUTH DAILY Libby Maw, MD Taking Active   fluticasone [facility]) 50 MCG/ACT nasal spray [id number] Yes fluticasone propionate 50 mcg/actuation nasal spray,suspension [provider] Taking Active   hydrocortisone (ANUCORT-HC) 25 MG suppository [id number] Yes UNWRAP AND INSERT 1 SUPPOSITORY RECTALLY TWICE DAILY Ladene Artist, MD Taking Active   hydrocortisone (PROCTOZONE-HC) 2.5 % rectal cream [id number] Yes USE RECTALLY TWICE DAILY Ladene Artist, MD Taking Active   hydrocortisone-pramoxine [facility]) 2.5-1 % rectal cream [id number] Yes Place 1 application rectally 2 (two) times daily as needed for hemorrhoids.  [provider] Taking Active Family Member  ketoconazole (NIZORAL) 2 % cream [id number] Yes Apply thin coat to rash on inner thighs once daily for 14 days. Libby Maw, MD Taking Active   lidocaine (LIDODERM) 5 %  [id number] Yes APPLY 1 PATCH TOPICALLY ONCE DAILY.MAY WEAR UPTO 12 HOURS MAXIMUM IN A DAY. [provider] Taking Active   losartan (COZAAR) 25 MG tablet [id number] Yes Take 1 tablet (25 mg total) by mouth daily. Skeet Latch, MD Taking Active   metoprolol succinate (TOPROL-XL) 25 MG 24 hr tablet [id number] Yes TAKE 1 TABLET(25 MG) BY MOUTH DAILY Libby Maw, MD Taking Active   Multiple Vitamin (MULTIVITAMIN) tablet [id number] Yes Take 2 tablets by mouth daily. [provider] Taking Active   oxyCODONE-acetaminophen (PERCOCET/ROXICET) 5-325 MG tablet [id number] Yes Take 1 tablet by mouth 2 (two) times daily as needed for severe pain. Libby Maw, MD Taking Active   pantoprazole (PROTONIX) 40 MG tablet [id number] Yes TAKE 1 TABLET(40 MG) BY MOUTH DAILY Libby Maw, MD Taking Active   polyethylene glycol powder [facility]) 17 GM/SCOOP powder [id number] Yes Mix one scoop with water and take daily until stools loosen. May repeat as needed. Libby Maw, MD Taking Active   pravastatin (PRAVACHOL) 40 MG tablet [id number] Yes TAKE 1 TABLET(40 MG) BY MOUTH DAILY Libby Maw, MD Taking Active   QUEtiapine (SEROQUEL) 50 MG tablet [id number] Yes Take 1 tablet (50 mg total) by mouth at bedtime. 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   Obesity (BMI 30.0-34.9) 06/29/2020   Acute non-recurrent sinusitis 05/11/2020   Need for influenza vaccination 05/11/2020   Paroxysmal atrial fibrillation ([place]) 05/11/2020   Benign prostatic hyperplasia with urinary hesitancy  05/11/2020   Venous ulcer, limited to breakdown of skin ([place]) 02/06/2020   Swelling of lower extremity 02/06/2020   Left ear pain 05/20/2019   Pressure injury of buttock, stage 1 05/20/2019   Debilitated 05/20/2019   Elevated glucose 04/08/2019   Serum potassium elevated 03/18/2019   Hyperlipidemia 09/24/2018   Gait disturbance 09/24/2018   Anxiety 04/30/2018   Lumbar radiculopathy 02/14/2018   Pain in joint of left shoulder  12/28/2017   Osteoarthritis of left glenohumeral joint 12/28/2017   Pain in left foot 10/23/2017   Coronary artery disease involving native coronary artery of native heart without angina pectoris    Persistent atrial fibrillation (HCC)    Dysarthria, post-stroke    Dysphagia, post-stroke    Cerebrovascular accident (CVA) due to thrombosis of precerebral artery ([place])    Scoliosis (and kyphoscoliosis), idiopathic 01/26/2014   Arthralgia of hip 02/07/2012   Osteoarthritis of hip 02/07/2012   Sacroiliitis ([place]) 02/07/2012   ROTATOR CUFF SYNDROME, LEFT 03/07/2010   OTH MALIG NEOPLASM SKIN OTH&UNSPEC PARTS FACE 12/06/2009   ACTINIC KERATOSIS, HEAD 09/06/2009   Osteoarthritis 02/20/2009   History of mitral valve repair 02/20/2009   HIP REPLACEMENT, TOTAL, HX OF 02/20/2009   APPENDECTOMY, HX OF 02/20/2009   HERNIORRHAPHY, HX OF 02/20/2009   EPISTAXIS, RECURRENT 11/19/2008   UNS ADVRS EFF UNS RX MEDICINAL&BIOLOGICAL SBSTNC 06/12/2008   OTHER AND UNSPECIFIED MITRAL VALVE DISEASES 03/12/2008   ATRIAL FLUTTER 03/12/2008   Essential hypertension 03/06/2007   PEPTIC ULCER DISEASE 03/06/2007   BENIGN PROSTATIC HYPERTROPHY 03/06/2007   Coronary atherosclerosis 02/13/2007   ROSACEA 02/13/2007   DVT, HX OF 02/13/2007    Immunization History  Administered Date(s) Administered   Fluad Quad(high Dose 65+) 05/11/2020   H1N1 09/01/2008   Influenza Inj Mdck Quad Pf 05/27/2018   Influenza Split 05/15/2011   Influenza Whole 04/26/2009, 04/25/2013   Influenza, High Dose  Seasonal PF 05/17/2017, 05/12/2019, 05/12/2019   Influenza,inj,Quad PF,6+ Mos 05/10/2016   Influenza-Unspecified 05/03/2014, 05/12/2015, 06/14/2017   PFIZER(Purple Top)SARS-COV-2 Vaccination 11/20/2019, 12/19/2019, 05/30/2020   Pneumococcal Conjugate-13 07/21/2013   Pneumococcal Polysaccharide-23 09/04/2007   Td 09/04/2007, 03/12/2008   Tdap 05/03/2014   Zoster, Live 01/27/2011    Conditions to be addressed/monitored:  Hypertension, Hyperlipidemia, and Atrial Fibrillation  Care Plan : General Pharmacy (Adult)  Updates made by Germaine Pomfret, [place] since 03/28/2021 12:00 AM     Problem: Hypertension, Hyperlipidemia, and Atrial Fibrillation   Priority: High     Long-Range Goal: Patient-Specific Goal   Start Date: 03/28/2021  Expected End Date: 03/28/2022  This Visit's Progress: On track  Priority: High  Note:   Current Barriers:  No Barriers Noted  Pharmacist Clinical Goal(s):  Patient will maintain control of blood pressure as evidenced by BP less than 140/90  through collaboration with PharmD and provider.   Interventions: 1:1 collaboration with Libby Maw, MD regarding development and update of comprehensive plan of care as evidenced by provider attestation and co-signature Inter-disciplinary care team collaboration (see longitudinal plan of care) Comprehensive medication review performed; medication list updated in electronic medical record  Hypertension (BP goal <140/90) -Controlled -Current treatment: Losartan 25 mg daily  Metoprolol XL 25 mg daily  -Medications previously tried: NA  -Current home readings: NA -Denies hypotensive/hypertensive symptoms -Educated on Daily salt intake goal < 2300 mg; -Counseled to monitor BP at home weekly, document, and provide log at future appointments -Recommended to continue current medication  Hyperlipidemia: (LDL goal < 100) -Controlled -Current treatment: Pravastatin 40 mg daily  -Medications previously tried:  NA  -Recommended to continue current medication  Atrial Fibrillation (Goal: prevent stroke and major bleeding) -Controlled -Current treatment: Rate control: Metoprolol XL 25 mg daily  Anticoagulation: Eliquis 2.5 mg twice daily -Medications previously tried: NA -Recommended to continue current medication  Patient Goals/Self-Care Activities Patient will:  - check blood pressure weekly, document, and provide at future appointments  Follow Up Plan: Telephone follow up appointment with care management team  member scheduled for:  09/06/2021 at 1:00 PM      Medication Assistance: None required.  Patient affirms current coverage meets needs.  Compliance/Adherence/Medication fill history: Care Gaps: Shingrix  Covid Vaccine Influenza   Star-Rating Drugs: Losartan 25 mg daily 03/11/21 for 30-DS   Patient's preferred pharmacy is:  [facility] DRUG STORE [place], [place] [address] AT [facility] [address] [place] [address] Phone: [phone] Fax: [phone]  [facility] DR[facility] STORE [place], [address] [address] Phone: [phone] Fax: [phone]  [id number] - [place], [place] [address] AT [address] [place] [address] Phone: [phone] Fax: [id number] [place], [place] [address] [place] [address] Phone: [phone] Fax: [phone]  Uses pill box? Yes Pt endorses 100% compliance  We discussed: Current pharmacy is preferred with insurance plan and patient is satisfied with pharmacy services Patient decided to: Continue current medication management strategy  Care Plan and Follow Up Patient Decision:  Patient agrees to Care Plan and  Follow-up.  Plan: Telephone follow up appointment with care management team member scheduled for:  09/06/2021 at 1:00 PM  Junius Argyle, PharmD, [place], CPP Clinical Pharmacist [facility] [employer]  [phone]

## 2021-03-11 ENCOUNTER — Other Ambulatory Visit: Payer: Self-pay | Admitting: Cardiovascular Disease

## 2021-03-11 ENCOUNTER — Other Ambulatory Visit: Payer: Self-pay

## 2021-03-11 MED ORDER — LOSARTAN POTASSIUM 25 MG PO TABS: 25.0000 mg | ORAL_TABLET | Freq: Every day | ORAL | 0 refills | Status: DC

## 2021-03-15 ENCOUNTER — Other Ambulatory Visit: Payer: Self-pay | Admitting: Family Medicine

## 2021-03-15 DIAGNOSIS — J301 Allergic rhinitis due to pollen: Secondary | ICD-10-CM

## 2021-03-18 ENCOUNTER — Telehealth: Payer: Self-pay | Admitting: Family Medicine

## 2021-03-18 ENCOUNTER — Encounter: Payer: Self-pay | Admitting: Family Medicine

## 2021-03-18 DIAGNOSIS — L97821 Non-pressure chronic ulcer of other part of left lower leg limited to breakdown of skin: Secondary | ICD-10-CM

## 2021-03-18 DIAGNOSIS — L89159 Pressure ulcer of sacral region, unspecified stage: Secondary | ICD-10-CM

## 2021-03-18 DIAGNOSIS — I83028 Varicose veins of left lower extremity with ulcer other part of lower leg: Secondary | ICD-10-CM

## 2021-03-18 NOTE — Addendum Note (Signed)
Addended by: Abelino Derrick A on: 03/18/2021 01:07 PM   Modules accepted: Orders

## 2021-03-18 NOTE — Telephone Encounter (Signed)
Refill request for doxycycline last OV 02/08/21 last prescription written for 10 day use on 03/07/21. Patients son calling for a refill. Please advise

## 2021-03-18 NOTE — Telephone Encounter (Signed)
Multiple msgs today - see patient msg/mychart msg

## 2021-03-18 NOTE — Telephone Encounter (Signed)
Pt is wanting Dr. Ethelene Hal to send him a script in for his bed sores he has on his bottom. The sores are bleeding. He wants an antibiotic cream for this issue. I informed him, he would need to be seen to get this. He wanted a message put in first. [place], [place] - [address] AT [place]  [place] [place], [place] [address]  Phone:  [phone]  Fax:  [phone]  DEA #:  [id number]

## 2021-03-18 NOTE — Telephone Encounter (Signed)
Duplicate msg - see other patient msg/mychart msg

## 2021-03-18 NOTE — Telephone Encounter (Signed)
Called Brian [name] with [facility] requesting her to contact Roxann, pt dtr, ph# [phone]. She states that pt wife gets confused at times with pts health concerns and is likely who kept rescheduling and advised HH SN not needed. Pt wound is not healing. Brian [name]/[facility] will call Roxann today and try to schedule appt for SN eval/wound care.   I have scheduled appt for 8/3 with Dr. Gena Fray (pm appt needed and Dr. Ethelene Hal out of office) as previous notes state appt needed for further treatment.

## 2021-03-18 NOTE — Telephone Encounter (Signed)
Called Brian [name] with [facility] requesting her to contact Brian [name], pt dtr, ph# [phone]. She states that pt wife gets confused at times with pts health concerns and is likely who kept rescheduling and advised HH SN not needed. Pt wound is not healing. Brian [name]/[facility] will call Brian [name] today and try to schedule appt for SN eval/wound care.  I have scheduled appt for 8/3 with Dr. Gena Fray (pm appt needed and Dr. Ethelene Hal out of office) as previous notes state appt needed for further treatment.

## 2021-03-18 NOTE — Telephone Encounter (Signed)
Pt son calling saying that they need a refill on doxycycline (VIBRA-TABS) 100 MG tablet, He said he called the pharmacy a few days ago but hadnt heard anything else about it

## 2021-03-19 DIAGNOSIS — S30810D Abrasion of lower back and pelvis, subsequent encounter: Secondary | ICD-10-CM | POA: Diagnosis not present

## 2021-03-19 DIAGNOSIS — I69354 Hemiplegia and hemiparesis following cerebral infarction affecting left non-dominant side: Secondary | ICD-10-CM | POA: Diagnosis not present

## 2021-03-19 DIAGNOSIS — L89022 Pressure ulcer of left elbow, stage 2: Secondary | ICD-10-CM | POA: Diagnosis not present

## 2021-03-19 DIAGNOSIS — G8220 Paraplegia, unspecified: Secondary | ICD-10-CM | POA: Diagnosis not present

## 2021-03-19 DIAGNOSIS — L03114 Cellulitis of left upper limb: Secondary | ICD-10-CM | POA: Diagnosis not present

## 2021-03-22 ENCOUNTER — Ambulatory Visit: Payer: Medicare Other | Admitting: Physical Therapy

## 2021-03-22 DIAGNOSIS — L89022 Pressure ulcer of left elbow, stage 2: Secondary | ICD-10-CM | POA: Diagnosis not present

## 2021-03-22 DIAGNOSIS — L03114 Cellulitis of left upper limb: Secondary | ICD-10-CM | POA: Diagnosis not present

## 2021-03-22 DIAGNOSIS — S30810D Abrasion of lower back and pelvis, subsequent encounter: Secondary | ICD-10-CM | POA: Diagnosis not present

## 2021-03-22 DIAGNOSIS — G8220 Paraplegia, unspecified: Secondary | ICD-10-CM | POA: Diagnosis not present

## 2021-03-22 DIAGNOSIS — I69354 Hemiplegia and hemiparesis following cerebral infarction affecting left non-dominant side: Secondary | ICD-10-CM | POA: Diagnosis not present

## 2021-03-23 ENCOUNTER — Ambulatory Visit (INDEPENDENT_AMBULATORY_CARE_PROVIDER_SITE_OTHER): Payer: Medicare Other | Admitting: Family Medicine

## 2021-03-23 ENCOUNTER — Other Ambulatory Visit: Payer: Self-pay

## 2021-03-23 VITALS — BP 118/70 | HR 85 | Temp 96.7°F | Ht 66.0 in

## 2021-03-23 DIAGNOSIS — L89322 Pressure ulcer of left buttock, stage 2: Secondary | ICD-10-CM | POA: Diagnosis not present

## 2021-03-23 DIAGNOSIS — L89311 Pressure ulcer of right buttock, stage 1: Secondary | ICD-10-CM

## 2021-03-23 NOTE — Progress Notes (Signed)
[facility] [address] [place] [address] Dept: [phone] Dept Fax: [phone]  Office Visit  Subjective:    Patient ID: Brian [name], male    DOB: 1932-08-24, 87 y.o..   MRN: [id number]  Chief Complaint  Patient presents with   Follow-up    F/u wound not healing on bottom LT side.   Home health care did come out.      History of Present Illness:  Patient is in today for assessment of a sore on his buttocks. He is accompanied by his son Brian [name]) and a care giver (Brian [name]). [name] has apprently had a sore on the left buttocks for the past 2 months. This has become progressively larger. He has had some bleeding at times. He also complains of pain associated wit the wound. A home health nurse did evaluate this on Monday (8/1). Currently he has a Duoderm-type dressing on the wound.  [name] is in poor overall health. He has significant cardiovascular disease. He has a history of a prior CVA and is wheelchair bound. He has to be catheterized for urination and apparently has not had incontinence. He has to be manually disimpacted for stool. [name] has chronic pain complaints due to advanced arthritis and is currently managed on Percocet. He notes this does not help his pain.   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   Obesity (BMI 30.0-34.9) 06/29/2020   Acute non-recurrent sinusitis 05/11/2020   Need for influenza vaccination 05/11/2020   Paroxysmal atrial fibrillation ([place]) 05/11/2020   Benign prostatic hyperplasia with urinary hesitancy 05/11/2020   Venous ulcer, limited to breakdown of skin ([place]) 02/06/2020   Swelling of lower extremity 02/06/2020   Left ear pain 05/20/2019   Pressure injury of buttock,  stage 1 05/20/2019   Debilitated 05/20/2019   Elevated glucose 04/08/2019   Serum potassium elevated 03/18/2019   Hyperlipidemia 09/24/2018   Gait disturbance 09/24/2018   Anxiety 04/30/2018   Lumbar radiculopathy 02/14/2018   Pain in joint of left shoulder 12/28/2017   Osteoarthritis of left glenohumeral joint 12/28/2017   Pain in left foot 10/23/2017   Coronary artery disease involving native coronary artery of native heart without angina pectoris    Persistent atrial fibrillation (HCC)    Dysarthria, post-stroke    Dysphagia, post-stroke    Cerebrovascular accident (CVA) due to thrombosis of precerebral artery ([place])    Scoliosis (and kyphoscoliosis), idiopathic 01/26/2014   Arthralgia of hip 02/07/2012   Osteoarthritis of hip 02/07/2012   Sacroiliitis ([place]) 02/07/2012   ROTATOR CUFF SYNDROME, LEFT 03/07/2010   OTH MALIG NEOPLASM SKIN OTH&UNSPEC PARTS FACE 12/06/2009   ACTINIC KERATOSIS, HEAD 09/06/2009   Osteoarthritis 02/20/2009   History of mitral valve repair 02/20/2009   HIP REPLACEMENT, TOTAL, HX OF 02/20/2009   APPENDECTOMY, HX OF 02/20/2009   HERNIORRHAPHY, HX OF 02/20/2009   EPISTAXIS, RECURRENT 11/19/2008   UNS ADVRS EFF UNS RX MEDICINAL&BIOLOGICAL SBSTNC 06/12/2008   OTHER AND UNSPECIFIED MITRAL VALVE DISEASES 03/12/2008   ATRIAL FLUTTER 03/12/2008   Essential hypertension 03/06/2007   PEPTIC ULCER DISEASE 03/06/2007   BENIGN PROSTATIC HYPERTROPHY 03/06/2007   Coronary atherosclerosis 02/13/2007   ROSACEA 02/13/2007   DVT, HX OF 02/13/2007   Past Surgical History:  Procedure Laterality Date   APPENDECTOMY  HERNIA REPAIR     IR GENERIC HISTORICAL  05/04/2016   IR PERCUTANEOUS ART THROMBECTOMY/INFUSION INTRACRANIAL INC DIAG ANGIO 05/04/2016 [name], MD [facility]   IR GENERIC HISTORICAL  05/04/2016   IR ANGIO VERTEBRAL SEL SUBCLAVIAN INNOMINATE UNI R MOD SED 05/04/2016 [name], MD [facility]   IR GENERIC HISTORICAL  06/07/2016   IR  RADIOLOGIST EVAL & MGMT 06/07/2016 [facility]   JOINT REPLACEMENT     MITRAL VALVE REPAIR     mvp repair     RADIOLOGY WITH ANESTHESIA N/A 05/04/2016   Procedure: RADIOLOGY WITH ANESTHESIA;  Surgeon: [name], MD;  Location: [facility];  Service: Radiology;  Laterality: N/A;   TOTAL HIP ARTHROPLASTY     Family History  Problem Relation Age of Onset   Rheumatic fever Mother    Coronary artery disease Father     Outpatient Medications Prior to Visit  Medication Sig Dispense Refill   acetaminophen (TYLENOL) 500 MG tablet Take 500 mg by mouth every 6 (six) hours as needed.     ALPRAZolam (XANAX) 0.25 MG tablet TAKE 1 TABLET BY MOUTH EVERY NIGHT AT BEDTIME 90 tablet 1   cetirizine (ZYRTEC) 10 MG tablet TAKE 1 TABLET(10 MG) BY MOUTH AT BEDTIME 90 tablet 2   chlorhexidine (PERIDEX) 0.12 % solution chlorhexidine gluconate 0.12 % mouthwash  SWISH GENTLY WITH 1/2 CAPFUL FOR 30 SECONDS TWICE DAILY AFTER EATING.     clotrimazole (LOTRIMIN AF) 1 % cream Apply 1 application topically 2 (two) times daily. 30 g 0   CMC-Calcium Alginate-Silver (TEGADERM ALGINATE AG DRESSING) 4" X 5" PADS Apply to sore on back side of left elbow once weekly. 16 each 1   ELIQUIS 2.5 MG TABS tablet TAKE 1 TABLET(2.5 MG) BY MOUTH TWICE DAILY 180 tablet 1   finasteride (PROSCAR) 5 MG tablet TAKE 1 TABLET(5 MG) BY MOUTH DAILY 90 tablet 3   fluticasone (FLONASE) 50 MCG/ACT nasal spray fluticasone propionate 50 mcg/actuation nasal spray,suspension     hydrocortisone (ANUCORT-HC) 25 MG suppository UNWRAP AND INSERT 1 SUPPOSITORY RECTALLY TWICE DAILY 30 suppository 10   hydrocortisone (PROCTOZONE-HC) 2.5 % rectal cream USE RECTALLY TWICE DAILY 30 g 0   hydrocortisone-pramoxine (ANALPRAM-HC) 2.5-1 % rectal cream Place 1 application rectally 2 (two) times daily as needed for hemorrhoids.      ketoconazole (NIZORAL) 2 % cream Apply thin coat to rash on inner thighs once daily for 14 days. 60 g 1   lidocaine (LIDODERM) 5 %  APPLY 1 PATCH TOPICALLY ONCE DAILY.MAY WEAR UPTO 12 HOURS MAXIMUM IN A DAY.     losartan (COZAAR) 25 MG tablet Take 1 tablet (25 mg total) by mouth daily. 30 tablet 0   metoprolol succinate (TOPROL-XL) 25 MG 24 hr tablet TAKE 1 TABLET(25 MG) BY MOUTH DAILY 90 tablet 3   Multiple Vitamin (MULTIVITAMIN) tablet Take 2 tablets by mouth daily.     oxyCODONE-acetaminophen (PERCOCET/ROXICET) 5-325 MG tablet Take 1 tablet by mouth 2 (two) times daily as needed for severe pain. 180 tablet 0   pantoprazole (PROTONIX) 40 MG tablet TAKE 1 TABLET(40 MG) BY MOUTH DAILY 90 tablet 0   polyethylene glycol powder (GLYCOLAX/MIRALAX) 17 GM/SCOOP powder Mix one scoop with water and take daily until stools loosen. May repeat as needed. 850 g 1   pravastatin (PRAVACHOL) 40 MG tablet TAKE 1 TABLET(40 MG) BY MOUTH DAILY 90 tablet 3   QUEtiapine (SEROQUEL) 50 MG tablet Take 1 tablet (50 mg total) by mouth at bedtime. 30 tablet 2  sertraline (ZOLOFT) 50 MG tablet TAKE 1 TABLET(50 MG) BY MOUTH EVERY MORNING 90 tablet 3   doxycycline (VIBRA-TABS) 100 MG tablet TAKE 1 TABLET(100 MG) BY MOUTH TWICE DAILY FOR 10 DAYS (Patient not taking: Reported on 03/23/2021) 20 tablet 0   No facility-administered medications prior to visit.   Allergies  Allergen Reactions   Novocain [Procaine Hcl]     Irregular heart beat      Other     Patient had problem with epidural with his right hip surgery. Went to up extremity instead of lower extremity    Penicillins Itching    Has patient had a PCN reaction causing immediate rash, facial/tongue/throat swelling, SOB or lightheadedness with hypotension: Unk Has patient had a PCN reaction causing severe rash involving mucus membranes or skin necrosis: Unk Has patient had a PCN reaction that required hospitalization: Unk Has patient had a PCN reaction occurring within the last 10 years: No If all of the above answers are "NO", then may proceed with Cephalosporin use.  No reaction noted    Objective:   Today's Vitals   03/23/21 1316  BP: 118/70  Pulse: 85  Temp: (!) 96.7 F (35.9 C)  TempSrc: Temporal  SpO2: 94%  Height: '5\' 6"'$  (1.676 m)   Body mass index is 28.57 kg/m.   General: Cachetic appearing elderly male. No acute distress. Ears: EAC and Tms normal bilaterally. Skin: There is a  ~ 4 cm area of partial skin breakdown on the left buttocks. There is some surrounding   nonblancheable redness. No drainage or bleeding noted currently. There is some nonblanchable redness of   the right buttocks with slight superficial peeling. There are two, mostly healed sores on the left elbow. Psych: Patient appears to have some memory issues and repeats himself frequently. Relationship with his   son appears to be strained.  Health Maintenance Due  Topic Date Due   Zoster Vaccines- Shingrix (1 of 2) Never done   COVID-19 Vaccine (4 - Booster for Pfizer series) 08/30/2020   INFLUENZA VACCINE  03/21/2021     Assessment & Plan:   1. Pressure ulcer of left buttock, stage 2 ([place]) 2. Pressure injury of right buttock, stage 1 Mr. Stefanelli is in a very debilitated state. Poor nutrition and poor circulation are likely a contributor to the development of these pressure injuries. I recommend he limit time sitting to activities that require sitting (transportation, meals, etc.). Otherwise, he should lie on either side, rotating every 2 hours. We will continue dressing changes under care of wound care nurse/home health and encourage good nutrition. I spoke with Zettie Cooley who will contact [facility] about ongoing wound care. I see no indication for antibiotics at this point. He has Percocet for pain and I am uncomfortable about adding additional therapy. I will have him follow up with Dr. Ethelene Hal within 2 weeks to reassess the wound.  I spent 35 min reviewing records and recent phone calls with the patient and family, taking history and conducting exam with family assistance, and esablishing plan  for [facility] follow-up.  Haydee Salter, MD

## 2021-03-23 NOTE — Patient Instructions (Signed)
Limit time sitting to activities that require sitting (transportation, meals, etc.) Otherwise, patient should lie on either side, rotating every 2 hours. Continue dressing changes under care of wound care nurse/home health Encourage good nutrition.

## 2021-03-23 NOTE — Telephone Encounter (Signed)
Follow up from visit today - Dr. Gena Fray asked that I check in with [facility] skilled nursing. They went /our for SN/ eval Saturday . They went out again yesterday 8/2. Pt is scheduled for the following:  8/4 OT & SN 8/8 PT & SN 8/11 PT

## 2021-03-24 ENCOUNTER — Telehealth: Payer: Self-pay | Admitting: Family Medicine

## 2021-03-24 DIAGNOSIS — G8220 Paraplegia, unspecified: Secondary | ICD-10-CM | POA: Diagnosis not present

## 2021-03-24 DIAGNOSIS — S30810D Abrasion of lower back and pelvis, subsequent encounter: Secondary | ICD-10-CM | POA: Diagnosis not present

## 2021-03-24 DIAGNOSIS — L89022 Pressure ulcer of left elbow, stage 2: Secondary | ICD-10-CM | POA: Diagnosis not present

## 2021-03-24 DIAGNOSIS — L03114 Cellulitis of left upper limb: Secondary | ICD-10-CM | POA: Diagnosis not present

## 2021-03-24 DIAGNOSIS — I69354 Hemiplegia and hemiparesis following cerebral infarction affecting left non-dominant side: Secondary | ICD-10-CM | POA: Diagnosis not present

## 2021-03-24 NOTE — Telephone Encounter (Signed)
[place] calling with pt into the office because he has a 102 temp. He also has a wound on his sacral area. I transferred him over to Nurse Triage.

## 2021-03-24 NOTE — Telephone Encounter (Signed)
Charice is calling from [place] needing verbal orders for PT. 1 time for 1 week, 2 times for 2 weeks and 1 time for 1 week. Please advise Charice at [phone], it's ok to leave a vm.

## 2021-03-25 NOTE — Telephone Encounter (Signed)
Called LM on identified VM with verbal orders asked to give [place] a call with any questions.

## 2021-03-25 NOTE — Telephone Encounter (Signed)
Pts son called to try and schedule pt for Tuesday because that's when he could come in for Temp of 100.2, sneezing and coughing and he is bleeding from a scratch on his rear end. He recently saw Dr Gena Fray for the spot on his rear end but his PCP is Dr Ethelene Hal. Please advise. Callback is [phone]

## 2021-03-25 NOTE — Telephone Encounter (Signed)
Noted. Dm/cma  

## 2021-03-25 NOTE — Telephone Encounter (Signed)
Called patient's son, [name], he put me on speaker phone and advised per Dr Gena Fray for him to go to the ER to be checked out for the fever, cough, sneezing as well as the sound check.  They agreed to check into this and also scheduled an appt with Dr Ethelene Hal on 03/29/21 @ 3:30 pm .  Dm/cma

## 2021-03-28 NOTE — Patient Instructions (Signed)
Visit Information It was great speaking with you today!  Please let me know if you have any questions about our visit.   Goals Addressed             This Visit's Progress    Track and Manage My Blood Pressure-Hypertension       Timeframe:  Long-Range Goal Priority:  High Start Date:  03/28/2021                            Expected End Date: 03/28/2022                      Follow Up Date 09/06/2021    - check blood pressure weekly    Why is this important?   You won't feel high blood pressure, but it can still hurt your blood vessels.  High blood pressure can cause heart or kidney problems. It can also cause a stroke.  Making lifestyle changes like losing a little weight or eating less salt will help.  Checking your blood pressure at home and at different times of the day can help to control blood pressure.  If the doctor prescribes medicine remember to take it the way the doctor ordered.  Call the office if you cannot afford the medicine or if there are questions about it.     Notes:         Patient Care Plan: General Pharmacy (Adult)     Problem Identified: Hypertension, Hyperlipidemia, and Atrial Fibrillation   Priority: High     Long-Range Goal: Patient-Specific Goal   Start Date: 03/28/2021  Expected End Date: 03/28/2022  This Visit's Progress: On track  Priority: High  Note:   Current Barriers:  No Barriers Noted  Pharmacist Clinical Goal(s):  Patient will maintain control of blood pressure as evidenced by BP less than 140/90  through collaboration with PharmD and provider.   Interventions: 1:1 collaboration with Libby Maw, MD regarding development and update of comprehensive plan of care as evidenced by provider attestation and co-signature Inter-disciplinary care team collaboration (see longitudinal plan of care) Comprehensive medication review performed; medication list updated in electronic medical record  Hypertension (BP goal  <140/90) -Controlled -Current treatment: Losartan 25 mg daily  Metoprolol XL 25 mg daily  -Medications previously tried: NA  -Current home readings: NA -Denies hypotensive/hypertensive symptoms -Educated on Daily salt intake goal < 2300 mg; -Counseled to monitor BP at home weekly, document, and provide log at future appointments -Recommended to continue current medication  Hyperlipidemia: (LDL goal < 100) -Controlled -Current treatment: Pravastatin 40 mg daily  -Medications previously tried: NA  -Recommended to continue current medication  Atrial Fibrillation (Goal: prevent stroke and major bleeding) -Controlled -Current treatment: Rate control: Metoprolol XL 25 mg daily  Anticoagulation: Eliquis 2.5 mg twice daily -Medications previously tried: NA -Recommended to continue current medication  Patient Goals/Self-Care Activities Patient will:  - check blood pressure weekly, document, and provide at future appointments  Follow Up Plan: Telephone follow up appointment with care management team member scheduled for:  09/06/2021 at 1:00 PM    Patient agreed to services and verbal consent obtained.   Patient verbalizes understanding of instructions provided today and agrees to view in [place].   Junius Argyle, PharmD, [place], CPP Clinical Pharmacist [facility]  [phone]

## 2021-03-29 ENCOUNTER — Encounter: Payer: Self-pay | Admitting: Family Medicine

## 2021-03-29 ENCOUNTER — Other Ambulatory Visit: Payer: Self-pay

## 2021-03-29 ENCOUNTER — Ambulatory Visit (INDEPENDENT_AMBULATORY_CARE_PROVIDER_SITE_OTHER): Payer: Medicare Other | Admitting: Family Medicine

## 2021-03-29 VITALS — BP 134/70 | HR 70 | Temp 98.2°F | Ht 66.0 in | Wt 173.0 lb

## 2021-03-29 DIAGNOSIS — L89159 Pressure ulcer of sacral region, unspecified stage: Secondary | ICD-10-CM

## 2021-03-29 DIAGNOSIS — I10 Essential (primary) hypertension: Secondary | ICD-10-CM

## 2021-03-29 DIAGNOSIS — I69354 Hemiplegia and hemiparesis following cerebral infarction affecting left non-dominant side: Secondary | ICD-10-CM | POA: Diagnosis not present

## 2021-03-29 DIAGNOSIS — S30810D Abrasion of lower back and pelvis, subsequent encounter: Secondary | ICD-10-CM | POA: Diagnosis not present

## 2021-03-29 DIAGNOSIS — L89311 Pressure ulcer of right buttock, stage 1: Secondary | ICD-10-CM | POA: Diagnosis not present

## 2021-03-29 DIAGNOSIS — L89022 Pressure ulcer of left elbow, stage 2: Secondary | ICD-10-CM | POA: Diagnosis not present

## 2021-03-29 DIAGNOSIS — L03114 Cellulitis of left upper limb: Secondary | ICD-10-CM | POA: Diagnosis not present

## 2021-03-29 DIAGNOSIS — L89322 Pressure ulcer of left buttock, stage 2: Secondary | ICD-10-CM | POA: Insufficient documentation

## 2021-03-29 DIAGNOSIS — G8220 Paraplegia, unspecified: Secondary | ICD-10-CM | POA: Diagnosis not present

## 2021-03-29 NOTE — Progress Notes (Signed)
Established Patient Office Visit  Subjective:  Patient ID: Brian [name], male    DOB: [birth year]  Age: 87 y.o. MRN: [id number]  CC:  Chief Complaint  Patient presents with   Follow-up    Follow up on pressure ulcers, states that he had a fever last week.     HPI Brian [name] presents for follow-up blood pressure sores on his buttocks.  Wound care has been coming to the house 2-3 times weekly.  Patient is accompanied by his son Brian [name] and caregiver Brian [name]  Brian [name] understands that patient needs to be turned every few hours.  Wound care nurses have applied padded dressings to the ulcerations.  He has run no fever.  Past Medical History:  Diagnosis Date   Anemia    Atrial flutter (HCC)    BPH (benign prostatic hypertrophy)    Epistaxis    Hemorrhage of gastrointestinal tract, unspecified    History of open heart surgery    Hypertension    Obesity (BMI 30.0-34.9) 06/29/2020   Osteoarthrosis, unspecified whether generalized or localized, unspecified site    Personal history of venous thrombosis and embolism    Rosacea    Stroke ([facility])    TIA (transient ischemic attack)     Past Surgical History:  Procedure Laterality Date   APPENDECTOMY     HERNIA REPAIR     IR GENERIC HISTORICAL  05/04/2016   IR PERCUTANEOUS ART THROMBECTOMY/INFUSION INTRACRANIAL INC DIAG ANGIO 05/04/2016 Brian Bras, MD [facility]   IR GENERIC HISTORICAL  05/04/2016   IR ANGIO VERTEBRAL SEL SUBCLAVIAN INNOMINATE UNI R MOD SED 05/04/2016 Brian Bras, MD [facility]   IR GENERIC HISTORICAL  06/07/2016   IR RADIOLOGIST EVAL & MGMT 06/07/2016 [facility]   JOINT REPLACEMENT     MITRAL VALVE REPAIR     mvp repair     RADIOLOGY WITH ANESTHESIA N/A 05/04/2016   Procedure: RADIOLOGY WITH ANESTHESIA;  Surgeon: Brian Bras, MD;  Location: [facility];  Service: Radiology;  Laterality: N/A;   TOTAL HIP ARTHROPLASTY      Family History  Problem Relation Age of Onset   Rheumatic fever Mother     Coronary artery disease Father     Social History   Socioeconomic History   Marital status: Married    Spouse name: Not on file   Number of children: Not on file   Years of education: Not on file   Highest education level: Not on file  Occupational History   Not on file  Tobacco Use   Smoking status: Former   Smokeless tobacco: Never   Tobacco comments:    quit 50 yr ago  Vaping Use   Vaping Use: Never used  Substance and Sexual Activity   Alcohol use: No   Drug use: No   Sexual activity: Not on file  Other Topics Concern   Not on file  Social History Narrative   Not on file   Social Determinants of Health   Financial Resource Strain: Low Risk    Difficulty of Paying Living Expenses: Not hard at all  Food Insecurity: Not on file  Transportation Needs: No Transportation Needs   Lack of Transportation (Medical): No   Lack of Transportation (Non-Medical): No  Physical Activity: Not on file  Stress: Not on file  Social Connections: Not on file  Intimate Partner Violence: Not on file    Outpatient Medications Prior to Visit  Medication Sig Dispense Refill   acetaminophen (TYLENOL) 500 MG tablet  Take 500 mg by mouth every 6 (six) hours as needed.     ALPRAZolam (XANAX) 0.25 MG tablet TAKE 1 TABLET BY MOUTH EVERY NIGHT AT BEDTIME 90 tablet 1   cetirizine (ZYRTEC) 10 MG tablet TAKE 1 TABLET(10 MG) BY MOUTH AT BEDTIME 90 tablet 2   chlorhexidine (PERIDEX) 0.12 % solution chlorhexidine gluconate 0.12 % mouthwash  SWISH GENTLY WITH 1/2 CAPFUL FOR 30 SECONDS TWICE DAILY AFTER EATING.     clotrimazole (LOTRIMIN AF) 1 % cream Apply 1 application topically 2 (two) times daily. 30 g 0   CMC-Calcium Alginate-Silver (TEGADERM ALGINATE AG DRESSING) 4" X 5" PADS Apply to sore on back side of left elbow once weekly. 16 each 1   ELIQUIS 2.5 MG TABS tablet TAKE 1 TABLET(2.5 MG) BY MOUTH TWICE DAILY 180 tablet 1   finasteride (PROSCAR) 5 MG tablet TAKE 1 TABLET(5 MG) BY MOUTH DAILY 90  tablet 3   fluticasone (FLONASE) 50 MCG/ACT nasal spray fluticasone propionate 50 mcg/actuation nasal spray,suspension     hydrocortisone (ANUCORT-HC) 25 MG suppository UNWRAP AND INSERT 1 SUPPOSITORY RECTALLY TWICE DAILY 30 suppository 10   hydrocortisone (PROCTOZONE-HC) 2.5 % rectal cream USE RECTALLY TWICE DAILY 30 g 0   hydrocortisone-pramoxine (ANALPRAM-HC) 2.5-1 % rectal cream Place 1 application rectally 2 (two) times daily as needed for hemorrhoids.      ketoconazole (NIZORAL) 2 % cream Apply thin coat to rash on inner thighs once daily for 14 days. 60 g 1   lidocaine (LIDODERM) 5 % APPLY 1 PATCH TOPICALLY ONCE DAILY.MAY WEAR UPTO 12 HOURS MAXIMUM IN A DAY.     losartan (COZAAR) 25 MG tablet Take 1 tablet (25 mg total) by mouth daily. 30 tablet 0   metoprolol succinate (TOPROL-XL) 25 MG 24 hr tablet TAKE 1 TABLET(25 MG) BY MOUTH DAILY 90 tablet 3   Multiple Vitamin (MULTIVITAMIN) tablet Take 2 tablets by mouth daily.     pantoprazole (PROTONIX) 40 MG tablet TAKE 1 TABLET(40 MG) BY MOUTH DAILY 90 tablet 0   polyethylene glycol powder (GLYCOLAX/MIRALAX) 17 GM/SCOOP powder Mix one scoop with water and take daily until stools loosen. May repeat as needed. 850 g 1   pravastatin (PRAVACHOL) 40 MG tablet TAKE 1 TABLET(40 MG) BY MOUTH DAILY 90 tablet 3   QUEtiapine (SEROQUEL) 50 MG tablet Take 1 tablet (50 mg total) by mouth at bedtime. 30 tablet 2   sertraline (ZOLOFT) 50 MG tablet TAKE 1 TABLET(50 MG) BY MOUTH EVERY MORNING 90 tablet 3   oxyCODONE-acetaminophen (PERCOCET/ROXICET) 5-325 MG tablet Take 1 tablet by mouth 2 (two) times daily as needed for severe pain. (Patient not taking: Reported on 03/29/2021) 180 tablet 0   No facility-administered medications prior to visit.    Allergies  Allergen Reactions   Novocain [Procaine Hcl]     Irregular heart beat      Other     Patient had problem with epidural with his right hip surgery. Went to up extremity instead of lower extremity     Penicillins Itching    Has patient had a PCN reaction causing immediate rash, facial/tongue/throat swelling, SOB or lightheadedness with hypotension: Unk Has patient had a PCN reaction causing severe rash involving mucus membranes or skin necrosis: Unk Has patient had a PCN reaction that required hospitalization: Unk Has patient had a PCN reaction occurring within the last 10 years: No If all of the above answers are "NO", then may proceed with Cephalosporin use.  No reaction noted    ROS Review  of Systems  Constitutional: Negative.   Eyes:  Negative for photophobia and visual disturbance.  Respiratory: Negative.    Cardiovascular: Negative.   Skin:  Positive for color change and wound.  Psychiatric/Behavioral:  Positive for confusion and decreased concentration. Negative for behavioral problems.      Objective:    Physical Exam Vitals and nursing note reviewed.  Constitutional:      General: He is not in acute distress.    Appearance: Normal appearance. He is ill-appearing. He is not toxic-appearing or diaphoretic.  HENT:     Right Ear: External ear normal.     Left Ear: External ear normal.  Eyes:     General:        Right eye: No discharge.        Left eye: No discharge.     Conjunctiva/sclera: Conjunctivae normal.  Cardiovascular:     Rate and Rhythm: Normal rate and regular rhythm.  Pulmonary:     Effort: Pulmonary effort is normal.     Breath sounds: Normal breath sounds.  Skin:      Neurological:     Mental Status: Mental status is at baseline.  Psychiatric:        Mood and Affect: Mood normal.        Behavior: Behavior normal.    BP 134/70 (BP Location: Right Arm, Patient Position: Sitting, Cuff Size: Normal)   Pulse 70   Temp 98.2 F (36.8 C) (Temporal)   Ht '5\' 6"'$  (1.676 m)   Wt 173 lb (78.5 kg)   SpO2 94%   BMI 27.92 kg/m  Wt Readings from Last 3 Encounters:  03/29/21 173 lb (78.5 kg)  02/08/21 177 lb (80.3 kg)  12/07/20 172 lb (78 kg)      Health Maintenance Due  Topic Date Due   Zoster Vaccines- Shingrix (1 of 2) Never done   INFLUENZA VACCINE  03/21/2021    There are no preventive care reminders to display for this patient.  Lab Results  Component Value Date   TSH 1.55 12/07/2020   Lab Results  Component Value Date   WBC 8.2 12/07/2020   HGB 15.3 12/07/2020   HCT 45.3 12/07/2020   MCV 100.6 (H) 12/07/2020   PLT 205.0 12/07/2020   Lab Results  Component Value Date   NA 134 (L) 12/07/2020   K 4.4 12/07/2020   CO2 29 12/07/2020   GLUCOSE 84 12/07/2020   BUN 15 12/07/2020   CREATININE 0.47 12/07/2020   BILITOT 0.5 12/07/2020   ALKPHOS 59 12/07/2020   AST 18 12/07/2020   ALT 23 12/07/2020   PROT 5.9 (L) 12/07/2020   ALBUMIN 3.7 12/07/2020   CALCIUM 9.0 12/07/2020   ANIONGAP 13 11/03/2018   GFR 94.49 12/07/2020   Lab Results  Component Value Date   CHOL 123 12/07/2020   Lab Results  Component Value Date   HDL 51.10 12/07/2020   Lab Results  Component Value Date   LDLCALC 56 12/07/2020   Lab Results  Component Value Date   TRIG 83.0 12/07/2020   Lab Results  Component Value Date   CHOLHDL 2 12/07/2020   Lab Results  Component Value Date   HGBA1C 5.1 12/07/2020      Assessment & Plan:   Problem List Items Addressed This Visit   None   No orders of the defined types were placed in this encounter.   Follow-up: No follow-ups on file.  Continue care per visiting home health nurse.  Turn patient  every 2 hours.  Follow-up next month for regularly scheduled visit.  Wounds appear to be healing.  Libby Maw, MD

## 2021-03-31 ENCOUNTER — Other Ambulatory Visit: Payer: Self-pay | Admitting: Family Medicine

## 2021-03-31 DIAGNOSIS — I69354 Hemiplegia and hemiparesis following cerebral infarction affecting left non-dominant side: Secondary | ICD-10-CM | POA: Diagnosis not present

## 2021-03-31 DIAGNOSIS — L89022 Pressure ulcer of left elbow, stage 2: Secondary | ICD-10-CM | POA: Diagnosis not present

## 2021-03-31 DIAGNOSIS — S30810D Abrasion of lower back and pelvis, subsequent encounter: Secondary | ICD-10-CM | POA: Diagnosis not present

## 2021-03-31 DIAGNOSIS — L03114 Cellulitis of left upper limb: Secondary | ICD-10-CM | POA: Diagnosis not present

## 2021-03-31 DIAGNOSIS — G8220 Paraplegia, unspecified: Secondary | ICD-10-CM | POA: Diagnosis not present

## 2021-04-01 DIAGNOSIS — I69354 Hemiplegia and hemiparesis following cerebral infarction affecting left non-dominant side: Secondary | ICD-10-CM | POA: Diagnosis not present

## 2021-04-01 DIAGNOSIS — L89022 Pressure ulcer of left elbow, stage 2: Secondary | ICD-10-CM | POA: Diagnosis not present

## 2021-04-01 DIAGNOSIS — G8220 Paraplegia, unspecified: Secondary | ICD-10-CM | POA: Diagnosis not present

## 2021-04-01 DIAGNOSIS — L03114 Cellulitis of left upper limb: Secondary | ICD-10-CM | POA: Diagnosis not present

## 2021-04-01 DIAGNOSIS — S30810D Abrasion of lower back and pelvis, subsequent encounter: Secondary | ICD-10-CM | POA: Diagnosis not present

## 2021-04-04 ENCOUNTER — Other Ambulatory Visit: Payer: Self-pay | Admitting: Family Medicine

## 2021-04-04 ENCOUNTER — Telehealth: Payer: Self-pay

## 2021-04-04 ENCOUNTER — Telehealth: Payer: Self-pay | Admitting: Family Medicine

## 2021-04-04 ENCOUNTER — Encounter: Payer: Self-pay | Admitting: Family Medicine

## 2021-04-04 DIAGNOSIS — M19012 Primary osteoarthritis, left shoulder: Secondary | ICD-10-CM

## 2021-04-04 DIAGNOSIS — M5416 Radiculopathy, lumbar region: Secondary | ICD-10-CM

## 2021-04-04 DIAGNOSIS — M169 Osteoarthritis of hip, unspecified: Secondary | ICD-10-CM

## 2021-04-04 DIAGNOSIS — M623 Immobility syndrome (paraplegic): Secondary | ICD-10-CM

## 2021-04-04 MED ORDER — OXYCODONE-ACETAMINOPHEN 5-325 MG PO TABS: 1.0000 | ORAL_TABLET | Freq: Two times a day (BID) | ORAL | 0 refills | Status: DC | PRN

## 2021-04-04 NOTE — Telephone Encounter (Signed)
What is the name of the medication? Brian (PERCOCET/ROXICET) 5-325 MG tablet [id number]   Have you contacted your pharmacy to request a refill? [name] pt's son called in needing a refill for his dad. He is completely out.   Which pharmacy would you like this sent to? Pharmacy  [facility], [place] - [address] AT [facility] [address]  [address], [place] [address]  Phone:  [phone]  Fax:  [phone]  DEA #:  [id number]     Patient notified that their request is being sent to the clinical staff for review and that they should receive a call once it is complete. If they do not receive a call within 72 hours they can check with their pharmacy or our office.

## 2021-04-04 NOTE — Telephone Encounter (Signed)
Refill request for pending Rx last OV 03/29/21 last refill 12/07/20. Please advise

## 2021-04-04 NOTE — Telephone Encounter (Signed)
Pt son called about getting a refill on doxycycline, please advise

## 2021-04-04 NOTE — Progress Notes (Signed)
Chronic Care Management Pharmacy Assistant   Name: Brian [name]  MRN: [id number] DOB: 1933-03-25  Reason for Encounter:Hypertension Disease State Call  Recent office visits:  03/29/2021 Dr.Kremer MD (PCP)  No Medication Changes Noted 03/23/2021 Dr.Rudd MD (PCP) No Medication Changes Noted  Recent consult visits:  No Recent [facility] visits:  None in previous 6 months  Medications: Outpatient Encounter Medications as of 04/04/2021  Medication Sig   acetaminophen (TYLENOL) 500 MG tablet Take 500 mg by mouth every 6 (six) hours as needed.   ALPRAZolam (XANAX) 0.25 MG tablet TAKE 1 TABLET BY MOUTH EVERY NIGHT AT BEDTIME   cetirizine (ZYRTEC) 10 MG tablet TAKE 1 TABLET(10 MG) BY MOUTH AT BEDTIME   chlorhexidine (PERIDEX) 0.12 % solution chlorhexidine gluconate 0.12 % mouthwash  SWISH GENTLY WITH 1/2 CAPFUL FOR 30 SECONDS TWICE DAILY AFTER EATING.   clotrimazole (LOTRIMIN AF) 1 % cream Apply 1 application topically 2 (two) times daily.   CMC-Calcium Alginate-Silver (TEGADERM ALGINATE AG DRESSING) 4" X 5" PADS Apply to sore on back side of left elbow once weekly.   ELIQUIS 2.5 MG TABS tablet TAKE 1 TABLET(2.5 MG) BY MOUTH TWICE DAILY   finasteride (PROSCAR) 5 MG tablet TAKE 1 TABLET(5 MG) BY MOUTH DAILY   fluticasone (FLONASE) 50 MCG/ACT nasal spray fluticasone propionate 50 mcg/actuation nasal spray,suspension   hydrocortisone (ANUCORT-HC) 25 MG suppository UNWRAP AND INSERT 1 SUPPOSITORY RECTALLY TWICE DAILY   hydrocortisone (PROCTOZONE-HC) 2.5 % rectal cream USE RECTALLY TWICE DAILY   hydrocortisone-pramoxine (ANALPRAM-HC) 2.5-1 % rectal cream Place 1 application rectally 2 (two) times daily as needed for hemorrhoids.    ketoconazole (NIZORAL) 2 % cream Apply thin coat to rash on inner thighs once daily for 14 days.   lidocaine (LIDODERM) 5 % APPLY 1 PATCH TOPICALLY ONCE DAILY.MAY WEAR UPTO 12 HOURS MAXIMUM IN A DAY.   losartan (COZAAR) 25 MG tablet Take 1 tablet (25  mg total) by mouth daily.   metoprolol succinate (TOPROL-XL) 25 MG 24 hr tablet TAKE 1 TABLET(25 MG) BY MOUTH DAILY   Multiple Vitamin (MULTIVITAMIN) tablet Take 2 tablets by mouth daily.   oxyCODONE-acetaminophen (PERCOCET/ROXICET) 5-325 MG tablet Take 1 tablet by mouth 2 (two) times daily as needed for severe pain. (Patient not taking: Reported on 03/29/2021)   pantoprazole (PROTONIX) 40 MG tablet TAKE 1 TABLET(40 MG) BY MOUTH DAILY   polyethylene glycol powder (GLYCOLAX/MIRALAX) 17 GM/SCOOP powder Mix one scoop with water and take daily until stools loosen. May repeat as needed.   pravastatin (PRAVACHOL) 40 MG tablet TAKE 1 TABLET(40 MG) BY MOUTH DAILY   QUEtiapine (SEROQUEL) 50 MG tablet Take 1 tablet (50 mg total) by mouth at bedtime.   sertraline (ZOLOFT) 50 MG tablet TAKE 1 TABLET(50 MG) BY MOUTH EVERY MORNING   No facility-administered encounter medications on file as of 04/04/2021.    Care Gaps: Shingrix vaccine Influenza Vaccine Star Rating Drugs: Losartan 25 mg last filled on 03/11/2021 30 day supply at [facility]. Pravastatin 40 mg last filled on 02/02/2021 30 day supply at [facility]. Medication Fill Gaps: ELIQUIS 2.5 MG last filled on 11/20/2020 30 day supply  Reviewed chart prior to disease state call. Spoke with patient regarding BP  Recent Office Vitals: BP Readings from Last 3 Encounters:  03/29/21 134/70  03/23/21 118/70  02/08/21 116/62   Pulse Readings from Last 3 Encounters:  03/29/21 70  03/23/21 85  02/08/21 66    Wt Readings from Last 3 Encounters:  03/29/21 173  lb (78.5 kg)  02/08/21 177 lb (80.3 kg)  12/07/20 172 lb (78 kg)     Kidney Function Lab Results  Component Value Date/Time   CREATININE 0.47 12/07/2020 03:11 PM   CREATININE 0.68 05/11/2020 02:06 PM   CREATININE 0.63 (L) 02/06/2020 05:06 PM   CREATININE 0.64 (L) 10/03/2016 11:57 AM   GFR 94.49 12/07/2020 03:11 PM   GFRNONAA >60 11/03/2018 07:35 PM   GFRNONAA >60  01/23/2011 09:20 AM   GFRAA >60 11/03/2018 07:35 PM   GFRAA >60 01/23/2011 09:20 AM    BMP Latest Ref Rng & Units 12/07/2020 05/11/2020 02/06/2020  Glucose 70 - 99 mg/dL 84 96 99  BUN 6 - 23 mg/dL '15 16 12  '$ Creatinine 0.40 - 1.50 mg/dL 0.47 0.68 0.63(L)  BUN/Creat Ratio 6 - 22 (calc) - - 19  Sodium 135 - 145 mEq/L 134(L) 135 136  Potassium 3.5 - 5.1 mEq/L 4.4 4.6 4.6  Chloride 96 - 112 mEq/L 98 101 100  CO2 19 - 32 mEq/L '29 29 28  '$ Calcium 8.4 - 10.5 mg/dL 9.0 9.2 8.9    Current antihypertensive regimen:  Losartan 25 mg daily  Metoprolol XL 25 mg daily   Patient wife states patient denies headaches,dizziness or lightheadedness.  How often are you checking your Blood Pressure? 1-2x per week Current home BP readings:  On 04/03/2021 it was 96/58. On 03/31/2021 it was 130/84. On 03/27/2021 it was 108/64. What recent interventions/DTPs have been made by any provider to improve Blood Pressure control since last CPP Visit: None ID Any recent hospitalizations or ED visits since last visit with CPP? No What diet changes have been made to improve Blood Pressure Control?  None ID What exercise is being done to improve your Blood Pressure Control?  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  Adherence Review: Is the patient currently on ACE/ARB medication? Yes Does the patient have >5 day gap between last estimated fill dates? No   Anderson Malta Clinical Production designer, theatre/television/film [phone]

## 2021-04-04 NOTE — Telephone Encounter (Signed)
Please advise message below  °

## 2021-04-05 DIAGNOSIS — I69354 Hemiplegia and hemiparesis following cerebral infarction affecting left non-dominant side: Secondary | ICD-10-CM | POA: Diagnosis not present

## 2021-04-05 DIAGNOSIS — S30810D Abrasion of lower back and pelvis, subsequent encounter: Secondary | ICD-10-CM | POA: Diagnosis not present

## 2021-04-05 DIAGNOSIS — G8220 Paraplegia, unspecified: Secondary | ICD-10-CM | POA: Diagnosis not present

## 2021-04-05 DIAGNOSIS — L03114 Cellulitis of left upper limb: Secondary | ICD-10-CM | POA: Diagnosis not present

## 2021-04-05 DIAGNOSIS — L89022 Pressure ulcer of left elbow, stage 2: Secondary | ICD-10-CM | POA: Diagnosis not present

## 2021-04-05 NOTE — Telephone Encounter (Signed)
Patient and son aware that requested Rx was not able to be called in at this time. Both verbally understood that the antibiotic that they are requesting should not be a every day/permanent medication.

## 2021-04-06 ENCOUNTER — Encounter: Payer: Self-pay | Admitting: Family Medicine

## 2021-04-07 DIAGNOSIS — L89022 Pressure ulcer of left elbow, stage 2: Secondary | ICD-10-CM | POA: Diagnosis not present

## 2021-04-07 DIAGNOSIS — S30810D Abrasion of lower back and pelvis, subsequent encounter: Secondary | ICD-10-CM | POA: Diagnosis not present

## 2021-04-07 DIAGNOSIS — L03114 Cellulitis of left upper limb: Secondary | ICD-10-CM | POA: Diagnosis not present

## 2021-04-07 DIAGNOSIS — G8220 Paraplegia, unspecified: Secondary | ICD-10-CM | POA: Diagnosis not present

## 2021-04-07 DIAGNOSIS — I69354 Hemiplegia and hemiparesis following cerebral infarction affecting left non-dominant side: Secondary | ICD-10-CM | POA: Diagnosis not present

## 2021-04-08 ENCOUNTER — Telehealth: Payer: Self-pay | Admitting: Family Medicine

## 2021-04-08 DIAGNOSIS — S30810D Abrasion of lower back and pelvis, subsequent encounter: Secondary | ICD-10-CM | POA: Diagnosis not present

## 2021-04-08 DIAGNOSIS — I69354 Hemiplegia and hemiparesis following cerebral infarction affecting left non-dominant side: Secondary | ICD-10-CM | POA: Diagnosis not present

## 2021-04-08 DIAGNOSIS — G8220 Paraplegia, unspecified: Secondary | ICD-10-CM | POA: Diagnosis not present

## 2021-04-08 DIAGNOSIS — L03114 Cellulitis of left upper limb: Secondary | ICD-10-CM | POA: Diagnosis not present

## 2021-04-08 DIAGNOSIS — L89022 Pressure ulcer of left elbow, stage 2: Secondary | ICD-10-CM | POA: Diagnosis not present

## 2021-04-08 NOTE — Telephone Encounter (Signed)
Pt's son(Lance) is wanting Dr. Ethelene Hal to call his dad, so he can get more PT. He would like a call as soon as you can get to it. [phone]

## 2021-04-09 ENCOUNTER — Encounter: Payer: Self-pay | Admitting: Family Medicine

## 2021-04-10 ENCOUNTER — Other Ambulatory Visit: Payer: Self-pay | Admitting: Cardiovascular Disease

## 2021-04-11 ENCOUNTER — Other Ambulatory Visit: Payer: Self-pay | Admitting: Cardiovascular Disease

## 2021-04-11 NOTE — Telephone Encounter (Signed)
Pt is overdue for 6 month follow-up with Dr. Oval Linsey. Please call pt to schedule for refills. Thank you!

## 2021-04-11 NOTE — Telephone Encounter (Signed)
Rx(s) sent to pharmacy electronically.  

## 2021-04-12 DIAGNOSIS — G8220 Paraplegia, unspecified: Secondary | ICD-10-CM | POA: Diagnosis not present

## 2021-04-12 DIAGNOSIS — L03114 Cellulitis of left upper limb: Secondary | ICD-10-CM | POA: Diagnosis not present

## 2021-04-12 DIAGNOSIS — I69354 Hemiplegia and hemiparesis following cerebral infarction affecting left non-dominant side: Secondary | ICD-10-CM | POA: Diagnosis not present

## 2021-04-12 DIAGNOSIS — L89022 Pressure ulcer of left elbow, stage 2: Secondary | ICD-10-CM | POA: Diagnosis not present

## 2021-04-12 DIAGNOSIS — S30810D Abrasion of lower back and pelvis, subsequent encounter: Secondary | ICD-10-CM | POA: Diagnosis not present

## 2021-04-12 NOTE — Telephone Encounter (Signed)
Here you go

## 2021-04-12 NOTE — Telephone Encounter (Signed)
Returned patients call patient picked up the phone was not able to hear me he hung up. I tried calling back VM picking up every time I call back.

## 2021-04-12 NOTE — Telephone Encounter (Signed)
Returned patients call, patient answered the call but could not hear me. Tried calling back no answer VM on first ring.

## 2021-04-12 NOTE — Telephone Encounter (Signed)
Duplicate message. 

## 2021-04-14 ENCOUNTER — Telehealth: Payer: Self-pay | Admitting: Family Medicine

## 2021-04-14 DIAGNOSIS — S30810D Abrasion of lower back and pelvis, subsequent encounter: Secondary | ICD-10-CM | POA: Diagnosis not present

## 2021-04-14 DIAGNOSIS — L03114 Cellulitis of left upper limb: Secondary | ICD-10-CM | POA: Diagnosis not present

## 2021-04-14 DIAGNOSIS — L89022 Pressure ulcer of left elbow, stage 2: Secondary | ICD-10-CM | POA: Diagnosis not present

## 2021-04-14 DIAGNOSIS — G8220 Paraplegia, unspecified: Secondary | ICD-10-CM | POA: Diagnosis not present

## 2021-04-14 DIAGNOSIS — I69354 Hemiplegia and hemiparesis following cerebral infarction affecting left non-dominant side: Secondary | ICD-10-CM | POA: Diagnosis not present

## 2021-04-14 NOTE — Telephone Encounter (Signed)
Brian [name] with [facility] calling requesting verbal orders Home health PT 2 week 2, 1 week 2 [phone]

## 2021-04-15 DIAGNOSIS — L03114 Cellulitis of left upper limb: Secondary | ICD-10-CM | POA: Diagnosis not present

## 2021-04-15 DIAGNOSIS — S30810D Abrasion of lower back and pelvis, subsequent encounter: Secondary | ICD-10-CM | POA: Diagnosis not present

## 2021-04-15 DIAGNOSIS — G8220 Paraplegia, unspecified: Secondary | ICD-10-CM | POA: Diagnosis not present

## 2021-04-15 DIAGNOSIS — I69354 Hemiplegia and hemiparesis following cerebral infarction affecting left non-dominant side: Secondary | ICD-10-CM | POA: Diagnosis not present

## 2021-04-15 DIAGNOSIS — L89022 Pressure ulcer of left elbow, stage 2: Secondary | ICD-10-CM | POA: Diagnosis not present

## 2021-04-18 ENCOUNTER — Other Ambulatory Visit: Payer: Self-pay | Admitting: Family Medicine

## 2021-04-18 ENCOUNTER — Telehealth: Payer: Self-pay | Admitting: Family Medicine

## 2021-04-18 ENCOUNTER — Encounter: Payer: Self-pay | Admitting: Family Medicine

## 2021-04-18 ENCOUNTER — Encounter (HOSPITAL_BASED_OUTPATIENT_CLINIC_OR_DEPARTMENT_OTHER): Payer: Self-pay

## 2021-04-18 DIAGNOSIS — I480 Paroxysmal atrial fibrillation: Secondary | ICD-10-CM

## 2021-04-18 DIAGNOSIS — I6300 Cerebral infarction due to thrombosis of unspecified precerebral artery: Secondary | ICD-10-CM

## 2021-04-18 DIAGNOSIS — G8220 Paraplegia, unspecified: Secondary | ICD-10-CM | POA: Diagnosis not present

## 2021-04-18 DIAGNOSIS — S30810D Abrasion of lower back and pelvis, subsequent encounter: Secondary | ICD-10-CM | POA: Diagnosis not present

## 2021-04-18 DIAGNOSIS — I69354 Hemiplegia and hemiparesis following cerebral infarction affecting left non-dominant side: Secondary | ICD-10-CM | POA: Diagnosis not present

## 2021-04-18 DIAGNOSIS — L89022 Pressure ulcer of left elbow, stage 2: Secondary | ICD-10-CM | POA: Diagnosis not present

## 2021-04-18 DIAGNOSIS — L03114 Cellulitis of left upper limb: Secondary | ICD-10-CM | POA: Diagnosis not present

## 2021-04-18 DIAGNOSIS — Z23 Encounter for immunization: Secondary | ICD-10-CM | POA: Diagnosis not present

## 2021-04-18 NOTE — Telephone Encounter (Signed)
FYI: verbal orders for PT given per [facility] they will also send over written order.

## 2021-04-18 NOTE — Telephone Encounter (Signed)
Done

## 2021-04-18 NOTE — Telephone Encounter (Signed)
What is the name of the medication? Brian [name] 2.5 MG TABS tablet [id number]   Have you contacted your pharmacy to request a refill? He needs a refill, he's almost out.  Which pharmacy would you like this sent to? Pharmacy  [facility], [place] - [address] AT [facility] [place]  [address], [place] [address]  Phone:  [phone]  Fax:  [phone]  DEA #:  [id number]     Patient notified that their request is being sent to the clinical staff for review and that they should receive a call once it is complete. If they do not receive a call within 72 hours they can check with their pharmacy or our office.

## 2021-04-19 DIAGNOSIS — L03114 Cellulitis of left upper limb: Secondary | ICD-10-CM | POA: Diagnosis not present

## 2021-04-19 DIAGNOSIS — L89022 Pressure ulcer of left elbow, stage 2: Secondary | ICD-10-CM | POA: Diagnosis not present

## 2021-04-19 DIAGNOSIS — S30810D Abrasion of lower back and pelvis, subsequent encounter: Secondary | ICD-10-CM | POA: Diagnosis not present

## 2021-04-19 DIAGNOSIS — G8220 Paraplegia, unspecified: Secondary | ICD-10-CM | POA: Diagnosis not present

## 2021-04-19 DIAGNOSIS — I69354 Hemiplegia and hemiparesis following cerebral infarction affecting left non-dominant side: Secondary | ICD-10-CM | POA: Diagnosis not present

## 2021-04-21 DIAGNOSIS — G8220 Paraplegia, unspecified: Secondary | ICD-10-CM | POA: Diagnosis not present

## 2021-04-21 DIAGNOSIS — I69354 Hemiplegia and hemiparesis following cerebral infarction affecting left non-dominant side: Secondary | ICD-10-CM | POA: Diagnosis not present

## 2021-04-21 DIAGNOSIS — L03114 Cellulitis of left upper limb: Secondary | ICD-10-CM | POA: Diagnosis not present

## 2021-04-21 DIAGNOSIS — L89022 Pressure ulcer of left elbow, stage 2: Secondary | ICD-10-CM | POA: Diagnosis not present

## 2021-04-21 DIAGNOSIS — S30810D Abrasion of lower back and pelvis, subsequent encounter: Secondary | ICD-10-CM | POA: Diagnosis not present

## 2021-04-22 DIAGNOSIS — L89022 Pressure ulcer of left elbow, stage 2: Secondary | ICD-10-CM | POA: Diagnosis not present

## 2021-04-22 DIAGNOSIS — M6281 Muscle weakness (generalized): Secondary | ICD-10-CM | POA: Diagnosis not present

## 2021-04-22 DIAGNOSIS — L03114 Cellulitis of left upper limb: Secondary | ICD-10-CM | POA: Diagnosis not present

## 2021-04-22 DIAGNOSIS — I69354 Hemiplegia and hemiparesis following cerebral infarction affecting left non-dominant side: Secondary | ICD-10-CM | POA: Diagnosis not present

## 2021-04-22 DIAGNOSIS — S30810D Abrasion of lower back and pelvis, subsequent encounter: Secondary | ICD-10-CM | POA: Diagnosis not present

## 2021-04-22 DIAGNOSIS — I10 Essential (primary) hypertension: Secondary | ICD-10-CM | POA: Diagnosis not present

## 2021-04-22 DIAGNOSIS — M1990 Unspecified osteoarthritis, unspecified site: Secondary | ICD-10-CM | POA: Diagnosis not present

## 2021-04-22 DIAGNOSIS — G8220 Paraplegia, unspecified: Secondary | ICD-10-CM | POA: Diagnosis not present

## 2021-04-22 DIAGNOSIS — Z7401 Bed confinement status: Secondary | ICD-10-CM | POA: Diagnosis not present

## 2021-04-27 ENCOUNTER — Telehealth: Payer: Self-pay

## 2021-04-27 NOTE — Telephone Encounter (Signed)
Left VM with patient's, wife to rtn call. Also given his work number to call [phone]. Dm/cma

## 2021-04-28 ENCOUNTER — Telehealth: Payer: Self-pay | Admitting: Family Medicine

## 2021-04-28 DIAGNOSIS — G8220 Paraplegia, unspecified: Secondary | ICD-10-CM | POA: Diagnosis not present

## 2021-04-28 DIAGNOSIS — I69354 Hemiplegia and hemiparesis following cerebral infarction affecting left non-dominant side: Secondary | ICD-10-CM | POA: Diagnosis not present

## 2021-04-28 DIAGNOSIS — L03114 Cellulitis of left upper limb: Secondary | ICD-10-CM | POA: Diagnosis not present

## 2021-04-28 DIAGNOSIS — S30810D Abrasion of lower back and pelvis, subsequent encounter: Secondary | ICD-10-CM | POA: Diagnosis not present

## 2021-04-28 DIAGNOSIS — L89022 Pressure ulcer of left elbow, stage 2: Secondary | ICD-10-CM | POA: Diagnosis not present

## 2021-04-28 NOTE — Telephone Encounter (Signed)
Benjamine Mola, nurse with [facility] called to inform Dr Ethelene Hal that Pt is reporting burning with urination. This has been going on for a few days. No confusion. No pain. Urine is clear/yellow. Vitals are normal.

## 2021-04-28 NOTE — Telephone Encounter (Signed)
FYI: please advise message below. Will patient be need to coe in for urinalysis?

## 2021-04-29 DIAGNOSIS — G8220 Paraplegia, unspecified: Secondary | ICD-10-CM | POA: Diagnosis not present

## 2021-04-29 DIAGNOSIS — L89022 Pressure ulcer of left elbow, stage 2: Secondary | ICD-10-CM | POA: Diagnosis not present

## 2021-04-29 DIAGNOSIS — I69354 Hemiplegia and hemiparesis following cerebral infarction affecting left non-dominant side: Secondary | ICD-10-CM | POA: Diagnosis not present

## 2021-04-29 DIAGNOSIS — L03114 Cellulitis of left upper limb: Secondary | ICD-10-CM | POA: Diagnosis not present

## 2021-04-29 DIAGNOSIS — S30810D Abrasion of lower back and pelvis, subsequent encounter: Secondary | ICD-10-CM | POA: Diagnosis not present

## 2021-04-29 NOTE — Telephone Encounter (Signed)
Called spoke with wife who states that she will relay the message to patient.

## 2021-05-03 ENCOUNTER — Other Ambulatory Visit: Payer: Self-pay | Admitting: Family Medicine

## 2021-05-03 ENCOUNTER — Encounter: Payer: Self-pay | Admitting: Family Medicine

## 2021-05-03 ENCOUNTER — Ambulatory Visit (INDEPENDENT_AMBULATORY_CARE_PROVIDER_SITE_OTHER): Payer: Medicare Other | Admitting: Family Medicine

## 2021-05-03 ENCOUNTER — Other Ambulatory Visit: Payer: Self-pay

## 2021-05-03 VITALS — BP 120/62 | HR 68 | Temp 97.8°F | Ht 66.0 in | Wt 180.0 lb

## 2021-05-03 DIAGNOSIS — I10 Essential (primary) hypertension: Secondary | ICD-10-CM

## 2021-05-03 DIAGNOSIS — L89322 Pressure ulcer of left buttock, stage 2: Secondary | ICD-10-CM

## 2021-05-03 DIAGNOSIS — G4709 Other insomnia: Secondary | ICD-10-CM

## 2021-05-03 DIAGNOSIS — M623 Immobility syndrome (paraplegic): Secondary | ICD-10-CM

## 2021-05-03 DIAGNOSIS — L89311 Pressure ulcer of right buttock, stage 1: Secondary | ICD-10-CM | POA: Diagnosis not present

## 2021-05-03 DIAGNOSIS — R5381 Other malaise: Secondary | ICD-10-CM | POA: Diagnosis not present

## 2021-05-03 LAB — COMPREHENSIVE METABOLIC PANEL
ALT: 12 U/L (ref 0–53)
AST: 15 U/L (ref 0–37)
Albumin: 3.8 g/dL (ref 3.5–5.2)
Alkaline Phosphatase: 63 U/L (ref 39–117)
BUN: 13 mg/dL (ref 6–23)
CO2: 28 mEq/L (ref 19–32)
Calcium: 8.8 mg/dL (ref 8.4–10.5)
Chloride: 102 mEq/L (ref 96–112)
Creatinine, Ser: 0.56 mg/dL (ref 0.40–1.50)
GFR: 89.37 mL/min (ref >60.00)
Glucose, Bld: 98 mg/dL (ref 70–99)
Potassium: 4.1 mEq/L (ref 3.5–5.1)
Sodium: 136 mEq/L (ref 135–145)
Total Bilirubin: 0.5 mg/dL (ref 0.2–1.2)
Total Protein: 6 g/dL (ref 6.0–8.3)

## 2021-05-03 LAB — CBC
HCT: 43.2 % (ref 39.0–52.0)
Hemoglobin: 13.9 g/dL (ref 13.0–17.0)
MCHC: 32.3 g/dL (ref 30.0–36.0)
MCV: 99.3 fl (ref 78.0–100.0)
Platelets: 212 10*3/uL (ref 150.0–400.0)
RBC: 4.34 Mil/uL (ref 4.22–5.81)
RDW: 13.9 % (ref 11.5–15.5)
WBC: 7.1 10*3/uL (ref 4.0–10.5)

## 2021-05-03 NOTE — Progress Notes (Addendum)
Established Patient Office Visit  Subjective:  Patient ID: Brian [name], male    DOB: July 14, 1933  Age: 87 y.o. MRN: [id number]  CC:  Chief Complaint  Patient presents with   Follow-up    HPI Brian [name] presents for follow-up of pressure sores, hypertension and debilitation.  No longer taking alprazolam in favor of Seroquel.  He is sleeping through the night.  Continues with physical therapy.  Wound care is coming twice a week.  He is accompanied by his son and therapy aide.  His aide maintains that they are turning him frequently.  Past Medical History:  Diagnosis Date   Anemia    Atrial flutter (HCC)    BPH (benign prostatic hypertrophy)    Epistaxis    Hemorrhage of gastrointestinal tract, unspecified    History of open heart surgery    Hypertension    Obesity (BMI 30.0-34.9) 06/29/2020   Osteoarthrosis, unspecified whether generalized or localized, unspecified site    Personal history of venous thrombosis and embolism    Rosacea    Stroke ([place])    TIA (transient ischemic attack)     Past Surgical History:  Procedure Laterality Date   APPENDECTOMY     HERNIA REPAIR     IR GENERIC HISTORICAL  05/04/2016   IR PERCUTANEOUS ART THROMBECTOMY/INFUSION INTRACRANIAL INC DIAG ANGIO 05/04/2016 [name], MD [facility]   IR GENERIC HISTORICAL  05/04/2016   IR ANGIO VERTEBRAL SEL SUBCLAVIAN INNOMINATE UNI R MOD SED 05/04/2016 [name], MD [facility]   IR GENERIC HISTORICAL  06/07/2016   IR RADIOLOGIST EVAL & MGMT 06/07/2016 [facility]   JOINT REPLACEMENT     MITRAL VALVE REPAIR     mvp repair     RADIOLOGY WITH ANESTHESIA N/A 05/04/2016   Procedure: RADIOLOGY WITH ANESTHESIA;  Surgeon: [name], MD;  Location: [facility];  Service: Radiology;  Laterality: N/A;   TOTAL HIP ARTHROPLASTY      Family History  Problem Relation Age of Onset   Rheumatic fever Mother    Coronary artery disease Father     Social History   Socioeconomic History    Marital status: Married    Spouse name: Not on file   Number of children: Not on file   Years of education: Not on file   Highest education level: Not on file  Occupational History   Not on file  Tobacco Use   Smoking status: Former   Smokeless tobacco: Never   Tobacco comments:    quit 50 yr ago  Vaping Use   Vaping Use: Never used  Substance and Sexual Activity   Alcohol use: No   Drug use: No   Sexual activity: Not on file  Other Topics Concern   Not on file  Social History Narrative   Not on file   Social Determinants of Health   Financial Resource Strain: Low Risk    Difficulty of Paying Living Expenses: Not hard at all  Food Insecurity: Not on file  Transportation Needs: No Transportation Needs   Lack of Transportation (Medical): No   Lack of Transportation (Non-Medical): No  Physical Activity: Not on file  Stress: Not on file  Social Connections: Not on file  Intimate Partner Violence: Not on file    Outpatient Medications Prior to Visit  Medication Sig Dispense Refill   acetaminophen (TYLENOL) 500 MG tablet Take 500 mg by mouth every 6 (six) hours as needed.     cetirizine (ZYRTEC) 10 MG tablet  TAKE 1 TABLET(10 MG) BY MOUTH AT BEDTIME 90 tablet 2   CMC-Calcium Alginate-Silver (TEGADERM ALGINATE AG DRESSING) 4" X 5" PADS Apply to sore on back side of left elbow once weekly. 16 each 1   ELIQUIS 2.5 MG TABS tablet TAKE 1 TABLET(2.5 MG) BY MOUTH TWICE DAILY 180 tablet 1   finasteride (PROSCAR) 5 MG tablet TAKE 1 TABLET(5 MG) BY MOUTH DAILY 90 tablet 0   hydrocortisone (ANUCORT-HC) 25 MG suppository UNWRAP AND INSERT 1 SUPPOSITORY RECTALLY TWICE DAILY 30 suppository 10   hydrocortisone (PROCTOZONE-HC) 2.5 % rectal cream USE RECTALLY TWICE DAILY 30 g 0   hydrocortisone-pramoxine (ANALPRAM-HC) 2.5-1 % rectal cream Place 1 application rectally 2 (two) times daily as needed for hemorrhoids.      losartan (COZAAR) 25 MG tablet TAKE 1 TABLET(25 MG) BY MOUTH DAILY 30 tablet  0   metoprolol succinate (TOPROL-XL) 25 MG 24 hr tablet TAKE 1 TABLET(25 MG) BY MOUTH DAILY 90 tablet 3   Multiple Vitamin (MULTIVITAMIN) tablet Take 2 tablets by mouth daily.     oxyCODONE-acetaminophen (PERCOCET/ROXICET) 5-325 MG tablet Take 1 tablet by mouth 2 (two) times daily as needed for severe pain. 180 tablet 0   pantoprazole (PROTONIX) 40 MG tablet TAKE 1 TABLET(40 MG) BY MOUTH DAILY 90 tablet 0   polyethylene glycol powder (GLYCOLAX/MIRALAX) 17 GM/SCOOP powder Mix one scoop with water and take daily until stools loosen. May repeat as needed. 850 g 1   pravastatin (PRAVACHOL) 40 MG tablet TAKE 1 TABLET(40 MG) BY MOUTH DAILY 90 tablet 3   QUEtiapine (SEROQUEL) 50 MG tablet TAKE 1 TABLET(50 MG) BY MOUTH AT BEDTIME 30 tablet 2   sertraline (ZOLOFT) 50 MG tablet TAKE 1 TABLET(50 MG) BY MOUTH EVERY MORNING 90 tablet 3   ALPRAZolam (XANAX) 0.25 MG tablet TAKE 1 TABLET BY MOUTH EVERY NIGHT AT BEDTIME 90 tablet 1   chlorhexidine (PERIDEX) 0.12 % solution chlorhexidine gluconate 0.12 % mouthwash  SWISH GENTLY WITH 1/2 CAPFUL FOR 30 SECONDS TWICE DAILY AFTER EATING.     clotrimazole (LOTRIMIN AF) 1 % cream Apply 1 application topically 2 (two) times daily. 30 g 0   fluticasone (FLONASE) 50 MCG/ACT nasal spray fluticasone propionate 50 mcg/actuation nasal spray,suspension     ketoconazole (NIZORAL) 2 % cream Apply thin coat to rash on inner thighs once daily for 14 days. 60 g 1   lidocaine (LIDODERM) 5 % APPLY 1 PATCH TOPICALLY ONCE DAILY.MAY WEAR UPTO 12 HOURS MAXIMUM IN A DAY.     No facility-administered medications prior to visit.    Allergies  Allergen Reactions   Novocain [Procaine Hcl]     Irregular heart beat      Other     Patient had problem with epidural with his right hip surgery. Went to up extremity instead of lower extremity    Penicillins Itching    Has patient had a PCN reaction causing immediate rash, facial/tongue/throat swelling, SOB or lightheadedness with  hypotension: Unk Has patient had a PCN reaction causing severe rash involving mucus membranes or skin necrosis: Unk Has patient had a PCN reaction that required hospitalization: Unk Has patient had a PCN reaction occurring within the last 10 years: No If all of the above answers are "NO", then may proceed with Cephalosporin use.  No reaction noted    ROS Review of Systems  Constitutional: Negative.   Respiratory: Negative.    Cardiovascular: Negative.   Gastrointestinal: Negative.   Skin:  Positive for wound.  Neurological:  Positive for  weakness.  Psychiatric/Behavioral:  Positive for confusion.      Objective:    Physical Exam Vitals and nursing note reviewed.  Constitutional:      General: He is not in acute distress.    Appearance: He is ill-appearing. He is not toxic-appearing.  HENT:     Head: Normocephalic and atraumatic.  Eyes:     Conjunctiva/sclera: Conjunctivae normal.  Cardiovascular:     Rate and Rhythm: Normal rate and regular rhythm.  Pulmonary:     Effort: Pulmonary effort is normal.     Breath sounds: Normal breath sounds.  Abdominal:     General: Bowel sounds are normal.  Skin:      Neurological:     Mental Status: Mental status is at baseline.  Psychiatric:        Mood and Affect: Mood normal.        Behavior: Behavior normal.    BP 120/62   Pulse 68   Temp 97.8 F (36.6 C) (Skin)   Ht '5\' 6"'$  (1.676 m)   Wt 180 lb (81.6 kg)   SpO2 96%   BMI 29.05 kg/m  Wt Readings from Last 3 Encounters:  05/03/21 180 lb (81.6 kg)  03/29/21 173 lb (78.5 kg)  02/08/21 177 lb (80.3 kg)     Health Maintenance Due  Topic Date Due   Zoster Vaccines- Shingrix (1 of 2) Never done    There are no preventive care reminders to display for this patient.  Lab Results  Component Value Date   TSH 1.55 12/07/2020   Lab Results  Component Value Date   WBC 8.2 12/07/2020   HGB 15.3 12/07/2020   HCT 45.3 12/07/2020   MCV 100.6 (H) 12/07/2020   PLT 205.0  12/07/2020   Lab Results  Component Value Date   NA 134 (L) 12/07/2020   K 4.4 12/07/2020   CO2 29 12/07/2020   GLUCOSE 84 12/07/2020   BUN 15 12/07/2020   CREATININE 0.47 12/07/2020   BILITOT 0.5 12/07/2020   ALKPHOS 59 12/07/2020   AST 18 12/07/2020   ALT 23 12/07/2020   PROT 5.9 (L) 12/07/2020   ALBUMIN 3.7 12/07/2020   CALCIUM 9.0 12/07/2020   ANIONGAP 13 11/03/2018   GFR 94.49 12/07/2020   Lab Results  Component Value Date   CHOL 123 12/07/2020   Lab Results  Component Value Date   HDL 51.10 12/07/2020   Lab Results  Component Value Date   LDLCALC 56 12/07/2020   Lab Results  Component Value Date   TRIG 83.0 12/07/2020   Lab Results  Component Value Date   CHOLHDL 2 12/07/2020   Lab Results  Component Value Date   HGBA1C 5.1 12/07/2020      Assessment & Plan:   Problem List Items Addressed This Visit       Cardiovascular and Mediastinum   Essential hypertension   Relevant Orders   CBC   Comprehensive metabolic panel     Musculoskeletal and Integument   Pressure injury of right buttock, stage 1   Pressure ulcer of left buttock, stage 2 ([place]) - Primary     Other   Debilitated    No orders of the defined types were placed in this encounter.   Follow-up: Return in about 3 months (around 08/02/2021), or Continue wound care with frequent change in position and PT.Marland Kitchen  Continue physical therapy and wound care.  Continue frequent position changes.  Libby Maw, MD  Addendum: BPH symptoms with difficulty  starting stream. Have sent Rx for tamsulosin.

## 2021-05-05 ENCOUNTER — Telehealth: Payer: Self-pay | Admitting: Family Medicine

## 2021-05-05 DIAGNOSIS — G8220 Paraplegia, unspecified: Secondary | ICD-10-CM | POA: Diagnosis not present

## 2021-05-05 DIAGNOSIS — N401 Enlarged prostate with lower urinary tract symptoms: Secondary | ICD-10-CM

## 2021-05-05 DIAGNOSIS — L03114 Cellulitis of left upper limb: Secondary | ICD-10-CM | POA: Diagnosis not present

## 2021-05-05 DIAGNOSIS — S30810D Abrasion of lower back and pelvis, subsequent encounter: Secondary | ICD-10-CM | POA: Diagnosis not present

## 2021-05-05 DIAGNOSIS — I69354 Hemiplegia and hemiparesis following cerebral infarction affecting left non-dominant side: Secondary | ICD-10-CM | POA: Diagnosis not present

## 2021-05-05 DIAGNOSIS — L89022 Pressure ulcer of left elbow, stage 2: Secondary | ICD-10-CM | POA: Diagnosis not present

## 2021-05-05 MED ORDER — TAMSULOSIN HCL 0.4 MG PO CAPS: 0.4000 mg | ORAL_CAPSULE | Freq: Every day | ORAL | 3 refills | Status: DC

## 2021-05-05 NOTE — Telephone Encounter (Signed)
Brian [name] w/[facility] (Other) [phone]   Pt is still having some burning with urination. He was in office 9/13 with Dr. Ethelene Hal. I did not see this noted. Brian [name] has advised pt continue hydrating, specifically with water. Pt notes he will stop drinking Ginger Ale. Brian [name] said pt notes more an issue with starting urination and flow.   Advised her pt may need appt as noted last week.

## 2021-05-05 NOTE — Telephone Encounter (Signed)
Please advise message below  °

## 2021-05-06 ENCOUNTER — Telehealth: Payer: Self-pay | Admitting: Family Medicine

## 2021-05-06 NOTE — Telephone Encounter (Signed)
Patient aware and will pick up Rx.  

## 2021-05-06 NOTE — Telephone Encounter (Signed)
Returned patients call informed that patient could take Rx AM or PM

## 2021-05-06 NOTE — Telephone Encounter (Signed)
Brian [name] called to ask if Brian [name] is supposed to take the Tamsulosin in AM or PM

## 2021-05-09 ENCOUNTER — Other Ambulatory Visit: Payer: Self-pay | Admitting: Cardiovascular Disease

## 2021-05-10 DIAGNOSIS — I69354 Hemiplegia and hemiparesis following cerebral infarction affecting left non-dominant side: Secondary | ICD-10-CM | POA: Diagnosis not present

## 2021-05-10 DIAGNOSIS — S30810D Abrasion of lower back and pelvis, subsequent encounter: Secondary | ICD-10-CM | POA: Diagnosis not present

## 2021-05-10 DIAGNOSIS — G8220 Paraplegia, unspecified: Secondary | ICD-10-CM | POA: Diagnosis not present

## 2021-05-10 DIAGNOSIS — L03114 Cellulitis of left upper limb: Secondary | ICD-10-CM | POA: Diagnosis not present

## 2021-05-10 DIAGNOSIS — L89022 Pressure ulcer of left elbow, stage 2: Secondary | ICD-10-CM | POA: Diagnosis not present

## 2021-05-10 NOTE — Telephone Encounter (Signed)
Rx(s) sent to pharmacy electronically.  

## 2021-05-11 ENCOUNTER — Telehealth: Payer: Self-pay | Admitting: Family Medicine

## 2021-05-11 NOTE — Telephone Encounter (Signed)
Patient requesting referral to continue PT, please advise

## 2021-05-12 ENCOUNTER — Telehealth: Payer: Self-pay | Admitting: Family Medicine

## 2021-05-12 DIAGNOSIS — M623 Immobility syndrome (paraplegic): Secondary | ICD-10-CM

## 2021-05-12 NOTE — Telephone Encounter (Signed)
Patients daughter Vern Claude [phone] calling to see ig Mr. Friede could have a referral to Neurology for better mobility? Please advise.

## 2021-05-12 NOTE — Telephone Encounter (Signed)
Pt daughter called requesting referral to [facility] for MR [place]. He has been there before but the referral needs to be renewed she said.

## 2021-05-17 DIAGNOSIS — S30810D Abrasion of lower back and pelvis, subsequent encounter: Secondary | ICD-10-CM | POA: Diagnosis not present

## 2021-05-17 DIAGNOSIS — G8220 Paraplegia, unspecified: Secondary | ICD-10-CM | POA: Diagnosis not present

## 2021-05-17 DIAGNOSIS — L03114 Cellulitis of left upper limb: Secondary | ICD-10-CM | POA: Diagnosis not present

## 2021-05-17 DIAGNOSIS — L89022 Pressure ulcer of left elbow, stage 2: Secondary | ICD-10-CM | POA: Diagnosis not present

## 2021-05-17 DIAGNOSIS — I69354 Hemiplegia and hemiparesis following cerebral infarction affecting left non-dominant side: Secondary | ICD-10-CM | POA: Diagnosis not present

## 2021-05-18 DIAGNOSIS — N400 Enlarged prostate without lower urinary tract symptoms: Secondary | ICD-10-CM | POA: Diagnosis not present

## 2021-05-18 DIAGNOSIS — G8220 Paraplegia, unspecified: Secondary | ICD-10-CM | POA: Diagnosis not present

## 2021-05-18 DIAGNOSIS — I10 Essential (primary) hypertension: Secondary | ICD-10-CM | POA: Diagnosis not present

## 2021-05-18 DIAGNOSIS — M1380 Other specified arthritis, unspecified site: Secondary | ICD-10-CM | POA: Diagnosis not present

## 2021-05-18 DIAGNOSIS — E785 Hyperlipidemia, unspecified: Secondary | ICD-10-CM | POA: Diagnosis not present

## 2021-05-18 DIAGNOSIS — I69354 Hemiplegia and hemiparesis following cerebral infarction affecting left non-dominant side: Secondary | ICD-10-CM | POA: Diagnosis not present

## 2021-05-18 DIAGNOSIS — Z7901 Long term (current) use of anticoagulants: Secondary | ICD-10-CM | POA: Diagnosis not present

## 2021-05-18 DIAGNOSIS — F329 Major depressive disorder, single episode, unspecified: Secondary | ICD-10-CM | POA: Diagnosis not present

## 2021-05-18 DIAGNOSIS — K219 Gastro-esophageal reflux disease without esophagitis: Secondary | ICD-10-CM | POA: Diagnosis not present

## 2021-05-18 DIAGNOSIS — F419 Anxiety disorder, unspecified: Secondary | ICD-10-CM | POA: Diagnosis not present

## 2021-05-18 DIAGNOSIS — K5900 Constipation, unspecified: Secondary | ICD-10-CM | POA: Diagnosis not present

## 2021-05-18 NOTE — Telephone Encounter (Signed)
Referral placed.

## 2021-05-18 NOTE — Addendum Note (Signed)
Addended by: Lynda Rainwater on: 05/18/2021 02:58 PM   Modules accepted: Orders

## 2021-05-24 DIAGNOSIS — F329 Major depressive disorder, single episode, unspecified: Secondary | ICD-10-CM | POA: Diagnosis not present

## 2021-05-24 DIAGNOSIS — I10 Essential (primary) hypertension: Secondary | ICD-10-CM | POA: Diagnosis not present

## 2021-05-24 DIAGNOSIS — F419 Anxiety disorder, unspecified: Secondary | ICD-10-CM | POA: Diagnosis not present

## 2021-05-24 DIAGNOSIS — N400 Enlarged prostate without lower urinary tract symptoms: Secondary | ICD-10-CM | POA: Diagnosis not present

## 2021-05-24 DIAGNOSIS — I69354 Hemiplegia and hemiparesis following cerebral infarction affecting left non-dominant side: Secondary | ICD-10-CM | POA: Diagnosis not present

## 2021-05-24 DIAGNOSIS — G8220 Paraplegia, unspecified: Secondary | ICD-10-CM | POA: Diagnosis not present

## 2021-06-03 DIAGNOSIS — I10 Essential (primary) hypertension: Secondary | ICD-10-CM | POA: Diagnosis not present

## 2021-06-03 DIAGNOSIS — G8220 Paraplegia, unspecified: Secondary | ICD-10-CM | POA: Diagnosis not present

## 2021-06-03 DIAGNOSIS — I69354 Hemiplegia and hemiparesis following cerebral infarction affecting left non-dominant side: Secondary | ICD-10-CM | POA: Diagnosis not present

## 2021-06-03 DIAGNOSIS — F419 Anxiety disorder, unspecified: Secondary | ICD-10-CM | POA: Diagnosis not present

## 2021-06-03 DIAGNOSIS — F329 Major depressive disorder, single episode, unspecified: Secondary | ICD-10-CM | POA: Diagnosis not present

## 2021-06-03 DIAGNOSIS — N400 Enlarged prostate without lower urinary tract symptoms: Secondary | ICD-10-CM | POA: Diagnosis not present

## 2021-06-08 ENCOUNTER — Telehealth: Payer: Self-pay

## 2021-06-08 NOTE — Progress Notes (Signed)
Chronic Care Management Pharmacy Assistant   Name: Brian [name]  MRN: [id number] DOB: 07/25/1933  Reason for Encounter:Hypertension Disease State Call.   Recent office visits:  05/12/2021 Dr.Kremer MD (PCP) place a refer to neurology.  05/05/2021 Dr.Kremer MD (PCP) start Tamsulosin 0.4 mg daily  05/03/2021 Dr.Kremer MD (PCP) No Medication Changes noted, Return in about 3 months   Recent consult visits:  No recent [facility] visits:  None in previous 6 months  Medications: Outpatient Encounter Medications as of 06/08/2021  Medication Sig   acetaminophen (TYLENOL) 500 MG tablet Take 500 mg by mouth every 6 (six) hours as needed.   cetirizine (ZYRTEC) 10 MG tablet TAKE 1 TABLET(10 MG) BY MOUTH AT BEDTIME   CMC-Calcium Alginate-Silver (TEGADERM ALGINATE AG DRESSING) 4" X 5" PADS Apply to sore on back side of left elbow once weekly.   ELIQUIS 2.5 MG TABS tablet TAKE 1 TABLET(2.5 MG) BY MOUTH TWICE DAILY   finasteride (PROSCAR) 5 MG tablet TAKE 1 TABLET(5 MG) BY MOUTH DAILY   hydrocortisone (ANUCORT-HC) 25 MG suppository UNWRAP AND INSERT 1 SUPPOSITORY RECTALLY TWICE DAILY   hydrocortisone (PROCTOZONE-HC) 2.5 % rectal cream USE RECTALLY TWICE DAILY   hydrocortisone-pramoxine (ANALPRAM-HC) 2.5-1 % rectal cream Place 1 application rectally 2 (two) times daily as needed for hemorrhoids.    losartan (COZAAR) 25 MG tablet TAKE 1 TABLET(25 MG) BY MOUTH DAILY   metoprolol succinate (TOPROL-XL) 25 MG 24 hr tablet TAKE 1 TABLET(25 MG) BY MOUTH DAILY   Multiple Vitamin (MULTIVITAMIN) tablet Take 2 tablets by mouth daily.   oxyCODONE-acetaminophen (PERCOCET/ROXICET) 5-325 MG tablet Take 1 tablet by mouth 2 (two) times daily as needed for severe pain.   pantoprazole (PROTONIX) 40 MG tablet TAKE 1 TABLET(40 MG) BY MOUTH DAILY   polyethylene glycol powder (GLYCOLAX/MIRALAX) 17 GM/SCOOP powder Mix one scoop with water and take daily until stools loosen. May repeat as needed.    pravastatin (PRAVACHOL) 40 MG tablet TAKE 1 TABLET(40 MG) BY MOUTH DAILY   QUEtiapine (SEROQUEL) 50 MG tablet TAKE 1 TABLET(50 MG) BY MOUTH AT BEDTIME   sertraline (ZOLOFT) 50 MG tablet TAKE 1 TABLET(50 MG) BY MOUTH EVERY MORNING   tamsulosin (FLOMAX) 0.4 MG CAPS capsule Take 1 capsule (0.4 mg total) by mouth daily.   No facility-administered encounter medications on file as of 06/08/2021.    Care Gaps: Shingrix Vaccine  Star Rating Drugs: Losartan 25 mg last filled 05/10/2021 90 day supply at [facility]. Pravastatin 40 mg  last filled 05/10/2021 90 day supply at [facility]. Medication Fill Gaps: None  Reviewed chart prior to disease state call. Spoke with patient regarding BP  Recent Office Vitals: BP Readings from Last 3 Encounters:  05/03/21 120/62  03/29/21 134/70  03/23/21 118/70   Pulse Readings from Last 3 Encounters:  05/03/21 68  03/29/21 70  03/23/21 85    Wt Readings from Last 3 Encounters:  05/03/21 180 lb (81.6 kg)  03/29/21 173 lb (78.5 kg)  02/08/21 177 lb (80.3 kg)     Kidney Function Lab Results  Component Value Date/Time   CREATININE 0.56 05/03/2021 02:02 PM   CREATININE 0.47 12/07/2020 03:11 PM   CREATININE 0.63 (L) 02/06/2020 05:06 PM   CREATININE 0.64 (L) 10/03/2016 11:57 AM   GFR 89.37 05/03/2021 02:02 PM   GFRNONAA >60 11/03/2018 07:35 PM   GFRNONAA >60 01/23/2011 09:20 AM   GFRAA >60 11/03/2018 07:35 PM   GFRAA >60 01/23/2011 09:20 AM    BMP Latest  Ref Rng & Units 05/03/2021 12/07/2020 05/11/2020  Glucose 70 - 99 mg/dL 98 84 96  BUN 6 - 23 mg/dL 13 15 16   Creatinine 0.40 - 1.50 mg/dL 0.56 0.47 0.68  BUN/Creat Ratio 6 - 22 (calc) - - -  Sodium 135 - 145 mEq/L 136 134(L) 135  Potassium 3.5 - 5.1 mEq/L 4.1 4.4 4.6  Chloride 96 - 112 mEq/L 102 98 101  CO2 19 - 32 mEq/L 28 29 29   Calcium 8.4 - 10.5 mg/dL 8.8 9.0 9.2    Current antihypertensive regimen:  Losartan 25 mg daily  Metoprolol XL 25 mg daily   What recent  interventions/DTPs have been made by any provider to improve Blood Pressure control since last CPP Visit: None ID Any recent hospitalizations or ED visits since last visit with CPP? No  I have attempted without success to contact this patient by phone three times to do his hypertension Disease State call. I left a Voice message for patient to return my call.  Adherence Review: Is the patient currently on ACE/ARB medication? Yes Does the patient have >5 day gap between last estimated fill dates? No  Telephone follow up appointment with Care management team member scheduled for : 09/06/2021 at 1:00 pm.  Bessie Ernest Pharmacist Assistant [phone]

## 2021-06-09 DIAGNOSIS — I69354 Hemiplegia and hemiparesis following cerebral infarction affecting left non-dominant side: Secondary | ICD-10-CM | POA: Diagnosis not present

## 2021-06-09 DIAGNOSIS — F329 Major depressive disorder, single episode, unspecified: Secondary | ICD-10-CM | POA: Diagnosis not present

## 2021-06-09 DIAGNOSIS — N400 Enlarged prostate without lower urinary tract symptoms: Secondary | ICD-10-CM | POA: Diagnosis not present

## 2021-06-09 DIAGNOSIS — I10 Essential (primary) hypertension: Secondary | ICD-10-CM | POA: Diagnosis not present

## 2021-06-09 DIAGNOSIS — F419 Anxiety disorder, unspecified: Secondary | ICD-10-CM | POA: Diagnosis not present

## 2021-06-09 DIAGNOSIS — G8220 Paraplegia, unspecified: Secondary | ICD-10-CM | POA: Diagnosis not present

## 2021-06-16 DIAGNOSIS — N400 Enlarged prostate without lower urinary tract symptoms: Secondary | ICD-10-CM | POA: Diagnosis not present

## 2021-06-16 DIAGNOSIS — I10 Essential (primary) hypertension: Secondary | ICD-10-CM | POA: Diagnosis not present

## 2021-06-16 DIAGNOSIS — I69354 Hemiplegia and hemiparesis following cerebral infarction affecting left non-dominant side: Secondary | ICD-10-CM | POA: Diagnosis not present

## 2021-06-16 DIAGNOSIS — F329 Major depressive disorder, single episode, unspecified: Secondary | ICD-10-CM | POA: Diagnosis not present

## 2021-06-16 DIAGNOSIS — G8220 Paraplegia, unspecified: Secondary | ICD-10-CM | POA: Diagnosis not present

## 2021-06-16 DIAGNOSIS — F419 Anxiety disorder, unspecified: Secondary | ICD-10-CM | POA: Diagnosis not present

## 2021-06-20 DIAGNOSIS — I10 Essential (primary) hypertension: Secondary | ICD-10-CM | POA: Diagnosis not present

## 2021-06-20 DIAGNOSIS — I6930 Unspecified sequelae of cerebral infarction: Secondary | ICD-10-CM | POA: Diagnosis not present

## 2021-06-20 DIAGNOSIS — N400 Enlarged prostate without lower urinary tract symptoms: Secondary | ICD-10-CM | POA: Diagnosis not present

## 2021-06-20 DIAGNOSIS — K219 Gastro-esophageal reflux disease without esophagitis: Secondary | ICD-10-CM | POA: Diagnosis not present

## 2021-06-21 ENCOUNTER — Other Ambulatory Visit: Payer: Self-pay

## 2021-06-21 ENCOUNTER — Encounter (HOSPITAL_BASED_OUTPATIENT_CLINIC_OR_DEPARTMENT_OTHER): Payer: Self-pay | Admitting: Cardiovascular Disease

## 2021-06-21 ENCOUNTER — Ambulatory Visit (INDEPENDENT_AMBULATORY_CARE_PROVIDER_SITE_OTHER): Payer: Medicare Other | Admitting: Cardiovascular Disease

## 2021-06-21 ENCOUNTER — Telehealth: Payer: Self-pay | Admitting: Family Medicine

## 2021-06-21 DIAGNOSIS — I10 Essential (primary) hypertension: Secondary | ICD-10-CM | POA: Diagnosis not present

## 2021-06-21 DIAGNOSIS — I2510 Atherosclerotic heart disease of native coronary artery without angina pectoris: Secondary | ICD-10-CM

## 2021-06-21 DIAGNOSIS — E781 Pure hyperglyceridemia: Secondary | ICD-10-CM | POA: Diagnosis not present

## 2021-06-21 DIAGNOSIS — I4819 Other persistent atrial fibrillation: Secondary | ICD-10-CM

## 2021-06-21 NOTE — Patient Instructions (Signed)
Medication Instructions:  Your physician recommends that you continue on your current medications as directed. Please refer to the Current Medication list given to you today.   *If you need a refill on your cardiac medications before your next appointment, please call your pharmacy*   Lab Work: none If you have labs (blood work) drawn today and your tests are completely normal, you will receive your results only by: [facility] (if you have MyChart) OR A paper copy in the mail If you have any lab test that is abnormal or we need to change your treatment, we will call you to review the results.   Testing/Procedures: none   Follow-Up: At [facility], you and your health needs are our priority.  As part of our continuing mission to provide you with exceptional heart care, we have created designated Provider Care Teams.  These Care Teams include your primary Cardiologist (physician) and Advanced Practice Providers (APPs -  Physician Assistants and Nurse Practitioners) who all work together to provide you with the care you need, when you need it.  We recommend signing up for the patient portal called "MyChart".  Sign up information is provided on this After Visit Summary.  MyChart is used to connect with patients for Virtual Visits (Telemedicine).  Patients are able to view lab/test results, encounter notes, upcoming appointments, etc.  Non-urgent messages can be sent to your provider as well.   To learn more about what you can do with MyChart, go to [email or web address].    Your next appointment:   6 month(s)  The format for your next appointment:   In Person  Provider:   Skeet Latch, MD

## 2021-06-21 NOTE — Progress Notes (Signed)
Cardiology Office Note   Date:  06/21/2021   ID:  Brian, [name] 1932/12/10, MRN [id number]  PCP:  Libby Maw, MD  Cardiologist:   Skeet Latch, MD   No chief complaint on file.    History of Present Illness: Brian STAGE is a 87 y.o. male with paroxysmal atrial fibrillation, GI bleeds, prior stroke, hypertension, prior mitral vale repair who presents for follow up.  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  He underwent embolectomy. INR was 2.8 on admission.  He was switched to Eliquis 2.5 mg bid prior to discharge.  Brian [name] had a gastric ulcer in 2002 and required transfusion at that time. This was subsequently treated and he has not had any recurrent GI bleeding.  Brian [name] had his mitral valve repaired at [facility] in 2002. [place] He had an echo 05/06/16 that revealed LVEF 55-60% and mildly elevated pulmonary pressures.  He had a [employer] 01/2004 that revealed LVEF 64% and a nonreversible inferior defect that was thought to be diaphragm attenuation versus infarct. He had an echo 10/2009 with normal systolic function, mild aortic regurgitation and mild mitral regurgitation.  He was last seen by Dr. Stanford Breed 01/2013 at which time he was felt to be in atrial flutter. He was well rate-controlled and was asymptomatic.   At his last appointment he was feeling well and working on losing weight. He was in atrial fibrillation but asymptomatic and rates were controlled. Today, patient comes in with 2 other family members. Overall he is not feeling good. At home, his blood pressure is averaging in the 120s/70s. Since his last visit he has gained 30lbs, and is having difficulty trying to remove weight.  His daughter thought he was too frail and encouraged him to eat junk food.  Now he is having a hard time losing the weight.  For activity, he is trying to lift weights for  about 20 min 4 days a week. 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 For his diet, his meals are cooked for him and he eats salmon often. He is known to fall when he stands, but he denies any recent falls. Lately he develops constipation sometimes. He treats this with metamucil tablets, but will usually have subsequent diarrhea. Also he is suffering from left knee pain. During the physical exam, he noted sensitivity with palpation of his right LE. He denies any chest pain, or shortness of breath. No lightheadedness, headaches, syncope, orthopnea, PND.     Past Medical History:  Diagnosis Date   Anemia    Atrial flutter (HCC)    BPH (benign prostatic hypertrophy)    Epistaxis    Hemorrhage of gastrointestinal tract, unspecified    History of open heart surgery    Hypertension    Obesity (BMI 30.0-34.9) 06/29/2020   Osteoarthrosis, unspecified whether generalized or localized, unspecified site    Personal history of venous thrombosis and embolism    Rosacea    Stroke ([place])    TIA (transient ischemic attack)     Past Surgical History:  Procedure Laterality Date   APPENDECTOMY     HERNIA REPAIR     IR GENERIC HISTORICAL  05/04/2016   IR PERCUTANEOUS ART THROMBECTOMY/INFUSION INTRACRANIAL INC DIAG ANGIO 05/04/2016 [name], MD [facility]   IR GENERIC HISTORICAL  05/04/2016   IR ANGIO VERTEBRAL SEL SUBCLAVIAN INNOMINATE UNI R MOD  SED 05/04/2016 [name], MD [facility]   IR GENERIC HISTORICAL  06/07/2016   IR RADIOLOGIST EVAL & MGMT 06/07/2016 [facility]   JOINT REPLACEMENT     MITRAL VALVE REPAIR     mvp repair     RADIOLOGY WITH ANESTHESIA N/A 05/04/2016   Procedure: RADIOLOGY WITH ANESTHESIA;  Surgeon: [name], MD;  Location: [place];  Service: Radiology;  Laterality: N/A;   TOTAL HIP ARTHROPLASTY       Current Outpatient Medications  Medication Sig Dispense Refill   acetaminophen (TYLENOL) 500 MG tablet Take 500 mg by mouth every 6 (six) hours as  needed.     cetirizine (ZYRTEC) 10 MG tablet TAKE 1 TABLET(10 MG) BY MOUTH AT BEDTIME 90 tablet 2   CMC-Calcium Alginate-Silver (TEGADERM ALGINATE AG DRESSING) 4" X 5" PADS Apply to sore on back side of left elbow once weekly. 16 each 1   ELIQUIS 2.5 MG TABS tablet TAKE 1 TABLET(2.5 MG) BY MOUTH TWICE DAILY 180 tablet 1   finasteride (PROSCAR) 5 MG tablet TAKE 1 TABLET(5 MG) BY MOUTH DAILY 90 tablet 0   hydrocortisone (ANUCORT-HC) 25 MG suppository UNWRAP AND INSERT 1 SUPPOSITORY RECTALLY TWICE DAILY 30 suppository 10   hydrocortisone (PROCTOZONE-HC) 2.5 % rectal cream USE RECTALLY TWICE DAILY 30 g 0   hydrocortisone-pramoxine (ANALPRAM-HC) 2.5-1 % rectal cream Place 1 application rectally 2 (two) times daily as needed for hemorrhoids.      losartan (COZAAR) 25 MG tablet TAKE 1 TABLET(25 MG) BY MOUTH DAILY 90 tablet 0   metoprolol succinate (TOPROL-XL) 25 MG 24 hr tablet TAKE 1 TABLET(25 MG) BY MOUTH DAILY 90 tablet 3   Multiple Vitamin (MULTIVITAMIN) tablet Take 2 tablets by mouth daily.     oxyCODONE-acetaminophen (PERCOCET/ROXICET) 5-325 MG tablet Take 1 tablet by mouth 2 (two) times daily as needed for severe pain. 180 tablet 0   pantoprazole (PROTONIX) 40 MG tablet TAKE 1 TABLET(40 MG) BY MOUTH DAILY 90 tablet 0   polyethylene glycol powder (GLYCOLAX/MIRALAX) 17 GM/SCOOP powder Mix one scoop with water and take daily until stools loosen. May repeat as needed. 850 g 1   pravastatin (PRAVACHOL) 40 MG tablet TAKE 1 TABLET(40 MG) BY MOUTH DAILY 90 tablet 3   QUEtiapine (SEROQUEL) 50 MG tablet TAKE 1 TABLET(50 MG) BY MOUTH AT BEDTIME 30 tablet 2   sertraline (ZOLOFT) 50 MG tablet TAKE 1 TABLET(50 MG) BY MOUTH EVERY MORNING 90 tablet 3   tamsulosin (FLOMAX) 0.4 MG CAPS capsule Take 1 capsule (0.4 mg total) by mouth daily. 30 capsule 3   No current facility-administered medications for this visit.    Allergies:   Novocain [procaine hcl], Other, and Penicillins    Social History:  The patient   reports that he has quit smoking. He has never used smokeless tobacco. He reports that he does not drink alcohol and does not use drugs.   Family History:  The patient's family history includes Coronary artery disease in his father; Rheumatic fever in his mother.    ROS:   Please see the history of present illness.  (+) Weight gain (+) Constipation (+) Diarrhea (+) Left knee pain (+) Right LE tenderness  All other systems are reviewed and negative.     PHYSICAL EXAM: VS:  BP 118/62 (BP Location: Right Arm, Patient Position: Sitting, Cuff Size: Normal)   Pulse (!) 58   Ht 5\' 6"  (1.676 m)   Wt 178 lb 12.8 oz (81.1 kg)   BMI 28.86 kg/m  ,  BMI Body mass index is 28.86 kg/m. GENERAL:  Frail, elderly man in wheelchair.  No acute distress HEENT:  Pupils equal round and reactive, fundi not visualized, oral mucosa unremarkable NECK:  No jugular venous distention, waveform within normal limits, carotid upstroke brisk and symmetric, no bruits LUNGS:  Clear to auscultation bilaterally.  No crackles, rhonchi, or wheezes. HEART:  Irregularly irregular.  PMI not displaced or sustained,S1 and S2 within normal limits, no S3, no S4, no clicks, no rubs, III/VI holosystolic murmur at the apex ABD:  Flat, positive bowel sounds normal in frequency in pitch, no bruits, no rebound, no guarding, no midline pulsatile mass, no hepatomegaly, no splenomegaly EXT:  2 plus pulses throughout, no edema, no cyanosis no clubbing SKIN:  No rashes no nodules NEURO:  Cranial nerves II through XII grossly intact, motor grossly intact throughout PSYCH:  Cognitively intact, oriented to person place and time  EKG:   06/21/21: Atrial Fibrillation. Rate 58 bpm. LAFB. LVH 06/29/20: Atrial fibrillation.  Rate 56 bpm.  LAFB.  LVH. 04/23/18: Sinus rhythm.  Rate 86 bpm. LVH with repolarization abnormalities. 07/04/16:  Atrial fibrillation. Rate 76 bpm. Left anterior fascicular block. Left ventricular hypertrophy with  repolarization abnormalities.     Carotid Duplex 02/05/2018: Final Interpretation:  Right Carotid: Velocities in the right ICA are consistent with a 1-39%  stenosis.   Left Carotid: Velocities in the left ICA are consistent with a 1-39%  stenosis.   Vertebrals: Bilateral vertebral arteries demonstrate antegrade flow.   Transcranial Doppler 02/05/2018: Final Interpretation:      Low mean flow velocities in right middle cerebral artery suggests distal  stenosis. Normal mean flow velocities in remaining identified vessels of  anterior and posterior circulation. Globally elevated pulsatility indexes  indicates diffuse intracranial  atherosclerosis likely.    Echo 05/06/16: Study Conclusions   - Left ventricle: The cavity size was normal. Wall thickness was   normal. Systolic function was normal. The estimated ejection   fraction was in the range of 55% to 60%. Wall motion was normal;   there were no regional wall motion abnormalities. Mitral   annuloplasty precludes evaluation of LV diastolic function. - Ventricular septum: Septal motion showed paradox. These changes   are consistent with a post-thoracotomy state. - Mitral valve: Prior procedures included surgical repair. An   annular ring prosthesis was present and functioning normally.   Valve area by pressure half-time: 2.47 cm^2. - Left atrium: The atrium was mildly dilated. - Pulmonary arteries: Systolic pressure was mildly increased. PA   peak pressure: 44 mm Hg (S).  Recent Labs: 12/07/2020: TSH 1.55 05/03/2021: ALT 12; BUN 13; Creatinine, Ser 0.56; Hemoglobin 13.9; Platelets 212.0; Potassium 4.1; Sodium 136    Lipid Panel    Component Value Date/Time   CHOL 123 12/07/2020 1511   CHOL 134 04/16/2018 1155   TRIG 83.0 12/07/2020 1511   HDL 51.10 12/07/2020 1511   HDL 51 04/16/2018 1155   CHOLHDL 2 12/07/2020 1511   VLDL 16.6 12/07/2020 1511   LDLCALC 56 12/07/2020 1511   LDLCALC 55 04/16/2018 1155   LDLDIRECT  68 02/06/2020 1706      Wt Readings from Last 3 Encounters:  06/21/21 178 lb 12.8 oz (81.1 kg)  05/03/21 180 lb (81.6 kg)  03/29/21 173 lb (78.5 kg)      ASSESSMENT AND PLAN:  Essential hypertension Blood pressure is well-controlled.  Continue losartan and metoprolol.   Hyperlipidemia Lipids are well-controlled.  Continue pravastatin.   Coronary artery disease involving  native coronary artery of native heart without angina pectoris Stable.  He has no angina.  Continue metoprolol and statin.   Persistent atrial fibrillation ([place]) He remains in atrial fibrillation.  Rates are well have been controlled and he is asymptomatic.  Continue Eliquis and metoprolol.   s are reviewed at length with the patient today.  The patient does not have concerns regarding medicines.  The following changes have been made:  no change  Labs/ tests ordered today include:   Orders Placed This Encounter  Procedures   EKG 12-Lead     Disposition:   FU with Karren Newland C. Oval Linsey, MD, [facility] in 6 months  I,Mathew Stumpf,acting as a Education administrator for Skeet Latch, MD.,have documented all relevant documentation on the behalf of Skeet Latch, MD,as directed by  Skeet Latch, MD while in the presence of Skeet Latch, MD.   I, [place] Oval Linsey, MD have reviewed all documentation for this visit.  The documentation of the exam, diagnosis, procedures, and orders on 06/21/2021 are all accurate and complete.   Signed, Arleigh Odowd C. Oval Linsey, MD, [facility]  06/21/2021 2:42 PM    [facility]

## 2021-06-21 NOTE — Assessment & Plan Note (Signed)
Lipids are well-controlled.  Continue pravastatin.

## 2021-06-21 NOTE — Assessment & Plan Note (Signed)
He remains in atrial fibrillation.  Rates are well have been controlled and he is asymptomatic.  Continue Eliquis and metoprolol.

## 2021-06-21 NOTE — Assessment & Plan Note (Signed)
Stable.  He has no angina.  Continue metoprolol and statin.

## 2021-06-21 NOTE — Assessment & Plan Note (Signed)
Blood pressure is well-controlled.  Continue losartan and metoprolol. 

## 2021-06-22 ENCOUNTER — Telehealth: Payer: Self-pay

## 2021-06-22 NOTE — Telephone Encounter (Signed)
Patient referred to neurology, notes state that patient needs to follow up with [facility] would this be [facility]? Please advise

## 2021-06-23 ENCOUNTER — Telehealth: Payer: Self-pay

## 2021-06-23 NOTE — Telephone Encounter (Signed)
Patients daughter calling to see if an order for PT at the home could be put in for patient to start back? Please advise.

## 2021-06-23 NOTE — Telephone Encounter (Signed)
Called patients daughter who states that she is unaware of an appointment to neurologist and also refills on medications that her brother man have called on this. Called and spoke with patient who verbally understood Rx for Zyrtec has refills on it at the pharmacy. Patients daughter would like an order for patient to start back with PT.

## 2021-06-23 NOTE — Telephone Encounter (Signed)
Pts son called requesting cetirizine (ZYRTEC) 10 MG tablet [[id number]]....  Needs asap!!!

## 2021-06-24 DIAGNOSIS — Z7901 Long term (current) use of anticoagulants: Secondary | ICD-10-CM | POA: Diagnosis not present

## 2021-06-24 DIAGNOSIS — L89159 Pressure ulcer of sacral region, unspecified stage: Secondary | ICD-10-CM | POA: Diagnosis not present

## 2021-06-24 DIAGNOSIS — G894 Chronic pain syndrome: Secondary | ICD-10-CM | POA: Diagnosis not present

## 2021-06-24 DIAGNOSIS — B372 Candidiasis of skin and nail: Secondary | ICD-10-CM | POA: Diagnosis not present

## 2021-06-24 DIAGNOSIS — I4891 Unspecified atrial fibrillation: Secondary | ICD-10-CM | POA: Diagnosis not present

## 2021-06-24 DIAGNOSIS — I10 Essential (primary) hypertension: Secondary | ICD-10-CM | POA: Diagnosis not present

## 2021-06-24 NOTE — Telephone Encounter (Signed)
Order placed for PT called patients daughter to inform no answer LM on VM.

## 2021-06-24 NOTE — Addendum Note (Signed)
Addended by: Lynda Rainwater on: 06/24/2021 08:43 AM   Modules accepted: Orders

## 2021-06-30 ENCOUNTER — Other Ambulatory Visit: Payer: Self-pay | Admitting: Family Medicine

## 2021-06-30 ENCOUNTER — Telehealth: Payer: Self-pay | Admitting: Family Medicine

## 2021-06-30 DIAGNOSIS — I10 Essential (primary) hypertension: Secondary | ICD-10-CM

## 2021-06-30 DIAGNOSIS — I480 Paroxysmal atrial fibrillation: Secondary | ICD-10-CM

## 2021-06-30 NOTE — Telephone Encounter (Signed)
Pt called asking if he needs the newest covid booster

## 2021-07-01 ENCOUNTER — Other Ambulatory Visit: Payer: Self-pay | Admitting: Family Medicine

## 2021-07-01 DIAGNOSIS — M5416 Radiculopathy, lumbar region: Secondary | ICD-10-CM

## 2021-07-01 DIAGNOSIS — M169 Osteoarthritis of hip, unspecified: Secondary | ICD-10-CM

## 2021-07-01 DIAGNOSIS — M19012 Primary osteoarthritis, left shoulder: Secondary | ICD-10-CM

## 2021-07-01 DIAGNOSIS — M623 Immobility syndrome (paraplegic): Secondary | ICD-10-CM

## 2021-07-01 NOTE — Telephone Encounter (Signed)
Patient aware that he should have booster to check with pharmacy to see if they can give it.

## 2021-07-04 ENCOUNTER — Other Ambulatory Visit: Payer: Self-pay

## 2021-07-04 ENCOUNTER — Encounter: Payer: Self-pay | Admitting: Family Medicine

## 2021-07-04 DIAGNOSIS — I6300 Cerebral infarction due to thrombosis of unspecified precerebral artery: Secondary | ICD-10-CM

## 2021-07-04 DIAGNOSIS — M5416 Radiculopathy, lumbar region: Secondary | ICD-10-CM

## 2021-07-04 DIAGNOSIS — I480 Paroxysmal atrial fibrillation: Secondary | ICD-10-CM

## 2021-07-04 DIAGNOSIS — M19012 Primary osteoarthritis, left shoulder: Secondary | ICD-10-CM

## 2021-07-04 DIAGNOSIS — M623 Immobility syndrome (paraplegic): Secondary | ICD-10-CM

## 2021-07-04 DIAGNOSIS — M169 Osteoarthritis of hip, unspecified: Secondary | ICD-10-CM

## 2021-07-04 DIAGNOSIS — G4709 Other insomnia: Secondary | ICD-10-CM

## 2021-07-04 MED ORDER — OXYCODONE-ACETAMINOPHEN 5-325 MG PO TABS: 1.0000 | ORAL_TABLET | Freq: Two times a day (BID) | ORAL | 0 refills | Status: DC | PRN

## 2021-07-04 MED ORDER — QUETIAPINE FUMARATE 50 MG PO TABS: ORAL_TABLET | ORAL | 2 refills | Status: DC

## 2021-07-04 MED ORDER — APIXABAN 2.5 MG PO TABS: ORAL_TABLET | ORAL | 1 refills | Status: DC

## 2021-07-04 NOTE — Telephone Encounter (Signed)
Patient's Son, Brian [name] called for refills on the following: Oxycodone 5/325mg  Eliquis 2.5 mg Quetiapine 50 mg  Please review and advise.  Thanks.  Dm/cma

## 2021-07-04 NOTE — Telephone Encounter (Signed)
Pts son called requesting a rush order on the Oxycodone

## 2021-07-06 ENCOUNTER — Telehealth: Payer: Self-pay

## 2021-07-06 NOTE — Progress Notes (Signed)
Chronic Care Management Pharmacy Assistant   Name: Brian [name]  MRN: [id number] DOB: 19-Aug-1933  Reason for Encounter:Hypertension Disease State Call.   Recent office visits:  No recent office visit  Recent consult visits:  06/21/2021 Dr. Oval Linsey MD (Cardiology) No Medication Changes noted, Follow up in 6 months  Hospital visits:  None in previous 6 months  Medications: Outpatient Encounter Medications as of 07/06/2021  Medication Sig   acetaminophen (TYLENOL) 500 MG tablet Take 500 mg by mouth every 6 (six) hours as needed.   apixaban (ELIQUIS) 2.5 MG TABS tablet TAKE 1 TABLET(2.5 MG) BY MOUTH TWICE DAILY   cetirizine (ZYRTEC) 10 MG tablet TAKE 1 TABLET(10 MG) BY MOUTH AT BEDTIME   CMC-Calcium Alginate-Silver (TEGADERM ALGINATE AG DRESSING) 4" X 5" PADS Apply to sore on back side of left elbow once weekly.   finasteride (PROSCAR) 5 MG tablet TAKE 1 TABLET(5 MG) BY MOUTH DAILY   hydrocortisone (ANUCORT-HC) 25 MG suppository UNWRAP AND INSERT 1 SUPPOSITORY RECTALLY TWICE DAILY   hydrocortisone (PROCTOZONE-HC) 2.5 % rectal cream USE RECTALLY TWICE DAILY   hydrocortisone-pramoxine (ANALPRAM-HC) 2.5-1 % rectal cream Place 1 application rectally 2 (two) times daily as needed for hemorrhoids.    losartan (COZAAR) 25 MG tablet TAKE 1 TABLET(25 MG) BY MOUTH DAILY   metoprolol succinate (TOPROL-XL) 25 MG 24 hr tablet TAKE 1 TABLET(25 MG) BY MOUTH DAILY   Multiple Vitamin (MULTIVITAMIN) tablet Take 2 tablets by mouth daily.   oxyCODONE-acetaminophen (PERCOCET/ROXICET) 5-325 MG tablet Take 1 tablet by mouth 2 (two) times daily as needed for severe pain.   pantoprazole (PROTONIX) 40 MG tablet TAKE 1 TABLET(40 MG) BY MOUTH DAILY   polyethylene glycol powder (GLYCOLAX/MIRALAX) 17 GM/SCOOP powder Mix one scoop with water and take daily until stools loosen. May repeat as needed.   pravastatin (PRAVACHOL) 40 MG tablet TAKE 1 TABLET(40 MG) BY MOUTH DAILY   QUEtiapine (SEROQUEL) 50 MG tablet  TAKE 1 TABLET(50 MG) BY MOUTH AT BEDTIME   sertraline (ZOLOFT) 50 MG tablet TAKE 1 TABLET(50 MG) BY MOUTH EVERY MORNING   tamsulosin (FLOMAX) 0.4 MG CAPS capsule Take 1 capsule (0.4 mg total) by mouth daily.   No facility-administered encounter medications on file as of 07/06/2021.   Care Gaps: Shingrix Vaccine  Star Rating Drugs: Losartan 25 mg last filled 05/10/2021 90 day supply at [facility]. Pravastatin 40 mg  last filled 05/10/2021 90 day supply at [facility]. Medication Fill Gaps: None  Reviewed chart prior to disease state call. Spoke with patient regarding BP  Recent Office Vitals: BP Readings from Last 3 Encounters:  06/21/21 118/62  05/03/21 120/62  03/29/21 134/70   Pulse Readings from Last 3 Encounters:  06/21/21 (!) 58  05/03/21 68  03/29/21 70    Wt Readings from Last 3 Encounters:  06/21/21 178 lb 12.8 oz (81.1 kg)  05/03/21 180 lb (81.6 kg)  03/29/21 173 lb (78.5 kg)     Kidney Function Lab Results  Component Value Date/Time   CREATININE 0.56 05/03/2021 02:02 PM   CREATININE 0.47 12/07/2020 03:11 PM   CREATININE 0.63 (L) 02/06/2020 05:06 PM   CREATININE 0.64 (L) 10/03/2016 11:57 AM   GFR 89.37 05/03/2021 02:02 PM   GFRNONAA >60 11/03/2018 07:35 PM   GFRNONAA >60 01/23/2011 09:20 AM   GFRAA >60 11/03/2018 07:35 PM   GFRAA >60 01/23/2011 09:20 AM    BMP Latest Ref Rng & Units 05/03/2021 12/07/2020 05/11/2020  Glucose 70 - 99 mg/dL 98 84 96  BUN 6 - 23 mg/dL 13 15 16   Creatinine 0.40 - 1.50 mg/dL 0.56 0.47 0.68  BUN/Creat Ratio 6 - 22 (calc) - - -  Sodium 135 - 145 mEq/L 136 134(L) 135  Potassium 3.5 - 5.1 mEq/L 4.1 4.4 4.6  Chloride 96 - 112 mEq/L 102 98 101  CO2 19 - 32 mEq/L 28 29 29   Calcium 8.4 - 10.5 mg/dL 8.8 9.0 9.2    Current antihypertensive regimen:  Losartan 25 mg daily  Metoprolol XL 25 mg daily     Patient denies headaches,dizziness or lightheadedness. How often are you checking your Blood Pressure? 1-2x per  week Current home BP readings:  Patient states his blood pressure has been ranging around 120/70's. What recent interventions/DTPs have been made by any provider to improve Blood Pressure control since last CPP Visit: None ID Any recent hospitalizations or ED visits since last visit with CPP? No What diet changes have been made to improve Blood Pressure Control?  Patient denies any changes with his diet.  What exercise is being done to improve your Blood Pressure Control?  Patient states he does chair exercises and uses a dumbbell weight.  Adherence Review: Is the patient currently on ACE/ARB medication? Yes Does the patient have >5 day gap between last estimated fill dates? No  Telephone follow up appointment with Care management team member scheduled for : 09/06/2021 at 1:00 pm  [place] Pharmacist Assistant [phone]

## 2021-07-28 NOTE — Telephone Encounter (Signed)
Patient seen 06/2021

## 2021-08-02 ENCOUNTER — Ambulatory Visit (INDEPENDENT_AMBULATORY_CARE_PROVIDER_SITE_OTHER): Payer: Medicare Other | Admitting: Family Medicine

## 2021-08-02 ENCOUNTER — Other Ambulatory Visit: Payer: Self-pay

## 2021-08-02 ENCOUNTER — Encounter: Payer: Self-pay | Admitting: Family Medicine

## 2021-08-02 VITALS — BP 115/66 | HR 61 | Temp 97.2°F | Ht 66.0 in | Wt 178.0 lb

## 2021-08-02 DIAGNOSIS — I10 Essential (primary) hypertension: Secondary | ICD-10-CM

## 2021-08-02 DIAGNOSIS — R5381 Other malaise: Secondary | ICD-10-CM

## 2021-08-02 DIAGNOSIS — I6300 Cerebral infarction due to thrombosis of unspecified precerebral artery: Secondary | ICD-10-CM

## 2021-08-02 DIAGNOSIS — I480 Paroxysmal atrial fibrillation: Secondary | ICD-10-CM

## 2021-08-02 DIAGNOSIS — M159 Polyosteoarthritis, unspecified: Secondary | ICD-10-CM

## 2021-08-02 DIAGNOSIS — M623 Immobility syndrome (paraplegic): Secondary | ICD-10-CM | POA: Diagnosis not present

## 2021-08-02 DIAGNOSIS — F419 Anxiety disorder, unspecified: Secondary | ICD-10-CM | POA: Diagnosis not present

## 2021-08-02 DIAGNOSIS — R300 Dysuria: Secondary | ICD-10-CM | POA: Diagnosis not present

## 2021-08-02 DIAGNOSIS — Z8673 Personal history of transient ischemic attack (TIA), and cerebral infarction without residual deficits: Secondary | ICD-10-CM | POA: Diagnosis not present

## 2021-08-02 DIAGNOSIS — F339 Major depressive disorder, recurrent, unspecified: Secondary | ICD-10-CM | POA: Insufficient documentation

## 2021-08-02 MED ORDER — APIXABAN 2.5 MG PO TABS: ORAL_TABLET | ORAL | 1 refills | Status: DC

## 2021-08-02 MED ORDER — LOSARTAN POTASSIUM 25 MG PO TABS: ORAL_TABLET | ORAL | 3 refills | Status: DC

## 2021-08-02 MED ORDER — SERTRALINE HCL 50 MG PO TABS: ORAL_TABLET | ORAL | 3 refills | Status: DC

## 2021-08-02 MED ORDER — LOSARTAN POTASSIUM 25 MG PO TABS
ORAL_TABLET | ORAL | 3 refills | Status: DC
Start: 2021-08-02 — End: 2021-08-08

## 2021-08-02 NOTE — Addendum Note (Signed)
Addended by: Lynnea Ferrier on: 08/02/2021 03:13 PM   Modules accepted: Orders

## 2021-08-02 NOTE — Progress Notes (Signed)
Established Patient Office Visit  Subjective:  Patient ID: Brian [name], male    DOB: 25-Nov-1932  Age: 87 y.o. MRN: [id number]  CC:  Chief Complaint  Patient presents with   Follow-up    3 month follow up, no concerns.     HPI Brian [name] presents for follow-up of hypertension, anxiety with insomnia, debility and mild dysuria.  Patient complains of a scratchy throat and left ear discomfort.  Sertraline has been helping with anxiety and mood.  Sleeping well on the Seroquel.  Continues losartan blood pressure.  Needs refill of Eliquis with history of paroxysmal atrial fib, history of CVA.  Pressure sores have healed.  Visiting home health has completed their visits.  Patient is accompanied by his son and caregiver.  Caregiver has been turning him throughout the day.  Mr.  Past Medical History:  Diagnosis Date   Anemia    Atrial flutter (HCC)    BPH (benign prostatic hypertrophy)    Epistaxis    Hemorrhage of gastrointestinal tract, unspecified    History of open heart surgery    Hypertension    Obesity (BMI 30.0-34.9) 06/29/2020   Osteoarthrosis, unspecified whether generalized or localized, unspecified site    Personal history of venous thrombosis and embolism    Rosacea    Stroke ([place])    TIA (transient ischemic attack)     Past Surgical History:  Procedure Laterality Date   APPENDECTOMY     HERNIA REPAIR     IR GENERIC HISTORICAL  05/04/2016   IR PERCUTANEOUS ART THROMBECTOMY/INFUSION INTRACRANIAL INC DIAG ANGIO 05/04/2016 [name], MD [facility]   IR GENERIC HISTORICAL  05/04/2016   IR ANGIO VERTEBRAL SEL SUBCLAVIAN INNOMINATE UNI R MOD SED 05/04/2016 [name], MD [facility]   IR GENERIC HISTORICAL  06/07/2016   IR RADIOLOGIST EVAL & MGMT 06/07/2016 [facility]   JOINT REPLACEMENT     MITRAL VALVE REPAIR     mvp repair     RADIOLOGY WITH ANESTHESIA N/A 05/04/2016   Procedure: RADIOLOGY WITH ANESTHESIA;  Surgeon: [name], MD;   Location: [place];  Service: Radiology;  Laterality: N/A;   TOTAL HIP ARTHROPLASTY      Family History  Problem Relation Age of Onset   Rheumatic fever Mother    Coronary artery disease Father     Social History   Socioeconomic History   Marital status: Married    Spouse name: Not on file   Number of children: Not on file   Years of education: Not on file   Highest education level: Not on file  Occupational History   Not on file  Tobacco Use   Smoking status: Former   Smokeless tobacco: Never   Tobacco comments:    quit 50 yr ago  Vaping Use   Vaping Use: Never used  Substance and Sexual Activity   Alcohol use: No   Drug use: No   Sexual activity: Not on file  Other Topics Concern   Not on file  Social History Narrative   Not on file   Social Determinants of Health   Financial Resource Strain: Low Risk    Difficulty of Paying Living Expenses: Not hard at all  Food Insecurity: Not on file  Transportation Needs: No Transportation Needs   Lack of Transportation (Medical): No   Lack of Transportation (Non-Medical): No  Physical Activity: Not on file  Stress: Not on file  Social Connections: Not on file  Intimate Partner Violence: Not  on file    Outpatient Medications Prior to Visit  Medication Sig Dispense Refill   acetaminophen (TYLENOL) 500 MG tablet Take 500 mg by mouth every 6 (six) hours as needed.     cetirizine (ZYRTEC) 10 MG tablet TAKE 1 TABLET(10 MG) BY MOUTH AT BEDTIME 90 tablet 2   CMC-Calcium Alginate-Silver (TEGADERM ALGINATE AG DRESSING) 4" X 5" PADS Apply to sore on back side of left elbow once weekly. 16 each 1   finasteride (PROSCAR) 5 MG tablet TAKE 1 TABLET(5 MG) BY MOUTH DAILY 90 tablet 0   hydrocortisone (ANUCORT-HC) 25 MG suppository UNWRAP AND INSERT 1 SUPPOSITORY RECTALLY TWICE DAILY 30 suppository 10   hydrocortisone (PROCTOZONE-HC) 2.5 % rectal cream USE RECTALLY TWICE DAILY 30 g 0   hydrocortisone-pramoxine (ANALPRAM-HC) 2.5-1 % rectal  cream Place 1 application rectally 2 (two) times daily as needed for hemorrhoids.      metoprolol succinate (TOPROL-XL) 25 MG 24 hr tablet TAKE 1 TABLET(25 MG) BY MOUTH DAILY 90 tablet 3   Multiple Vitamin (MULTIVITAMIN) tablet Take 2 tablets by mouth daily.     oxyCODONE-acetaminophen (PERCOCET/ROXICET) 5-325 MG tablet Take 1 tablet by mouth 2 (two) times daily as needed for severe pain. 180 tablet 0   pantoprazole (PROTONIX) 40 MG tablet TAKE 1 TABLET(40 MG) BY MOUTH DAILY 90 tablet 0   polyethylene glycol powder (GLYCOLAX/MIRALAX) 17 GM/SCOOP powder Mix one scoop with water and take daily until stools loosen. May repeat as needed. 850 g 1   pravastatin (PRAVACHOL) 40 MG tablet TAKE 1 TABLET(40 MG) BY MOUTH DAILY 90 tablet 3   QUEtiapine (SEROQUEL) 50 MG tablet TAKE 1 TABLET(50 MG) BY MOUTH AT BEDTIME 30 tablet 2   tamsulosin (FLOMAX) 0.4 MG CAPS capsule Take 1 capsule (0.4 mg total) by mouth daily. 30 capsule 3   apixaban (ELIQUIS) 2.5 MG TABS tablet TAKE 1 TABLET(2.5 MG) BY MOUTH TWICE DAILY 180 tablet 1   losartan (COZAAR) 25 MG tablet TAKE 1 TABLET(25 MG) BY MOUTH DAILY 90 tablet 0   sertraline (ZOLOFT) 50 MG tablet TAKE 1 TABLET(50 MG) BY MOUTH EVERY MORNING 90 tablet 3   No facility-administered medications prior to visit.    Allergies  Allergen Reactions   Novocain [Procaine Hcl]     Irregular heart beat      Other     Patient had problem with epidural with his right hip surgery. Went to up extremity instead of lower extremity    Penicillins Itching    Has patient had a PCN reaction causing immediate rash, facial/tongue/throat swelling, SOB or lightheadedness with hypotension: Unk Has patient had a PCN reaction causing severe rash involving mucus membranes or skin necrosis: Unk Has patient had a PCN reaction that required hospitalization: Unk Has patient had a PCN reaction occurring within the last 10 years: No If all of the above answers are "NO", then may proceed with  Cephalosporin use.  No reaction noted    ROS Review of Systems  Constitutional:  Negative for chills, diaphoresis, fatigue, fever and unexpected weight change.  HENT:  Positive for ear pain and hearing loss. Negative for congestion and rhinorrhea.   Eyes:  Negative for photophobia and visual disturbance.  Respiratory:  Negative for shortness of breath and wheezing.   Cardiovascular: Negative.   Gastrointestinal:  Negative for nausea and vomiting.  Endocrine: Negative for polyphagia and polyuria.  Genitourinary:  Positive for dysuria. Negative for frequency.  Musculoskeletal:  Positive for gait problem.  Neurological:  Positive for weakness. Negative  for speech difficulty.  Psychiatric/Behavioral:  Negative for dysphoric mood. The patient is nervous/anxious.      Objective:    Physical Exam Vitals and nursing note reviewed.  Constitutional:      General: He is not in acute distress.    Appearance: He is ill-appearing. He is not toxic-appearing or diaphoretic.  HENT:     Head: Normocephalic and atraumatic.     Right Ear: Tympanic membrane, ear canal and external ear normal.     Left Ear: Tympanic membrane, ear canal and external ear normal.     Mouth/Throat:     Mouth: Mucous membranes are moist.     Pharynx: Oropharynx is clear. No oropharyngeal exudate or posterior oropharyngeal erythema.  Eyes:     General:        Right eye: No discharge.        Left eye: No discharge.     Extraocular Movements: Extraocular movements intact.     Conjunctiva/sclera: Conjunctivae normal.     Pupils: Pupils are equal, round, and reactive to light.  Cardiovascular:     Rate and Rhythm: Normal rate and regular rhythm.  Pulmonary:     Effort: Pulmonary effort is normal. No respiratory distress.     Breath sounds: Normal breath sounds. No wheezing or rales.  Abdominal:     General: Bowel sounds are normal.  Musculoskeletal:     Cervical back: No rigidity or tenderness.  Lymphadenopathy:      Cervical: No cervical adenopathy.  Skin:    General: Skin is warm and dry.  Neurological:     Mental Status: He is alert and oriented to person, place, and time.  Psychiatric:        Mood and Affect: Mood normal.        Behavior: Behavior normal.    BP 115/66 (BP Location: Right Arm, Patient Position: Sitting, Cuff Size: Normal)    Pulse 61    Temp (!) 97.2 F (36.2 C) (Temporal)    Ht 5\' 6"  (1.676 m)    Wt 178 lb (80.7 kg)    SpO2 95%    BMI 28.73 kg/m  Wt Readings from Last 3 Encounters:  08/02/21 178 lb (80.7 kg)  06/21/21 178 lb 12.8 oz (81.1 kg)  05/03/21 180 lb (81.6 kg)     Health Maintenance Due  Topic Date Due   Zoster Vaccines- Shingrix (1 of 2) Never done    There are no preventive care reminders to display for this patient.  Lab Results  Component Value Date   TSH 1.55 12/07/2020   Lab Results  Component Value Date   WBC 7.1 05/03/2021   HGB 13.9 05/03/2021   HCT 43.2 05/03/2021   MCV 99.3 05/03/2021   PLT 212.0 05/03/2021   Lab Results  Component Value Date   NA 136 05/03/2021   K 4.1 05/03/2021   CO2 28 05/03/2021   GLUCOSE 98 05/03/2021   BUN 13 05/03/2021   CREATININE 0.56 05/03/2021   BILITOT 0.5 05/03/2021   ALKPHOS 63 05/03/2021   AST 15 05/03/2021   ALT 12 05/03/2021   PROT 6.0 05/03/2021   ALBUMIN 3.8 05/03/2021   CALCIUM 8.8 05/03/2021   ANIONGAP 13 11/03/2018   GFR 89.37 05/03/2021   Lab Results  Component Value Date   CHOL 123 12/07/2020   Lab Results  Component Value Date   HDL 51.10 12/07/2020   Lab Results  Component Value Date   LDLCALC 56 12/07/2020   Lab Results  Component Value Date   TRIG 83.0 12/07/2020   Lab Results  Component Value Date   CHOLHDL 2 12/07/2020   Lab Results  Component Value Date   HGBA1C 5.1 12/07/2020      Assessment & Plan:   Problem List Items Addressed This Visit       Cardiovascular and Mediastinum   Essential hypertension   Relevant Medications   apixaban (ELIQUIS) 2.5  MG TABS tablet   losartan (COZAAR) 25 MG tablet   Cerebrovascular accident (CVA) due to thrombosis of precerebral artery (HCC)   Relevant Medications   apixaban (ELIQUIS) 2.5 MG TABS tablet   losartan (COZAAR) 25 MG tablet   Paroxysmal atrial fibrillation (HCC)   Relevant Medications   apixaban (ELIQUIS) 2.5 MG TABS tablet   losartan (COZAAR) 25 MG tablet     Musculoskeletal and Integument   Osteoarthritis   Paraplegic immobility syndrome - Primary     Other   Anxiety   Relevant Medications   sertraline (ZOLOFT) 50 MG tablet   Debilitated   Other Visit Diagnoses     Dysuria       Relevant Orders   Urinalysis, Routine w reflex microscopic       Meds ordered this encounter  Medications   apixaban (ELIQUIS) 2.5 MG TABS tablet    Sig: TAKE 1 TABLET(2.5 MG) BY MOUTH TWICE DAILY    Dispense:  180 tablet    Refill:  1   losartan (COZAAR) 25 MG tablet    Sig: TAKE 1 TABLET(25 MG) BY MOUTH DAILY Strength: 25 mg    Dispense:  90 tablet    Refill:  3    **Patient requests 90 days supply**   sertraline (ZOLOFT) 50 MG tablet    Sig: TAKE 1 TABLET(50 MG) BY MOUTH EVERY MORNING Strength: 50 mg    Dispense:  90 tablet    Refill:  3     Follow-up: Return in about 6 months (around 01/31/2022).    Libby Maw, MD

## 2021-08-07 ENCOUNTER — Other Ambulatory Visit: Payer: Self-pay | Admitting: Cardiovascular Disease

## 2021-08-07 DIAGNOSIS — I10 Essential (primary) hypertension: Secondary | ICD-10-CM

## 2021-08-08 NOTE — Telephone Encounter (Signed)
Rx(s) sent to pharmacy electronically.  

## 2021-08-18 ENCOUNTER — Telehealth: Payer: Self-pay | Admitting: Family Medicine

## 2021-08-18 NOTE — Telephone Encounter (Signed)
Please advise message below  °

## 2021-08-19 ENCOUNTER — Telehealth: Payer: Self-pay | Admitting: Family Medicine

## 2021-08-19 ENCOUNTER — Encounter: Payer: Self-pay | Admitting: Family Medicine

## 2021-08-19 NOTE — Telephone Encounter (Signed)
Noted. Dm/cma  

## 2021-08-19 NOTE — Telephone Encounter (Signed)
Lft VM at [phone], pt's daughters number to rtn call. Dm/cma

## 2021-08-19 NOTE — Telephone Encounter (Signed)
Lft VM to rtn call.  Waiting on response from provider.  Dm/cma

## 2021-08-23 ENCOUNTER — Encounter: Payer: Self-pay | Admitting: Physical Therapy

## 2021-08-23 ENCOUNTER — Ambulatory Visit: Payer: Medicare Other | Attending: Family Medicine | Admitting: Physical Therapy

## 2021-08-23 ENCOUNTER — Other Ambulatory Visit: Payer: Self-pay

## 2021-08-23 ENCOUNTER — Other Ambulatory Visit: Payer: Self-pay | Admitting: Family Medicine

## 2021-08-23 DIAGNOSIS — M623 Immobility syndrome (paraplegic): Secondary | ICD-10-CM | POA: Diagnosis not present

## 2021-08-23 DIAGNOSIS — I69354 Hemiplegia and hemiparesis following cerebral infarction affecting left non-dominant side: Secondary | ICD-10-CM | POA: Diagnosis not present

## 2021-08-23 DIAGNOSIS — N401 Enlarged prostate with lower urinary tract symptoms: Secondary | ICD-10-CM

## 2021-08-23 DIAGNOSIS — R29818 Other symptoms and signs involving the nervous system: Secondary | ICD-10-CM | POA: Diagnosis not present

## 2021-08-23 DIAGNOSIS — R2689 Other abnormalities of gait and mobility: Secondary | ICD-10-CM | POA: Insufficient documentation

## 2021-08-23 DIAGNOSIS — R3911 Hesitancy of micturition: Secondary | ICD-10-CM

## 2021-08-23 NOTE — Telephone Encounter (Signed)
Please see other message.  Dr Gena Fray spoke to patients son, [name].  Dm/cma

## 2021-08-23 NOTE — Therapy (Signed)
[facility] [address] [place], [place], [address] Phone: [phone]   Fax:  [phone]  Physical Therapy Evaluation  Patient Details  Name: Brian [name] MRN: [id number] Date of Birth: 1932-10-29 Referring Provider (PT): Libby Maw, MD   Encounter Date: 08/23/2021   PT End of Session - 08/23/21 1453     Visit Number 1    Number of Visits 7    Date for PT Re-Evaluation 10/22/21    Authorization Type Medicare    PT Start Time 1318    PT Stop Time 70    PT Time Calculation (min) 47 min    Equipment Utilized During Treatment Gait belt    Activity Tolerance Patient tolerated treatment well    Behavior During Therapy WFL for tasks assessed/performed             Past Medical History:  Diagnosis Date   Anemia    Atrial flutter (HCC)    BPH (benign prostatic hypertrophy)    Epistaxis    Hemorrhage of gastrointestinal tract, unspecified    History of open heart surgery    Hypertension    Obesity (BMI 30.0-34.9) 06/29/2020   Osteoarthrosis, unspecified whether generalized or localized, unspecified site    Personal history of venous thrombosis and embolism    Rosacea    Stroke ([place])    TIA (transient ischemic attack)     Past Surgical History:  Procedure Laterality Date   APPENDECTOMY     HERNIA REPAIR     IR GENERIC HISTORICAL  05/04/2016   IR PERCUTANEOUS ART THROMBECTOMY/INFUSION INTRACRANIAL INC DIAG ANGIO 05/04/2016 [name], MD [facility]   IR GENERIC HISTORICAL  05/04/2016   IR ANGIO VERTEBRAL SEL SUBCLAVIAN INNOMINATE UNI R MOD SED 05/04/2016 [name], MD [facility]   IR GENERIC HISTORICAL  06/07/2016   IR RADIOLOGIST EVAL & MGMT 06/07/2016 [facility]   JOINT REPLACEMENT     MITRAL VALVE REPAIR     mvp repair     RADIOLOGY WITH ANESTHESIA N/A 05/04/2016   Procedure: RADIOLOGY WITH ANESTHESIA;  Surgeon: [name], MD;  Location: [facility];  Service:  Radiology;  Laterality: N/A;   TOTAL HIP ARTHROPLASTY      There were no vitals filed for this visit.    Subjective Assessment - 08/23/21 1324     Subjective Received therapy at this location in July of 2022 and was given an HEP. Has not been performing at home since. Has been lazy. His son bought him a [place] - has been trying to use 2 times a week for 20 minutes a day. Stays in the bed most of the day. Had pressure sores, but they are better now, caregiver is rolling him consistently in bed. No falls. Has not walked in >4 years.    Patient is accompained by: Family member   caregiver   Pertinent History h/o Rt CVA in Oct. 2017, HTN, hx of arthritis.    Limitations Standing;House hold activities;Sitting    Patient Stated Goals pt reports that he wants to walk (has not walked in about 4 years).    Currently in Pain? Yes    Pain Score 5     Pain Location --   Neck, shoulders, knees   Pain Orientation Left    Pain Descriptors / Indicators --   "just hurts"   Pain Type Chronic pain    Aggravating Factors  "nothing really"    Pain Relieving Factors tylenol  [facility] PT Assessment - 08/23/21 1332       Assessment   Medical Diagnosis s/p chronic Rt CVA with Lt hemiparesis    Referring Provider (PT) Libby Maw, MD    Onset Date/Surgical Date 06/24/21   date of referral, CVA in Oct 2017   Hand Dominance Right    Prior Therapy OPPT: Jan. 2018 - Feb. 2018, 4 visits june-july 2022      Precautions   Precautions Fall    Precaution Comments pt is hard of hearing      Balance Screen   Has the patient fallen in the past 6 months No    Has the patient had a decrease in activity level because of a fear of falling?  No    Is the patient reluctant to leave their home because of a fear of falling?  No      Home [employer] residence    Living Arrangements Children;Other (Comment)   caregiver comes in everyday   Type of [place] Wheelchair - manual;Hospital bed;Bedside commode   uses the bedside commode to roll into the shower     Prior Function   Level of Independence Needs assistance with transfers;Needs assistance with ADLs;Needs assistance with homemaking   pt is non ambulatory, dependent with all transfers (reports son/caregiver picks him up)     Sensation   Light Touch Impaired Detail    Light Touch Impaired Details Impaired LLE    Additional Comments Numbness LLE      Posture/Postural Control   Posture/Postural Control Postural limitations    Postural Limitations Rounded Shoulders;Forward head;Increased thoracic kyphosis;Flexed trunk;Weight shift left;Posterior pelvic tilt      ROM / Strength   AROM / PROM / Strength Strength      Strength   Overall Strength Deficits    Strength Assessment Site Hip;Knee;Ankle    Right/Left Hip Right;Left    Right Hip Flexion 2+/5    Left Hip Flexion 2/5    Right/Left Knee Right;Left    Right Knee Flexion 3+/5    Right Knee Extension 3+/5    Left Knee Flexion 3/5    Left Knee Extension 3-/5    Right/Left Ankle Right    Right Ankle Dorsiflexion 4+/5    Left Ankle Dorsiflexion 3-/5      Transfers   Transfers Lateral/Scoot Transfers    Lateral/Scoot Transfers 2: Max assist   +2 therapists, mod-max assist   Lateral/Scoot Transfer Details (indicate cue type and reason) Performed from w/c <> mat, going towards pt stronger R side. Needing cues/assist for incr forward lean. Originally attempted using slideboard, however unable to properly place it on pt's cushion. Took approx. 2-3 scoots.    Comments Per pt's son and caregiver, they perform a dependent transfer and just pick pt up and put him in the bed. When pt was here last time, therapist showed how to use a sliding board at home for safety/biomechanics, but they have not used/purchased one for home.      Ambulation/Gait   Ambulation/Gait No      Balance   Balance Assessed Yes      Static Sitting  Balance   Static Sitting - Balance Support Feet supported;Right upper extremity supported;Left upper extremity supported    Static Sitting - Level of Assistance 3: Mod assist    Static Sitting - Comment/# of Minutes Pt with heavy posterior/left lateral lean, needing mod A to be able to  maintain balance. Incr difficulty with trying to shift weight to R.                        Objective measurements completed on examination: See above findings.                PT Education - 08/23/21 1450     Education Details Realistic expectations for therapy (pt reports his goal is to ambulate and has not ambulated in >4 yrs) - will review previous HEP given from last bout of therapy for strengthening/ROM (has not been performing at home) and will work on sitting balance and safety with transfers for home with son/caregiver.    Person(s) Educated Patient;Child(ren);Caregiver(s)    Methods Explanation    Comprehension Verbalized understanding              PT Short Term Goals - 08/23/21 1502       PT SHORT TERM GOAL #1   Title Patient will perform initial HEP with caregiver/son assist at home in order to address strength, ROM. ALL STGS DUE 09/13/21    Time 3    Period Weeks    Status New    Target Date 09/13/21               PT Long Term Goals - 08/23/21 1503       PT LONG TERM GOAL #1   Title Caregiver/son will correctly demonstrate HEP to assist pt with BLE AROM and strengthening as tolerated.    Time 6    Period Weeks    Status New    Target Date 10/04/21      PT LONG TERM GOAL #2   Title Pt will be able to perform seated balance at EOB with min/mod A for at least 2 minutes in order to demo improved seated tolerance/balance for IADLs/ADLs.    Baseline mod A    Time 6    Period Weeks    Status New      PT LONG TERM GOAL #3   Title Pt will perform sliding board vs. squat pivot transfer with mod A x1 from w/c <> mat table in order to demo improved  functional mobility for transfers at home with son/caregiver.    Baseline pt being transferred dependently by son/caregiver.    Time 6    Period Weeks    Status New                    Plan - 08/23/21 1505     Clinical Impression Statement Patient is a 88  year old male referred to Neuro OPPT for R CVA (2017) with L hemiparesis.   Pt's PMH is significant for: HTN, arthritis. Pt is currently nonambulatory and needs to be pushed in a manual w/c. Pt's caregiver and son performs transfers dependently at home and pt needs assist for ADLs/IADLs. Pt has not been able to walk in a few years.  The following deficits were present during the exam: postural abnormalities, decr strength, decr ROM, impaired sensation, decr functional mobility with transfers, impaired sitting balance, decr activity tolerance. Pt needing mod-max A x2 therapists today for lateral scoot transfers from w/c <> mat table going to pt's R. Attempted sliding board, but unable to position properly with pt's cushion in chair today. Pt would benefit from skilled PT to address these impairments and functional limitations to maximize functional mobility independence and finalize pt's HEP for pt to perform with caregiver/son.  Personal Factors and Comorbidities Age;Behavior Pattern;Comorbidity 2;Time since onset of injury/illness/exacerbation;Fitness;Past/Current Experience;Transportation    Comorbidities PMH: HTN, R CVA (Oct 2017)    Examination-Activity Limitations Bathing;Lift;Stand;Bed Mobility;Locomotion Level;Toileting;Bend;Transfers;Squat;Stairs;Hygiene/Grooming;Sit;Carry;Dressing    Examination-Participation Restrictions Meal Prep;Community Activity;Laundry;Shop;Cleaning    Stability/Clinical Decision Making Evolving/Moderate complexity    Clinical Decision Making Moderate    Rehab Potential Fair   due to severity of deficits.   PT Frequency 1x / week    PT Duration 6 weeks    PT Treatment/Interventions ADLs/Self Care Home  Management;Balance training;Neuromuscular re-education;Gait training;Patient/family education;Therapeutic exercise;Therapeutic activities;Functional mobility training;Passive range of motion;Manual techniques    PT Next Visit Plan Review previous HEP given at therapy last year and add/update as appropriate. Sitting tolerance/balance. Perform SciFit from pt's w/c. Working on Veterinary surgeon transfers with goal to perform at home with caregiver/son.    PT [place] and Agree with Plan of Care Patient;Family member/caregiver   pt's caregiver   Family Member Consulted son, [name]             Patient will benefit from skilled therapeutic intervention in order to improve the following deficits and impairments:  Decreased balance, Decreased mobility, Decreased strength, Impaired UE functional use, Pain, Difficulty walking, Increased muscle spasms, Decreased activity tolerance, Decreased range of motion, Impaired flexibility, Postural dysfunction, Impaired sensation  Visit Diagnosis: Hemiplegia and hemiparesis following cerebral infarction affecting left non-dominant side (HCC)  Other symptoms and signs involving the nervous system  Other abnormalities of gait and mobility     Problem List Patient Active Problem List   Diagnosis Date Noted   Pressure injury of skin of sacral region 03/29/2021   Pressure ulcer of left buttock, stage 2 ([place]) 03/29/2021   Cellulitis of left upper extremity 02/08/2021   Pressure injury of left elbow, stage 1 02/08/2021   Other insomnia 02/08/2021   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   Obesity (BMI 30.0-34.9) 06/29/2020   Acute non-recurrent sinusitis 05/11/2020   Need for influenza vaccination 05/11/2020   Paroxysmal atrial fibrillation ([place]) 05/11/2020   Benign prostatic hyperplasia with urinary hesitancy 05/11/2020   Venous ulcer, limited to breakdown of skin ([place]) 02/06/2020   Swelling of lower extremity  02/06/2020   Noise effect on both inner ears 08/12/2019   Presbycusis of both ears 08/12/2019   Left ear pain 05/20/2019   Pressure injury of right buttock, stage 1 05/20/2019   Debilitated 05/20/2019   Elevated glucose 04/08/2019   Osteoarthritis of right knee 03/25/2019   Serum potassium elevated 03/18/2019   Hyperlipidemia 09/24/2018   Gait disturbance 09/24/2018   Anxiety 04/30/2018   Lumbar radiculopathy 02/14/2018   Pain in joint of left shoulder 12/28/2017   Osteoarthritis of left glenohumeral joint 12/28/2017   Pain in left foot 10/23/2017   History of cerebrovascular accident 10/18/2017   Coronary artery disease involving native coronary artery of native heart without angina pectoris    Persistent atrial fibrillation (HCC)    Dysarthria, post-stroke    Dysphagia, post-stroke    Cerebrovascular accident (CVA) due to thrombosis of precerebral artery ([place])    Scoliosis (and kyphoscoliosis), idiopathic 01/26/2014   Arthralgia of hip 02/07/2012   Osteoarthritis of hip 02/07/2012   Sacroiliitis ([place]) 02/07/2012   ROTATOR CUFF SYNDROME, LEFT 03/07/2010   OTH MALIG NEOPLASM SKIN OTH&UNSPEC PARTS FACE 12/06/2009   ACTINIC KERATOSIS, HEAD 09/06/2009   Osteoarthritis 02/20/2009   History of mitral valve repair 02/20/2009   HIP REPLACEMENT, TOTAL, HX OF 02/20/2009   APPENDECTOMY, HX  OF 02/20/2009   HERNIORRHAPHY, HX OF 02/20/2009   EPISTAXIS, RECURRENT 11/19/2008   UNS ADVRS EFF UNS RX MEDICINAL&BIOLOGICAL SBSTNC 06/12/2008   OTHER AND UNSPECIFIED MITRAL VALVE DISEASES 03/12/2008   ATRIAL FLUTTER 03/12/2008   Essential hypertension 03/06/2007   PEPTIC ULCER DISEASE 03/06/2007   BENIGN PROSTATIC HYPERTROPHY 03/06/2007   Coronary atherosclerosis 02/13/2007   ROSACEA 02/13/2007   DVT, HX OF 02/13/2007    Arliss Journey, PT, DPT  08/23/2021, 3:15 PM  [facility] [address] [place], [place], [address] Phone:  [phone]   Fax:  [phone]  Name: AQEEL NORGAARD MRN: [id number] Date of Birth: 09-30-1932

## 2021-08-23 NOTE — Telephone Encounter (Signed)
Patients daughter calling states that Brian [name] seems to continue to be in lots of pain she would like a referral to pain clinic without coming in for evaluation. Please advise.

## 2021-08-23 NOTE — Telephone Encounter (Signed)
Spoke with patients daughter who states that is hard to get patient up to leave the house she would prefer to schedule virtual visit when one of patients care takers are at the home any day after 12pm. Appointment scheduled

## 2021-08-30 ENCOUNTER — Other Ambulatory Visit: Payer: Self-pay

## 2021-08-30 ENCOUNTER — Ambulatory Visit: Payer: Medicare Other | Admitting: Physical Therapy

## 2021-08-30 DIAGNOSIS — M623 Immobility syndrome (paraplegic): Secondary | ICD-10-CM | POA: Diagnosis not present

## 2021-08-30 DIAGNOSIS — I69354 Hemiplegia and hemiparesis following cerebral infarction affecting left non-dominant side: Secondary | ICD-10-CM | POA: Diagnosis not present

## 2021-08-30 DIAGNOSIS — R29818 Other symptoms and signs involving the nervous system: Secondary | ICD-10-CM | POA: Diagnosis not present

## 2021-08-30 DIAGNOSIS — R2689 Other abnormalities of gait and mobility: Secondary | ICD-10-CM

## 2021-08-30 NOTE — Therapy (Signed)
[facility] [address] [place], [place], [address] Phone: [phone]   Fax:  [phone]  Physical Therapy Treatment  Patient Details  Name: Brian [name] MRN: [id number] Date of Birth: 16-Nov-1932 Referring Provider (PT): Libby Maw, MD   Encounter Date: 08/30/2021   PT End of Session - 08/30/21 1517     Visit Number 2    Number of Visits 7    Date for PT Re-Evaluation 10/22/21    Authorization Type Medicare    PT Start Time 6789    PT Stop Time 3810    PT Time Calculation (min) 42 min    Equipment Utilized During Treatment Gait belt    Activity Tolerance Patient tolerated treatment well    Behavior During Therapy WFL for tasks assessed/performed             Past Medical History:  Diagnosis Date   Anemia    Atrial flutter (HCC)    BPH (benign prostatic hypertrophy)    Epistaxis    Hemorrhage of gastrointestinal tract, unspecified    History of open heart surgery    Hypertension    Obesity (BMI 30.0-34.9) 06/29/2020   Osteoarthrosis, unspecified whether generalized or localized, unspecified site    Personal history of venous thrombosis and embolism    Rosacea    Stroke ([place])    TIA (transient ischemic attack)     Past Surgical History:  Procedure Laterality Date   APPENDECTOMY     HERNIA REPAIR     IR GENERIC HISTORICAL  05/04/2016   IR PERCUTANEOUS ART THROMBECTOMY/INFUSION INTRACRANIAL INC DIAG ANGIO 05/04/2016 [name], MD [facility]   IR GENERIC HISTORICAL  05/04/2016   IR ANGIO VERTEBRAL SEL SUBCLAVIAN INNOMINATE UNI R MOD SED 05/04/2016 [name], MD [facility]   IR GENERIC HISTORICAL  06/07/2016   IR RADIOLOGIST EVAL & MGMT 06/07/2016 [facility]   JOINT REPLACEMENT     MITRAL VALVE REPAIR     mvp repair     RADIOLOGY WITH ANESTHESIA N/A 05/04/2016   Procedure: RADIOLOGY WITH ANESTHESIA;  Surgeon: [name], MD;  Location: [facility];  Service:  Radiology;  Laterality: N/A;   TOTAL HIP ARTHROPLASTY      There were no vitals filed for this visit.   Subjective Assessment - 08/30/21 1620     Subjective Son reports nothing new at home. No falls. Caregiver has been assisting pt with ROM at home.    Patient is accompained by: Family member   caregiver   Pertinent History h/o Rt CVA in Oct. 2017, HTN, hx of arthritis.    Limitations Standing;House hold activities;Sitting    Patient Stated Goals pt reports that he wants to walk (has not walked in about 4 years).    Currently in Pain? Yes    Pain Score 5     Pain Orientation Left    Pain Type Chronic pain                [facility] PT Assessment - 08/30/21 0001       Assessment   Medical Diagnosis s/p chronic Rt CVA with Lt hemiparesis    Referring Provider (PT) Libby Maw, MD    Onset Date/Surgical Date 06/24/21    Hand Dominance Right    Prior Therapy [facility]: Jan. 2018 - Feb. 2018, 4 visits june-july 2022      Precautions   Precautions Fall    Precaution Comments pt is hard of hearing  [facility] Adult PT Treatment/Exercise - 08/30/21 0001       Transfers   Transfers Lateral/Scoot Transfers    Lateral/Scoot Transfers 2: Max assist   +2 for safety   Lateral/Scoot Transfer Details (indicate cue type and reason) performed with sliding board. Multiple cues for forward lean and push up      Posture/Postural Control   Postural Limitations Rounded Shoulders;Forward head;Increased thoracic kyphosis;Flexed trunk;Weight shift left;Posterior pelvic tilt      Exercises   Exercises Knee/Hip      Knee/Hip Exercises: Stretches   Passive Hamstring Stretch 3 reps;30 seconds      Knee/Hip Exercises: Aerobic   Other Aerobic Sci fit in w/c: L1 x 8 min LEs only maintaining >35 steps/min      Knee/Hip Exercises: Supine   Heel Slides Strengthening;2 sets;10 reps;Right;Left    Bridges Strengthening;Both;2 sets;10 reps    Bridges  Limitations "Beginner's bridge"    Straight Leg Raises Strengthening;Right;Left;2 sets;10 reps    Straight Leg Raises Limitations AAROM on L    Other Supine Knee/Hip Exercises Bent knee fallouts 2x10              Seated balance: Forward trunk lean x10 Static balance with head nod up 3x30 sec Static balance with R and then L UE flexion x10 each side        PT Education - 08/30/21 1621     Education Details Discussed with son and caregiver to allow pt to do more when performing exercises.    Person(s) Educated Patient;Caregiver(s);Child(ren)    Methods Explanation    Comprehension Verbalized understanding              PT Short Term Goals - 08/23/21 1502       PT SHORT TERM GOAL #1   Title Patient will perform initial HEP with caregiver/son assist at home in order to address strength, ROM. ALL STGS DUE 09/13/21    Time 3    Period Weeks    Status New    Target Date 09/13/21               PT Long Term Goals - 08/23/21 1503       PT LONG TERM GOAL #1   Title Caregiver/son will correctly demonstrate HEP to assist pt with BLE AROM and strengthening as tolerated.    Time 6    Period Weeks    Status New    Target Date 10/04/21      PT LONG TERM GOAL #2   Title Pt will be able to perform seated balance at EOB with min/mod A for at least 2 minutes in order to demo improved seated tolerance/balance for IADLs/ADLs.    Baseline mod A    Time 6    Period Weeks    Status New      PT LONG TERM GOAL #3   Title Pt will perform sliding board vs. squat pivot transfer with mod A x1 from w/c <> mat table in order to demo improved functional mobility for transfers at home with son/caregiver.    Baseline pt being transferred dependently by son/caregiver.    Time 6    Period Weeks    Status New                   Plan - 08/30/21 1624     Clinical Impression Statement Re-initiated use of sliding board to encourage increased participation from pt to perform  transfers. Pt easily distractible and requires redirection.  Reviewed pt's HEP and worked on sitting balance at EOB (pt tends to lean backwards). Encouraged pt's caregiver to make pt do more of the exercises himself to maintain strength.    Personal Factors and Comorbidities Age;Behavior Pattern;Comorbidity 2;Time since onset of injury/illness/exacerbation;Fitness;Past/Current Experience;Transportation    Comorbidities PMH: HTN, R CVA (Oct 2017)    Examination-Activity Limitations Bathing;Lift;Stand;Bed Mobility;Locomotion Level;Toileting;Bend;Transfers;Squat;Stairs;Hygiene/Grooming;Sit;Carry;Dressing    Examination-Participation Restrictions Meal Prep;Community Activity;Laundry;Shop;Cleaning    Stability/Clinical Decision Making Evolving/Moderate complexity    Rehab Potential Fair   due to severity of deficits.   PT Frequency 1x / week    PT Duration 6 weeks    PT Treatment/Interventions ADLs/Self Care Home Management;Balance training;Neuromuscular re-education;Gait training;Patient/family education;Therapeutic exercise;Therapeutic activities;Functional mobility training;Passive range of motion;Manual techniques    PT Next Visit Plan Review previous HEP and add/update as appropriate. Sitting tolerance/balance. Perform SciFit from pt's w/c. Working on Veterinary surgeon transfers with goal to perform at home with caregiver/son.    PT [place] and Agree with Plan of Care Patient;Family member/caregiver   pt's caregiver   Family Member Consulted son, [name]             Patient will benefit from skilled therapeutic intervention in order to improve the following deficits and impairments:  Decreased balance, Decreased mobility, Decreased strength, Impaired UE functional use, Pain, Difficulty walking, Increased muscle spasms, Decreased activity tolerance, Decreased range of motion, Impaired flexibility, Postural dysfunction, Impaired sensation  Visit  Diagnosis: Hemiplegia and hemiparesis following cerebral infarction affecting left non-dominant side (HCC)  Other symptoms and signs involving the nervous system  Other abnormalities of gait and mobility     Problem List Patient Active Problem List   Diagnosis Date Noted   Pressure injury of skin of sacral region 03/29/2021   Pressure ulcer of left buttock, stage 2 ([place]) 03/29/2021   Cellulitis of left upper extremity 02/08/2021   Pressure injury of left elbow, stage 1 02/08/2021   Other insomnia 02/08/2021   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   Obesity (BMI 30.0-34.9) 06/29/2020   Acute non-recurrent sinusitis 05/11/2020   Need for influenza vaccination 05/11/2020   Paroxysmal atrial fibrillation ([place]) 05/11/2020   Benign prostatic hyperplasia with urinary hesitancy 05/11/2020   Venous ulcer, limited to breakdown of skin ([place]) 02/06/2020   Swelling of lower extremity 02/06/2020   Noise effect on both inner ears 08/12/2019   Presbycusis of both ears 08/12/2019   Left ear pain 05/20/2019   Pressure injury of right buttock, stage 1 05/20/2019   Debilitated 05/20/2019   Elevated glucose 04/08/2019   Osteoarthritis of right knee 03/25/2019   Serum potassium elevated 03/18/2019   Hyperlipidemia 09/24/2018   Gait disturbance 09/24/2018   Anxiety 04/30/2018   Lumbar radiculopathy 02/14/2018   Pain in joint of left shoulder 12/28/2017   Osteoarthritis of left glenohumeral joint 12/28/2017   Pain in left foot 10/23/2017   History of cerebrovascular accident 10/18/2017   Coronary artery disease involving native coronary artery of native heart without angina pectoris    Persistent atrial fibrillation (HCC)    Dysarthria, post-stroke    Dysphagia, post-stroke    Cerebrovascular accident (CVA) due to thrombosis of precerebral artery ([place])    Scoliosis (and kyphoscoliosis), idiopathic 01/26/2014   Arthralgia of hip 02/07/2012   Osteoarthritis of hip 02/07/2012    Sacroiliitis ([place]) 02/07/2012   ROTATOR CUFF SYNDROME, LEFT 03/07/2010   OTH MALIG NEOPLASM SKIN OTH&UNSPEC PARTS FACE 12/06/2009   ACTINIC KERATOSIS, HEAD 09/06/2009   Osteoarthritis  02/20/2009   History of mitral valve repair 02/20/2009   HIP REPLACEMENT, TOTAL, HX OF 02/20/2009   APPENDECTOMY, HX OF 02/20/2009   HERNIORRHAPHY, HX OF 02/20/2009   EPISTAXIS, RECURRENT 11/19/2008   UNS ADVRS EFF UNS RX MEDICINAL&BIOLOGICAL SBSTNC 06/12/2008   OTHER AND UNSPECIFIED MITRAL VALVE DISEASES 03/12/2008   ATRIAL FLUTTER 03/12/2008   Essential hypertension 03/06/2007   PEPTIC ULCER DISEASE 03/06/2007   BENIGN PROSTATIC HYPERTROPHY 03/06/2007   Coronary atherosclerosis 02/13/2007   ROSACEA 02/13/2007   DVT, HX OF 02/13/2007    Nathali Vent April Gordy Levan, PT, DPT 08/30/2021, 4:34 PM  [facility] [address] [place], [place], [address] Phone: [phone]   Fax:  [phone]  Name: Brian [name] MRN: [id number] Date of Birth: 1933/06/23

## 2021-09-06 ENCOUNTER — Ambulatory Visit: Payer: Medicare Other | Admitting: Physical Therapy

## 2021-09-06 ENCOUNTER — Telehealth: Payer: Medicare Other

## 2021-09-06 ENCOUNTER — Other Ambulatory Visit: Payer: Self-pay

## 2021-09-06 DIAGNOSIS — R2689 Other abnormalities of gait and mobility: Secondary | ICD-10-CM | POA: Diagnosis not present

## 2021-09-06 DIAGNOSIS — I69354 Hemiplegia and hemiparesis following cerebral infarction affecting left non-dominant side: Secondary | ICD-10-CM | POA: Diagnosis not present

## 2021-09-06 DIAGNOSIS — M623 Immobility syndrome (paraplegic): Secondary | ICD-10-CM | POA: Diagnosis not present

## 2021-09-06 DIAGNOSIS — R29818 Other symptoms and signs involving the nervous system: Secondary | ICD-10-CM | POA: Diagnosis not present

## 2021-09-06 NOTE — Therapy (Signed)
[facility] [address] [place], [place], [address] Phone: [phone]   Fax:  [phone]  Physical Therapy Treatment  Patient Details  Name: Brian [name] MRN: [id number] Date of Birth: [birth year] Referring Provider (PT): Libby Maw, MD   Encounter Date: 09/06/2021   PT End of Session - 09/06/21 1300     Visit Number 3    Number of Visits 7    Date for PT Re-Evaluation 10/22/21    Authorization Type Medicare    PT Start Time 1305    PT Stop Time 1345    PT Time Calculation (min) 40 min    Equipment Utilized During Treatment Gait belt    Activity Tolerance Patient tolerated treatment well    Behavior During Therapy WFL for tasks assessed/performed             Past Medical History:  Diagnosis Date   Anemia    Atrial flutter (HCC)    BPH (benign prostatic hypertrophy)    Epistaxis    Hemorrhage of gastrointestinal tract, unspecified    History of open heart surgery    Hypertension    Obesity (BMI 30.0-34.9) 06/29/2020   Osteoarthrosis, unspecified whether generalized or localized, unspecified site    Personal history of venous thrombosis and embolism    Rosacea    Stroke ([place])    TIA (transient ischemic attack)     Past Surgical History:  Procedure Laterality Date   APPENDECTOMY     HERNIA REPAIR     IR GENERIC HISTORICAL  05/04/2016   IR PERCUTANEOUS ART THROMBECTOMY/INFUSION INTRACRANIAL INC DIAG ANGIO 05/04/2016 [name], MD [facility]   IR GENERIC HISTORICAL  05/04/2016   IR ANGIO VERTEBRAL SEL SUBCLAVIAN INNOMINATE UNI R MOD SED 05/04/2016 [name], MD [facility]   IR GENERIC HISTORICAL  06/07/2016   IR RADIOLOGIST EVAL & MGMT 06/07/2016 [facility]   JOINT REPLACEMENT     MITRAL VALVE REPAIR     mvp repair     RADIOLOGY WITH ANESTHESIA N/A 05/04/2016   Procedure: RADIOLOGY WITH ANESTHESIA;  Surgeon: [name], MD;  Location: [facility];  Service:  Radiology;  Laterality: N/A;   TOTAL HIP ARTHROPLASTY      There were no vitals filed for this visit.   Subjective Assessment - 09/06/21 1316     Subjective Son reports nothing new or different at home.    Patient is accompained by: Family member   caregiver   Pertinent History h/o Rt CVA in Oct. 2017, HTN, hx of arthritis.    Limitations Standing;House hold activities;Sitting    Patient Stated Goals pt reports that he wants to walk (has not walked in about 4 years).                [facility] PT Assessment - 09/06/21 0001       Assessment   Medical Diagnosis s/p chronic Rt CVA with Lt hemiparesis    Referring Provider (PT) Libby Maw, MD    Onset Date/Surgical Date 06/24/21    Hand Dominance Right                           [facility] Adult PT Treatment/Exercise - 09/06/21 0001       Transfers   Transfers Sit to Stand    Sit to Stand 2: Max assist;With upper extremity assist;From bed    Sit to Stand Details Tactile cues for initiation;Tactile cues for  sequencing;Tactile cues for weight shifting;Tactile cues for placement;Tactile cues for weight beaing;Manual facilitation for weight shifting;Manual facilitation for weight bearing;Verbal cues for sequencing   pulling up to stand on chair. 3x10 sec hold   Lateral/Scoot Transfers 2: Max assist;3: Mod assist    Lateral/Scoot Transfer Details (indicate cue type and reason) with sliding board. Mod A at end of session after practicing "lift off" into standing      Knee/Hip Exercises: Aerobic   Other Aerobic Sci fit in w/c: L1 x 15 min LEs only maintaining >35 steps/min      Knee/Hip Exercises: Seated   Long Arc Quad Strengthening;Left;Right;2 sets;10 reps    Heel Slides Strengthening;Right;Left;2 sets;10 reps    Other Seated Knee/Hip Exercises clamshell no band 2x10    Other Seated Knee/Hip Exercises pillow squeeze 2x10    Marching Right;10 reps;AAROM      Knee/Hip Exercises: Supine   Bridges  Strengthening;Both;2 sets;10 reps    Bridges Limitations "Beginner's bridge"    Other Supine Knee/Hip Exercises Pt request to perform crunches x10                       PT Short Term Goals - 08/23/21 1502       PT SHORT TERM GOAL #1   Title Patient will perform initial HEP with caregiver/son assist at home in order to address strength, ROM. ALL STGS DUE 09/13/21    Time 3    Period Weeks    Status New    Target Date 09/13/21               PT Long Term Goals - 08/23/21 1503       PT LONG TERM GOAL #1   Title Caregiver/son will correctly demonstrate HEP to assist pt with BLE AROM and strengthening as tolerated.    Time 6    Period Weeks    Status New    Target Date 10/04/21      PT LONG TERM GOAL #2   Title Pt will be able to perform seated balance at EOB with min/mod A for at least 2 minutes in order to demo improved seated tolerance/balance for IADLs/ADLs.    Baseline mod A    Time 6    Period Weeks    Status New      PT LONG TERM GOAL #3   Title Pt will perform sliding board vs. squat pivot transfer with mod A x1 from w/c <> mat table in order to demo improved functional mobility for transfers at home with son/caregiver.    Baseline pt being transferred dependently by son/caregiver.    Time 6    Period Weeks    Status New                   Plan - 09/06/21 1318     Clinical Impression Statement Treatment focused on continuing to improve pt's sitting balance and transfers. Pt enjoys using sci-fit. Pt required less assist with sitting balance this session. Worked on improving pt's "lift off" for pt to perform scooting t/fs with transfer board. Trial using yoga block for pt to push up to stand vs pull up.    Personal Factors and Comorbidities Age;Behavior Pattern;Comorbidity 2;Time since onset of injury/illness/exacerbation;Fitness;Past/Current Experience;Transportation    Comorbidities PMH: HTN, R CVA (Oct 2017)    Examination-Activity  Limitations Bathing;Lift;Stand;Bed Mobility;Locomotion Level;Toileting;Bend;Transfers;Squat;Stairs;Hygiene/Grooming;Sit;Carry;Dressing    Examination-Participation Restrictions Meal Prep;Community Activity;Laundry;Shop;Cleaning    Stability/Clinical Decision Making Evolving/Moderate complexity  Rehab Potential Fair   due to severity of deficits.   PT Frequency 1x / week    PT Duration 6 weeks    PT Treatment/Interventions ADLs/Self Care Home Management;Balance training;Neuromuscular re-education;Gait training;Patient/family education;Therapeutic exercise;Therapeutic activities;Functional mobility training;Passive range of motion;Manual techniques    PT Next Visit Plan Review previous HEP and add/update as appropriate. Sitting tolerance/balance -- work on transfers. Try using yoga blocks for pt to push up to stand. Perform SciFit from pt's w/c. Working on Veterinary surgeon transfers with goal to perform at home with caregiver/son.    PT [place] and Agree with Plan of Care Patient;Family member/caregiver   pt's caregiver   Family Member Consulted son, [name]             Patient will benefit from skilled therapeutic intervention in order to improve the following deficits and impairments:  Decreased balance, Decreased mobility, Decreased strength, Impaired UE functional use, Pain, Difficulty walking, Increased muscle spasms, Decreased activity tolerance, Decreased range of motion, Impaired flexibility, Postural dysfunction, Impaired sensation  Visit Diagnosis: Hemiplegia and hemiparesis following cerebral infarction affecting left non-dominant side (HCC)  Other symptoms and signs involving the nervous system  Other abnormalities of gait and mobility     Problem List Patient Active Problem List   Diagnosis Date Noted   Pressure injury of skin of sacral region 03/29/2021   Pressure ulcer of left buttock, stage 2 ([place]) 03/29/2021   Cellulitis of  left upper extremity 02/08/2021   Pressure injury of left elbow, stage 1 02/08/2021   Other insomnia 02/08/2021   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   Obesity (BMI 30.0-34.9) 06/29/2020   Acute non-recurrent sinusitis 05/11/2020   Need for influenza vaccination 05/11/2020   Paroxysmal atrial fibrillation ([place]) 05/11/2020   Benign prostatic hyperplasia with urinary hesitancy 05/11/2020   Venous ulcer, limited to breakdown of skin ([place]) 02/06/2020   Swelling of lower extremity 02/06/2020   Noise effect on both inner ears 08/12/2019   Presbycusis of both ears 08/12/2019   Left ear pain 05/20/2019   Pressure injury of right buttock, stage 1 05/20/2019   Debilitated 05/20/2019   Elevated glucose 04/08/2019   Osteoarthritis of right knee 03/25/2019   Serum potassium elevated 03/18/2019   Hyperlipidemia 09/24/2018   Gait disturbance 09/24/2018   Anxiety 04/30/2018   Lumbar radiculopathy 02/14/2018   Pain in joint of left shoulder 12/28/2017   Osteoarthritis of left glenohumeral joint 12/28/2017   Pain in left foot 10/23/2017   History of cerebrovascular accident 10/18/2017   Coronary artery disease involving native coronary artery of native heart without angina pectoris    Persistent atrial fibrillation (HCC)    Dysarthria, post-stroke    Dysphagia, post-stroke    Cerebrovascular accident (CVA) due to thrombosis of precerebral artery ([place])    Scoliosis (and kyphoscoliosis), idiopathic 01/26/2014   Arthralgia of hip 02/07/2012   Osteoarthritis of hip 02/07/2012   Sacroiliitis ([place]) 02/07/2012   ROTATOR CUFF SYNDROME, LEFT 03/07/2010   OTH MALIG NEOPLASM SKIN OTH&UNSPEC PARTS FACE 12/06/2009   ACTINIC KERATOSIS, HEAD 09/06/2009   Osteoarthritis 02/20/2009   History of mitral valve repair 02/20/2009   HIP REPLACEMENT, TOTAL, HX OF 02/20/2009   APPENDECTOMY, HX OF 02/20/2009   HERNIORRHAPHY, HX OF 02/20/2009   EPISTAXIS, RECURRENT 11/19/2008   UNS ADVRS EFF UNS RX  MEDICINAL&BIOLOGICAL SBSTNC 06/12/2008   OTHER AND UNSPECIFIED MITRAL VALVE DISEASES 03/12/2008   ATRIAL FLUTTER 03/12/2008   Essential hypertension 03/06/2007   PEPTIC ULCER  DISEASE 03/06/2007   BENIGN PROSTATIC HYPERTROPHY 03/06/2007   Coronary atherosclerosis 02/13/2007   ROSACEA 02/13/2007   DVT, HX OF 02/13/2007    [facility] April Gordy Levan, [place], DPT 09/06/2021, 2:15 PM  [facility] [address] [place], [place], [address] Phone: [phone]   Fax:  [phone]  Name: Brian [name] MRN: [id number] Date of Birth: 1933/04/15

## 2021-09-09 ENCOUNTER — Telehealth (INDEPENDENT_AMBULATORY_CARE_PROVIDER_SITE_OTHER): Payer: Medicare Other | Admitting: Family Medicine

## 2021-09-09 ENCOUNTER — Encounter: Payer: Self-pay | Admitting: Family Medicine

## 2021-09-09 VITALS — Ht 66.0 in

## 2021-09-09 DIAGNOSIS — M1990 Unspecified osteoarthritis, unspecified site: Secondary | ICD-10-CM | POA: Diagnosis not present

## 2021-09-09 DIAGNOSIS — Z7401 Bed confinement status: Secondary | ICD-10-CM | POA: Diagnosis not present

## 2021-09-09 DIAGNOSIS — G894 Chronic pain syndrome: Secondary | ICD-10-CM | POA: Diagnosis not present

## 2021-09-09 DIAGNOSIS — K5909 Other constipation: Secondary | ICD-10-CM | POA: Diagnosis not present

## 2021-09-09 DIAGNOSIS — L89159 Pressure ulcer of sacral region, unspecified stage: Secondary | ICD-10-CM | POA: Diagnosis not present

## 2021-09-09 MED ORDER — PREGABALIN 25 MG PO CAPS: 25.0000 mg | ORAL_CAPSULE | Freq: Three times a day (TID) | ORAL | 1 refills | Status: DC

## 2021-09-09 MED ORDER — OXYCODONE-ACETAMINOPHEN 7.5-325 MG PO TABS: 1.0000 | ORAL_TABLET | Freq: Three times a day (TID) | ORAL | 0 refills | Status: DC | PRN

## 2021-09-09 NOTE — Progress Notes (Signed)
Member her name what was her  Established Patient Office Visit  Subjective:  Patient ID: Brian [name], male    DOB: [birth year]  Age: 88 y.o. MRN: [id number]  CC:  Chief Complaint  Patient presents with   Referral    Referral for pain clinic    HPI Brian [name] presents for a discussion and update of the state of his current pain management.  He is accompanied by his caregiver Neoma Laming.  Patient's daughter has requested that he be referred to pain management.  I wanted to speak with the patient directly about that.  Longstanding history of extensive arthritic disease involving his knees hips lower back and left shoulder.  Status post left hip replacement and rotator cuff repair of the left shoulder.  Above is complicated by a CVA in [id number] that had left him with a l dense left hemiparesis.  He has taken oxycodone 5/325 twice daily for some time with relief that is no longer helping.  They have tried Tylenol 500 every 6 as needed, Voltaren cream as needed and capsaicin.  He receives injections into the left shoulder periodically with temporary relief.  Past Medical History:  Diagnosis Date   Anemia    Atrial flutter (HCC)    BPH (benign prostatic hypertrophy)    Epistaxis    Hemorrhage of gastrointestinal tract, unspecified    History of open heart surgery    Hypertension    Obesity (BMI 30.0-34.9) 06/29/2020   Osteoarthrosis, unspecified whether generalized or localized, unspecified site    Personal history of venous thrombosis and embolism    Rosacea    Stroke ([place])    TIA (transient ischemic attack)     Past Surgical History:  Procedure Laterality Date   APPENDECTOMY     HERNIA REPAIR     IR GENERIC HISTORICAL  05/04/2016   IR PERCUTANEOUS ART THROMBECTOMY/INFUSION INTRACRANIAL INC DIAG ANGIO 05/04/2016 [name], MD [facility]   IR GENERIC HISTORICAL  05/04/2016   IR ANGIO VERTEBRAL SEL SUBCLAVIAN INNOMINATE UNI R MOD SED 05/04/2016 [name], MD [facility]    IR GENERIC HISTORICAL  06/07/2016   IR RADIOLOGIST EVAL & MGMT 06/07/2016 [facility]   JOINT REPLACEMENT     MITRAL VALVE REPAIR     mvp repair     RADIOLOGY WITH ANESTHESIA N/A 05/04/2016   Procedure: RADIOLOGY WITH ANESTHESIA;  Surgeon: [name], MD;  Location: [facility];  Service: Radiology;  Laterality: N/A;   TOTAL HIP ARTHROPLASTY      Family History  Problem Relation Age of Onset   Rheumatic fever Mother    Coronary artery disease Father     Social History   Socioeconomic History   Marital status: Married    Spouse name: Not on file   Number of children: Not on file   Years of education: Not on file   Highest education level: Not on file  Occupational History   Not on file  Tobacco Use   Smoking status: Former   Smokeless tobacco: Never   Tobacco comments:    quit 50 yr ago  Vaping Use   Vaping Use: Never used  Substance and Sexual Activity   Alcohol use: No   Drug use: No   Sexual activity: Not on file  Other Topics Concern   Not on file  Social History Narrative   Not on file   Social Determinants of Health   Financial Resource Strain: Not on file  Food Insecurity: Not on file  Transportation Needs: Not on file  Physical Activity: Not on file  Stress: Not on file  Social Connections: Not on file  Intimate Partner Violence: Not on file    Outpatient Medications Prior to Visit  Medication Sig Dispense Refill   acetaminophen (TYLENOL) 500 MG tablet Take 500 mg by mouth every 6 (six) hours as needed.     apixaban (ELIQUIS) 2.5 MG TABS tablet TAKE 1 TABLET(2.5 MG) BY MOUTH TWICE DAILY 180 tablet 1   cetirizine (ZYRTEC) 10 MG tablet TAKE 1 TABLET(10 MG) BY MOUTH AT BEDTIME 90 tablet 2   CMC-Calcium Alginate-Silver (TEGADERM ALGINATE AG DRESSING) 4" X 5" PADS Apply to sore on back side of left elbow once weekly. 16 each 1   finasteride (PROSCAR) 5 MG tablet TAKE 1 TABLET(5 MG) BY MOUTH DAILY 90 tablet 0   hydrocortisone (ANUCORT-HC) 25 MG  suppository UNWRAP AND INSERT 1 SUPPOSITORY RECTALLY TWICE DAILY 30 suppository 10   hydrocortisone (PROCTOZONE-HC) 2.5 % rectal cream USE RECTALLY TWICE DAILY 30 g 0   hydrocortisone-pramoxine (ANALPRAM-HC) 2.5-1 % rectal cream Place 1 application rectally 2 (two) times daily as needed for hemorrhoids.      losartan (COZAAR) 25 MG tablet TAKE 1 TABLET(25 MG) BY MOUTH DAILY 90 tablet 2   metoprolol succinate (TOPROL-XL) 25 MG 24 hr tablet TAKE 1 TABLET(25 MG) BY MOUTH DAILY 90 tablet 3   Multiple Vitamin (MULTIVITAMIN) tablet Take 2 tablets by mouth daily.     pantoprazole (PROTONIX) 40 MG tablet TAKE 1 TABLET(40 MG) BY MOUTH DAILY 90 tablet 0   polyethylene glycol powder (GLYCOLAX/MIRALAX) 17 GM/SCOOP powder Mix one scoop with water and take daily until stools loosen. May repeat as needed. 850 g 1   pravastatin (PRAVACHOL) 40 MG tablet TAKE 1 TABLET(40 MG) BY MOUTH DAILY 90 tablet 3   QUEtiapine (SEROQUEL) 50 MG tablet TAKE 1 TABLET(50 MG) BY MOUTH AT BEDTIME 30 tablet 2   sertraline (ZOLOFT) 50 MG tablet TAKE 1 TABLET(50 MG) BY MOUTH EVERY MORNING Strength: 50 mg 90 tablet 3   tamsulosin (FLOMAX) 0.4 MG CAPS capsule TAKE 1 CAPSULE(0.4 MG) BY MOUTH DAILY 90 capsule 3   oxyCODONE-acetaminophen (PERCOCET/ROXICET) 5-325 MG tablet Take 1 tablet by mouth 2 (two) times daily as needed for severe pain. 180 tablet 0   No facility-administered medications prior to visit.    Allergies  Allergen Reactions   Novocain [Procaine Hcl]     Irregular heart beat      Other     Patient had problem with epidural with his right hip surgery. Went to up extremity instead of lower extremity    Penicillins Itching    Has patient had a PCN reaction causing immediate rash, facial/tongue/throat swelling, SOB or lightheadedness with hypotension: Unk Has patient had a PCN reaction causing severe rash involving mucus membranes or skin necrosis: Unk Has patient had a PCN reaction that required hospitalization:  Unk Has patient had a PCN reaction occurring within the last 10 years: No If all of the above answers are "NO", then may proceed with Cephalosporin use.  No reaction noted    ROS Review of Systems  Constitutional:  Negative for diaphoresis, fatigue, fever and unexpected weight change.  Respiratory: Negative.    Cardiovascular: Negative.   Gastrointestinal:  Positive for constipation.  Musculoskeletal:  Positive for arthralgias, back pain, neck pain and neck stiffness.  Neurological:  Positive for speech difficulty and weakness.     Objective:    Physical Exam Vitals and nursing note  reviewed.  Constitutional:      General: He is not in acute distress.    Appearance: Normal appearance. He is not ill-appearing, toxic-appearing or diaphoretic.  HENT:     Head: Normocephalic and atraumatic.  Pulmonary:     Effort: Pulmonary effort is normal.  Neurological:     Mental Status: He is alert. Mental status is at baseline.  Psychiatric:        Mood and Affect: Mood normal.        Behavior: Behavior normal.    Ht 5\' 6"  (1.676 m)    BMI 28.73 kg/m  Wt Readings from Last 3 Encounters:  08/02/21 178 lb (80.7 kg)  06/21/21 178 lb 12.8 oz (81.1 kg)  05/03/21 180 lb (81.6 kg)     Health Maintenance Due  Topic Date Due   Zoster Vaccines- Shingrix (1 of 2) Never done    There are no preventive care reminders to display for this patient.  Lab Results  Component Value Date   TSH 1.55 12/07/2020   Lab Results  Component Value Date   WBC 7.1 05/03/2021   HGB 13.9 05/03/2021   HCT 43.2 05/03/2021   MCV 99.3 05/03/2021   PLT 212.0 05/03/2021   Lab Results  Component Value Date   NA 136 05/03/2021   K 4.1 05/03/2021   CO2 28 05/03/2021   GLUCOSE 98 05/03/2021   BUN 13 05/03/2021   CREATININE 0.56 05/03/2021   BILITOT 0.5 05/03/2021   ALKPHOS 63 05/03/2021   AST 15 05/03/2021   ALT 12 05/03/2021   PROT 6.0 05/03/2021   ALBUMIN 3.8 05/03/2021   CALCIUM 8.8 05/03/2021    ANIONGAP 13 11/03/2018   GFR 89.37 05/03/2021   Lab Results  Component Value Date   CHOL 123 12/07/2020   Lab Results  Component Value Date   HDL 51.10 12/07/2020   Lab Results  Component Value Date   LDLCALC 56 12/07/2020   Lab Results  Component Value Date   TRIG 83.0 12/07/2020   Lab Results  Component Value Date   CHOLHDL 2 12/07/2020   Lab Results  Component Value Date   HGBA1C 5.1 12/07/2020      Assessment & Plan:   Problem List Items Addressed This Visit   None Visit Diagnoses     Chronic pain syndrome    -  Primary   Relevant Medications   oxyCODONE-acetaminophen (PERCOCET) 7.5-325 MG tablet   pregabalin (LYRICA) 25 MG capsule   Other Relevant Orders   Ambulatory referral to Pain Clinic       Meds ordered this encounter  Medications   oxyCODONE-acetaminophen (PERCOCET) 7.5-325 MG tablet    Sig: Take 1 tablet by mouth every 8 (eight) hours as needed for severe pain.    Dispense:  90 tablet    Refill:  0   pregabalin (LYRICA) 25 MG capsule    Sig: Take 1 capsule (25 mg total) by mouth 3 (three) times daily.    Dispense:  90 capsule    Refill:  1    Follow-up: Return if symptoms worsen or fail to improve.  Have increased oxycodone to 7.5 to be taken 3 times daily on an as-needed basis.  Have added low-dose Lyrica 25 3 times daily.  May continue other discussed therapies.  Have referred on to pain management.  Libby Maw, MD  Virtual Visit via Video Note  I connected with Brian [name] on 09/09/21 at  1:30 PM EST by a video  enabled telemedicine application and verified that I am speaking with the correct person using two identifiers.  Location: Patient: home with caregiver, Neoma Laming. Provider: work   I discussed the limitations of evaluation and management by telemedicine and the availability of in person appointments. The patient expressed understanding and agreed to proceed.  History of Present Illness:     Observations/Objective:   Assessment and Plan:   Follow Up Instructions:    I discussed the assessment and treatment plan with the patient. The patient was provided an opportunity to ask questions and all were answered. The patient agreed with the plan and demonstrated an understanding of the instructions.   The patient was advised to call back or seek an in-person evaluation if the symptoms worsen or if the condition fails to improve as anticipated.  I provided 30 minutes of non-face-to-face time during this encounter.   Libby Maw, MD

## 2021-09-13 ENCOUNTER — Ambulatory Visit: Payer: Medicare Other | Admitting: Physical Therapy

## 2021-09-13 ENCOUNTER — Other Ambulatory Visit: Payer: Self-pay

## 2021-09-13 DIAGNOSIS — M623 Immobility syndrome (paraplegic): Secondary | ICD-10-CM | POA: Diagnosis not present

## 2021-09-13 DIAGNOSIS — I69354 Hemiplegia and hemiparesis following cerebral infarction affecting left non-dominant side: Secondary | ICD-10-CM | POA: Diagnosis not present

## 2021-09-13 DIAGNOSIS — R2689 Other abnormalities of gait and mobility: Secondary | ICD-10-CM

## 2021-09-13 DIAGNOSIS — R29818 Other symptoms and signs involving the nervous system: Secondary | ICD-10-CM | POA: Diagnosis not present

## 2021-09-13 NOTE — Therapy (Signed)
[facility] [address] [place], [place], [address] Phone: [phone]   Fax:  [phone]  Physical Therapy Treatment  Patient Details  Name: Brian [name] MRN: [id number] Date of Birth: 18-Jun-1933 Referring Provider (PT): Libby Maw, MD   Encounter Date: 09/13/2021   PT End of Session - 09/13/21 1307     Visit Number 4    Number of Visits 7    Date for PT Re-Evaluation 10/22/21    Authorization Type Medicare    PT Start Time 1310    PT Stop Time 1350    PT Time Calculation (min) 40 min    Equipment Utilized During Treatment Gait belt    Activity Tolerance Patient tolerated treatment well    Behavior During Therapy WFL for tasks assessed/performed             Past Medical History:  Diagnosis Date   Anemia    Atrial flutter (HCC)    BPH (benign prostatic hypertrophy)    Epistaxis    Hemorrhage of gastrointestinal tract, unspecified    History of open heart surgery    Hypertension    Obesity (BMI 30.0-34.9) 06/29/2020   Osteoarthrosis, unspecified whether generalized or localized, unspecified site    Personal history of venous thrombosis and embolism    Rosacea    Stroke ([place])    TIA (transient ischemic attack)     Past Surgical History:  Procedure Laterality Date   APPENDECTOMY     HERNIA REPAIR     IR GENERIC HISTORICAL  05/04/2016   IR PERCUTANEOUS ART THROMBECTOMY/INFUSION INTRACRANIAL INC DIAG ANGIO 05/04/2016 [name], MD [facility]   IR GENERIC HISTORICAL  05/04/2016   IR ANGIO VERTEBRAL SEL SUBCLAVIAN INNOMINATE UNI R MOD SED 05/04/2016 [name], MD [facility]   IR GENERIC HISTORICAL  06/07/2016   IR RADIOLOGIST EVAL & MGMT 06/07/2016 [facility]   JOINT REPLACEMENT     MITRAL VALVE REPAIR     mvp repair     RADIOLOGY WITH ANESTHESIA N/A 05/04/2016   Procedure: RADIOLOGY WITH ANESTHESIA;  Surgeon: [name], MD;  Location: [facility];  Service:  Radiology;  Laterality: N/A;   TOTAL HIP ARTHROPLASTY      There were no vitals filed for this visit.   Subjective Assessment - 09/13/21 1317     Subjective Pt states he's been trying to stand at home. Pt reports he's mainly been trying to stand and has not performed his other exercises. States his knees go out.    Patient is accompained by: Family member   caregiver   Pertinent History h/o Rt CVA in Oct. 2017, HTN, hx of arthritis.    Limitations Standing;House hold activities;Sitting    Patient Stated Goals pt reports that he wants to walk (has not walked in about 4 years).    Currently in Pain? No/denies                [facility] PT Assessment - 09/13/21 0001       Assessment   Medical Diagnosis s/p chronic Rt CVA with Lt hemiparesis    Referring Provider (PT) Libby Maw, MD    Onset Date/Surgical Date 06/24/21    Hand Dominance Right                           [facility] Adult PT Treatment/Exercise - 09/13/21 0001       Transfers   Transfers Sit  to Stand    Sit to Stand 2: Max assist;With upper extremity assist;From bed    Sit to Stand Details Tactile cues for initiation;Tactile cues for sequencing;Tactile cues for weight shifting;Tactile cues for placement;Tactile cues for weight beaing;Manual facilitation for weight shifting;Manual facilitation for weight bearing;Verbal cues for sequencing    Lateral/Scoot Transfers 2: Max assist;3: Mod assist    Lateral/Scoot Transfer Details (indicate cue type and reason) with sliding board; multiple tactile and verbal cues for increased forward lean for lift off    Comments Lift off from chair x5; attempted lift off from mat table x5 pushing through UEs      Neuro Re-ed    Neuro Re-ed Details  Worked on unsupported sitting balance on side of mat; pt needs cues to sit erect and to hold head up, as he holds head down due to forward head posture. forward reach L & R UE x10 each      Knee/Hip Exercises: Aerobic    Other Aerobic Sci fit in w/c: L1 x 15 min LEs only maintaining >35 steps/min                       PT Short Term Goals - 09/13/21 1407       PT SHORT TERM GOAL #1   Title Patient will perform initial HEP with caregiver/son assist at home in order to address strength, ROM. ALL STGS DUE 09/13/21    Time 3    Period Weeks    Status Partially Met    Target Date 09/13/21               PT Long Term Goals - 08/23/21 1503       PT LONG TERM GOAL #1   Title Caregiver/son will correctly demonstrate HEP to assist pt with BLE AROM and strengthening as tolerated.    Time 6    Period Weeks    Status New    Target Date 10/04/21      PT LONG TERM GOAL #2   Title Pt will be able to perform seated balance at EOB with min/mod A for at least 2 minutes in order to demo improved seated tolerance/balance for IADLs/ADLs.    Baseline mod A    Time 6    Period Weeks    Status New      PT LONG TERM GOAL #3   Title Pt will perform sliding board vs. squat pivot transfer with mod A x1 from w/c <> mat table in order to demo improved functional mobility for transfers at home with son/caregiver.    Baseline pt being transferred dependently by son/caregiver.    Time 6    Period Weeks    Status New                   Plan - 09/13/21 1322     Clinical Impression Statement Continued to work on seated balance and transfers. Focus on pt pushing up to stand vs pulling up for lift off. Limited due to pt with increased c/o L knee pain this session. Pt is demonstrating improved sitting balance. Pt requiring CGA this session for static sitting. Requires tactile cueing to maintain trunk extension. Primarily worked in sitting this session with fair pt tolerance.    Personal Factors and Comorbidities Age;Behavior Pattern;Comorbidity 2;Time since onset of injury/illness/exacerbation;Fitness;Past/Current Experience;Transportation    Comorbidities PMH: HTN, R CVA (Oct 2017)     Examination-Activity Limitations Bathing;Lift;Stand;Bed Mobility;Locomotion Level;Toileting;Bend;Transfers;Squat;Stairs;Hygiene/Grooming;Sit;Carry;Dressing  Examination-Participation Restrictions Meal Prep;Community Activity;Laundry;Shop;Cleaning    Stability/Clinical Decision Making Evolving/Moderate complexity    Rehab Potential Fair   due to severity of deficits.   PT Frequency 1x / week    PT Duration 6 weeks    PT Treatment/Interventions ADLs/Self Care Home Management;Balance training;Neuromuscular re-education;Gait training;Patient/family education;Therapeutic exercise;Therapeutic activities;Functional mobility training;Passive range of motion;Manual techniques    PT Next Visit Plan Review previous HEP and add/update as appropriate. Sitting tolerance/balance -- work on transfers. Try using yoga blocks for pt to push up to stand. Perform SciFit from pt's w/c. Working on Veterinary surgeon transfers with goal to perform at home with caregiver/son.    PT [place] and Agree with Plan of Care Patient;Family member/caregiver   pt's caregiver   Family Member Consulted son, [name]             Patient will benefit from skilled therapeutic intervention in order to improve the following deficits and impairments:  Decreased balance, Decreased mobility, Decreased strength, Impaired UE functional use, Pain, Difficulty walking, Increased muscle spasms, Decreased activity tolerance, Decreased range of motion, Impaired flexibility, Postural dysfunction, Impaired sensation  Visit Diagnosis: Hemiplegia and hemiparesis following cerebral infarction affecting left non-dominant side (HCC)  Other symptoms and signs involving the nervous system  Other abnormalities of gait and mobility     Problem List Patient Active Problem List   Diagnosis Date Noted   Pressure injury of skin of sacral region 03/29/2021   Pressure ulcer of left buttock, stage 2 ([place]) 03/29/2021    Cellulitis of left upper extremity 02/08/2021   Pressure injury of left elbow, stage 1 02/08/2021   Other insomnia 02/08/2021   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   Obesity (BMI 30.0-34.9) 06/29/2020   Acute non-recurrent sinusitis 05/11/2020   Need for influenza vaccination 05/11/2020   Paroxysmal atrial fibrillation ([place]) 05/11/2020   Benign prostatic hyperplasia with urinary hesitancy 05/11/2020   Venous ulcer, limited to breakdown of skin ([place]) 02/06/2020   Swelling of lower extremity 02/06/2020   Noise effect on both inner ears 08/12/2019   Presbycusis of both ears 08/12/2019   Left ear pain 05/20/2019   Pressure injury of right buttock, stage 1 05/20/2019   Debilitated 05/20/2019   Elevated glucose 04/08/2019   Osteoarthritis of right knee 03/25/2019   Serum potassium elevated 03/18/2019   Hyperlipidemia 09/24/2018   Gait disturbance 09/24/2018   Anxiety 04/30/2018   Lumbar radiculopathy 02/14/2018   Pain in joint of left shoulder 12/28/2017   Osteoarthritis of left glenohumeral joint 12/28/2017   Pain in left foot 10/23/2017   History of cerebrovascular accident 10/18/2017   Coronary artery disease involving native coronary artery of native heart without angina pectoris    Persistent atrial fibrillation (HCC)    Dysarthria, post-stroke    Dysphagia, post-stroke    Cerebrovascular accident (CVA) due to thrombosis of precerebral artery ([place])    Scoliosis (and kyphoscoliosis), idiopathic 01/26/2014   Arthralgia of hip 02/07/2012   Osteoarthritis of hip 02/07/2012   Sacroiliitis ([place]) 02/07/2012   ROTATOR CUFF SYNDROME, LEFT 03/07/2010   OTH MALIG NEOPLASM SKIN OTH&UNSPEC PARTS FACE 12/06/2009   ACTINIC KERATOSIS, HEAD 09/06/2009   Osteoarthritis 02/20/2009   History of mitral valve repair 02/20/2009   HIP REPLACEMENT, TOTAL, HX OF 02/20/2009   APPENDECTOMY, HX OF 02/20/2009   HERNIORRHAPHY, HX OF 02/20/2009   EPISTAXIS, RECURRENT 11/19/2008   UNS ADVRS EFF  UNS RX MEDICINAL&BIOLOGICAL SBSTNC 06/12/2008   OTHER AND UNSPECIFIED MITRAL VALVE  DISEASES 03/12/2008   ATRIAL FLUTTER 03/12/2008   Essential hypertension 03/06/2007   PEPTIC ULCER DISEASE 03/06/2007   BENIGN PROSTATIC HYPERTROPHY 03/06/2007   Coronary atherosclerosis 02/13/2007   ROSACEA 02/13/2007   DVT, HX OF 02/13/2007    [facility] [address] [place], [place], DPT 09/13/2021, 2:07 PM  [facility] [address] [place], [place], [address] Phone: [phone]   Fax:  [phone]  Name: ZENAS SANTA MRN: [id number] Date of Birth: Nov 28, 1932

## 2021-09-14 ENCOUNTER — Telehealth: Payer: Self-pay | Admitting: Family Medicine

## 2021-09-14 ENCOUNTER — Telehealth: Payer: Self-pay

## 2021-09-14 DIAGNOSIS — J301 Allergic rhinitis due to pollen: Secondary | ICD-10-CM

## 2021-09-14 NOTE — Progress Notes (Signed)
Chronic Care Management APPOINTMENT REMINDER  Brian [name] was reminded to have all medications, supplements and any blood glucose and blood pressure readings available for review with Brian [name], Pharm. D, at his telephone visit on 09/15/2021 at 1:00 pm.   Patient Confirm appointment.  [place] Pharmacist Assistant [phone]

## 2021-09-15 ENCOUNTER — Ambulatory Visit (INDEPENDENT_AMBULATORY_CARE_PROVIDER_SITE_OTHER): Payer: Medicare Other

## 2021-09-15 DIAGNOSIS — G894 Chronic pain syndrome: Secondary | ICD-10-CM

## 2021-09-15 DIAGNOSIS — I10 Essential (primary) hypertension: Secondary | ICD-10-CM

## 2021-09-15 NOTE — Progress Notes (Signed)
Chronic Care Management Pharmacy Note  09/16/2021 Name:  Brian [name] MRN:  [id number] DOB:  1933-02-07  Summary: Patient presents for CCM follow-up. -Patient reports no relief from recent pain medications. Taking Lyrica and oxycodone twice daily. He denies symptoms of oversedation or other signs of CNS depression.  -Patient is interested in increasing PT to twice weekly with home PT if possible.  -Patient has not heard about pain management referral.  Recommendations/Changes made from today's visit: Continue current medications  Plan: CPP follow-up in 9 months  Subjective: Brian [name] is an 88 y.o. year old male who is a primary patient of Ethelene Hal, Mortimer Fries, MD.  The CCM team was consulted for assistance with disease management and care coordination needs.    Engaged with patient by telephone for follow up visit in response to provider referral for pharmacy case management and/or care coordination services.   Consent to Services:  The patient was given information about Chronic Care Management services, agreed to services, and gave verbal consent prior to initiation of services.  Please see initial visit note for detailed documentation.   Patient Care Team: Libby Maw, MD as PCP - General (Family Medicine) Skeet Latch, MD as PCP - Cardiology (Cardiology) Germaine Pomfret, [facility] as Pharmacist (Pharmacist)  Recent office visits: 09/09/21: Patient presented to Dr. Ethelene Hal for chronic pain. Pregabalin started. Oxycodone increased to 7.75m.  08/02/21: Patient presented to Dr. KEthelene Halfor follow-up.   Recent consult visits: 06/21/21: Patient presented to Dr. ROval Linsey(Cardiology) for follow-up.   Hospital visits: None in previous 6 months   Objective:  Lab Results  Component Value Date   CREATININE 0.56 05/03/2021   BUN 13 05/03/2021   GFR 89.37 05/03/2021   GFRNONAA >60 11/03/2018   GFRAA >60 11/03/2018   NA 136 05/03/2021   K 4.1 05/03/2021    CALCIUM 8.8 05/03/2021   CO2 28 05/03/2021   GLUCOSE 98 05/03/2021    Lab Results  Component Value Date/Time   HGBA1C 5.1 12/07/2020 03:11 PM   HGBA1C 4.9 04/08/2019 02:25 PM   GFR 89.37 05/03/2021 02:02 PM   GFR 94.49 12/07/2020 03:11 PM    Last diabetic Eye exam: No results found for: HMDIABEYEEXA  Last diabetic Foot exam: No results found for: HMDIABFOOTEX   Lab Results  Component Value Date   CHOL 123 12/07/2020   HDL 51.10 12/07/2020   LDLCALC 56 12/07/2020   LDLDIRECT 68 02/06/2020   TRIG 83.0 12/07/2020   CHOLHDL 2 12/07/2020    Hepatic Function Latest Ref Rng & Units 05/03/2021 12/07/2020 05/11/2020  Total Protein 6.0 - 8.3 g/dL 6.0 5.9(L) 6.6  Albumin 3.5 - 5.2 g/dL 3.8 3.7 4.1  AST 0 - 37 U/L '15 18 21  ' ALT 0 - 53 U/L '12 23 17  ' Alk Phosphatase 39 - 117 U/L 63 59 78  Total Bilirubin 0.2 - 1.2 mg/dL 0.5 0.5 0.5  Bilirubin, Direct 0.0 - 0.3 mg/dL - - -    Lab Results  Component Value Date/Time   TSH 1.55 12/07/2020 03:11 PM   TSH 2.99 09/26/2016 09:50 AM   FREET4 1.19 05/15/2011 09:25 AM    CBC Latest Ref Rng & Units 05/03/2021 12/07/2020 11/06/2019  WBC 4.0 - 10.5 K/uL 7.1 8.2 8.4  Hemoglobin 13.0 - 17.0 g/dL 13.9 15.3 15.1  Hematocrit 39.0 - 52.0 % 43.2 45.3 44.9  Platelets 150.0 - 400.0 K/uL 212.0 205.0 241.0    No results found for: VD25OH  Clinical ASCVD: Yes  The ASCVD Risk score (Arnett DK, et al., 2019) failed to calculate for the following reasons:   The 2019 ASCVD risk score is only valid for ages 20 to 46   The patient has a prior MI or stroke diagnosis    Depression screen [facility] 2/9 09/09/2021 08/02/2021 03/29/2021  Decreased Interest 0 0 0  Down, Depressed, Hopeless 0 0 0  PHQ - 2 Score 0 0 0  Some recent data might be hidden    Social History   Tobacco Use  Smoking Status Former  Smokeless Tobacco Never  Tobacco Comments   quit 50 yr ago   BP Readings from Last 3 Encounters:  08/02/21 115/66  06/21/21 118/62  05/03/21 120/62    Pulse Readings from Last 3 Encounters:  08/02/21 61  06/21/21 (!) 58  05/03/21 68   Wt Readings from Last 3 Encounters:  08/02/21 178 lb (80.7 kg)  06/21/21 178 lb 12.8 oz (81.1 kg)  05/03/21 180 lb (81.6 kg)   BMI Readings from Last 3 Encounters:  09/09/21 28.73 kg/m  08/02/21 28.73 kg/m  06/21/21 28.86 kg/m    Assessment/Interventions: Review of patient past medical history, allergies, medications, health status, including review of consultants reports, laboratory and other test data, was performed as part of comprehensive evaluation and provision of chronic care management services.   SDOH:  (Social Determinants of Health) assessments and interventions performed: Yes SDOH Interventions    Flowsheet Row Most Recent Value  SDOH Interventions   Financial Strain Interventions Intervention Not Indicated      SDOH Screenings   Alcohol Screen: Not on file  Depression (PHQ2-9): Low Risk    PHQ-2 Score: 0  Financial Resource Strain: Low Risk    Difficulty of Paying Living Expenses: Not hard at all  Food Insecurity: Not on file  Housing: Not on file  Physical Activity: Not on file  Social Connections: Not on file  Stress: Not on file  Tobacco Use: Medium Risk   Smoking Tobacco Use: Former   Smokeless Tobacco Use: Never   Passive Exposure: Not on file  Transportation Needs: Not on file    [place]  Allergies  Allergen Reactions   Novocain [Procaine Hcl]     Irregular heart beat      Other     Patient had problem with epidural with his right hip surgery. Went to up extremity instead of lower extremity    Penicillins Itching    Has patient had a PCN reaction causing immediate rash, facial/tongue/throat swelling, SOB or lightheadedness with hypotension: Unk Has patient had a PCN reaction causing severe rash involving mucus membranes or skin necrosis: Unk Has patient had a PCN reaction that required hospitalization: Unk Has patient had a PCN reaction  occurring within the last 10 years: No If all of the above answers are "NO", then may proceed with Cephalosporin use.  No reaction noted    Medications Reviewed Today     Reviewed by Germaine Pomfret, [facility] (Pharmacist) on 09/16/21 at 1001  Med List Status: <None>   Medication Order Taking? Sig Documenting Provider Last Dose Status Informant  acetaminophen (TYLENOL) 500 MG tablet [id number] No Take 500 mg by mouth every 6 (six) hours as needed. [provider] Taking Active   apixaban (ELIQUIS) 2.5 MG TABS tablet [id number] No TAKE 1 TABLET(2.5 MG) BY MOUTH TWICE DAILY Libby Maw, MD Taking Active   cetirizine (ZYRTEC) 10 MG tablet [id number] No TAKE 1 TABLET(10 MG) BY MOUTH AT BEDTIME  Libby Maw, MD Taking Active   CMC-Calcium Alginate-Silver (TEGADERM ALGINATE AG DRESSING) 4" X 5" PADS [id number] No Apply to sore on back side of left elbow once weekly. Libby Maw, MD Taking Active   finasteride (PROSCAR) 5 MG tablet [id number] No TAKE 1 TABLET(5 MG) BY MOUTH DAILY Libby Maw, MD Taking Active   hydrocortisone [facility]) 25 MG suppository [id number] No UNWRAP AND INSERT 1 SUPPOSITORY RECTALLY TWICE DAILY Ladene Artist, MD Taking Active   hydrocortisone (PROCTOZONE-HC) 2.5 % rectal cream [id number] No USE RECTALLY TWICE DAILY Ladene Artist, MD Taking Active   hydrocortisone-pramoxine [facility]) 2.5-1 % rectal cream [id number] No Place 1 application rectally 2 (two) times daily as needed for hemorrhoids.  [provider] Taking Active Family Member  losartan (COZAAR) 25 MG tablet [id number] No TAKE 1 TABLET(25 MG) BY MOUTH DAILY Skeet Latch, MD Taking Active   metoprolol succinate (TOPROL-XL) 25 MG 24 hr tablet [id number] No TAKE 1 TABLET(25 MG) BY MOUTH DAILY Libby Maw, MD Taking Active   Multiple Vitamin (MULTIVITAMIN) tablet [id number] No Take 2 tablets by mouth daily. [provider] Taking Active    oxyCODONE-acetaminophen (PERCOCET) 7.5-325 MG tablet [id number]  Take 1 tablet by mouth every 8 (eight) hours as needed for severe pain.  Patient taking differently: Take 1 tablet by mouth in the morning and at bedtime.   Libby Maw, MD  Active   pantoprazole (PROTONIX) 40 MG tablet [id number] No TAKE 1 TABLET(40 MG) BY MOUTH DAILY Libby Maw, MD Taking Active   polyethylene glycol powder [facility]) 17 GM/SCOOP powder [id number] No Mix one scoop with water and take daily until stools loosen. May repeat as needed. Libby Maw, MD Taking Active   pravastatin (PRAVACHOL) 40 MG tablet [id number] No TAKE 1 TABLET(40 MG) BY MOUTH DAILY Libby Maw, MD Taking Active   pregabalin (LYRICA) 25 MG capsule [id number]  Take 1 capsule (25 mg total) by mouth 3 (three) times daily.  Patient taking differently: Take 25 mg by mouth 2 (two) times daily.   Libby Maw, MD  Active   QUEtiapine (SEROQUEL) 50 MG tablet [id number] No TAKE 1 TABLET(50 MG) BY MOUTH AT BEDTIME Libby Maw, MD Taking Active   sertraline (ZOLOFT) 50 MG tablet [id number] No TAKE 1 TABLET(50 MG) BY MOUTH EVERY MORNING Strength: 50 mg Libby Maw, MD Taking Active   tamsulosin [facility]) 0.4 MG CAPS capsule [id number] No TAKE 1 CAPSULE(0.4 MG) BY MOUTH DAILY Libby Maw, MD Taking Active             Patient Active Problem List   Diagnosis Date Noted   Pressure injury of skin of sacral region 03/29/2021   Pressure ulcer of left buttock, stage 2 ([place]) 03/29/2021   Cellulitis of left upper extremity 02/08/2021   Pressure injury of left elbow, stage 1 02/08/2021   Other insomnia 02/08/2021   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   Obesity (BMI 30.0-34.9) 06/29/2020   Acute non-recurrent sinusitis 05/11/2020   Need for influenza vaccination 05/11/2020   Paroxysmal atrial fibrillation ([place]) 05/11/2020   Benign prostatic hyperplasia with urinary  hesitancy 05/11/2020   Venous ulcer, limited to breakdown of skin ([place]) 02/06/2020   Swelling of lower extremity 02/06/2020   Noise effect on both inner ears 08/12/2019   Presbycusis of both ears 08/12/2019   Left ear pain 05/20/2019   Pressure injury of right buttock, stage 1 05/20/2019   Debilitated 05/20/2019   Elevated glucose 04/08/2019  Osteoarthritis of right knee 03/25/2019   Serum potassium elevated 03/18/2019   Hyperlipidemia 09/24/2018   Gait disturbance 09/24/2018   Anxiety 04/30/2018   Lumbar radiculopathy 02/14/2018   Pain in joint of left shoulder 12/28/2017   Osteoarthritis of left glenohumeral joint 12/28/2017   Pain in left foot 10/23/2017   History of cerebrovascular accident 10/18/2017   Coronary artery disease involving native coronary artery of native heart without angina pectoris    Persistent atrial fibrillation (HCC)    Dysarthria, post-stroke    Dysphagia, post-stroke    Cerebrovascular accident (CVA) due to thrombosis of precerebral artery ([place])    Scoliosis (and kyphoscoliosis), idiopathic 01/26/2014   Arthralgia of hip 02/07/2012   Osteoarthritis of hip 02/07/2012   Sacroiliitis ([place]) 02/07/2012   ROTATOR CUFF SYNDROME, LEFT 03/07/2010   OTH MALIG NEOPLASM SKIN OTH&UNSPEC PARTS FACE 12/06/2009   ACTINIC KERATOSIS, HEAD 09/06/2009   Osteoarthritis 02/20/2009   History of mitral valve repair 02/20/2009   HIP REPLACEMENT, TOTAL, HX OF 02/20/2009   APPENDECTOMY, HX OF 02/20/2009   HERNIORRHAPHY, HX OF 02/20/2009   EPISTAXIS, RECURRENT 11/19/2008   UNS ADVRS EFF UNS RX MEDICINAL&BIOLOGICAL SBSTNC 06/12/2008   OTHER AND UNSPECIFIED MITRAL VALVE DISEASES 03/12/2008   ATRIAL FLUTTER 03/12/2008   Essential hypertension 03/06/2007   PEPTIC ULCER DISEASE 03/06/2007   BENIGN PROSTATIC HYPERTROPHY 03/06/2007   Coronary atherosclerosis 02/13/2007   ROSACEA 02/13/2007   DVT, HX OF 02/13/2007    Immunization History  Administered Date(s) Administered    Fluad Quad(high Dose 65+) 05/11/2020   H1N1 09/01/2008   Influenza Inj Mdck Quad Pf 05/27/2018   Influenza Split 05/15/2011   Influenza Whole 04/26/2009, 04/25/2013   Influenza, High Dose Seasonal PF 05/17/2017, 05/12/2019, 05/12/2019   Influenza,inj,Quad PF,6+ Mos 05/10/2016   Influenza-Unspecified 05/03/2014, 05/12/2015, 06/14/2017, 03/29/2021   PFIZER(Purple Top)SARS-COV-2 Vaccination 11/20/2019, 12/19/2019, 05/30/2020   Pneumococcal Conjugate-13 07/21/2013   Pneumococcal Polysaccharide-23 09/04/2007   Td 09/04/2007, 03/12/2008   Tdap 05/03/2014   Zoster, Live 01/27/2011    Conditions to be addressed/monitored:  Hypertension, Hyperlipidemia, and Atrial Fibrillation  Care Plan : General Pharmacy (Adult)  Updates made by Germaine Pomfret, RPH since 09/16/2021 12:00 AM     Problem: Hypertension, Hyperlipidemia, and Atrial Fibrillation   Priority: High     Long-Range Goal: Patient-Specific Goal   Start Date: 03/28/2021  Expected End Date: 03/28/2022  This Visit's Progress: On track  Recent Progress: On track  Priority: High  Note:   Current Barriers:  No Barriers Noted  Pharmacist Clinical Goal(s):  Patient will maintain control of blood pressure as evidenced by BP less than 140/90  through collaboration with PharmD and provider.   Interventions: 1:1 collaboration with Libby Maw, MD regarding development and update of comprehensive plan of care as evidenced by provider attestation and co-signature Inter-disciplinary care team collaboration (see longitudinal plan of care) Comprehensive medication review performed; medication list updated in electronic medical record  Hypertension (BP goal <140/90) -Controlled -Current treatment: Losartan 25 mg daily: Appropriate, Effective, Safe, Accessible Metoprolol XL 25 mg daily: Appropriate, Effective, Safe, Accessible  -Medications previously tried: NA  -Current home readings: NA -Denies hypotensive/hypertensive  symptoms -Educated on Daily salt intake goal < 2300 mg; -Counseled to monitor BP at home weekly, document, and provide log at future appointments -Recommended to continue current medication  Hyperlipidemia: (LDL goal < 100) -Controlled -Current treatment: Pravastatin 40 mg daily  -Medications previously tried: NA  -Recommended to continue current medication  Atrial Fibrillation (Goal: prevent stroke and major  bleeding) -Controlled -Current treatment: Rate control: Metoprolol XL 25 mg daily  Anticoagulation: Eliquis 2.5 mg twice daily -Medications previously tried: NA -Recommended to continue current medication  Chtronic Pain (Goal: Minimize pain symptoms) -Uncontrolled -Current treatment  Pregabalin 25 mg twice daily: Appropriate, Query effective  Oxycodone 7.5-325 mg twice daily: Appropriate, Query effective   -Medications previously tried: NA  -Confirmed with patient daughter [name] that new oxycodone and pregabalin prescriptions were picked up from pharmacy and placed in pillbox twice daily.  -Daughter and grandson Quita Skye had concerns with strength and frequency of pain medication  -Patient has not heard about pain management referral. -Patient reports no relief from recent pain medications. He denies symptoms of oversedation or other signs of CNS depression.  -Patient is interested in increasing PT to twice weekly with home PT if possible.  -Recommended to continue current medication  Patient Goals/Self-Care Activities Patient will:  - check blood pressure weekly, document, and provide at future appointments  Follow Up Plan: Telephone follow up appointment with care management team member scheduled for:  06/22/2022 at 3:45 PM    Medication Assistance: None required.  Patient affirms current coverage meets needs.  Compliance/Adherence/Medication fill history: Care Gaps: Shingrix  Covid Vaccine Influenza   Star-Rating Drugs: Losartan 25 mg daily 03/11/21 for 30-DS    Patient's preferred pharmacy is:  [facility] [place], [address] [place] AT [facility] [address] [address] Phone: [phone] Fax: [phone]  [facility] [place], [address] [place] [address] Phone: [phone] Fax: [phone]  [id number] - [place], [address] AT [address] [place] [address] Phone: [phone] Fax: [id number] [place], [place] - [address] [address] Phone: [phone] Fax: [phone]   Uses pill box? Yes Pt endorses 100% compliance  We discussed: Current pharmacy is preferred with insurance plan and patient is satisfied with pharmacy services Patient decided to: Continue current medication management strategy  Care Plan and Follow Up Patient Decision:  Patient agrees to Care Plan and Follow-up.  Plan: Telephone follow up appointment with care management team member scheduled for:  06/22/2022 at 3:45 PM  Junius Argyle, PharmD, Para March, CPP Clinical Pharmacist Practitioner  [facility] at [facility]  [phone]

## 2021-09-16 MED ORDER — CETIRIZINE HCL 10 MG PO TABS: ORAL_TABLET | ORAL | 2 refills | Status: DC

## 2021-09-16 NOTE — Telephone Encounter (Signed)
Patient would like to extend PT per patient this will expire soon. Please advise okay to extend?

## 2021-09-16 NOTE — Patient Instructions (Signed)
Visit Information It was great speaking with you today!  Please let me know if you have any questions about our visit.   Goals Addressed             This Visit's Progress    Track and Manage My Blood Pressure-Hypertension   On track    Timeframe:  Long-Range Goal Priority:  High Start Date:  03/28/2021                            Expected End Date: 03/28/2022                      Follow Up within 90 days   - check blood pressure weekly    Why is this important?   You won't feel high blood pressure, but it can still hurt your blood vessels.  High blood pressure can cause heart or kidney problems. It can also cause a stroke.  Making lifestyle changes like losing a little weight or eating less salt will help.  Checking your blood pressure at home and at different times of the day can help to control blood pressure.  If the doctor prescribes medicine remember to take it the way the doctor ordered.  Call the office if you cannot afford the medicine or if there are questions about it.     Notes:         Patient Care Plan: General Pharmacy (Adult)     Problem Identified: Hypertension, Hyperlipidemia, and Atrial Fibrillation   Priority: High     Long-Range Goal: Patient-Specific Goal   Start Date: 03/28/2021  Expected End Date: 03/28/2022  This Visit's Progress: On track  Recent Progress: On track  Priority: High  Note:   Current Barriers:  No Barriers Noted  Pharmacist Clinical Goal(s):  Patient will maintain control of blood pressure as evidenced by BP less than 140/90  through collaboration with PharmD and provider.   Interventions: 1:1 collaboration with Libby Maw, MD regarding development and update of comprehensive plan of care as evidenced by provider attestation and co-signature Inter-disciplinary care team collaboration (see longitudinal plan of care) Comprehensive medication review performed; medication list updated in electronic medical  record  Hypertension (BP goal <140/90) -Controlled -Current treatment: Losartan 25 mg daily: Appropriate, Effective, Safe, Accessible Metoprolol XL 25 mg daily: Appropriate, Effective, Safe, Accessible  -Medications previously tried: NA  -Current home readings: NA -Denies hypotensive/hypertensive symptoms -Educated on Daily salt intake goal < 2300 mg; -Counseled to monitor BP at home weekly, document, and provide log at future appointments -Recommended to continue current medication  Hyperlipidemia: (LDL goal < 100) -Controlled -Current treatment: Pravastatin 40 mg daily  -Medications previously tried: NA  -Recommended to continue current medication  Atrial Fibrillation (Goal: prevent stroke and major bleeding) -Controlled -Current treatment: Rate control: Metoprolol XL 25 mg daily  Anticoagulation: Eliquis 2.5 mg twice daily -Medications previously tried: NA -Recommended to continue current medication  Chtronic Pain (Goal: Minimize pain symptoms) -Uncontrolled -Current treatment  Pregabalin 25 mg twice daily: Appropriate, Query effective  Oxycodone 7.5-325 mg twice daily: Appropriate, Query effective   -Medications previously tried: NA  -Confirmed with patient daughter Brian [name] that new oxycodone and pregabalin prescriptions were picked up from pharmacy and placed in pillbox twice daily.  -Daughter and grandson Brian [name] had concerns with strength and frequency of pain medication  -Patient has not heard about pain management referral. -Patient reports no relief from recent pain medications.  He denies symptoms of oversedation or other signs of CNS depression.  -Patient is interested in increasing PT to twice weekly with home PT if possible.  -Recommended to continue current medication  Patient Goals/Self-Care Activities Patient will:  - check blood pressure weekly, document, and provide at future appointments  Follow Up Plan: Telephone follow up appointment with care  management team member scheduled for:  06/22/2022 at 3:45 PM      Patient agreed to services and verbal consent obtained.   Patient verbalizes understanding of instructions and care plan provided today and agrees to view in [place]. Active MyChart status confirmed with patient.    Brian [name], PharmD, Para March, CPP Clinical Pharmacist Practitioner  [facility] at [facility]  [phone]

## 2021-09-20 ENCOUNTER — Other Ambulatory Visit: Payer: Self-pay

## 2021-09-20 ENCOUNTER — Ambulatory Visit: Payer: Medicare Other | Admitting: Physical Therapy

## 2021-09-20 DIAGNOSIS — I4891 Unspecified atrial fibrillation: Secondary | ICD-10-CM

## 2021-09-20 DIAGNOSIS — E785 Hyperlipidemia, unspecified: Secondary | ICD-10-CM

## 2021-09-20 DIAGNOSIS — M623 Immobility syndrome (paraplegic): Secondary | ICD-10-CM | POA: Diagnosis not present

## 2021-09-20 DIAGNOSIS — R2689 Other abnormalities of gait and mobility: Secondary | ICD-10-CM | POA: Diagnosis not present

## 2021-09-20 DIAGNOSIS — I10 Essential (primary) hypertension: Secondary | ICD-10-CM | POA: Diagnosis not present

## 2021-09-20 DIAGNOSIS — I69354 Hemiplegia and hemiparesis following cerebral infarction affecting left non-dominant side: Secondary | ICD-10-CM

## 2021-09-20 DIAGNOSIS — R29818 Other symptoms and signs involving the nervous system: Secondary | ICD-10-CM

## 2021-09-22 ENCOUNTER — Telehealth: Payer: Self-pay | Admitting: Family Medicine

## 2021-09-22 NOTE — Telephone Encounter (Signed)
Pt was in here recently and was not able to give a urine sample. He is wanting to know if he can come up here and get a urine cup to take home. Please advise pt at [phone]

## 2021-09-22 NOTE — Therapy (Signed)
[facility] [address] [place], [place], [address] Phone: [phone]   Fax:  [phone]  Physical Therapy Treatment  Patient Details  Name: Brian [name] MRN: [id number] Date of Birth: April 20, 1933 Referring Provider (PT): Libby Maw, MD   Encounter Date: 09/20/2021    Past Medical History:  Diagnosis Date   Anemia    Atrial flutter (HCC)    BPH (benign prostatic hypertrophy)    Epistaxis    Hemorrhage of gastrointestinal tract, unspecified    History of open heart surgery    Hypertension    Obesity (BMI 30.0-34.9) 06/29/2020   Osteoarthrosis, unspecified whether generalized or localized, unspecified site    Personal history of venous thrombosis and embolism    Rosacea    Stroke ([place])    TIA (transient ischemic attack)     Past Surgical History:  Procedure Laterality Date   APPENDECTOMY     HERNIA REPAIR     IR GENERIC HISTORICAL  05/04/2016   IR PERCUTANEOUS ART THROMBECTOMY/INFUSION INTRACRANIAL INC DIAG ANGIO 05/04/2016 [name], MD [facility]   IR GENERIC HISTORICAL  05/04/2016   IR ANGIO VERTEBRAL SEL SUBCLAVIAN INNOMINATE UNI R MOD SED 05/04/2016 [name], MD [facility]   IR GENERIC HISTORICAL  06/07/2016   IR RADIOLOGIST EVAL & MGMT 06/07/2016 [facility]   JOINT REPLACEMENT     MITRAL VALVE REPAIR     mvp repair     RADIOLOGY WITH ANESTHESIA N/A 05/04/2016   Procedure: RADIOLOGY WITH ANESTHESIA;  Surgeon: [name], MD;  Location: [facility];  Service: Radiology;  Laterality: N/A;   TOTAL HIP ARTHROPLASTY      There were no vitals filed for this visit.   Subjective Assessment - 09/22/21 1334     Subjective Pt reports he can't use the sliding board today due to a bed sore. Pt states he hasn't been able to exercise his legs. He states he wasn't able to sit up in his w/c for 30 min yesterday like he normally does due to fatigue. He also notes a change in his  medication -- caregiver states pt was given more pain medicine today.    Patient is accompained by: Family member   caregiver   Pertinent History h/o Rt CVA in Oct. 2017, HTN, hx of arthritis.    Limitations Standing;House hold activities;Sitting    Patient Stated Goals pt reports that he wants to walk (has not walked in about 4 years).                               [facility] PT Treatment/Exercise - 09/22/21 0001       Transfers   Transfers Sit to Stand    Sit to Stand With upper extremity assist;From bed;2: Max assist    Sit to Stand Details --   Pt only assisting with forward lean; otherwise difficulty pushing or pulling with hands and weight bearing/pushing through bilat feet   Sit to Stand Details (indicate cue type and reason) Using standing frame initially x 4 attempts; with Stedy 3x10-20 sec each   poor tolerance, reporting increased L knee pain   Lateral/Scoot Transfers --   unable due to new bed sore   Comments forward lean x 5, trial for lift off without equipment but pt not providing enough assist      Knee/Hip Exercises: Aerobic   Other Aerobic Sci fit in w/c: L1 x 15  min LEs only maintaining >35 steps/min                       PT Short Term Goals - 09/13/21 1407       PT SHORT TERM GOAL #1   Title Patient will perform initial HEP with caregiver/son assist at home in order to address strength, ROM. ALL STGS DUE 09/13/21    Time 3    Period Weeks    Status Partially Met    Target Date 09/13/21               PT Long Term Goals - 08/23/21 1503       PT LONG TERM GOAL #1   Title Caregiver/son will correctly demonstrate HEP to assist pt with BLE AROM and strengthening as tolerated.    Time 6    Period Weeks    Status New    Target Date 10/04/21      PT LONG TERM GOAL #2   Title Pt will be able to perform seated balance at EOB with min/mod A for at least 2 minutes in order to demo improved seated tolerance/balance for  IADLs/ADLs.    Baseline mod A    Time 6    Period Weeks    Status New      PT LONG TERM GOAL #3   Title Pt will perform sliding board vs. squat pivot transfer with mod A x1 from w/c <> mat table in order to demo improved functional mobility for transfers at home with son/caregiver.    Baseline pt being transferred dependently by son/caregiver.    Time 6    Period Weeks    Status New                   Plan - 09/22/21 1334     Clinical Impression Statement Very limited session today. Unable to use sliding board because pt notes an open bed sore. Continued to work on pushing vs pulling up for lift off with sit<>stands; however, pt appears more groggy/low energy with less participation. Trialed sit to stand from standing frame and stedy but limited due to his left knee pain.    Personal Factors and Comorbidities Age;Behavior Pattern;Comorbidity 2;Time since onset of injury/illness/exacerbation;Fitness;Past/Current Experience;Transportation    Comorbidities PMH: HTN, R CVA (Oct 2017)    Examination-Activity Limitations Bathing;Lift;Stand;Bed Mobility;Locomotion Level;Toileting;Bend;Transfers;Squat;Stairs;Hygiene/Grooming;Sit;Carry;Dressing    Examination-Participation Restrictions Meal Prep;Community Activity;Laundry;Shop;Cleaning    Stability/Clinical Decision Making Evolving/Moderate complexity    Rehab Potential Fair   due to severity of deficits.   PT Frequency 1x / week    PT Duration 6 weeks    PT Treatment/Interventions ADLs/Self Care Home Management;Balance training;Neuromuscular re-education;Gait training;Patient/family education;Therapeutic exercise;Therapeutic activities;Functional mobility training;Passive range of motion;Manual techniques    PT Next Visit Plan Review previous HEP and add/update as appropriate. Sitting tolerance/balance -- work on transfers. Try using yoga blocks for pt to push up to stand. Perform SciFit from pt's w/c. Working on Veterinary surgeon transfers  with goal to perform at home with caregiver/son.    PT [place] and Agree with Plan of Care Patient;Family member/caregiver   pt's caregiver   Family Member Consulted son, [name]             Patient will benefit from skilled therapeutic intervention in order to improve the following deficits and impairments:  Decreased balance, Decreased mobility, Decreased strength, Impaired UE functional use, Pain, Difficulty walking, Increased muscle  spasms, Decreased activity tolerance, Decreased range of motion, Impaired flexibility, Postural dysfunction, Impaired sensation  Visit Diagnosis: Hemiplegia and hemiparesis following cerebral infarction affecting left non-dominant side (HCC)  Other symptoms and signs involving the nervous system  Other abnormalities of gait and mobility     Problem List Patient Active Problem List   Diagnosis Date Noted   Pressure injury of skin of sacral region 03/29/2021   Pressure ulcer of left buttock, stage 2 ([place]) 03/29/2021   Cellulitis of left upper extremity 02/08/2021   Pressure injury of left elbow, stage 1 02/08/2021   Other insomnia 02/08/2021   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   Obesity (BMI 30.0-34.9) 06/29/2020   Acute non-recurrent sinusitis 05/11/2020   Need for influenza vaccination 05/11/2020   Paroxysmal atrial fibrillation ([place]) 05/11/2020   Benign prostatic hyperplasia with urinary hesitancy 05/11/2020   Venous ulcer, limited to breakdown of skin ([place]) 02/06/2020   Swelling of lower extremity 02/06/2020   Noise effect on both inner ears 08/12/2019   Presbycusis of both ears 08/12/2019   Left ear pain 05/20/2019   Pressure injury of right buttock, stage 1 05/20/2019   Debilitated 05/20/2019   Elevated glucose 04/08/2019   Osteoarthritis of right knee 03/25/2019   Serum potassium elevated 03/18/2019   Hyperlipidemia 09/24/2018   Gait disturbance 09/24/2018   Anxiety 04/30/2018    Lumbar radiculopathy 02/14/2018   Pain in joint of left shoulder 12/28/2017   Osteoarthritis of left glenohumeral joint 12/28/2017   Pain in left foot 10/23/2017   History of cerebrovascular accident 10/18/2017   Coronary artery disease involving native coronary artery of native heart without angina pectoris    Persistent atrial fibrillation (HCC)    Dysarthria, post-stroke    Dysphagia, post-stroke    Cerebrovascular accident (CVA) due to thrombosis of precerebral artery ([place])    Scoliosis (and kyphoscoliosis), idiopathic 01/26/2014   Arthralgia of hip 02/07/2012   Osteoarthritis of hip 02/07/2012   Sacroiliitis ([place]) 02/07/2012   ROTATOR CUFF SYNDROME, LEFT 03/07/2010   OTH MALIG NEOPLASM SKIN OTH&UNSPEC PARTS FACE 12/06/2009   ACTINIC KERATOSIS, HEAD 09/06/2009   Osteoarthritis 02/20/2009   History of mitral valve repair 02/20/2009   HIP REPLACEMENT, TOTAL, HX OF 02/20/2009   APPENDECTOMY, HX OF 02/20/2009   HERNIORRHAPHY, HX OF 02/20/2009   EPISTAXIS, RECURRENT 11/19/2008   UNS ADVRS EFF UNS RX MEDICINAL&BIOLOGICAL SBSTNC 06/12/2008   OTHER AND UNSPECIFIED MITRAL VALVE DISEASES 03/12/2008   ATRIAL FLUTTER 03/12/2008   Essential hypertension 03/06/2007   PEPTIC ULCER DISEASE 03/06/2007   BENIGN PROSTATIC HYPERTROPHY 03/06/2007   Coronary atherosclerosis 02/13/2007   ROSACEA 02/13/2007   DVT, HX OF 02/13/2007    Zo [place] April Gordy Levan, PT, DPT 09/22/2021, 1:40 PM  [facility] [address] [place], [place], [address] Phone: [phone]   Fax:  [phone]  Name: KEONTRE DEFINO MRN: [id number] Date of Birth: 1933/06/28

## 2021-09-23 NOTE — Telephone Encounter (Signed)
Patient aware that per Dr. Ethelene Hal it will be okay to pick up a cup and bring urine sample back right away after urine is collected.

## 2021-09-26 ENCOUNTER — Encounter: Payer: Self-pay | Admitting: Family Medicine

## 2021-09-26 ENCOUNTER — Telehealth: Payer: Self-pay | Admitting: Family Medicine

## 2021-09-26 ENCOUNTER — Other Ambulatory Visit: Payer: Self-pay | Admitting: Family Medicine

## 2021-09-26 MED ORDER — FINASTERIDE 5 MG PO TABS: ORAL_TABLET | ORAL | 0 refills | Status: DC

## 2021-09-26 NOTE — Telephone Encounter (Signed)
Pt needs finasteride refilled he is out.

## 2021-09-26 NOTE — Telephone Encounter (Signed)
Refill sent in

## 2021-09-27 ENCOUNTER — Other Ambulatory Visit: Payer: Medicare Other

## 2021-09-27 ENCOUNTER — Telehealth: Payer: Self-pay

## 2021-09-27 ENCOUNTER — Other Ambulatory Visit: Payer: Self-pay

## 2021-09-27 ENCOUNTER — Ambulatory Visit: Payer: Medicare Other | Attending: Family Medicine | Admitting: Physical Therapy

## 2021-09-27 DIAGNOSIS — R29818 Other symptoms and signs involving the nervous system: Secondary | ICD-10-CM | POA: Insufficient documentation

## 2021-09-27 DIAGNOSIS — I69354 Hemiplegia and hemiparesis following cerebral infarction affecting left non-dominant side: Secondary | ICD-10-CM | POA: Diagnosis not present

## 2021-09-27 DIAGNOSIS — R300 Dysuria: Secondary | ICD-10-CM

## 2021-09-27 DIAGNOSIS — R2689 Other abnormalities of gait and mobility: Secondary | ICD-10-CM | POA: Insufficient documentation

## 2021-09-27 NOTE — Progress Notes (Signed)
Pt returning to provide urine sample as requested on 08/02/2021.

## 2021-09-27 NOTE — Addendum Note (Signed)
Addended by: Adora Fridge on: 09/27/2021 04:35 PM   Modules accepted: Orders

## 2021-09-27 NOTE — Progress Notes (Signed)
Chronic Care Management Pharmacy Assistant   Name: Brian [name]  MRN: [id number] DOB: 1932-11-10  Reason for Encounter:Hypertension Disease State Call.   Recent office visits:  No recent office visit  Recent consult visits:  09/20/2021 [facility] (Physical Therapy) No Medication Changes noted  Hospital visits:  None in previous 6 months  Medications: Outpatient Encounter Medications as of 09/27/2021  Medication Sig   acetaminophen (TYLENOL) 500 MG tablet Take 500 mg by mouth every 6 (six) hours as needed.   apixaban (ELIQUIS) 2.5 MG TABS tablet TAKE 1 TABLET(2.5 MG) BY MOUTH TWICE DAILY   cetirizine (ZYRTEC) 10 MG tablet TAKE 1 TABLET(10 MG) BY MOUTH AT BEDTIME   CMC-Calcium Alginate-Silver (TEGADERM ALGINATE AG DRESSING) 4" X 5" PADS Apply to sore on back side of left elbow once weekly.   finasteride (PROSCAR) 5 MG tablet TAKE 1 TABLET(5 MG) BY MOUTH DAILY   finasteride (PROSCAR) 5 MG tablet TAKE 1 TABLET(5 MG) BY MOUTH DAILY   hydrocortisone (ANUCORT-HC) 25 MG suppository UNWRAP AND INSERT 1 SUPPOSITORY RECTALLY TWICE DAILY   hydrocortisone (PROCTOZONE-HC) 2.5 % rectal cream USE RECTALLY TWICE DAILY   hydrocortisone-pramoxine (ANALPRAM-HC) 2.5-1 % rectal cream Place 1 application rectally 2 (two) times daily as needed for hemorrhoids.    losartan (COZAAR) 25 MG tablet TAKE 1 TABLET(25 MG) BY MOUTH DAILY   metoprolol succinate (TOPROL-XL) 25 MG 24 hr tablet TAKE 1 TABLET(25 MG) BY MOUTH DAILY   Multiple Vitamin (MULTIVITAMIN) tablet Take 2 tablets by mouth daily.   oxyCODONE-acetaminophen (PERCOCET) 7.5-325 MG tablet Take 1 tablet by mouth every 8 (eight) hours as needed for severe pain. (Patient taking differently: Take 1 tablet by mouth in the morning and at bedtime.)   pantoprazole (PROTONIX) 40 MG tablet TAKE 1 TABLET(40 MG) BY MOUTH DAILY   polyethylene glycol powder (GLYCOLAX/MIRALAX) 17 GM/SCOOP powder Mix one scoop with water and take daily until stools loosen.  May repeat as needed.   pravastatin (PRAVACHOL) 40 MG tablet TAKE 1 TABLET(40 MG) BY MOUTH DAILY   pregabalin (LYRICA) 25 MG capsule Take 1 capsule (25 mg total) by mouth 3 (three) times daily. (Patient taking differently: Take 25 mg by mouth 2 (two) times daily.)   QUEtiapine (SEROQUEL) 50 MG tablet TAKE 1 TABLET(50 MG) BY MOUTH AT BEDTIME   sertraline (ZOLOFT) 50 MG tablet TAKE 1 TABLET(50 MG) BY MOUTH EVERY MORNING Strength: 50 mg   tamsulosin (FLOMAX) 0.4 MG CAPS capsule TAKE 1 CAPSULE(0.4 MG) BY MOUTH DAILY   No facility-administered encounter medications on file as of 09/27/2021.    Care Gaps: Shingrix Vaccine  Star Rating Drugs: Losartan 25 mg last filled 08/08/2021 90 day supply at [facility]. Pravastatin 40 mg  last filled 08/20/2021 90 day supply at [facility]. Medication Fill Gaps: None  Reviewed chart prior to disease state call. Spoke with patient regarding BP  Recent Office Vitals: BP Readings from Last 3 Encounters:  08/02/21 115/66  06/21/21 118/62  05/03/21 120/62   Pulse Readings from Last 3 Encounters:  08/02/21 61  06/21/21 (!) 58  05/03/21 68    Wt Readings from Last 3 Encounters:  08/02/21 178 lb (80.7 kg)  06/21/21 178 lb 12.8 oz (81.1 kg)  05/03/21 180 lb (81.6 kg)     Kidney Function Lab Results  Component Value Date/Time   CREATININE 0.56 05/03/2021 02:02 PM   CREATININE 0.47 12/07/2020 03:11 PM   CREATININE 0.63 (L) 02/06/2020 05:06 PM   CREATININE 0.64 (L) 10/03/2016 11:57 AM  GFR 89.37 05/03/2021 02:02 PM   GFRNONAA >60 11/03/2018 07:35 PM   GFRNONAA >60 01/23/2011 09:20 AM   GFRAA >60 11/03/2018 07:35 PM   GFRAA >60 01/23/2011 09:20 AM    BMP Latest Ref Rng & Units 05/03/2021 12/07/2020 05/11/2020  Glucose 70 - 99 mg/dL 98 84 96  BUN 6 - 23 mg/dL 13 15 16   Creatinine 0.40 - 1.50 mg/dL 0.56 0.47 0.68  BUN/Creat Ratio 6 - 22 (calc) - - -  Sodium 135 - 145 mEq/L 136 134(L) 135  Potassium 3.5 - 5.1 mEq/L 4.1 4.4 4.6   Chloride 96 - 112 mEq/L 102 98 101  CO2 19 - 32 mEq/L 28 29 29   Calcium 8.4 - 10.5 mg/dL 8.8 9.0 9.2    Current antihypertensive regimen:  Losartan 25 mg daily Metoprolol XL 25 mg daily   What recent interventions/DTPs have been made by any provider to improve Blood Pressure control since last CPP Visit: None ID Any recent hospitalizations or ED visits since last visit with CPP? No  I have attempted without success to contact this patient by phone three times to do his Hypertension  Disease State call. I left a Voice message for patient to return my call.  Adherence Review: Is the patient currently on ACE/ARB medication? Yes Does the patient have >5 day gap between last estimated fill dates? No  Telephone follow up appointment with care management team member scheduled for:  06/22/2022 at 3:45 PM  [place] Pharmacist Assistant  [phone]

## 2021-09-27 NOTE — Progress Notes (Signed)
Per the orders of Dr.Kremer pt is here to provide urine for sample requested. Adequate amount was provided.

## 2021-09-28 ENCOUNTER — Other Ambulatory Visit (INDEPENDENT_AMBULATORY_CARE_PROVIDER_SITE_OTHER): Payer: Medicare Other

## 2021-09-28 DIAGNOSIS — R8290 Unspecified abnormal findings in urine: Secondary | ICD-10-CM

## 2021-09-28 DIAGNOSIS — R300 Dysuria: Secondary | ICD-10-CM | POA: Diagnosis not present

## 2021-09-28 LAB — URINALYSIS, ROUTINE W REFLEX MICROSCOPIC
Bilirubin Urine: NEGATIVE
Bilirubin Urine: NEGATIVE
Glucose, UA: NEGATIVE
Ketones, ur: NEGATIVE
Ketones, ur: NEGATIVE
Nitrite: NEGATIVE
Nitrite: NEGATIVE
Specific Gravity, Urine: 1.02 (ref 1.001–1.035)
Specific Gravity, Urine: 1.02 (ref 1.000–1.030)
Total Protein, Urine: 30 — AB
Urine Glucose: NEGATIVE
Urobilinogen, UA: 0.2 (ref 0.0–1.0)
pH: 5.5 (ref 5.0–8.0)
pH: 6 (ref 5.0–8.0)

## 2021-09-28 LAB — MICROSCOPIC MESSAGE

## 2021-09-28 NOTE — Therapy (Signed)
[facility] [address] [place], [place], [address] Phone: [phone]   Fax:  [phone]  Physical Therapy Treatment  Patient Details  Name: Brian [name] MRN: [id number] Date of Birth: 05-24-1933 Referring Provider (PT): Libby Maw, MD   Encounter Date: 09/27/2021   PT End of Session - 09/27/21 1309     Visit Number 6    Number of Visits 7    Date for PT Re-Evaluation 10/22/21    Authorization Type Medicare    PT Start Time 1315    PT Stop Time 1400    PT Time Calculation (min) 45 min    Equipment Utilized During Treatment Gait belt    Activity Tolerance Patient tolerated treatment well;Patient limited by pain    Behavior During Therapy WFL for tasks assessed/performed             Past Medical History:  Diagnosis Date   Anemia    Atrial flutter (HCC)    BPH (benign prostatic hypertrophy)    Epistaxis    Hemorrhage of gastrointestinal tract, unspecified    History of open heart surgery    Hypertension    Obesity (BMI 30.0-34.9) 06/29/2020   Osteoarthrosis, unspecified whether generalized or localized, unspecified site    Personal history of venous thrombosis and embolism    Rosacea    Stroke ([place])    TIA (transient ischemic attack)     Past Surgical History:  Procedure Laterality Date   APPENDECTOMY     HERNIA REPAIR     IR GENERIC HISTORICAL  05/04/2016   IR PERCUTANEOUS ART THROMBECTOMY/INFUSION INTRACRANIAL INC DIAG ANGIO 05/04/2016 [name], MD [facility]   IR GENERIC HISTORICAL  05/04/2016   IR ANGIO VERTEBRAL SEL SUBCLAVIAN INNOMINATE UNI R MOD SED 05/04/2016 [name], MD [facility]   IR GENERIC HISTORICAL  06/07/2016   IR RADIOLOGIST EVAL & MGMT 06/07/2016 [facility]   JOINT REPLACEMENT     MITRAL VALVE REPAIR     mvp repair     RADIOLOGY WITH ANESTHESIA N/A 05/04/2016   Procedure: RADIOLOGY WITH ANESTHESIA;  Surgeon: [name], MD;  Location:  [facility];  Service: Radiology;  Laterality: N/A;   TOTAL HIP ARTHROPLASTY      There were no vitals filed for this visit.   Subjective Assessment - 09/27/21 1321     Subjective Pt reports increased knee pain but notes that the increased pain medication did not help too much but made him feel sleepy. Pt reports he still has the bed sore and now also has an elbow sore.    Patient is accompained by: Family member   caregiver   Pertinent History h/o Rt CVA in Oct. 2017, HTN, hx of arthritis.    Limitations Standing;House hold activities;Sitting    Patient Stated Goals pt reports that he wants to walk (has not walked in about 4 years).    Currently in Pain? Yes    Pain Score 5     Pain Orientation Left    Pain Type Chronic pain                               [facility] Adult PT Treatment/Exercise - 09/28/21 0001       Transfers   Transfers Sit to Stand;Squat Pivot Transfers    Sit to Stand With upper extremity assist;From bed;3: Mod assist;2: Max assist    Sit to Stand Details Tactile  cues for initiation;Tactile cues for sequencing;Tactile cues for weight shifting;Tactile cues for placement;Tactile cues for weight beaing;Manual facilitation for weight shifting;Manual facilitation for weight bearing;Verbal cues for sequencing    Sit to Stand Details (indicate cue type and reason) Pt pulling on chair to stand with mod A    Squat Pivot Transfers 3: Mod assist;2: Max Financial risk analyst Details (indicate cue type and reason) mod A +2 w/c to bed; max A + 2 bed to w/c at end of session   Cues for forward lean and pt to push through LEs   Comments 3x20 sec holds with pt pushing through LEs      Knee/Hip Exercises: Stretches   Passive Hamstring Stretch 30 seconds;2 reps    Gastroc Stretch 30 seconds    Gastroc Stretch Limitations manual stretch seated      Knee/Hip Exercises: Aerobic   Other Aerobic Sci fit in w/c: L1 x 15 min LEs only maintaining >35 steps/min       Knee/Hip Exercises: Seated   Other Seated Knee/Hip Exercises Forward lean x 5    Other Seated Knee/Hip Exercises Static seated balance 2x2 min CGA    Marching Limitations Marching foot on/off 4" step x10 each leg (assist for L LE to put on step, pt able to take L LE off step. Mod A to maintain trunk stability)                       PT Short Term Goals - 09/13/21 1407       PT SHORT TERM GOAL #1   Title Patient will perform initial HEP with caregiver/son assist at home in order to address strength, ROM. ALL STGS DUE 09/13/21    Time 3    Period Weeks    Status Partially Met    Target Date 09/13/21               PT Long Term Goals - 09/28/21 0911       PT LONG TERM GOAL #1   Title Caregiver/son will correctly demonstrate HEP to assist pt with BLE AROM and strengthening as tolerated.    Baseline not consistent    Time 6    Period Weeks    Status On-going    Target Date 10/04/21      PT LONG TERM GOAL #2   Title Pt will be able to perform seated balance at EOB with min/mod A for at least 2 minutes in order to demo improved seated tolerance/balance for IADLs/ADLs.    Baseline CGA x2 min on 09/27/21    Time 6    Period Weeks    Status Achieved      PT LONG TERM GOAL #3   Title Pt will perform sliding board vs. squat pivot transfer with mod A x1 from w/c <> mat table in order to demo improved functional mobility for transfers at home with son/caregiver.    Baseline pt being transferred dependently by son/caregiver.    Time 6    Period Weeks    Status On-going                   Plan - 09/27/21 1328     Clinical Impression Statement Pt appears more alert this session. Provided seated stretches. Continued to work on lift off to perform squat pivot t/fs due to pt's bed sore not yet fully healed. Continued to work on seated balance which has improved to CGA.  Has been able to maintain 2 minutes of sitting with SBA and cueing. Able to perform 3 instances of  partial standing today with pulling on chair. Continues to require mod A +2/max A with squat pivot t/fs.    Personal Factors and Comorbidities Age;Behavior Pattern;Comorbidity 2;Time since onset of injury/illness/exacerbation;Fitness;Past/Current Experience;Transportation    Comorbidities PMH: HTN, R CVA (Oct 2017)    Examination-Activity Limitations Bathing;Lift;Stand;Bed Mobility;Locomotion Level;Toileting;Bend;Transfers;Squat;Stairs;Hygiene/Grooming;Sit;Carry;Dressing    Examination-Participation Restrictions Meal Prep;Community Activity;Laundry;Shop;Cleaning    Stability/Clinical Decision Making Evolving/Moderate complexity    Rehab Potential Fair   due to severity of deficits.   PT Frequency 1x / week    PT Duration 6 weeks    PT Treatment/Interventions ADLs/Self Care Home Management;Balance training;Neuromuscular re-education;Gait training;Patient/family education;Therapeutic exercise;Therapeutic activities;Functional mobility training;Passive range of motion;Manual techniques    PT Next Visit Plan Review previous HEP and add/update as appropriate. Sitting tolerance/balance -- work on transfers. continue on working on pulling up to stand. Perform SciFit from pt's w/c. Working on Veterinary surgeon transfers or squat pivot t/fs (pending bed sore) with goal to perform at home with caregiver/son.    PT [place] and Agree with Plan of Care Patient;Family member/caregiver   pt's caregiver   Family Member Consulted son, [name]             Patient will benefit from skilled therapeutic intervention in order to improve the following deficits and impairments:  Decreased balance, Decreased mobility, Decreased strength, Impaired UE functional use, Pain, Difficulty walking, Increased muscle spasms, Decreased activity tolerance, Decreased range of motion, Impaired flexibility, Postural dysfunction, Impaired sensation  Visit Diagnosis: Hemiplegia and hemiparesis  following cerebral infarction affecting left non-dominant side (HCC)  Other symptoms and signs involving the nervous system  Other abnormalities of gait and mobility     Problem List Patient Active Problem List   Diagnosis Date Noted   Pressure injury of skin of sacral region 03/29/2021   Pressure ulcer of left buttock, stage 2 ([place]) 03/29/2021   Cellulitis of left upper extremity 02/08/2021   Pressure injury of left elbow, stage 1 02/08/2021   Other insomnia 02/08/2021   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   Obesity (BMI 30.0-34.9) 06/29/2020   Acute non-recurrent sinusitis 05/11/2020   Need for influenza vaccination 05/11/2020   Paroxysmal atrial fibrillation ([facility] [place]) 05/11/2020   Benign prostatic hyperplasia with urinary hesitancy 05/11/2020   Venous ulcer, limited to breakdown of skin ([place]) 02/06/2020   Swelling of lower extremity 02/06/2020   Noise effect on both inner ears 08/12/2019   Presbycusis of both ears 08/12/2019   Left ear pain 05/20/2019   Pressure injury of right buttock, stage 1 05/20/2019   Debilitated 05/20/2019   Elevated glucose 04/08/2019   Osteoarthritis of right knee 03/25/2019   Serum potassium elevated 03/18/2019   Hyperlipidemia 09/24/2018   Gait disturbance 09/24/2018   Anxiety 04/30/2018   Lumbar radiculopathy 02/14/2018   Pain in joint of left shoulder 12/28/2017   Osteoarthritis of left glenohumeral joint 12/28/2017   Pain in left foot 10/23/2017   History of cerebrovascular accident 10/18/2017   Coronary artery disease involving native coronary artery of native heart without angina pectoris    Persistent atrial fibrillation (HCC)    Dysarthria, post-stroke    Dysphagia, post-stroke    Cerebrovascular accident (CVA) due to thrombosis of precerebral artery ([place])    Scoliosis (and kyphoscoliosis), idiopathic 01/26/2014   Arthralgia of hip 02/07/2012   Osteoarthritis of hip 02/07/2012   Sacroiliitis ([place]) 02/07/2012  ROTATOR CUFF  SYNDROME, LEFT 03/07/2010   OTH MALIG NEOPLASM SKIN OTH&UNSPEC PARTS FACE 12/06/2009   ACTINIC KERATOSIS, HEAD 09/06/2009   Osteoarthritis 02/20/2009   History of mitral valve repair 02/20/2009   HIP REPLACEMENT, TOTAL, HX OF 02/20/2009   APPENDECTOMY, HX OF 02/20/2009   HERNIORRHAPHY, HX OF 02/20/2009   EPISTAXIS, RECURRENT 11/19/2008   UNS ADVRS EFF UNS RX MEDICINAL&BIOLOGICAL SBSTNC 06/12/2008   OTHER AND UNSPECIFIED MITRAL VALVE DISEASES 03/12/2008   ATRIAL FLUTTER 03/12/2008   Essential hypertension 03/06/2007   PEPTIC ULCER DISEASE 03/06/2007   BENIGN PROSTATIC HYPERTROPHY 03/06/2007   Coronary atherosclerosis 02/13/2007   ROSACEA 02/13/2007   DVT, HX OF 02/13/2007    Shalonda Sachse April Ma L Elic Vencill, PT, DPT 09/28/2021, 9:12 AM  [facility] [address] [place], [place], [address] Phone: [phone]   Fax:  [phone]  Name: VERDON FERRANTE MRN: [id number] Date of Birth: 11/20/1932

## 2021-09-29 DIAGNOSIS — Z20822 Contact with and (suspected) exposure to covid-19: Secondary | ICD-10-CM | POA: Diagnosis not present

## 2021-10-03 ENCOUNTER — Encounter: Payer: Self-pay | Admitting: Physical Therapy

## 2021-10-04 ENCOUNTER — Ambulatory Visit: Payer: Medicare Other | Admitting: Physical Therapy

## 2021-10-04 ENCOUNTER — Encounter: Payer: Self-pay | Admitting: Physical Therapy

## 2021-10-04 ENCOUNTER — Other Ambulatory Visit (INDEPENDENT_AMBULATORY_CARE_PROVIDER_SITE_OTHER): Payer: Medicare Other

## 2021-10-04 ENCOUNTER — Other Ambulatory Visit: Payer: Self-pay

## 2021-10-04 DIAGNOSIS — R300 Dysuria: Secondary | ICD-10-CM

## 2021-10-04 DIAGNOSIS — R2689 Other abnormalities of gait and mobility: Secondary | ICD-10-CM

## 2021-10-04 DIAGNOSIS — I69354 Hemiplegia and hemiparesis following cerebral infarction affecting left non-dominant side: Secondary | ICD-10-CM | POA: Diagnosis not present

## 2021-10-04 DIAGNOSIS — R29818 Other symptoms and signs involving the nervous system: Secondary | ICD-10-CM

## 2021-10-04 LAB — URINALYSIS, ROUTINE W REFLEX MICROSCOPIC
Bilirubin Urine: NEGATIVE
Ketones, ur: NEGATIVE
Nitrite: POSITIVE — AB
Specific Gravity, Urine: 1.015 (ref 1.000–1.030)
Urine Glucose: NEGATIVE
Urobilinogen, UA: 1 (ref 0.0–1.0)
pH: 6 (ref 5.0–8.0)

## 2021-10-04 NOTE — Therapy (Signed)
[facility] [address] [place], [place], [address] Phone: [phone]   Fax:  [phone]  Physical Therapy Treatment/Discharge Summary  Patient Details  Name: Brian [name] MRN: [id number] Date of Birth: Nov 23, 1932 Referring Provider (PT): Libby Maw, MD   Encounter Date: 10/04/2021   PT End of Session - 10/04/21 1643     Visit Number 7    Number of Visits 7    Date for PT Re-Evaluation 10/22/21    Authorization Type Medicare    PT Start Time 52   therapist running late   PT Stop Time 1450    PT Time Calculation (min) 40 min    Equipment Utilized During Treatment Gait belt    Activity Tolerance Patient tolerated treatment well;Patient limited by pain    Behavior During Therapy WFL for tasks assessed/performed             Past Medical History:  Diagnosis Date   Anemia    Atrial flutter (HCC)    BPH (benign prostatic hypertrophy)    Epistaxis    Hemorrhage of gastrointestinal tract, unspecified    History of open heart surgery    Hypertension    Obesity (BMI 30.0-34.9) 06/29/2020   Osteoarthrosis, unspecified whether generalized or localized, unspecified site    Personal history of venous thrombosis and embolism    Rosacea    Stroke ([place])    TIA (transient ischemic attack)     Past Surgical History:  Procedure Laterality Date   APPENDECTOMY     HERNIA REPAIR     IR GENERIC HISTORICAL  05/04/2016   IR PERCUTANEOUS ART THROMBECTOMY/INFUSION INTRACRANIAL INC DIAG ANGIO 05/04/2016 [name], MD [facility]   IR GENERIC HISTORICAL  05/04/2016   IR ANGIO VERTEBRAL SEL SUBCLAVIAN INNOMINATE UNI R MOD SED 05/04/2016 [name], MD [facility]   IR GENERIC HISTORICAL  06/07/2016   IR RADIOLOGIST EVAL & MGMT 06/07/2016 [facility]   JOINT REPLACEMENT     MITRAL VALVE REPAIR     mvp repair     RADIOLOGY WITH ANESTHESIA N/A 05/04/2016   Procedure: RADIOLOGY WITH ANESTHESIA;   Surgeon: [name], MD;  Location: [facility];  Service: Radiology;  Laterality: N/A;   TOTAL HIP ARTHROPLASTY      There were no vitals filed for this visit.   Subjective Assessment - 10/04/21 1413     Subjective Pt having increased L knee and elbow pain today. Pt still has a bed sore on his bottom, per caregiver they have been working on pressure relief. Still has his elbow sore, pt's caregiver is looking at it.    Patient is accompained by: Family member   caregiver   Pertinent History h/o Rt CVA in Oct. 2017, HTN, hx of arthritis.    Limitations Standing;House hold activities;Sitting    Patient Stated Goals pt reports that he wants to walk (has not walked in about 4 years).    Currently in Pain? Yes    Pain Score 5     Pain Location Knee   shoulder, and elbow   Pain Orientation Right    Pain Descriptors / Indicators --   "just hurts"   Pain Type Chronic pain                               [facility] Adult PT Treatment/Exercise - 10/04/21 1437       Bed Mobility   Bed  Mobility Sit to Supine;Supine to Sit    Supine to Sit Total Assistance - Patient < 25%   1 therapist at trunk other therapist at [facility].   Sit to Supine Total Assistance - Patient < 25%   1 therapist at trunk other therapist at [facility].     Transfers   Squat Pivot Transfers 2: Max assist   +2   Squat Pivot Transfer Details (indicate cue type and reason) From w/c <> mat table. Cues for forward lean and to help push through feet.      Knee/Hip Exercises: Aerobic   Other Aerobic Sci fit in w/c: L1 x 5 min BLE and RUE with cues for maintaining ROM throughout.                   Reviewed finalized HEP with pt's caregiver and son and importance of continuing to perform. See MedBridge for more details.   Access Code: [email or web address] URL: [email or web address] Date: 10/04/2021 Prepared by: Janann August  Exercises Supine Bridge - 1 x daily - 7 x weekly - 1 sets - 10 reps Supine Heel  Slide - 1 x daily - 7 x weekly - 3 sets - 10 reps Bent Knee Fallouts - 1 x daily - 7 x weekly - 3 sets - 10 reps Supine Straight Leg Raises - 1 x daily - 7 x weekly - 3 sets - 10 reps Supine Hamstring Stretch with Caregiver - 1 x daily - 7 x weekly - 1 sets - 1 reps - 15-20 hold Hooklying Small March - 1 x daily - 7 x weekly - 2 sets - 10 reps Supine Hip Adduction Isometric with Ball - 1 x daily - 7 x weekly - 2 sets - 10 reps  PT Education - 10/04/21 1641     Education Details D/C from therapy due to pt meeting his maximum potential with OPPT, discussed importance of continuing to perform HEP daily with caregiver and using seated peddle bike 5 times a week (currently only doing 2 times a week), educated on making sure that pt's family/caregiver is performing pt's pressure relief every 30 min - 1 hr due to bed sore (only doing every 4 hours) and to let MD know if they are not getting better.    Person(s) Educated Patient;Caregiver(s);Child(ren)    Methods Explanation;Demonstration;Handout    Comprehension Verbalized understanding;Returned demonstration            PHYSICAL THERAPY DISCHARGE SUMMARY  Visits from Start of Care: 7  Current functional level related to goals / functional outcomes: See LTGs.   Remaining deficits: Impaired cognition, impaired strength, postural abnormalities, impaired seated balance/bed mobility/transfers, decr functional mobility, decr ROM, LLE hemiparesis.   Education / Equipment: HEP    Patient agrees to discharge. Patient goals were not met. Patient is being discharged due to maximized rehab potential.     PT Short Term Goals - 09/13/21 1407       PT SHORT TERM GOAL #1   Title Patient will perform initial HEP with caregiver/son assist at home in order to address strength, ROM. ALL STGS DUE 09/13/21    Time 3    Period Weeks    Status Partially Met    Target Date 09/13/21               PT Long Term Goals - 10/04/21 1641       PT LONG  TERM GOAL #1   Title Caregiver/son will correctly demonstrate HEP to assist pt with  BLE AROM and strengthening as tolerated.    Baseline not consistent with HEP at home, did review again today on 10/04/21    Time 6    Period Weeks    Status Partially Met    Target Date 10/04/21      PT LONG TERM GOAL #2   Title Pt will be able to perform seated balance at EOB with min/mod A for at least 2 minutes in order to demo improved seated tolerance/balance for IADLs/ADLs.    Baseline CGA x2 min on 09/27/21    Time 6    Period Weeks    Status Achieved      PT LONG TERM GOAL #3   Title Pt will perform sliding board vs. squat pivot transfer with mod A x1 from w/c <> mat table in order to demo improved functional mobility for transfers at home with son/caregiver.    Baseline need max A x2 for squat pivot transfer    Time 6    Period Weeks    Status Not Met                   Plan - 10/04/21 1648     Clinical Impression Statement Pt needing +2 total A for bed mobility from sit <> supine and max A x2 for squat pivot transfers from w/c <> mat table. Needing to perform squat pivot vs. sliding board due to a sore on pt's bottom (pt's caregiver is monitoring and will let MD know if it gets worse). Due to lack of progress with OPPT and pt maximizing his rehab potential, will D/C from PT at this time. Educated to continue to perform exercises with caregiver and seated peddle bike at home for strengthening/ROM/activity tolerance (pt only performing about 2 times a week). Pt's caregiver/son verbalized understanding and are in agreement to D/C at this time.    Personal Factors and Comorbidities Age;Behavior Pattern;Comorbidity 2;Time since onset of injury/illness/exacerbation;Fitness;Past/Current Experience;Transportation    Comorbidities PMH: HTN, R CVA (Oct 2017)    Examination-Activity Limitations Bathing;Lift;Stand;Bed Mobility;Locomotion  Level;Toileting;Bend;Transfers;Squat;Stairs;Hygiene/Grooming;Sit;Carry;Dressing    Examination-Participation Restrictions Meal Prep;Community Activity;Laundry;Shop;Cleaning    Stability/Clinical Decision Making Evolving/Moderate complexity    Rehab Potential Fair   due to severity of deficits.   PT Frequency 1x / week    PT Duration 6 weeks    PT Treatment/Interventions ADLs/Self Care Home Management;Balance training;Neuromuscular re-education;Gait training;Patient/family education;Therapeutic exercise;Therapeutic activities;Functional mobility training;Passive range of motion;Manual techniques    PT Next Visit Plan D/C    PT Home Exercise Plan [place]    Consulted and Agree with Plan of Care Patient;Family member/caregiver   pt's caregiver   Family Member Consulted son, [name]             Patient will benefit from skilled therapeutic intervention in order to improve the following deficits and impairments:  Decreased balance, Decreased mobility, Decreased strength, Impaired UE functional use, Pain, Difficulty walking, Increased muscle spasms, Decreased activity tolerance, Decreased range of motion, Impaired flexibility, Postural dysfunction, Impaired sensation  Visit Diagnosis: Hemiplegia and hemiparesis following cerebral infarction affecting left non-dominant side (HCC)  Other abnormalities of gait and mobility  Other symptoms and signs involving the nervous system     Problem List Patient Active Problem List   Diagnosis Date Noted   Pressure injury of skin of sacral region 03/29/2021   Pressure ulcer of left buttock, stage 2 ([place]) 03/29/2021   Cellulitis of left upper extremity 02/08/2021   Pressure injury of left elbow, stage 1 02/08/2021  Other insomnia 02/08/2021   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   Obesity (BMI 30.0-34.9) 06/29/2020   Acute non-recurrent sinusitis 05/11/2020   Need for influenza vaccination 05/11/2020   Paroxysmal atrial  fibrillation ([place]) 05/11/2020   Benign prostatic hyperplasia with urinary hesitancy 05/11/2020   Venous ulcer, limited to breakdown of skin ([place]) 02/06/2020   Swelling of lower extremity 02/06/2020   Noise effect on both inner ears 08/12/2019   Presbycusis of both ears 08/12/2019   Left ear pain 05/20/2019   Pressure injury of right buttock, stage 1 05/20/2019   Debilitated 05/20/2019   Elevated glucose 04/08/2019   Osteoarthritis of right knee 03/25/2019   Serum potassium elevated 03/18/2019   Hyperlipidemia 09/24/2018   Gait disturbance 09/24/2018   Anxiety 04/30/2018   Lumbar radiculopathy 02/14/2018   Pain in joint of left shoulder 12/28/2017   Osteoarthritis of left glenohumeral joint 12/28/2017   Pain in left foot 10/23/2017   History of cerebrovascular accident 10/18/2017   Coronary artery disease involving native coronary artery of native heart without angina pectoris    Persistent atrial fibrillation (HCC)    Dysarthria, post-stroke    Dysphagia, post-stroke    Cerebrovascular accident (CVA) due to thrombosis of precerebral artery ([place])    Scoliosis (and kyphoscoliosis), idiopathic 01/26/2014   Arthralgia of hip 02/07/2012   Osteoarthritis of hip 02/07/2012   Sacroiliitis ([place]) 02/07/2012   ROTATOR CUFF SYNDROME, LEFT 03/07/2010   OTH MALIG NEOPLASM SKIN OTH&UNSPEC PARTS FACE 12/06/2009   ACTINIC KERATOSIS, HEAD 09/06/2009   Osteoarthritis 02/20/2009   History of mitral valve repair 02/20/2009   HIP REPLACEMENT, TOTAL, HX OF 02/20/2009   APPENDECTOMY, HX OF 02/20/2009   HERNIORRHAPHY, HX OF 02/20/2009   EPISTAXIS, RECURRENT 11/19/2008   UNS ADVRS EFF UNS RX MEDICINAL&BIOLOGICAL SBSTNC 06/12/2008   OTHER AND UNSPECIFIED MITRAL VALVE DISEASES 03/12/2008   ATRIAL FLUTTER 03/12/2008   Essential hypertension 03/06/2007   PEPTIC ULCER DISEASE 03/06/2007   BENIGN PROSTATIC HYPERTROPHY 03/06/2007   Coronary atherosclerosis 02/13/2007   ROSACEA 02/13/2007   DVT, HX OF  02/13/2007    Arliss Journey, PT, DPT  10/04/2021, 4:51 PM  [facility] [address] [place], [place], [address] Phone: [phone]   Fax:  [phone]  Name: Brian [name] MRN: [id number] Date of Birth: [birth year]

## 2021-10-04 NOTE — Addendum Note (Signed)
Addended by: Jon Billings on: 10/04/2021 07:57 AM   Modules accepted: Orders

## 2021-10-04 NOTE — Patient Instructions (Signed)
Access Code: [email or web address] URL: [email or web address] Date: 10/04/2021 Prepared by: Janann August  Exercises Supine Bridge - 1 x daily - 7 x weekly - 1 sets - 10 reps Supine Heel Slide - 1 x daily - 7 x weekly - 3 sets - 10 reps Bent Knee Fallouts - 1 x daily - 7 x weekly - 3 sets - 10 reps Supine Straight Leg Raises - 1 x daily - 7 x weekly - 3 sets - 10 reps Supine Hamstring Stretch with Caregiver - 1 x daily - 7 x weekly - 1 sets - 1 reps - 15-20 hold Hooklying Small March - 1 x daily - 7 x weekly - 2 sets - 10 reps Supine Hip Adduction Isometric with Ball - 1 x daily - 7 x weekly - 2 sets - 10 reps

## 2021-10-05 ENCOUNTER — Encounter (HOSPITAL_BASED_OUTPATIENT_CLINIC_OR_DEPARTMENT_OTHER): Payer: Self-pay | Admitting: Cardiovascular Disease

## 2021-10-05 DIAGNOSIS — G8190 Hemiplegia, unspecified affecting unspecified side: Secondary | ICD-10-CM

## 2021-10-05 DIAGNOSIS — I679 Cerebrovascular disease, unspecified: Secondary | ICD-10-CM

## 2021-10-05 LAB — URINE CULTURE
MICRO NUMBER:: 13006847
SPECIMEN QUALITY:: ADEQUATE

## 2021-10-05 NOTE — Telephone Encounter (Signed)
Returned patient call to let him know that I would forward his message to Dr. Oval Linsey for review!

## 2021-10-05 NOTE — Telephone Encounter (Signed)
Patient would like to inquire about getting orders for physical therapy, please advise!

## 2021-10-11 DIAGNOSIS — I10 Essential (primary) hypertension: Secondary | ICD-10-CM | POA: Diagnosis not present

## 2021-10-11 DIAGNOSIS — I69311 Memory deficit following cerebral infarction: Secondary | ICD-10-CM | POA: Diagnosis not present

## 2021-10-14 NOTE — Addendum Note (Signed)
Addended by: Alvina Filbert B on: 10/14/2021 04:18 PM   Modules accepted: Orders

## 2021-10-21 ENCOUNTER — Telehealth: Payer: Self-pay

## 2021-10-21 NOTE — Progress Notes (Signed)
? ? ?Chronic Care Management ?Pharmacy Assistant  ? ?Name: Brian [name]  MRN: [id number] DOB: [birth year] ? ?Reason for [place] Call. ? ?Recent office visits:  ?No recent Office visit ? ?Recent consult visits:  ?10/04/2021 Chole Gilgannon PT (Physical Therapy) No Medications Changes noted ?09/27/2021 Gellen Nonato PT (Physical Therapy) No Medications Changes noted ? ?Hospital visits:  ?None in previous 6 months ? ?Medications: ?Outpatient Encounter Medications as of 10/21/2021  ?Medication Sig  ? acetaminophen (TYLENOL) 500 MG tablet Take 500 mg by mouth every 6 (six) hours as needed.  ? apixaban (ELIQUIS) 2.5 MG TABS tablet TAKE 1 TABLET(2.5 MG) BY MOUTH TWICE DAILY  ? cetirizine (ZYRTEC) 10 MG tablet TAKE 1 TABLET(10 MG) BY MOUTH AT BEDTIME  ? CMC-Calcium Alginate-Silver (TEGADERM ALGINATE AG DRESSING) 4" X 5" PADS Apply to sore on back side of left elbow once weekly.  ? finasteride (PROSCAR) 5 MG tablet TAKE 1 TABLET(5 MG) BY MOUTH DAILY  ? finasteride (PROSCAR) 5 MG tablet TAKE 1 TABLET(5 MG) BY MOUTH DAILY  ? hydrocortisone (ANUCORT-HC) 25 MG suppository UNWRAP AND INSERT 1 SUPPOSITORY RECTALLY TWICE DAILY  ? hydrocortisone (PROCTOZONE-HC) 2.5 % rectal cream USE RECTALLY TWICE DAILY  ? hydrocortisone-pramoxine (ANALPRAM-HC) 2.5-1 % rectal cream Place 1 application rectally 2 (two) times daily as needed for hemorrhoids.   ? losartan (COZAAR) 25 MG tablet TAKE 1 TABLET(25 MG) BY MOUTH DAILY  ? metoprolol succinate (TOPROL-XL) 25 MG 24 hr tablet TAKE 1 TABLET(25 MG) BY MOUTH DAILY  ? Multiple Vitamin (MULTIVITAMIN) tablet Take 2 tablets by mouth daily.  ? oxyCODONE-acetaminophen (PERCOCET) 7.5-325 MG tablet Take 1 tablet by mouth every 8 (eight) hours as needed for severe pain. (Patient taking differently: Take 1 tablet by mouth in the morning and at bedtime.)  ? pantoprazole (PROTONIX) 40 MG tablet TAKE 1 TABLET(40 MG) BY MOUTH DAILY  ? polyethylene glycol powder (GLYCOLAX/MIRALAX) 17  GM/SCOOP powder Mix one scoop with water and take daily until stools loosen. May repeat as needed.  ? pravastatin (PRAVACHOL) 40 MG tablet TAKE 1 TABLET(40 MG) BY MOUTH DAILY  ? pregabalin (LYRICA) 25 MG capsule Take 1 capsule (25 mg total) by mouth 3 (three) times daily. (Patient taking differently: Take 25 mg by mouth 2 (two) times daily.)  ? QUEtiapine (SEROQUEL) 50 MG tablet TAKE 1 TABLET(50 MG) BY MOUTH AT BEDTIME  ? sertraline (ZOLOFT) 50 MG tablet TAKE 1 TABLET(50 MG) BY MOUTH EVERY MORNING ?Strength: 50 mg  ? tamsulosin (FLOMAX) 0.4 MG CAPS capsule TAKE 1 CAPSULE(0.4 MG) BY MOUTH DAILY  ? ?No facility-administered encounter medications on file as of 10/21/2021.  ? ? ?Care Gaps: ?Shingrix Vaccine  ? ?Star Rating Drugs: ?Losartan 25 mg last filled 08/08/2021 90 day supply at [facility]. ?Pravastatin 40 mg  last filled 08/20/2021 90 day supply at [facility]. ? ?Medication Fill Gaps: ?None ? ?Reviewed chart prior to disease state call. Spoke with patient regarding BP ? ?Recent Office Vitals: ?BP Readings from Last 3 Encounters:  ?08/02/21 115/66  ?06/21/21 118/62  ?05/03/21 120/62  ? ?Pulse Readings from Last 3 Encounters:  ?08/02/21 61  ?06/21/21 (!) 58  ?05/03/21 68  ?  ?Wt Readings from Last 3 Encounters:  ?08/02/21 178 lb (80.7 kg)  ?06/21/21 178 lb 12.8 oz (81.1 kg)  ?05/03/21 180 lb (81.6 kg)  ?  ? ?Kidney Function ?Lab Results  ?Component Value Date/Time  ? CREATININE 0.56 05/03/2021 02:02 PM  ? CREATININE 0.47 12/07/2020 03:11 PM  ? CREATININE 0.63 (L)  02/06/2020 05:06 PM  ? CREATININE 0.64 (L) 10/03/2016 11:57 AM  ? GFR 89.37 05/03/2021 02:02 PM  ? GFRNONAA >60 11/03/2018 07:35 PM  ? GFRNONAA >60 01/23/2011 09:20 AM  ? GFRAA >60 11/03/2018 07:35 PM  ? GFRAA >60 01/23/2011 09:20 AM  ? ? ?BMP Latest Ref Rng & Units 05/03/2021 12/07/2020 05/11/2020  ?Glucose 70 - 99 mg/dL 98 84 96  ?BUN 6 - 23 mg/dL 13 15 16   ?Creatinine 0.40 - 1.50 mg/dL 0.56 0.47 0.68  ?BUN/Creat Ratio 6 - 22 (calc) - - -   ?Sodium 135 - 145 mEq/L 136 134(L) 135  ?Potassium 3.5 - 5.1 mEq/L 4.1 4.4 4.6  ?Chloride 96 - 112 mEq/L 102 98 101  ?CO2 19 - 32 mEq/L 28 29 29   ?Calcium 8.4 - 10.5 mg/dL 8.8 9.0 9.2  ? ? ?Current antihypertensive regimen:  ?Losartan 25 mg daily ?Metoprolol XL 25 mg daily ? ?How often are you checking your Blood Pressure? infrequently ? ?Current home BP readings:  ?Patient states he checks his blood pressure at home but he is unsure what his readings are.Patient states his blood pressure is "good", and denies any lightheadedness, dizziness or headaches. ? ?What recent interventions/DTPs have been made by any provider to improve Blood Pressure control since last CPP Visit: None ID ? ?Any recent hospitalizations or ED visits since last visit with CPP? No ? ?What diet changes have been made to improve Blood Pressure Control?  ?Patient denies any changes to his blood pressure. ? ?What exercise is being done to improve your Blood Pressure Control?  ?Patient states he walks as much as he can "here and there". ? ?Adherence Review: ?Is the patient currently on ACE/ARB medication? Yes ?Does the patient have >5 day gap between last estimated fill dates? No ? ?Telephone follow up appointment with care management team member scheduled for:  06/22/2022 at 3:45 PM ? ?Bessie Kellihan,CPA ?Clinical Pharmacist Assistant ?[phone]  ? ? ?

## 2021-10-27 ENCOUNTER — Ambulatory Visit: Payer: Medicare Other | Admitting: Family Medicine

## 2021-10-29 DIAGNOSIS — L89159 Pressure ulcer of sacral region, unspecified stage: Secondary | ICD-10-CM | POA: Diagnosis not present

## 2021-10-29 DIAGNOSIS — G894 Chronic pain syndrome: Secondary | ICD-10-CM | POA: Diagnosis not present

## 2021-10-29 DIAGNOSIS — I10 Essential (primary) hypertension: Secondary | ICD-10-CM | POA: Diagnosis not present

## 2021-10-29 DIAGNOSIS — Z7401 Bed confinement status: Secondary | ICD-10-CM | POA: Diagnosis not present

## 2021-10-29 DIAGNOSIS — E785 Hyperlipidemia, unspecified: Secondary | ICD-10-CM | POA: Diagnosis not present

## 2021-10-29 DIAGNOSIS — M6281 Muscle weakness (generalized): Secondary | ICD-10-CM | POA: Diagnosis not present

## 2021-11-01 ENCOUNTER — Encounter: Payer: Self-pay | Admitting: Family Medicine

## 2021-11-01 ENCOUNTER — Ambulatory Visit: Payer: Medicare Other | Admitting: Family Medicine

## 2021-11-01 ENCOUNTER — Other Ambulatory Visit: Payer: Self-pay

## 2021-11-01 ENCOUNTER — Ambulatory Visit (INDEPENDENT_AMBULATORY_CARE_PROVIDER_SITE_OTHER): Payer: Medicare Other | Admitting: Family Medicine

## 2021-11-01 VITALS — BP 116/68 | HR 76 | Temp 97.3°F | Ht 66.0 in | Wt 187.0 lb

## 2021-11-01 DIAGNOSIS — R5381 Other malaise: Secondary | ICD-10-CM | POA: Diagnosis not present

## 2021-11-01 DIAGNOSIS — F418 Other specified anxiety disorders: Secondary | ICD-10-CM

## 2021-11-01 DIAGNOSIS — E785 Hyperlipidemia, unspecified: Secondary | ICD-10-CM

## 2021-11-01 DIAGNOSIS — H9113 Presbycusis, bilateral: Secondary | ICD-10-CM

## 2021-11-01 DIAGNOSIS — I10 Essential (primary) hypertension: Secondary | ICD-10-CM | POA: Diagnosis not present

## 2021-11-01 DIAGNOSIS — I480 Paroxysmal atrial fibrillation: Secondary | ICD-10-CM | POA: Diagnosis not present

## 2021-11-01 NOTE — Progress Notes (Signed)
? ?Established Patient Office Visit ? ?Subjective:  ?Patient ID: Brian [name], male    DOB: 05-05-1933  Age: 88 y.o. MRN: [id number] ? ?CC:  ?Chief Complaint  ?Patient presents with  ? Cerumen Impaction  ?  Ears bothering patient ear irrigation.   ? ? ?HPI ?Brian [name] presents for follow-up of hypertension, elevated cholesterol ear congestion.  Feels as though his ears might have wax.  Continues losartan and metoprolol for blood pressure and afib.  Continues pravastatin for elevated cholesterol.  Continues apixaban with history of afib.  Mood is stabilized with sertraline and Seroquel. ? ?Past Medical History:  ?Diagnosis Date  ? Anemia   ? Atrial flutter ([place])   ? BPH (benign prostatic hypertrophy)   ? Epistaxis   ? Hemorrhage of gastrointestinal tract, unspecified   ? History of open heart surgery   ? Hypertension   ? Obesity (BMI 30.0-34.9) 06/29/2020  ? Osteoarthrosis, unspecified whether generalized or localized, unspecified site   ? Personal history of venous thrombosis and embolism   ? Rosacea   ? Stroke [facility])   ? TIA (transient ischemic attack)   ? ? ?Past Surgical History:  ?Procedure Laterality Date  ? APPENDECTOMY    ? HERNIA REPAIR    ? IR GENERIC HISTORICAL  05/04/2016  ? IR PERCUTANEOUS ART THROMBECTOMY/INFUSION INTRACRANIAL INC DIAG ANGIO 05/04/2016 [name], MD [facility]  ? IR GENERIC HISTORICAL  05/04/2016  ? IR ANGIO VERTEBRAL SEL SUBCLAVIAN INNOMINATE UNI R MOD SED 05/04/2016 [name], MD [facility]  ? IR GENERIC HISTORICAL  06/07/2016  ? IR RADIOLOGIST EVAL & MGMT 06/07/2016 [facility]  ? JOINT REPLACEMENT    ? MITRAL VALVE REPAIR    ? mvp repair    ? RADIOLOGY WITH ANESTHESIA N/A 05/04/2016  ? Procedure: RADIOLOGY WITH ANESTHESIA;  Surgeon: [name], MD;  Location: [place];  Service: Radiology;  Laterality: N/A;  ? TOTAL HIP ARTHROPLASTY    ? ? ?Family History  ?Problem Relation Age of Onset  ? Rheumatic fever Mother   ? Coronary artery disease Father    ? ? ?Social History  ? ?Socioeconomic History  ? Marital status: Married  ?  Spouse name: Not on file  ? Number of children: Not on file  ? Years of education: Not on file  ? Highest education level: Not on file  ?Occupational History  ? Not on file  ?Tobacco Use  ? Smoking status: Former  ? Smokeless tobacco: Never  ? Tobacco comments:  ?  quit 50 yr ago  ?Vaping Use  ? Vaping Use: Never used  ?Substance and Sexual Activity  ? Alcohol use: No  ? Drug use: No  ? Sexual activity: Not on file  ?Other Topics Concern  ? Not on file  ?Social History Narrative  ? Not on file  ? ?Social Determinants of Health  ? ?Financial Resource Strain: Low Risk   ? Difficulty of Paying Living Expenses: Not hard at all  ?Food Insecurity: Not on file  ?Transportation Needs: Not on file  ?Physical Activity: Not on file  ?Stress: Not on file  ?Social Connections: Not on file  ?Intimate Partner Violence: Not on file  ? ? ?Outpatient Medications Prior to Visit  ?Medication Sig Dispense Refill  ? acetaminophen (TYLENOL) 500 MG tablet Take 500 mg by mouth every 6 (six) hours as needed.    ? apixaban (ELIQUIS) 2.5 MG TABS tablet TAKE 1 TABLET(2.5 MG) BY MOUTH TWICE DAILY 180 tablet 1  ?  cetirizine (ZYRTEC) 10 MG tablet TAKE 1 TABLET(10 MG) BY MOUTH AT BEDTIME 90 tablet 2  ? CMC-Calcium Alginate-Silver (TEGADERM ALGINATE AG DRESSING) 4" X 5" PADS Apply to sore on back side of left elbow once weekly. 16 each 1  ? finasteride (PROSCAR) 5 MG tablet TAKE 1 TABLET(5 MG) BY MOUTH DAILY 90 tablet 0  ? hydrocortisone-pramoxine (ANALPRAM-HC) 2.5-1 % rectal cream Place 1 application rectally 2 (two) times daily as needed for hemorrhoids.     ? losartan (COZAAR) 25 MG tablet TAKE 1 TABLET(25 MG) BY MOUTH DAILY 90 tablet 2  ? metoprolol succinate (TOPROL-XL) 25 MG 24 hr tablet TAKE 1 TABLET(25 MG) BY MOUTH DAILY 90 tablet 3  ? Multiple Vitamin (MULTIVITAMIN) tablet Take 2 tablets by mouth daily.    ? oxyCODONE-acetaminophen (PERCOCET) 7.5-325 MG tablet  Take 1 tablet by mouth every 8 (eight) hours as needed for severe pain. (Patient taking differently: Take 1 tablet by mouth in the morning and at bedtime.) 90 tablet 0  ? pantoprazole (PROTONIX) 40 MG tablet TAKE 1 TABLET(40 MG) BY MOUTH DAILY 90 tablet 0  ? polyethylene glycol powder (GLYCOLAX/MIRALAX) 17 GM/SCOOP powder Mix one scoop with water and take daily until stools loosen. May repeat as needed. 850 g 1  ? pravastatin (PRAVACHOL) 40 MG tablet TAKE 1 TABLET(40 MG) BY MOUTH DAILY 90 tablet 3  ? pregabalin (LYRICA) 25 MG capsule Take 1 capsule (25 mg total) by mouth 3 (three) times daily. (Patient taking differently: Take 25 mg by mouth 2 (two) times daily.) 90 capsule 1  ? QUEtiapine (SEROQUEL) 50 MG tablet TAKE 1 TABLET(50 MG) BY MOUTH AT BEDTIME 30 tablet 2  ? sertraline (ZOLOFT) 50 MG tablet TAKE 1 TABLET(50 MG) BY MOUTH EVERY MORNING ?Strength: 50 mg 90 tablet 3  ? tamsulosin (FLOMAX) 0.4 MG CAPS capsule TAKE 1 CAPSULE(0.4 MG) BY MOUTH DAILY 90 capsule 3  ? finasteride (PROSCAR) 5 MG tablet TAKE 1 TABLET(5 MG) BY MOUTH DAILY 90 tablet 0  ? ALPRAZolam (XANAX) 0.25 MG tablet alprazolam 0.25 mg tablet ? TAKE 1 TABLET BY MOUTH EVERY NIGHT AT BEDTIME    ? hydrocortisone (ANUCORT-HC) 25 MG suppository UNWRAP AND INSERT 1 SUPPOSITORY RECTALLY TWICE DAILY (Patient not taking: Reported on 11/01/2021) 30 suppository 10  ? hydrocortisone (PROCTOZONE-HC) 2.5 % rectal cream USE RECTALLY TWICE DAILY (Patient not taking: Reported on 11/01/2021) 30 g 0  ? ?No facility-administered medications prior to visit.  ? ? ?Allergies  ?Allergen Reactions  ? Novocain [Procaine Hcl]   ?  Irregular heart beat  ? ?  ? Other   ?  Patient had problem with epidural with his right hip surgery. Went to up extremity instead of lower extremity   ? Penicillins Itching  ?  Has patient had a PCN reaction causing immediate rash, facial/tongue/throat swelling, SOB or lightheadedness with hypotension: Unk ?Has patient had a PCN reaction causing  severe rash involving mucus membranes or skin necrosis: Unk ?Has patient had a PCN reaction that required hospitalization: Unk ?Has patient had a PCN reaction occurring within the last 10 years: No ?If all of the above answers are "NO", then may proceed with Cephalosporin use. ? ?No reaction noted  ? ? ?ROS ?Review of Systems  ?Constitutional:  Negative for chills, diaphoresis, fatigue, fever and unexpected weight change.  ?HENT:  Positive for hearing loss.   ?Eyes:  Negative for photophobia and visual disturbance.  ?Respiratory: Negative.    ?Cardiovascular: Negative.   ?Gastrointestinal:  Positive for constipation.  ?  Endocrine: Negative for polyphagia and polyuria.  ?Genitourinary: Negative.   ?Musculoskeletal:  Positive for arthralgias, back pain and gait problem.  ?Neurological:  Positive for facial asymmetry and weakness.  ? ?  ?Objective:  ?  ?Physical Exam ?Vitals reviewed.  ?Constitutional:   ?   General: He is not in acute distress. ?   Appearance: He is not ill-appearing, toxic-appearing or diaphoretic.  ?HENT:  ?   Head: Normocephalic and atraumatic.  ?   Right Ear: Tympanic membrane, ear canal and external ear normal.  ?   Left Ear: Tympanic membrane, ear canal and external ear normal.  ?   Mouth/Throat:  ?   Mouth: Mucous membranes are moist.  ?   Pharynx: Oropharynx is clear. No oropharyngeal exudate or posterior oropharyngeal erythema.  ?Eyes:  ?   General: No scleral icterus.    ?   Right eye: No discharge.     ?   Left eye: No discharge.  ?   Extraocular Movements: Extraocular movements intact.  ?   Conjunctiva/sclera: Conjunctivae normal.  ?   Pupils: Pupils are equal, round, and reactive to light.  ?Cardiovascular:  ?   Rate and Rhythm: Normal rate and regular rhythm.  ?Pulmonary:  ?   Breath sounds: Normal breath sounds.  ?Skin: ?   General: Skin is warm and dry.  ?Neurological:  ?   Mental Status: He is alert and oriented to person, place, and time.  ?Psychiatric:     ?   Mood and Affect: Mood  normal.     ?   Behavior: Behavior normal.  ? ? ?BP 116/68 (BP Location: Right Arm, Patient Position: Sitting, Cuff Size: Normal)   Pulse 76   Temp (!) 97.3 ?F (36.3 ?C) (Temporal)   Ht '5\' 6"'$  (1.676 m)   Wt 187 lb (84.

## 2021-11-02 LAB — CBC
HCT: 37.4 % — ABNORMAL LOW (ref 39.0–52.0)
Hemoglobin: 12.2 g/dL — ABNORMAL LOW (ref 13.0–17.0)
MCHC: 32.7 g/dL (ref 30.0–36.0)
MCV: 94.1 fl (ref 78.0–100.0)
Platelets: 195 10*3/uL (ref 150.0–400.0)
RBC: 3.98 Mil/uL — ABNORMAL LOW (ref 4.22–5.81)
RDW: 14.9 % (ref 11.5–15.5)
WBC: 6.3 10*3/uL (ref 4.0–10.5)

## 2021-11-02 LAB — COMPREHENSIVE METABOLIC PANEL
ALT: 7 U/L (ref 0–53)
AST: 16 U/L (ref 0–37)
Albumin: 3.8 g/dL (ref 3.5–5.2)
Alkaline Phosphatase: 62 U/L (ref 39–117)
BUN: 15 mg/dL (ref 6–23)
CO2: 30 mEq/L (ref 19–32)
Calcium: 9 mg/dL (ref 8.4–10.5)
Chloride: 105 mEq/L (ref 96–112)
Creatinine, Ser: 0.67 mg/dL (ref 0.40–1.50)
GFR: 84.36 mL/min (ref >60.00)
Glucose, Bld: 126 mg/dL — ABNORMAL HIGH (ref 70–99)
Potassium: 4.3 mEq/L (ref 3.5–5.1)
Sodium: 139 mEq/L (ref 135–145)
Total Bilirubin: 0.3 mg/dL (ref 0.2–1.2)
Total Protein: 6 g/dL (ref 6.0–8.3)

## 2021-11-02 LAB — LIPID PANEL
Cholesterol: 101 mg/dL (ref 0–200)
HDL: 45.2 mg/dL (ref >39.00)
LDL Cholesterol: 36 mg/dL (ref 0–99)
NonHDL: 55.32
Total CHOL/HDL Ratio: 2
Triglycerides: 99 mg/dL (ref 0.0–149.0)
VLDL: 19.8 mg/dL (ref 0.0–40.0)

## 2021-11-09 ENCOUNTER — Other Ambulatory Visit: Payer: Self-pay | Admitting: Family Medicine

## 2021-11-09 DIAGNOSIS — G4709 Other insomnia: Secondary | ICD-10-CM

## 2021-11-10 ENCOUNTER — Other Ambulatory Visit: Payer: Self-pay | Admitting: Family Medicine

## 2021-11-10 DIAGNOSIS — I6300 Cerebral infarction due to thrombosis of unspecified precerebral artery: Secondary | ICD-10-CM

## 2021-11-11 ENCOUNTER — Other Ambulatory Visit: Payer: Self-pay | Admitting: Family Medicine

## 2021-11-11 ENCOUNTER — Other Ambulatory Visit: Payer: Self-pay

## 2021-11-11 DIAGNOSIS — I10 Essential (primary) hypertension: Secondary | ICD-10-CM

## 2021-11-11 DIAGNOSIS — G894 Chronic pain syndrome: Secondary | ICD-10-CM

## 2021-11-11 NOTE — Telephone Encounter (Signed)
Caller Name: jayan raymundo ?Call back phone #: [phone] ? ?MEDICATION(S): QUEtiapine (SEROQUEL) 50 MG tablet [[id number]]. oxyCODONE-acetaminophen (PERCOCET) 7.5-325 MG tablet [[id number]] and losartan (COZAAR) 25 MG tablet [[id number]]  ? ? ?Days of Med Remaining:  ? ?Has the patient contacted their pharmacy (YES/NO)?  yes ?IF YES, when and what did the pharmacy advise? Call your pcp ?IF NO, request that the patient contact the pharmacy for the refills in the future.  ?           The pharmacy will send an electronic request (except for controlled medications). ? ?Preferred Pharmacy: [facility] [place] [address] [facility] [place]  ?[address], [place] [address]  ?Phone:  [phone]  Fax:  [phone]  ?DEA #:  [id number] ? ?~~~Please advise patient/caregiver to allow 2-3 business days to process RX refills. ? ?

## 2021-11-11 NOTE — Telephone Encounter (Signed)
Called spoke with pharmacist requested prescriptions was picked up from pharmacy yesterday.  ?

## 2021-11-13 DIAGNOSIS — Z20822 Contact with and (suspected) exposure to covid-19: Secondary | ICD-10-CM | POA: Diagnosis not present

## 2021-11-14 ENCOUNTER — Encounter: Payer: Self-pay | Admitting: Family Medicine

## 2021-11-14 ENCOUNTER — Other Ambulatory Visit: Payer: Self-pay | Admitting: Family Medicine

## 2021-11-14 ENCOUNTER — Telehealth: Payer: Self-pay | Admitting: Family Medicine

## 2021-11-14 DIAGNOSIS — G894 Chronic pain syndrome: Secondary | ICD-10-CM

## 2021-11-14 MED ORDER — OXYCODONE-ACETAMINOPHEN 7.5-325 MG PO TABS: 1.0000 | ORAL_TABLET | Freq: Three times a day (TID) | ORAL | 0 refills | Status: DC | PRN

## 2021-11-14 NOTE — Addendum Note (Signed)
Addended by: Lynda Rainwater on: 11/14/2021 08:56 AM ? ? Modules accepted: Orders ? ?

## 2021-11-14 NOTE — Telephone Encounter (Signed)
Refill request for pending Rx last RF 09/09/21 last OV 11/01/21 patient completley out. Please advise  ?

## 2021-11-14 NOTE — Telephone Encounter (Signed)
Pt called requesting the oxycodone.  ?

## 2021-11-15 ENCOUNTER — Other Ambulatory Visit: Payer: Self-pay | Admitting: Family Medicine

## 2021-11-15 DIAGNOSIS — G894 Chronic pain syndrome: Secondary | ICD-10-CM

## 2021-11-15 MED ORDER — OXYCODONE-ACETAMINOPHEN 7.5-325 MG PO TABS: 1.0000 | ORAL_TABLET | Freq: Three times a day (TID) | ORAL | 0 refills | Status: DC | PRN

## 2021-11-15 NOTE — Telephone Encounter (Signed)
Pts daughter called stated his med oxycodone is out and their pharmacy is out of stock. She called around and found [facility] on [address] have it in [place] can you please call it in there. # [phone] She stated he is in a lot of pain. ?

## 2021-11-15 NOTE — Telephone Encounter (Signed)
Please advise message below patients daughter calling to see if Rx could be sent to [facility] on [address]?  ?

## 2021-11-16 NOTE — Telephone Encounter (Signed)
Looks like new Rx was filled yesterday, this is a duplicate request.  ?

## 2021-11-16 NOTE — Telephone Encounter (Signed)
Rx sent to requested pharmacy

## 2021-11-16 NOTE — Telephone Encounter (Signed)
error 

## 2021-11-18 DIAGNOSIS — E785 Hyperlipidemia, unspecified: Secondary | ICD-10-CM | POA: Diagnosis not present

## 2021-11-18 DIAGNOSIS — F419 Anxiety disorder, unspecified: Secondary | ICD-10-CM | POA: Diagnosis not present

## 2021-11-18 DIAGNOSIS — L89159 Pressure ulcer of sacral region, unspecified stage: Secondary | ICD-10-CM | POA: Diagnosis not present

## 2021-11-18 DIAGNOSIS — K219 Gastro-esophageal reflux disease without esophagitis: Secondary | ICD-10-CM | POA: Diagnosis not present

## 2021-12-01 ENCOUNTER — Telehealth: Payer: Self-pay | Admitting: Family Medicine

## 2021-12-01 NOTE — Telephone Encounter (Signed)
Patients daughter aware he will need to be seen at ER ?

## 2021-12-01 NOTE — Telephone Encounter (Signed)
Daughter called and is giving a report from his nurse hje only had 50 cc's of urine today. She requested a phone call to speak about it. ?

## 2021-12-01 NOTE — Telephone Encounter (Signed)
Returned patients daughter call who reports that patient have on had 50cc of urine out put in the last 3.5 hours and complains of mild flank pain. Family not sure if they should keep an eye on patient for the next 24 hours or if he should schedule an appointment. Please advise.  ?

## 2021-12-02 NOTE — Telephone Encounter (Signed)
Pt's daughter(Spandorfer,Roxanne) is calling in requesting a referral for pt to a urologist for this issue. He does not want to go anywhere. Please advise Roxanne at [phone]. ?

## 2021-12-02 NOTE — Telephone Encounter (Signed)
Spoke with patients daughter who states that patient did not want to go to ER yesterday but he have decided this afternoon that he will go. Roxanne verbally understood this would be best for patient since he is possible still having the same symptoms as yesterday with no urine output.  ?

## 2021-12-04 ENCOUNTER — Other Ambulatory Visit: Payer: Self-pay | Admitting: Family Medicine

## 2021-12-04 DIAGNOSIS — K219 Gastro-esophageal reflux disease without esophagitis: Secondary | ICD-10-CM

## 2021-12-05 ENCOUNTER — Telehealth: Payer: Self-pay | Admitting: Family Medicine

## 2021-12-05 ENCOUNTER — Encounter: Payer: Self-pay | Admitting: Family Medicine

## 2021-12-05 NOTE — Telephone Encounter (Signed)
Pt requesting referral to Urologist.  ?

## 2021-12-05 NOTE — Telephone Encounter (Signed)
Returned patients call and to inform that I had let his daughter know that he would to be seen in the office before referral due to symptoms and refusal to go to ER. No answer unable to LM.  ?

## 2021-12-06 ENCOUNTER — Ambulatory Visit (INDEPENDENT_AMBULATORY_CARE_PROVIDER_SITE_OTHER): Payer: Medicare Other | Admitting: Family Medicine

## 2021-12-06 ENCOUNTER — Encounter: Payer: Self-pay | Admitting: Family Medicine

## 2021-12-06 ENCOUNTER — Other Ambulatory Visit: Payer: Self-pay | Admitting: Family Medicine

## 2021-12-06 VITALS — BP 119/82 | HR 66 | Temp 97.5°F | Ht 66.0 in | Wt 212.2 lb

## 2021-12-06 DIAGNOSIS — F419 Anxiety disorder, unspecified: Secondary | ICD-10-CM

## 2021-12-06 DIAGNOSIS — R339 Retention of urine, unspecified: Secondary | ICD-10-CM

## 2021-12-06 NOTE — Progress Notes (Signed)
? ?  Acute Office Visit ? ?Subjective:  ? ?  ?Patient ID: Brian [name], male    DOB: [birth year], 88 y.o.   MRN: [id number] ? ?Chief Complaint  ?Patient presents with  ? Acute Visit  ?  Pt c/o to get a referral to a Urologist.  ? ? ?HPI ?Patient is in today for decreased urinary output over the past 8 hours.  Patient with a history of chronic urinary retention with daily catheterization with bag.  He on average puts out 1400 mL of urine overnight, which she did last night.  Patient had urine output of about 200 mL from 8-1.  Multiple attempts to urinate in the office today but was unable to.  Michela Pitcher initially he felt some burning and some lower back pain, but this has resolved after increasing his p.o. fluid intake and taking cranberry supplements.  Per caregiver and home care assistant patient is at baseline.  Denies any fevers, last temperature taken last night was 98-99 Fahrenheit.  Patient is accompanied by son and home care aide. ? ?ROS ?As per HPI ? ?   ?Objective:  ?  ?BP 119/82 (BP Location: Right Arm, Patient Position: Sitting, Cuff Size: Normal)   Pulse 66   Temp (!) 97.5 ?F (36.4 ?C) (Temporal)   Ht '5\' 6"'$  (1.676 m)   Wt 212 lb 3.2 oz (96.3 kg)   SpO2 94%   BMI 34.25 kg/m?  ?Gen: NAD, resting comfortably, wheelchair-bound ?CV: RRR with no murmurs appreciated ?Pulm: NWOB, CTAB with no crackles, wheezes, or rhonchi ?GI: Normal bowel sounds present. Soft, Nontender, Nondistended.,  No flank tenderness ?Skin: warm, dry ?Neuro: grossly normal, moves all extremities ?Psych: Normal affect and thought content  ?Physical Exam ? ?No results found for any visits on 12/06/21. ? ? ?   ?Assessment & Plan:  ? ?Problem List Items Addressed This Visit   ? ?  ? Genitourinary  ? Urinary retention - Primary  ?  Patient with history of chronic urinary retention with catheterization, likely due to BPH.  Does not follow with urology.  He has him on multiple prostate agents including tamsulosin 0.4 mg and finasteride 5 mg.   Although urine output has reduced over the past 8 hours, consistently including overnight has had grossly normal urine output averaging at least 1.2 to 1.4 L overnight including last night.  Subjectively patient is improving, although he did have some symptoms of possible UTI, which is definitely possible given his recurrent catheterization.  Despite multiple tips, was unable to get urine to do a UA/urine culture.   ?Urine specimen cup given to patient, ?We will get UA and urine culture tomorrow a.m. when patient is able to bring these back. ?Return precautions discussed with patient and heavily stressed the need to go to the emergency department if symptoms worsen, patient was hesitant to do this despite warnings ?Pending labs would treat accordingly ? ?  ?  ? ? ?No orders of the defined types were placed in this encounter. ? ? ?Return in about 1 day (around 12/07/2021). ? ?Bonnita Hollow, MD ? ? ?

## 2021-12-06 NOTE — Assessment & Plan Note (Signed)
Patient with history of chronic urinary retention with catheterization, likely due to BPH.  Does not follow with urology.  He has him on multiple prostate agents including tamsulosin 0.4 mg and finasteride 5 mg.  Although urine output has reduced over the past 8 hours, consistently including overnight has had grossly normal urine output averaging at least 1.2 to 1.4 L overnight including last night.  Subjectively patient is improving, although he did have some symptoms of possible UTI, which is definitely possible given his recurrent catheterization.  Despite multiple tips, was unable to get urine to do a UA/urine culture.   ?Urine specimen cup given to patient, ?We will get UA and urine culture tomorrow a.m. when patient is able to bring these back. ?Return precautions discussed with patient and heavily stressed the need to go to the emergency department if symptoms worsen, patient was hesitant to do this despite warnings ?Pending labs would treat accordingly ?

## 2021-12-06 NOTE — Patient Instructions (Signed)
Please bring the urine back in the morning.  So that we may do a test to look for infection.  Please go to the emergency department if you do not have normal urine output in the morning, this for you is around 1 to 1/2 L, you develop back pain, fevers, severe burning, or any other concerning symptoms ?

## 2021-12-07 ENCOUNTER — Other Ambulatory Visit (INDEPENDENT_AMBULATORY_CARE_PROVIDER_SITE_OTHER): Payer: Medicare Other

## 2021-12-07 ENCOUNTER — Telehealth: Payer: Self-pay | Admitting: Family Medicine

## 2021-12-07 DIAGNOSIS — R339 Retention of urine, unspecified: Secondary | ICD-10-CM

## 2021-12-07 DIAGNOSIS — R8290 Unspecified abnormal findings in urine: Secondary | ICD-10-CM | POA: Diagnosis not present

## 2021-12-07 DIAGNOSIS — R300 Dysuria: Secondary | ICD-10-CM

## 2021-12-07 LAB — URINALYSIS, ROUTINE W REFLEX MICROSCOPIC
Bilirubin Urine: NEGATIVE
Ketones, ur: NEGATIVE
Nitrite: POSITIVE — AB
Specific Gravity, Urine: 1.01 (ref 1.000–1.030)
Total Protein, Urine: NEGATIVE
Urine Glucose: NEGATIVE
Urobilinogen, UA: 0.2 (ref 0.0–1.0)
pH: 6 (ref 5.0–8.0)

## 2021-12-07 NOTE — Telephone Encounter (Signed)
Pt's daughter Brian [name] feels he needs an antibiotic because of his lab results he just got today. Please advise Brian [name] at [phone] ? ? ? ?

## 2021-12-08 LAB — URINE CULTURE
MICRO NUMBER:: 13284598
SPECIMEN QUALITY:: ADEQUATE

## 2021-12-12 ENCOUNTER — Encounter (HOSPITAL_BASED_OUTPATIENT_CLINIC_OR_DEPARTMENT_OTHER): Payer: Self-pay | Admitting: Cardiovascular Disease

## 2021-12-12 ENCOUNTER — Telehealth: Payer: Self-pay | Admitting: Cardiovascular Disease

## 2021-12-12 ENCOUNTER — Encounter: Payer: Self-pay | Admitting: Family Medicine

## 2021-12-12 NOTE — Telephone Encounter (Signed)
Pt c/o swelling: STAT is pt has developed SOB within 24 hours ? ?If swelling, where is the swelling located? Stomach and legs and feet  ? ?How much weight have you gained and in what time span? 185 last week and 212 this week  ? ?Have you gained 3 pounds in a day or 5 pounds in a week? Na  ? ?Do you have a log of your daily weights (if so, list)? No  ? ?Are you currently taking a fluid pill? Yes,  ? ?Are you currently SOB? No.  Pt has been been having a hard to time urinating.     ? ?Have you traveled recently? No  ? ?Best number [phone]  ?

## 2021-12-12 NOTE — Telephone Encounter (Signed)
Patient scheduled 12/14/2021 to see Blima Ledger NP for weight gain/swelling  ?

## 2021-12-12 NOTE — Telephone Encounter (Signed)
Called by Alvina Filbert, LPN and patient is scheduled to see Coletta Memos, NP on 4/26 at 10:05am  ?

## 2021-12-12 NOTE — Telephone Encounter (Signed)
Please advise 

## 2021-12-12 NOTE — Telephone Encounter (Signed)
Spoke with patient and he does have swelling in his ankles and feet ?Scheduled appointment for 4/26, patient aware  ?

## 2021-12-13 NOTE — Progress Notes (Deleted)
Cardiology Office Note:    Date:  12/13/2021   ID:  Brian [name], Brian [name] 04, 1934, MRN [id number]  PCP:  Libby Maw, MD   [place] Providers Cardiologist:  Skeet Latch, MD { Click to update primary MD,subspecialty MD or APP then REFRESH:1}    Referring MD: Libby Maw   Presents to the clinic today for evaluation of his weight gain.  History of Present Illness:    Brian [name] is a 88 y.o. male with a hx of essential hypertension, atrial flutter, coronary atherosclerosis, CVA, paroxysmal atrial fibrillation, venous ulcer, osteoarthritis right knee, BPH, recurrent epistaxis, anxiety, hyperlipidemia, obesity, and insomnia.  His PMH also includes GI bleeds and mitral valve repair.  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  He underwent embolectomy.  His INR was 2.8 on admission.  He was switched to Eliquis 2.5 mg twice daily.  He developed a gastric ulcer in 2002 which required transfusion.  He underwent mitral valve repair at [facility] in 2002.  His echocardiogram 9/17 showed an LVEF of 55-60% and mildly elevated pulmonary pressures.  His echocardiogram 3/11 showed normal systolic function, mild aortic valve regurgitation and mild mitral regurgitation.  He was seen by Dr. Stanford Breed 6/14 and was felt to be in atrial flutter.  He was rate controlled and was asymptomatic.  He was seen in follow-up by Dr. Oval Linsey on 06/21/2021.  During that time he was feeling well and had been working on losing weight.  He was in atrial fibrillation but was asymptomatic and rate controlled.  He presented with 2 family members.  He reported not feeling well.  At home his blood pressures were averaging 120s over 70s.  He had gained 30 pounds.  He was having a difficult time losing weight.  His daughter thought he was too frail and encouraged him to eat junk food.  For activity he  was trying to lift weights for about 20 minutes 4 times per week.  He was also using a QB stationary pedaling machine at home.  He was eating most of his meals at home and enjoyed eating salmon regularly.  He denied recent falls.  He reported occasional constipation.  He was using Metamucil tablets which caused subsequent diarrhea.  He also complained of left knee pain.  He reported sensitivity with palpitation to his right lower extremity.  He denied chest pain, shortness of breath, lightheadedness, headaches, syncope, orthopnea and PND.  He presents to the clinic today for follow-up evaluation states***  *** denies chest pain, shortness of breath, lower extremity edema, fatigue, palpitations, melena, hematuria, hemoptysis, diaphoresis, weakness, presyncope, syncope, orthopnea, and PND.     Past Medical History:  Diagnosis Date   Anemia    Atrial flutter (HCC)    BPH (benign prostatic hypertrophy)    Epistaxis    Hemorrhage of gastrointestinal tract, unspecified    History of open heart surgery    Hypertension    Obesity (BMI 30.0-34.9) 06/29/2020   Osteoarthrosis, unspecified whether generalized or localized, unspecified site    Personal history of venous thrombosis and embolism    Rosacea    Stroke [facility])    TIA (transient ischemic attack)     Past Surgical History:  Procedure Laterality Date   APPENDECTOMY     HERNIA REPAIR     IR GENERIC HISTORICAL  05/04/2016   IR PERCUTANEOUS ART THROMBECTOMY/INFUSION INTRACRANIAL INC DIAG  ANGIO 05/04/2016 [name], MD [facility]   IR GENERIC HISTORICAL  05/04/2016   IR ANGIO VERTEBRAL SEL SUBCLAVIAN INNOMINATE UNI R MOD SED 05/04/2016 [name], MD [facility]   IR GENERIC HISTORICAL  06/07/2016   IR RADIOLOGIST EVAL & MGMT 06/07/2016 [facility]   JOINT REPLACEMENT     MITRAL VALVE REPAIR     mvp repair     RADIOLOGY WITH ANESTHESIA N/A 05/04/2016   Procedure: RADIOLOGY WITH ANESTHESIA;  Surgeon: [name],  MD;  Location: [place];  Service: Radiology;  Laterality: N/A;   TOTAL HIP ARTHROPLASTY      Current Medications: No outpatient medications have been marked as taking for the 12/14/21 encounter (Appointment) with Deberah Pelton, NP.     Allergies:   Novocain [procaine hcl], Other, and Penicillins   Social History   Socioeconomic History   Marital status: Married    Spouse name: Not on file   Number of children: Not on file   Years of education: Not on file   Highest education level: Not on file  Occupational History   Not on file  Tobacco Use   Smoking status: Former   Smokeless tobacco: Never   Tobacco comments:    quit 70 yr ago  Vaping Use   Vaping Use: Never used  Substance and Sexual Activity   Alcohol use: No   Drug use: No   Sexual activity: Not on file  Other Topics Concern   Not on file  Social History Narrative   Not on file   Social Determinants of Health   Financial Resource Strain: Low Risk    Difficulty of Paying Living Expenses: Not hard at all  Food Insecurity: Not on file  Transportation Needs: Not on file  Physical Activity: Not on file  Stress: Not on file  Social Connections: Not on file     Family History: The patient's ***family history includes Coronary artery disease in his father; Rheumatic fever in his mother.  ROS:   Please see the history of present illness.    *** All other systems reviewed and are negative.   Risk Assessment/Calculations:   {Does this patient have ATRIAL FIBRILLATION?[phone]}       Physical Exam:    VS:  There were no vitals taken for this visit.    Wt Readings from Last 3 Encounters:  12/06/21 212 lb 3.2 oz (96.3 kg)  11/01/21 187 lb (84.8 kg)  08/02/21 178 lb (80.7 kg)     GEN: *** Well nourished, well developed in no acute distress HEENT: Normal NECK: No JVD; No carotid bruits LYMPHATICS: No lymphadenopathy CARDIAC: ***RRR, no murmurs, rubs, gallops RESPIRATORY:  Clear to auscultation  without rales, wheezing or rhonchi  ABDOMEN: Soft, non-tender, non-distended MUSCULOSKELETAL:  No edema; No deformity  SKIN: Warm and dry NEUROLOGIC:  Alert and oriented x 3 PSYCHIATRIC:  Normal affect    EKGs/Labs/Other Studies Reviewed:    The following studies were reviewed today:    Echocardiogram 05/06/2016  Study Conclusions   - Left ventricle: The cavity size was normal. Wall thickness was    normal. Systolic function was normal. The estimated ejection    fraction was in the range of 55% to 60%. Wall motion was normal;    there were no regional wall motion abnormalities. Mitral    annuloplasty precludes evaluation of LV diastolic function.  - Ventricular septum: Septal motion showed paradox. These changes    are consistent with a post-thoracotomy state.  -  Mitral valve: Prior procedures included surgical repair. An    annular ring prosthesis was present and functioning normally.    Valve area by pressure half-time: 2.47 cm^2.  - Left atrium: The atrium was mildly dilated.  - Pulmonary arteries: Systolic pressure was mildly increased. PA    peak pressure: 44 mm Hg (S).  Carotid ultrasound 02/05/2018  Final Interpretation:  Right Carotid: Velocities in the right ICA are consistent with a 1-39%  stenosis.   Left Carotid: Velocities in the left ICA are consistent with a 1-39%  stenosis.   Vertebrals: Bilateral vertebral arteries demonstrate antegrade flow.   *See table(s) above for measurements and observations.       Electronically signed by Antony Contras MD on 02/06/2018 at 10:51:24 AM.  EKG:  EKG is *** ordered today.  The ekg ordered today demonstrates ***  Recent Labs: 11/01/2021: ALT 7; BUN 15; Creatinine, Ser 0.67; Hemoglobin 12.2; Platelets 195.0; Potassium 4.3; Sodium 139  Recent Lipid Panel    Component Value Date/Time   CHOL 101 11/01/2021 1528   CHOL 134 04/16/2018 1155   TRIG 99.0 11/01/2021 1528   HDL 45.20 11/01/2021 1528   HDL 51 04/16/2018 1155    CHOLHDL 2 11/01/2021 1528   VLDL 19.8 11/01/2021 1528   LDLCALC 36 11/01/2021 1528   LDLCALC 55 04/16/2018 1155   LDLDIRECT 68 02/06/2020 1706    ASSESSMENT & PLAN    Coronary artery disease-denies recent episodes of arm neck back and chest discomfort.  Reviewed the importance of regular physical activity. Continue metoprolol, pravastatin Heart healthy low-sodium high-fiber diet Increase physical activity as tolerated  Essential hypertension-BP today***.  Well-controlled at home. Continue metoprolol, losartan Heart healthy low-sodium diet-salty 6 given Increase physical activity as tolerated  Persistent atrial fibrillation-EKG today shows***.  Denies recent episodes of accelerated or irregular heartbeat.  Cardiac unaware.  Reports compliance with apixaban and denies bleeding issues. Continue apixaban, metoprolol Heart healthy low-sodium diet-salty 6 given Increase physical activity as tolerated  Hyperlipidemia-LDL***.  Reports that he is not eating junk food and continues to work on losing weight.  No aspirin on apixaban Continue pravastatin Heart healthy low-sodium diet-salty 6 given Increase physical activity as tolerated  Disposition: Follow-up with Dr. Oval Linsey or APP in 6 months.  {Are you ordering a CV Procedure (e.g. stress test, cath, DCCV, TEE, etc)?   Press F2        :[id number]}    Medication Adjustments/Labs and Tests Ordered: Current medicines are reviewed at length with the patient today.  Concerns regarding medicines are outlined above.  No orders of the defined types were placed in this encounter.  No orders of the defined types were placed in this encounter.   There are no Patient Instructions on file for this visit.   Signed, Deberah Pelton, NP  12/13/2021 6:21 AM      Notice: This dictation was prepared with Dragon dictation along with smaller phrase technology. Any transcriptional errors that result from this process are unintentional and may not  be corrected upon review.  I spent***minutes examining this patient, reviewing medications, and using patient centered shared decision making involving her cardiac care.  Prior to her visit I spent greater than 20 minutes reviewing her past medical history,  medications, and prior cardiac tests.

## 2021-12-14 ENCOUNTER — Ambulatory Visit: Payer: Medicare Other | Admitting: General Practice

## 2021-12-14 ENCOUNTER — Ambulatory Visit (HOSPITAL_BASED_OUTPATIENT_CLINIC_OR_DEPARTMENT_OTHER): Payer: Medicare Other | Admitting: General Practice

## 2021-12-15 ENCOUNTER — Encounter (HOSPITAL_BASED_OUTPATIENT_CLINIC_OR_DEPARTMENT_OTHER): Payer: Self-pay | Admitting: Cardiovascular Disease

## 2021-12-16 ENCOUNTER — Telehealth (HOSPITAL_BASED_OUTPATIENT_CLINIC_OR_DEPARTMENT_OTHER): Payer: Self-pay | Admitting: Cardiovascular Disease

## 2021-12-16 NOTE — Telephone Encounter (Signed)
? ?  Pre-operative Risk Assessment  ?  ?Patient Name: Brian [name]  ?DOB: 07/20/1933 ?MRN: [id number]  ? ? ? ?Request for Surgical Clearance   ? ?Procedure:  Root Canal  ? ?Date of Surgery:  Clearance 12/22/21                              ?   ?Surgeon:  Dr. Lupe Carney ?Surgeon's Group or Practice Name:  [facility]  ?Phone number:  [phone] ?Fax number:  [phone] ?  ?Type of Clearance Requested:  medical  ? ?  ?Type of Anesthesia:  septocaine  ?  ?Additional requests/questions:  Patient just wanted to make sure he could have procedure done  ? ?Signed, ?Johnna Acosta   ?12/16/2021, 4:29 PM   ?

## 2021-12-19 NOTE — Telephone Encounter (Signed)
Please let dental office know that per most recent guidelines, "Clindamycin is no longer recommended as an alternative antibiotic regimen for patients undergoing dental procedures, given more frequent and severe adverse reactions associated with this drug compared with other antibiotic agents." Would suggest a one time dose of azithromycin '500mg'$  or doxycycline '100mg'$  as a one time dose 30-60 minutes prior to dental work. Will ultimately defer to dentist if they prefer clindamycin but just wanted to send an update of most recent recommendations on our end. If they prefer we rx, OK to send in under me, just make sure pt is aware. Thank you! ?

## 2021-12-19 NOTE — Telephone Encounter (Signed)
Callback, can you find out if the dentist will need to hold Eliquis for this procedure? ?

## 2021-12-19 NOTE — Telephone Encounter (Signed)
? ?  Patient Name: Brian [name]  ?DOB: [birth year] ?MRN: [id number] ? ?Primary Cardiologist: Skeet Latch, MD ? ?Chart reviewed as part of pre-operative protocol coverage.  ? ?We are asked for input by the patient to proceed with root canal this wek. Per discussion with dental office he is not required to hold his blood thinner for this procedure. I touched base with the patient who affirms he has not had any CP or SOB. There are notes in the system about weight gain/swelling but the patient denies this and does not wish to have a visit at this time. He is OK to proceed as requested. A root canal is considered a low risk procedure under local anesthesia. ? ?SBE prophylaxis is required for the patient from a cardiac standpoint. The patient states his dentist office mentioned this to him and will be giving him something but he could not recall what it is. I will route to callback to assist with calling dentist to ensure they are rx'ing SBE prophylaxis. Given penicillin allergy, would be OK for azithromycin '500mg'$  or doxycycline '100mg'$  as a one time dose 30-60 minutes prior to dental work. If dentist office has not sent in, OK to fill under me and let patient know. If dental office is sending in, please ask them to call patient to make sure he is aware of where to pick up and instructions for use. ? ?I'll also fax a copy of this note to requesting dentist. ? ? ?Charlie Pitter, PA-C ?12/19/2021, 10:44 AM ? ?

## 2021-12-19 NOTE — Telephone Encounter (Signed)
I s/w DDS office today and confirmed the pt will not need to hold his Eliquis. Per the dental office there is no bleeding issues generally involved in the procedure for a root canal. I assured I will update the pre op provider, so that we may be able to proceed and complete the clearance request for the pt.  ?

## 2021-12-19 NOTE — Telephone Encounter (Signed)
S/w Butch Penny with dental office. Butch Penny, states the DDS called in Clindamycin for the pt as SBE. I will fax over final clearance notes to requesting office.  ?

## 2021-12-20 NOTE — Telephone Encounter (Signed)
I s/w Butch Penny with Dr. Orland Mustard office. I informed their office that I am going to fax over notes with the clearance, however also in regard to the SBE Clindamycin that was called in. Per Butch Penny, Dr. Irineo Axon, DDS (pt's regular DDS) called in the Clindamycin. Butch Penny gave me # [phone].  ? ?I then s/w [place] with Dr. Romilda Garret, [place] office, I went over the recommendations from [facility], [facility] in regard to SBE. I informed both offices that I will fax over these notes to both locations. [place] with Dr. Romilda Garret office will address SBE. If they choose to opt for one of the other ABX listed in [facility], [facility] notes they will then call in and let the pt know.  ? ?I will remove from the pre op call back pool at this time.   ?

## 2021-12-26 ENCOUNTER — Encounter: Payer: Self-pay | Admitting: Family Medicine

## 2021-12-26 DIAGNOSIS — Z20822 Contact with and (suspected) exposure to covid-19: Secondary | ICD-10-CM | POA: Diagnosis not present

## 2021-12-27 ENCOUNTER — Ambulatory Visit
Payer: Medicare Other | Attending: Student in an Organized Health Care Education/Training Program | Admitting: Student in an Organized Health Care Education/Training Program

## 2021-12-27 ENCOUNTER — Encounter: Payer: Self-pay | Admitting: Student in an Organized Health Care Education/Training Program

## 2021-12-27 ENCOUNTER — Other Ambulatory Visit: Payer: Self-pay | Admitting: Family Medicine

## 2021-12-27 VITALS — BP 109/66 | HR 71 | Temp 98.0°F | Resp 16 | Ht 68.0 in | Wt 190.0 lb

## 2021-12-27 DIAGNOSIS — M1711 Unilateral primary osteoarthritis, right knee: Secondary | ICD-10-CM | POA: Diagnosis not present

## 2021-12-27 DIAGNOSIS — G894 Chronic pain syndrome: Secondary | ICD-10-CM | POA: Diagnosis not present

## 2021-12-27 DIAGNOSIS — G8929 Other chronic pain: Secondary | ICD-10-CM | POA: Insufficient documentation

## 2021-12-27 DIAGNOSIS — M170 Bilateral primary osteoarthritis of knee: Secondary | ICD-10-CM | POA: Diagnosis not present

## 2021-12-27 DIAGNOSIS — M47816 Spondylosis without myelopathy or radiculopathy, lumbar region: Secondary | ICD-10-CM | POA: Insufficient documentation

## 2021-12-27 DIAGNOSIS — M1712 Unilateral primary osteoarthritis, left knee: Secondary | ICD-10-CM | POA: Insufficient documentation

## 2021-12-27 DIAGNOSIS — M5136 Other intervertebral disc degeneration, lumbar region: Secondary | ICD-10-CM | POA: Insufficient documentation

## 2021-12-27 DIAGNOSIS — M48062 Spinal stenosis, lumbar region with neurogenic claudication: Secondary | ICD-10-CM | POA: Insufficient documentation

## 2021-12-27 DIAGNOSIS — M5416 Radiculopathy, lumbar region: Secondary | ICD-10-CM | POA: Diagnosis not present

## 2021-12-27 MED ORDER — OXYCODONE-ACETAMINOPHEN 7.5-325 MG PO TABS: 1.0000 | ORAL_TABLET | Freq: Three times a day (TID) | ORAL | 0 refills | Status: DC | PRN

## 2021-12-27 NOTE — Telephone Encounter (Signed)
Refill request for pending Rx last refill 11/15/21 last Ov with Dr. Grandville Silos 12/06/21 last OV with Dr. Ethelene Hal 11/01/21. Please advise  ?

## 2021-12-27 NOTE — Progress Notes (Signed)
Patient: Brian [name]  Service Category: E/M  Provider: Gillis Santa, MD  ?DOB: 1932/11/22  DOS: 12/27/2021  Referring Provider: Libby Maw  ?MRN: [id number]  Setting: Ambulatory outpatient  PCP: Libby Maw, MD  ?Type: New Patient  Specialty: Interventional Pain Management    ?Location: Office  Delivery: Face-to-face    ? ?Primary Reason(s) for Visit: Encounter for initial evaluation of one or more chronic problems (new to examiner) potentially causing chronic pain, and posing a threat to normal musculoskeletal function. (Level of risk: High) ?CC: Joint Pain (Shoulders, knees bilat are the worst) ? ?HPI  ?Brian [name] is a 88 y.o. year old, male patient, who comes for the first time to our practice referred by Libby Maw,* for our initial evaluation of his chronic pain. He has OTHER AND UNSPECIFIED MITRAL VALVE DISEASES; Essential hypertension; Coronary atherosclerosis; ATRIAL FLUTTER; PEPTIC ULCER DISEASE; BENIGN PROSTATIC HYPERTROPHY; ROSACEA; ACTINIC KERATOSIS, HEAD; Osteoarthritis; ROTATOR CUFF SYNDROME, LEFT; EPISTAXIS, RECURRENT; UNS ADVRS EFF UNS RX MEDICINAL&BIOLOGICAL SBSTNC; DVT, HX OF; History of mitral valve repair; HIP REPLACEMENT, TOTAL, HX OF; APPENDECTOMY, HX OF; HERNIORRHAPHY, HX OF; OTH MALIG NEOPLASM SKIN OTH&UNSPEC PARTS FACE; Scoliosis (and kyphoscoliosis), idiopathic; Cerebrovascular accident (CVA) due to thrombosis of precerebral artery ([place]); Coronary artery disease involving native coronary artery of native heart without angina pectoris; Persistent atrial fibrillation ([place]); Dysarthria, post-stroke; Dysphagia, post-stroke; Pain in joint of left shoulder; Arthralgia of hip; Lumbar radiculopathy; Osteoarthritis of hip; Osteoarthritis of left glenohumeral joint; Pain in left foot; Sacroiliitis ([place]); Anxiety; Hyperlipidemia; Gait disturbance; Serum potassium elevated; Elevated glucose; Left ear pain; Pressure injury of right buttock, stage 1; Debilitated;  Venous ulcer, limited to breakdown of skin ([place]); Swelling of lower extremity; Acute non-recurrent sinusitis; Need for influenza vaccination; Paroxysmal atrial fibrillation ([place]); Benign prostatic hyperplasia with urinary hesitancy; Obesity (BMI 30.0-34.9); Paraplegic immobility syndrome; Cellulitis of left upper extremity; Pressure injury of left elbow, stage 1; Other insomnia; Pressure injury of skin of sacral region; Pressure ulcer of left buttock, stage 2 ([place]); Noise effect on both inner ears; Presbycusis of both ears; History of cerebrovascular accident; Osteoarthritis of right knee; Depression with anxiety; and Urinary retention on their problem list. Today he comes in for evaluation of his Joint Pain (Shoulders, knees bilat are the worst) ? ?Pain Assessment: ?Location:   Other (Comment) (joint pain) ?Radiating:   ?Onset: More than a month ago ?Duration: Chronic pain ?Quality: Aching ?Severity: 5 /10 (subjective, self-reported pain score)  ?Effect on ADL:   ?Timing: Constant ?Modifying factors: nothing ?BP: 109/66  HR: 71 ? ?Onset and Duration: Gradual ?Cause of pain:  stroke ?Severity: Getting worse, NAS-11 at its worse: 7/10, NAS-11 at its best: 5/10, NAS-11 now: 5/10, and NAS-11 on the average: 5/10 ?Timing: Morning, Afternoon, and Night ?Aggravating Factors: Bending, Bowel movements, Motion, and Prolonged sitting ?Alleviating Factors: Hot packs, Medications, Resting, Sleeping, and cbd gummies ?Associated Problems: Constipation, Depression, Fatigue, Inability to concentrate, Inability to control bladder (urine), Numbness, Sadness, Swelling, and Weakness ?Quality of Pain: Aching, Agonizing, Constant, Disabling, Nagging, Tender, Throbbing, Tiring, Toothache-like, and Uncomfortable ?Previous Examinations or Tests: Endoscopy, Neurological evaluation, and Orthopedic evaluation ?Previous Treatments: Narcotic medications, Physical Therapy, and Stretching exercises ? ?Brian [name] is a pleasant 88 year old male who  is accompanied today by his son and his nursing aide.  He has a longstanding history of diffuse arthritis most pronounced in his glenohumeral joints and knees.  He has tried physical therapy and continues to engage in participate in physical therapy twice a week.  He  has a history of stroke with associated hemiparesis.  He has significant thoracic kyphosis.  He has difficulty ambulating and presents today in a wheelchair.  He also has bilateral hip osteoarthritis.  He was previously on Percocet 5 mg twice a day which became less effective and as a result, his primary care provider increased it to 7.5 mg twice a day which he is not getting any benefit from.  He is currently anticoagulated on Eliquis for his coronary artery disease.  He is also on Lyrica 25 mg twice a day.  He is also on Seroquel and Zoloft for depression management. ? ? ?Historic Controlled Substance Pharmacotherapy Review  ? ?12/21/2021  09/09/2021   1  Pregabalin 25 Mg Capsule 90.00  30  [place]  [id number]   Wal ([id number])  1/1  0.50 LME  Medicare  [place]   ? 11/15/2021  11/15/2021   1  Oxycodone-Acetaminophn 7.5-325 90.00  30  Wi Kre  [id number]   Wal ([id number])  0/0  33.75 MME  Medicare  [place]   ? ? ?Historical Monitoring: ?The patient  reports no history of drug use. ?List of all UDS Test(s): ?No results found for: MDMA, COCAINSCRNUR, [place], [place], [place], [place], [place] ?List of other Serum/Urine Drug Screening Test(s):  ?No results found for: AMPHSCRSER, [place], [place], [place], [place], [place], [place], [place], [place], [place], [place], [place], [place], [place] ?Historical Background Evaluation: ?[place] PMP: PDMP reviewed during this encounter. Online review of the past 14-monthperiod conducted.             ?[place] Department of public safety, offender search: (Editor, commissioningInformation) Non-contributory ?Risk Assessment Profile: ?Aberrant behavior: None observed or detected today ?Risk factors for fatal opioid overdose: None  identified today ?Fatal overdose hazard ratio (HR): Calculation deferred ?Non-fatal overdose hazard ratio (HR): Calculation deferred ?Risk of opioid abuse or dependence: 0.7-3.0% with doses ? 36 MME/day and 6.1-26% with doses ? 120 MME/day. ?Substance use disorder (SUD) risk level: See below ?Personal History of Substance Abuse (SUD-Substance use disorder):  ?Alcohol: Negative  ?Illegal Drugs: Negative  ?Rx Drugs: Negative  ?ORT Risk Level calculation: Low Risk ? Opioid Risk Tool - 12/27/21 1355   ? ?  ? Family History of Substance Abuse  ? Alcohol Negative   ? Illegal Drugs Negative   ? Rx Drugs Negative   ?  ? Personal History of Substance Abuse  ? Alcohol Negative   ? Illegal Drugs Negative   ? Rx Drugs Negative   ?  ? Age  ? Age between 176-45years  No   ?  ? History of Preadolescent Sexual Abuse  ? History of Preadolescent Sexual Abuse Negative or Male   ?  ? Psychological Disease  ? Psychological Disease Negative   ? Depression Negative   ?  ? Total Score  ? Opioid Risk Tool Scoring 0   ? Opioid Risk Interpretation Low Risk   ? ?  ?  ? ?  ? ?ORT Scoring interpretation table:  ?Score <3 = Low Risk for SUD  ?Score between 4-7 = Moderate Risk for SUD  ?Score >8 = High Risk for Opioid Abuse  ? ?PHQ-2 Depression Scale:  Total score:   ? ?PHQ-2 Scoring interpretation table: (Score and probability of major depressive disorder)  ?Score 0 = No depression  ?Score 1 = 15.4% Probability  ?Score 2 = 21.1% Probability  ?Score 3 = 38.4% Probability  ?Score 4 = 45.5% Probability  ?Score 5 = 56.4% Probability  ?Score 6 = 78.6%  Probability  ? ?PHQ-9 Depression Scale:  Total score:   ? ?PHQ-9 Scoring interpretation table:  ?Score 0-4 = No depression  ?Score 5-9 = Mild depression  ?Score 10-14 = Moderate depression  ?Score 15-19 = Moderately severe depression  ?Score 20-27 = Severe depression (2.4 times higher risk of SUD and 2.89 times higher risk of overuse)  ? ?Pharmacologic Plan: As per protocol, I have not taken over any  controlled substance management, pending the results of ordered tests and/or consults.            ?Initial impression: Pending review of available data and ordered tests. ? ?Meds  ? ?Current Outpatient Medications:

## 2021-12-27 NOTE — Progress Notes (Signed)
Safety precautions to be maintained throughout the outpatient stay will include: orient to surroundings, keep bed in low position, maintain call bell within reach at all times, provide assistance with transfer out of bed and ambulation.  

## 2021-12-27 NOTE — Telephone Encounter (Signed)
Pts son called, pt is low on his oxycodone and needs his refill sent in asap. ?

## 2021-12-30 ENCOUNTER — Other Ambulatory Visit: Payer: Self-pay

## 2021-12-30 ENCOUNTER — Telehealth: Payer: Self-pay | Admitting: Family Medicine

## 2021-12-30 DIAGNOSIS — G894 Chronic pain syndrome: Secondary | ICD-10-CM

## 2021-12-30 NOTE — Telephone Encounter (Signed)
Pt refill request sent in to provider to be approved.  ?

## 2021-12-30 NOTE — Telephone Encounter (Signed)
Pt needs oxycodone redirected to [facility] on [address].[place].  ?[place]

## 2022-01-03 LAB — COMPLIANCE DRUG ANALYSIS, UR

## 2022-01-08 ENCOUNTER — Other Ambulatory Visit: Payer: Self-pay | Admitting: Family Medicine

## 2022-01-09 DIAGNOSIS — R300 Dysuria: Secondary | ICD-10-CM | POA: Diagnosis not present

## 2022-01-09 DIAGNOSIS — N481 Balanitis: Secondary | ICD-10-CM | POA: Diagnosis not present

## 2022-01-10 ENCOUNTER — Ambulatory Visit: Payer: Medicare Other | Attending: Cardiovascular Disease | Admitting: Physical Therapy

## 2022-01-10 DIAGNOSIS — G8190 Hemiplegia, unspecified affecting unspecified side: Secondary | ICD-10-CM | POA: Insufficient documentation

## 2022-01-10 DIAGNOSIS — M6281 Muscle weakness (generalized): Secondary | ICD-10-CM | POA: Diagnosis not present

## 2022-01-10 DIAGNOSIS — I679 Cerebrovascular disease, unspecified: Secondary | ICD-10-CM | POA: Insufficient documentation

## 2022-01-10 DIAGNOSIS — M25512 Pain in left shoulder: Secondary | ICD-10-CM | POA: Diagnosis not present

## 2022-01-10 DIAGNOSIS — L89159 Pressure ulcer of sacral region, unspecified stage: Secondary | ICD-10-CM | POA: Diagnosis not present

## 2022-01-10 DIAGNOSIS — I10 Essential (primary) hypertension: Secondary | ICD-10-CM | POA: Diagnosis not present

## 2022-01-10 NOTE — Therapy (Signed)
OUTPATIENT PHYSICAL THERAPY NEURO EVALUATION   Patient Name: DELVON CHIPPS MRN: [id number] DOB:12-01-1932, 88 y.o., male Today's Date: 01/10/2022   PCP: Libby Maw, MD REFERRING PROVIDER: Skeet Latch, MD    PT End of Session - 01/10/22 1440     Visit Number 1    Authorization Type Medicare A & B    PT Start Time 6503    PT Stop Time 1435    PT Time Calculation (min) 30 min    Activity Tolerance Patient tolerated treatment well    Behavior During Therapy WFL for tasks assessed/performed             Past Medical History:  Diagnosis Date   Anemia    Atrial flutter (HCC)    BPH (benign prostatic hypertrophy)    Epistaxis    Hemorrhage of gastrointestinal tract, unspecified    History of open heart surgery    Hypertension    Obesity (BMI 30.0-34.9) 06/29/2020   Osteoarthrosis, unspecified whether generalized or localized, unspecified site    Personal history of venous thrombosis and embolism    Rosacea    Stroke ([place])    TIA (transient ischemic attack)    Past Surgical History:  Procedure Laterality Date   APPENDECTOMY     HERNIA REPAIR     IR GENERIC HISTORICAL  05/04/2016   IR PERCUTANEOUS ART THROMBECTOMY/INFUSION INTRACRANIAL INC DIAG ANGIO 05/04/2016 [name], MD [facility]   IR GENERIC HISTORICAL  05/04/2016   IR ANGIO VERTEBRAL SEL SUBCLAVIAN INNOMINATE UNI R MOD SED 05/04/2016 [name], MD [facility]   IR GENERIC HISTORICAL  06/07/2016   IR RADIOLOGIST EVAL & MGMT 06/07/2016 [facility]   JOINT REPLACEMENT     MITRAL VALVE REPAIR     mvp repair     RADIOLOGY WITH ANESTHESIA N/A 05/04/2016   Procedure: RADIOLOGY WITH ANESTHESIA;  Surgeon: [name], MD;  Location: [facility];  Service: Radiology;  Laterality: N/A;   TOTAL HIP ARTHROPLASTY     Patient Active Problem List   Diagnosis Date Noted   Urinary retention 12/06/2021   Depression with anxiety 11/01/2021   Pressure injury of skin of sacral region  03/29/2021   Pressure ulcer of left buttock, stage 2 ([place]) 03/29/2021   Cellulitis of left upper extremity 02/08/2021   Pressure injury of left elbow, stage 1 02/08/2021   Other insomnia 02/08/2021   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   Obesity (BMI 30.0-34.9) 06/29/2020   Acute non-recurrent sinusitis 05/11/2020   Need for influenza vaccination 05/11/2020   Paroxysmal atrial fibrillation ([place]) 05/11/2020   Benign prostatic hyperplasia with urinary hesitancy 05/11/2020   Venous ulcer, limited to breakdown of skin ([place]) 02/06/2020   Swelling of lower extremity 02/06/2020   Noise effect on both inner ears 08/12/2019   Presbycusis of both ears 08/12/2019   Left ear pain 05/20/2019   Pressure injury of right buttock, stage 1 05/20/2019   Debilitated 05/20/2019   Elevated glucose 04/08/2019   Osteoarthritis of right knee 03/25/2019   Serum potassium elevated 03/18/2019   Hyperlipidemia 09/24/2018   Gait disturbance 09/24/2018   Anxiety 04/30/2018   Lumbar radiculopathy 02/14/2018   Pain in joint of left shoulder 12/28/2017   Osteoarthritis of left glenohumeral joint 12/28/2017   Pain in left foot 10/23/2017   History of cerebrovascular accident 10/18/2017   Coronary artery disease involving native coronary artery of native heart without angina pectoris    Persistent atrial fibrillation (HCC)    Dysarthria, post-stroke  Dysphagia, post-stroke    Cerebrovascular accident (CVA) due to thrombosis of precerebral artery (HCC)    Scoliosis (and kyphoscoliosis), idiopathic 01/26/2014   Arthralgia of hip 02/07/2012   Osteoarthritis of hip 02/07/2012   Sacroiliitis ([place]) 02/07/2012   ROTATOR CUFF SYNDROME, LEFT 03/07/2010   OTH MALIG NEOPLASM SKIN OTH&UNSPEC PARTS FACE 12/06/2009   ACTINIC KERATOSIS, HEAD 09/06/2009   Osteoarthritis 02/20/2009   History of mitral valve repair 02/20/2009   HIP REPLACEMENT, TOTAL, HX OF 02/20/2009   APPENDECTOMY, HX OF 02/20/2009   HERNIORRHAPHY,  HX OF 02/20/2009   EPISTAXIS, RECURRENT 11/19/2008   UNS ADVRS EFF UNS RX MEDICINAL&BIOLOGICAL SBSTNC 06/12/2008   OTHER AND UNSPECIFIED MITRAL VALVE DISEASES 03/12/2008   ATRIAL FLUTTER 03/12/2008   Essential hypertension 03/06/2007   PEPTIC ULCER DISEASE 03/06/2007   BENIGN PROSTATIC HYPERTROPHY 03/06/2007   Coronary atherosclerosis 02/13/2007   ROSACEA 02/13/2007   DVT, HX OF 02/13/2007    ONSET DATE: 10/14/2021 (referral)  REFERRING DIAG: I67.9,G81.90 (ICD-10-CM) - 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 Pt reports he would like to walk and wants to join the [employer] to start aquatic therapy. Max education to pt regarding realistic expectations with mobility, as he has not been ambulatory in >5 years, and safety concerns with pool therapy as pt is a total assist transfer. Pt and son report noncompliance w/HEP since DC from therapy in February. Provided education regarding importance of performing HEP to maintain strength and for overall health. Noted pt's daughter attempting to obtain palliative care for pt, provided education on role of palliative care. Pt reports he was receiving HHPT until recently, could not obtain doctor's script to continue services. At this time, recommending pt continue with HHPT/HHOT due to difficulty w/transportation, caregiver burden and pt reaching maximum mobility potential in OPPT. Family and caregiver in agreement that pt more appropriate for home health therapy at this time.   Pt accompanied by: family member and caregiver  PERTINENT HISTORY: h/o Rt CVA in Oct. 2017,  HTN, hx of arthritis   PAIN:  Are you having pain? Yes: NPRS scale: 9/10 Pain location: LUE  Pain description: [place]   GOALS: N/A  ASSESSMENT:  CLINICAL IMPRESSION: Patient is a 88 year old male referred  to Neuro OPPT for CVA.   Pt's PMH is significant for: h/o Rt CVA in Oct. 2017, HTN, hx of arthritis. At this time, pt not appropriate for OPPT due to reaching maximum mobility potential a few months prior with no change in function. Informed pt and family that HHPT was more appropriate at this time due to pt's difficulty w/transportation and total A required for mobility (baseline). Educated pt on obtaining palliative care and performing HEP at home for maintenance of function and importance of performing repositioning every hour for pressure relief.    CLINICAL DECISION MAKING: Stable/uncomplicated  EVALUATION COMPLEXITY: Low  PLAN: PT FREQUENCY: one time visit   Adamariz Gillott E Neville Pauls, PT, DPT 01/10/2022, 3:09 PM

## 2022-01-31 ENCOUNTER — Ambulatory Visit (INDEPENDENT_AMBULATORY_CARE_PROVIDER_SITE_OTHER): Payer: Medicare Other | Admitting: Family Medicine

## 2022-01-31 ENCOUNTER — Encounter: Payer: Medicare Other | Admitting: Student in an Organized Health Care Education/Training Program

## 2022-01-31 ENCOUNTER — Encounter: Payer: Self-pay | Admitting: Family Medicine

## 2022-01-31 VITALS — BP 115/66 | HR 57 | Temp 98.4°F | Ht 68.0 in | Wt 189.0 lb

## 2022-01-31 DIAGNOSIS — I10 Essential (primary) hypertension: Secondary | ICD-10-CM

## 2022-01-31 DIAGNOSIS — L89322 Pressure ulcer of left buttock, stage 2: Secondary | ICD-10-CM

## 2022-01-31 DIAGNOSIS — L89022 Pressure ulcer of left elbow, stage 2: Secondary | ICD-10-CM

## 2022-01-31 DIAGNOSIS — E782 Mixed hyperlipidemia: Secondary | ICD-10-CM | POA: Diagnosis not present

## 2022-01-31 DIAGNOSIS — R5381 Other malaise: Secondary | ICD-10-CM

## 2022-01-31 MED ORDER — DYNAGINATE AG SILVER CAL 4"X5" 4"X5" EX PADS
1.0000 | MEDICATED_PAD | CUTANEOUS | 5 refills | Status: AC
Start: 2022-01-31 — End: 2022-02-07

## 2022-01-31 NOTE — Progress Notes (Signed)
Established Patient Office Visit  Subjective   Patient ID: Brian [name], male    DOB: 10-01-1932  Age: 88 y.o. MRN: [id number]  Chief Complaint  Patient presents with   Follow-up    6 month follow up, check pressure sores on buttocks, left elbow, and both knees. Left foot and ankle red and swollen x 1 week.      HPI for follow-up of hypertension and pressure injuries involving his biotic and left elbow.  He is accompanied by his [name].  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  Patient has care by home health aides.  Blood pressure is well controlled with losartan.  Continues pravastatin for control of elevated cholesterol with history of CVA and left hemiparesis.    Review of Systems  Constitutional: Negative.   HENT:  Positive for hearing loss.   Eyes:  Negative for blurred vision, discharge and redness.  Respiratory: Negative.    Cardiovascular: Negative.   Gastrointestinal:  Negative for abdominal pain.  Genitourinary: Negative.   Musculoskeletal:  Positive for back pain. Negative for myalgias.  Skin:  Negative for rash.  Neurological:  Positive for weakness. Negative for tingling and loss of consciousness.  Endo/Heme/Allergies:  Negative for polydipsia.      Objective:     BP 115/66 (BP Location: Right Arm, Patient Position: Sitting, Cuff Size: Normal)   Pulse (!) 57   Temp 98.4 F (36.9 C) (Temporal)   Ht '5\' 8"'$  (1.727 m)   Wt 189 lb (85.7 kg)   SpO2 95%   BMI 28.74 kg/m    Physical Exam Constitutional:      General: He is not in acute distress.    Appearance: Normal appearance. He is not ill-appearing, toxic-appearing or diaphoretic.  HENT:     Head: Normocephalic and atraumatic.     Right Ear: External ear normal.     Left Ear: External ear normal.  Eyes:     General: No scleral icterus.       Right eye: No discharge.        Left eye: No discharge.     Extraocular Movements: Extraocular  movements intact.     Conjunctiva/sclera: Conjunctivae normal.  Cardiovascular:     Rate and Rhythm: Normal rate and regular rhythm.  Pulmonary:     Effort: Pulmonary effort is normal. No respiratory distress.     Breath sounds: Normal breath sounds.  Musculoskeletal:     Cervical back: No rigidity or tenderness.  Skin:    General: Skin is warm and dry.       Neurological:     Mental Status: He is alert and oriented to person, place, and time.  Psychiatric:        Mood and Affect: Mood normal.        Behavior: Behavior normal.      No results found for any visits on 01/31/22.    The ASCVD Risk score (Arnett DK, et al., 2019) failed to calculate for the following reasons:   The 2019 ASCVD risk score is only valid for ages 15 to 85   The patient has a prior MI or stroke diagnosis    Assessment & Plan:   Problem List Items Addressed This Visit       Cardiovascular and Mediastinum   Essential hypertension     Other   Mixed hyperlipidemia   Debilitated   Pressure injury of left buttock, stage 2 (  [place]) - Primary   Relevant Medications   Calcium Alginate-Silver (DYNAGINATE AG SILVER CAL 4"X5") 4"X5" PADS   Other Relevant Orders   AMB referral to wound care center   Pressure injury of left elbow, stage 2 ([place])   Relevant Medications   Calcium Alginate-Silver (DYNAGINATE AG SILVER CAL 4"X5") 4"X5" PADS   Other Relevant Orders   AMB referral to wound care center    Return in about 3 months (around 05/03/2022).  Encouraged frequent turning on the lower to avoid pressure injuries.  Dressing impregnated with calcium alginate.  Information was given on pressure injuries.  Encouraged patient's son, [name], to consider placement for adequate care. Continue losartan and pravastatin at current doses.  Libby Maw, MD

## 2022-02-14 ENCOUNTER — Other Ambulatory Visit: Payer: Self-pay | Admitting: Family Medicine

## 2022-02-14 ENCOUNTER — Encounter (HOSPITAL_BASED_OUTPATIENT_CLINIC_OR_DEPARTMENT_OTHER): Payer: Medicare Other | Attending: General Surgery | Admitting: General Surgery

## 2022-02-14 DIAGNOSIS — I10 Essential (primary) hypertension: Secondary | ICD-10-CM | POA: Diagnosis not present

## 2022-02-14 DIAGNOSIS — Z8673 Personal history of transient ischemic attack (TIA), and cerebral infarction without residual deficits: Secondary | ICD-10-CM | POA: Insufficient documentation

## 2022-02-14 DIAGNOSIS — G4709 Other insomnia: Secondary | ICD-10-CM

## 2022-02-14 DIAGNOSIS — L89023 Pressure ulcer of left elbow, stage 3: Secondary | ICD-10-CM | POA: Diagnosis not present

## 2022-02-14 DIAGNOSIS — Z86718 Personal history of other venous thrombosis and embolism: Secondary | ICD-10-CM | POA: Diagnosis not present

## 2022-02-14 DIAGNOSIS — L89022 Pressure ulcer of left elbow, stage 2: Secondary | ICD-10-CM | POA: Diagnosis not present

## 2022-02-14 DIAGNOSIS — M179 Osteoarthritis of knee, unspecified: Secondary | ICD-10-CM | POA: Insufficient documentation

## 2022-02-14 DIAGNOSIS — I4819 Other persistent atrial fibrillation: Secondary | ICD-10-CM | POA: Diagnosis not present

## 2022-02-14 DIAGNOSIS — I2510 Atherosclerotic heart disease of native coronary artery without angina pectoris: Secondary | ICD-10-CM | POA: Insufficient documentation

## 2022-02-14 DIAGNOSIS — L98492 Non-pressure chronic ulcer of skin of other sites with fat layer exposed: Secondary | ICD-10-CM | POA: Diagnosis not present

## 2022-02-14 NOTE — Progress Notes (Addendum)
FINTAN, GRATER ([id number]) Visit Report for 02/14/2022 Chief Complaint Document Details Patient Name: Date of Service: [name] ID M. 02/14/2022 1:30 PM Medical Record Number: [id number] Patient Account Number: [id number] Date of Birth/Sex: Treating RN: [birth year] (88 y.o. [name] Primary Care Provider: Abelino Derrick Other Clinician: Referring Provider: Treating Provider/Extender: Sandi Raveling in Treatment: 0 Information Obtained from: Patient Chief Complaint 08/05/2019; patient is here for review of pressure ulcers on his buttock 02/14/22: patient is here for review of pressure ulcers on his left elbow Electronic Signature(s) Signed: 02/14/2022 2:47:08 PM By: Fredirick Maudlin MD FACS Entered By: Fredirick Maudlin on 02/14/2022 14:47:08 -------------------------------------------------------------------------------- Debridement Details Patient Name: Date of Service: GO Eddie Dibbles, DA V ID M. 02/14/2022 1:30 PM Medical Record Number: [id number] Patient Account Number: [id number] Date of Birth/Sex: Treating RN: Mar 19, 1933 (89 y.o. [name] Primary Care Provider: Abelino Derrick Other Clinician: Referring Provider: Treating Provider/Extender: Sandi Raveling in Treatment: 0 Debridement Performed for Assessment: Wound #1 Left Elbow Performed By: Physician Fredirick Maudlin, MD Debridement Type: Debridement Level of Consciousness (Pre-procedure): Awake and Alert Pre-procedure Verification/Time Out Yes - 14:34 Taken: Start Time: 14:34 Pain Control: Lidocaine 4% T opical Solution T Area Debrided (L x W): otal 4 (cm) x 2.5 (cm) = 10 (cm) Tissue and other material debrided: Viable, Non-Viable, Slough, Subcutaneous, Slough Level: Skin/Subcutaneous Tissue Debridement Description: Excisional Instrument: Curette Bleeding: Minimum Hemostasis Achieved: Pressure Procedural Pain: 0 Post Procedural Pain: 0 Response to  Treatment: Procedure was tolerated well Level of Consciousness (Post- Awake and Alert procedure): Post Debridement Measurements of Total Wound Length: (cm) 4.9 Stage: Category/Stage III Width: (cm) 2.5 Depth: (cm) 0.1 Volume: (cm) 0.962 Character of Wound/Ulcer Post Debridement: Improved Post Procedure Diagnosis Same as Pre-procedure Electronic Signature(s) Signed: 02/16/2022 5:59:23 PM By: Baruch Gouty RN, BSN Signed: 02/17/2022 7:42:03 AM By: Fredirick Maudlin MD FACS Previous Signature: 02/14/2022 3:18:38 PM Version By: Fredirick Maudlin MD FACS Previous Signature: 02/14/2022 4:36:27 PM Version By: Adline Peals Entered By: Baruch Gouty on 02/16/2022 16:39:02 -------------------------------------------------------------------------------- HPI Details Patient Name: Date of Service: GO Eddie Dibbles, DA V ID M. 02/14/2022 1:30 PM Medical Record Number: [id number] Patient Account Number: [id number] Date of Birth/Sex: Treating RN: 05-01-1933 (88 y.o. [name] Primary Care Provider: Abelino Derrick Other Clinician: Referring Provider: Treating Provider/Extender: Sandi Raveling in Treatment: 0 History of Present Illness HPI Description: ADMISSION 08/05/19 This is an 88 year old man who arrives in clinic accompanied by his son. 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 Apparently noted recently to have a stage I pressure area on the left buttock. He does not have a wound history. He does have been air fluidized mattress. They have a wheelchair cushion as well as a pillow over the top of this. He is not incontinent of urine or bowel. Past medical history includes hypertension, osteoarthritis of the knee, atrial fibrillation, history of mitral valve repair, right hemisphere CVA with left hemiparesis, history of a DVT READMISSION 02/14/2022 The patient is here today for evaluation of pressure ulcers on his left  elbow and possible right buttock ulcer. Apparently when he was seen by Dr. Dellia Nims in 2020, no ulcer was appreciated on the buttock and no further wound care involvement occurred. 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 He does have home  health aides and they are turning him every 2 hours side to side. He is on a memory foam mattress at this time. Electronic Signature(s) Signed: 02/16/2022 2:11:51 PM By: Fredirick Maudlin MD FACS Previous Signature: 02/14/2022 2:49:32 PM Version By: Fredirick Maudlin MD FACS Entered By: Fredirick Maudlin on 02/16/2022 14:11:51 -------------------------------------------------------------------------------- Physical Exam Details Patient Name: Date of Service: [name] ID M. 02/14/2022 1:30 PM Medical Record Number: [id number] Patient Account Number: [id number] Date of Birth/Sex: Treating RN: 25-Jul-1933 (88 y.o. [name] Primary Care Provider: Abelino Derrick Other Clinician: Referring Provider: Treating Provider/Extender: Sandi Raveling in Treatment: 0 Constitutional . Bradycardic, asymptomatic.. . . No acute distress. Respiratory Normal work of breathing on room air.. Notes 02/14/2022: On the patient's left elbow, there are 2 open stage 3 pressure ulcers. The elbow itself is swollen and a bit erythematous, but this seems to be more secondary to dependence rather than inflammation or infection. There is a layer of slough overlying each of the ulcers. Inspection of the patient's gluteal and sacral areas does not identify any open wound. Electronic Signature(s) Signed: 02/16/2022 2:12:04 PM By: Fredirick Maudlin MD FACS Previous Signature: 02/14/2022 2:51:32 PM Version By: Fredirick Maudlin MD FACS Entered By: Fredirick Maudlin on 02/16/2022  14:12:03 -------------------------------------------------------------------------------- Physician Orders Details Patient Name: Date of Service: [name] ID M. 02/14/2022 1:30 PM Medical Record Number: [id number] Patient Account Number: [id number] Date of Birth/Sex: Treating RN: [birth year] (88 y.o. [name] Primary Care Provider: Abelino Derrick Other Clinician: Referring Provider: Treating Provider/Extender: Sandi Raveling in Treatment: 0 Verbal / Phone Orders: No Diagnosis Coding ICD-10 Coding Code Description [phone] Pressure ulcer of left elbow, stage 3 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 I48.19 Other persistent atrial fibrillation I25.10 Atherosclerotic heart disease of native coronary artery without angina pectoris Follow-up Appointments Return Appointment in 1 week. Bathing/ Shower/ Hygiene May shower and wash wound with soap and water. - with dressing changes Off-Loading Turn and reposition every 2 hours - stay off of left elbow Wound Treatment Wound #1 - Elbow Wound Laterality: Left Cleanser: Soap and Water Every Other Day/10 Days Discharge Instructions: May shower and wash wound with dial antibacterial soap and water prior to dressing change. Cleanser: Wound Cleanser Every Other Day/10 Days Discharge Instructions: Cleanse the wound with wound cleanser prior to applying a clean dressing using gauze sponges, not tissue or cotton balls. Prim Dressing: KerraCel Ag Gelling Fiber Dressing, 2x2 in (silver alginate) (DME) (Generic) Every Other Day/10 Days ary Discharge Instructions: Apply silver alginate to wound bed as instructed Secondary Dressing: ALLEVYN Gentle Border, 5x5 (in/in) (DME) (Generic) Every Other Day/10 Days Discharge Instructions: Apply over primary dressing as directed. Secondary Dressing: ALLEVYN Heel 4 1/2in x 5 1/2in / 10.5cm x 13.5cm (DME) (Generic) Every Other Day/10  Days Discharge Instructions: Apply over primary dressing as directed. Secured With: [employer], 4.5x3.1 (in/yd) (DME) (Generic) Every Other Day/10 Days Discharge Instructions: Secure with Kerlix as directed. Secured With: 58M Medipore H Soft Cloth Surgical T ape, 4 x 10 (in/yd) (DME) (Generic) Every Other Day/10 Days Discharge Instructions: Secure with tape as directed. Electronic Signature(s) Signed: 02/16/2022 3:06:32 PM By: Fredirick Maudlin MD FACS Previous Signature: 02/14/2022 3:18:38 PM Version By: Fredirick Maudlin MD FACS Entered By: Fredirick Maudlin on 02/16/2022 14:12:21 -------------------------------------------------------------------------------- Problem List Details Patient Name: Date of Service: [name] ID M. 02/14/2022 1:30 PM Medical Record Number: [id number] Patient Account Number: [id number]  Date of Birth/Sex: Treating RN: 1933/06/27 (88 y.o. [name] Primary Care Provider: Abelino Derrick Other Clinician: Referring Provider: Treating Provider/Extender: Sandi Raveling in Treatment: 0 Active Problems ICD-10 Encounter Code Description Active Date MDM Diagnosis L89.023 Pressure ulcer of left elbow, stage 3 02/14/2022 No Yes 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 02/14/2022 No Yes I48.19 Other persistent atrial fibrillation 02/14/2022 No Yes I25.10 Atherosclerotic heart disease of native coronary artery without angina pectoris 02/14/2022 No Yes Inactive Problems Resolved Problems Electronic Signature(s) Signed: 02/16/2022 2:11:03 PM By: Fredirick Maudlin MD FACS Previous Signature: 02/14/2022 2:44:49 PM Version By: Fredirick Maudlin MD FACS Entered By: Fredirick Maudlin on 02/16/2022 14:11:03 -------------------------------------------------------------------------------- Progress Note Details Patient Name: Date of Service: [name] ID M. 02/14/2022 1:30 PM Medical Record Number:  [id number] Patient Account Number: [id number] Date of Birth/Sex: Treating RN: 09-Feb-1933 (88 y.o. [name] Primary Care Provider: Abelino Derrick Other Clinician: Referring Provider: Treating Provider/Extender: Sandi Raveling in Treatment: 0 Subjective Chief Complaint Information obtained from Patient 08/05/2019; patient is here for review of pressure ulcers on his buttock 02/14/22: patient is here for review of pressure ulcers on his left elbow History of Present Illness (HPI) ADMISSION 08/05/19 This is an 88 year old man who arrives in clinic accompanied by his son. 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 Apparently noted recently to have a stage I pressure area on the left buttock. He does not have a wound history. He does have been air fluidized mattress. They have a wheelchair cushion as well as a pillow over the top of this. He is not incontinent of urine or bowel. Past medical history includes hypertension, osteoarthritis of the knee, atrial fibrillation, history of mitral valve repair, right hemisphere CVA with left hemiparesis, history of a DVT READMISSION 02/14/2022 The patient is here today for evaluation of pressure ulcers on his left elbow and possible right buttock ulcer. Apparently when he was seen by Dr. Dellia Nims in 2020, no ulcer was appreciated on the buttock and no further wound care involvement occurred. 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 He does have home health aides and they are turning him every 2 hours side to side. He is on a memory foam mattress at this time. Patient History Information obtained from Patient. Allergies penicillin (Severity: Moderate, Reaction: itching), Novocain (Reaction: irregular heart beat) Family History Diabetes - Maternal  Grandparents, Heart Disease - Mother, Hypertension - Mother, No family history of Cancer, Hereditary Spherocytosis, Kidney Disease, Lung Disease, Seizures, Stroke, Thyroid Problems, Tuberculosis. Social History Never smoker, Marital Status - Married, Alcohol Use - Never, Drug Use - No History, Caffeine Use - Never. Medical History Eyes Patient has history of Cataracts - removed Cardiovascular Patient has history of Arrhythmia - Atrial Flutter/Atrial Fib, Coronary Artery Disease, Hypotension Denies history of Hypertension Musculoskeletal Patient has history of Osteoarthritis Neurologic Denies history of Paraplegia Hospitalization/Surgery History - open heart surgery. - IR generic histoircal. - appendectomy. - hernia repair. - joint replacement. - mitral valve repair. - mvp repair. - total hip arthroplasty. Medical A Surgical History Notes nd Constitutional Symptoms (General Health) obseity Ear/Nose/Mouth/Throat wears hearing aids Cardiovascular Mitral Valve Repair, Genitourinary BPH Neurologic CVA 2018, left side hemiparesis Review of Systems (ROS) Constitutional Symptoms (General Health) Denies complaints or symptoms of Fatigue, Fever,  Chills, Marked Weight Change. Eyes Complains or has symptoms of Glasses / Contacts - reading. Respiratory Denies complaints or symptoms of Chronic or frequent coughs, Shortness of Breath. Cardiovascular Denies complaints or symptoms of Chest pain. Gastrointestinal Denies complaints or symptoms of Frequent diarrhea, Nausea, Vomiting. Endocrine Denies complaints or symptoms of Heat/cold intolerance. Genitourinary Denies complaints or symptoms of Frequent urination. Integumentary (Skin) Complains or has symptoms of Wounds. Musculoskeletal Complains or has symptoms of Muscle Pain, Muscle Weakness. Psychiatric Denies complaints or symptoms of Claustrophobia, Suicidal. Objective Constitutional Bradycardic, asymptomatic.Marland Kitchen No acute  distress. Vitals Time Taken: 2:02 PM, Height: 66 in, Source: Stated, Weight: 185 lbs, BMI: 29.9, Temperature: 98 F, Pulse: 53 bpm, Respiratory Rate: 18 breaths/min, Blood Pressure: 122/54 mmHg. Respiratory Normal work of breathing on room air.. General Notes: 02/14/2022: On the patient's left elbow, there are 2 open stage 3 pressure ulcers. The elbow itself is swollen and a bit erythematous, but this seems to be more secondary to dependence rather than inflammation or infection. There is a layer of slough overlying each of the ulcers. Inspection of the patient's gluteal and sacral areas does not identify any open wound. Integumentary (Hair, Skin) Wound #1 status is Open. Original cause of wound was Pressure Injury. The date acquired was: 12/19/2021. The wound is located on the Left Elbow. The wound measures 4.9cm length x 2.5cm width x 0.1cm depth; 9.621cm^2 area and 0.962cm^3 volume. There is Fat Layer (Subcutaneous Tissue) exposed. There is no tunneling or undermining noted. There is a medium amount of serosanguineous drainage noted. The wound margin is distinct with the outline attached to the wound base. There is small (1-33%) red granulation within the wound bed. There is a large (67-100%) amount of necrotic tissue within the wound bed including Adherent Slough. 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 There was a Excisional Skin/Subcutaneous Tissue Debridement with a total area of 10 sq cm performed by Fredirick Maudlin, MD. With the following instrument(s): Curette to remove Viable and Non-Viable tissue/material. Material removed includes Subcutaneous Tissue and Slough  and after achieving pain control using Lidocaine 4% T opical Solution. No specimens were taken. A time out was conducted at 14:34, prior to the start of the procedure. A Minimum amount of bleeding was controlled with Pressure. The procedure was tolerated well with a pain level of 0 throughout and a pain level of 0 following the procedure. Post Debridement Measurements: 4.9cm length x 2.5cm width x 0.1cm depth; 0.962cm^3 volume. Post debridement Stage noted as Category/Stage III. Character of Wound/Ulcer Post Debridement is improved. Post procedure Diagnosis Wound #1: Same as Pre-Procedure Plan Follow-up Appointments: Return Appointment in 1 week. Bathing/ Shower/ Hygiene: May shower and wash wound with soap and water. - with dressing changes Off-Loading: Turn and reposition every 2 hours - stay off of left elbow WOUND #1: - Elbow Wound Laterality: Left Cleanser: Soap and Water Every Other Day/10 Days Discharge Instructions: May shower and wash wound with dial antibacterial soap and water prior to dressing change. Cleanser: Wound Cleanser Every Other Day/10 Days Discharge Instructions: Cleanse the wound with wound cleanser prior to applying a clean dressing using gauze sponges, not tissue or cotton balls. Prim Dressing: KerraCel Ag Gelling Fiber Dressing, 2x2 in (silver alginate) (DME) (Generic) Every  Other Day/10 Days ary Discharge Instructions: Apply silver alginate to wound bed as instructed Secondary Dressing: ALLEVYN Gentle Border, 5x5 (in/in) (DME) (Generic) Every Other Day/10 Days Discharge Instructions: Apply over primary dressing as directed. Secondary Dressing: ALLEVYN Heel 4 1/2in x 5 1/2in / 10.5cm x 13.5cm (DME) (Generic) Every Other Day/10 Days Discharge Instructions: Apply over primary dressing as directed. Secured With: [employer], 4.5x3.1 (in/yd) (DME) (Generic) Every Other Day/10 Days Discharge Instructions: Secure with Kerlix as directed. Secured With: 44M  Medipore H Soft Cloth Surgical T ape, 4 x 10 (in/yd) (DME) (Generic) Every Other Day/10 Days Discharge Instructions: Secure with tape as directed. 02/14/2022: On the patient's left elbow, there are 2 open stage II pressure ulcers. The elbow itself is swollen and a bit erythematous, but this seems to be more secondary to dependence rather than inflammation or infection. There is a layer of slough overlying each of the ulcers. Inspection of the patient's gluteal and sacral areas does not identify any open wound. I used a curette to debride the slough off of both of the elbow wounds. We will apply silver alginate to these with a foam border dressing and then use a heel cup to try and protect the area. I discussed with the patient, his home health provider, and his son, that he simply cannot spend all of his time leaning on that elbow. They will take measures at home to help support him so that he does not put all of his weight on the wounds. They are already turning him every 2 hours and they need to continue this to avoid opening up of any sacral or gluteal wounds. Per the patient's son's request, he will follow-up in 2 weeks. Electronic Signature(s) Signed: 02/16/2022 5:59:23 PM By: Baruch Gouty RN, BSN Signed: 02/17/2022 7:42:03 AM By: Fredirick Maudlin MD FACS Previous Signature: 02/16/2022 2:12:35 PM Version By: Fredirick Maudlin MD FACS Previous Signature: 02/14/2022 2:55:54 PM Version By: Fredirick Maudlin MD FACS Entered By: Baruch Gouty on 02/16/2022 16:39:14 -------------------------------------------------------------------------------- HxROS Details Patient Name: Date of Service: GO Stacy Gardner V ID M. 02/14/2022 1:30 PM Medical Record Number: [id number] Patient Account Number: [id number] Date of Birth/Sex: Treating RN: 1933/06/22 (88 y.o. [name] Primary Care Provider: Abelino Derrick Other Clinician: Referring Provider: Treating Provider/Extender: Sandi Raveling in Treatment: 0 Information Obtained From Patient Constitutional Symptoms (General Health) Complaints and Symptoms: Negative for: Fatigue; Fever; Chills; Marked Weight Change Medical History: Past Medical History Notes: obseity Eyes Complaints and Symptoms: Positive for: Glasses / Contacts - reading Medical History: Positive for: Cataracts - removed Respiratory Complaints and Symptoms: Negative for: Chronic or frequent coughs; Shortness of Breath Cardiovascular Complaints and Symptoms: Negative for: Chest pain Medical History: Positive for: Arrhythmia - Atrial Flutter/Atrial Fib; Coronary Artery Disease; Hypotension Negative for: Hypertension Past Medical History Notes: Mitral Valve Repair, Gastrointestinal Complaints and Symptoms: Negative for: Frequent diarrhea; Nausea; Vomiting Endocrine Complaints and Symptoms: Negative for: Heat/cold intolerance Genitourinary Complaints and Symptoms: Negative for: Frequent urination Medical History: Past Medical History Notes: BPH Integumentary (Skin) Complaints and Symptoms: Positive for: Wounds Musculoskeletal Complaints and Symptoms: Positive for: Muscle Pain; Muscle Weakness Medical History: Positive for: Osteoarthritis Psychiatric Complaints and Symptoms: Negative for: Claustrophobia; Suicidal Ear/Nose/Mouth/Throat Medical History: Past Medical History Notes: wears hearing aids Hematologic/Lymphatic Immunological Neurologic Medical History: Negative for: Paraplegia Past Medical History Notes: CVA 2018, left side hemiparesis Oncologic HBO Extended History Items Eyes: Cataracts Immunizations Pneumococcal Vaccine: Received Pneumococcal Vaccination: Yes Received Pneumococcal Vaccination On or After 60th  Birthday: Yes Implantable Devices None Hospitalization / Surgery History Type of Hospitalization/Surgery open heart surgery IR generic histoircal appendectomy hernia  repair joint replacement mitral valve repair mvp repair total hip arthroplasty Family and Social History Cancer: No; Diabetes: Yes - Maternal Grandparents; Heart Disease: Yes - Mother; Hereditary Spherocytosis: No; Hypertension: Yes - Mother; Kidney Disease: No; Lung Disease: No; Seizures: No; Stroke: No; Thyroid Problems: No; Tuberculosis: No; Never smoker; Marital Status - Married; Alcohol Use: Never; Drug Use: No History; Caffeine Use: Never; Financial Concerns: No; Food, Clothing or Shelter Needs: No; Support System Lacking: No; Transportation Concerns: No Electronic Signature(s) Signed: 02/14/2022 3:18:38 PM By: Fredirick Maudlin MD FACS Signed: 02/14/2022 4:36:27 PM By: Adline Peals Entered By: Adline Peals on 02/14/2022 14:12:20 -------------------------------------------------------------------------------- SuperBill Details Patient Name: Date of Service: [name] ID M. 02/14/2022 Medical Record Number: [id number] Patient Account Number: [id number] Date of Birth/Sex: Treating RN: May 19, 1933 (89 y.o. [name] Primary Care Provider: Abelino Derrick Other Clinician: Referring Provider: Treating Provider/Extender: Sandi Raveling in Treatment: 0 Diagnosis Coding ICD-10 Codes Code Description [phone] Pressure ulcer of left elbow, stage 3 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 I48.19 Other persistent atrial fibrillation I25.10 Atherosclerotic heart disease of native coronary artery without angina pectoris Facility Procedures CPT4 Code: [id number] Description: [place] VISIT-LEV 3 EST PT Modifier: 25 Quantity: 1 CPT4 Code: [id number] Description: 11042 - DEB SUBQ TISSUE 20 SQ CM/< ICD-10 Diagnosis Description L89.023 Pressure ulcer of left elbow, stage 3 Modifier: Quantity: 1 Physician Procedures : CPT4 Code Description Modifier [id number] - WC PHYS LEVEL 4 - EST PT 25  ICD-10 Diagnosis Description 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 I25.10 Atherosclerotic heart disease of native coronary artery  without angina pectoris Quantity: 1 : [id number] 11042 - WC PHYS SUBQ TISS 20 SQ CM ICD-10 Diagnosis Description L89.023 Pressure ulcer of left elbow, stage 3 Quantity: 1 Electronic Signature(s) Signed: 02/16/2022 2:16:08 PM By: Fredirick Maudlin MD FACS Previous Signature: 02/15/2022 4:55:46 PM Version By: Fredirick Maudlin MD FACS Previous Signature: 02/15/2022 5:01:07 PM Version By: Adline Peals Previous Signature: 02/14/2022 2:56:13 PM Version By: Fredirick Maudlin MD FACS Entered By: Fredirick Maudlin on 02/16/2022 14:16:07

## 2022-02-14 NOTE — Progress Notes (Addendum)
Brian [name], Brian [name] ([id number]) Visit Report for 02/14/2022 Allergy List Details Patient Name: Date of Service: Brian [name] ID M. 02/14/2022 1:30 PM Medical Record Number: [id number] Patient Account Number: [id number] Date of Birth/Sex: Treating RN: September 16, 1932 (88 y.o. Brian [name] Primary Care Glendon Fiser: Abelino Derrick Other Clinician: Referring Floye Fesler: Treating [place] Current/Extender: Sandi Raveling in Treatment: 0 Allergies Active Allergies penicillin Reaction: itching Severity: Moderate Novocain Reaction: irregular heart beat Allergy Notes Electronic Signature(s) Signed: 02/14/2022 5:08:38 PM By: Baruch Gouty RN, BSN Entered By: Baruch Gouty on 02/14/2022 14:03:59 -------------------------------------------------------------------------------- Arrival Information Details Patient Name: Date of Service: Brian [name] ID M. 02/14/2022 1:30 PM Medical Record Number: [id number] Patient Account Number: [id number] Date of Birth/Sex: Treating RN: [birth year] (89 y.o. Brian [name] Primary Care Jarion Hawthorne: Abelino Derrick Other Clinician: Referring Atul Delucia: Treating Reagen Haberman/Extender: Sandi Raveling in Treatment: 0 Visit Information Patient Arrived: Wheel Chair Arrival Time: 13:56 Accompanied By: son and caregiver Transfer Assistance: Manual Patient Identification Verified: Yes Secondary Verification Process Completed: Yes Patient Requires Transmission-Based Precautions: No Patient Has Alerts: Yes Patient Alerts: Patient on Blood Thinner History Since Last Visit Electronic Signature(s) Signed: 02/14/2022 5:08:38 PM By: Baruch Gouty RN, BSN Entered By: Baruch Gouty on 02/14/2022 14:02:27 -------------------------------------------------------------------------------- Clinic Level of Care Assessment Details Patient Name: Date of Service: Brian [name] ID M. 02/14/2022 1:30 PM Medical Record Number:  [id number] Patient Account Number: [id number] Date of Birth/Sex: Treating RN: 1932/11/14 (88 y.o. Brian [name] Primary Care Maleya Leever: Abelino Derrick Other Clinician: Referring Emilyn Ruble: Treating Adalae Baysinger/Extender: Sandi Raveling in Treatment: 0 Clinic Level of Care Assessment Items TOOL 1 Quantity Score X- 1 0 Use when EandM and Procedure is performed on INITIAL visit ASSESSMENTS - Nursing Assessment / Reassessment X- 1 20 General Physical Exam (combine w/ comprehensive assessment (listed just below) when performed on new pt. evals) X- 1 25 Comprehensive Assessment (HX, ROS, Risk Assessments, Wounds Hx, etc.) ASSESSMENTS - Wound and Skin Assessment / Reassessment '[]'$  - 0 Dermatologic / Skin Assessment (not related to wound area) ASSESSMENTS - Ostomy and/or Continence Assessment and Care '[]'$  - 0 Incontinence Assessment and Management '[]'$  - 0 Ostomy Care Assessment and Management (repouching, etc.) PROCESS - Coordination of Care X - Simple Patient / Family Education for ongoing care 1 15 '[]'$  - 0 Complex (extensive) Patient / Family Education for ongoing care X- 1 10 Staff obtains Programmer, systems, Records, T Results / Process Orders est '[]'$  - 0 Staff telephones HHA, Nursing Homes / Clarify orders / etc '[]'$  - 0 Routine Transfer to another Facility (non-emergent condition) '[]'$  - 0 Routine Hospital Admission (non-emergent condition) X- 1 15 New Admissions / Biomedical engineer / Ordering NPWT Apligraf, etc. , '[]'$  - 0 Emergency Hospital Admission (emergent condition) PROCESS - Special Needs '[]'$  - 0 Pediatric / Minor Patient Management '[]'$  - 0 Isolation Patient Management '[]'$  - 0 Hearing / Language / Visual special needs '[]'$  - 0 Assessment of Community assistance (transportation, D/C planning, etc.) '[]'$  - 0 Additional assistance / Altered mentation '[]'$  - 0 Support Surface(s) Assessment (bed, cushion, seat, etc.) INTERVENTIONS - Miscellaneous '[]'$  -  0 External ear exam '[]'$  - 0 Patient Transfer (multiple staff / Civil Service fast streamer / Similar devices) '[]'$  - 0 Simple Staple / Suture removal (25 or less) '[]'$  - 0 Complex Staple / Suture removal (26 or more) '[]'$  - 0 Hypo/Hyperglycemic Management (do not check if billed separately) '[]'$  - 0 Ankle / Brachial Index (ABI) -  do not check if billed separately Has the patient been seen at the hospital within the last three years: Yes Total Score: 85 Level Of Care: New/Established - Level 3 Electronic Signature(s) Signed: 02/15/2022 5:01:07 PM By: Adline Peals Entered By: Adline Peals on 02/15/2022 16:52:45 -------------------------------------------------------------------------------- Encounter Discharge Information Details Patient Name: Date of Service: Brian [name], Brian V ID M. 02/14/2022 1:30 PM Medical Record Number: [id number] Patient Account Number: [id number] Date of Birth/Sex: Treating RN: 01-28-1933 (88 y.o. Brian [name] Primary Care Dahir Ayer: Abelino Derrick Other Clinician: Referring Laurelyn Terrero: Treating Faryn Sieg/Extender: Sandi Raveling in Treatment: 0 Encounter Discharge Information Items Post Procedure Vitals Discharge Condition: Stable Temperature (F): 98 Ambulatory Status: Wheelchair Pulse (bpm): 53 Discharge Destination: Home Respiratory Rate (breaths/min): 18 Transportation: Private Auto Blood Pressure (mmHg): 122/54 Accompanied By: son Schedule Follow-up Appointment: Yes Clinical Summary of Care: Patient Declined Electronic Signature(s) Signed: 02/14/2022 4:36:27 PM By: Adline Peals Entered By: Adline Peals on 02/14/2022 16:01:13 -------------------------------------------------------------------------------- Lower Extremity Assessment Details Patient Name: Date of Service: Brian [name] ID M. 02/14/2022 1:30 PM Medical Record Number: [id number] Patient Account Number: [id number] Date of Birth/Sex: Treating RN: [birth year]  (89 y.o. Brian [name] Primary Care Bettejane Leavens: Abelino Derrick Other Clinician: Referring Atlee Villers: Treating Kee Drudge/Extender: Sandi Raveling in Treatment: 0 Electronic Signature(s) Signed: 02/14/2022 4:36:27 PM By: Sabas Sous By: Adline Peals on 02/14/2022 14:15:56 -------------------------------------------------------------------------------- Multi Wound Chart Details Patient Name: Date of Service: Brian Spice ID M. 02/14/2022 1:30 PM Medical Record Number: [id number] Patient Account Number: [id number] Date of Birth/Sex: Treating RN: March 20, 1933 (88 y.o. Brian [name] Primary Care Braelynn Lupton: Abelino Derrick Other Clinician: Referring Christiaan Strebeck: Treating Tamsyn Owusu/Extender: Sandi Raveling in Treatment: 0 Vital Signs Height(in): 66 Pulse(bpm): 53 Weight(lbs): 185 Blood Pressure(mmHg): 122/54 Body Mass Index(BMI): 29.9 Temperature(F): 98 Respiratory Rate(breaths/min): 18 Photos: [1:No Photos Left Elbow] [N/A:N/A N/A] Wound Location: [1:Pressure Injury] [N/A:N/A] Wounding Event: [1:Pressure Ulcer] [N/A:N/A] Primary Etiology: [1:Cataracts, Arrhythmia, Coronary] [N/A:N/A] Comorbid History: [1:Artery Disease, Hypotension, Osteoarthritis 12/19/2021] [N/A:N/A] Date Acquired: [1:0] [N/A:N/A] Weeks of Treatment: [1:Open] [N/A:N/A] Wound Status: [1:No] [N/A:N/A] Wound Recurrence: [1:Yes] [N/A:N/A] Clustered Wound: [1:4.9x2.5x0.1] [N/A:N/A] Measurements L x W x D (cm) [1:9.621] [N/A:N/A] A (cm) : rea [1:0.962] [N/A:N/A] Volume (cm) : [1:Category/Stage II] [N/A:N/A] Classification: [1:Medium] [N/A:N/A] Exudate A mount: [1:Serosanguineous] [N/A:N/A] Exudate Type: [1:red, brown] [N/A:N/A] Exudate Color: [1:Distinct, outline attached] [N/A:N/A] Wound Margin: [1:Small (1-33%)] [N/A:N/A] Granulation A mount: [1:Red] [N/A:N/A] Granulation Quality: [1:Large (67-100%)] [N/A:N/A] Necrotic A  mount: [1:Fat Layer (Subcutaneous Tissue): Yes N/A] Exposed Structures: [1:Fascia: No Tendon: No Muscle: No Joint: No Bone: No Small (1-33%)] [N/A:N/A] Epithelialization: [1:Debridement - Excisional] [N/A:N/A] Debridement: Pre-procedure Verification/Time Out 14:34 [N/A:N/A] Taken: [1:Lidocaine 4% Topical Solution] [N/A:N/A] Pain Control: [1:Subcutaneous, Slough] [N/A:N/A] Tissue Debrided: [1:Skin/Subcutaneous Tissue] [N/A:N/A] Level: [1:10] [N/A:N/A] Debridement A (sq cm): [1:rea Curette] [N/A:N/A] Instrument: [1:Minimum] [N/A:N/A] Bleeding: [1:Pressure] [N/A:N/A] Hemostasis A chieved: [1:0] [N/A:N/A] Procedural Pain: [1:0] [N/A:N/A] Post Procedural Pain: [1:Procedure was tolerated well] [N/A:N/A] Debridement Treatment Response: [1:4.9x2.5x0.1] [N/A:N/A] Post Debridement Measurements L x W x D (cm) [1:0.962] [N/A:N/A] Post Debridement Volume: (cm) [1:Category/Stage II] [N/A:N/A] Post Debridement Stage: [1:Debridement] [N/A:N/A] Treatment Notes Electronic Signature(s) Signed: 02/14/2022 2:46:32 PM By: Fredirick Maudlin MD FACS Signed: 02/14/2022 4:36:27 PM By: Adline Peals Entered By: Fredirick Maudlin on 02/14/2022 14:46:32 -------------------------------------------------------------------------------- Multi-Disciplinary Care Plan Details Patient Name: Date of Service: Brian Spice ID M. 02/14/2022 1:30 PM Medical Record Number: [id number] Patient Account Number: [id number] Date of Birth/Sex: Treating RN: 1932/09/20 (  88 y.o. Brian [name] Primary Care Addaline Peplinski: Abelino Derrick Other Clinician: Referring Tema Alire: Treating Xaria Judon/Extender: Sandi Raveling in Treatment: 0 Active Inactive Abuse / Safety / Falls / Self Care Management Nursing Diagnoses: Impaired physical mobility Potential for falls Goals: Patient/caregiver will verbalize understanding of skin care regimen Date Initiated: 02/14/2022 Target Resolution Date:  03/17/2022 Goal Status: Active Patient/caregiver will verbalize/demonstrate measures taken to prevent injury and/or falls Date Initiated: 02/14/2022 Target Resolution Date: 03/17/2022 Goal Status: Active Interventions: Assess fall risk on admission and as needed Assess self care needs on admission and as needed Notes: Wound/Skin Impairment Nursing Diagnoses: Impaired tissue integrity Knowledge deficit related to ulceration/compromised skin integrity Goals: Patient/caregiver will verbalize understanding of skin care regimen Date Initiated: 02/14/2022 Target Resolution Date: 03/17/2022 Goal Status: Active Ulcer/skin breakdown will have a volume reduction of 30% by week 4 Date Initiated: 02/14/2022 Target Resolution Date: 03/17/2022 Goal Status: Active Interventions: Assess patient/caregiver ability to obtain necessary supplies Assess patient/caregiver ability to perform ulcer/skin care regimen upon admission and as needed Assess ulceration(s) every visit Treatment Activities: Skin care regimen initiated : 02/14/2022 Topical wound management initiated : 02/14/2022 Notes: Electronic Signature(s) Signed: 02/14/2022 4:36:27 PM By: Adline Peals Entered By: Adline Peals on 02/14/2022 15:59:14 -------------------------------------------------------------------------------- Pain Assessment Details Patient Name: Date of Service: Brian [name] ID M. 02/14/2022 1:30 PM Medical Record Number: [id number] Patient Account Number: [id number] Date of Birth/Sex: Treating RN: May 25, 1933 (88 y.o. Brian [name] Primary Care Amarius Toto: Abelino Derrick Other Clinician: Referring Leyli Kevorkian: Treating Marcus Schwandt/Extender: Sandi Raveling in Treatment: 0 Active Problems Location of Pain Severity and Description of Pain Patient Has Paino Yes Site Locations Pain Location: Pain Location: Pain in Ulcers Duration of the Pain. Constant / Intermittento  Intermittent Rate the pain. Current Pain Level: 6 Character of Pain Describe the Pain: Aching Pain Management and Medication Current Pain Management: Medication: Yes Electronic Signature(s) Signed: 02/14/2022 4:36:27 PM By: Adline Peals Entered By: Adline Peals on 02/14/2022 14:25:16 -------------------------------------------------------------------------------- Patient/Caregiver Education Details Patient Name: Date of Service: Brian [name] ID M. 6/27/2023andnbsp1:30 PM Medical Record Number: [id number] Patient Account Number: [id number] Date of Birth/Gender: Treating RN: [birth year] (89 y.o. Brian [name] Primary Care Physician: Abelino Derrick Other Clinician: Referring Physician: Treating Physician/Extender: Sandi Raveling in Treatment: 0 Education Assessment Education Provided To: Patient Education Topics Provided Offloading: Handouts: What is Offloadingo Methods: Explain/Verbal Responses: Reinforcements needed, State content correctly [place]: o Handouts: Welcome T [facility] [place] o Methods: Explain/Verbal, Printed Responses: Reinforcements needed, State content correctly Wound/Skin Impairment: Handouts: Caring for Your Ulcer Methods: Explain/Verbal, Printed Responses: Reinforcements needed, State content correctly Electronic Signature(s) Signed: 02/14/2022 4:36:27 PM By: Adline Peals Entered By: Adline Peals on 02/14/2022 14:40:11 -------------------------------------------------------------------------------- Wound Assessment Details Patient Name: Date of Service: Brian [name] ID M. 02/14/2022 1:30 PM Medical Record Number: [id number] Patient Account Number: [id number] Date of Birth/Sex: Treating RN: 09/16/1932 (88 y.o. Brian [name] Primary Care Amazing Cowman: Abelino Derrick Other Clinician: Referring Tharon Kitch: Treating Ramya Vanbergen/Extender: Sandi Raveling in Treatment: 0 Wound Status Wound Number: 1 Primary Pressure Ulcer Etiology: Wound Location: Left Elbow Wound Status: Open Wounding Event: Pressure Injury Comorbid Cataracts, Arrhythmia, Coronary Artery Disease, Date Acquired: 12/19/2021 History: Hypotension, Osteoarthritis Weeks Of Treatment: 0 Clustered Wound: Yes Wound Measurements Length: (cm) 4.9 Width: (cm) 2.5 Depth: (cm) 0.1 Area: (cm) 9.621 Volume: (cm) 0.962 % Reduction in Area: 0% % Reduction in Volume: 0% Epithelialization:  Small (1-33%) Tunneling: No Undermining: No Wound Description Classification: Category/Stage III Wound Margin: Distinct, outline attached Exudate Amount: Medium Exudate Type: Serosanguineous Exudate Color: red, brown Foul Odor After Cleansing: No Slough/Fibrino Yes Wound Bed Granulation Amount: Small (1-33%) Exposed Structure Granulation Quality: Red Fascia Exposed: No Necrotic Amount: Large (67-100%) Fat Layer (Subcutaneous Tissue) Exposed: Yes Necrotic Quality: Adherent Slough Tendon Exposed: No Muscle Exposed: No Joint Exposed: No Bone Exposed: No Treatment Notes Wound #1 (Elbow) Wound Laterality: Left Cleanser Soap and Water Discharge Instruction: May shower and wash wound with dial antibacterial soap and water prior to dressing change. Wound Cleanser Discharge Instruction: Cleanse the wound with wound cleanser prior to applying a clean dressing using gauze sponges, not tissue or cotton balls. Peri-Wound Care Topical Primary Dressing KerraCel Ag Gelling Fiber Dressing, 2x2 in (silver alginate) Discharge Instruction: Apply silver alginate to wound bed as instructed Secondary Dressing ALLEVYN Gentle Border, 5x5 (in/in) Discharge Instruction: Apply over primary dressing as directed. ALLEVYN Heel 4 1/2in x 5 1/2in / 10.5cm x 13.5cm Discharge Instruction: Apply over primary dressing as directed. Secured With [employer], 4.5x3.1 (in/yd) Discharge  Instruction: Secure with Kerlix as directed. 76M Medipore H Soft Cloth Surgical T ape, 4 x 10 (in/yd) Discharge Instruction: Secure with tape as directed. Compression Wrap Compression Stockings Add-Ons Electronic Signature(s) Signed: 02/16/2022 5:59:23 PM By: Baruch Gouty RN, BSN Signed: 02/17/2022 5:03:19 PM By: Adline Peals Previous Signature: 02/14/2022 4:36:27 PM Version By: Sabas Sous By: Baruch Gouty on 02/16/2022 16:38:33 -------------------------------------------------------------------------------- Vitals Details Patient Name: Date of Service: Brian [name], Brian V ID M. 02/14/2022 1:30 PM Medical Record Number: [id number] Patient Account Number: [id number] Date of Birth/Sex: Treating RN: 03/25/1933 (88 y.o. Brian [name] Primary Care Alenna Russell: Abelino Derrick Other Clinician: Referring Ameia Morency: Treating Chaney Ingram/Extender: Sandi Raveling in Treatment: 0 Vital Signs Time Taken: 14:02 Temperature (F): 98 Height (in): 66 Pulse (bpm): 53 Source: Stated Respiratory Rate (breaths/min): 18 Weight (lbs): 185 Blood Pressure (mmHg): 122/54 Body Mass Index (BMI): 29.9 Reference Range: 80 - 120 mg / dl Electronic Signature(s) Signed: 02/14/2022 5:08:38 PM By: Baruch Gouty RN, BSN Entered By: Baruch Gouty on 02/14/2022 14:03:51

## 2022-02-18 ENCOUNTER — Encounter: Payer: Self-pay | Admitting: Family Medicine

## 2022-02-24 ENCOUNTER — Telehealth: Payer: Self-pay | Admitting: Family Medicine

## 2022-02-24 DIAGNOSIS — L89322 Pressure ulcer of left buttock, stage 2: Secondary | ICD-10-CM

## 2022-02-24 DIAGNOSIS — L89022 Pressure ulcer of left elbow, stage 2: Secondary | ICD-10-CM

## 2022-02-24 NOTE — Telephone Encounter (Signed)
Pt advised , could not locate daughters phone number, called all numbers on chart. Spoke w/ pt himself

## 2022-02-24 NOTE — Telephone Encounter (Signed)
Pts daughter called to report that he has severe bed sores some are starting to look infected.  She stated that [facility] needs an order for them to give him intravenous antibiotics so they don't have to transport him anywhere. I did not actually speak to this agency and she was not able to provide a contact person. The # I found is [phone]

## 2022-02-24 NOTE — Telephone Encounter (Signed)
  Pts daughter is calling to request an intravenous antibiotic. I suggested that the [facility]  Agency needs to send an order request for this. She stated that they told her they do not that she can call and request it. Please advise

## 2022-02-24 NOTE — Telephone Encounter (Signed)
Daughter advised of pt needing to visit ER to have wound looked at and receive treatment per Dr. Ethelene Hal, says they have an appt on 7/1 but she says that is too long.

## 2022-02-24 NOTE — Telephone Encounter (Signed)
Daughters phone [phone]

## 2022-02-27 DIAGNOSIS — M179 Osteoarthritis of knee, unspecified: Secondary | ICD-10-CM | POA: Diagnosis not present

## 2022-02-27 DIAGNOSIS — Z7901 Long term (current) use of anticoagulants: Secondary | ICD-10-CM | POA: Diagnosis not present

## 2022-02-27 DIAGNOSIS — I2510 Atherosclerotic heart disease of native coronary artery without angina pectoris: Secondary | ICD-10-CM | POA: Diagnosis not present

## 2022-02-27 DIAGNOSIS — I69354 Hemiplegia and hemiparesis following cerebral infarction affecting left non-dominant side: Secondary | ICD-10-CM | POA: Diagnosis not present

## 2022-02-27 DIAGNOSIS — L89323 Pressure ulcer of left buttock, stage 3: Secondary | ICD-10-CM | POA: Diagnosis not present

## 2022-02-27 DIAGNOSIS — I4819 Other persistent atrial fibrillation: Secondary | ICD-10-CM | POA: Diagnosis not present

## 2022-02-27 DIAGNOSIS — I10 Essential (primary) hypertension: Secondary | ICD-10-CM | POA: Diagnosis not present

## 2022-02-27 DIAGNOSIS — L89023 Pressure ulcer of left elbow, stage 3: Secondary | ICD-10-CM | POA: Diagnosis not present

## 2022-02-28 ENCOUNTER — Encounter (HOSPITAL_COMMUNITY): Payer: Self-pay

## 2022-02-28 ENCOUNTER — Other Ambulatory Visit: Payer: Self-pay

## 2022-02-28 ENCOUNTER — Ambulatory Visit (HOSPITAL_BASED_OUTPATIENT_CLINIC_OR_DEPARTMENT_OTHER): Payer: Medicare Other | Admitting: General Surgery

## 2022-02-28 ENCOUNTER — Telehealth: Payer: Self-pay | Admitting: Family Medicine

## 2022-02-28 ENCOUNTER — Emergency Department (HOSPITAL_COMMUNITY)
Admission: EM | Admit: 2022-02-28 | Discharge: 2022-02-28 | Discharge status: Against Medical Advice | Payer: Medicare Other | Attending: Emergency Medicine | Admitting: Emergency Medicine

## 2022-02-28 DIAGNOSIS — Z7401 Bed confinement status: Secondary | ICD-10-CM | POA: Diagnosis not present

## 2022-02-28 DIAGNOSIS — Z5321 Procedure and treatment not carried out due to patient leaving prior to being seen by health care provider: Secondary | ICD-10-CM | POA: Diagnosis not present

## 2022-02-28 DIAGNOSIS — M25522 Pain in left elbow: Secondary | ICD-10-CM | POA: Diagnosis not present

## 2022-02-28 DIAGNOSIS — L89159 Pressure ulcer of sacral region, unspecified stage: Secondary | ICD-10-CM | POA: Diagnosis not present

## 2022-02-28 DIAGNOSIS — L89029 Pressure ulcer of left elbow, unspecified stage: Secondary | ICD-10-CM | POA: Diagnosis not present

## 2022-02-28 DIAGNOSIS — S51002A Unspecified open wound of left elbow, initial encounter: Secondary | ICD-10-CM | POA: Diagnosis not present

## 2022-02-28 DIAGNOSIS — Z7901 Long term (current) use of anticoagulants: Secondary | ICD-10-CM | POA: Diagnosis not present

## 2022-02-28 DIAGNOSIS — I10 Essential (primary) hypertension: Secondary | ICD-10-CM | POA: Diagnosis not present

## 2022-02-28 DIAGNOSIS — L8990 Pressure ulcer of unspecified site, unspecified stage: Secondary | ICD-10-CM | POA: Diagnosis not present

## 2022-02-28 DIAGNOSIS — G894 Chronic pain syndrome: Secondary | ICD-10-CM | POA: Diagnosis not present

## 2022-02-28 LAB — CBC WITH DIFFERENTIAL/PLATELET
Abs Immature Granulocytes: 0.01 10*3/uL (ref 0.00–0.07)
Basophils Absolute: 0 10*3/uL (ref 0.0–0.1)
Basophils Relative: 1 %
Eosinophils Absolute: 0.1 10*3/uL (ref 0.0–0.5)
Eosinophils Relative: 1 %
HCT: 35.5 % — ABNORMAL LOW (ref 39.0–52.0)
Hemoglobin: 11 g/dL — ABNORMAL LOW (ref 13.0–17.0)
Immature Granulocytes: 0 %
Lymphocytes Relative: 28 %
Lymphs Abs: 1.9 10*3/uL (ref 0.7–4.0)
MCH: 30.2 pg (ref 26.0–34.0)
MCHC: 31 g/dL (ref 30.0–36.0)
MCV: 97.5 fL (ref 80.0–100.0)
Monocytes Absolute: 0.7 10*3/uL (ref 0.1–1.0)
Monocytes Relative: 11 %
Neutro Abs: 4 10*3/uL (ref 1.7–7.7)
Neutrophils Relative %: 59 %
Platelets: 179 10*3/uL (ref 150–400)
RBC: 3.64 MIL/uL — ABNORMAL LOW (ref 4.22–5.81)
RDW: 14.6 % (ref 11.5–15.5)
WBC: 6.7 10*3/uL (ref 4.0–10.5)
nRBC: 0 % (ref 0.0–0.2)

## 2022-02-28 LAB — BASIC METABOLIC PANEL
Anion gap: 10 (ref 5–15)
BUN: 15 mg/dL (ref 8–23)
CO2: 25 mmol/L (ref 22–32)
Calcium: 9 mg/dL (ref 8.9–10.3)
Chloride: 104 mmol/L (ref 98–111)
Creatinine, Ser: 0.68 mg/dL (ref 0.61–1.24)
GFR, Estimated: >60 mL/min (ref >60)
Glucose, Bld: 95 mg/dL (ref 70–99)
Potassium: 4.5 mmol/L (ref 3.5–5.1)
Sodium: 139 mmol/L (ref 135–145)

## 2022-02-28 NOTE — Telephone Encounter (Signed)
[id number] is taking pt into [facility] sometime today for his dad's wound that is having trouble healing.

## 2022-02-28 NOTE — ED Triage Notes (Signed)
Pt reports he has a bed sore on his bottom and left elbow and states it has been bleeding. He noticed it about one week ago. He lives at home with his son and is bed bound.

## 2022-02-28 NOTE — ED Notes (Signed)
Family states that the patient is leaving because he is uncomfortable and can't wait any longer

## 2022-02-28 NOTE — ED Provider Triage Note (Signed)
Emergency Medicine Provider Triage Evaluation Note  Brian [name] , a 88 y.o. male  was evaluated in triage.  Pt is accompanied by his son and is complaining of bleeding from decubitus ulcers near his sacrum and on his left elbow.  Sores are chronic and he has is managed with his numbing medicine doctor, Dr. Ethelene Hal.  He was last seen for evaluation on 01/31/2022 and he has dressing changes that are impregnated with calcium alginate.  Last night, they noticed blood in his underwear and were concerned that the ulcer has worsened and was actively bleeding.  He also has some scabbing of the left elbow.  Denies fever, chills.  Review of Systems  Positive:  Negative:   Physical Exam  BP (!) 114/45 (BP Location: Right Arm)   Pulse 66   Temp (!) 97.5 F (36.4 C)   Resp 16   Ht '5\' 8"'$  (1.727 m)   Wt 83.9 kg   SpO2 93%   BMI 28.13 kg/m  Gen:   Awake, no distress   Resp:  Normal effort  MSK:   Moves extremities without difficulty  Other:  Pressure ulcers noted to the left buttock, mildly bleeding with some surrounding erythematous changes.  2 pressure ulcers noted to the left elbow, smaller 1 that is more lateral is actively oozing venous blood  Medical Decision Making  Medically screening exam initiated at 5:00 PM.  Appropriate orders placed.  [name] was informed that the remainder of the evaluation will be completed by another provider, this initial triage assessment does not replace that evaluation, and the importance of remaining in the ED until their evaluation is complete.     Tonye Pearson, [place] 02/28/22 1703

## 2022-03-02 ENCOUNTER — Other Ambulatory Visit: Payer: Self-pay | Admitting: Family Medicine

## 2022-03-02 DIAGNOSIS — L89023 Pressure ulcer of left elbow, stage 3: Secondary | ICD-10-CM | POA: Diagnosis not present

## 2022-03-02 DIAGNOSIS — L89323 Pressure ulcer of left buttock, stage 3: Secondary | ICD-10-CM | POA: Diagnosis not present

## 2022-03-02 DIAGNOSIS — I2510 Atherosclerotic heart disease of native coronary artery without angina pectoris: Secondary | ICD-10-CM | POA: Diagnosis not present

## 2022-03-02 DIAGNOSIS — Z7901 Long term (current) use of anticoagulants: Secondary | ICD-10-CM | POA: Diagnosis not present

## 2022-03-02 DIAGNOSIS — I4819 Other persistent atrial fibrillation: Secondary | ICD-10-CM | POA: Diagnosis not present

## 2022-03-02 DIAGNOSIS — K219 Gastro-esophageal reflux disease without esophagitis: Secondary | ICD-10-CM

## 2022-03-02 DIAGNOSIS — I69354 Hemiplegia and hemiparesis following cerebral infarction affecting left non-dominant side: Secondary | ICD-10-CM | POA: Diagnosis not present

## 2022-03-06 DIAGNOSIS — I4819 Other persistent atrial fibrillation: Secondary | ICD-10-CM | POA: Diagnosis not present

## 2022-03-06 DIAGNOSIS — L89023 Pressure ulcer of left elbow, stage 3: Secondary | ICD-10-CM | POA: Diagnosis not present

## 2022-03-06 DIAGNOSIS — Z7901 Long term (current) use of anticoagulants: Secondary | ICD-10-CM | POA: Diagnosis not present

## 2022-03-06 DIAGNOSIS — I2510 Atherosclerotic heart disease of native coronary artery without angina pectoris: Secondary | ICD-10-CM | POA: Diagnosis not present

## 2022-03-06 DIAGNOSIS — L89323 Pressure ulcer of left buttock, stage 3: Secondary | ICD-10-CM | POA: Diagnosis not present

## 2022-03-06 DIAGNOSIS — I69354 Hemiplegia and hemiparesis following cerebral infarction affecting left non-dominant side: Secondary | ICD-10-CM | POA: Diagnosis not present

## 2022-03-09 DIAGNOSIS — L89323 Pressure ulcer of left buttock, stage 3: Secondary | ICD-10-CM | POA: Diagnosis not present

## 2022-03-09 DIAGNOSIS — I2510 Atherosclerotic heart disease of native coronary artery without angina pectoris: Secondary | ICD-10-CM | POA: Diagnosis not present

## 2022-03-09 DIAGNOSIS — I4819 Other persistent atrial fibrillation: Secondary | ICD-10-CM | POA: Diagnosis not present

## 2022-03-09 DIAGNOSIS — Z7901 Long term (current) use of anticoagulants: Secondary | ICD-10-CM | POA: Diagnosis not present

## 2022-03-09 DIAGNOSIS — I69354 Hemiplegia and hemiparesis following cerebral infarction affecting left non-dominant side: Secondary | ICD-10-CM | POA: Diagnosis not present

## 2022-03-09 DIAGNOSIS — L89023 Pressure ulcer of left elbow, stage 3: Secondary | ICD-10-CM | POA: Diagnosis not present

## 2022-03-14 ENCOUNTER — Emergency Department (HOSPITAL_COMMUNITY): Payer: Medicare Other

## 2022-03-14 ENCOUNTER — Encounter (HOSPITAL_BASED_OUTPATIENT_CLINIC_OR_DEPARTMENT_OTHER): Payer: Medicare Other | Attending: General Surgery | Admitting: General Surgery

## 2022-03-14 ENCOUNTER — Observation Stay (HOSPITAL_COMMUNITY)
Admission: EM | Admit: 2022-03-14 | Discharge: 2022-03-15 | Disposition: A | Discharge status: Home / Self Care | Payer: Medicare Other | Attending: Internal Medicine | Admitting: Internal Medicine

## 2022-03-14 DIAGNOSIS — Z87891 Personal history of nicotine dependence: Secondary | ICD-10-CM | POA: Diagnosis not present

## 2022-03-14 DIAGNOSIS — R4182 Altered mental status, unspecified: Secondary | ICD-10-CM | POA: Diagnosis not present

## 2022-03-14 DIAGNOSIS — F419 Anxiety disorder, unspecified: Secondary | ICD-10-CM | POA: Diagnosis not present

## 2022-03-14 DIAGNOSIS — K219 Gastro-esophageal reflux disease without esophagitis: Secondary | ICD-10-CM | POA: Insufficient documentation

## 2022-03-14 DIAGNOSIS — Z8673 Personal history of transient ischemic attack (TIA), and cerebral infarction without residual deficits: Secondary | ICD-10-CM | POA: Insufficient documentation

## 2022-03-14 DIAGNOSIS — I4819 Other persistent atrial fibrillation: Secondary | ICD-10-CM | POA: Insufficient documentation

## 2022-03-14 DIAGNOSIS — Z8679 Personal history of other diseases of the circulatory system: Secondary | ICD-10-CM

## 2022-03-14 DIAGNOSIS — Z86711 Personal history of pulmonary embolism: Secondary | ICD-10-CM | POA: Insufficient documentation

## 2022-03-14 DIAGNOSIS — R300 Dysuria: Secondary | ICD-10-CM | POA: Diagnosis not present

## 2022-03-14 DIAGNOSIS — I517 Cardiomegaly: Secondary | ICD-10-CM | POA: Diagnosis not present

## 2022-03-14 DIAGNOSIS — I69954 Hemiplegia and hemiparesis following unspecified cerebrovascular disease affecting left non-dominant side: Secondary | ICD-10-CM | POA: Diagnosis not present

## 2022-03-14 DIAGNOSIS — K5730 Diverticulosis of large intestine without perforation or abscess without bleeding: Secondary | ICD-10-CM | POA: Diagnosis not present

## 2022-03-14 DIAGNOSIS — F329 Major depressive disorder, single episode, unspecified: Secondary | ICD-10-CM | POA: Diagnosis not present

## 2022-03-14 DIAGNOSIS — M25522 Pain in left elbow: Secondary | ICD-10-CM | POA: Diagnosis not present

## 2022-03-14 DIAGNOSIS — E785 Hyperlipidemia, unspecified: Secondary | ICD-10-CM | POA: Insufficient documentation

## 2022-03-14 DIAGNOSIS — J90 Pleural effusion, not elsewhere classified: Secondary | ICD-10-CM | POA: Insufficient documentation

## 2022-03-14 DIAGNOSIS — I2510 Atherosclerotic heart disease of native coronary artery without angina pectoris: Secondary | ICD-10-CM | POA: Insufficient documentation

## 2022-03-14 DIAGNOSIS — I10 Essential (primary) hypertension: Secondary | ICD-10-CM | POA: Insufficient documentation

## 2022-03-14 DIAGNOSIS — N401 Enlarged prostate with lower urinary tract symptoms: Secondary | ICD-10-CM

## 2022-03-14 DIAGNOSIS — L89023 Pressure ulcer of left elbow, stage 3: Secondary | ICD-10-CM | POA: Insufficient documentation

## 2022-03-14 DIAGNOSIS — Z86718 Personal history of other venous thrombosis and embolism: Secondary | ICD-10-CM | POA: Diagnosis not present

## 2022-03-14 DIAGNOSIS — R109 Unspecified abdominal pain: Secondary | ICD-10-CM | POA: Insufficient documentation

## 2022-03-14 DIAGNOSIS — R55 Syncope and collapse: Principal | ICD-10-CM | POA: Insufficient documentation

## 2022-03-14 DIAGNOSIS — N281 Cyst of kidney, acquired: Secondary | ICD-10-CM | POA: Diagnosis not present

## 2022-03-14 DIAGNOSIS — K7689 Other specified diseases of liver: Secondary | ICD-10-CM | POA: Diagnosis not present

## 2022-03-14 DIAGNOSIS — Z951 Presence of aortocoronary bypass graft: Secondary | ICD-10-CM | POA: Insufficient documentation

## 2022-03-14 DIAGNOSIS — L8990 Pressure ulcer of unspecified site, unspecified stage: Secondary | ICD-10-CM | POA: Diagnosis not present

## 2022-03-14 DIAGNOSIS — E782 Mixed hyperlipidemia: Secondary | ICD-10-CM | POA: Diagnosis present

## 2022-03-14 DIAGNOSIS — Z96649 Presence of unspecified artificial hip joint: Secondary | ICD-10-CM | POA: Insufficient documentation

## 2022-03-14 DIAGNOSIS — I6300 Cerebral infarction due to thrombosis of unspecified precerebral artery: Secondary | ICD-10-CM

## 2022-03-14 DIAGNOSIS — Z7901 Long term (current) use of anticoagulants: Secondary | ICD-10-CM | POA: Insufficient documentation

## 2022-03-14 LAB — CBC WITH DIFFERENTIAL/PLATELET
Abs Immature Granulocytes: 0.05 10*3/uL (ref 0.00–0.07)
Basophils Absolute: 0 10*3/uL (ref 0.0–0.1)
Basophils Relative: 0 %
Eosinophils Absolute: 0 10*3/uL (ref 0.0–0.5)
Eosinophils Relative: 0 %
HCT: 33.9 % — ABNORMAL LOW (ref 39.0–52.0)
Hemoglobin: 10.5 g/dL — ABNORMAL LOW (ref 13.0–17.0)
Immature Granulocytes: 0 %
Lymphocytes Relative: 9 %
Lymphs Abs: 1 10*3/uL (ref 0.7–4.0)
MCH: 29.7 pg (ref 26.0–34.0)
MCHC: 31 g/dL (ref 30.0–36.0)
MCV: 95.8 fL (ref 80.0–100.0)
Monocytes Absolute: 1.1 10*3/uL — ABNORMAL HIGH (ref 0.1–1.0)
Monocytes Relative: 10 %
Neutro Abs: 8.9 10*3/uL — ABNORMAL HIGH (ref 1.7–7.7)
Neutrophils Relative %: 81 %
Platelets: 237 10*3/uL (ref 150–400)
RBC: 3.54 MIL/uL — ABNORMAL LOW (ref 4.22–5.81)
RDW: 13.4 % (ref 11.5–15.5)
WBC: 11.1 10*3/uL — ABNORMAL HIGH (ref 4.0–10.5)
nRBC: 0 % (ref 0.0–0.2)

## 2022-03-14 LAB — COMPREHENSIVE METABOLIC PANEL
ALT: 16 U/L (ref 0–44)
AST: 25 U/L (ref 15–41)
Albumin: 3.2 g/dL — ABNORMAL LOW (ref 3.5–5.0)
Alkaline Phosphatase: 68 U/L (ref 38–126)
Anion gap: 7 (ref 5–15)
BUN: 18 mg/dL (ref 8–23)
CO2: 26 mmol/L (ref 22–32)
Calcium: 8.8 mg/dL — ABNORMAL LOW (ref 8.9–10.3)
Chloride: 104 mmol/L (ref 98–111)
Creatinine, Ser: 0.98 mg/dL (ref 0.61–1.24)
GFR, Estimated: >60 mL/min (ref >60)
Glucose, Bld: 115 mg/dL — ABNORMAL HIGH (ref 70–99)
Potassium: 3.7 mmol/L (ref 3.5–5.1)
Sodium: 137 mmol/L (ref 135–145)
Total Bilirubin: 0.8 mg/dL (ref 0.3–1.2)
Total Protein: 6.3 g/dL — ABNORMAL LOW (ref 6.5–8.1)

## 2022-03-14 LAB — LIPASE, BLOOD: Lipase: 23 U/L (ref 11–51)

## 2022-03-14 LAB — BRAIN NATRIURETIC PEPTIDE: B Natriuretic Peptide: 158.2 pg/mL — ABNORMAL HIGH (ref 0.0–100.0)

## 2022-03-14 LAB — POC OCCULT BLOOD, ED: Fecal Occult Bld: POSITIVE — AB

## 2022-03-14 LAB — PROTIME-INR
INR: 1.4 — ABNORMAL HIGH (ref 0.8–1.2)
Prothrombin Time: 17.2 seconds — ABNORMAL HIGH (ref 11.4–15.2)

## 2022-03-14 LAB — TYPE AND SCREEN
ABO/RH(D): A POS
Antibody Screen: NEGATIVE

## 2022-03-14 LAB — TROPONIN I (HIGH SENSITIVITY)
Troponin I (High Sensitivity): 14 ng/L (ref <18)
Troponin I (High Sensitivity): 10 ng/L (ref <18)

## 2022-03-14 MED ORDER — LACTATED RINGERS IV BOLUS
1000.0000 mL | Freq: Once | INTRAVENOUS | Status: AC
  Administered 2022-03-14: 1000 mL via INTRAVENOUS

## 2022-03-14 NOTE — ED Triage Notes (Signed)
Pt BIB [facility]. Son called stating pt had :passed out." while in the bathroom.Upon arrival, pt found to be pale, diaphoretic, and hypotensive with intermittent confusion.  No falls. Alert and oriented x 4 at this time and answers questions appropriately.

## 2022-03-14 NOTE — ED Notes (Signed)
Patient is resting comfortably. 

## 2022-03-14 NOTE — ED Provider Notes (Signed)
[facility] EMERGENCY DEPARTMENT Provider Note   CSN: [id number] Arrival date & time: 03/14/22  1812     History {Add pertinent medical, surgical, social history, OB history to HPI:1} Chief Complaint  Patient presents with   syncope    Pt BIB [facility]. Son called stating pt had :passed out." while in the bathroom.Upon arrival, pt found to be pale, diaphoretic, and hypotensive with intermittent confusion.  No falls. Alert and oriented x 4 at this time and answers questions appropriately.    Brian [name] is a 88 y.o. male.  Patient with history of previous stroke, CABG, PE on anticoagulation, TIA, atrial flutter, prostate hypertrophy presenting from home with syncopal episode.  EMS states they were called out for unresponsive patient who was found passed out in his recliner.  On arrival he was pale, diaphoretic and hypotensive in the 50s.  EMS could not feel radial pulses initially.  They initiated IV fluids.  He did not fall or hit his head.  On arrival here he is oriented to person and place.  Complaining of pain to his buttock and left elbow where he has chronic wounds.  Denies falling or hitting his head today.  No neck pain.  No chest pain or shortness of breath.  No abdominal pain, nausea, vomiting, fever.  No black or bloody stools  The history is provided by the patient.       Home Medications Prior to Admission medications   Medication Sig Start Date End Date Taking? Authorizing Provider  acetaminophen (TYLENOL) 500 MG tablet Take 500 mg by mouth every 6 (six) hours as needed.    [provider]  ALPRAZolam Duanne Moron) 0.25 MG tablet 2 (two) times daily.    [provider]  apixaban (ELIQUIS) 2.5 MG TABS tablet TAKE 1 TABLET(2.5 MG) BY MOUTH TWICE DAILY 08/02/21   Libby Maw, MD  cetirizine (ZYRTEC) 10 MG tablet TAKE 1 TABLET(10 MG) BY MOUTH AT BEDTIME 09/16/21   Libby Maw, MD  finasteride (PROSCAR) 5 MG tablet TAKE 1  TABLET(5 MG) BY MOUTH DAILY 01/09/22   Libby Maw, MD  hydrocortisone ointment 0.5 % hydrocortisone 1 % topical cream  APPLY A THIN LAYER TO THE AFFECTED AREA(S) BY TOPICAL ROUTE 2 TIMES PER DAY    [provider]  losartan (COZAAR) 25 MG tablet TAKE 1 TABLET(25 MG) BY MOUTH DAILY 08/08/21   Skeet Latch, MD  Melatonin 1 MG CAPS melatonin 5 mg tablet  Take 1 tablet every day by oral route at bedtime.    [provider]  metoprolol succinate (TOPROL-XL) 25 MG 24 hr tablet TAKE 1 TABLET(25 MG) BY MOUTH DAILY 06/30/21   Libby Maw, MD  Multiple Vitamin (MULTIVITAMIN) tablet Take 2 tablets by mouth daily.    [provider]  MUPIROCIN EX mupirocin 2 % topical ointment  APPLY A SMALL AMOUNT TO THE AFFECTED AREA BY TOPICAL ROUTE 3 TIMES PER DAY    [provider]  oxyCODONE-acetaminophen (PERCOCET) 7.5-325 MG tablet Take 1 tablet by mouth every 8 (eight) hours as needed for severe pain. 12/27/21   Libby Maw, MD  pantoprazole (PROTONIX) 40 MG tablet TAKE 1 TABLET(40 MG) BY MOUTH DAILY 03/02/22   Libby Maw, MD  polyethylene glycol powder [facility]) 17 GM/SCOOP powder Mix one scoop with water and take daily until stools loosen. May repeat as needed. 01/20/20   Libby Maw, MD  pravastatin (PRAVACHOL) 40 MG tablet TAKE 1 TABLET(40 MG) BY  MOUTH DAILY 11/11/21   Libby Maw, MD  pregabalin (LYRICA) 25 MG capsule Take 1 capsule (25 mg total) by mouth 3 (three) times daily. Patient taking differently: Take 25 mg by mouth 2 (two) times daily. 09/09/21   Libby Maw, MD  QUEtiapine (SEROQUEL) 50 MG tablet TAKE 1 TABLET(50 MG) BY MOUTH AT BEDTIME 02/15/22   Libby Maw, MD  sertraline (ZOLOFT) 50 MG tablet TAKE 1 TABLET(50 MG) BY MOUTH EVERY MORNING 12/06/21   Libby Maw, MD  tamsulosin [facility]) 0.4 MG CAPS capsule TAKE 1 CAPSULE(0.4 MG) BY MOUTH DAILY 08/23/21   Libby Maw, MD      Allergies    Novocain [procaine hcl], Other, and Penicillins    Review of Systems   Review of Systems  Constitutional:  Negative for fever.  HENT:  Negative for congestion.   Respiratory:  Negative for cough and shortness of breath.   Cardiovascular:  Negative for chest pain.  Genitourinary:  Negative for dysuria and hematuria.  Musculoskeletal:  Negative for arthralgias and myalgias.  Skin:  Positive for wound.  Neurological:  Positive for syncope and weakness.   all other systems are negative except as noted in the HPI and PMH.    Physical Exam Updated Vital Signs BP (!) 101/58   Pulse (!) 55   Temp 97.8 F (36.6 C)   Resp 14   SpO2 92%  Physical Exam Vitals and nursing note reviewed.  Constitutional:      General: He is not in acute distress.    Appearance: He is well-developed. He is ill-appearing.     Comments: Chronically ill-appearing  HENT:     Head: Normocephalic and atraumatic.     Mouth/Throat:     Pharynx: No oropharyngeal exudate.  Eyes:     Conjunctiva/sclera: Conjunctivae normal.     Pupils: Pupils are equal, round, and reactive to light.  Neck:     Comments: No meningismus. Cardiovascular:     Rate and Rhythm: Normal rate and regular rhythm.     Heart sounds: Normal heart sounds. No murmur heard. Pulmonary:     Effort: Pulmonary effort is normal. No respiratory distress.     Breath sounds: Normal breath sounds.  Chest:     Chest wall: No tenderness.  Abdominal:     Palpations: Abdomen is soft.     Tenderness: There is no abdominal tenderness. There is no guarding or rebound.  Genitourinary:    Comments: No gross blood, no hemorrhoids or fissures Musculoskeletal:        General: Signs of injury present. No tenderness. Normal range of motion.     Cervical back: Normal range of motion and neck supple.     Comments: Open wound to left olecranon as depicted.  Mild surrounding erythema.  Skin:    General: Skin is warm.   Neurological:     Mental Status: He is alert and oriented to person, place, and time.     Cranial Nerves: No cranial nerve deficit.     Motor: No abnormal muscle tone.     Coordination: Coordination normal.     Comments:  5/5 strength throughout. CN 2-12 intact.Equal grip strength.   Psychiatric:        Behavior: Behavior normal.     ED Results / Procedures / Treatments   Labs (all labs ordered are listed, but only abnormal results are displayed) Labs Reviewed  CBC WITH DIFFERENTIAL/PLATELET - Abnormal; Notable for the following components:  Result Value   WBC 11.1 (*)    RBC 3.54 (*)    Hemoglobin 10.5 (*)    HCT 33.9 (*)    Neutro Abs 8.9 (*)    Monocytes Absolute 1.1 (*)    All other components within normal limits  COMPREHENSIVE METABOLIC PANEL - Abnormal; Notable for the following components:   Glucose, Bld 115 (*)    Calcium 8.8 (*)    Total Protein 6.3 (*)    Albumin 3.2 (*)    All other components within normal limits  PROTIME-INR - Abnormal; Notable for the following components:   Prothrombin Time 17.2 (*)    INR 1.4 (*)    All other components within normal limits  BRAIN NATRIURETIC PEPTIDE - Abnormal; Notable for the following components:   B Natriuretic Peptide 158.2 (*)    All other components within normal limits  POC OCCULT BLOOD, ED - Abnormal; Notable for the following components:   Fecal Occult Bld POSITIVE (*)    All other components within normal limits  LIPASE, BLOOD  TYPE AND SCREEN  TROPONIN I (HIGH SENSITIVITY)  TROPONIN I (HIGH SENSITIVITY)    EKG EKG Interpretation  Date/Time:  Tuesday March 14 2022 18:57:22 EDT Ventricular Rate:  63 PR Interval:    QRS Duration: 138 QT Interval:  474 QTC Calculation: 486 R Axis:   -51 Text Interpretation: Atrial fibrillation Left bundle branch block No significant change was found Confirmed by Ezequiel Essex [phone]) on 03/14/2022 7:03:10 PM  Radiology DG Elbow Complete Left  Result Date:  03/14/2022 CLINICAL DATA:  Left elbow pain after syncope with fall. Wound on the posterior elbow. EXAM: LEFT ELBOW - COMPLETE 3+ VIEW COMPARISON:  None Available. FINDINGS: Diffuse bone demineralization. No evidence of acute fracture or dislocation of the left elbow. No focal bone lesion or bone destruction. No significant effusion. Soft tissue swelling over the posterior left elbow consistent with history of wound, likely contusion. IMPRESSION: Posterior soft tissue swelling.  No acute fracture or dislocation. Electronically Signed   By: Lucienne Capers M.D.   On: 03/14/2022 19:05   DG Chest Portable 1 View  Result Date: 03/14/2022 CLINICAL DATA:  Syncope.  Found on the floor.  Pale. EXAM: PORTABLE CHEST 1 VIEW COMPARISON:  11/03/2018 FINDINGS: Postoperative changes in the mediastinum. Cardiac valve prosthesis. Cardiac enlargement. No vascular congestion. Small left pleural effusion with basilar infiltration or atelectasis. No pneumothorax. Degenerative changes in the spine and shoulders. IMPRESSION: Cardiac enlargement. Small left pleural effusion with basilar atelectasis or infiltration. Electronically Signed   By: Lucienne Capers M.D.   On: 03/14/2022 19:04    Procedures Procedures  {Document cardiac monitor, telemetry assessment procedure when appropriate:1}  Medications Ordered in ED Medications  lactated ringers bolus 1,000 mL (has no administration in time range)    ED Course/ Medical Decision Making/ A&P                           Medical Decision Making Amount and/or Complexity of Data Reviewed Labs: ordered. Decision-making details documented in ED Course. Radiology: ordered and independent interpretation performed. Decision-making details documented in ED Course. ECG/medicine tests: ordered and independent interpretation performed. Decision-making details documented in ED Course.  Syncopal episode with hypotensive in the field.  Unclear that patient actually lost pulses.  On  arrival he was pale, diaphoretic and hypotensive.  Here he is awake and alert  Patient given IV fluids.  Blood pressure is in the 90s to  614 range systolic.  Heart rate is in the 60s and 70s.  His rate is controlled.  Laboratory work-up is reassuring.  Hemoglobin is stable.  Hemoccult is positive but patient does have hemorrhoids.  No evidence of obvious GI bleed.  Electrolytes are at baseline, creatinine is at baseline  {Document critical care time when appropriate:1} {Document review of labs and clinical decision tools ie heart score, Chads2Vasc2 etc:1}  {Document your independent review of radiology images, and any outside records:1} {Document your discussion with family members, caretakers, and with consultants:1} {Document social determinants of health affecting pt's care:1} {Document your decision making why or why not admission, treatments were needed:1} Final Clinical Impression(s) / ED Diagnoses Final diagnoses:  None    Rx / DC Orders ED Discharge Orders     None

## 2022-03-14 NOTE — ED Notes (Signed)
Patient transported to CT 

## 2022-03-14 NOTE — Progress Notes (Signed)
Brian [name], Brian [name] ([id number]) Visit Report for 03/14/2022 Arrival Information Details Patient Name: Date of Service: [name] ID M. 03/14/2022 2:30 PM Medical Record Number: [id number] Patient Account Number: [id number] Date of Birth/Sex: Treating RN: Mar 20, 1933 (88 y.o. [name] Primary Care Kemet Nijjar: Abelino Derrick Other Clinician: Referring Nandita Mathenia: Treating Galia Rahm/Extender: Sandi Raveling in Treatment: 4 Visit Information History Since Last Visit All ordered tests and consults were completed: Yes Patient Arrived: Wheel Chair Added or deleted any medications: No Arrival Time: 14:43 Any new allergies or adverse reactions: No Accompanied By: son, caregiver Had a fall or experienced change in No Transfer Assistance: Manual activities of daily living that may affect Patient Identification Verified: Yes risk of falls: Secondary Verification Process Completed: Yes Hospitalized since last visit: No Patient Requires Transmission-Based Precautions: No Implantable device outside of the clinic excluding No Patient Has Alerts: Yes cellular tissue based products placed in the center Patient Alerts: Patient on Blood Thinner since last visit: Pain Present Now: Yes Electronic Signature(s) Signed: 03/14/2022 4:17:27 PM By: Blanche East RN Entered By: Blanche East on 03/14/2022 14:44:39 -------------------------------------------------------------------------------- Encounter Discharge Information Details Patient Name: Date of Service: [name] ID M. 03/14/2022 2:30 PM Medical Record Number: [id number] Patient Account Number: [id number] Date of Birth/Sex: Treating RN: May 18, 1933 (89 y.o. [name] Primary Care Anneliese Leblond: Abelino Derrick Other Clinician: Referring Analynn Daum: Treating Angeletta Goelz/Extender: Sandi Raveling in Treatment: 4 Encounter Discharge Information Items Post Procedure Vitals Discharge  Condition: Stable Temperature (F): 98.1 Ambulatory Status: Wheelchair Pulse (bpm): 102 Discharge Destination: Home Respiratory Rate (breaths/min): 20 Transportation: Private Auto Blood Pressure (mmHg): 112/47 Accompanied By: son Schedule Follow-up Appointment: Yes Clinical Summary of Care: Patient Declined Electronic Signature(s) Signed: 03/14/2022 4:17:27 PM By: Blanche East RN Entered By: Blanche East on 03/14/2022 15:20:29 -------------------------------------------------------------------------------- Lower Extremity Assessment Details Patient Name: Date of Service: [name] ID M. 03/14/2022 2:30 PM Medical Record Number: [id number] Patient Account Number: [id number] Date of Birth/Sex: Treating RN: Sep 30, 1932 (88 y.o. [name] Primary Care Nila Winker: Abelino Derrick Other Clinician: Referring Arlington Sigmund: Treating Nyana Haren/Extender: Sandi Raveling in Treatment: 4 Electronic Signature(s) Signed: 03/14/2022 3:38:24 PM By: Dellie Catholic RN Signed: 03/14/2022 4:17:27 PM By: Blanche East RN Entered By: Blanche East on 03/14/2022 14:47:11 -------------------------------------------------------------------------------- Multi Wound Chart Details Patient Name: Date of Service: [name] ID M. 03/14/2022 2:30 PM Medical Record Number: [id number] Patient Account Number: [id number] Date of Birth/Sex: Treating RN: 1932/10/13 (88 y.o. [name] Primary Care Makyra Corprew: Abelino Derrick Other Clinician: Referring Mahlani Berninger: Treating Moon Budde/Extender: Sandi Raveling in Treatment: 4 Vital Signs Height(in): 66 Pulse(bpm): 102 Weight(lbs): 185 Blood Pressure(mmHg): 112/47 Body Mass Index(BMI): 29.9 Temperature(F): 98.1 Respiratory Rate(breaths/min): 20 Photos: [N/A:N/A] Left Elbow N/A N/A Wound Location: Pressure Injury N/A N/A Wounding Event: Pressure Ulcer N/A N/A Primary Etiology: Cataracts,  Arrhythmia, Coronary N/A N/A Comorbid History: Artery Disease, Hypotension, Osteoarthritis 12/19/2021 N/A N/A Date Acquired: 4 N/A N/A Weeks of Treatment: Open N/A N/A Wound Status: No N/A N/A Wound Recurrence: Yes N/A N/A Clustered Wound: 3.3x3x0.1 N/A N/A Measurements L x W x D (cm) 7.775 N/A N/A A (cm) : rea 0.778 N/A N/A Volume (cm) : 19.20% N/A N/A % Reduction in A rea: 19.10% N/A N/A % Reduction in Volume: Category/Stage III N/A N/A Classification: Medium N/A N/A Exudate A mount: Serosanguineous N/A N/A Exudate Type: red, brown N/A N/A Exudate Color: Distinct, outline attached N/A N/A Wound Margin:  Small (1-33%) N/A N/A Granulation Amount: Red N/A N/A Granulation Quality: Large (67-100%) N/A N/A Necrotic Amount: Fat Layer (Subcutaneous Tissue): Yes N/A N/A Exposed Structures: Fascia: No Tendon: No Muscle: No Joint: No Bone: No Small (1-33%) N/A N/A Epithelialization: Debridement - Excisional N/A N/A Debridement: Pre-procedure Verification/Time Out 15:04 N/A N/A Taken: Lidocaine 4% Topical Solution N/A N/A Pain Control: Subcutaneous, Slough N/A N/A Tissue Debrided: Skin/Subcutaneous Tissue N/A N/A Level: 9.9 N/A N/A Debridement A (sq cm): rea Curette N/A N/A Instrument: Minimum N/A N/A Bleeding: Pressure N/A N/A Hemostasis A chieved: 0 N/A N/A Procedural Pain: 0 N/A N/A Post Procedural Pain: Procedure was tolerated well N/A N/A Debridement Treatment Response: 3.3x3x0.1 N/A N/A Post Debridement Measurements L x W x D (cm) 0.778 N/A N/A Post Debridement Volume: (cm) Category/Stage III N/A N/A Post Debridement Stage: Debridement N/A N/A Procedures Performed: Treatment Notes Electronic Signature(s) Signed: 03/14/2022 3:14:47 PM By: Fredirick Maudlin MD FACS Signed: 03/14/2022 3:38:24 PM By: Dellie Catholic RN Entered By: Fredirick Maudlin on 03/14/2022  15:14:47 -------------------------------------------------------------------------------- Multi-Disciplinary Care Plan Details Patient Name: Date of Service: [name] ID M. 03/14/2022 2:30 PM Medical Record Number: [id number] Patient Account Number: [id number] Date of Birth/Sex: Treating RN: 1933/05/31 (88 y.o. [name] Primary Care Anshul Meddings: Abelino Derrick Other Clinician: Referring Vertis Bauder: Treating Leven Hoel/Extender: Sandi Raveling in Treatment: 4 Active Inactive Abuse / Safety / Falls / Self Care Management Nursing Diagnoses: Impaired physical mobility Potential for falls Goals: Patient/caregiver will verbalize understanding of skin care regimen Date Initiated: 02/14/2022 Target Resolution Date: 03/17/2022 Goal Status: Active Patient/caregiver will verbalize/demonstrate measures taken to prevent injury and/or falls Date Initiated: 02/14/2022 Target Resolution Date: 03/17/2022 Goal Status: Active Interventions: Assess fall risk on admission and as needed Assess self care needs on admission and as needed Notes: Wound/Skin Impairment Nursing Diagnoses: Impaired tissue integrity Knowledge deficit related to ulceration/compromised skin integrity Goals: Patient/caregiver will verbalize understanding of skin care regimen Date Initiated: 02/14/2022 Target Resolution Date: 03/17/2022 Goal Status: Active Ulcer/skin breakdown will have a volume reduction of 30% by week 4 Date Initiated: 02/14/2022 Target Resolution Date: 03/17/2022 Goal Status: Active Interventions: Assess patient/caregiver ability to obtain necessary supplies Assess patient/caregiver ability to perform ulcer/skin care regimen upon admission and as needed Assess ulceration(s) every visit Treatment Activities: Skin care regimen initiated : 02/14/2022 Topical wound management initiated : 02/14/2022 Notes: Electronic Signature(s) Signed: 03/14/2022 3:38:24 PM By: Dellie Catholic RN Signed: 03/14/2022 4:17:27 PM By: Blanche East RN Entered By: Blanche East on 03/14/2022 15:01:38 -------------------------------------------------------------------------------- Pain Assessment Details Patient Name: Date of Service: [name] ID M. 03/14/2022 2:30 PM Medical Record Number: [id number] Patient Account Number: [id number] Date of Birth/Sex: Treating RN: November 29, 1932 (88 y.o. [name] Primary Care Aubra Pappalardo: Abelino Derrick Other Clinician: Referring Kingston Shawgo: Treating Shamal Stracener/Extender: Sandi Raveling in Treatment: 4 Active Problems Location of Pain Severity and Description of Pain Patient Has Paino Yes Site Locations With Dressing Change: Yes Rate the pain. Current Pain Level: 7 Character of Pain Describe the Pain: Tender Pain Management and Medication Current Pain Management: Electronic Signature(s) Signed: 03/14/2022 3:38:24 PM By: Dellie Catholic RN Signed: 03/14/2022 4:17:27 PM By: Blanche East RN Entered By: Blanche East on 03/14/2022 14:46:09 -------------------------------------------------------------------------------- Patient/Caregiver Education Details Patient Name: Date of Service: [name] ID M. 7/25/2023andnbsp2:30 PM Medical Record Number: [id number] Patient Account Number: [id number] Date of Birth/Gender: Treating RN: [birth year] (88 y.o. [name] Primary Care Physician: Abelino Derrick Other Clinician: Referring Physician:  Treating Physician/Extender: Sandi Raveling in Treatment: 4 Education Assessment Education Provided To: Patient Education Topics Provided Wound/Skin Impairment: Methods: Explain/Verbal Responses: Reinforcements needed, State content correctly Electronic Signature(s) Signed: 03/14/2022 4:17:27 PM By: Blanche East RN Entered By: Blanche East on 03/14/2022  15:02:15 -------------------------------------------------------------------------------- Wound Assessment Details Patient Name: Date of Service: [name] ID M. 03/14/2022 2:30 PM Medical Record Number: [id number] Patient Account Number: [id number] Date of Birth/Sex: Treating RN: 11-Jul-1933 (89 y.o. [name] Primary Care Jillienne Egner: Abelino Derrick Other Clinician: Referring Hortensia Duffin: Treating Janaiyah Blackard/Extender: Sandi Raveling in Treatment: 4 Wound Status Wound Number: 1 Primary Pressure Ulcer Etiology: Wound Location: Left Elbow Wound Status: Open Wounding Event: Pressure Injury Comorbid Cataracts, Arrhythmia, Coronary Artery Disease, Date Acquired: 12/19/2021 History: Hypotension, Osteoarthritis Weeks Of Treatment: 4 Clustered Wound: Yes Photos Wound Measurements Length: (cm) 3.3 Width: (cm) 3 Depth: (cm) 0.1 Area: (cm) 7.775 Volume: (cm) 0.778 % Reduction in Area: 19.2% % Reduction in Volume: 19.1% Epithelialization: Small (1-33%) Tunneling: No Undermining: No Wound Description Classification: Category/Stage III Wound Margin: Distinct, outline attached Exudate Amount: Medium Exudate Type: Serosanguineous Exudate Color: red, brown Foul Odor After Cleansing: No Slough/Fibrino Yes Wound Bed Granulation Amount: Small (1-33%) Exposed Structure Granulation Quality: Red Fascia Exposed: No Necrotic Amount: Large (67-100%) Fat Layer (Subcutaneous Tissue) Exposed: Yes Necrotic Quality: Adherent Slough Tendon Exposed: No Muscle Exposed: No Joint Exposed: No Bone Exposed: No Treatment Notes Wound #1 (Elbow) Wound Laterality: Left Cleanser Soap and Water Discharge Instruction: May shower and wash wound with dial antibacterial soap and water prior to dressing change. Wound Cleanser Discharge Instruction: Cleanse the wound with wound cleanser prior to applying a clean dressing using gauze sponges, not tissue or cotton  balls. Peri-Wound Care Topical Primary Dressing KerraCel Ag Gelling Fiber Dressing, 4x5 in (silver alginate) Discharge Instruction: Apply silver alginate to wound bed as instructed Santyl Ointment Discharge Instruction: Apply nickel thick amount to wound bed as instructed Secondary Dressing ALLEVYN Gentle Border, 5x5 (in/in) Discharge Instruction: Apply over primary dressing as directed. ALLEVYN Heel 4 1/2in x 5 1/2in / 10.5cm x 13.5cm Discharge Instruction: Apply over primary dressing as directed. Secured With [employer], 4.5x3.1 (in/yd) Discharge Instruction: Secure with Kerlix as directed. 51M Medipore H Soft Cloth Surgical T ape, 4 x 10 (in/yd) Discharge Instruction: Secure with tape as directed. Compression Wrap Compression Stockings Add-Ons Electronic Signature(s) Signed: 03/14/2022 3:38:24 PM By: Dellie Catholic RN Signed: 03/14/2022 4:17:27 PM By: Blanche East RN Entered By: Blanche East on 03/14/2022 14:57:28 -------------------------------------------------------------------------------- Vitals Details Patient Name: Date of Service: [name] ID M. 03/14/2022 2:30 PM Medical Record Number: [id number] Patient Account Number: [id number] Date of Birth/Sex: Treating RN: 02/18/1933 (88 y.o. [name] Primary Care Crystalyn Delia: Abelino Derrick Other Clinician: Referring Jakeline Dave: Treating Abree Romick/Extender: Sandi Raveling in Treatment: 4 Vital Signs Time Taken: 14:44 Temperature (F): 98.1 Height (in): 66 Pulse (bpm): 102 Weight (lbs): 185 Respiratory Rate (breaths/min): 20 Body Mass Index (BMI): 29.9 Blood Pressure (mmHg): 112/47 Reference Range: 80 - 120 mg / dl Electronic Signature(s) Signed: 03/14/2022 4:17:27 PM By: Blanche East RN Entered By: Blanche East on 03/14/2022 14:45:41

## 2022-03-15 ENCOUNTER — Other Ambulatory Visit: Payer: Self-pay

## 2022-03-15 ENCOUNTER — Observation Stay (HOSPITAL_BASED_OUTPATIENT_CLINIC_OR_DEPARTMENT_OTHER): Payer: Medicare Other

## 2022-03-15 ENCOUNTER — Encounter (HOSPITAL_COMMUNITY): Payer: Self-pay | Admitting: Internal Medicine

## 2022-03-15 DIAGNOSIS — N281 Cyst of kidney, acquired: Secondary | ICD-10-CM | POA: Diagnosis not present

## 2022-03-15 DIAGNOSIS — R4182 Altered mental status, unspecified: Secondary | ICD-10-CM | POA: Diagnosis not present

## 2022-03-15 DIAGNOSIS — L8990 Pressure ulcer of unspecified site, unspecified stage: Secondary | ICD-10-CM | POA: Insufficient documentation

## 2022-03-15 DIAGNOSIS — R55 Syncope and collapse: Secondary | ICD-10-CM | POA: Diagnosis not present

## 2022-03-15 DIAGNOSIS — K7689 Other specified diseases of liver: Secondary | ICD-10-CM | POA: Diagnosis not present

## 2022-03-15 DIAGNOSIS — I2510 Atherosclerotic heart disease of native coronary artery without angina pectoris: Secondary | ICD-10-CM | POA: Diagnosis not present

## 2022-03-15 DIAGNOSIS — R300 Dysuria: Secondary | ICD-10-CM

## 2022-03-15 DIAGNOSIS — K5730 Diverticulosis of large intestine without perforation or abscess without bleeding: Secondary | ICD-10-CM | POA: Diagnosis not present

## 2022-03-15 DIAGNOSIS — K219 Gastro-esophageal reflux disease without esophagitis: Secondary | ICD-10-CM

## 2022-03-15 LAB — CBC
HCT: 32.5 % — ABNORMAL LOW (ref 39.0–52.0)
Hemoglobin: 10.1 g/dL — ABNORMAL LOW (ref 13.0–17.0)
MCH: 29.4 pg (ref 26.0–34.0)
MCHC: 31.1 g/dL (ref 30.0–36.0)
MCV: 94.5 fL (ref 80.0–100.0)
Platelets: 199 10*3/uL (ref 150–400)
RBC: 3.44 MIL/uL — ABNORMAL LOW (ref 4.22–5.81)
RDW: 13.4 % (ref 11.5–15.5)
WBC: 8.6 10*3/uL (ref 4.0–10.5)
nRBC: 0 % (ref 0.0–0.2)

## 2022-03-15 LAB — ECHOCARDIOGRAM COMPLETE
AR max vel: 2.57 cm2
AV Peak grad: 9.5 mmHg
Ao pk vel: 1.54 m/s
Area-P 1/2: 5.09 cm2
Calc EF: 59 %
Height: 68 in
P 1/2 time: 558 msec
S' Lateral: 3 cm
Single Plane A2C EF: 57.6 %
Single Plane A4C EF: 58.4 %
Weight: 2960 oz

## 2022-03-15 LAB — LACTIC ACID, PLASMA
Lactic Acid, Venous: 1.1 mmol/L (ref 0.5–1.9)
Lactic Acid, Venous: 1.9 mmol/L (ref 0.5–1.9)

## 2022-03-15 MED ORDER — CEFADROXIL 500 MG PO CAPS: 500.0000 mg | ORAL_CAPSULE | Freq: Two times a day (BID) | ORAL | Status: DC

## 2022-03-15 MED ORDER — PANTOPRAZOLE SODIUM 40 MG PO TBEC: 40.0000 mg | DELAYED_RELEASE_TABLET | Freq: Every day | ORAL | Status: DC

## 2022-03-15 MED ORDER — PRAVASTATIN SODIUM 40 MG PO TABS: 40.0000 mg | ORAL_TABLET | Freq: Every day | ORAL | Status: DC

## 2022-03-15 MED ORDER — TAMSULOSIN HCL 0.4 MG PO CAPS: 0.4000 mg | ORAL_CAPSULE | Freq: Every day | ORAL | Status: DC

## 2022-03-15 MED ORDER — MELATONIN 5 MG PO TABS: 5.0000 mg | ORAL_TABLET | Freq: Every evening | ORAL | Status: DC | PRN

## 2022-03-15 MED ORDER — SERTRALINE HCL 50 MG PO TABS: 50.0000 mg | ORAL_TABLET | Freq: Every day | ORAL | Status: DC

## 2022-03-15 MED ORDER — IOHEXOL 350 MG/ML SOLN
100.0000 mL | Freq: Once | INTRAVENOUS | Status: AC | PRN
  Administered 2022-03-15: 100 mL via INTRAVENOUS

## 2022-03-15 MED ORDER — ACETAMINOPHEN 325 MG PO TABS
650.0000 mg | ORAL_TABLET | Freq: Four times a day (QID) | ORAL | Status: DC | PRN
  Administered 2022-03-15 (×2): 650 mg via ORAL
  Filled 2022-03-15 (×2): qty 2

## 2022-03-15 MED ORDER — TAMSULOSIN HCL 0.4 MG PO CAPS: 0.4000 mg | ORAL_CAPSULE | Freq: Every day | ORAL | 3 refills | Status: DC

## 2022-03-15 MED ORDER — PHENAZOPYRIDINE HCL 100 MG PO TABS
100.0000 mg | ORAL_TABLET | Freq: Three times a day (TID) | ORAL | Status: DC
  Filled 2022-03-15 (×2): qty 1

## 2022-03-15 MED ORDER — OXYCODONE-ACETAMINOPHEN 7.5-325 MG PO TABS: 1.0000 | ORAL_TABLET | Freq: Three times a day (TID) | ORAL | Status: DC | PRN

## 2022-03-15 MED ORDER — APIXABAN 2.5 MG PO TABS: 2.5000 mg | ORAL_TABLET | Freq: Two times a day (BID) | ORAL | Status: DC

## 2022-03-15 MED ORDER — QUETIAPINE FUMARATE 25 MG PO TABS
50.0000 mg | ORAL_TABLET | Freq: Every day | ORAL | Status: DC
Start: 2022-03-15 — End: 2022-03-15

## 2022-03-15 MED ORDER — POLYETHYLENE GLYCOL 3350 17 G PO PACK: 17.0000 g | PACK | Freq: Every day | ORAL | Status: DC | PRN

## 2022-03-15 MED ORDER — CEFADROXIL 500 MG PO CAPS: 500.0000 mg | ORAL_CAPSULE | Freq: Two times a day (BID) | ORAL | 0 refills | Status: AC

## 2022-03-15 MED ORDER — SODIUM CHLORIDE 0.9 % IV SOLN: INTRAVENOUS | Status: AC

## 2022-03-15 MED ORDER — FINASTERIDE 5 MG PO TABS: 5.0000 mg | ORAL_TABLET | Freq: Every day | ORAL | Status: DC

## 2022-03-15 MED ORDER — ACETAMINOPHEN 650 MG RE SUPP: 650.0000 mg | Freq: Four times a day (QID) | RECTAL | Status: DC | PRN

## 2022-03-15 NOTE — ED Notes (Signed)
Patient provided with perineal care, full linen change, and brief change. Patient placed in fresh gown and with warm blankets. Repositioned and resting comfortably in bed

## 2022-03-15 NOTE — Progress Notes (Signed)
Visited with patient per son and  patient request.  I provided care for this patient on previous visits. Family requested my presence. Provided  listening, emotional and spiritual support.    Chaplain available as needed.  Jaclynn Major, [place], [facility], Pager [phone]

## 2022-03-15 NOTE — Discharge Summary (Signed)
Physician Discharge Summary   Patient: Brian [name] MRN: [id number] DOB: [birth year]  Admit date:     03/14/2022  Discharge date: 03/15/22  Discharge Physician: Berle Mull  PCP: Libby Maw, MD  Recommendations at discharge: Follow-up with PCP in 1 week.  Discharge Diagnoses: Principal Problem:   Syncope Active Problems:   Essential hypertension   History of peristent atrial fibrillation   Coronary artery disease involving native coronary artery of native heart without angina pectoris   Mixed hyperlipidemia   Dysuria   GERD (gastroesophageal reflux disease)   Pressure injury of skin  Assessment and Plan: Syncope Possible vasovagal event. Patient was sitting in his recliner and was found on responsive. EKG so far unremarkable.  Telemetry so far unremarkable for any significant arrhythmia. Patient has chronic A-fib and is rate controlled. Troponin x2's are negative. CTA chest negative for PE. Bilateral lower extremity has edema but no evidence of DVT exam on exam. EKG negative for any acute ischemia as well. Echocardiogram was performed which shows preserved EF and no significant new valvular abnormality. Does not appear to have any infection right now. CT head unremarkable. No focal deficit that is new on my examination. Patient was treated with IV fluids. Orthostatic to the best of his ability was negative as well. Blood pressure is somewhat on the softer side and therefore I will be discontinuing his losartan on discharge but continue metoprolol for A-fib. Recommend palliative care consultation on discharge due to his multiple comorbidities.   Dysuria Unable to perform a UA so far. We will initiate 3-day short course of cefadroxil.   Persistent A-fib Reportedly patient had bleeding 3 to 4 weeks ago. Hemoglobin has dropped from a baseline of 12 to 10. Currently does not have any acute bleeding while here in the hospital or at home for last 3 weeks. For  now given his risk factors I would recommend to continue Eliquis which is actually on a lower dose. Continue metoprolol for rate control.  BRBPR. Hemoccult was positive. No active bleeding seen here in the hospital. Hemoglobin remained stable. Outpatient follow-up with PCP.   CAD We will resume Eliquis.  Continue statin. Continue metoprolol for rate control.   BPH We will continue home regimen.   Hypertension Continue metoprolol.  Hold losartan.   Hyperlipidemia Continue statin.   History of stroke Continue statin and Eliquis.  Depression and anxiety We will continue home regimen.   GERD Continue PPI.  Left elbow pressure ulcer stage I present on admission. Bilateral buttocks stage I pressure ulcer present on admission. Does not appear to be infected. Continue wound care on discharge at home per current home regimen.  Patient is adamant to go home.  He actually threatened to leave AMA as well. Currently does not have any acute need for inpatient stay. Recommend close follow-up with PCP discharge.  Pain control - [employer] Controlled Substance Reporting System database was reviewed. and patient was instructed, not to drive, operate heavy machinery, perform activities at heights, swimming or participation in water activities or provide baby-sitting services while on Pain, Sleep and Anxiety Medications; until their outpatient Physician has advised to do so again. Also recommended to not to take more than prescribed Pain, Sleep and Anxiety Medications.   Consultants: none Procedures performed:  Echocardiogram  DISCHARGE MEDICATION: Allergies as of 03/15/2022       Reactions   Novocain [procaine Hcl]    Irregular heart beat    Other    Patient had problem with  epidural with his right hip surgery. Went to up extremity instead of lower extremity    Penicillins Itching   Has patient had a PCN reaction causing immediate rash, facial/tongue/throat swelling, SOB or  lightheadedness with hypotension: Unk Has patient had a PCN reaction causing severe rash involving mucus membranes or skin necrosis: Unk Has patient had a PCN reaction that required hospitalization: Unk Has patient had a PCN reaction occurring within the last 10 years: No If all of the above answers are "NO", then may proceed with Cephalosporin use. No reaction noted        Medication List     STOP taking these medications    losartan 25 MG tablet Commonly known as: COZAAR       TAKE these medications    acetaminophen 500 MG tablet Commonly known as: TYLENOL Take 1,000 mg by mouth every 6 (six) hours as needed for mild pain.   ALPRAZolam 0.25 MG tablet Commonly known as: XANAX Take 0.25 mg by mouth 2 (two) times daily as needed for anxiety.   apixaban 2.5 MG Tabs tablet Commonly known as: Eliquis TAKE 1 TABLET(2.5 MG) BY MOUTH TWICE DAILY What changed:  how much to take how to take this when to take this additional instructions   cefadroxil 500 MG capsule Commonly known as: DURICEF Take 1 capsule (500 mg total) by mouth 2 (two) times daily for 3 days.   cetirizine 10 MG tablet Commonly known as: ZYRTEC TAKE 1 TABLET(10 MG) BY MOUTH AT BEDTIME What changed:  how much to take how to take this when to take this reasons to take this additional instructions   finasteride 5 MG tablet Commonly known as: PROSCAR TAKE 1 TABLET(5 MG) BY MOUTH DAILY What changed: See the new instructions.   fluticasone 50 MCG/ACT nasal spray Commonly known as: FLONASE Place 1 spray into both nostrils daily as needed for allergies.   hydrocortisone ointment 0.5 % Apply 1 Application topically daily as needed for itching.   melatonin 5 MG Tabs Take 5 mg by mouth at bedtime as needed (sleep).   metoprolol succinate 25 MG 24 hr tablet Commonly known as: TOPROL-XL TAKE 1 TABLET(25 MG) BY MOUTH DAILY What changed: See the new instructions.   multivitamin tablet Take 1 tablet  by mouth 2 (two) times daily.   MUPIROCIN EX Apply 1 Application topically daily as needed (wound care).   oxyCODONE-acetaminophen 7.5-325 MG tablet Commonly known as: PERCOCET Take 1 tablet by mouth every 8 (eight) hours as needed for severe pain.   pantoprazole 40 MG tablet Commonly known as: PROTONIX TAKE 1 TABLET(40 MG) BY MOUTH DAILY What changed: See the new instructions.   polyethylene glycol powder 17 GM/SCOOP powder Commonly known as: GLYCOLAX/MIRALAX Mix one scoop with water and take daily until stools loosen. May repeat as needed. What changed:  how much to take how to take this when to take this reasons to take this additional instructions   pravastatin 40 MG tablet Commonly known as: PRAVACHOL TAKE 1 TABLET(40 MG) BY MOUTH DAILY What changed: See the new instructions.   QUEtiapine 50 MG tablet Commonly known as: SEROQUEL TAKE 1 TABLET(50 MG) BY MOUTH AT BEDTIME What changed: See the new instructions.   sertraline 50 MG tablet Commonly known as: ZOLOFT TAKE 1 TABLET(50 MG) BY MOUTH EVERY MORNING What changed:  how much to take how to take this when to take this additional instructions   tamsulosin 0.4 MG Caps capsule Commonly known as: FLOMAX Take 1 capsule (  0.4 mg total) by mouth daily after supper. What changed: See the new instructions.               Discharge Care Instructions  (From admission, onward)           Start     Ordered   03/15/22 0000  Discharge wound care:       Comments: Continue home dressing changes.   03/15/22 1624            Disposition: Home Diet recommendation: Cardiac diet  Discharge Exam: Vitals:   03/15/22 0316 03/15/22 0324 03/15/22 0801 03/15/22 1200  BP: 125/64  (!) 100/57 (!) 114/45  Pulse: (!) 103 87 98 71  Resp: '18  14 16  '$ Temp: 99 F (37.2 C)  98.5 F (36.9 C) 98.5 F (36.9 C)  TempSrc:   Oral Oral  SpO2: 100%  97% 99%  Weight:      Height:       General: Appear in mild distress;  no visible Abnormal Neck Mass Or lumps, Conjunctiva normal Cardiovascular: S1 and S2 Present, no Murmur, Respiratory: good respiratory effort, Bilateral Air entry present and CTA, no Crackles, no wheezes Abdomen: Bowel Sound present, Non tender  Extremities: bilateral Pedal edema Neurology: alert and oriented to time, place, and person  Gait not checked due to patient safety concerns Filed Weights   03/14/22 2018  Weight: 83.9 kg   Condition at discharge: stable  The results of significant diagnostics from this hospitalization (including imaging, microbiology, ancillary and laboratory) are listed below for reference.   Imaging Studies: ECHOCARDIOGRAM COMPLETE  Result Date: 03/15/2022    ECHOCARDIOGRAM REPORT   Patient Name:   Brian [name] Date of Exam: 03/15/2022 Medical Rec #:  [id number]      Height:       68.0 in Accession #:    [id number]     Weight:       185.0 lb Date of Birth:  04-20-1933      BSA:          1.977 m Patient Age:    38 years       BP:           125/64 mmHg Patient Gender: M              HR:           98 bpm. Exam Location:  Inpatient Procedure: 2D Echo, Cardiac Doppler and Color Doppler                               MODIFIED REPORT: This report was modified by Cherlynn Kaiser MD on 03/15/2022 due to revision.  Indications:     Syncope  History:         Patient has no prior history of Echocardiogram examinations.                  CAD, Arrythmias:Atrial Fibrillation; Risk Factors:Hypertension.                   Mitral Valve: unknown size prosthetic annuloplasty ring valve                  is present in the mitral position. Procedure Date: 2002 [facility].  Sonographer:     Jyl Heinz Referring Phys:  [id number] [place] Diagnosing Phys: Cherlynn Kaiser MD IMPRESSIONS  1. Left ventricular ejection fraction, by estimation, is 55 to  60%. The left ventricle has normal function. The left ventricle has no regional wall motion abnormalities. There is mild left ventricular hypertrophy.  Left ventricular diastolic parameters are indeterminate.  2. Right ventricular systolic function is normal. The right ventricular size is mildly enlarged. There is moderately elevated pulmonary artery systolic pressure. The estimated right ventricular systolic pressure is 93.2 mmHg.  3. Left atrial size was severely dilated.  4. Right atrial size was severely dilated.  5. The mitral valve has been repaired/replaced. Mild mitral valve regurgitation. The mean mitral valve gradient is 3.6 mmHg with average heart rate of 70 bpm. There is a unknown size prosthetic annuloplasty ring present in the mitral position. Procedure  Date: 2002 [facility] Echo findings are consistent with normal structure and function of the mitral valve prosthesis.  6. Tricuspid valve regurgitation is severe.  7. The aortic valve is abnormal. There is mild calcification of the aortic valve. Aortic valve regurgitation is moderate. No aortic stenosis is present.  8. The inferior vena cava is dilated in size with <50% respiratory variability, suggesting right atrial pressure of 15 mmHg. FINDINGS  Left Ventricle: Left ventricular ejection fraction, by estimation, is 55 to 60%. The left ventricle has normal function. The left ventricle has no regional wall motion abnormalities. The left ventricular internal cavity size was normal in size. There is  mild left ventricular hypertrophy. Abnormal (paradoxical) septal motion consistent with post-operative status. Left ventricular diastolic parameters are indeterminate. Right Ventricle: The right ventricular size is mildly enlarged. No increase in right ventricular wall thickness. Right ventricular systolic function is normal. There is moderately elevated pulmonary artery systolic pressure. The tricuspid regurgitant velocity is 2.89 m/s, and with an assumed right atrial pressure of 15 mmHg, the estimated right ventricular systolic pressure is 67.1 mmHg. Left Atrium: Left atrial size was severely dilated. Right  Atrium: Right atrial size was severely dilated. Pericardium: There is no evidence of pericardial effusion. Mitral Valve: The mitral valve has been repaired/replaced. Mild mitral valve regurgitation. There is a unknown size prosthetic annuloplasty ring present in the mitral position. Procedure Date: 2002 [facility] [place]. Echo findings are consistent with normal structure  and function of the mitral valve prosthesis. The mean mitral valve gradient is 3.6 mmHg with average heart rate of 70 bpm. Tricuspid Valve: The tricuspid valve is normal in structure. Tricuspid valve regurgitation is severe. No evidence of tricuspid stenosis. Aortic Valve: The aortic valve is abnormal. There is mild calcification of the aortic valve. Aortic valve regurgitation is moderate. Aortic regurgitation PHT measures 558 msec. No aortic stenosis is present. Aortic valve peak gradient measures 9.5 mmHg. Pulmonic Valve: The pulmonic valve was normal in structure. Pulmonic valve regurgitation is mild. No evidence of pulmonic stenosis. Aorta: The aortic root is normal in size and structure. Ascending aorta measurements are within normal limits for age when indexed to body surface area. Venous: The inferior vena cava is dilated in size with less than 50% respiratory variability, suggesting right atrial pressure of 15 mmHg. IAS/Shunts: No atrial level shunt detected by color flow Doppler.  LEFT VENTRICLE PLAX 2D LVIDd:         3.90 cm      Diastology LVIDs:         3.00 cm      LV e' medial:    6.31 cm/s LV PW:         1.10 cm      LV E/e' medial:  25.0 LV IVS:        1.10  cm      LV e' lateral:   7.51 cm/s LVOT diam:     2.20 cm      LV E/e' lateral: 21.0 LV SV:         72 LV SV Index:   36 LVOT Area:     3.80 cm  LV Volumes (MOD) LV vol d, MOD A2C: 107.0 ml LV vol d, MOD A4C: 120.0 ml LV vol s, MOD A2C: 45.4 ml LV vol s, MOD A4C: 49.9 ml LV SV MOD A2C:     61.6 ml LV SV MOD A4C:     120.0 ml LV SV MOD BP:      68.1 ml RIGHT VENTRICLE            IVC RV Basal  diam:  4.30 cm    IVC diam: 2.20 cm RV S prime:     9.25 cm/s TAPSE (M-mode): 1.4 cm LEFT ATRIUM             Index        RIGHT ATRIUM           Index LA diam:        4.50 cm 2.28 cm/m   RA Area:     18.70 cm LA Vol (A2C):   89.3 ml 45.17 ml/m  RA Volume:   48.60 ml  24.58 ml/m LA Vol (A4C):   72.5 ml 36.67 ml/m LA Biplane Vol: 85.0 ml 42.99 ml/m  AORTIC VALVE AV Area (Vmax): 2.57 cm AV Vmax:        154.00 cm/s AV Peak Grad:   9.5 mmHg LVOT Vmax:      104.00 cm/s LVOT Vmean:     72.400 cm/s LVOT VTI:       0.189 m AI PHT:         558 msec  AORTA Ao Root diam: 3.70 cm Ao Asc diam:  3.40 cm MITRAL VALVE                TRICUSPID VALVE MV Area (PHT): 5.09 cm     TR Peak grad:   33.4 mmHg MV Mean grad:  3.6 mmHg     TR Vmax:        289.00 cm/s MV Decel Time: 149 msec MV E velocity: 158.00 cm/s  SHUNTS                             Systemic VTI:  0.19 m                             Systemic Diam: 2.20 cm Cherlynn Kaiser MD Electronically signed by Cherlynn Kaiser MD Signature Date/Time: 03/15/2022/4:12:04 PM    Final (Updated)    CT ABDOMEN PELVIS W CONTRAST  Result Date: 03/15/2022 CLINICAL DATA:  Syncope, hypotension, abdominal pain. EXAM: CT ANGIOGRAPHY CHEST CT ABDOMEN AND PELVIS WITH CONTRAST TECHNIQUE: Multidetector CT imaging of the chest was performed using the standard protocol during bolus administration of intravenous contrast. Multiplanar CT image reconstructions and MIPs were obtained to evaluate the vascular anatomy. Multidetector CT imaging of the abdomen and pelvis was performed using the standard protocol during bolus administration of intravenous contrast. RADIATION DOSE REDUCTION: This exam was performed according to the departmental dose-optimization program which includes automated exposure control, adjustment of the mA and/or kV according to patient size and/or use of iterative reconstruction technique. CONTRAST:  123m OMNIPAQUE  IOHEXOL 350 MG/ML SOLN COMPARISON:  Chest radiograph dated  03/14/2022 FINDINGS: CTA CHEST FINDINGS Cardiovascular: Satisfactory opacification of the bilateral pulmonary arteries to the segmental level. No evidence of pulmonary embolism. Study is not tailored for evaluation of the thoracic aorta. No evidence of thoracic aortic aneurysm. Atherosclerotic calcifications of the arch. Cardiomegaly.  No pericardial effusion.  Prosthetic mitral valve. Coronary atherosclerosis of the LAD. Mediastinum/Nodes: No suspicious mediastinal lymphadenopathy. Visualized thyroid is unremarkable. Lungs/Pleura: Eventration of left hemidiaphragm with associated lingular and left lower lobe atelectasis. Mild atelectasis in the posterior upper lobes and medial right lower lobe. No focal consolidation. Mild centrilobular and paraseptal emphysematous changes, upper lung predominant. Evaluation lung parenchyma is constrained by respiratory motion. Within that constraint, there are no suspicious pulmonary nodules. No pleural effusion or pneumothorax. Musculoskeletal: Mild degenerative changes of the lower thoracic spine. Vertebral hemangioma at T3. Median sternotomy. Review of the MIP images confirms the above findings. CT ABDOMEN and PELVIS FINDINGS Hepatobiliary: Scattered hepatic cysts measuring up to 19 mm in segment 4. Gallbladder is unremarkable. No intrahepatic or extrahepatic ductal dilatation. Pancreas: Mild parenchymal atrophy. Spleen: Within normal limits. Adrenals/Urinary Tract: Adrenal glands are within normal limits. Bilateral renal cysts, measuring up to 3.2 cm in the right upper kidney (series 5/image 23), benign (Bosniak II). No hydronephrosis. Bladder is within normal limits, noting streak artifact. Stomach/Bowel: Stomach is within normal limits. No evidence of bowel obstruction. Appendix is not discretely visualized. Scattered colonic diverticulosis, without evidence of diverticulitis. Vascular/Lymphatic: No evidence of abdominal aortic aneurysm. Atherosclerotic calcifications of the  abdominal aorta and branch vessels. No suspicious abdominopelvic lymphadenopathy. Reproductive: Prostatomegaly, suggesting BPH. Other: No abdominopelvic ascites. Mild presacral fluid/stranding (series 3/image 63). Musculoskeletal: Right hip arthroplasty, without evidence of complication. Degenerative changes of the visualized thoracolumbar spine. Median sternotomy. Review of the MIP images confirms the above findings. IMPRESSION: No evidence of pulmonary embolism. No evidence of acute cardiopulmonary disease. No acute findings in the abdomen/pelvis. Additional ancillary findings as above. Electronically Signed   By: Julian Hy M.D.   On: 03/15/2022 00:39   CT Angio Chest PE W and/or Wo Contrast  Result Date: 03/15/2022 CLINICAL DATA:  Syncope, hypotension, abdominal pain. EXAM: CT ANGIOGRAPHY CHEST CT ABDOMEN AND PELVIS WITH CONTRAST TECHNIQUE: Multidetector CT imaging of the chest was performed using the standard protocol during bolus administration of intravenous contrast. Multiplanar CT image reconstructions and MIPs were obtained to evaluate the vascular anatomy. Multidetector CT imaging of the abdomen and pelvis was performed using the standard protocol during bolus administration of intravenous contrast. RADIATION DOSE REDUCTION: This exam was performed according to the departmental dose-optimization program which includes automated exposure control, adjustment of the mA and/or kV according to patient size and/or use of iterative reconstruction technique. CONTRAST:  16m OMNIPAQUE IOHEXOL 350 MG/ML SOLN COMPARISON:  Chest radiograph dated 03/14/2022 FINDINGS: CTA CHEST FINDINGS Cardiovascular: Satisfactory opacification of the bilateral pulmonary arteries to the segmental level. No evidence of pulmonary embolism. Study is not tailored for evaluation of the thoracic aorta. No evidence of thoracic aortic aneurysm. Atherosclerotic calcifications of the arch. Cardiomegaly.  No pericardial effusion.   Prosthetic mitral valve. Coronary atherosclerosis of the LAD. Mediastinum/Nodes: No suspicious mediastinal lymphadenopathy. Visualized thyroid is unremarkable. Lungs/Pleura: Eventration of left hemidiaphragm with associated lingular and left lower lobe atelectasis. Mild atelectasis in the posterior upper lobes and medial right lower lobe. No focal consolidation. Mild centrilobular and paraseptal emphysematous changes, upper lung predominant. Evaluation lung parenchyma is constrained by respiratory motion. Within that constraint, there are no suspicious  pulmonary nodules. No pleural effusion or pneumothorax. Musculoskeletal: Mild degenerative changes of the lower thoracic spine. Vertebral hemangioma at T3. Median sternotomy. Review of the MIP images confirms the above findings. CT ABDOMEN and PELVIS FINDINGS Hepatobiliary: Scattered hepatic cysts measuring up to 19 mm in segment 4. Gallbladder is unremarkable. No intrahepatic or extrahepatic ductal dilatation. Pancreas: Mild parenchymal atrophy. Spleen: Within normal limits. Adrenals/Urinary Tract: Adrenal glands are within normal limits. Bilateral renal cysts, measuring up to 3.2 cm in the right upper kidney (series 5/image 23), benign (Bosniak II). No hydronephrosis. Bladder is within normal limits, noting streak artifact. Stomach/Bowel: Stomach is within normal limits. No evidence of bowel obstruction. Appendix is not discretely visualized. Scattered colonic diverticulosis, without evidence of diverticulitis. Vascular/Lymphatic: No evidence of abdominal aortic aneurysm. Atherosclerotic calcifications of the abdominal aorta and branch vessels. No suspicious abdominopelvic lymphadenopathy. Reproductive: Prostatomegaly, suggesting BPH. Other: No abdominopelvic ascites. Mild presacral fluid/stranding (series 3/image 63). Musculoskeletal: Right hip arthroplasty, without evidence of complication. Degenerative changes of the visualized thoracolumbar spine. Median  sternotomy. Review of the MIP images confirms the above findings. IMPRESSION: No evidence of pulmonary embolism. No evidence of acute cardiopulmonary disease. No acute findings in the abdomen/pelvis. Additional ancillary findings as above. Electronically Signed   By: Julian Hy M.D.   On: 03/15/2022 00:39   CT Head Wo Contrast  Result Date: 03/15/2022 CLINICAL DATA:  Mental status change EXAM: CT HEAD WITHOUT CONTRAST TECHNIQUE: Contiguous axial images were obtained from the base of the skull through the vertex without intravenous contrast. RADIATION DOSE REDUCTION: This exam was performed according to the departmental dose-optimization program which includes automated exposure control, adjustment of the mA and/or kV according to patient size and/or use of iterative reconstruction technique. COMPARISON:  CT brain 10/31/2017 report, 05/10/2016 FINDINGS: Brain: No acute territorial infarction, hemorrhage or intracranial mass. Chronic right MCA infarct with encephalomalacia and ex vacuo dilatation of the right ventricle. Small chronic appearing infarct within the left posterior parietal white matter. Atrophy. Mild chronic small vessel ischemic changes of the white matter. Vascular: No hyperdense vessels.  No unexpected calcification Skull: Normal. Negative for fracture or focal lesion. Sinuses/Orbits: No acute finding. Other: None. IMPRESSION: 1. No CT evidence for acute intracranial abnormality. 2. Chronic right MCA infarct. Atrophy and mild chronic small vessel ischemic changes of the white matter. Electronically Signed   By: Donavan Foil M.D.   On: 03/15/2022 00:14   DG Elbow Complete Left  Result Date: 03/14/2022 CLINICAL DATA:  Left elbow pain after syncope with fall. Wound on the posterior elbow. EXAM: LEFT ELBOW - COMPLETE 3+ VIEW COMPARISON:  None Available. FINDINGS: Diffuse bone demineralization. No evidence of acute fracture or dislocation of the left elbow. No focal bone lesion or bone  destruction. No significant effusion. Soft tissue swelling over the posterior left elbow consistent with history of wound, likely contusion. IMPRESSION: Posterior soft tissue swelling.  No acute fracture or dislocation. Electronically Signed   By: Lucienne Capers M.D.   On: 03/14/2022 19:05   DG Chest Portable 1 View  Result Date: 03/14/2022 CLINICAL DATA:  Syncope.  Found on the floor.  Pale. EXAM: PORTABLE CHEST 1 VIEW COMPARISON:  11/03/2018 FINDINGS: Postoperative changes in the mediastinum. Cardiac valve prosthesis. Cardiac enlargement. No vascular congestion. Small left pleural effusion with basilar infiltration or atelectasis. No pneumothorax. Degenerative changes in the spine and shoulders. IMPRESSION: Cardiac enlargement. Small left pleural effusion with basilar atelectasis or infiltration. Electronically Signed   By: Lucienne Capers M.D.   On: 03/14/2022  19:04    Microbiology: Results for orders placed or performed in visit on 12/07/21  Urine Culture     Status: None   Collection Time: 12/07/21 10:16 AM   Specimen: Urine  Result Value Ref Range Status   MICRO NUMBER: [id number]  Final   SPECIMEN QUALITY: Adequate  Final   Sample Source NOT GIVEN  Final   STATUS: FINAL  Final   Result:   Final    Mixed genital flora isolated. These superficial bacteria are not indicative of a urinary tract infection. No further organism identification is warranted on this specimen. If clinically indicated, recollect clean-catch, mid-stream urine and transfer  immediately to Urine Culture Transport Tube.     Labs: CBC: Recent Labs  Lab 03/14/22 1904 03/15/22 0348  WBC 11.1* 8.6  NEUTROABS 8.9*  --   HGB 10.5* 10.1*  HCT 33.9* 32.5*  MCV 95.8 94.5  PLT 237 449   Basic Metabolic Panel: Recent Labs  Lab 03/14/22 1904  NA 137  K 3.7  CL 104  CO2 26  GLUCOSE 115*  BUN 18  CREATININE 0.98  CALCIUM 8.8*   Liver Function Tests: Recent Labs  Lab 03/14/22 1904  AST 25  ALT 16   ALKPHOS 68  BILITOT 0.8  PROT 6.3*  ALBUMIN 3.2*   CBG: No results for input(s): "GLUCAP" in the last 168 hours.  Discharge time spent: greater than 30 minutes.  Signed: Berle Mull, MD [facility] 03/15/2022

## 2022-03-15 NOTE — Progress Notes (Signed)
Brian [name], KOLODZIEJSKI ([id number]) Visit Report for 03/14/2022 Chief Complaint Document Details Patient Name: Date of Service: Brian [name] ID M. 03/14/2022 2:30 PM Medical Record Number: [id number] Patient Account Number: [id number] Date of Birth/Sex: Treating RN: November 26, 1932 (88 y.o. [name] Primary Care Provider: Abelino Derrick Other Clinician: Referring Provider: Treating Provider/Extender: Sandi Raveling in Treatment: 4 Information Obtained from: Patient Chief Complaint 08/05/2019; patient is here for review of pressure ulcers on his buttock 02/14/22: patient is here for review of pressure ulcers on his left elbow Electronic Signature(s) Signed: 03/14/2022 3:15:05 PM By: Fredirick Maudlin MD FACS Entered By: Fredirick Maudlin on 03/14/2022 15:15:05 -------------------------------------------------------------------------------- Debridement Details Patient Name: Date of Service: Brian [name] ID M. 03/14/2022 2:30 PM Medical Record Number: [id number] Patient Account Number: [id number] Date of Birth/Sex: Treating RN: 1933-01-16 (89 y.o. [name] Primary Care Provider: Abelino Derrick Other Clinician: Referring Provider: Treating Provider/Extender: Sandi Raveling in Treatment: 4 Debridement Performed for Assessment: Wound #1 Left Elbow Performed By: Physician Fredirick Maudlin, MD Debridement Type: Debridement Level of Consciousness (Pre-procedure): Awake and Alert Pre-procedure Verification/Time Out Yes - 15:04 Taken: Start Time: 15:04 Pain Control: Lidocaine 4% T opical Solution T Area Debrided (L x W): otal 3.3 (cm) x 3 (cm) = 9.9 (cm) Tissue and other material debrided: Non-Viable, Slough, Subcutaneous, Slough Level: Skin/Subcutaneous Tissue Debridement Description: Excisional Instrument: Curette Bleeding: Minimum Hemostasis Achieved: Pressure Procedural Pain: 0 Post Procedural Pain: 0 Response to  Treatment: Procedure was tolerated well Level of Consciousness (Post- Awake and Alert procedure): Post Debridement Measurements of Total Wound Length: (cm) 3.3 Stage: Category/Stage III Width: (cm) 3 Depth: (cm) 0.1 Volume: (cm) 0.778 Character of Wound/Ulcer Post Debridement: Improved Post Procedure Diagnosis Same as Pre-procedure Electronic Signature(s) Signed: 03/14/2022 3:38:24 PM By: Dellie Catholic RN Signed: 03/14/2022 4:17:27 PM By: Blanche East RN Signed: 03/15/2022 7:34:21 AM By: Fredirick Maudlin MD FACS Entered By: Blanche East on 03/14/2022 15:08:39 -------------------------------------------------------------------------------- HPI Details Patient Name: Date of Service: Brian [name] ID M. 03/14/2022 2:30 PM Medical Record Number: [id number] Patient Account Number: [id number] Date of Birth/Sex: Treating RN: [birth year] (88 y.o. [name] Primary Care Provider: Abelino Derrick Other Clinician: Referring Provider: Treating Provider/Extender: Sandi Raveling in Treatment: 4 History of Present Illness HPI Description: ADMISSION 08/05/19 This is an 88 year old man who arrives in clinic accompanied by his son. 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 Apparently noted recently to have a stage I pressure area on the left buttock. He does not have a wound history. He does have been air fluidized mattress. They have a wheelchair cushion as well as a pillow over the top of this. He is not incontinent of urine or bowel. Past medical history includes hypertension, osteoarthritis of the knee, atrial fibrillation, history of mitral valve repair, right hemisphere CVA with left hemiparesis, history of a DVT READMISSION 02/14/2022 The patient is here today for evaluation of pressure ulcers on his left elbow and possible right buttock ulcer. Apparently when he was seen by Dr. Dellia Nims in 2020, no ulcer was appreciated  on the buttock and no further wound care involvement occurred. 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 He does have home health aides and they are turning him every 2 hours side to side. He  is on a memory foam mattress at this time. 03/14/2022: The patient was scheduled to see me on July 11, but apparently his son panicked about some bright red blood per rectum and rushed him to the emergency room. There is also a comment in the electronic medical record that the patient's son thought the bone was exposed on his elbow. After waiting a prolonged period of time at the emergency department, they left without being seen. Today, it appears that the dressing on the patient's elbow was never changed since our last visit. The 2 wounds have converged creating a larger wound. According to the intake nurse, there was a foul odor from the wound and dressing. There is extensive slough on the wound surface. The bleeding per rectum turns out to be hemorrhoids. The patient's son expresses that he does not know how to manage this. Electronic Signature(s) Signed: 03/14/2022 3:17:18 PM By: Fredirick Maudlin MD FACS Entered By: Fredirick Maudlin on 03/14/2022 15:17:18 -------------------------------------------------------------------------------- Physical Exam Details Patient Name: Date of Service: Brian [name] ID M. 03/14/2022 2:30 PM Medical Record Number: [id number] Patient Account Number: [id number] Date of Birth/Sex: Treating RN: 1932/11/04 (88 y.o. [name] Primary Care Provider: Abelino Derrick Other Clinician: Referring Provider: Treating Provider/Extender: Sandi Raveling in Treatment: 4 Constitutional . Slightly tachycardic, asymptomatic. . . No acute distress.Marland Kitchen Respiratory Normal work of breathing on room air.. Notes 03/14/2022: Today, it  appears that the dressing on the patient's elbow was never changed since our last visit. The 2 wounds have converged creating a larger wound. According to the intake nurse, there was a foul odor from the wound and dressing. There is extensive slough on the wound surface. Electronic Signature(s) Signed: 03/14/2022 3:21:49 PM By: Fredirick Maudlin MD FACS Entered By: Fredirick Maudlin on 03/14/2022 15:21:49 -------------------------------------------------------------------------------- Physician Orders Details Patient Name: Date of Service: Brian [name] ID M. 03/14/2022 2:30 PM Medical Record Number: [id number] Patient Account Number: [id number] Date of Birth/Sex: Treating RN: [birth year] (89 y.o. [name] Primary Care Provider: Abelino Derrick Other Clinician: Referring Provider: Treating Provider/Extender: Sandi Raveling in Treatment: 4 Verbal / Phone Orders: No Diagnosis Coding ICD-10 Coding Code Description [phone] Pressure ulcer of left elbow, stage 3 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 I48.19 Other persistent atrial fibrillation I25.10 Atherosclerotic heart disease of native coronary artery without angina pectoris Follow-up Appointments ppointment in 1 week. - Dr. Celine Ahr - Room 1 - 8/8 at 2:00 PM Return A Bathing/ Shower/ Hygiene May shower and wash wound with soap and water. - with dressing changes Off-Loading Turn and reposition every 2 hours - stay off of left elbow [place] wound care orders this week; continue Home Health for wound care. May utilize formulary equivalent dressing for wound treatment orders unless otherwise specified. Other Home Health Orders/Instructions: - [facility] Wound Treatment Wound #1 - Elbow Wound Laterality: Left Cleanser: Soap and Water Every Other Day/10 Days Discharge Instructions: May shower and wash wound with dial antibacterial soap and water prior to  dressing change. Cleanser: Wound Cleanser Every Other Day/10 Days Discharge Instructions: Cleanse the wound with wound cleanser prior to applying a clean dressing using gauze sponges, not tissue or cotton balls. Prim Dressing: KerraCel Ag Gelling Fiber Dressing, 4x5 in (silver alginate) (DME) (Generic) Every Other Day/10 Days ary Discharge Instructions: Apply silver alginate to wound bed as instructed Prim Dressing: Santyl Ointment Every Other Day/10 Days ary Discharge Instructions: Apply nickel thick amount  to wound bed as instructed Secondary Dressing: ALLEVYN Gentle Border, 5x5 (in/in) (DME) (Generic) Every Other Day/10 Days Discharge Instructions: Apply over primary dressing as directed. Secondary Dressing: ALLEVYN Heel 4 1/2in x 5 1/2in / 10.5cm x 13.5cm (DME) (Generic) Every Other Day/10 Days Discharge Instructions: Apply over primary dressing as directed. Secured With: [employer], 4.5x3.1 (in/yd) (DME) (Generic) Every Other Day/10 Days Discharge Instructions: Secure with Kerlix as directed. Secured With: 19M Medipore H Soft Cloth Surgical T ape, 4 x 10 (in/yd) (DME) (Generic) Every Other Day/10 Days Discharge Instructions: Secure with tape as directed. Patient Medications llergies: penicillin, Novocain A Notifications Medication Indication Start End 03/14/2022 Santyl DOSE topical 250 unit/gram ointment - apply nickel thick layer to wound with each dressing change. Electronic Signature(s) Signed: 03/14/2022 4:29:40 PM By: Adline Peals Signed: 03/15/2022 7:34:21 AM By: Fredirick Maudlin MD FACS Previous Signature: 03/14/2022 3:23:35 PM Version By: Fredirick Maudlin MD FACS Entered By: Adline Peals on 03/14/2022 15:37:30 -------------------------------------------------------------------------------- Problem List Details Patient Name: Date of Service: Brian [name] ID M. 03/14/2022 2:30 PM Medical Record Number: [id number] Patient Account Number: [id number] Date of  Birth/Sex: Treating RN: 1932-11-14 (89 y.o. [name] Primary Care Provider: Abelino Derrick Other Clinician: Referring Provider: Treating Provider/Extender: Sandi Raveling in Treatment: 4 Active Problems ICD-10 Encounter Code Description Active Date MDM Diagnosis L89.023 Pressure ulcer of left elbow, stage 3 02/14/2022 No Yes 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 02/14/2022 No Yes I48.19 Other persistent atrial fibrillation 02/14/2022 No Yes I25.10 Atherosclerotic heart disease of native coronary artery without angina pectoris 02/14/2022 No Yes Inactive Problems Resolved Problems Electronic Signature(s) Signed: 03/14/2022 3:13:03 PM By: Fredirick Maudlin MD FACS Entered By: Fredirick Maudlin on 03/14/2022 15:13:03 -------------------------------------------------------------------------------- Progress Note Details Patient Name: Date of Service: Brian [name] ID M. 03/14/2022 2:30 PM Medical Record Number: [id number] Patient Account Number: [id number] Date of Birth/Sex: Treating RN: 07-18-1933 (89 y.o. [name] Primary Care Provider: Abelino Derrick Other Clinician: Referring Provider: Treating Provider/Extender: Sandi Raveling in Treatment: 4 Subjective Chief Complaint Information obtained from Patient 08/05/2019; patient is here for review of pressure ulcers on his buttock 02/14/22: patient is here for review of pressure ulcers on his left elbow History of Present Illness (HPI) ADMISSION 08/05/19 This is an 88 year old man who arrives in clinic accompanied by his son. 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 Apparently noted recently to have a stage I pressure area on the left buttock. He does not have a wound history. He does have been air fluidized mattress. They have a wheelchair cushion as well as a pillow over the top of this. He  is not incontinent of urine or bowel. Past medical history includes hypertension, osteoarthritis of the knee, atrial fibrillation, history of mitral valve repair, right hemisphere CVA with left hemiparesis, history of a DVT READMISSION 02/14/2022 The patient is here today for evaluation of pressure ulcers on his left elbow and possible right buttock ulcer. Apparently when he was seen by Dr. Dellia Nims in 2020, no ulcer was appreciated on the buttock and no further wound care involvement occurred. 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 He does have home health aides and they are turning him every 2 hours side to side. He is on a memory foam  mattress at this time. 03/14/2022: The patient was scheduled to see me on July 11, but apparently his son panicked about some bright red blood per rectum and rushed him to the emergency room. There is also a comment in the electronic medical record that the patient's son thought the bone was exposed on his elbow. After waiting a prolonged period of time at the emergency department, they left without being seen. Today, it appears that the dressing on the patient's elbow was never changed since our last visit. The 2 wounds have converged creating a larger wound. According to the intake nurse, there was a foul odor from the wound and dressing. There is extensive slough on the wound surface. The bleeding per rectum turns out to be hemorrhoids. The patient's son expresses that he does not know how to manage this. Patient History Information obtained from Patient. Family History Diabetes - Maternal Grandparents, Heart Disease - Mother, Hypertension - Mother, No family history of Cancer, Hereditary Spherocytosis, Kidney Disease, Lung Disease, Seizures, Stroke, Thyroid Problems, Tuberculosis. Social History Never smoker, Marital Status - Married,  Alcohol Use - Never, Drug Use - No History, Caffeine Use - Never. Medical History Eyes Patient has history of Cataracts - removed Cardiovascular Patient has history of Arrhythmia - Atrial Flutter/Atrial Fib, Coronary Artery Disease, Hypotension Denies history of Hypertension Musculoskeletal Patient has history of Osteoarthritis Neurologic Denies history of Paraplegia Hospitalization/Surgery History - open heart surgery. - IR generic histoircal. - appendectomy. - hernia repair. - joint replacement. - mitral valve repair. - mvp repair. - total hip arthroplasty. Medical A Surgical History Notes nd Constitutional Symptoms (General Health) obseity Ear/Nose/Mouth/Throat wears hearing aids Cardiovascular Mitral Valve Repair, Genitourinary BPH Neurologic CVA 2018, left side hemiparesis Objective Constitutional Slightly tachycardic, asymptomatic. No acute distress.. Vitals Time Taken: 2:44 PM, Height: 66 in, Weight: 185 lbs, BMI: 29.9, Temperature: 98.1 F, Pulse: 102 bpm, Respiratory Rate: 20 breaths/min, Blood Pressure: 112/47 mmHg. Respiratory Normal work of breathing on room air.. General Notes: 03/14/2022: T oday, it appears that the dressing on the patient's elbow was never changed since our last visit. The 2 wounds have converged creating a larger wound. According to the intake nurse, there was a foul odor from the wound and dressing. There is extensive slough on the wound surface. Integumentary (Hair, Skin) Wound #1 status is Open. Original cause of wound was Pressure Injury. The date acquired was: 12/19/2021. The wound has been in treatment 4 weeks. The wound is located on the Left Elbow. The wound measures 3.3cm length x 3cm width x 0.1cm depth; 7.775cm^2 area and 0.778cm^3 volume. There is Fat Layer (Subcutaneous Tissue) exposed. There is no tunneling or undermining noted. There is a medium amount of serosanguineous drainage noted. The wound margin is distinct with the outline  attached to the wound base. There is small (1-33%) red granulation within the wound bed. There is a large (67-100%) amount of necrotic tissue within the wound bed including Adherent Slough. 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 There was a Excisional Skin/Subcutaneous Tissue Debridement with a total area of 9.9 sq cm performed by Fredirick Maudlin, MD. With the following instrument(s): Curette to remove Non-Viable tissue/material. Material removed includes Subcutaneous Tissue and Slough and after achieving pain control using  Lidocaine 4% T opical Solution. No specimens were taken. A time out was conducted at 15:04, prior to the start of the procedure. A Minimum amount of bleeding was controlled with Pressure. The procedure was tolerated well with a pain level of 0 throughout and a pain level of 0 following the procedure. Post Debridement Measurements: 3.3cm length x 3cm width x 0.1cm depth; 0.778cm^3 volume. Post debridement Stage noted as Category/Stage III. Character of Wound/Ulcer Post Debridement is improved. Post procedure Diagnosis Wound #1: Same as Pre-Procedure Plan Follow-up Appointments: Return Appointment in 1 week. - Dr. Celine Ahr - Room 1 - 8/8 at 2:00 PM Bathing/ Shower/ Hygiene: May shower and wash wound with soap and water. - with dressing changes Off-Loading: Turn and reposition every 2 hours - stay off of left elbow Home Health: Admit to [facility] for wound care. May utilize formulary equivalent dressing for wound treatment orders unless otherwise specified. Other Home Health Orders/Instructions: - [facility] The following medication(s) was prescribed:  Santyl topical 250 unit/gram ointment apply nickel thick layer to wound with each dressing change. starting 03/14/2022 WOUND #1: - Elbow Wound Laterality: Left Cleanser: Soap and Water Every Other Day/10 Days Discharge Instructions: May shower and wash wound with dial antibacterial soap and water prior to dressing change. Cleanser: Wound Cleanser Every Other Day/10 Days Discharge Instructions: Cleanse the wound with wound cleanser prior to applying a clean dressing using gauze sponges, not tissue or cotton balls. Prim Dressing: KerraCel Ag Gelling Fiber Dressing, 4x5 in (silver alginate) (DME) (Generic) Every Other Day/10 Days ary Discharge Instructions: Apply silver alginate to wound bed as instructed Prim Dressing: Santyl Ointment Every Other Day/10 Days ary Discharge Instructions: Apply nickel thick amount to wound bed as instructed Secondary Dressing: ALLEVYN Gentle Border, 5x5 (in/in) (DME) (Generic) Every Other Day/10 Days Discharge Instructions: Apply over primary dressing as directed. Secondary Dressing: ALLEVYN Heel 4 1/2in x 5 1/2in / 10.5cm x 13.5cm (DME) (Generic) Every Other Day/10 Days Discharge Instructions: Apply over primary dressing as directed. Secured With: [employer], 4.5x3.1 (in/yd) (DME) (Generic) Every Other Day/10 Days Discharge Instructions: Secure with Kerlix as directed. Secured With: 70M Medipore H Soft Cloth Surgical T ape, 4 x 10 (in/yd) (DME) (Generic) Every Other Day/10 Days Discharge Instructions: Secure with tape as directed. 03/14/2022: Today, it appears that the dressing on the patient's elbow was never changed since our last visit. The 2 wounds have converged creating a larger wound. According to the intake nurse, there was a foul odor from the wound and dressing. There is extensive slough on the wound surface. I used forceps and a scalpel to debride slough and nonviable subcutaneous tissue from the wound. I think the patient's bursa may be exposed,  so I did not debride further. He needs additional enzymatic debridement and I have prescribed Santyl for this. It was iterated to the patient's son multiple times that the wound dressing needed to be changed every other day and that it was unacceptable for it to not be changed for nearly a month. We will continue silver alginate and a heel cup for better padding of the wound. As far as the hemorrhoids are concerned, the case managing nurse and I both discussed the use of Desitin around his anus and use of Preparation H both in the anal canal and externally. The patient's son had innumerable questions about how this was to be administered. A diagram was provided. Follow-up in 2 weeks per patient's son request. Electronic Signature(s) Signed: 03/14/2022 3:26:14 PM By: Celine Ahr,  Anderson Malta MD FACS Entered By: Fredirick Maudlin on 03/14/2022 15:26:14 -------------------------------------------------------------------------------- HxROS Details Patient Name: Date of Service: Brian [name] ID M. 03/14/2022 2:30 PM Medical Record Number: [id number] Patient Account Number: [id number] Date of Birth/Sex: Treating RN: 1932-10-10 (88 y.o. [name] Primary Care Provider: Abelino Derrick Other Clinician: Referring Provider: Treating Provider/Extender: Sandi Raveling in Treatment: 4 Information Obtained From Patient Constitutional Symptoms (General Health) Medical History: Past Medical History Notes: obseity Eyes Medical History: Positive for: Cataracts - removed Ear/Nose/Mouth/Throat Medical History: Past Medical History Notes: wears hearing aids Cardiovascular Medical History: Positive for: Arrhythmia - Atrial Flutter/Atrial Fib; Coronary Artery Disease; Hypotension Negative for: Hypertension Past Medical History Notes: Mitral Valve Repair, Genitourinary Medical History: Past Medical History Notes: BPH Musculoskeletal Medical History: Positive for:  Osteoarthritis Neurologic Medical History: Negative for: Paraplegia Past Medical History Notes: CVA 2018, left side hemiparesis HBO Extended History Items Eyes: Cataracts Immunizations Pneumococcal Vaccine: Received Pneumococcal Vaccination: Yes Received Pneumococcal Vaccination On or After 60th Birthday: Yes Implantable Devices None Hospitalization / Surgery History Type of Hospitalization/Surgery open heart surgery IR generic histoircal appendectomy hernia repair joint replacement mitral valve repair mvp repair total hip arthroplasty Family and Social History Cancer: No; Diabetes: Yes - Maternal Grandparents; Heart Disease: Yes - Mother; Hereditary Spherocytosis: No; Hypertension: Yes - Mother; Kidney Disease: No; Lung Disease: No; Seizures: No; Stroke: No; Thyroid Problems: No; Tuberculosis: No; Never smoker; Marital Status - Married; Alcohol Use: Never; Drug Use: No History; Caffeine Use: Never; Financial Concerns: No; Food, Clothing or Shelter Needs: No; Support System Lacking: No; Transportation Concerns: No Electronic Signature(s) Signed: 03/14/2022 3:38:24 PM By: Dellie Catholic RN Signed: 03/15/2022 7:34:21 AM By: Fredirick Maudlin MD FACS Entered By: Fredirick Maudlin on 03/14/2022 15:19:13 -------------------------------------------------------------------------------- SuperBill Details Patient Name: Date of Service: Brian [name] ID M. 03/14/2022 Medical Record Number: [id number] Patient Account Number: [id number] Date of Birth/Sex: Treating RN: [birth year] (88 y.o. [name] Primary Care Provider: Abelino Derrick Other Clinician: Referring Provider: Treating Provider/Extender: Sandi Raveling in Treatment: 4 Diagnosis Coding ICD-10 Codes Code Description [id number] Pressure ulcer of left elbow, stage 3 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 I48.19 Other persistent atrial  fibrillation I25.10 Atherosclerotic heart disease of native coronary artery without angina pectoris Facility Procedures CPT4 Code: [id number] IC Description: 042 - DEB SUBQ TISSUE 20 SQ CM/< D-10 Diagnosis Description L89.023 Pressure ulcer of left elbow, stage 3 Modifier: 1 Quantity: Physician Procedures : CPT4 Code Description Modifier [id number] - WC PHYS LEVEL 4 - EST PT 25 ICD-10 Diagnosis Description L89.023 Pressure ulcer of left elbow, stage 3 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 I48.19 Other persistent  atrial fibrillation I25.10 Atherosclerotic heart disease of native coronary artery without angina pectoris Quantity: 1 : [id number] 11042 - WC PHYS SUBQ TISS 20 SQ CM ICD-10 Diagnosis Description L89.023 Pressure ulcer of left elbow, stage 3 Quantity: 1 Electronic Signature(s) Signed: 03/14/2022 3:26:38 PM By: Fredirick Maudlin MD FACS Entered By: Fredirick Maudlin on 03/14/2022 15:26:38

## 2022-03-15 NOTE — Plan of Care (Signed)

## 2022-03-15 NOTE — Progress Notes (Signed)
   03/15/22 1345  Clinical Encounter Type  Visited With Patient;Family  Visit Type Initial  Referral From Other (Comment) (Son approached chaplain in cafeteria)  Consult/Referral To Chaplain   Chaplain was approached by son of patient Brian [name] - [phone])in the cafeteria.   Chaplain noted that son was nervous and needed to talk, inviting him to join she and colleagues for lunch. Chaplain [employer] had provided prayer for patient in the past and he graciously provided prayer for patient as requested.  Son  indicated that they are of Jewish faith.  Melody Haver, Resident Chaplain [phone]

## 2022-03-15 NOTE — H&P (Signed)
History and Physical    Brian [name] FTD:[id number] DOB: 1933-06-21 DOA: 03/14/2022  PCP: Libby Maw, MD  Patient coming from: Home  Chief Complaint: Syncope  HPI: Brian [name] is a 88 y.o. male with medical history significant of persistent A-fib on Eliquis, CAD, BPH, hypertension, VTE, stroke, chronic left elbow pressure ulcer followed by wound care and seen at their office yesterday.  He presents to the ED via EMS after syncope at home.  He was found unresponsive in his recliner.  When EMS arrived, he was pale, diaphoretic, and hypotensive with systolic in the 06C.  EMS had difficulty feeling radial pulses initially.  They initiated IV fluids.  Patient did not fall or hit his head.  Patient awake and alert on arrival to the ED.  Afebrile and not tachycardic.  Blood pressure soft with systolic in the 37-628 range.  Not hypoxic.  Labs showing WBC 11.1, hemoglobin 10.5 (was 11.0 on labs done 2 weeks ago), platelet count 237k.  Sodium 137, potassium 3.7, chloride 104, bicarb 26, BUN 18, creatinine 0.9, glucose 115.  Lipase and LFTs normal.  INR 1.4.  FOBT positive but no grossly bloody stool on exam.  High sensitive troponin negative x2.  BNP 158.  Lactic acid normal.  CT head negative for acute finding.  CT angiogram chest negative for PE or evidence of acute cardiopulmonary disease.  CT abdomen pelvis negative for acute finding.  X-ray of left elbow negative for fracture or dislocation. Patient was given 1 L LR bolus.  Patient is not sure what happened.  States since after a stroke 5 years ago, he is wheelchair-bound.  He denies fevers, dizziness, headaches, chest pain, shortness of breath, cough, nausea, vomiting, abdominal pain, or diarrhea.  Does endorse dysuria.  Denies hematemesis, hematochezia, or melena.  States his grandson finished medical school in [place] and is currently training to be a gastroenterologist.  Review of Systems:  Review of Systems  All other systems  reviewed and are negative.   Past Medical History:  Diagnosis Date   Anemia    Atrial flutter (HCC)    BPH (benign prostatic hypertrophy)    Epistaxis    Hemorrhage of gastrointestinal tract, unspecified    History of open heart surgery    Hypertension    Obesity (BMI 30.0-34.9) 06/29/2020   Osteoarthrosis, unspecified whether generalized or localized, unspecified site    Personal history of venous thrombosis and embolism    Rosacea    Stroke ([place])    TIA (transient ischemic attack)     Past Surgical History:  Procedure Laterality Date   APPENDECTOMY     HERNIA REPAIR     IR GENERIC HISTORICAL  05/04/2016   IR PERCUTANEOUS ART THROMBECTOMY/INFUSION INTRACRANIAL INC DIAG ANGIO 05/04/2016 [name], MD [facility]   IR GENERIC HISTORICAL  05/04/2016   IR ANGIO VERTEBRAL SEL SUBCLAVIAN INNOMINATE UNI R MOD SED 05/04/2016 [name], MD [facility]   IR GENERIC HISTORICAL  06/07/2016   IR RADIOLOGIST EVAL & MGMT 06/07/2016 [facility]   JOINT REPLACEMENT     MITRAL VALVE REPAIR     mvp repair     RADIOLOGY WITH ANESTHESIA N/A 05/04/2016   Procedure: RADIOLOGY WITH ANESTHESIA;  Surgeon: [name], MD;  Location: [facility];  Service: Radiology;  Laterality: N/A;   TOTAL HIP ARTHROPLASTY       reports that he has quit smoking. He has never used smokeless tobacco. He reports that he does not drink alcohol  and does not use drugs.  Allergies  Allergen Reactions   Novocain [Procaine Hcl]     Irregular heart beat      Other     Patient had problem with epidural with his right hip surgery. Went to up extremity instead of lower extremity    Penicillins Itching    Has patient had a PCN reaction causing immediate rash, facial/tongue/throat swelling, SOB or lightheadedness with hypotension: Unk Has patient had a PCN reaction causing severe rash involving mucus membranes or skin necrosis: Unk Has patient had a PCN reaction that required hospitalization: Unk Has  patient had a PCN reaction occurring within the last 10 years: No If all of the above answers are "NO", then may proceed with Cephalosporin use.  No reaction noted    Family History  Problem Relation Age of Onset   Rheumatic fever Mother    Coronary artery disease Father     Prior to Admission medications   Medication Sig Start Date End Date Taking? Authorizing Provider  acetaminophen (TYLENOL) 500 MG tablet Take 500 mg by mouth every 6 (six) hours as needed.    [provider]  ALPRAZolam Duanne Moron) 0.25 MG tablet 2 (two) times daily.    [provider]  apixaban (ELIQUIS) 2.5 MG TABS tablet TAKE 1 TABLET(2.5 MG) BY MOUTH TWICE DAILY 08/02/21   Libby Maw, MD  cetirizine (ZYRTEC) 10 MG tablet TAKE 1 TABLET(10 MG) BY MOUTH AT BEDTIME 09/16/21   Libby Maw, MD  finasteride (PROSCAR) 5 MG tablet TAKE 1 TABLET(5 MG) BY MOUTH DAILY 01/09/22   Libby Maw, MD  hydrocortisone ointment 0.5 % hydrocortisone 1 % topical cream  APPLY A THIN LAYER TO THE AFFECTED AREA(S) BY TOPICAL ROUTE 2 TIMES PER DAY    [provider]  losartan (COZAAR) 25 MG tablet TAKE 1 TABLET(25 MG) BY MOUTH DAILY 08/08/21   Skeet Latch, MD  Melatonin 1 MG CAPS melatonin 5 mg tablet  Take 1 tablet every day by oral route at bedtime.    [provider]  metoprolol succinate (TOPROL-XL) 25 MG 24 hr tablet TAKE 1 TABLET(25 MG) BY MOUTH DAILY 06/30/21   Libby Maw, MD  Multiple Vitamin (MULTIVITAMIN) tablet Take 2 tablets by mouth daily.    [provider]  MUPIROCIN EX mupirocin 2 % topical ointment  APPLY A SMALL AMOUNT TO THE AFFECTED AREA BY TOPICAL ROUTE 3 TIMES PER DAY    [provider]  oxyCODONE-acetaminophen (PERCOCET) 7.5-325 MG tablet Take 1 tablet by mouth every 8 (eight) hours as needed for severe pain. 12/27/21   Libby Maw, MD  pantoprazole (PROTONIX) 40 MG tablet TAKE 1 TABLET(40 MG) BY MOUTH DAILY  03/02/22   Libby Maw, MD  polyethylene glycol powder [facility]) 17 GM/SCOOP powder Mix one scoop with water and take daily until stools loosen. May repeat as needed. 01/20/20   Libby Maw, MD  pravastatin (PRAVACHOL) 40 MG tablet TAKE 1 TABLET(40 MG) BY MOUTH DAILY 11/11/21   Libby Maw, MD  pregabalin (LYRICA) 25 MG capsule Take 1 capsule (25 mg total) by mouth 3 (three) times daily. Patient taking differently: Take 25 mg by mouth 2 (two) times daily. 09/09/21   Libby Maw, MD  QUEtiapine (SEROQUEL) 50 MG tablet TAKE 1 TABLET(50 MG) BY MOUTH AT BEDTIME 02/15/22   Libby Maw, MD  sertraline (ZOLOFT) 50 MG tablet TAKE 1 TABLET(50 MG) BY MOUTH EVERY MORNING 12/06/21   Libby Maw,  MD  tamsulosin (FLOMAX) 0.4 MG CAPS capsule TAKE 1 CAPSULE(0.4 MG) BY MOUTH DAILY 08/23/21   Libby Maw, MD    Physical Exam: Vitals:   03/14/22 2242 03/14/22 2245 03/14/22 2300 03/14/22 2315  BP:  (!) 129/50 115/60 (!) 120/53  Pulse:  71 83 63  Resp: '16 13 13 17  '$ Temp: 97.8 F (36.6 C)     TempSrc: Oral     SpO2:  100% 99% 100%  Weight:      Height:        Physical Exam Vitals reviewed.  Constitutional:      General: He is not in acute distress. HENT:     Head: Normocephalic and atraumatic.     Mouth/Throat:     Mouth: Mucous membranes are dry.  Eyes:     Extraocular Movements: Extraocular movements intact.  Cardiovascular:     Rate and Rhythm: Normal rate and regular rhythm.     Pulses: Normal pulses.  Pulmonary:     Effort: Pulmonary effort is normal. No respiratory distress.     Breath sounds: Normal breath sounds. No wheezing or rales.  Abdominal:     General: Bowel sounds are normal. There is no distension.     Palpations: Abdomen is soft.     Tenderness: There is no abdominal tenderness.  Musculoskeletal:     Cervical back: Normal range of motion.     Right lower leg: Edema present.     Left lower leg:  Edema present.     Comments: Left elbow with chronic pressure ulcer which does not appear grossly infected Bilateral pedal edema present  Skin:    General: Skin is warm and dry.  Neurological:     General: No focal deficit present.     Mental Status: He is alert and oriented to person, place, and time.      Labs on Admission: I have personally reviewed following labs and imaging studies  CBC: Recent Labs  Lab 03/14/22 1904  WBC 11.1*  NEUTROABS 8.9*  HGB 10.5*  HCT 33.9*  MCV 95.8  PLT 712   Basic Metabolic Panel: Recent Labs  Lab 03/14/22 1904  NA 137  K 3.7  CL 104  CO2 26  GLUCOSE 115*  BUN 18  CREATININE 0.98  CALCIUM 8.8*   GFR: Estimated Creatinine Clearance: 56 mL/min (by C-G formula based on SCr of 0.98 mg/dL). Liver Function Tests: Recent Labs  Lab 03/14/22 1904  AST 25  ALT 16  ALKPHOS 68  BILITOT 0.8  PROT 6.3*  ALBUMIN 3.2*   Recent Labs  Lab 03/14/22 1904  LIPASE 23   No results for input(s): "AMMONIA" in the last 168 hours. Coagulation Profile: Recent Labs  Lab 03/14/22 1904  INR 1.4*   Cardiac Enzymes: No results for input(s): "CKTOTAL", "CKMB", "CKMBINDEX", "TROPONINI" in the last 168 hours. BNP (last 3 results) No results for input(s): "PROBNP" in the last 8760 hours. HbA1C: No results for input(s): "HGBA1C" in the last 72 hours. CBG: No results for input(s): "GLUCAP" in the last 168 hours. Lipid Profile: No results for input(s): "CHOL", "HDL", "LDLCALC", "TRIG", "CHOLHDL", "LDLDIRECT" in the last 72 hours. Thyroid Function Tests: No results for input(s): "TSH", "T4TOTAL", "FREET4", "T3FREE", "THYROIDAB" in the last 72 hours. Anemia Panel: No results for input(s): "VITAMINB12", "FOLATE", "FERRITIN", "TIBC", "IRON", "RETICCTPCT" in the last 72 hours. Urine analysis:    Component Value Date/Time   COLORURINE YELLOW 12/07/2021 1001   APPEARANCEUR Cloudy (A) 12/07/2021 1001  LABSPEC 1.010 12/07/2021 1001   PHURINE 6.0  12/07/2021 1001   GLUCOSEU NEGATIVE 12/07/2021 1001   HGBUR MODERATE (A) 12/07/2021 1001   BILIRUBINUR NEGATIVE 12/07/2021 1001   BILIRUBINUR neg 07/21/2013 1211   KETONESUR NEGATIVE 12/07/2021 1001   PROTEINUR 1+ (A) 09/27/2021 1633   UROBILINOGEN 0.2 12/07/2021 1001   NITRITE POSITIVE (A) 12/07/2021 1001   LEUKOCYTESUR LARGE (A) 12/07/2021 1001    Radiological Exams on Admission: I have personally reviewed images CT ABDOMEN PELVIS W CONTRAST  Result Date: 03/15/2022 CLINICAL DATA:  Syncope, hypotension, abdominal pain. EXAM: CT ANGIOGRAPHY CHEST CT ABDOMEN AND PELVIS WITH CONTRAST TECHNIQUE: Multidetector CT imaging of the chest was performed using the standard protocol during bolus administration of intravenous contrast. Multiplanar CT image reconstructions and MIPs were obtained to evaluate the vascular anatomy. Multidetector CT imaging of the abdomen and pelvis was performed using the standard protocol during bolus administration of intravenous contrast. RADIATION DOSE REDUCTION: This exam was performed according to the departmental dose-optimization program which includes automated exposure control, adjustment of the mA and/or kV according to patient size and/or use of iterative reconstruction technique. CONTRAST:  158m OMNIPAQUE IOHEXOL 350 MG/ML SOLN COMPARISON:  Chest radiograph dated 03/14/2022 FINDINGS: CTA CHEST FINDINGS Cardiovascular: Satisfactory opacification of the bilateral pulmonary arteries to the segmental level. No evidence of pulmonary embolism. Study is not tailored for evaluation of the thoracic aorta. No evidence of thoracic aortic aneurysm. Atherosclerotic calcifications of the arch. Cardiomegaly.  No pericardial effusion.  Prosthetic mitral valve. Coronary atherosclerosis of the LAD. Mediastinum/Nodes: No suspicious mediastinal lymphadenopathy. Visualized thyroid is unremarkable. Lungs/Pleura: Eventration of left hemidiaphragm with associated lingular and left lower lobe  atelectasis. Mild atelectasis in the posterior upper lobes and medial right lower lobe. No focal consolidation. Mild centrilobular and paraseptal emphysematous changes, upper lung predominant. Evaluation lung parenchyma is constrained by respiratory motion. Within that constraint, there are no suspicious pulmonary nodules. No pleural effusion or pneumothorax. Musculoskeletal: Mild degenerative changes of the lower thoracic spine. Vertebral hemangioma at T3. Median sternotomy. Review of the MIP images confirms the above findings. CT ABDOMEN and PELVIS FINDINGS Hepatobiliary: Scattered hepatic cysts measuring up to 19 mm in segment 4. Gallbladder is unremarkable. No intrahepatic or extrahepatic ductal dilatation. Pancreas: Mild parenchymal atrophy. Spleen: Within normal limits. Adrenals/Urinary Tract: Adrenal glands are within normal limits. Bilateral renal cysts, measuring up to 3.2 cm in the right upper kidney (series 5/image 23), benign (Bosniak II). No hydronephrosis. Bladder is within normal limits, noting streak artifact. Stomach/Bowel: Stomach is within normal limits. No evidence of bowel obstruction. Appendix is not discretely visualized. Scattered colonic diverticulosis, without evidence of diverticulitis. Vascular/Lymphatic: No evidence of abdominal aortic aneurysm. Atherosclerotic calcifications of the abdominal aorta and branch vessels. No suspicious abdominopelvic lymphadenopathy. Reproductive: Prostatomegaly, suggesting BPH. Other: No abdominopelvic ascites. Mild presacral fluid/stranding (series 3/image 63). Musculoskeletal: Right hip arthroplasty, without evidence of complication. Degenerative changes of the visualized thoracolumbar spine. Median sternotomy. Review of the MIP images confirms the above findings. IMPRESSION: No evidence of pulmonary embolism. No evidence of acute cardiopulmonary disease. No acute findings in the abdomen/pelvis. Additional ancillary findings as above. Electronically  Signed   By: SJulian HyM.D.   On: 03/15/2022 00:39   CT Angio Chest PE W and/or Wo Contrast  Result Date: 03/15/2022 CLINICAL DATA:  Syncope, hypotension, abdominal pain. EXAM: CT ANGIOGRAPHY CHEST CT ABDOMEN AND PELVIS WITH CONTRAST TECHNIQUE: Multidetector CT imaging of the chest was performed using the standard protocol during bolus administration of intravenous contrast. Multiplanar CT  image reconstructions and MIPs were obtained to evaluate the vascular anatomy. Multidetector CT imaging of the abdomen and pelvis was performed using the standard protocol during bolus administration of intravenous contrast. RADIATION DOSE REDUCTION: This exam was performed according to the departmental dose-optimization program which includes automated exposure control, adjustment of the mA and/or kV according to patient size and/or use of iterative reconstruction technique. CONTRAST:  181m OMNIPAQUE IOHEXOL 350 MG/ML SOLN COMPARISON:  Chest radiograph dated 03/14/2022 FINDINGS: CTA CHEST FINDINGS Cardiovascular: Satisfactory opacification of the bilateral pulmonary arteries to the segmental level. No evidence of pulmonary embolism. Study is not tailored for evaluation of the thoracic aorta. No evidence of thoracic aortic aneurysm. Atherosclerotic calcifications of the arch. Cardiomegaly.  No pericardial effusion.  Prosthetic mitral valve. Coronary atherosclerosis of the LAD. Mediastinum/Nodes: No suspicious mediastinal lymphadenopathy. Visualized thyroid is unremarkable. Lungs/Pleura: Eventration of left hemidiaphragm with associated lingular and left lower lobe atelectasis. Mild atelectasis in the posterior upper lobes and medial right lower lobe. No focal consolidation. Mild centrilobular and paraseptal emphysematous changes, upper lung predominant. Evaluation lung parenchyma is constrained by respiratory motion. Within that constraint, there are no suspicious pulmonary nodules. No pleural effusion or  pneumothorax. Musculoskeletal: Mild degenerative changes of the lower thoracic spine. Vertebral hemangioma at T3. Median sternotomy. Review of the MIP images confirms the above findings. CT ABDOMEN and PELVIS FINDINGS Hepatobiliary: Scattered hepatic cysts measuring up to 19 mm in segment 4. Gallbladder is unremarkable. No intrahepatic or extrahepatic ductal dilatation. Pancreas: Mild parenchymal atrophy. Spleen: Within normal limits. Adrenals/Urinary Tract: Adrenal glands are within normal limits. Bilateral renal cysts, measuring up to 3.2 cm in the right upper kidney (series 5/image 23), benign (Bosniak II). No hydronephrosis. Bladder is within normal limits, noting streak artifact. Stomach/Bowel: Stomach is within normal limits. No evidence of bowel obstruction. Appendix is not discretely visualized. Scattered colonic diverticulosis, without evidence of diverticulitis. Vascular/Lymphatic: No evidence of abdominal aortic aneurysm. Atherosclerotic calcifications of the abdominal aorta and branch vessels. No suspicious abdominopelvic lymphadenopathy. Reproductive: Prostatomegaly, suggesting BPH. Other: No abdominopelvic ascites. Mild presacral fluid/stranding (series 3/image 63). Musculoskeletal: Right hip arthroplasty, without evidence of complication. Degenerative changes of the visualized thoracolumbar spine. Median sternotomy. Review of the MIP images confirms the above findings. IMPRESSION: No evidence of pulmonary embolism. No evidence of acute cardiopulmonary disease. No acute findings in the abdomen/pelvis. Additional ancillary findings as above. Electronically Signed   By: SJulian HyM.D.   On: 03/15/2022 00:39   CT Head Wo Contrast  Result Date: 03/15/2022 CLINICAL DATA:  Mental status change EXAM: CT HEAD WITHOUT CONTRAST TECHNIQUE: Contiguous axial images were obtained from the base of the skull through the vertex without intravenous contrast. RADIATION DOSE REDUCTION: This exam was performed  according to the departmental dose-optimization program which includes automated exposure control, adjustment of the mA and/or kV according to patient size and/or use of iterative reconstruction technique. COMPARISON:  CT brain 10/31/2017 report, 05/10/2016 FINDINGS: Brain: No acute territorial infarction, hemorrhage or intracranial mass. Chronic right MCA infarct with encephalomalacia and ex vacuo dilatation of the right ventricle. Small chronic appearing infarct within the left posterior parietal white matter. Atrophy. Mild chronic small vessel ischemic changes of the white matter. Vascular: No hyperdense vessels.  No unexpected calcification Skull: Normal. Negative for fracture or focal lesion. Sinuses/Orbits: No acute finding. Other: None. IMPRESSION: 1. No CT evidence for acute intracranial abnormality. 2. Chronic right MCA infarct. Atrophy and mild chronic small vessel ischemic changes of the white matter. Electronically Signed   By: KMaudie Mercury  Francoise Ceo M.D.   On: 03/15/2022 00:14   DG Elbow Complete Left  Result Date: 03/14/2022 CLINICAL DATA:  Left elbow pain after syncope with fall. Wound on the posterior elbow. EXAM: LEFT ELBOW - COMPLETE 3+ VIEW COMPARISON:  None Available. FINDINGS: Diffuse bone demineralization. No evidence of acute fracture or dislocation of the left elbow. No focal bone lesion or bone destruction. No significant effusion. Soft tissue swelling over the posterior left elbow consistent with history of wound, likely contusion. IMPRESSION: Posterior soft tissue swelling.  No acute fracture or dislocation. Electronically Signed   By: Lucienne Capers M.D.   On: 03/14/2022 19:05   DG Chest Portable 1 View  Result Date: 03/14/2022 CLINICAL DATA:  Syncope.  Found on the floor.  Pale. EXAM: PORTABLE CHEST 1 VIEW COMPARISON:  11/03/2018 FINDINGS: Postoperative changes in the mediastinum. Cardiac valve prosthesis. Cardiac enlargement. No vascular congestion. Small left pleural effusion with  basilar infiltration or atelectasis. No pneumothorax. Degenerative changes in the spine and shoulders. IMPRESSION: Cardiac enlargement. Small left pleural effusion with basilar atelectasis or infiltration. Electronically Signed   By: Lucienne Capers M.D.   On: 03/14/2022 19:04    EKG: Independently reviewed.  Rate controlled A-fib, no acute changes.  Assessment and Plan  Syncope Possible vasovagal event.  He was sitting in a recliner when this happened.  Has chronic A-fib and it is rate controlled.  ACS less likely as high-sensitivity troponin negative x2.  CTA chest negative for PE.  CT head negative for acute finding.  No signs of infection or sepsis.  WBC count only borderline elevated and he is not febrile.  Lactic acid normal.  No evidence of acute infectious process on imaging of chest/abdomen/pelvis.  Does have a chronic left elbow pressure ulcer which does not appear grossly infected and was seen by wound care yesterday.  FOBT positive but no grossly bloody stool on exam.  Anemia is stable.  Patient denies any symptoms of GI bleed.  He received 1 L fluid bolus in the ED and currently normotensive with systolic in the 101B. -Continue IV fluid hydration -Hold antihypertensives at this time and monitor blood pressure closely -Monitor CBC  Dysuria -UA ordered  Persistent A-fib -Hold Eliquis at this time and repeat CBC in a.m. If hemoglobin stable, then consider resuming Eliquis.  -Hold beta-blocker at this time to avoid hypotension.  CAD Work-up not suggestive of ACS. -Consider resuming Eliquis in the morning if hemoglobin is stable. -Continue statin -Hold beta-blocker at this time to avoid hypotension  BPH -Continue home meds after pharmacy med rec is done.  Hypertension -Hold antihypertensives at this time.  Hyperlipidemia -Continue statin after pharmacy med rec is done.  History of stroke -Consider resuming Eliquis in the morning if hemoglobin is stable. -Continue statin  after pharmacy med rec is done.  Depression and anxiety -Continue home meds after pharmacy med rec is done.  GERD -Continue PPI after pharmacy med rec is done.  DVT prophylaxis: SCDs Code Status: Full Code (discussed with the patient) Family Communication: No family available at this time. Level of care: Progressive Care Unit Admission status: It is my clinical opinion that referral for OBSERVATION is reasonable and necessary in this patient based on the above information provided. The aforementioned taken together are felt to place the patient at high risk for further clinical deterioration. However, it is anticipated that the patient may be medically stable for discharge from the hospital within 24 to 48 hours.   Shela Leff MD [facility]  If 7PM-7AM, please contact night-coverage [email or web address]  03/15/2022, 1:52 AM

## 2022-03-15 NOTE — Progress Notes (Signed)
Pt. Family (son) verbalized understanding of discharge order

## 2022-03-16 DIAGNOSIS — I4819 Other persistent atrial fibrillation: Secondary | ICD-10-CM | POA: Diagnosis not present

## 2022-03-16 DIAGNOSIS — I69354 Hemiplegia and hemiparesis following cerebral infarction affecting left non-dominant side: Secondary | ICD-10-CM | POA: Diagnosis not present

## 2022-03-16 DIAGNOSIS — I2510 Atherosclerotic heart disease of native coronary artery without angina pectoris: Secondary | ICD-10-CM | POA: Diagnosis not present

## 2022-03-16 DIAGNOSIS — Z7901 Long term (current) use of anticoagulants: Secondary | ICD-10-CM | POA: Diagnosis not present

## 2022-03-16 DIAGNOSIS — L89323 Pressure ulcer of left buttock, stage 3: Secondary | ICD-10-CM | POA: Diagnosis not present

## 2022-03-16 DIAGNOSIS — L89023 Pressure ulcer of left elbow, stage 3: Secondary | ICD-10-CM | POA: Diagnosis not present

## 2022-03-17 ENCOUNTER — Encounter: Payer: Self-pay | Admitting: Family Medicine

## 2022-03-23 DIAGNOSIS — L89023 Pressure ulcer of left elbow, stage 3: Secondary | ICD-10-CM | POA: Diagnosis not present

## 2022-03-23 DIAGNOSIS — Z7901 Long term (current) use of anticoagulants: Secondary | ICD-10-CM | POA: Diagnosis not present

## 2022-03-23 DIAGNOSIS — I69354 Hemiplegia and hemiparesis following cerebral infarction affecting left non-dominant side: Secondary | ICD-10-CM | POA: Diagnosis not present

## 2022-03-23 DIAGNOSIS — L89323 Pressure ulcer of left buttock, stage 3: Secondary | ICD-10-CM | POA: Diagnosis not present

## 2022-03-23 DIAGNOSIS — I2510 Atherosclerotic heart disease of native coronary artery without angina pectoris: Secondary | ICD-10-CM | POA: Diagnosis not present

## 2022-03-23 DIAGNOSIS — I4819 Other persistent atrial fibrillation: Secondary | ICD-10-CM | POA: Diagnosis not present

## 2022-03-24 ENCOUNTER — Other Ambulatory Visit: Payer: Self-pay | Admitting: Family Medicine

## 2022-03-24 DIAGNOSIS — G894 Chronic pain syndrome: Secondary | ICD-10-CM

## 2022-03-28 ENCOUNTER — Encounter (HOSPITAL_BASED_OUTPATIENT_CLINIC_OR_DEPARTMENT_OTHER): Payer: Medicare Other | Attending: General Surgery | Admitting: General Surgery

## 2022-03-29 DIAGNOSIS — Z7901 Long term (current) use of anticoagulants: Secondary | ICD-10-CM | POA: Diagnosis not present

## 2022-03-29 DIAGNOSIS — I10 Essential (primary) hypertension: Secondary | ICD-10-CM | POA: Diagnosis not present

## 2022-03-29 DIAGNOSIS — I69354 Hemiplegia and hemiparesis following cerebral infarction affecting left non-dominant side: Secondary | ICD-10-CM | POA: Diagnosis not present

## 2022-03-29 DIAGNOSIS — I4819 Other persistent atrial fibrillation: Secondary | ICD-10-CM | POA: Diagnosis not present

## 2022-03-29 DIAGNOSIS — L89323 Pressure ulcer of left buttock, stage 3: Secondary | ICD-10-CM | POA: Diagnosis not present

## 2022-03-29 DIAGNOSIS — M179 Osteoarthritis of knee, unspecified: Secondary | ICD-10-CM | POA: Diagnosis not present

## 2022-03-29 DIAGNOSIS — L89023 Pressure ulcer of left elbow, stage 3: Secondary | ICD-10-CM | POA: Diagnosis not present

## 2022-03-29 DIAGNOSIS — I2510 Atherosclerotic heart disease of native coronary artery without angina pectoris: Secondary | ICD-10-CM | POA: Diagnosis not present

## 2022-03-30 ENCOUNTER — Telehealth: Payer: Self-pay

## 2022-03-30 DIAGNOSIS — Z7901 Long term (current) use of anticoagulants: Secondary | ICD-10-CM | POA: Diagnosis not present

## 2022-03-30 DIAGNOSIS — L89023 Pressure ulcer of left elbow, stage 3: Secondary | ICD-10-CM | POA: Diagnosis not present

## 2022-03-30 DIAGNOSIS — L89323 Pressure ulcer of left buttock, stage 3: Secondary | ICD-10-CM | POA: Diagnosis not present

## 2022-03-30 DIAGNOSIS — I69354 Hemiplegia and hemiparesis following cerebral infarction affecting left non-dominant side: Secondary | ICD-10-CM | POA: Diagnosis not present

## 2022-03-30 DIAGNOSIS — I4819 Other persistent atrial fibrillation: Secondary | ICD-10-CM | POA: Diagnosis not present

## 2022-03-30 DIAGNOSIS — I2510 Atherosclerotic heart disease of native coronary artery without angina pectoris: Secondary | ICD-10-CM | POA: Diagnosis not present

## 2022-03-30 NOTE — Progress Notes (Signed)
Chronic Care Management Pharmacy Assistant   Name: Brian [name]  MRN: [id number] DOB: 1932/09/05  Reason for Encounter:Hypertension Disease State Call.   Recent office visits:  01/31/2022 Dr. Ethelene Hal MD (Pcp) Start DynaGinate Ag Silver Cal 4"x5" weekly,AMB referral to wound care center , Return in about 3 months  12/06/2021 Dr. Grandville Silos MD (PCP) No Medication Changes noted 11/01/2021 Dr. Ethelene Hal MD (PCP) No Medication Changes noted, Return in about 3 months   Recent consult visits:  03/14/2022  Dr. Celine Ahr MD (General Surgery) No Medication Changes noted 02/14/2022 Dr. Celine Ahr MD (General Surgery) No Medication Changes noted 01/10/2022 Jannah Plaster PT (Physical Therapy) No Medication Changes noted 12/27/2021 Dr. Holley Raring MD (Pain Medicine) No Medication Changes noted  Hospital visits:  Medication Reconciliation was completed by comparing discharge summary, patient's EMR and Pharmacy list, and upon discussion with patient.  Admitted to the hospital on 03/14/2022 due to Syncope. Discharge date was 03/15/2022. Discharged from [place]?Medications Started at [facility] Discharge:?? -started None ID  Medication Changes at Hospital Discharge: -Changed None ID  Medications Discontinued at Hospital Discharge: -Stopped losartan  Medications that remain the same after Hospital Discharge:??  -All other medications will remain the same.    Admitted to the hospital on 02/28/2022 due to Wound Check. Discharge date was 02/28/2022. Discharged from [place]?Medications Started at [facility] Discharge:?? -started None ID  Medication Changes at Hospital Discharge: -Changed None ID  Medications Discontinued at Hospital Discharge: -Stopped None ID  Medications that remain the same after Hospital Discharge:??  -All other medications will remain the same.    Medications: Outpatient Encounter Medications as of 03/30/2022  Medication  Sig   acetaminophen (TYLENOL) 500 MG tablet Take 1,000 mg by mouth every 6 (six) hours as needed for mild pain.   ALPRAZolam (XANAX) 0.25 MG tablet Take 0.25 mg by mouth 2 (two) times daily as needed for anxiety.   apixaban (ELIQUIS) 2.5 MG TABS tablet TAKE 1 TABLET(2.5 MG) BY MOUTH TWICE DAILY (Patient taking differently: Take 2.5 mg by mouth 2 (two) times daily.)   cetirizine (ZYRTEC) 10 MG tablet TAKE 1 TABLET(10 MG) BY MOUTH AT BEDTIME (Patient taking differently: Take 10 mg by mouth at bedtime as needed for allergies.)   finasteride (PROSCAR) 5 MG tablet TAKE 1 TABLET(5 MG) BY MOUTH DAILY (Patient taking differently: Take 5 mg by mouth daily.)   fluticasone (FLONASE) 50 MCG/ACT nasal spray Place 1 spray into both nostrils daily as needed for allergies.   hydrocortisone ointment 0.5 % Apply 1 Application topically daily as needed for itching.   melatonin 5 MG TABS Take 5 mg by mouth at bedtime as needed (sleep).   metoprolol succinate (TOPROL-XL) 25 MG 24 hr tablet TAKE 1 TABLET(25 MG) BY MOUTH DAILY (Patient taking differently: Take 25 mg by mouth daily.)   Multiple Vitamin (MULTIVITAMIN) tablet Take 1 tablet by mouth 2 (two) times daily.   MUPIROCIN EX Apply 1 Application topically daily as needed (wound care).   oxyCODONE-acetaminophen (PERCOCET) 7.5-325 MG tablet Take 1 tablet by mouth every 8 (eight) hours as needed for severe pain.   pantoprazole (PROTONIX) 40 MG tablet TAKE 1 TABLET(40 MG) BY MOUTH DAILY (Patient taking differently: Take 40 mg by mouth daily.)   polyethylene glycol powder (GLYCOLAX/MIRALAX) 17 GM/SCOOP powder Mix one scoop with water and take daily until stools loosen. May repeat as needed. (Patient taking differently: Take 17 g by mouth daily as  needed for mild constipation.)   pravastatin (PRAVACHOL) 40 MG tablet TAKE 1 TABLET(40 MG) BY MOUTH DAILY (Patient taking differently: Take 40 mg by mouth daily.)   QUEtiapine (SEROQUEL) 50 MG tablet TAKE 1 TABLET(50 MG) BY MOUTH  AT BEDTIME (Patient taking differently: Take 50 mg by mouth at bedtime.)   sertraline (ZOLOFT) 50 MG tablet TAKE 1 TABLET(50 MG) BY MOUTH EVERY MORNING (Patient taking differently: Take 50 mg by mouth daily.)   tamsulosin (FLOMAX) 0.4 MG CAPS capsule Take 1 capsule (0.4 mg total) by mouth daily after supper.   No facility-administered encounter medications on file as of 03/30/2022.    Care Gaps: Influenza Vaccine    Star Rating Drugs: Pravastatin 40 mg  last filled 06/016/2023 90 day supply at [facility].   Medication Fill Gaps: None  Reviewed chart prior to disease state call. Spoke with patient regarding BP  Recent Office Vitals: BP Readings from Last 3 Encounters:  03/15/22 (!) 114/45  02/28/22 (!) 113/46  01/31/22 115/66   Pulse Readings from Last 3 Encounters:  03/15/22 71  02/28/22 (!) 56  01/31/22 (!) 57    Wt Readings from Last 3 Encounters:  03/14/22 185 lb (83.9 kg)  02/28/22 185 lb (83.9 kg)  01/31/22 189 lb (85.7 kg)     Kidney Function Lab Results  Component Value Date/Time   CREATININE 0.98 03/14/2022 07:04 PM   CREATININE 0.68 02/28/2022 05:04 PM   CREATININE 0.63 (L) 02/06/2020 05:06 PM   CREATININE 0.64 (L) 10/03/2016 11:57 AM   GFR 84.36 11/01/2021 03:28 PM   GFRNONAA >60 03/14/2022 07:04 PM   GFRNONAA >60 01/23/2011 09:20 AM   GFRAA >60 11/03/2018 07:35 PM   GFRAA >60 01/23/2011 09:20 AM       Latest Ref Rng & Units 03/14/2022    7:04 PM 02/28/2022    5:04 PM 11/01/2021    3:28 PM  BMP  Glucose 70 - 99 mg/dL 115  95  126   BUN 8 - 23 mg/dL '18  15  15   '$ Creatinine 0.61 - 1.24 mg/dL 0.98  0.68  0.67   Sodium 135 - 145 mmol/L 137  139  139   Potassium 3.5 - 5.1 mmol/L 3.7  4.5  4.3   Chloride 98 - 111 mmol/L 104  104  105   CO2 22 - 32 mmol/L '26  25  30   '$ Calcium 8.9 - 10.3 mg/dL 8.8  9.0  9.0     Current antihypertensive regimen:  None ID Patient states he does not take anything for his blood pressure anymore, and denies  dizziness,lightheadedness or headaches.  How often are you checking your Blood Pressure? 3-5x per week  Current home BP readings:  Patient reports his blood pressure was "good this morning",but unsure what his readings was.  What recent interventions/DTPs have been made by any provider to improve Blood Pressure control since last CPP Visit:  03/14/2022 ED Visit stop Losartan  Any recent hospitalizations or ED visits since last visit with CPP? Yes  What diet changes have been made to improve Blood Pressure Control?  Patient reports he add some salt to his foods which "made me feel better".  What exercise is being done to improve your Blood Pressure Control?  None ID  Adherence Review: Is the patient currently on ACE/ARB medication? No Does the patient have >5 day gap between last estimated fill dates? No  Telephone follow up appointment with care management team member scheduled for:  06/22/2022  at 3:45 PM  [place] Pharmacist Assistant [phone]

## 2022-03-31 ENCOUNTER — Other Ambulatory Visit: Payer: Self-pay | Admitting: Family Medicine

## 2022-03-31 ENCOUNTER — Telehealth: Payer: Self-pay | Admitting: Family Medicine

## 2022-03-31 DIAGNOSIS — G894 Chronic pain syndrome: Secondary | ICD-10-CM

## 2022-03-31 MED ORDER — OXYCODONE-ACETAMINOPHEN 7.5-325 MG PO TABS: 1.0000 | ORAL_TABLET | Freq: Three times a day (TID) | ORAL | 0 refills | Status: DC | PRN

## 2022-03-31 NOTE — Telephone Encounter (Signed)
Refill request for pending Rx last OV 01/31/22 last refill 90 days supply 12/27/21. Please advise

## 2022-03-31 NOTE — Telephone Encounter (Signed)
Caller Name: lance Gorczyca  Call back phone #: [phone] or [phone]  MEDICATION(S): oxyCODONE-acetaminophen (PERCOCET) 7.5-325 MG tablet   Days of Med Remaining:   Has the patient contacted their pharmacy (YES/NO)? no What did pharmacy advise?  Preferred Pharmacy:  [facility], [place] - [address] AT [facility] OF [place] Phone:  [phone]      ~~~Please advise patient/caregiver to allow 2-3 business days to process RX refills.

## 2022-03-31 NOTE — Telephone Encounter (Signed)
Caller Name: Torry Istre Call back phone #: [phone]  MEDICATION(S): Oxycodone, tamsulsoin   Preferred Pharmacy: [facility] [place], [place]  ~~~Please advise patient/caregiver to allow 2-3 business days to process RX refills.

## 2022-04-03 ENCOUNTER — Other Ambulatory Visit: Payer: Self-pay | Admitting: Family Medicine

## 2022-04-03 DIAGNOSIS — G894 Chronic pain syndrome: Secondary | ICD-10-CM

## 2022-04-03 NOTE — Telephone Encounter (Signed)
Please advise message below patients daughter calling for refill on Lyrica looks like this was discontinued January 2023.

## 2022-04-03 NOTE — Telephone Encounter (Signed)
Caller Name: Everardo Beals Call back phone #: [phone]  Reason for Call: called to update, brother has picked up oxycodone. They are now saying that he is needing his pregabalin '25mg'$ 

## 2022-04-07 ENCOUNTER — Encounter (HOSPITAL_BASED_OUTPATIENT_CLINIC_OR_DEPARTMENT_OTHER): Payer: Medicare Other | Attending: General Surgery | Admitting: General Surgery

## 2022-04-07 ENCOUNTER — Encounter (HOSPITAL_BASED_OUTPATIENT_CLINIC_OR_DEPARTMENT_OTHER): Payer: Self-pay

## 2022-04-07 DIAGNOSIS — I69354 Hemiplegia and hemiparesis following cerebral infarction affecting left non-dominant side: Secondary | ICD-10-CM | POA: Diagnosis not present

## 2022-04-07 DIAGNOSIS — I2510 Atherosclerotic heart disease of native coronary artery without angina pectoris: Secondary | ICD-10-CM | POA: Diagnosis not present

## 2022-04-07 DIAGNOSIS — L89023 Pressure ulcer of left elbow, stage 3: Secondary | ICD-10-CM | POA: Diagnosis not present

## 2022-04-07 DIAGNOSIS — I4819 Other persistent atrial fibrillation: Secondary | ICD-10-CM | POA: Diagnosis not present

## 2022-04-07 DIAGNOSIS — I10 Essential (primary) hypertension: Secondary | ICD-10-CM | POA: Insufficient documentation

## 2022-04-07 DIAGNOSIS — Z86718 Personal history of other venous thrombosis and embolism: Secondary | ICD-10-CM | POA: Diagnosis not present

## 2022-04-07 NOTE — Progress Notes (Signed)
RUVIM, RISKO ([id number]) Visit Report for 04/07/2022 Arrival Information Details Patient Name: Date of Service: [name] ID M. 04/07/2022 12:30 PM Medical Record Number: [id number] Patient Account Number: [id number] Date of Birth/Sex: Treating RN: September 15, 1932 (88 y.o. [name] Primary Care Refugio Vandevoorde: Abelino Derrick Other Clinician: Referring Carrin Vannostrand: Treating Isaura Schiller/Extender: Sandi Raveling in Treatment: 7 Visit Information History Since Last Visit All ordered tests and consults were completed: Yes Patient Arrived: Wheel Chair Added or deleted any medications: No Arrival Time: 12:58 Any new allergies or adverse reactions: No Accompanied By: son Had a fall or experienced change in No Transfer Assistance: Manual activities of daily living that may affect Patient Identification Verified: Yes risk of falls: Patient Requires Transmission-Based Precautions: No Signs or symptoms of abuse/neglect since last visito No Patient Has Alerts: Yes Hospitalized since last visit: No Patient Alerts: Patient on Blood Thinner Implantable device outside of the clinic excluding No cellular tissue based products placed in the center since last visit: Has Dressing in Place as Prescribed: Yes Pain Present Now: No Electronic Signature(s) Signed: 04/07/2022 4:54:12 PM By: Blanche East RN Entered By: Blanche East on 04/07/2022 13:15:56 -------------------------------------------------------------------------------- Encounter Discharge Information Details Patient Name: Date of Service: [name] ID M. 04/07/2022 12:30 PM Medical Record Number: [id number] Patient Account Number: [id number] Date of Birth/Sex: Treating RN: [birth year] (88 y.o. [name] Primary Care Tenzin Edelman: Abelino Derrick Other Clinician: Referring Sammi Stolarz: Treating Tristine Langi/Extender: Sandi Raveling in Treatment: 7 Encounter Discharge Information Items  Post Procedure Vitals Discharge Condition: Stable Temperature (F): 97.6 Ambulatory Status: Ambulatory Pulse (bpm): 73 Discharge Destination: Home Respiratory Rate (breaths/min): 18 Transportation: Private Auto Blood Pressure (mmHg): 125/73 Accompanied By: son/ aide Schedule Follow-up Appointment: Yes Clinical Summary of Care: Electronic Signature(s) Signed: 04/07/2022 4:54:12 PM By: Blanche East RN Entered By: Blanche East on 04/07/2022 14:14:48 -------------------------------------------------------------------------------- Lower Extremity Assessment Details Patient Name: Date of Service: [name] ID M. 04/07/2022 12:30 PM Medical Record Number: [id number] Patient Account Number: [id number] Date of Birth/Sex: Treating RN: 11/29/1932 (88 y.o. [name] Primary Care Smayan Hackbart: Abelino Derrick Other Clinician: Referring Lajoy Vanamburg: Treating Shanty Ginty/Extender: Sandi Raveling in Treatment: 7 Electronic Signature(s) Signed: 04/07/2022 4:54:12 PM By: Blanche East RN Entered By: Blanche East on 04/07/2022 12:59:33 -------------------------------------------------------------------------------- Multi Wound Chart Details Patient Name: Date of Service: [name] ID M. 04/07/2022 12:30 PM Medical Record Number: [id number] Patient Account Number: [id number] Date of Birth/Sex: Treating RN: 1933-07-26 (88 y.o. [name] Primary Care Daylon Lafavor: Abelino Derrick Other Clinician: Referring Lateefah Mallery: Treating Jaran Sainz/Extender: Sandi Raveling in Treatment: 7 Vital Signs Height(in): 66 Pulse(bpm): 73 Weight(lbs): 185 Blood Pressure(mmHg): 125/73 Body Mass Index(BMI): 29.9 Temperature(F): 97.6 Respiratory Rate(breaths/min): 18 Photos: [N/A:N/A] Left Elbow N/A N/A Wound Location: Pressure Injury N/A N/A Wounding Event: Pressure Ulcer N/A N/A Primary Etiology: Cataracts, Arrhythmia, Coronary N/A N/A Comorbid  History: Artery Disease, Hypotension, Osteoarthritis 12/19/2021 N/A N/A Date Acquired: 7 N/A N/A Weeks of Treatment: Open N/A N/A Wound Status: No N/A N/A Wound Recurrence: Yes N/A N/A Clustered Wound: 3x3x0.3 N/A N/A Measurements L x W x D (cm) 7.069 N/A N/A A (cm) : rea 2.121 N/A N/A Volume (cm) : 26.50% N/A N/A % Reduction in A rea: -120.50% N/A N/A % Reduction in Volume: 3 Position 1 (o'clock): 3 Maximum Distance 1 (cm): 3 Starting Position 1 (o'clock): 5 Ending Position 1 (o'clock): 4 Maximum Distance 1 (cm): Yes N/A N/A Tunneling:  Yes N/A N/A Undermining: Category/Stage III N/A N/A Classification: Medium N/A N/A Exudate A mount: Serosanguineous N/A N/A Exudate Type: red, brown N/A N/A Exudate Color: Distinct, outline attached N/A N/A Wound Margin: Medium (34-66%) N/A N/A Granulation Amount: Red N/A N/A Granulation Quality: Medium (34-66%) N/A N/A Necrotic Amount: Fat Layer (Subcutaneous Tissue): Yes N/A N/A Exposed Structures: Fascia: No Tendon: No Muscle: No Joint: No Bone: No Small (1-33%) N/A N/A Epithelialization: Debridement - Selective/Open Wound N/A N/A Debridement: Pre-procedure Verification/Time Out 13:20 N/A N/A Taken: Lidocaine 5% topical ointment N/A N/A Pain Control: Slough N/A N/A Tissue Debrided: Non-Viable Tissue N/A N/A Level: 9 N/A N/A Debridement A (sq cm): rea Curette N/A N/A Instrument: Minimum N/A N/A Bleeding: Pressure N/A N/A Hemostasis A chieved: 0 N/A N/A Procedural Pain: 0 N/A N/A Post Procedural Pain: Procedure was tolerated well N/A N/A Debridement Treatment Response: 3x3x0.3 N/A N/A Post Debridement Measurements L x W x D (cm) 2.121 N/A N/A Post Debridement Volume: (cm) Category/Stage III N/A N/A Post Debridement Stage: Debridement N/A N/A Procedures Performed: Treatment Notes Wound #1 (Elbow) Wound Laterality: Left Cleanser Soap and Water Discharge Instruction: May shower and  wash wound with dial antibacterial soap and water prior to dressing change. Wound Cleanser Discharge Instruction: Cleanse the wound with wound cleanser prior to applying a clean dressing using gauze sponges, not tissue or cotton balls. Peri-Wound Care Topical Primary Dressing KerraCel Ag Gelling Fiber Dressing, 4x5 in (silver alginate) Discharge Instruction: Apply silver alginate to wound bed as instructed Santyl Ointment Discharge Instruction: Apply nickel thick amount to wound bed as instructed Secondary Dressing ALLEVYN Gentle Border, 5x5 (in/in) Discharge Instruction: Apply over primary dressing as directed. ALLEVYN Heel 4 1/2in x 5 1/2in / 10.5cm x 13.5cm Discharge Instruction: Apply over primary dressing as directed. Secured With [employer], 4.5x3.1 (in/yd) Discharge Instruction: Secure with Kerlix as directed. 49M Medipore H Soft Cloth Surgical T ape, 4 x 10 (in/yd) Discharge Instruction: Secure with tape as directed. Compression Wrap Compression Stockings Add-Ons Electronic Signature(s) Signed: 04/07/2022 3:17:18 PM By: Fredirick Maudlin MD FACS Signed: 04/07/2022 4:54:12 PM By: Blanche East RN Entered By: Fredirick Maudlin on 04/07/2022 15:17:18 -------------------------------------------------------------------------------- Multi-Disciplinary Care Plan Details Patient Name: Date of Service: [name] ID M. 04/07/2022 12:30 PM Medical Record Number: [id number] Patient Account Number: [id number] Date of Birth/Sex: Treating RN: [birth year] (88 y.o. [name] Primary Care Jelene Albano: Abelino Derrick Other Clinician: Referring Mariusz Jubb: Treating Reizy Dunlow/Extender: Sandi Raveling in Treatment: 7 Active Inactive Abuse / Safety / Falls / Self Care Management Nursing Diagnoses: Impaired physical mobility Potential for falls Goals: Patient/caregiver will verbalize understanding of skin care regimen Date Initiated: 02/14/2022 Target  Resolution Date: 05/05/2022 Goal Status: Active Patient/caregiver will verbalize/demonstrate measures taken to prevent injury and/or falls Date Initiated: 02/14/2022 Target Resolution Date: 05/05/2022 Goal Status: Active Interventions: Assess fall risk on admission and as needed Assess self care needs on admission and as needed Notes: Wound/Skin Impairment Nursing Diagnoses: Impaired tissue integrity Knowledge deficit related to ulceration/compromised skin integrity Goals: Patient/caregiver will verbalize understanding of skin care regimen Date Initiated: 02/14/2022 Target Resolution Date: 05/05/2022 Goal Status: Active Ulcer/skin breakdown will have a volume reduction of 30% by week 4 Date Initiated: 02/14/2022 Target Resolution Date: 05/05/2022 Goal Status: Active Interventions: Assess patient/caregiver ability to obtain necessary supplies Assess patient/caregiver ability to perform ulcer/skin care regimen upon admission and as needed Assess ulceration(s) every visit Treatment Activities: Skin care regimen initiated : 02/14/2022 Topical wound management initiated : 02/14/2022 Notes: Electronic  Signature(s) Signed: 04/07/2022 4:54:12 PM By: Blanche East RN Entered By: Blanche East on 04/07/2022 13:36:43 -------------------------------------------------------------------------------- Pain Assessment Details Patient Name: Date of Service: [name] ID M. 04/07/2022 12:30 PM Medical Record Number: [id number] Patient Account Number: [id number] Date of Birth/Sex: Treating RN: 09/16/1932 (88 y.o. [name] Primary Care Mitsuye Schrodt: Abelino Derrick Other Clinician: Referring Nikolas Casher: Treating Leighla Chestnutt/Extender: Sandi Raveling in Treatment: 7 Active Problems Location of Pain Severity and Description of Pain Patient Has Paino No Site Locations Pain Management and Medication Current Pain Management: Electronic Signature(s) Signed: 04/07/2022  4:54:12 PM By: Blanche East RN Entered By: Blanche East on 04/07/2022 12:59:23 -------------------------------------------------------------------------------- Patient/Caregiver Education Details Patient Name: Date of Service: [name] ID M. 8/18/2023andnbsp12:30 PM Medical Record Number: [id number] Patient Account Number: [id number] Date of Birth/Gender: Treating RN: 04/12/1933 (89 y.o. [name] Primary Care Physician: Abelino Derrick Other Clinician: Referring Physician: Treating Physician/Extender: Sandi Raveling in Treatment: 7 Education Assessment Education Provided To: Patient Education Topics Provided Wound/Skin Impairment: Methods: Explain/Verbal Responses: Reinforcements needed, State content correctly Electronic Signature(s) Signed: 04/07/2022 4:54:12 PM By: Blanche East RN Entered By: Blanche East on 04/07/2022 13:36:56 -------------------------------------------------------------------------------- Wound Assessment Details Patient Name: Date of Service: [name] ID M. 04/07/2022 12:30 PM Medical Record Number: [id number] Patient Account Number: [id number] Date of Birth/Sex: Treating RN: 10/26/1932 (88 y.o. [name] Primary Care Aahna Rossa: Abelino Derrick Other Clinician: Referring Gayland Nicol: Treating Bobak Oguinn/Extender: Sandi Raveling in Treatment: 7 Wound Status Wound Number: 1 Primary Pressure Ulcer Etiology: Wound Location: Left Elbow Wound Status: Open Wounding Event: Pressure Injury Comorbid Cataracts, Arrhythmia, Coronary Artery Disease, Date Acquired: 12/19/2021 History: Hypotension, Osteoarthritis Weeks Of Treatment: 7 Clustered Wound: Yes Photos Wound Measurements Length: (cm) 3 % Re Width: (cm) 3 % Re Depth: (cm) 0.3 Epit Area: (cm) 7.069 Tun Volume: (cm) 2.121 M duction in Area: 26.5% duction in Volume: -120.5% helialization: Small (1-33%) neling: Yes Position  (o'clock): 3 aximum Distance: (cm) 3 Undermining: Yes Starting Position (o'clock): 3 Ending Position (o'clock): 5 Maximum Distance: (cm) 4 Wound Description Classification: Category/Stage III Fou Wound Margin: Distinct, outline attached Slo Exudate Amount: Medium Exudate Type: Serosanguineous Exudate Color: red, brown l Odor After Cleansing: No ugh/Fibrino Yes Wound Bed Granulation Amount: Medium (34-66%) Exposed Structure Granulation Quality: Red Fascia Exposed: No Necrotic Amount: Medium (34-66%) Fat Layer (Subcutaneous Tissue) Exposed: Yes Necrotic Quality: Adherent Slough Tendon Exposed: No Muscle Exposed: No Joint Exposed: No Bone Exposed: No Treatment Notes Wound #1 (Elbow) Wound Laterality: Left Cleanser Soap and Water Discharge Instruction: May shower and wash wound with dial antibacterial soap and water prior to dressing change. Wound Cleanser Discharge Instruction: Cleanse the wound with wound cleanser prior to applying a clean dressing using gauze sponges, not tissue or cotton balls. Peri-Wound Care Topical Primary Dressing KerraCel Ag Gelling Fiber Dressing, 4x5 in (silver alginate) Discharge Instruction: Apply silver alginate to wound bed as instructed Santyl Ointment Discharge Instruction: Apply nickel thick amount to wound bed as instructed Secondary Dressing ALLEVYN Gentle Border, 5x5 (in/in) Discharge Instruction: Apply over primary dressing as directed. ALLEVYN Heel 4 1/2in x 5 1/2in / 10.5cm x 13.5cm Discharge Instruction: Apply over primary dressing as directed. Secured With [employer], 4.5x3.1 (in/yd) Discharge Instruction: Secure with Kerlix as directed. 65M Medipore H Soft Cloth Surgical T ape, 4 x 10 (in/yd) Discharge Instruction: Secure with tape as directed. Compression Wrap Compression Stockings Add-Ons Electronic Signature(s) Signed: 04/07/2022 4:54:12 PM By:  Blanche East RN Signed: 04/07/2022 5:33:08 PM By: Dellie Catholic  RN Entered By: Dellie Catholic on 04/07/2022 13:05:09 -------------------------------------------------------------------------------- Vitals Details Patient Name: Date of Service: GO RDO Delane Ginger, DA V ID M. 04/07/2022 12:30 PM Medical Record Number: [id number] Patient Account Number: [id number] Date of Birth/Sex: Treating RN: Aug 25, 1932 (88 y.o. [name] Primary Care Pookela Sellin: Abelino Derrick Other Clinician: Referring Raneisha Bress: Treating Alecea Trego/Extender: Sandi Raveling in Treatment: 7 Vital Signs Time Taken: 12:59 Temperature (F): 97.6 Height (in): 66 Pulse (bpm): 73 Weight (lbs): 185 Respiratory Rate (breaths/min): 18 Body Mass Index (BMI): 29.9 Blood Pressure (mmHg): 125/73 Reference Range: 80 - 120 mg / dl Electronic Signature(s) Signed: 04/07/2022 4:54:12 PM By: Blanche East RN Entered By: Blanche East on 04/07/2022 12:59:17

## 2022-04-10 ENCOUNTER — Telehealth: Payer: Self-pay | Admitting: Family Medicine

## 2022-04-10 DIAGNOSIS — N3000 Acute cystitis without hematuria: Secondary | ICD-10-CM | POA: Diagnosis not present

## 2022-04-10 DIAGNOSIS — N3941 Urge incontinence: Secondary | ICD-10-CM | POA: Diagnosis not present

## 2022-04-10 DIAGNOSIS — R300 Dysuria: Secondary | ICD-10-CM | POA: Diagnosis not present

## 2022-04-10 NOTE — Progress Notes (Signed)
JAMALL, STROHMEIER ([id number]) Visit Report for 04/07/2022 Chief Complaint Document Details Patient Name: Date of Service: [name] ID M. 04/07/2022 12:30 PM Medical Record Number: [id number] Patient Account Number: [id number] Date of Birth/Sex: Treating RN: 01/21/1933 (88 y.o. [name] Primary Care Provider: Abelino Derrick Other Clinician: Referring Provider: Treating Provider/Extender: Sandi Raveling in Treatment: 7 Information Obtained from: Patient Chief Complaint 08/05/2019; patient is here for review of pressure ulcers on his buttock 02/14/22: patient is here for review of pressure ulcers on his left elbow Electronic Signature(s) Signed: 04/07/2022 3:17:29 PM By: Fredirick Maudlin MD FACS Entered By: Fredirick Maudlin on 04/07/2022 15:17:29 -------------------------------------------------------------------------------- Debridement Details Patient Name: Date of Service: GO Eddie Dibbles, DA V ID M. 04/07/2022 12:30 PM Medical Record Number: [id number] Patient Account Number: [id number] Date of Birth/Sex: Treating RN: [birth year] (88 y.o. [name] Primary Care Provider: Abelino Derrick Other Clinician: Referring Provider: Treating Provider/Extender: Sandi Raveling in Treatment: 7 Debridement Performed for Assessment: Wound #1 Left Elbow Performed By: Physician Fredirick Maudlin, MD Debridement Type: Debridement Level of Consciousness (Pre-procedure): Awake and Alert Pre-procedure Verification/Time Out Yes - 13:20 Taken: Start Time: 13:21 Pain Control: Lidocaine 5% topical ointment T Area Debrided (L x W): otal 3 (cm) x 3 (cm) = 9 (cm) Tissue and other material debrided: Non-Viable, Slough, Slough Level: Non-Viable Tissue Debridement Description: Selective/Open Wound Instrument: Curette Bleeding: Minimum Hemostasis Achieved: Pressure Procedural Pain: 0 Post Procedural Pain: 0 Response to Treatment: Procedure was  tolerated well Level of Consciousness (Post- Awake and Alert procedure): Post Debridement Measurements of Total Wound Length: (cm) 3 Stage: Category/Stage III Width: (cm) 3 Depth: (cm) 0.3 Volume: (cm) 2.121 Character of Wound/Ulcer Post Debridement: Requires Further Debridement Post Procedure Diagnosis Same as Pre-procedure Electronic Signature(s) Signed: 04/07/2022 4:05:21 PM By: Fredirick Maudlin MD FACS Signed: 04/07/2022 4:54:12 PM By: Blanche East RN Entered By: Blanche East on 04/07/2022 13:28:18 -------------------------------------------------------------------------------- HPI Details Patient Name: Date of Service: GO Eddie Dibbles, DA V ID M. 04/07/2022 12:30 PM Medical Record Number: [id number] Patient Account Number: [id number] Date of Birth/Sex: Treating RN: 04/20/1933 (88 y.o. [name] Primary Care Provider: Abelino Derrick Other Clinician: Referring Provider: Treating Provider/Extender: Sandi Raveling in Treatment: 7 History of Present Illness HPI Description: ADMISSION 08/05/19 This is an 88 year old man who arrives in clinic accompanied by his son. 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 Apparently noted recently to have a stage I pressure area on the left buttock. He does not have a wound history. He does have been air fluidized mattress. They have a wheelchair cushion as well as a pillow over the top of this. He is not incontinent of urine or bowel. Past medical history includes hypertension, osteoarthritis of the knee, atrial fibrillation, history of mitral valve repair, right hemisphere CVA with left hemiparesis, history of a DVT READMISSION 02/14/2022 The patient is here today for evaluation of pressure ulcers on his left elbow and possible right buttock ulcer. Apparently when he was seen by Dr. Dellia Nims in 2020, no ulcer was appreciated on the buttock and no further wound care involvement  occurred. 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 He does have home health aides and they are turning him every 2 hours side to side. He is on a memory foam mattress at  this time. 03/14/2022: The patient was scheduled to see me on July 11, but apparently his son panicked about some bright red blood per rectum and rushed him to the emergency room. There is also a comment in the electronic medical record that the patient's son thought the bone was exposed on his elbow. After waiting a prolonged period of time at the emergency department, they left without being seen. Today, it appears that the dressing on the patient's elbow was never changed since our last visit. The 2 wounds have converged creating a larger wound. According to the intake nurse, there was a foul odor from the wound and dressing. There is extensive slough on the wound surface. The bleeding per rectum turns out to be hemorrhoids. The patient's son expresses that he does not know how to manage this. 04/07/2022: For unclear reasons, the patient has not returned to clinic until they were contacted today and they made an add-on visit. The patient's son was not originally present at the beginning of the visit but showed up about 25 minutes into the visit. The patient was accompanied by a home aide. She reported that when she cleaned the patient up today, there was just a Band-Aid on his wound. She said the surface was fairly Vivien Rota but she was able to wash this off. It is abundantly clear that this wound is not being properly cared for. Apparently he did have [facility] home health but they have not been out in some time and it is not clear the reason for this. The surface of the wound is a bit cleaner so perhaps the Santyl has been applied, but unfortunately there is now a deep tunnel that extends up from the elbow to the  patient's posterior upper arm for a number centimeters. There is no evidence of infection or purulent drainage. It is also clear that the patient continues to spend a substantial portion of his time leaning on the elbow. Electronic Signature(s) Signed: 04/07/2022 3:21:04 PM By: Fredirick Maudlin MD FACS Entered By: Fredirick Maudlin on 04/07/2022 15:21:04 -------------------------------------------------------------------------------- Physical Exam Details Patient Name: Date of Service: [name] ID M. 04/07/2022 12:30 PM Medical Record Number: [id number] Patient Account Number: [id number] Date of Birth/Sex: Treating RN: Mar 26, 1933 (88 y.o. [name] Primary Care Provider: Abelino Derrick Other Clinician: Referring Provider: Treating Provider/Extender: Sandi Raveling in Treatment: 7 Constitutional . . . . No acute distress.Marland Kitchen Respiratory Normal work of breathing on room air.. Notes 04/07/2022: The wound surface is a little bit cleaner today with some granulation tissue forming, but there is a new tunnel that tracks posteriorly up the back of the patient's arm for several centimeters. No purulent drainage identified. No surrounding erythema or induration to suggest infection. Electronic Signature(s) Signed: 04/07/2022 3:36:46 PM By: Fredirick Maudlin MD FACS Entered By: Fredirick Maudlin on 04/07/2022 15:36:45 -------------------------------------------------------------------------------- Physician Orders Details Patient Name: Date of Service: [name] ID M. 04/07/2022 12:30 PM Medical Record Number: [id number] Patient Account Number: [id number] Date of Birth/Sex: Treating RN: [birth year] (88 y.o. [name] Primary Care Provider: Abelino Derrick Other Clinician: Referring Provider: Treating Provider/Extender: Sandi Raveling in Treatment: 7 Verbal / Phone Orders: No Diagnosis Coding ICD-10 Coding Code  Description [id number] Pressure ulcer of left elbow, stage 3 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 I48.19 Other persistent atrial fibrillation I25.10 Atherosclerotic heart disease of native coronary artery without angina pectoris Follow-up Appointments ppointment in 1 week. -  Dr. Celine Ahr - Room 4 Return A Tuesday 04/11/22 @ 3:30pm Bathing/ Shower/ Hygiene May shower and wash wound with soap and water. - with dressing changes Off-Loading Turn and reposition every 2 hours - stay off of left elbow wear prevalon boot on elbow as directed Home Health Wound #1 Left Elbow dmit to Home Health for wound care. May utilize formulary equivalent dressing for wound treatment orders unless otherwise specified. - A will follow up with [facility] Other Home Health Orders/Instructions: Wound Treatment Wound #1 - Elbow Wound Laterality: Left Cleanser: Soap and Water Every Other Day/10 Days Discharge Instructions: May shower and wash wound with dial antibacterial soap and water prior to dressing change. Cleanser: Wound Cleanser Every Other Day/10 Days Discharge Instructions: Cleanse the wound with wound cleanser prior to applying a clean dressing using gauze sponges, not tissue or cotton balls. Prim Dressing: KerraCel Ag Gelling Fiber Dressing, 4x5 in (silver alginate) (DME) (Dispense As Written) Every Other Day/10 Days ary Discharge Instructions: Apply silver alginate to wound bed as instructed Prim Dressing: Santyl Ointment Every Other Day/10 Days ary Discharge Instructions: Apply nickel thick amount to wound bed as instructed Secondary Dressing: ALLEVYN Gentle Border, 5x5 (in/in) (DME) (Dispense As Written) Every Other Day/10 Days Discharge Instructions: Apply over primary dressing as directed. Secondary Dressing: ALLEVYN Heel 4 1/2in x 5 1/2in / 10.5cm x 13.5cm (DME) (Dispense As Written) Every Other Day/10 Days Discharge Instructions: Apply over primary dressing  as directed. Secured With: [employer], 4.5x3.1 (in/yd) (DME) (Dispense As Written) Every Other Day/10 Days Discharge Instructions: Secure with Kerlix as directed. Secured With: 16M Medipore H Soft Cloth Surgical T ape, 4 x 10 (in/yd) (DME) (Dispense As Written) Every Other Day/10 Days Discharge Instructions: Secure with tape as directed. Add-Ons: Cotton Tip Applicator, 6 (in) (DME) (Generic) Every Other Day/10 Days Electronic Signature(s) Signed: 04/07/2022 4:54:12 PM By: Blanche East RN Signed: 04/10/2022 8:40:53 AM By: Fredirick Maudlin MD FACS Previous Signature: 04/07/2022 4:05:21 PM Version By: Fredirick Maudlin MD FACS Entered By: Blanche East on 04/07/2022 16:53:10 -------------------------------------------------------------------------------- Problem List Details Patient Name: Date of Service: [name] ID M. 04/07/2022 12:30 PM Medical Record Number: [id number] Patient Account Number: [id number] Date of Birth/Sex: Treating RN: 06-17-1933 (89 y.o. [name] Primary Care Provider: Abelino Derrick Other Clinician: Referring Provider: Treating Provider/Extender: Sandi Raveling in Treatment: 7 Active Problems ICD-10 Encounter Code Description Active Date MDM Diagnosis L89.023 Pressure ulcer of left elbow, stage 3 02/14/2022 No Yes 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 02/14/2022 No Yes I48.19 Other persistent atrial fibrillation 02/14/2022 No Yes I25.10 Atherosclerotic heart disease of native coronary artery without angina pectoris 02/14/2022 No Yes Inactive Problems Resolved Problems Electronic Signature(s) Signed: 04/07/2022 3:17:02 PM By: Fredirick Maudlin MD FACS Entered By: Fredirick Maudlin on 04/07/2022 15:17:02 -------------------------------------------------------------------------------- Progress Note Details Patient Name: Date of Service: [name] ID M. 04/07/2022 12:30 PM Medical  Record Number: [id number] Patient Account Number: [id number] Date of Birth/Sex: Treating RN: 05/24/1933 (89 y.o. [name] Primary Care Provider: Abelino Derrick Other Clinician: Referring Provider: Treating Provider/Extender: Sandi Raveling in Treatment: 7 Subjective Chief Complaint Information obtained from Patient 08/05/2019; patient is here for review of pressure ulcers on his buttock 02/14/22: patient is here for review of pressure ulcers on his left elbow History of Present Illness (HPI) ADMISSION 08/05/19 This is an 88 year old man who arrives in clinic accompanied by his son. Very disabled secondary to a right  hemisphere CVA with left hemiparesis in 2017. Apparently noted recently to have a stage I pressure area on the left buttock. He does not have a wound history. He does have been air fluidized mattress. They have a wheelchair cushion as well as a pillow over the top of this. He is not incontinent of urine or bowel. Past medical history includes hypertension, osteoarthritis of the knee, atrial fibrillation, history of mitral valve repair, right hemisphere CVA with left hemiparesis, history of a DVT READMISSION 02/14/2022 The patient is here today for evaluation of pressure ulcers on his left elbow and possible right buttock ulcer. Apparently when he was seen by Dr. Dellia Nims in 2020, no ulcer was appreciated on the buttock and no further wound care involvement occurred. 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 He does have home health aides and they are turning him every 2 hours side to side. He is on a memory foam mattress at this time. 03/14/2022: The patient was scheduled to see me on July 11, but apparently his son panicked about some bright red blood per rectum and rushed him to the emergency room. There is also a comment in  the electronic medical record that the patient's son thought the bone was exposed on his elbow. After waiting a prolonged period of time at the emergency department, they left without being seen. Today, it appears that the dressing on the patient's elbow was never changed since our last visit. The 2 wounds have converged creating a larger wound. According to the intake nurse, there was a foul odor from the wound and dressing. There is extensive slough on the wound surface. The bleeding per rectum turns out to be hemorrhoids. The patient's son expresses that he does not know how to manage this. 04/07/2022: For unclear reasons, the patient has not returned to clinic until they were contacted today and they made an add-on visit. The patient's son was not originally present at the beginning of the visit but showed up about 25 minutes into the visit. The patient was accompanied by a home aide. She reported that when she cleaned the patient up today, there was just a Band-Aid on his wound. She said the surface was fairly Vivien Rota but she was able to wash this off. It is abundantly clear that this wound is not being properly cared for. Apparently he did have [facility] home health but they have not been out in some time and it is not clear the reason for this. The surface of the wound is a bit cleaner so perhaps the Santyl has been applied, but unfortunately there is now a deep tunnel that extends up from the elbow to the patient's posterior upper arm for a number centimeters. There is no evidence of infection or purulent drainage. It is also clear that the patient continues to spend a substantial portion of his time leaning on the elbow. Patient History Information obtained from Patient. Family History Diabetes - Maternal Grandparents, Heart Disease - Mother, Hypertension - Mother, No family history of Cancer, Hereditary Spherocytosis, Kidney Disease, Lung Disease, Seizures, Stroke, Thyroid Problems,  Tuberculosis. Social History Never smoker, Marital Status - Married, Alcohol Use - Never, Drug Use - No History, Caffeine Use - Never. Medical History Eyes Patient has history of Cataracts - removed Cardiovascular Patient has history of Arrhythmia - Atrial Flutter/Atrial Fib, Coronary Artery Disease, Hypotension  Denies history of Hypertension Musculoskeletal Patient has history of Osteoarthritis Neurologic Denies history of Paraplegia Hospitalization/Surgery History - open heart surgery. - IR generic histoircal. - appendectomy. - hernia repair. - joint replacement. - mitral valve repair. - mvp repair. - total hip arthroplasty. Medical A Surgical History Notes nd Constitutional Symptoms (General Health) obseity Ear/Nose/Mouth/Throat wears hearing aids Cardiovascular Mitral Valve Repair, Genitourinary BPH Neurologic CVA 2018, left side hemiparesis Objective Constitutional No acute distress.. Vitals Time Taken: 12:59 PM, Height: 66 in, Weight: 185 lbs, BMI: 29.9, Temperature: 97.6 F, Pulse: 73 bpm, Respiratory Rate: 18 breaths/min, Blood Pressure: 125/73 mmHg. Respiratory Normal work of breathing on room air.. General Notes: 04/07/2022: The wound surface is a little bit cleaner today with some granulation tissue forming, but there is a new tunnel that tracks posteriorly up the back of the patient's arm for several centimeters. No purulent drainage identified. No surrounding erythema or induration to suggest infection. Integumentary (Hair, Skin) Wound #1 status is Open. Original cause of wound was Pressure Injury. The date acquired was: 12/19/2021. The wound has been in treatment 7 weeks. The wound is located on the Left Elbow. The wound measures 3cm length x 3cm width x 0.3cm depth; 7.069cm^2 area and 2.121cm^3 volume. There is Fat Layer (Subcutaneous Tissue) exposed. Tunneling has been noted at 3:00 with a maximum distance of 3cm. Undermining begins at 3:00 and ends at 5:00 with  a maximum distance of 4cm. There is a medium amount of serosanguineous drainage noted. The wound margin is distinct with the outline attached to the wound base. There is medium (34-66%) red granulation within the wound bed. There is a medium (34-66%) amount of necrotic tissue within the wound bed including Adherent Slough. 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 There was a Selective/Open Wound Non-Viable Tissue Debridement with a total area of 9 sq cm performed by Fredirick Maudlin, MD. With the following instrument(s): Curette to remove Non-Viable tissue/material. Material removed includes [facility] after achieving pain control using Lidocaine 5% topical ointment. No specimens were taken. A time out was conducted at 13:20, prior to the start of the procedure. A Minimum amount of bleeding was controlled with Pressure. The procedure was tolerated well with a pain level of 0 throughout and a pain level of 0 following the procedure. Post Debridement Measurements: 3cm length x 3cm width x 0.3cm depth; 2.121cm^3 volume. Post debridement Stage noted as Category/Stage III. Character of Wound/Ulcer Post Debridement requires further debridement. Post procedure Diagnosis Wound #1: Same as Pre-Procedure Plan Follow-up Appointments: Return Appointment in 1 week. - Dr. Celine Ahr - Room 4 Tuesday 04/11/22 @ 3:30pm Bathing/ Shower/ Hygiene: May shower and wash wound with soap and water. - with dressing changes Off-Loading: Turn and reposition every 2 hours - stay off of left elbow wear prevalon boot on elbow as directed Home Health: Wound #1 Left Elbow: Admit to Home Health for wound care.  May utilize formulary equivalent dressing for wound treatment orders unless otherwise specified. - will follow up with [facility] Other Home Health Orders/Instructions: WOUND #1: - Elbow Wound Laterality: Left Cleanser: Soap and Water Every Other Day/10 Days Discharge Instructions: May shower and wash wound with dial antibacterial soap and water prior to dressing change. Cleanser: Wound Cleanser Every Other Day/10  Days Discharge Instructions: Cleanse the wound with wound cleanser prior to applying a clean dressing using gauze sponges, not tissue or cotton balls. Prim Dressing: KerraCel Ag Gelling Fiber Dressing, 4x5 in (silver alginate) (DME) (Dispense As Written) Every Other Day/10 Days ary Discharge Instructions: Apply silver alginate to wound bed as instructed Prim Dressing: Santyl Ointment Every Other Day/10 Days ary Discharge Instructions: Apply nickel thick amount to wound bed as instructed Secondary Dressing: ALLEVYN Gentle Border, 5x5 (in/in) (DME) (Dispense As Written) Every Other Day/10 Days Discharge Instructions: Apply over primary dressing as directed. Secondary Dressing: ALLEVYN Heel 4 1/2in x 5 1/2in / 10.5cm x 13.5cm (DME) (Dispense As Written) Every Other Day/10 Days Discharge Instructions: Apply over primary dressing as directed. Secured With: [employer], 4.5x3.1 (in/yd) (DME) (Dispense As Written) Every Other Day/10 Days Discharge Instructions: Secure with Kerlix as directed. Secured With: 74M Medipore H Soft Cloth Surgical T ape, 4 x 10 (in/yd) (DME) (Dispense As Written) Every Other Day/10 Days Discharge Instructions: Secure with tape as directed. 04/07/2022: The wound surface is a little bit cleaner today with some granulation tissue forming, but there is a new tunnel that tracks posteriorly up the back of the patient's arm for several centimeters. No purulent drainage identified. No surrounding erythema or induration to suggest infection. I debrided slough from the  wound surface. We will need to pack the tunnel using iodoform gauze packing material. Continue silver alginate over the wound surface. Currently I do not see an indication for Santyl, so we will discontinue this. We will contact his home health agency to better understand why he is not receiving services and try to reinitiate it, as the patient's son is clearly not capable of providing the necessary care. We spoke with his home health aide regarding positioning him so that he is not leaning on that elbow and provided a Prevalon boot for him to further pad the elbow to avoid further trauma to the area. Follow-up in 1 week. Electronic Signature(s) Signed: 04/07/2022 3:41:09 PM By: Fredirick Maudlin MD FACS Entered By: Fredirick Maudlin on 04/07/2022 15:41:09 -------------------------------------------------------------------------------- HxROS Details Patient Name: Date of Service: GO RDO Delane Ginger, DA V ID M. 04/07/2022 12:30 PM Medical Record Number: [id number] Patient Account Number: [id number] Date of Birth/Sex: Treating RN: 1933-05-15 (88 y.o. [name] Primary Care Provider: Abelino Derrick Other Clinician: Referring Provider: Treating Provider/Extender: Sandi Raveling in Treatment: 7 Information Obtained From Patient Constitutional Symptoms (General Health) Medical History: Past Medical History Notes: obseity Eyes Medical History: Positive for: Cataracts - removed Ear/Nose/Mouth/Throat Medical History: Past Medical History Notes: wears hearing aids Cardiovascular Medical History: Positive for: Arrhythmia - Atrial Flutter/Atrial Fib; Coronary Artery Disease; Hypotension Negative for: Hypertension Past Medical History Notes: Mitral Valve Repair, Genitourinary Medical History: Past Medical History Notes: BPH Musculoskeletal Medical History: Positive for: Osteoarthritis Neurologic Medical History: Negative for: Paraplegia Past Medical History  Notes: CVA 2018, left side hemiparesis HBO Extended History Items Eyes: Cataracts Immunizations Pneumococcal Vaccine: Received Pneumococcal Vaccination: Yes Received Pneumococcal Vaccination On or After 60th Birthday: Yes Implantable Devices None Hospitalization / Surgery History Type of Hospitalization/Surgery open heart surgery IR generic histoircal appendectomy hernia repair joint replacement mitral valve repair mvp repair total hip arthroplasty Family and Social History Cancer: No; Diabetes: Yes - Maternal Grandparents; Heart Disease: Yes - Mother; Hereditary Spherocytosis: No; Hypertension: Yes - Mother; Kidney Disease: No; Lung Disease: No; Seizures: No; Stroke: No; Thyroid Problems: No; Tuberculosis: No; Never smoker; Marital Status - Married; Alcohol Use: Never; Drug Use:  No History; Caffeine Use: Never; Financial Concerns: No; Food, Clothing or Shelter Needs: No; Support System Lacking: No; Transportation Concerns: No Electronic Signature(s) Signed: 04/07/2022 4:05:21 PM By: Fredirick Maudlin MD FACS Signed: 04/07/2022 5:36:29 PM By: Baruch Gouty RN, BSN Entered By: Fredirick Maudlin on 04/07/2022 15:22:29 -------------------------------------------------------------------------------- SuperBill Details Patient Name: Date of Service: [name] ID M. 04/07/2022 Medical Record Number: [id number] Patient Account Number: [id number] Date of Birth/Sex: Treating RN: 05/31/1933 (88 y.o. 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 Other persistent atrial fibrillation I25.10 Atherosclerotic heart disease of native coronary artery without angina  pectoris Facility Procedures CPT4 Code: [id number] Description: 7597 - DEBRIDE WOUND 1ST 20 SQ CM OR < ICD-10 Diagnosis Description L89.023 Pressure ulcer of left elbow, stage 3 Modifier: Quantity: 1 Physician Procedures : CPT4 Code Description Modifier [id number] - WC PHYS LEVEL 3 - EST PT 25 ICD-10 Diagnosis Description L89.023 Pressure ulcer of left elbow, stage 3 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

## 2022-04-10 NOTE — Telephone Encounter (Signed)
Caller Name: Ammaar Encina Call back phone #: [phone]  Reason for Call: Pt's son believes he would benefit from Pregabaline'25mg'$ , please speak with son about options

## 2022-04-11 ENCOUNTER — Encounter: Payer: Self-pay | Admitting: Student in an Organized Health Care Education/Training Program

## 2022-04-11 ENCOUNTER — Encounter (HOSPITAL_BASED_OUTPATIENT_CLINIC_OR_DEPARTMENT_OTHER): Payer: Medicare Other | Admitting: General Surgery

## 2022-04-11 ENCOUNTER — Ambulatory Visit
Payer: Medicare Other | Attending: Student in an Organized Health Care Education/Training Program | Admitting: Student in an Organized Health Care Education/Training Program

## 2022-04-11 VITALS — BP 122/42 | HR 70 | Temp 97.2°F | Resp 16 | Ht 66.0 in | Wt 150.0 lb

## 2022-04-11 DIAGNOSIS — G8929 Other chronic pain: Secondary | ICD-10-CM | POA: Insufficient documentation

## 2022-04-11 DIAGNOSIS — Z0289 Encounter for other administrative examinations: Secondary | ICD-10-CM | POA: Insufficient documentation

## 2022-04-11 DIAGNOSIS — M5416 Radiculopathy, lumbar region: Secondary | ICD-10-CM | POA: Insufficient documentation

## 2022-04-11 DIAGNOSIS — M1711 Unilateral primary osteoarthritis, right knee: Secondary | ICD-10-CM | POA: Insufficient documentation

## 2022-04-11 DIAGNOSIS — M47816 Spondylosis without myelopathy or radiculopathy, lumbar region: Secondary | ICD-10-CM | POA: Diagnosis not present

## 2022-04-11 DIAGNOSIS — M5136 Other intervertebral disc degeneration, lumbar region: Secondary | ICD-10-CM | POA: Insufficient documentation

## 2022-04-11 DIAGNOSIS — G894 Chronic pain syndrome: Secondary | ICD-10-CM | POA: Insufficient documentation

## 2022-04-11 DIAGNOSIS — M1712 Unilateral primary osteoarthritis, left knee: Secondary | ICD-10-CM | POA: Insufficient documentation

## 2022-04-11 DIAGNOSIS — M51369 Other intervertebral disc degeneration, lumbar region without mention of lumbar back pain or lower extremity pain: Secondary | ICD-10-CM | POA: Insufficient documentation

## 2022-04-11 DIAGNOSIS — M170 Bilateral primary osteoarthritis of knee: Secondary | ICD-10-CM | POA: Insufficient documentation

## 2022-04-11 MED ORDER — OXYCODONE-ACETAMINOPHEN 7.5-325 MG PO TABS
1.0000 | ORAL_TABLET | Freq: Two times a day (BID) | ORAL | 0 refills | Status: DC | PRN
Start: 2022-05-11 — End: 2022-06-06

## 2022-04-11 MED ORDER — OXYCODONE-ACETAMINOPHEN 7.5-325 MG PO TABS: 1.0000 | ORAL_TABLET | Freq: Two times a day (BID) | ORAL | 0 refills | Status: AC | PRN

## 2022-04-11 MED ORDER — PREGABALIN 25 MG PO CAPS: 25.0000 mg | ORAL_CAPSULE | Freq: Every day | ORAL | 2 refills | Status: DC

## 2022-04-11 NOTE — Progress Notes (Signed)
Safety precautions to be maintained throughout the outpatient stay will include: orient to surroundings, keep bed in low position, maintain call bell within reach at all times, provide assistance with transfer out of bed and ambulation.  

## 2022-04-11 NOTE — Progress Notes (Signed)
PROVIDER NOTE: Information contained herein reflects review and annotations entered in association with encounter. Interpretation of such information and data should be left to medically-trained personnel. Information provided to patient can be located elsewhere in the medical record under "Patient Instructions". Document created using STT-dictation technology, any transcriptional errors that may result from process are unintentional.    Patient: Brian [name]  Service Category: E/M  Provider: Gillis Santa, MD  DOB: 09-Apr-1933  DOS: 04/11/2022  Referring Provider: Libby Maw  MRN: [id number]  Specialty: Interventional Pain Management  PCP: Libby Maw, MD  Type: Established Patient  Setting: Ambulatory outpatient    Location: Office  Delivery: Face-to-face     Primary Reason(s) for Visit: Encounter for evaluation before starting new chronic pain management plan of care (Level of risk: moderate) CC: Shoulder Pain (bilateral), Elbow Pain (bilateral), and Knee Pain (left)  HPI  Brian [name] is a 88 y.o. year old, male patient, who comes today for a follow-up evaluation to review the test results and decide on a treatment plan. He has OTHER AND UNSPECIFIED MITRAL VALVE DISEASES; Essential hypertension; Coronary atherosclerosis; ATRIAL FLUTTER; PEPTIC ULCER DISEASE; BENIGN PROSTATIC HYPERTROPHY; ROSACEA; ACTINIC KERATOSIS, HEAD; Osteoarthritis; ROTATOR CUFF SYNDROME, LEFT; EPISTAXIS, RECURRENT; UNS ADVRS EFF UNS RX MEDICINAL&BIOLOGICAL SBSTNC; History of peristent atrial fibrillation; DVT, HX OF; History of mitral valve repair; HIP REPLACEMENT, TOTAL, HX OF; APPENDECTOMY, HX OF; HERNIORRHAPHY, HX OF; OTH MALIG NEOPLASM SKIN OTH&UNSPEC PARTS FACE; Scoliosis (and kyphoscoliosis), idiopathic; Cerebrovascular accident (CVA) due to thrombosis of precerebral artery ([place]); Coronary artery disease involving native coronary artery of native heart without angina pectoris; Persistent atrial  fibrillation ([place]); Dysarthria, post-stroke; Dysphagia, post-stroke; Pain in joint of left shoulder; Arthralgia of hip; Chronic radicular lumbar pain; Osteoarthritis of hip; Osteoarthritis of left glenohumeral joint; Pain in left foot; Sacroiliitis ([place]); Anxiety; Mixed hyperlipidemia; Gait disturbance; Serum potassium elevated; Elevated glucose; Left ear pain; Pressure injury of right buttock, stage 1; Debilitated; Venous ulcer, limited to breakdown of skin ([place]); Swelling of lower extremity; Acute non-recurrent sinusitis; Need for influenza vaccination; Paroxysmal atrial fibrillation ([place]); Benign prostatic hyperplasia with urinary hesitancy; Obesity (BMI 30.0-34.9); Paraplegic immobility syndrome; Cellulitis of left upper extremity; Pressure injury of left elbow, stage 1; Other insomnia; Pressure injury of skin of sacral region; Pressure injury of left buttock, stage 2 ([place]); Noise effect on both inner ears; Presbycusis of both ears; History of cerebrovascular accident; Osteoarthritis of right knee; Depression with anxiety; Urinary retention; Pressure injury of left elbow, stage 2 ([place]); Syncope; Dysuria; GERD (gastroesophageal reflux disease); Pressure injury of skin; Lumbar spondylosis; Lumbar degenerative disc disease; Bilateral primary osteoarthritis of knee; Pain management contract signed; and Chronic pain syndrome on their problem list. His primarily concern today is the Shoulder Pain (bilateral), Elbow Pain (bilateral), and Knee Pain (left)  Pain Assessment: Location: Left Shoulder Radiating:   Onset: More than a month ago Duration: Chronic pain Quality: Discomfort Severity: 7 /10 (subjective, self-reported pain score)  Effect on ADL: Limits activities Timing: Constant Modifying factors: medication BP: (!) 122/42  HR: 70  Brian [name] comes in today for a follow-up visit after his initial evaluation on 12/27/2021. Today we went over the results of his tests. These were explained in "[place]'s  terms". During today's appointment we went over my diagnostic impression, as well as the proposed treatment plan.  No significant change in his medical history.  Patient unable to submit a urine toxicology screen so we obtained a serum drug screen. We will have patient sign pain contract.  He was accompanied today by his son and their CMA Prescription for Percocet as below 7.5 mg twice daily as needed which was being managed by their primary care provider, Dr. Ethelene Hal Also recommend restarting Lyrica which the patient was on before, 25 mg nightly as a pain adjunct.  HPI from initial clinic visit: Brian [name] is a pleasant 88 year old male who is accompanied today by his son and his nursing aide.  He has a longstanding history of diffuse arthritis most pronounced in his glenohumeral joints and knees.  He has tried physical therapy and continues to engage in participate in physical therapy twice a week.  He has a history of stroke with associated hemiparesis.  He has significant thoracic kyphosis.  He has difficulty ambulating and presents today in a wheelchair.  He also has bilateral hip osteoarthritis.  He was previously on Percocet 5 mg twice a day which became less effective and as a result, his primary care provider increased it to 7.5 mg twice a day which he is not getting any benefit from.  He is currently anticoagulated on Eliquis for his coronary artery disease.  He is also on Lyrica 25 mg twice a day.  He is also on Seroquel and Zoloft for depression management.    Controlled Substance Pharmacotherapy Assessment REMS (Risk Evaluation and Mitigation Strategy)  Opioid Analgesic: Hydrocodone 7.5 mg twice daily as needed #60/month  Pill Count: None expected due to no prior prescriptions written by our practice. Dewayne Shorter, RN  04/11/2022 12:59 PM  Sign when Signing Visit Safety precautions to be maintained throughout the outpatient stay will include: orient to surroundings, keep bed in low  position, maintain call bell within reach at all times, provide assistance with transfer out of bed and ambulation.   Pharmacokinetics: Liberation and absorption (onset of action): WNL Distribution (time to peak effect): WNL Metabolism and excretion (duration of action): WNL         Pharmacodynamics: Desired effects: Analgesia: Mr. Neece reports >50% benefit. Functional ability: Patient reports that medication allows him to accomplish basic ADLs Clinically meaningful improvement in function (CMIF): Sustained CMIF goals met Perceived effectiveness: Described as relatively effective, allowing for increase in activities of daily living (ADL) Undesirable effects: Side-effects or Adverse reactions: None reported Monitoring: [place] PMP: PDMP not reviewed this encounter. Online review of the past 26-monthperiod previously conducted. Not applicable at this point since we have not taken over the patient's medication management yet. List of other Serum/Urine Drug Screening Test(s):  No results found for: "AMPHSCRSER", "BARBSCRSER", "BENZOSCRSER", "COCAINSCRSER", "COCAINSCRNUR", "PCPSCRSER", "THCSCRSER", "THCU", "CANNABQUANT", "OPIATESCRSER", "OXYSCRSER", "PROPOXSCRSER", "ETH", "CBDTHCR", "D8THCCBX", "D9THCCBX" List of all UDS test(s) done:  Lab Results  Component Value Date   SUMMARY Note 12/27/2021   Last UDS on record: Summary  Date Value Ref Range Status  12/27/2021 Note  Final    Comment:    ==================================================================== Compliance Drug Analysis, Ur ==================================================================== Test                             Result       Flag       Units  Drug Present and Declared for Prescription Verification   Oxycodone                      2038         EXPECTED   ng/mg creat   Oxymorphone  6930         EXPECTED   ng/mg creat   Noroxycodone                   746          EXPECTED   ng/mg creat    Noroxymorphone                 488          EXPECTED   ng/mg creat    Sources of oxycodone are scheduled prescription medications.    Oxymorphone, noroxycodone, and noroxymorphone are expected    metabolites of oxycodone. Oxymorphone is also available as a    scheduled prescription medication.    Pregabalin                     PRESENT      EXPECTED   Sertraline                     PRESENT      EXPECTED   Desmethylsertraline            PRESENT      EXPECTED    Desmethylsertraline is an expected metabolite of sertraline.    Quetiapine                     PRESENT      EXPECTED   Acetaminophen                  PRESENT      EXPECTED  Drug Absent but Declared for Prescription Verification   Alprazolam                     Not Detected UNEXPECTED ng/mg creat   Metoprolol                     Not Detected UNEXPECTED ==================================================================== Test                      Result    Flag   Units      Ref Range   Creatinine              100              mg/dL      >=20 ==================================================================== Declared Medications:  The flagging and interpretation on this report are based on the  following declared medications.  Unexpected results may arise from  inaccuracies in the declared medications.   **Note: The testing scope of this panel includes these medications:   Alprazolam (Xanax)  Metoprolol  Oxycodone (Percocet)  Pregabalin (Lyrica)  Quetiapine (Seroquel)  Sertraline (Zoloft)   **Note: The testing scope of this panel does not include small to  moderate amounts of these reported medications:   Acetaminophen (Tylenol)  Acetaminophen (Percocet)   **Note: The testing scope of this panel does not include the  following reported medications:   Apixaban (Eliquis)  Cetirizine (Zyrtec)  Finasteride (Proscar)  Losartan (Cozaar)  Multivitamin  Pantoprazole (Protonix)  Polyethylene Glycol (MiraLAX)   Pravastatin  Tamsulosin (Flomax) ==================================================================== For clinical consultation, please call [phone]. ====================================================================    UDS interpretation: No unexpected findings.          Medication Assessment Form: Patient introduced to form today Treatment compliance: Treatment may start today if patient agrees with proposed plan. Evaluation of compliance is not applicable at this point Risk Assessment  Profile: Aberrant behavior: See initial evaluations. None observed or detected today Comorbid factors increasing risk of overdose: See initial evaluation. No additional risks detected today Opioid risk tool (ORT):     12/27/2021    1:55 PM  Opioid Risk   Alcohol 0  Illegal Drugs 0  Rx Drugs 0  Alcohol 0  Illegal Drugs 0  Rx Drugs 0  Age between 16-45 years  0  History of Preadolescent Sexual Abuse 0  Psychological Disease 0  Depression 0  Opioid Risk Tool Scoring 0  Opioid Risk Interpretation Low Risk    ORT Scoring interpretation table:  Score <3 = Low Risk for SUD  Score between 4-7 = Moderate Risk for SUD  Score >8 = High Risk for Opioid Abuse   Risk of substance use disorder (SUD): Low  Risk Mitigation Strategies:  Patient opioid safety counseling: Completed today. Counseling provided to patient as per "Patient Counseling Document". Document signed by patient, attesting to counseling and understanding Patient-Prescriber Agreement (PPA): Obtained today.  Controlled substance notification to other providers: Written and sent today.  Pharmacologic Plan: Today we may be taking over the patient's pharmacological regimen. See below.             Laboratory Chemistry Profile   Renal Lab Results  Component Value Date   BUN 18 03/14/2022   CREATININE 0.98 03/14/2022   BCR 19 02/06/2020   GFR 84.36 11/01/2021   GFRAA >60 11/03/2018   GFRNONAA >60 03/14/2022   SPECGRAV 1.020  07/21/2013   PHUR 5.5 07/21/2013   PROTEINUR 1+ (A) 09/27/2021     Electrolytes Lab Results  Component Value Date   NA 137 03/14/2022   K 3.7 03/14/2022   CL 104 03/14/2022   CALCIUM 8.8 (L) 03/14/2022   MG 1.7 03/15/2017   PHOS 2.4 (L) 05/07/2016     Hepatic Lab Results  Component Value Date   AST 25 03/14/2022   ALT 16 03/14/2022   ALBUMIN 3.2 (L) 03/14/2022   ALKPHOS 68 03/14/2022   LIPASE 23 03/14/2022     ID Lab Results  Component Value Date   HIV Non Reactive 03/13/2017   SARSCOV2NAA Not Detected 04/30/2019   MRSAPCR NEGATIVE 03/14/2017     Bone No results found for: "VD25OH", "VD125OH2TOT", "LH7342AJ6", "OT1572IO0", "35DHRCBU3", "25OHVITD2", "84TXMIWO0", "TESTOFREE", "TESTOSTERONE"   Endocrine Lab Results  Component Value Date   GLUCOSE 115 (H) 03/14/2022   GLUCOSEU NEGATIVE 12/07/2021   HGBA1C 5.1 12/07/2020   TSH 1.55 12/07/2020   FREET4 1.19 05/15/2011     Neuropathy Lab Results  Component Value Date   HGBA1C 5.1 12/07/2020   HIV Non Reactive 03/13/2017     CNS No results found for: "COLORCSF", "APPEARCSF", "RBCCOUNTCSF", "WBCCSF", "POLYSCSF", "LYMPHSCSF", "EOSCSF", "PROTEINCSF", "GLUCCSF", "JCVIRUS", "CSFOLI", "IGGCSF", "LABACHR", "ACETBL"   Inflammation (CRP: Acute  ESR: Chronic) Lab Results  Component Value Date   LATICACIDVEN 1.9 03/15/2022     Rheumatology No results found for: "RF", "ANA", "LABURIC", "URICUR", "LYMEIGGIGMAB", "LYMEABIGMQN", "HLAB27"   Coagulation Lab Results  Component Value Date   INR 1.4 (H) 03/14/2022   LABPROT 17.2 (H) 03/14/2022   APTT 35 05/04/2016   PLT 199 03/15/2022     Cardiovascular Lab Results  Component Value Date   BNP 158.2 (H) 03/14/2022   HGB 10.1 (L) 03/15/2022   HCT 32.5 (L) 03/15/2022     Screening Lab Results  Component Value Date   SARSCOV2NAA Not Detected 04/30/2019   MRSAPCR NEGATIVE 03/14/2017   HIV Non Reactive 03/13/2017  Cancer No results found for: "CEA", "CA125",  "LABCA2"   Allergens No results found for: "ALMOND", "APPLE", "ASPARAGUS", "AVOCADO", "BANANA", "BARLEY", "BASIL", "BAYLEAF", "GREENBEAN", "LIMABEAN", "WHITEBEAN", "BEEFIGE", "REDBEET", "BLUEBERRY", "BROCCOLI", "CABBAGE", "MELON", "CARROT", "CASEIN", "CASHEWNUT", "CAULIFLOWER", "CELERY"     Note: Lab results reviewed.  Recent Diagnostic Imaging Review  Cervical Imaging: Cervical MR wo contrast: No results found for this or any previous visit.  Cervical MR wo contrast: No valid procedures specified. Cervical CT wo contrast: Results for orders placed during the hospital encounter of 10/31/17  CT Cervical Spine Wo Contrast  Narrative CLINICAL DATA:  Fall with head impact  EXAM: CT HEAD WITHOUT CONTRAST  CT CERVICAL SPINE WITHOUT CONTRAST  TECHNIQUE: Multidetector CT imaging of the head and cervical spine was performed following the standard protocol without intravenous contrast. Multiplanar CT image reconstructions of the cervical spine were also generated.  COMPARISON:  Head CT and cervical spine CT 03/13/2017  FINDINGS: CT HEAD FINDINGS  Brain: No mass lesion, intraparenchymal hemorrhage or extra-axial collection. No evidence of acute cortical infarct. Old right MCA distribution infarct with associated encephalomalacia and ex vacuo dilatation of the right lateral ventricle. There is periventricular hypoattenuation compatible with chronic microvascular disease.  Vascular: No hyperdense vessel or unexpected calcification.  Skull: Normal visualized skull base, calvarium and extracranial soft tissues.  Sinuses/Orbits: Near complete opacification of the left maxillary sinus. Normal orbits.  CT CERVICAL SPINE FINDINGS  Alignment: No static subluxation. Facets are aligned. Occipital condyles are normally positioned.  Skull base and vertebrae: No acute fracture.  Soft tissues and spinal canal: No prevertebral fluid or swelling. No visible canal hematoma.  Disc  levels: Multilevel disc space narrowing and facet arthrosis. Multilevel neural foraminal narrowing, greatest at the C3-C6 levels.  Upper chest: No pneumothorax, pulmonary nodule or pleural effusion.  Other: Normal visualized paraspinal cervical soft tissues.  IMPRESSION: 1. No acute intracranial abnormality or skull fracture. 2. Old right MCA distribution infarct with associated encephalomalacia. 3. No acute fracture or static subluxation of the cervical spine. 4. Multilevel cervical degenerative disc disease and facet arthrosis with multilevel mid cervical neural foraminal narrowing.   Electronically Signed By: Ulyses Jarred M.D. On: 10/31/2017 06:55  DG Shoulder Right  Narrative CLINICAL DATA:  Status post fall, with right shoulder pain. Patient on Eliquis. Initial encounter.  EXAM: RIGHT SHOULDER - 2+ VIEW  COMPARISON:  Chest radiograph performed 03/27/2017  FINDINGS: There is no evidence of fracture or dislocation. Inferior osteophyte formation is noted at the medial humeral head. There is chronic cortical sclerosis at the humeral head. The right humeral head is seated within the glenoid fossa. Minimal degenerative change is noted at the right acromioclavicular joint. No significant soft tissue abnormalities are seen. The visualized portions of the right lung are clear.  IMPRESSION: No evidence of fracture or dislocation.   Electronically Signed By: Garald Balding M.D. On: 10/31/2017 06:35 DG Elbow Complete Left  Narrative CLINICAL DATA:  Left elbow pain after syncope with fall. Wound on the posterior elbow.  EXAM: LEFT ELBOW - COMPLETE 3+ VIEW  COMPARISON:  None Available.  FINDINGS: Diffuse bone demineralization. No evidence of acute fracture or dislocation of the left elbow. No focal bone lesion or bone destruction. No significant effusion. Soft tissue swelling over the posterior left elbow consistent with history of wound,  likely contusion.  IMPRESSION: Posterior soft tissue swelling.  No acute fracture or dislocation.   Electronically Signed By: Lucienne Capers M.D. On: 03/14/2022 19:05   Complexity Note: Imaging results reviewed.  Meds   Current Outpatient Medications:    ALPRAZolam (XANAX) 0.25 MG tablet, Take 0.25 mg by mouth 2 (two) times daily as needed for anxiety., Disp: , Rfl:    apixaban (ELIQUIS) 2.5 MG TABS tablet, TAKE 1 TABLET(2.5 MG) BY MOUTH TWICE DAILY (Patient taking differently: Take 2.5 mg by mouth 2 (two) times daily.), Disp: 180 tablet, Rfl: 1   cetirizine (ZYRTEC) 10 MG tablet, TAKE 1 TABLET(10 MG) BY MOUTH AT BEDTIME (Patient taking differently: Take 10 mg by mouth at bedtime as needed for allergies.), Disp: 90 tablet, Rfl: 2   finasteride (PROSCAR) 5 MG tablet, TAKE 1 TABLET(5 MG) BY MOUTH DAILY (Patient taking differently: Take 5 mg by mouth daily.), Disp: 90 tablet, Rfl: 0   fluticasone (FLONASE) 50 MCG/ACT nasal spray, Place 1 spray into both nostrils daily as needed for allergies., Disp: , Rfl:    hydrocortisone ointment 0.5 %, Apply 1 Application topically daily as needed for itching., Disp: , Rfl:    melatonin 5 MG TABS, Take 5 mg by mouth at bedtime as needed (sleep)., Disp: , Rfl:    metoprolol succinate (TOPROL-XL) 25 MG 24 hr tablet, TAKE 1 TABLET(25 MG) BY MOUTH DAILY (Patient taking differently: Take 25 mg by mouth daily.), Disp: 90 tablet, Rfl: 3   Multiple Vitamin (MULTIVITAMIN) tablet, Take 1 tablet by mouth 2 (two) times daily., Disp: , Rfl:    pantoprazole (PROTONIX) 40 MG tablet, TAKE 1 TABLET(40 MG) BY MOUTH DAILY (Patient taking differently: Take 40 mg by mouth daily.), Disp: 90 tablet, Rfl: 0   polyethylene glycol powder (GLYCOLAX/MIRALAX) 17 GM/SCOOP powder, Mix one scoop with water and take daily until stools loosen. May repeat as needed. (Patient taking differently: Take 17 g by mouth daily as needed for mild constipation.), Disp: 850  g, Rfl: 1   pravastatin (PRAVACHOL) 40 MG tablet, TAKE 1 TABLET(40 MG) BY MOUTH DAILY (Patient taking differently: Take 40 mg by mouth daily.), Disp: 90 tablet, Rfl: 3   pregabalin (LYRICA) 25 MG capsule, Take 1 capsule (25 mg total) by mouth at bedtime., Disp: 30 capsule, Rfl: 2   QUEtiapine (SEROQUEL) 50 MG tablet, TAKE 1 TABLET(50 MG) BY MOUTH AT BEDTIME (Patient taking differently: Take 50 mg by mouth at bedtime.), Disp: 30 tablet, Rfl: 2   tamsulosin (FLOMAX) 0.4 MG CAPS capsule, Take 1 capsule (0.4 mg total) by mouth daily after supper., Disp: 90 capsule, Rfl: 3   MUPIROCIN EX, Apply 1 Application topically daily as needed (wound care)., Disp: , Rfl:    oxyCODONE-acetaminophen (PERCOCET) 7.5-325 MG tablet, Take 1 tablet by mouth every 12 (twelve) hours as needed for severe pain., Disp: 60 tablet, Rfl: 0   [START ON 05/11/2022] oxyCODONE-acetaminophen (PERCOCET) 7.5-325 MG tablet, Take 1 tablet by mouth every 12 (twelve) hours as needed for severe pain., Disp: 60 tablet, Rfl: 0   sertraline (ZOLOFT) 50 MG tablet, TAKE 1 TABLET(50 MG) BY MOUTH EVERY MORNING (Patient taking differently: Take 50 mg by mouth daily.), Disp: 90 tablet, Rfl: 3  ROS  Constitutional: Denies any fever or chills Gastrointestinal: No reported hemesis, hematochezia, vomiting, or acute GI distress Musculoskeletal: Denies any acute onset joint swelling, redness, loss of ROM, or weakness Neurological: No reported episodes of acute onset apraxia, aphasia, dysarthria, agnosia, amnesia, paralysis, loss of coordination, or loss of consciousness  Allergies  Mr. Brendle is allergic to novocain [procaine hcl], other, and penicillins.  [place]  Drug: Mr. Hannum  reports no history of drug use. Alcohol:  reports no history of  alcohol use. Tobacco:  reports that he has quit smoking. He has never used smokeless tobacco. Medical:  has a past medical history of Anemia, Atrial flutter ([place]), BPH (benign prostatic hypertrophy), Epistaxis,  Hemorrhage of gastrointestinal tract, unspecified, History of open heart surgery, Hypertension, Obesity (BMI 30.0-34.9) (06/29/2020), Osteoarthrosis, unspecified whether generalized or localized, unspecified site, Personal history of venous thrombosis and embolism, Rosacea, Stroke ([place]), and TIA (transient ischemic attack). Surgical: Mr. Gravlin  has a past surgical history that includes Appendectomy; Hernia repair; Total hip arthroplasty; Joint replacement; mvp repair; Mitral valve repair; Radiology with anesthesia (N/A, 05/04/2016); ir generic historical (05/04/2016); ir generic historical (05/04/2016); and ir generic historical (06/07/2016). Family: family history includes Coronary artery disease in his father; Rheumatic fever in his mother.  Constitutional Exam  General appearance: Well nourished, well developed, and well hydrated. In no apparent acute distress Vitals:   04/11/22 1248  BP: (!) 122/42  Pulse: 70  Resp: 16  Temp: (!) 97.2 F (36.2 C)  SpO2: 96%  Weight: 150 lb (68 kg)  Height: _0  (1.676 m)   BMI Assessment: Estimated body mass index is 24.21 kg/m as calculated from the following:   Height as of this encounter: _1  (1.676 m).   Weight as of this encounter: 150 lb (68 kg).  BMI interpretation table: BMI level Category Range association with higher incidence of chronic pain  <18 kg/m2 Underweight   18.5-24.9 kg/m2 Ideal body weight   25-29.9 kg/m2 Overweight Increased incidence by 20%  30-34.9 kg/m2 Obese (Class I) Increased incidence by 68%  35-39.9 kg/m2 Severe obesity (Class II) Increased incidence by 136%  >40 kg/m2 Extreme obesity (Class III) Increased incidence by 254%   Patient's current BMI Ideal Body weight  Body mass index is 24.21 kg/m. Ideal body weight: 63.8 kg (140 lb 10.5 oz) Adjusted ideal body weight: 65.5 kg (144 lb 6.3 oz)   BMI Readings from Last 4 Encounters:  04/11/22 24.21 kg/m  03/14/22 28.13 kg/m  02/28/22 28.13 kg/m  01/31/22 28.74  kg/m   Wt Readings from Last 4 Encounters:  04/11/22 150 lb (68 kg)  03/14/22 185 lb (83.9 kg)  02/28/22 185 lb (83.9 kg)  01/31/22 189 lb (85.7 kg)    Psych/Mental status: Alert, oriented x 3 (person, place, & time)       Eyes: PERLA Respiratory: No evidence of acute respiratory distress  Patient is severely deconditioned, severe thoracic kyphosis and scoliosis.  Presents in wheelchair. Thickened deconditioning.  Assessment & Plan  Primary Diagnosis & Pertinent Problem List: The primary encounter diagnosis was Chronic radicular lumbar pain. Diagnoses of Lumbar spondylosis, Lumbar degenerative disc disease, Bilateral primary osteoarthritis of knee, Pain management contract signed, Primary osteoarthritis of right knee, Primary osteoarthritis of left knee, and Chronic pain syndrome were also pertinent to this visit.  Visit Diagnosis: 1. Chronic radicular lumbar pain   2. Lumbar spondylosis   3. Lumbar degenerative disc disease   4. Bilateral primary osteoarthritis of knee   5. Pain management contract signed   6. Primary osteoarthritis of right knee   7. Primary osteoarthritis of left knee   8. Chronic pain syndrome    Problems updated and reviewed during this visit: Problem  Lumbar Spondylosis  Lumbar Degenerative Disc Disease  Bilateral Primary Osteoarthritis of Knee  Pain Management Contract Signed  Chronic Pain Syndrome  Chronic Radicular Lumbar Pain    Plan of Care  Pharmacotherapy (Medications Ordered): Meds ordered this encounter  Medications   oxyCODONE-acetaminophen (PERCOCET) 7.5-325 MG tablet  Sig: Take 1 tablet by mouth every 12 (twelve) hours as needed for severe pain.    Dispense:  60 tablet    Refill:  0   oxyCODONE-acetaminophen (PERCOCET) 7.5-325 MG tablet    Sig: Take 1 tablet by mouth every 12 (twelve) hours as needed for severe pain.    Dispense:  60 tablet    Refill:  0   pregabalin (LYRICA) 25 MG capsule    Sig: Take 1 capsule (25 mg total)  by mouth at bedtime.    Dispense:  30 capsule    Refill:  2    Lab Orders         Drug Screen 10 W/Conf, Serum       Provider-requested follow-up: Return in about 8 weeks (around 06/06/2022) for Medication Management, in person. Recent Visits No visits were found meeting these conditions. Showing recent visits within past 90 days and meeting all other requirements Today's Visits Date Type Provider Dept  04/11/22 Office Visit Gillis Santa, MD [facility]  Showing today's visits and meeting all other requirements Future Appointments Date Type Provider Dept  05/30/22 Appointment Gillis Santa, MD [facility]  Showing future appointments within next 90 days and meeting all other requirements  Primary Care Physician: Libby Maw, MD Note by: Gillis Santa, MD Date: 04/11/2022; Time: 3:25 PM

## 2022-04-11 NOTE — Telephone Encounter (Signed)
Have spoke with patients daughter regarding this medication.

## 2022-04-11 NOTE — Progress Notes (Deleted)
Patient: Brian [name]  Service Category: E/M  Provider: Gillis Santa, MD  DOB: 05/25/1933  DOS: 04/11/2022  Referring Provider: Libby Maw  MRN: [id number]  Setting: Ambulatory outpatient  PCP: Libby Maw, MD  Type: New Patient  Specialty: Interventional Pain Management    Location: Office  Delivery: Face-to-face     Primary Reason(s) for Visit: Encounter for initial evaluation of one or more chronic problems (new to examiner) potentially causing chronic pain, and posing a threat to normal musculoskeletal function. (Level of risk: High) CC: No chief complaint on file.  HPI  Brian [name] is a 88 y.o. year old, male patient, who comes for the first time to our practice referred by Libby Maw,* for our initial evaluation of his chronic pain. He has OTHER AND UNSPECIFIED MITRAL VALVE DISEASES; Essential hypertension; Coronary atherosclerosis; ATRIAL FLUTTER; PEPTIC ULCER DISEASE; BENIGN PROSTATIC HYPERTROPHY; ROSACEA; ACTINIC KERATOSIS, HEAD; Osteoarthritis; ROTATOR CUFF SYNDROME, LEFT; EPISTAXIS, RECURRENT; UNS ADVRS EFF UNS RX MEDICINAL&BIOLOGICAL SBSTNC; History of peristent atrial fibrillation; DVT, HX OF; History of mitral valve repair; HIP REPLACEMENT, TOTAL, HX OF; APPENDECTOMY, HX OF; HERNIORRHAPHY, HX OF; OTH MALIG NEOPLASM SKIN OTH&UNSPEC PARTS FACE; Scoliosis (and kyphoscoliosis), idiopathic; Cerebrovascular accident (CVA) due to thrombosis of precerebral artery ([place]); Coronary artery disease involving native coronary artery of native heart without angina pectoris; Persistent atrial fibrillation ([place]); Dysarthria, post-stroke; Dysphagia, post-stroke; Pain in joint of left shoulder; Arthralgia of hip; Lumbar radiculopathy; Osteoarthritis of hip; Osteoarthritis of left glenohumeral joint; Pain in left foot; Sacroiliitis ([place]); Anxiety; Mixed hyperlipidemia; Gait disturbance; Serum potassium elevated; Elevated glucose; Left ear pain; Pressure injury of right buttock,  stage 1; Debilitated; Venous ulcer, limited to breakdown of skin ([place]); Swelling of lower extremity; Acute non-recurrent sinusitis; Need for influenza vaccination; Paroxysmal atrial fibrillation ([place]); Benign prostatic hyperplasia with urinary hesitancy; Obesity (BMI 30.0-34.9); Paraplegic immobility syndrome; Cellulitis of left upper extremity; Pressure injury of left elbow, stage 1; Other insomnia; Pressure injury of skin of sacral region; Pressure injury of left buttock, stage 2 ([place]); Noise effect on both inner ears; Presbycusis of both ears; History of cerebrovascular accident; Osteoarthritis of right knee; Depression with anxiety; Urinary retention; Pressure injury of left elbow, stage 2 ([place]); Syncope; Dysuria; GERD (gastroesophageal reflux disease); and Pressure injury of skin on their problem list. Today he comes in for evaluation of his No chief complaint on file.  Pain Assessment: Location:     Radiating:   Onset:   Duration:   Quality:   Severity:  /10 (subjective, self-reported pain score)  Effect on ADL:   Timing:   Modifying factors:   BP:    HR:    Onset and Duration: {Hx; Onset and Duration:210120511} Cause of pain: {Hx; Cause:210120521} Severity: {Pain Severity:210120502} Timing: {Symptoms; Timing:210120501} Aggravating Factors: {Causes; Aggravating pain factors:210120507} Alleviating Factors: {Causes; Alleviating Factors:210120500} Associated Problems: {Hx; Associated problems:210120515} Quality of Pain: {Hx; Symptom quality or Descriptor:210120531} Previous Examinations or Tests: {Hx; Previous examinations or test:210120529} Previous Treatments: {Hx; Previous Treatment:210120503}  ***  Today I took the time to provide the patient with information regarding my pain practice. The patient was informed that my practice is divided into two sections: an interventional pain management section, as well as a completely separate and distinct medication management section. I  explained that I have procedure days for my interventional therapies, and evaluation days for follow-ups and medication management. Because of the amount of documentation required during both, they are kept separated. This means that there is the possibility that he may  be scheduled for a procedure on one day, and medication management the next. I have also informed him that because of staffing and facility limitations, I no longer take patients for medication management only. To illustrate the reasons for this, I gave the patient the example of surgeons, and how inappropriate it would be to refer a patient to his/her care, just to write for the post-surgical antibiotics on a surgery done by a different surgeon.   Because interventional pain management is my board-certified specialty, the patient was informed that joining my practice means that they are open to any and all interventional therapies. I made it clear that this does not mean that they will be forced to have any procedures done. What this means is that I believe interventional therapies to be essential part of the diagnosis and proper management of chronic pain conditions. Therefore, patients not interested in these interventional alternatives will be better served under the care of a different practitioner.  The patient was also made aware of my Comprehensive Pain Management Safety Guidelines where by joining my practice, they limit all of their nerve blocks and joint injections to those done by our practice, for as long as we are retained to manage their care.   Historic Controlled Substance Pharmacotherapy Review  PMP and historical list of controlled substances: ***  Current opioid analgesics:   *** MME/day: *** mg/day  Historical Monitoring: The patient  reports no history of drug use. List of prior UDS Testing: No results found for: "MDMA", "COCAINSCRNUR", "PCPSCRNUR", "PCPQUANT", "CANNABQUANT", "THCU", "ETH", "CBDTHCR", "D8THCCBX",  "D9THCCBX" Historical Background Evaluation: [place] PMP: PDMP not reviewed this encounter. Review of the past 34-month conducted.             PMP NARX Score Report:  Narcotic: *** Sedative: *** Stimulant: *** [place] Department of public safety, offender search: (Editor, commissioningInformation) Non-contributory Risk Assessment Profile: Aberrant behavior: None observed or detected today Risk factors for fatal opioid overdose: None identified today PMP NARX Overdose Risk Score: *** Fatal overdose hazard ratio (HR): Calculation deferred Non-fatal overdose hazard ratio (HR): Calculation deferred Risk of opioid abuse or dependence: 0.7-3.0% with doses ? 36 MME/day and 6.1-26% with doses ? 120 MME/day. Substance use disorder (SUD) risk level: See below Personal History of Substance Abuse (SUD-Substance use disorder):  Alcohol:    Illegal Drugs:    Rx Drugs:    ORT Risk Level calculation:    ORT Scoring interpretation table:  Score <3 = Low Risk for SUD  Score between 4-7 = Moderate Risk for SUD  Score >8 = High Risk for Opioid Abuse   PHQ-2 Depression Scale:  Total score:    PHQ-2 Scoring interpretation table: (Score and probability of major depressive disorder)  Score 0 = No depression  Score 1 = 15.4% Probability  Score 2 = 21.1% Probability  Score 3 = 38.4% Probability  Score 4 = 45.5% Probability  Score 5 = 56.4% Probability  Score 6 = 78.6% Probability   PHQ-9 Depression Scale:  Total score:    PHQ-9 Scoring interpretation table:  Score 0-4 = No depression  Score 5-9 = Mild depression  Score 10-14 = Moderate depression  Score 15-19 = Moderately severe depression  Score 20-27 = Severe depression (2.4 times higher risk of SUD and 2.89 times higher risk of overuse)   Pharmacologic Plan: As per protocol, I have not taken over any controlled substance management, pending the results of ordered tests and/or consults.  Initial impression: Pending review of available data and ordered  tests.  Meds   Current Outpatient Medications:    ALPRAZolam (XANAX) 0.25 MG tablet, Take 0.25 mg by mouth 2 (two) times daily as needed for anxiety., Disp: , Rfl:    apixaban (ELIQUIS) 2.5 MG TABS tablet, TAKE 1 TABLET(2.5 MG) BY MOUTH TWICE DAILY (Patient taking differently: Take 2.5 mg by mouth 2 (two) times daily.), Disp: 180 tablet, Rfl: 1   cetirizine (ZYRTEC) 10 MG tablet, TAKE 1 TABLET(10 MG) BY MOUTH AT BEDTIME (Patient taking differently: Take 10 mg by mouth at bedtime as needed for allergies.), Disp: 90 tablet, Rfl: 2   finasteride (PROSCAR) 5 MG tablet, TAKE 1 TABLET(5 MG) BY MOUTH DAILY (Patient taking differently: Take 5 mg by mouth daily.), Disp: 90 tablet, Rfl: 0   fluticasone (FLONASE) 50 MCG/ACT nasal spray, Place 1 spray into both nostrils daily as needed for allergies., Disp: , Rfl:    hydrocortisone ointment 0.5 %, Apply 1 Application topically daily as needed for itching., Disp: , Rfl:    melatonin 5 MG TABS, Take 5 mg by mouth at bedtime as needed (sleep)., Disp: , Rfl:    metoprolol succinate (TOPROL-XL) 25 MG 24 hr tablet, TAKE 1 TABLET(25 MG) BY MOUTH DAILY (Patient taking differently: Take 25 mg by mouth daily.), Disp: 90 tablet, Rfl: 3   Multiple Vitamin (MULTIVITAMIN) tablet, Take 1 tablet by mouth 2 (two) times daily., Disp: , Rfl:    MUPIROCIN EX, Apply 1 Application topically daily as needed (wound care)., Disp: , Rfl:    oxyCODONE-acetaminophen (PERCOCET) 7.5-325 MG tablet, Take 1 tablet by mouth every 8 (eight) hours as needed for severe pain., Disp: 90 tablet, Rfl: 0   pantoprazole (PROTONIX) 40 MG tablet, TAKE 1 TABLET(40 MG) BY MOUTH DAILY (Patient taking differently: Take 40 mg by mouth daily.), Disp: 90 tablet, Rfl: 0   polyethylene glycol powder (GLYCOLAX/MIRALAX) 17 GM/SCOOP powder, Mix one scoop with water and take daily until stools loosen. May repeat as needed. (Patient taking differently: Take 17 g by mouth daily as needed for mild constipation.), Disp:  850 g, Rfl: 1   pravastatin (PRAVACHOL) 40 MG tablet, TAKE 1 TABLET(40 MG) BY MOUTH DAILY (Patient taking differently: Take 40 mg by mouth daily.), Disp: 90 tablet, Rfl: 3   QUEtiapine (SEROQUEL) 50 MG tablet, TAKE 1 TABLET(50 MG) BY MOUTH AT BEDTIME (Patient taking differently: Take 50 mg by mouth at bedtime.), Disp: 30 tablet, Rfl: 2   sertraline (ZOLOFT) 50 MG tablet, TAKE 1 TABLET(50 MG) BY MOUTH EVERY MORNING (Patient taking differently: Take 50 mg by mouth daily.), Disp: 90 tablet, Rfl: 3   tamsulosin (FLOMAX) 0.4 MG CAPS capsule, Take 1 capsule (0.4 mg total) by mouth daily after supper., Disp: 90 capsule, Rfl: 3  Imaging Review  Cervical Imaging: Cervical MR wo contrast: No results found for this or any previous visit.  Cervical MR wo contrast: No valid procedures specified. Cervical MR w/wo contrast: No results found for this or any previous visit.  Cervical MR w contrast: No results found for this or any previous visit.  Cervical CT wo contrast: Results for orders placed during the hospital encounter of 10/31/17  CT Cervical Spine Wo Contrast  Narrative CLINICAL DATA:  Fall with head impact  EXAM: CT HEAD WITHOUT CONTRAST  CT CERVICAL SPINE WITHOUT CONTRAST  TECHNIQUE: Multidetector CT imaging of the head and cervical spine was performed following the standard protocol without intravenous contrast. Multiplanar CT image reconstructions of the cervical  spine were also generated.  COMPARISON:  Head CT and cervical spine CT 03/13/2017  FINDINGS: CT HEAD FINDINGS  Brain: No mass lesion, intraparenchymal hemorrhage or extra-axial collection. No evidence of acute cortical infarct. Old right MCA distribution infarct with associated encephalomalacia and ex vacuo dilatation of the right lateral ventricle. There is periventricular hypoattenuation compatible with chronic microvascular disease.  Vascular: No hyperdense vessel or unexpected calcification.  Skull: Normal  visualized skull base, calvarium and extracranial soft tissues.  Sinuses/Orbits: Near complete opacification of the left maxillary sinus. Normal orbits.  CT CERVICAL SPINE FINDINGS  Alignment: No static subluxation. Facets are aligned. Occipital condyles are normally positioned.  Skull base and vertebrae: No acute fracture.  Soft tissues and spinal canal: No prevertebral fluid or swelling. No visible canal hematoma.  Disc levels: Multilevel disc space narrowing and facet arthrosis. Multilevel neural foraminal narrowing, greatest at the C3-C6 levels.  Upper chest: No pneumothorax, pulmonary nodule or pleural effusion.  Other: Normal visualized paraspinal cervical soft tissues.  IMPRESSION: 1. No acute intracranial abnormality or skull fracture. 2. Old right MCA distribution infarct with associated encephalomalacia. 3. No acute fracture or static subluxation of the cervical spine. 4. Multilevel cervical degenerative disc disease and facet arthrosis with multilevel mid cervical neural foraminal narrowing.   Electronically Signed By: Ulyses Jarred M.D. On: 10/31/2017 06:55  Cervical CT w/wo contrast: No results found for this or any previous visit.  Cervical CT w/wo contrast: No results found for this or any previous visit.  Cervical CT w contrast: No results found for this or any previous visit.  Cervical CT outside: No results found for this or any previous visit.  Cervical DG 1 view: No results found for this or any previous visit.  Cervical DG 2-3 views: No results found for this or any previous visit.  Cervical DG F/E views: No results found for this or any previous visit.  Cervical DG 2-3 clearing views: No results found for this or any previous visit.  Cervical DG Bending/F/E views: No results found for this or any previous visit.  Cervical DG complete: No results found for this or any previous visit.  Cervical DG Myelogram views: No results found for this  or any previous visit.  Cervical DG Myelogram views: No results found for this or any previous visit.  Cervical Discogram views: No results found for this or any previous visit.   Shoulder Imaging: Shoulder-R MR w contrast: No results found for this or any previous visit.  Shoulder-L MR w contrast: No results found for this or any previous visit.  Shoulder-R MR w/wo contrast: No results found for this or any previous visit.  Shoulder-L MR w/wo contrast: No results found for this or any previous visit.  Shoulder-R MR wo contrast: No results found for this or any previous visit.  Shoulder-L MR wo contrast: No results found for this or any previous visit.  Shoulder-R CT w contrast: No results found for this or any previous visit.  Shoulder-L CT w contrast: No results found for this or any previous visit.  Shoulder-R CT w/wo contrast: No results found for this or any previous visit.  Shoulder-L CT w/wo contrast: No results found for this or any previous visit.  Shoulder-R CT wo contrast: No results found for this or any previous visit.  Shoulder-L CT wo contrast: No results found for this or any previous visit.  Shoulder-R DG Arthrogram: No results found for this or any previous visit.  Shoulder-L DG Arthrogram: No results  found for this or any previous visit.  Shoulder-R DG 1 view: No results found for this or any previous visit.  Shoulder-L DG 1 view: No results found for this or any previous visit.  Shoulder-R DG: Results for orders placed during the hospital encounter of 10/31/17  DG Shoulder Right  Narrative CLINICAL DATA:  Status post fall, with right shoulder pain. Patient on Eliquis. Initial encounter.  EXAM: RIGHT SHOULDER - 2+ VIEW  COMPARISON:  Chest radiograph performed 03/27/2017  FINDINGS: There is no evidence of fracture or dislocation. Inferior osteophyte formation is noted at the medial humeral head. There is chronic cortical sclerosis at the  humeral head. The right humeral head is seated within the glenoid fossa. Minimal degenerative change is noted at the right acromioclavicular joint. No significant soft tissue abnormalities are seen. The visualized portions of the right lung are clear.  IMPRESSION: No evidence of fracture or dislocation.   Electronically Signed By: Garald Balding M.D. On: 10/31/2017 06:35  Shoulder-L DG: No results found for this or any previous visit.   Thoracic Imaging: Thoracic MR wo contrast: No results found for this or any previous visit.  Thoracic MR wo contrast: No valid procedures specified. Thoracic MR w/wo contrast: No results found for this or any previous visit.  Thoracic MR w contrast: No results found for this or any previous visit.  Thoracic CT wo contrast: No results found for this or any previous visit.  Thoracic CT w/wo contrast: No results found for this or any previous visit.  Thoracic CT w/wo contrast: No results found for this or any previous visit.  Thoracic CT w contrast: No results found for this or any previous visit.  Thoracic DG 2-3 views: No results found for this or any previous visit.  Thoracic DG 4 views: No results found for this or any previous visit.  Thoracic DG: No results found for this or any previous visit.  Thoracic DG w/swimmers view: No results found for this or any previous visit.  Thoracic DG Myelogram views: No results found for this or any previous visit.  Thoracic DG Myelogram views: No results found for this or any previous visit.   Lumbosacral Imaging: Lumbar MR wo contrast: No results found for this or any previous visit.  Lumbar MR wo contrast: No valid procedures specified. Lumbar MR w/wo contrast: No results found for this or any previous visit.  Lumbar MR w/wo contrast: No results found for this or any previous visit.  Lumbar MR w contrast: No results found for this or any previous visit.  Lumbar CT wo contrast: No results  found for this or any previous visit.  Lumbar CT w/wo contrast: No results found for this or any previous visit.  Lumbar CT w/wo contrast: No results found for this or any previous visit.  Lumbar CT w contrast: No results found for this or any previous visit.  Lumbar DG 1V: No results found for this or any previous visit.  Lumbar DG 1V (Clearing): No results found for this or any previous visit.  Lumbar DG 2-3V (Clearing): No results found for this or any previous visit.  Lumbar DG 2-3 views: No results found for this or any previous visit.  Lumbar DG (Complete) 4+V: No results found for this or any previous visit.        Lumbar DG F/E views: No results found for this or any previous visit.        Lumbar DG Bending views: No results found for  this or any previous visit.        Lumbar DG Myelogram views: No results found for this or any previous visit.  Lumbar DG Myelogram: No results found for this or any previous visit.  Lumbar DG Myelogram: No results found for this or any previous visit.  Lumbar DG Myelogram: No results found for this or any previous visit.  Lumbar DG Myelogram Lumbosacral: No results found for this or any previous visit.  Lumbar DG Diskogram views: No results found for this or any previous visit.  Lumbar DG Diskogram views: No results found for this or any previous visit.  Lumbar DG Epidurogram OP: No results found for this or any previous visit.  Lumbar DG Epidurogram IP: No valid procedures specified.  Sacroiliac Joint Imaging: Sacroiliac Joint DG: No results found for this or any previous visit.  Sacroiliac Joint MR w/wo contrast: No results found for this or any previous visit.  Sacroiliac Joint MR wo contrast: No results found for this or any previous visit.   Spine Imaging: Whole Spine DG Myelogram views: No results found for this or any previous visit.  Whole Spine MR Mets screen: No results found for this or any previous visit.  Whole  Spine MR Mets screen: No results found for this or any previous visit.  Whole Spine MR w/wo: No results found for this or any previous visit.  MRA Spinal Canal w/ cm: No results found for this or any previous visit.  MRA Spinal Canal wo/ cm: No valid procedures specified. MRA Spinal Canal w/wo cm: No results found for this or any previous visit.  Spine Outside MR Films: No results found for this or any previous visit.  Spine Outside CT Films: No results found for this or any previous visit.  CT-Guided Biopsy: No results found for this or any previous visit.  CT-Guided Needle Placement: No results found for this or any previous visit.  DG Spine outside: No results found for this or any previous visit.  IR Spine outside: No results found for this or any previous visit.  NM Spine outside: No results found for this or any previous visit.   Hip Imaging: Hip-R MR w contrast: No results found for this or any previous visit.  Hip-L MR w contrast: No results found for this or any previous visit.  Hip-R MR w/wo contrast: No results found for this or any previous visit.  Hip-L MR w/wo contrast: No results found for this or any previous visit.  Hip-R MR wo contrast: No results found for this or any previous visit.  Hip-L MR wo contrast: No results found for this or any previous visit.  Hip-R CT w contrast: No results found for this or any previous visit.  Hip-L CT w contrast: No results found for this or any previous visit.  Hip-R CT w/wo contrast: No results found for this or any previous visit.  Hip-L CT w/wo contrast: No results found for this or any previous visit.  Hip-R CT wo contrast: No results found for this or any previous visit.  Hip-L CT wo contrast: No results found for this or any previous visit.  Hip-R DG 2-3 views: No results found for this or any previous visit.  Hip-L DG 2-3 views: No results found for this or any previous visit.  Hip-R DG Arthrogram: No  results found for this or any previous visit.  Hip-L DG Arthrogram: No results found for this or any previous visit.  Hip-B DG Bilateral: No  results found for this or any previous visit.   Knee Imaging: Knee-R MR w contrast: No results found for this or any previous visit.  Knee-L MR w/o contrast: No results found for this or any previous visit.  Knee-R MR w/wo contrast: No results found for this or any previous visit.  Knee-L MR w/wo contrast: No results found for this or any previous visit.  Knee-R MR wo contrast: No results found for this or any previous visit.  Knee-L MR wo contrast: No results found for this or any previous visit.  Knee-R CT w contrast: No results found for this or any previous visit.  Knee-L CT w contrast: No results found for this or any previous visit.  Knee-R CT w/wo contrast: No results found for this or any previous visit.  Knee-L CT w/wo contrast: No results found for this or any previous visit.  Knee-R CT wo contrast: No results found for this or any previous visit.  Knee-L CT wo contrast: No results found for this or any previous visit.  Knee-R DG 1-2 views: No results found for this or any previous visit.  Knee-L DG 1-2 views: Results for orders placed in visit on 10/14/12  DG Knee 1-2 Views Left  Narrative *RADIOLOGY REPORT*  Clinical Data: Left knee pain.  No injury.  LEFT KNEE - 1-2 VIEW  Comparison: None.  Findings: Tricompartmental degenerative changes in the left knee. Medial greater than lateral compartment narrowing.  Prominent osteophytosis.  Chondrocalcinosis seen in the lateral compartment. No significant effusion.  No focal bone lesion or bone destruction. No evidence of acute fracture or subluxation.  No radiopaque soft tissue foreign bodies.  Vascular calcifications.  IMPRESSION: Tricompartmental degenerative changes in the left knee.   Original Report Authenticated By: Lucienne Capers, M.D.  Knee-R DG 3 views:  No results found for this or any previous visit.  Knee-L DG 3 views: No results found for this or any previous visit.  Knee-R DG 4 views: No results found for this or any previous visit.  Knee-L DG 4 views: No results found for this or any previous visit.  Knee-R DG Arthrogram: No results found for this or any previous visit.  Knee-L DG Arthrogram: No results found for this or any previous visit.   Ankle Imaging: Ankle-R DG Complete: No results found for this or any previous visit.  Ankle-L DG Complete: No results found for this or any previous visit.   Foot Imaging: Foot-R DG Complete: No results found for this or any previous visit.  Foot-L DG Complete: No results found for this or any previous visit.   Elbow Imaging: Elbow-R DG Complete: No results found for this or any previous visit.  Elbow-L DG Complete: Results for orders placed during the hospital encounter of 03/14/22  DG Elbow Complete Left  Narrative CLINICAL DATA:  Left elbow pain after syncope with fall. Wound on the posterior elbow.  EXAM: LEFT ELBOW - COMPLETE 3+ VIEW  COMPARISON:  None Available.  FINDINGS: Diffuse bone demineralization. No evidence of acute fracture or dislocation of the left elbow. No focal bone lesion or bone destruction. No significant effusion. Soft tissue swelling over the posterior left elbow consistent with history of wound, likely contusion.  IMPRESSION: Posterior soft tissue swelling.  No acute fracture or dislocation.   Electronically Signed By: Lucienne Capers M.D. On: 03/14/2022 19:05   Wrist Imaging: Wrist-R DG Complete: No results found for this or any previous visit.  Wrist-L DG Complete: No results found for  this or any previous visit.   Hand Imaging: Hand-R DG Complete: No results found for this or any previous visit.  Hand-L DG Complete: No results found for this or any previous visit.   Complexity Note: Imaging results reviewed.                          ROS  Cardiovascular: {Hx; Cardiovascular History:210120525} Pulmonary or Respiratory: {Hx; Pumonary and/or Respiratory History:210120523} Neurological: {Hx; Neurological:210120504} Psychological-Psychiatric: {Hx; Psychological-Psychiatric History:210120512} Gastrointestinal: {Hx; Gastrointestinal:210120527} Genitourinary: {Hx; Genitourinary:210120506} Hematological: {Hx; Hematological:210120510} Endocrine: {Hx; Endocrine history:210120509} Rheumatologic: {Hx; Rheumatological:210120530} Musculoskeletal: {Hx; Musculoskeletal:210120528} Work History: {Hx; Work history:210120514}  Allergies  Mr. Delvecchio is allergic to novocain [procaine hcl], other, and penicillins.  Laboratory Chemistry Profile   Renal Lab Results  Component Value Date   BUN 18 03/14/2022   CREATININE 0.98 03/14/2022   BCR 19 02/06/2020   GFR 84.36 11/01/2021   GFRAA >60 11/03/2018   GFRNONAA >60 03/14/2022   SPECGRAV 1.020 07/21/2013   PHUR 5.5 07/21/2013   PROTEINUR 1+ (A) 09/27/2021     Electrolytes Lab Results  Component Value Date   NA 137 03/14/2022   K 3.7 03/14/2022   CL 104 03/14/2022   CALCIUM 8.8 (L) 03/14/2022   MG 1.7 03/15/2017   PHOS 2.4 (L) 05/07/2016     Hepatic Lab Results  Component Value Date   AST 25 03/14/2022   ALT 16 03/14/2022   ALBUMIN 3.2 (L) 03/14/2022   ALKPHOS 68 03/14/2022   LIPASE 23 03/14/2022     ID Lab Results  Component Value Date   HIV Non Reactive 03/13/2017   SARSCOV2NAA Not Detected 04/30/2019   MRSAPCR NEGATIVE 03/14/2017     Bone No results found for: "VD25OH", "VD125OH2TOT", "JX9147WG9", "FA2130QM5", "25OHVITD1", "25OHVITD2", "25OHVITD3", "TESTOFREE", "TESTOSTERONE"   Endocrine Lab Results  Component Value Date   GLUCOSE 115 (H) 03/14/2022   GLUCOSEU NEGATIVE 12/07/2021   HGBA1C 5.1 12/07/2020   TSH 1.55 12/07/2020   FREET4 1.19 05/15/2011     Neuropathy Lab Results  Component Value Date   HGBA1C 5.1 12/07/2020   HIV Non  Reactive 03/13/2017     CNS No results found for: "COLORCSF", "APPEARCSF", "RBCCOUNTCSF", "WBCCSF", "POLYSCSF", "LYMPHSCSF", "EOSCSF", "PROTEINCSF", "GLUCCSF", "JCVIRUS", "CSFOLI", "IGGCSF", "LABACHR", "ACETBL"   Inflammation (CRP: Acute  ESR: Chronic) Lab Results  Component Value Date   LATICACIDVEN 1.9 03/15/2022     Rheumatology No results found for: "RF", "ANA", "LABURIC", "URICUR", "LYMEIGGIGMAB", "LYMEABIGMQN", "HLAB27"   Coagulation Lab Results  Component Value Date   INR 1.4 (H) 03/14/2022   LABPROT 17.2 (H) 03/14/2022   APTT 35 05/04/2016   PLT 199 03/15/2022     Cardiovascular Lab Results  Component Value Date   BNP 158.2 (H) 03/14/2022   HGB 10.1 (L) 03/15/2022   HCT 32.5 (L) 03/15/2022     Screening Lab Results  Component Value Date   SARSCOV2NAA Not Detected 04/30/2019   MRSAPCR NEGATIVE 03/14/2017   HIV Non Reactive 03/13/2017     Cancer No results found for: "CEA", "CA125", "LABCA2"   Allergens No results found for: "ALMOND", "APPLE", "ASPARAGUS", "AVOCADO", "BANANA", "BARLEY", "BASIL", "BAYLEAF", "GREENBEAN", "LIMABEAN", "WHITEBEAN", "BEEFIGE", "REDBEET", "BLUEBERRY", "BROCCOLI", "CABBAGE", "MELON", "CARROT", "CASEIN", "CASHEWNUT", "CAULIFLOWER", "CELERY"     Note: Lab results reviewed.  [place]  Drug: Mr. Trauger  reports no history of drug use. Alcohol:  reports no history of alcohol use. Tobacco:  reports that he has quit smoking. He has never used smokeless tobacco. Medical:  has a past medical history of Anemia, Atrial flutter ([place]), BPH (benign prostatic hypertrophy), Epistaxis, Hemorrhage of gastrointestinal tract, unspecified, History of open heart surgery, Hypertension, Obesity (BMI 30.0-34.9) (06/29/2020), Osteoarthrosis, unspecified whether generalized or localized, unspecified site, Personal history of venous thrombosis and embolism, Rosacea, Stroke ([place]), and TIA (transient ischemic attack). Family: family history includes Coronary artery  disease in his father; Rheumatic fever in his mother.  Past Surgical History:  Procedure Laterality Date   APPENDECTOMY     HERNIA REPAIR     IR GENERIC HISTORICAL  05/04/2016   IR PERCUTANEOUS ART THROMBECTOMY/INFUSION INTRACRANIAL INC DIAG ANGIO 05/04/2016 [name], MD [facility]   IR GENERIC HISTORICAL  05/04/2016   IR ANGIO VERTEBRAL SEL SUBCLAVIAN INNOMINATE UNI R MOD SED 05/04/2016 [name], MD [facility]   IR GENERIC HISTORICAL  06/07/2016   IR RADIOLOGIST EVAL & MGMT 06/07/2016 [facility]   JOINT REPLACEMENT     MITRAL VALVE REPAIR     mvp repair     RADIOLOGY WITH ANESTHESIA N/A 05/04/2016   Procedure: RADIOLOGY WITH ANESTHESIA;  Surgeon: [name], MD;  Location: [facility];  Service: Radiology;  Laterality: N/A;   TOTAL HIP ARTHROPLASTY     Active Ambulatory Problems    Diagnosis Date Noted   OTHER AND UNSPECIFIED MITRAL VALVE DISEASES 03/12/2008   Essential hypertension 03/06/2007   Coronary atherosclerosis 02/13/2007   ATRIAL FLUTTER 03/12/2008   PEPTIC ULCER DISEASE 03/06/2007   BENIGN PROSTATIC HYPERTROPHY 03/06/2007   ROSACEA 02/13/2007   ACTINIC KERATOSIS, HEAD 09/06/2009   Osteoarthritis 02/20/2009   ROTATOR CUFF SYNDROME, LEFT 03/07/2010   EPISTAXIS, RECURRENT 11/19/2008   UNS ADVRS EFF UNS RX MEDICINAL&BIOLOGICAL SBSTNC 06/12/2008   History of peristent atrial fibrillation 02/13/2007   DVT, HX OF 02/13/2007   History of mitral valve repair 02/20/2009   HIP REPLACEMENT, TOTAL, HX OF 02/20/2009   APPENDECTOMY, HX OF 02/20/2009   HERNIORRHAPHY, HX OF 02/20/2009   OTH MALIG NEOPLASM SKIN OTH&UNSPEC PARTS FACE 12/06/2009   Scoliosis (and kyphoscoliosis), idiopathic 01/26/2014   Cerebrovascular accident (CVA) due to thrombosis of precerebral artery ([place])    Coronary artery disease involving native coronary artery of native heart without angina pectoris    Persistent atrial fibrillation (HCC)    Dysarthria, post-stroke    Dysphagia,  post-stroke    Pain in joint of left shoulder 12/28/2017   Arthralgia of hip 02/07/2012   Lumbar radiculopathy 02/14/2018   Osteoarthritis of hip 02/07/2012   Osteoarthritis of left glenohumeral joint 12/28/2017   Pain in left foot 10/23/2017   Sacroiliitis ([place]) 02/07/2012   Anxiety 04/30/2018   Mixed hyperlipidemia 09/24/2018   Gait disturbance 09/24/2018   Serum potassium elevated 03/18/2019   Elevated glucose 04/08/2019   Left ear pain 05/20/2019   Pressure injury of right buttock, stage 1 05/20/2019   Debilitated 05/20/2019   Venous ulcer, limited to breakdown of skin ([place]) 02/06/2020   Swelling of lower extremity 02/06/2020   Acute non-recurrent sinusitis 05/11/2020   Need for influenza vaccination 05/11/2020   Paroxysmal atrial fibrillation ([place]) 05/11/2020   Benign prostatic hyperplasia with urinary hesitancy 05/11/2020   Obesity (BMI 30.0-34.9) 06/29/2020   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) 03/29/2021   Noise effect on both inner ears 08/12/2019  Presbycusis of both ears 08/12/2019   History of cerebrovascular accident 10/18/2017   Osteoarthritis of right knee 03/25/2019   Depression with anxiety 11/01/2021   Urinary retention 12/06/2021   Pressure injury of left elbow, stage 2 ([place]) 01/31/2022   Syncope 03/15/2022   Dysuria 03/15/2022   GERD (gastroesophageal reflux disease) 03/15/2022   Pressure injury of skin 03/15/2022   Resolved Ambulatory Problems    Diagnosis Date Noted   SYNCOPE, HX OF 02/20/2009   CVA (cerebral infarction) 05/04/2016   Acute CVA (cerebrovascular accident) ([place])    Stroke (cerebrum) (HCC)    Arterial hypotension    Hypocalcemia    Acute respiratory failure (HCC)    Cerebral infarction due to  thrombosis of precerebral artery (HCC)    Cerebrovascular accident (CVA) due to thrombosis of right middle cerebral artery (HCC)    Benign essential HTN    TIA (transient ischemic attack)    H/O mitral valve repair    Tachypnea    Hypokalemia    Acute blood loss anemia    Thrombocytopenia ([place])    Dysphagia    CAP (community acquired pneumonia) 03/13/2017   Depression, recurrent ([place]) 08/02/2021   Past Medical History:  Diagnosis Date   Anemia    BPH (benign prostatic hypertrophy)    Hemorrhage of gastrointestinal tract, unspecified    History of open heart surgery    Hypertension    Osteoarthrosis, unspecified whether generalized or localized, unspecified site    Stroke [facility])    Constitutional Exam  General appearance: Well nourished, well developed, and well hydrated. In no apparent acute distress There were no vitals filed for this visit. BMI Assessment: Estimated body mass index is 28.13 kg/m as calculated from the following:   Height as of 03/14/22: '5\' 8"'  (1.727 m).   Weight as of 03/14/22: 185 lb (83.9 kg).  BMI interpretation table: BMI level Category Range association with higher incidence of chronic pain  <18 kg/m2 Underweight   18.5-24.9 kg/m2 Ideal body weight   25-29.9 kg/m2 Overweight Increased incidence by 20%  30-34.9 kg/m2 Obese (Class I) Increased incidence by 68%  35-39.9 kg/m2 Severe obesity (Class II) Increased incidence by 136%  >40 kg/m2 Extreme obesity (Class III) Increased incidence by 254%   Patient's current BMI Ideal Body weight  There is no height or weight on file to calculate BMI. Patient weight not recorded   BMI Readings from Last 4 Encounters:  03/14/22 28.13 kg/m  02/28/22 28.13 kg/m  01/31/22 28.74 kg/m  12/27/21 28.89 kg/m   Wt Readings from Last 4 Encounters:  03/14/22 185 lb (83.9 kg)  02/28/22 185 lb (83.9 kg)  01/31/22 189 lb (85.7 kg)  12/27/21 190 lb (86.2 kg)    Psych/Mental status: Alert, oriented x 3 (person,  place, & time)       Eyes: PERLA Respiratory: No evidence of acute respiratory distress  Assessment  Primary Diagnosis & Pertinent Problem List: There were no encounter diagnoses.  Visit Diagnosis (New problems to examiner): No diagnosis found. Plan of Care (Initial workup plan)  Note: Mr. Low was reminded that as per protocol, today's visit has been an evaluation only. We have not taken over the patient's controlled substance management.  Problem-specific plan: No problem-specific Assessment & Plan notes found for this encounter.  Lab Orders  No laboratory test(s) ordered today   Imaging Orders  No imaging studies ordered today   Referral Orders  No referral(s) requested today   Procedure Orders    No procedure(s)  ordered today   Pharmacotherapy (current): Medications ordered:  No orders of the defined types were placed in this encounter.  Medications administered during this visit: [name] had no medications administered during this visit.   Pharmacological management options:  Opioid Analgesics: The patient was informed that there is no guarantee that he would be a candidate for opioid analgesics. The decision will be made following CDC guidelines. This decision will be based on the results of diagnostic studies, as well as Mr. Yom risk profile.   Membrane stabilizer: To be determined at a later time  Muscle relaxant: To be determined at a later time  NSAID: To be determined at a later time  Other analgesic(s): To be determined at a later time   Interventional management options: Mr. Bischof was informed that there is no guarantee that he would be a candidate for interventional therapies. The decision will be based on the results of diagnostic studies, as well as Mr. Dorwart risk profile.  Procedure(s) under consideration:  Pending results of ordered studies      Interventional Therapies  Risk  Complexity Considerations:   Estimated body mass  index is 28.13 kg/m as calculated from the following:   Height as of 03/14/22: '5\' 8"'  (1.727 m).   Weight as of 03/14/22: 185 lb (83.9 kg). WNL   Planned  Pending:   Pending further evaluation   Under consideration:   ***   Completed:   None at this time   Completed by other providers:   None at this time   Therapeutic  Palliative (PRN) options:   None established      Provider-requested follow-up: No follow-ups on file.  Future Appointments  Date Time Provider [place]  04/11/2022 12:40 PM Gillis Santa, MD [facility] None  06/22/2022  3:45 PM [facility] PHARMACIST [facility]    Note by: Gillis Santa, MD Date: 04/11/2022; Time: 9:26 AM

## 2022-04-13 DIAGNOSIS — L89023 Pressure ulcer of left elbow, stage 3: Secondary | ICD-10-CM | POA: Diagnosis not present

## 2022-04-13 DIAGNOSIS — L89323 Pressure ulcer of left buttock, stage 3: Secondary | ICD-10-CM | POA: Diagnosis not present

## 2022-04-13 DIAGNOSIS — I2510 Atherosclerotic heart disease of native coronary artery without angina pectoris: Secondary | ICD-10-CM | POA: Diagnosis not present

## 2022-04-13 DIAGNOSIS — Z7901 Long term (current) use of anticoagulants: Secondary | ICD-10-CM | POA: Diagnosis not present

## 2022-04-13 DIAGNOSIS — I69354 Hemiplegia and hemiparesis following cerebral infarction affecting left non-dominant side: Secondary | ICD-10-CM | POA: Diagnosis not present

## 2022-04-13 DIAGNOSIS — I4819 Other persistent atrial fibrillation: Secondary | ICD-10-CM | POA: Diagnosis not present

## 2022-04-15 LAB — DRUG SCREEN 10 W/CONF, SERUM
Amphetamines, IA: NEGATIVE ng/mL
Barbiturates, IA: NEGATIVE ug/mL
Benzodiazepines, IA: NEGATIVE ng/mL
Cocaine & Metabolite, IA: NEGATIVE ng/mL
Methadone, IA: NEGATIVE ng/mL
Opiates, IA: NEGATIVE ng/mL
Phencyclidine, IA: NEGATIVE ng/mL
Propoxyphene, IA: NEGATIVE ng/mL
THC(Marijuana) Metabolite, IA: NEGATIVE ng/mL

## 2022-04-15 LAB — OXYCODONES,MS,WB/SP RFX

## 2022-04-18 DIAGNOSIS — I69354 Hemiplegia and hemiparesis following cerebral infarction affecting left non-dominant side: Secondary | ICD-10-CM | POA: Diagnosis not present

## 2022-04-18 DIAGNOSIS — L89323 Pressure ulcer of left buttock, stage 3: Secondary | ICD-10-CM | POA: Diagnosis not present

## 2022-04-18 DIAGNOSIS — I4819 Other persistent atrial fibrillation: Secondary | ICD-10-CM | POA: Diagnosis not present

## 2022-04-18 DIAGNOSIS — L89023 Pressure ulcer of left elbow, stage 3: Secondary | ICD-10-CM | POA: Diagnosis not present

## 2022-04-18 DIAGNOSIS — Z7901 Long term (current) use of anticoagulants: Secondary | ICD-10-CM | POA: Diagnosis not present

## 2022-04-18 DIAGNOSIS — I2510 Atherosclerotic heart disease of native coronary artery without angina pectoris: Secondary | ICD-10-CM | POA: Diagnosis not present

## 2022-04-26 DIAGNOSIS — L89323 Pressure ulcer of left buttock, stage 3: Secondary | ICD-10-CM | POA: Diagnosis not present

## 2022-04-26 DIAGNOSIS — Z7901 Long term (current) use of anticoagulants: Secondary | ICD-10-CM | POA: Diagnosis not present

## 2022-04-26 DIAGNOSIS — I4819 Other persistent atrial fibrillation: Secondary | ICD-10-CM | POA: Diagnosis not present

## 2022-04-26 DIAGNOSIS — I2510 Atherosclerotic heart disease of native coronary artery without angina pectoris: Secondary | ICD-10-CM | POA: Diagnosis not present

## 2022-04-26 DIAGNOSIS — I69354 Hemiplegia and hemiparesis following cerebral infarction affecting left non-dominant side: Secondary | ICD-10-CM | POA: Diagnosis not present

## 2022-04-26 DIAGNOSIS — L89023 Pressure ulcer of left elbow, stage 3: Secondary | ICD-10-CM | POA: Diagnosis not present

## 2022-04-28 DIAGNOSIS — I10 Essential (primary) hypertension: Secondary | ICD-10-CM | POA: Diagnosis not present

## 2022-04-28 DIAGNOSIS — Z7401 Bed confinement status: Secondary | ICD-10-CM | POA: Diagnosis not present

## 2022-04-28 DIAGNOSIS — B356 Tinea cruris: Secondary | ICD-10-CM | POA: Diagnosis not present

## 2022-04-28 DIAGNOSIS — G894 Chronic pain syndrome: Secondary | ICD-10-CM | POA: Diagnosis not present

## 2022-04-28 DIAGNOSIS — L89023 Pressure ulcer of left elbow, stage 3: Secondary | ICD-10-CM | POA: Diagnosis not present

## 2022-04-28 DIAGNOSIS — M179 Osteoarthritis of knee, unspecified: Secondary | ICD-10-CM | POA: Diagnosis not present

## 2022-04-28 DIAGNOSIS — I69354 Hemiplegia and hemiparesis following cerebral infarction affecting left non-dominant side: Secondary | ICD-10-CM | POA: Diagnosis not present

## 2022-04-28 DIAGNOSIS — I4819 Other persistent atrial fibrillation: Secondary | ICD-10-CM | POA: Diagnosis not present

## 2022-04-28 DIAGNOSIS — I2510 Atherosclerotic heart disease of native coronary artery without angina pectoris: Secondary | ICD-10-CM | POA: Diagnosis not present

## 2022-04-28 DIAGNOSIS — Z7901 Long term (current) use of anticoagulants: Secondary | ICD-10-CM | POA: Diagnosis not present

## 2022-05-01 ENCOUNTER — Other Ambulatory Visit: Payer: Self-pay | Admitting: Cardiovascular Disease

## 2022-05-01 DIAGNOSIS — L89023 Pressure ulcer of left elbow, stage 3: Secondary | ICD-10-CM | POA: Diagnosis not present

## 2022-05-01 DIAGNOSIS — I10 Essential (primary) hypertension: Secondary | ICD-10-CM

## 2022-05-01 DIAGNOSIS — I2510 Atherosclerotic heart disease of native coronary artery without angina pectoris: Secondary | ICD-10-CM | POA: Diagnosis not present

## 2022-05-01 DIAGNOSIS — Z7901 Long term (current) use of anticoagulants: Secondary | ICD-10-CM | POA: Diagnosis not present

## 2022-05-01 DIAGNOSIS — I4819 Other persistent atrial fibrillation: Secondary | ICD-10-CM | POA: Diagnosis not present

## 2022-05-01 DIAGNOSIS — I69354 Hemiplegia and hemiparesis following cerebral infarction affecting left non-dominant side: Secondary | ICD-10-CM | POA: Diagnosis not present

## 2022-05-02 ENCOUNTER — Telehealth: Payer: Self-pay | Admitting: Family Medicine

## 2022-05-02 NOTE — Telephone Encounter (Signed)
Requesting order for palliative care please advise

## 2022-05-02 NOTE — Telephone Encounter (Signed)
Caller Name: Langley Gauss from [facility] [phone] opt [phone]  Reason for Call: Pt is ready for palletive care and they need Dr Bebe Shaggy approval for them to be that care. Please call to give order.

## 2022-05-03 DIAGNOSIS — I10 Essential (primary) hypertension: Secondary | ICD-10-CM | POA: Diagnosis not present

## 2022-05-03 DIAGNOSIS — I69354 Hemiplegia and hemiparesis following cerebral infarction affecting left non-dominant side: Secondary | ICD-10-CM | POA: Diagnosis not present

## 2022-05-03 DIAGNOSIS — L89023 Pressure ulcer of left elbow, stage 3: Secondary | ICD-10-CM | POA: Diagnosis not present

## 2022-05-03 DIAGNOSIS — Z7901 Long term (current) use of anticoagulants: Secondary | ICD-10-CM | POA: Diagnosis not present

## 2022-05-03 DIAGNOSIS — I2510 Atherosclerotic heart disease of native coronary artery without angina pectoris: Secondary | ICD-10-CM | POA: Diagnosis not present

## 2022-05-03 DIAGNOSIS — I4819 Other persistent atrial fibrillation: Secondary | ICD-10-CM | POA: Diagnosis not present

## 2022-05-03 NOTE — Telephone Encounter (Signed)
Brian [name] aware of approval for palliative care.

## 2022-05-05 ENCOUNTER — Telehealth: Payer: Self-pay

## 2022-05-05 ENCOUNTER — Encounter: Payer: Self-pay | Admitting: Family Medicine

## 2022-05-05 DIAGNOSIS — I69354 Hemiplegia and hemiparesis following cerebral infarction affecting left non-dominant side: Secondary | ICD-10-CM | POA: Diagnosis not present

## 2022-05-05 DIAGNOSIS — L89023 Pressure ulcer of left elbow, stage 3: Secondary | ICD-10-CM | POA: Diagnosis not present

## 2022-05-05 DIAGNOSIS — I10 Essential (primary) hypertension: Secondary | ICD-10-CM | POA: Diagnosis not present

## 2022-05-05 DIAGNOSIS — I2510 Atherosclerotic heart disease of native coronary artery without angina pectoris: Secondary | ICD-10-CM | POA: Diagnosis not present

## 2022-05-05 DIAGNOSIS — Z7901 Long term (current) use of anticoagulants: Secondary | ICD-10-CM | POA: Diagnosis not present

## 2022-05-05 DIAGNOSIS — I4819 Other persistent atrial fibrillation: Secondary | ICD-10-CM | POA: Diagnosis not present

## 2022-05-05 NOTE — Telephone Encounter (Signed)
Spoke with patient's daughter Roxann and scheduled a in person Palliative Consult for 05/18/22 @ 11:45 AM.  Consent obtained; updated [employer], Team List and Epic.

## 2022-05-08 DIAGNOSIS — I2510 Atherosclerotic heart disease of native coronary artery without angina pectoris: Secondary | ICD-10-CM | POA: Diagnosis not present

## 2022-05-08 DIAGNOSIS — I69354 Hemiplegia and hemiparesis following cerebral infarction affecting left non-dominant side: Secondary | ICD-10-CM | POA: Diagnosis not present

## 2022-05-08 DIAGNOSIS — I10 Essential (primary) hypertension: Secondary | ICD-10-CM | POA: Diagnosis not present

## 2022-05-08 DIAGNOSIS — Z7901 Long term (current) use of anticoagulants: Secondary | ICD-10-CM | POA: Diagnosis not present

## 2022-05-08 DIAGNOSIS — L89023 Pressure ulcer of left elbow, stage 3: Secondary | ICD-10-CM | POA: Diagnosis not present

## 2022-05-08 DIAGNOSIS — I4819 Other persistent atrial fibrillation: Secondary | ICD-10-CM | POA: Diagnosis not present

## 2022-05-10 DIAGNOSIS — L89023 Pressure ulcer of left elbow, stage 3: Secondary | ICD-10-CM | POA: Diagnosis not present

## 2022-05-10 DIAGNOSIS — I10 Essential (primary) hypertension: Secondary | ICD-10-CM | POA: Diagnosis not present

## 2022-05-10 DIAGNOSIS — I4819 Other persistent atrial fibrillation: Secondary | ICD-10-CM | POA: Diagnosis not present

## 2022-05-10 DIAGNOSIS — I2510 Atherosclerotic heart disease of native coronary artery without angina pectoris: Secondary | ICD-10-CM | POA: Diagnosis not present

## 2022-05-10 DIAGNOSIS — Z7901 Long term (current) use of anticoagulants: Secondary | ICD-10-CM | POA: Diagnosis not present

## 2022-05-10 DIAGNOSIS — I69354 Hemiplegia and hemiparesis following cerebral infarction affecting left non-dominant side: Secondary | ICD-10-CM | POA: Diagnosis not present

## 2022-05-11 ENCOUNTER — Other Ambulatory Visit: Payer: Self-pay | Admitting: Family Medicine

## 2022-05-11 DIAGNOSIS — G4709 Other insomnia: Secondary | ICD-10-CM

## 2022-05-12 DIAGNOSIS — I2510 Atherosclerotic heart disease of native coronary artery without angina pectoris: Secondary | ICD-10-CM | POA: Diagnosis not present

## 2022-05-12 DIAGNOSIS — Z7901 Long term (current) use of anticoagulants: Secondary | ICD-10-CM | POA: Diagnosis not present

## 2022-05-12 DIAGNOSIS — I10 Essential (primary) hypertension: Secondary | ICD-10-CM | POA: Diagnosis not present

## 2022-05-12 DIAGNOSIS — L89023 Pressure ulcer of left elbow, stage 3: Secondary | ICD-10-CM | POA: Diagnosis not present

## 2022-05-12 DIAGNOSIS — I4819 Other persistent atrial fibrillation: Secondary | ICD-10-CM | POA: Diagnosis not present

## 2022-05-12 DIAGNOSIS — I69354 Hemiplegia and hemiparesis following cerebral infarction affecting left non-dominant side: Secondary | ICD-10-CM | POA: Diagnosis not present

## 2022-05-16 DIAGNOSIS — I4819 Other persistent atrial fibrillation: Secondary | ICD-10-CM | POA: Diagnosis not present

## 2022-05-16 DIAGNOSIS — I10 Essential (primary) hypertension: Secondary | ICD-10-CM | POA: Diagnosis not present

## 2022-05-16 DIAGNOSIS — I69354 Hemiplegia and hemiparesis following cerebral infarction affecting left non-dominant side: Secondary | ICD-10-CM | POA: Diagnosis not present

## 2022-05-16 DIAGNOSIS — Z7901 Long term (current) use of anticoagulants: Secondary | ICD-10-CM | POA: Diagnosis not present

## 2022-05-16 DIAGNOSIS — I2510 Atherosclerotic heart disease of native coronary artery without angina pectoris: Secondary | ICD-10-CM | POA: Diagnosis not present

## 2022-05-16 DIAGNOSIS — L89023 Pressure ulcer of left elbow, stage 3: Secondary | ICD-10-CM | POA: Diagnosis not present

## 2022-05-17 ENCOUNTER — Encounter: Payer: Self-pay | Admitting: Student in an Organized Health Care Education/Training Program

## 2022-05-18 ENCOUNTER — Other Ambulatory Visit: Payer: Medicare Other | Admitting: Family Medicine

## 2022-05-18 ENCOUNTER — Encounter: Payer: Self-pay | Admitting: Family Medicine

## 2022-05-18 VITALS — BP 118/76 | HR 71 | Resp 16

## 2022-05-18 DIAGNOSIS — N390 Urinary tract infection, site not specified: Secondary | ICD-10-CM | POA: Diagnosis not present

## 2022-05-18 DIAGNOSIS — Z7901 Long term (current) use of anticoagulants: Secondary | ICD-10-CM | POA: Diagnosis not present

## 2022-05-18 DIAGNOSIS — G894 Chronic pain syndrome: Secondary | ICD-10-CM | POA: Diagnosis not present

## 2022-05-18 DIAGNOSIS — L89023 Pressure ulcer of left elbow, stage 3: Secondary | ICD-10-CM | POA: Diagnosis not present

## 2022-05-18 DIAGNOSIS — I2510 Atherosclerotic heart disease of native coronary artery without angina pectoris: Secondary | ICD-10-CM | POA: Diagnosis not present

## 2022-05-18 DIAGNOSIS — I4819 Other persistent atrial fibrillation: Secondary | ICD-10-CM | POA: Diagnosis not present

## 2022-05-18 DIAGNOSIS — I480 Paroxysmal atrial fibrillation: Secondary | ICD-10-CM

## 2022-05-18 DIAGNOSIS — I69354 Hemiplegia and hemiparesis following cerebral infarction affecting left non-dominant side: Secondary | ICD-10-CM | POA: Diagnosis not present

## 2022-05-18 DIAGNOSIS — I10 Essential (primary) hypertension: Secondary | ICD-10-CM | POA: Diagnosis not present

## 2022-05-18 NOTE — Progress Notes (Signed)
[facility] Palliative Care Consult Note Telephone: [phone]  Fax: [phone]   Date of encounter: 05/18/22 12:42 PM PATIENT NAME: Brian [name] [address] [place] [address]   [phone] (home)  DOB: 02/24/1933 MRN: [id number] PRIMARY CARE PROVIDER:    Libby Maw, MD,  [place] [address] [phone]  REFERRING PROVIDER:   Libby [name], [place],  Brian [name] [address] [phone]  RESPONSIBLE PARTY:    Contact Information     Name Relation Home Work [place] Spouse [phone]    [place] Daughter [phone]     [place], [place] [phone]        I met face to face with patient and caregivers in his home. Prior to visit spoke with Daughter Brian [name] by phone.  Palliative Care was asked to follow this patient by consultation request of  Brian [name],* to address advance care planning and complex medical decision making. This is the initial visit.          ASSESSMENT, SYMPTOM MANAGEMENT AND PLAN / RECOMMENDATIONS:  Chronic Pain Management Will have to be discussed with supervising physician about pain/anxiety management since pt has existing pain contract. Has Lyrica RX per PDMP for 25 mg written by Gillis Santa in [place] Oxycodone 7.5/325 mg (#90 for 30 days) written by PCP Dr Ethelene Hal. (Fills noted on PDMP on 03/31/22 and 02/15/22.  2.  Paroxysmal atrial fibrillation Stable, rate controlled on current Toprol XL 25 mg with anticoagulation with reduced dose Eliquis. Advised about sx of poor rate control   Follow up Palliative Care Visit: Palliative care will continue to follow for complex medical decision making, advance care planning, and clarification of goals. Return TBD.    This visit was coded based on medical decision making (MDM).  PPS: 50%  HOSPICE  ELIGIBILITY/DIAGNOSIS: TBD  Chief Complaint:  [place] received a referral to follow up with patient for chronic disease management in pt with hx of CVA with residual left sided hemiparesis and atrial fibrillation. Palliative Care is also following for advance directive planning and defining/refining goals of care.  Daughter is requesting that Palliative Care take over pain and anxiety management for patient.  HISTORY OF PRESENT ILLNESS:  [name] is a 88 y.o. year old male with hx of CVA with residual left sided weakness (pt is right hand dominant).  He has atrial fibrillation, HTN, coronary atherosclerosis, CAD, PUD, dysphagia (post stroke), GERD,  OA, sacroiliitis, lumbar spondylosis, scoliosis, BPH with urinary hesitancy/retention, hx of mitral valve repair and DVT, pressure injury of right buttock, debility, syncope.  Pt sees pain management specialist and daughter is not feeling like he has good pain control and wants something that can give a more continuous pain relief like a patch and she states he has significant anxiety.  She would like Palliative Care to take over prescribing for anxiety and pain.  Advised that I will have to follow up with upper management to see if this is something we could even provide given that pt already has pain management.  Daughter not present in the home, wife present in home but not for interview as she is significantly debilitated herself.  Pt has caregivers who are present and helping.  He c/o having lost a lot of weight while in the hospital. Denies falls. He has a condom catheter which is changed twice a day.  Appetite.  No trouble  controlling bowel.  Pain is a 6 on average, at best is 4-5 after pain med and sometimes worse. He was worse earlier in the week on Tuesday when it was rainy.  Denies CP, SOB, palpitations.  No nausea and vomiting. Son is present in evenings and at night to help with bedtime preparations.  Family does not  want to consider placement.  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 He is unable to significantly shift his position.  He is asking about a cardiac monitor to check to see if he is in atrial fibrillation that he can have at home.  Advised pt that it is not whether or not he is in afib but if there is anything that needs to be done and if so he would likely be symptomatic with lightheadedness, CP, SOB, nausea.  Advised that most of the time it is problematic only when symptomatic because rate is too slow or too fast.  Advised he is being treated with Toprol XL to control rate and Eliquis for anticoagulation.  Advised that there would likely be no further treatment to be done unless he was symptomatic.  He also laments that he cannot get into his pool or walk.  Asks this provider about walking.  Caregiver states he has to have 2 person assist to do any ambulation and only for very short distances.  Advised pt since he has some hemiparesis of left side remote from his CVA that is unlikely he would be able to ambulate independently without significant fall risk.  Advised that Eliquis, if he fell, would contribute to high risk for significant bleeding and if Eliquis were stopped he would have increased risk for repeat CVA.  History obtained from review of EMR, discussion with daughter, family caregiver and/or [name].      Latest Ref Rng & Units 03/14/2022    7:04 PM 02/28/2022    5:04 PM 11/01/2021    3:28 PM  CMP  Glucose 70 - 99 mg/dL 115  95  126   BUN 8 - 23 mg/dL $Remove'18  15  15   'Ybjzrlq$ Creatinine 0.61 - 1.24 mg/dL 0.98  0.68  0.67   Sodium 135 - 145 mmol/L 137  139  139   Potassium 3.5 - 5.1 mmol/L 3.7  4.5  4.3   Chloride 98 - 111 mmol/L 104  104  105   CO2 22 - 32 mmol/L $RemoveB'26  25  30   'FICqSdYu$ Calcium 8.9 - 10.3 mg/dL 8.8  9.0  9.0   Total Protein 6.5 - 8.1 g/dL 6.3   6.0   Total Bilirubin 0.3 - 1.2 mg/dL 0.8   0.3   Alkaline Phos 38 - 126  U/L 68   62   AST 15 - 41 U/L 25   16   ALT 0 - 44 U/L 16   7        Latest Ref Rng & Units 03/15/2022    3:48 AM 03/14/2022    7:04 PM 02/28/2022    5:04 PM  CBC  WBC 4.0 - 10.5 K/uL 8.6  11.1  6.7   Hemoglobin 13.0 - 17.0 g/dL 10.1  10.5  11.0   Hematocrit 39.0 - 52.0 % 32.5  33.9  35.5   Platelets 150 - 400 K/uL 199  237  179    03/14/22 Fecal occult blood positive 04/11/22 UDS negative including opiates and insufficient  volume to check oxycodones 03/14/22 Portable CXR: IMPRESSION: Cardiac enlargement. Small left pleural effusion with basilar atelectasis or infiltration.  03/14/22 Left elbow: FINDINGS: Diffuse bone demineralization. No evidence of acute fracture or dislocation of the left elbow. No focal bone lesion or bone destruction. No significant effusion. Soft tissue swelling over the posterior left elbow consistent with history of wound, likely contusion.   IMPRESSION: Posterior soft tissue swelling.  No acute fracture or dislocation.   03/15/22 2d echo 1. Left ventricular ejection fraction, by estimation, is 55 to 60%. Normal LV function, no regional wall motion abnormalities and mild left ventricular hypertrophy.   2. Right ventricular systolic function is normal. The right ventricular  size is mildly enlarged. Moderately elevated pulmonary artery  systolic pressure. The estimated right ventricular systolic pressure is  42.6 mmHg.   3. Biatrial severe dilatation   4.  The mitral valve has been repaired/replaced. Mild mitral valve  regurgitation. The mean mitral valve gradient is 3.6 mmHg with average  heart rate of 70 bpm. There is a unknown size prosthetic annuloplasty ring  present in the mitral position. (2002 [facility])  5. Tricuspid valve regurgitation is severe.   6. The aortic valve is abnormal. There is mild calcification of the  aortic valve. Aortic valve regurgitation is moderate. No aortic stenosis  is present.   8. The inferior vena cava is dilated in size  with <50% respiratory  variability, suggesting right atrial pressure of 15 mmHg.  03/15/22 CT Angio chest PE/CT abdomen/pelvis with contrast: IMPRESSION: No evidence of pulmonary embolism. No evidence of acute cardiopulmonary disease.   No acute findings in the abdomen/pelvis.   Additional ancillary findings as above.  03/15/22 CT head without contrast: IMPRESSION: 1. No CT evidence for acute intracranial abnormality. 2. Chronic right MCA infarct. Atrophy and mild chronic small vessel ischemic changes of the white matter.  I reviewed EMR for available labs, medications, imaging, studies and related documents.  No new records since last visit/Records reviewed and summarized above.   ROS General: NAD EYES: denies vision changes ENMT: denies dysphagia Cardiovascular: denies chest pain, denies DOE Pulmonary: denies cough, denies increased SOB Abdomen: endorses good appetite, denies constipation, endorses continence of bowel GU: denies dysuria, wears condom catheter MSK:  endorses left sided weakness, no falls reported Skin: denies rashes, has open buttocks wound Neurological: endorses multiple joint pain, denies insomnia Psych: Endorses positive mood Heme/lymph/immuno: denies bruises, abnormal bleeding  Physical Exam: Current and past weights: 150 lbs Constitutional: NAD General: WNWD  ENMT: hard of hearing even with hearing aid in, oral mucous membranes moist, dentition intact CV: S1S2, IRIR, no LE edema Pulmonary: CTAB, no increased work of breathing, no cough Abdomen: normo-active BS + 4 quadrants, soft and non tender, no ascites GU: deferred MSK: no sarcopenia, moves all extremities, bed or chair bound Skin: warm and dry Neuro:  noted generalized weakness-predominantly left sided, no cognitive impairment Psych: non-anxious affect, A and O x 3 Hem/lymph/immuno: no widespread bruising  CURRENT PROBLEM LIST:  Patient Active Problem List   Diagnosis Date Noted   Lumbar  spondylosis 04/11/2022   Lumbar degenerative disc disease 04/11/2022   Bilateral primary osteoarthritis of knee 04/11/2022   Pain management contract signed 04/11/2022   Chronic pain syndrome 04/11/2022   Syncope 03/15/2022   Dysuria 03/15/2022   GERD (gastroesophageal reflux disease) 03/15/2022   Pressure injury of skin 03/15/2022   Pressure injury of left elbow, stage 2 ([place]) 01/31/2022   Urinary retention 12/06/2021   Depression with anxiety  11/01/2021   Pressure injury of skin of sacral region 03/29/2021   Pressure injury of left buttock, stage 2 ([place]) 03/29/2021   Cellulitis of left upper extremity 02/08/2021   Pressure injury of left elbow, stage 1 02/08/2021   Other insomnia 02/08/2021   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   Obesity (BMI 30.0-34.9) 06/29/2020   Acute non-recurrent sinusitis 05/11/2020   Need for influenza vaccination 05/11/2020   Paroxysmal atrial fibrillation ([place]) 05/11/2020   Benign prostatic hyperplasia with urinary hesitancy 05/11/2020   Venous ulcer, limited to breakdown of skin ([place]) 02/06/2020   Swelling of lower extremity 02/06/2020   Noise effect on both inner ears 08/12/2019   Presbycusis of both ears 08/12/2019   Left ear pain 05/20/2019   Pressure injury of right buttock, stage 1 05/20/2019   Debilitated 05/20/2019   Elevated glucose 04/08/2019   Osteoarthritis of right knee 03/25/2019   Serum potassium elevated 03/18/2019   Mixed hyperlipidemia 09/24/2018   Gait disturbance 09/24/2018   Anxiety 04/30/2018   Chronic radicular lumbar pain 02/14/2018   Pain in joint of left shoulder 12/28/2017   Osteoarthritis of left glenohumeral joint 12/28/2017   Pain in left foot 10/23/2017   History of cerebrovascular accident 10/18/2017   Coronary artery disease involving native coronary artery of native heart without angina pectoris    Persistent atrial fibrillation (HCC)    Dysarthria, post-stroke    Dysphagia, post-stroke     Cerebrovascular accident (CVA) due to thrombosis of precerebral artery ([place])    Scoliosis (and kyphoscoliosis), idiopathic 01/26/2014   Arthralgia of hip 02/07/2012   Osteoarthritis of hip 02/07/2012   Sacroiliitis ([place]) 02/07/2012   ROTATOR CUFF SYNDROME, LEFT 03/07/2010   OTH MALIG NEOPLASM SKIN OTH&UNSPEC PARTS FACE 12/06/2009   ACTINIC KERATOSIS, HEAD 09/06/2009   Osteoarthritis 02/20/2009   History of mitral valve repair 02/20/2009   HIP REPLACEMENT, TOTAL, HX OF 02/20/2009   APPENDECTOMY, HX OF 02/20/2009   HERNIORRHAPHY, HX OF 02/20/2009   EPISTAXIS, RECURRENT 11/19/2008   UNS ADVRS EFF UNS RX MEDICINAL&BIOLOGICAL SBSTNC 06/12/2008   OTHER AND UNSPECIFIED MITRAL VALVE DISEASES 03/12/2008   ATRIAL FLUTTER 03/12/2008   Essential hypertension 03/06/2007   PEPTIC ULCER DISEASE 03/06/2007   BENIGN PROSTATIC HYPERTROPHY 03/06/2007   Coronary atherosclerosis 02/13/2007   ROSACEA 02/13/2007   History of peristent atrial fibrillation 02/13/2007   DVT, HX OF 02/13/2007   PAST MEDICAL HISTORY:  Active Ambulatory Problems    Diagnosis Date Noted   OTHER AND UNSPECIFIED MITRAL VALVE DISEASES 03/12/2008   Essential hypertension 03/06/2007   Coronary atherosclerosis 02/13/2007   ATRIAL FLUTTER 03/12/2008   PEPTIC ULCER DISEASE 03/06/2007   BENIGN PROSTATIC HYPERTROPHY 03/06/2007   ROSACEA 02/13/2007   ACTINIC KERATOSIS, HEAD 09/06/2009   Osteoarthritis 02/20/2009   ROTATOR CUFF SYNDROME, LEFT 03/07/2010   EPISTAXIS, RECURRENT 11/19/2008   UNS ADVRS EFF UNS RX MEDICINAL&BIOLOGICAL SBSTNC 06/12/2008   History of peristent atrial fibrillation 02/13/2007   DVT, HX OF 02/13/2007   History of mitral valve repair 02/20/2009   HIP REPLACEMENT, TOTAL, HX OF 02/20/2009   APPENDECTOMY, HX OF 02/20/2009   HERNIORRHAPHY, HX OF 02/20/2009   OTH MALIG NEOPLASM SKIN OTH&UNSPEC PARTS FACE 12/06/2009   Scoliosis (and kyphoscoliosis), idiopathic 01/26/2014   Cerebrovascular accident (CVA) due to  thrombosis of precerebral artery ([place])    Coronary artery disease involving native coronary artery of native heart without angina pectoris    Persistent atrial fibrillation (HCC)    Dysarthria, post-stroke    Dysphagia, post-stroke  Pain in joint of left shoulder 12/28/2017   Arthralgia of hip 02/07/2012   Chronic radicular lumbar pain 02/14/2018   Osteoarthritis of hip 02/07/2012   Osteoarthritis of left glenohumeral joint 12/28/2017   Pain in left foot 10/23/2017   Sacroiliitis ([place]) 02/07/2012   Anxiety 04/30/2018   Mixed hyperlipidemia 09/24/2018   Gait disturbance 09/24/2018   Serum potassium elevated 03/18/2019   Elevated glucose 04/08/2019   Left ear pain 05/20/2019   Pressure injury of right buttock, stage 1 05/20/2019   Debilitated 05/20/2019   Venous ulcer, limited to breakdown of skin ([place]) 02/06/2020   Swelling of lower extremity 02/06/2020   Acute non-recurrent sinusitis 05/11/2020   Need for influenza vaccination 05/11/2020   Paroxysmal atrial fibrillation ([place]) 05/11/2020   Benign prostatic hyperplasia with urinary hesitancy 05/11/2020   Obesity (BMI 30.0-34.9) 06/29/2020   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   Cellulitis of left upper extremity 02/08/2021   Pressure injury of left elbow, stage 1 02/08/2021   Other insomnia 02/08/2021   Pressure injury of skin of sacral region 03/29/2021   Pressure injury of left buttock, stage 2 ([place]) 03/29/2021   Noise effect on both inner ears 08/12/2019   Presbycusis of both ears 08/12/2019   History of cerebrovascular accident 10/18/2017   Osteoarthritis of right knee 03/25/2019   Depression with anxiety 11/01/2021   Urinary retention 12/06/2021   Pressure injury of left elbow, stage 2 ([place]) 01/31/2022   Syncope 03/15/2022   Dysuria 03/15/2022   GERD (gastroesophageal reflux disease) 03/15/2022   Pressure injury of skin 03/15/2022   Lumbar spondylosis 04/11/2022   Lumbar degenerative disc disease 04/11/2022    Bilateral primary osteoarthritis of knee 04/11/2022   Pain management contract signed 04/11/2022   Chronic pain syndrome 04/11/2022   Resolved Ambulatory Problems    Diagnosis Date Noted   SYNCOPE, HX OF 02/20/2009   CVA (cerebral infarction) 05/04/2016   Acute CVA (cerebrovascular accident) ([place])    Stroke (cerebrum) (HCC)    Arterial hypotension    Hypocalcemia    Acute respiratory failure (HCC)    Cerebral infarction due to thrombosis of precerebral artery ([place])    Cerebrovascular accident (CVA) due to thrombosis of right middle cerebral artery (HCC)    Benign essential HTN    TIA (transient ischemic attack)    H/O mitral valve repair    Tachypnea    Hypokalemia    Acute blood loss anemia    Thrombocytopenia ([place])    Dysphagia    CAP (community acquired pneumonia) 03/13/2017   Depression, recurrent ([place]) 08/02/2021   Past Medical History:  Diagnosis Date   Anemia    BPH (benign prostatic hypertrophy)    Hemorrhage of gastrointestinal tract, unspecified    History of open heart surgery    Hypertension    Osteoarthrosis, unspecified whether generalized or localized, unspecified site    Stroke [facility])    SOCIAL HX:  Social History   Tobacco Use   Smoking status: Former   Smokeless tobacco: Never   Tobacco comments:    quit 77 yr ago  Substance Use Topics   Alcohol use: No   FAMILY HX:  Family History  Problem Relation Age of Onset   Rheumatic fever Mother    Coronary artery disease Father        Preferred Pharmacy: ALLERGIES:  Allergies  Allergen Reactions   Novocain [Procaine Hcl]     Irregular heart beat      Other     Patient had problem  with epidural with his right hip surgery. Went to up extremity instead of lower extremity    Penicillins Itching    Has patient had a PCN reaction causing immediate rash, facial/tongue/throat swelling, SOB or lightheadedness with hypotension: Unk Has patient had a PCN reaction causing severe rash involving mucus  membranes or skin necrosis: Unk Has patient had a PCN reaction that required hospitalization: Unk Has patient had a PCN reaction occurring within the last 10 years: No If all of the above answers are "NO", then may proceed with Cephalosporin use.  No reaction noted     PERTINENT MEDICATIONS:  Outpatient Encounter Medications as of 05/18/2022  Medication Sig   ALPRAZolam (XANAX) 0.25 MG tablet Take 0.25 mg by mouth 2 (two) times daily as needed for anxiety.   apixaban (ELIQUIS) 2.5 MG TABS tablet TAKE 1 TABLET(2.5 MG) BY MOUTH TWICE DAILY (Patient taking differently: Take 2.5 mg by mouth 2 (two) times daily.)   cetirizine (ZYRTEC) 10 MG tablet TAKE 1 TABLET(10 MG) BY MOUTH AT BEDTIME (Patient taking differently: Take 10 mg by mouth at bedtime as needed for allergies.)   finasteride (PROSCAR) 5 MG tablet TAKE 1 TABLET(5 MG) BY MOUTH DAILY (Patient taking differently: Take 5 mg by mouth daily.)   fluticasone (FLONASE) 50 MCG/ACT nasal spray Place 1 spray into both nostrils daily as needed for allergies.   hydrocortisone ointment 0.5 % Apply 1 Application topically daily as needed for itching.   melatonin 5 MG TABS Take 5 mg by mouth at bedtime as needed (sleep).   metoprolol succinate (TOPROL-XL) 25 MG 24 hr tablet TAKE 1 TABLET(25 MG) BY MOUTH DAILY (Patient taking differently: Take 25 mg by mouth daily.)   Multiple Vitamin (MULTIVITAMIN) tablet Take 1 tablet by mouth 2 (two) times daily.   MUPIROCIN EX Apply 1 Application topically daily as needed (wound care).   oxyCODONE-acetaminophen (PERCOCET) 7.5-325 MG tablet Take 1 tablet by mouth every 12 (twelve) hours as needed for severe pain.   pantoprazole (PROTONIX) 40 MG tablet TAKE 1 TABLET(40 MG) BY MOUTH DAILY (Patient taking differently: Take 40 mg by mouth daily.)   polyethylene glycol powder (GLYCOLAX/MIRALAX) 17 GM/SCOOP powder Mix one scoop with water and take daily until stools loosen. May repeat as needed. (Patient taking differently:  Take 17 g by mouth daily as needed for mild constipation.)   pravastatin (PRAVACHOL) 40 MG tablet TAKE 1 TABLET(40 MG) BY MOUTH DAILY (Patient taking differently: Take 40 mg by mouth daily.)   pregabalin (LYRICA) 25 MG capsule Take 1 capsule (25 mg total) by mouth at bedtime.   QUEtiapine (SEROQUEL) 50 MG tablet TAKE 1 TABLET(50 MG) BY MOUTH AT BEDTIME   sertraline (ZOLOFT) 50 MG tablet TAKE 1 TABLET(50 MG) BY MOUTH EVERY MORNING (Patient taking differently: Take 50 mg by mouth daily.)   tamsulosin (FLOMAX) 0.4 MG CAPS capsule Take 1 capsule (0.4 mg total) by mouth daily after supper.   No facility-administered encounter medications on file as of 05/18/2022.      Advance Care Planning/Goals of Care: Goals include to maximize quality of life and symptom management. Patient/health care surrogate gave his/her permission to discuss.  CODE STATUS: Last code status listed as full code    Thank you for the opportunity to participate in the care of Mr. Earnhart.  The palliative care team will continue to follow. Please call our office at [phone] if we can be of additional assistance.   Marijo Conception, FNP-C  COVID-19 PATIENT SCREENING TOOL Asked and negative  response unless otherwise noted:  Have you had symptoms of covid, tested positive or been in contact with someone with symptoms/positive test in the past 5-10 days? No

## 2022-05-23 ENCOUNTER — Encounter (HOSPITAL_BASED_OUTPATIENT_CLINIC_OR_DEPARTMENT_OTHER): Payer: Medicare Other | Attending: General Surgery | Admitting: General Surgery

## 2022-05-23 DIAGNOSIS — L89023 Pressure ulcer of left elbow, stage 3: Secondary | ICD-10-CM | POA: Insufficient documentation

## 2022-05-23 DIAGNOSIS — I4819 Other persistent atrial fibrillation: Secondary | ICD-10-CM | POA: Diagnosis not present

## 2022-05-23 DIAGNOSIS — I2510 Atherosclerotic heart disease of native coronary artery without angina pectoris: Secondary | ICD-10-CM | POA: Diagnosis not present

## 2022-05-23 DIAGNOSIS — M1990 Unspecified osteoarthritis, unspecified site: Secondary | ICD-10-CM | POA: Diagnosis not present

## 2022-05-23 DIAGNOSIS — L89323 Pressure ulcer of left buttock, stage 3: Secondary | ICD-10-CM | POA: Diagnosis not present

## 2022-05-23 NOTE — Progress Notes (Signed)
Brian [name], Brian [name] ([id number]) Visit Report for 05/23/2022 Arrival Information Details Patient Name: Date of Service: Brian [name] ID M. 05/23/2022 10:30 A M Medical Record Number: [id number] Patient Account Number: [id number] Date of Birth/Sex: Treating RN: July 15, 1933 (88 y.o. Brian [name] Primary Care Brian [name]: Brian [name] Other Clinician: Referring Brian [name]: Treating Brian [name]/Extender: Brian [name] in Treatment: 14 Visit Information History Since Last Visit Added or deleted any medications: No Patient Arrived: Wheel Chair Any new allergies or adverse reactions: No Arrival Time: 10:53 Had a fall or experienced change in No Accompanied By: son activities of daily living that may affect Transfer Assistance: Manual risk of falls: Patient Identification Verified: Yes Signs or symptoms of abuse/neglect since last visito No Secondary Verification Process Completed: Yes Hospitalized since last visit: No Patient Requires Transmission-Based Precautions: No Implantable device outside of the clinic excluding No Patient Has Alerts: Yes cellular tissue based products placed in the center Patient Alerts: Patient on Blood Thinner since last visit: Has Dressing in Place as Prescribed: Yes Pain Present Now: Yes Electronic Signature(s) Signed: 05/23/2022 5:08:49 PM By: Brian Gouty RN, BSN Entered By: Brian [name] on 05/23/2022 10:53:55 -------------------------------------------------------------------------------- Encounter Discharge Information Details Patient Name: Date of Service: Brian [name] ID M. 05/23/2022 10:30 A M Medical Record Number: [id number] Patient Account Number: [id number] Date of Birth/Sex: Treating RN: May 01, 1933 (89 y.o. Brian [name] Primary Care Braelyn Jenson: Brian [name] Other Clinician: Referring Brian [name]: Treating Brian [name]/Extender: Brian [name] in Treatment: 14 Encounter Discharge  Information Items Post Procedure Vitals Discharge Condition: Stable Temperature (F): 98.2 Ambulatory Status: Wheelchair Pulse (bpm): 78 Discharge Destination: Home Respiratory Rate (breaths/min): 18 Transportation: Private Auto Blood Pressure (mmHg): 127/75 Accompanied By: son Schedule Follow-up Appointment: Yes Clinical Summary of Care: Patient Declined Electronic Signature(s) Signed: 05/23/2022 5:08:49 PM By: Brian Gouty RN, BSN Entered By: Brian [name] on 05/23/2022 11:55:06 -------------------------------------------------------------------------------- Lower Extremity Assessment Details Patient Name: Date of Service: Brian [name] ID M. 05/23/2022 10:30 A M Medical Record Number: [id number] Patient Account Number: [id number] Date of Birth/Sex: Treating RN: 01-01-1933 (88 y.o. Brian [name] Primary Care Terica Yogi: Brian [name] Other Clinician: Referring Cartier Mapel: Treating Brian [name]/Extender: Brian [name] in Treatment: 14 Electronic Signature(s) Signed: 05/23/2022 5:08:49 PM By: Brian Gouty RN, BSN Entered By: Brian [name] on 05/23/2022 11:01:20 -------------------------------------------------------------------------------- Multi Wound Chart Details Patient Name: Date of Service: Brian [name] ID M. 05/23/2022 10:30 A M Medical Record Number: [id number] Patient Account Number: [id number] Date of Birth/Sex: Treating RN: 01-19-1933 (88 y.o. Brian [name] Primary Care Nicholette Dolson: Brian [name] Other Clinician: Referring Gionni Vaca: Treating Callia Swim/Extender: Brian [name] in Treatment: 14 Vital Signs Height(in): 66 Pulse(bpm): 27 Weight(lbs): 185 Blood Pressure(mmHg): 127/75 Body Mass Index(BMI): 29.9 Temperature(F): 98.2 Respiratory Rate(breaths/min): 18 Photos: [N/A:N/A] Left Elbow Left Gluteus N/A Wound Location: Pressure Injury Pressure Injury N/A Wounding Event: Pressure Ulcer  Pressure Ulcer N/A Primary Etiology: Cataracts, Arrhythmia, Coronary Cataracts, Arrhythmia, Coronary N/A Comorbid History: Artery Disease, Hypotension, Artery Disease, Hypotension, Osteoarthritis Osteoarthritis 12/19/2021 04/21/2022 N/A Date Acquired: 14 0 N/A Weeks of Treatment: Open Open N/A Wound Status: No No N/A Wound Recurrence: Yes No N/A Clustered Wound: 2.8x2x0.1 0.9x1.4x0.1 N/A Measurements L x W x D (cm) 4.398 0.99 N/A A (cm) : rea 0.44 0.099 N/A Volume (cm) : 54.30% N/A N/A % Reduction in A rea: 54.30% N/A N/A % Reduction in Volume: Category/Stage III Category/Stage III N/A Classification: Medium Medium N/A Exudate A mount: Serosanguineous Serosanguineous  N/A Exudate Type: red, brown red, brown N/A Exudate Color: Distinct, outline attached Flat and Intact N/A Wound Margin: Large (67-100%) Large (67-100%) N/A Granulation A mount: Red, Hyper-granulation Red N/A Granulation Quality: Small (1-33%) Small (1-33%) N/A Necrotic Amount: Fat Layer (Subcutaneous Tissue): Yes Fat Layer (Subcutaneous Tissue): Yes N/A Exposed Structures: Fascia: No Fascia: No Tendon: No Tendon: No Muscle: No Muscle: No Joint: No Joint: No Bone: No Bone: No Small (1-33%) Small (1-33%) N/A Epithelialization: N/A Debridement - Selective/Open Wound N/A Debridement: Pre-procedure Verification/Time Out N/A 11:25 N/A Taken: N/A Lidocaine 4% Topical Solution N/A Pain Control: N/A Slough N/A Tissue Debrided: N/A Non-Viable Tissue N/A Level: N/A 1.26 N/A Debridement A (sq cm): rea N/A Curette N/A Instrument: N/A Minimum N/A Bleeding: N/A Pressure N/A Hemostasis A chieved: N/A 0 N/A Procedural Pain: N/A 0 N/A Post Procedural Pain: N/A Procedure was tolerated well N/A Debridement Treatment Response: N/A 0.9x1.4x0.1 N/A Post Debridement Measurements L x W x D (cm) N/A 0.099 N/A Post Debridement Volume: (cm) N/A Category/Stage III N/A Post Debridement Stage: No  Abnormalities Noted Rash: Yes N/A Periwound Skin Texture: No Abnormalities Noted Dry/Scaly: Yes N/A Periwound Skin Moisture: Maceration: No No Abnormalities Noted No Abnormalities Noted N/A Periwound Skin Color: No Abnormality No Abnormality N/A Temperature: Yes Yes N/A Tenderness on Palpation: Chemical Cauterization Debridement N/A Procedures Performed: Treatment Notes Electronic Signature(s) Signed: 05/23/2022 11:43:22 AM By: Fredirick Maudlin MD FACS Signed: 05/23/2022 5:08:49 PM By: Brian Gouty RN, BSN Entered By: Fredirick Maudlin on 05/23/2022 11:43:21 -------------------------------------------------------------------------------- Multi-Disciplinary Care Plan Details Patient Name: Date of Service: Brian [name] ID M. 05/23/2022 10:30 A M Medical Record Number: [id number] Patient Account Number: [id number] Date of Birth/Sex: Treating RN: [birth year] (88 y.o. Brian [name] Primary Care Aran Menning: Brian [name] Other Clinician: Referring Saksham Akkerman: Treating Perian Tedder/Extender: Brian [name] in Treatment: 14 Multidisciplinary Care Plan reviewed with physician Active Inactive Abuse / Safety / Falls / Self Care Management Nursing Diagnoses: Impaired physical mobility Potential for falls Goals: Patient/caregiver will verbalize understanding of skin care regimen Date Initiated: 02/14/2022 Target Resolution Date: 06/20/2022 Goal Status: Active Patient/caregiver will verbalize/demonstrate measures taken to prevent injury and/or falls Date Initiated: 02/14/2022 Target Resolution Date: 06/20/2022 Goal Status: Active Interventions: Assess fall risk on admission and as needed Assess self care needs on admission and as needed Notes: Wound/Skin Impairment Nursing Diagnoses: Impaired tissue integrity Knowledge deficit related to ulceration/compromised skin integrity Goals: Patient/caregiver will verbalize understanding of skin care regimen Date  Initiated: 02/14/2022 Target Resolution Date: 06/20/2022 Goal Status: Active Ulcer/skin breakdown will have a volume reduction of 30% by week 4 Date Initiated: 02/14/2022 Date Inactivated: 05/23/2022 Target Resolution Date: 05/05/2022 Goal Status: Unmet Unmet Reason: new PU left glut Interventions: Assess patient/caregiver ability to obtain necessary supplies Assess patient/caregiver ability to perform ulcer/skin care regimen upon admission and as needed Assess ulceration(s) every visit Treatment Activities: Skin care regimen initiated : 02/14/2022 Topical wound management initiated : 02/14/2022 Notes: Electronic Signature(s) Signed: 05/23/2022 5:08:49 PM By: Brian Gouty RN, BSN Entered By: Brian [name] on 05/23/2022 11:18:18 -------------------------------------------------------------------------------- Pain Assessment Details Patient Name: Date of Service: Brian [name] ID M. 05/23/2022 10:30 A M Medical Record Number: [id number] Patient Account Number: [id number] Date of Birth/Sex: Treating RN: [birth year] (88 y.o. Brian [name] Primary Care Jojo Pehl: Brian [name] Other Clinician: Referring Sharmayne Jablon: Treating Louvinia Cumbo/Extender: Brian [name] in Treatment: 14 Active Problems Location of Pain Severity and Description of Pain Patient Has Paino Yes Site Locations Pain Location: Generalized Pain,  Pain in Ulcers With Dressing Change: Yes Duration of the Pain. Constant / Intermittento Constant Rate the pain. Current Pain Level: 6 Character of Pain Describe the Pain: Aching Pain Management and Medication Current Pain Management: Medication: Yes Is the Current Pain Management Adequate: Adequate How does your wound impact your activities of daily livingo Sleep: Yes Bathing: No Appetite: No Relationship With Others: No Bladder Continence: No Emotions: No Bowel Continence: No Hobbies: No Toileting: No Dressing: No Electronic  Signature(s) Signed: 05/23/2022 5:08:49 PM By: Brian Gouty RN, BSN Entered By: Brian [name] on 05/23/2022 10:55:32 -------------------------------------------------------------------------------- Patient/Caregiver Education Details Patient Name: Date of Service: Brian [name] ID M. 10/3/2023andnbsp10:30 A M Medical Record Number: [id number] Patient Account Number: [id number] Date of Birth/Gender: Treating RN: 1932/09/27 (88 y.o. Brian [name] Primary Care Physician: Brian [name] Other Clinician: Referring Physician: Treating Physician/Extender: Brian [name] in Treatment: 14 Education Assessment Education Provided To: Patient Education Topics Provided Pressure: Methods: Explain/Verbal Responses: Reinforcements needed, State content correctly Wound/Skin Impairment: Methods: Explain/Verbal Responses: Reinforcements needed, State content correctly Electronic Signature(s) Signed: 05/23/2022 5:08:49 PM By: Brian Gouty RN, BSN Entered By: Brian [name] on 05/23/2022 11:19:04 -------------------------------------------------------------------------------- Wound Assessment Details Patient Name: Date of Service: Brian [name] ID M. 05/23/2022 10:30 A M Medical Record Number: [id number] Patient Account Number: [id number] Date of Birth/Sex: Treating RN: 1932-11-04 (88 y.o. Brian [name] Primary Care Rishik Tubby: Brian [name] Other Clinician: Referring Laterica Matarazzo: Treating Akeiba Axelson/Extender: Brian [name] in Treatment: 14 Wound Status Wound Number: 1 Primary Pressure Ulcer Etiology: Wound Location: Left Elbow Wound Status: Open Wounding Event: Pressure Injury Comorbid Cataracts, Arrhythmia, Coronary Artery Disease, Date Acquired: 12/19/2021 History: Hypotension, Osteoarthritis Weeks Of Treatment: 14 Clustered Wound: Yes Photos Wound Measurements Length: (cm) 2.8 Width: (cm) 2 Depth: (cm) 0.1 Area:  (cm) 4.398 Volume: (cm) 0.44 % Reduction in Area: 54.3% % Reduction in Volume: 54.3% Epithelialization: Small (1-33%) Tunneling: No Undermining: No Wound Description Classification: Category/Stage III Wound Margin: Distinct, outline attached Exudate Amount: Medium Exudate Type: Serosanguineous Exudate Color: red, brown Foul Odor After Cleansing: No Slough/Fibrino Yes Wound Bed Granulation Amount: Large (67-100%) Exposed Structure Granulation Quality: Red, Hyper-granulation Fascia Exposed: No Necrotic Amount: Small (1-33%) Fat Layer (Subcutaneous Tissue) Exposed: Yes Necrotic Quality: Adherent Slough Tendon Exposed: No Muscle Exposed: No Joint Exposed: No Bone Exposed: No Periwound Skin Texture Texture Color No Abnormalities Noted: Yes No Abnormalities Noted: Yes Moisture Temperature / Pain No Abnormalities Noted: Yes Temperature: No Abnormality Tenderness on Palpation: Yes Treatment Notes Wound #1 (Elbow) Wound Laterality: Left Cleanser Soap and Water Discharge Instruction: May shower and wash wound with dial antibacterial soap and water prior to dressing change. Wound Cleanser Discharge Instruction: Cleanse the wound with wound cleanser prior to applying a clean dressing using gauze sponges, not tissue or cotton balls. Peri-Wound Care Topical Primary Dressing Hydrofera Blue Ready Foam, 2.5 x2.5 in Discharge Instruction: Apply to wound bed as instructed Secondary Dressing ALLEVYN Gentle Border, 5x5 (in/in) Discharge Instruction: Apply over primary dressing as directed. Secured With Compression Wrap Compression Stockings Add-Ons Soil scientist, 6 (in) [employer]) Signed: 05/23/2022 5:08:49 PM By: Brian Gouty RN, BSN Entered By: Brian [name] on 05/23/2022 11:14:03 -------------------------------------------------------------------------------- Wound Assessment Details Patient Name: Date of Service: Brian [name] ID M. 05/23/2022  10:30 A M Medical Record Number: [id number] Patient Account Number: [id number] Date of Birth/Sex: Treating RN: 19-May-1933 (88 y.o. Brian [name] Primary Care Kojo Liby: Brian [name] Other Clinician: Referring Lindamarie Maclachlan: Treating Byrd Terrero/Extender: Celine Ahr  Dellie Catholic, [place] Dom in Treatment: 14 Wound Status Wound Number: 2 Primary Pressure Ulcer Etiology: Wound Location: Left Gluteus Wound Status: Open Wounding Event: Pressure Injury Comorbid Cataracts, Arrhythmia, Coronary Artery Disease, Date Acquired: 04/21/2022 History: Hypotension, Osteoarthritis Weeks Of Treatment: 0 Clustered Wound: No Photos Wound Measurements Length: (cm) 0.9 Width: (cm) 1.4 Depth: (cm) 0.1 Area: (cm) 0.99 Volume: (cm) 0.099 % Reduction in Area: % Reduction in Volume: Epithelialization: Small (1-33%) Tunneling: No Undermining: No Wound Description Classification: Category/Stage III Wound Margin: Flat and Intact Exudate Amount: Medium Exudate Type: Serosanguineous Exudate Color: red, brown Foul Odor After Cleansing: No Slough/Fibrino Yes Wound Bed Granulation Amount: Large (67-100%) Exposed Structure Granulation Quality: Red Fascia Exposed: No Necrotic Amount: Small (1-33%) Fat Layer (Subcutaneous Tissue) Exposed: Yes Necrotic Quality: Adherent Slough Tendon Exposed: No Muscle Exposed: No Joint Exposed: No Bone Exposed: No Periwound Skin Texture Texture Color No Abnormalities Noted: No No Abnormalities Noted: Yes Rash: Yes Temperature / Pain Temperature: No Abnormality Moisture No Abnormalities Noted: No Tenderness on Palpation: Yes Dry / Scaly: Yes Maceration: No Treatment Notes Wound #2 (Gluteus) Wound Laterality: Left Cleanser Peri-Wound Care Topical Primary Dressing Hydrofera Blue Ready Foam, 2.5 x2.5 in Discharge Instruction: Apply to wound bed as instructed Secondary Dressing ALLEVYN Gentle Border, 3x3 (in/in) Discharge Instruction: Apply over primary  dressing as directed. Secured With Compression Wrap Compression Stockings Environmental education officer) Signed: 05/23/2022 5:08:49 PM By: Brian Gouty RN, BSN Entered By: Brian [name] on 05/23/2022 11:14:31 -------------------------------------------------------------------------------- Vitals Details Patient Name: Date of Service: Brian [name] ID M. 05/23/2022 10:30 A M Medical Record Number: [id number] Patient Account Number: [id number] Date of Birth/Sex: Treating RN: 1932-08-31 (89 y.o. Brian [name] Primary Care Retia Cordle: Brian [name] Other Clinician: Referring Piercen Covino: Treating Anala Whisenant/Extender: Brian [name] in Treatment: 14 Vital Signs Time Taken: 10:53 Temperature (F): 98.2 Height (in): 66 Pulse (bpm): 78 Weight (lbs): 185 Respiratory Rate (breaths/min): 18 Body Mass Index (BMI): 29.9 Blood Pressure (mmHg): 127/75 Reference Range: 80 - 120 mg / dl Electronic Signature(s) Signed: 05/23/2022 5:08:49 PM By: Brian Gouty RN, BSN Entered By: Brian [name] on 05/23/2022 10:54:16

## 2022-05-23 NOTE — Progress Notes (Signed)
BRIGG, CAPE ([id number]) Visit Report for 05/23/2022 Chief Complaint Document Details Patient Name: Date of Service: [name] ID M. 05/23/2022 10:30 A M Medical Record Number: [id number] Patient Account Number: [id number] Date of Birth/Sex: Treating RN: [birth year] (88 y.o. [name] Primary Care Provider: Abelino Derrick Other Clinician: Referring Provider: Treating Provider/Extender: Sandi Raveling in Treatment: 14 Information Obtained from: Patient Chief Complaint 08/05/2019; patient is here for review of pressure ulcers on his buttock 02/14/22: patient is here for review of pressure ulcers on his left elbow Electronic Signature(s) Signed: 05/23/2022 11:43:28 AM By: Fredirick Maudlin MD FACS Entered By: Fredirick Maudlin on 05/23/2022 11:43:28 -------------------------------------------------------------------------------- Debridement Details Patient Name: Date of Service: [name] ID M. 05/23/2022 10:30 A M Medical Record Number: [id number] Patient Account Number: [id number] Date of Birth/Sex: Treating RN: 1932/12/29 (88 y.o. [name] Primary Care Provider: Abelino Derrick Other Clinician: Referring Provider: Treating Provider/Extender: Sandi Raveling in Treatment: 14 Debridement Performed for Assessment: Wound #2 Left Gluteus Performed By: Physician Fredirick Maudlin, MD Debridement Type: Debridement Level of Consciousness (Pre-procedure): Awake and Alert Pre-procedure Verification/Time Out Yes - 11:25 Taken: Start Time: 11:25 Pain Control: Lidocaine 4% T opical Solution T Area Debrided (L x W): otal 0.9 (cm) x 1.4 (cm) = 1.26 (cm) Tissue and other material debrided: Non-Viable, Slough, Slough Level: Non-Viable Tissue Debridement Description: Selective/Open Wound Instrument: Curette Bleeding: Minimum Hemostasis Achieved: Pressure Procedural Pain: 0 Post Procedural Pain: 0 Response to  Treatment: Procedure was tolerated well Level of Consciousness (Post- Awake and Alert procedure): Post Debridement Measurements of Total Wound Length: (cm) 0.9 Stage: Category/Stage III Width: (cm) 1.4 Depth: (cm) 0.1 Volume: (cm) 0.099 Character of Wound/Ulcer Post Debridement: Improved Post Procedure Diagnosis Same as Pre-procedure Notes scribed by Baruch Gouty, RN for Dr. Celine Ahr Electronic Signature(s) Signed: 05/23/2022 4:00:31 PM By: Fredirick Maudlin MD FACS Signed: 05/23/2022 5:08:49 PM By: Baruch Gouty RN, BSN Previous Signature: 05/23/2022 12:28:03 PM Version By: Fredirick Maudlin MD FACS Entered By: Baruch Gouty on 05/23/2022 15:58:42 -------------------------------------------------------------------------------- HPI Details Patient Name: Date of Service: Delila Spence V ID M. 05/23/2022 10:30 A M Medical Record Number: [id number] Patient Account Number: [id number] Date of Birth/Sex: Treating RN: April 30, 1933 (88 y.o. [name] Primary Care Provider: Abelino Derrick Other Clinician: Referring Provider: Treating Provider/Extender: Sandi Raveling in Treatment: 14 History of Present Illness HPI Description: ADMISSION 08/05/19 This is an 88 year old man who arrives in clinic accompanied by his son. 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 Apparently noted recently to have a stage I pressure area on the left buttock. He does not have a wound history. He does have been air fluidized mattress. They have a wheelchair cushion as well as a pillow over the top of this. He is not incontinent of urine or bowel. Past medical history includes hypertension, osteoarthritis of the knee, atrial fibrillation, history of mitral valve repair, right hemisphere CVA with left hemiparesis, history of a DVT READMISSION 02/14/2022 The patient is here today for evaluation of pressure ulcers on his left elbow and possible  right buttock ulcer. Apparently when he was seen by Dr. Dellia Nims in 2020, no ulcer was appreciated on the buttock and no further wound care involvement occurred. 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 He does  have home health aides and they are turning him every 2 hours side to side. He is on a memory foam mattress at this time. 03/14/2022: The patient was scheduled to see me on July 11, but apparently his son panicked about some bright red blood per rectum and rushed him to the emergency room. There is also a comment in the electronic medical record that the patient's son thought the bone was exposed on his elbow. After waiting a prolonged period of time at the emergency department, they left without being seen. Today, it appears that the dressing on the patient's elbow was never changed since our last visit. The 2 wounds have converged creating a larger wound. According to the intake nurse, there was a foul odor from the wound and dressing. There is extensive slough on the wound surface. The bleeding per rectum turns out to be hemorrhoids. The patient's son expresses that he does not know how to manage this. 04/07/2022: For unclear reasons, the patient has not returned to clinic until they were contacted today and they made an add-on visit. The patient's son was not originally present at the beginning of the visit but showed up about 25 minutes into the visit. The patient was accompanied by a home aide. She reported that when she cleaned the patient up today, there was just a Band-Aid on his wound. She said the surface was fairly mucky but she was able to wash this off. It is abundantly clear that this wound is not being properly cared for. Apparently he did have [facility] home health but they have not been out in some time and it is not clear the reason for this. The surface of  the wound is a bit cleaner so perhaps the Santyl has been applied, but unfortunately there is now a deep tunnel that extends up from the elbow to the patient's posterior upper arm for a number centimeters. There is no evidence of infection or purulent drainage. It is also clear that the patient continues to spend a substantial portion of his time leaning on the elbow. 05/23/2022: Once again, the patient has failed to return to clinic for about 6 weeks and the reason remains unclear. He is apparently receiving home health assistance. The wound on his elbow is covered with hypertrophic granulation tissue. He also has a blister just distal to the wound on his posterior forearm. He has a new stage III pressure ulcer on his left gluteus with slough accumulation. He apparently spends a substantial portion of his days sitting in a wheelchair. I do not believe he has a Financial trader. Due to his stroke, he lists to the left side, which is how his ulcer on his elbow developed. I suspect the gluteal ulcer is as a result of this, as well. Electronic Signature(s) Signed: 05/23/2022 11:45:47 AM By: Fredirick Maudlin MD FACS Entered By: Fredirick Maudlin on 05/23/2022 11:45:47 -------------------------------------------------------------------------------- Chemical Cauterization Details Patient Name: Date of Service: [name] ID M. 05/23/2022 10:30 A M Medical Record Number: [id number] Patient Account Number: [id number] Date of Birth/Sex: Treating RN: [birth year] (88 y.o. [name] Primary Care Provider: Abelino Derrick Other Clinician: Referring Provider: Treating Provider/Extender: Sandi Raveling in Treatment: 14 Procedure Performed for: Wound #1 Left Elbow Performed By: Physician Fredirick Maudlin, MD Post Procedure Diagnosis Same as Pre-procedure Notes using silver nitrate sticks Electronic Signature(s) Signed: 05/23/2022 12:28:03 PM By: Fredirick Maudlin MD  FACS Signed: 05/23/2022 5:08:49 PM By: Baruch Gouty RN, BSN Entered  By: Baruch Gouty on 05/23/2022 11:28:11 -------------------------------------------------------------------------------- Physical Exam Details Patient Name: Date of Service: [name] ID M. 05/23/2022 10:30 A M Medical Record Number: [id number] Patient Account Number: [id number] Date of Birth/Sex: Treating RN: September 15, 1932 (88 y.o. [name] Primary Care Provider: Abelino Derrick Other Clinician: Referring Provider: Treating Provider/Extender: Sandi Raveling in Treatment: 14 Constitutional . . . . No acute distress.Marland Kitchen Respiratory Normal work of breathing on room air.. Notes 05/23/2022: The wound on his elbow is covered with hypertrophic granulation tissue. He also has a blister just distal to the wound on his posterior forearm. He has a new stage III pressure ulcer on his left gluteus with slough accumulation. Electronic Signature(s) Signed: 05/23/2022 11:47:30 AM By: Fredirick Maudlin MD FACS Entered By: Fredirick Maudlin on 05/23/2022 11:47:30 -------------------------------------------------------------------------------- Physician Orders Details Patient Name: Date of Service: [name] ID M. 05/23/2022 10:30 A M Medical Record Number: [id number] Patient Account Number: [id number] Date of Birth/Sex: Treating RN: 05/06/1933 (88 y.o. [name] Primary Care Provider: Abelino Derrick Other Clinician: Referring Provider: Treating Provider/Extender: Sandi Raveling in Treatment: [phone] Verbal / Phone Orders: No Diagnosis Coding ICD-10 Coding Code Description L89.023 Pressure ulcer of left elbow, stage 3 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 I48.19 Other persistent atrial fibrillation I25.10 Atherosclerotic heart disease of native coronary artery without angina pectoris L89.323 Pressure ulcer of left  buttock, stage 3 Follow-up Appointments Return appointment in 3 weeks. - Dr. Celine Ahr RM 4 Bathing/ Shower/ Hygiene May shower and wash wound with soap and water. - with dressing changes Off-Loading Turn and reposition every 2 hours - stay off of left elbow wear prevalon boot on elbow as directed Other: - limit time in wheelchair, reposition in wheelchair every 1-2 hours [place] #1 Left Elbow New wound care orders this week; continue Home Health for wound care. May utilize formulary equivalent dressing for wound treatment orders unless otherwise specified. - change dressings to hydrofera blue ready Other Home Health Orders/Instructions: - [facility] Wound Treatment Wound #1 - Elbow Wound Laterality: Left Cleanser: Soap and Water Every Other Day/30 Days Discharge Instructions: May shower and wash wound with dial antibacterial soap and water prior to dressing change. Cleanser: Wound Cleanser Every Other Day/30 Days Discharge Instructions: Cleanse the wound with wound cleanser prior to applying a clean dressing using gauze sponges, not tissue or cotton balls. Prim Dressing: Hydrofera Blue Ready Foam, 2.5 x2.5 in Every Other Day/30 Days ary Discharge Instructions: Apply to wound bed as instructed Secondary Dressing: ALLEVYN Gentle Border, 5x5 (in/in) (Dispense As Written) Every Other Day/30 Days Discharge Instructions: Apply over primary dressing as directed. Add-Ons: Cotton Tip Applicator, 6 (in) (Generic) Every Other Day/30 Days Wound #2 - Gluteus Wound Laterality: Left Prim Dressing: Hydrofera Blue Ready Foam, 2.5 x2.5 in Every Other Day/30 Days ary Discharge Instructions: Apply to wound bed as instructed Secondary Dressing: ALLEVYN Gentle Border, 3x3 (in/in) Every Other Day/30 Days Discharge Instructions: Apply over primary dressing as directed. Patient Medications llergies: penicillin, Novocain A Notifications Medication Indication Start End prior to debridement  05/23/2022 lidocaine DOSE topical 4 % cream - cream topical Electronic Signature(s) Signed: 05/23/2022 12:28:03 PM By: Fredirick Maudlin MD FACS Entered By: Fredirick Maudlin on 05/23/2022 11:48:54 -------------------------------------------------------------------------------- Problem List Details Patient Name: Date of Service: [name] ID M. 05/23/2022 10:30 A M Medical Record Number: [id number] Patient Account Number: [id number] Date of Birth/Sex: Treating RN: 1932/10/19 (89 y.o.  [name] Primary Care Provider: Abelino Derrick Other Clinician: Referring Provider: Treating Provider/Extender: Sandi Raveling in Treatment: 14 Active Problems ICD-10 Encounter Code Description Active Date MDM Diagnosis L89.023 Pressure ulcer of left elbow, stage 3 02/14/2022 No Yes 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 02/14/2022 No Yes I48.19 Other persistent atrial fibrillation 02/14/2022 No Yes I25.10 Atherosclerotic heart disease of native coronary artery without angina pectoris 02/14/2022 No Yes L89.323 Pressure ulcer of left buttock, stage 3 05/23/2022 No Yes Inactive Problems Resolved Problems Electronic Signature(s) Signed: 05/23/2022 11:42:02 AM By: Fredirick Maudlin MD FACS Previous Signature: 05/23/2022 11:41:32 AM Version By: Fredirick Maudlin MD FACS Entered By: Fredirick Maudlin on 05/23/2022 11:42:02 -------------------------------------------------------------------------------- Progress Note Details Patient Name: Date of Service: [name] ID M. 05/23/2022 10:30 A M Medical Record Number: [id number] Patient Account Number: [id number] Date of Birth/Sex: Treating RN: 1933/05/11 (88 y.o. [name] Primary Care Provider: Abelino Derrick Other Clinician: Referring Provider: Treating Provider/Extender: Sandi Raveling in Treatment: 14 Subjective Chief Complaint Information obtained  from Patient 08/05/2019; patient is here for review of pressure ulcers on his buttock 02/14/22: patient is here for review of pressure ulcers on his left elbow History of Present Illness (HPI) ADMISSION 08/05/19 This is an 88 year old man who arrives in clinic accompanied by his son. 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 Apparently noted recently to have a stage I pressure area on the left buttock. He does not have a wound history. He does have been air fluidized mattress. They have a wheelchair cushion as well as a pillow over the top of this. He is not incontinent of urine or bowel. Past medical history includes hypertension, osteoarthritis of the knee, atrial fibrillation, history of mitral valve repair, right hemisphere CVA with left hemiparesis, history of a DVT READMISSION 02/14/2022 The patient is here today for evaluation of pressure ulcers on his left elbow and possible right buttock ulcer. Apparently when he was seen by Dr. Dellia Nims in 2020, no ulcer was appreciated on the buttock and no further wound care involvement occurred. 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 He does have home health aides and they are turning him every 2 hours side to side. He is on a memory foam mattress at this time. 03/14/2022: The patient was scheduled to see me on July 11, but apparently his son panicked about some bright red blood per rectum and rushed him to the emergency room. There is also a comment in the electronic medical record that the patient's son thought the bone was exposed on his elbow. After waiting a prolonged period of time at the emergency department, they left without being seen. Today, it appears that the dressing on the patient's elbow was never changed since our last visit. The 2 wounds have converged creating a larger  wound. According to the intake nurse, there was a foul odor from the wound and dressing. There is extensive slough on the wound surface. The bleeding per rectum turns out to be hemorrhoids. The patient's son expresses that he does not know how to manage this. 04/07/2022: For unclear reasons, the patient has not returned to clinic until they were contacted today and they made an add-on visit. The patient's son was not originally present at the beginning of the  visit but showed up about 25 minutes into the visit. The patient was accompanied by a home aide. She reported that when she cleaned the patient up today, there was just a Band-Aid on his wound. She said the surface was fairly mucky but she was able to wash this off. It is abundantly clear that this wound is not being properly cared for. Apparently he did have [facility] home health but they have not been out in some time and it is not clear the reason for this. The surface of the wound is a bit cleaner so perhaps the Santyl has been applied, but unfortunately there is now a deep tunnel that extends up from the elbow to the patient's posterior upper arm for a number centimeters. There is no evidence of infection or purulent drainage. It is also clear that the patient continues to spend a substantial portion of his time leaning on the elbow. 05/23/2022: Once again, the patient has failed to return to clinic for about 6 weeks and the reason remains unclear. He is apparently receiving home health assistance. The wound on his elbow is covered with hypertrophic granulation tissue. He also has a blister just distal to the wound on his posterior forearm. He has a new stage III pressure ulcer on his left gluteus with slough accumulation. He apparently spends a substantial portion of his days sitting in a wheelchair. I do not believe he has a Financial trader. Due to his stroke, he lists to the left side, which is how his ulcer on his elbow developed. I suspect the  gluteal ulcer is as a result of this, as well. Patient History Information obtained from Patient. Family History Diabetes - Maternal Grandparents, Heart Disease - Mother, Hypertension - Mother, No family history of Cancer, Hereditary Spherocytosis, Kidney Disease, Lung Disease, Seizures, Stroke, Thyroid Problems, Tuberculosis. Social History Never smoker, Marital Status - Married, Alcohol Use - Never, Drug Use - No History, Caffeine Use - Never. Medical History Eyes Patient has history of Cataracts - removed Cardiovascular Patient has history of Arrhythmia - Atrial Flutter/Atrial Fib, Coronary Artery Disease, Hypotension Denies history of Hypertension Musculoskeletal Patient has history of Osteoarthritis Neurologic Denies history of Paraplegia Hospitalization/Surgery History - open heart surgery. - IR generic histoircal. - appendectomy. - hernia repair. - joint replacement. - mitral valve repair. - mvp repair. - total hip arthroplasty. Medical A Surgical History Notes nd Constitutional Symptoms (General Health) obseity Ear/Nose/Mouth/Throat wears hearing aids Cardiovascular Mitral Valve Repair, Genitourinary BPH Neurologic CVA 2018, left side hemiparesis Objective Constitutional No acute distress.. Vitals Time Taken: 10:53 AM, Height: 66 in, Weight: 185 lbs, BMI: 29.9, Temperature: 98.2 F, Pulse: 78 bpm, Respiratory Rate: 18 breaths/min, Blood Pressure: 127/75 mmHg. Respiratory Normal work of breathing on room air.. General Notes: 05/23/2022: The wound on his elbow is covered with hypertrophic granulation tissue. He also has a blister just distal to the wound on his posterior forearm. He has a new stage III pressure ulcer on his left gluteus with slough accumulation. Integumentary (Hair, Skin) Wound #1 status is Open. Original cause of wound was Pressure Injury. The date acquired was: 12/19/2021. The wound has been in treatment 14 weeks. The wound is located on the Left  Elbow. The wound measures 2.8cm length x 2cm width x 0.1cm depth; 4.398cm^2 area and 0.44cm^3 volume. There is Fat Layer (Subcutaneous Tissue) exposed. There is no tunneling or undermining noted. There is a medium amount of serosanguineous drainage noted. The wound margin is distinct with the outline attached to  the wound base. There is large (67-100%) red, hyper - granulation within the wound bed. There is a small (1-33%) amount of necrotic tissue within the wound bed including Adherent Slough. The periwound skin appearance had no abnormalities noted for texture. The periwound skin appearance had no abnormalities noted for moisture. The periwound skin appearance had no abnormalities noted for color. Periwound temperature was noted as No Abnormality. The periwound has tenderness on palpation. Wound #2 status is Open. Original cause of wound was Pressure Injury. The date acquired was: 04/21/2022. The wound is located on the Left Gluteus. The wound measures 0.9cm length x 1.4cm width x 0.1cm depth; 0.99cm^2 area and 0.099cm^3 volume. There is Fat Layer (Subcutaneous Tissue) exposed. There is no tunneling or undermining noted. There is a medium amount of serosanguineous drainage noted. The wound margin is flat and intact. There is large (67-100%) red granulation within the wound bed. There is a small (1-33%) amount of necrotic tissue within the wound bed including Adherent Slough. The periwound skin appearance had no abnormalities noted for color. The periwound skin appearance exhibited: Rash, Dry/Scaly. The periwound skin appearance did not exhibit: Maceration. Periwound temperature was noted as No Abnormality. The periwound has tenderness on palpation. Assessment Active Problems ICD-10 Pressure ulcer of left elbow, stage 3 Cerebral infarction due to thrombosis of unspecified precerebral artery Other persistent atrial fibrillation Atherosclerotic heart disease of native coronary artery without angina  pectoris Pressure ulcer of left buttock, stage 3 Procedures Wound #2 Pre-procedure diagnosis of Wound #2 is a Pressure Ulcer located on the Left Gluteus . There was a Selective/Open Wound Non-Viable Tissue Debridement with a total area of 1.26 sq cm performed by Fredirick Maudlin, MD. With the following instrument(s): Curette to remove Non-Viable tissue/material. Material removed includes [facility] after achieving pain control using Lidocaine 4% Topical Solution. No specimens were taken. A time out was conducted at 11:25, prior to the start of the procedure. A Minimum amount of bleeding was controlled with Pressure. The procedure was tolerated well with a pain level of 0 throughout and a pain level of 0 following the procedure. Post Debridement Measurements: 0.9cm length x 1.4cm width x 0.1cm depth; 0.099cm^3 volume. Post debridement Stage noted as Category/Stage III. Character of Wound/Ulcer Post Debridement is improved. Post procedure Diagnosis Wound #2: Same as Pre-Procedure General Notes: scribed by Baruch Gouty, RN for Dr. Celine Ahr. Wound #1 Pre-procedure diagnosis of Wound #1 is a Pressure Ulcer located on the Left Elbow . An Chemical Cauterization procedure was performed by Fredirick Maudlin, MD. Post procedure Diagnosis Wound #1: Same as Pre-Procedure Notes: using silver nitrate sticks Plan Follow-up Appointments: Return appointment in 3 weeks. - Dr. Celine Ahr RM 4 Bathing/ Shower/ Hygiene: May shower and wash wound with soap and water. - with dressing changes Off-Loading: Turn and reposition every 2 hours - stay off of left elbow wear prevalon boot on elbow as directed Other: - limit time in wheelchair, reposition in wheelchair every 1-2 hours Home Health: Wound #1 Left Elbow: New wound care orders this week; continue Home Health for wound care. May utilize formulary equivalent dressing for wound treatment orders unless otherwise specified. - change dressings to hydrofera blue  ready Other Home Health Orders/Instructions: - [facility] The following medication(s) was prescribed: lidocaine topical 4 % cream cream topical for prior to debridement was prescribed at facility WOUND #1: - Elbow Wound Laterality: Left Cleanser: Soap and Water Every Other Day/30 Days Discharge Instructions: May shower and wash wound with dial antibacterial soap and water prior to  dressing change. Cleanser: Wound Cleanser Every Other Day/30 Days Discharge Instructions: Cleanse the wound with wound cleanser prior to applying a clean dressing using gauze sponges, not tissue or cotton balls. Prim Dressing: Hydrofera Blue Ready Foam, 2.5 x2.5 in Every Other Day/30 Days ary Discharge Instructions: Apply to wound bed as instructed Secondary Dressing: ALLEVYN Gentle Border, 5x5 (in/in) (Dispense As Written) Every Other Day/30 Days Discharge Instructions: Apply over primary dressing as directed. Add-Ons: Cotton Tip Applicator, 6 (in) (Generic) Every Other Day/30 Days WOUND #2: - Gluteus Wound Laterality: Left Prim Dressing: Hydrofera Blue Ready Foam, 2.5 x2.5 in Every Other Day/30 Days ary Discharge Instructions: Apply to wound bed as instructed Secondary Dressing: ALLEVYN Gentle Border, 3x3 (in/in) Every Other Day/30 Days Discharge Instructions: Apply over primary dressing as directed. 05/23/2022: The wound on his elbow is covered with hypertrophic granulation tissue. He also has a blister just distal to the wound on his posterior forearm. He has a new stage III pressure ulcer on his left gluteus with slough accumulation. I unroofed the blister on his left forearm. I chemically cauterized the hypertrophic granulation tissue on his elbow using silver nitrate. I debrided slough off of his left gluteal ulcer. I am going to change his dressing to [facility] to try and combat the hypertrophic granulation tissue. We will also apply it to the gluteal ulcer. He was instructed that he needs to offload with  his gluteus and his elbow and reposition in his wheelchair every 1-2 hours. I would like to see him back in about 2 weeks, but I am honestly not sure, based on previous experience, if/when he will return. I will see him whenever that occurs. Electronic Signature(s) Signed: 05/23/2022 4:00:31 PM By: Fredirick Maudlin MD FACS Signed: 05/23/2022 5:08:49 PM By: Baruch Gouty RN, BSN Previous Signature: 05/23/2022 11:50:40 AM Version By: Fredirick Maudlin MD FACS Entered By: Baruch Gouty on 05/23/2022 15:58:59 -------------------------------------------------------------------------------- HxROS Details Patient Name: Date of Service: [name] ID M. 05/23/2022 10:30 A M Medical Record Number: [id number] Patient Account Number: [id number] Date of Birth/Sex: Treating RN: 1933/06/25 (88 y.o. [name] Primary Care Provider: Abelino Derrick Other Clinician: Referring Provider: Treating Provider/Extender: Sandi Raveling in Treatment: 14 Information Obtained From Patient Constitutional Symptoms (General Health) Medical History: Past Medical History Notes: obseity Eyes Medical History: Positive for: Cataracts - removed Ear/Nose/Mouth/Throat Medical History: Past Medical History Notes: wears hearing aids Cardiovascular Medical History: Positive for: Arrhythmia - Atrial Flutter/Atrial Fib; Coronary Artery Disease; Hypotension Negative for: Hypertension Past Medical History Notes: Mitral Valve Repair, Genitourinary Medical History: Past Medical History Notes: BPH Musculoskeletal Medical History: Positive for: Osteoarthritis Neurologic Medical History: Negative for: Paraplegia Past Medical History Notes: CVA 2018, left side hemiparesis HBO Extended History Items Eyes: Cataracts Immunizations Pneumococcal Vaccine: Received Pneumococcal Vaccination: Yes Received Pneumococcal Vaccination On or After 60th Birthday: Yes Implantable  Devices None Hospitalization / Surgery History Type of Hospitalization/Surgery open heart surgery IR generic histoircal appendectomy hernia repair joint replacement mitral valve repair mvp repair total hip arthroplasty Family and Social History Cancer: No; Diabetes: Yes - Maternal Grandparents; Heart Disease: Yes - Mother; Hereditary Spherocytosis: No; Hypertension: Yes - Mother; Kidney Disease: No; Lung Disease: No; Seizures: No; Stroke: No; Thyroid Problems: No; Tuberculosis: No; Never smoker; Marital Status - Married; Alcohol Use: Never; Drug Use: No History; Caffeine Use: Never; Financial Concerns: No; Food, Clothing or Shelter Needs: No; Support System Lacking: No; Transportation Concerns: No Electronic Signature(s) Signed: 05/23/2022 12:28:03 PM By: Fredirick Maudlin MD  FACS Signed: 05/23/2022 5:08:49 PM By: Baruch Gouty RN, BSN Entered By: Fredirick Maudlin on 05/23/2022 11:46:55 -------------------------------------------------------------------------------- SuperBill Details Patient Name: Date of Service: [name] ID M. 05/23/2022 Medical Record Number: [id number] Patient Account Number: [id number] Date of Birth/Sex: Treating RN: 08-09-1933 (89 y.o. [name] Primary Care Provider: Abelino Derrick Other Clinician: Referring Provider: Treating Provider/Extender: Sandi Raveling in Treatment: 14 Diagnosis Coding ICD-10 Codes Code Description [phone] Pressure ulcer of left elbow, stage 3 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 I48.19 Other persistent atrial fibrillation I25.10 Atherosclerotic heart disease of native coronary artery without angina pectoris L89.323 Pressure ulcer of left buttock, stage 3 Facility Procedures CPT4 Code: [id number] Description: 41638 - DEBRIDE WOUND 1ST 20 SQ CM OR < ICD-10 Diagnosis Description L89.323 Pressure ulcer of left buttock, stage 3 Modifier: Quantity:  1 CPT4 Code: [id number] Description: 32122 - CHEM CAUT GRANULATION TISS ICD-10 Diagnosis Description L89.023 Pressure ulcer of left elbow, stage 3 Modifier: Quantity: 1 Physician Procedures : CPT4 Code Description Modifier [id number] - WC PHYS LEVEL 3 - EST PT 25 ICD-10 Diagnosis Description L89.023 Pressure ulcer of left elbow, stage 3 L89.323 Pressure ulcer of left buttock, stage 3 I63.00 Cerebral infarction due to thrombosis of  unspecified precerebral artery I25.10 Atherosclerotic heart disease of native coronary artery without angina pectoris Quantity: 1 : [id number] 03888 - WC PHYS DEBR WO ANESTH 20 SQ CM ICD-10 Diagnosis Description L89.323 Pressure ulcer of left buttock, stage 3 Quantity: 1 : [id number] - WC PHYS CHEM CAUT GRAN TISSUE ICD-10 Diagnosis Description L89.023 Pressure ulcer of left elbow, stage 3 Quantity: 1 Electronic Signature(s) Signed: 05/23/2022 11:51:07 AM By: Fredirick Maudlin MD FACS Entered By: Fredirick Maudlin on 05/23/2022 11:51:07

## 2022-05-24 DIAGNOSIS — I69354 Hemiplegia and hemiparesis following cerebral infarction affecting left non-dominant side: Secondary | ICD-10-CM | POA: Diagnosis not present

## 2022-05-24 DIAGNOSIS — I4819 Other persistent atrial fibrillation: Secondary | ICD-10-CM | POA: Diagnosis not present

## 2022-05-24 DIAGNOSIS — L89023 Pressure ulcer of left elbow, stage 3: Secondary | ICD-10-CM | POA: Diagnosis not present

## 2022-05-24 DIAGNOSIS — Z7901 Long term (current) use of anticoagulants: Secondary | ICD-10-CM | POA: Diagnosis not present

## 2022-05-24 DIAGNOSIS — I2510 Atherosclerotic heart disease of native coronary artery without angina pectoris: Secondary | ICD-10-CM | POA: Diagnosis not present

## 2022-05-24 DIAGNOSIS — I10 Essential (primary) hypertension: Secondary | ICD-10-CM | POA: Diagnosis not present

## 2022-05-25 ENCOUNTER — Other Ambulatory Visit: Payer: Self-pay | Admitting: Cardiovascular Disease

## 2022-05-25 ENCOUNTER — Encounter: Payer: Self-pay | Admitting: Family Medicine

## 2022-05-25 ENCOUNTER — Telehealth: Payer: Self-pay | Admitting: Family Medicine

## 2022-05-25 DIAGNOSIS — I10 Essential (primary) hypertension: Secondary | ICD-10-CM

## 2022-05-25 NOTE — Telephone Encounter (Signed)
Caller Name: [facility] Call back phone #: [phone]  Reason for Call: OT was working with patient when she noticed what she believes to be ring worm. His back has large, circular patches that appear to be scaly. Please call with recommendations and possibly prescribe an ointment

## 2022-05-25 NOTE — Telephone Encounter (Signed)
Please advise message below will patient need to come in for evaluation?

## 2022-05-26 DIAGNOSIS — I2510 Atherosclerotic heart disease of native coronary artery without angina pectoris: Secondary | ICD-10-CM | POA: Diagnosis not present

## 2022-05-26 DIAGNOSIS — L89023 Pressure ulcer of left elbow, stage 3: Secondary | ICD-10-CM | POA: Diagnosis not present

## 2022-05-26 DIAGNOSIS — I10 Essential (primary) hypertension: Secondary | ICD-10-CM | POA: Diagnosis not present

## 2022-05-26 DIAGNOSIS — I4819 Other persistent atrial fibrillation: Secondary | ICD-10-CM | POA: Diagnosis not present

## 2022-05-26 DIAGNOSIS — I69354 Hemiplegia and hemiparesis following cerebral infarction affecting left non-dominant side: Secondary | ICD-10-CM | POA: Diagnosis not present

## 2022-05-26 DIAGNOSIS — Z7901 Long term (current) use of anticoagulants: Secondary | ICD-10-CM | POA: Diagnosis not present

## 2022-05-28 DIAGNOSIS — I69354 Hemiplegia and hemiparesis following cerebral infarction affecting left non-dominant side: Secondary | ICD-10-CM | POA: Diagnosis not present

## 2022-05-28 DIAGNOSIS — Z7901 Long term (current) use of anticoagulants: Secondary | ICD-10-CM | POA: Diagnosis not present

## 2022-05-28 DIAGNOSIS — I10 Essential (primary) hypertension: Secondary | ICD-10-CM | POA: Diagnosis not present

## 2022-05-28 DIAGNOSIS — L89023 Pressure ulcer of left elbow, stage 3: Secondary | ICD-10-CM | POA: Diagnosis not present

## 2022-05-28 DIAGNOSIS — B356 Tinea cruris: Secondary | ICD-10-CM | POA: Diagnosis not present

## 2022-05-28 DIAGNOSIS — I2510 Atherosclerotic heart disease of native coronary artery without angina pectoris: Secondary | ICD-10-CM | POA: Diagnosis not present

## 2022-05-28 DIAGNOSIS — M179 Osteoarthritis of knee, unspecified: Secondary | ICD-10-CM | POA: Diagnosis not present

## 2022-05-28 DIAGNOSIS — I4819 Other persistent atrial fibrillation: Secondary | ICD-10-CM | POA: Diagnosis not present

## 2022-05-29 ENCOUNTER — Other Ambulatory Visit: Payer: Self-pay | Admitting: Family Medicine

## 2022-05-29 DIAGNOSIS — K219 Gastro-esophageal reflux disease without esophagitis: Secondary | ICD-10-CM

## 2022-05-29 NOTE — Telephone Encounter (Signed)
Patient and pharmacy aware to try OTC cream states they will try and call us back for appointment if needed.

## 2022-05-30 ENCOUNTER — Encounter: Payer: Medicare Other | Admitting: Student in an Organized Health Care Education/Training Program

## 2022-05-30 DIAGNOSIS — N3000 Acute cystitis without hematuria: Secondary | ICD-10-CM | POA: Diagnosis not present

## 2022-05-30 DIAGNOSIS — R300 Dysuria: Secondary | ICD-10-CM | POA: Diagnosis not present

## 2022-06-01 DIAGNOSIS — I2510 Atherosclerotic heart disease of native coronary artery without angina pectoris: Secondary | ICD-10-CM | POA: Diagnosis not present

## 2022-06-01 DIAGNOSIS — I4819 Other persistent atrial fibrillation: Secondary | ICD-10-CM | POA: Diagnosis not present

## 2022-06-01 DIAGNOSIS — I10 Essential (primary) hypertension: Secondary | ICD-10-CM | POA: Diagnosis not present

## 2022-06-01 DIAGNOSIS — Z7901 Long term (current) use of anticoagulants: Secondary | ICD-10-CM | POA: Diagnosis not present

## 2022-06-01 DIAGNOSIS — L89023 Pressure ulcer of left elbow, stage 3: Secondary | ICD-10-CM | POA: Diagnosis not present

## 2022-06-01 DIAGNOSIS — I69354 Hemiplegia and hemiparesis following cerebral infarction affecting left non-dominant side: Secondary | ICD-10-CM | POA: Diagnosis not present

## 2022-06-06 ENCOUNTER — Encounter: Payer: Medicare Other | Admitting: Student in an Organized Health Care Education/Training Program

## 2022-06-06 ENCOUNTER — Encounter: Payer: Self-pay | Admitting: Student in an Organized Health Care Education/Training Program

## 2022-06-06 ENCOUNTER — Ambulatory Visit
Payer: Medicare Other | Attending: Student in an Organized Health Care Education/Training Program | Admitting: Student in an Organized Health Care Education/Training Program

## 2022-06-06 VITALS — BP 123/46 | HR 61 | Temp 97.0°F | Resp 16 | Ht 66.0 in | Wt 185.0 lb

## 2022-06-06 DIAGNOSIS — G8929 Other chronic pain: Secondary | ICD-10-CM | POA: Insufficient documentation

## 2022-06-06 DIAGNOSIS — M5416 Radiculopathy, lumbar region: Secondary | ICD-10-CM | POA: Insufficient documentation

## 2022-06-06 DIAGNOSIS — M47816 Spondylosis without myelopathy or radiculopathy, lumbar region: Secondary | ICD-10-CM | POA: Diagnosis not present

## 2022-06-06 DIAGNOSIS — G894 Chronic pain syndrome: Secondary | ICD-10-CM | POA: Diagnosis not present

## 2022-06-06 DIAGNOSIS — M5136 Other intervertebral disc degeneration, lumbar region: Secondary | ICD-10-CM | POA: Insufficient documentation

## 2022-06-06 DIAGNOSIS — M170 Bilateral primary osteoarthritis of knee: Secondary | ICD-10-CM | POA: Diagnosis not present

## 2022-06-06 DIAGNOSIS — Z0289 Encounter for other administrative examinations: Secondary | ICD-10-CM | POA: Diagnosis not present

## 2022-06-06 DIAGNOSIS — M1711 Unilateral primary osteoarthritis, right knee: Secondary | ICD-10-CM | POA: Insufficient documentation

## 2022-06-06 MED ORDER — PREGABALIN 25 MG PO CAPS: 25.0000 mg | ORAL_CAPSULE | Freq: Every day | ORAL | 5 refills | Status: DC

## 2022-06-06 MED ORDER — OXYCODONE-ACETAMINOPHEN 7.5-325 MG PO TABS: 1.0000 | ORAL_TABLET | Freq: Two times a day (BID) | ORAL | 0 refills | Status: DC | PRN

## 2022-06-06 MED ORDER — OXYCODONE-ACETAMINOPHEN 7.5-325 MG PO TABS: 1.0000 | ORAL_TABLET | Freq: Two times a day (BID) | ORAL | 0 refills | Status: AC | PRN

## 2022-06-06 NOTE — Progress Notes (Signed)
PROVIDER NOTE: Information contained herein reflects review and annotations entered in association with encounter. Interpretation of such information and data should be left to medically-trained personnel. Information provided to patient can be located elsewhere in the medical record under "Patient Instructions". Document created using STT-dictation technology, any transcriptional errors that may result from process are unintentional.    Patient: Brian [name]  Service Category: E/M  Provider: Gillis Santa, MD  DOB: 07-May-1933  DOS: 06/06/2022  Referring Provider: Libby Maw  MRN: [id number]  Specialty: Interventional Pain Management  PCP: Libby Maw, MD  Type: Established Patient  Setting: Ambulatory outpatient    Location: Office  Delivery: Face-to-face     HPI  [name], a 88 y.o. year old male, is here today because of his Chronic radicular lumbar pain [M54.16, G89.29]. [name] primary complain today is Shoulder Pain (bilateral), Knee Pain (bilateral), and Elbow Pain Last encounter: My last encounter with him was on 04/11/2022. Pertinent problems: Brian [name] has Dysarthria, post-stroke; Chronic radicular lumbar pain; Gait disturbance; Paraplegic immobility syndrome; History of cerebrovascular accident; Depression with anxiety; Lumbar spondylosis; Lumbar degenerative disc disease; Bilateral primary osteoarthritis of knee; Pain management contract signed; and Chronic pain syndrome on their pertinent problem list. Pain Assessment: Severity of Chronic pain is reported as a 5 /10. Location: Shoulder Right, Left/radiates down left arm to hand. Onset: More than a month ago. Quality: Numbness, Aching. Timing: Constant. Modifying factor(s): medications. Vitals:  height is '5\' 6"'  (1.676 m) and weight is 185 lb (83.9 kg). His temperature is 97 F (36.1 C) (abnormal). His blood pressure is 123/46 (abnormal) and his pulse is 61. His respiration is 16 and oxygen saturation  is 95%.   Reason for encounter: medication management.   No significant change in Brian [name] history.  Is noticing analgesic and functional benefit with Percocet that he takes once in the morning and once in the evening after a meal along with Lyrica 25 mg which he takes at night.  No cognitive side effects, gait disturbances, GI side effects.  We will continue as prescribed.  Patient's son and home caregiver/CMA in room.  04/11/22: 2nd pt visit No significant change in his medical history.  Patient unable to submit a urine toxicology screen so we obtained a serum drug screen. We will have patient sign pain contract.  He was accompanied today by his son and their CMA Prescription for Percocet as below 7.5 mg twice daily as needed which was being managed by their primary care provider, Dr. Ethelene Hal Also recommend restarting Lyrica which the patient was on before, 25 mg nightly as a pain adjunct.   HPI from initial clinic visit: Brian [name] is a pleasant 88 year old male who is accompanied today by his son and his nursing aide.  He has a longstanding history of diffuse arthritis most pronounced in his glenohumeral joints and knees.  He has tried physical therapy and continues to engage in participate in physical therapy twice a week.  He has a history of stroke with associated hemiparesis.  He has significant thoracic kyphosis.  He has difficulty ambulating and presents today in a wheelchair.  He also has bilateral hip osteoarthritis.  He was previously on Percocet 5 mg twice a day which became less effective and as a result, his primary care provider increased it to 7.5 mg twice a day which he is not getting any benefit from.  He is currently anticoagulated on Eliquis for his coronary artery disease.  He is  also on Lyrica 25 mg twice a day.  He is also on Seroquel and Zoloft for depression management.    Pharmacotherapy Assessment  Analgesic:  05/17/2022  04/11/2022   1  Oxycodone-Acetaminophn 7.5-325  60.00  30  Bi Lat  [id number]   [facility] (8425)  0/0  22.50 MME  Medicare  [place]     Monitoring: [place] PMP: PDMP reviewed during this encounter.       Pharmacotherapy: No side-effects or adverse reactions reported. Compliance: No problems identified. Effectiveness: Clinically acceptable.  Dewayne Shorter, RN  06/06/2022  1:04 PM  Sign when Signing Visit Nursing Pain Medication Assessment:  Safety precautions to be maintained throughout the outpatient stay will include: orient to surroundings, keep bed in low position, maintain call bell within reach at all times, provide assistance with transfer out of bed and ambulation.  Medication Inspection Compliance: Brian [name] did not comply with our request to bring his pills to be counted. He was reminded that bringing the medication bottles, even when empty, is a requirement.  Medication: None brought in. Pill/Patch Count: None available to be counted. Bottle Appearance: No container available. Did not bring bottle(s) to appointment. Filled Date: N/A Last Medication intake:  Today    No results found for: "CBDTHCR" No results found for: "D8THCCBX" No results found for: "D9THCCBX"  UDS:  Summary  Date Value Ref Range Status  12/27/2021 Note  Final    Comment:    ==================================================================== Compliance Drug Analysis, Ur ==================================================================== Test                             Result       Flag       Units  Drug Present and Declared for Prescription Verification   Oxycodone                      2038         EXPECTED   ng/mg creat   Oxymorphone                    6930         EXPECTED   ng/mg creat   Noroxycodone                   746          EXPECTED   ng/mg creat   Noroxymorphone                 488          EXPECTED   ng/mg creat    Sources of oxycodone are scheduled prescription medications.    Oxymorphone, noroxycodone, and noroxymorphone are expected     metabolites of oxycodone. Oxymorphone is also available as a    scheduled prescription medication.    Pregabalin                     PRESENT      EXPECTED   Sertraline                     PRESENT      EXPECTED   Desmethylsertraline            PRESENT      EXPECTED    Desmethylsertraline is an expected metabolite of sertraline.    Quetiapine  PRESENT      EXPECTED   Acetaminophen                  PRESENT      EXPECTED  Drug Absent but Declared for Prescription Verification   Alprazolam                     Not Detected UNEXPECTED ng/mg creat   Metoprolol                     Not Detected UNEXPECTED ==================================================================== Test                      Result    Flag   Units      Ref Range   Creatinine              100              mg/dL      >=20 ==================================================================== Declared Medications:  The flagging and interpretation on this report are based on the  following declared medications.  Unexpected results may arise from  inaccuracies in the declared medications.   **Note: The testing scope of this panel includes these medications:   Alprazolam (Xanax)  Metoprolol  Oxycodone (Percocet)  Pregabalin (Lyrica)  Quetiapine (Seroquel)  Sertraline (Zoloft)   **Note: The testing scope of this panel does not include small to  moderate amounts of these reported medications:   Acetaminophen (Tylenol)  Acetaminophen (Percocet)   **Note: The testing scope of this panel does not include the  following reported medications:   Apixaban (Eliquis)  Cetirizine (Zyrtec)  Finasteride (Proscar)  Losartan (Cozaar)  Multivitamin  Pantoprazole (Protonix)  Polyethylene Glycol (MiraLAX)  Pravastatin  Tamsulosin (Flomax) ==================================================================== For clinical consultation, please call [phone]. ====================================================================       ROS  Constitutional: Denies any fever or chills Gastrointestinal: No reported hemesis, hematochezia, vomiting, or acute GI distress Musculoskeletal:  Diffuse musculoskeletal pain Neurological:  Dysarthria and dysphagia from previous stroke  Medication Review  ALPRAZolam, Mupirocin, QUEtiapine, apixaban, cetirizine, finasteride, fluticasone, hydrocortisone ointment, melatonin, metoprolol succinate, multivitamin, oxyCODONE-acetaminophen, pantoprazole, polyethylene glycol powder, pravastatin, pregabalin, sertraline, and tamsulosin  History Review  Allergy: Brian [name] is allergic to novocain [procaine hcl], other, and penicillins. Drug: Brian [name]  reports no history of drug use. Alcohol:  reports no history of alcohol use. Tobacco:  reports that he has quit smoking. He has never used smokeless tobacco. Social: Brian [name]  reports that he has quit smoking. He has never used smokeless tobacco. He reports that he does not drink alcohol and does not use drugs. Medical:  has a past medical history of Anemia, Atrial flutter ([place]), BPH (benign prostatic hypertrophy), Epistaxis, Hemorrhage of gastrointestinal tract, unspecified, History of open heart surgery, Hypertension, Obesity (BMI 30.0-34.9) (06/29/2020), Osteoarthrosis, unspecified whether generalized or localized, unspecified site, Personal history of venous thrombosis and embolism, Rosacea, Stroke ([place]), and TIA (transient ischemic attack). Surgical: Brian [name]  has a past surgical history that includes Appendectomy; Hernia repair; Total hip arthroplasty; Joint replacement; mvp repair; Mitral valve repair; Radiology with anesthesia (N/A, 05/04/2016); ir generic historical (05/04/2016); ir generic historical (05/04/2016); and ir generic historical (06/07/2016). Family: family history includes Coronary artery disease in his father; Rheumatic fever in his  mother.  Laboratory Chemistry Profile   Renal Lab Results  Component Value Date   BUN 18 03/14/2022   CREATININE 0.98 03/14/2022   BCR 19 02/06/2020   GFR 84.36  11/01/2021   GFRAA >60 11/03/2018   GFRNONAA >60 03/14/2022    Hepatic Lab Results  Component Value Date   AST 25 03/14/2022   ALT 16 03/14/2022   ALBUMIN 3.2 (L) 03/14/2022   ALKPHOS 68 03/14/2022   LIPASE 23 03/14/2022    Electrolytes Lab Results  Component Value Date   NA 137 03/14/2022   K 3.7 03/14/2022   CL 104 03/14/2022   CALCIUM 8.8 (L) 03/14/2022   MG 1.7 03/15/2017   PHOS 2.4 (L) 05/07/2016    Bone No results found for: "VD25OH", "VD125OH2TOT", "VO1607PX1", "GG2694WN4", "25OHVITD1", "25OHVITD2", "25OHVITD3", "TESTOFREE", "TESTOSTERONE"  Inflammation (CRP: Acute Phase) (ESR: Chronic Phase) Lab Results  Component Value Date   LATICACIDVEN 1.9 03/15/2022         Note: Above Lab results reviewed.  Recent Imaging Review  ECHOCARDIOGRAM COMPLETE    ECHOCARDIOGRAM REPORT       Patient Name:   Richards MAXIMUM REILAND Date of Exam: 03/15/2022 Medical Rec #:  [id number]      Height:       68.0 in Accession #:    [id number]     Weight:       185.0 lb Date of Birth:  17-Nov-1932      BSA:          1.977 m Patient Age:    89 years       BP:           125/64 mmHg Patient Gender: M              HR:           98 bpm. Exam Location:  Inpatient  Procedure: 2D Echo, Cardiac Doppler and Color Doppler                                MODIFIED REPORT: This report was modified by Cherlynn Kaiser MD on 03/15/2022 due to revision.  Indications:     Syncope   History:         Patient has no prior history of Echocardiogram examinations.                  CAD, Arrythmias:Atrial Fibrillation; Risk Factors:Hypertension.                    Mitral Valve: unknown size prosthetic annuloplasty ring valve                  is present in the mitral position. Procedure Date: 2002 [facility].   Sonographer:     Jyl Heinz Referring Phys:   [id number] [place] Diagnosing Phys: Cherlynn Kaiser MD  IMPRESSIONS   1. Left ventricular ejection fraction, by estimation, is 55 to 60%. The left ventricle has normal function. The left ventricle has no regional wall motion abnormalities. There is mild left ventricular hypertrophy. Left ventricular diastolic parameters  are indeterminate.  2. Right ventricular systolic function is normal. The right ventricular size is mildly enlarged. There is moderately elevated pulmonary artery systolic pressure. The estimated right ventricular systolic pressure is 81.0 mmHg.  3. Left atrial size was severely dilated.  4. Right atrial size was severely dilated.  5. The mitral valve has been repaired/replaced. Mild mitral valve regurgitation. The mean mitral valve gradient is 3.6 mmHg with average heart rate of 70 bpm. There is a unknown size prosthetic annuloplasty ring present in the mitral position. Procedure  Date: 2002  [facility]. Echo findings are consistent with normal structure and function of the mitral valve prosthesis.  6. Tricuspid valve regurgitation is severe.  7. The aortic valve is abnormal. There is mild calcification of the aortic valve. Aortic valve regurgitation is moderate. No aortic stenosis is present.  8. The inferior vena cava is dilated in size with <50% respiratory variability, suggesting right atrial pressure of 15 mmHg.  FINDINGS  Left Ventricle: Left ventricular ejection fraction, by estimation, is 55 to 60%. The left ventricle has normal function. The left ventricle has no regional wall motion abnormalities. The left ventricular internal cavity size was normal in size. There is  mild left ventricular hypertrophy. Abnormal (paradoxical) septal motion consistent with post-operative status. Left ventricular diastolic parameters are indeterminate.  Right Ventricle: The right ventricular size is mildly enlarged. No increase in right ventricular wall thickness. Right ventricular systolic  function is normal. There is moderately elevated pulmonary artery systolic pressure. The tricuspid regurgitant  velocity is 2.89 m/s, and with an assumed right atrial pressure of 15 mmHg, the estimated right ventricular systolic pressure is 27.0 mmHg.  Left Atrium: Left atrial size was severely dilated.  Right Atrium: Right atrial size was severely dilated.  Pericardium: There is no evidence of pericardial effusion.  Mitral Valve: The mitral valve has been repaired/replaced. Mild mitral valve regurgitation. There is a unknown size prosthetic annuloplasty ring present in the mitral position. Procedure Date: 2002 [facility]. Echo findings are consistent with normal structure  and function of the mitral valve prosthesis. The mean mitral valve gradient is 3.6 mmHg with average heart rate of 70 bpm.  Tricuspid Valve: The tricuspid valve is normal in structure. Tricuspid valve regurgitation is severe. No evidence of tricuspid stenosis.  Aortic Valve: The aortic valve is abnormal. There is mild calcification of the aortic valve. Aortic valve regurgitation is moderate. Aortic regurgitation PHT measures 558 msec. No aortic stenosis is present. Aortic valve peak gradient measures 9.5 mmHg.  Pulmonic Valve: The pulmonic valve was normal in structure. Pulmonic valve regurgitation is mild. No evidence of pulmonic stenosis.  Aorta: The aortic root is normal in size and structure. Ascending aorta measurements are within normal limits for age when indexed to body surface area.  Venous: The inferior vena cava is dilated in size with less than 50% respiratory variability, suggesting right atrial pressure of 15 mmHg.  IAS/Shunts: No atrial level shunt detected by color flow Doppler.    LEFT VENTRICLE PLAX 2D LVIDd:         3.90 cm      Diastology LVIDs:         3.00 cm      LV e' medial:    6.31 cm/s LV PW:         1.10 cm      LV E/e' medial:  25.0 LV IVS:        1.10 cm      LV e' lateral:   7.51 cm/s LVOT  diam:     2.20 cm      LV E/e' lateral: 21.0 LV SV:         72 LV SV Index:   36 LVOT Area:     3.80 cm   LV Volumes (MOD) LV vol d, MOD A2C: 107.0 ml LV vol d, MOD A4C: 120.0 ml LV vol s, MOD A2C: 45.4 ml LV vol s, MOD A4C: 49.9 ml LV SV MOD A2C:     61.6 ml LV SV MOD A4C:  120.0 ml LV SV MOD BP:      68.1 ml  RIGHT VENTRICLE            IVC RV Basal diam:  4.30 cm    IVC diam: 2.20 cm RV S prime:     9.25 cm/s TAPSE (M-mode): 1.4 cm  LEFT ATRIUM             Index        RIGHT ATRIUM           Index LA diam:        4.50 cm 2.28 cm/m   RA Area:     18.70 cm LA Vol (A2C):   89.3 ml 45.17 ml/m  RA Volume:   48.60 ml  24.58 ml/m LA Vol (A4C):   72.5 ml 36.67 ml/m LA Biplane Vol: 85.0 ml 42.99 ml/m  AORTIC VALVE AV Area (Vmax): 2.57 cm AV Vmax:        154.00 cm/s AV Peak Grad:   9.5 mmHg LVOT Vmax:      104.00 cm/s LVOT Vmean:     72.400 cm/s LVOT VTI:       0.189 m AI PHT:         558 msec   AORTA Ao Root diam: 3.70 cm Ao Asc diam:  3.40 cm  MITRAL VALVE                TRICUSPID VALVE MV Area (PHT): 5.09 cm     TR Peak grad:   33.4 mmHg MV Mean grad:  3.6 mmHg     TR Vmax:        289.00 cm/s MV Decel Time: 149 msec MV E velocity: 158.00 cm/s  SHUNTS                             Systemic VTI:  0.19 m                             Systemic Diam: 2.20 cm  Cherlynn Kaiser MD Electronically signed by Cherlynn Kaiser MD Signature Date/Time: 03/15/2022/4:12:04 PM      Final (Updated)   CT ABDOMEN PELVIS W CONTRAST CLINICAL DATA:  Syncope, hypotension, abdominal pain.  EXAM: CT ANGIOGRAPHY CHEST  CT ABDOMEN AND PELVIS WITH CONTRAST  TECHNIQUE: Multidetector CT imaging of the chest was performed using the standard protocol during bolus administration of intravenous contrast. Multiplanar CT image reconstructions and MIPs were obtained to evaluate the vascular anatomy. Multidetector CT imaging of the abdomen and pelvis was performed using the standard  protocol during bolus administration of intravenous contrast.  RADIATION DOSE REDUCTION: This exam was performed according to the departmental dose-optimization program which includes automated exposure control, adjustment of the mA and/or kV according to patient size and/or use of iterative reconstruction technique.  CONTRAST:  129m OMNIPAQUE IOHEXOL 350 MG/ML SOLN  COMPARISON:  Chest radiograph dated 03/14/2022  FINDINGS: CTA CHEST FINDINGS  Cardiovascular: Satisfactory opacification of the bilateral pulmonary arteries to the segmental level. No evidence of pulmonary embolism.  Study is not tailored for evaluation of the thoracic aorta. No evidence of thoracic aortic aneurysm. Atherosclerotic calcifications of the arch.  Cardiomegaly.  No pericardial effusion.  Prosthetic mitral valve.  Coronary atherosclerosis of the LAD.  Mediastinum/Nodes: No suspicious mediastinal lymphadenopathy.  Visualized thyroid is unremarkable.  Lungs/Pleura: Eventration of left hemidiaphragm with associated lingular and left lower lobe atelectasis.  Mild atelectasis in the posterior upper lobes and medial right lower lobe.  No focal consolidation.  Mild centrilobular and paraseptal emphysematous changes, upper lung predominant.  Evaluation lung parenchyma is constrained by respiratory motion. Within that constraint, there are no suspicious pulmonary nodules.  No pleural effusion or pneumothorax.  Musculoskeletal: Mild degenerative changes of the lower thoracic spine. Vertebral hemangioma at T3. Median sternotomy.  Review of the MIP images confirms the above findings.  CT ABDOMEN and PELVIS FINDINGS  Hepatobiliary: Scattered hepatic cysts measuring up to 19 mm in segment 4.  Gallbladder is unremarkable. No intrahepatic or extrahepatic ductal dilatation.  Pancreas: Mild parenchymal atrophy.  Spleen: Within normal limits.  Adrenals/Urinary Tract: Adrenal glands are within  normal limits.  Bilateral renal cysts, measuring up to 3.2 cm in the right upper kidney (series 5/image 23), benign (Bosniak II). No hydronephrosis.  Bladder is within normal limits, noting streak artifact.  Stomach/Bowel: Stomach is within normal limits.  No evidence of bowel obstruction.  Appendix is not discretely visualized.  Scattered colonic diverticulosis, without evidence of diverticulitis.  Vascular/Lymphatic: No evidence of abdominal aortic aneurysm.  Atherosclerotic calcifications of the abdominal aorta and branch vessels.  No suspicious abdominopelvic lymphadenopathy.  Reproductive: Prostatomegaly, suggesting BPH.  Other: No abdominopelvic ascites.  Mild presacral fluid/stranding (series 3/image 63).  Musculoskeletal: Right hip arthroplasty, without evidence of complication. Degenerative changes of the visualized thoracolumbar spine. Median sternotomy.  Review of the MIP images confirms the above findings.  IMPRESSION: No evidence of pulmonary embolism. No evidence of acute cardiopulmonary disease.  No acute findings in the abdomen/pelvis.  Additional ancillary findings as above.  Electronically Signed   By: Julian Hy M.D.   On: 03/15/2022 00:39 CT Angio Chest PE W and/or Wo Contrast CLINICAL DATA:  Syncope, hypotension, abdominal pain.  EXAM: CT ANGIOGRAPHY CHEST  CT ABDOMEN AND PELVIS WITH CONTRAST  TECHNIQUE: Multidetector CT imaging of the chest was performed using the standard protocol during bolus administration of intravenous contrast. Multiplanar CT image reconstructions and MIPs were obtained to evaluate the vascular anatomy. Multidetector CT imaging of the abdomen and pelvis was performed using the standard protocol during bolus administration of intravenous contrast.  RADIATION DOSE REDUCTION: This exam was performed according to the departmental dose-optimization program which includes automated exposure control,  adjustment of the mA and/or kV according to patient size and/or use of iterative reconstruction technique.  CONTRAST:  171m OMNIPAQUE IOHEXOL 350 MG/ML SOLN  COMPARISON:  Chest radiograph dated 03/14/2022  FINDINGS: CTA CHEST FINDINGS  Cardiovascular: Satisfactory opacification of the bilateral pulmonary arteries to the segmental level. No evidence of pulmonary embolism.  Study is not tailored for evaluation of the thoracic aorta. No evidence of thoracic aortic aneurysm. Atherosclerotic calcifications of the arch.  Cardiomegaly.  No pericardial effusion.  Prosthetic mitral valve.  Coronary atherosclerosis of the LAD.  Mediastinum/Nodes: No suspicious mediastinal lymphadenopathy.  Visualized thyroid is unremarkable.  Lungs/Pleura: Eventration of left hemidiaphragm with associated lingular and left lower lobe atelectasis.  Mild atelectasis in the posterior upper lobes and medial right lower lobe.  No focal consolidation.  Mild centrilobular and paraseptal emphysematous changes, upper lung predominant.  Evaluation lung parenchyma is constrained by respiratory motion. Within that constraint, there are no suspicious pulmonary nodules.  No pleural effusion or pneumothorax.  Musculoskeletal: Mild degenerative changes of the lower thoracic spine. Vertebral hemangioma at T3. Median sternotomy.  Review of the MIP images confirms the above findings.  CT ABDOMEN and PELVIS FINDINGS  Hepatobiliary: Scattered hepatic cysts measuring up  to 19 mm in segment 4.  Gallbladder is unremarkable. No intrahepatic or extrahepatic ductal dilatation.  Pancreas: Mild parenchymal atrophy.  Spleen: Within normal limits.  Adrenals/Urinary Tract: Adrenal glands are within normal limits.  Bilateral renal cysts, measuring up to 3.2 cm in the right upper kidney (series 5/image 23), benign (Bosniak II). No hydronephrosis.  Bladder is within normal limits, noting streak  artifact.  Stomach/Bowel: Stomach is within normal limits.  No evidence of bowel obstruction.  Appendix is not discretely visualized.  Scattered colonic diverticulosis, without evidence of diverticulitis.  Vascular/Lymphatic: No evidence of abdominal aortic aneurysm.  Atherosclerotic calcifications of the abdominal aorta and branch vessels.  No suspicious abdominopelvic lymphadenopathy.  Reproductive: Prostatomegaly, suggesting BPH.  Other: No abdominopelvic ascites.  Mild presacral fluid/stranding (series 3/image 63).  Musculoskeletal: Right hip arthroplasty, without evidence of complication. Degenerative changes of the visualized thoracolumbar spine. Median sternotomy.  Review of the MIP images confirms the above findings.  IMPRESSION: No evidence of pulmonary embolism. No evidence of acute cardiopulmonary disease.  No acute findings in the abdomen/pelvis.  Additional ancillary findings as above.  Electronically Signed   By: Julian Hy M.D.   On: 03/15/2022 00:39 CT Head Wo Contrast CLINICAL DATA:  Mental status change  EXAM: CT HEAD WITHOUT CONTRAST  TECHNIQUE: Contiguous axial images were obtained from the base of the skull through the vertex without intravenous contrast.  RADIATION DOSE REDUCTION: This exam was performed according to the departmental dose-optimization program which includes automated exposure control, adjustment of the mA and/or kV according to patient size and/or use of iterative reconstruction technique.  COMPARISON:  CT brain 10/31/2017 report, 05/10/2016  FINDINGS: Brain: No acute territorial infarction, hemorrhage or intracranial mass. Chronic right MCA infarct with encephalomalacia and ex vacuo dilatation of the right ventricle. Small chronic appearing infarct within the left posterior parietal white matter. Atrophy. Mild chronic small vessel ischemic changes of the white matter.  Vascular: No hyperdense vessels.  No  unexpected calcification  Skull: Normal. Negative for fracture or focal lesion.  Sinuses/Orbits: No acute finding.  Other: None.  IMPRESSION: 1. No CT evidence for acute intracranial abnormality. 2. Chronic right MCA infarct. Atrophy and mild chronic small vessel ischemic changes of the white matter.  Electronically Signed   By: Donavan Foil M.D.   On: 03/15/2022 00:14 Note: Reviewed        Physical Exam  General appearance: Well nourished, well developed, and well hydrated. In no apparent acute distress Mental status: Alert, oriented x 3 (person, place, & time)       Respiratory: No evidence of acute respiratory distress Eyes: PERLA Vitals: BP (!) 123/46   Pulse 61   Temp (!) 97 F (36.1 C)   Resp 16   Ht '5\' 6"'  (1.676 m)   Wt 185 lb (83.9 kg)   SpO2 95%   BMI 29.86 kg/m  BMI: Estimated body mass index is 29.86 kg/m as calculated from the following:   Height as of this encounter: '5\' 6"'  (1.676 m).   Weight as of this encounter: 185 lb (83.9 kg). Ideal: Ideal body weight: 63.8 kg (140 lb 10.5 oz) Adjusted ideal body weight: 71.8 kg (158 lb 6.3 oz)  Patient is severely deconditioned, severe thoracic kyphosis and scoliosis.  Presents in wheelchair. Severe deconditioning.  Assessment   Diagnosis Status  1. Chronic radicular lumbar pain   2. Lumbar spondylosis   3. Lumbar degenerative disc disease   4. Bilateral primary osteoarthritis of knee   5. Pain management  contract signed   6. Primary osteoarthritis of right knee   7. Chronic pain syndrome    Controlled Controlled Controlled   Updated Problems: Problem  Lumbar Spondylosis  Lumbar Degenerative Disc Disease  Bilateral Primary Osteoarthritis of Knee  Pain Management Contract Signed  Chronic Pain Syndrome  Depression With Anxiety  Paraplegic Immobility Syndrome  Gait Disturbance  Chronic Radicular Lumbar Pain  History of Cerebrovascular Accident  Dysarthria, Post-Stroke     Plan of Care   Problem-specific:  No problem-specific Assessment & Plan notes found for this encounter.  Mr. ESSAM [name] has a current medication list which includes the following long-term medication(s): apixaban, cetirizine, metoprolol succinate, pantoprazole, pravastatin, quetiapine, sertraline, and pregabalin.  Pharmacotherapy (Medications Ordered): Meds ordered this encounter  Medications   oxyCODONE-acetaminophen (PERCOCET) 7.5-325 MG tablet    Sig: Take 1 tablet by mouth every 12 (twelve) hours as needed for severe pain.    Dispense:  60 tablet    Refill:  0   oxyCODONE-acetaminophen (PERCOCET) 7.5-325 MG tablet    Sig: Take 1 tablet by mouth every 12 (twelve) hours as needed for severe pain.    Dispense:  60 tablet    Refill:  0   oxyCODONE-acetaminophen (PERCOCET) 7.5-325 MG tablet    Sig: Take 1 tablet by mouth every 12 (twelve) hours as needed for severe pain.    Dispense:  60 tablet    Refill:  0   pregabalin (LYRICA) 25 MG capsule    Sig: Take 1 capsule (25 mg total) by mouth at bedtime.    Dispense:  30 capsule    Refill:  5   Orders:  No orders of the defined types were placed in this encounter.  Follow-up plan:   Return in about 13 weeks (around 09/05/2022) for Medication Management, in person.    Recent Visits Date Type Provider Dept  04/11/22 Office Visit Gillis Santa, MD [facility]  Showing recent visits within past 90 days and meeting all other requirements Today's Visits Date Type Provider Dept  06/06/22 Office Visit Gillis Santa, MD [facility]  Showing today's visits and meeting all other requirements Future Appointments No visits were found meeting these conditions. Showing future appointments within next 90 days and meeting all other requirements  I discussed the assessment and treatment plan with the patient. The patient was provided an opportunity to ask questions and all were answered. The patient agreed with the plan and demonstrated  an understanding of the instructions.  Patient advised to call back or seek an in-person evaluation if the symptoms or condition worsens.  Duration of encounter: 19mnutes.  Total time on encounter, as per AMA guidelines included both the face-to-face and non-face-to-face time personally spent by the physician and/or other qualified health care professional(s) on the day of the encounter (includes time in activities that require the physician or other qualified health care professional and does not include time in activities normally performed by clinical staff). Physician's time may include the following activities when performed: preparing to see the patient (eg, review of tests, pre-charting review of records) obtaining and/or reviewing separately obtained history performing a medically appropriate examination and/or evaluation counseling and educating the patient/family/caregiver ordering medications, tests, or procedures referring and communicating with other health care professionals (when not separately reported) documenting clinical information in the electronic or other health record independently interpreting results (not separately reported) and communicating results to the patient/ family/caregiver care coordination (not separately reported)  Note by: BGillis Santa MD Date:  06/06/2022; Time: 1:32 PM

## 2022-06-06 NOTE — Progress Notes (Signed)
Nursing Pain Medication Assessment:  Safety precautions to be maintained throughout the outpatient stay will include: orient to surroundings, keep bed in low position, maintain call bell within reach at all times, provide assistance with transfer out of bed and ambulation.  Medication Inspection Compliance: Mr. Goldenstein did not comply with our request to bring his pills to be counted. He was reminded that bringing the medication bottles, even when empty, is a requirement.  Medication: None brought in. Pill/Patch Count: None available to be counted. Bottle Appearance: No container available. Did not bring bottle(s) to appointment. Filled Date: N/A Last Medication intake:  Today 

## 2022-06-07 DIAGNOSIS — I4819 Other persistent atrial fibrillation: Secondary | ICD-10-CM | POA: Diagnosis not present

## 2022-06-07 DIAGNOSIS — L89023 Pressure ulcer of left elbow, stage 3: Secondary | ICD-10-CM | POA: Diagnosis not present

## 2022-06-07 DIAGNOSIS — I69354 Hemiplegia and hemiparesis following cerebral infarction affecting left non-dominant side: Secondary | ICD-10-CM | POA: Diagnosis not present

## 2022-06-07 DIAGNOSIS — Z23 Encounter for immunization: Secondary | ICD-10-CM | POA: Diagnosis not present

## 2022-06-07 DIAGNOSIS — Z7901 Long term (current) use of anticoagulants: Secondary | ICD-10-CM | POA: Diagnosis not present

## 2022-06-07 DIAGNOSIS — I2510 Atherosclerotic heart disease of native coronary artery without angina pectoris: Secondary | ICD-10-CM | POA: Diagnosis not present

## 2022-06-07 DIAGNOSIS — I10 Essential (primary) hypertension: Secondary | ICD-10-CM | POA: Diagnosis not present

## 2022-06-10 DIAGNOSIS — I69354 Hemiplegia and hemiparesis following cerebral infarction affecting left non-dominant side: Secondary | ICD-10-CM | POA: Diagnosis not present

## 2022-06-10 DIAGNOSIS — I4819 Other persistent atrial fibrillation: Secondary | ICD-10-CM | POA: Diagnosis not present

## 2022-06-10 DIAGNOSIS — Z7901 Long term (current) use of anticoagulants: Secondary | ICD-10-CM | POA: Diagnosis not present

## 2022-06-10 DIAGNOSIS — I10 Essential (primary) hypertension: Secondary | ICD-10-CM | POA: Diagnosis not present

## 2022-06-10 DIAGNOSIS — I2510 Atherosclerotic heart disease of native coronary artery without angina pectoris: Secondary | ICD-10-CM | POA: Diagnosis not present

## 2022-06-10 DIAGNOSIS — L89023 Pressure ulcer of left elbow, stage 3: Secondary | ICD-10-CM | POA: Diagnosis not present

## 2022-06-13 ENCOUNTER — Encounter (HOSPITAL_BASED_OUTPATIENT_CLINIC_OR_DEPARTMENT_OTHER): Payer: Medicare Other | Admitting: General Surgery

## 2022-06-13 DIAGNOSIS — L89323 Pressure ulcer of left buttock, stage 3: Secondary | ICD-10-CM | POA: Diagnosis not present

## 2022-06-13 DIAGNOSIS — M1990 Unspecified osteoarthritis, unspecified site: Secondary | ICD-10-CM | POA: Diagnosis not present

## 2022-06-13 DIAGNOSIS — L89023 Pressure ulcer of left elbow, stage 3: Secondary | ICD-10-CM | POA: Diagnosis not present

## 2022-06-13 DIAGNOSIS — I2510 Atherosclerotic heart disease of native coronary artery without angina pectoris: Secondary | ICD-10-CM | POA: Diagnosis not present

## 2022-06-13 DIAGNOSIS — I4819 Other persistent atrial fibrillation: Secondary | ICD-10-CM | POA: Diagnosis not present

## 2022-06-13 NOTE — Progress Notes (Signed)
Brian [name], Brian [name] Brian [name] ([id number]) [id number] Page 1 of 8 Visit Report for 06/13/2022 Arrival Information Details Patient Name: Date of Service: Brian [name] 06/13/2022 1:15 PM Medical Record Number: [id number] Patient Account Number: [id number] Date of Birth/Sex: Treating RN: 04/21/1933 (89 y.o. [name]) Primary Care Segundo Makela: Abelino Derrick Other Clinician: Referring Jermya Dowding: Treating Armen Waring/Extender: Sandi Raveling in Treatment: 58 Visit Information History Since Last Visit All ordered tests and consults were completed: No Patient Arrived: Wheel Chair Added or deleted any medications: No Arrival Time: 13:42 Any new allergies or adverse reactions: No Accompanied By: son Had a fall or experienced change in No Transfer Assistance: None activities of daily living that may affect Patient Identification Verified: Yes risk of falls: Secondary Verification Process Completed: Yes Signs or symptoms of abuse/neglect since last visito No Patient Requires Transmission-Based Precautions: No Hospitalized since last visit: No Patient Has Alerts: Yes Implantable device outside of the clinic excluding No Patient Alerts: Patient on Blood Thinner cellular tissue based products placed in the center since last visit: Pain Present Now: Yes Electronic Signature(s) Signed: 06/13/2022 4:13:28 PM By: Worthy Rancher Entered By: Worthy Rancher on 06/13/2022 13:43:33 -------------------------------------------------------------------------------- Encounter Discharge Information Details Patient Name: Date of Service: Brian [name] 06/13/2022 1:15 PM Medical Record Number: [id number] Patient Account Number: [id number] Date of Birth/Sex: Treating RN: 10-03-1932 (89 y.o. [name] Primary Care Audon Heymann: Abelino Derrick Other Clinician: Referring Rechelle Niebla: Treating Raynell Upton/Extender: Sandi Raveling in Treatment:  17 Encounter Discharge Information Items Post Procedure Vitals Discharge Condition: Stable Temperature (F): 98.4 Ambulatory Status: Wheelchair Pulse (bpm): 70 Discharge Destination: Home Respiratory Rate (breaths/min): 20 Transportation: Private Auto Blood Pressure (mmHg): 132/70 Accompanied By: son .................... Schedule Follow-up Appointment: Yes Clinical Summary of Care: Patient Declined Electronic Signature(s) Signed: 06/13/2022 5:05:10 PM By: Dellie Catholic RN Entered By: Dellie Catholic on 06/13/2022 17:03:52 Brian [name] ([id number]) [id number] Page 2 of 8 -------------------------------------------------------------------------------- Lower Extremity Assessment Details Patient Name: Date of Service: Brian [name] 06/13/2022 1:15 PM Medical Record Number: [id number] Patient Account Number: [id number] Date of Birth/Sex: Treating RN: April 10, 1933 (88 y.o. [name] Primary Care Jermarion Poffenberger: Abelino Derrick Other Clinician: Referring Margret Moat: Treating Chimaobi Casebolt/Extender: Sandi Raveling in Treatment: 17 Electronic Signature(s) Signed: 06/13/2022 5:05:10 PM By: Dellie Catholic RN Entered By: Dellie Catholic on 06/13/2022 13:57:21 -------------------------------------------------------------------------------- Multi Wound Chart Details Patient Name: Date of Service: Brian [name] 06/13/2022 1:15 PM Medical Record Number: [id number] Patient Account Number: [id number] Date of Birth/Sex: Treating RN: 1933/02/12 (88 y.o. [name]) Primary Care Tristian Bouska: Abelino Derrick Other Clinician: Referring Matty Deamer: Treating Jaquil Todt/Extender: Sandi Raveling in Treatment: 17 Vital Signs Height(in): 66 Pulse(bpm): 70 Weight(lbs): 185 Blood Pressure(mmHg): 132/70 Body Mass Index(BMI): 29.9 Temperature(F): 98.4 Respiratory Rate(breaths/min): 20 [1:Photos:] [N/A:N/A] Left Elbow Left  Gluteus N/A Wound Location: Pressure Injury Pressure Injury N/A Wounding Event: Pressure Ulcer Pressure Ulcer N/A Primary Etiology: Cataracts, Arrhythmia, Coronary Cataracts, Arrhythmia, Coronary N/A Comorbid History: Artery Disease, Hypotension, Artery Disease, Hypotension, Osteoarthritis Osteoarthritis 12/19/2021 04/21/2022 N/A Date Acquired: 17 3 N/A Weeks of Treatment: Open Open N/A Wound Status: No No N/A Wound Recurrence: Yes No N/A Clustered Wound: 2.5x1.8x0.1 0.6x0.8x0.1 N/A Measurements L x W x D (cm) 3.534 0.377 N/A A (cm) : rea 0.353 0.038 N/A Volume (cm) : 63.30% 61.90% N/A % Reduction in A rea: 63.30% 61.60% N/A % Reduction in Volume: Category/Stage III Category/Stage III N/A Classification: Medium Medium N/A Exudate  A mount: Serosanguineous Serosanguineous N/A Exudate Type: red, brown red, brown N/A Exudate Color: Distinct, outline attached Flat and Intact N/A Wound Margin: Large (67-100%) Large (67-100%) N/A Granulation A mount: Brian [name] ([id number]) [id number] Page 3 of 8 Red, Hyper-granulation Red N/A Granulation Quality: Small (1-33%) Small (1-33%) N/A Necrotic Amount: Fat Layer (Subcutaneous Tissue): Yes Fat Layer (Subcutaneous Tissue): Yes N/A Exposed Structures: Fascia: No Fascia: No Tendon: No Tendon: No Muscle: No Muscle: No Joint: No Joint: No Bone: No Bone: No Small (1-33%) Small (1-33%) N/A Epithelialization: Debridement - Excisional N/A N/A Debridement: Pre-procedure Verification/Time Out 14:20 N/A N/A Taken: Lidocaine 4% T opical Solution N/A N/A Pain Control: Subcutaneous, Slough N/A N/A Tissue Debrided: Skin/Subcutaneous Tissue N/A N/A Level: 4.5 N/A N/A Debridement A (sq cm): rea Curette N/A N/A Instrument: Minimum N/A N/A Bleeding: Silver Nitrate N/A N/A Hemostasis A chieved: 0 N/A N/A Procedural Pain: 0 N/A N/A Post Procedural Pain: Procedure was tolerated well N/A  N/A Debridement Treatment Response: 2.5x1.8x0.1 N/A N/A Post Debridement Measurements L x W x D (cm) 0.353 N/A N/A Post Debridement Volume: (cm) Category/Stage III N/A N/A Post Debridement Stage: No Abnormalities Noted Rash: Yes N/A Periwound Skin Texture: No Abnormalities Noted Dry/Scaly: Yes N/A Periwound Skin Moisture: Maceration: No No Abnormalities Noted No Abnormalities Noted N/A Periwound Skin Color: No Abnormality No Abnormality N/A Temperature: Yes Yes N/A Tenderness on Palpation: Debridement N/A N/A Procedures Performed: Treatment Notes Electronic Signature(s) Signed: 06/13/2022 2:30:08 PM By: Fredirick Maudlin MD FACS Entered By: Fredirick Maudlin on 06/13/2022 14:30:08 -------------------------------------------------------------------------------- Multi-Disciplinary Care Plan Details Patient Name: Date of Service: Brian [name] 06/13/2022 1:15 PM Medical Record Number: [id number] Patient Account Number: [id number] Date of Birth/Sex: Treating RN: 15-Nov-1932 (88 y.o. [name] Primary Care Won Kreuzer: Abelino Derrick Other Clinician: Referring Negin Hegg: Treating Tyray Proch/Extender: Sandi Raveling in Treatment: [place] reviewed with physician Active Inactive Abuse / Safety / Falls / Self Care Management Nursing Diagnoses: Impaired physical mobility Potential for falls Goals: Patient/caregiver will verbalize understanding of skin care regimen Date Initiated: 02/14/2022 Target Resolution Date: 08/20/2022 Goal Status: Active Patient/caregiver will verbalize/demonstrate measures taken to prevent injury and/or falls Date Initiated: 02/14/2022 Target Resolution Date: 08/20/2022 Goal Status: Active Interventions: Assess fall risk on admission and as needed EZEKIEL, MENZER ([id number]) [id number] Page 4 of 8 Assess self care needs on admission and as needed Notes: Wound/Skin  Impairment Nursing Diagnoses: Impaired tissue integrity Knowledge deficit related to ulceration/compromised skin integrity Goals: Patient/caregiver will verbalize understanding of skin care regimen Date Initiated: 02/14/2022 Target Resolution Date: 08/20/2022 Goal Status: Active Ulcer/skin breakdown will have a volume reduction of 30% by week 4 Date Initiated: 02/14/2022 Date Inactivated: 05/23/2022 Target Resolution Date: 05/05/2022 Goal Status: Unmet Unmet Reason: new PU left glut Interventions: Assess patient/caregiver ability to obtain necessary supplies Assess patient/caregiver ability to perform ulcer/skin care regimen upon admission and as needed Assess ulceration(s) every visit Treatment Activities: Skin care regimen initiated : 02/14/2022 Topical wound management initiated : 02/14/2022 Notes: Electronic Signature(s) Signed: 06/13/2022 5:05:10 PM By: Dellie Catholic RN Entered By: Dellie Catholic on 06/13/2022 17:02:03 -------------------------------------------------------------------------------- Pain Assessment Details Patient Name: Date of Service: Brian [name] 06/13/2022 1:15 PM Medical Record Number: [id number] Patient Account Number: [id number] Date of Birth/Sex: Treating RN: 1933-06-07 (88 y.o. [name]) Primary Care Jaquan Sadowsky: Abelino Derrick Other Clinician: Referring Kimisha Eunice: Treating Feliciana Narayan/Extender: Sandi Raveling in Treatment: 17 Active Problems Location of Pain Severity and Description of Pain Patient  Has Paino Yes Site Locations Rate the pain. Current Pain Level: 5 Worst Pain Level: 10 Least Pain Level: 0 Tolerable Pain Level: 7 Character of Pain Describe the Pain: EH, SESAY ([id number]) [id number] Page 5 of 8 Pain Management and Medication Current Pain Management: Electronic Signature(s) Signed: 06/13/2022 4:13:28 PM By: Worthy Rancher Entered By: Worthy Rancher on 06/13/2022  13:45:08 -------------------------------------------------------------------------------- Patient/Caregiver Education Details Patient Name: Date of Service: Brian [name] 10/24/2023andnbsp1:15 PM Medical Record Number: [id number] Patient Account Number: [id number] Date of Birth/Gender: Treating RN: July 11, 1933 (88 y.o. [name] Primary Care Physician: Abelino Derrick Other Clinician: Referring Physician: Treating Physician/Extender: Sandi Raveling in Treatment: 17 Education Assessment Education Provided To: Patient Education Topics Provided Wound/Skin Impairment: Methods: Explain/Verbal Responses: Return demonstration correctly Electronic Signature(s) Signed: 06/13/2022 5:05:10 PM By: Dellie Catholic RN Entered By: Dellie Catholic on 06/13/2022 17:02:36 -------------------------------------------------------------------------------- Wound Assessment Details Patient Name: Date of Service: Brian [name] 06/13/2022 1:15 PM Medical Record Number: [id number] Patient Account Number: [id number] Date of Birth/Sex: Treating RN: Mar 17, 1933 (88 y.o. [name] Primary Care Iban Utz: Abelino Derrick Other Clinician: Referring Eaden Hettinger: Treating Margerite Impastato/Extender: Sandi Raveling in Treatment: 17 Wound Status Wound Number: 1 Primary Pressure Ulcer Etiology: Wound Location: Left Elbow Wound Status: Open Wounding Event: Pressure Injury Comorbid Cataracts, Arrhythmia, Coronary Artery Disease, Date Acquired: 12/19/2021 History: Hypotension, Osteoarthritis Weeks Of Treatment: 17 Clustered Wound: Yes Photos VIRLAN, KEMPKER ([id number]) [id number] Page 6 of 8 Wound Measurements Length: (cm) 2.5 Width: (cm) 1.8 Depth: (cm) 0.1 Area: (cm) 3.534 Volume: (cm) 0.353 % Reduction in Area: 63.3% % Reduction in Volume: 63.3% Epithelialization: Small (1-33%) Tunneling: No Undermining:  No Wound Description Classification: Category/Stage III Wound Margin: Distinct, outline attached Exudate Amount: Medium Exudate Type: Serosanguineous Exudate Color: red, brown Foul Odor After Cleansing: No Slough/Fibrino Yes Wound Bed Granulation Amount: Large (67-100%) Exposed Structure Granulation Quality: Red, Hyper-granulation Fascia Exposed: No Necrotic Amount: Small (1-33%) Fat Layer (Subcutaneous Tissue) Exposed: Yes Necrotic Quality: Adherent Slough Tendon Exposed: No Muscle Exposed: No Joint Exposed: No Bone Exposed: No Periwound Skin Texture Texture Color No Abnormalities Noted: Yes No Abnormalities Noted: Yes Moisture Temperature / Pain No Abnormalities Noted: Yes Temperature: No Abnormality Tenderness on Palpation: Yes Treatment Notes Wound #1 (Elbow) Wound Laterality: Left Cleanser Soap and Water Discharge Instruction: May shower and wash wound with dial antibacterial soap and water prior to dressing change. Wound Cleanser Discharge Instruction: Cleanse the wound with wound cleanser prior to applying a clean dressing using gauze sponges, not tissue or cotton balls. Peri-Wound Care Topical Primary Dressing Hydrofera Blue Ready Foam, 2.5 x2.5 in Discharge Instruction: Apply to wound bed as instructed Secondary Dressing ALLEVYN Gentle Border, 5x5 (in/in) Discharge Instruction: Apply over primary dressing as directed. Secured With Compression Wrap Compression Stockings Add-Ons Soil scientist, 6 (in) [employer]) Harwood, Baldemar [name] ([id number]) [phone].pdf Page 7 of 8 Signed: 06/13/2022 5:05:10 PM By: Dellie Catholic RN Entered By: Dellie Catholic on 06/13/2022 14:06:41 -------------------------------------------------------------------------------- Wound Assessment Details Patient Name: Date of Service: Brian [name] 06/13/2022 1:15 PM Medical Record Number: [id number] Patient Account Number:  [id number] Date of Birth/Sex: Treating RN: Jun 01, 1933 (88 y.o. [name] Primary Care Lexis Potenza: Abelino Derrick Other Clinician: Referring Lamaj Metoyer: Treating Neiva Maenza/Extender: Sandi Raveling in Treatment: 17 Wound Status Wound Number: 2 Primary Pressure Ulcer Etiology: Wound Location: Left Gluteus Wound Status: Open Wounding Event: Pressure Injury Comorbid Cataracts, Arrhythmia, Coronary Artery  Disease, Date Acquired: 04/21/2022 History: Hypotension, Osteoarthritis Weeks Of Treatment: 3 Clustered Wound: No Photos Wound Measurements Length: (cm) 0.6 Width: (cm) 0.8 Depth: (cm) 0.1 Area: (cm) 0.377 Volume: (cm) 0.038 % Reduction in Area: 61.9% % Reduction in Volume: 61.6% Epithelialization: Small (1-33%) Tunneling: No Undermining: No Wound Description Classification: Category/Stage III Wound Margin: Flat and Intact Exudate Amount: Medium Exudate Type: Serosanguineous Exudate Color: red, brown Foul Odor After Cleansing: No Slough/Fibrino Yes Wound Bed Granulation Amount: Large (67-100%) Exposed Structure Granulation Quality: Red Fascia Exposed: No Necrotic Amount: Small (1-33%) Fat Layer (Subcutaneous Tissue) Exposed: Yes Necrotic Quality: Adherent Slough Tendon Exposed: No Muscle Exposed: No Joint Exposed: No Bone Exposed: No Periwound Skin Texture Texture Color No Abnormalities Noted: No No Abnormalities Noted: Yes Rash: Yes Temperature / Pain Temperature: No Abnormality Moisture No Abnormalities Noted: No Tenderness on Palpation: Yes Dry / Scaly: Yes Maceration: No ARLAND, USERY ([id number]) [id number] Page 8 of 8 Treatment Notes Wound #2 (Gluteus) Wound Laterality: Left Cleanser Peri-Wound Care Topical Primary Dressing Hydrofera Blue Ready Foam, 2.5 x2.5 in Discharge Instruction: Apply to wound bed as instructed Secondary Dressing ALLEVYN Gentle Border, 3x3 (in/in) Discharge  Instruction: Apply over primary dressing as directed. Secured With Compression Wrap Compression Stockings Environmental education officer) Signed: 06/13/2022 5:05:10 PM By: Dellie Catholic RN Entered By: Dellie Catholic on 06/13/2022 14:05:02 -------------------------------------------------------------------------------- Vitals Details Patient Name: Date of Service: Brian [name] 06/13/2022 1:15 PM Medical Record Number: [id number] Patient Account Number: [id number] Date of Birth/Sex: Treating RN: 11-Mar-1933 (88 y.o. [name]) Primary Care Taya Ashbaugh: Abelino Derrick Other Clinician: Referring Wyndham Santilli: Treating Fonnie Crookshanks/Extender: Sandi Raveling in Treatment: 17 Vital Signs Time Taken: 01:40 Temperature (F): 98.4 Height (in): 66 Pulse (bpm): 70 Weight (lbs): 185 Respiratory Rate (breaths/min): 20 Body Mass Index (BMI): 29.9 Blood Pressure (mmHg): 132/70 Reference Range: 80 - 120 mg / dl Electronic Signature(s) Signed: 06/13/2022 4:13:28 PM By: Worthy Rancher Entered By: Worthy Rancher on 06/13/2022 13:44:18

## 2022-06-13 NOTE — Progress Notes (Signed)
Brian [name], KOPPELMAN ([id number]) [id number] Page 1 of 9 Visit Report for 06/13/2022 Chief Complaint Document Details Patient Name: Date of Service: Brian [name] 06/13/2022 1:15 PM Medical Record Number: [id number] Patient Account Number: [id number] Date of Birth/Sex: Treating RN: 06/20/1933 (88 y.o. [name]) Primary Care Provider: Abelino Derrick Other Clinician: Referring Provider: Treating Provider/Extender: Sandi Raveling in Treatment: 17 Information Obtained from: Patient Chief Complaint 08/05/2019; patient is here for review of pressure ulcers on his buttock 02/14/22: patient is here for review of pressure ulcers on his left elbow Electronic Signature(s) Signed: 06/13/2022 2:30:13 PM By: Fredirick Maudlin MD FACS Entered By: Fredirick Maudlin on 06/13/2022 14:30:13 -------------------------------------------------------------------------------- Debridement Details Patient Name: Date of Service: Brian [name] 06/13/2022 1:15 PM Medical Record Number: [id number] Patient Account Number: [id number] Date of Birth/Sex: Treating RN: Mar 05, 1933 (89 y.o. Brian [name] Primary Care Provider: Abelino Derrick Other Clinician: Referring Provider: Treating Provider/Extender: Sandi Raveling in Treatment: 17 Debridement Performed for Assessment: Wound #1 Left Elbow Performed By: Physician Fredirick Maudlin, MD Debridement Type: Debridement Level of Consciousness (Pre-procedure): Awake and Alert Pre-procedure Verification/Time Out Yes - 14:20 Taken: Start Time: 14:20 Pain Control: Lidocaine 4% T opical Solution T Area Debrided (L x W): otal 2.5 (cm) x 1.8 (cm) = 4.5 (cm) Tissue and other material debrided: Non-Viable, Slough, Subcutaneous, Slough Level: Skin/Subcutaneous Tissue Debridement Description: Excisional Instrument: Curette Bleeding: Minimum Hemostasis Achieved: Silver Nitrate End Time:  14:21 Procedural Pain: 0 Post Procedural Pain: 0 Response to Treatment: Procedure was tolerated well Level of Consciousness (Post- Awake and Alert procedure): Post Debridement Measurements of Total Wound Length: (cm) 2.5 Stage: Category/Stage III Width: (cm) 1.8 Depth: (cm) 0.1 Volume: (cm) 0.353 Character of Wound/Ulcer Post Debridement: Improved Brian [name] ([id number]) [id number] Page 2 of 9 Post Procedure Diagnosis Same as Pre-procedure Notes Scribed for Dr. Celine Ahr by J.Scotton Electronic Signature(s) Signed: 06/13/2022 2:34:04 PM By: Fredirick Maudlin MD FACS Signed: 06/13/2022 5:05:10 PM By: Dellie Catholic RN Entered By: Dellie Catholic on 06/13/2022 14:25:26 -------------------------------------------------------------------------------- HPI Details Patient Name: Date of Service: Brian [name] 06/13/2022 1:15 PM Medical Record Number: [id number] Patient Account Number: [id number] Date of Birth/Sex: Treating RN: 1933/05/19 (88 y.o. [name]) Primary Care Provider: Abelino Derrick Other Clinician: Referring Provider: Treating Provider/Extender: Sandi Raveling in Treatment: 17 History of Present Illness HPI Description: ADMISSION 08/05/19 This is an 88 year old man who arrives in clinic accompanied by his son. 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 Apparently noted recently to have a stage I pressure area on the left buttock. He does not have a wound history. He does have been air fluidized mattress. They have a wheelchair cushion as well as a pillow over the top of this. He is not incontinent of urine or bowel. Past medical history includes hypertension, osteoarthritis of the knee, atrial fibrillation, history of mitral valve repair, right hemisphere CVA with left hemiparesis, history of a DVT READMISSION 02/14/2022 The patient is here today for evaluation of pressure  ulcers on his left elbow and possible right buttock ulcer. Apparently when he was seen by Dr. Dellia Nims in 2020, no ulcer was appreciated on the buttock and no further wound care involvement occurred. 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 He does have home health  aides and they are turning him every 2 hours side to side. He is on a memory foam mattress at this time. 03/14/2022: The patient was scheduled to see me on July 11, but apparently his son panicked about some bright red blood per rectum and rushed him to the emergency room. There is also a comment in the electronic medical record that the patient's son thought the bone was exposed on his elbow. After waiting a prolonged period of time at the emergency department, they left without being seen. Today, it appears that the dressing on the patient's elbow was never changed since our last visit. The 2 wounds have converged creating a larger wound. According to the intake nurse, there was a foul odor from the wound and dressing. There is extensive slough on the wound surface. The bleeding per rectum turns out to be hemorrhoids. The patient's son expresses that he does not know how to manage this. 04/07/2022: For unclear reasons, the patient has not returned to clinic until they were contacted today and they made an add-on visit. The patient's son was not originally present at the beginning of the visit but showed up about 25 minutes into the visit. The patient was accompanied by a home aide. She reported that when she cleaned the patient up today, there was just a Band-Aid on his wound. She said the surface was fairly mucky but she was able to wash this off. It is abundantly clear that this wound is not being properly cared for. Apparently he did have [facility] home health but they have not been out in some time and it is not  clear the reason for this. The surface of the wound is a bit cleaner so perhaps the Santyl has been applied, but unfortunately there is now a deep tunnel that extends up from the elbow to the patient's posterior upper arm for a number centimeters. There is no evidence of infection or purulent drainage. It is also clear that the patient continues to spend a substantial portion of his time leaning on the elbow. 05/23/2022: Once again, the patient has failed to return to clinic for about 6 weeks and the reason remains unclear. He is apparently receiving home health assistance. The wound on his elbow is covered with hypertrophic granulation tissue. He also has a blister just distal to the wound on his posterior forearm. He has a new stage III pressure ulcer on his left gluteus with slough accumulation. He apparently spends a substantial portion of his days sitting in a wheelchair. I do not believe he has a Financial trader. Due to his stroke, he lists to the left side, which is how his ulcer on his elbow developed. I suspect the gluteal ulcer is as a result of this, as well. 06/13/2022: The wound on his left gluteus is nearly healed. It is very superficial and quite clean. His son reports that he has just been applying Desitin to the area. The elbow wound is smaller but still has some hypertrophic granulation tissue and slough accumulation. No concern for infection at either site. Electronic Signature(s) Signed: 06/13/2022 2:31:22 PM By: Fredirick Maudlin MD FACS Entered By: Fredirick Maudlin on 06/13/2022 14:31:21 [name], Brian [name] ([id number]) [id number] Page 3 of 9 -------------------------------------------------------------------------------- Physical Exam Details Patient Name: Date of Service: Brian [name] 06/13/2022 1:15 PM Medical Record Number: [id number] Patient Account Number: [id number] Date of Birth/Sex: Treating RN: February 19, 1933 (88 y.o. [name]) Primary Care Provider:  Abelino Derrick Other Clinician: Referring Provider:  Treating Provider/Extender: Sandi Raveling in Treatment: 17 Constitutional . . . . No acute distress.Marland Kitchen Respiratory Normal work of breathing on room air.. Notes 06/13/2022: The wound on his left gluteus is nearly healed. It is very superficial and quite clean. His son reports that he has just been applying Desitin to the area. The elbow wound is smaller but still has some hypertrophic granulation tissue and slough accumulation. No concern for infection at either site. Electronic Signature(s) Signed: 06/13/2022 2:32:23 PM By: Fredirick Maudlin MD FACS Entered By: Fredirick Maudlin on 06/13/2022 14:32:23 -------------------------------------------------------------------------------- Physician Orders Details Patient Name: Date of Service: Brian [name] ID [name] 06/13/2022 1:15 PM Medical Record Number: [id number] Patient Account Number: [id number] Date of Birth/Sex: Treating RN: 11/24/1932 (89 y.o. Brian [name] Primary Care Provider: Abelino Derrick Other Clinician: Referring Provider: Treating Provider/Extender: Sandi Raveling in Treatment: [phone] Verbal / Phone Orders: No Diagnosis Coding ICD-10 Coding Code Description L89.023 Pressure ulcer of left elbow, stage 3 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 I48.19 Other persistent atrial fibrillation I25.10 Atherosclerotic heart disease of native coronary artery without angina pectoris L89.323 Pressure ulcer of left buttock, stage 3 Follow-up Appointments Return appointment in 3 weeks. - Dr. Celine Ahr RM 3 Tuesday November 14th at 1:15pm Bathing/ Shower/ Hygiene May shower and wash wound with soap and water. - with dressing changes Off-Loading Turn and reposition every 2 hours - stay off of left elbow wear prevalon boot on elbow as directed Other: - limit time in wheelchair, reposition in  wheelchair every 1-2 hours [place] #1 Left Elbow New wound care orders this week; continue Home Health for wound care. May utilize formulary equivalent dressing for wound treatment orders unless otherwise specified. - change dressings to hydrofera blue ready Other Home Health Orders/Instructions: Bronislaus Verdell, [place] [name] ([id number]) [id number] Page 4 of 9 Wound Treatment Wound #1 - Elbow Wound Laterality: Left Cleanser: Soap and Water Every Other Day/30 Days Discharge Instructions: May shower and wash wound with dial antibacterial soap and water prior to dressing change. Cleanser: Wound Cleanser Every Other Day/30 Days Discharge Instructions: Cleanse the wound with wound cleanser prior to applying a clean dressing using gauze sponges, not tissue or cotton balls. Prim Dressing: Hydrofera Blue Ready Foam, 2.5 x2.5 in Every Other Day/30 Days ary Discharge Instructions: Apply to wound bed as instructed Secondary Dressing: ALLEVYN Gentle Border, 5x5 (in/in) (Dispense As Written) Every Other Day/30 Days Discharge Instructions: Apply over primary dressing as directed. Add-Ons: Cotton Tip Applicator, 6 (in) (Generic) Every Other Day/30 Days Wound #2 - Gluteus Wound Laterality: Left Prim Dressing: Hydrofera Blue Ready Foam, 2.5 x2.5 in Every Other Day/30 Days ary Discharge Instructions: Apply to wound bed as instructed Secondary Dressing: ALLEVYN Gentle Border, 3x3 (in/in) Every Other Day/30 Days Discharge Instructions: Apply over primary dressing as directed. Electronic Signature(s) Signed: 06/13/2022 2:34:04 PM By: Fredirick Maudlin MD FACS Entered By: Fredirick Maudlin on 06/13/2022 14:32:43 -------------------------------------------------------------------------------- Problem List Details Patient Name: Date of Service: Brian [name] 06/13/2022 1:15 PM Medical Record Number: [id number] Patient Account Number: [id number] Date of Birth/Sex:  Treating RN: 05/04/1933 (88 y.o. [name]) Primary Care Provider: Abelino Derrick Other Clinician: Referring Provider: Treating Provider/Extender: Sandi Raveling in Treatment: 17 Active Problems ICD-10 Encounter Code Description Active Date MDM Diagnosis L89.023 Pressure ulcer of left elbow, stage 3 02/14/2022 No Yes 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 02/14/2022 No Yes I48.19  Other persistent atrial fibrillation 02/14/2022 No Yes I25.10 Atherosclerotic heart disease of native coronary artery without angina pectoris 02/14/2022 No Yes L89.323 Pressure ulcer of left buttock, stage 3 05/23/2022 No Yes Inactive Problems Brian [name], Brian [name] ([id number]) [id number] Page 5 of 9 Resolved Problems Electronic Signature(s) Signed: 06/13/2022 2:30:00 PM By: Fredirick Maudlin MD FACS Entered By: Fredirick Maudlin on 06/13/2022 14:30:00 -------------------------------------------------------------------------------- Progress Note Details Patient Name: Date of Service: Brian [name] ID [name] 06/13/2022 1:15 PM Medical Record Number: [id number] Patient Account Number: [id number] Date of Birth/Sex: Treating RN: Mar 07, 1933 (89 y.o. [name]) Primary Care Provider: Abelino Derrick Other Clinician: Referring Provider: Treating Provider/Extender: Sandi Raveling in Treatment: 17 Subjective Chief Complaint Information obtained from Patient 08/05/2019; patient is here for review of pressure ulcers on his buttock 02/14/22: patient is here for review of pressure ulcers on his left elbow History of Present Illness (HPI) ADMISSION 08/05/19 This is an 88 year old man who arrives in clinic accompanied by his son. 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 Apparently noted recently to have a stage I pressure area on the left buttock. He does not have a wound history. He does have  been air fluidized mattress. They have a wheelchair cushion as well as a pillow over the top of this. He is not incontinent of urine or bowel. Past medical history includes hypertension, osteoarthritis of the knee, atrial fibrillation, history of mitral valve repair, right hemisphere CVA with left hemiparesis, history of a DVT READMISSION 02/14/2022 The patient is here today for evaluation of pressure ulcers on his left elbow and possible right buttock ulcer. Apparently when he was seen by Dr. Dellia Nims in 2020, no ulcer was appreciated on the buttock and no further wound care involvement occurred. 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 He does have home health aides and they are turning him every 2 hours side to side. He is on a memory foam mattress at this time. 03/14/2022: The patient was scheduled to see me on July 11, but apparently his son panicked about some bright red blood per rectum and rushed him to the emergency room. There is also a comment in the electronic medical record that the patient's son thought the bone was exposed on his elbow. After waiting a prolonged period of time at the emergency department, they left without being seen. Today, it appears that the dressing on the patient's elbow was never changed since our last visit. The 2 wounds have converged creating a larger wound. According to the intake nurse, there was a foul odor from the wound and dressing. There is extensive slough on the wound surface. The bleeding per rectum turns out to be hemorrhoids. The patient's son expresses that he does not know how to manage this. 04/07/2022: For unclear reasons, the patient has not returned to clinic until they were contacted today and they made an add-on visit. The patient's son was not originally present at the beginning of the visit but showed up about 25  minutes into the visit. The patient was accompanied by a home aide. She reported that when she cleaned the patient up today, there was just a Band-Aid on his wound. She said the surface was fairly mucky but she was able to wash this off. It is abundantly clear that this wound is not being properly  cared for. Apparently he did have [facility] home health but they have not been out in some time and it is not clear the reason for this. The surface of the wound is a bit cleaner so perhaps the Santyl has been applied, but unfortunately there is now a deep tunnel that extends up from the elbow to the patient's posterior upper arm for a number centimeters. There is no evidence of infection or purulent drainage. It is also clear that the patient continues to spend a substantial portion of his time leaning on the elbow. 05/23/2022: Once again, the patient has failed to return to clinic for about 6 weeks and the reason remains unclear. He is apparently receiving home health assistance. The wound on his elbow is covered with hypertrophic granulation tissue. He also has a blister just distal to the wound on his posterior forearm. He has a new stage III pressure ulcer on his left gluteus with slough accumulation. He apparently spends a substantial portion of his days sitting in a wheelchair. I do not believe he has a Financial trader. Due to his stroke, he lists to the left side, which is how his ulcer on his elbow developed. I suspect the gluteal ulcer is as a result of this, as well. 06/13/2022: The wound on his left gluteus is nearly healed. It is very superficial and quite clean. His son reports that he has just been applying Desitin to the area. The elbow wound is smaller but still has some hypertrophic granulation tissue and slough accumulation. No concern for infection at either site. Patient History Information obtained from Patient. Family History Diabetes - Maternal Grandparents, Heart Disease - Mother,  Hypertension - Mother, No family history of Cancer, Hereditary Spherocytosis, Kidney Disease, Lung Disease, Seizures, Stroke, Thyroid Problems, Tuberculosis. Social History Never smoker, Marital Status - Married, Alcohol Use - Never, Drug Use - No History, Caffeine Use - Never. Medical History Eyes Brian [name], Brian [name] ([id number]) [id number] Page 6 of 9 Patient has history of Cataracts - removed Cardiovascular Patient has history of Arrhythmia - Atrial Flutter/Atrial Fib, Coronary Artery Disease, Hypotension Denies history of Hypertension Musculoskeletal Patient has history of Osteoarthritis Neurologic Denies history of Paraplegia Hospitalization/Surgery History - open heart surgery. - IR generic histoircal. - appendectomy. - hernia repair. - joint replacement. - mitral valve repair. - mvp repair. - total hip arthroplasty. Medical A Surgical History Notes nd Constitutional Symptoms (General Health) obseity Ear/Nose/Mouth/Throat wears hearing aids Cardiovascular Mitral Valve Repair, Genitourinary BPH Neurologic CVA 2018, left side hemiparesis Objective Constitutional No acute distress.. Vitals Time Taken: 1:40 AM, Height: 66 in, Weight: 185 lbs, BMI: 29.9, Temperature: 98.4 F, Pulse: 70 bpm, Respiratory Rate: 20 breaths/min, Blood Pressure: 132/70 mmHg. Respiratory Normal work of breathing on room air.. General Notes: 06/13/2022: The wound on his left gluteus is nearly healed. It is very superficial and quite clean. His son reports that he has just been applying Desitin to the area. The elbow wound is smaller but still has some hypertrophic granulation tissue and slough accumulation. No concern for infection at either site. Integumentary (Hair, Skin) Wound #1 status is Open. Original cause of wound was Pressure Injury. The date acquired was: 12/19/2021. The wound has been in treatment 17 weeks. The wound is located on the Left Elbow. The wound measures  2.5cm length x 1.8cm width x 0.1cm depth; 3.534cm^2 area and 0.353cm^3 volume. There is Fat Layer (Subcutaneous Tissue) exposed. There is no tunneling or undermining noted. There is a medium amount of serosanguineous drainage noted.  The wound margin is distinct with the outline attached to the wound base. There is large (67-100%) red, hyper - granulation within the wound bed. There is a small (1-33%) amount of necrotic tissue within the wound bed including Adherent Slough. The periwound skin appearance had no abnormalities noted for texture. The periwound skin appearance had no abnormalities noted for moisture. The periwound skin appearance had no abnormalities noted for color. Periwound temperature was noted as No Abnormality. The periwound has tenderness on palpation. Wound #2 status is Open. Original cause of wound was Pressure Injury. The date acquired was: 04/21/2022. The wound has been in treatment 3 weeks. The wound is located on the Left Gluteus. The wound measures 0.6cm length x 0.8cm width x 0.1cm depth; 0.377cm^2 area and 0.038cm^3 volume. There is Fat Layer (Subcutaneous Tissue) exposed. There is no tunneling or undermining noted. There is a medium amount of serosanguineous drainage noted. The wound margin is flat and intact. There is large (67-100%) red granulation within the wound bed. There is a small (1-33%) amount of necrotic tissue within the wound bed including Adherent Slough. The periwound skin appearance had no abnormalities noted for color. The periwound skin appearance exhibited: Rash, Dry/Scaly. The periwound skin appearance did not exhibit: Maceration. Periwound temperature was noted as No Abnormality. The periwound has tenderness on palpation. Assessment Active Problems ICD-10 Pressure ulcer of left elbow, stage 3 Cerebral infarction due to thrombosis of unspecified precerebral artery Other persistent atrial fibrillation Atherosclerotic heart disease of native coronary  artery without angina pectoris Pressure ulcer of left buttock, stage 3 Procedures Brian [name] ([id number]) [id number] Page 7 of 9 Wound #1 Pre-procedure diagnosis of Wound #1 is a Pressure Ulcer located on the Left Elbow . There was a Excisional Skin/Subcutaneous Tissue Debridement with a total area of 4.5 sq cm performed by Fredirick Maudlin, MD. With the following instrument(s): Curette to remove Non-Viable tissue/material. Material removed includes Subcutaneous Tissue and Slough and after achieving pain control using Lidocaine 4% T opical Solution. No specimens were taken. A time out was conducted at 14:20, prior to the start of the procedure. A Minimum amount of bleeding was controlled with Silver Nitrate. The procedure was tolerated well with a pain level of 0 throughout and a pain level of 0 following the procedure. Post Debridement Measurements: 2.5cm length x 1.8cm width x 0.1cm depth; 0.353cm^3 volume. Post debridement Stage noted as Category/Stage III. Character of Wound/Ulcer Post Debridement is improved. Post procedure Diagnosis Wound #1: Same as Pre-Procedure General Notes: Scribed for Dr. Celine Ahr by J.Scotton. Plan Follow-up Appointments: Return appointment in 3 weeks. - Dr. Celine Ahr RM 3 Tuesday November 14th at 1:15pm Bathing/ Shower/ Hygiene: May shower and wash wound with soap and water. - with dressing changes Off-Loading: Turn and reposition every 2 hours - stay off of left elbow wear prevalon boot on elbow as directed Other: - limit time in wheelchair, reposition in wheelchair every 1-2 hours Home Health: Wound #1 Left Elbow: New wound care orders this week; continue Home Health for wound care. May utilize formulary equivalent dressing for wound treatment orders unless otherwise specified. - change dressings to hydrofera blue ready Other Home Health Orders/Instructions: - XNATFTD WOUND #1: - Elbow Wound Laterality: Left Cleanser: Soap and  Water Every Other Day/30 Days Discharge Instructions: May shower and wash wound with dial antibacterial soap and water prior to dressing change. Cleanser: Wound Cleanser Every Other Day/30 Days Discharge Instructions: Cleanse the wound with wound cleanser prior to applying a clean dressing using gauze  sponges, not tissue or cotton balls. Prim Dressing: Hydrofera Blue Ready Foam, 2.5 x2.5 in Every Other Day/30 Days ary Discharge Instructions: Apply to wound bed as instructed Secondary Dressing: ALLEVYN Gentle Border, 5x5 (in/in) (Dispense As Written) Every Other Day/30 Days Discharge Instructions: Apply over primary dressing as directed. Add-Ons: Cotton Tip Applicator, 6 (in) (Generic) Every Other Day/30 Days WOUND #2: - Gluteus Wound Laterality: Left Prim Dressing: Hydrofera Blue Ready Foam, 2.5 x2.5 in Every Other Day/30 Days ary Discharge Instructions: Apply to wound bed as instructed Secondary Dressing: ALLEVYN Gentle Border, 3x3 (in/in) Every Other Day/30 Days Discharge Instructions: Apply over primary dressing as directed. 06/13/2022: The wound on his left gluteus is nearly healed. It is very superficial and quite clean. His son reports that he has just been applying Desitin to the area. The elbow wound is smaller but still has some hypertrophic granulation tissue and slough accumulation. No concern for infection at either site. I used a curette to debride slough off of the elbow wound. I then chemically cauterized the hypertrophic granulation tissue with silver nitrate. We will continue to apply Hydrofera Blue to the site. Continue Hydrofera Blue to the gluteal ulcer. Continue to offload is much as possible. Follow-up in 2 to 3 weeks. Electronic Signature(s) Signed: 06/13/2022 2:33:25 PM By: Fredirick Maudlin MD FACS Entered By: Fredirick Maudlin on 06/13/2022 14:33:25 -------------------------------------------------------------------------------- HxROS Details Patient Name: Date of  Service: Brian [name] ID [name] 06/13/2022 1:15 PM Medical Record Number: [id number] Patient Account Number: [id number] Date of Birth/Sex: Treating RN: 05-17-1933 (88 y.o. [name]) Primary Care Provider: Abelino Derrick Other Clinician: Referring Provider: Treating Provider/Extender: Sandi Raveling in Treatment: 17 Information Obtained From Patient Constitutional Symptoms (General Health) Medical History: Past Medical History Notes: Brian [name], Brian [name] ([id number]) [id number] Page 8 of 9 obseity Eyes Medical History: Positive for: Cataracts - removed Ear/Nose/Mouth/Throat Medical History: Past Medical History Notes: wears hearing aids Cardiovascular Medical History: Positive for: Arrhythmia - Atrial Flutter/Atrial Fib; Coronary Artery Disease; Hypotension Negative for: Hypertension Past Medical History Notes: Mitral Valve Repair, Genitourinary Medical History: Past Medical History Notes: BPH Musculoskeletal Medical History: Positive for: Osteoarthritis Neurologic Medical History: Negative for: Paraplegia Past Medical History Notes: CVA 2018, left side hemiparesis HBO Extended History Items Eyes: Cataracts Immunizations Pneumococcal Vaccine: Received Pneumococcal Vaccination: Yes Received Pneumococcal Vaccination On or After 60th Birthday: Yes Implantable Devices None Hospitalization / Surgery History Type of Hospitalization/Surgery open heart surgery IR generic histoircal appendectomy hernia repair joint replacement mitral valve repair mvp repair total hip arthroplasty Family and Social History Cancer: No; Diabetes: Yes - Maternal Grandparents; Heart Disease: Yes - Mother; Hereditary Spherocytosis: No; Hypertension: Yes - Mother; Kidney Disease: No; Lung Disease: No; Seizures: No; Stroke: No; Thyroid Problems: No; Tuberculosis: No; Never smoker; Marital Status - Married; Alcohol Use: Never; Drug Use: No History;  Caffeine Use: Never; Financial Concerns: No; Food, Clothing or Shelter Needs: No; Support System Lacking: No; Transportation Concerns: No Electronic Signature(s) Signed: 06/13/2022 2:34:04 PM By: Fredirick Maudlin MD FACS Entered By: Fredirick Maudlin on 06/13/2022 14:31:27 Brian [name], Brian [name] ([id number]) [id number] Page 9 of 9 -------------------------------------------------------------------------------- SuperBill Details Patient Name: Date of Service: Brian [name] 06/13/2022 Medical Record Number: [id number] Patient Account Number: [id number] Date of Birth/Sex: Treating RN: Sep 30, 1932 (88 y.o. [name]) Primary Care Provider: Abelino Derrick Other Clinician: Referring Provider: Treating Provider/Extender: Sandi Raveling in Treatment: 17 Diagnosis Coding ICD-10 Codes Code Description [phone] Pressure ulcer of left elbow, stage 3 I63.00 Cerebral infarction  due to thrombosis of unspecified precerebral artery I48.19 Other persistent atrial fibrillation I25.10 Atherosclerotic heart disease of native coronary artery without angina pectoris L89.323 Pressure ulcer of left buttock, stage 3 Facility Procedures : CPT4 Code: [id number] 1 Description: 4158 - DEB SUBQ TISSUE 20 SQ CM/< ICD-10 Diagnosis Description L89.023 Pressure ulcer of left elbow, stage 3 Modifier: Quantity: 1 Physician Procedures : CPT4 Code Description Modifier [id number] - WC PHYS LEVEL 3 - EST PT 25 ICD-10 Diagnosis Description L89.023 Pressure ulcer of left elbow, stage 3 L89.323 Pressure ulcer of left buttock, stage 3 I63.00 Cerebral infarction due to thrombosis of  unspecified precerebral artery I25.10 Atherosclerotic heart disease of native coronary artery without angina pectoris Quantity: 1 : [id number] 11042 - WC PHYS SUBQ TISS 20 SQ CM ICD-10 Diagnosis Description L89.023 Pressure ulcer of left elbow, stage 3 Quantity: 1 Electronic Signature(s) Signed: 06/13/2022  2:33:42 PM By: Fredirick Maudlin MD FACS Entered By: Fredirick Maudlin on 06/13/2022 14:33:42

## 2022-06-15 DIAGNOSIS — I69354 Hemiplegia and hemiparesis following cerebral infarction affecting left non-dominant side: Secondary | ICD-10-CM | POA: Diagnosis not present

## 2022-06-15 DIAGNOSIS — L89023 Pressure ulcer of left elbow, stage 3: Secondary | ICD-10-CM | POA: Diagnosis not present

## 2022-06-15 DIAGNOSIS — I10 Essential (primary) hypertension: Secondary | ICD-10-CM | POA: Diagnosis not present

## 2022-06-15 DIAGNOSIS — I4819 Other persistent atrial fibrillation: Secondary | ICD-10-CM | POA: Diagnosis not present

## 2022-06-15 DIAGNOSIS — Z7901 Long term (current) use of anticoagulants: Secondary | ICD-10-CM | POA: Diagnosis not present

## 2022-06-15 DIAGNOSIS — I2510 Atherosclerotic heart disease of native coronary artery without angina pectoris: Secondary | ICD-10-CM | POA: Diagnosis not present

## 2022-06-16 ENCOUNTER — Encounter: Payer: Self-pay | Admitting: Student in an Organized Health Care Education/Training Program

## 2022-06-16 NOTE — Telephone Encounter (Signed)
Patient has 5 refills remaining

## 2022-06-20 DIAGNOSIS — Z7401 Bed confinement status: Secondary | ICD-10-CM | POA: Diagnosis not present

## 2022-06-20 DIAGNOSIS — I10 Essential (primary) hypertension: Secondary | ICD-10-CM | POA: Diagnosis not present

## 2022-06-20 DIAGNOSIS — G894 Chronic pain syndrome: Secondary | ICD-10-CM | POA: Diagnosis not present

## 2022-06-20 DIAGNOSIS — Z7901 Long term (current) use of anticoagulants: Secondary | ICD-10-CM | POA: Diagnosis not present

## 2022-06-21 DIAGNOSIS — I69354 Hemiplegia and hemiparesis following cerebral infarction affecting left non-dominant side: Secondary | ICD-10-CM | POA: Diagnosis not present

## 2022-06-21 DIAGNOSIS — I2510 Atherosclerotic heart disease of native coronary artery without angina pectoris: Secondary | ICD-10-CM | POA: Diagnosis not present

## 2022-06-21 DIAGNOSIS — Z7901 Long term (current) use of anticoagulants: Secondary | ICD-10-CM | POA: Diagnosis not present

## 2022-06-21 DIAGNOSIS — L89023 Pressure ulcer of left elbow, stage 3: Secondary | ICD-10-CM | POA: Diagnosis not present

## 2022-06-21 DIAGNOSIS — I10 Essential (primary) hypertension: Secondary | ICD-10-CM | POA: Diagnosis not present

## 2022-06-21 DIAGNOSIS — I4819 Other persistent atrial fibrillation: Secondary | ICD-10-CM | POA: Diagnosis not present

## 2022-06-22 ENCOUNTER — Other Ambulatory Visit: Payer: Self-pay | Admitting: Family Medicine

## 2022-06-22 ENCOUNTER — Ambulatory Visit (INDEPENDENT_AMBULATORY_CARE_PROVIDER_SITE_OTHER): Payer: Medicare Other

## 2022-06-22 DIAGNOSIS — I10 Essential (primary) hypertension: Secondary | ICD-10-CM

## 2022-06-22 DIAGNOSIS — I4819 Other persistent atrial fibrillation: Secondary | ICD-10-CM

## 2022-06-22 DIAGNOSIS — J301 Allergic rhinitis due to pollen: Secondary | ICD-10-CM

## 2022-06-22 NOTE — Progress Notes (Signed)
Chronic Care Management Pharmacy Note  07/20/2022 Name:  SIRIUS WOODFORD MRN:  [id number] DOB:  03/24/1933  Summary: Patient presents for CCM follow-up.  Recommendations/Changes made from today's visit: Continue current medications  Plan: CPP follow-up in 9 months  Subjective: FRANCESCO PROVENCAL is an 88 y.o. year old male who is a primary patient of Ethelene Hal Mortimer Fries, MD.  The CCM team was consulted for assistance with disease management and care coordination needs.    Engaged with patient by telephone for follow up visit in response to provider referral for pharmacy case management and/or care coordination services.   Consent to Services:  The patient was given information about Chronic Care Management services, agreed to services, and gave verbal consent prior to initiation of services.  Please see initial visit note for detailed documentation.   Patient Care Team: Libby Maw, MD as PCP - General (Family Medicine) Skeet Latch, MD as PCP - Cardiology (Cardiology) Germaine Pomfret, [facility] as Pharmacist (Pharmacist) Irine Seal, MD as Attending Physician (Urology)  Recent office visits: 01/31/22: Patient presented to Dr. Ethelene Hal for follow-up.  Recent consult visits: 06/06/22: Patient presented to Dr. Holley Raring (Pain Management)   Hospital visits: 03/14/22: Patient hospitalized for Syncope.    Objective:  Lab Results  Component Value Date   CREATININE 0.98 03/14/2022   BUN 18 03/14/2022   GFR 84.36 11/01/2021   GFRNONAA >60 03/14/2022   GFRAA >60 11/03/2018   NA 137 03/14/2022   K 3.7 03/14/2022   CALCIUM 8.8 (L) 03/14/2022   CO2 26 03/14/2022   GLUCOSE 115 (H) 03/14/2022    Lab Results  Component Value Date/Time   HGBA1C 5.1 12/07/2020 03:11 PM   HGBA1C 4.9 04/08/2019 02:25 PM   GFR 84.36 11/01/2021 03:28 PM   GFR 89.37 05/03/2021 02:02 PM    Last diabetic Eye exam: No results found for: "HMDIABEYEEXA"  Last diabetic Foot exam: No results  found for: "HMDIABFOOTEX"   Lab Results  Component Value Date   CHOL 101 11/01/2021   HDL 45.20 11/01/2021   LDLCALC 36 11/01/2021   LDLDIRECT 68 02/06/2020   TRIG 99.0 11/01/2021   CHOLHDL 2 11/01/2021       Latest Ref Rng & Units 03/14/2022    7:04 PM 11/01/2021    3:28 PM 05/03/2021    2:02 PM  Hepatic Function  Total Protein 6.5 - 8.1 g/dL 6.3  6.0  6.0   Albumin 3.5 - 5.0 g/dL 3.2  3.8  3.8   AST 15 - 41 U/L _0 ALT 0 - 44 U/L _1 Alk Phosphatase 38 - 126 U/L 68  62  63   Total Bilirubin 0.3 - 1.2 mg/dL 0.8  0.3  0.5     Lab Results  Component Value Date/Time   TSH 1.55 12/07/2020 03:11 PM   TSH 2.99 09/26/2016 09:50 AM   FREET4 1.19 05/15/2011 09:25 AM       Latest Ref Rng & Units 03/15/2022    3:48 AM 03/14/2022    7:04 PM 02/28/2022    5:04 PM  CBC  WBC 4.0 - 10.5 K/uL 8.6  11.1  6.7   Hemoglobin 13.0 - 17.0 g/dL 10.1  10.5  11.0   Hematocrit 39.0 - 52.0 % 32.5  33.9  35.5   Platelets 150 - 400 K/uL 199  237  179     No results found for: "VD25OH"  Clinical ASCVD: Yes  The ASCVD Risk score (Arnett DK, et al., 2019) failed to calculate for the following reasons:   The 2019 ASCVD risk score is only valid for ages 17 to 51   The patient has a prior MI or stroke diagnosis       06/06/2022    1:02 PM 04/11/2022   12:56 PM 01/31/2022    2:04 PM  Depression screen PHQ 2/9  Decreased Interest 0 0 0  Down, Depressed, Hopeless 0 0 0  PHQ - 2 Score 0 0 0    Social History   Tobacco Use  Smoking Status Former  Smokeless Tobacco Never  Tobacco Comments   quit 50 yr ago   BP Readings from Last 3 Encounters:  06/06/22 (!) 123/46  05/18/22 118/76  04/11/22 (!) 122/42   Pulse Readings from Last 3 Encounters:  06/06/22 61  05/18/22 71  04/11/22 70   Wt Readings from Last 3 Encounters:  06/06/22 185 lb (83.9 kg)  04/11/22 150 lb (68 kg)  03/14/22 185 lb (83.9 kg)   BMI Readings from Last 3 Encounters:  06/06/22 29.86 kg/m  04/11/22  24.21 kg/m  03/14/22 28.13 kg/m    Assessment/Interventions: Review of patient past medical history, allergies, medications, health status, including review of consultants reports, laboratory and other test data, was performed as part of comprehensive evaluation and provision of chronic care management services.   SDOH:  (Social Determinants of Health) assessments and interventions performed: Yes SDOH Interventions    Flowsheet Row Chronic Care Management from 09/15/2021 in [place] Management from 09/07/2020 in [place] Management from 03/09/2020 in [place]  SDOH Interventions     Transportation Interventions -- Intervention Not Indicated Intervention Not Indicated  Financial Strain Interventions Intervention Not Indicated Intervention Not Indicated Intervention Not Indicated      SDOH Screenings   Transportation Needs: No Transportation Needs (09/07/2020)  Depression (PHQ2-9): Low Risk  (06/06/2022)  Financial Resource Strain: Low Risk  (09/16/2021)  Tobacco Use: Medium Risk (06/06/2022)    CCM Care Plan  Allergies  Allergen Reactions   Novocain [Procaine Hcl]     Irregular heart beat      Other     Patient had problem with epidural with his right hip surgery. Went to up extremity instead of lower extremity    Penicillins Itching    Has patient had a PCN reaction causing immediate rash, facial/tongue/throat swelling, SOB or lightheadedness with hypotension: Unk Has patient had a PCN reaction causing severe rash involving mucus membranes or skin necrosis: Unk Has patient had a PCN reaction that required hospitalization: Unk Has patient had a PCN reaction occurring within the last 10 years: No If all of the above answers are "NO", then may proceed with Cephalosporin use.  No reaction noted    Medications Reviewed Today     Reviewed by Gillis Santa, MD (Physician) on  06/06/22 at 1334  Med List Status: <None>   Medication Order Taking? Sig Documenting Provider Last Dose Status Informant  ALPRAZolam (XANAX) 0.25 MG tablet [id number] Yes Take 0.25 mg by mouth 2 (two) times daily as needed for anxiety. [provider] Taking Active Child  apixaban (ELIQUIS) 2.5 MG TABS tablet [id number] Yes TAKE 1 TABLET(2.5 MG) BY MOUTH TWICE DAILY  Patient taking differently: Take 2.5 mg by mouth 2 (two) times daily.   Libby Maw, MD Taking Active Child  cetirizine (ZYRTEC) 10 MG tablet [id number] Yes TAKE 1  TABLET(10 MG) BY MOUTH AT BEDTIME  Patient taking differently: Take 10 mg by mouth at bedtime as needed for allergies.   Libby Maw, MD Taking Active Child  finasteride (PROSCAR) 5 MG tablet [id number] Yes TAKE 1 TABLET(5 MG) BY MOUTH DAILY  Patient taking differently: Take 5 mg by mouth daily.   Libby Maw, MD Taking Active Child  fluticasone [facility]) 50 MCG/ACT nasal spray [id number] Yes Place 1 spray into both nostrils daily as needed for allergies. [provider] Taking Active Child  hydrocortisone ointment 0.5 % [id number] Yes Apply 1 Application topically daily as needed for itching. [provider] Taking Active Child  melatonin 5 MG TABS [id number] Yes Take 5 mg by mouth at bedtime as needed (sleep). [provider] Taking Active Child  metoprolol succinate (TOPROL-XL) 25 MG 24 hr tablet [id number] Yes TAKE 1 TABLET(25 MG) BY MOUTH DAILY  Patient taking differently: Take 25 mg by mouth daily.   Libby Maw, MD Taking Active Child  Multiple Vitamin (MULTIVITAMIN) tablet [id number] Yes Take 1 tablet by mouth 2 (two) times daily. [provider] Taking Active Child  MUPIROCIN EX [id number] Yes Apply 1 Application topically daily as needed (wound care). [provider] Taking Active Child  oxyCODONE-acetaminophen (PERCOCET) 7.5-325 MG tablet [id number]  Take 1 tablet by mouth  every 12 (twelve) hours as needed for severe pain. Gillis Santa, MD  Active   oxyCODONE-acetaminophen (PERCOCET) 7.5-325 MG tablet [id number]  Take 1 tablet by mouth every 12 (twelve) hours as needed for severe pain. Gillis Santa, MD  Active   oxyCODONE-acetaminophen (PERCOCET) 7.5-325 MG tablet [id number]  Take 1 tablet by mouth every 12 (twelve) hours as needed for severe pain. Gillis Santa, MD  Active   pantoprazole (PROTONIX) 40 MG tablet [id number] Yes TAKE 1 TABLET(40 MG) BY MOUTH DAILY Libby Maw, MD Taking Active   polyethylene glycol powder [facility]) 17 GM/SCOOP powder [id number] Yes Mix one scoop with water and take daily until stools loosen. May repeat as needed.  Patient taking differently: Take 17 g by mouth daily as needed for mild constipation.   Libby Maw, MD Taking Active Child  pravastatin (PRAVACHOL) 40 MG tablet [id number] Yes TAKE 1 TABLET(40 MG) BY MOUTH DAILY  Patient taking differently: Take 40 mg by mouth daily.   Libby Maw, MD Taking Active Child  pregabalin (LYRICA) 25 MG capsule [id number]  Take 1 capsule (25 mg total) by mouth at bedtime. Gillis Santa, MD  Active   QUEtiapine (SEROQUEL) 50 MG tablet [id number] Yes TAKE 1 TABLET(50 MG) BY MOUTH AT BEDTIME Libby Maw, MD Taking Active   sertraline (ZOLOFT) 50 MG tablet [id number] Yes TAKE 1 TABLET(50 MG) BY MOUTH EVERY MORNING  Patient taking differently: Take 50 mg by mouth daily.   Libby Maw, MD Taking Active Child  tamsulosin [facility]) 0.4 MG CAPS capsule [id number] Yes Take 1 capsule (0.4 mg total) by mouth daily after supper. Lavina Hamman, MD Taking Active   Med List Note Dewayne Shorter, RN 06/06/22 1315): UDS 04-11-2022 Mr 09-14-2021            Patient Active Problem List   Diagnosis Date Noted   Lumbar spondylosis 04/11/2022   Lumbar degenerative disc disease 04/11/2022   Bilateral primary osteoarthritis of knee 04/11/2022   Pain  management contract signed 04/11/2022   Chronic pain syndrome 04/11/2022   Syncope 03/15/2022   Dysuria 03/15/2022   GERD (gastroesophageal reflux disease) 03/15/2022   Pressure injury  of skin 03/15/2022   Pressure injury of left elbow, stage 2 ([place]) 01/31/2022   Urinary retention 12/06/2021   Depression with anxiety 11/01/2021   Pressure injury of skin of sacral region 03/29/2021   Pressure injury of left buttock, stage 2 ([place]) 03/29/2021   Cellulitis of left upper extremity 02/08/2021   Pressure injury of left elbow, stage 1 02/08/2021   Other insomnia 02/08/2021   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   Obesity (BMI 30.0-34.9) 06/29/2020   Acute non-recurrent sinusitis 05/11/2020   Need for influenza vaccination 05/11/2020   Paroxysmal atrial fibrillation ([place]) 05/11/2020   Benign prostatic hyperplasia with urinary hesitancy 05/11/2020   Venous ulcer, limited to breakdown of skin ([place]) 02/06/2020   Swelling of lower extremity 02/06/2020   Noise effect on both inner ears 08/12/2019   Presbycusis of both ears 08/12/2019   Left ear pain 05/20/2019   Pressure injury of right buttock, stage 1 05/20/2019   Debilitated 05/20/2019   Elevated glucose 04/08/2019   Osteoarthritis of right knee 03/25/2019   Serum potassium elevated 03/18/2019   Mixed hyperlipidemia 09/24/2018   Gait disturbance 09/24/2018   Anxiety 04/30/2018   Chronic radicular lumbar pain 02/14/2018   Pain in joint of left shoulder 12/28/2017   Osteoarthritis of left glenohumeral joint 12/28/2017   Pain in left foot 10/23/2017   History of cerebrovascular accident 10/18/2017   Coronary artery disease involving native coronary artery of native heart without angina pectoris    Persistent atrial fibrillation ([place])    Dysarthria, post-stroke    Dysphagia, post-stroke    Cerebrovascular accident (CVA) due to thrombosis of precerebral artery ([place])    Scoliosis (and kyphoscoliosis), idiopathic 01/26/2014    Arthralgia of hip 02/07/2012   Osteoarthritis of hip 02/07/2012   Sacroiliitis ([place]) 02/07/2012   ROTATOR CUFF SYNDROME, LEFT 03/07/2010   OTH MALIG NEOPLASM SKIN OTH&UNSPEC PARTS FACE 12/06/2009   ACTINIC KERATOSIS, HEAD 09/06/2009   Osteoarthritis 02/20/2009   History of mitral valve repair 02/20/2009   HIP REPLACEMENT, TOTAL, HX OF 02/20/2009   APPENDECTOMY, HX OF 02/20/2009   HERNIORRHAPHY, HX OF 02/20/2009   EPISTAXIS, RECURRENT 11/19/2008   UNS ADVRS EFF UNS RX MEDICINAL&BIOLOGICAL SBSTNC 06/12/2008   OTHER AND UNSPECIFIED MITRAL VALVE DISEASES 03/12/2008   ATRIAL FLUTTER 03/12/2008   Essential hypertension 03/06/2007   PEPTIC ULCER DISEASE 03/06/2007   BENIGN PROSTATIC HYPERTROPHY 03/06/2007   Coronary atherosclerosis 02/13/2007   ROSACEA 02/13/2007   History of peristent atrial fibrillation 02/13/2007   DVT, HX OF 02/13/2007    Immunization History  Administered Date(s) Administered   Fluad Quad(high Dose 65+) 05/11/2020   H1N1 09/01/2008   Influenza Inj Mdck Quad Pf 05/27/2018   Influenza Split 05/15/2011   Influenza Whole 04/26/2009, 04/25/2013   Influenza, High Dose Seasonal PF 05/17/2017, 05/12/2019, 05/12/2019   Influenza,inj,Quad PF,6+ Mos 05/10/2016   Influenza-Unspecified 05/03/2014, 05/12/2015, 06/14/2017, 03/29/2021   PFIZER(Purple Top)SARS-COV-2 Vaccination 11/20/2019, 12/19/2019, 05/30/2020   Pneumococcal Conjugate-13 07/21/2013   Pneumococcal Polysaccharide-23 09/04/2007   Td 09/04/2007, 03/12/2008   Tdap 05/03/2014   Zoster, Live 01/27/2011    Conditions to be addressed/monitored:  Hypertension, Hyperlipidemia, and Atrial Fibrillation  Care Plan : General Pharmacy (Adult)  Updates made by Germaine Pomfret, RPH since 07/20/2022 12:00 AM     Problem: Hypertension, Hyperlipidemia, and Atrial Fibrillation   Priority: High     Long-Range Goal: Patient-Specific Goal   Start Date: 03/28/2021  Expected End Date: 07/21/2023  This Visit's Progress:  On track  Recent Progress: On track  Priority: High  Note:   Current Barriers:  No Barriers Noted  Pharmacist Clinical Goal(s):  Patient will maintain control of blood pressure as evidenced by BP less than 140/90  through collaboration with PharmD and provider.   Interventions: 1:1 collaboration with Libby Maw, MD regarding development and update of comprehensive plan of care as evidenced by provider attestation and co-signature Inter-disciplinary care team collaboration (see longitudinal plan of care) Comprehensive medication review performed; medication list updated in electronic medical record  Hypertension (BP goal <140/90) -Controlled -Current treatment: Losartan 25 mg daily: Appropriate, Effective, Safe, Accessible Metoprolol XL 25 mg daily: Appropriate, Effective, Safe, Accessible  -Medications previously tried: NA  -Current home readings: NA -Denies hypotensive/hypertensive symptoms -Educated on Daily salt intake goal < 2300 mg; -Counseled to monitor BP at home weekly, document, and provide log at future appointments -Recommended to continue current medication  Hyperlipidemia: (LDL goal < 100) -Controlled -Current treatment: Pravastatin 40 mg daily  -Medications previously tried: NA  -Recommended to continue current medication  Atrial Fibrillation (Goal: prevent stroke and major bleeding) -Controlled -Current treatment: Rate control: Metoprolol XL 25 mg daily  Anticoagulation: Eliquis 2.5 mg twice daily -Medications previously tried: NA -Recommended to continue current medication  Chtronic Pain (Goal: Minimize pain symptoms) -Uncontrolled -Current treatment  Pregabalin 25 mg twice daily: Appropriate, Query effective  Oxycodone 7.5-325 mg twice daily: Appropriate, Query effective   -Medications previously tried: NA  -Confirmed with patient daughter [name] that new oxycodone and pregabalin prescriptions were picked up from pharmacy and placed in  pillbox twice daily.  -Daughter and grandson Quita Skye had concerns with strength and frequency of pain medication  -Patient has not heard about pain management referral. -Patient reports no relief from recent pain medications. He denies symptoms of oversedation or other signs of CNS depression.  -Patient is interested in increasing PT to twice weekly with home PT if possible.  -Recommended to continue current medication  Patient Goals/Self-Care Activities Patient will:  - check blood pressure weekly, document, and provide at future appointments  Follow Up Plan: Telephone follow up appointment with care management team member scheduled for:  06/23/2023 at 3:45 PM     Medication Assistance: None required.  Patient affirms current coverage meets needs.  Compliance/Adherence/Medication fill history: Care Gaps: Shingrix  Covid Vaccine Influenza   Star-Rating Drugs: Losartan 25 mg daily 03/11/21 for 30-DS   Patient's preferred pharmacy is:  [id number] [place] [place] AT [facility] [address] [place] [address] Phone: [phone] Fax: [phone]  [facility] [place], [place] [place] [address] Phone: [phone] Fax: [phone]  Uses pill box? Yes Pt endorses 100% compliance  We discussed: Current pharmacy is preferred with insurance plan and patient is satisfied with pharmacy services Patient decided to: Continue current medication management strategy  Care Plan and Follow Up Patient Decision:  Patient agrees to Care Plan and Follow-up.  Plan: Telephone follow up appointment with care management team member scheduled for:  06/23/2023 at 3:45 PM  Junius Argyle, PharmD, [place], CPP Clinical Pharmacist Practitioner  [facility]  [phone]

## 2022-06-25 ENCOUNTER — Other Ambulatory Visit: Payer: Self-pay | Admitting: Family Medicine

## 2022-06-27 ENCOUNTER — Encounter (HOSPITAL_BASED_OUTPATIENT_CLINIC_OR_DEPARTMENT_OTHER): Payer: Medicare Other | Attending: General Surgery | Admitting: General Surgery

## 2022-06-27 DIAGNOSIS — I2510 Atherosclerotic heart disease of native coronary artery without angina pectoris: Secondary | ICD-10-CM | POA: Insufficient documentation

## 2022-06-27 DIAGNOSIS — M1990 Unspecified osteoarthritis, unspecified site: Secondary | ICD-10-CM | POA: Diagnosis not present

## 2022-06-27 DIAGNOSIS — I10 Essential (primary) hypertension: Secondary | ICD-10-CM | POA: Diagnosis not present

## 2022-06-27 DIAGNOSIS — I4819 Other persistent atrial fibrillation: Secondary | ICD-10-CM | POA: Insufficient documentation

## 2022-06-27 DIAGNOSIS — L89893 Pressure ulcer of other site, stage 3: Secondary | ICD-10-CM | POA: Diagnosis not present

## 2022-06-27 DIAGNOSIS — L89023 Pressure ulcer of left elbow, stage 3: Secondary | ICD-10-CM | POA: Insufficient documentation

## 2022-07-03 NOTE — Progress Notes (Signed)
CARY, LOTHROP Jerilynn Mages ([id number]) [id number] Page 1 of 9 Visit Report for 06/27/2022 Chief Complaint Document Details Patient Name: Date of Service: [name] ID M. 06/27/2022 1:15 PM Medical Record Number: [id number] Patient Account Number: [id number] Date of Birth/Sex: Treating RN: 1932-09-02 (88 y.o. M) Primary Care Provider: Abelino Derrick Other Clinician: Referring Provider: Treating Provider/Extender: Sandi Raveling in Treatment: 19 Information Obtained from: Patient Chief Complaint 08/05/2019; patient is here for review of pressure ulcers on his buttock 02/14/22: patient is here for review of pressure ulcers on his left elbow Electronic Signature(s) Signed: 06/27/2022 1:34:44 PM By: Fredirick Maudlin MD FACS Entered By: Fredirick Maudlin on 06/27/2022 13:34:43 -------------------------------------------------------------------------------- Debridement Details Patient Name: Date of Service: [name], DA V ID M. 06/27/2022 1:15 PM Medical Record Number: [id number] Patient Account Number: [id number] Date of Birth/Sex: Treating RN: 1932/09/02 (88 y.o. [name] Primary Care Provider: Abelino Derrick Other Clinician: Referring Provider: Treating Provider/Extender: Sandi Raveling in Treatment: 19 Debridement Performed for Assessment: Wound #1 Left Elbow Performed By: Physician Fredirick Maudlin, MD Debridement Type: Debridement Level of Consciousness (Pre-procedure): Awake and Alert Pre-procedure Verification/Time Out Yes - 13:31 Taken: Start Time: 13:32 Pain Control: Lidocaine 5% topical ointment T Area Debrided (L x W): otal 2.5 (cm) x 1.8 (cm) = 4.5 (cm) Tissue and other material debrided: Non-Viable, Slough, Slough Level: Non-Viable Tissue Debridement Description: Selective/Open Wound Instrument: Curette Bleeding: Minimum Hemostasis Achieved: Pressure Response to Treatment: Procedure was  tolerated well Level of Consciousness (Post- Awake and Alert procedure): Post Debridement Measurements of Total Wound Length: (cm) 2.5 Stage: Category/Stage III Width: (cm) 1.8 Depth: (cm) 0.1 Volume: (cm) 0.353 Character of Wound/Ulcer Post Debridement: Requires Further Debridement Post Procedure Diagnosis Same as Pre-procedure Brian [name] ([id number]) [id number] Page 2 of 9 Notes Scribed for Dr. Celine Ahr by Blanche East, RN Electronic Signature(s) Signed: 06/27/2022 1:38:29 PM By: Fredirick Maudlin MD FACS Signed: 07/03/2022 5:04:49 PM By: Blanche East RN Entered By: Blanche East on 06/27/2022 13:32:15 -------------------------------------------------------------------------------- HPI Details Patient Name: Date of Service: [name], DA V ID M. 06/27/2022 1:15 PM Medical Record Number: [id number] Patient Account Number: [id number] Date of Birth/Sex: Treating RN: 10-31-1932 (88 y.o. M) Primary Care Provider: Abelino Derrick Other Clinician: Referring Provider: Treating Provider/Extender: Sandi Raveling in Treatment: 19 History of Present Illness HPI Description: ADMISSION 08/05/19 This is an 88 year old man who arrives in clinic accompanied by his son. 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 Apparently noted recently to have a stage I pressure area on the left buttock. He does not have a wound history. He does have been air fluidized mattress. They have a wheelchair cushion as well as a pillow over the top of this. He is not incontinent of urine or bowel. Past medical history includes hypertension, osteoarthritis of the knee, atrial fibrillation, history of mitral valve repair, right hemisphere CVA with left hemiparesis, history of a DVT READMISSION 02/14/2022 The patient is here today for evaluation of pressure ulcers on his left elbow and possible right buttock ulcer. Apparently  when he was seen by Dr. Dellia Nims in 2020, no ulcer was appreciated on the buttock and no further wound care involvement occurred. 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 He does have home health aides and they are turning him every 2  hours side to side. He is on a memory foam mattress at this time. 03/14/2022: The patient was scheduled to see me on July 11, but apparently his son panicked about some bright red blood per rectum and rushed him to the emergency room. There is also a comment in the electronic medical record that the patient's son thought the bone was exposed on his elbow. After waiting a prolonged period of time at the emergency department, they left without being seen. Today, it 86 appears that the dressing on the patient's elbow was never changed since our last visit. The 2 wounds have converged creating a larger wound. According to the intake nurse, there was a foul odor from the wound and dressing. There is extensive slough on the wound surface. The bleeding per rectum turns out to be hemorrhoids. The patient's son expresses that he does not know how to manage this. 04/07/2022: For unclear reasons, the patient has not returned to clinic until they were contacted today and they made an add-on visit. The patient's son was not originally present at the beginning of the visit but showed up about 25 minutes into the visit. The patient was accompanied by a home aide. She reported that when she cleaned the patient up today, there was just a Band-Aid on his wound. She said the surface was fairly mucky but she was able to wash this off. It is abundantly clear that this wound is not being properly cared for. Apparently he did have [facility] home health but they have not been out in some time and it is not clear the reason for this. The surface of the wound is a bit cleaner so  perhaps the Santyl has been applied, but unfortunately there is now a deep tunnel that extends up from the elbow to the patient's posterior upper arm for a number centimeters. There is no evidence of infection or purulent drainage. It is also clear that the patient continues to spend a substantial portion of his time leaning on the elbow. 05/23/2022: Once again, the patient has failed to return to clinic for about 6 weeks and the reason remains unclear. He is apparently receiving home health assistance. The wound on his elbow is covered with hypertrophic granulation tissue. He also has a blister just distal to the wound on his posterior forearm. He has a new stage III pressure ulcer on his left gluteus with slough accumulation. He apparently spends a substantial portion of his days sitting in a wheelchair. I do not believe he has a Financial trader. Due to his stroke, he lists to the left side, which is how his ulcer on his elbow developed. I suspect the gluteal ulcer is as a result of this, as well. 06/13/2022: The wound on his left gluteus is nearly healed. It is very superficial and quite clean. His son reports that he has just been applying Desitin to the area. The elbow wound is smaller but still has some hypertrophic granulation tissue and slough accumulation. No concern for infection at either site. 06/27/2022: The buttock wounds are healed. There is some tissue maceration without any significant skin breakdown. The elbow wound continues to contract. There is still some hypertrophic granulation tissue and slough buildup. Electronic Signature(s) Signed: 06/27/2022 1:35:26 PM By: Fredirick Maudlin MD FACS Entered By: Fredirick Maudlin on 06/27/2022 13:35:25 Rede, Eoghan M ([id number]) [id number] Page 3 of 9 -------------------------------------------------------------------------------- Physical Exam Details Patient Name: Date of Service: [name] ID M. 06/27/2022  1:15 PM Medical Record  Number: [id number] Patient Account Number: [id number] Date of Birth/Sex: Treating RN: 11/25/1932 (88 y.o. M) Primary Care Provider: Abelino Derrick Other Clinician: Referring Provider: Treating Provider/Extender: Sandi Raveling in Treatment: 19 Constitutional . Slightly tachycardic, asymptomatic. . . No acute distress.Marland Kitchen Respiratory Normal work of breathing on room air.. Notes 06/27/2022: The buttock wounds are healed. There is some tissue maceration without any significant skin breakdown. The elbow wound continues to contract. There is still some hypertrophic granulation tissue and slough buildup. Electronic Signature(s) Signed: 06/27/2022 1:36:29 PM By: Fredirick Maudlin MD FACS Entered By: Fredirick Maudlin on 06/27/2022 13:36:29 -------------------------------------------------------------------------------- Physician Orders Details Patient Name: Date of Service: [name] ID M. 06/27/2022 1:15 PM Medical Record Number: [id number] Patient Account Number: [id number] Date of Birth/Sex: Treating RN: 1933-01-26 (88 y.o. [name] Primary Care Provider: Abelino Derrick Other Clinician: Referring Provider: Treating Provider/Extender: Sandi Raveling in Treatment: [phone] Verbal / Phone Orders: No Diagnosis Coding ICD-10 Coding Code Description L89.023 Pressure ulcer of left elbow, stage 3 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 - Dr. Celine Ahr RM 3 Tuesday Bathing/ Shower/ Hygiene May shower and wash wound with soap and water. - with dressing changes Off-Loading Turn and reposition every 2 hours - stay off of left elbow wear prevalon boot on elbow as directed Other: - limit time in  wheelchair, reposition in wheelchair every 1-2 hours [place] #1 Left Elbow New wound care orders this week; continue Home Health for wound care. May utilize formulary equivalent dressing for wound treatment orders unless otherwise specified. - change dressings to hydrofera blue ready Discontinue home health for wound care. - HH is d/c pt per patients request. Other Home Health Orders/Instructions: Askari Kinley, Mariano Jerilynn Mages ([id number]) [id number] Page 4 of 9 Wound Treatment Wound #1 - Elbow Wound Laterality: Left Cleanser: Soap and Water Every Other Day/30 Days Discharge Instructions: May shower and wash wound with dial antibacterial soap and water prior to dressing change. Cleanser: Wound Cleanser Every Other Day/30 Days Discharge Instructions: Cleanse the wound with wound cleanser prior to applying a clean dressing using gauze sponges, not tissue or cotton balls. Prim Dressing: Hydrofera Blue Ready Foam, 2.5 x2.5 in Every Other Day/30 Days ary Discharge Instructions: Apply to wound bed as instructed Secondary Dressing: ALLEVYN Gentle Border, 5x5 (in/in) (Dispense As Written) Every Other Day/30 Days Discharge Instructions: Apply over primary dressing as directed. Add-Ons: Cotton Tip Applicator, 6 (in) (Generic) Every Other Day/30 Days Wound #2 - Gluteus Wound Laterality: Left Prim Dressing: Hydrofera Blue Ready Foam, 2.5 x2.5 in Every Other Day/30 Days ary Discharge Instructions: Apply to wound bed as instructed Secondary Dressing: ALLEVYN Gentle Border, 3x3 (in/in) Every Other Day/30 Days Discharge Instructions: Apply over primary dressing as directed. Electronic Signature(s) Signed: 06/28/2022 8:42:07 AM By: Fredirick Maudlin MD FACS Signed: 07/03/2022 5:04:49 PM By: Blanche East RN Previous Signature: 06/27/2022 1:38:29 PM Version By: Fredirick Maudlin MD FACS Entered By: Blanche East on 06/28/2022  08:35:37 -------------------------------------------------------------------------------- Problem List Details Patient Name: Date of Service: [name] ID M. 06/27/2022 1:15 PM Medical Record Number: [id number] Patient Account Number: [id number] Date of Birth/Sex: Treating RN: Nov 15, 1932 (88 y.o. M) Primary Care Provider: Abelino Derrick Other Clinician: Referring Provider: Treating Provider/Extender: Sandi Raveling in Treatment: 00 Active Problems ICD-10 Encounter Code Description Active Date  MDM Diagnosis L89.023 Pressure ulcer of left elbow, stage 3 02/14/2022 No Yes 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 02/14/2022 No Yes I48.19 Other persistent atrial fibrillation 02/14/2022 No Yes I25.10 Atherosclerotic heart disease of native coronary artery without angina pectoris 02/14/2022 No Yes Inactive Problems ICD-10 Code Description Active Date Inactive Date L89.323 Pressure ulcer of left buttock, stage 3 05/23/2022 05/23/2022 Brian [name] ([id number]) [id number] Page 5 of 9 Resolved Problems Electronic Signature(s) Signed: 06/27/2022 1:34:32 PM By: Fredirick Maudlin MD FACS Entered By: Fredirick Maudlin on 06/27/2022 13:34:32 -------------------------------------------------------------------------------- Progress Note Details Patient Name: Date of Service: [name] ID M. 06/27/2022 1:15 PM Medical Record Number: [id number] Patient Account Number: [id number] Date of Birth/Sex: Treating RN: [birth year] (88 y.o. M) Primary Care Provider: Abelino Derrick Other Clinician: Referring Provider: Treating Provider/Extender: Sandi Raveling in Treatment: 19 Subjective Chief Complaint Information obtained from Patient 08/05/2019; patient is here for review of pressure ulcers on his buttock 02/14/22: patient is here for review of pressure ulcers on his left elbow History  of Present Illness (HPI) ADMISSION 08/05/19 This is an 88 year old man who arrives in clinic accompanied by his son. 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 Apparently noted recently to have a stage I pressure area on the left buttock. He does not have a wound history. He does have been air fluidized mattress. They have a wheelchair cushion as well as a pillow over the top of this. He is not incontinent of urine or bowel. Past medical history includes hypertension, osteoarthritis of the knee, atrial fibrillation, history of mitral valve repair, right hemisphere CVA with left hemiparesis, history of a DVT READMISSION 02/14/2022 The patient is here today for evaluation of pressure ulcers on his left elbow and possible right buttock ulcer. Apparently when he was seen by Dr. Dellia Nims in 2020, no ulcer was appreciated on the buttock and no further wound care involvement occurred. 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 He does have home health aides and they are turning him every 2 hours side to side. He is on a memory foam mattress at this time. 03/14/2022: The patient was scheduled to see me on July 11, but apparently his son panicked about some bright red blood per rectum and rushed him to the emergency room. There is also a comment in the electronic medical record that the patient's son thought the bone was exposed on his elbow. After waiting a prolonged period of time at the emergency department, they left without being seen. Today, it 86 appears that the dressing on the patient's elbow was never changed since our last visit. The 2 wounds have converged creating a larger wound. According to the intake nurse, there was a foul odor from the wound and dressing. There is extensive slough on the wound surface. The bleeding per rectum turns out  to be hemorrhoids. The patient's son expresses that he does not know how to manage this. 04/07/2022: For unclear reasons, the patient has not returned to clinic until they were contacted today and they made an add-on visit. The patient's son was not originally present at the beginning of the visit but showed up about 25 minutes into the visit. The patient was accompanied by a home aide. She reported that when she cleaned the patient up today, there  was just a Band-Aid on his wound. She said the surface was fairly mucky but she was able to wash this off. It is abundantly clear that this wound is not being properly cared for. Apparently he did have [facility] home health but they have not been out in some time and it is not clear the reason for this. The surface of the wound is a bit cleaner so perhaps the Santyl has been applied, but unfortunately there is now a deep tunnel that extends up from the elbow to the patient's posterior upper arm for a number centimeters. There is no evidence of infection or purulent drainage. It is also clear that the patient continues to spend a substantial portion of his time leaning on the elbow. 05/23/2022: Once again, the patient has failed to return to clinic for about 6 weeks and the reason remains unclear. He is apparently receiving home health assistance. The wound on his elbow is covered with hypertrophic granulation tissue. He also has a blister just distal to the wound on his posterior forearm. He has a new stage III pressure ulcer on his left gluteus with slough accumulation. He apparently spends a substantial portion of his days sitting in a wheelchair. I do not believe he has a Financial trader. Due to his stroke, he lists to the left side, which is how his ulcer on his elbow developed. I suspect the gluteal ulcer is as a result of this, as well. 06/13/2022: The wound on his left gluteus is nearly healed. It is very superficial and quite clean. His son reports that he  has just been applying Desitin to the area. The elbow wound is smaller but still has some hypertrophic granulation tissue and slough accumulation. No concern for infection at either site. 06/27/2022: The buttock wounds are healed. There is some tissue maceration without any significant skin breakdown. The elbow wound continues to contract. There is still some hypertrophic granulation tissue and slough buildup. Patient History Information obtained from Patient. Family History Diabetes - Maternal Grandparents, Heart Disease - Mother, Hypertension - Mother, Brian [name], Brian [name] ([id number]) [id number] Page 6 of 9 No family history of Cancer, Hereditary Spherocytosis, Kidney Disease, Lung Disease, Seizures, Stroke, Thyroid Problems, Tuberculosis. Social History Never smoker, Marital Status - Married, Alcohol Use - Never, Drug Use - No History, Caffeine Use - Never. Medical History Eyes Patient has history of Cataracts - removed Cardiovascular Patient has history of Arrhythmia - Atrial Flutter/Atrial Fib, Coronary Artery Disease, Hypotension Denies history of Hypertension Musculoskeletal Patient has history of Osteoarthritis Neurologic Denies history of Paraplegia Hospitalization/Surgery History - open heart surgery. - IR generic histoircal. - appendectomy. - hernia repair. - joint replacement. - mitral valve repair. - mvp repair. - total hip arthroplasty. Medical A Surgical History Notes nd Constitutional Symptoms (General Health) obseity Ear/Nose/Mouth/Throat wears hearing aids Cardiovascular Mitral Valve Repair, Genitourinary BPH Neurologic CVA 2018, left side hemiparesis Objective Constitutional Slightly tachycardic, asymptomatic. No acute distress.. Vitals Time Taken: 1:12 PM, Height: 66 in, Weight: 185 lbs, BMI: 29.9, Temperature: 97.8 F, Pulse: 103 bpm, Respiratory Rate: 20 breaths/min, Blood Pressure: 118/56 mmHg. Respiratory Normal work of  breathing on room air.. General Notes: 06/27/2022: The buttock wounds are healed. There is some tissue maceration without any significant skin breakdown. The elbow wound continues to contract. There is still some hypertrophic granulation tissue and slough buildup. Integumentary (Hair, Skin) Wound #1 status is Open. Original cause of wound was Pressure Injury. The date acquired was: 12/19/2021. The wound has been in treatment 19  weeks. The wound is located on the Left Elbow. The wound measures 2.5cm length x 1.8cm width x 0.1cm depth; 3.534cm^2 area and 0.353cm^3 volume. There is Fat Layer (Subcutaneous Tissue) exposed. There is no undermining noted, however, there is tunneling at 3:00 with a maximum distance of 0.4cm. There is a medium amount of serosanguineous drainage noted. The wound margin is distinct with the outline attached to the wound base. There is large (67-100%) red, hyper - granulation within the wound bed. There is a small (1-33%) amount of necrotic tissue within the wound bed including Adherent Slough. The periwound skin appearance had no abnormalities noted for texture. The periwound skin appearance had no abnormalities noted for moisture. The periwound skin appearance had no abnormalities noted for color. Periwound temperature was noted as No Abnormality. The periwound has tenderness on palpation. Wound #2 status is Open. Original cause of wound was Pressure Injury. The date acquired was: 04/21/2022. The wound has been in treatment 5 weeks. The wound is located on the Left Gluteus. The wound measures 0.1cm length x 0.1cm width x 0.1cm depth; 0.008cm^2 area and 0.001cm^3 volume. There is Fat Layer (Subcutaneous Tissue) exposed. There is no tunneling or undermining noted. There is a medium amount of serosanguineous drainage noted. The wound margin is flat and intact. There is large (67-100%) red granulation within the wound bed. There is a small (1-33%) amount of necrotic tissue within the  wound bed including Adherent Slough. The periwound skin appearance had no abnormalities noted for color. The periwound skin appearance exhibited: Rash, Dry/Scaly. The periwound skin appearance did not exhibit: Maceration. Periwound temperature was noted as No Abnormality. The periwound has tenderness on palpation. Assessment Active Problems ICD-10 Pressure ulcer of left elbow, stage 3 Cerebral infarction due to thrombosis of unspecified precerebral artery Other persistent atrial fibrillation Atherosclerotic heart disease of native coronary artery without angina pectoris Brian [name] ([id number]) [id number] Page 7 of 9 Procedures Wound #1 Pre-procedure diagnosis of Wound #1 is a Pressure Ulcer located on the Left Elbow . There was a Selective/Open Wound Non-Viable Tissue Debridement with a total area of 4.5 sq cm performed by Fredirick Maudlin, MD. With the following instrument(s): Curette to remove Non-Viable tissue/material. Material removed includes [facility] after achieving pain control using Lidocaine 5% topical ointment. No specimens were taken. A time out was conducted at 13:31, prior to the start of the procedure. A Minimum amount of bleeding was controlled with Pressure. The procedure was tolerated well. Post Debridement Measurements: 2.5cm length x 1.8cm width x 0.1cm depth; 0.353cm^3 volume. Post debridement Stage noted as Category/Stage III. Character of Wound/Ulcer Post Debridement requires further debridement. Post procedure Diagnosis Wound #1: Same as Pre-Procedure General Notes: Scribed for Dr. Celine Ahr by Blanche East, RN. Plan Follow-up Appointments: Return appointment in 3 weeks. - Dr. Celine Ahr RM 3 Tuesday Bathing/ Shower/ Hygiene: May shower and wash wound with soap and water. - with dressing changes Off-Loading: Turn and reposition every 2 hours - stay off of left elbow wear prevalon boot on elbow as directed Other: - limit time in wheelchair,  reposition in wheelchair every 1-2 hours Home Health: Wound #1 Left Elbow: New wound care orders this week; continue Home Health for wound care. May utilize formulary equivalent dressing for wound treatment orders unless otherwise specified. - change dressings to hydrofera blue ready Other Home Health Orders/Instructions: - EHUDJSH WOUND #1: - Elbow Wound Laterality: Left Cleanser: Soap and Water Every Other Day/30 Days Discharge Instructions: May shower and wash wound with dial antibacterial soap  and water prior to dressing change. Cleanser: Wound Cleanser Every Other Day/30 Days Discharge Instructions: Cleanse the wound with wound cleanser prior to applying a clean dressing using gauze sponges, not tissue or cotton balls. Prim Dressing: Hydrofera Blue Ready Foam, 2.5 x2.5 in Every Other Day/30 Days ary Discharge Instructions: Apply to wound bed as instructed Secondary Dressing: ALLEVYN Gentle Border, 5x5 (in/in) (Dispense As Written) Every Other Day/30 Days Discharge Instructions: Apply over primary dressing as directed. Add-Ons: Cotton Tip Applicator, 6 (in) (Generic) Every Other Day/30 Days WOUND #2: - Gluteus Wound Laterality: Left Prim Dressing: Hydrofera Blue Ready Foam, 2.5 x2.5 in Every Other Day/30 Days ary Discharge Instructions: Apply to wound bed as instructed Secondary Dressing: ALLEVYN Gentle Border, 3x3 (in/in) Every Other Day/30 Days Discharge Instructions: Apply over primary dressing as directed. 06/27/2022: The buttock wounds are healed. There is some tissue maceration without any significant skin breakdown. The elbow wound continues to contract. There is still some hypertrophic granulation tissue and slough buildup. I used a curette to debride slough off of the elbow wound. We will continue to apply Hydrofera Blue to the site. I recommended that they continue to apply Desitin or zinc oxide to the buttocks to keep them dry and protect them from moisture and friction.  Follow-up in about 2 weeks. Electronic Signature(s) Signed: 06/27/2022 1:37:37 PM By: Fredirick Maudlin MD FACS Entered By: Fredirick Maudlin on 06/27/2022 13:37:36 -------------------------------------------------------------------------------- HxROS Details Patient Name: Date of Service: [name], DA V ID M. 06/27/2022 1:15 PM Medical Record Number: [id number] Patient Account Number: [id number] Date of Birth/Sex: Treating RN: [birth year] (88 y.o. M) Primary Care Provider: Abelino Derrick Other Clinician: Referring Provider: Treating Provider/Extender: Sandi Raveling in Treatment: [address] Information Obtained From Patient Brian [name], Brian [name] ([id number]) [id number] Page 8 of 9 Constitutional Symptoms (General Health) Medical History: Past Medical History Notes: obseity Eyes Medical History: Positive for: Cataracts - removed Ear/Nose/Mouth/Throat Medical History: Past Medical History Notes: wears hearing aids Cardiovascular Medical History: Positive for: Arrhythmia - Atrial Flutter/Atrial Fib; Coronary Artery Disease; Hypotension Negative for: Hypertension Past Medical History Notes: Mitral Valve Repair, Genitourinary Medical History: Past Medical History Notes: BPH Musculoskeletal Medical History: Positive for: Osteoarthritis Neurologic Medical History: Negative for: Paraplegia Past Medical History Notes: CVA 2018, left side hemiparesis HBO Extended History Items Eyes: Cataracts Immunizations Pneumococcal Vaccine: Received Pneumococcal Vaccination: Yes Received Pneumococcal Vaccination On or After 60th Birthday: Yes Implantable Devices None Hospitalization / Surgery History Type of Hospitalization/Surgery open heart surgery IR generic histoircal appendectomy hernia repair joint replacement mitral valve repair mvp repair total hip arthroplasty Family and Social History Cancer: No; Diabetes: Yes - Maternal  Grandparents; Heart Disease: Yes - Mother; Hereditary Spherocytosis: No; Hypertension: Yes - Mother; Kidney Disease: No; Lung Disease: No; Seizures: No; Stroke: No; Thyroid Problems: No; Tuberculosis: No; Never smoker; Marital Status - Married; Alcohol Use: Never; Drug Use: No History; Caffeine Use: Never; Financial Concerns: No; Food, Clothing or Shelter Needs: No; Support System Lacking: No; Transportation Concerns: No Electronic Signature(s) Signed: 06/27/2022 1:38:29 PM By: Fredirick Maudlin MD FACS Entered By: Fredirick Maudlin on 06/27/2022 13:36:03 [place], Artavious M ([id number]) [id number] Page 9 of 9 -------------------------------------------------------------------------------- SuperBill Details Patient Name: Date of Service: [name] ID M. 06/27/2022 Medical Record Number: [id number] Patient Account Number: [id number] Date of Birth/Sex: Treating RN: 27-Apr-1933 (88 y.o. M) Primary Care Provider: Abelino Derrick Other Clinician: Referring Provider: Treating Provider/Extender: Sandi Raveling in Treatment: 19 Diagnosis Coding ICD-10 Codes Code Description [phone] Pressure ulcer  of left elbow, stage 3 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 I48.19 Other persistent atrial fibrillation I25.10 Atherosclerotic heart disease of native coronary artery without angina pectoris Facility Procedures : CPT4 Code: [id number] Description: 17915 - DEBRIDE WOUND 1ST 20 SQ CM OR < ICD-10 Diagnosis Description L89.023 Pressure ulcer of left elbow, stage 3 Modifier: Quantity: 1 Physician Procedures : CPT4 Code Description Modifier [id number] - WC PHYS LEVEL 3 - EST PT 25 ICD-10 Diagnosis Description L89.023 Pressure ulcer of left elbow, stage 3 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

## 2022-07-03 NOTE — Progress Notes (Signed)
OSHEN, WLODARCZYK Jerilynn Mages ([id number]) [id number] Page 1 of 8 Visit Report for 06/27/2022 Arrival Information Details Patient Name: Date of Service: Brian [name] ID M. 06/27/2022 1:15 PM Medical Record Number: [id number] Patient Account Number: [id number] Date of Birth/Sex: Treating RN: 1933-07-01 (88 y.o. [name] Primary Care Brian [name]: Abelino Derrick Other Clinician: Referring Cassaundra Rasch: Treating Acy Orsak/Extender: Sandi Raveling in Treatment: 36 Visit Information History Since Last Visit Added or deleted any medications: No Patient Arrived: Wheel Chair Any new allergies or adverse reactions: No Arrival Time: 13:05 Had a fall or experienced change in No Accompanied By: son activities of daily living that may affect Transfer Assistance: None risk of falls: Patient Requires Transmission-Based Precautions: No Signs or symptoms of abuse/neglect since last visito No Patient Has Alerts: Yes Hospitalized since last visit: No Patient Alerts: Patient on Blood Thinner Implantable device outside of the clinic excluding No cellular tissue based products placed in the center since last visit: Has Dressing in Place as Prescribed: Yes Pain Present Now: No Electronic Signature(s) Signed: 07/03/2022 5:04:49 PM By: Blanche East RN Entered By: Blanche East on 06/27/2022 13:12:22 -------------------------------------------------------------------------------- Encounter Discharge Information Details Patient Name: Date of Service: Brian [name] ID M. 06/27/2022 1:15 PM Medical Record Number: [id number] Patient Account Number: [id number] Date of Birth/Sex: Treating RN: 07-19-1933 (88 y.o. [name] Primary Care Brian [name]: Abelino Derrick Other Clinician: Referring Kadie Balestrieri: Treating Kacia Halley/Extender: Sandi Raveling in Treatment: 19 Encounter Discharge Information Items Post Procedure Vitals Discharge Condition:  Stable Temperature (F): 97.8 Ambulatory Status: Ambulatory Pulse (bpm): 103 Discharge Destination: Home Respiratory Rate (breaths/min): 20 Transportation: Private Auto Blood Pressure (mmHg): 118/56 Accompanied By: son Schedule Follow-up Appointment: Yes Clinical Summary of Care: Electronic Signature(s) Signed: 07/03/2022 5:04:49 PM By: Blanche East RN Entered By: Blanche East on 06/27/2022 13:50:03 [place], Richardson ([id number]) [id number] Page 2 of 8 -------------------------------------------------------------------------------- Lower Extremity Assessment Details Patient Name: Date of Service: Brian [name] ID M. 06/27/2022 1:15 PM Medical Record Number: [id number] Patient Account Number: [id number] Date of Birth/Sex: Treating RN: 09-May-1933 (88 y.o. [name] Primary Care Brian [name]: Abelino Derrick Other Clinician: Referring Brian [name]: Treating Lynwood Kubisiak/Extender: Sandi Raveling in Treatment: 19 Electronic Signature(s) Signed: 07/03/2022 5:04:49 PM By: Blanche East RN Entered By: Blanche East on 06/27/2022 13:15:46 -------------------------------------------------------------------------------- Multi Wound Chart Details Patient Name: Date of Service: Brian [name] ID M. 06/27/2022 1:15 PM Medical Record Number: [id number] Patient Account Number: [id number] Date of Birth/Sex: Treating RN: [birth year] (88 y.o. M) Primary Care Clarabell Matsuoka: Abelino Derrick Other Clinician: Referring Kyshawn Teal: Treating Cheney Ewart/Extender: Sandi Raveling in Treatment: 19 Vital Signs Height(in): 66 Pulse(bpm): 103 Weight(lbs): 185 Blood Pressure(mmHg): 118/56 Body Mass Index(BMI): 29.9 Temperature(F): 97.8 Respiratory Rate(breaths/min): 20 [1:Photos:] [N/A:N/A] Left Elbow Left Gluteus N/A Wound Location: Pressure Injury Pressure Injury N/A Wounding Event: Pressure Ulcer Pressure Ulcer N/A Primary  Etiology: Cataracts, Arrhythmia, Coronary Cataracts, Arrhythmia, Coronary N/A Comorbid History: Artery Disease, Hypotension, Artery Disease, Hypotension, Osteoarthritis Osteoarthritis 12/19/2021 04/21/2022 N/A Date Acquired: 19 5 N/A Weeks of Treatment: Open Open N/A Wound Status: No No N/A Wound Recurrence: Yes No N/A Clustered Wound: 2.5x1.8x0.1 0.1x0.1x0.1 N/A Measurements L x W x D (cm) 3.534 0.008 N/A A (cm) : rea 0.353 0.001 N/A Volume (cm) : 63.30% 99.20% N/A % Reduction in A rea: 63.30% 99.00% N/A % Reduction in Volume: 3 Position 1 (o'clock): 0.4 Maximum Distance 1 (cm): Yes No N/A Tunneling: Category/Stage III Category/Stage III N/A Classification: Medium  Medium N/A Exudate A mount: Serosanguineous Serosanguineous N/A Exudate Type: Brian, [name] ([id number]) [id number] Page 3 of 8 red, brown red, brown N/A Exudate Color: Distinct, outline attached Flat and Intact N/A Wound Margin: Large (67-100%) Large (67-100%) N/A Granulation Amount: Red, Hyper-granulation Red N/A Granulation Quality: Small (1-33%) Small (1-33%) N/A Necrotic Amount: Fat Layer (Subcutaneous Tissue): Yes Fat Layer (Subcutaneous Tissue): Yes N/A Exposed Structures: Fascia: No Fascia: No Tendon: No Tendon: No Muscle: No Muscle: No Joint: No Joint: No Bone: No Bone: No Small (1-33%) Small (1-33%) N/A Epithelialization: Debridement - Selective/Open Wound N/A N/A Debridement: Pre-procedure Verification/Time Out 13:31 N/A N/A Taken: Lidocaine 5% topical ointment N/A N/A Pain Control: Slough N/A N/A Tissue Debrided: Non-Viable Tissue N/A N/A Level: 4.5 N/A N/A Debridement A (sq cm): rea Curette N/A N/A Instrument: Minimum N/A N/A Bleeding: Pressure N/A N/A Hemostasis A chieved: Procedure was tolerated well N/A N/A Debridement Treatment Response: 2.5x1.8x0.1 N/A N/A Post Debridement Measurements L x W x D (cm) 0.353 N/A N/A Post  Debridement Volume: (cm) Category/Stage III N/A N/A Post Debridement Stage: No Abnormalities Noted Rash: Yes N/A Periwound Skin Texture: No Abnormalities Noted Dry/Scaly: Yes N/A Periwound Skin Moisture: Maceration: No No Abnormalities Noted No Abnormalities Noted N/A Periwound Skin Color: No Abnormality No Abnormality N/A Temperature: Yes Yes N/A Tenderness on Palpation: Debridement N/A N/A Procedures Performed: Treatment Notes Electronic Signature(s) Signed: 06/27/2022 1:34:38 PM By: Fredirick Maudlin MD FACS Entered By: Fredirick Maudlin on 06/27/2022 13:34:38 -------------------------------------------------------------------------------- Multi-Disciplinary Care Plan Details Patient Name: Date of Service: Brian [name] ID M. 06/27/2022 1:15 PM Medical Record Number: [id number] Patient Account Number: [id number] Date of Birth/Sex: Treating RN: 1932/09/10 (88 y.o. [name] Primary Care Ahamed Hofland: Abelino Derrick Other Clinician: Referring Jermesha Sottile: Treating Conception Doebler/Extender: Sandi Raveling in Treatment: [place] reviewed with physician Active Inactive Abuse / Safety / Falls / Self Care Management Nursing Diagnoses: Impaired physical mobility Potential for falls Goals: Patient/caregiver will verbalize understanding of skin care regimen Date Initiated: 02/14/2022 Target Resolution Date: 08/20/2022 Goal Status: Active Patient/caregiver will verbalize/demonstrate measures taken to prevent injury and/or falls Date Initiated: 02/14/2022 Target Resolution Date: 08/20/2022 Goal Status: Active Interventions: Brian, [name] ([id number]) [id number] Page 4 of 8 Assess fall risk on admission and as needed Assess self care needs on admission and as needed Notes: Wound/Skin Impairment Nursing Diagnoses: Impaired tissue integrity Knowledge deficit related to ulceration/compromised skin  integrity Goals: Patient/caregiver will verbalize understanding of skin care regimen Date Initiated: 02/14/2022 Target Resolution Date: 08/20/2022 Goal Status: Active Ulcer/skin breakdown will have a volume reduction of 30% by week 4 Date Initiated: 02/14/2022 Date Inactivated: 05/23/2022 Target Resolution Date: 05/05/2022 Goal Status: Unmet Unmet Reason: new PU left glut Interventions: Assess patient/caregiver ability to obtain necessary supplies Assess patient/caregiver ability to perform ulcer/skin care regimen upon admission and as needed Assess ulceration(s) every visit Treatment Activities: Skin care regimen initiated : 02/14/2022 Topical wound management initiated : 02/14/2022 Notes: Electronic Signature(s) Signed: 07/03/2022 5:04:49 PM By: Blanche East RN Entered By: Blanche East on 06/27/2022 13:27:03 -------------------------------------------------------------------------------- Pain Assessment Details Patient Name: Date of Service: Brian [name] ID M. 06/27/2022 1:15 PM Medical Record Number: [id number] Patient Account Number: [id number] Date of Birth/Sex: Treating RN: [birth year] (88 y.o. [name] Primary Care Blondell Laperle: Abelino Derrick Other Clinician: Referring Laren Whaling: Treating Cordarrius Coad/Extender: Sandi Raveling in Treatment: 19 Active Problems Location of Pain Severity and Description of Pain Patient Has Paino Yes Site Locations Rate the pain. Current  Pain Level: 5 Character of Pain Describe the Pain: Brian, [name] ([id number]) [id number] Page 5 of 8 Pain Management and Medication Current Pain Management: Electronic Signature(s) Signed: 07/03/2022 5:04:49 PM By: Blanche East RN Entered By: Blanche East on 06/27/2022 13:15:37 -------------------------------------------------------------------------------- Patient/Caregiver Education Details Patient Name: Date of Service: Brian [name] ID  M. 11/7/2023andnbsp1:15 PM Medical Record Number: [id number] Patient Account Number: [id number] Date of Birth/Gender: Treating RN: [birth year] (89 y.o. [name] Primary Care Physician: Abelino Derrick Other Clinician: Referring Physician: Treating Physician/Extender: Sandi Raveling in Treatment: 19 Education Assessment Education Provided To: Patient Education Topics Provided Wound/Skin Impairment: Methods: Explain/Verbal Responses: Reinforcements needed, State content correctly Electronic Signature(s) Signed: 07/03/2022 5:04:49 PM By: Blanche East RN Entered By: Blanche East on 06/27/2022 13:27:17 -------------------------------------------------------------------------------- Wound Assessment Details Patient Name: Date of Service: Brian [name] ID M. 06/27/2022 1:15 PM Medical Record Number: [id number] Patient Account Number: [id number] Date of Birth/Sex: Treating RN: 07/14/1933 (88 y.o. [name] Primary Care Kinte Trim: Abelino Derrick Other Clinician: Referring Rodman Recupero: Treating Baudelia Schroepfer/Extender: Sandi Raveling in Treatment: 19 Wound Status Wound Number: 1 Primary Pressure Ulcer Etiology: Wound Location: Left Elbow Wound Status: Open Wounding Event: Pressure Injury Comorbid Cataracts, Arrhythmia, Coronary Artery Disease, Date Acquired: 12/19/2021 History: Hypotension, Osteoarthritis Weeks Of Treatment: 19 Clustered Wound: Yes Photos Brian, [name] ([id number]) [id number] Page 6 of 8 Wound Measurements Length: (cm) 2.5 Width: (cm) 1.8 Depth: (cm) 0.1 Area: (cm) 3.534 Volume: (cm) 0.353 % Reduction in Area: 63.3% % Reduction in Volume: 63.3% Epithelialization: Small (1-33%) Tunneling: Yes Position (o'clock): 3 Maximum Distance: (cm) 0.4 Undermining: No Wound Description Classification: Category/Stage III Wound Margin: Distinct, outline attached Exudate Amount:  Medium Exudate Type: Serosanguineous Exudate Color: red, brown Foul Odor After Cleansing: No Slough/Fibrino Yes Wound Bed Granulation Amount: Large (67-100%) Exposed Structure Granulation Quality: Red, Hyper-granulation Fascia Exposed: No Necrotic Amount: Small (1-33%) Fat Layer (Subcutaneous Tissue) Exposed: Yes Necrotic Quality: Adherent Slough Tendon Exposed: No Muscle Exposed: No Joint Exposed: No Bone Exposed: No Periwound Skin Texture Texture Color No Abnormalities Noted: Yes No Abnormalities Noted: Yes Moisture Temperature / Pain No Abnormalities Noted: Yes Temperature: No Abnormality Tenderness on Palpation: Yes Treatment Notes Wound #1 (Elbow) Wound Laterality: Left Cleanser Soap and Water Discharge Instruction: May shower and wash wound with dial antibacterial soap and water prior to dressing change. Wound Cleanser Discharge Instruction: Cleanse the wound with wound cleanser prior to applying a clean dressing using gauze sponges, not tissue or cotton balls. Peri-Wound Care Topical Primary Dressing Hydrofera Blue Ready Foam, 2.5 x2.5 in Discharge Instruction: Apply to wound bed as instructed Secondary Dressing ALLEVYN Gentle Border, 5x5 (in/in) Discharge Instruction: Apply over primary dressing as directed. Secured With Compression Wrap Compression Stockings Add-Ons DARIC, KOREN ([id number]) [id number] Page 7 of 8 Cotton Engineer, water, 6 (in) [employer]) Signed: 07/03/2022 5:04:49 PM By: Blanche East RN Entered By: Blanche East on 06/27/2022 13:24:02 -------------------------------------------------------------------------------- Wound Assessment Details Patient Name: Date of Service: Brian [name] ID M. 06/27/2022 1:15 PM Medical Record Number: [id number] Patient Account Number: [id number] Date of Birth/Sex: Treating RN: 1933/08/04 (88 y.o. [name] Primary Care Carlie Solorzano: Abelino Derrick Other  Clinician: Referring Damiean Lukes: Treating Omaya Nieland/Extender: Sandi Raveling in Treatment: 19 Wound Status Wound Number: 2 Primary Pressure Ulcer Etiology: Wound Location: Left Gluteus Wound Status: Open Wounding Event: Pressure Injury Comorbid Cataracts, Arrhythmia, Coronary Artery Disease, Date Acquired: 04/21/2022 History: Hypotension, Osteoarthritis Weeks Of Treatment: 5  Clustered Wound: No Photos Wound Measurements Length: (cm) 0.1 Width: (cm) 0.1 Depth: (cm) 0.1 Area: (cm) 0.008 Volume: (cm) 0.001 % Reduction in Area: 99.2% % Reduction in Volume: 99% Epithelialization: Small (1-33%) Tunneling: No Undermining: No Wound Description Classification: Category/Stage III Wound Margin: Flat and Intact Exudate Amount: Medium Exudate Type: Serosanguineous Exudate Color: red, brown Foul Odor After Cleansing: No Slough/Fibrino Yes Wound Bed Granulation Amount: Large (67-100%) Exposed Structure Granulation Quality: Red Fascia Exposed: No Necrotic Amount: Small (1-33%) Fat Layer (Subcutaneous Tissue) Exposed: Yes Necrotic Quality: Adherent Slough Tendon Exposed: No Muscle Exposed: No Joint Exposed: No Bone Exposed: No Periwound Skin Texture Texture Color No Abnormalities Noted: No No Abnormalities Noted: Yes Rash: Yes Temperature / Pain Temperature: No Abnormality Moisture No Abnormalities Noted: No Tenderness on Palpation: Yes Brian, [name] ([id number]) [id number] Page 8 of 8 Dry / Scaly: Yes Maceration: No Treatment Notes Wound #2 (Gluteus) Wound Laterality: Left Cleanser Peri-Wound Care Topical Primary Dressing Hydrofera Blue Ready Foam, 2.5 x2.5 in Discharge Instruction: Apply to wound bed as instructed Secondary Dressing ALLEVYN Gentle Border, 3x3 (in/in) Discharge Instruction: Apply over primary dressing as directed. Secured With Compression Wrap Compression Stockings Sport and exercise psychologist) Signed: 07/03/2022 5:04:49 PM By: Blanche East RN Entered By: Blanche East on 06/27/2022 13:24:24 -------------------------------------------------------------------------------- Vitals Details Patient Name: Date of Service: Brian [name], Brian V ID M. 06/27/2022 1:15 PM Medical Record Number: [id number] Patient Account Number: [id number] Date of Birth/Sex: Treating RN: [birth year] (88 y.o. [name] Primary Care Nathaniel Yaden: Abelino Derrick Other Clinician: Referring Angelica Wix: Treating Sheridyn Canino/Extender: Sandi Raveling in Treatment: 19 Vital Signs Time Taken: 13:12 Temperature (F): 97.8 Height (in): 66 Pulse (bpm): 103 Weight (lbs): 185 Respiratory Rate (breaths/min): 20 Body Mass Index (BMI): 29.9 Blood Pressure (mmHg): 118/56 Reference Range: 80 - 120 mg / dl Electronic Signature(s) Signed: 07/03/2022 5:04:49 PM By: Blanche East RN Entered By: Blanche East on 06/27/2022 13:15:17

## 2022-07-04 ENCOUNTER — Encounter (HOSPITAL_BASED_OUTPATIENT_CLINIC_OR_DEPARTMENT_OTHER): Payer: Medicare Other | Admitting: General Surgery

## 2022-07-04 DIAGNOSIS — N3941 Urge incontinence: Secondary | ICD-10-CM | POA: Diagnosis not present

## 2022-07-04 DIAGNOSIS — N481 Balanitis: Secondary | ICD-10-CM | POA: Diagnosis not present

## 2022-07-04 DIAGNOSIS — R300 Dysuria: Secondary | ICD-10-CM | POA: Diagnosis not present

## 2022-07-04 DIAGNOSIS — N3020 Other chronic cystitis without hematuria: Secondary | ICD-10-CM | POA: Diagnosis not present

## 2022-07-18 DIAGNOSIS — B356 Tinea cruris: Secondary | ICD-10-CM | POA: Diagnosis not present

## 2022-07-18 DIAGNOSIS — Z7409 Other reduced mobility: Secondary | ICD-10-CM | POA: Diagnosis not present

## 2022-07-18 DIAGNOSIS — M1990 Unspecified osteoarthritis, unspecified site: Secondary | ICD-10-CM | POA: Diagnosis not present

## 2022-07-18 DIAGNOSIS — Z87438 Personal history of other diseases of male genital organs: Secondary | ICD-10-CM | POA: Diagnosis not present

## 2022-07-18 DIAGNOSIS — G894 Chronic pain syndrome: Secondary | ICD-10-CM | POA: Diagnosis not present

## 2022-07-18 DIAGNOSIS — Z8673 Personal history of transient ischemic attack (TIA), and cerebral infarction without residual deficits: Secondary | ICD-10-CM | POA: Diagnosis not present

## 2022-07-18 DIAGNOSIS — Z952 Presence of prosthetic heart valve: Secondary | ICD-10-CM | POA: Diagnosis not present

## 2022-07-20 DIAGNOSIS — I4819 Other persistent atrial fibrillation: Secondary | ICD-10-CM

## 2022-07-20 DIAGNOSIS — I10 Essential (primary) hypertension: Secondary | ICD-10-CM

## 2022-07-20 NOTE — Patient Instructions (Signed)
Visit Information It was great speaking with you today!  Please let me know if you have any questions about our visit.   Goals Addressed             This Visit's Progress    Track and Manage My Blood Pressure-Hypertension       Timeframe:  Long-Range Goal Priority:  High Start Date:  03/28/2021                            Expected End Date: 03/29/2023                      Follow Up within 90 days   - check blood pressure weekly    Why is this important?   You won't feel high blood pressure, but it can still hurt your blood vessels.  High blood pressure can cause heart or kidney problems. It can also cause a stroke.  Making lifestyle changes like losing a little weight or eating less salt will help.  Checking your blood pressure at home and at different times of the day can help to control blood pressure.  If the doctor prescribes medicine remember to take it the way the doctor ordered.  Call the office if you cannot afford the medicine or if there are questions about it.     Notes:         Patient Care Plan: General Pharmacy (Adult)     Problem Identified: Hypertension, Hyperlipidemia, and Atrial Fibrillation   Priority: High     Long-Range Goal: Patient-Specific Goal   Start Date: 03/28/2021  Expected End Date: 07/21/2023  This Visit's Progress: On track  Recent Progress: On track  Priority: High  Note:   Current Barriers:  No Barriers Noted  Pharmacist Clinical Goal(s):  Patient will maintain control of blood pressure as evidenced by BP less than 140/90  through collaboration with PharmD and provider.   Interventions: 1:1 collaboration with Libby Maw, MD regarding development and update of comprehensive plan of care as evidenced by provider attestation and co-signature Inter-disciplinary care team collaboration (see longitudinal plan of care) Comprehensive medication review performed; medication list updated in electronic medical record  Hypertension  (BP goal <140/90) -Controlled -Current treatment: Losartan 25 mg daily: Appropriate, Effective, Safe, Accessible Metoprolol XL 25 mg daily: Appropriate, Effective, Safe, Accessible  -Medications previously tried: NA  -Current home readings: NA -Denies hypotensive/hypertensive symptoms -Educated on Daily salt intake goal < 2300 mg; -Counseled to monitor BP at home weekly, document, and provide log at future appointments -Recommended to continue current medication  Hyperlipidemia: (LDL goal < 100) -Controlled -Current treatment: Pravastatin 40 mg daily  -Medications previously tried: NA  -Recommended to continue current medication  Atrial Fibrillation (Goal: prevent stroke and major bleeding) -Controlled -Current treatment: Rate control: Metoprolol XL 25 mg daily  Anticoagulation: Eliquis 2.5 mg twice daily -Medications previously tried: NA -Recommended to continue current medication  Chtronic Pain (Goal: Minimize pain symptoms) -Uncontrolled -Current treatment  Pregabalin 25 mg twice daily: Appropriate, Query effective  Oxycodone 7.5-325 mg twice daily: Appropriate, Query effective   -Medications previously tried: NA  -Confirmed with patient daughter [name] that new oxycodone and pregabalin prescriptions were picked up from pharmacy and placed in pillbox twice daily.  -Daughter and grandson Quita Skye had concerns with strength and frequency of pain medication  -Patient has not heard about pain management referral. -Patient reports no relief from recent pain medications. He  denies symptoms of oversedation or other signs of CNS depression.  -Patient is interested in increasing PT to twice weekly with home PT if possible.  -Recommended to continue current medication  Patient Goals/Self-Care Activities Patient will:  - check blood pressure weekly, document, and provide at future appointments  Follow Up Plan: Telephone follow up appointment with care management team member scheduled  for:  06/23/2023 at 3:45 PM    Patient agreed to services and verbal consent obtained.   Patient verbalizes understanding of instructions and care plan provided today and agrees to view in [facility]. Active MyChart status and patient understanding of how to access instructions and care plan via MyChart confirmed with patient.     Junius Argyle, PharmD, Para March, CPP Clinical Pharmacist Practitioner  [facility]  [phone]

## 2022-07-25 ENCOUNTER — Encounter (HOSPITAL_BASED_OUTPATIENT_CLINIC_OR_DEPARTMENT_OTHER): Payer: Medicare Other | Attending: General Surgery | Admitting: General Surgery

## 2022-07-25 DIAGNOSIS — I2510 Atherosclerotic heart disease of native coronary artery without angina pectoris: Secondary | ICD-10-CM | POA: Diagnosis not present

## 2022-07-25 DIAGNOSIS — I6300 Cerebral infarction due to thrombosis of unspecified precerebral artery: Secondary | ICD-10-CM | POA: Insufficient documentation

## 2022-07-25 DIAGNOSIS — K5909 Other constipation: Secondary | ICD-10-CM | POA: Diagnosis not present

## 2022-07-25 DIAGNOSIS — L89023 Pressure ulcer of left elbow, stage 3: Secondary | ICD-10-CM | POA: Diagnosis not present

## 2022-07-25 DIAGNOSIS — I4819 Other persistent atrial fibrillation: Secondary | ICD-10-CM | POA: Diagnosis not present

## 2022-07-25 DIAGNOSIS — Z7401 Bed confinement status: Secondary | ICD-10-CM | POA: Diagnosis not present

## 2022-07-25 DIAGNOSIS — Z7901 Long term (current) use of anticoagulants: Secondary | ICD-10-CM | POA: Diagnosis not present

## 2022-07-25 DIAGNOSIS — F32A Depression, unspecified: Secondary | ICD-10-CM | POA: Diagnosis not present

## 2022-07-25 DIAGNOSIS — L89323 Pressure ulcer of left buttock, stage 3: Secondary | ICD-10-CM | POA: Insufficient documentation

## 2022-07-25 DIAGNOSIS — B372 Candidiasis of skin and nail: Secondary | ICD-10-CM | POA: Diagnosis not present

## 2022-07-25 DIAGNOSIS — I10 Essential (primary) hypertension: Secondary | ICD-10-CM | POA: Diagnosis not present

## 2022-07-26 DIAGNOSIS — M179 Osteoarthritis of knee, unspecified: Secondary | ICD-10-CM | POA: Diagnosis not present

## 2022-07-26 DIAGNOSIS — I4819 Other persistent atrial fibrillation: Secondary | ICD-10-CM | POA: Diagnosis not present

## 2022-07-26 DIAGNOSIS — Z952 Presence of prosthetic heart valve: Secondary | ICD-10-CM | POA: Diagnosis not present

## 2022-07-26 DIAGNOSIS — Z7901 Long term (current) use of anticoagulants: Secondary | ICD-10-CM | POA: Diagnosis not present

## 2022-07-26 DIAGNOSIS — I119 Hypertensive heart disease without heart failure: Secondary | ICD-10-CM | POA: Diagnosis not present

## 2022-07-26 DIAGNOSIS — I69354 Hemiplegia and hemiparesis following cerebral infarction affecting left non-dominant side: Secondary | ICD-10-CM | POA: Diagnosis not present

## 2022-07-26 DIAGNOSIS — L89023 Pressure ulcer of left elbow, stage 3: Secondary | ICD-10-CM | POA: Diagnosis not present

## 2022-07-26 DIAGNOSIS — I2510 Atherosclerotic heart disease of native coronary artery without angina pectoris: Secondary | ICD-10-CM | POA: Diagnosis not present

## 2022-07-26 DIAGNOSIS — L89322 Pressure ulcer of left buttock, stage 2: Secondary | ICD-10-CM | POA: Diagnosis not present

## 2022-07-26 DIAGNOSIS — Z86718 Personal history of other venous thrombosis and embolism: Secondary | ICD-10-CM | POA: Diagnosis not present

## 2022-07-26 NOTE — Progress Notes (Addendum)
Brian [name], Brian [name] ([id number]) [id number] Page 1 of 10 Visit Report for 07/25/2022 Chief Complaint Document Details Patient Name: Date of Service: Brian [name] 07/25/2022 1:15 PM Medical Record Number: [id number] Patient Account Number: [id number] Date of Birth/Sex: Treating RN: 1932-12-24 (88 y.o. [name]) Primary Care Provider: Abelino Derrick Other Clinician: Referring Provider: Treating Provider/Extender: Sandi Raveling in Treatment: 23 Information Obtained from: Patient Chief Complaint 08/05/2019; patient is here for review of pressure ulcers on his buttock 02/14/22: patient is here for review of pressure ulcers on his left elbow Electronic Signature(s) Signed: 07/25/2022 2:13:40 PM By: Fredirick Maudlin MD FACS Entered By: Fredirick Maudlin on 07/25/2022 14:13:40 -------------------------------------------------------------------------------- Debridement Details Patient Name: Date of Service: Brian [name] 07/25/2022 1:15 PM Medical Record Number: [id number] Patient Account Number: [id number] Date of Birth/Sex: Treating RN: [birth year] (89 y.o. Brian [name] Primary Care Provider: Abelino Derrick Other Clinician: Referring Provider: Treating Provider/Extender: Sandi Raveling in Treatment: 23 Debridement Performed for Assessment: Wound #1 Left Elbow Performed By: Physician Fredirick Maudlin, MD Debridement Type: Debridement Level of Consciousness (Pre-procedure): Awake and Alert Pre-procedure Verification/Time Out Yes - 13:50 Taken: Start Time: 13:50 Pain Control: Lidocaine 4% T opical Solution T Area Debrided (L x W): otal 3.5 (cm) x 3.5 (cm) = 12.25 (cm) Tissue and other material debrided: Non-Viable, Slough, Slough Level: Non-Viable Tissue Debridement Description: Selective/Open Wound Instrument: Curette Bleeding: Minimum Hemostasis Achieved: Pressure End Time: 13:52 Procedural Pain:  0 Post Procedural Pain: 0 Response to Treatment: Procedure was tolerated well Level of Consciousness (Post- Awake and Alert procedure): Post Debridement Measurements of Total Wound Length: (cm) 3.5 Stage: Category/Stage III Width: (cm) 3.5 Depth: (cm) 0.4 Volume: (cm) 3.848 Character of Wound/Ulcer Post Debridement: Improved Brian [name] ([id number]) [id number] Page 2 of 10 Post Procedure Diagnosis Same as Pre-procedure Notes Scribed for Dr. Bearl Mulberry by J.S. Electronic Signature(s) Signed: 07/25/2022 2:46:51 PM By: Fredirick Maudlin MD FACS Signed: 07/25/2022 5:09:37 PM By: Dellie Catholic RN Entered By: Dellie Catholic on 07/25/2022 13:54:22 -------------------------------------------------------------------------------- Debridement Details Patient Name: Date of Service: Brian [name] 07/25/2022 1:15 PM Medical Record Number: [id number] Patient Account Number: [id number] Date of Birth/Sex: Treating RN: 11-Aug-1933 (88 y.o. Brian [name] Primary Care Provider: Abelino Derrick Other Clinician: Referring Provider: Treating Provider/Extender: Sandi Raveling in Treatment: 23 Debridement Performed for Assessment: Wound #2 Left Gluteus Performed By: Physician Fredirick Maudlin, MD Debridement Type: Debridement Level of Consciousness (Pre-procedure): Awake and Alert Pre-procedure Verification/Time Out Yes - 13:50 Taken: Start Time: 13:50 Pain Control: Lidocaine 4% T opical Solution T Area Debrided (L x W): otal 4 (cm) x 0.8 (cm) = 3.2 (cm) Tissue and other material debrided: Non-Viable, Slough, Slough Level: Non-Viable Tissue Debridement Description: Selective/Open Wound Instrument: Curette Bleeding: Minimum Hemostasis Achieved: Pressure End Time: 13:52 Procedural Pain: 0 Post Procedural Pain: 0 Response to Treatment: Procedure was tolerated well Level of Consciousness (Post- Awake and Alert procedure): Post  Debridement Measurements of Total Wound Length: (cm) 4 Stage: Category/Stage III Width: (cm) 0.8 Depth: (cm) 0.1 Volume: (cm) 0.251 Character of Wound/Ulcer Post Debridement: Improved Post Procedure Diagnosis Same as Pre-procedure Notes Scribed for Dr. Celine Ahr by J.Scotton Electronic Signature(s) Signed: 07/25/2022 2:46:51 PM By: Fredirick Maudlin MD FACS Signed: 07/25/2022 5:09:37 PM By: Dellie Catholic RN Entered By: Dellie Catholic on 07/25/2022 13:55:28 Brian [name], Brian [name] ([id number]) [id number] Page 3 of 10 -------------------------------------------------------------------------------- HPI Details Patient Name: Date of Service: Brian [name], Brian [name] 07/25/2022 1:15 PM Medical Record Number: [id number] Patient Account Number: [id number] Date of Birth/Sex: Treating RN: 1933-05-23 (88 y.o. [name]) Primary Care Provider: Abelino Derrick Other Clinician: Referring Provider: Treating Provider/Extender: Sandi Raveling in Treatment: 23 History of Present Illness HPI Description: ADMISSION 08/05/19 This is an 88 year old man who arrives in clinic accompanied by his son. 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 Apparently noted recently to have a stage I pressure area on the left buttock. He does not have a wound history. He does have been air fluidized mattress. They have a wheelchair cushion as well as a pillow over the top of this. He is not incontinent of urine or bowel. Past medical history includes hypertension, osteoarthritis of the knee, atrial fibrillation, history of mitral valve repair, right hemisphere CVA with left hemiparesis, history of a DVT READMISSION 02/14/2022 The patient is here today for evaluation of pressure ulcers on his left elbow and possible right buttock ulcer. Apparently when he was seen by Dr. Dellia Nims in 2020, no ulcer was appreciated on the buttock and no further wound care  involvement occurred. 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 He does have home health aides and they are turning him every 2 hours side to side. He is on a memory foam mattress at this time. 03/14/2022: The patient was scheduled to see me on July 11, but apparently his son panicked about some bright red blood per rectum and rushed him to the emergency room. There is also a comment in the electronic medical record that the patient's son thought the bone was exposed on his elbow. After waiting a prolonged period of time at the emergency department, they left without being seen. Today, it appears that the dressing on the patient's elbow was never changed since our last visit. The 2 wounds have converged creating a larger wound. According to the intake nurse, there was a foul odor from the wound and dressing. There is extensive slough on the wound surface. The bleeding per rectum turns out to be hemorrhoids. The patient's son expresses that he does not know how to manage this. 04/07/2022: For unclear reasons, the patient has not returned to clinic until they were contacted today and they made an add-on visit. The patient's son was not originally present at the beginning of the visit but showed up about 25 minutes into the visit. The patient was accompanied by a home aide. She reported that when she cleaned the patient up today, there was just a Band-Aid on his wound. She said the surface was fairly mucky but she was able to wash this off. It is abundantly clear that this wound is not being properly cared for. Apparently he did have [facility] home health but they have not been out in some time and it is not clear the reason for this. The surface of the wound is a bit cleaner so perhaps the Santyl has been applied, but unfortunately there is now a deep tunnel that extends up from the  elbow to the patient's posterior upper arm for a number centimeters. There is no evidence of infection or purulent drainage. It is also clear that the patient continues to spend a substantial portion of his time leaning on the elbow. 05/23/2022: Once again, the patient has failed to return to clinic  for about 6 weeks and the reason remains unclear. He is apparently receiving home health assistance. The wound on his elbow is covered with hypertrophic granulation tissue. He also has a blister just distal to the wound on his posterior forearm. He has a new stage III pressure ulcer on his left gluteus with slough accumulation. He apparently spends a substantial portion of his days sitting in a wheelchair. I do not believe he has a Financial trader. Due to his stroke, he lists to the left side, which is how his ulcer on his elbow developed. I suspect the gluteal ulcer is as a result of this, as well. 06/13/2022: The wound on his left gluteus is nearly healed. It is very superficial and quite clean. His son reports that he has just been applying Desitin to the area. The elbow wound is smaller but still has some hypertrophic granulation tissue and slough accumulation. No concern for infection at either site. 06/27/2022: The buttock wounds are healed. There is some tissue maceration without any significant skin breakdown. The elbow wound continues to contract. There is still some hypertrophic granulation tissue and slough buildup. 07/25/2022: The buttock wounds have reopened. The periwound skin has been kept very dry through liberal use of zinc oxide, however. The elbow wound dressing did not look like it had been changed in at least a week when it was removed. The underlying wound, however has not reaccumulated any hypertrophic granulation tissue; there is a thin layer of slough present. No malodor or purulent drainage from the wound itself. Electronic Signature(s) Signed: 07/25/2022 2:14:56 PM By: Fredirick Maudlin  MD FACS Entered By: Fredirick Maudlin on 07/25/2022 14:14:56 -------------------------------------------------------------------------------- Physical Exam Details Patient Name: Date of Service: Brian [name] 07/25/2022 1:15 PM Medical Record Number: [id number] Patient Account Number: [id number] Date of Birth/Sex: Treating RN: [birth year] (88 y.o. Brian [name], Brian [name] ([id number]) [id number] Page 4 of 10 Primary Care Provider: Abelino Derrick Other Clinician: Referring Provider: Treating Provider/Extender: Sandi Raveling in Treatment: 23 Constitutional . . . . No acute distress. Respiratory Normal work of breathing on room air. Notes 07/25/2022: The buttock wounds have reopened. The periwound skin has been kept very dry through liberal use of zinc oxide, however. The elbow wound dressing did not look like it had been changed in at least a week when it was removed. The underlying wound, however has not reaccumulated any hypertrophic granulation tissue; there is a thin layer of slough present. No malodor or purulent drainage from the wound itself. Electronic Signature(s) Signed: 07/25/2022 2:16:24 PM By: Fredirick Maudlin MD FACS Entered By: Fredirick Maudlin on 07/25/2022 14:16:24 -------------------------------------------------------------------------------- Physician Orders Details Patient Name: Date of Service: Brian [name] 07/25/2022 1:15 PM Medical Record Number: [id number] Patient Account Number: [id number] Date of Birth/Sex: Treating RN: Sep 23, 1932 (88 y.o. Brian [name] Primary Care Provider: Abelino Derrick Other Clinician: Referring Provider: Treating Provider/Extender: Sandi Raveling in Treatment: 23 Verbal / Phone Orders: No Diagnosis Coding ICD-10 Coding Code Description L89.023 Pressure ulcer of left elbow, stage 3 I63.00 Cerebral infarction due to thrombosis of unspecified  precerebral artery I48.19 Other persistent atrial fibrillation I25.10 Atherosclerotic heart disease of native coronary artery without angina pectoris L89.323 Pressure ulcer of left buttock, stage 3 Follow-up Appointments Return appointment in 3 weeks. - Dr. Celine Ahr Room 3 Bathing/ Shower/ Hygiene May shower and wash wound with soap and water. - with dressing changes Off-Loading Turn and reposition every 2 hours - stay  off of left elbow wear prevalon boot on elbow as directed Other: - limit time in wheelchair, reposition in wheelchair every 1-2 hours [facility] for skilled nursing wound care. May utilize formulary equivalent dressing for wound treatment orders unless otherwise specified. - Skilled nursing to provide wound care Dressing changes to be completed by [facility] on Monday / Wednesday / Friday except when patient has scheduled visit at [facility]. Other Home Health Orders/Instructions: - [facility] Wound Treatment Wound #1 - Elbow Wound Laterality: Left Cleanser: Soap and Water Every Other Day/30 Days Discharge Instructions: May shower and wash wound with dial antibacterial soap and water prior to dressing change. Cleanser: Wound Cleanser (DME) (Generic) Every Other Day/30 Days Discharge Instructions: Cleanse the wound with wound cleanser prior to applying a clean dressing using gauze sponges, not tissue or cotton balls. Prim Dressing: Sorbalgon AG Dressing, 4x4 (in/in) (DME) (Generic) Every Other Day/30 Days ary DONG, NIMMONS ([id number]) [id number] Page 5 of 10 Discharge Instructions: Apply to wound bed as instructed Secondary Dressing: ALLEVYN Gentle Border, 5x5 (in/in) (DME) (Generic) Every Other Day/30 Days Discharge Instructions: Apply over primary dressing as directed. Add-Ons: Cotton Tip Applicator, 6 (in) (DME) (Generic) Every Other Day/30 Days Wound #2 - Gluteus Wound Laterality: Left Peri-Wound Care: Zinc  Oxide Ointment 30g tube (Generic) Every Other Day/30 Days Discharge Instructions: Apply Zinc Oxide to apply to around the 3 small wounds on the gluteus Prim Dressing: Sorbalgon AG Dressing, 4x4 (in/in) (DME) (Generic) Every Other Day/30 Days ary Discharge Instructions: Apply to wound bed as instructed Secondary Dressing: ALLEVYN Gentle Border, 3x3 (in/in) (DME) (Generic) Every Other Day/30 Days Discharge Instructions: Apply over primary dressing as directed. Electronic Signature(s) Signed: 07/28/2022 2:30:25 PM By: Fredirick Maudlin MD FACS Signed: 07/28/2022 4:21:08 PM By: Blanche East RN Previous Signature: 07/25/2022 3:31:57 PM Version By: Fredirick Maudlin MD FACS Previous Signature: 07/25/2022 5:09:37 PM Version By: Dellie Catholic RN Previous Signature: 07/25/2022 2:46:51 PM Version By: Fredirick Maudlin MD FACS Entered By: Blanche East on 07/28/2022 13:55:26 -------------------------------------------------------------------------------- Problem List Details Patient Name: Date of Service: Brian [name] 07/25/2022 1:15 PM Medical Record Number: [id number] Patient Account Number: [id number] Date of Birth/Sex: Treating RN: 07/03/1933 (88 y.o. [name]) Primary Care Provider: Abelino Derrick Other Clinician: Referring Provider: Treating Provider/Extender: Sandi Raveling in Treatment: 23 Active Problems ICD-10 Encounter Code Description Active Date MDM Diagnosis L89.023 Pressure ulcer of left elbow, stage 3 02/14/2022 No Yes 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 02/14/2022 No Yes I48.19 Other persistent atrial fibrillation 02/14/2022 No Yes I25.10 Atherosclerotic heart disease of native coronary artery without angina pectoris 02/14/2022 No Yes L89.323 Pressure ulcer of left buttock, stage 3 05/23/2022 No Yes Inactive Problems Resolved Problems Electronic Signature(s) XHAIDEN, COOMBS ([id number])  [id number] Page 6 of 10 Signed: 07/25/2022 2:13:17 PM By: Fredirick Maudlin MD FACS Entered By: Fredirick Maudlin on 07/25/2022 14:13:16 -------------------------------------------------------------------------------- Progress Note Details Patient Name: Date of Service: Brian [name] 07/25/2022 1:15 PM Medical Record Number: [id number] Patient Account Number: [id number] Date of Birth/Sex: Treating RN: 20-Jul-1933 (88 y.o. [name]) Primary Care Provider: Abelino Derrick Other Clinician: Referring Provider: Treating Provider/Extender: Sandi Raveling in Treatment: 23 Subjective Chief Complaint Information obtained from Patient 08/05/2019; patient is here for review of pressure ulcers on his buttock 02/14/22: patient is here for review of pressure ulcers on his left elbow History of Present Illness (HPI) ADMISSION 08/05/19 This is an  88 year old man who arrives in clinic accompanied by his son. 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 Apparently noted recently to have a stage I pressure area on the left buttock. He does not have a wound history. He does have been air fluidized mattress. They have a wheelchair cushion as well as a pillow over the top of this. He is not incontinent of urine or bowel. Past medical history includes hypertension, osteoarthritis of the knee, atrial fibrillation, history of mitral valve repair, right hemisphere CVA with left hemiparesis, history of a DVT READMISSION 02/14/2022 The patient is here today for evaluation of pressure ulcers on his left elbow and possible right buttock ulcer. Apparently when he was seen by Dr. Dellia Nims in 2020, no ulcer was appreciated on the buttock and no further wound care involvement occurred. 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 He does have home health aides and they are turning him every 2 hours side to side. He is on a memory foam mattress at this time. 03/14/2022: The patient was scheduled to see me on July 11, but apparently his son panicked about some bright red blood per rectum and rushed him to the emergency room. There is also a comment in the electronic medical record that the patient's son thought the bone was exposed on his elbow. After waiting a prolonged period of time at the emergency department, they left without being seen. Today, it appears that the dressing on the patient's elbow was never changed since our last visit. The 2 wounds have converged creating a larger wound. According to the intake nurse, there was a foul odor from the wound and dressing. There is extensive slough on the wound surface. The bleeding per rectum turns out to be hemorrhoids. The patient's son expresses that he does not know how to manage this. 04/07/2022: For unclear reasons, the patient has not returned to clinic until they were contacted today and they made an add-on visit. The patient's son was not originally present at the beginning of the visit but showed up about 25 minutes into the visit. The patient was accompanied by a home aide. She reported that when she cleaned the patient up today, there was just a Band-Aid on his wound. She said the surface was fairly mucky but she was able to wash this off. It is abundantly clear that this wound is not being properly cared for. Apparently he did have [facility] home health but they have not been out in some time and it is not clear the reason for this. The surface of the wound is a bit cleaner so perhaps the Santyl has been applied, but unfortunately there is now a deep tunnel that extends up from the elbow to the patient's posterior upper arm for a number centimeters. There is no evidence of infection or purulent drainage. It is also clear that the patient continues to spend a  substantial portion of his time leaning on the elbow. 05/23/2022: Once again, the patient has failed to return to clinic for about 6 weeks and the reason remains unclear. He is apparently receiving home health assistance. The wound on his elbow is covered with hypertrophic granulation tissue. He also has a blister just distal to the wound on his posterior forearm. He has a new stage III pressure ulcer on his left gluteus  with slough accumulation. He apparently spends a substantial portion of his days sitting in a wheelchair. I do not believe he has a Financial trader. Due to his stroke, he lists to the left side, which is how his ulcer on his elbow developed. I suspect the gluteal ulcer is as a result of this, as well. 06/13/2022: The wound on his left gluteus is nearly healed. It is very superficial and quite clean. His son reports that he has just been applying Desitin to the area. The elbow wound is smaller but still has some hypertrophic granulation tissue and slough accumulation. No concern for infection at either site. 06/27/2022: The buttock wounds are healed. There is some tissue maceration without any significant skin breakdown. The elbow wound continues to contract. There is still some hypertrophic granulation tissue and slough buildup. 07/25/2022: The buttock wounds have reopened. The periwound skin has been kept very dry through liberal use of zinc oxide, however. The elbow wound dressing did not look like it had been changed in at least a week when it was removed. The underlying wound, however has not reaccumulated any hypertrophic granulation tissue; there is a thin layer of slough present. No malodor or purulent drainage from the wound itself. Patient History Information obtained from Patient. Family History Diabetes - Maternal Grandparents, Heart Disease - Mother, Hypertension - Mother, No family history of Cancer, Hereditary Spherocytosis, Kidney Disease, Lung Disease, Seizures, Stroke,  Thyroid Problems, Tuberculosis. Social History Never smoker, Marital Status - Married, Alcohol Use - Never, Drug Use - No History, Caffeine Use - Never. Brian [name], Brian [name] ([id number]) [id number] Page 7 of 10 Medical History Eyes Patient has history of Cataracts - removed Cardiovascular Patient has history of Arrhythmia - Atrial Flutter/Atrial Fib, Coronary Artery Disease, Hypotension Denies history of Hypertension Musculoskeletal Patient has history of Osteoarthritis Neurologic Denies history of Paraplegia Hospitalization/Surgery History - open heart surgery. - IR generic histoircal. - appendectomy. - hernia repair. - joint replacement. - mitral valve repair. - mvp repair. - total hip arthroplasty. Medical A Surgical History Notes nd Constitutional Symptoms (General Health) obseity Ear/Nose/Mouth/Throat wears hearing aids Cardiovascular Mitral Valve Repair, Genitourinary BPH Neurologic CVA 2018, left side hemiparesis Objective Constitutional No acute distress. Vitals Time Taken: 1:35 PM, Height: 66 in, Weight: 185 lbs, BMI: 29.9, Temperature: 98.6 F, Pulse: 97 bpm, Respiratory Rate: 18 breaths/min, Blood Pressure: 121/71 mmHg. Respiratory Normal work of breathing on room air. General Notes: 07/25/2022: The buttock wounds have reopened. The periwound skin has been kept very dry through liberal use of zinc oxide, however. The elbow wound dressing did not look like it had been changed in at least a week when it was removed. The underlying wound, however has not reaccumulated any hypertrophic granulation tissue; there is a thin layer of slough present. No malodor or purulent drainage from the wound itself. Integumentary (Hair, Skin) Wound #1 status is Open. Original cause of wound was Pressure Injury. The date acquired was: 12/19/2021. The wound has been in treatment 23 weeks. The wound is located on the Left Elbow. The wound measures 3.5cm length x 3.5cm  width x 0.4cm depth; 9.621cm^2 area and 3.848cm^3 volume. There is Fat Layer (Subcutaneous Tissue) exposed. There is undermining starting at 3:00 and ending at 6:00 with a maximum distance of 0.3cm. There is a medium amount of serosanguineous drainage noted. The wound margin is distinct with the outline attached to the wound base. There is large (67-100%) red, hyper - granulation within the wound bed. There is a small (1-33%) amount  of necrotic tissue within the wound bed including Adherent Slough. The periwound skin appearance had no abnormalities noted for texture. The periwound skin appearance had no abnormalities noted for moisture. The periwound skin appearance had no abnormalities noted for color. Periwound temperature was noted as No Abnormality. The periwound has tenderness on palpation. Wound #2 status is Open. Original cause of wound was Pressure Injury. The date acquired was: 04/21/2022. The wound has been in treatment 9 weeks. The wound is located on the Left Gluteus. The wound measures 4cm length x 0.8cm width x 0.1cm depth; 2.513cm^2 area and 0.251cm^3 volume. There is Fat Layer (Subcutaneous Tissue) exposed. There is no tunneling or undermining noted. There is a medium amount of serosanguineous drainage noted. The wound margin is flat and intact. There is large (67-100%) red granulation within the wound bed. There is a small (1-33%) amount of necrotic tissue within the wound bed including Adherent Slough. The periwound skin appearance had no abnormalities noted for color. The periwound skin appearance exhibited: Excoriation, Rash, Dry/Scaly. The periwound skin appearance did not exhibit: Maceration. Periwound temperature was noted as No Abnormality. The periwound has tenderness on palpation. 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 There was a Selective/Open Wound Non-Viable Tissue Debridement with a total area of 12.25 sq cm performed by Fredirick Maudlin, MD. With the following instrument(s): Curette to remove Non-Viable tissue/material. Material removed includes [facility] after achieving pain control using Lidocaine 4% Topical Solution. No specimens were taken. A time out was conducted at 13:50, prior to the start of the procedure. A Minimum amount of bleeding was controlled with Pressure. The procedure was tolerated well with a pain level of 0 throughout and a pain level of 0 following the procedure. Post Debridement Measurements: 3.5cm length x 3.5cm width x 0.4cm depth; 3.848cm^3 volume. Post debridement Stage noted as Category/Stage III. Character of Wound/Ulcer Post Debridement is improved. Post procedure Diagnosis Wound #1: Same as Pre-Procedure General Notes: Scribed for Dr. Bearl Mulberry by J.S.. Wound #2 Pre-procedure diagnosis of Wound #2 is a Pressure Ulcer located on the Left Gluteus . There was a Selective/Open Wound Non-Viable Tissue Debridement with a total area of 3.2 sq cm performed by Fredirick Maudlin, MD. With the following instrument(s): Curette to remove Non-Viable tissue/material. Material removed includes [facility] after achieving pain control using Lidocaine 4% Topical Solution. No specimens were taken. A time out was conducted at 13:50, prior to the start of the procedure. A Minimum amount of bleeding was controlled with Pressure. The procedure was tolerated well with a pain level of 0 throughout and a pain level of 0 following the procedure. Post Debridement Measurements: 4cm  length x 0.8cm width x 0.1cm depth; 0.251cm^3 volume. Post debridement Stage noted as Category/Stage III. Character of Wound/Ulcer Post Debridement is improved. Post procedure Diagnosis Wound #2: Same as Pre-Procedure General Notes: Scribed for Dr. Celine Ahr by J.Scotton. Plan Follow-up Appointments: Return appointment in 3 weeks. - Dr. Celine Ahr Room 3 Bathing/ Shower/ Hygiene: May shower and wash wound with soap and water. - with dressing changes Off-Loading: Turn and reposition every 2 hours - stay off of left  elbow wear prevalon boot on elbow as directed Other: - limit time in wheelchair, reposition in wheelchair every 1-2 hours WOUND #1: - Elbow Wound Laterality: Left Cleanser: Soap and Water Every Other Day/30 Days Discharge Instructions: May shower and wash wound with dial antibacterial soap and water prior to dressing change. Cleanser: Wound Cleanser Every Other Day/30 Days Discharge Instructions: Cleanse the wound with wound cleanser prior to applying a clean dressing using gauze sponges, not tissue or cotton balls. Prim Dressing: Sorbalgon AG Dressing, 4x4 (in/in) Every Other Day/30 Days ary Discharge Instructions: Apply to wound bed as instructed Secondary Dressing: ALLEVYN Gentle Border, 5x5 (in/in) (Dispense As Written) Every Other Day/30 Days Discharge Instructions: Apply over primary dressing as directed. Add-Ons: Cotton Tip Applicator, 6 (in) (Generic) Every Other Day/30 Days WOUND #2: - Gluteus Wound Laterality: Left Peri-Wound Care: Zinc Oxide Ointment 30g tube Every Other Day/30 Days Discharge Instructions: Apply Zinc Oxide to apply to around the 3 small wounds on the gluteus Prim Dressing: Sorbalgon AG Dressing, 4x4 (in/in) (Generic) Every Other Day/30 Days ary Discharge Instructions: Apply to wound bed as instructed Secondary Dressing: ALLEVYN Gentle Border, 3x3 (in/in) Every Other Day/30 Days Discharge Instructions: Apply over primary dressing as directed. 07/25/2022:  The buttock wounds have reopened. The periwound skin has been kept very dry through liberal use of zinc oxide, however. The elbow wound dressing did not look like it had been changed in at least a week when it was removed. The underlying wound, however has not reaccumulated any hypertrophic granulation tissue; there is a thin layer of slough present. No malodor or purulent drainage from the wound itself. I used a curette to debride slough from both the elbow and buttock wounds. I am going to change his dressing to silver alginate for both sites. Continue heel cup and foam border dressings. Follow-up in 3 weeks. Electronic Signature(s) Signed: 07/25/2022 2:19:17 PM By: Fredirick Maudlin MD FACS Entered By: Fredirick Maudlin on 07/25/2022 14:19:17 -------------------------------------------------------------------------------- HxROS Details Patient Name: Date of Service: Brian [name] 07/25/2022 1:15 PM Medical Record Number: [id number] Patient Account Number: [id number] Date of Birth/Sex: Treating RN: 04/05/1933 (88 y.o. Brian [name], Jagjit [name] ([id number]) [id number] Page 9 of 10 Primary Care Provider: Abelino Derrick Other Clinician: Referring Provider: Treating Provider/Extender: Sandi Raveling in Treatment: 23 Information Obtained From Patient Constitutional Symptoms (General Health) Medical History: Past Medical History Notes: obseity Eyes Medical History: Positive for: Cataracts - removed Ear/Nose/Mouth/Throat Medical History: Past Medical History Notes: wears hearing aids Cardiovascular Medical History: Positive for: Arrhythmia - Atrial Flutter/Atrial Fib; Coronary Artery Disease; Hypotension Negative for: Hypertension Past Medical History Notes: Mitral Valve Repair, Genitourinary Medical History: Past Medical History Notes: BPH Musculoskeletal Medical History: Positive for: Osteoarthritis Neurologic Medical  History: Negative for: Paraplegia Past Medical History Notes: CVA 2018, left side hemiparesis HBO Extended History Items Eyes: Cataracts Immunizations Pneumococcal Vaccine: Received Pneumococcal Vaccination: Yes Received Pneumococcal Vaccination On or After 60th Birthday: Yes Implantable Devices None Hospitalization / Surgery History Type of Hospitalization/Surgery open heart surgery IR generic histoircal appendectomy hernia repair joint replacement mitral valve repair mvp repair total hip arthroplasty Family and Social History Cancer: No; Diabetes: Yes - Maternal Grandparents; Heart Disease: Yes - Mother; Hereditary Spherocytosis: No; Hypertension: Yes - Mother; Kidney Disease: No; Lung Disease: No; Seizures: No; Stroke: No; Thyroid Problems: No; Tuberculosis: No; Never smoker; Marital Status - Married; Alcohol Use: Never; Drug Use: No History; Caffeine Use: Never; Financial Concerns: No; Food, Clothing or Shelter Needs: No; Support  System Lacking: No; Brian [name], Brian [name] ([id number]) [id number] Page 10 of 10 Transportation Concerns: No Electronic Signature(s) Signed: 07/25/2022 2:46:51 PM By: Fredirick Maudlin MD FACS Entered By: Fredirick Maudlin on 07/25/2022 14:15:58 -------------------------------------------------------------------------------- SuperBill Details Patient Name: Date of Service: Brian [name] 07/25/2022 Medical Record Number: [id number] Patient Account Number: [id number] Date of Birth/Sex: Treating RN: Jan 30, 1933 (89 y.o. 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 I48.19 Other persistent atrial fibrillation I25.10 Atherosclerotic heart  disease of native coronary artery without angina pectoris L89.323 Pressure ulcer of left buttock, stage 3 Facility Procedures : CPT4 Code: [id number] Description: 11031 - DEBRIDE WOUND 1ST 20 SQ CM OR < ICD-10 Diagnosis Description L89.023 Pressure ulcer of left elbow, stage 3 L89.323 Pressure ulcer of left buttock, stage 3 Modifier: Quantity: 1 Physician Procedures : CPT4 Code Description Modifier [id number] - WC PHYS LEVEL 3 - EST PT 25 ICD-10 Diagnosis Description L89.023 Pressure ulcer of left elbow, stage 3 L89.323 Pressure ulcer of left buttock, stage 3 I63.00 Cerebral infarction due to thrombosis of  unspecified precerebral artery Quantity: 1 : [id number] - WC PHYS DEBR WO ANESTH 20 SQ CM ICD-10 Diagnosis Description L89.023 Pressure ulcer of left elbow, stage 3 L89.323 Pressure ulcer of left buttock, stage 3 Quantity: 1 Electronic Signature(s) Signed: 07/25/2022 2:19:37 PM By: Fredirick Maudlin MD FACS Entered By: Fredirick Maudlin on 07/25/2022 14:19:36

## 2022-07-27 NOTE — Progress Notes (Addendum)
DOROTHY, LANDGREBE Jerilynn Mages ([id number]) [id number] Page 1 of 8 Visit Report for 07/25/2022 Arrival Information Details Patient Name: Date of Service: [name] ID M. 07/25/2022 1:15 PM Medical Record Number: [id number] Patient Account Number: [id number] Date of Birth/Sex: Treating RN: July 16, 1933 (88 y.o. [name] Primary Care Lakeena Downie: Abelino Derrick Other Clinician: Referring Florrie Ramires: Treating Ertha Nabor/Extender: Sandi Raveling in Treatment: 23 Visit Information History Since Last Visit Added or deleted any medications: No Patient Arrived: Wheel Chair Any new allergies or adverse reactions: No Arrival Time: 13:30 Had a fall or experienced change in No Accompanied By: son activities of daily living that may affect Transfer Assistance: Manual risk of falls: Patient Identification Verified: Yes Signs or symptoms of abuse/neglect since last visito No Patient Requires Transmission-Based Precautions: No Hospitalized since last visit: No Patient Has Alerts: Yes Implantable device outside of the clinic excluding No Patient Alerts: Patient on Blood Thinner cellular tissue based products placed in the center since last visit: Has Dressing in Place as Prescribed: Yes Pain Present Now: Yes Electronic Signature(s) Signed: 07/25/2022 5:09:37 PM By: Dellie Catholic RN Entered By: Dellie Catholic on 07/25/2022 13:32:26 -------------------------------------------------------------------------------- Complex / Palliative Patient Assessment Details Patient Name: Date of Service: [name] ID M. 07/25/2022 1:15 PM Medical Record Number: [id number] Patient Account Number: [id number] Date of Birth/Sex: Treating RN: Jun 18, 1933 (89 y.o. Hessie Diener Primary Care Exzavier Ruderman: Abelino Derrick Other Clinician: Referring Jamel Dunton: Treating Sultana Tierney/Extender: Sandi Raveling in Treatment: 23 Complex Wound Management  Criteria Patient has remarkable or complex co-morbidities requiring medications or treatments that extend wound healing times. Examples: Diabetes mellitus with chronic renal failure or end stage renal disease requiring dialysis Advanced or poorly controlled rheumatoid arthritis Diabetes mellitus and end stage chronic obstructive pulmonary disease Active cancer with current chemo- or radiation therapy A.fib/A.flutter/CABG, CVA left sided paraplegia, CAD, hypotension, BPH, OA, mitral valve repair. Palliative Wound Management Criteria Care Approach Wound Care Plan: Complex Wound Management Electronic Signature(s) Signed: 07/31/2022 4:58:54 PM By: Deon Pilling RN, BSN Signed: 08/28/2022 2:25:03 PM By: Fredirick Maudlin MD FACS Entered By: Deon Pilling on 07/31/2022 10:24:59 Leamer, Nyeem M ([id number]) [id number] Page 2 of 8 -------------------------------------------------------------------------------- Encounter Discharge Information Details Patient Name: Date of Service: [name] ID M. 07/25/2022 1:15 PM Medical Record Number: [id number] Patient Account Number: [id number] Date of Birth/Sex: Treating RN: [birth year] (88 y.o. [name] Primary Care Ahsan Esterline: Abelino Derrick Other Clinician: Referring Kailiana Granquist: Treating Antolin Belsito/Extender: Sandi Raveling in Treatment: 23 Encounter Discharge Information Items Post Procedure Vitals Discharge Condition: Stable Temperature (F): 98.6 Ambulatory Status: Wheelchair Pulse (bpm): 97 Discharge Destination: Home Respiratory Rate (breaths/min): 18 Transportation: Private Auto Blood Pressure (mmHg): 121/71 Accompanied By: son Schedule Follow-up Appointment: Yes Clinical Summary of Care: Electronic Signature(s) Signed: 07/26/2022 4:13:08 PM By: Blanche East RN Entered By: Blanche East on 07/25/2022  14:34:03 -------------------------------------------------------------------------------- Lower Extremity Assessment Details Patient Name: Date of Service: [name] ID M. 07/25/2022 1:15 PM Medical Record Number: [id number] Patient Account Number: [id number] Date of Birth/Sex: Treating RN: [birth year] (88 y.o. [name] Primary Care Kalianna Verbeke: Abelino Derrick Other Clinician: Referring Lynnea Vandervoort: Treating Janece Laidlaw/Extender: Sandi Raveling in Treatment: 23 Electronic Signature(s) Signed: 07/25/2022 5:09:37 PM By: Dellie Catholic RN Entered By: Dellie Catholic on 07/25/2022 13:37:14 -------------------------------------------------------------------------------- Multi Wound Chart Details Patient Name: Date of Service: [name] ID M. 07/25/2022 1:15 PM Medical Record Number: [id number] Patient Account Number: [id number] Date  of Birth/Sex: Treating RN: July 21, 1933 (88 y.o. M) Primary Care Reeda Soohoo: Abelino Derrick Other Clinician: Referring Brannon Levene: Treating Rolando Whitby/Extender: Sandi Raveling in Treatment: 23 Vital Signs Height(in): 66 Pulse(bpm): 97 Weight(lbs): 185 Blood Pressure(mmHg): 121/71 Body Mass Index(BMI): 29.9 Temperature(F): 98.6 Respiratory Rate(breaths/min): [address] Arihant M ([id number]) [phone].pdf Page 3 of 8 [1:Photos:] [N/A:N/A] Left Elbow Left Gluteus N/A Wound Location: Pressure Injury Pressure Injury N/A Wounding Event: Pressure Ulcer Pressure Ulcer N/A Primary Etiology: Cataracts, Arrhythmia, Coronary Cataracts, Arrhythmia, Coronary N/A Comorbid History: Artery Disease, Hypotension, Artery Disease, Hypotension, Osteoarthritis Osteoarthritis 12/19/2021 04/21/2022 N/A Date Acquired: 23 9 N/A Weeks of Treatment: Open Open N/A Wound Status: No No N/A Wound Recurrence: Yes No N/A Clustered Wound: 3.5x3.5x0.4 4x0.8x0.1 N/A Measurements L x W x D (cm) 9.621 2.513  N/A A (cm) : rea 3.848 0.251 N/A Volume (cm) : 0.00% -153.80% N/A % Reduction in A rea: -300.00% -153.50% N/A % Reduction in Volume: 3 Starting Position 1 (o'clock): 6 Ending Position 1 (o'clock): 0.3 Maximum Distance 1 (cm): Yes No N/A Undermining: Category/Stage III Category/Stage III N/A Classification: Medium Medium N/A Exudate A mount: Serosanguineous Serosanguineous N/A Exudate Type: red, brown red, brown N/A Exudate Color: Distinct, outline attached Flat and Intact N/A Wound Margin: Large (67-100%) Large (67-100%) N/A Granulation A mount: Red, Hyper-granulation Red N/A Granulation Quality: Small (1-33%) Small (1-33%) N/A Necrotic A mount: Fat Layer (Subcutaneous Tissue): Yes Fat Layer (Subcutaneous Tissue): Yes N/A Exposed Structures: Fascia: No Fascia: No Tendon: No Tendon: No Muscle: No Muscle: No Joint: No Joint: No Bone: No Bone: No Small (1-33%) Small (1-33%) N/A Epithelialization: Debridement - Selective/Open Wound Debridement - Selective/Open Wound N/A Debridement: Pre-procedure Verification/Time Out 13:50 13:50 N/A Taken: Lidocaine 4% Topical Solution Lidocaine 4% Topical Solution N/A Pain Control: [employer] N/A Tissue Debrided: Non-Viable Tissue Non-Viable Tissue N/A Level: 12.25 3.2 N/A Debridement A (sq cm): rea Curette Curette N/A Instrument: Minimum Minimum N/A Bleeding: Pressure Pressure N/A Hemostasis A chieved: 0 0 N/A Procedural Pain: 0 0 N/A Post Procedural Pain: Procedure was tolerated well Procedure was tolerated well N/A Debridement Treatment Response: 3.5x3.5x0.4 4x0.8x0.1 N/A Post Debridement Measurements L x W x D (cm) 3.848 0.251 N/A Post Debridement Volume: (cm) Category/Stage III Category/Stage III N/A Post Debridement Stage: No Abnormalities Noted Excoriation: Yes N/A Periwound Skin Texture: Rash: Yes No Abnormalities Noted Dry/Scaly: Yes N/A Periwound Skin Moisture: Maceration: No No  Abnormalities Noted No Abnormalities Noted N/A Periwound Skin Color: No Abnormality No Abnormality N/A Temperature: Yes Yes N/A Tenderness on Palpation: N/A cluster of three wounds N/A Assessment Notes: Debridement Debridement N/A Procedures Performed: Treatment Notes Electronic Signature(s) Signed: 07/25/2022 2:13:25 PM By: Fredirick Maudlin MD FACS Entered By: Fredirick Maudlin on 07/25/2022 14:13:25 Winbush, Koehn M ([id number]) [id number] Page 4 of 8 -------------------------------------------------------------------------------- Multi-Disciplinary Care Plan Details Patient Name: Date of Service: [name] ID M. 07/25/2022 1:15 PM Medical Record Number: [id number] Patient Account Number: [id number] Date of Birth/Sex: Treating RN: 09-24-1932 (88 y.o. [name] Primary Care Paulette Rockford: Abelino Derrick Other Clinician: Referring Ariana Juul: Treating Jenella Craigie/Extender: Sandi Raveling in Treatment: 23 Multidisciplinary Care Plan reviewed with physician Active Inactive Abuse / Safety / Falls / Self Care Management Nursing Diagnoses: Impaired physical mobility Potential for falls Goals: Patient/caregiver will verbalize understanding of skin care regimen Date Initiated: 02/14/2022 Target Resolution Date: 08/20/2022 Goal Status: Active Patient/caregiver will verbalize/demonstrate measures taken to prevent injury and/or falls Date Initiated: 02/14/2022 Target Resolution Date: 08/20/2022 Goal Status: Active Interventions: Assess fall risk on admission  and as needed Assess self care needs on admission and as needed Notes: Wound/Skin Impairment Nursing Diagnoses: Impaired tissue integrity Knowledge deficit related to ulceration/compromised skin integrity Goals: Patient/caregiver will verbalize understanding of skin care regimen Date Initiated: 02/14/2022 Target Resolution Date: 08/20/2022 Goal Status: Active Ulcer/skin  breakdown will have a volume reduction of 30% by week 4 Date Initiated: 02/14/2022 Date Inactivated: 05/23/2022 Target Resolution Date: 05/05/2022 Goal Status: Unmet Unmet Reason: new PU left glut Interventions: Assess patient/caregiver ability to obtain necessary supplies Assess patient/caregiver ability to perform ulcer/skin care regimen upon admission and as needed Assess ulceration(s) every visit Treatment Activities: Skin care regimen initiated : 02/14/2022 Topical wound management initiated : 02/14/2022 Notes: Electronic Signature(s) Signed: 07/26/2022 4:13:08 PM By: Blanche East RN Entered By: Blanche East on 07/25/2022 14:32:21 Sladek, Cyrus M ([id number]) [id number] Page 5 of 8 -------------------------------------------------------------------------------- Pain Assessment Details Patient Name: Date of Service: [name] ID M. 07/25/2022 1:15 PM Medical Record Number: [id number] Patient Account Number: [id number] Date of Birth/Sex: Treating RN: [birth year] (88 y.o. [name] Primary Care Yazmyn Valbuena: Abelino Derrick Other Clinician: Referring Kristel Durkee: Treating Tabby Beaston/Extender: Sandi Raveling in Treatment: 23 Active Problems Location of Pain Severity and Description of Pain Patient Has Paino Yes Site Locations Rate the pain. Current Pain Level: 4 Character of Pain Describe the Pain: Aching Pain Management and Medication Current Pain Management: Electronic Signature(s) Signed: 07/25/2022 5:09:37 PM By: Dellie Catholic RN Entered By: Dellie Catholic on 07/25/2022 13:37:02 -------------------------------------------------------------------------------- Patient/Caregiver Education Details Patient Name: Date of Service: [name] ID M. 12/5/2023andnbsp1:15 PM Medical Record Number: [id number] Patient Account Number: [id number] Date of Birth/Gender: Treating RN: 1933/02/03 (89 y.o. [name] Primary  Care Physician: Abelino Derrick Other Clinician: Referring Physician: Treating Physician/Extender: Sandi Raveling in Treatment: 23 Education Assessment Education Provided To: Patient Education Topics Provided Wound Debridement: ORELL, HURTADO ([id number]) [id number] Page 6 of 8 Methods: Explain/Verbal Responses: Reinforcements needed, State content correctly Wound/Skin Impairment: Methods: Explain/Verbal Responses: Reinforcements needed, State content correctly Electronic Signature(s) Signed: 07/26/2022 4:13:08 PM By: Blanche East RN Entered By: Blanche East on 07/25/2022 14:32:41 -------------------------------------------------------------------------------- Wound Assessment Details Patient Name: Date of Service: [name] ID M. 07/25/2022 1:15 PM Medical Record Number: [id number] Patient Account Number: [id number] Date of Birth/Sex: Treating RN: 1933-07-21 (88 y.o. [name] Primary Care Estephan Gallardo: Abelino Derrick Other Clinician: Referring Vincenta Steffey: Treating Jaleah Lefevre/Extender: Sandi Raveling in Treatment: 23 Wound Status Wound Number: 1 Primary Pressure Ulcer Etiology: Wound Location: Left Elbow Wound Status: Open Wounding Event: Pressure Injury Comorbid Cataracts, Arrhythmia, Coronary Artery Disease, Date Acquired: 12/19/2021 History: Hypotension, Osteoarthritis Weeks Of Treatment: 23 Clustered Wound: Yes Photos Wound Measurements Length: (cm) 3.5 Width: (cm) 3.5 Depth: (cm) 0.4 Area: (cm) 9.621 Volume: (cm) 3.848 % Reduction in Area: 0% % Reduction in Volume: -300% Epithelialization: Small (1-33%) Undermining: Yes Starting Position (o'clock): 3 Ending Position (o'clock): 6 Maximum Distance: (cm) 0.3 Wound Description Classification: Category/Stage III Wound Margin: Distinct, outline attached Exudate Amount: Medium Exudate Type: Serosanguineous Exudate Color: red,  brown Foul Odor After Cleansing: No Slough/Fibrino Yes Wound Bed Granulation Amount: Large (67-100%) Exposed Structure Granulation Quality: Red, Hyper-granulation Fascia Exposed: No Necrotic Amount: Small (1-33%) Fat Layer (Subcutaneous Tissue) Exposed: Yes Necrotic Quality: Adherent Slough Tendon Exposed: No [name] ([id number]) [id number] Page 7 of 8 Muscle Exposed: No Joint Exposed: No Bone Exposed: No Periwound Skin Texture Texture Color No Abnormalities Noted: Yes No Abnormalities Noted: Yes Moisture  Temperature / Pain No Abnormalities Noted: Yes Temperature: No Abnormality Tenderness on Palpation: Yes Electronic Signature(s) Signed: 07/25/2022 5:09:37 PM By: Dellie Catholic RN Entered By: Dellie Catholic on 07/25/2022 13:38:28 -------------------------------------------------------------------------------- Wound Assessment Details Patient Name: Date of Service: [name] ID M. 07/25/2022 1:15 PM Medical Record Number: [id number] Patient Account Number: [id number] Date of Birth/Sex: Treating RN: 1933/01/02 (88 y.o. [name] Primary Care Sherril Shipman: Abelino Derrick Other Clinician: Referring Rylend Pietrzak: Treating Pecolia Marando/Extender: Sandi Raveling in Treatment: 23 Wound Status Wound Number: 2 Primary Pressure Ulcer Etiology: Wound Location: Left Gluteus Wound Status: Open Wounding Event: Pressure Injury Comorbid Cataracts, Arrhythmia, Coronary Artery Disease, Date Acquired: 04/21/2022 History: Hypotension, Osteoarthritis Weeks Of Treatment: 9 Clustered Wound: No Photos Wound Measurements Length: (cm) 4 Width: (cm) 0.8 Depth: (cm) 0.1 Area: (cm) 2.513 Volume: (cm) 0.251 % Reduction in Area: -153.8% % Reduction in Volume: -153.5% Epithelialization: Small (1-33%) Tunneling: No Undermining: No Wound Description Classification: Category/Stage III Wound Margin: Flat and Intact Exudate Amount:  Medium Exudate Type: Serosanguineous Exudate Color: red, brown Foul Odor After Cleansing: No Slough/Fibrino Yes Wound Bed Granulation Amount: Large (67-100%) Exposed Structure Granulation Quality: Red Fascia Exposed: No Necrotic Amount: Small (1-33%) Fat Layer (Subcutaneous Tissue) Exposed: Yes Necrotic Quality: Adherent Slough Tendon Exposed: No [name] ([id number]) [id number] Page 8 of 8 Muscle Exposed: No Joint Exposed: No Bone Exposed: No Periwound Skin Texture Texture Color No Abnormalities Noted: No No Abnormalities Noted: Yes Excoriation: Yes Temperature / Pain Rash: Yes Temperature: No Abnormality Tenderness on Palpation: Yes Moisture No Abnormalities Noted: No Dry / Scaly: Yes Maceration: No Assessment Notes cluster of three wounds Electronic Signature(s) Signed: 07/25/2022 5:09:37 PM By: Dellie Catholic RN Entered By: Dellie Catholic on 07/25/2022 13:50:13 -------------------------------------------------------------------------------- Vitals Details Patient Name: Date of Service: [name] ID M. 07/25/2022 1:15 PM Medical Record Number: [id number] Patient Account Number: [id number] Date of Birth/Sex: Treating RN: 1933/04/27 (88 y.o. [name] Primary Care Coye Dawood: Abelino Derrick Other Clinician: Referring Latangela Mccomas: Treating Linh Hedberg/Extender: Sandi Raveling in Treatment: 23 Vital Signs Time Taken: 13:35 Temperature (F): 98.6 Height (in): 66 Pulse (bpm): 97 Weight (lbs): 185 Respiratory Rate (breaths/min): 18 Body Mass Index (BMI): 29.9 Blood Pressure (mmHg): 121/71 Reference Range: 80 - 120 mg / dl Electronic Signature(s) Signed: 07/25/2022 5:09:37 PM By: Dellie Catholic RN Entered By: Dellie Catholic on 07/25/2022 13:36:52

## 2022-07-31 DIAGNOSIS — L89023 Pressure ulcer of left elbow, stage 3: Secondary | ICD-10-CM | POA: Diagnosis not present

## 2022-07-31 DIAGNOSIS — I69354 Hemiplegia and hemiparesis following cerebral infarction affecting left non-dominant side: Secondary | ICD-10-CM | POA: Diagnosis not present

## 2022-07-31 DIAGNOSIS — L89322 Pressure ulcer of left buttock, stage 2: Secondary | ICD-10-CM | POA: Diagnosis not present

## 2022-07-31 DIAGNOSIS — I2510 Atherosclerotic heart disease of native coronary artery without angina pectoris: Secondary | ICD-10-CM | POA: Diagnosis not present

## 2022-07-31 DIAGNOSIS — I4819 Other persistent atrial fibrillation: Secondary | ICD-10-CM | POA: Diagnosis not present

## 2022-07-31 DIAGNOSIS — Z7901 Long term (current) use of anticoagulants: Secondary | ICD-10-CM | POA: Diagnosis not present

## 2022-08-04 ENCOUNTER — Ambulatory Visit (HOSPITAL_BASED_OUTPATIENT_CLINIC_OR_DEPARTMENT_OTHER): Payer: Medicare Other | Admitting: General Surgery

## 2022-08-04 DIAGNOSIS — I69354 Hemiplegia and hemiparesis following cerebral infarction affecting left non-dominant side: Secondary | ICD-10-CM | POA: Diagnosis not present

## 2022-08-04 DIAGNOSIS — Z7901 Long term (current) use of anticoagulants: Secondary | ICD-10-CM | POA: Diagnosis not present

## 2022-08-04 DIAGNOSIS — I2510 Atherosclerotic heart disease of native coronary artery without angina pectoris: Secondary | ICD-10-CM | POA: Diagnosis not present

## 2022-08-04 DIAGNOSIS — L89023 Pressure ulcer of left elbow, stage 3: Secondary | ICD-10-CM | POA: Diagnosis not present

## 2022-08-04 DIAGNOSIS — I4819 Other persistent atrial fibrillation: Secondary | ICD-10-CM | POA: Diagnosis not present

## 2022-08-04 DIAGNOSIS — L89322 Pressure ulcer of left buttock, stage 2: Secondary | ICD-10-CM | POA: Diagnosis not present

## 2022-08-07 DIAGNOSIS — L89322 Pressure ulcer of left buttock, stage 2: Secondary | ICD-10-CM | POA: Diagnosis not present

## 2022-08-07 DIAGNOSIS — Z7901 Long term (current) use of anticoagulants: Secondary | ICD-10-CM | POA: Diagnosis not present

## 2022-08-07 DIAGNOSIS — I69354 Hemiplegia and hemiparesis following cerebral infarction affecting left non-dominant side: Secondary | ICD-10-CM | POA: Diagnosis not present

## 2022-08-07 DIAGNOSIS — L89023 Pressure ulcer of left elbow, stage 3: Secondary | ICD-10-CM | POA: Diagnosis not present

## 2022-08-07 DIAGNOSIS — I2510 Atherosclerotic heart disease of native coronary artery without angina pectoris: Secondary | ICD-10-CM | POA: Diagnosis not present

## 2022-08-07 DIAGNOSIS — I4819 Other persistent atrial fibrillation: Secondary | ICD-10-CM | POA: Diagnosis not present

## 2022-08-11 DIAGNOSIS — L89023 Pressure ulcer of left elbow, stage 3: Secondary | ICD-10-CM | POA: Diagnosis not present

## 2022-08-11 DIAGNOSIS — I69354 Hemiplegia and hemiparesis following cerebral infarction affecting left non-dominant side: Secondary | ICD-10-CM | POA: Diagnosis not present

## 2022-08-11 DIAGNOSIS — Z7901 Long term (current) use of anticoagulants: Secondary | ICD-10-CM | POA: Diagnosis not present

## 2022-08-11 DIAGNOSIS — L89322 Pressure ulcer of left buttock, stage 2: Secondary | ICD-10-CM | POA: Diagnosis not present

## 2022-08-11 DIAGNOSIS — I4819 Other persistent atrial fibrillation: Secondary | ICD-10-CM | POA: Diagnosis not present

## 2022-08-11 DIAGNOSIS — I2510 Atherosclerotic heart disease of native coronary artery without angina pectoris: Secondary | ICD-10-CM | POA: Diagnosis not present

## 2022-08-15 ENCOUNTER — Encounter (HOSPITAL_BASED_OUTPATIENT_CLINIC_OR_DEPARTMENT_OTHER): Payer: Medicare Other | Admitting: General Surgery

## 2022-08-15 DIAGNOSIS — I2510 Atherosclerotic heart disease of native coronary artery without angina pectoris: Secondary | ICD-10-CM | POA: Diagnosis not present

## 2022-08-15 DIAGNOSIS — L89023 Pressure ulcer of left elbow, stage 3: Secondary | ICD-10-CM | POA: Diagnosis not present

## 2022-08-15 DIAGNOSIS — L89323 Pressure ulcer of left buttock, stage 3: Secondary | ICD-10-CM | POA: Diagnosis not present

## 2022-08-15 DIAGNOSIS — I4819 Other persistent atrial fibrillation: Secondary | ICD-10-CM | POA: Diagnosis not present

## 2022-08-15 DIAGNOSIS — I6300 Cerebral infarction due to thrombosis of unspecified precerebral artery: Secondary | ICD-10-CM | POA: Diagnosis not present

## 2022-08-15 NOTE — Progress Notes (Addendum)
Brian [name], Brian [name] ([id number]) [id number] Page 1 of 8 Visit Report for 08/15/2022 Chief Complaint Document Details Patient Name: Date of Service: Brian [name] 08/15/2022 2:45 PM Medical Record Number: [id number] Patient Account Number: [id number] Date of Birth/Sex: Treating RN: 1932-08-24 (88 y.o. [name]) Primary Care Provider: Abelino [name] Other Clinician: Referring Provider: Treating Provider/Extender: Brian [name] in Treatment: 26 Information Obtained from: Patient Chief Complaint 08/05/2019; patient is here for review of pressure ulcers on his buttock 02/14/22: patient is here for review of pressure ulcers on his left elbow Electronic Signature(s) Signed: 08/15/2022 3:10:49 PM By: Brian Maudlin MD FACS Entered By: Brian [name] on 08/15/2022 15:10:49 -------------------------------------------------------------------------------- HPI Details Patient Name: Date of Service: Brian [name] 08/15/2022 2:45 PM Medical Record Number: [id number] Patient Account Number: [id number] Date of Birth/Sex: Treating RN: March 06, 1933 (88 y.o. [name]) Primary Care Provider: Abelino [name] Other Clinician: Referring Provider: Treating Provider/Extender: Brian [name] in Treatment: 26 History of Present Illness HPI Description: ADMISSION 08/05/19 This is an 89 year old man who arrives in clinic accompanied by his son. 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 Apparently noted recently to have a stage I pressure area on the left buttock. He does not have a wound history. He does have been air fluidized mattress. They have a wheelchair cushion as well as a pillow over the top of this. He is not incontinent of urine or bowel. Past medical history includes hypertension, osteoarthritis of the knee, atrial fibrillation, history of mitral valve repair, right hemisphere CVA with  left hemiparesis, history of a DVT READMISSION 02/14/2022 The patient is here today for evaluation of pressure ulcers on his left elbow and possible right buttock ulcer. Apparently when he was seen by Dr. Dellia [name] in 2020, no ulcer was appreciated on the buttock and no further wound care involvement occurred. 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 He does have home health aides and they are turning him every 2 hours side to side. He is on a memory foam mattress at this time. 03/14/2022: The patient was scheduled to see me on July 11, but apparently his son panicked about some bright red blood per rectum and rushed him to the emergency room. There is also a comment in the electronic medical record that the patient's son thought the bone was exposed on his elbow. After waiting a prolonged period of time at the emergency department, they left without being seen. Today, it appears that the dressing on the patient's elbow was never changed since our last visit. The 2 wounds have converged creating a larger wound. According to the intake nurse, there was a foul odor from the wound and dressing. There is extensive slough on the wound surface. The bleeding per rectum turns out to be hemorrhoids. The patient's son expresses that he does not know how to manage this. 04/07/2022: For unclear reasons, the patient has not returned to clinic until they were contacted today and they made an add-on visit. The patient's son was not originally present at the beginning of the visit but showed up about 25 minutes into the visit. The patient was accompanied by a home aide. She reported that when she cleaned the patient up today, there was just a Band-Aid on his wound. She said the surface was  fairly mucky but she was able to wash this off. It is abundantly clear that this wound is not being properly  cared for. Apparently he did have [facility] home health but they have not been out in some time and it is not clear the reason for this. The surface of the wound is a bit cleaner so perhaps the Santyl has been applied, but unfortunately there is now a deep tunnel Brian [name], Brian [name] ([id number]) [id number] Page 2 of 8 that extends up from the elbow to the patient's posterior upper arm for a number centimeters. There is no evidence of infection or purulent drainage. It is also clear that the patient continues to spend a substantial portion of his time leaning on the elbow. 05/23/2022: Once again, the patient has failed to return to clinic for about 6 weeks and the reason remains unclear. He is apparently receiving home health assistance. The wound on his elbow is covered with hypertrophic granulation tissue. He also has a blister just distal to the wound on his posterior forearm. He has a new stage III pressure ulcer on his left gluteus with slough accumulation. He apparently spends a substantial portion of his days sitting in a wheelchair. I do not believe he has a Financial trader. Due to his stroke, he lists to the left side, which is how his ulcer on his elbow developed. I suspect the gluteal ulcer is as a result of this, as well. 06/13/2022: The wound on his left gluteus is nearly healed. It is very superficial and quite clean. His son reports that he has just been applying Desitin to the area. The elbow wound is smaller but still has some hypertrophic granulation tissue and slough accumulation. No concern for infection at either site. 06/27/2022: The buttock wounds are healed. There is some tissue maceration without any significant skin breakdown. The elbow wound continues to contract. There is still some hypertrophic granulation tissue and slough buildup. 07/25/2022: The buttock wounds have reopened. The periwound skin has been kept very dry through liberal use of zinc oxide,  however. The elbow wound dressing did not look like it had been changed in at least a week when it was removed. The underlying wound, however has not reaccumulated any hypertrophic granulation tissue; there is a thin layer of slough present. No malodor or purulent drainage from the wound itself. 08/15/2022: There is a tiny opening remaining on the buttocks. It is clean and there is no evidence of tissue maceration. The elbow looks very good. There is good beefy tissue present and it is flush with the surrounding skin. Home health has been coming out. Electronic Signature(s) Signed: 08/15/2022 3:11:42 PM By: Brian Maudlin MD FACS Entered By: Brian [name] on 08/15/2022 15:11:42 -------------------------------------------------------------------------------- Physical Exam Details Patient Name: Date of Service: Brian [name] 08/15/2022 2:45 PM Medical Record Number: [id number] Patient Account Number: [id number] Date of Birth/Sex: Treating RN: Nov 08, 1932 (89 y.o. [name]) Primary Care Provider: Abelino [name] Other Clinician: Referring Provider: Treating Provider/Extender: Brian [name] in Treatment: 26 Constitutional No acute distress. Respiratory Normal work of breathing on room air. Notes 08/15/2022: There is a tiny opening remaining on the buttocks. It is clean and there is no evidence of tissue maceration. The elbow looks very good. There is good beefy tissue present and it is flush with the surrounding skin. Electronic Signature(s) Signed: 08/15/2022 3:12:12 PM By: Brian Maudlin MD FACS Entered By: Brian [name] on 08/15/2022 15:12:12 -------------------------------------------------------------------------------- Physician Orders Details Patient Name: Date  of Service: Brian [name] 08/15/2022 2:45 PM Medical Record Number: [id number] Patient Account Number: [id number] Date of Birth/Sex: Treating RN: 17-Mar-1933 (88 y.o. [name] Primary Care Provider: Abelino [name] Other Clinician: Referring Provider: Treating Provider/Extender: Brian [name] in Treatment: 26 Verbal / Phone Orders: No Diagnosis [address] ACE, [place] ([id number]) [id number] Page 3 of 8 ICD-10 Coding Code Description L89.023 Pressure ulcer of left elbow, stage 3 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 I48.19 Other persistent atrial fibrillation I25.10 Atherosclerotic heart disease of native coronary artery without angina pectoris L89.323 Pressure ulcer of left buttock, stage 3 Follow-up Appointments Return appointment in 3 weeks. - Dr. Celine Ahr Room 3 Bathing/ Shower/ Hygiene May shower and wash wound with soap and water. - with dressing changes Off-Loading Turn and reposition every 2 hours - stay off of left elbow wear prevalon boot on elbow as directed Other: - limit time in wheelchair, reposition in wheelchair every 1-2 hours Home Health No change in wound care orders this week; continue Home Health for wound care. May utilize formulary equivalent dressing for wound treatment orders unless otherwise specified. Dressing changes to be completed by [facility] on Monday / Wednesday / Friday except when patient has scheduled visit at [facility]. Other Home Health Orders/Instructions: - [facility] Wound Treatment Wound #1 - Elbow Wound Laterality: Left Cleanser: Soap and Water Every Other Day/30 Days Discharge Instructions: May shower and wash wound with dial antibacterial soap and water prior to dressing change. Cleanser: Wound Cleanser (Generic) Every Other Day/30 Days Discharge Instructions: Cleanse the wound with wound cleanser prior to applying a clean dressing using gauze sponges, not tissue or cotton balls. Prim Dressing: Sorbalgon AG Dressing, 4x4 (in/in) (Generic) Every Other Day/30 Days ary Discharge Instructions: Apply to wound  bed as instructed Secondary Dressing: ALLEVYN Gentle Border, 5x5 (in/in) (Generic) Every Other Day/30 Days Discharge Instructions: Apply over primary dressing as directed. Add-Ons: Cotton Tip Applicator, 6 (in) (Generic) Every Other Day/30 Days Electronic Signature(s) Signed: 08/16/2022 8:36:05 AM By: Brian Maudlin MD FACS Signed: 08/18/2022 12:15:24 PM By: Blanche East RN Previous Signature: 08/15/2022 3:18:57 PM Version By: Blanche East RN Previous Signature: 08/15/2022 3:22:41 PM Version By: Brian Maudlin MD FACS Entered By: Blanche East on 08/15/2022 15:52:02 -------------------------------------------------------------------------------- Problem List Details Patient Name: Date of Service: Brian [name] 08/15/2022 2:45 PM Medical Record Number: [id number] Patient Account Number: [id number] Date of Birth/Sex: Treating RN: [birth year] (89 y.o. [name]) Primary Care Provider: Abelino [name] Other Clinician: Referring Provider: Treating Provider/Extender: Brian [name] in Treatment: 26 Active Problems ICD-10 Encounter Code Description Active Date MDM Diagnosis L89.023 Pressure ulcer of left elbow, stage 3 02/14/2022 No Yes KEAGAN, BRISLIN ([id number]) [id number] Page 4 of 8 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 02/14/2022 No Yes I48.19 Other persistent atrial fibrillation 02/14/2022 No Yes I25.10 Atherosclerotic heart disease of native coronary artery without angina pectoris 02/14/2022 No Yes L89.323 Pressure ulcer of left buttock, stage 3 05/23/2022 No Yes Inactive Problems Resolved Problems Electronic Signature(s) Signed: 08/15/2022 3:10:34 PM By: Brian Maudlin MD FACS Entered By: Brian [name] on 08/15/2022 15:10:34 -------------------------------------------------------------------------------- Progress Note Details Patient Name: Date of Service: Brian [name]  08/15/2022 2:45 PM Medical Record Number: [id number] Patient Account Number: [id number] Date of Birth/Sex: Treating RN: December 18, 1932 (88 y.o. [name]) Primary Care Provider: Abelino [name] Other Clinician: Referring Provider: Treating Provider/Extender: Randye Lobo  Weeks in Treatment: 26 Subjective Chief Complaint Information obtained from Patient 08/05/2019; patient is here for review of pressure ulcers on his buttock 02/14/22: patient is here for review of pressure ulcers on his left elbow History of Present Illness (HPI) ADMISSION 08/05/19 This is an 88 year old man who arrives in clinic accompanied by his son. 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 Apparently noted recently to have a stage I pressure area on the left buttock. He does not have a wound history. He does have been air fluidized mattress. They have a wheelchair cushion as well as a pillow over the top of this. He is not incontinent of urine or bowel. Past medical history includes hypertension, osteoarthritis of the knee, atrial fibrillation, history of mitral valve repair, right hemisphere CVA with left hemiparesis, history of a DVT READMISSION 02/14/2022 The patient is here today for evaluation of pressure ulcers on his left elbow and possible right buttock ulcer. Apparently when he was seen by Dr. Dellia [name] in 2020, no ulcer was appreciated on the buttock and no further wound care involvement occurred. 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 He does have home health aides and they are turning him every 2 hours side to side. He is on a memory foam mattress at this time. 03/14/2022: The patient was scheduled to see me on July 11, but apparently his son panicked about some bright red blood per rectum and rushed him to the emergency room. There is also  a comment in the electronic medical record that the patient's son thought the bone was exposed on his elbow. After waiting a prolonged period of time at the emergency department, they left without being seen. Today, it appears that the dressing on the patient's elbow was never changed since our last visit. The 2 wounds have converged creating a larger wound. According to the intake nurse, there was a foul odor from the wound and dressing. There is extensive slough on the wound surface. The bleeding per rectum turns out to be hemorrhoids. The patient's son expresses that he does not know how to manage this. 04/07/2022: For unclear reasons, the patient has not returned to clinic until they were contacted today and they made an add-on visit. The patient's son was not originally present at the beginning of the visit but showed up about 25 minutes into the visit. The patient was accompanied by a home aide. She reported that when she cleaned the patient up today, there was just a Band-Aid on his wound. She said the surface was fairly mucky but she was able to wash this off. It is abundantly clear that this wound is not being properly cared for. Apparently he did have [facility] home health but they have not been out in some time and it is Brian [name], Brian [name] ([id number]) [id number] Page 5 of 8 not clear the reason for this. The surface of the wound is a bit cleaner so perhaps the Santyl has been applied, but unfortunately there is now a deep tunnel that extends up from the elbow to the patient's posterior upper arm for a number centimeters. There is no evidence of infection or purulent drainage. It is also clear that the patient continues to spend a substantial portion of his time leaning on the elbow. 05/23/2022: Once again, the patient has failed to return to  clinic for about 6 weeks and the reason remains unclear. He is apparently receiving home health assistance. The wound on his elbow  is covered with hypertrophic granulation tissue. He also has a blister just distal to the wound on his posterior forearm. He has a new stage III pressure ulcer on his left gluteus with slough accumulation. He apparently spends a substantial portion of his days sitting in a wheelchair. I do not believe he has a Financial trader. Due to his stroke, he lists to the left side, which is how his ulcer on his elbow developed. I suspect the gluteal ulcer is as a result of this, as well. 06/13/2022: The wound on his left gluteus is nearly healed. It is very superficial and quite clean. His son reports that he has just been applying Desitin to the area. The elbow wound is smaller but still has some hypertrophic granulation tissue and slough accumulation. No concern for infection at either site. 06/27/2022: The buttock wounds are healed. There is some tissue maceration without any significant skin breakdown. The elbow wound continues to contract. There is still some hypertrophic granulation tissue and slough buildup. 07/25/2022: The buttock wounds have reopened. The periwound skin has been kept very dry through liberal use of zinc oxide, however. The elbow wound dressing did not look like it had been changed in at least a week when it was removed. The underlying wound, however has not reaccumulated any hypertrophic granulation tissue; there is a thin layer of slough present. No malodor or purulent drainage from the wound itself. 08/15/2022: There is a tiny opening remaining on the buttocks. It is clean and there is no evidence of tissue maceration. The elbow looks very good. There is good beefy tissue present and it is flush with the surrounding skin. Home health has been coming out. Patient History Information obtained from Patient. Family History Diabetes - Maternal Grandparents, Heart Disease - Mother, Hypertension - Mother, No family history of Cancer, Hereditary Spherocytosis, Kidney Disease, Lung Disease,  Seizures, Stroke, Thyroid Problems, Tuberculosis. Social History Never smoker, Marital Status - Married, Alcohol Use - Never, Drug Use - No History, Caffeine Use - Never. Medical History Eyes Patient has history of Cataracts - removed Cardiovascular Patient has history of Arrhythmia - Atrial Flutter/Atrial Fib, Coronary Artery Disease, Hypotension Denies history of Hypertension Musculoskeletal Patient has history of Osteoarthritis Neurologic Denies history of Paraplegia Hospitalization/Surgery History - open heart surgery. - IR generic histoircal. - appendectomy. - hernia repair. - joint replacement. - mitral valve repair. - mvp repair. - total hip arthroplasty. Medical A Surgical History Notes nd Constitutional Symptoms (General Health) obseity Ear/Nose/Mouth/Throat wears hearing aids Cardiovascular Mitral Valve Repair, Genitourinary BPH Neurologic CVA 2018, left side hemiparesis Objective Constitutional No acute distress. Vitals Time Taken: 2:50 PM, Height: 66 in, Weight: 185 lbs, BMI: 29.9, Temperature: 98.2 F, Pulse: 69 bpm, Respiratory Rate: 18 breaths/min, Blood Pressure: 138/76 mmHg. Respiratory Normal work of breathing on room air. General Notes: 08/15/2022: There is a tiny opening remaining on the buttocks. It is clean and there is no evidence of tissue maceration. The elbow looks very good. There is good beefy tissue present and it is flush with the surrounding skin. Integumentary (Hair, Skin) Wound #1 status is Open. Original cause of wound was Pressure Injury. The date acquired was: 12/19/2021. The wound has been in treatment 26 weeks. The wound is located on the Left Elbow. The wound measures 2.2cm length x 2cm width x 0.2cm depth; 3.456cm^2 area and 0.691cm^3 volume. There is  Fat Layer (Subcutaneous Tissue) exposed. There is no tunneling or undermining noted. There is a medium amount of serosanguineous drainage noted. The wound margin is distinct with the outline  attached to the wound base. There is large (67-100%) red, hyper - granulation within the wound bed. There is a small (1-33%) amount Brian [name], Brian [name] ([id number]) [id number] Page 6 of 8 of necrotic tissue within the wound bed including Adherent Slough. The periwound skin appearance had no abnormalities noted for texture. The periwound skin appearance had no abnormalities noted for moisture. The periwound skin appearance had no abnormalities noted for color. Periwound temperature was noted as No Abnormality. The periwound has tenderness on palpation. Wound #2 status is Healed - Epithelialized. Original cause of wound was Pressure Injury. The date acquired was: 04/21/2022. The wound has been in treatment 12 weeks. The wound is located on the Left Gluteus. The wound measures 0cm length x 0cm width x 0cm depth; 0cm^2 area and 0cm^3 volume. There is Fat Layer (Subcutaneous Tissue) exposed. There is no tunneling or undermining noted. There is a medium amount of serosanguineous drainage noted. The wound margin is flat and intact. There is large (67-100%) red granulation within the wound bed. There is a small (1-33%) amount of necrotic tissue within the wound bed. The periwound skin appearance had no abnormalities noted for color. The periwound skin appearance exhibited: Excoriation, Rash, Dry/Scaly. The periwound skin appearance did not exhibit: Maceration. Periwound temperature was noted as No Abnormality. The periwound has tenderness on palpation. Assessment Active Problems ICD-10 Pressure ulcer of left elbow, stage 3 Cerebral infarction due to thrombosis of unspecified precerebral artery Other persistent atrial fibrillation Atherosclerotic heart disease of native coronary artery without angina pectoris Pressure ulcer of left buttock, stage 3 Plan Follow-up Appointments: Return appointment in 3 weeks. - Dr. Celine Ahr Room 3 Bathing/ Shower/ Hygiene: May shower and wash wound  with soap and water. - with dressing changes Off-Loading: Turn and reposition every 2 hours - stay off of left elbow wear prevalon boot on elbow as directed Other: - limit time in wheelchair, reposition in wheelchair every 1-2 hours Home Health: No change in wound care orders this week; continue Home Health for wound care. May utilize formulary equivalent dressing for wound treatment orders unless otherwise specified. Dressing changes to be completed by [place] on Monday / Wednesday / Friday except when patient has scheduled visit at [facility]. Other Home Health Orders/Instructions: - [facility] WOUND #1: - Elbow Wound Laterality: Left Cleanser: Soap and Water Every Other Day/30 Days Discharge Instructions: May shower and wash wound with dial antibacterial soap and water prior to dressing change. Cleanser: Wound Cleanser (Generic) Every Other Day/30 Days Discharge Instructions: Cleanse the wound with wound cleanser prior to applying a clean dressing using gauze sponges, not tissue or cotton balls. Prim Dressing: Sorbalgon AG Dressing, 4x4 (in/in) (Generic) Every Other Day/30 Days ary Discharge Instructions: Apply to wound bed as instructed Secondary Dressing: ALLEVYN Gentle Border, 5x5 (in/in) (Generic) Every Other Day/30 Days Discharge Instructions: Apply over primary dressing as directed. Add-Ons: Cotton Tip Applicator, 6 (in) (Generic) Every Other Day/30 Days 08/15/2022: There is a tiny opening remaining on the buttocks. It is clean and there is no evidence of tissue maceration. The elbow looks very good. There is good beefy tissue present and it is flush with the surrounding skin. No debridement was necessary today. We will continue silver alginate to the elbow with a foam border and heel cup for padding. Continue Desitin to the gluteal  cleft. Follow-up in 3 weeks. Electronic Signature(s) Signed: 08/15/2022 3:53:09 PM By: Blanche East RN Signed: 08/16/2022 8:36:05 AM By:  Brian Maudlin MD FACS Previous Signature: 08/15/2022 3:12:57 PM Version By: Brian Maudlin MD FACS Entered By: Blanche East on 08/15/2022 15:53:09 -------------------------------------------------------------------------------- HxROS Details Patient Name: Date of Service: Brian [name] 08/15/2022 2:45 PM Medical Record Number: [id number] Patient Account Number: [id number] Date of Birth/Sex: Treating RN: May 04, 1933 (88 y.o. Brian [name], Brian [name] ([id number]) [id number] Page 7 of 8 Primary Care Provider: Abelino [name] Other Clinician: Referring Provider: Treating Provider/Extender: Brian [name] in Treatment: 26 Information Obtained From Patient Constitutional Symptoms (General Health) Medical History: Past Medical History Notes: obseity Eyes Medical History: Positive for: Cataracts - removed Ear/Nose/Mouth/Throat Medical History: Past Medical History Notes: wears hearing aids Cardiovascular Medical History: Positive for: Arrhythmia - Atrial Flutter/Atrial Fib; Coronary Artery Disease; Hypotension Negative for: Hypertension Past Medical History Notes: Mitral Valve Repair, Genitourinary Medical History: Past Medical History Notes: BPH Musculoskeletal Medical History: Positive for: Osteoarthritis Neurologic Medical History: Negative for: Paraplegia Past Medical History Notes: CVA 2018, left side hemiparesis HBO Extended History Items Eyes: Cataracts Immunizations Pneumococcal Vaccine: Received Pneumococcal Vaccination: Yes Received Pneumococcal Vaccination On or After 60th Birthday: Yes Implantable Devices None Hospitalization / Surgery History Type of Hospitalization/Surgery open heart surgery IR generic histoircal appendectomy hernia repair joint replacement mitral valve repair mvp repair total hip arthroplasty Family and Social History Cancer: No; Diabetes: Yes - Maternal Grandparents;  Heart Disease: Yes - Mother; Hereditary Spherocytosis: No; Hypertension: Yes - Mother; Kidney Disease: No; Lung Disease: No; Seizures: No; Stroke: No; Thyroid Problems: No; Tuberculosis: No; Never smoker; Marital Status - Married; Alcohol Use: Brian [name], Brian [name] ([id number]) [id number] Page 8 of 8 Never; Drug Use: No History; Caffeine Use: Never; Financial Concerns: No; Food, Clothing or Shelter Needs: No; Support System Lacking: No; Transportation Concerns: No Electronic Signature(s) Signed: 08/15/2022 3:22:41 PM By: Brian Maudlin MD FACS Entered By: Brian [name] on 08/15/2022 15:11:48 -------------------------------------------------------------------------------- SuperBill Details Patient Name: Date of Service: Brian [name] 08/15/2022 Medical Record Number: [id number] Patient Account Number: [id number] Date of Birth/Sex: Treating RN: [birth year] (88 y.o. [name]) Primary Care Provider: Abelino [name] Other Clinician: Referring Provider: Treating Provider/Extender: Brian [name] in Treatment: 26 Diagnosis Coding ICD-10 Codes Code Description [id number] Pressure ulcer of left elbow, stage 3 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 I48.19 Other persistent atrial fibrillation I25.10 Atherosclerotic heart disease of native coronary artery without angina pectoris L89.323 Pressure ulcer of left buttock, stage 3 Facility Procedures : CPT4 Code: [id number] Description: 99214 - WOUND CARE VISIT-LEV 4 EST PT Modifier: 25 Quantity: 1 Physician Procedures : CPT4 Code Description Modifier [id number] - WC PHYS LEVEL 3 - EST PT ICD-10 Diagnosis Description L89.023 Pressure ulcer of left elbow, stage 3 L89.323 Pressure ulcer of left buttock, stage 3 I63.00 Cerebral infarction due to thrombosis of  unspecified precerebral artery I25.10 Atherosclerotic heart disease of native coronary artery without  angina pectoris Quantity: 1 Electronic Signature(s) Signed: 08/16/2022 11:37:20 AM By: Blanche East RN Signed: 08/16/2022 12:13:53 PM By: Brian Maudlin MD FACS Previous Signature: 08/16/2022 11:37:08 AM Version By: Blanche East RN Previous Signature: 08/15/2022 3:13:11 PM Version By: Brian Maudlin MD FACS Entered By: Blanche East on 08/16/2022 11:37:19

## 2022-08-16 DIAGNOSIS — I69354 Hemiplegia and hemiparesis following cerebral infarction affecting left non-dominant side: Secondary | ICD-10-CM | POA: Diagnosis not present

## 2022-08-16 DIAGNOSIS — I2510 Atherosclerotic heart disease of native coronary artery without angina pectoris: Secondary | ICD-10-CM | POA: Diagnosis not present

## 2022-08-16 DIAGNOSIS — L89322 Pressure ulcer of left buttock, stage 2: Secondary | ICD-10-CM | POA: Diagnosis not present

## 2022-08-16 DIAGNOSIS — I4819 Other persistent atrial fibrillation: Secondary | ICD-10-CM | POA: Diagnosis not present

## 2022-08-16 DIAGNOSIS — Z7901 Long term (current) use of anticoagulants: Secondary | ICD-10-CM | POA: Diagnosis not present

## 2022-08-16 DIAGNOSIS — L89023 Pressure ulcer of left elbow, stage 3: Secondary | ICD-10-CM | POA: Diagnosis not present

## 2022-08-18 ENCOUNTER — Telehealth: Payer: Self-pay | Admitting: Family Medicine

## 2022-08-18 NOTE — Telephone Encounter (Signed)
Patient states: - Brian [name] has sent over proper paper work for patient to restart PT  - For additional information, Brian [name] can be reached at [phone]  Patient requests: -This be completed as soon as possible

## 2022-08-18 NOTE — Progress Notes (Signed)
ISSAIH, KAUS ([id number]) [id number] Page 1 of 10 Visit Report for 08/15/2022 Arrival Information Details Patient Name: Date of Service: Brian [name] 08/15/2022 2:45 PM Medical Record Number: [id number] Patient Account Number: [id number] Date of Birth/Sex: Treating RN: [birth year] (88 y.[name] Brian [name] Primary Care Brian [name]: Brian [name] Other Clinician: Referring Brian [name]: Treating Brian [name]/Extender: Brian [name] in Treatment: 16 Visit Information History Since Last Visit Added or deleted any medications: No Patient Arrived: Wheel Chair Any new allergies or adverse reactions: No Arrival Time: 14:49 Had a fall or experienced change in No Accompanied By: son activities of daily living that may affect Transfer Assistance: None risk of falls: Patient Identification Verified: Yes Signs or symptoms of abuse/neglect since last visito No Secondary Verification Process Completed: Yes Hospitalized since last visit: No Patient Requires Transmission-Based Precautions: No Implantable device outside of the clinic excluding No Patient Has Alerts: Yes cellular tissue based products placed in the center Patient Alerts: Patient on Blood Thinner since last visit: Has Dressing in Place as Prescribed: Yes Pain Present Now: Yes Electronic Signature(s) Signed: 08/15/2022 4:17:15 PM By: Brian [name] Entered By: Brian [name] on 08/15/2022 14:51:52 -------------------------------------------------------------------------------- Clinic Level of Care Assessment Details Patient Name: Date of Service: Brian [name] 08/15/2022 2:45 PM Medical Record Number: [id number] Patient Account Number: [id number] Date of Birth/Sex: Treating RN: Brian [name] (29 y.[name] [name] Primary Care Brian [name]: Brian [name] Other Clinician: Referring Brian [name]: Treating Brian [name]/Extender: Brian [name]  in Treatment: 26 Clinic Level of Care Assessment Items TOOL 4 Quantity Score X- 1 0 Use when only an EandM is performed on FOLLOW-UP visit ASSESSMENTS - Nursing Assessment / Reassessment X- 1 10 Reassessment of Co-morbidities (includes updates in patient status) X- 1 5 Reassessment of Adherence to Treatment Plan ASSESSMENTS - Wound and Skin A ssessment / Reassessment '[]'$  - 0 Simple Wound Assessment / Reassessment - one wound X- 2 5 Complex Wound Assessment / Reassessment - multiple wounds '[]'$  - 0 Dermatologic / Skin Assessment (not related to wound area) ASSESSMENTS - Focused Assessment '[]'$  - 0 Circumferential Edema Measurements - multi extremities '[]'$  - 0 Nutritional Assessment / Counseling / Intervention Brian [name] ([id number]) [id number] Page 2 of 10 '[]'$  - 0 Lower Extremity Assessment (monofilament, tuning fork, pulses) '[]'$  - 0 Peripheral Arterial Disease Assessment (using hand held doppler) ASSESSMENTS - Ostomy and/or Continence Assessment and Care '[]'$  - 0 Incontinence Assessment and Management '[]'$  - 0 Ostomy Care Assessment and Management (repouching, etc.) PROCESS - Coordination of Care '[]'$  - 0 Simple Patient / Family Education for ongoing care X- 1 20 Complex (extensive) Patient / Family Education for ongoing care X- 1 10 Staff obtains Programmer, systems, Records, T Results / Process Orders est X- 1 10 Staff telephones HHA, Nursing Homes / Clarify orders / etc '[]'$  - 0 Routine Transfer to another Facility (non-emergent condition) '[]'$  - 0 Routine Hospital Admission (non-emergent condition) '[]'$  - 0 New Admissions / Biomedical engineer / Ordering NPWT Apligraf, etc. , '[]'$  - 0 Emergency Hospital Admission (emergent condition) '[]'$  - 0 Simple Discharge Coordination '[]'$  - 0 Complex (extensive) Discharge Coordination PROCESS - Special Needs '[]'$  - 0 Pediatric / Minor Patient Management '[]'$  - 0 Isolation Patient Management '[]'$  - 0 Hearing / Language /  Visual special needs '[]'$  - 0 Assessment of Community assistance (transportation, D/C planning, etc.) '[]'$  - 0 Additional assistance / Altered mentation '[]'$  - 0 Support Surface(s) Assessment (bed, cushion, seat, etc.) INTERVENTIONS -  Wound Cleansing / Measurement '[]'$  - 0 Simple Wound Cleansing - one wound X- 2 5 Complex Wound Cleansing - multiple wounds '[]'$  - 0 Wound Imaging (photographs - any number of wounds) '[]'$  - 0 Wound Tracing (instead of photographs) '[]'$  - 0 Simple Wound Measurement - one wound X- 2 5 Complex Wound Measurement - multiple wounds INTERVENTIONS - Wound Dressings '[]'$  - 0 Small Wound Dressing one or multiple wounds X- 2 15 Medium Wound Dressing one or multiple wounds '[]'$  - 0 Large Wound Dressing one or multiple wounds '[]'$  - 0 Application of Medications - topical '[]'$  - 0 Application of Medications - injection INTERVENTIONS - Miscellaneous '[]'$  - 0 External ear exam '[]'$  - 0 Specimen Collection (cultures, biopsies, blood, body fluids, etc.) '[]'$  - 0 Specimen(s) / Culture(s) sent or taken to Lab for analysis '[]'$  - 0 Patient Transfer (multiple staff / Civil Service fast streamer / Similar devices) '[]'$  - 0 Simple Staple / Suture removal (25 or less) '[]'$  - 0 Complex Staple / Suture removal (26 or more) '[]'$  - 0 Hypo / Hyperglycemic Management (close monitor of Blood Glucose) Brian [name] ([id number]) [id number] Page 3 of 10 '[]'$  - 0 Ankle / Brachial Index (ABI) - do not check if billed separately X- 1 5 Vital Signs Has the patient been seen at the hospital within the last three years: Yes Total Score: 120 Level Of Care: New/Established - Level 4 Electronic Signature(s) Signed: 08/18/2022 12:15:24 PM By: Brian East RN Entered By: Brian [name] on 08/16/2022 11:36:55 -------------------------------------------------------------------------------- Encounter Discharge Information Details Patient Name: Date of Service: Brian [name] 08/15/2022 2:45  PM Medical Record Number: [id number] Patient Account Number: [id number] Date of Birth/Sex: Treating RN: Brian [birth year], [name] (38 y.[name] [name] Primary Care Ebbie Sorenson: Brian [name] Other Clinician: Referring Luisangel [name]: Treating Shandrika Ambers/Extender: Brian [name] in Treatment: 26 Encounter Discharge Information Items Discharge Condition: Stable Ambulatory Status: Wheelchair Discharge Destination: Home Transportation: Private Auto Accompanied By: son Schedule Follow-up Appointment: Yes Clinical Summary of Care: Electronic Signature(s) Signed: 08/15/2022 3:49:46 PM By: Brian East RN Entered By: Brian [name] on 08/15/2022 15:49:46 -------------------------------------------------------------------------------- Lower Extremity Assessment Details Patient Name: Date of Service: [place], DA V ID [name] 08/15/2022 2:45 PM Medical Record Number: [id number] Patient Account Number: [id number] Date of Birth/Sex: Treating RN: [birth year] (38 y.[name] Brian [name] Primary Care Shelvie Salsberry: Brian [name] Other Clinician: Referring Lennox Leikam: Treating Cem Kosman/Extender: Brian [name] in Treatment: 26 Electronic Signature(s) Signed: 08/15/2022 4:17:15 PM By: Sabas Sous By: Brian [name] on 08/15/2022 14:52:25 -------------------------------------------------------------------------------- Multi Wound Chart Details Patient Name: Date of Service: Brian [name] 08/15/2022 2:45 PM Medical Record Number: [id number] Patient Account Number: [id number] Brian [name], Brian [name] ([id number]) [id number] Page 4 of 10 Date of Birth/Sex: Treating RN: [birth year][name] (89 y.[name] [name]) Primary Care Briante Loveall: Other Clinician: Abelino [name] Referring Jasmeen Fritsch: Treating Karis Emig/Extender: Brian [name] in Treatment: 26 [1:Photos:] [N/A:N/A] Left Elbow Left Gluteus N/A Wound Location: Pressure Injury  Pressure Injury N/A Wounding Event: Pressure Ulcer Pressure Ulcer N/A Primary Etiology: Cataracts, Arrhythmia, Coronary Cataracts, Arrhythmia, Coronary N/A Comorbid History: Artery Disease, Hypotension, Artery Disease, Hypotension, Osteoarthritis Osteoarthritis 12/19/2021 04/21/2022 N/A Date Acquired: 26 12 N/A Weeks of Treatment: Open Open N/A Wound Status: No No N/A Wound Recurrence: Yes No N/A Clustered Wound: 2.2x2x0.2 0.1x0.1x0.1 N/A Measurements L x W x D (cm) 3.456 0.008 N/A A (cm) : rea 0.691 0.001 N/A Volume (cm) : 64.10% 99.20% N/A % Reduction in A rea: 28.20%  99.00% N/A % Reduction in Volume: Category/Stage III Category/Stage III N/A Classification: Medium Medium N/A Exudate A mount: Serosanguineous Serosanguineous N/A Exudate Type: red, brown red, brown N/A Exudate Color: Distinct, outline attached Flat and Intact N/A Wound Margin: Large (67-100%) Large (67-100%) N/A Granulation A mount: Red, Hyper-granulation Red N/A Granulation Quality: Small (1-33%) Small (1-33%) N/A Necrotic A mount: Fat Layer (Subcutaneous Tissue): Yes Fat Layer (Subcutaneous Tissue): Yes N/A Exposed Structures: Fascia: No Fascia: No Tendon: No Tendon: No Muscle: No Muscle: No Joint: No Joint: No Bone: No Bone: No Small (1-33%) Small (1-33%) N/A Epithelialization: No Abnormalities Noted Excoriation: Yes N/A Periwound Skin Texture: Rash: Yes No Abnormalities Noted Dry/Scaly: Yes N/A Periwound Skin Moisture: Maceration: No No Abnormalities Noted No Abnormalities Noted N/A Periwound Skin Color: No Abnormality No Abnormality N/A Temperature: Yes Yes N/A Tenderness on Palpation: Treatment Notes Electronic Signature(s) Signed: 08/15/2022 3:10:43 PM By: Fredirick Maudlin MD FACS Entered By: Fredirick Maudlin on 08/15/2022 15:10:43 -------------------------------------------------------------------------------- Multi-Disciplinary Care Plan Details Patient Name: Date of  Service: Brian [name] 08/15/2022 2:45 PM Medical Record Number: [id number] Patient Account Number: [id number] Date of Birth/Sex: Treating RN: [birth year] (85 y.[name] [name] Primary Care Aveya Beal: Brian [name] Other Clinician: Referring River Mckercher: Treating Bryer Cozzolino/Extender: Brian [name] in Treatment: [address] [place] ([id number]) [id number] Page 5 of 10 Multidisciplinary Care Plan reviewed with physician Active Inactive Abuse / Safety / Falls / Self Care Management Nursing Diagnoses: Impaired physical mobility Potential for falls Goals: Patient/caregiver will verbalize understanding of skin care regimen Date Initiated: 02/14/2022 Target Resolution Date: 08/20/2022 Goal Status: Active Patient/caregiver will verbalize/demonstrate measures taken to prevent injury and/or falls Date Initiated: 02/14/2022 Target Resolution Date: 08/20/2022 Goal Status: Active Interventions: Assess fall risk on admission and as needed Assess self care needs on admission and as needed Notes: Wound/Skin Impairment Nursing Diagnoses: Impaired tissue integrity Knowledge deficit related to ulceration/compromised skin integrity Goals: Patient/caregiver will verbalize understanding of skin care regimen Date Initiated: 02/14/2022 Target Resolution Date: 08/20/2022 Goal Status: Active Ulcer/skin breakdown will have a volume reduction of 30% by week 4 Date Initiated: 02/14/2022 Date Inactivated: 05/23/2022 Target Resolution Date: 05/05/2022 Goal Status: Unmet Unmet Reason: new PU left glut Interventions: Assess patient/caregiver ability to obtain necessary supplies Assess patient/caregiver ability to perform ulcer/skin care regimen upon admission and as needed Assess ulceration(s) every visit Treatment Activities: Skin care regimen initiated : 02/14/2022 Topical wound management initiated : 02/14/2022 Notes: Electronic  Signature(s) Signed: 08/15/2022 3:50:52 PM By: Brian East RN Previous Signature: 08/15/2022 3:50:38 PM Version By: Brian East RN Entered By: Brian [name] on 08/15/2022 15:50:52 -------------------------------------------------------------------------------- Pain Assessment Details Patient Name: Date of Service: Brian [name] 08/15/2022 2:45 PM Medical Record Number: [id number] Patient Account Number: [id number] Date of Birth/Sex: Treating RN: [birth year][name] (80 y.[name] Brian [name] Primary Care Wilhelm Ganaway: Brian [name] Other Clinician: Referring Aki Burdin: Treating Nataley Bahri/Extender: Brian [name] in Treatment: 26 Active Problems Location of Pain Severity and Description of Pain Patient Has Brian [name], Brian [name] ([id number]) [id number] Page 6 of 10 Patient Has Paino Yes Site Locations Pain Location: Pain in Ulcers Duration of the Pain. Constant / Intermittento Constant Rate the pain. Current Pain Level: 5 Pain Management and Medication Current Pain Management: Electronic Signature(s) Signed: 08/15/2022 4:17:15 PM By: Brian [name] Entered By: Brian [name] on 08/15/2022 14:52:03 -------------------------------------------------------------------------------- Patient/Caregiver Education Details Patient Name: Date of Service: Brian [name] 12/26/2023andnbsp2:45 PM Medical Record Number: [id number] Patient Account  Number: [id number] Date of Birth/Gender: Treating RN: [birth year] (25 y.[name] [name] Primary Care Physician: Brian [name] Other Clinician: Referring Physician: Treating Physician/Extender: Brian [name] in Treatment: 26 Education Assessment Education Provided To: Patient Education Topics Provided Wound Debridement: Methods: Explain/Verbal Responses: Reinforcements needed, State content correctly Wound/Skin Impairment: Methods:  Explain/Verbal Responses: Reinforcements needed, State content correctly Electronic Signature(s) Signed: 08/18/2022 12:15:24 PM By: Brian East RN Entered By: Brian [name] on 08/15/2022 15:51:08 Yarde, Brian [name] ([id number]) [id number] Page 7 of 10 -------------------------------------------------------------------------------- Wound Assessment Details Patient Name: Date of Service: Brian [name] 08/15/2022 2:45 PM Medical Record Number: [id number] Patient Account Number: [id number] Date of Birth/Sex: Treating RN: [birth year][name] (8 y.[name] [name] Primary Care Adalyne Lovick: Brian [name] Other Clinician: Referring Tyann Niehaus: Treating Erland Vivas/Extender: Brian [name] in Treatment: 26 Wound Status Wound Number: 1 Primary Pressure Ulcer Etiology: Wound Location: Left Elbow Wound Status: Open Wounding Event: Pressure Injury Comorbid Cataracts, Arrhythmia, Coronary Artery Disease, Date Acquired: 12/19/2021 History: Hypotension, Osteoarthritis Weeks Of Treatment: 26 Clustered Wound: Yes Photos Wound Measurements Length: (cm) 2.2 Width: (cm) 2 Depth: (cm) 0.2 Area: (cm) 3.456 Volume: (cm) 0.691 % Reduction in Area: 64.1% % Reduction in Volume: 28.2% Epithelialization: Small (1-33%) Tunneling: No Undermining: No Wound Description Classification: Category/Stage III Wound Margin: Distinct, outline attached Exudate Amount: Medium Exudate Type: Serosanguineous Exudate Color: red, brown Foul Odor After Cleansing: No Slough/Fibrino Yes Wound Bed Granulation Amount: Large (67-100%) Exposed Structure Granulation Quality: Red, Hyper-granulation Fascia Exposed: No Necrotic Amount: Small (1-33%) Fat Layer (Subcutaneous Tissue) Exposed: Yes Necrotic Quality: Adherent Slough Tendon Exposed: No Muscle Exposed: No Joint Exposed: No Bone Exposed: No Periwound Skin Texture Texture Color No Abnormalities Noted: Yes No  Abnormalities Noted: Yes Moisture Temperature / Pain No Abnormalities Noted: Yes Temperature: No Abnormality Tenderness on Palpation: Yes Treatment Notes Wound #1 (Elbow) Wound Laterality: Left Cleanser Soap and Water Discharge Instruction: May shower and wash wound with dial antibacterial soap and water prior to dressing change. Wound Cleanser Discharge Instruction: Cleanse the wound with wound cleanser prior to applying a clean dressing using gauze sponges, not tissue or cotton balls. Peri-Wound Care Brian [name], Brian [name] ([id number]) [id number] Page 8 of 10 Topical Primary Dressing Sorbalgon AG Dressing, 4x4 (in/in) Discharge Instruction: Apply to wound bed as instructed Secondary Dressing ALLEVYN Gentle Border, 5x5 (in/in) Discharge Instruction: Apply over primary dressing as directed. Secured With Compression Wrap Compression Stockings Add-Ons Soil scientist, 6 (in) Electronic Signature(s) Signed: 08/18/2022 12:15:24 PM By: Brian East RN Entered By: Brian [name] on 08/15/2022 15:00:58 -------------------------------------------------------------------------------- Wound Assessment Details Patient Name: Date of Service: Brian [name] 08/15/2022 2:45 PM Medical Record Number: [id number] Patient Account Number: [id number] Date of Birth/Sex: Treating RN: [birth year][name] (72 y.[name] [name] Primary Care Stewart Sasaki: Brian [name] Other Clinician: Referring Zayan Delvecchio: Treating Ayala Ribble/Extender: Brian [name] in Treatment: 26 Wound Status Wound Number: 2 Primary Pressure Ulcer Etiology: Wound Location: Left Gluteus Wound Status: Healed - Epithelialized Wounding Event: Pressure Injury Comorbid Cataracts, Arrhythmia, Coronary Artery Disease, Date Acquired: 04/21/2022 History: Hypotension, Osteoarthritis Weeks Of Treatment: 12 Clustered Wound: No Photos Wound Measurements Length: (cm) Width: (cm) Depth:  (cm) Area: (cm) Volume: (cm) 0 % Reduction in Area: 99.2% 0 % Reduction in Volume: 99% 0 Epithelialization: Small (1-33%) 0 Tunneling: No 0 Undermining: No Wound Description Classification: Category/Stage III Wound Margin: Flat and Intact Exudate Amount: Medium Exudate Type: Serosanguineous Exudate Color: red, brown Brian [name], Brian [name] ([id number]) Wound Bed  Granulation Amount: Large (67-100%) Granulation Quality: Red Necrotic Amount: Small (1-33%) Foul Odor After Cleansing: No Slough/Fibrino Yes [phone].pdf Page 9 of 10 Exposed Structure Fascia Exposed: No Fat Layer (Subcutaneous Tissue) Exposed: Yes Tendon Exposed: No Muscle Exposed: No Joint Exposed: No Bone Exposed: No Periwound Skin Texture Texture Color No Abnormalities Noted: No No Abnormalities Noted: Yes Excoriation: Yes Temperature / Pain Rash: Yes Temperature: No Abnormality Tenderness on Palpation: Yes Moisture No Abnormalities Noted: No Dry / Scaly: Yes Maceration: No Treatment Notes Wound #2 (Gluteus) Wound Laterality: Left Cleanser Peri-Wound Care Zinc Oxide Ointment 30g tube Discharge Instruction: Apply Zinc Oxide to apply to around the 3 small wounds on the gluteus Topical Primary Dressing Sorbalgon AG Dressing, 4x4 (in/in) Discharge Instruction: Apply to wound bed as instructed Secondary Dressing ALLEVYN Gentle Border, 3x3 (in/in) Discharge Instruction: Apply over primary dressing as directed. Secured With Compression Wrap Compression Stockings Environmental education officer) Signed: 08/18/2022 12:15:24 PM By: Brian East RN Entered By: Brian [name] on 08/15/2022 15:51:26 -------------------------------------------------------------------------------- Vitals Details Patient Name: Date of Service: Brian [name] 08/15/2022 2:45 PM Medical Record Number: [id number] Patient Account Number: [id number] Date of Birth/Sex: Treating RN: [birth year] (36 y.[name] [name] Primary Care Adayah Arocho: Brian [name] Other Clinician: Referring Aryah Doering: Treating Stasia Somero/Extender: Brian [name] in Treatment: 26 Vital Signs Time Taken: 14:50 Temperature (F): 98.2 Height (in): 66 Pulse (bpm): 69 Weight (lbs): 185 Respiratory Rate (breaths/min): 18 Body Mass Index (BMI): 29.9 Blood Pressure (mmHg): 138/76 Reference Range: 80 - 120 mg / dl Brian [name], Brian [name] ([id number]) [id number] Page 10 of 10 Electronic Signature(s) Signed: 08/15/2022 3:49:17 PM By: Brian East RN Entered By: Brian [name] on 08/15/2022 15:49:17

## 2022-08-22 DIAGNOSIS — I2510 Atherosclerotic heart disease of native coronary artery without angina pectoris: Secondary | ICD-10-CM | POA: Diagnosis not present

## 2022-08-22 DIAGNOSIS — I69354 Hemiplegia and hemiparesis following cerebral infarction affecting left non-dominant side: Secondary | ICD-10-CM | POA: Diagnosis not present

## 2022-08-22 DIAGNOSIS — Z7901 Long term (current) use of anticoagulants: Secondary | ICD-10-CM | POA: Diagnosis not present

## 2022-08-22 DIAGNOSIS — L89023 Pressure ulcer of left elbow, stage 3: Secondary | ICD-10-CM | POA: Diagnosis not present

## 2022-08-22 DIAGNOSIS — I4819 Other persistent atrial fibrillation: Secondary | ICD-10-CM | POA: Diagnosis not present

## 2022-08-22 DIAGNOSIS — L89322 Pressure ulcer of left buttock, stage 2: Secondary | ICD-10-CM | POA: Diagnosis not present

## 2022-08-24 ENCOUNTER — Other Ambulatory Visit: Payer: Self-pay | Admitting: Family Medicine

## 2022-08-24 DIAGNOSIS — N401 Enlarged prostate with lower urinary tract symptoms: Secondary | ICD-10-CM

## 2022-08-24 DIAGNOSIS — K219 Gastro-esophageal reflux disease without esophagitis: Secondary | ICD-10-CM

## 2022-08-25 DIAGNOSIS — Z952 Presence of prosthetic heart valve: Secondary | ICD-10-CM | POA: Diagnosis not present

## 2022-08-25 DIAGNOSIS — I4819 Other persistent atrial fibrillation: Secondary | ICD-10-CM | POA: Diagnosis not present

## 2022-08-25 DIAGNOSIS — I2510 Atherosclerotic heart disease of native coronary artery without angina pectoris: Secondary | ICD-10-CM | POA: Diagnosis not present

## 2022-08-25 DIAGNOSIS — Z7901 Long term (current) use of anticoagulants: Secondary | ICD-10-CM | POA: Diagnosis not present

## 2022-08-25 DIAGNOSIS — L89322 Pressure ulcer of left buttock, stage 2: Secondary | ICD-10-CM | POA: Diagnosis not present

## 2022-08-25 DIAGNOSIS — Z86718 Personal history of other venous thrombosis and embolism: Secondary | ICD-10-CM | POA: Diagnosis not present

## 2022-08-25 DIAGNOSIS — I69354 Hemiplegia and hemiparesis following cerebral infarction affecting left non-dominant side: Secondary | ICD-10-CM | POA: Diagnosis not present

## 2022-08-25 DIAGNOSIS — M179 Osteoarthritis of knee, unspecified: Secondary | ICD-10-CM | POA: Diagnosis not present

## 2022-08-25 DIAGNOSIS — I119 Hypertensive heart disease without heart failure: Secondary | ICD-10-CM | POA: Diagnosis not present

## 2022-08-25 DIAGNOSIS — L89023 Pressure ulcer of left elbow, stage 3: Secondary | ICD-10-CM | POA: Diagnosis not present

## 2022-08-28 DIAGNOSIS — I4819 Other persistent atrial fibrillation: Secondary | ICD-10-CM | POA: Diagnosis not present

## 2022-08-28 DIAGNOSIS — I2510 Atherosclerotic heart disease of native coronary artery without angina pectoris: Secondary | ICD-10-CM | POA: Diagnosis not present

## 2022-08-28 DIAGNOSIS — L89023 Pressure ulcer of left elbow, stage 3: Secondary | ICD-10-CM | POA: Diagnosis not present

## 2022-08-28 DIAGNOSIS — L89322 Pressure ulcer of left buttock, stage 2: Secondary | ICD-10-CM | POA: Diagnosis not present

## 2022-08-28 DIAGNOSIS — I69354 Hemiplegia and hemiparesis following cerebral infarction affecting left non-dominant side: Secondary | ICD-10-CM | POA: Diagnosis not present

## 2022-08-28 DIAGNOSIS — Z7901 Long term (current) use of anticoagulants: Secondary | ICD-10-CM | POA: Diagnosis not present

## 2022-08-29 ENCOUNTER — Encounter: Payer: Medicare Other | Admitting: Student in an Organized Health Care Education/Training Program

## 2022-08-31 DIAGNOSIS — L89023 Pressure ulcer of left elbow, stage 3: Secondary | ICD-10-CM | POA: Diagnosis not present

## 2022-08-31 DIAGNOSIS — Z7901 Long term (current) use of anticoagulants: Secondary | ICD-10-CM | POA: Diagnosis not present

## 2022-08-31 DIAGNOSIS — I69354 Hemiplegia and hemiparesis following cerebral infarction affecting left non-dominant side: Secondary | ICD-10-CM | POA: Diagnosis not present

## 2022-08-31 DIAGNOSIS — I2510 Atherosclerotic heart disease of native coronary artery without angina pectoris: Secondary | ICD-10-CM | POA: Diagnosis not present

## 2022-08-31 DIAGNOSIS — I4819 Other persistent atrial fibrillation: Secondary | ICD-10-CM | POA: Diagnosis not present

## 2022-08-31 DIAGNOSIS — L89322 Pressure ulcer of left buttock, stage 2: Secondary | ICD-10-CM | POA: Diagnosis not present

## 2022-09-01 ENCOUNTER — Telehealth: Payer: Self-pay | Admitting: Family Medicine

## 2022-09-01 DIAGNOSIS — Z7901 Long term (current) use of anticoagulants: Secondary | ICD-10-CM | POA: Diagnosis not present

## 2022-09-01 DIAGNOSIS — I4819 Other persistent atrial fibrillation: Secondary | ICD-10-CM | POA: Diagnosis not present

## 2022-09-01 DIAGNOSIS — L89023 Pressure ulcer of left elbow, stage 3: Secondary | ICD-10-CM | POA: Diagnosis not present

## 2022-09-01 DIAGNOSIS — I2510 Atherosclerotic heart disease of native coronary artery without angina pectoris: Secondary | ICD-10-CM | POA: Diagnosis not present

## 2022-09-01 DIAGNOSIS — L89322 Pressure ulcer of left buttock, stage 2: Secondary | ICD-10-CM | POA: Diagnosis not present

## 2022-09-01 DIAGNOSIS — I69354 Hemiplegia and hemiparesis following cerebral infarction affecting left non-dominant side: Secondary | ICD-10-CM | POA: Diagnosis not present

## 2022-09-01 NOTE — Telephone Encounter (Signed)
Please review and advise.   Thanks.  Dm/cma  

## 2022-09-01 NOTE — Telephone Encounter (Signed)
Sherice from [facility] home health request verbal orders for pt .sherice # [phone]  2wk3  1wk1

## 2022-09-01 NOTE — Telephone Encounter (Signed)
Lft VM to rtn call. Dm/cma  

## 2022-09-04 NOTE — Telephone Encounter (Signed)
Returned call for verbal orders, no identified VM LMTCB regarding orders.

## 2022-09-05 ENCOUNTER — Encounter (HOSPITAL_BASED_OUTPATIENT_CLINIC_OR_DEPARTMENT_OTHER): Payer: Medicare Other | Attending: General Surgery | Admitting: General Surgery

## 2022-09-05 DIAGNOSIS — L89023 Pressure ulcer of left elbow, stage 3: Secondary | ICD-10-CM | POA: Diagnosis not present

## 2022-09-05 DIAGNOSIS — I4819 Other persistent atrial fibrillation: Secondary | ICD-10-CM | POA: Diagnosis not present

## 2022-09-05 DIAGNOSIS — Z86718 Personal history of other venous thrombosis and embolism: Secondary | ICD-10-CM | POA: Diagnosis not present

## 2022-09-05 DIAGNOSIS — I6300 Cerebral infarction due to thrombosis of unspecified precerebral artery: Secondary | ICD-10-CM | POA: Diagnosis not present

## 2022-09-05 DIAGNOSIS — I10 Essential (primary) hypertension: Secondary | ICD-10-CM | POA: Insufficient documentation

## 2022-09-05 DIAGNOSIS — L89323 Pressure ulcer of left buttock, stage 3: Secondary | ICD-10-CM | POA: Diagnosis not present

## 2022-09-05 DIAGNOSIS — I4892 Unspecified atrial flutter: Secondary | ICD-10-CM | POA: Diagnosis not present

## 2022-09-05 DIAGNOSIS — N400 Enlarged prostate without lower urinary tract symptoms: Secondary | ICD-10-CM | POA: Insufficient documentation

## 2022-09-05 DIAGNOSIS — I2510 Atherosclerotic heart disease of native coronary artery without angina pectoris: Secondary | ICD-10-CM | POA: Diagnosis not present

## 2022-09-05 DIAGNOSIS — I69351 Hemiplegia and hemiparesis following cerebral infarction affecting right dominant side: Secondary | ICD-10-CM | POA: Insufficient documentation

## 2022-09-05 DIAGNOSIS — M1990 Unspecified osteoarthritis, unspecified site: Secondary | ICD-10-CM | POA: Insufficient documentation

## 2022-09-05 NOTE — Progress Notes (Addendum)
ORIEN, MAYHALL ([id number]) [id number] Page 1 of 7 Visit Report for 09/05/2022 Arrival Information Details Patient Name: Date of Service: [name] ID M. 09/05/2022 2:00 PM Medical Record Number: [id number] Patient Account Number: [id number] Date of Birth/Sex: Treating RN: 1933/02/06 (89 y.o. M) Primary Care Kester Stimpson: Abelino Derrick Other Clinician: Referring Avant Printy: Treating Jarett Dralle/Extender: Sandi Raveling in Treatment: 29 Visit Information History Since Last Visit All ordered tests and consults were completed: No Patient Arrived: Wheel Chair Added or deleted any medications: No Arrival Time: 14:05 Any new allergies or adverse reactions: No Accompanied By: son Had a fall or experienced change in No Transfer Assistance: Manual activities of daily living that may affect Patient Identification Verified: Yes risk of falls: Secondary Verification Process Completed: Yes Signs or symptoms of abuse/neglect since last visito No Patient Requires Transmission-Based Precautions: No Hospitalized since last visit: No Patient Has Alerts: Yes Implantable device outside of the clinic excluding No Patient Alerts: Patient on Blood Thinner cellular tissue based products placed in the center since last visit: Pain Present Now: No Electronic Signature(s) Signed: 09/05/2022 2:24:29 PM By: Worthy Rancher Entered By: Worthy Rancher on 09/05/2022 14:05:51 -------------------------------------------------------------------------------- Encounter Discharge Information Details Patient Name: Date of Service: [name] ID M. 09/05/2022 2:00 PM Medical Record Number: [id number] Patient Account Number: [id number] Date of Birth/Sex: Treating RN: 06/27/1933 (89 y.o. [name] Primary Care Chemika Nightengale: Abelino Derrick Other Clinician: Referring Tarahji Ramthun: Treating Aribelle Mccosh/Extender: Sandi Raveling in Treatment:  29 Encounter Discharge Information Items Post Procedure Vitals Discharge Condition: Stable Temperature (F): 98.1 Ambulatory Status: Wheelchair Pulse (bpm): 86 Discharge Destination: Home Respiratory Rate (breaths/min): 20 Transportation: Private Auto Blood Pressure (mmHg): 111/70 Accompanied By: son Schedule Follow-up Appointment: Yes Clinical Summary of Care: Patient Declined Electronic Signature(s) Signed: 09/05/2022 4:21:13 PM By: Dellie Catholic RN Entered By: Dellie Catholic on 09/05/2022 [place], [place] ([id number]) [id number] Page 2 of 7 -------------------------------------------------------------------------------- Lower Extremity Assessment Details Patient Name: Date of Service: [name] ID M. 09/05/2022 2:00 PM Medical Record Number: [id number] Patient Account Number: [id number] Date of Birth/Sex: Treating RN: 07-01-1933 (89 y.o. [name] Primary Care Lillian Ballester: Abelino Derrick Other Clinician: Referring Hope Holst: Treating Ailed Defibaugh/Extender: Sandi Raveling in Treatment: 29 Electronic Signature(s) Signed: 09/05/2022 4:21:13 PM By: Dellie Catholic RN Entered By: Dellie Catholic on 09/05/2022 14:25:19 -------------------------------------------------------------------------------- Multi Wound Chart Details Patient Name: Date of Service: Waynard Edwards, DA V ID M. 09/05/2022 2:00 PM Medical Record Number: [id number] Patient Account Number: [id number] Date of Birth/Sex: Treating RN: [birth year] (89 y.o. M) Primary Care Fartun Paradiso: Abelino Derrick Other Clinician: Referring Antoin Dargis: Treating Jaquaveon Bilal/Extender: Sandi Raveling in Treatment: 29 Vital Signs Height(in): 66 Pulse(bpm): 67 Weight(lbs): 185 Blood Pressure(mmHg): 111/70 Body Mass Index(BMI): 29.9 Temperature(F): 98.1 Respiratory Rate(breaths/min): 20 [1:Photos:] [N/A:N/A] Left Elbow N/A N/A Wound Location: Pressure  Injury N/A N/A Wounding Event: Pressure Ulcer N/A N/A Primary Etiology: Cataracts, Arrhythmia, Coronary N/A N/A Comorbid History: Artery Disease, Hypotension, Osteoarthritis 12/19/2021 N/A N/A Date Acquired: 46 N/A N/A Weeks of Treatment: Open N/A N/A Wound Status: No N/A N/A Wound Recurrence: Yes N/A N/A Clustered Wound: 2x2x0.4 N/A N/A Measurements L x W x D (cm) 3.142 N/A N/A A (cm) : rea 1.257 N/A N/A Volume (cm) : 67.30% N/A N/A % Reduction in A rea: -30.70% N/A N/A % Reduction in Volume: Category/Stage III N/A N/A Classification: Medium N/A N/A Exudate A mount: Serosanguineous N/A N/A Exudate Type: red, brown N/A N/A  Exudate Color: Distinct, outline attached N/A N/A Wound Margin: Large (67-100%) N/A N/A Granulation A mount: [name] ([id number]) [id number] Page 3 of 7 Red, Hyper-granulation N/A N/A Granulation Quality: Small (1-33%) N/A N/A Necrotic Amount: Fat Layer (Subcutaneous Tissue): Yes N/A N/A Exposed Structures: Fascia: No Tendon: No Muscle: No Joint: No Bone: No Small (1-33%) N/A N/A Epithelialization: Debridement - Excisional N/A N/A Debridement: Pre-procedure Verification/Time Out 14:15 N/A N/A Taken: Lidocaine 5% topical ointment N/A N/A Pain Control: Subcutaneous, Slough N/A N/A Tissue Debrided: Skin/Subcutaneous Tissue N/A N/A Level: 4 N/A N/A Debridement A (sq cm): rea Curette N/A N/A Instrument: Minimum N/A N/A Bleeding: Pressure N/A N/A Hemostasis A chieved: 0 N/A N/A Procedural Pain: 0 N/A N/A Post Procedural Pain: Procedure was tolerated well N/A N/A Debridement Treatment Response: 2x2x0.4 N/A N/A Post Debridement Measurements L x W x D (cm) 1.257 N/A N/A Post Debridement Volume: (cm) Category/Stage III N/A N/A Post Debridement Stage: No Abnormalities Noted N/A N/A Periwound Skin Texture: No Abnormalities Noted N/A N/A Periwound Skin Moisture: No Abnormalities Noted  N/A N/A Periwound Skin Color: No Abnormality N/A N/A Temperature: Yes N/A N/A Tenderness on Palpation: Debridement N/A N/A Procedures Performed: Treatment Notes Electronic Signature(s) Signed: 09/05/2022 3:32:00 PM By: Fredirick Maudlin MD FACS Entered By: Fredirick Maudlin on 09/05/2022 15:32:00 -------------------------------------------------------------------------------- Multi-Disciplinary Care Plan Details Patient Name: Date of Service: GO RDO Delane Ginger, DA V ID M. 09/05/2022 2:00 PM Medical Record Number: [id number] Patient Account Number: [id number] Date of Birth/Sex: Treating RN: 1933/01/14 (89 y.o. [name] Primary Care Bernyce Brimley: Abelino Derrick Other Clinician: Referring Eleesha Purkey: Treating Rylynn Kobs/Extender: Sandi Raveling in Treatment: 29 Multidisciplinary Care Plan reviewed with physician Active Inactive Abuse / Safety / Falls / Self Care Management Nursing Diagnoses: Impaired physical mobility Potential for falls Goals: Patient/caregiver will verbalize understanding of skin care regimen Date Initiated: 02/14/2022 Target Resolution Date: 11/20/2022 Goal Status: Active Patient/caregiver will verbalize/demonstrate measures taken to prevent injury and/or falls Date Initiated: 02/14/2022 Target Resolution Date: 11/20/2022 Goal Status: Active Interventions: Assess fall risk on admission and as needed Assess self care needs on admission and as needed TYLER, CUBIT ([id number]) [id number] Page 4 of 7 Notes: Wound/Skin Impairment Nursing Diagnoses: Impaired tissue integrity Knowledge deficit related to ulceration/compromised skin integrity Goals: Patient/caregiver will verbalize understanding of skin care regimen Date Initiated: 02/14/2022 Target Resolution Date: 11/20/2022 Goal Status: Active Ulcer/skin breakdown will have a volume reduction of 30% by week 4 Date Initiated: 02/14/2022 Date Inactivated:  05/23/2022 Target Resolution Date: 05/05/2022 Goal Status: Unmet Unmet Reason: new PU left glut Interventions: Assess patient/caregiver ability to obtain necessary supplies Assess patient/caregiver ability to perform ulcer/skin care regimen upon admission and as needed Assess ulceration(s) every visit Treatment Activities: Skin care regimen initiated : 02/14/2022 Topical wound management initiated : 02/14/2022 Notes: Electronic Signature(s) Signed: 09/05/2022 4:21:13 PM By: Dellie Catholic RN Entered By: Dellie Catholic on 09/05/2022 15:59:44 -------------------------------------------------------------------------------- Pain Assessment Details Patient Name: Date of Service: [name] ID M. 09/05/2022 2:00 PM Medical Record Number: [id number] Patient Account Number: [id number] Date of Birth/Sex: Treating RN: 25-Sep-1932 (89 y.o. M) Primary Care Courtany Mcmurphy: Abelino Derrick Other Clinician: Referring Kemia Wendel: Treating Omarion Minnehan/Extender: Sandi Raveling in Treatment: 29 Active Problems Location of Pain Severity and Description of Pain Patient Has Paino Yes Site Locations Rate the pain. Current Pain Level: 5 Worst Pain Level: 10 Least Pain Level: 0 Tolerable Pain Level: 2 Pain Management and Medication Current Pain Management: ALCIDE, MEMOLI ([id number]) [phone].pdf Page 5 of 7  Electronic Signature(s) Signed: 09/05/2022 2:24:29 PM By: Worthy Rancher Entered By: Worthy Rancher on 09/05/2022 14:07:55 -------------------------------------------------------------------------------- Patient/Caregiver Education Details Patient Name: Date of Service: [name] ID M. 1/16/2024andnbsp2:00 PM Medical Record Number: [id number] Patient Account Number: [id number] Date of Birth/Gender: Treating RN: Aug 17, 1933 (89 y.o. [name] Primary Care Physician: Abelino Derrick Other Clinician: Referring Physician: Treating Physician/Extender:  Sandi Raveling in Treatment: 29 Education Assessment Education Provided To: Patient Education Topics Provided Wound/Skin Impairment: Methods: Explain/Verbal Responses: Return demonstration correctly Electronic Signature(s) Signed: 09/05/2022 4:21:13 PM By: Dellie Catholic RN Entered By: Dellie Catholic on 09/05/2022 16:00:03 -------------------------------------------------------------------------------- Wound Assessment Details Patient Name: Date of Service: [name] ID M. 09/05/2022 2:00 PM Medical Record Number: [id number] Patient Account Number: [id number] Date of Birth/Sex: Treating RN: June 21, 1933 (89 y.o. M) Primary Care Tamitha Norell: Abelino Derrick Other Clinician: Referring Jaley Yan: Treating Clive Parcel/Extender: Sandi Raveling in Treatment: 29 Wound Status Wound Number: 1 Primary Pressure Ulcer Etiology: Wound Location: Left Elbow Wound Status: Open Wounding Event: Pressure Injury Comorbid Cataracts, Arrhythmia, Coronary Artery Disease, Date Acquired: 12/19/2021 History: Hypotension, Osteoarthritis Weeks Of Treatment: 29 Clustered Wound: Yes Photos Casanova, Youlanda Roys ([id number]) [id number] Page 6 of 7 Wound Measurements Length: (cm) 2 Width: (cm) 2 Depth: (cm) 0.4 Area: (cm) 3.142 Volume: (cm) 1.257 % Reduction in Area: 67.3% % Reduction in Volume: -30.7% Epithelialization: Small (1-33%) Tunneling: No Undermining: No Wound Description Classification: Category/Stage III Wound Margin: Distinct, outline attached Exudate Amount: Medium Exudate Type: Serosanguineous Exudate Color: red, brown Foul Odor After Cleansing: No Slough/Fibrino Yes Wound Bed Granulation Amount: Large (67-100%) Exposed Structure Granulation Quality: Red, Hyper-granulation Fascia Exposed: No Necrotic Amount: Small (1-33%) Fat Layer (Subcutaneous Tissue) Exposed: Yes Necrotic Quality: Adherent Slough Tendon  Exposed: No Muscle Exposed: No Joint Exposed: No Bone Exposed: No Periwound Skin Texture Texture Color No Abnormalities Noted: Yes No Abnormalities Noted: Yes Moisture Temperature / Pain No Abnormalities Noted: Yes Temperature: No Abnormality Tenderness on Palpation: Yes Treatment Notes Wound #1 (Elbow) Wound Laterality: Left Cleanser Soap and Water Discharge Instruction: May shower and wash wound with dial antibacterial soap and water prior to dressing change. Wound Cleanser Discharge Instruction: Cleanse the wound with wound cleanser prior to applying a clean dressing using gauze sponges, not tissue or cotton balls. Peri-Wound Care Topical Primary Dressing Hydrofera Blue Ready Transfer Foam, 4x5 (in/in) Discharge Instruction: Apply to wound bed as instructed Secondary Dressing ALLEVYN Gentle Border, 5x5 (in/in) Discharge Instruction: Apply over primary dressing as directed. Secured With Compression Wrap Compression Stockings Add-Ons Soil scientist, 6 (in) Electronic Signature(s) [place], Mazi M ([id number]) [id number] Page 7 of 7 Signed: 09/05/2022 4:21:13 PM By: Dellie Catholic RN Previous Signature: 09/05/2022 2:24:29 PM Version By: Worthy Rancher Entered By: Dellie Catholic on 09/05/2022 14:29:15 -------------------------------------------------------------------------------- Vitals Details Patient Name: Date of Service: GO Eddie Dibbles, DA V ID M. 09/05/2022 2:00 PM Medical Record Number: [id number] Patient Account Number: [id number] Date of Birth/Sex: Treating RN: 08/08/1933 (89 y.o. M) Primary Care Felicitas Sine: Abelino Derrick Other Clinician: Referring Estephani Popper: Treating Sante Biedermann/Extender: Sandi Raveling in Treatment: 29 Vital Signs Time Taken: 02:05 Temperature (F): 98.1 Height (in): 66 Pulse (bpm): 86 Weight (lbs): 185 Respiratory Rate (breaths/min): 20 Body Mass Index (BMI): 29.9 Blood Pressure (mmHg):  111/70 Reference Range: 80 - 120 mg / dl Electronic Signature(s) Signed: 09/05/2022 2:24:29 PM By: Worthy Rancher Entered By: Worthy Rancher on 09/05/2022 14:07:39

## 2022-09-05 NOTE — Progress Notes (Signed)
Brian [name], Brian [name] ([id number]) [id number] Page 1 of 9 Visit Report for 09/05/2022 Chief Complaint Document Details Patient Name: Date of Service: Brian Brian [name] ID Brian [name] 09/05/2022 2:00 PM Medical Record Number: [id number] Patient Account Number: [id number] Date of Birth/Sex: Treating RN: [birth year] (89 y.o. Brian [name]) Primary Care Provider: Abelino Derrick Other Clinician: Referring Provider: Treating Provider/Extender: Sandi Raveling in Treatment: 29 Information Obtained from: Patient Chief Complaint 08/05/2019; patient is here for review of pressure ulcers on his buttock 02/14/22: patient is here for review of pressure ulcers on his left elbow Electronic Signature(s) Signed: 09/05/2022 3:32:11 PM By: Fredirick Maudlin MD FACS Entered By: Fredirick Maudlin on 09/05/2022 15:32:11 -------------------------------------------------------------------------------- Debridement Details Patient Name: Date of Service: Brian Brian [name], Brian Brian [name] 09/05/2022 2:00 PM Medical Record Number: [id number] Patient Account Number: [id number] Date of Birth/Sex: Treating RN: [birth year] (89 y.o. Brian Brian [name] Primary Care Provider: Abelino Derrick Other Clinician: Referring Provider: Treating Provider/Extender: Sandi Raveling in Treatment: 29 Debridement Performed for Assessment: Wound #1 Left Elbow Performed By: Physician Fredirick Maudlin, MD Debridement Type: Debridement Level of Consciousness (Pre-procedure): Awake and Alert Pre-procedure Verification/Time Out Yes - 14:15 Taken: Start Time: 14:15 Pain Control: Lidocaine 5% topical ointment T Area Debrided (L x W): otal 2 (cm) x 2 (cm) = 4 (cm) Tissue and other material debrided: Non-Viable, Slough, Subcutaneous, Slough Level: Skin/Subcutaneous Tissue Debridement Description: Excisional Instrument: Curette Bleeding: Minimum Hemostasis Achieved: Pressure End Time: 14:16 Procedural Pain:  0 Post Procedural Pain: 0 Response to Treatment: Procedure was tolerated well Level of Consciousness (Post- Awake and Alert procedure): Post Debridement Measurements of Total Wound Length: (cm) 2 Stage: Category/Stage III Width: (cm) 2 Depth: (cm) 0.4 Volume: (cm) 1.257 Character of Wound/Ulcer Post Debridement: Improved Brian Brian [name] ([id number]) [id number] Page 2 of 9 Post Procedure Diagnosis Same as Pre-procedure Notes Scribed for Dr. Celine Ahr by J.S. Electronic Signature(s) Signed: 09/05/2022 3:55:34 PM By: Fredirick Maudlin MD FACS Signed: 09/05/2022 4:21:13 PM By: Dellie Catholic RN Entered By: Dellie Catholic on 09/05/2022 14:54:46 -------------------------------------------------------------------------------- HPI Details Patient Name: Date of Service: Brian Brian [name], Brian Brian [name] 09/05/2022 2:00 PM Medical Record Number: [id number] Patient Account Number: [id number] Date of Birth/Sex: Treating RN: [birth year] (89 y.o. Brian [name]) Primary Care Provider: Abelino Derrick Other Clinician: Referring Provider: Treating Provider/Extender: Sandi Raveling in Treatment: 29 History of Present Illness HPI Description: ADMISSION 08/05/19 This is an 89 year old man who arrives in clinic accompanied by his son. 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 Apparently noted recently to have a stage I pressure area on the left buttock. He does not have a wound history. He does have been air fluidized mattress. They have a wheelchair cushion as well as a pillow over the top of this. He is not incontinent of urine or bowel. Past medical history includes hypertension, osteoarthritis of the knee, atrial fibrillation, history of mitral valve repair, right hemisphere CVA with left hemiparesis, history of a DVT READMISSION 02/14/2022 The patient is here today for evaluation of pressure ulcers on his left elbow and possible  right buttock ulcer. Apparently when he was seen by Dr. Dellia Nims in 2020, no ulcer was appreciated on the buttock and no further wound care involvement occurred. 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 He does have home health aides and  they are turning him every 2 hours side to side. He is on a memory foam mattress at this time. 03/14/2022: The patient was scheduled to see me on July 11, but apparently his son panicked about some bright red blood per rectum and rushed him to the emergency room. There is also a comment in the electronic medical record that the patient's son thought the bone was exposed on his elbow. After waiting a prolonged period of time at the emergency department, they left without being seen. Today, it appears that the dressing on the patient's elbow was never changed since our last visit. The 2 wounds have converged creating a larger wound. According to the intake nurse, there was a foul odor from the wound and dressing. There is extensive slough on the wound surface. The bleeding per rectum turns out to be hemorrhoids. The patient's son expresses that he does not know how to manage this. 04/07/2022: For unclear reasons, the patient has not returned to clinic until they were contacted today and they made an add-on visit. The patient's son was not originally present at the beginning of the visit but showed up about 25 minutes into the visit. The patient was accompanied by a home aide. She reported that when she cleaned the patient up today, there was just a Band-Aid on his wound. She said the surface was fairly mucky but she was able to wash this off. It is abundantly clear that this wound is not being properly cared for. Apparently he did have [facility] home health but they have not been out in some time and it is not clear the reason for this. The surface of  the wound is a bit cleaner so perhaps the Santyl has been applied, but unfortunately there is now a deep tunnel that extends up from the elbow to the patient's posterior upper arm for a number centimeters. There is no evidence of infection or purulent drainage. It is also clear that the patient continues to spend a substantial portion of his time leaning on the elbow. 05/23/2022: Once again, the patient has failed to return to clinic for about 6 weeks and the reason remains unclear. He is apparently receiving home health assistance. The wound on his elbow is covered with hypertrophic granulation tissue. He also has a blister just distal to the wound on his posterior forearm. He has a new stage III pressure ulcer on his left gluteus with slough accumulation. He apparently spends a substantial portion of his days sitting in a wheelchair. I do not believe he has a Financial trader. Due to his stroke, he lists to the left side, which is how his ulcer on his elbow developed. I suspect the gluteal ulcer is as a result of this, as well. 06/13/2022: The wound on his left gluteus is nearly healed. It is very superficial and quite clean. His son reports that he has just been applying Desitin to the area. The elbow wound is smaller but still has some hypertrophic granulation tissue and slough accumulation. No concern for infection at either site. 06/27/2022: The buttock wounds are healed. There is some tissue maceration without any significant skin breakdown. The elbow wound continues to contract. There is still some hypertrophic granulation tissue and slough buildup. 07/25/2022: The buttock wounds have reopened. The periwound skin has been kept very dry through liberal use of zinc oxide, however. The elbow wound dressing did not look like it had been changed in at least a week when it was removed. The  underlying wound, however has not reaccumulated any hypertrophic granulation tissue; there is a thin layer of slough  present. No malodor or purulent drainage from the wound itself. 08/15/2022: There is a tiny opening remaining on the buttocks. It is clean and there is no evidence of tissue maceration. The elbow looks very good. There is good beefy tissue present and it is flush with the surrounding skin. Home health has been coming out. 09/05/2022: The wound on his bottom is healed. The elbow shows signs of pressure induced deep tissue injury and also has some hypertrophic granulation Brian Brian [name] ([id number]) [id number] Page 3 of 9 tissue. Electronic Signature(s) Signed: 09/05/2022 3:33:21 PM By: Fredirick Maudlin MD FACS Entered By: Fredirick Maudlin on 09/05/2022 15:33:21 -------------------------------------------------------------------------------- Physical Exam Details Patient Name: Date of Service: Brian Brian Brian [name] ID Brian [name] 09/05/2022 2:00 PM Medical Record Number: [id number] Patient Account Number: [id number] Date of Birth/Sex: Treating RN: May 03, 1933 (89 y.o. Brian [name]) Primary Care Provider: Abelino Derrick Other Clinician: Referring Provider: Treating Provider/Extender: Sandi Raveling in Treatment: 29 Constitutional . . . . no acute distress. Respiratory Normal work of breathing on room air. Notes 09/05/2022: The wound on his bottom is healed. The elbow shows signs of pressure induced deep tissue injury and also has some hypertrophic granulation tissue. Electronic Signature(s) Signed: 09/05/2022 3:37:08 PM By: Fredirick Maudlin MD FACS Entered By: Fredirick Maudlin on 09/05/2022 15:37:08 -------------------------------------------------------------------------------- Physician Orders Details Patient Name: Date of Service: Brian Brian [name], Brian Brian [name] 09/05/2022 2:00 PM Medical Record Number: [id number] Patient Account Number: [id number] Date of Birth/Sex: Treating RN: Sep 25, 1932 (89 y.o. Brian Brian [name] Primary Care Provider: Abelino Derrick Other  Clinician: Referring Provider: Treating Provider/Extender: Sandi Raveling in Treatment: 29 Verbal / Phone Orders: No Diagnosis Coding ICD-10 Coding Code Description L89.023 Pressure ulcer of left elbow, stage 3 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 - Dr. Celine Ahr Room 3 Bathing/ Shower/ Hygiene May shower and wash wound with soap and water. - with dressing changes Off-Loading Turn and reposition every 2 hours - stay off of left elbow wear prevalon boot on elbow as directed ANFERNEE, PESCHKE ([id number]) [id number] Page 4 of 9 Other: - limit time in wheelchair, reposition in wheelchair every 1-2 hours Home Health No change in wound care orders this week; continue Home Health for wound care. May utilize formulary equivalent dressing for wound treatment orders unless otherwise specified. Dressing changes to be completed by [facility] on Monday / Wednesday / Friday except when patient has scheduled visit at [facility]. Other Home Health Orders/Instructions: - [facility] Wound Treatment Wound #1 - Elbow Wound Laterality: Left Cleanser: Soap and Water Every Other Day/30 Days Discharge Instructions: May shower and wash wound with dial antibacterial soap and water prior to dressing change. Cleanser: Wound Cleanser (Generic) Every Other Day/30 Days Discharge Instructions: Cleanse the wound with wound cleanser prior to applying a clean dressing using gauze sponges, not tissue or cotton balls. Prim Dressing: Hydrofera Blue Ready Transfer Foam, 4x5 (in/in) Every Other Day/30 Days ary Discharge Instructions: Apply to wound bed as instructed Secondary Dressing: ALLEVYN Gentle Border, 5x5 (in/in) (Generic) Every Other  Day/30 Days Discharge Instructions: Apply over primary dressing as directed. Add-Ons: Cotton Tip Applicator, 6 (in) (Generic) Every Other Day/30 Days Electronic Signature(s) Signed: 09/05/2022 3:55:34 PM By: Fredirick Maudlin MD FACS  Entered By: Fredirick Maudlin on 09/05/2022 15:37:21 -------------------------------------------------------------------------------- Problem List Details Patient Name: Date of Service: Brian Brian [name] ID Brian [name] 09/05/2022 2:00 PM Medical Record Number: [id number] Patient Account Number: [id number] Date of Birth/Sex: Treating RN: 03/10/1933 (89 y.o. Brian [name]) Primary Care Provider: Abelino Derrick Other Clinician: Referring Provider: Treating Provider/Extender: Sandi Raveling in Treatment: 29 Active Problems ICD-10 Encounter Code Description Active Date MDM Diagnosis L89.023 Pressure ulcer of left elbow, stage 3 02/14/2022 No Yes 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 02/14/2022 No Yes I48.19 Other persistent atrial fibrillation 02/14/2022 No Yes I25.10 Atherosclerotic heart disease of native coronary artery without angina pectoris 02/14/2022 No Yes Inactive Problems ICD-10 Code Description Active Date Inactive Date L89.323 Pressure ulcer of left buttock, stage 3 05/23/2022 05/23/2022 Brian Brian [name] ([id number]) [phone].pdf Page 5 of 9 Resolved Problems Electronic Signature(s) Signed: 09/05/2022 3:30:05 PM By: Fredirick Maudlin MD FACS Entered By: Fredirick Maudlin on 09/05/2022 15:30:05 -------------------------------------------------------------------------------- Progress Note Details Patient Name: Date of Service: Brian Brian [name], Brian Brian [name] 09/05/2022 2:00 PM Medical Record Number: [id number] Patient Account Number: [id number] Date of Birth/Sex: Treating RN: 1933-06-11 (89 y.o. Brian [name]) Primary Care Provider: Abelino Derrick Other Clinician: Referring Provider: Treating Provider/Extender:  Sandi Raveling in Treatment: 29 Subjective Chief Complaint Information obtained from Patient 08/05/2019; patient is here for review of pressure ulcers on his buttock 02/14/22: patient is here for review of pressure ulcers on his left elbow History of Present Illness (HPI) ADMISSION 08/05/19 This is an 89 year old man who arrives in clinic accompanied by his son. 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 Apparently noted recently to have a stage I pressure area on the left buttock. He does not have a wound history. He does have been air fluidized mattress. They have a wheelchair cushion as well as a pillow over the top of this. He is not incontinent of urine or bowel. Past medical history includes hypertension, osteoarthritis of the knee, atrial fibrillation, history of mitral valve repair, right hemisphere CVA with left hemiparesis, history of a DVT READMISSION 02/14/2022 The patient is here today for evaluation of pressure ulcers on his left elbow and possible right buttock ulcer. Apparently when he was seen by Dr. Dellia Nims in 2020, no ulcer was appreciated on the buttock and no further wound care involvement occurred. 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 He does have home health aides and they are turning him every 2 hours side to side. He is on a memory foam mattress at this time. 03/14/2022: The patient was scheduled to see me on July 11, but apparently his son panicked about some bright red blood per rectum and rushed him to the emergency room. There is also a comment in the electronic medical record that the patient's son thought the bone was exposed on his elbow. After waiting a prolonged period of time at the emergency department, they left without being seen. Today, it appears that the dressing on  the patient's elbow was never changed since our last visit. The 2 wounds have converged creating a larger wound. According to the intake nurse, there was a foul odor from the wound and dressing. There is extensive slough on the wound surface. The bleeding per rectum turns out to be hemorrhoids. The patient's son expresses that he  does not know how to manage this. 04/07/2022: For unclear reasons, the patient has not returned to clinic until they were contacted today and they made an add-on visit. The patient's son was not originally present at the beginning of the visit but showed up about 25 minutes into the visit. The patient was accompanied by a home aide. She reported that when she cleaned the patient up today, there was just a Band-Aid on his wound. She said the surface was fairly mucky but she was able to wash this off. It is abundantly clear that this wound is not being properly cared for. Apparently he did have [facility] home health but they have not been out in some time and it is not clear the reason for this. The surface of the wound is a bit cleaner so perhaps the Santyl has been applied, but unfortunately there is now a deep tunnel that extends up from the elbow to the patient's posterior upper arm for a number centimeters. There is no evidence of infection or purulent drainage. It is also clear that the patient continues to spend a substantial portion of his time leaning on the elbow. 05/23/2022: Once again, the patient has failed to return to clinic for about 6 weeks and the reason remains unclear. He is apparently receiving home health assistance. The wound on his elbow is covered with hypertrophic granulation tissue. He also has a blister just distal to the wound on his posterior forearm. He has a new stage III pressure ulcer on his left gluteus with slough accumulation. He apparently spends a substantial portion of his days sitting in a wheelchair. I do not believe he has a Financial trader.  Due to his stroke, he lists to the left side, which is how his ulcer on his elbow developed. I suspect the gluteal ulcer is as a result of this, as well. 06/13/2022: The wound on his left gluteus is nearly healed. It is very superficial and quite clean. His son reports that he has just been applying Desitin to the area. The elbow wound is smaller but still has some hypertrophic granulation tissue and slough accumulation. No concern for infection at either site. 06/27/2022: The buttock wounds are healed. There is some tissue maceration without any significant skin breakdown. The elbow wound continues to contract. There is still some hypertrophic granulation tissue and slough buildup. 07/25/2022: The buttock wounds have reopened. The periwound skin has been kept very dry through liberal use of zinc oxide, however. The elbow wound dressing did not look like it had been changed in at least a week when it was removed. The underlying wound, however has not reaccumulated any hypertrophic granulation tissue; there is a thin layer of slough present. No malodor or purulent drainage from the wound itself. 08/15/2022: There is a tiny opening remaining on the buttocks. It is clean and there is no evidence of tissue maceration. The elbow looks very good. There is good beefy tissue present and it is flush with the surrounding skin. Home health has been coming out. Brian Brian [name], Brian Brian [name] ([id number]) [id number] Page 6 of 9 09/05/2022: The wound on his bottom is healed. The elbow shows signs of pressure induced deep tissue injury and also has some hypertrophic granulation tissue. Patient History Information obtained from Patient. Family History Diabetes - Maternal Grandparents, Heart Disease - Mother, Hypertension - Mother, No family history of Cancer, Hereditary Spherocytosis, Kidney Disease, Lung Disease, Seizures, Stroke, Thyroid Problems, Tuberculosis. Social History Never smoker, Marital  Status - Married, Alcohol  Use - Never, Drug Use - No History, Caffeine Use - Never. Medical History Eyes Patient has history of Cataracts - removed Cardiovascular Patient has history of Arrhythmia - Atrial Flutter/Atrial Fib, Coronary Artery Disease, Hypotension Denies history of Hypertension Musculoskeletal Patient has history of Osteoarthritis Neurologic Denies history of Paraplegia Hospitalization/Surgery History - open heart surgery. - IR generic histoircal. - appendectomy. - hernia repair. - joint replacement. - mitral valve repair. - mvp repair. - total hip arthroplasty. Medical A Surgical History Notes nd Constitutional Symptoms (General Health) obseity Ear/Nose/Mouth/Throat wears hearing aids Cardiovascular Mitral Valve Repair, Genitourinary BPH Neurologic CVA 2018, left side hemiparesis Objective Constitutional no acute distress. Vitals Time Taken: 2:05 AM, Height: 66 in, Weight: 185 lbs, BMI: 29.9, Temperature: 98.1 F, Pulse: 86 bpm, Respiratory Rate: 20 breaths/min, Blood Pressure: 111/70 mmHg. Respiratory Normal work of breathing on room air. General Notes: 09/05/2022: The wound on his bottom is healed. The elbow shows signs of pressure induced deep tissue injury and also has some hypertrophic granulation tissue. Integumentary (Hair, Skin) Wound #1 status is Open. Original cause of wound was Pressure Injury. The date acquired was: 12/19/2021. The wound has been in treatment 29 weeks. The wound is located on the Left Elbow. The wound measures 2cm length x 2cm width x 0.4cm depth; 3.142cm^2 area and 1.257cm^3 volume. There is Fat Layer (Subcutaneous Tissue) exposed. There is no tunneling or undermining noted. There is a medium amount of serosanguineous drainage noted. The wound margin is distinct with the outline attached to the wound base. There is large (67-100%) red, hyper - granulation within the wound bed. There is a small (1-33%) amount of necrotic tissue within  the wound bed including Adherent Slough. The periwound skin appearance had no abnormalities noted for texture. The periwound skin appearance had no abnormalities noted for moisture. The periwound skin appearance had no abnormalities noted for color. Periwound temperature was noted as No Abnormality. The periwound has tenderness on palpation. Assessment Active Problems ICD-10 Pressure ulcer of left elbow, stage 3 Cerebral infarction due to thrombosis of unspecified precerebral artery Other persistent atrial fibrillation Atherosclerotic heart disease of native coronary artery without angina pectoris Brian Brian [name] ([id number]) [id number] Page 7 of 9 Procedures Wound #1 Pre-procedure diagnosis of Wound #1 is a Pressure Ulcer located on the Left Elbow . There was a Excisional Skin/Subcutaneous Tissue Debridement with a total area of 4 sq cm performed by Fredirick Maudlin, MD. With the following instrument(s): Curette to remove Non-Viable tissue/material. Material removed includes Subcutaneous Tissue and Slough and after achieving pain control using Lidocaine 5% topical ointment. No specimens were taken. A time out was conducted at 14:15, prior to the start of the procedure. A Minimum amount of bleeding was controlled with Pressure. The procedure was tolerated well with a pain level of 0 throughout and a pain level of 0 following the procedure. Post Debridement Measurements: 2cm length x 2cm width x 0.4cm depth; 1.257cm^3 volume. Post debridement Stage noted as Category/Stage III. Character of Wound/Ulcer Post Debridement is improved. Post procedure Diagnosis Wound #1: Same as Pre-Procedure General Notes: Scribed for Dr. Celine Ahr by Lenna Sciara.S.. Plan Follow-up Appointments: Return appointment in 3 weeks. - Dr. Celine Ahr Room 3 Bathing/ Shower/ Hygiene: May shower and wash wound with soap and water. - with dressing changes Off-Loading: Turn and reposition every 2 hours - stay  off of left elbow wear prevalon boot on elbow as directed Other: - limit time in wheelchair, reposition in wheelchair every 1-2 hours Home Health: No change in wound  care orders this week; continue Home Health for wound care. May utilize formulary equivalent dressing for wound treatment orders unless otherwise specified. Dressing changes to be completed by [facility] [place] on Monday / Wednesday / Friday except when patient has scheduled visit at [facility]. Other Home Health Orders/Instructions: - [facility] WOUND #1: - Elbow Wound Laterality: Left Cleanser: Soap and Water Every Other Day/30 Days Discharge Instructions: May shower and wash wound with dial antibacterial soap and water prior to dressing change. Cleanser: Wound Cleanser (Generic) Every Other Day/30 Days Discharge Instructions: Cleanse the wound with wound cleanser prior to applying a clean dressing using gauze sponges, not tissue or cotton balls. Prim Dressing: Hydrofera Blue Ready Transfer Foam, 4x5 (in/in) Every Other Day/30 Days ary Discharge Instructions: Apply to wound bed as instructed Secondary Dressing: ALLEVYN Gentle Border, 5x5 (in/in) (Generic) Every Other Day/30 Days Discharge Instructions: Apply over primary dressing as directed. Add-Ons: Cotton Tip Applicator, 6 (in) (Generic) Every Other Day/30 Days 09/05/2022: The wound on his bottom is healed. The elbow shows signs of pressure induced deep tissue injury and also has some hypertrophic granulation tissue. I used a curette to debride the damaged nonviable subcutaneous tissue from his elbow. I then chemically cauterized the hypertrophic granulation tissue with silver nitrate. I reminded him of the importance of not leaning on that elbow, which I know is somewhat difficult for him in light of his stroke. I am going to change his dressing to [facility] We will continue to use a heel cup and Prevalon boot on the elbow as additional padding to the site. Follow-up in  3 weeks. Electronic Signature(s) Signed: 09/05/2022 3:38:40 PM By: Fredirick Maudlin MD FACS Entered By: Fredirick Maudlin on 09/05/2022 15:38:39 -------------------------------------------------------------------------------- HxROS Details Patient Name: Date of Service: Brian Brian Brian [name], Brian Brian [name] 09/05/2022 2:00 PM Medical Record Number: [id number] Patient Account Number: [id number] Date of Birth/Sex: Treating RN: 04/10/1933 (89 y.o. Brian [name]) Primary Care Provider: Abelino Derrick Other Clinician: Referring Provider: Treating Provider/Extender: Sandi Raveling in Treatment: [address] Information Obtained From Patient Brian Brian [name], Brian Brian [name] ([id number]) [id number] Page 8 of 9 Constitutional Symptoms (General Health) Medical History: Past Medical History Notes: obseity Eyes Medical History: Positive for: Cataracts - removed Ear/Nose/Mouth/Throat Medical History: Past Medical History Notes: wears hearing aids Cardiovascular Medical History: Positive for: Arrhythmia - Atrial Flutter/Atrial Fib; Coronary Artery Disease; Hypotension Negative for: Hypertension Past Medical History Notes: Mitral Valve Repair, Genitourinary Medical History: Past Medical History Notes: BPH Musculoskeletal Medical History: Positive for: Osteoarthritis Neurologic Medical History: Negative for: Paraplegia Past Medical History Notes: CVA 2018, left side hemiparesis HBO Extended History Items Eyes: Cataracts Immunizations Pneumococcal Vaccine: Received Pneumococcal Vaccination: Yes Received Pneumococcal Vaccination On or After 60th Birthday: Yes Implantable Devices None Hospitalization / Surgery History Type of Hospitalization/Surgery open heart surgery IR generic histoircal appendectomy hernia repair joint replacement mitral valve repair mvp repair total hip arthroplasty Family and Social History Cancer: No; Diabetes: Yes - Maternal Grandparents; Heart  Disease: Yes - Mother; Hereditary Spherocytosis: No; Hypertension: Yes - Mother; Kidney Disease: No; Lung Disease: No; Seizures: No; Stroke: No; Thyroid Problems: No; Tuberculosis: No; Never smoker; Marital Status - Married; Alcohol Use: Never; Drug Use: No History; Caffeine Use: Never; Financial Concerns: No; Food, Clothing or Shelter Needs: No; Support System Lacking: No; Transportation Concerns: No Electronic Signature(s) Signed: 09/05/2022 3:55:34 PM By: Fredirick Maudlin MD FACS Entered By: Fredirick Maudlin on 09/05/2022 15:33:26 Brian Brian [name], Brian Brian [name] ([id number]) [id number] Page 9 of 9 -------------------------------------------------------------------------------- SuperBill Details Patient Name: Date of  Service: Brian Brian [name] ID Brian [name] 09/05/2022 Medical Record Number: [id number] Patient Account Number: [id number] Date of Birth/Sex: Treating RN: [birth year] (89 y.o. 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 I48.19 Other persistent atrial fibrillation I25.10 Atherosclerotic heart disease of native coronary artery without angina pectoris Facility Procedures : CPT4 Code: [id number] Description: 58592 - DEB SUBQ TISSUE 20 SQ CM/< ICD-10 Diagnosis Description L89.023 Pressure ulcer of left elbow, stage 3 Modifier: Quantity: 1 Physician Procedures : CPT4 Code Description Modifier [id number] 63817 - WC PHYS LEVEL 3 - EST PT 25 ICD-10 Diagnosis Description L89.023 Pressure ulcer of left elbow, stage 3 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

## 2022-09-06 DIAGNOSIS — L89322 Pressure ulcer of left buttock, stage 2: Secondary | ICD-10-CM | POA: Diagnosis not present

## 2022-09-06 DIAGNOSIS — L89023 Pressure ulcer of left elbow, stage 3: Secondary | ICD-10-CM | POA: Diagnosis not present

## 2022-09-06 DIAGNOSIS — Z7901 Long term (current) use of anticoagulants: Secondary | ICD-10-CM | POA: Diagnosis not present

## 2022-09-06 DIAGNOSIS — I4819 Other persistent atrial fibrillation: Secondary | ICD-10-CM | POA: Diagnosis not present

## 2022-09-06 DIAGNOSIS — I69354 Hemiplegia and hemiparesis following cerebral infarction affecting left non-dominant side: Secondary | ICD-10-CM | POA: Diagnosis not present

## 2022-09-06 DIAGNOSIS — I2510 Atherosclerotic heart disease of native coronary artery without angina pectoris: Secondary | ICD-10-CM | POA: Diagnosis not present

## 2022-09-06 NOTE — Telephone Encounter (Signed)
Verbal orders given, written orders will be faxed over.

## 2022-09-08 DIAGNOSIS — I2510 Atherosclerotic heart disease of native coronary artery without angina pectoris: Secondary | ICD-10-CM | POA: Diagnosis not present

## 2022-09-08 DIAGNOSIS — L89322 Pressure ulcer of left buttock, stage 2: Secondary | ICD-10-CM | POA: Diagnosis not present

## 2022-09-08 DIAGNOSIS — L89023 Pressure ulcer of left elbow, stage 3: Secondary | ICD-10-CM | POA: Diagnosis not present

## 2022-09-08 DIAGNOSIS — Z7901 Long term (current) use of anticoagulants: Secondary | ICD-10-CM | POA: Diagnosis not present

## 2022-09-08 DIAGNOSIS — I4819 Other persistent atrial fibrillation: Secondary | ICD-10-CM | POA: Diagnosis not present

## 2022-09-08 DIAGNOSIS — I69354 Hemiplegia and hemiparesis following cerebral infarction affecting left non-dominant side: Secondary | ICD-10-CM | POA: Diagnosis not present

## 2022-09-12 ENCOUNTER — Ambulatory Visit
Payer: Medicare Other | Attending: Student in an Organized Health Care Education/Training Program | Admitting: Student in an Organized Health Care Education/Training Program

## 2022-09-12 ENCOUNTER — Telehealth: Payer: Self-pay

## 2022-09-12 ENCOUNTER — Encounter: Payer: Self-pay | Admitting: Student in an Organized Health Care Education/Training Program

## 2022-09-12 VITALS — BP 122/62 | HR 53 | Temp 97.4°F | Resp 15 | Ht 66.0 in | Wt 185.0 lb

## 2022-09-12 DIAGNOSIS — M5136 Other intervertebral disc degeneration, lumbar region: Secondary | ICD-10-CM | POA: Insufficient documentation

## 2022-09-12 DIAGNOSIS — M5416 Radiculopathy, lumbar region: Secondary | ICD-10-CM | POA: Diagnosis not present

## 2022-09-12 DIAGNOSIS — G8929 Other chronic pain: Secondary | ICD-10-CM | POA: Insufficient documentation

## 2022-09-12 DIAGNOSIS — M48062 Spinal stenosis, lumbar region with neurogenic claudication: Secondary | ICD-10-CM | POA: Diagnosis not present

## 2022-09-12 DIAGNOSIS — G894 Chronic pain syndrome: Secondary | ICD-10-CM | POA: Diagnosis not present

## 2022-09-12 DIAGNOSIS — M47816 Spondylosis without myelopathy or radiculopathy, lumbar region: Secondary | ICD-10-CM | POA: Diagnosis not present

## 2022-09-12 DIAGNOSIS — M170 Bilateral primary osteoarthritis of knee: Secondary | ICD-10-CM | POA: Insufficient documentation

## 2022-09-12 MED ORDER — OXYCODONE-ACETAMINOPHEN 7.5-325 MG PO TABS: 1.0000 | ORAL_TABLET | Freq: Two times a day (BID) | ORAL | 0 refills | Status: DC | PRN

## 2022-09-12 MED ORDER — PREGABALIN 25 MG PO CAPS: 25.0000 mg | ORAL_CAPSULE | Freq: Every day | ORAL | 5 refills | Status: DC

## 2022-09-12 MED ORDER — OXYCODONE-ACETAMINOPHEN 7.5-325 MG PO TABS: 1.0000 | ORAL_TABLET | Freq: Two times a day (BID) | ORAL | 0 refills | Status: AC | PRN

## 2022-09-12 NOTE — Progress Notes (Signed)
Nursing Pain Medication Assessment:  Safety precautions to be maintained throughout the outpatient stay will include: orient to surroundings, keep bed in low position, maintain call bell within reach at all times, provide assistance with transfer out of bed and ambulation.  Medication Inspection Compliance: Brian [name] did not comply with our request to bring his pills to be counted. He was reminded that bringing the medication bottles, even when empty, is a requirement.  Medication: None brought in. Pill/Patch Count: None available to be counted. Bottle Appearance: No container available. Did not bring bottle(s) to appointment. Filled Date: N/A Last Medication intake:  Today

## 2022-09-12 NOTE — Progress Notes (Signed)
PROVIDER NOTE: Information contained herein reflects review and annotations entered in association with encounter. Interpretation of such information and data should be left to medically-trained personnel. Information provided to patient can be located elsewhere in the medical record under "Patient Instructions". Document created using STT-dictation technology, any transcriptional errors that may result from process are unintentional.    Patient: Brian [name]  Service Category: E/M  Provider: Gillis Santa, MD  DOB: 1932-12-29  DOS: 09/12/2022  Referring Provider: Libby Maw  MRN: [id number]  Specialty: Interventional Pain Management  PCP: Libby Maw, MD  Type: Established Patient  Setting: Ambulatory outpatient    Location: Office  Delivery: Face-to-face     HPI  Brian [name], a 89 y.o. year old male, is here today because of his Chronic pain syndrome [G89.4]. Brian [name] primary complain today is Hip Pain (left), Shoulder Pain (left), and Knee Pain (left) Last encounter: My last encounter with him was on 06/06/22 Pertinent problems: Brian [name] has Dysarthria, post-stroke; Chronic radicular lumbar pain; Gait disturbance; Paraplegic immobility syndrome; History of cerebrovascular accident; Depression with anxiety; Lumbar spondylosis; Lumbar degenerative disc disease; Bilateral primary osteoarthritis of knee; Pain management contract signed; and Chronic pain syndrome on their pertinent problem list. Pain Assessment: Severity of Chronic pain is reported as a 5 /10. Location: Hip Left/denies. Onset:  . Quality:  . Timing:  . Modifying factor(s):  Marland Kitchen Vitals:  height is '5\' 6"'$  (1.676 m) and weight is 185 lb (83.9 kg). His temporal temperature is 97.4 F (36.3 C) (abnormal). His blood pressure is 122/62 and his pulse is 53 (abnormal). His respiration is 15 and oxygen saturation is 98%.   Reason for encounter: medication management.   No significant change in Brian [name]  history. Continues to endorse analgesic and functional benefit with Percocet that he takes once in the morning and once in the evening after a meal along with Lyrica 25 mg which he takes at night.  No cognitive side effects, gait disturbances, GI side effects.  We will continue as prescribed.  Patient's son in room.  04/11/22: 2nd pt visit No significant change in his medical history.  Patient unable to submit a urine toxicology screen so we obtained a serum drug screen. We will have patient sign pain contract.  He was accompanied today by his son and their CMA Prescription for Percocet as below 7.5 mg twice daily as needed which was being managed by their primary care provider, Dr. Ethelene Hal Also recommend restarting Lyrica which the patient was on before, 25 mg nightly as a pain adjunct.   HPI from initial clinic visit: Brian [name] is a pleasant 89 year old male who is accompanied today by his son and his nursing aide.  He has a longstanding history of diffuse arthritis most pronounced in his glenohumeral joints and knees.  He has tried physical therapy and continues to engage in participate in physical therapy twice a week.  He has a history of stroke with associated hemiparesis.  He has significant thoracic kyphosis.  He has difficulty ambulating and presents today in a wheelchair.  He also has bilateral hip osteoarthritis.  He was previously on Percocet 5 mg twice a day which became less effective and as a result, his primary care provider increased it to 7.5 mg twice a day which he is not getting any benefit from.  He is currently anticoagulated on Eliquis for his coronary artery disease.  He is also on Lyrica 25 mg twice a day.  He  is also on Seroquel and Zoloft for depression management.    Pharmacotherapy Assessment  Analgesic:  08/18/2022 06/06/2022  1 Oxycodone-Acetaminophn 7.5-325 60.00 30 Bi Lat [id number] [facility] (8425) 0/0 22.50 MME Medicare [place]  08/17/2022 04/11/2022  1 Pregabalin 25 Mg Capsule  30.00 30 Bi Lat [id number] [facility] (8425) 2/2 0.17 LME Medicare [place]    Monitoring: [place] PMP: PDMP reviewed during this encounter.       Pharmacotherapy: No side-effects or adverse reactions reported. Compliance: No problems identified. Effectiveness: Clinically acceptable.  Rise Patience, RN  09/12/2022  3:28 PM  Sign when Signing Visit Nursing Pain Medication Assessment:  Safety precautions to be maintained throughout the outpatient stay will include: orient to surroundings, keep bed in low position, maintain call bell within reach at all times, provide assistance with transfer out of bed and ambulation.  Medication Inspection Compliance: Mr. Mccollam did not comply with our request to bring his pills to be counted. He was reminded that bringing the medication bottles, even when empty, is a requirement.  Medication: None brought in. Pill/Patch Count: None available to be counted. Bottle Appearance: No container available. Did not bring bottle(s) to appointment. Filled Date: N/A Last Medication intake:  Today    No results found for: "CBDTHCR" No results found for: "D8THCCBX" No results found for: "D9THCCBX"  UDS:  Summary  Date Value Ref Range Status  12/27/2021 Note  Final    Comment:    ==================================================================== Compliance Drug Analysis, Ur ==================================================================== Test                             Result       Flag       Units  Drug Present and Declared for Prescription Verification   Oxycodone                      2038         EXPECTED   ng/mg creat   Oxymorphone                    6930         EXPECTED   ng/mg creat   Noroxycodone                   746          EXPECTED   ng/mg creat   Noroxymorphone                 488          EXPECTED   ng/mg creat    Sources of oxycodone are scheduled prescription medications.    Oxymorphone, noroxycodone, and noroxymorphone are expected    metabolites of  oxycodone. Oxymorphone is also available as a    scheduled prescription medication.    Pregabalin                     PRESENT      EXPECTED   Sertraline                     PRESENT      EXPECTED   Desmethylsertraline            PRESENT      EXPECTED    Desmethylsertraline is an expected metabolite of sertraline.    Quetiapine  PRESENT      EXPECTED   Acetaminophen                  PRESENT      EXPECTED  Drug Absent but Declared for Prescription Verification   Alprazolam                     Not Detected UNEXPECTED ng/mg creat   Metoprolol                     Not Detected UNEXPECTED ==================================================================== Test                      Result    Flag   Units      Ref Range   Creatinine              100              mg/dL      >=20 ==================================================================== Declared Medications:  The flagging and interpretation on this report are based on the  following declared medications.  Unexpected results may arise from  inaccuracies in the declared medications.   **Note: The testing scope of this panel includes these medications:   Alprazolam (Xanax)  Metoprolol  Oxycodone (Percocet)  Pregabalin (Lyrica)  Quetiapine (Seroquel)  Sertraline (Zoloft)   **Note: The testing scope of this panel does not include small to  moderate amounts of these reported medications:   Acetaminophen (Tylenol)  Acetaminophen (Percocet)   **Note: The testing scope of this panel does not include the  following reported medications:   Apixaban (Eliquis)  Cetirizine (Zyrtec)  Finasteride (Proscar)  Losartan (Cozaar)  Multivitamin  Pantoprazole (Protonix)  Polyethylene Glycol (MiraLAX)  Pravastatin  Tamsulosin (Flomax) ==================================================================== For clinical consultation, please call [phone]. ====================================================================       ROS  Constitutional: Denies any fever or chills Gastrointestinal: No reported hemesis, hematochezia, vomiting, or acute GI distress Musculoskeletal:  Diffuse musculoskeletal pain Neurological:  Dysarthria and dysphagia from previous stroke  Medication Review  ALPRAZolam, Mupirocin, QUEtiapine, apixaban, cetirizine, finasteride, fluticasone, hydrocortisone ointment, melatonin, metoprolol succinate, multivitamin, oxyCODONE-acetaminophen, pantoprazole, polyethylene glycol powder, pravastatin, pregabalin, sertraline, and tamsulosin  History Review  Allergy: Brian [name] is allergic to novocain [procaine hcl], other, and penicillins. Drug: Brian [name]  reports no history of drug use. Alcohol:  reports no history of alcohol use. Tobacco:  reports that he has quit smoking. He has never used smokeless tobacco. Social: Brian [name]  reports that he has quit smoking. He has never used smokeless tobacco. He reports that he does not drink alcohol and does not use drugs. Medical:  has a past medical history of Anemia, Atrial flutter ([place]), BPH (benign prostatic hypertrophy), Epistaxis, Hemorrhage of gastrointestinal tract, unspecified, History of open heart surgery, Hypertension, Obesity (BMI 30.0-34.9) (06/29/2020), Osteoarthrosis, unspecified whether generalized or localized, unspecified site, Personal history of venous thrombosis and embolism, Rosacea, Stroke ([place]), and TIA (transient ischemic attack). Surgical: Brian [name]  has a past surgical history that includes Appendectomy; Hernia repair; Total hip arthroplasty; Joint replacement; mvp repair; Mitral valve repair; Radiology with anesthesia (N/A, 05/04/2016); ir generic historical (05/04/2016); ir generic historical (05/04/2016); and ir generic historical (06/07/2016). Family: family history includes Coronary artery disease in his father; Rheumatic fever in his  mother.  Laboratory Chemistry Profile   Renal Lab Results  Component Value Date   BUN 18 03/14/2022   CREATININE 0.98 03/14/2022   BCR 19 02/06/2020   GFR 84.36  11/01/2021   GFRAA >60 11/03/2018   GFRNONAA >60 03/14/2022    Hepatic Lab Results  Component Value Date   AST 25 03/14/2022   ALT 16 03/14/2022   ALBUMIN 3.2 (L) 03/14/2022   ALKPHOS 68 03/14/2022   LIPASE 23 03/14/2022    Electrolytes Lab Results  Component Value Date   NA 137 03/14/2022   K 3.7 03/14/2022   CL 104 03/14/2022   CALCIUM 8.8 (L) 03/14/2022   MG 1.7 03/15/2017   PHOS 2.4 (L) 05/07/2016    Bone No results found for: "VD25OH", "VD125OH2TOT", "VO1607PX1", "GG2694WN4", "25OHVITD1", "25OHVITD2", "25OHVITD3", "TESTOFREE", "TESTOSTERONE"  Inflammation (CRP: Acute Phase) (ESR: Chronic Phase) Lab Results  Component Value Date   LATICACIDVEN 1.9 03/15/2022         Note: Above Lab results reviewed.  Recent Imaging Review  ECHOCARDIOGRAM COMPLETE    ECHOCARDIOGRAM REPORT       Patient Name:   Richards MAXIMUM REILAND Date of Exam: 03/15/2022 Medical Rec #:  [id number]      Height:       68.0 in Accession #:    [id number]     Weight:       185.0 lb Date of Birth:  17-Nov-1932      BSA:          1.977 m Patient Age:    89 years       BP:           125/64 mmHg Patient Gender: M              HR:           98 bpm. Exam Location:  Inpatient  Procedure: 2D Echo, Cardiac Doppler and Color Doppler                                MODIFIED REPORT: This report was modified by Cherlynn Kaiser MD on 03/15/2022 due to revision.  Indications:     Syncope   History:         Patient has no prior history of Echocardiogram examinations.                  CAD, Arrythmias:Atrial Fibrillation; Risk Factors:Hypertension.                    Mitral Valve: unknown size prosthetic annuloplasty ring valve                  is present in the mitral position. Procedure Date: 2002 [facility].   Sonographer:     Jyl Heinz Referring Phys:   [id number] [place] Diagnosing Phys: Cherlynn Kaiser MD  IMPRESSIONS   1. Left ventricular ejection fraction, by estimation, is 55 to 60%. The left ventricle has normal function. The left ventricle has no regional wall motion abnormalities. There is mild left ventricular hypertrophy. Left ventricular diastolic parameters  are indeterminate.  2. Right ventricular systolic function is normal. The right ventricular size is mildly enlarged. There is moderately elevated pulmonary artery systolic pressure. The estimated right ventricular systolic pressure is 81.0 mmHg.  3. Left atrial size was severely dilated.  4. Right atrial size was severely dilated.  5. The mitral valve has been repaired/replaced. Mild mitral valve regurgitation. The mean mitral valve gradient is 3.6 mmHg with average heart rate of 70 bpm. There is a unknown size prosthetic annuloplasty ring present in the mitral position. Procedure  Date: 2002  [facility]. Echo findings are consistent with normal structure and function of the mitral valve prosthesis.  6. Tricuspid valve regurgitation is severe.  7. The aortic valve is abnormal. There is mild calcification of the aortic valve. Aortic valve regurgitation is moderate. No aortic stenosis is present.  8. The inferior vena cava is dilated in size with <50% respiratory variability, suggesting right atrial pressure of 15 mmHg.  FINDINGS  Left Ventricle: Left ventricular ejection fraction, by estimation, is 55 to 60%. The left ventricle has normal function. The left ventricle has no regional wall motion abnormalities. The left ventricular internal cavity size was normal in size. There is  mild left ventricular hypertrophy. Abnormal (paradoxical) septal motion consistent with post-operative status. Left ventricular diastolic parameters are indeterminate.  Right Ventricle: The right ventricular size is mildly enlarged. No increase in right ventricular wall thickness. Right ventricular systolic  function is normal. There is moderately elevated pulmonary artery systolic pressure. The tricuspid regurgitant  velocity is 2.89 m/s, and with an assumed right atrial pressure of 15 mmHg, the estimated right ventricular systolic pressure is 27.0 mmHg.  Left Atrium: Left atrial size was severely dilated.  Right Atrium: Right atrial size was severely dilated.  Pericardium: There is no evidence of pericardial effusion.  Mitral Valve: The mitral valve has been repaired/replaced. Mild mitral valve regurgitation. There is a unknown size prosthetic annuloplasty ring present in the mitral position. Procedure Date: 2002 [facility]. Echo findings are consistent with normal structure  and function of the mitral valve prosthesis. The mean mitral valve gradient is 3.6 mmHg with average heart rate of 70 bpm.  Tricuspid Valve: The tricuspid valve is normal in structure. Tricuspid valve regurgitation is severe. No evidence of tricuspid stenosis.  Aortic Valve: The aortic valve is abnormal. There is mild calcification of the aortic valve. Aortic valve regurgitation is moderate. Aortic regurgitation PHT measures 558 msec. No aortic stenosis is present. Aortic valve peak gradient measures 9.5 mmHg.  Pulmonic Valve: The pulmonic valve was normal in structure. Pulmonic valve regurgitation is mild. No evidence of pulmonic stenosis.  Aorta: The aortic root is normal in size and structure. Ascending aorta measurements are within normal limits for age when indexed to body surface area.  Venous: The inferior vena cava is dilated in size with less than 50% respiratory variability, suggesting right atrial pressure of 15 mmHg.  IAS/Shunts: No atrial level shunt detected by color flow Doppler.    LEFT VENTRICLE PLAX 2D LVIDd:         3.90 cm      Diastology LVIDs:         3.00 cm      LV e' medial:    6.31 cm/s LV PW:         1.10 cm      LV E/e' medial:  25.0 LV IVS:        1.10 cm      LV e' lateral:   7.51 cm/s LVOT  diam:     2.20 cm      LV E/e' lateral: 21.0 LV SV:         72 LV SV Index:   36 LVOT Area:     3.80 cm   LV Volumes (MOD) LV vol d, MOD A2C: 107.0 ml LV vol d, MOD A4C: 120.0 ml LV vol s, MOD A2C: 45.4 ml LV vol s, MOD A4C: 49.9 ml LV SV MOD A2C:     61.6 ml LV SV MOD A4C:  120.0 ml LV SV MOD BP:      68.1 ml  RIGHT VENTRICLE            IVC RV Basal diam:  4.30 cm    IVC diam: 2.20 cm RV S prime:     9.25 cm/s TAPSE (M-mode): 1.4 cm  LEFT ATRIUM             Index        RIGHT ATRIUM           Index LA diam:        4.50 cm 2.28 cm/m   RA Area:     18.70 cm LA Vol (A2C):   89.3 ml 45.17 ml/m  RA Volume:   48.60 ml  24.58 ml/m LA Vol (A4C):   72.5 ml 36.67 ml/m LA Biplane Vol: 85.0 ml 42.99 ml/m  AORTIC VALVE AV Area (Vmax): 2.57 cm AV Vmax:        154.00 cm/s AV Peak Grad:   9.5 mmHg LVOT Vmax:      104.00 cm/s LVOT Vmean:     72.400 cm/s LVOT VTI:       0.189 m AI PHT:         558 msec   AORTA Ao Root diam: 3.70 cm Ao Asc diam:  3.40 cm  MITRAL VALVE                TRICUSPID VALVE MV Area (PHT): 5.09 cm     TR Peak grad:   33.4 mmHg MV Mean grad:  3.6 mmHg     TR Vmax:        289.00 cm/s MV Decel Time: 149 msec MV E velocity: 158.00 cm/s  SHUNTS                             Systemic VTI:  0.19 m                             Systemic Diam: 2.20 cm  Cherlynn Kaiser MD Electronically signed by Cherlynn Kaiser MD Signature Date/Time: 03/15/2022/4:12:04 PM      Final (Updated)   CT ABDOMEN PELVIS W CONTRAST CLINICAL DATA:  Syncope, hypotension, abdominal pain.  EXAM: CT ANGIOGRAPHY CHEST  CT ABDOMEN AND PELVIS WITH CONTRAST  TECHNIQUE: Multidetector CT imaging of the chest was performed using the standard protocol during bolus administration of intravenous contrast. Multiplanar CT image reconstructions and MIPs were obtained to evaluate the vascular anatomy. Multidetector CT imaging of the abdomen and pelvis was performed using the standard  protocol during bolus administration of intravenous contrast.  RADIATION DOSE REDUCTION: This exam was performed according to the departmental dose-optimization program which includes automated exposure control, adjustment of the mA and/or kV according to patient size and/or use of iterative reconstruction technique.  CONTRAST:  129m OMNIPAQUE IOHEXOL 350 MG/ML SOLN  COMPARISON:  Chest radiograph dated 03/14/2022  FINDINGS: CTA CHEST FINDINGS  Cardiovascular: Satisfactory opacification of the bilateral pulmonary arteries to the segmental level. No evidence of pulmonary embolism.  Study is not tailored for evaluation of the thoracic aorta. No evidence of thoracic aortic aneurysm. Atherosclerotic calcifications of the arch.  Cardiomegaly.  No pericardial effusion.  Prosthetic mitral valve.  Coronary atherosclerosis of the [name]  Mediastinum/Nodes: No suspicious mediastinal lymphadenopathy.  Visualized thyroid is unremarkable.  Lungs/Pleura: Eventration of left hemidiaphragm with associated lingular and left lower lobe atelectasis.  Mild atelectasis in the posterior upper lobes and medial right lower lobe.  No focal consolidation.  Mild centrilobular and paraseptal emphysematous changes, upper lung predominant.  Evaluation lung parenchyma is constrained by respiratory motion. Within that constraint, there are no suspicious pulmonary nodules.  No pleural effusion or pneumothorax.  Musculoskeletal: Mild degenerative changes of the lower thoracic spine. Vertebral hemangioma at T3. Median sternotomy.  Review of the MIP images confirms the above findings.  CT ABDOMEN and PELVIS FINDINGS  Hepatobiliary: Scattered hepatic cysts measuring up to 19 mm in segment 4.  Gallbladder is unremarkable. No intrahepatic or extrahepatic ductal dilatation.  Pancreas: Mild parenchymal atrophy.  Spleen: Within normal limits.  Adrenals/Urinary Tract: Adrenal glands are within  normal limits.  Bilateral renal cysts, measuring up to 3.2 cm in the right upper kidney (series 5/image 23), benign (Bosniak II). No hydronephrosis.  Bladder is within normal limits, noting streak artifact.  Stomach/Bowel: Stomach is within normal limits.  No evidence of bowel obstruction.  Appendix is not discretely visualized.  Scattered colonic diverticulosis, without evidence of diverticulitis.  Vascular/Lymphatic: No evidence of abdominal aortic aneurysm.  Atherosclerotic calcifications of the abdominal aorta and branch vessels.  No suspicious abdominopelvic lymphadenopathy.  Reproductive: Prostatomegaly, suggesting BPH.  Other: No abdominopelvic ascites.  Mild presacral fluid/stranding (series 3/image 63).  Musculoskeletal: Right hip arthroplasty, without evidence of complication. Degenerative changes of the visualized thoracolumbar spine. Median sternotomy.  Review of the MIP images confirms the above findings.  IMPRESSION: No evidence of pulmonary embolism. No evidence of acute cardiopulmonary disease.  No acute findings in the abdomen/pelvis.  Additional ancillary findings as above.  Electronically Signed   By: Julian Hy M.D.   On: 03/15/2022 00:39 CT Angio Chest PE W and/or Wo Contrast CLINICAL DATA:  Syncope, hypotension, abdominal pain.  EXAM: CT ANGIOGRAPHY CHEST  CT ABDOMEN AND PELVIS WITH CONTRAST  TECHNIQUE: Multidetector CT imaging of the chest was performed using the standard protocol during bolus administration of intravenous contrast. Multiplanar CT image reconstructions and MIPs were obtained to evaluate the vascular anatomy. Multidetector CT imaging of the abdomen and pelvis was performed using the standard protocol during bolus administration of intravenous contrast.  RADIATION DOSE REDUCTION: This exam was performed according to the departmental dose-optimization program which includes automated exposure control,  adjustment of the mA and/or kV according to patient size and/or use of iterative reconstruction technique.  CONTRAST:  171m OMNIPAQUE IOHEXOL 350 MG/ML SOLN  COMPARISON:  Chest radiograph dated 03/14/2022  FINDINGS: CTA CHEST FINDINGS  Cardiovascular: Satisfactory opacification of the bilateral pulmonary arteries to the segmental level. No evidence of pulmonary embolism.  Study is not tailored for evaluation of the thoracic aorta. No evidence of thoracic aortic aneurysm. Atherosclerotic calcifications of the arch.  Cardiomegaly.  No pericardial effusion.  Prosthetic mitral valve.  Coronary atherosclerosis of the [name]  Mediastinum/Nodes: No suspicious mediastinal lymphadenopathy.  Visualized thyroid is unremarkable.  Lungs/Pleura: Eventration of left hemidiaphragm with associated lingular and left lower lobe atelectasis.  Mild atelectasis in the posterior upper lobes and medial right lower lobe.  No focal consolidation.  Mild centrilobular and paraseptal emphysematous changes, upper lung predominant.  Evaluation lung parenchyma is constrained by respiratory motion. Within that constraint, there are no suspicious pulmonary nodules.  No pleural effusion or pneumothorax.  Musculoskeletal: Mild degenerative changes of the lower thoracic spine. Vertebral hemangioma at T3. Median sternotomy.  Review of the MIP images confirms the above findings.  CT ABDOMEN and PELVIS FINDINGS  Hepatobiliary: Scattered hepatic cysts measuring up  to 19 mm in segment 4.  Gallbladder is unremarkable. No intrahepatic or extrahepatic ductal dilatation.  Pancreas: Mild parenchymal atrophy.  Spleen: Within normal limits.  Adrenals/Urinary Tract: Adrenal glands are within normal limits.  Bilateral renal cysts, measuring up to 3.2 cm in the right upper kidney (series 5/image 23), benign (Bosniak II). No hydronephrosis.  Bladder is within normal limits, noting streak  artifact.  Stomach/Bowel: Stomach is within normal limits.  No evidence of bowel obstruction.  Appendix is not discretely visualized.  Scattered colonic diverticulosis, without evidence of diverticulitis.  Vascular/Lymphatic: No evidence of abdominal aortic aneurysm.  Atherosclerotic calcifications of the abdominal aorta and branch vessels.  No suspicious abdominopelvic lymphadenopathy.  Reproductive: Prostatomegaly, suggesting BPH.  Other: No abdominopelvic ascites.  Mild presacral fluid/stranding (series 3/image 63).  Musculoskeletal: Right hip arthroplasty, without evidence of complication. Degenerative changes of the visualized thoracolumbar spine. Median sternotomy.  Review of the MIP images confirms the above findings.  IMPRESSION: No evidence of pulmonary embolism. No evidence of acute cardiopulmonary disease.  No acute findings in the abdomen/pelvis.  Additional ancillary findings as above.  Electronically Signed   By: Julian Hy M.D.   On: 03/15/2022 00:39 CT Head Wo Contrast CLINICAL DATA:  Mental status change  EXAM: CT HEAD WITHOUT CONTRAST  TECHNIQUE: Contiguous axial images were obtained from the base of the skull through the vertex without intravenous contrast.  RADIATION DOSE REDUCTION: This exam was performed according to the departmental dose-optimization program which includes automated exposure control, adjustment of the mA and/or kV according to patient size and/or use of iterative reconstruction technique.  COMPARISON:  CT brain 10/31/2017 report, 05/10/2016  FINDINGS: Brain: No acute territorial infarction, hemorrhage or intracranial mass. Chronic right MCA infarct with encephalomalacia and ex vacuo dilatation of the right ventricle. Small chronic appearing infarct within the left posterior parietal white matter. Atrophy. Mild chronic small vessel ischemic changes of the white matter.  Vascular: No hyperdense vessels.  No  unexpected calcification  Skull: Normal. Negative for fracture or focal lesion.  Sinuses/Orbits: No acute finding.  Other: None.  IMPRESSION: 1. No CT evidence for acute intracranial abnormality. 2. Chronic right MCA infarct. Atrophy and mild chronic small vessel ischemic changes of the white matter.  Electronically Signed   By: Donavan Foil M.D.   On: 03/15/2022 00:14 Note: Reviewed        Physical Exam  General appearance: Well nourished, well developed, and well hydrated. In no apparent acute distress Mental status: Alert, oriented x 3 (person, place, & time)       Respiratory: No evidence of acute respiratory distress Eyes: PERLA Vitals: BP 122/62   Pulse (!) 53   Temp (!) 97.4 F (36.3 C) (Temporal)   Resp 15   Ht '5\' 6"'$  (1.676 m)   Wt 185 lb (83.9 kg) Comment: stated  SpO2 98%   BMI 29.86 kg/m  BMI: Estimated body mass index is 29.86 kg/m as calculated from the following:   Height as of this encounter: '5\' 6"'$  (1.676 m).   Weight as of this encounter: 185 lb (83.9 kg). Ideal: Ideal body weight: 63.8 kg (140 lb 10.5 oz) Adjusted ideal body weight: 71.8 kg (158 lb 6.3 oz)  Patient is severely deconditioned, severe thoracic kyphosis and scoliosis.  Presents in wheelchair. Severe deconditioning.  Assessment   Diagnosis Status  1. Chronic pain syndrome   2. Lumbar spondylosis   3. Chronic radicular lumbar pain   4. Lumbar degenerative disc disease   5. Bilateral primary  osteoarthritis of knee   6. Spinal stenosis, lumbar region, with neurogenic claudication     Controlled Controlled Controlled   Updated Problems: No problems updated.    Plan of Care  Problem-specific:  No problem-specific Assessment & Plan notes found for this encounter.  Mr. TREVOR WILKIE has a current medication list which includes the following long-term medication(s): apixaban, cetirizine, metoprolol succinate, pantoprazole, pravastatin, quetiapine, sertraline, and [START ON  10/20/2022] pregabalin.  Pharmacotherapy (Medications Ordered): Meds ordered this encounter  Medications   oxyCODONE-acetaminophen (PERCOCET) 7.5-325 MG tablet    Sig: Take 1 tablet by mouth every 12 (twelve) hours as needed for severe pain.    Dispense:  60 tablet    Refill:  0   oxyCODONE-acetaminophen (PERCOCET) 7.5-325 MG tablet    Sig: Take 1 tablet by mouth every 12 (twelve) hours as needed for severe pain.    Dispense:  60 tablet    Refill:  0   oxyCODONE-acetaminophen (PERCOCET) 7.5-325 MG tablet    Sig: Take 1 tablet by mouth every 12 (twelve) hours as needed for severe pain.    Dispense:  60 tablet    Refill:  0   pregabalin (LYRICA) 25 MG capsule    Sig: Take 1 capsule (25 mg total) by mouth at bedtime.    Dispense:  30 capsule    Refill:  5   Orders:  No orders of the defined types were placed in this encounter.  Follow-up plan:   Return in about 3 months (around 12/07/2022) for Medication Management, in person.    Recent Visits No visits were found meeting these conditions. Showing recent visits within past 90 days and meeting all other requirements Today's Visits Date Type Provider Dept  09/12/22 Office Visit Gillis Santa, MD [facility]  Showing today's visits and meeting all other requirements Future Appointments Date Type Provider Dept  12/05/22 Appointment Gillis Santa, MD [facility]  Showing future appointments within next 90 days and meeting all other requirements  I discussed the assessment and treatment plan with the patient. The patient was provided an opportunity to ask questions and all were answered. The patient agreed with the plan and demonstrated an understanding of the instructions.  Patient advised to call back or seek an in-person evaluation if the symptoms or condition worsens.  Duration of encounter: 14mnutes.  Total time on encounter, as per AMA guidelines included both the face-to-face and non-face-to-face time  personally spent by the physician and/or other qualified health care professional(s) on the day of the encounter (includes time in activities that require the physician or other qualified health care professional and does not include time in activities normally performed by clinical staff). Physician's time may include the following activities when performed: preparing to see the patient (eg, review of tests, pre-charting review of records) obtaining and/or reviewing separately obtained history performing a medically appropriate examination and/or evaluation counseling and educating the patient/family/caregiver ordering medications, tests, or procedures referring and communicating with other health care professionals (when not separately reported) documenting clinical information in the electronic or other health record independently interpreting results (not separately reported) and communicating results to the patient/ family/caregiver care coordination (not separately reported)  Note by: BGillis Santa MD Date: 09/12/2022; Time: 3:50 PM

## 2022-09-12 NOTE — Telephone Encounter (Signed)
My chart message sent re elevators 

## 2022-09-13 DIAGNOSIS — L89322 Pressure ulcer of left buttock, stage 2: Secondary | ICD-10-CM | POA: Diagnosis not present

## 2022-09-13 DIAGNOSIS — Z7901 Long term (current) use of anticoagulants: Secondary | ICD-10-CM | POA: Diagnosis not present

## 2022-09-13 DIAGNOSIS — I4819 Other persistent atrial fibrillation: Secondary | ICD-10-CM | POA: Diagnosis not present

## 2022-09-13 DIAGNOSIS — I69354 Hemiplegia and hemiparesis following cerebral infarction affecting left non-dominant side: Secondary | ICD-10-CM | POA: Diagnosis not present

## 2022-09-13 DIAGNOSIS — L89023 Pressure ulcer of left elbow, stage 3: Secondary | ICD-10-CM | POA: Diagnosis not present

## 2022-09-13 DIAGNOSIS — I2510 Atherosclerotic heart disease of native coronary artery without angina pectoris: Secondary | ICD-10-CM | POA: Diagnosis not present

## 2022-09-14 DIAGNOSIS — L89322 Pressure ulcer of left buttock, stage 2: Secondary | ICD-10-CM | POA: Diagnosis not present

## 2022-09-14 DIAGNOSIS — L89023 Pressure ulcer of left elbow, stage 3: Secondary | ICD-10-CM | POA: Diagnosis not present

## 2022-09-14 DIAGNOSIS — Z7901 Long term (current) use of anticoagulants: Secondary | ICD-10-CM | POA: Diagnosis not present

## 2022-09-14 DIAGNOSIS — I2510 Atherosclerotic heart disease of native coronary artery without angina pectoris: Secondary | ICD-10-CM | POA: Diagnosis not present

## 2022-09-14 DIAGNOSIS — I4819 Other persistent atrial fibrillation: Secondary | ICD-10-CM | POA: Diagnosis not present

## 2022-09-14 DIAGNOSIS — I69354 Hemiplegia and hemiparesis following cerebral infarction affecting left non-dominant side: Secondary | ICD-10-CM | POA: Diagnosis not present

## 2022-09-15 DIAGNOSIS — I4819 Other persistent atrial fibrillation: Secondary | ICD-10-CM | POA: Diagnosis not present

## 2022-09-15 DIAGNOSIS — I2510 Atherosclerotic heart disease of native coronary artery without angina pectoris: Secondary | ICD-10-CM | POA: Diagnosis not present

## 2022-09-15 DIAGNOSIS — I69354 Hemiplegia and hemiparesis following cerebral infarction affecting left non-dominant side: Secondary | ICD-10-CM | POA: Diagnosis not present

## 2022-09-15 DIAGNOSIS — L89023 Pressure ulcer of left elbow, stage 3: Secondary | ICD-10-CM | POA: Diagnosis not present

## 2022-09-15 DIAGNOSIS — Z7901 Long term (current) use of anticoagulants: Secondary | ICD-10-CM | POA: Diagnosis not present

## 2022-09-15 DIAGNOSIS — L89322 Pressure ulcer of left buttock, stage 2: Secondary | ICD-10-CM | POA: Diagnosis not present

## 2022-09-19 ENCOUNTER — Other Ambulatory Visit: Payer: Self-pay | Admitting: Family Medicine

## 2022-09-19 DIAGNOSIS — L89322 Pressure ulcer of left buttock, stage 2: Secondary | ICD-10-CM | POA: Diagnosis not present

## 2022-09-19 DIAGNOSIS — Z7901 Long term (current) use of anticoagulants: Secondary | ICD-10-CM | POA: Diagnosis not present

## 2022-09-19 DIAGNOSIS — I480 Paroxysmal atrial fibrillation: Secondary | ICD-10-CM

## 2022-09-19 DIAGNOSIS — L89023 Pressure ulcer of left elbow, stage 3: Secondary | ICD-10-CM | POA: Diagnosis not present

## 2022-09-19 DIAGNOSIS — I2510 Atherosclerotic heart disease of native coronary artery without angina pectoris: Secondary | ICD-10-CM | POA: Diagnosis not present

## 2022-09-19 DIAGNOSIS — I69354 Hemiplegia and hemiparesis following cerebral infarction affecting left non-dominant side: Secondary | ICD-10-CM | POA: Diagnosis not present

## 2022-09-19 DIAGNOSIS — I4819 Other persistent atrial fibrillation: Secondary | ICD-10-CM | POA: Diagnosis not present

## 2022-09-19 DIAGNOSIS — I6300 Cerebral infarction due to thrombosis of unspecified precerebral artery: Secondary | ICD-10-CM

## 2022-09-22 DIAGNOSIS — I4819 Other persistent atrial fibrillation: Secondary | ICD-10-CM | POA: Diagnosis not present

## 2022-09-22 DIAGNOSIS — L89023 Pressure ulcer of left elbow, stage 3: Secondary | ICD-10-CM | POA: Diagnosis not present

## 2022-09-22 DIAGNOSIS — Z7901 Long term (current) use of anticoagulants: Secondary | ICD-10-CM | POA: Diagnosis not present

## 2022-09-22 DIAGNOSIS — I2510 Atherosclerotic heart disease of native coronary artery without angina pectoris: Secondary | ICD-10-CM | POA: Diagnosis not present

## 2022-09-22 DIAGNOSIS — L89322 Pressure ulcer of left buttock, stage 2: Secondary | ICD-10-CM | POA: Diagnosis not present

## 2022-09-22 DIAGNOSIS — I69354 Hemiplegia and hemiparesis following cerebral infarction affecting left non-dominant side: Secondary | ICD-10-CM | POA: Diagnosis not present

## 2022-09-24 DIAGNOSIS — Z86718 Personal history of other venous thrombosis and embolism: Secondary | ICD-10-CM | POA: Diagnosis not present

## 2022-09-24 DIAGNOSIS — L89023 Pressure ulcer of left elbow, stage 3: Secondary | ICD-10-CM | POA: Diagnosis not present

## 2022-09-24 DIAGNOSIS — I2510 Atherosclerotic heart disease of native coronary artery without angina pectoris: Secondary | ICD-10-CM | POA: Diagnosis not present

## 2022-09-24 DIAGNOSIS — M179 Osteoarthritis of knee, unspecified: Secondary | ICD-10-CM | POA: Diagnosis not present

## 2022-09-24 DIAGNOSIS — Z952 Presence of prosthetic heart valve: Secondary | ICD-10-CM | POA: Diagnosis not present

## 2022-09-24 DIAGNOSIS — I119 Hypertensive heart disease without heart failure: Secondary | ICD-10-CM | POA: Diagnosis not present

## 2022-09-24 DIAGNOSIS — Z7901 Long term (current) use of anticoagulants: Secondary | ICD-10-CM | POA: Diagnosis not present

## 2022-09-24 DIAGNOSIS — I69354 Hemiplegia and hemiparesis following cerebral infarction affecting left non-dominant side: Secondary | ICD-10-CM | POA: Diagnosis not present

## 2022-09-24 DIAGNOSIS — I4819 Other persistent atrial fibrillation: Secondary | ICD-10-CM | POA: Diagnosis not present

## 2022-09-26 ENCOUNTER — Encounter (HOSPITAL_BASED_OUTPATIENT_CLINIC_OR_DEPARTMENT_OTHER): Payer: Medicare Other | Attending: General Surgery | Admitting: General Surgery

## 2022-09-26 DIAGNOSIS — M1990 Unspecified osteoarthritis, unspecified site: Secondary | ICD-10-CM | POA: Insufficient documentation

## 2022-09-26 DIAGNOSIS — Z833 Family history of diabetes mellitus: Secondary | ICD-10-CM | POA: Diagnosis not present

## 2022-09-26 DIAGNOSIS — I2510 Atherosclerotic heart disease of native coronary artery without angina pectoris: Secondary | ICD-10-CM | POA: Diagnosis not present

## 2022-09-26 DIAGNOSIS — I10 Essential (primary) hypertension: Secondary | ICD-10-CM | POA: Diagnosis not present

## 2022-09-26 DIAGNOSIS — L89023 Pressure ulcer of left elbow, stage 3: Secondary | ICD-10-CM | POA: Insufficient documentation

## 2022-09-26 DIAGNOSIS — I4819 Other persistent atrial fibrillation: Secondary | ICD-10-CM | POA: Diagnosis not present

## 2022-09-26 NOTE — Progress Notes (Signed)
CRUZ, BONG Jerilynn Mages ([id number]) [id number] Page 1 of 9 Visit Report for 09/26/2022 Chief Complaint Document Details Patient Name: Date of Service: [name] ID M. 09/26/2022 2:00 PM Medical Record Number: [id number] Patient Account Number: [id number] Date of Birth/Sex: Treating RN: [birth year] (89 y.o. M) Primary Care Provider: Abelino Derrick Other Clinician: Referring Provider: Treating Provider/Extender: Sandi Raveling in Treatment: 32 Information Obtained from: Patient Chief Complaint 08/05/2019; patient is here for review of pressure ulcers on his buttock 02/14/22: patient is here for review of pressure ulcers on his left elbow Electronic Signature(s) Signed: 09/26/2022 2:43:40 PM By: Fredirick Maudlin MD FACS Entered By: Fredirick Maudlin on 09/26/2022 14:43:40 -------------------------------------------------------------------------------- Debridement Details Patient Name: Date of Service: [name], DA V ID M. 09/26/2022 2:00 PM Medical Record Number: [id number] Patient Account Number: [id number] Date of Birth/Sex: Treating RN: 1932/10/19 (89 y.o. [name] Primary Care Provider: Abelino Derrick Other Clinician: Referring Provider: Treating Provider/Extender: Sandi Raveling in Treatment: 32 Debridement Performed for Assessment: Wound #1 Left Elbow Performed By: Physician Fredirick Maudlin, MD Debridement Type: Debridement Level of Consciousness (Pre-procedure): Awake and Alert Pre-procedure Verification/Time Out Yes - 14:28 Taken: Start Time: 14:28 Pain Control: Lidocaine 5% topical ointment T Area Debrided (L x W): otal 2.6 (cm) x 3.2 (cm) = 8.32 (cm) Tissue and other material debrided: Non-Viable, Slough, Slough Level: Non-Viable Tissue Debridement Description: Selective/Open Wound Instrument: Curette Bleeding: Minimum Hemostasis Achieved: Silver Nitrate End Time: 14:30 Procedural Pain:  0 Post Procedural Pain: 0 Response to Treatment: Procedure was tolerated well Level of Consciousness (Post- Awake and Alert procedure): Post Debridement Measurements of Total Wound Length: (cm) 2.6 Stage: Category/Stage III Width: (cm) 3.2 Depth: (cm) 0.1 Volume: (cm) 0.653 Character of Wound/Ulcer Post Debridement: Improved [name] ([id number]) [id number] Page 2 of 9 Post Procedure Diagnosis Same as Pre-procedure Notes Scribed for Dr. Celine Ahr by J.Scotton Electronic Signature(s) Signed: 09/26/2022 3:13:36 PM By: Fredirick Maudlin MD FACS Signed: 09/26/2022 3:35:43 PM By: Dellie Catholic RN Entered By: Dellie Catholic on 09/26/2022 14:38:09 -------------------------------------------------------------------------------- HPI Details Patient Name: Date of Service: [name], DA V ID M. 09/26/2022 2:00 PM Medical Record Number: [id number] Patient Account Number: [id number] Date of Birth/Sex: Treating RN: [birth year] (89 y.o. M) Primary Care Provider: Abelino Derrick Other Clinician: Referring Provider: Treating Provider/Extender: Sandi Raveling in Treatment: 58 History of Present Illness HPI Description: ADMISSION 08/05/19 This is an 89 year old man who arrives in clinic accompanied by his son. 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 Apparently noted recently to have a stage I pressure area on the left buttock. He does not have a wound history. He does have been air fluidized mattress. They have a wheelchair cushion as well as a pillow over the top of this. He is not incontinent of urine or bowel. Past medical history includes hypertension, osteoarthritis of the knee, atrial fibrillation, history of mitral valve repair, right hemisphere CVA with left hemiparesis, history of a DVT READMISSION 02/14/2022 The patient is here today for evaluation of pressure ulcers on his left elbow and  possible right buttock ulcer. Apparently when he was seen by Dr. Dellia Nims in 2020, no ulcer was appreciated on the buttock and no further wound care involvement occurred. 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 He does have home health aides  and they are turning him every 2 hours side to side. He is on a memory foam mattress at this time. 03/14/2022: The patient was scheduled to see me on July 11, but apparently his son panicked about some bright red blood per rectum and rushed him to the emergency room. There is also a comment in the electronic medical record that the patient's son thought the bone was exposed on his elbow. After waiting a prolonged period of time at the emergency department, they left without being seen. Today, it appears that the dressing on the patient's elbow was never changed since our last visit. The 2 wounds have converged creating a larger wound. According to the intake nurse, there was a foul odor from the wound and dressing. There is extensive slough on the wound surface. The bleeding per rectum turns out to be hemorrhoids. The patient's son expresses that he does not know how to manage this. 04/07/2022: For unclear reasons, the patient has not returned to clinic until they were contacted today and they made an add-on visit. The patient's son was not originally present at the beginning of the visit but showed up about 25 minutes into the visit. The patient was accompanied by a home aide. She reported that when she cleaned the patient up today, there was just a Band-Aid on his wound. She said the surface was fairly mucky but she was able to wash this off. It is abundantly clear that this wound is not being properly cared for. Apparently he did have [facility] home health but they have not been out in some time and it is not clear the reason for this. The  surface of the wound is a bit cleaner so perhaps the Santyl has been applied, but unfortunately there is now a deep tunnel that extends up from the elbow to the patient's posterior upper arm for a number centimeters. There is no evidence of infection or purulent drainage. It is also clear that the patient continues to spend a substantial portion of his time leaning on the elbow. 05/23/2022: Once again, the patient has failed to return to clinic for about 6 weeks and the reason remains unclear. He is apparently receiving home health assistance. The wound on his elbow is covered with hypertrophic granulation tissue. He also has a blister just distal to the wound on his posterior forearm. He has a new stage III pressure ulcer on his left gluteus with slough accumulation. He apparently spends a substantial portion of his days sitting in a wheelchair. I do not believe he has a Financial trader. Due to his stroke, he lists to the left side, which is how his ulcer on his elbow developed. I suspect the gluteal ulcer is as a result of this, as well. 06/13/2022: The wound on his left gluteus is nearly healed. It is very superficial and quite clean. His son reports that he has just been applying Desitin to the area. The elbow wound is smaller but still has some hypertrophic granulation tissue and slough accumulation. No concern for infection at either site. 06/27/2022: The buttock wounds are healed. There is some tissue maceration without any significant skin breakdown. The elbow wound continues to contract. There is still some hypertrophic granulation tissue and slough buildup. 07/25/2022: The buttock wounds have reopened. The periwound skin has been kept very dry through liberal use of zinc oxide, however. The elbow wound dressing did not look like it had been changed in at least a week when it was removed.  The underlying wound, however has not reaccumulated any hypertrophic granulation tissue; there is a thin layer  of slough present. No malodor or purulent drainage from the wound itself. 08/15/2022: There is a tiny opening remaining on the buttocks. It is clean and there is no evidence of tissue maceration. The elbow looks very good. There is good beefy tissue present and it is flush with the surrounding skin. Home health has been coming out. 09/05/2022: The wound on his bottom is healed. The elbow shows signs of pressure induced deep tissue injury and also has some hypertrophic granulation KRISS, ISHLER ([id number]) [id number] Page 3 of 9 tissue. 09/26/2022: The elbow looks much better today. There is a little bit of slough on the surface but there is no undermining or tunneling. He does have some hypertrophic granulation tissue accumulation. Electronic Signature(s) Signed: 09/26/2022 2:48:41 PM By: Fredirick Maudlin MD FACS Entered By: Fredirick Maudlin on 09/26/2022 14:48:41 -------------------------------------------------------------------------------- Physical Exam Details Patient Name: Date of Service: GO RDO Brian [name] ID M. 09/26/2022 2:00 PM Medical Record Number: [id number] Patient Account Number: [id number] Date of Birth/Sex: Treating RN: 02-26-1933 (89 y.o. M) Primary Care Provider: Abelino Derrick Other Clinician: Referring Provider: Treating Provider/Extender: Sandi Raveling in Treatment: 32 Constitutional . . . . no acute distress. Respiratory Normal work of breathing on room air. Notes 09/26/2022: The elbow looks much better today. There is a little bit of slough on the surface but there is no undermining or tunneling. He does have some hypertrophic granulation tissue accumulation. Electronic Signature(s) Signed: 09/26/2022 2:49:37 PM By: Fredirick Maudlin MD FACS Entered By: Fredirick Maudlin on 09/26/2022 14:49:36 -------------------------------------------------------------------------------- Physician Orders Details Patient Name: Date of  Service: GO RDO Brian [name] ID M. 09/26/2022 2:00 PM Medical Record Number: [id number] Patient Account Number: [id number] Date of Birth/Sex: Treating RN: 02-10-1933 (89 y.o. [name] Primary Care Provider: Abelino Derrick Other Clinician: Referring Provider: Treating Provider/Extender: Sandi Raveling in Treatment: [phone] Verbal / Phone Orders: No Diagnosis Coding Follow-up Appointments Return appointment in 3 weeks. - Dr. Francee Nodal Shower/ Hygiene May shower and wash wound with soap and water. - with dressing changes Off-Loading Turn and reposition every 2 hours - stay off of left elbow wear prevalon boot on elbow as directed Other: - limit time in wheelchair, reposition in wheelchair every 1-2 hours Home Health No change in wound care orders this week; continue Home Health for wound care. May utilize formulary equivalent dressing for wound treatment orders unless otherwise specified. Dressing changes to be completed by [facility] on Monday / Wednesday / Friday except when patient has scheduled visit at [facility], [place] ([id number]) [phone].pdf Page 4 of [place]. Other Home Health Orders/Instructions: - [facility] Wound Treatment Wound #1 - Elbow Wound Laterality: Left Cleanser: Soap and Water Every Other Day/30 Days Discharge Instructions: May shower and wash wound with dial antibacterial soap and water prior to dressing change. Cleanser: Wound Cleanser (Generic) Every Other Day/30 Days Discharge Instructions: Cleanse the wound with wound cleanser prior to applying a clean dressing using gauze sponges, not tissue or cotton balls. Prim Dressing: Hydrofera Blue Ready Transfer Foam, 4x5 (in/in) Every Other Day/30 Days ary Discharge Instructions: Apply to wound bed as instructed Secondary Dressing: ALLEVYN Gentle Border, 5x5 (in/in) (Generic) Every Other Day/30 Days Discharge Instructions: Apply over primary dressing as  directed. Add-Ons: Cotton Tip Applicator, 6 (in) (Generic) Every Other Day/30 Days Electronic Signature(s) Signed: 09/26/2022 3:13:36 PM By: Fredirick Maudlin MD FACS  Signed: 09/26/2022 3:35:43 PM By: Dellie Catholic RN Entered By: Dellie Catholic on 09/26/2022 14:50:09 -------------------------------------------------------------------------------- Problem List Details Patient Name: Date of Service: [name] ID M. 09/26/2022 2:00 PM Medical Record Number: [id number] Patient Account Number: [id number] Date of Birth/Sex: Treating RN: [birth year] (89 y.o. M) Primary Care Provider: Abelino Derrick Other Clinician: Referring Provider: Treating Provider/Extender: Sandi Raveling in Treatment: 32 Active Problems ICD-10 Encounter Code Description Active Date MDM Diagnosis L89.023 Pressure ulcer of left elbow, stage 3 02/14/2022 No Yes 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 02/14/2022 No Yes I48.19 Other persistent atrial fibrillation 02/14/2022 No Yes I25.10 Atherosclerotic heart disease of native coronary artery without angina pectoris 02/14/2022 No Yes Inactive Problems ICD-10 Code Description Active Date Inactive Date L89.323 Pressure ulcer of left buttock, stage 3 05/23/2022 05/23/2022 Resolved Problems Holstein, Junior Jerilynn Mages ([id number]) [id number] Page 5 of 9 Electronic Signature(s) Signed: 09/26/2022 2:42:55 PM By: Fredirick Maudlin MD FACS Entered By: Fredirick Maudlin on 09/26/2022 14:42:55 -------------------------------------------------------------------------------- Progress Note Details Patient Name: Date of Service: GO RDO Brian [name] ID M. 09/26/2022 2:00 PM Medical Record Number: [id number] Patient Account Number: [id number] Date of Birth/Sex: Treating RN: 06-24-1933 (89 y.o. M) Primary Care Provider: Abelino Derrick Other Clinician: Referring Provider: Treating Provider/Extender: Sandi Raveling in Treatment: 27 Subjective Chief Complaint Information obtained from Patient 08/05/2019; patient is here for review of pressure ulcers on his buttock 02/14/22: patient is here for review of pressure ulcers on his left elbow History of Present Illness (HPI) ADMISSION 08/05/19 This is an 89 year old man who arrives in clinic accompanied by his son. 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 Apparently noted recently to have a stage I pressure area on the left buttock. He does not have a wound history. He does have been air fluidized mattress. They have a wheelchair cushion as well as a pillow over the top of this. He is not incontinent of urine or bowel. Past medical history includes hypertension, osteoarthritis of the knee, atrial fibrillation, history of mitral valve repair, right hemisphere CVA with left hemiparesis, history of a DVT READMISSION 02/14/2022 The patient is here today for evaluation of pressure ulcers on his left elbow and possible right buttock ulcer. Apparently when he was seen by Dr. Dellia Nims in 2020, no ulcer was appreciated on the buttock and no further wound care involvement occurred. 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 He does have home health aides and they are turning him every 2 hours side to side. He is on a memory foam mattress at this time. 03/14/2022: The patient was scheduled to see me on July 11, but apparently his son panicked about some bright red blood per rectum and rushed him to the emergency room. There is also a comment in the electronic medical record that the patient's son thought the bone was exposed on his elbow. After waiting a prolonged period of time at the emergency department, they left without being seen. Today, it appears that the dressing on the  patient's elbow was never changed since our last visit. The 2 wounds have converged creating a larger wound. According to the intake nurse, there was a foul odor from the wound and dressing. There is extensive slough on the wound surface. The bleeding per rectum turns out to  be hemorrhoids. The patient's son expresses that he does not know how to manage this. 04/07/2022: For unclear reasons, the patient has not returned to clinic until they were contacted today and they made an add-on visit. The patient's son was not originally present at the beginning of the visit but showed up about 25 minutes into the visit. The patient was accompanied by a home aide. She reported that when she cleaned the patient up today, there was just a Band-Aid on his wound. She said the surface was fairly mucky but she was able to wash this off. It is abundantly clear that this wound is not being properly cared for. Apparently he did have [facility] home health but they have not been out in some time and it is not clear the reason for this. The surface of the wound is a bit cleaner so perhaps the Santyl has been applied, but unfortunately there is now a deep tunnel that extends up from the elbow to the patient's posterior upper arm for a number centimeters. There is no evidence of infection or purulent drainage. It is also clear that the patient continues to spend a substantial portion of his time leaning on the elbow. 05/23/2022: Once again, the patient has failed to return to clinic for about 6 weeks and the reason remains unclear. He is apparently receiving home health assistance. The wound on his elbow is covered with hypertrophic granulation tissue. He also has a blister just distal to the wound on his posterior forearm. He has a new stage III pressure ulcer on his left gluteus with slough accumulation. He apparently spends a substantial portion of his days sitting in a wheelchair. I do not believe he has a Financial trader. Due  to his stroke, he lists to the left side, which is how his ulcer on his elbow developed. I suspect the gluteal ulcer is as a result of this, as well. 06/13/2022: The wound on his left gluteus is nearly healed. It is very superficial and quite clean. His son reports that he has just been applying Desitin to the area. The elbow wound is smaller but still has some hypertrophic granulation tissue and slough accumulation. No concern for infection at either site. 06/27/2022: The buttock wounds are healed. There is some tissue maceration without any significant skin breakdown. The elbow wound continues to contract. There is still some hypertrophic granulation tissue and slough buildup. 07/25/2022: The buttock wounds have reopened. The periwound skin has been kept very dry through liberal use of zinc oxide, however. The elbow wound dressing did not look like it had been changed in at least a week when it was removed. The underlying wound, however has not reaccumulated any hypertrophic granulation tissue; there is a thin layer of slough present. No malodor or purulent drainage from the wound itself. 08/15/2022: There is a tiny opening remaining on the buttocks. It is clean and there is no evidence of tissue maceration. The elbow looks very good. There is good beefy tissue present and it is flush with the surrounding skin. Home health has been coming out. 09/05/2022: The wound on his bottom is healed. The elbow shows signs of pressure induced deep tissue injury and also has some hypertrophic granulation tissue. 09/26/2022: The elbow looks much better today. There is a little bit of slough on the surface but there is no undermining or tunneling. He does have some hypertrophic granulation tissue accumulation. MALAKYE, NOLDEN Jerilynn Mages ([id number]) [id number] Page 6 of 9 Patient History Information obtained from  Patient. Family History Diabetes - Maternal Grandparents, Heart Disease - Mother,  Hypertension - Mother, No family history of Cancer, Hereditary Spherocytosis, Kidney Disease, Lung Disease, Seizures, Stroke, Thyroid Problems, Tuberculosis. Social History Never smoker, Marital Status - Married, Alcohol Use - Never, Drug Use - No History, Caffeine Use - Never. Medical History Eyes Patient has history of Cataracts - removed Cardiovascular Patient has history of Arrhythmia - Atrial Flutter/Atrial Fib, Coronary Artery Disease, Hypotension Denies history of Hypertension Musculoskeletal Patient has history of Osteoarthritis Neurologic Denies history of Paraplegia Hospitalization/Surgery History - open heart surgery. - IR generic histoircal. - appendectomy. - hernia repair. - joint replacement. - mitral valve repair. - mvp repair. - total hip arthroplasty. Medical A Surgical History Notes nd Constitutional Symptoms (General Health) obseity Ear/Nose/Mouth/Throat wears hearing aids Cardiovascular Mitral Valve Repair, Genitourinary BPH Neurologic CVA 2018, left side hemiparesis Objective Constitutional no acute distress. Vitals Time Taken: 2:16 PM, Height: 66 in, Weight: 185 lbs, BMI: 29.9, Temperature: 98.1 F, Pulse: 65 bpm, Respiratory Rate: 18 breaths/min, Blood Pressure: 127/78 mmHg. Respiratory Normal work of breathing on room air. General Notes: 09/26/2022: The elbow looks much better today. There is a little bit of slough on the surface but there is no undermining or tunneling. He does have some hypertrophic granulation tissue accumulation. Integumentary (Hair, Skin) Wound #1 status is Open. Original cause of wound was Pressure Injury. The date acquired was: 12/19/2021. The wound has been in treatment 32 weeks. The wound is located on the Left Elbow. The wound measures 2.6cm length x 3.2cm width x 0.1cm depth; 6.535cm^2 area and 0.653cm^3 volume. There is Fat Layer (Subcutaneous Tissue) exposed. There is a medium amount of serosanguineous drainage noted. The  wound margin is distinct with the outline attached to the wound base. There is large (67-100%) red, hyper - granulation within the wound bed. There is a small (1-33%) amount of necrotic tissue within the wound bed including Adherent Slough. The periwound skin appearance had no abnormalities noted for texture. The periwound skin appearance had no abnormalities noted for moisture. The periwound skin appearance had no abnormalities noted for color. Periwound temperature was noted as No Abnormality. The periwound has tenderness on palpation. Assessment Active Problems ICD-10 Pressure ulcer of left elbow, stage 3 Cerebral infarction due to thrombosis of unspecified precerebral artery Other persistent atrial fibrillation Atherosclerotic heart disease of native coronary artery without angina pectoris [name] ([id number]) [id number] Page 7 of 9 Procedures Wound #1 Pre-procedure diagnosis of Wound #1 is a Pressure Ulcer located on the Left Elbow . There was a Selective/Open Wound Non-Viable Tissue Debridement with a total area of 8.32 sq cm performed by Fredirick Maudlin, MD. With the following instrument(s): Curette to remove Non-Viable tissue/material. Material removed includes [facility] after achieving pain control using Lidocaine 5% topical ointment. No specimens were taken. A time out was conducted at 14:28, prior to the start of the procedure. A Minimum amount of bleeding was controlled with Silver Nitrate. The procedure was tolerated well with a pain level of 0 throughout and a pain level of 0 following the procedure. Post Debridement Measurements: 2.6cm length x 3.2cm width x 0.1cm depth; 0.653cm^3 volume. Post debridement Stage noted as Category/Stage III. Character of Wound/Ulcer Post Debridement is improved. Post procedure Diagnosis Wound #1: Same as Pre-Procedure General Notes: Scribed for Dr. Celine Ahr by J.Scotton. Plan Follow-up Appointments: Return  appointment in 3 weeks. - Dr. Celine Ahr Bathing/ Shower/ Hygiene: May shower and wash wound with soap and water. - with dressing changes Off-Loading: Turn  and reposition every 2 hours - stay off of left elbow wear prevalon boot on elbow as directed Other: - limit time in wheelchair, reposition in wheelchair every 1-2 hours Home Health: No change in wound care orders this week; continue Home Health for wound care. May utilize formulary equivalent dressing for wound treatment orders unless otherwise specified. Dressing changes to be completed by [place] on Monday / Wednesday / Friday except when patient has scheduled visit at [facility]. Other Home Health Orders/Instructions: - [facility] WOUND #1: - Elbow Wound Laterality: Left Cleanser: Soap and Water Every Other Day/30 Days Discharge Instructions: May shower and wash wound with dial antibacterial soap and water prior to dressing change. Cleanser: Wound Cleanser (Generic) Every Other Day/30 Days Discharge Instructions: Cleanse the wound with wound cleanser prior to applying a clean dressing using gauze sponges, not tissue or cotton balls. Prim Dressing: Hydrofera Blue Ready Transfer Foam, 4x5 (in/in) Every Other Day/30 Days ary Discharge Instructions: Apply to wound bed as instructed Secondary Dressing: ALLEVYN Gentle Border, 5x5 (in/in) (Generic) Every Other Day/30 Days Discharge Instructions: Apply over primary dressing as directed. Add-Ons: Cotton Tip Applicator, 6 (in) (Generic) Every Other Day/30 Days 09/26/2022: The elbow looks much better today. There is a little bit of slough on the surface but there is no undermining or tunneling. He does have some hypertrophic granulation tissue accumulation. I used a curette to debride slough off of the wound. I then chemically cauterized the hypertrophic granulation tissue with silver nitrate. We will continue to use Hydrofera Blue with a heel cup and Prevalon boot to pad the site. He will  follow-up in 3 weeks. Electronic Signature(s) Signed: 09/26/2022 2:50:50 PM By: Fredirick Maudlin MD FACS Entered By: Fredirick Maudlin on 09/26/2022 14:50:50 -------------------------------------------------------------------------------- HxROS Details Patient Name: Date of Service: GO RDO Brian [name], DA V ID M. 09/26/2022 2:00 PM Medical Record Number: [id number] Patient Account Number: [id number] Date of Birth/Sex: Treating RN: 1933-06-03 (89 y.o. M) Primary Care Provider: Abelino Derrick Other Clinician: Referring Provider: Treating Provider/Extender: Sandi Raveling in Treatment: 83 Information Obtained From Patient Constitutional Symptoms ([place]) Medical History: Past Medical History Notes: STEELE, STRACENER ([id number]) [id number] Page 8 of 9 Eyes Medical History: Positive for: Cataracts - removed Ear/Nose/Mouth/Throat Medical History: Past Medical History Notes: wears hearing aids Cardiovascular Medical History: Positive for: Arrhythmia - Atrial Flutter/Atrial Fib; Coronary Artery Disease; Hypotension Negative for: Hypertension Past Medical History Notes: Mitral Valve Repair, Genitourinary Medical History: Past Medical History Notes: BPH Musculoskeletal Medical History: Positive for: Osteoarthritis Neurologic Medical History: Negative for: Paraplegia Past Medical History Notes: CVA 2018, left side hemiparesis HBO Extended History Items Eyes: Cataracts Immunizations Pneumococcal Vaccine: Received Pneumococcal Vaccination: Yes Received Pneumococcal Vaccination On or After 60th Birthday: Yes Implantable Devices None Hospitalization / Surgery History Type of Hospitalization/Surgery open heart surgery IR generic histoircal appendectomy hernia repair joint replacement mitral valve repair mvp repair total hip arthroplasty Family and Social History Cancer: No; Diabetes: Yes - Maternal Grandparents;  Heart Disease: Yes - Mother; Hereditary Spherocytosis: No; Hypertension: Yes - Mother; Kidney Disease: No; Lung Disease: No; Seizures: No; Stroke: No; Thyroid Problems: No; Tuberculosis: No; Never smoker; Marital Status - Married; Alcohol Use: Never; Drug Use: No History; Caffeine Use: Never; Financial Concerns: No; Food, Clothing or Shelter Needs: No; Support System Lacking: No; Transportation Concerns: No Electronic Signature(s) Signed: 09/26/2022 3:13:36 PM By: Fredirick Maudlin MD FACS Entered By: Fredirick Maudlin on 09/26/2022 14:48:48 Morado, Kassidy M ([id number]) [id number] Page 9 of 9 -------------------------------------------------------------------------------- SuperBill  Details Patient Name: Date of Service: [name] ID M. 09/26/2022 Medical Record Number: [id number] Patient Account Number: [id number] Date of Birth/Sex: Treating RN: [birth year] (89 y.o. M) Primary Care Provider: Abelino Derrick Other Clinician: Referring Provider: Treating Provider/Extender: Sandi Raveling in Treatment: 32 Diagnosis Coding ICD-10 Codes Code Description [id number] Pressure ulcer of left elbow, stage 3 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 I48.19 Other persistent atrial fibrillation I25.10 Atherosclerotic heart disease of native coronary artery without angina pectoris Facility Procedures : CPT4 Code: [id number] Description: 96759 - DEBRIDE WOUND 1ST 20 SQ CM OR < ICD-10 Diagnosis Description L89.023 Pressure ulcer of left elbow, stage 3 Modifier: Quantity: 1 Physician Procedures : CPT4 Code Description Modifier [id number] - WC PHYS LEVEL 3 - EST PT 25 ICD-10 Diagnosis Description L89.023 Pressure ulcer of left elbow, stage 3 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

## 2022-09-26 NOTE — Progress Notes (Signed)
Brian, [name] Brian [name] ([id number]) [id number] Page 1 of 7 Visit Report for 09/26/2022 Arrival Information Details Patient Name: Date of Service: [name] 09/26/2022 2:00 PM Medical Record Number: [id number] Patient Account Number: [id number] Date of Birth/Sex: Treating RN: 1933-03-01 (89 y.o. Brian [name] Primary Care Lucresia Simic: Abelino Derrick Other Clinician: Referring Kanyon Bunn: Treating Chai Routh/Extender: Sandi Raveling in Treatment: 45 Visit Information History Since Last Visit Added or deleted any medications: No Patient Arrived: Wheel Chair Any new allergies or adverse reactions: No Arrival Time: 14:16 Had a fall or experienced change in No Accompanied By: son activities of daily living that may affect Transfer Assistance: Manual risk of falls: Patient Identification Verified: Yes Signs or symptoms of abuse/neglect since last visito No Patient Requires Transmission-Based Precautions: No Hospitalized since last visit: No Patient Has Alerts: Yes Implantable device outside of the clinic excluding No Patient Alerts: Patient on Blood Thinner cellular tissue based products placed in the center since last visit: Has Dressing in Place as Prescribed: Yes Pain Present Now: Yes Electronic Signature(s) Signed: 09/26/2022 3:35:43 PM By: Dellie Catholic RN Entered By: Dellie Catholic on 09/26/2022 14:16:25 -------------------------------------------------------------------------------- Encounter Discharge Information Details Patient Name: Date of Service: [name], DA V ID [name] 09/26/2022 2:00 PM Medical Record Number: [id number] Patient Account Number: [id number] Date of Birth/Sex: Treating RN: 11/28/1932 (89 y.o. Brian [name] Primary Care Alwaleed Obeso: Abelino Derrick Other Clinician: Referring Heyward Douthit: Treating Tamikia Chowning/Extender: Sandi Raveling in Treatment: 32 Encounter Discharge Information Items  Post Procedure Vitals Discharge Condition: Stable Temperature (F): 98.1 Ambulatory Status: Wheelchair Pulse (bpm): 65 Discharge Destination: Home Respiratory Rate (breaths/min): 18 Transportation: Private Auto Blood Pressure (mmHg): 127/78 Accompanied By: ***s o n Schedule Follow-up Appointment: Yes Clinical Summary of Care: Patient Declined Electronic Signature(s) Signed: 09/26/2022 3:35:43 PM By: Dellie Catholic RN Entered By: Dellie Catholic on 09/26/2022 15:35:05 Reardon, Brian [name] ([id number]) [id number] Page 2 of 7 -------------------------------------------------------------------------------- Lower Extremity Assessment Details Patient Name: Date of Service: [name] 09/26/2022 2:00 PM Medical Record Number: [id number] Patient Account Number: [id number] Date of Birth/Sex: Treating RN: December 05, 1932 (89 y.o. Brian [name] Primary Care Hesham Womac: Abelino Derrick Other Clinician: Referring Arelie Kuzel: Treating Raun Routh/Extender: Sandi Raveling in Treatment: 32 Electronic Signature(s) Signed: 09/26/2022 3:35:43 PM By: Dellie Catholic RN Entered By: Dellie Catholic on 09/26/2022 14:17:17 -------------------------------------------------------------------------------- Multi Wound Chart Details Patient Name: Date of Service: Brian [name], DA V ID [name] 09/26/2022 2:00 PM Medical Record Number: [id number] Patient Account Number: [id number] Date of Birth/Sex: Treating RN: Feb 13, 1933 (89 y.o. [name]) Primary Care Daffney Greenly: Abelino Derrick Other Clinician: Referring Alcario Tinkey: Treating Azara Gemme/Extender: Sandi Raveling in Treatment: 32 Vital Signs Height(in): 66 Pulse(bpm): 22 Weight(lbs): 185 Blood Pressure(mmHg): 127/78 Body Mass Index(BMI): 29.9 Temperature(F): 98.1 Respiratory Rate(breaths/min): 18 [1:Photos:] [N/A:N/A] Left Elbow N/A N/A Wound Location: Pressure Injury N/A N/A Wounding Event: Pressure  Ulcer N/A N/A Primary Etiology: Cataracts, Arrhythmia, Coronary N/A N/A Comorbid History: Artery Disease, Hypotension, Osteoarthritis 12/19/2021 N/A N/A Date Acquired: 6 N/A N/A Weeks of Treatment: Open N/A N/A Wound Status: No N/A N/A Wound Recurrence: Yes N/A N/A Clustered Wound: 2.6x3.2x0.1 N/A N/A Measurements L x W x D (cm) 6.535 N/A N/A A (cm) : rea 0.653 N/A N/A Volume (cm) : 32.10% N/A N/A % Reduction in A rea: 32.10% N/A N/A % Reduction in Volume: Category/Stage III N/A N/A Classification: Medium N/A N/A Exudate A mount: Serosanguineous N/A N/A Exudate Type: red, brown N/A N/A Exudate  Color: Distinct, outline attached N/A N/A Wound Margin: Large (67-100%) N/A N/A Granulation A mount: [name] ([id number]) [id number] Page 3 of 7 Red, Hyper-granulation N/A N/A Granulation Quality: Small (1-33%) N/A N/A Necrotic Amount: Fat Layer (Subcutaneous Tissue): Yes N/A N/A Exposed Structures: Fascia: No Tendon: No Muscle: No Joint: No Bone: No Small (1-33%) N/A N/A Epithelialization: Debridement - Selective/Open Wound N/A N/A Debridement: Pre-procedure Verification/Time Out 14:28 N/A N/A Taken: Lidocaine 5% topical ointment N/A N/A Pain Control: Slough N/A N/A Tissue Debrided: Non-Viable Tissue N/A N/A Level: 8.32 N/A N/A Debridement A (sq cm): rea Curette N/A N/A Instrument: Minimum N/A N/A Bleeding: Silver Nitrate N/A N/A Hemostasis A chieved: 0 N/A N/A Procedural Pain: 0 N/A N/A Post Procedural Pain: Procedure was tolerated well N/A N/A Debridement Treatment Response: 2.6x3.2x0.1 N/A N/A Post Debridement Measurements L x W x D (cm) 0.653 N/A N/A Post Debridement Volume: (cm) Category/Stage III N/A N/A Post Debridement Stage: No Abnormalities Noted N/A N/A Periwound Skin Texture: No Abnormalities Noted N/A N/A Periwound Skin Moisture: No Abnormalities Noted N/A N/A Periwound Skin Color: No  Abnormality N/A N/A Temperature: Yes N/A N/A Tenderness on Palpation: Debridement N/A N/A Procedures Performed: Treatment Notes Electronic Signature(s) Signed: 09/26/2022 2:43:06 PM By: Fredirick Maudlin MD FACS Entered By: Fredirick Maudlin on 09/26/2022 14:43:06 -------------------------------------------------------------------------------- Multi-Disciplinary Care Plan Details Patient Name: Date of Service: [name] 09/26/2022 2:00 PM Medical Record Number: [id number] Patient Account Number: [id number] Date of Birth/Sex: Treating RN: [birth year] (89 y.o. Brian [name] Primary Care Yoni Lobos: Abelino Derrick Other Clinician: Referring Kodah Maret: Treating Xsavier Seeley/Extender: Sandi Raveling in Treatment: 81 Multidisciplinary Care Plan reviewed with physician Active Inactive Abuse / Safety / Falls / Self Care Management Nursing Diagnoses: Impaired physical mobility Potential for falls Goals: Patient/caregiver will verbalize understanding of skin care regimen Date Initiated: 02/14/2022 Target Resolution Date: 02/17/2023 Goal Status: Active Patient/caregiver will verbalize/demonstrate measures taken to prevent injury and/or falls Date Initiated: 02/14/2022 Target Resolution Date: 02/17/2023 Goal Status: Active Interventions: Assess fall risk on admission and as needed Assess self care needs on admission and as needed KACIN, Brian [name] ([id number]) [id number] Page 4 of 7 Notes: Wound/Skin Impairment Nursing Diagnoses: Impaired tissue integrity Knowledge deficit related to ulceration/compromised skin integrity Goals: Patient/caregiver will verbalize understanding of skin care regimen Date Initiated: 02/14/2022 Target Resolution Date: 02/17/2023 Goal Status: Active Ulcer/skin breakdown will have a volume reduction of 30% by week 4 Date Initiated: 02/14/2022 Date Inactivated: 05/23/2022 Target Resolution Date: 05/05/2022 Goal  Status: Unmet Unmet Reason: new PU left glut Interventions: Assess patient/caregiver ability to obtain necessary supplies Assess patient/caregiver ability to perform ulcer/skin care regimen upon admission and as needed Assess ulceration(s) every visit Treatment Activities: Skin care regimen initiated : 02/14/2022 Topical wound management initiated : 02/14/2022 Notes: Electronic Signature(s) Signed: 09/26/2022 3:35:43 PM By: Dellie Catholic RN Entered By: Dellie Catholic on 09/26/2022 14:36:32 -------------------------------------------------------------------------------- Pain Assessment Details Patient Name: Date of Service: Brian [name] 09/26/2022 2:00 PM Medical Record Number: [id number] Patient Account Number: [id number] Date of Birth/Sex: Treating RN: [birth year] (89 y.o. Brian [name] Primary Care Jebidiah Baggerly: Abelino Derrick Other Clinician: Referring Loralei Radcliffe: Treating Johnni Wunschel/Extender: Sandi Raveling in Treatment: 32 Active Problems Location of Pain Severity and Description of Pain Patient Has Paino Yes Site Locations Pain Location: Generalized Pain With Dressing Change: Yes Duration of the Pain. Constant / Intermittento Constant Rate the pain. Current Pain Level: 5 Worst Pain Level: 10 Least Pain Level: 5 Tolerable Pain  Level: 5 Character of Pain Describe the Pain: Difficult to Pinpoint Pain Management and Medication Brian [name], Brian [name] ([id number]) [id number] Page 5 of 7 Current Pain Management: Medication: Yes Cold Application: No Rest: Yes Massage: No Activity: No T.E.N.S.: No Heat Application: No Leg drop or elevation: No Is the Current Pain Management Adequate: Adequate Electronic Signature(s) Signed: 09/26/2022 3:35:43 PM By: Dellie Catholic RN Entered By: Dellie Catholic on 09/26/2022 14:17:09 -------------------------------------------------------------------------------- Patient/Caregiver  Education Details Patient Name: Date of Service: [name] 2/6/2024andnbsp2:00 PM Medical Record Number: [id number] Patient Account Number: [id number] Date of Birth/Gender: Treating RN: [birth year] (89 y.o. Brian [name] Primary Care Physician: Abelino Derrick Other Clinician: Referring Physician: Treating Physician/Extender: Sandi Raveling in Treatment: 9 Education Assessment Education Provided To: Patient Education Topics Provided Wound/Skin Impairment: Methods: Explain/Verbal Responses: Return demonstration correctly Electronic Signature(s) Signed: 09/26/2022 3:35:43 PM By: Dellie Catholic RN Entered By: Dellie Catholic on 09/26/2022 14:36:47 -------------------------------------------------------------------------------- Wound Assessment Details Patient Name: Date of Service: [name] 09/26/2022 2:00 PM Medical Record Number: [id number] Patient Account Number: [id number] Date of Birth/Sex: Treating RN: 1933-07-14 (89 y.o. Brian [name] Primary Care Jana Swartzlander: Abelino Derrick Other Clinician: Referring Brian [name]: Treating Nicandro Perrault/Extender: Sandi Raveling in Treatment: 32 Wound Status Wound Number: 1 Primary Pressure Ulcer Etiology: Wound Location: Left Elbow Wound Status: Open Wounding Event: Pressure Injury Comorbid Cataracts, Arrhythmia, Coronary Artery Disease, Date Acquired: 12/19/2021 History: Hypotension, Osteoarthritis Weeks Of Treatment: 32 Clustered Wound: Yes Photos Brian [name], Brian [name] ([id number]) [id number] Page 6 of 7 Wound Measurements Length: (cm) 2.6 Width: (cm) 3.2 Depth: (cm) 0.1 Area: (cm) 6.535 Volume: (cm) 0.653 % Reduction in Area: 32.1% % Reduction in Volume: 32.1% Epithelialization: Small (1-33%) Wound Description Classification: Category/Stage III Wound Margin: Distinct, outline attached Exudate Amount: Medium Exudate Type:  Serosanguineous Exudate Color: red, brown Foul Odor After Cleansing: No Slough/Fibrino Yes Wound Bed Granulation Amount: Large (67-100%) Exposed Structure Granulation Quality: Red, Hyper-granulation Fascia Exposed: No Necrotic Amount: Small (1-33%) Fat Layer (Subcutaneous Tissue) Exposed: Yes Necrotic Quality: Adherent Slough Tendon Exposed: No Muscle Exposed: No Joint Exposed: No Bone Exposed: No Periwound Skin Texture Texture Color No Abnormalities Noted: Yes No Abnormalities Noted: Yes Moisture Temperature / Pain No Abnormalities Noted: Yes Temperature: No Abnormality Tenderness on Palpation: Yes Treatment Notes Wound #1 (Elbow) Wound Laterality: Left Cleanser Soap and Water Discharge Instruction: May shower and wash wound with dial antibacterial soap and water prior to dressing change. Wound Cleanser Discharge Instruction: Cleanse the wound with wound cleanser prior to applying a clean dressing using gauze sponges, not tissue or cotton balls. Peri-Wound Care Topical Primary Dressing Hydrofera Blue Ready Transfer Foam, 4x5 (in/in) Discharge Instruction: Apply to wound bed as instructed Secondary Dressing ALLEVYN Gentle Border, 5x5 (in/in) Discharge Instruction: Apply over primary dressing as directed. Secured With Compression Wrap Compression Stockings Add-Ons Soil scientist, 6 (in) [employer]) [place], Brian [name] ([id number]) [id number] Page 7 of 7 Signed: 09/26/2022 3:35:43 PM By: Dellie Catholic RN Entered By: Dellie Catholic on 09/26/2022 14:26:37 -------------------------------------------------------------------------------- Vitals Details Patient Name: Date of Service: [name], DA V ID [name] 09/26/2022 2:00 PM Medical Record Number: [id number] Patient Account Number: [id number] Date of Birth/Sex: Treating RN: 1933/04/12 (89 y.o. Brian [name] Primary Care Maximus Hoffert: Abelino Derrick Other Clinician: Referring  Numa Schroeter: Treating Qaadir Kent/Extender: Sandi Raveling in Treatment: 32 Vital Signs Time Taken: 14:16 Temperature (F): 98.1 Height (in): 66 Pulse (bpm): 65 Weight (lbs): 185 Respiratory Rate (breaths/min): 18  Body Mass Index (BMI): 29.9 Blood Pressure (mmHg): 127/78 Reference Range: 80 - 120 mg / dl Electronic Signature(s) Signed: 09/26/2022 3:35:43 PM By: Dellie Catholic RN Entered By: Dellie Catholic on 09/26/2022 14:29:17

## 2022-09-27 ENCOUNTER — Telehealth: Payer: Self-pay

## 2022-09-27 DIAGNOSIS — I4819 Other persistent atrial fibrillation: Secondary | ICD-10-CM | POA: Diagnosis not present

## 2022-09-27 DIAGNOSIS — I69354 Hemiplegia and hemiparesis following cerebral infarction affecting left non-dominant side: Secondary | ICD-10-CM | POA: Diagnosis not present

## 2022-09-27 DIAGNOSIS — I10 Essential (primary) hypertension: Secondary | ICD-10-CM | POA: Diagnosis not present

## 2022-09-27 DIAGNOSIS — I2510 Atherosclerotic heart disease of native coronary artery without angina pectoris: Secondary | ICD-10-CM | POA: Diagnosis not present

## 2022-09-27 DIAGNOSIS — G894 Chronic pain syndrome: Secondary | ICD-10-CM | POA: Diagnosis not present

## 2022-09-27 DIAGNOSIS — Z7901 Long term (current) use of anticoagulants: Secondary | ICD-10-CM | POA: Diagnosis not present

## 2022-09-27 DIAGNOSIS — B372 Candidiasis of skin and nail: Secondary | ICD-10-CM | POA: Diagnosis not present

## 2022-09-27 DIAGNOSIS — I119 Hypertensive heart disease without heart failure: Secondary | ICD-10-CM | POA: Diagnosis not present

## 2022-09-27 DIAGNOSIS — L89159 Pressure ulcer of sacral region, unspecified stage: Secondary | ICD-10-CM | POA: Diagnosis not present

## 2022-09-27 DIAGNOSIS — Z7401 Bed confinement status: Secondary | ICD-10-CM | POA: Diagnosis not present

## 2022-09-27 DIAGNOSIS — L89023 Pressure ulcer of left elbow, stage 3: Secondary | ICD-10-CM | POA: Diagnosis not present

## 2022-09-27 NOTE — Progress Notes (Signed)
Care Management & Coordination Services Pharmacy Team  Reason for Encounter: Hypertension  Contacted patient to discuss hypertension disease state.  Spoke with patient on 09/27/2022     Current antihypertensive regimen:  Losartan 25 mg daily  Metoprolol XL 25 mg daily   Patient verbally confirms he is taking the above medications as directed. Yes  How often are you checking your Blood Pressure? daily  he checks his blood pressure in the morning before taking his medication.  Current home BP readings:   Patient home health nurse states patient blood pressure was 100/80 today.  Wrist or arm cuff: Patient states he use arm cuff. Caffeine intake: Patient denies caffeine intake. Salt intake: Patient denies any salt intake.   Any readings above 180/100? No   What recent interventions/DTPs have been made by any provider to improve Blood Pressure control since last CPP Visit: none ID  Any recent hospitalizations or ED visits since last visit with CPP? No  What diet changes have been made to improve Blood Pressure Control?  Patient denies any changes.  What exercise is being done to improve your Blood Pressure Control?  Patient denies any changes.  Adherence Review: Is the patient currently on ACE/ARB medication? No Does the patient have >5 day gap between last estimated fill dates? No  Star Rating Drugs:  Medication: Pravastatin 40 mg Last Fill: 07/28/2022 90 Day Supply at [facility].   Chart Updates: Recent office visits:  None ID  Recent consult visits:  09/26/2022 Dr. Celine Ahr MD (General Surgery) No medication changes noted 09/12/2022 Dr. Holley Raring MD (Pain Medicine) No medication changes noted 09/05/2022  Dr. Celine Ahr MD (General Surgery)No medication changes noted 08/15/2022  Dr. Celine Ahr MD (General Surgery)No medication changes noted 07/25/2022 Dr. Celine Ahr MD (General Surgery)No medication changes noted 06/27/2022 Dr. Celine Ahr MD (General Surgery) No medication  changes noted  Hospital visits:  None in previous 6 months  Medications: Outpatient Encounter Medications as of 09/27/2022  Medication Sig   ALPRAZolam (XANAX) 0.25 MG tablet Take 0.25 mg by mouth 2 (two) times daily as needed for anxiety.   cetirizine (ZYRTEC) 10 MG tablet TAKE 1 TABLET(10 MG) BY MOUTH AT BEDTIME   ELIQUIS 2.5 MG TABS tablet TAKE 1 TABLET(2.5 MG) BY MOUTH TWICE DAILY   finasteride (PROSCAR) 5 MG tablet TAKE 1 TABLET(5 MG) BY MOUTH DAILY   fluticasone (FLONASE) 50 MCG/ACT nasal spray Place 1 spray into both nostrils daily as needed for allergies.   hydrocortisone ointment 0.5 % Apply 1 Application topically daily as needed for itching.   melatonin 5 MG TABS Take 5 mg by mouth at bedtime as needed (sleep).   metoprolol succinate (TOPROL-XL) 25 MG 24 hr tablet TAKE 1 TABLET(25 MG) BY MOUTH DAILY (Patient taking differently: Take 25 mg by mouth daily.)   Multiple Vitamin (MULTIVITAMIN) tablet Take 1 tablet by mouth 2 (two) times daily.   MUPIROCIN EX Apply 1 Application topically daily as needed (wound care).   oxyCODONE-acetaminophen (PERCOCET) 7.5-325 MG tablet Take 1 tablet by mouth every 12 (twelve) hours as needed for severe pain.   [START ON 10/17/2022] oxyCODONE-acetaminophen (PERCOCET) 7.5-325 MG tablet Take 1 tablet by mouth every 12 (twelve) hours as needed for severe pain.   [START ON 11/16/2022] oxyCODONE-acetaminophen (PERCOCET) 7.5-325 MG tablet Take 1 tablet by mouth every 12 (twelve) hours as needed for severe pain.   pantoprazole (PROTONIX) 40 MG tablet TAKE 1 TABLET(40 MG) BY MOUTH DAILY   polyethylene glycol powder (GLYCOLAX/MIRALAX) 17 GM/SCOOP powder Mix one scoop with  water and take daily until stools loosen. May repeat as needed. (Patient taking differently: Take 17 g by mouth daily as needed for mild constipation.)   pravastatin (PRAVACHOL) 40 MG tablet TAKE 1 TABLET(40 MG) BY MOUTH DAILY (Patient taking differently: Take 40 mg by mouth daily.)   [START ON  10/20/2022] pregabalin (LYRICA) 25 MG capsule Take 1 capsule (25 mg total) by mouth at bedtime.   QUEtiapine (SEROQUEL) 50 MG tablet TAKE 1 TABLET(50 MG) BY MOUTH AT BEDTIME   sertraline (ZOLOFT) 50 MG tablet TAKE 1 TABLET(50 MG) BY MOUTH EVERY MORNING (Patient taking differently: Take 50 mg by mouth daily.)   tamsulosin (FLOMAX) 0.4 MG CAPS capsule TAKE 1 CAPSULE(0.4 MG) BY MOUTH DAILY   No facility-administered encounter medications on file as of 09/27/2022.    Recent Office Vitals: BP Readings from Last 3 Encounters:  09/12/22 122/62  06/06/22 (!) 123/46  05/18/22 118/76   Pulse Readings from Last 3 Encounters:  09/12/22 (!) 53  06/06/22 61  05/18/22 71    Wt Readings from Last 3 Encounters:  09/12/22 185 lb (83.9 kg)  06/06/22 185 lb (83.9 kg)  04/11/22 150 lb (68 kg)     Kidney Function Lab Results  Component Value Date/Time   CREATININE 0.98 03/14/2022 07:04 PM   CREATININE 0.68 02/28/2022 05:04 PM   CREATININE 0.63 (L) 02/06/2020 05:06 PM   CREATININE 0.64 (L) 10/03/2016 11:57 AM   GFR 84.36 11/01/2021 03:28 PM   GFRNONAA >60 03/14/2022 07:04 PM   GFRNONAA >60 01/23/2011 09:20 AM   GFRAA >60 11/03/2018 07:35 PM   GFRAA >60 01/23/2011 09:20 AM       Latest Ref Rng & Units 03/14/2022    7:04 PM 02/28/2022    5:04 PM 11/01/2021    3:28 PM  BMP  Glucose 70 - 99 mg/dL 115  95  126   BUN 8 - 23 mg/dL 18  15  15   $ Creatinine 0.61 - 1.24 mg/dL 0.98  0.68  0.67   Sodium 135 - 145 mmol/L 137  139  139   Potassium 3.5 - 5.1 mmol/L 3.7  4.5  4.3   Chloride 98 - 111 mmol/L 104  104  105   CO2 22 - 32 mmol/L 26  25  30   $ Calcium 8.9 - 10.3 mg/dL 8.8  9.0  9.0      [facility] Clinical Pharmacist Assistant [phone]

## 2022-09-29 DIAGNOSIS — I2510 Atherosclerotic heart disease of native coronary artery without angina pectoris: Secondary | ICD-10-CM | POA: Diagnosis not present

## 2022-09-29 DIAGNOSIS — I4819 Other persistent atrial fibrillation: Secondary | ICD-10-CM | POA: Diagnosis not present

## 2022-09-29 DIAGNOSIS — I69354 Hemiplegia and hemiparesis following cerebral infarction affecting left non-dominant side: Secondary | ICD-10-CM | POA: Diagnosis not present

## 2022-09-29 DIAGNOSIS — Z7901 Long term (current) use of anticoagulants: Secondary | ICD-10-CM | POA: Diagnosis not present

## 2022-09-29 DIAGNOSIS — L89023 Pressure ulcer of left elbow, stage 3: Secondary | ICD-10-CM | POA: Diagnosis not present

## 2022-09-29 DIAGNOSIS — I119 Hypertensive heart disease without heart failure: Secondary | ICD-10-CM | POA: Diagnosis not present

## 2022-10-01 ENCOUNTER — Other Ambulatory Visit: Payer: Self-pay | Admitting: Family Medicine

## 2022-10-03 DIAGNOSIS — I4819 Other persistent atrial fibrillation: Secondary | ICD-10-CM | POA: Diagnosis not present

## 2022-10-03 DIAGNOSIS — Z7901 Long term (current) use of anticoagulants: Secondary | ICD-10-CM | POA: Diagnosis not present

## 2022-10-03 DIAGNOSIS — I69354 Hemiplegia and hemiparesis following cerebral infarction affecting left non-dominant side: Secondary | ICD-10-CM | POA: Diagnosis not present

## 2022-10-03 DIAGNOSIS — L89023 Pressure ulcer of left elbow, stage 3: Secondary | ICD-10-CM | POA: Diagnosis not present

## 2022-10-03 DIAGNOSIS — I2510 Atherosclerotic heart disease of native coronary artery without angina pectoris: Secondary | ICD-10-CM | POA: Diagnosis not present

## 2022-10-03 DIAGNOSIS — I119 Hypertensive heart disease without heart failure: Secondary | ICD-10-CM | POA: Diagnosis not present

## 2022-10-05 DIAGNOSIS — Z7901 Long term (current) use of anticoagulants: Secondary | ICD-10-CM | POA: Diagnosis not present

## 2022-10-05 DIAGNOSIS — I4819 Other persistent atrial fibrillation: Secondary | ICD-10-CM | POA: Diagnosis not present

## 2022-10-05 DIAGNOSIS — I119 Hypertensive heart disease without heart failure: Secondary | ICD-10-CM | POA: Diagnosis not present

## 2022-10-05 DIAGNOSIS — I69354 Hemiplegia and hemiparesis following cerebral infarction affecting left non-dominant side: Secondary | ICD-10-CM | POA: Diagnosis not present

## 2022-10-05 DIAGNOSIS — L89023 Pressure ulcer of left elbow, stage 3: Secondary | ICD-10-CM | POA: Diagnosis not present

## 2022-10-05 DIAGNOSIS — I2510 Atherosclerotic heart disease of native coronary artery without angina pectoris: Secondary | ICD-10-CM | POA: Diagnosis not present

## 2022-10-06 DIAGNOSIS — I2510 Atherosclerotic heart disease of native coronary artery without angina pectoris: Secondary | ICD-10-CM | POA: Diagnosis not present

## 2022-10-06 DIAGNOSIS — L89023 Pressure ulcer of left elbow, stage 3: Secondary | ICD-10-CM | POA: Diagnosis not present

## 2022-10-06 DIAGNOSIS — I69354 Hemiplegia and hemiparesis following cerebral infarction affecting left non-dominant side: Secondary | ICD-10-CM | POA: Diagnosis not present

## 2022-10-06 DIAGNOSIS — I4819 Other persistent atrial fibrillation: Secondary | ICD-10-CM | POA: Diagnosis not present

## 2022-10-06 DIAGNOSIS — I119 Hypertensive heart disease without heart failure: Secondary | ICD-10-CM | POA: Diagnosis not present

## 2022-10-06 DIAGNOSIS — Z7901 Long term (current) use of anticoagulants: Secondary | ICD-10-CM | POA: Diagnosis not present

## 2022-10-09 DIAGNOSIS — N401 Enlarged prostate with lower urinary tract symptoms: Secondary | ICD-10-CM | POA: Diagnosis not present

## 2022-10-09 DIAGNOSIS — R300 Dysuria: Secondary | ICD-10-CM | POA: Diagnosis not present

## 2022-10-09 DIAGNOSIS — N3020 Other chronic cystitis without hematuria: Secondary | ICD-10-CM | POA: Diagnosis not present

## 2022-10-09 DIAGNOSIS — R3914 Feeling of incomplete bladder emptying: Secondary | ICD-10-CM | POA: Diagnosis not present

## 2022-10-10 ENCOUNTER — Telehealth: Payer: Self-pay | Admitting: Family Medicine

## 2022-10-10 DIAGNOSIS — Z7901 Long term (current) use of anticoagulants: Secondary | ICD-10-CM | POA: Diagnosis not present

## 2022-10-10 DIAGNOSIS — I69354 Hemiplegia and hemiparesis following cerebral infarction affecting left non-dominant side: Secondary | ICD-10-CM | POA: Diagnosis not present

## 2022-10-10 DIAGNOSIS — L89023 Pressure ulcer of left elbow, stage 3: Secondary | ICD-10-CM | POA: Diagnosis not present

## 2022-10-10 DIAGNOSIS — I4819 Other persistent atrial fibrillation: Secondary | ICD-10-CM | POA: Diagnosis not present

## 2022-10-10 DIAGNOSIS — I2510 Atherosclerotic heart disease of native coronary artery without angina pectoris: Secondary | ICD-10-CM | POA: Diagnosis not present

## 2022-10-10 DIAGNOSIS — I119 Hypertensive heart disease without heart failure: Secondary | ICD-10-CM | POA: Diagnosis not present

## 2022-10-10 NOTE — Telephone Encounter (Signed)
Pt wants to know if he should have a shingles shot. It had 10/23/2022 so I wasn't sure what to tell him. Please call.

## 2022-10-10 NOTE — Telephone Encounter (Signed)
Spoke with patient who verbally understood he will need to have shingles vaccine at his pharmacy

## 2022-10-10 NOTE — Telephone Encounter (Signed)
Caller states: -She received a referral from [employer], Truitt Merle, Director - In order for them to be eligible for palliative care they need to either have active cancer diagnosis, 12 month life expectancy, Chronic illness, or frequent ED/Hospital admissions  - If they have any of the above, she needs confirmation from PCP on if he agrees with referral   For more information or to confirm, Marzetta Board can be reached at [phone] option 2

## 2022-10-11 ENCOUNTER — Telehealth: Payer: Self-pay | Admitting: Family Medicine

## 2022-10-11 NOTE — Telephone Encounter (Signed)
Brian [name] from [facility]  Verbal orders expanded PT starting 3/4 2wx2

## 2022-10-11 NOTE — Telephone Encounter (Signed)
Verbal given, written order will be faxed.

## 2022-10-12 ENCOUNTER — Telehealth: Payer: Self-pay | Admitting: Family Medicine

## 2022-10-12 DIAGNOSIS — Z7901 Long term (current) use of anticoagulants: Secondary | ICD-10-CM | POA: Diagnosis not present

## 2022-10-12 DIAGNOSIS — I2510 Atherosclerotic heart disease of native coronary artery without angina pectoris: Secondary | ICD-10-CM | POA: Diagnosis not present

## 2022-10-12 DIAGNOSIS — I4819 Other persistent atrial fibrillation: Secondary | ICD-10-CM | POA: Diagnosis not present

## 2022-10-12 DIAGNOSIS — I119 Hypertensive heart disease without heart failure: Secondary | ICD-10-CM | POA: Diagnosis not present

## 2022-10-12 DIAGNOSIS — I69354 Hemiplegia and hemiparesis following cerebral infarction affecting left non-dominant side: Secondary | ICD-10-CM | POA: Diagnosis not present

## 2022-10-12 DIAGNOSIS — L89023 Pressure ulcer of left elbow, stage 3: Secondary | ICD-10-CM | POA: Diagnosis not present

## 2022-10-12 NOTE — Telephone Encounter (Signed)
Brian [name] # [phone] from [facility] called and stated she need a palliative referral for the pt

## 2022-10-13 NOTE — Telephone Encounter (Signed)
Duplicate message referral approval or denial pending until PCP is back in the office.

## 2022-10-14 DIAGNOSIS — I4819 Other persistent atrial fibrillation: Secondary | ICD-10-CM | POA: Diagnosis not present

## 2022-10-14 DIAGNOSIS — I119 Hypertensive heart disease without heart failure: Secondary | ICD-10-CM | POA: Diagnosis not present

## 2022-10-14 DIAGNOSIS — I2510 Atherosclerotic heart disease of native coronary artery without angina pectoris: Secondary | ICD-10-CM | POA: Diagnosis not present

## 2022-10-14 DIAGNOSIS — I69354 Hemiplegia and hemiparesis following cerebral infarction affecting left non-dominant side: Secondary | ICD-10-CM | POA: Diagnosis not present

## 2022-10-14 DIAGNOSIS — Z7901 Long term (current) use of anticoagulants: Secondary | ICD-10-CM | POA: Diagnosis not present

## 2022-10-14 DIAGNOSIS — L89023 Pressure ulcer of left elbow, stage 3: Secondary | ICD-10-CM | POA: Diagnosis not present

## 2022-10-16 NOTE — Telephone Encounter (Signed)
Please advise message below if agreed on referral palliative care.

## 2022-10-17 ENCOUNTER — Encounter (HOSPITAL_BASED_OUTPATIENT_CLINIC_OR_DEPARTMENT_OTHER): Payer: Medicare Other | Admitting: General Surgery

## 2022-10-17 DIAGNOSIS — M1990 Unspecified osteoarthritis, unspecified site: Secondary | ICD-10-CM | POA: Diagnosis not present

## 2022-10-17 DIAGNOSIS — Z7901 Long term (current) use of anticoagulants: Secondary | ICD-10-CM | POA: Diagnosis not present

## 2022-10-17 DIAGNOSIS — I2510 Atherosclerotic heart disease of native coronary artery without angina pectoris: Secondary | ICD-10-CM | POA: Diagnosis not present

## 2022-10-17 DIAGNOSIS — I4819 Other persistent atrial fibrillation: Secondary | ICD-10-CM | POA: Diagnosis not present

## 2022-10-17 DIAGNOSIS — I10 Essential (primary) hypertension: Secondary | ICD-10-CM | POA: Diagnosis not present

## 2022-10-17 DIAGNOSIS — L89023 Pressure ulcer of left elbow, stage 3: Secondary | ICD-10-CM | POA: Diagnosis not present

## 2022-10-17 DIAGNOSIS — Z833 Family history of diabetes mellitus: Secondary | ICD-10-CM | POA: Diagnosis not present

## 2022-10-17 DIAGNOSIS — I119 Hypertensive heart disease without heart failure: Secondary | ICD-10-CM | POA: Diagnosis not present

## 2022-10-17 DIAGNOSIS — I69354 Hemiplegia and hemiparesis following cerebral infarction affecting left non-dominant side: Secondary | ICD-10-CM | POA: Diagnosis not present

## 2022-10-17 NOTE — Telephone Encounter (Signed)
Returned Stacy's call to inform that per Dr. Ethelene Hal we will hold on palliative referral for now. NO answer LM on VM

## 2022-10-18 DIAGNOSIS — I119 Hypertensive heart disease without heart failure: Secondary | ICD-10-CM | POA: Diagnosis not present

## 2022-10-18 DIAGNOSIS — L89023 Pressure ulcer of left elbow, stage 3: Secondary | ICD-10-CM | POA: Diagnosis not present

## 2022-10-18 DIAGNOSIS — I69354 Hemiplegia and hemiparesis following cerebral infarction affecting left non-dominant side: Secondary | ICD-10-CM | POA: Diagnosis not present

## 2022-10-18 DIAGNOSIS — Z7901 Long term (current) use of anticoagulants: Secondary | ICD-10-CM | POA: Diagnosis not present

## 2022-10-18 DIAGNOSIS — I4819 Other persistent atrial fibrillation: Secondary | ICD-10-CM | POA: Diagnosis not present

## 2022-10-18 DIAGNOSIS — I2510 Atherosclerotic heart disease of native coronary artery without angina pectoris: Secondary | ICD-10-CM | POA: Diagnosis not present

## 2022-10-18 NOTE — Progress Notes (Signed)
Brian Brian [name], Brian Brian [name] ([id number]) [id number] Page 1 of 8 Visit Report for 10/17/2022 Chief Complaint Document Details Patient Name: Date of Service: Brian Brian [name] ID Brian [name] 10/17/2022 3:00 PM Medical Record Number: [id number] Patient Account Number: [id number] Date of Birth/Sex: Treating RN: May 26, 1933 (89 y.o. Brian [name]) Primary Care Provider: Abelino Brian [name] Other Clinician: Referring Provider: Treating Provider/Extender: Brian Brian [name] in Treatment: 35 Information Obtained from: Patient Chief Complaint 08/05/2019; patient is here for review of pressure ulcers on his buttock 02/14/22: patient is here for review of pressure ulcers on his left elbow Electronic Signature(s) Signed: 10/17/2022 3:58:45 PM By: Brian Maudlin MD FACS Entered By: Brian Brian [name] on 10/17/2022 15:58:45 -------------------------------------------------------------------------------- HPI Details Patient Name: Date of Service: Brian Brian Brian [name], Brian Brian [name] ID Brian [name] 10/17/2022 3:00 PM Medical Record Number: [id number] Patient Account Number: [id number] Date of Birth/Sex: Treating RN: January 20, 1933 (89 y.o. Brian [name]) Primary Care Provider: Abelino Brian [name] Other Clinician: Referring Provider: Treating Provider/Extender: Brian Brian [name] in Treatment: 34 History of Present Illness HPI Description: ADMISSION 08/05/19 This is an 89 year old man who arrives in clinic accompanied by his son. 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 Apparently noted recently to have a stage I pressure area on the left buttock. He does not have a wound history. He does have been air fluidized mattress. They have a wheelchair cushion as well as a pillow over the top of this. He is not incontinent of urine or bowel. Past medical history includes hypertension, osteoarthritis of the knee, atrial fibrillation, history of mitral valve repair, right hemisphere CVA with  left hemiparesis, history of a DVT READMISSION 02/14/2022 The patient is here today for evaluation of pressure ulcers on his left elbow and possible right buttock ulcer. Apparently when he was seen by Dr. Dellia Nims in 2020, no ulcer was appreciated on the buttock and no further wound care involvement occurred. 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 He does have home health aides and they are turning him every 2 hours side to side. He is on a memory foam mattress at this time. 03/14/2022: The patient was scheduled to see me on July 11, but apparently his son panicked about some bright red blood per rectum and rushed him to the emergency room. There is also a comment in the electronic medical record that the patient's son thought the bone was exposed on his elbow. After waiting a prolonged period of time at the emergency department, they left without being seen. Today, it appears that the dressing on the patient's elbow was never changed since our last visit. The 2 wounds have converged creating a larger wound. According to the intake nurse, there was a foul odor from the wound and dressing. There is extensive slough on the wound surface. The bleeding per rectum turns out to be hemorrhoids. The patient's son expresses that he does not know how to manage this. 04/07/2022: For unclear reasons, the patient has not returned to clinic until they were contacted today and they made an add-on visit. The patient's son was not originally present at the beginning of the visit but showed up about 25 minutes into the visit. The patient was accompanied by a home aide. She reported that when she cleaned the patient up today, there was just a Band-Aid on his wound. She said the surface was  fairly mucky but she was able to wash this off. It is abundantly clear that this wound is not being properly  cared for. Apparently he did have [facility] home health but they have not been out in some time and it is not clear the reason for this. The surface of the wound is a bit cleaner so perhaps the Santyl has been applied, but unfortunately there is now a deep tunnel that extends up from the elbow to the patient's posterior upper arm for a number centimeters. There is no evidence of infection or purulent drainage. It is also clear that the patient continues to spend a substantial portion of his time leaning on the elbow. 05/23/2022: Once again, the patient has failed to return to clinic for about 6 weeks and the reason remains unclear. He is apparently receiving home health assistance. The wound on his elbow is covered with hypertrophic granulation tissue. He also has a blister just distal to the wound on his posterior forearm. He has a new stage III pressure ulcer on his left gluteus with slough accumulation. He apparently spends a substantial portion of his days sitting in a wheelchair. I do not believe he has a Financial trader. Due to his stroke, he lists to the left side, which is how his ulcer on his elbow developed. I suspect the gluteal ulcer is as a result of this, as well. 06/13/2022: The wound on his left gluteus is nearly healed. It is very superficial and quite clean. His son reports that he has just been applying Desitin to the area. The elbow wound is smaller but still has some hypertrophic granulation tissue and slough accumulation. No concern for infection at either site. Brian [name], Brian [name] ([id number]) [id number] Page 2 of 8 06/27/2022: The buttock wounds are healed. There is some tissue maceration without any significant skin breakdown. The elbow wound continues to contract. There is still some hypertrophic granulation tissue and slough buildup. 07/25/2022: The buttock wounds have reopened. The periwound skin has been kept very dry through liberal use of zinc oxide,  however. The elbow wound dressing did not look like it had been changed in at least a week when it was removed. The underlying wound, however has not reaccumulated any hypertrophic granulation tissue; there is a thin layer of slough present. No malodor or purulent drainage from the wound itself. 08/15/2022: There is a tiny opening remaining on the buttocks. It is clean and there is no evidence of tissue maceration. The elbow looks very good. There is good beefy tissue present and it is flush with the surrounding skin. Home health has been coming out. 09/05/2022: The wound on his bottom is healed. The elbow shows signs of pressure induced deep tissue injury and also has some hypertrophic granulation tissue. 09/26/2022: The elbow looks much better today. There is a little bit of slough on the surface but there is no undermining or tunneling. He does have some hypertrophic granulation tissue accumulation. 10/17/2022: His elbow wound continues to improve. It is smaller, cleaner, and more superficial. There is hypertrophic granulation tissue present. Electronic Signature(s) Signed: 10/17/2022 3:59:21 PM By: Brian Maudlin MD FACS Entered By: Brian Brian [name] on 10/17/2022 15:59:21 -------------------------------------------------------------------------------- Chemical Cauterization Details Patient Name: Date of Service: Brian Brian [name] ID Brian [name] 10/17/2022 3:00 PM Medical Record Number: [id number] Patient Account Number: [id number] Date of Birth/Sex: Treating RN: [birth year] (89 y.o. Valene Bors Primary Care Provider: Abelino Brian [name] Other Clinician: Referring Provider: Treating Provider/Extender: Brian Brian [name] in Treatment:  35 Procedure Performed for: Wound #1 Left Elbow Performed By: Physician Brian Maudlin, MD Post Procedure Diagnosis Same as Pre-procedure Electronic Signature(s) Signed: 10/17/2022 4:01:45 PM By: Brian Maudlin MD FACS Signed: 10/17/2022 4:22:01 PM  By: Antoine Primas Entered By: Antoine Primas on 10/17/2022 15:36:34 -------------------------------------------------------------------------------- Physical Exam Details Patient Name: Date of Service: Brian Brian [name] ID Brian [name] 10/17/2022 3:00 PM Medical Record Number: [id number] Patient Account Number: [id number] Date of Birth/Sex: Treating RN: 30-Dec-1932 (89 y.o. Brian [name]) Primary Care Provider: Abelino Brian [name] Other Clinician: Referring Provider: Treating Provider/Extender: Brian Brian [name] in Treatment: 27 Constitutional Hypertensive, asymptomatic. Slightly tachycardic. . . no acute distress. Respiratory Normal work of breathing on room air. Notes 10/17/2022: His elbow wound continues to improve. It is smaller, cleaner, and more superficial. There is hypertrophic granulation tissue present. Electronic Signature(s) Signed: 10/17/2022 3:59:54 PM By: Brian Maudlin MD FACS Entered By: Brian Brian [name] on 10/17/2022 15:59:54 Brian Brian [name], Brian Brian [name] ([id number]) [phone].pdf Page 3 of 8 -------------------------------------------------------------------------------- Physician Orders Details Patient Name: Date of Service: Brian Brian [name] ID Brian [name] 10/17/2022 3:00 PM Medical Record Number: [id number] Patient Account Number: [id number] Date of Birth/Sex: Treating RN: [birth year] (89 y.o. [name] Primary Care Provider: Abelino Brian [name] Other Clinician: Referring Provider: Treating Provider/Extender: Brian Brian [name] in Treatment: [phone] Verbal / Phone Orders: No Diagnosis Coding ICD-10 Coding Code Description L89.023 Pressure ulcer of left elbow, stage 3 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 - Dr. Celine Ahr Room  3 Tuesday March 19th at [facility] Hygiene May shower and wash wound with soap and water. - with dressing changes Off-Loading Turn and reposition every 2 hours - stay off of left elbow wear prevalon boot on elbow as directed Other: - limit time in wheelchair, reposition in wheelchair every 1-2 hours Home Health No change in wound care orders this week; continue Home Health for wound care. May utilize formulary equivalent dressing for wound treatment orders unless otherwise specified. Dressing changes to be completed by [place] on Monday / Wednesday / Friday except when patient has scheduled visit at [facility]. Other Home Health Orders/Instructions: - [facility] Wound Treatment Wound #1 - Elbow Wound Laterality: Left Cleanser: Soap and Water Every Other Day/30 Days Discharge Instructions: May shower and wash wound with dial antibacterial soap and water prior to dressing change. Cleanser: Wound Cleanser (Generic) Every Other Day/30 Days Discharge Instructions: Cleanse the wound with wound cleanser prior to applying a clean dressing using gauze sponges, not tissue or cotton balls. Prim Dressing: Hydrofera Blue Ready Transfer Foam, 4x5 (in/in) Every Other Day/30 Days ary Discharge Instructions: Apply to wound bed as instructed Secondary Dressing: ALLEVYN Gentle Border, 5x5 (in/in) (Generic) Every Other Day/30 Days Discharge Instructions: Apply over primary dressing as directed. Add-Ons: Cotton Tip Applicator, 6 (in) (Generic) Every Other Day/30 Days Electronic Signature(s) Signed: 10/17/2022 4:01:45 PM By: Brian Maudlin MD FACS Entered By: Brian Brian [name] on 10/17/2022 16:00:09 -------------------------------------------------------------------------------- Problem List Details Patient Name: Date of Service: Brian Brian [name] ID Brian [name] 10/17/2022 3:00 PM Medical Record Number: [id number] Patient Account Number: [id number] Date of Birth/Sex: Treating RN: 1932-12-11 (89 y.o.  Brian [name]) Primary Care Provider: Abelino Brian [name] Other Clinician: Referring Provider: Treating Provider/Extender: Brian Brian [name] in Treatment: [address] [place], Salvator Jerilynn Mages ([id number]) [id number] Page 4 of 8 ICD-10 Encounter Code Description Active  Date MDM Diagnosis L89.023 Pressure ulcer of left elbow, stage 3 02/14/2022 No Yes 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 02/14/2022 No Yes I48.19 Other persistent atrial fibrillation 02/14/2022 No Yes I25.10 Atherosclerotic heart disease of native coronary artery without angina pectoris 02/14/2022 No Yes Inactive Problems ICD-10 Code Description Active Date Inactive Date L89.323 Pressure ulcer of left buttock, stage 3 05/23/2022 05/23/2022 Resolved Problems Electronic Signature(s) Signed: 10/17/2022 3:58:31 PM By: Brian Maudlin MD FACS Entered By: Brian Brian [name] on 10/17/2022 15:58:31 -------------------------------------------------------------------------------- Progress Note Details Patient Name: Date of Service: Brian Brian Brian [name], Brian Brian [name] ID Brian [name] 10/17/2022 3:00 PM Medical Record Number: [id number] Patient Account Number: [id number] Date of Birth/Sex: Treating RN: [birth year] (89 y.o. Brian [name]) Primary Care Provider: Abelino Brian [name] Other Clinician: Referring Provider: Treating Provider/Extender: Brian Brian [name] in Treatment: 35 Subjective Chief Complaint Information obtained from Patient 08/05/2019; patient is here for review of pressure ulcers on his buttock 02/14/22: patient is here for review of pressure ulcers on his left elbow History of Present Illness (HPI) ADMISSION 08/05/19 This is an 89 year old man who arrives in clinic accompanied by his son. 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 Apparently noted recently to have a stage I pressure area on the left buttock. He does not have a wound  history. He does have been air fluidized mattress. They have a wheelchair cushion as well as a pillow over the top of this. He is not incontinent of urine or bowel. Past medical history includes hypertension, osteoarthritis of the knee, atrial fibrillation, history of mitral valve repair, right hemisphere CVA with left hemiparesis, history of a DVT READMISSION 02/14/2022 The patient is here today for evaluation of pressure ulcers on his left elbow and possible right buttock ulcer. Apparently when he was seen by Dr. Dellia Nims in 2020, no ulcer was appreciated on the buttock and no further wound care involvement occurred. 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 He does have home health aides and they are turning him every 2 hours side to side. He is on a memory foam mattress at this time. 03/14/2022: The patient was scheduled to see me on July 11, but apparently his son panicked about some bright red blood per rectum and rushed him to the emergency room. There is also a comment in the electronic medical record that the patient's son thought the bone was exposed on his elbow. After waiting a prolonged period of time at the emergency department, they left without being seen. Today, it appears that the dressing on the patient's elbow was never changed since our last visit. The 2 wounds have converged creating a larger wound. According to the intake nurse, there was a foul odor from the wound and dressing. There is extensive slough on the wound surface. The bleeding per rectum turns out to be hemorrhoids. The patient's son expresses that he does not know how to manage this. 04/07/2022: For unclear reasons, the patient has not returned to clinic until they were contacted today and they made an add-on visit. The patient's son was not Brian Brian [name], Brian Brian [name] ([id number])  [id number] Page 5 of 8 originally present at the beginning of the visit but showed up about 25 minutes into the visit. The patient was accompanied by a home aide. She reported that when she cleaned the patient up today,  there was just a Band-Aid on his wound. She said the surface was fairly mucky but she was able to wash this off. It is abundantly clear that this wound is not being properly cared for. Apparently he did have [facility] home health but they have not been out in some time and it is not clear the reason for this. The surface of the wound is a bit cleaner so perhaps the Santyl has been applied, but unfortunately there is now a deep tunnel that extends up from the elbow to the patient's posterior upper arm for a number centimeters. There is no evidence of infection or purulent drainage. It is also clear that the patient continues to spend a substantial portion of his time leaning on the elbow. 05/23/2022: Once again, the patient has failed to return to clinic for about 6 weeks and the reason remains unclear. He is apparently receiving home health assistance. The wound on his elbow is covered with hypertrophic granulation tissue. He also has a blister just distal to the wound on his posterior forearm. He has a new stage III pressure ulcer on his left gluteus with slough accumulation. He apparently spends a substantial portion of his days sitting in a wheelchair. I do not believe he has a Financial trader. Due to his stroke, he lists to the left side, which is how his ulcer on his elbow developed. I suspect the gluteal ulcer is as a result of this, as well. 06/13/2022: The wound on his left gluteus is nearly healed. It is very superficial and quite clean. His son reports that he has just been applying Desitin to the area. The elbow wound is smaller but still has some hypertrophic granulation tissue and slough accumulation. No concern for infection at either  site. 06/27/2022: The buttock wounds are healed. There is some tissue maceration without any significant skin breakdown. The elbow wound continues to contract. There is still some hypertrophic granulation tissue and slough buildup. 07/25/2022: The buttock wounds have reopened. The periwound skin has been kept very dry through liberal use of zinc oxide, however. The elbow wound dressing did not look like it had been changed in at least a week when it was removed. The underlying wound, however has not reaccumulated any hypertrophic granulation tissue; there is a thin layer of slough present. No malodor or purulent drainage from the wound itself. 08/15/2022: There is a tiny opening remaining on the buttocks. It is clean and there is no evidence of tissue maceration. The elbow looks very good. There is good beefy tissue present and it is flush with the surrounding skin. Home health has been coming out. 09/05/2022: The wound on his bottom is healed. The elbow shows signs of pressure induced deep tissue injury and also has some hypertrophic granulation tissue. 09/26/2022: The elbow looks much better today. There is a little bit of slough on the surface but there is no undermining or tunneling. He does have some hypertrophic granulation tissue accumulation. 10/17/2022: His elbow wound continues to improve. It is smaller, cleaner, and more superficial. There is hypertrophic granulation tissue present. Patient History Information obtained from Patient. Family History Diabetes - Maternal Grandparents, Heart Disease - Mother, Hypertension - Mother, No family history of Cancer, Hereditary Spherocytosis, Kidney Disease, Lung Disease, Seizures, Stroke, Thyroid Problems, Tuberculosis. Social History Never smoker, Marital Status - Married, Alcohol Use - Never, Drug Use - No History, Caffeine Use - Never. Medical History Eyes Patient has history of Cataracts - removed Cardiovascular Patient has history of  Arrhythmia - Atrial Flutter/Atrial Fib, Coronary Artery Disease, Hypotension Denies history of Hypertension Musculoskeletal Patient has history of Osteoarthritis Neurologic Denies history of Paraplegia Hospitalization/Surgery History - open heart surgery. - IR generic histoircal. - appendectomy. - hernia repair. - joint replacement. - mitral valve repair. - mvp repair. - total hip arthroplasty. Medical A Surgical History Notes nd Constitutional Symptoms (General Health) obseity Ear/Nose/Mouth/Throat wears hearing aids Cardiovascular Mitral Valve Repair, Genitourinary BPH Neurologic CVA 2018, left side hemiparesis Objective Constitutional Hypertensive, asymptomatic. Slightly tachycardic. no acute distress. Vitals Time Taken: 3:19 PM, Height: 66 in, Weight: 185 lbs, BMI: 29.9, Temperature: 98.3 F, Pulse: 107 bpm, Respiratory Rate: 16 breaths/min, Blood Pressure: 154/70 mmHg. Respiratory Brian Brian [name], Brian Brian [name] ([id number]) [id number] Page 6 of 8 Normal work of breathing on room air. General Notes: 10/17/2022: His elbow wound continues to improve. It is smaller, cleaner, and more superficial. There is hypertrophic granulation tissue present. Integumentary (Hair, Skin) Wound #1 status is Open. Original cause of wound was Pressure Injury. The date acquired was: 12/19/2021. The wound has been in treatment 35 weeks. The wound is located on the Left Elbow. The wound measures 1.9cm length x 3.1cm width x 0.2cm depth; 4.626cm^2 area and 0.925cm^3 volume. There is Fat Layer (Subcutaneous Tissue) exposed. There is no tunneling or undermining noted. There is a medium amount of serosanguineous drainage noted. The wound margin is distinct with the outline attached to the wound base. There is large (67-100%) red, hyper - granulation within the wound bed. There is a small (1-33%) amount of necrotic tissue within the wound bed including Adherent Slough. The periwound skin  appearance had no abnormalities noted for texture. The periwound skin appearance had no abnormalities noted for moisture. The periwound skin appearance had no abnormalities noted for color. Periwound temperature was noted as No Abnormality. The periwound has tenderness on palpation. 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 An Chemical Cauterization procedure was performed by Brian Maudlin, MD. Post procedure Diagnosis Wound #1: Same as Pre-Procedure Plan Follow-up Appointments: Return appointment in 3 weeks. - Dr. Celine Ahr Room 3 Tuesday March 19th at [facility] Bathing/ Shower/ Hygiene: May shower and wash wound with soap and water. - with dressing changes Off-Loading: Turn and reposition every 2 hours - stay off of left elbow wear prevalon boot on elbow as directed Other: - limit time in wheelchair, reposition in wheelchair every 1-2 hours Home Health: No change in wound care orders this week; continue Home Health for wound care. May utilize formulary equivalent dressing for wound treatment orders unless otherwise specified. Dressing changes to be completed by [place] on Monday / Wednesday / Friday except when patient has scheduled visit at [facility]. Other Home Health Orders/Instructions: - [facility] WOUND #1: - Elbow Wound Laterality: Left Cleanser: Soap and Water Every Other Day/30 Days Discharge Instructions: May shower and wash wound with dial antibacterial soap and water prior to dressing change. Cleanser: Wound Cleanser (Generic) Every Other Day/30 Days Discharge Instructions: Cleanse the wound with wound cleanser prior to applying a clean dressing using  gauze sponges, not tissue or cotton balls. Prim Dressing: Hydrofera Blue Ready Transfer Foam, 4x5 (in/in) Every Other Day/30 Days ary Discharge Instructions: Apply to wound bed as instructed Secondary Dressing: ALLEVYN Gentle Border, 5x5 (  in/in) (Generic) Every Other Day/30 Days Discharge Instructions: Apply over primary dressing as directed. Add-Ons: Cotton Tip Applicator, 6 (in) (Generic) Every Other Day/30 Days 10/17/2022: His elbow wound continues to improve. It is smaller, cleaner, and more superficial. There is hypertrophic granulation tissue present. No debridement was necessary today. I did chemically cauterize the hypertrophic granulation tissue with silver nitrate. We will continue Hydrofera Blue with a heel cup. He will continue to use a Prevalon boot to offload his elbow. Follow-up in 3 weeks. Electronic Signature(s) Signed: 10/17/2022 4:01:11 PM By: Brian Maudlin MD FACS Entered By: Brian Brian [name] on 10/17/2022 16:01:11 Brian Brian [name], Brian Brian [name] ([id number]) [phone].pdf Page 7 of 8 -------------------------------------------------------------------------------- HxROS Details Patient Name: Date of Service: Brian Brian [name] ID Brian [name] 10/17/2022 3:00 PM Medical Record Number: [id number] Patient Account Number: [id number] Date of Birth/Sex: Treating RN: 1933/06/03 (89 y.o. Brian [name]) Primary Care Provider: Abelino Brian [name] Other Clinician: Referring Provider: Treating Provider/Extender: Brian Brian [name] in Treatment: 32 Information Obtained From Patient Constitutional Symptoms (General Health) Medical History: Past Medical History Notes: obseity Eyes Medical History: Positive for: Cataracts - removed Ear/Nose/Mouth/Throat Medical History: Past Medical History Notes: wears hearing aids Cardiovascular Medical History: Positive for: Arrhythmia - Atrial Flutter/Atrial Fib; Coronary Artery Disease; Hypotension Negative for: Hypertension Past  Medical History Notes: Mitral Valve Repair, Genitourinary Medical History: Past Medical History Notes: BPH Musculoskeletal Medical History: Positive for: Osteoarthritis Neurologic Medical History: Negative for: Paraplegia Past Medical History Notes: CVA 2018, left side hemiparesis HBO Extended History Items Eyes: Cataracts Immunizations Pneumococcal Vaccine: Received Pneumococcal Vaccination: Yes Received Pneumococcal Vaccination On or After 60th Birthday: Yes Implantable Devices None Hospitalization / Surgery History Type of Hospitalization/Surgery open heart surgery IR generic histoircal appendectomy hernia repair joint replacement mitral valve repair [name] ([id number]) [phone].pdf Page 8 of 8 mvp repair total hip arthroplasty Family and Social History Cancer: No; Diabetes: Yes - Maternal Grandparents; Heart Disease: Yes - Mother; Hereditary Spherocytosis: No; Hypertension: Yes - Mother; Kidney Disease: No; Lung Disease: No; Seizures: No; Stroke: No; Thyroid Problems: No; Tuberculosis: No; Never smoker; Marital Status - Married; Alcohol Use: Never; Drug Use: No History; Caffeine Use: Never; Financial Concerns: No; Food, Clothing or Shelter Needs: No; Support System Lacking: No; Transportation Concerns: No Electronic Signature(s) Signed: 10/17/2022 4:01:45 PM By: Brian Maudlin MD FACS Entered By: Brian Brian [name] on 10/17/2022 15:59:27 -------------------------------------------------------------------------------- SuperBill Details Patient Name: Date of Service: Brian Brian [name] ID Brian [name] 10/17/2022 Medical Record Number: [id number] Patient Account Number: [id number] Date of Birth/Sex: Treating RN: 17-Mar-1933 (89 y.o. Brian [name]) Primary Care Provider: Abelino Brian [name] Other Clinician: Referring Provider: Treating Provider/Extender: Brian Brian [name] in Treatment: 35 Diagnosis Coding ICD-10 Codes Code  Description [phone] Pressure ulcer of left elbow, stage 3 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 I48.19 Other persistent atrial fibrillation I25.10 Atherosclerotic heart disease of native coronary artery without angina pectoris Facility Procedures : CPT4 Code: [id number] Description: [id number] - CHEM CAUT GRANULATION TISS ICD-10 Diagnosis Description L89.023 Pressure ulcer of left elbow, stage 3 Modifier: Quantity: 1 Physician Procedures : CPT4 Code Description Modifier [id number] - WC PHYS LEVEL 3 - EST PT ICD-10 Diagnosis Description L89.023 Pressure ulcer of left elbow, stage 3 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 I25.10 Atherosclerotic  heart disease of native coronary artery without angina pectoris I48.19 Other persistent atrial fibrillation Quantity: 1 : [id number] - WC PHYS CHEM CAUT GRAN TISSUE ICD-10 Diagnosis Description L89.023 Pressure ulcer of left elbow, stage 3  Quantity: 1 Electronic Signature(s) Signed: 10/17/2022 4:01:31 PM By: Brian Maudlin MD FACS Entered By: Brian Brian [name] on 10/17/2022 16:01:31

## 2022-10-19 NOTE — Progress Notes (Signed)
RAKAN, LIMES ([id number]) [id number] Page 1 of 6 Visit Report for 10/17/2022 Arrival Information Details Patient Name: Date of Service: GO Brian Brian [name] ID Brian [name] 10/17/2022 3:00 PM Medical Record Number: [id number] Patient Account Number: [id number] Date of Birth/Sex: Treating RN: [birth year] (89 y.o. Brian Brian [name] Primary Care Brian Brian [name]: Brian Brian [name] Other Clinician: Referring Brian Brian [name]: Treating Brian Brian [name]/Extender: Brian Brian [name] in Treatment: 46 Visit Information History Since Last Visit All ordered tests and consults were completed: Yes Patient Arrived: Wheel Chair Added or deleted any medications: No Arrival Time: 15:07 Any new allergies or adverse reactions: No Accompanied By: son Had a fall or experienced change in No Transfer Assistance: Manual activities of daily living that may affect Patient Identification Verified: Yes risk of falls: Patient Requires Transmission-Based Precautions: No Signs or symptoms of abuse/neglect since last visito No Patient Has Alerts: Yes Hospitalized since last visit: No Patient Alerts: Patient on Blood Thinner Implantable device outside of the clinic excluding No cellular tissue based products placed in the center since last visit: Pain Present Now: Yes Electronic Signature(s) Signed: 10/17/2022 4:22:01 PM By: Brian Brian [name] Entered By: Brian Brian [name] on 10/17/2022 15:18:17 -------------------------------------------------------------------------------- Encounter Discharge Information Details Patient Name: Date of Service: GO Brian Brian [name], DA V ID Brian [name] 10/17/2022 3:00 PM Medical Record Number: [id number] Patient Account Number: [id number] Date of Birth/Sex: Treating RN: August 16, 1933 (89 y.o. Brian Brian [name] Primary Care Abdulkareem Badolato: Brian Brian [name] Other Clinician: Referring Brian Brian [name]: Treating Brian Brian [name]/Extender: Brian Brian [name] in Treatment: 58 Encounter Discharge Information  Items Discharge Condition: Stable Ambulatory Status: Wheelchair Discharge Destination: Home Transportation: Private Auto Accompanied By: son Schedule Follow-up Appointment: Yes Clinical Summary of Care: Patient Declined Notes Very difficult transfer to and from Wheelchair. 2-person assist. Electronic Signature(s) Signed: 10/18/2022 8:33:02 AM By: Brian Catholic RN Entered By: Brian Brian [name] on 10/17/2022 16:25:40 -------------------------------------------------------------------------------- Lower Extremity Assessment Details Patient Name: Date of Service: [name] Brian Brian [name] ID Brian [name] 10/17/2022 3:00 PM Medical Record Number: [id number] Patient Account Number: [id number] Date of Birth/Sex: Treating RN: 07-28-1933 (89 y.o. Brian Brian [name] Primary Care Brian Brian [name]: Brian Brian [name] Other Clinician: Referring Brian Brian [name]: Treating Brian Brian [name]/Extender: Brian Brian [name] in Treatment: [address] [facility] ([id number]) [phone].pdf Page 2 of 6 Electronic Signature(s) Signed: 10/17/2022 4:22:01 PM By: Brian Brian [name] Entered By: Brian Brian [name] on 10/17/2022 15:23:28 -------------------------------------------------------------------------------- Multi Wound Chart Details Patient Name: Date of Service: Brian Brian [name] ID Brian [name] 10/17/2022 3:00 PM Medical Record Number: [id number] Patient Account Number: [id number] Date of Birth/Sex: Treating RN: 12/23/1932 (89 y.o. Brian [name]) Primary Care Brian Brian [name]: Brian Brian [name] Other Clinician: Referring Brian Brian [name]: Treating Brian Brian [name]/Extender: Brian Brian [name] in Treatment: 35 Vital Signs Height(in): 66 Pulse(bpm): 107 Weight(lbs): 185 Blood Pressure(mmHg): 154/70 Body Mass Index(BMI): 29.9 Temperature(F): 98.3 Respiratory Rate(breaths/min): 16 [1:Photos:] [N/A:N/A] Left Elbow N/A N/A Wound Location: Pressure Injury N/A N/A Wounding Event: Pressure Ulcer N/A N/A Primary Etiology: Cataracts, Arrhythmia,  Coronary N/A N/A Comorbid History: Artery Disease, Hypotension, Osteoarthritis 12/19/2021 N/A N/A Date Acquired: 35 N/A N/A Weeks of Treatment: Open N/A N/A Wound Status: No N/A N/A Wound Recurrence: Yes N/A N/A Clustered Wound: 1.9x3.1x0.2 N/A N/A Measurements L x W x D (cm) 4.626 N/A N/A A (cm) : rea 0.925 N/A N/A Volume (cm) : 51.90% N/A N/A % Reduction in A rea: 3.80% N/A N/A % Reduction in Volume: Category/Stage III N/A N/A Classification: Medium N/A N/A Exudate A mount: Serosanguineous N/A N/A Exudate Type: red, brown N/A N/A Exudate Color: Distinct, outline attached N/A N/A Wound Margin: Large (67-100%)  N/A N/A Granulation A mount: Red, Hyper-granulation N/A N/A Granulation Quality: Small (1-33%) N/A N/A Necrotic A mount: Fat Layer (Subcutaneous Tissue): Yes N/A N/A Exposed Structures: Fascia: No Tendon: No Muscle: No Joint: No Bone: No Small (1-33%) N/A N/A Epithelialization: No Abnormalities Noted N/A N/A Periwound Skin Texture: No Abnormalities Noted N/A N/A Periwound Skin Moisture: No Abnormalities Noted N/A N/A Periwound Skin Color: No Abnormality N/A N/A Temperature: Yes N/A N/A Tenderness on Palpation: Chemical Cauterization N/A N/A Procedures Performed: Treatment Notes Electronic Signature(s) Signed: 10/17/2022 3:58:40 PM By: Brian Maudlin MD FACS [place], [place] ([id number]) [phone].pdf Page 3 of 6 Entered By: Brian Brian [name] on 10/17/2022 15:58:39 -------------------------------------------------------------------------------- Multi-Disciplinary Care Plan Details Patient Name: Date of Service: GO Brian Brian [name] ID Brian [name] 10/17/2022 3:00 PM Medical Record Number: [id number] Patient Account Number: [id number] Date of Birth/Sex: Treating RN: 1933/03/04 (89 y.o. Brian Brian [name] Primary Care Brian Brian [name]: Brian Brian [name] Other Clinician: Referring Brian Brian [name]: Treating Brian Brian [name]/Extender: Brian Brian [name] in Treatment: 54 Multidisciplinary Care Plan reviewed with physician Active Inactive Abuse / Safety / Falls / Self Care Management Nursing Diagnoses: Impaired physical mobility Potential for falls Goals: Patient/caregiver will verbalize understanding of skin care regimen Date Initiated: 02/14/2022 Target Resolution Date: 02/17/2023 Goal Status: Active Patient/caregiver will verbalize/demonstrate measures taken to prevent injury and/or falls Date Initiated: 02/14/2022 Target Resolution Date: 02/17/2023 Goal Status: Active Interventions: Assess fall risk on admission and as needed Assess self care needs on admission and as needed Notes: Wound/Skin Impairment Nursing Diagnoses: Impaired tissue integrity Knowledge deficit related to ulceration/compromised skin integrity Goals: Patient/caregiver will verbalize understanding of skin care regimen Date Initiated: 02/14/2022 Target Resolution Date: 02/17/2023 Goal Status: Active Ulcer/skin breakdown will have a volume reduction of 30% by week 4 Date Initiated: 02/14/2022 Date Inactivated: 05/23/2022 Target Resolution Date: 05/05/2022 Goal Status: Unmet Unmet Reason: new PU left glut Interventions: Assess patient/caregiver ability to obtain necessary supplies Assess patient/caregiver ability to perform ulcer/skin care regimen upon admission and as needed Assess ulceration(s) every visit Treatment Activities: Skin care regimen initiated : 02/14/2022 Topical wound management initiated : 02/14/2022 Notes: Electronic Signature(s) Signed: 10/18/2022 8:33:02 AM By: Brian Catholic RN Entered By: Brian Brian [name] on 10/17/2022 16:23:15 -------------------------------------------------------------------------------- Pain Assessment Details Patient Name: Date of Service: GO Brian Brian [name] ID Brian [name] 10/17/2022 3:00 PM Medical Record Number: [id number] Patient Account Number: [id number] Brian Brian [name], Brian Brian [name] ([id number])  [id number].pdf Page 4 of 6 Date of Birth/Sex: Treating RN: 10-13-1932 (89 y.o. Brian Brian [name] Primary Care Wally Behan: Other Clinician: Abelino [name] Referring Belinda Bringhurst: Treating Chesley Veasey/Extender: Brian Brian [name] in Treatment: 35 Active Problems Location of Pain Severity and Description of Pain Patient Has Paino Yes Site Locations Duration of the Pain. Constant / Intermittento Constant Rate the pain. Current Pain Level: 5 Worst Pain Level: 5 Least Pain Level: 5 Character of Pain Describe the Pain: Aching Pain Management and Medication Current Pain Management: Notes Pain in right shoulder per pt. Electronic Signature(s) Signed: 10/17/2022 4:22:01 PM By: Brian Brian [name] Entered By: Brian Brian [name] on 10/17/2022 15:21:14 -------------------------------------------------------------------------------- Patient/Caregiver Education Details Patient Name: Date of Service: GO Brian Brian [name] ID Brian [name] 2/27/2024andnbsp3:00 PM Medical Record Number: [id number] Patient Account Number: [id number] Date of Birth/Gender: Treating RN: 1933-07-07 (89 y.o. Brian Brian [name] Primary Care Physician: Brian Brian [name] Other Clinician: Referring Physician: Treating Physician/Extender: Brian Brian [name] in Treatment: 35 Education Assessment Education Provided To: Patient Education Topics Provided Wound/Skin Impairment: Methods: Explain/Verbal Responses: Return demonstration correctly Electronic Signature(s) Signed: 10/18/2022 8:33:02 AM By: Brian Catholic  RN Entered By: Brian Brian [name] on 10/17/2022 16:23:34 Brophy, Brian Brian [name] ([id number].pdf Page 5 of 6 -------------------------------------------------------------------------------- Wound Assessment Details Patient Name: Date of Service: GO Brian Brian [name] ID Brian [name] 10/17/2022 3:00 PM Medical Record Number: [id number] Patient Account Number: [id number] Date  of Birth/Sex: Treating RN: [birth year] (89 y.o. Brian Brian [name] Primary Care Micaiah Litle: Brian Brian [name] Other Clinician: Referring Wardell Pokorski: Treating Dixon Luczak/Extender: Brian Brian [name] in Treatment: 35 Wound Status Wound Number: 1 Primary Pressure Ulcer Etiology: Wound Location: Left Elbow Wound Status: Open Wounding Event: Pressure Injury Comorbid Cataracts, Arrhythmia, Coronary Artery Disease, Date Acquired: 12/19/2021 History: Hypotension, Osteoarthritis Weeks Of Treatment: 35 Clustered Wound: Yes Photos Wound Measurements Length: (cm) 1.9 Width: (cm) 3.1 Depth: (cm) 0.2 Area: (cm) 4.626 Volume: (cm) 0.925 % Reduction in Area: 51.9% % Reduction in Volume: 3.8% Epithelialization: Small (1-33%) Tunneling: No Undermining: No Wound Description Classification: Category/Stage III Wound Margin: Distinct, outline attached Exudate Amount: Medium Exudate Type: Serosanguineous Exudate Color: red, brown Foul Odor After Cleansing: No Slough/Fibrino Yes Wound Bed Granulation Amount: Large (67-100%) Exposed Structure Granulation Quality: Red, Hyper-granulation Fascia Exposed: No Necrotic Amount: Small (1-33%) Fat Layer (Subcutaneous Tissue) Exposed: Yes Necrotic Quality: Adherent Slough Tendon Exposed: No Muscle Exposed: No Joint Exposed: No Bone Exposed: No Periwound Skin Texture Texture Color No Abnormalities Noted: Yes No Abnormalities Noted: Yes Moisture Temperature / Pain No Abnormalities Noted: Yes Temperature: No Abnormality Tenderness on Palpation: Yes Treatment Notes Wound #1 (Elbow) Wound Laterality: Left Cleanser Soap and Water Discharge Instruction: May shower and wash wound with dial antibacterial soap and water prior to dressing change. Wound Cleanser Discharge Instruction: Cleanse the wound with wound cleanser prior to applying a clean dressing using gauze sponges, not tissue or cotton balls. Peri-Wound Care Aplin, Brian Brian [name]  ([id number]) [id number] Page 6 of 6 Topical Primary Dressing Hydrofera Blue Ready Transfer Foam, 4x5 (in/in) Discharge Instruction: Apply to wound bed as instructed Secondary Dressing ALLEVYN Gentle Border, 5x5 (in/in) Discharge Instruction: Apply over primary dressing as directed. Secured With Compression Wrap Compression Stockings Add-Ons Soil scientist, 6 (in) Electronic Signature(s) Signed: 10/17/2022 4:22:01 PM By: Brian Brian [name] Signed: 10/18/2022 8:33:02 AM By: Brian Catholic RN Entered By: Brian Brian [name] on 10/17/2022 15:31:07 -------------------------------------------------------------------------------- Vitals Details Patient Name: Date of Service: GO Brian Brian [name], DA V ID Brian [name] 10/17/2022 3:00 PM Medical Record Number: [id number] Patient Account Number: [id number] Date of Birth/Sex: Treating RN: May 05, 1933 (89 y.o. Brian Brian [name] Primary Care Jamiah Recore: Brian Brian [name] Other Clinician: Referring Henlee Donovan: Treating Denessa Cavan/Extender: Brian Brian [name] in Treatment: 35 Vital Signs Time Taken: 15:19 Temperature (F): 98.3 Height (in): 66 Pulse (bpm): 107 Weight (lbs): 185 Respiratory Rate (breaths/min): 16 Body Mass Index (BMI): 29.9 Blood Pressure (mmHg): 154/70 Reference Range: 80 - 120 mg / dl Electronic Signature(s) Signed: 10/17/2022 4:22:01 PM By: Brian Brian [name] Entered By: Brian Brian [name] on 10/17/2022 15:19:41

## 2022-10-23 DIAGNOSIS — I69354 Hemiplegia and hemiparesis following cerebral infarction affecting left non-dominant side: Secondary | ICD-10-CM | POA: Diagnosis not present

## 2022-10-23 DIAGNOSIS — I119 Hypertensive heart disease without heart failure: Secondary | ICD-10-CM | POA: Diagnosis not present

## 2022-10-23 DIAGNOSIS — I2510 Atherosclerotic heart disease of native coronary artery without angina pectoris: Secondary | ICD-10-CM | POA: Diagnosis not present

## 2022-10-23 DIAGNOSIS — I4819 Other persistent atrial fibrillation: Secondary | ICD-10-CM | POA: Diagnosis not present

## 2022-10-23 DIAGNOSIS — Z7901 Long term (current) use of anticoagulants: Secondary | ICD-10-CM | POA: Diagnosis not present

## 2022-10-23 DIAGNOSIS — L89023 Pressure ulcer of left elbow, stage 3: Secondary | ICD-10-CM | POA: Diagnosis not present

## 2022-10-24 DIAGNOSIS — I4819 Other persistent atrial fibrillation: Secondary | ICD-10-CM | POA: Diagnosis not present

## 2022-10-24 DIAGNOSIS — Z7901 Long term (current) use of anticoagulants: Secondary | ICD-10-CM | POA: Diagnosis not present

## 2022-10-24 DIAGNOSIS — I2510 Atherosclerotic heart disease of native coronary artery without angina pectoris: Secondary | ICD-10-CM | POA: Diagnosis not present

## 2022-10-24 DIAGNOSIS — I69354 Hemiplegia and hemiparesis following cerebral infarction affecting left non-dominant side: Secondary | ICD-10-CM | POA: Diagnosis not present

## 2022-10-24 DIAGNOSIS — M179 Osteoarthritis of knee, unspecified: Secondary | ICD-10-CM | POA: Diagnosis not present

## 2022-10-24 DIAGNOSIS — I119 Hypertensive heart disease without heart failure: Secondary | ICD-10-CM | POA: Diagnosis not present

## 2022-10-24 DIAGNOSIS — Z86718 Personal history of other venous thrombosis and embolism: Secondary | ICD-10-CM | POA: Diagnosis not present

## 2022-10-24 DIAGNOSIS — Z952 Presence of prosthetic heart valve: Secondary | ICD-10-CM | POA: Diagnosis not present

## 2022-10-24 DIAGNOSIS — L89023 Pressure ulcer of left elbow, stage 3: Secondary | ICD-10-CM | POA: Diagnosis not present

## 2022-10-26 ENCOUNTER — Other Ambulatory Visit: Payer: Self-pay | Admitting: Family Medicine

## 2022-10-26 DIAGNOSIS — I2510 Atherosclerotic heart disease of native coronary artery without angina pectoris: Secondary | ICD-10-CM | POA: Diagnosis not present

## 2022-10-26 DIAGNOSIS — I119 Hypertensive heart disease without heart failure: Secondary | ICD-10-CM | POA: Diagnosis not present

## 2022-10-26 DIAGNOSIS — I4819 Other persistent atrial fibrillation: Secondary | ICD-10-CM | POA: Diagnosis not present

## 2022-10-26 DIAGNOSIS — I69354 Hemiplegia and hemiparesis following cerebral infarction affecting left non-dominant side: Secondary | ICD-10-CM | POA: Diagnosis not present

## 2022-10-26 DIAGNOSIS — Z7901 Long term (current) use of anticoagulants: Secondary | ICD-10-CM | POA: Diagnosis not present

## 2022-10-26 DIAGNOSIS — I6300 Cerebral infarction due to thrombosis of unspecified precerebral artery: Secondary | ICD-10-CM

## 2022-10-26 DIAGNOSIS — L89023 Pressure ulcer of left elbow, stage 3: Secondary | ICD-10-CM | POA: Diagnosis not present

## 2022-10-27 DIAGNOSIS — Z7901 Long term (current) use of anticoagulants: Secondary | ICD-10-CM | POA: Diagnosis not present

## 2022-10-27 DIAGNOSIS — I69354 Hemiplegia and hemiparesis following cerebral infarction affecting left non-dominant side: Secondary | ICD-10-CM | POA: Diagnosis not present

## 2022-10-27 DIAGNOSIS — I2510 Atherosclerotic heart disease of native coronary artery without angina pectoris: Secondary | ICD-10-CM | POA: Diagnosis not present

## 2022-10-27 DIAGNOSIS — I4819 Other persistent atrial fibrillation: Secondary | ICD-10-CM | POA: Diagnosis not present

## 2022-10-27 DIAGNOSIS — I119 Hypertensive heart disease without heart failure: Secondary | ICD-10-CM | POA: Diagnosis not present

## 2022-10-27 DIAGNOSIS — L89023 Pressure ulcer of left elbow, stage 3: Secondary | ICD-10-CM | POA: Diagnosis not present

## 2022-10-31 DIAGNOSIS — I4819 Other persistent atrial fibrillation: Secondary | ICD-10-CM | POA: Diagnosis not present

## 2022-10-31 DIAGNOSIS — I2510 Atherosclerotic heart disease of native coronary artery without angina pectoris: Secondary | ICD-10-CM | POA: Diagnosis not present

## 2022-10-31 DIAGNOSIS — I69354 Hemiplegia and hemiparesis following cerebral infarction affecting left non-dominant side: Secondary | ICD-10-CM | POA: Diagnosis not present

## 2022-10-31 DIAGNOSIS — L89023 Pressure ulcer of left elbow, stage 3: Secondary | ICD-10-CM | POA: Diagnosis not present

## 2022-10-31 DIAGNOSIS — I119 Hypertensive heart disease without heart failure: Secondary | ICD-10-CM | POA: Diagnosis not present

## 2022-10-31 DIAGNOSIS — Z7901 Long term (current) use of anticoagulants: Secondary | ICD-10-CM | POA: Diagnosis not present

## 2022-11-02 ENCOUNTER — Other Ambulatory Visit: Payer: Self-pay | Admitting: Family Medicine

## 2022-11-02 DIAGNOSIS — G4709 Other insomnia: Secondary | ICD-10-CM

## 2022-11-02 DIAGNOSIS — Z7901 Long term (current) use of anticoagulants: Secondary | ICD-10-CM | POA: Diagnosis not present

## 2022-11-02 DIAGNOSIS — I4819 Other persistent atrial fibrillation: Secondary | ICD-10-CM | POA: Diagnosis not present

## 2022-11-02 DIAGNOSIS — I2510 Atherosclerotic heart disease of native coronary artery without angina pectoris: Secondary | ICD-10-CM | POA: Diagnosis not present

## 2022-11-02 DIAGNOSIS — L89023 Pressure ulcer of left elbow, stage 3: Secondary | ICD-10-CM | POA: Diagnosis not present

## 2022-11-02 DIAGNOSIS — I119 Hypertensive heart disease without heart failure: Secondary | ICD-10-CM | POA: Diagnosis not present

## 2022-11-02 DIAGNOSIS — I69354 Hemiplegia and hemiparesis following cerebral infarction affecting left non-dominant side: Secondary | ICD-10-CM | POA: Diagnosis not present

## 2022-11-03 ENCOUNTER — Telehealth: Payer: Self-pay | Admitting: Family Medicine

## 2022-11-03 DIAGNOSIS — I4819 Other persistent atrial fibrillation: Secondary | ICD-10-CM | POA: Diagnosis not present

## 2022-11-03 DIAGNOSIS — L89023 Pressure ulcer of left elbow, stage 3: Secondary | ICD-10-CM | POA: Diagnosis not present

## 2022-11-03 DIAGNOSIS — I119 Hypertensive heart disease without heart failure: Secondary | ICD-10-CM | POA: Diagnosis not present

## 2022-11-03 DIAGNOSIS — I2510 Atherosclerotic heart disease of native coronary artery without angina pectoris: Secondary | ICD-10-CM | POA: Diagnosis not present

## 2022-11-03 DIAGNOSIS — I69354 Hemiplegia and hemiparesis following cerebral infarction affecting left non-dominant side: Secondary | ICD-10-CM | POA: Diagnosis not present

## 2022-11-03 DIAGNOSIS — Z7901 Long term (current) use of anticoagulants: Secondary | ICD-10-CM | POA: Diagnosis not present

## 2022-11-03 NOTE — Telephone Encounter (Signed)
Home Health verbal orders [facility] Name: [place] number: [phone]  Requesting OT/PT/Skilled nursing/Social Work/Speech:PT verbal orders, secure vm  Reason:bed mobility, transfers  Frequency:2w/2 and 1w/1  Please forward to [facility] pool or providers CMA

## 2022-11-07 ENCOUNTER — Encounter (HOSPITAL_BASED_OUTPATIENT_CLINIC_OR_DEPARTMENT_OTHER): Payer: Medicare Other | Admitting: Internal Medicine

## 2022-11-07 DIAGNOSIS — I4819 Other persistent atrial fibrillation: Secondary | ICD-10-CM | POA: Diagnosis not present

## 2022-11-07 DIAGNOSIS — Z7901 Long term (current) use of anticoagulants: Secondary | ICD-10-CM | POA: Diagnosis not present

## 2022-11-07 DIAGNOSIS — I69354 Hemiplegia and hemiparesis following cerebral infarction affecting left non-dominant side: Secondary | ICD-10-CM | POA: Diagnosis not present

## 2022-11-07 DIAGNOSIS — I2510 Atherosclerotic heart disease of native coronary artery without angina pectoris: Secondary | ICD-10-CM | POA: Diagnosis not present

## 2022-11-07 DIAGNOSIS — I119 Hypertensive heart disease without heart failure: Secondary | ICD-10-CM | POA: Diagnosis not present

## 2022-11-07 DIAGNOSIS — L89023 Pressure ulcer of left elbow, stage 3: Secondary | ICD-10-CM | POA: Diagnosis not present

## 2022-11-07 NOTE — Telephone Encounter (Signed)
Verbal order given written order will be faxed

## 2022-11-08 DIAGNOSIS — Z7901 Long term (current) use of anticoagulants: Secondary | ICD-10-CM | POA: Diagnosis not present

## 2022-11-08 DIAGNOSIS — I2510 Atherosclerotic heart disease of native coronary artery without angina pectoris: Secondary | ICD-10-CM | POA: Diagnosis not present

## 2022-11-08 DIAGNOSIS — L89023 Pressure ulcer of left elbow, stage 3: Secondary | ICD-10-CM | POA: Diagnosis not present

## 2022-11-08 DIAGNOSIS — I4819 Other persistent atrial fibrillation: Secondary | ICD-10-CM | POA: Diagnosis not present

## 2022-11-08 DIAGNOSIS — I69354 Hemiplegia and hemiparesis following cerebral infarction affecting left non-dominant side: Secondary | ICD-10-CM | POA: Diagnosis not present

## 2022-11-08 DIAGNOSIS — I119 Hypertensive heart disease without heart failure: Secondary | ICD-10-CM | POA: Diagnosis not present

## 2022-11-09 DIAGNOSIS — I119 Hypertensive heart disease without heart failure: Secondary | ICD-10-CM | POA: Diagnosis not present

## 2022-11-09 DIAGNOSIS — I2510 Atherosclerotic heart disease of native coronary artery without angina pectoris: Secondary | ICD-10-CM | POA: Diagnosis not present

## 2022-11-09 DIAGNOSIS — Z7901 Long term (current) use of anticoagulants: Secondary | ICD-10-CM | POA: Diagnosis not present

## 2022-11-09 DIAGNOSIS — I4819 Other persistent atrial fibrillation: Secondary | ICD-10-CM | POA: Diagnosis not present

## 2022-11-09 DIAGNOSIS — I69354 Hemiplegia and hemiparesis following cerebral infarction affecting left non-dominant side: Secondary | ICD-10-CM | POA: Diagnosis not present

## 2022-11-09 DIAGNOSIS — L89023 Pressure ulcer of left elbow, stage 3: Secondary | ICD-10-CM | POA: Diagnosis not present

## 2022-11-10 DIAGNOSIS — I4819 Other persistent atrial fibrillation: Secondary | ICD-10-CM | POA: Diagnosis not present

## 2022-11-10 DIAGNOSIS — I119 Hypertensive heart disease without heart failure: Secondary | ICD-10-CM | POA: Diagnosis not present

## 2022-11-10 DIAGNOSIS — I69354 Hemiplegia and hemiparesis following cerebral infarction affecting left non-dominant side: Secondary | ICD-10-CM | POA: Diagnosis not present

## 2022-11-10 DIAGNOSIS — L89023 Pressure ulcer of left elbow, stage 3: Secondary | ICD-10-CM | POA: Diagnosis not present

## 2022-11-10 DIAGNOSIS — Z7901 Long term (current) use of anticoagulants: Secondary | ICD-10-CM | POA: Diagnosis not present

## 2022-11-10 DIAGNOSIS — I2510 Atherosclerotic heart disease of native coronary artery without angina pectoris: Secondary | ICD-10-CM | POA: Diagnosis not present

## 2022-11-13 DIAGNOSIS — L89023 Pressure ulcer of left elbow, stage 3: Secondary | ICD-10-CM | POA: Diagnosis not present

## 2022-11-13 DIAGNOSIS — I2510 Atherosclerotic heart disease of native coronary artery without angina pectoris: Secondary | ICD-10-CM | POA: Diagnosis not present

## 2022-11-13 DIAGNOSIS — I119 Hypertensive heart disease without heart failure: Secondary | ICD-10-CM | POA: Diagnosis not present

## 2022-11-13 DIAGNOSIS — Z7901 Long term (current) use of anticoagulants: Secondary | ICD-10-CM | POA: Diagnosis not present

## 2022-11-13 DIAGNOSIS — I69354 Hemiplegia and hemiparesis following cerebral infarction affecting left non-dominant side: Secondary | ICD-10-CM | POA: Diagnosis not present

## 2022-11-13 DIAGNOSIS — I4819 Other persistent atrial fibrillation: Secondary | ICD-10-CM | POA: Diagnosis not present

## 2022-11-14 ENCOUNTER — Encounter (HOSPITAL_BASED_OUTPATIENT_CLINIC_OR_DEPARTMENT_OTHER): Payer: Medicare Other | Attending: General Surgery | Admitting: General Surgery

## 2022-11-14 DIAGNOSIS — I4819 Other persistent atrial fibrillation: Secondary | ICD-10-CM | POA: Diagnosis not present

## 2022-11-14 DIAGNOSIS — Z8673 Personal history of transient ischemic attack (TIA), and cerebral infarction without residual deficits: Secondary | ICD-10-CM | POA: Diagnosis not present

## 2022-11-14 DIAGNOSIS — L89023 Pressure ulcer of left elbow, stage 3: Secondary | ICD-10-CM | POA: Diagnosis not present

## 2022-11-14 DIAGNOSIS — I2510 Atherosclerotic heart disease of native coronary artery without angina pectoris: Secondary | ICD-10-CM | POA: Diagnosis not present

## 2022-11-14 DIAGNOSIS — Z86718 Personal history of other venous thrombosis and embolism: Secondary | ICD-10-CM | POA: Insufficient documentation

## 2022-11-14 NOTE — Progress Notes (Signed)
CHAYAN, DUSKEY ([id number]) [id number] Page 1 of 6 Visit Report for 11/14/2022 Arrival Information Details Patient Name: Date of Service: Brian Brian [name] ID M. 11/14/2022 2:00 PM Medical Record Number: [id number] Patient Account Number: [id number] Date of Birth/Sex: Treating RN: 06-Jun-1933 (89 y.o. Brian [name] Primary Care Trenda Corliss: Abelino Derrick Other Clinician: Referring Leviathan Macera: Treating Layali Freund/Extender: Sandi Raveling in Treatment: 39 Visit Information History Since Last Visit Added or deleted any medications: No Patient Arrived: Wheel Chair Any new allergies or adverse reactions: No Arrival Time: 14:47 Had a fall or experienced change in No Accompanied By: son activities of daily living that may affect Transfer Assistance: None risk of falls: Patient Identification Verified: Yes Signs or symptoms of abuse/neglect since last visito No Secondary Verification Process Completed: Yes Hospitalized since last visit: No Patient Requires Transmission-Based Precautions: No Implantable device outside of the clinic excluding No Patient Has Alerts: Yes cellular tissue based products placed in the center Patient Alerts: Patient on Blood Thinner since last visit: Has Dressing in Place as Prescribed: Yes Pain Present Now: Yes Electronic Signature(s) Signed: 11/14/2022 4:17:19 PM By: Adline Peals Entered By: Adline Peals on 11/14/2022 14:48:12 -------------------------------------------------------------------------------- Encounter Discharge Information Details Patient Name: Date of Service: Brian [name], Brian [name] ID M. 11/14/2022 2:00 PM Medical Record Number: [id number] Patient Account Number: [id number] Date of Birth/Sex: Treating RN: 07/17/1933 (89 y.o. Brian [name] Primary Care Marelly Wehrman: Abelino Derrick Other Clinician: Referring Aleisa Howk: Treating [name]/Extender: Sandi Raveling  in Treatment: 39 Encounter Discharge Information Items Post Procedure Vitals Discharge Condition: Stable Temperature (F): 98 Ambulatory Status: Wheelchair Pulse (bpm): 78 Discharge Destination: Home Respiratory Rate (breaths/min): 18 Transportation: Private Auto Blood Pressure (mmHg): 117/75 Accompanied By: son Schedule Follow-up Appointment: Yes Clinical Summary of Care: Patient Declined Electronic Signature(s) Signed: 11/14/2022 4:17:19 PM By: Adline Peals Entered By: Adline Peals on 11/14/2022 16:09:37 -------------------------------------------------------------------------------- Lower Extremity Assessment Details Patient Name: Date of Service: Brian Brian [name] ID M. 11/14/2022 2:00 PM Medical Record Number: [id number] Patient Account Number: [id number] Date of Birth/Sex: Treating RN: 1933-03-23 (89 y.o. Brian [name] Primary Care Sammy Cassar: Abelino Derrick Other Clinician: Referring Revonda Menter: Treating Carol Theys/Extender: Sandi Raveling in Treatment: 39 Electronic Signature(s) Signed: 11/14/2022 4:17:19 PM By: Adrian Prince, Jenaro M ([id number]) [id number] Page 2 of 6 Entered By: Adline Peals on 11/14/2022 14:48:56 -------------------------------------------------------------------------------- Multi Wound Chart Details Patient Name: Date of Service: Brian Brian [name] ID M. 11/14/2022 2:00 PM Medical Record Number: [id number] Patient Account Number: [id number] Date of Birth/Sex: Treating RN: Jul 07, 1933 (89 y.o. M) Primary Care Ayden Hardwick: Abelino Derrick Other Clinician: Referring Rainie Crenshaw: Treating Ameya Vowell/Extender: Sandi Raveling in Treatment: 39 Vital Signs Height(in): 66 Pulse(bpm): 78 Weight(lbs): 185 Blood Pressure(mmHg): 117/75 Body Mass Index(BMI): 29.9 Temperature(F): 98 Respiratory Rate(breaths/min): 18 [1:Photos:] [N/A:N/A] Left Elbow N/A N/A Wound  Location: Pressure Injury N/A N/A Wounding Event: Pressure Ulcer N/A N/A Primary Etiology: Cataracts, Arrhythmia, Coronary N/A N/A Comorbid History: Artery Disease, Hypotension, Osteoarthritis 12/19/2021 N/A N/A Date Acquired: 49 N/A N/A Weeks of Treatment: Open N/A N/A Wound Status: No N/A N/A Wound Recurrence: Yes N/A N/A Clustered Wound: 2x2.4x0.1 N/A N/A Measurements L x W x D (cm) 3.77 N/A N/A A (cm) : rea 0.377 N/A N/A Volume (cm) : 60.80% N/A N/A % Reduction in A rea: 60.80% N/A N/A % Reduction in Volume: 12 Starting Position 1 (o'clock): 3 Ending Position 1 (o'clock): 0.4 Maximum Distance 1 (cm): Yes N/A N/A Undermining: Category/Stage III  N/A N/A Classification: Medium N/A N/A Exudate A mount: Serosanguineous N/A N/A Exudate Type: red, brown N/A N/A Exudate Color: Distinct, outline attached N/A N/A Wound Margin: Medium (34-66%) N/A N/A Granulation A mount: Red, Hyper-granulation N/A N/A Granulation Quality: Medium (34-66%) N/A N/A Necrotic A mount: Fat Layer (Subcutaneous Tissue): Yes N/A N/A Exposed Structures: Fascia: No Tendon: No Muscle: No Joint: No Bone: No Medium (34-66%) N/A N/A Epithelialization: Debridement - Excisional N/A N/A Debridement: Pre-procedure Verification/Time Out 14:55 N/A N/A Taken: Lidocaine 4% Topical Solution N/A N/A Pain Control: Subcutaneous, Slough N/A N/A Tissue Debrided: Skin/Subcutaneous Tissue N/A N/A Level: 4.8 N/A N/A Debridement A (sq cm): rea Curette N/A N/A Instrument: Minimum N/A N/A Bleeding: Pressure N/A N/A Hemostasis A chieved: Procedure was tolerated well N/A N/A Debridement Treatment Response: 2x2.4x0.1 N/A N/A Post Debridement Measurements L x W x D (cm) 0.377 N/A N/A Post Debridement Volume: (cm) Brian [name] ([id number]) [id number] Page 3 of 6 Category/Stage III N/A N/A Post Debridement Stage: No Abnormalities Noted N/A N/A Periwound Skin  Texture: No Abnormalities Noted N/A N/A Periwound Skin Moisture: No Abnormalities Noted N/A N/A Periwound Skin Color: No Abnormality N/A N/A Temperature: Yes N/A N/A Tenderness on Palpation: Debridement N/A N/A Procedures Performed: Treatment Notes Electronic Signature(s) Signed: 11/14/2022 3:45:33 PM By: Fredirick Maudlin MD FACS Entered By: Fredirick Maudlin on 11/14/2022 15:45:33 -------------------------------------------------------------------------------- Multi-Disciplinary Care Plan Details Patient Name: Date of Service: Brian [name], Brian [name] ID M. 11/14/2022 2:00 PM Medical Record Number: [id number] Patient Account Number: [id number] Date of Birth/Sex: Treating RN: 1933-03-17 (89 y.o. Brian [name] Primary Care Cailen Mihalik: Abelino Derrick Other Clinician: Referring Jadia Capers: Treating Kahron Kauth/Extender: Sandi Raveling in Treatment: 39 Multidisciplinary Care Plan reviewed with physician Active Inactive Abuse / Safety / Falls / Self Care Management Nursing Diagnoses: Impaired physical mobility Potential for falls Goals: Patient/caregiver will verbalize understanding of skin care regimen Date Initiated: 02/14/2022 Target Resolution Date: 02/17/2023 Goal Status: Active Patient/caregiver will verbalize/demonstrate measures taken to prevent injury and/or falls Date Initiated: 02/14/2022 Target Resolution Date: 02/17/2023 Goal Status: Active Interventions: Assess fall risk on admission and as needed Assess self care needs on admission and as needed Notes: Wound/Skin Impairment Nursing Diagnoses: Impaired tissue integrity Knowledge deficit related to ulceration/compromised skin integrity Goals: Patient/caregiver will verbalize understanding of skin care regimen Date Initiated: 02/14/2022 Target Resolution Date: 02/17/2023 Goal Status: Active Ulcer/skin breakdown will have a volume reduction of 30% by week 4 Date Initiated: 02/14/2022 Date  Inactivated: 05/23/2022 Target Resolution Date: 05/05/2022 Goal Status: Unmet Unmet Reason: new PU left glut Interventions: Assess patient/caregiver ability to obtain necessary supplies Assess patient/caregiver ability to perform ulcer/skin care regimen upon admission and as needed Assess ulceration(s) every visit Treatment Activities: Skin care regimen initiated : 02/14/2022 Topical wound management initiated : 02/14/2022 Notes: Brian [name], Brian [name] ([id number]) [phone].pdf Page 4 of 6 Electronic Signature(s) Signed: 11/14/2022 4:17:19 PM By: Adline Peals Entered By: Adline Peals on 11/14/2022 16:08:30 -------------------------------------------------------------------------------- Pain Assessment Details Patient Name: Date of Service: Brian Brian [name] ID M. 11/14/2022 2:00 PM Medical Record Number: [id number] Patient Account Number: [id number] Date of Birth/Sex: Treating RN: August 13, 1933 (89 y.o. Brian [name] Primary Care Aneth Schlagel: Abelino Derrick Other Clinician: Referring Jerone Cudmore: Treating Solomon Skowronek/Extender: Sandi Raveling in Treatment: 39 Active Problems Location of Pain Severity and Description of Pain Patient Has Paino Yes Site Locations Pain Location: Pain in Ulcers Duration of the Pain. Constant / Intermittento Constant Rate the pain. Current Pain Level: 5 Pain Management and Medication Current Pain  Management: Electronic Signature(s) Signed: 11/14/2022 4:17:19 PM By: Sabas Sous By: Adline Peals on 11/14/2022 14:48:53 -------------------------------------------------------------------------------- Patient/Caregiver Education Details Patient Name: Date of Service: Brian Brian [name] ID M. 3/26/2024andnbsp2:00 PM Medical Record Number: [id number] Patient Account Number: [id number] Date of Birth/Gender: Treating RN: 01-15-1933 (89 y.o. Brian [name] Primary Care Physician: Abelino Derrick Other Clinician: Referring Physician: Treating Physician/Extender: Sandi Raveling in Treatment: 39 Education Assessment Education Provided To: Patient Education Topics Provided Wound/Skin Impairment: Methods: Explain/Verbal Responses: Reinforcements needed, State content correctly Bushnell, Youlanda Roys ([id number]) [id number] Page 5 of 6 Electronic Signature(s) Signed: 11/14/2022 4:17:19 PM By: Adline Peals Entered By: Adline Peals on 11/14/2022 16:08:53 -------------------------------------------------------------------------------- Wound Assessment Details Patient Name: Date of Service: Brian Brian [name] ID M. 11/14/2022 2:00 PM Medical Record Number: [id number] Patient Account Number: [id number] Date of Birth/Sex: Treating RN: Nov 30, 1932 (89 y.o. Brian [name] Primary Care Arizona Nordquist: Abelino Derrick Other Clinician: Referring Dakisha Schoof: Treating Estle Sabella/Extender: Sandi Raveling in Treatment: 39 Wound Status Wound Number: 1 Primary Pressure Ulcer Etiology: Wound Location: Left Elbow Wound Status: Open Wounding Event: Pressure Injury Comorbid Cataracts, Arrhythmia, Coronary Artery Disease, Date Acquired: 12/19/2021 History: Hypotension, Osteoarthritis Weeks Of Treatment: 39 Clustered Wound: Yes Photos Wound Measurements Length: (cm) 2 Width: (cm) 2.4 Depth: (cm) 0.1 Area: (cm) 3.77 Volume: (cm) 0.377 % Reduction in Area: 60.8% % Reduction in Volume: 60.8% Epithelialization: Medium (34-66%) Tunneling: No Undermining: Yes Starting Position (o'clock): 12 Ending Position (o'clock): 3 Maximum Distance: (cm) 0.4 Wound Description Classification: Category/Stage III Wound Margin: Distinct, outline attached Exudate Amount: Medium Exudate Type: Serosanguineous Exudate Color: red, brown Foul Odor After Cleansing: No Slough/Fibrino Yes Wound Bed Granulation Amount: Medium  (34-66%) Exposed Structure Granulation Quality: Red, Hyper-granulation Fascia Exposed: No Necrotic Amount: Medium (34-66%) Fat Layer (Subcutaneous Tissue) Exposed: Yes Necrotic Quality: Adherent Slough Tendon Exposed: No Muscle Exposed: No Joint Exposed: No Bone Exposed: No Periwound Skin Texture Texture Color No Abnormalities Noted: Yes No Abnormalities Noted: Yes Moisture Temperature / Pain No Abnormalities Noted: Yes Temperature: No Abnormality Tenderness on Palpation: Yes Brian [name], Brian [name] ([id number]) [id number] Page 6 of 6 Treatment Notes Wound #1 (Elbow) Wound Laterality: Left Cleanser Soap and Water Discharge Instruction: May shower and wash wound with dial antibacterial soap and water prior to dressing change. Wound Cleanser Discharge Instruction: Cleanse the wound with wound cleanser prior to applying a clean dressing using gauze sponges, not tissue or cotton balls. Peri-Wound Care Topical Primary Dressing Hydrofera Blue Ready Transfer Foam, 4x5 (in/in) Discharge Instruction: Apply to wound bed as instructed Secondary Dressing ALLEVYN Gentle Border, 5x5 (in/in) Discharge Instruction: Apply over primary dressing as directed. Secured With Compression Wrap Compression Stockings Add-Ons Soil scientist, 6 (in) Electronic Signature(s) Signed: 11/14/2022 3:23:50 PM By: Worthy Rancher Signed: 11/14/2022 4:17:19 PM By: Adline Peals Entered By: Worthy Rancher on 11/14/2022 14:51:32 -------------------------------------------------------------------------------- Vitals Details Patient Name: Date of Service: Brian Brian [name], Brian [name] ID M. 11/14/2022 2:00 PM Medical Record Number: [id number] Patient Account Number: [id number] Date of Birth/Sex: Treating RN: [birth year] (89 y.o. Brian [name] Primary Care Vang Kraeger: Abelino Derrick Other Clinician: Referring Lionell Matuszak: Treating Breunna Nordmann/Extender: Sandi Raveling in  Treatment: 39 Vital Signs Time Taken: 16:48 Temperature (F): 98 Height (in): 66 Pulse (bpm): 78 Weight (lbs): 185 Respiratory Rate (breaths/min): 18 Body Mass Index (BMI): 29.9 Blood Pressure (mmHg): 117/75 Reference Range: 80 - 120 mg / dl Electronic Signature(s) Signed: 11/14/2022 4:17:19 PM By: Adline Peals Entered By: Adline Peals on 11/14/2022  14:48:43 

## 2022-11-14 NOTE — Progress Notes (Signed)
Brian, [name] ([id number]) [id number] Page 1 of 9 Visit Report for 11/14/2022 Chief Complaint Document Details Patient Name: Date of Service: Brian Brian [name] 11/14/2022 2:00 PM Medical Record Number: [id number] Patient Account Number: [id number] Date of Birth/Sex: Treating RN: Sep 10, 1932 (89 y.o. [name]) Primary Care Provider: Abelino Derrick Other Clinician: Referring Provider: Treating Provider/Extender: Sandi Raveling in Treatment: 39 Information Obtained from: Patient Chief Complaint 08/05/2019; patient is here for review of pressure ulcers on his buttock 02/14/22: patient is here for review of pressure ulcers on his left elbow Electronic Signature(s) Signed: 11/14/2022 3:45:41 PM By: Fredirick Maudlin MD FACS Entered By: Fredirick Maudlin on 11/14/2022 15:45:41 -------------------------------------------------------------------------------- Debridement Details Patient Name: Date of Service: Brian Eddie Dibbles, Brian [name] 11/14/2022 2:00 PM Medical Record Number: [id number] Patient Account Number: [id number] Date of Birth/Sex: Treating RN: 02/25/1933 (89 y.o. [name] Primary Care Provider: Abelino Derrick Other Clinician: Referring Provider: Treating Provider/Extender: Sandi Raveling in Treatment: 39 Debridement Performed for Assessment: Wound #1 Left Elbow Performed By: Physician Fredirick Maudlin, MD Debridement Type: Debridement Level of Consciousness (Pre-procedure): Awake and Alert Pre-procedure Verification/Time Out Yes - 14:55 Taken: Start Time: 14:55 Pain Control: Lidocaine 4% T opical Solution T Area Debrided (L x W): otal 2 (cm) x 2.4 (cm) = 4.8 (cm) Tissue and other material debrided: Slough, Subcutaneous, Slough Level: Skin/Subcutaneous Tissue Debridement Description: Excisional Instrument: Curette Bleeding: Minimum Hemostasis Achieved: Pressure Response to Treatment: Procedure was  tolerated well Level of Consciousness (Post- Awake and Alert procedure): Post Debridement Measurements of Total Wound Length: (cm) 2 Stage: Category/Stage III Width: (cm) 2.4 Depth: (cm) 0.1 Volume: (cm) 0.377 Character of Wound/Ulcer Post Debridement: Improved Post Procedure Diagnosis Same as Pre-procedure Notes scribed for Dr. Celine Ahr by Adline Peals, RN Electronic Signature(s) Signed: 11/14/2022 4:17:19 PM By: Adline Peals Signed: 11/14/2022 4:30:18 PM By: Fredirick Maudlin MD FACS Entered By: Adline Peals on 11/14/2022 14:57:16 Brian [name], Brian [name] ([id number]) [id number] Page 2 of 9 -------------------------------------------------------------------------------- HPI Details Patient Name: Date of Service: Brian Brian [name] 11/14/2022 2:00 PM Medical Record Number: [id number] Patient Account Number: [id number] Date of Birth/Sex: Treating RN: [birth year] (89 y.o. [name]) Primary Care Provider: Abelino Derrick Other Clinician: Referring Provider: Treating Provider/Extender: Sandi Raveling in Treatment: 39 History of Present Illness HPI Description: ADMISSION 08/05/19 This is an 89 year old man who arrives in clinic accompanied by his son. 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 Apparently noted recently to have a stage I pressure area on the left buttock. He does not have a wound history. He does have been air fluidized mattress. They have a wheelchair cushion as well as a pillow over the top of this. He is not incontinent of urine or bowel. Past medical history includes hypertension, osteoarthritis of the knee, atrial fibrillation, history of mitral valve repair, right hemisphere CVA with left hemiparesis, history of a DVT READMISSION 02/14/2022 The patient is here today for evaluation of pressure ulcers on his left elbow and possible right buttock ulcer. Apparently when he was  seen by Dr. Dellia Nims in 2020, no ulcer was appreciated on the buttock and no further wound care involvement occurred. 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 He does have home health aides and they are turning him every 2 hours side to  side. He is on a memory foam mattress at this time. 03/14/2022: The patient was scheduled to see me on July 11, but apparently his son panicked about some bright red blood per rectum and rushed him to the emergency room. There is also a comment in the electronic medical record that the patient's son thought the bone was exposed on his elbow. After waiting a prolonged period of time at the emergency department, they left without being seen. Today, it appears that the dressing on the patient's elbow was never changed since our last visit. The 2 wounds have converged creating a larger wound. According to the intake nurse, there was a foul odor from the wound and dressing. There is extensive slough on the wound surface. The bleeding per rectum turns out to be hemorrhoids. The patient's son expresses that he does not know how to manage this. 04/07/2022: For unclear reasons, the patient has not returned to clinic until they were contacted today and they made an add-on visit. The patient's son was not originally present at the beginning of the visit but showed up about 25 minutes into the visit. The patient was accompanied by a home aide. She reported that when she cleaned the patient up today, there was just a Band-Aid on his wound. She said the surface was fairly mucky but she was able to wash this off. It is abundantly clear that this wound is not being properly cared for. Apparently he did have [facility] home health but they have not been out in some time and it is not clear the reason for this. The surface of the wound is a bit cleaner so perhaps the  Santyl has been applied, but unfortunately there is now a deep tunnel that extends up from the elbow to the patient's posterior upper arm for a number centimeters. There is no evidence of infection or purulent drainage. It is also clear that the patient continues to spend a substantial portion of his time leaning on the elbow. 05/23/2022: Once again, the patient has failed to return to clinic for about 6 weeks and the reason remains unclear. He is apparently receiving home health assistance. The wound on his elbow is covered with hypertrophic granulation tissue. He also has a blister just distal to the wound on his posterior forearm. He has a new stage III pressure ulcer on his left gluteus with slough accumulation. He apparently spends a substantial portion of his days sitting in a wheelchair. I do not believe he has a Financial trader. Due to his stroke, he lists to the left side, which is how his ulcer on his elbow developed. I suspect the gluteal ulcer is as a result of this, as well. 06/13/2022: The wound on his left gluteus is nearly healed. It is very superficial and quite clean. His son reports that he has just been applying Desitin to the area. The elbow wound is smaller but still has some hypertrophic granulation tissue and slough accumulation. No concern for infection at either site. 06/27/2022: The buttock wounds are healed. There is some tissue maceration without any significant skin breakdown. The elbow wound continues to contract. There is still some hypertrophic granulation tissue and slough buildup. 07/25/2022: The buttock wounds have reopened. The periwound skin has been kept very dry through liberal use of zinc oxide, however. The elbow wound dressing did not look like it had been changed in at least a week when it was removed. The underlying wound, however has not reaccumulated any hypertrophic granulation  tissue; there is a thin layer of slough present. No malodor or purulent drainage  from the wound itself. 08/15/2022: There is a tiny opening remaining on the buttocks. It is clean and there is no evidence of tissue maceration. The elbow looks very good. There is good beefy tissue present and it is flush with the surrounding skin. Home health has been coming out. 09/05/2022: The wound on his bottom is healed. The elbow shows signs of pressure induced deep tissue injury and also has some hypertrophic granulation tissue. 09/26/2022: The elbow looks much better today. There is a little bit of slough on the surface but there is no undermining or tunneling. He does have some hypertrophic granulation tissue accumulation. 10/17/2022: His elbow wound continues to improve. It is smaller, cleaner, and more superficial. There is hypertrophic granulation tissue present. 11/14/2022: His elbow ulcer is smaller again today. There is some slough on the surface. Electronic Signature(s) Signed: 11/14/2022 3:46:03 PM By: Fredirick Maudlin MD FACS Entered By: Fredirick Maudlin on 11/14/2022 15:46:03 Physical Exam Details -------------------------------------------------------------------------------- Brian [name] ([id number]) [id number] Page 3 of 9 Patient Name: Date of Service: Brian Brian [name] 11/14/2022 2:00 PM Medical Record Number: [id number] Patient Account Number: [id number] Date of Birth/Sex: Treating RN: [birth year] (89 y.o. [name]) Primary Care Provider: Abelino Derrick Other Clinician: Referring Provider: Treating Provider/Extender: Sandi Raveling in Treatment: 10 Constitutional . . . . no acute distress. Respiratory Normal work of breathing on room air. Notes 11/14/2022: His elbow ulcer is smaller again today. There is some slough on the surface. Electronic Signature(s) Signed: 11/14/2022 3:46:27 PM By: Fredirick Maudlin MD FACS Entered By: Fredirick Maudlin on 11/14/2022  15:46:27 -------------------------------------------------------------------------------- Physician Orders Details Patient Name: Date of Service: Brian Brian [name] 11/14/2022 2:00 PM Medical Record Number: [id number] Patient Account Number: [id number] Date of Birth/Sex: Treating RN: 1933-04-02 (89 y.o. [name] Primary Care Provider: Abelino Derrick Other Clinician: Referring Provider: Treating Provider/Extender: Sandi Raveling in Treatment: 45 Verbal / Phone Orders: No Diagnosis Coding ICD-10 Coding Code Description L89.023 Pressure ulcer of left elbow, stage 3 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 I48.19 Other persistent atrial fibrillation I25.10 Atherosclerotic heart disease of native coronary artery without angina pectoris Follow-up Appointments Return appointment in 1 month. - Dr. Celine Ahr - room 2 12/09/2022 at 2:00 PM Anesthetic (In clinic) Topical Lidocaine 4% applied to wound bed Bathing/ Shower/ Hygiene May shower and wash wound with soap and water. - with dressing changes Off-Loading Turn and reposition every 2 hours - stay off of left elbow wear prevalon boot on elbow as directed Other: - limit time in wheelchair, reposition in wheelchair every 1-2 hours Home Health No change in wound care orders this week; continue Home Health for wound care. May utilize formulary equivalent dressing for wound treatment orders unless otherwise specified. Dressing changes to be completed by [facility] on Monday / Wednesday / Friday except when patient has scheduled visit at [facility]. Other Home Health Orders/Instructions: - [facility] Wound Treatment Wound #1 - Elbow Wound Laterality: Left Cleanser: Soap and Water Every Other Day/30 Days Discharge Instructions: May shower and wash wound with dial antibacterial soap and water prior to dressing change. Cleanser: Wound Cleanser (Generic) Every Other  Day/30 Days Discharge Instructions: Cleanse the wound with wound cleanser prior to applying a clean dressing using gauze sponges, not tissue or cotton balls. Prim Dressing: Hydrofera Blue Ready Transfer Foam, 4x5 (in/in) Every  Other Day/30 Days ary Discharge Instructions: Apply to wound bed as instructed Brian [name], Brian [name] ([id number]) [id number] Page 4 of 9 Secondary Dressing: ALLEVYN Gentle Border, 5x5 (in/in) (Generic) Every Other Day/30 Days Discharge Instructions: Apply over primary dressing as directed. Add-Ons: Cotton Tip Applicator, 6 (in) (Generic) Every Other Day/30 Days Patient Medications llergies: penicillin, Novocain A Notifications Medication Indication Start End 11/14/2022 lidocaine DOSE topical 4 % cream - cream topical Electronic Signature(s) Signed: 11/14/2022 4:30:18 PM By: Fredirick Maudlin MD FACS Entered By: Fredirick Maudlin on 11/14/2022 15:46:42 -------------------------------------------------------------------------------- Problem List Details Patient Name: Date of Service: Brian Brian [name] 11/14/2022 2:00 PM Medical Record Number: [id number] Patient Account Number: [id number] Date of Birth/Sex: Treating RN: 02/26/1933 (89 y.o. [name]) Primary Care Provider: Abelino Derrick Other Clinician: Referring Provider: Treating Provider/Extender: Sandi Raveling in Treatment: 39 Active Problems ICD-10 Encounter Code Description Active Date MDM Diagnosis L89.023 Pressure ulcer of left elbow, stage 3 02/14/2022 No Yes 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 02/14/2022 No Yes I48.19 Other persistent atrial fibrillation 02/14/2022 No Yes I25.10 Atherosclerotic heart disease of native coronary artery without angina pectoris 02/14/2022 No Yes Inactive Problems ICD-10 Code Description Active Date Inactive Date L89.323 Pressure ulcer of left buttock, stage 3 05/23/2022 05/23/2022 Resolved  Problems Electronic Signature(s) Signed: 11/14/2022 3:45:28 PM By: Fredirick Maudlin MD FACS Entered By: Fredirick Maudlin on 11/14/2022 15:45:28 Progress Note Details -------------------------------------------------------------------------------- Brian [name] ([id number]) [id number] Page 5 of 9 Patient Name: Date of Service: Brian Brian [name] 11/14/2022 2:00 PM Medical Record Number: [id number] Patient Account Number: [id number] Date of Birth/Sex: Treating RN: 1933/07/17 (89 y.o. [name]) Primary Care Provider: Abelino Derrick Other Clinician: Referring Provider: Treating Provider/Extender: Sandi Raveling in Treatment: 39 Subjective Chief Complaint Information obtained from Patient 08/05/2019; patient is here for review of pressure ulcers on his buttock 02/14/22: patient is here for review of pressure ulcers on his left elbow History of Present Illness (HPI) ADMISSION 08/05/19 This is an 89 year old man who arrives in clinic accompanied by his son. 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 Apparently noted recently to have a stage I pressure area on the left buttock. He does not have a wound history. He does have been air fluidized mattress. They have a wheelchair cushion as well as a pillow over the top of this. He is not incontinent of urine or bowel. Past medical history includes hypertension, osteoarthritis of the knee, atrial fibrillation, history of mitral valve repair, right hemisphere CVA with left hemiparesis, history of a DVT READMISSION 02/14/2022 The patient is here today for evaluation of pressure ulcers on his left elbow and possible right buttock ulcer. Apparently when he was seen by Dr. Dellia Nims in 2020, no ulcer was appreciated on the buttock and no further wound care involvement occurred. 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 He does have home health aides and they are turning him every 2 hours side to side. He is on a memory foam mattress at this time. 03/14/2022: The patient was scheduled to see me on July 11, but apparently his son panicked about some bright red blood per rectum and rushed him to the emergency room. There is also a comment in the electronic medical record that the patient's son thought the bone was  exposed on his elbow. After waiting a prolonged period of time at the emergency department, they left without being seen. Today, it appears that the dressing on the patient's elbow was never changed since our last visit. The 2 wounds have converged creating a larger wound. According to the intake nurse, there was a foul odor from the wound and dressing. There is extensive slough on the wound surface. The bleeding per rectum turns out to be hemorrhoids. The patient's son expresses that he does not know how to manage this. 04/07/2022: For unclear reasons, the patient has not returned to clinic until they were contacted today and they made an add-on visit. The patient's son was not originally present at the beginning of the visit but showed up about 25 minutes into the visit. The patient was accompanied by a home aide. She reported that when she cleaned the patient up today, there was just a Band-Aid on his wound. She said the surface was fairly mucky but she was able to wash this off. It is abundantly clear that this wound is not being properly cared for. Apparently he did have [facility] home health but they have not been out in some time and it is not clear the reason for this. The surface of the wound is a bit cleaner so perhaps the Santyl has been applied, but unfortunately there is now a deep tunnel that extends up from the elbow to the patient's posterior upper arm for a number centimeters. There is no evidence of infection or purulent  drainage. It is also clear that the patient continues to spend a substantial portion of his time leaning on the elbow. 05/23/2022: Once again, the patient has failed to return to clinic for about 6 weeks and the reason remains unclear. He is apparently receiving home health assistance. The wound on his elbow is covered with hypertrophic granulation tissue. He also has a blister just distal to the wound on his posterior forearm. He has a new stage III pressure ulcer on his left gluteus with slough accumulation. He apparently spends a substantial portion of his days sitting in a wheelchair. I do not believe he has a Financial trader. Due to his stroke, he lists to the left side, which is how his ulcer on his elbow developed. I suspect the gluteal ulcer is as a result of this, as well. 06/13/2022: The wound on his left gluteus is nearly healed. It is very superficial and quite clean. His son reports that he has just been applying Desitin to the area. The elbow wound is smaller but still has some hypertrophic granulation tissue and slough accumulation. No concern for infection at either site. 06/27/2022: The buttock wounds are healed. There is some tissue maceration without any significant skin breakdown. The elbow wound continues to contract. There is still some hypertrophic granulation tissue and slough buildup. 07/25/2022: The buttock wounds have reopened. The periwound skin has been kept very dry through liberal use of zinc oxide, however. The elbow wound dressing did not look like it had been changed in at least a week when it was removed. The underlying wound, however has not reaccumulated any hypertrophic granulation tissue; there is a thin layer of slough present. No malodor or purulent drainage from the wound itself. 08/15/2022: There is a tiny opening remaining on the buttocks. It is clean and there is no evidence of tissue maceration. The elbow looks very good. There is good beefy tissue present and  it is flush with the surrounding skin.  Home health has been coming out. 09/05/2022: The wound on his bottom is healed. The elbow shows signs of pressure induced deep tissue injury and also has some hypertrophic granulation tissue. 09/26/2022: The elbow looks much better today. There is a little bit of slough on the surface but there is no undermining or tunneling. He does have some hypertrophic granulation tissue accumulation. 10/17/2022: His elbow wound continues to improve. It is smaller, cleaner, and more superficial. There is hypertrophic granulation tissue present. 11/14/2022: His elbow ulcer is smaller again today. There is some slough on the surface. Patient History Information obtained from Patient. Family History Diabetes - Maternal Grandparents, Heart Disease - Mother, Hypertension - Mother, No family history of Cancer, Hereditary Spherocytosis, Kidney Disease, Lung Disease, Seizures, Stroke, Thyroid Problems, Tuberculosis. Social History Never smoker, Marital Status - Married, Alcohol Use - Never, Drug Use - No History, Caffeine Use - Never. Medical History Eyes Patient has history of Cataracts - removed Brian [name], Brian [name] ([id number]) [id number] Page 6 of 9 Cardiovascular Patient has history of Arrhythmia - Atrial Flutter/Atrial Fib, Coronary Artery Disease, Hypotension Denies history of Hypertension Musculoskeletal Patient has history of Osteoarthritis Neurologic Denies history of Paraplegia Hospitalization/Surgery History - open heart surgery. - IR generic histoircal. - appendectomy. - hernia repair. - joint replacement. - mitral valve repair. - mvp repair. - total hip arthroplasty. Medical A Surgical History Notes nd Constitutional Symptoms (General Health) obseity Ear/Nose/Mouth/Throat wears hearing aids Cardiovascular Mitral Valve Repair, Genitourinary BPH Neurologic CVA 2018, left side hemiparesis Objective Constitutional no acute  distress. Vitals Time Taken: 4:48 PM, Height: 66 in, Weight: 185 lbs, BMI: 29.9, Temperature: 98 F, Pulse: 78 bpm, Respiratory Rate: 18 breaths/min, Blood Pressure: 117/75 mmHg. Respiratory Normal work of breathing on room air. General Notes: 11/14/2022: His elbow ulcer is smaller again today. There is some slough on the surface. Integumentary (Hair, Skin) Wound #1 status is Open. Original cause of wound was Pressure Injury. The date acquired was: 12/19/2021. The wound has been in treatment 39 weeks. The wound is located on the Left Elbow. The wound measures 2cm length x 2.4cm width x 0.1cm depth; 3.77cm^2 area and 0.377cm^3 volume. There is Fat Layer (Subcutaneous Tissue) exposed. There is no tunneling noted, however, there is undermining starting at 12:00 and ending at 3:00 with a maximum distance of 0.4cm. There is a medium amount of serosanguineous drainage noted. The wound margin is distinct with the outline attached to the wound base. There is medium (34-66%) red, hyper - granulation within the wound bed. There is a medium (34-66%) amount of necrotic tissue within the wound bed including Adherent Slough. The periwound skin appearance had no abnormalities noted for texture. The periwound skin appearance had no abnormalities noted for moisture. The periwound skin appearance had no abnormalities noted for color. Periwound temperature was noted as No Abnormality. The periwound has tenderness on palpation. 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 There was a Excisional Skin/Subcutaneous Tissue Debridement with a total area of 4.8 sq cm performed by Fredirick Maudlin, MD.  With the following instrument(s): Curette Material removed includes Subcutaneous Tissue and Slough and after achieving pain control using Lidocaine 4% Topical Solution. No specimens were taken. A time out was conducted at 14:55, prior to the start  of the procedure. A Minimum amount of bleeding was controlled with Pressure. The procedure was tolerated well. Post Debridement Measurements: 2cm length x 2.4cm width x 0.1cm depth; 0.377cm^3 volume. Post debridement Stage noted as Category/Stage III. Character of Wound/Ulcer Post Debridement is improved. Post procedure Diagnosis Wound #1: Same as Pre-Procedure General Notes: scribed for Dr. Celine Ahr by Adline Peals, RN. Brian [name], Brian [name] ([id number]) [id number] Page 7 of 9 Plan Follow-up Appointments: Return appointment in 1 month. - Dr. Celine Ahr - room 2 12/09/2022 at 2:00 PM Anesthetic: (In clinic) Topical Lidocaine 4% applied to wound bed Bathing/ Shower/ Hygiene: May shower and wash wound with soap and water. - with dressing changes Off-Loading: Turn and reposition every 2 hours - stay off of left elbow wear prevalon boot on elbow as directed Other: - limit time in wheelchair, reposition in wheelchair every 1-2 hours Home Health: No change in wound care orders this week; continue Home Health for wound care. May utilize formulary equivalent dressing for wound treatment orders unless otherwise specified. Dressing changes to be completed by [facility] on Monday / Wednesday / Friday except when patient has scheduled visit at [facility]. Other Home Health Orders/Instructions: - [facility] The following medication(s) was prescribed: lidocaine topical 4 % cream cream topical was prescribed at facility WOUND #1: - Elbow Wound Laterality: Left Cleanser: Soap and Water Every Other Day/30 Days Discharge Instructions: May shower and wash wound with dial antibacterial soap and water prior to dressing change. Cleanser: Wound  Cleanser (Generic) Every Other Day/30 Days Discharge Instructions: Cleanse the wound with wound cleanser prior to applying a clean dressing using gauze sponges, not tissue or cotton balls. Prim Dressing: Hydrofera Blue Ready Transfer Foam, 4x5 (in/in) Every Other Day/30 Days ary Discharge Instructions: Apply to wound bed as instructed Secondary Dressing: ALLEVYN Gentle Border, 5x5 (in/in) (Generic) Every Other Day/30 Days Discharge Instructions: Apply over primary dressing as directed. Add-Ons: Cotton Tip Applicator, 6 (in) (Generic) Every Other Day/30 Days 11/14/2022: His elbow ulcer is smaller again today. There is some slough on the surface. I used a curette to debride slough and subcutaneous tissue from the wound. We will continue to use Hydrofera Blue. Offloading instructions were reviewed once again, but these are difficult for him to follow given his stroke. Follow-up in 1 month. Electronic Signature(s) Signed: 11/14/2022 3:47:27 PM By: Fredirick Maudlin MD FACS Entered By: Fredirick Maudlin on 11/14/2022 15:47:27 -------------------------------------------------------------------------------- HxROS Details Patient Name: Date of Service: Brian [name], Brian [name] 11/14/2022 2:00 PM Medical Record Number: [id number] Patient Account Number: [id number] Date of Birth/Sex: Treating RN: January 21, 1933 (89 y.o. [name]) Primary Care Provider: Abelino Derrick Other Clinician: Referring Provider: Treating Provider/Extender: Sandi Raveling in Treatment: 39 Information Obtained From Patient Constitutional Symptoms (General Health) Medical History: Past Medical History Notes: obseity Eyes Medical History: Positive for: Cataracts - removed Ear/Nose/Mouth/Throat Medical History: Past Medical History Notes: wears hearing aids Cardiovascular Medical History: Brian [name], Brian [name] ([id number]) [id number] Page 8 of 9 Positive for: Arrhythmia - Atrial  Flutter/Atrial Fib; Coronary Artery Disease; Hypotension Negative for: Hypertension Past Medical History Notes: Mitral Valve Repair, Genitourinary Medical History: Past Medical History Notes: BPH Musculoskeletal Medical History: Positive for: Osteoarthritis Neurologic Medical History: Negative for: Paraplegia Past Medical History Notes: CVA 2018, left side hemiparesis HBO Extended History Items Eyes: Cataracts Immunizations Pneumococcal Vaccine: Received Pneumococcal Vaccination: Yes Received Pneumococcal Vaccination On or After 60th Birthday: Yes Implantable Devices None Hospitalization / Surgery History Type of Hospitalization/Surgery open heart surgery IR generic histoircal appendectomy  hernia repair joint replacement mitral valve repair mvp repair total hip arthroplasty Family and Social History Cancer: No; Diabetes: Yes - Maternal Grandparents; Heart Disease: Yes - Mother; Hereditary Spherocytosis: No; Hypertension: Yes - Mother; Kidney Disease: No; Lung Disease: No; Seizures: No; Stroke: No; Thyroid Problems: No; Tuberculosis: No; Never smoker; Marital Status - Married; Alcohol Use: Never; Drug Use: No History; Caffeine Use: Never; Financial Concerns: No; Food, Clothing or Shelter Needs: No; Support System Lacking: No; Transportation Concerns: No Electronic Signature(s) Signed: 11/14/2022 4:30:18 PM By: Fredirick Maudlin MD FACS Entered By: Fredirick Maudlin on 11/14/2022 15:46:08 -------------------------------------------------------------------------------- SuperBill Details Patient Name: Date of Service: Brian Brian [name] 11/14/2022 Medical Record Number: [id number] Patient Account Number: [id number] Date of Birth/Sex: Treating RN: 1932/10/05 (89 y.o. 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 I48.19 Other persistent atrial fibrillation I25.10 Atherosclerotic heart disease of native coronary artery without angina pectoris Facility Procedures : CPT4 Code: [id number] Description: [id number] - DEB SUBQ TISSUE 20 SQ CM/< ICD-10 Diagnosis Description L89.023 Pressure ulcer of left elbow, stage 3 Modifier: Quantity: 1 Physician Procedures : CPT4 Code Description Modifier [id number] - WC PHYS LEVEL 3 - EST PT 25 ICD-10 Diagnosis Description L89.023 Pressure ulcer of left elbow, stage 3 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 I25.10 Atherosclerotic  heart disease of native coronary artery without angina pectoris I48.19 Other persistent atrial fibrillation Quantity: 1 : [id number] - WC PHYS SUBQ TISS 20 SQ CM ICD-10 Diagnosis Description L89.023 Pressure ulcer of left elbow, stage 3 Quantity: 1 Electronic Signature(s) Signed: 11/14/2022 3:48:38 PM By: Fredirick Maudlin MD FACS Entered By: Fredirick Maudlin on 11/14/2022 15:48:38

## 2022-11-15 DIAGNOSIS — I119 Hypertensive heart disease without heart failure: Secondary | ICD-10-CM | POA: Diagnosis not present

## 2022-11-15 DIAGNOSIS — L89023 Pressure ulcer of left elbow, stage 3: Secondary | ICD-10-CM | POA: Diagnosis not present

## 2022-11-15 DIAGNOSIS — I69354 Hemiplegia and hemiparesis following cerebral infarction affecting left non-dominant side: Secondary | ICD-10-CM | POA: Diagnosis not present

## 2022-11-15 DIAGNOSIS — I4819 Other persistent atrial fibrillation: Secondary | ICD-10-CM | POA: Diagnosis not present

## 2022-11-15 DIAGNOSIS — Z7901 Long term (current) use of anticoagulants: Secondary | ICD-10-CM | POA: Diagnosis not present

## 2022-11-15 DIAGNOSIS — I2510 Atherosclerotic heart disease of native coronary artery without angina pectoris: Secondary | ICD-10-CM | POA: Diagnosis not present

## 2022-11-17 DIAGNOSIS — I119 Hypertensive heart disease without heart failure: Secondary | ICD-10-CM | POA: Diagnosis not present

## 2022-11-17 DIAGNOSIS — I69354 Hemiplegia and hemiparesis following cerebral infarction affecting left non-dominant side: Secondary | ICD-10-CM | POA: Diagnosis not present

## 2022-11-17 DIAGNOSIS — I2510 Atherosclerotic heart disease of native coronary artery without angina pectoris: Secondary | ICD-10-CM | POA: Diagnosis not present

## 2022-11-17 DIAGNOSIS — Z7901 Long term (current) use of anticoagulants: Secondary | ICD-10-CM | POA: Diagnosis not present

## 2022-11-17 DIAGNOSIS — L89023 Pressure ulcer of left elbow, stage 3: Secondary | ICD-10-CM | POA: Diagnosis not present

## 2022-11-17 DIAGNOSIS — I4819 Other persistent atrial fibrillation: Secondary | ICD-10-CM | POA: Diagnosis not present

## 2022-11-21 ENCOUNTER — Other Ambulatory Visit: Payer: Self-pay | Admitting: Family Medicine

## 2022-11-21 DIAGNOSIS — I4819 Other persistent atrial fibrillation: Secondary | ICD-10-CM | POA: Diagnosis not present

## 2022-11-21 DIAGNOSIS — L89023 Pressure ulcer of left elbow, stage 3: Secondary | ICD-10-CM | POA: Diagnosis not present

## 2022-11-21 DIAGNOSIS — I69354 Hemiplegia and hemiparesis following cerebral infarction affecting left non-dominant side: Secondary | ICD-10-CM | POA: Diagnosis not present

## 2022-11-21 DIAGNOSIS — K219 Gastro-esophageal reflux disease without esophagitis: Secondary | ICD-10-CM

## 2022-11-21 DIAGNOSIS — I119 Hypertensive heart disease without heart failure: Secondary | ICD-10-CM | POA: Diagnosis not present

## 2022-11-21 DIAGNOSIS — I2510 Atherosclerotic heart disease of native coronary artery without angina pectoris: Secondary | ICD-10-CM | POA: Diagnosis not present

## 2022-11-21 DIAGNOSIS — Z7901 Long term (current) use of anticoagulants: Secondary | ICD-10-CM | POA: Diagnosis not present

## 2022-11-22 DIAGNOSIS — G894 Chronic pain syndrome: Secondary | ICD-10-CM | POA: Diagnosis not present

## 2022-11-22 DIAGNOSIS — K5909 Other constipation: Secondary | ICD-10-CM | POA: Diagnosis not present

## 2022-11-22 DIAGNOSIS — L89023 Pressure ulcer of left elbow, stage 3: Secondary | ICD-10-CM | POA: Diagnosis not present

## 2022-11-22 DIAGNOSIS — F32A Depression, unspecified: Secondary | ICD-10-CM | POA: Diagnosis not present

## 2022-11-22 DIAGNOSIS — Z7401 Bed confinement status: Secondary | ICD-10-CM | POA: Diagnosis not present

## 2022-11-22 DIAGNOSIS — I2510 Atherosclerotic heart disease of native coronary artery without angina pectoris: Secondary | ICD-10-CM | POA: Diagnosis not present

## 2022-11-22 DIAGNOSIS — I119 Hypertensive heart disease without heart failure: Secondary | ICD-10-CM | POA: Diagnosis not present

## 2022-11-22 DIAGNOSIS — I4819 Other persistent atrial fibrillation: Secondary | ICD-10-CM | POA: Diagnosis not present

## 2022-11-22 DIAGNOSIS — I69354 Hemiplegia and hemiparesis following cerebral infarction affecting left non-dominant side: Secondary | ICD-10-CM | POA: Diagnosis not present

## 2022-11-22 DIAGNOSIS — Z7901 Long term (current) use of anticoagulants: Secondary | ICD-10-CM | POA: Diagnosis not present

## 2022-11-23 DIAGNOSIS — Z79891 Long term (current) use of opiate analgesic: Secondary | ICD-10-CM | POA: Diagnosis not present

## 2022-11-23 DIAGNOSIS — Z952 Presence of prosthetic heart valve: Secondary | ICD-10-CM | POA: Diagnosis not present

## 2022-11-23 DIAGNOSIS — L89023 Pressure ulcer of left elbow, stage 3: Secondary | ICD-10-CM | POA: Diagnosis not present

## 2022-11-23 DIAGNOSIS — I69354 Hemiplegia and hemiparesis following cerebral infarction affecting left non-dominant side: Secondary | ICD-10-CM | POA: Diagnosis not present

## 2022-11-23 DIAGNOSIS — I2510 Atherosclerotic heart disease of native coronary artery without angina pectoris: Secondary | ICD-10-CM | POA: Diagnosis not present

## 2022-11-23 DIAGNOSIS — M179 Osteoarthritis of knee, unspecified: Secondary | ICD-10-CM | POA: Diagnosis not present

## 2022-11-23 DIAGNOSIS — Z86718 Personal history of other venous thrombosis and embolism: Secondary | ICD-10-CM | POA: Diagnosis not present

## 2022-11-23 DIAGNOSIS — I4819 Other persistent atrial fibrillation: Secondary | ICD-10-CM | POA: Diagnosis not present

## 2022-11-23 DIAGNOSIS — I119 Hypertensive heart disease without heart failure: Secondary | ICD-10-CM | POA: Diagnosis not present

## 2022-11-23 DIAGNOSIS — Z7901 Long term (current) use of anticoagulants: Secondary | ICD-10-CM | POA: Diagnosis not present

## 2022-11-24 DIAGNOSIS — I4819 Other persistent atrial fibrillation: Secondary | ICD-10-CM | POA: Diagnosis not present

## 2022-11-24 DIAGNOSIS — I2510 Atherosclerotic heart disease of native coronary artery without angina pectoris: Secondary | ICD-10-CM | POA: Diagnosis not present

## 2022-11-24 DIAGNOSIS — L89023 Pressure ulcer of left elbow, stage 3: Secondary | ICD-10-CM | POA: Diagnosis not present

## 2022-11-24 DIAGNOSIS — I119 Hypertensive heart disease without heart failure: Secondary | ICD-10-CM | POA: Diagnosis not present

## 2022-11-24 DIAGNOSIS — Z7901 Long term (current) use of anticoagulants: Secondary | ICD-10-CM | POA: Diagnosis not present

## 2022-11-24 DIAGNOSIS — I69354 Hemiplegia and hemiparesis following cerebral infarction affecting left non-dominant side: Secondary | ICD-10-CM | POA: Diagnosis not present

## 2022-11-29 DIAGNOSIS — Z7901 Long term (current) use of anticoagulants: Secondary | ICD-10-CM | POA: Diagnosis not present

## 2022-11-29 DIAGNOSIS — I4819 Other persistent atrial fibrillation: Secondary | ICD-10-CM | POA: Diagnosis not present

## 2022-11-29 DIAGNOSIS — I69354 Hemiplegia and hemiparesis following cerebral infarction affecting left non-dominant side: Secondary | ICD-10-CM | POA: Diagnosis not present

## 2022-11-29 DIAGNOSIS — I2510 Atherosclerotic heart disease of native coronary artery without angina pectoris: Secondary | ICD-10-CM | POA: Diagnosis not present

## 2022-11-29 DIAGNOSIS — I119 Hypertensive heart disease without heart failure: Secondary | ICD-10-CM | POA: Diagnosis not present

## 2022-11-29 DIAGNOSIS — L89023 Pressure ulcer of left elbow, stage 3: Secondary | ICD-10-CM | POA: Diagnosis not present

## 2022-12-03 ENCOUNTER — Other Ambulatory Visit: Payer: Self-pay | Admitting: Family Medicine

## 2022-12-03 DIAGNOSIS — F419 Anxiety disorder, unspecified: Secondary | ICD-10-CM

## 2022-12-05 ENCOUNTER — Encounter: Payer: Self-pay | Admitting: Student in an Organized Health Care Education/Training Program

## 2022-12-05 ENCOUNTER — Ambulatory Visit
Payer: Medicare Other | Attending: Student in an Organized Health Care Education/Training Program | Admitting: Student in an Organized Health Care Education/Training Program

## 2022-12-05 VITALS — BP 117/59 | HR 76 | Temp 99.1°F | Resp 16

## 2022-12-05 DIAGNOSIS — M51369 Other intervertebral disc degeneration, lumbar region without mention of lumbar back pain or lower extremity pain: Secondary | ICD-10-CM

## 2022-12-05 DIAGNOSIS — G8929 Other chronic pain: Secondary | ICD-10-CM | POA: Diagnosis not present

## 2022-12-05 DIAGNOSIS — M48062 Spinal stenosis, lumbar region with neurogenic claudication: Secondary | ICD-10-CM | POA: Insufficient documentation

## 2022-12-05 DIAGNOSIS — M5416 Radiculopathy, lumbar region: Secondary | ICD-10-CM | POA: Diagnosis not present

## 2022-12-05 DIAGNOSIS — M47816 Spondylosis without myelopathy or radiculopathy, lumbar region: Secondary | ICD-10-CM | POA: Insufficient documentation

## 2022-12-05 DIAGNOSIS — M5136 Other intervertebral disc degeneration, lumbar region: Secondary | ICD-10-CM | POA: Insufficient documentation

## 2022-12-05 DIAGNOSIS — G894 Chronic pain syndrome: Secondary | ICD-10-CM | POA: Insufficient documentation

## 2022-12-05 DIAGNOSIS — M170 Bilateral primary osteoarthritis of knee: Secondary | ICD-10-CM | POA: Diagnosis not present

## 2022-12-05 DIAGNOSIS — Z0289 Encounter for other administrative examinations: Secondary | ICD-10-CM | POA: Insufficient documentation

## 2022-12-05 MED ORDER — OXYCODONE-ACETAMINOPHEN 7.5-325 MG PO TABS
1.0000 | ORAL_TABLET | Freq: Two times a day (BID) | ORAL | 0 refills | Status: DC | PRN
Start: 2023-03-04 — End: 2023-03-27

## 2022-12-05 MED ORDER — PREGABALIN 25 MG PO CAPS
25.0000 mg | ORAL_CAPSULE | Freq: Every day | ORAL | 5 refills | Status: DC
Start: 2022-12-05 — End: 2023-03-27

## 2022-12-05 MED ORDER — OXYCODONE-ACETAMINOPHEN 7.5-325 MG PO TABS
1.0000 | ORAL_TABLET | Freq: Two times a day (BID) | ORAL | 0 refills | Status: AC | PRN
Start: 2023-01-03 — End: 2023-02-02

## 2022-12-05 MED ORDER — OXYCODONE-ACETAMINOPHEN 7.5-325 MG PO TABS
1.0000 | ORAL_TABLET | Freq: Two times a day (BID) | ORAL | 0 refills | Status: AC | PRN
Start: 2023-02-02 — End: 2023-03-04

## 2022-12-05 NOTE — Progress Notes (Signed)
Nursing Pain Medication Assessment:  Safety precautions to be maintained throughout the outpatient stay will include: orient to surroundings, keep bed in low position, maintain call bell within reach at all times, provide assistance with transfer out of bed and ambulation.  Medication Inspection Compliance: Mr. Therriault did not comply with our request to bring his pills to be counted. He was reminded that bringing the medication bottles, even when empty, is a requirement.  Medication: None brought in. Pill/Patch Count: None available to be counted. Bottle Appearance: No container available. Did not bring bottle(s) to appointment. Filled Date: N/A Last Medication intake:  Today Safety precautions to be maintained throughout the outpatient stay will include: orient to surroundings, keep bed in low position, maintain call bell within reach at all times, provide assistance with transfer out of bed and ambulation.   

## 2022-12-05 NOTE — Progress Notes (Signed)
PROVIDER NOTE: Information contained herein reflects review and annotations entered in association with encounter. Interpretation of such information and data should be left to medically-trained personnel. Information provided to patient can be located elsewhere in the medical record under "Patient Instructions". Document created using STT-dictation technology, any transcriptional errors that may result from process are unintentional.    Patient: Brian [name]  Service Category: E/M  Provider: Edward Jolly, MD  DOB: Jan 11, 1933  DOS: 12/05/2022  Referring Provider: Mliss Sax  MRN: [id number]  Specialty: Interventional Pain Management  PCP: Mliss Sax, MD  Type: Established Patient  Setting: Ambulatory outpatient    Location: Office  Delivery: Face-to-face     HPI  Brian [name], a 89 y.o. year old male, is here today because of his Chronic pain syndrome [G89.4]. Brian [name] primary complain today is Elbow Pain (left) and Back Pain (lower) Last encounter: My last encounter with him was on 09/12/22 Pertinent problems: Brian [name] has Dysarthria, post-stroke; Chronic radicular lumbar pain; Gait disturbance; Paraplegic immobility syndrome; History of cerebrovascular accident; Depression with anxiety; Lumbar spondylosis; Lumbar degenerative disc disease; Bilateral primary osteoarthritis of knee; Pain management contract signed; and Chronic pain syndrome on their pertinent problem list. Pain Assessment: Severity of Chronic pain is reported as a 5 /10. Location: Back Lower/ . Onset: More than a month ago. Quality: Discomfort. Timing: Constant. Modifying factor(s): medications. Vitals:  temporal temperature is 99.1 F (37.3 C). His blood pressure is 117/59 (abnormal) and his pulse is 76. His respiration is 16 and oxygen saturation is 95%.   Reason for encounter: medication management.   No significant change in Brian [name] history. Continues to endorse analgesic and functional  benefit with Percocet that he takes once in the morning and once in the evening after a meal along with Lyrica 25 mg which he takes at night.  No cognitive side effects, gait disturbances, GI side effects.  We will continue as prescribed.  Patient's son in room.  04/11/22: 2nd pt visit No significant change in his medical history.  Patient unable to submit a urine toxicology screen so we obtained a serum drug screen. We will have patient sign pain contract.  He was accompanied today by his son and their CMA Prescription for Percocet as below 7.5 mg twice daily as needed which was being managed by their primary care provider, Dr. Doreene Burke Also recommend restarting Lyrica which the patient was on before, 25 mg nightly as a pain adjunct.   HPI from initial clinic visit: Mr. Wadlow is a pleasant 89 year old male who is accompanied today by his son and his nursing aide.  He has a longstanding history of diffuse arthritis most pronounced in his glenohumeral joints and knees.  He has tried physical therapy and continues to engage in participate in physical therapy twice a week.  He has a history of stroke with associated hemiparesis.  He has significant thoracic kyphosis.  He has difficulty ambulating and presents today in a wheelchair.  He also has bilateral hip osteoarthritis.  He was previously on Percocet 5 mg twice a day which became less effective and as a result, his primary care provider increased it to 7.5 mg twice a day which he is not getting any benefit from.  He is currently anticoagulated on Eliquis for his coronary artery disease.  He is also on Lyrica 25 mg twice a day.  He is also on Seroquel and Zoloft for depression management.    Pharmacotherapy Assessment  Analgesic:  11/17/2022 06/06/2022  1 Pregabalin 25 Mg Capsule 30.00 30 Bi Lat [id number] [facility] (8425) 1/3 0.17 LME Medicare [place]  11/05/2022 09/12/2022  1 Oxycodone-Acetaminophn 7.5-325 60.00 30 Bi Lat [id number] [facility] (8425) 0/0 22.50 MME Medicare  [place]    Monitoring: [facility] PMP: PDMP reviewed during this encounter.       Pharmacotherapy: No side-effects or adverse reactions reported. Compliance: No problems identified. Effectiveness: Clinically acceptable.  Brian Elk, RN  12/05/2022  2:23 PM  Sign when Signing Visit Nursing Pain Medication Assessment:  Safety precautions to be maintained throughout the outpatient stay will include: orient to surroundings, keep bed in low position, maintain call bell within reach at all times, provide assistance with transfer out of bed and ambulation.  Medication Inspection Compliance: Brian [name] did not comply with our request to bring his pills to be counted. He was reminded that bringing the medication bottles, even when empty, is a requirement.  Medication: None brought in. Pill/Patch Count: None available to be counted. Bottle Appearance: No container available. Did not bring bottle(s) to appointment. Filled Date: N/A Last Medication intake:  Today Safety precautions to be maintained throughout the outpatient stay will include: orient to surroundings, keep bed in low position, maintain call bell within reach at all times, provide assistance with transfer out of bed and ambulation.     No results found for: "CBDTHCR" No results found for: "D8THCCBX" No results found for: "D9THCCBX"  UDS:  Summary  Date Value Ref Range Status  12/27/2021 Note  Final    Comment:    ==================================================================== Compliance Drug Analysis, Ur ==================================================================== Test                             Result       Flag       Units  Drug Present and Declared for Prescription Verification   Oxycodone                      2038         EXPECTED   ng/mg creat   Oxymorphone                    6930         EXPECTED   ng/mg creat   Noroxycodone                   746          EXPECTED   ng/mg creat   Noroxymorphone                 488           EXPECTED   ng/mg creat    Sources of oxycodone are scheduled prescription medications.    Oxymorphone, noroxycodone, and noroxymorphone are expected    metabolites of oxycodone. Oxymorphone is also available as a    scheduled prescription medication.    Pregabalin                     PRESENT      EXPECTED   Sertraline                     PRESENT      EXPECTED   Desmethylsertraline            PRESENT      EXPECTED    Desmethylsertraline is an expected metabolite of sertraline.  Quetiapine                     PRESENT      EXPECTED   Acetaminophen                  PRESENT      EXPECTED  Drug Absent but Declared for Prescription Verification   Alprazolam                     Not Detected UNEXPECTED ng/mg creat   Metoprolol                     Not Detected UNEXPECTED ==================================================================== Test                      Result    Flag   Units      Ref Range   Creatinine              100              mg/dL      >=54 ==================================================================== Declared Medications:  The flagging and interpretation on this report are based on the  following declared medications.  Unexpected results may arise from  inaccuracies in the declared medications.   **Note: The testing scope of this panel includes these medications:   Alprazolam (Xanax)  Metoprolol  Oxycodone (Percocet)  Pregabalin (Lyrica)  Quetiapine (Seroquel)  Sertraline (Zoloft)   **Note: The testing scope of this panel does not include small to  moderate amounts of these reported medications:   Acetaminophen (Tylenol)  Acetaminophen (Percocet)   **Note: The testing scope of this panel does not include the  following reported medications:   Apixaban (Eliquis)  Cetirizine (Zyrtec)  Finasteride (Proscar)  Losartan (Cozaar)  Multivitamin  Pantoprazole (Protonix)  Polyethylene Glycol (MiraLAX)  Pravastatin  Tamsulosin  (Flomax) ==================================================================== For clinical consultation, please call [phone]. ====================================================================       ROS  Constitutional: Denies any fever or chills Gastrointestinal: No reported hemesis, hematochezia, vomiting, or acute GI distress Musculoskeletal:  Diffuse musculoskeletal pain Neurological:  Dysarthria and dysphagia from previous stroke  Medication Review  ALPRAZolam, Mupirocin, QUEtiapine, apixaban, cetirizine, finasteride, fluticasone, hydrocortisone ointment, melatonin, metoprolol succinate, multivitamin, oxyCODONE-acetaminophen, pantoprazole, polyethylene glycol powder, pravastatin, pregabalin, sertraline, and tamsulosin  History Review  Allergy: Brian [name] is allergic to novocain [procaine hcl], other, and penicillins. Drug: Brian [name]  reports no history of drug use. Alcohol:  reports no history of alcohol use. Tobacco:  reports that he has quit smoking. He has never used smokeless tobacco. Social: Brian [name]  reports that he has quit smoking. He has never used smokeless tobacco. He reports that he does not drink alcohol and does not use drugs. Medical:  has a past medical history of Anemia, Atrial flutter, BPH (benign prostatic hypertrophy), Epistaxis, Hemorrhage of gastrointestinal tract, unspecified, History of open heart surgery, Hypertension, Obesity (BMI 30.0-34.9) (06/29/2020), Osteoarthrosis, unspecified whether generalized or localized, unspecified site, Personal history of venous thrombosis and embolism, Rosacea, Stroke, and TIA (transient ischemic attack). Surgical: Brian [name]  has a past surgical history that includes Appendectomy; Hernia repair; Total hip arthroplasty; Joint replacement; mvp repair; Mitral valve repair; Radiology with anesthesia (N/A, 05/04/2016); ir generic historical (05/04/2016); ir generic historical (05/04/2016); and ir generic historical  (06/07/2016). Family: family history includes Coronary artery disease in his father; Rheumatic fever in his mother.  Laboratory Chemistry Profile   Renal Lab Results  Component Value Date  BUN 18 03/14/2022   CREATININE 0.98 03/14/2022   BCR 19 02/06/2020   GFR 84.36 11/01/2021   GFRAA >60 11/03/2018   GFRNONAA >60 03/14/2022    Hepatic Lab Results  Component Value Date   AST 25 03/14/2022   ALT 16 03/14/2022   ALBUMIN 3.2 (L) 03/14/2022   ALKPHOS 68 03/14/2022   LIPASE 23 03/14/2022    Electrolytes Lab Results  Component Value Date   NA 137 03/14/2022   K 3.7 03/14/2022   CL 104 03/14/2022   CALCIUM 8.8 (L) 03/14/2022   MG 1.7 03/15/2017   PHOS 2.4 (L) 05/07/2016    Bone No results found for: "VD25OH", "VD125OH2TOT", "WJ1914NW2", "NF6213YQ6", "25OHVITD1", "25OHVITD2", "25OHVITD3", "TESTOFREE", "TESTOSTERONE"  Inflammation (CRP: Acute Phase) (ESR: Chronic Phase) Lab Results  Component Value Date   LATICACIDVEN 1.9 03/15/2022         Note: Above Lab results reviewed.  Recent Imaging Review  ECHOCARDIOGRAM COMPLETE    ECHOCARDIOGRAM REPORT       Patient Name:   Kalon ESTANISLADO SURGEON Date of Exam: 03/15/2022 Medical Rec #:  [id number]      Height:       68.0 in Accession #:    [id number]     Weight:       185.0 lb Date of Birth:  Oct 27, 1932      BSA:          1.977 m Patient Age:    89 years       BP:           125/64 mmHg Patient Gender: M              HR:           98 bpm. Exam Location:  Inpatient  Procedure: 2D Echo, Cardiac Doppler and Color Doppler                                MODIFIED REPORT: This report was modified by Weston Brass MD on 03/15/2022 due to revision.  Indications:     Syncope   History:         Patient has no prior history of Echocardiogram examinations.                  CAD, Arrythmias:Atrial Fibrillation; Risk Factors:Hypertension.                    Mitral Valve: unknown size prosthetic annuloplasty ring valve                  is  present in the mitral position. Procedure Date: 2002 [facility].   Sonographer:     Cleatis Polka Referring Phys:  [id number] Edsel Petrin M PATEL Diagnosing Phys: Weston Brass MD  IMPRESSIONS   1. Left ventricular ejection fraction, by estimation, is 55 to 60%. The left ventricle has normal function. The left ventricle has no regional wall motion abnormalities. There is mild left ventricular hypertrophy. Left ventricular diastolic parameters  are indeterminate.  2. Right ventricular systolic function is normal. The right ventricular size is mildly enlarged. There is moderately elevated pulmonary artery systolic pressure. The estimated right ventricular systolic pressure is 48.4 mmHg.  3. Left atrial size was severely dilated.  4. Right atrial size was severely dilated.  5. The mitral valve has been repaired/replaced. Mild mitral valve regurgitation. The mean mitral valve gradient is 3.6 mmHg with average heart rate of 70 bpm.  There is a unknown size prosthetic annuloplasty ring present in the mitral position. Procedure  Date: 2002 [facility]. Echo findings are consistent with normal structure and function of the mitral valve prosthesis.  6. Tricuspid valve regurgitation is severe.  7. The aortic valve is abnormal. There is mild calcification of the aortic valve. Aortic valve regurgitation is moderate. No aortic stenosis is present.  8. The inferior vena cava is dilated in size with <50% respiratory variability, suggesting right atrial pressure of 15 mmHg.  FINDINGS  Left Ventricle: Left ventricular ejection fraction, by estimation, is 55 to 60%. The left ventricle has normal function. The left ventricle has no regional wall motion abnormalities. The left ventricular internal cavity size was normal in size. There is  mild left ventricular hypertrophy. Abnormal (paradoxical) septal motion consistent with post-operative status. Left ventricular diastolic parameters are indeterminate.  Right Ventricle: The right  ventricular size is mildly enlarged. No increase in right ventricular wall thickness. Right ventricular systolic function is normal. There is moderately elevated pulmonary artery systolic pressure. The tricuspid regurgitant  velocity is 2.89 m/s, and with an assumed right atrial pressure of 15 mmHg, the estimated right ventricular systolic pressure is 48.4 mmHg.  Left Atrium: Left atrial size was severely dilated.  Right Atrium: Right atrial size was severely dilated.  Pericardium: There is no evidence of pericardial effusion.  Mitral Valve: The mitral valve has been repaired/replaced. Mild mitral valve regurgitation. There is a unknown size prosthetic annuloplasty ring present in the mitral position. Procedure Date: 2002 [facility]. Echo findings are consistent with normal structure  and function of the mitral valve prosthesis. The mean mitral valve gradient is 3.6 mmHg with average heart rate of 70 bpm.  Tricuspid Valve: The tricuspid valve is normal in structure. Tricuspid valve regurgitation is severe. No evidence of tricuspid stenosis.  Aortic Valve: The aortic valve is abnormal. There is mild calcification of the aortic valve. Aortic valve regurgitation is moderate. Aortic regurgitation PHT measures 558 msec. No aortic stenosis is present. Aortic valve peak gradient measures 9.5 mmHg.  Pulmonic Valve: The pulmonic valve was normal in structure. Pulmonic valve regurgitation is mild. No evidence of pulmonic stenosis.  Aorta: The aortic root is normal in size and structure. Ascending aorta measurements are within normal limits for age when indexed to body surface area.  Venous: The inferior vena cava is dilated in size with less than 50% respiratory variability, suggesting right atrial pressure of 15 mmHg.  IAS/Shunts: No atrial level shunt detected by color flow Doppler.    LEFT VENTRICLE PLAX 2D LVIDd:         3.90 cm      Diastology LVIDs:         3.00 cm      LV e' medial:    6.31  cm/s LV PW:         1.10 cm      LV E/e' medial:  25.0 LV IVS:        1.10 cm      LV e' lateral:   7.51 cm/s LVOT diam:     2.20 cm      LV E/e' lateral: 21.0 LV SV:         72 LV SV Index:   36 LVOT Area:     3.80 cm   LV Volumes (MOD) LV vol d, MOD A2C: 107.0 ml LV vol d, MOD A4C: 120.0 ml LV vol s, MOD A2C: 45.4 ml LV vol s, MOD A4C: 49.9 ml  LV SV MOD A2C:     61.6 ml LV SV MOD A4C:     120.0 ml LV SV MOD BP:      68.1 ml  RIGHT VENTRICLE            IVC RV Basal diam:  4.30 cm    IVC diam: 2.20 cm RV S prime:     9.25 cm/s TAPSE (M-mode): 1.4 cm  LEFT ATRIUM             Index        RIGHT ATRIUM           Index LA diam:        4.50 cm 2.28 cm/m   RA Area:     18.70 cm LA Vol (A2C):   89.3 ml 45.17 ml/m  RA Volume:   48.60 ml  24.58 ml/m LA Vol (A4C):   72.5 ml 36.67 ml/m LA Biplane Vol: 85.0 ml 42.99 ml/m  AORTIC VALVE AV Area (Vmax): 2.57 cm AV Vmax:        154.00 cm/s AV Peak Grad:   9.5 mmHg LVOT Vmax:      104.00 cm/s LVOT Vmean:     72.400 cm/s LVOT VTI:       0.189 m AI PHT:         558 msec   AORTA Ao Root diam: 3.70 cm Ao Asc diam:  3.40 cm  MITRAL VALVE                TRICUSPID VALVE MV Area (PHT): 5.09 cm     TR Peak grad:   33.4 mmHg MV Mean grad:  3.6 mmHg     TR Vmax:        289.00 cm/s MV Decel Time: 149 msec MV E velocity: 158.00 cm/s  SHUNTS                             Systemic VTI:  0.19 m                             Systemic Diam: 2.20 cm  Weston Brass MD Electronically signed by Weston Brass MD Signature Date/Time: 03/15/2022/4:12:04 PM      Final (Updated)   CT ABDOMEN PELVIS W CONTRAST CLINICAL DATA:  Syncope, hypotension, abdominal pain.  EXAM: CT ANGIOGRAPHY CHEST  CT ABDOMEN AND PELVIS WITH CONTRAST  TECHNIQUE: Multidetector CT imaging of the chest was performed using the standard protocol during bolus administration of intravenous contrast. Multiplanar CT image reconstructions and MIPs were obtained to  evaluate the vascular anatomy. Multidetector CT imaging of the abdomen and pelvis was performed using the standard protocol during bolus administration of intravenous contrast.  RADIATION DOSE REDUCTION: This exam was performed according to the departmental dose-optimization program which includes automated exposure control, adjustment of the mA and/or kV according to patient size and/or use of iterative reconstruction technique.  CONTRAST:  OMNIPAQUE IOHEXOL 350 MG/ML SOLN  COMPARISON:  Chest radiograph dated 03/14/2022  FINDINGS: CTA CHEST FINDINGS  Cardiovascular: Satisfactory opacification of the bilateral pulmonary arteries to the segmental level. No evidence of pulmonary embolism.  Study is not tailored for evaluation of the thoracic aorta. No evidence of thoracic aortic aneurysm. Atherosclerotic calcifications of the arch.  Cardiomegaly.  No pericardial effusion.  Prosthetic mitral valve.  Coronary atherosclerosis of the LAD.  Mediastinum/Nodes: No suspicious mediastinal lymphadenopathy.  Visualized  thyroid is unremarkable.  Lungs/Pleura: Eventration of left hemidiaphragm with associated lingular and left lower lobe atelectasis.  Mild atelectasis in the posterior upper lobes and medial right lower lobe.  No focal consolidation.  Mild centrilobular and paraseptal emphysematous changes, upper lung predominant.  Evaluation lung parenchyma is constrained by respiratory motion. Within that constraint, there are no suspicious pulmonary nodules.  No pleural effusion or pneumothorax.  Musculoskeletal: Mild degenerative changes of the lower thoracic spine. Vertebral hemangioma at T3. Median sternotomy.  Review of the MIP images confirms the above findings.  CT ABDOMEN and PELVIS FINDINGS  Hepatobiliary: Scattered hepatic cysts measuring up to 19 mm in segment 4.  Gallbladder is unremarkable. No intrahepatic or extrahepatic ductal dilatation.  Pancreas:  Mild parenchymal atrophy.  Spleen: Within normal limits.  Adrenals/Urinary Tract: Adrenal glands are within normal limits.  Bilateral renal cysts, measuring up to 3.2 cm in the right upper kidney (series 5/image 23), benign (Bosniak II). No hydronephrosis.  Bladder is within normal limits, noting streak artifact.  Stomach/Bowel: Stomach is within normal limits.  No evidence of bowel obstruction.  Appendix is not discretely visualized.  Scattered colonic diverticulosis, without evidence of diverticulitis.  Vascular/Lymphatic: No evidence of abdominal aortic aneurysm.  Atherosclerotic calcifications of the abdominal aorta and branch vessels.  No suspicious abdominopelvic lymphadenopathy.  Reproductive: Prostatomegaly, suggesting BPH.  Other: No abdominopelvic ascites.  Mild presacral fluid/stranding (series 3/image 63).  Musculoskeletal: Right hip arthroplasty, without evidence of complication. Degenerative changes of the visualized thoracolumbar spine. Median sternotomy.  Review of the MIP images confirms the above findings.  IMPRESSION: No evidence of pulmonary embolism. No evidence of acute cardiopulmonary disease.  No acute findings in the abdomen/pelvis.  Additional ancillary findings as above.  Electronically Signed   By: Charline Bills M.D.   On: 03/15/2022 00:39 CT Angio Chest PE W and/or Wo Contrast CLINICAL DATA:  Syncope, hypotension, abdominal pain.  EXAM: CT ANGIOGRAPHY CHEST  CT ABDOMEN AND PELVIS WITH CONTRAST  TECHNIQUE: Multidetector CT imaging of the chest was performed using the standard protocol during bolus administration of intravenous contrast. Multiplanar CT image reconstructions and MIPs were obtained to evaluate the vascular anatomy. Multidetector CT imaging of the abdomen and pelvis was performed using the standard protocol during bolus administration of intravenous contrast.  RADIATION DOSE REDUCTION: This exam was  performed according to the departmental dose-optimization program which includes automated exposure control, adjustment of the mA and/or kV according to patient size and/or use of iterative reconstruction technique.  CONTRAST:  OMNIPAQUE IOHEXOL 350 MG/ML SOLN  COMPARISON:  Chest radiograph dated 03/14/2022  FINDINGS: CTA CHEST FINDINGS  Cardiovascular: Satisfactory opacification of the bilateral pulmonary arteries to the segmental level. No evidence of pulmonary embolism.  Study is not tailored for evaluation of the thoracic aorta. No evidence of thoracic aortic aneurysm. Atherosclerotic calcifications of the arch.  Cardiomegaly.  No pericardial effusion.  Prosthetic mitral valve.  Coronary atherosclerosis of the LAD.  Mediastinum/Nodes: No suspicious mediastinal lymphadenopathy.  Visualized thyroid is unremarkable.  Lungs/Pleura: Eventration of left hemidiaphragm with associated lingular and left lower lobe atelectasis.  Mild atelectasis in the posterior upper lobes and medial right lower lobe.  No focal consolidation.  Mild centrilobular and paraseptal emphysematous changes, upper lung predominant.  Evaluation lung parenchyma is constrained by respiratory motion. Within that constraint, there are no suspicious pulmonary nodules.  No pleural effusion or pneumothorax.  Musculoskeletal: Mild degenerative changes of the lower thoracic spine. Vertebral hemangioma at T3. Median sternotomy.  Review of the MIP  images confirms the above findings.  CT ABDOMEN and PELVIS FINDINGS  Hepatobiliary: Scattered hepatic cysts measuring up to 19 mm in segment 4.  Gallbladder is unremarkable. No intrahepatic or extrahepatic ductal dilatation.  Pancreas: Mild parenchymal atrophy.  Spleen: Within normal limits.  Adrenals/Urinary Tract: Adrenal glands are within normal limits.  Bilateral renal cysts, measuring up to 3.2 cm in the right upper kidney (series 5/image  23), benign (Bosniak II). No hydronephrosis.  Bladder is within normal limits, noting streak artifact.  Stomach/Bowel: Stomach is within normal limits.  No evidence of bowel obstruction.  Appendix is not discretely visualized.  Scattered colonic diverticulosis, without evidence of diverticulitis.  Vascular/Lymphatic: No evidence of abdominal aortic aneurysm.  Atherosclerotic calcifications of the abdominal aorta and branch vessels.  No suspicious abdominopelvic lymphadenopathy.  Reproductive: Prostatomegaly, suggesting BPH.  Other: No abdominopelvic ascites.  Mild presacral fluid/stranding (series 3/image 63).  Musculoskeletal: Right hip arthroplasty, without evidence of complication. Degenerative changes of the visualized thoracolumbar spine. Median sternotomy.  Review of the MIP images confirms the above findings.  IMPRESSION: No evidence of pulmonary embolism. No evidence of acute cardiopulmonary disease.  No acute findings in the abdomen/pelvis.  Additional ancillary findings as above.  Electronically Signed   By: Charline Bills M.D.   On: 03/15/2022 00:39 CT Head Wo Contrast CLINICAL DATA:  Mental status change  EXAM: CT HEAD WITHOUT CONTRAST  TECHNIQUE: Contiguous axial images were obtained from the base of the skull through the vertex without intravenous contrast.  RADIATION DOSE REDUCTION: This exam was performed according to the departmental dose-optimization program which includes automated exposure control, adjustment of the mA and/or kV according to patient size and/or use of iterative reconstruction technique.  COMPARISON:  CT brain 10/31/2017 report, 05/10/2016  FINDINGS: Brain: No acute territorial infarction, hemorrhage or intracranial mass. Chronic right MCA infarct with encephalomalacia and ex vacuo dilatation of the right ventricle. Small chronic appearing infarct within the left posterior parietal white matter. Atrophy.  Mild chronic small vessel ischemic changes of the white matter.  Vascular: No hyperdense vessels.  No unexpected calcification  Skull: Normal. Negative for fracture or focal lesion.  Sinuses/Orbits: No acute finding.  Other: None.  IMPRESSION: 1. No CT evidence for acute intracranial abnormality. 2. Chronic right MCA infarct. Atrophy and mild chronic small vessel ischemic changes of the white matter.  Electronically Signed   By: Jasmine Pang M.D.   On: 03/15/2022 00:14 Note: Reviewed        Physical Exam  General appearance: Well nourished, well developed, and well hydrated. In no apparent acute distress Mental status: Alert, oriented x 3 (person, place, & time)       Respiratory: No evidence of acute respiratory distress Eyes: PERLA Vitals: BP (!) 117/59   Pulse 76   Temp 99.1 F (37.3 C) (Temporal)   Resp 16   SpO2 95%  BMI: Estimated body mass index is 29.86 kg/m as calculated from the following:   Height as of 09/12/22: 5\' 6"  (1.676 m).   Weight as of 09/12/22: 185 lb (83.9 kg). Ideal: Patient weight not recorded  Patient is severely deconditioned, severe thoracic kyphosis and scoliosis.  Presents in wheelchair. Severe deconditioning.  Assessment   Diagnosis Status  1. Chronic pain syndrome   2. Lumbar spondylosis   3. Chronic radicular lumbar pain   4. Lumbar degenerative disc disease   5. Bilateral primary osteoarthritis of knee   6. Spinal stenosis, lumbar region, with neurogenic claudication   7. Pain management contract signed  Controlled Controlled Controlled   Plan of Care    Brian [name] has a current medication list which includes the following long-term medication(s): cetirizine, eliquis, metoprolol succinate, pantoprazole, pravastatin, quetiapine, sertraline, and pregabalin.  Pharmacotherapy (Medications Ordered): Meds ordered this encounter  Medications   oxyCODONE-acetaminophen (PERCOCET) 7.5-325 MG tablet    Sig: Take 1 tablet  by mouth every 12 (twelve) hours as needed for severe pain.    Dispense:  60 tablet    Refill:  0   oxyCODONE-acetaminophen (PERCOCET) 7.5-325 MG tablet    Sig: Take 1 tablet by mouth every 12 (twelve) hours as needed for severe pain.    Dispense:  60 tablet    Refill:  0   oxyCODONE-acetaminophen (PERCOCET) 7.5-325 MG tablet    Sig: Take 1 tablet by mouth every 12 (twelve) hours as needed for severe pain.    Dispense:  60 tablet    Refill:  0   pregabalin (LYRICA) 25 MG capsule    Sig: Take 1 capsule (25 mg total) by mouth at bedtime.    Dispense:  30 capsule    Refill:  5   Orders:  No orders of the defined types were placed in this encounter.  Follow-up plan:   Return in about 4 months (around 03/29/2023) for Medication Management, in person.    Recent Visits Date Type Provider Dept  09/12/22 Office Visit Edward Jolly, MD [facility]  Showing recent visits within past 90 days and meeting all other requirements Today's Visits Date Type Provider Dept  12/05/22 Office Visit Edward Jolly, MD [facility]  Showing today's visits and meeting all other requirements Future Appointments No visits were found meeting these conditions. Showing future appointments within next 90 days and meeting all other requirements  I discussed the assessment and treatment plan with the patient. The patient was provided an opportunity to ask questions and all were answered. The patient agreed with the plan and demonstrated an understanding of the instructions.  Patient advised to call back or seek an in-person evaluation if the symptoms or condition worsens.  Duration of encounter: .  Total time on encounter, as per AMA guidelines included both the face-to-face and non-face-to-face time personally spent by the physician and/or other qualified health care professional(s) on the day of the encounter (includes time in activities that require the physician or other qualified  health care professional and does not include time in activities normally performed by clinical staff). Physician's time may include the following activities when performed: preparing to see the patient (eg, review of tests, pre-charting review of records) obtaining and/or reviewing separately obtained history performing a medically appropriate examination and/or evaluation counseling and educating the patient/family/caregiver ordering medications, tests, or procedures referring and communicating with other health care professionals (when not separately reported) documenting clinical information in the electronic or other health record independently interpreting results (not separately reported) and communicating results to the patient/ family/caregiver care coordination (not separately reported)  Note by: Edward Jolly, MD Date: 12/05/2022; Time: 4:01 PM

## 2022-12-08 DIAGNOSIS — I2510 Atherosclerotic heart disease of native coronary artery without angina pectoris: Secondary | ICD-10-CM | POA: Diagnosis not present

## 2022-12-08 DIAGNOSIS — I4819 Other persistent atrial fibrillation: Secondary | ICD-10-CM | POA: Diagnosis not present

## 2022-12-08 DIAGNOSIS — L89023 Pressure ulcer of left elbow, stage 3: Secondary | ICD-10-CM | POA: Diagnosis not present

## 2022-12-08 DIAGNOSIS — Z7901 Long term (current) use of anticoagulants: Secondary | ICD-10-CM | POA: Diagnosis not present

## 2022-12-08 DIAGNOSIS — I69354 Hemiplegia and hemiparesis following cerebral infarction affecting left non-dominant side: Secondary | ICD-10-CM | POA: Diagnosis not present

## 2022-12-08 DIAGNOSIS — I119 Hypertensive heart disease without heart failure: Secondary | ICD-10-CM | POA: Diagnosis not present

## 2022-12-12 DIAGNOSIS — I69354 Hemiplegia and hemiparesis following cerebral infarction affecting left non-dominant side: Secondary | ICD-10-CM | POA: Diagnosis not present

## 2022-12-12 DIAGNOSIS — Z7901 Long term (current) use of anticoagulants: Secondary | ICD-10-CM | POA: Diagnosis not present

## 2022-12-12 DIAGNOSIS — I4819 Other persistent atrial fibrillation: Secondary | ICD-10-CM | POA: Diagnosis not present

## 2022-12-12 DIAGNOSIS — I2510 Atherosclerotic heart disease of native coronary artery without angina pectoris: Secondary | ICD-10-CM | POA: Diagnosis not present

## 2022-12-12 DIAGNOSIS — I119 Hypertensive heart disease without heart failure: Secondary | ICD-10-CM | POA: Diagnosis not present

## 2022-12-12 DIAGNOSIS — L89023 Pressure ulcer of left elbow, stage 3: Secondary | ICD-10-CM | POA: Diagnosis not present

## 2022-12-14 DIAGNOSIS — L89023 Pressure ulcer of left elbow, stage 3: Secondary | ICD-10-CM | POA: Diagnosis not present

## 2022-12-14 DIAGNOSIS — I2510 Atherosclerotic heart disease of native coronary artery without angina pectoris: Secondary | ICD-10-CM | POA: Diagnosis not present

## 2022-12-14 DIAGNOSIS — I119 Hypertensive heart disease without heart failure: Secondary | ICD-10-CM | POA: Diagnosis not present

## 2022-12-14 DIAGNOSIS — I4819 Other persistent atrial fibrillation: Secondary | ICD-10-CM | POA: Diagnosis not present

## 2022-12-14 DIAGNOSIS — I69354 Hemiplegia and hemiparesis following cerebral infarction affecting left non-dominant side: Secondary | ICD-10-CM | POA: Diagnosis not present

## 2022-12-14 DIAGNOSIS — Z7901 Long term (current) use of anticoagulants: Secondary | ICD-10-CM | POA: Diagnosis not present

## 2022-12-18 DIAGNOSIS — I4819 Other persistent atrial fibrillation: Secondary | ICD-10-CM | POA: Diagnosis not present

## 2022-12-18 DIAGNOSIS — Z7901 Long term (current) use of anticoagulants: Secondary | ICD-10-CM | POA: Diagnosis not present

## 2022-12-18 DIAGNOSIS — I69354 Hemiplegia and hemiparesis following cerebral infarction affecting left non-dominant side: Secondary | ICD-10-CM | POA: Diagnosis not present

## 2022-12-18 DIAGNOSIS — I119 Hypertensive heart disease without heart failure: Secondary | ICD-10-CM | POA: Diagnosis not present

## 2022-12-18 DIAGNOSIS — I2510 Atherosclerotic heart disease of native coronary artery without angina pectoris: Secondary | ICD-10-CM | POA: Diagnosis not present

## 2022-12-18 DIAGNOSIS — L89023 Pressure ulcer of left elbow, stage 3: Secondary | ICD-10-CM | POA: Diagnosis not present

## 2022-12-19 ENCOUNTER — Encounter (HOSPITAL_BASED_OUTPATIENT_CLINIC_OR_DEPARTMENT_OTHER): Payer: Medicare Other | Attending: General Surgery | Admitting: General Surgery

## 2022-12-19 DIAGNOSIS — I10 Essential (primary) hypertension: Secondary | ICD-10-CM | POA: Diagnosis not present

## 2022-12-19 DIAGNOSIS — I69354 Hemiplegia and hemiparesis following cerebral infarction affecting left non-dominant side: Secondary | ICD-10-CM | POA: Diagnosis not present

## 2022-12-19 DIAGNOSIS — I2510 Atherosclerotic heart disease of native coronary artery without angina pectoris: Secondary | ICD-10-CM | POA: Diagnosis not present

## 2022-12-19 DIAGNOSIS — L89023 Pressure ulcer of left elbow, stage 3: Secondary | ICD-10-CM | POA: Diagnosis not present

## 2022-12-19 DIAGNOSIS — Z09 Encounter for follow-up examination after completed treatment for conditions other than malignant neoplasm: Secondary | ICD-10-CM | POA: Insufficient documentation

## 2022-12-19 DIAGNOSIS — Z86718 Personal history of other venous thrombosis and embolism: Secondary | ICD-10-CM | POA: Insufficient documentation

## 2022-12-19 DIAGNOSIS — I4819 Other persistent atrial fibrillation: Secondary | ICD-10-CM | POA: Insufficient documentation

## 2022-12-20 DIAGNOSIS — Z7401 Bed confinement status: Secondary | ICD-10-CM | POA: Diagnosis not present

## 2022-12-20 DIAGNOSIS — Z7901 Long term (current) use of anticoagulants: Secondary | ICD-10-CM | POA: Diagnosis not present

## 2022-12-20 DIAGNOSIS — G894 Chronic pain syndrome: Secondary | ICD-10-CM | POA: Diagnosis not present

## 2022-12-20 DIAGNOSIS — K5909 Other constipation: Secondary | ICD-10-CM | POA: Diagnosis not present

## 2022-12-20 NOTE — Progress Notes (Signed)
Brian [name], Brian [name] ([id number]) [id number] Page 1 of 9 Visit Report for 12/19/2022 Chief Complaint Document Details Patient Name: Date of Service: [name] ID M. 12/19/2022 2:00 PM Medical Record Number: [id number] Patient Account Number: [id number] Date of Birth/Sex: Treating RN: 12-Jan-1933 (89 y.o. M) Primary Care Provider: Nadene Rubins Other Clinician: Referring Provider: Treating Provider/Extender: [name] in Treatment: 78 Information Obtained from: Patient Chief Complaint 08/05/2019; patient is here for review of pressure ulcers on his buttock 02/14/22: patient is here for review of pressure ulcers on his left elbow Electronic Signature(s) Signed: 12/19/2022 3:25:40 PM By: Duanne Guess MD FACS Entered By: Duanne Guess on 12/19/2022 15:25:39 -------------------------------------------------------------------------------- Debridement Details Patient Name: Date of Service: GO Hubert Azure, DA V ID M. 12/19/2022 2:00 PM Medical Record Number: [id number] Patient Account Number: [id number] Date of Birth/Sex: Treating RN: May 21, 1933 (89 y.o. [name] Primary Care Provider: Nadene Rubins Other Clinician: Referring Provider: Treating Provider/Extender: [name] in Treatment: 44 Debridement Performed for Assessment: Wound #1 Left Elbow Performed By: Physician Duanne Guess, MD Debridement Type: Debridement Level of Consciousness (Pre-procedure): Awake and Alert Pre-procedure Verification/Time Out Yes - 14:12 Taken: Start Time: 14:12 Pain Control: Lidocaine 4% T opical Solution Percent of Wound Bed Debrided: 100% T Area Debrided (cm): otal 2.98 Tissue and other material debrided: Non-Viable, Slough, Subcutaneous, Slough Level: Skin/Subcutaneous Tissue Debridement Description: Excisional Instrument: Curette Bleeding: Minimum Hemostasis Achieved: Pressure Response to  Treatment: Procedure was tolerated well Level of Consciousness (Post- Awake and Alert procedure): Post Debridement Measurements of Total Wound Length: (cm) 1.9 Stage: Category/Stage III Width: (cm) 2 Depth: (cm) 0.4 Volume: (cm) 1.194 Character of Wound/Ulcer Post Debridement: Improved Post Procedure Diagnosis Same as Pre-procedure Notes scribed for Dr. Lady Gary by Samuella Bruin, RN Electronic Signature(s) Signed: 12/19/2022 3:22:44 PM By: Samuella Bruin Signed: 12/19/2022 3:54:11 PM By: Duanne Guess MD FACS Zulauf, Sadarius M ([id number]) [phone].pdf Page 2 of 9 Entered By: Samuella Bruin on 12/19/2022 14:15:55 -------------------------------------------------------------------------------- HPI Details Patient Name: Date of Service: [name] ID M. 12/19/2022 2:00 PM Medical Record Number: [id number] Patient Account Number: [id number] Date of Birth/Sex: Treating RN: [birth year] (89 y.o. M) Primary Care Provider: Nadene Rubins Other Clinician: Referring Provider: Treating Provider/Extender: [name] in Treatment: 45 History of Present Illness HPI Description: ADMISSION 08/05/19 This is an 89 year old man who arrives in clinic accompanied by his son. 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 Apparently noted recently to have a stage I pressure area on the left buttock. He does not have a wound history. He does have been air fluidized mattress. They have a wheelchair cushion as well as a pillow over the top of this. He is not incontinent of urine or bowel. Past medical history includes hypertension, osteoarthritis of the knee, atrial fibrillation, history of mitral valve repair, right hemisphere CVA with left hemiparesis, history of a DVT READMISSION 02/14/2022 The patient is here today for evaluation of pressure ulcers on his left elbow and possible right buttock ulcer.  Apparently when he was seen by Dr. Leanord Hawking in 2020, no ulcer was appreciated on the buttock and no further wound care involvement occurred. 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 He does have home health aides and they are turning him every 2 hours side to side. He  is on a memory foam mattress at this time. 03/14/2022: The patient was scheduled to see me on July 11, but apparently his son panicked about some bright red blood per rectum and rushed him to the emergency room. There is also a comment in the electronic medical record that the patient's son thought the bone was exposed on his elbow. After waiting a prolonged period of time at the emergency department, they left without being seen. Today, it appears that the dressing on the patient's elbow was never changed since our last visit. The 2 wounds have converged creating a larger wound. According to the intake nurse, there was a foul odor from the wound and dressing. There is extensive slough on the wound surface. The bleeding per rectum turns out to be hemorrhoids. The patient's son expresses that he does not know how to manage this. 04/07/2022: For unclear reasons, the patient has not returned to clinic until they were contacted today and they made an add-on visit. The patient's son was not originally present at the beginning of the visit but showed up about 25 minutes into the visit. The patient was accompanied by a home aide. She reported that when she cleaned the patient up today, there was just a Band-Aid on his wound. She said the surface was fairly mucky but she was able to wash this off. It is abundantly clear that this wound is not being properly cared for. Apparently he did have [facility] home health but they have not been out in some time and it is not clear the reason for this. The surface of the wound is a bit  cleaner so perhaps the Santyl has been applied, but unfortunately there is now a deep tunnel that extends up from the elbow to the patient's posterior upper arm for a number centimeters. There is no evidence of infection or purulent drainage. It is also clear that the patient continues to spend a substantial portion of his time leaning on the elbow. 05/23/2022: Once again, the patient has failed to return to clinic for about 6 weeks and the reason remains unclear. He is apparently receiving home health assistance. The wound on his elbow is covered with hypertrophic granulation tissue. He also has a blister just distal to the wound on his posterior forearm. He has a new stage III pressure ulcer on his left gluteus with slough accumulation. He apparently spends a substantial portion of his days sitting in a wheelchair. I do not believe he has a Museum/gallery conservator. Due to his stroke, he lists to the left side, which is how his ulcer on his elbow developed. I suspect the gluteal ulcer is as a result of this, as well. 06/13/2022: The wound on his left gluteus is nearly healed. It is very superficial and quite clean. His son reports that he has just been applying Desitin to the area. The elbow wound is smaller but still has some hypertrophic granulation tissue and slough accumulation. No concern for infection at either site. 06/27/2022: The buttock wounds are healed. There is some tissue maceration without any significant skin breakdown. The elbow wound continues to contract. There is still some hypertrophic granulation tissue and slough buildup. 07/25/2022: The buttock wounds have reopened. The periwound skin has been kept very dry through liberal use of zinc oxide, however. The elbow wound dressing did not look like it had been changed in at least a week when it was removed. The underlying wound, however has not reaccumulated any hypertrophic granulation tissue; there  is a thin layer of slough present. No malodor  or purulent drainage from the wound itself. 08/15/2022: There is a tiny opening remaining on the buttocks. It is clean and there is no evidence of tissue maceration. The elbow looks very good. There is good beefy tissue present and it is flush with the surrounding skin. Home health has been coming out. 09/05/2022: The wound on his bottom is healed. The elbow shows signs of pressure induced deep tissue injury and also has some hypertrophic granulation tissue. 09/26/2022: The elbow looks much better today. There is a little bit of slough on the surface but there is no undermining or tunneling. He does have some hypertrophic granulation tissue accumulation. 10/17/2022: His elbow wound continues to improve. It is smaller, cleaner, and more superficial. There is hypertrophic granulation tissue present. 11/14/2022: His elbow ulcer is smaller again today. There is some slough on the surface. 12/19/2022: The pressure ulcer on his elbow is smaller again today. There is an area that looks like he may have bumped it on something, as it is a little bit bruised and purpleish. He is also requesting an order for physical therapy. Electronic Signature(s) Signed: 12/19/2022 3:28:09 PM By: Duanne Guess MD FACS Entered By: Duanne Guess on 12/19/2022 15:28:09 Fettig, Willey M ([id number]) [id number] Page 3 of 9 -------------------------------------------------------------------------------- Physical Exam Details Patient Name: Date of Service: [name] ID M. 12/19/2022 2:00 PM Medical Record Number: [id number] Patient Account Number: [id number] Date of Birth/Sex: Treating RN: 27-Apr-1933 (89 y.o. M) Primary Care Provider: Nadene Rubins Other Clinician: Referring Provider: Treating Provider/Extender: [name] in Treatment: 44 Constitutional . . . . no acute distress. Respiratory Normal work of breathing on room air. Notes 12/19/2022: The pressure  ulcer on his elbow is smaller again today. There is an area that looks like he may have bumped it on something, as it is a little bit bruised and purpleish. Electronic Signature(s) Signed: 12/19/2022 3:29:02 PM By: Duanne Guess MD FACS Entered By: Duanne Guess on 12/19/2022 15:29:01 -------------------------------------------------------------------------------- Physician Orders Details Patient Name: Date of Service: [name] ID M. 12/19/2022 2:00 PM Medical Record Number: [id number] Patient Account Number: [id number] Date of Birth/Sex: Treating RN: 1932/12/25 (89 y.o. [name] Primary Care Provider: Nadene Rubins Other Clinician: Referring Provider: Treating Provider/Extender: [name] in Treatment: 2 Verbal / Phone Orders: No Diagnosis Coding ICD-10 Coding Code Description L89.023 Pressure ulcer of left elbow, stage 3 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 I48.19 Other persistent atrial fibrillation I25.10 Atherosclerotic heart disease of native coronary artery without angina pectoris Follow-up Appointments Return appointment in 1 month. - Dr. Lady Gary - room 2 Anesthetic (In clinic) Topical Lidocaine 4% applied to wound bed Bathing/ Shower/ Hygiene May shower and wash wound with soap and water. - with dressing changes Off-Loading Turn and reposition every 2 hours - stay off of left elbow wear prevalon boot on elbow as directed Other: - limit time in wheelchair, reposition in wheelchair every 1-2 hours Home Health New wound care orders this week; continue Home Health for wound care. May utilize formulary equivalent dressing for wound treatment orders unless otherwise specified. - physical therapy once a week, wound care twice a week Other Home Health Orders/Instructions: - [id number] Wound Treatment Wound #1 - Elbow Wound Laterality: Left Cleanser: Soap and Water Every Other Day/30  Days Discharge Instructions: May shower and wash wound with dial antibacterial soap and water prior to dressing  change. Cleanser: Wound Cleanser (Generic) Every Other Day/30 Days Discharge Instructions: Cleanse the wound with wound cleanser prior to applying a clean dressing using gauze sponges, not tissue or cotton balls. JESAIAH, FABIANO ([id number]) [id number] Page 4 of 9 Prim Dressing: Hydrofera Blue Ready Transfer Foam, 4x5 (in/in) Every Other Day/30 Days ary Discharge Instructions: Apply to wound bed as instructed Secondary Dressing: ALLEVYN Gentle Border, 5x5 (in/in) (Generic) Every Other Day/30 Days Discharge Instructions: Apply over primary dressing as directed. Add-Ons: Cotton Tip Applicator, 6 (in) (Generic) Every Other Day/30 Days Patient Medications llergies: penicillin, Novocain A Notifications Medication Indication Start End 12/19/2022 lidocaine DOSE topical 4 % cream - cream topical Electronic Signature(s) Signed: 12/19/2022 3:54:11 PM By: Duanne Guess MD FACS Previous Signature: 12/19/2022 3:22:44 PM Version By: Samuella Bruin Entered By: Duanne Guess on 12/19/2022 15:29:35 -------------------------------------------------------------------------------- Problem List Details Patient Name: Date of Service: [name] ID M. 12/19/2022 2:00 PM Medical Record Number: [id number] Patient Account Number: [id number] Date of Birth/Sex: Treating RN: 03-04-1933 (89 y.o. M) Primary Care Provider: Nadene Rubins Other Clinician: Referring Provider: Treating Provider/Extender: [name] in Treatment: 47 Active Problems ICD-10 Encounter Code Description Active Date MDM Diagnosis L89.023 Pressure ulcer of left elbow, stage 3 02/14/2022 No Yes 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 02/14/2022 No Yes I48.19 Other persistent atrial fibrillation 02/14/2022 No Yes I25.10  Atherosclerotic heart disease of native coronary artery without angina pectoris 02/14/2022 No Yes Inactive Problems ICD-10 Code Description Active Date Inactive Date L89.323 Pressure ulcer of left buttock, stage 3 05/23/2022 05/23/2022 Resolved Problems Electronic Signature(s) Signed: 12/19/2022 3:22:16 PM By: Duanne Guess MD FACS Entered By: Duanne Guess on 12/19/2022 15:22:16 Laperle, Ishmail M ([id number]) [id number] Page 5 of 9 -------------------------------------------------------------------------------- Progress Note Details Patient Name: Date of Service: [name] ID M. 12/19/2022 2:00 PM Medical Record Number: [id number] Patient Account Number: [id number] Date of Birth/Sex: Treating RN: [birth year] (89 y.o. M) Primary Care Provider: Nadene Rubins Other Clinician: Referring Provider: Treating Provider/Extender: [name] in Treatment: 70 Subjective Chief Complaint Information obtained from Patient 08/05/2019; patient is here for review of pressure ulcers on his buttock 02/14/22: patient is here for review of pressure ulcers on his left elbow History of Present Illness (HPI) ADMISSION 08/05/19 This is an 89 year old man who arrives in clinic accompanied by his son. 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 Apparently noted recently to have a stage I pressure area on the left buttock. He does not have a wound history. He does have been air fluidized mattress. They have a wheelchair cushion as well as a pillow over the top of this. He is not incontinent of urine or bowel. Past medical history includes hypertension, osteoarthritis of the knee, atrial fibrillation, history of mitral valve repair, right hemisphere CVA with left hemiparesis, history of a DVT READMISSION 02/14/2022 The patient is here today for evaluation of pressure ulcers on his left elbow and possible right  buttock ulcer. Apparently when he was seen by Dr. Leanord Hawking in 2020, no ulcer was appreciated on the buttock and no further wound care involvement occurred. 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 He does have home health aides and they are turning him every 2 hours side to side. He is on a memory foam mattress at  this time. 03/14/2022: The patient was scheduled to see me on July 11, but apparently his son panicked about some bright red blood per rectum and rushed him to the emergency room. There is also a comment in the electronic medical record that the patient's son thought the bone was exposed on his elbow. After waiting a prolonged period of time at the emergency department, they left without being seen. Today, it appears that the dressing on the patient's elbow was never changed since our last visit. The 2 wounds have converged creating a larger wound. According to the intake nurse, there was a foul odor from the wound and dressing. There is extensive slough on the wound surface. The bleeding per rectum turns out to be hemorrhoids. The patient's son expresses that he does not know how to manage this. 04/07/2022: For unclear reasons, the patient has not returned to clinic until they were contacted today and they made an add-on visit. The patient's son was not originally present at the beginning of the visit but showed up about 25 minutes into the visit. The patient was accompanied by a home aide. She reported that when she cleaned the patient up today, there was just a Band-Aid on his wound. She said the surface was fairly mucky but she was able to wash this off. It is abundantly clear that this wound is not being properly cared for. Apparently he did have [facility] home health but they have not been out in some time and it is not clear the reason for this. The surface of the  wound is a bit cleaner so perhaps the Santyl has been applied, but unfortunately there is now a deep tunnel that extends up from the elbow to the patient's posterior upper arm for a number centimeters. There is no evidence of infection or purulent drainage. It is also clear that the patient continues to spend a substantial portion of his time leaning on the elbow. 05/23/2022: Once again, the patient has failed to return to clinic for about 6 weeks and the reason remains unclear. He is apparently receiving home health assistance. The wound on his elbow is covered with hypertrophic granulation tissue. He also has a blister just distal to the wound on his posterior forearm. He has a new stage III pressure ulcer on his left gluteus with slough accumulation. He apparently spends a substantial portion of his days sitting in a wheelchair. I do not believe he has a Museum/gallery conservator. Due to his stroke, he lists to the left side, which is how his ulcer on his elbow developed. I suspect the gluteal ulcer is as a result of this, as well. 06/13/2022: The wound on his left gluteus is nearly healed. It is very superficial and quite clean. His son reports that he has just been applying Desitin to the area. The elbow wound is smaller but still has some hypertrophic granulation tissue and slough accumulation. No concern for infection at either site. 06/27/2022: The buttock wounds are healed. There is some tissue maceration without any significant skin breakdown. The elbow wound continues to contract. There is still some hypertrophic granulation tissue and slough buildup. 07/25/2022: The buttock wounds have reopened. The periwound skin has been kept very dry through liberal use of zinc oxide, however. The elbow wound dressing did not look like it had been changed in at least a week when it was removed. The underlying wound, however has not reaccumulated any hypertrophic granulation tissue; there is a thin layer of slough  present. No malodor or purulent drainage from the wound itself. 08/15/2022: There is a tiny opening remaining on the buttocks. It is clean and there is no evidence of tissue maceration. The elbow looks very good. There is good beefy tissue present and it is flush with the surrounding skin. Home health has been coming out. 09/05/2022: The wound on his bottom is healed. The elbow shows signs of pressure induced deep tissue injury and also has some hypertrophic granulation tissue. 09/26/2022: The elbow looks much better today. There is a little bit of slough on the surface but there is no undermining or tunneling. He does have some hypertrophic granulation tissue accumulation. 10/17/2022: His elbow wound continues to improve. It is smaller, cleaner, and more superficial. There is hypertrophic granulation tissue present. 11/14/2022: His elbow ulcer is smaller again today. There is some slough on the surface. 12/19/2022: The pressure ulcer on his elbow is smaller again today. There is an area that looks like he may have bumped it on something, as it is a little bit bruised and purpleish. He is also requesting an order for physical therapy. Patient History Information obtained from Patient. Family History AMIN, FORNWALT Z ([id number]) [id number] Page 6 of 9 Diabetes - Maternal Grandparents, Heart Disease - Mother, Hypertension - Mother, No family history of Cancer, Hereditary Spherocytosis, Kidney Disease, Lung Disease, Seizures, Stroke, Thyroid Problems, Tuberculosis. Social History Never smoker, Marital Status - Married, Alcohol Use - Never, Drug Use - No History, Caffeine Use - Never. Medical History Eyes Patient has history of Cataracts - removed Cardiovascular Patient has history of Arrhythmia - Atrial Flutter/Atrial Fib, Coronary Artery Disease, Hypotension Denies history of Hypertension Musculoskeletal Patient has history of Osteoarthritis Neurologic Denies history  of Paraplegia Hospitalization/Surgery History - open heart surgery. - IR generic histoircal. - appendectomy. - hernia repair. - joint replacement. - mitral valve repair. - mvp repair. - total hip arthroplasty. Medical A Surgical History Notes nd Constitutional Symptoms (General Health) obseity Ear/Nose/Mouth/Throat wears hearing aids Cardiovascular Mitral Valve Repair, Genitourinary BPH Neurologic CVA 2018, left side hemiparesis Objective Constitutional no acute distress. Vitals Time Taken: 2:02 PM, Height: 66 in, Weight: 185 lbs, BMI: 29.9, Temperature: 97.9 F, Pulse: 71 bpm, Respiratory Rate: 16 breaths/min, Blood Pressure: 120/69 mmHg. Respiratory Normal work of breathing on room air. General Notes: 12/19/2022: The pressure ulcer on his elbow is smaller again today. There is an area that looks like he may have bumped it on something, as it is a little bit bruised and purpleish. Integumentary (Hair, Skin) Wound #1 status is Open. Original cause of wound was Pressure Injury. The date acquired was: 12/19/2021. The wound has been in treatment 44 weeks. The wound is located on the Left Elbow. The wound measures 1.9cm length x 2cm width x 0.4cm depth; 2.985cm^2 area and 1.194cm^3 volume. There is Fat Layer (Subcutaneous Tissue) exposed. There is no tunneling or undermining noted. There is a medium amount of serosanguineous drainage noted. The wound margin is distinct with the outline attached to the wound base. There is medium (34-66%) red, hyper - granulation within the wound bed. There is a medium (34-66%) amount of necrotic tissue within the wound bed including Adherent Slough. The periwound skin appearance had no abnormalities noted for texture. The periwound skin appearance had no abnormalities noted for moisture. The periwound skin appearance had no abnormalities noted for color. Periwound temperature was noted as No Abnormality. The periwound has tenderness on  palpation. Assessment Active Problems ICD-10 Pressure ulcer of left elbow, stage 3 Cerebral  infarction due to thrombosis of unspecified precerebral artery Other persistent atrial fibrillation Atherosclerotic heart disease of native coronary artery without angina pectoris Procedures Wound #1 [name] ([id number]) [id number] Page 7 of 9 Pre-procedure diagnosis of Wound #1 is a Pressure Ulcer located on the Left Elbow . There was a Excisional Skin/Subcutaneous Tissue Debridement with a total area of 2.98 sq cm performed by Duanne Guess, MD. With the following instrument(s): Curette to remove Non-Viable tissue/material. Material removed includes Subcutaneous Tissue and Slough and after achieving pain control using Lidocaine 4% T opical Solution. No specimens were taken. A time out was conducted at 14:12, prior to the start of the procedure. A Minimum amount of bleeding was controlled with Pressure. The procedure was tolerated well. Post Debridement Measurements: 1.9cm length x 2cm width x 0.4cm depth; 1.194cm^3 volume. Post debridement Stage noted as Category/Stage III. Character of Wound/Ulcer Post Debridement is improved. Post procedure Diagnosis Wound #1: Same as Pre-Procedure General Notes: scribed for Dr. Lady Gary by Samuella Bruin, RN. Plan Follow-up Appointments: Return appointment in 1 month. - Dr. Lady Gary - room 2 Anesthetic: (In clinic) Topical Lidocaine 4% applied to wound bed Bathing/ Shower/ Hygiene: May shower and wash wound with soap and water. - with dressing changes Off-Loading: Turn and reposition every 2 hours - stay off of left elbow wear prevalon boot on elbow as directed Other: - limit time in wheelchair, reposition in wheelchair every 1-2 hours Home Health: New wound care orders this week; continue Home Health for wound care. May utilize formulary equivalent dressing for wound treatment orders unless otherwise specified. -  physical therapy once a week, wound care twice a week Other Home Health Orders/Instructions: - [facility] The following medication(s) was prescribed: lidocaine topical 4 % cream cream topical was prescribed at facility WOUND #1: - Elbow Wound Laterality: Left Cleanser: Soap and Water Every Other Day/30 Days Discharge Instructions: May shower and wash wound with dial antibacterial soap and water prior to dressing change. Cleanser: Wound Cleanser (Generic) Every Other Day/30 Days Discharge Instructions: Cleanse the wound with wound cleanser prior to applying a clean dressing using gauze sponges, not tissue or cotton balls. Prim Dressing: Hydrofera Blue Ready Transfer Foam, 4x5 (in/in) Every Other Day/30 Days ary Discharge Instructions: Apply to wound bed as instructed Secondary Dressing: ALLEVYN Gentle Border, 5x5 (in/in) (Generic) Every Other Day/30 Days Discharge Instructions: Apply over primary dressing as directed. Add-Ons: Cotton Tip Applicator, 6 (in) (Generic) Every Other Day/30 Days 12/19/2022: The pressure ulcer on his elbow is smaller again today. There is an area that looks like he may have bumped it on something, as it is a little bit bruised and purpleish. I used a curette to debride slough and subcutaneous tissue from the wound. We will continue Hydrofera Blue with a foam heel cup. He should be using a Prevalon boot for additional offloading. They do have a new home health aide that seems to be very beneficial in facilitating his care. We will order the requested physical therapy treatment, as well. Follow-up in 1 month. Electronic Signature(s) Signed: 12/19/2022 3:31:11 PM By: Duanne Guess MD FACS Entered By: Duanne Guess on 12/19/2022 15:31:11 -------------------------------------------------------------------------------- HxROS Details Patient Name: Date of Service: GO RDO Dorris Carnes, DA V ID M. 12/19/2022 2:00 PM Medical Record Number: [id number] Patient Account Number:  [id number] Date of Birth/Sex: Treating RN: 10-10-1932 (89 y.o. M) Primary Care Provider: Nadene Rubins Other Clinician: Referring Provider: Treating Provider/Extender: [name] in Treatment: 44 Information Obtained From Patient Constitutional Symptoms (General Health)  Medical History: Past Medical History Notes: obseity Eyes Medical History: Positive for: Cataracts - removed ERVEY, FALLIN ([id number]) [id number] Page 8 of 9 Ear/Nose/Mouth/Throat Medical History: Past Medical History Notes: wears hearing aids Cardiovascular Medical History: Positive for: Arrhythmia - Atrial Flutter/Atrial Fib; Coronary Artery Disease; Hypotension Negative for: Hypertension Past Medical History Notes: Mitral Valve Repair, Genitourinary Medical History: Past Medical History Notes: BPH Musculoskeletal Medical History: Positive for: Osteoarthritis Neurologic Medical History: Negative for: Paraplegia Past Medical History Notes: CVA 2018, left side hemiparesis HBO Extended History Items Eyes: Cataracts Immunizations Pneumococcal Vaccine: Received Pneumococcal Vaccination: Yes Received Pneumococcal Vaccination On or After 60th Birthday: Yes Implantable Devices None Hospitalization / Surgery History Type of Hospitalization/Surgery open heart surgery IR generic histoircal appendectomy hernia repair joint replacement mitral valve repair mvp repair total hip arthroplasty Family and Social History Cancer: No; Diabetes: Yes - Maternal Grandparents; Heart Disease: Yes - Mother; Hereditary Spherocytosis: No; Hypertension: Yes - Mother; Kidney Disease: No; Lung Disease: No; Seizures: No; Stroke: No; Thyroid Problems: No; Tuberculosis: No; Never smoker; Marital Status - Married; Alcohol Use: Never; Drug Use: No History; Caffeine Use: Never; Financial Concerns: No; Food, Clothing or Shelter Needs: No; Support System Lacking:  No; Transportation Concerns: No Electronic Signature(s) Signed: 12/19/2022 3:54:11 PM By: Duanne Guess MD FACS Entered By: Duanne Guess on 12/19/2022 15:28:18 -------------------------------------------------------------------------------- SuperBill Details Patient Name: Date of Service: [name] ID M. 12/19/2022 Medical Record Number: [id number] Patient Account Number: [id number] Date of Birth/Sex: Treating RN: 11/03/1932 (89 y.o. M) Primary Care Provider: Nadene Rubins Other Clinician: Referring Provider: Treating Provider/Extender: [name] in Treatment: [address] [place] Judie Petit ([id number]) [phone].pdf Page 9 of 9 Diagnosis Coding ICD-10 Codes Code Description L89.023 Pressure ulcer of left elbow, stage 3 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 I48.19 Other persistent atrial fibrillation I25.10 Atherosclerotic heart disease of native coronary artery without angina pectoris Facility Procedures : CPT4 Code: [id number] Description: 11042 - DEB SUBQ TISSUE 20 SQ CM/< ICD-10 Diagnosis Description L89.023 Pressure ulcer of left elbow, stage 3 Modifier: Quantity: 1 Physician Procedures : CPT4 Code Description Modifier [id number] 99213 - WC PHYS LEVEL 3 - EST PT 25 ICD-10 Diagnosis Description L89.023 Pressure ulcer of left elbow, stage 3 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 I48.19 Other persistent  atrial fibrillation I25.10 Atherosclerotic heart disease of native coronary artery without angina pectoris Quantity: 1 : [id number] 11042 - WC PHYS SUBQ TISS 20 SQ CM ICD-10 Diagnosis Description L89.023 Pressure ulcer of left elbow, stage 3 Quantity: 1 Electronic Signature(s) Signed: 12/19/2022 3:31:58 PM By: Duanne Guess MD FACS Entered By: Duanne Guess on 12/19/2022 15:31:57

## 2022-12-20 NOTE — Progress Notes (Signed)
DANNELL, GORTNEY ([id number]) [id number] Page 1 of 7 Visit Report for 12/19/2022 Arrival Information Details Patient Name: Date of Service: Brian [name] Brian [name] 12/19/2022 2:00 PM Medical Record Number: [id number] Patient Account Number: [id number] Date of Birth/Sex: Treating RN: Jun 03, 1933 (89 y.o. Brian [name] Primary Care Brian [name]: Brian [name] Other Clinician: Referring Brian [name]: Treating Brian [name]/Extender: Brian [name] in Treatment: 52 Visit Information History Since Last Visit Added or deleted any medications: No Patient Arrived: Wheel Chair Any new allergies or adverse reactions: No Arrival Time: 14:00 Had a fall or experienced change in No Accompanied By: son activities of daily living that may affect Transfer Assistance: None risk of falls: Patient Identification Verified: Yes Signs or symptoms of abuse/neglect since last visito No Secondary Verification Process Completed: Yes Hospitalized since last visit: No Patient Requires Transmission-Based Precautions: No Implantable device outside of the clinic excluding No Patient Has Alerts: Yes cellular tissue based products placed in the center Patient Alerts: Patient on Blood Thinner since last visit: Has Dressing in Place as Prescribed: Yes Pain Present Now: Yes Electronic Signature(s) Signed: 12/19/2022 3:22:44 PM By: Samuella Bruin Entered By: Samuella Bruin on 12/19/2022 14:01:00 -------------------------------------------------------------------------------- Encounter Discharge Information Details Patient Name: Date of Service: Brian [name], DA V Brian [name] 12/19/2022 2:00 PM Medical Record Number: [id number] Patient Account Number: [id number] Date of Birth/Sex: Treating RN: December 02, 1932 (89 y.o. Brian [name] Primary Care Brian [name]: Brian [name] Other Clinician: Referring Brian [name]: Treating Brian [name]/Extender: Brian [name]  in Treatment: 77 Encounter Discharge Information Items Post Procedure Vitals Discharge Condition: Stable Temperature (F): 97.9 Ambulatory Status: Wheelchair Pulse (bpm): 71 Discharge Destination: Home Respiratory Rate (breaths/min): 16 Transportation: Private Auto Blood Pressure (mmHg): 120/69 Accompanied By: son Schedule Follow-up Appointment: Yes Clinical Summary of Care: Patient Declined Electronic Signature(s) Signed: 12/19/2022 3:22:44 PM By: Samuella Bruin Entered By: Samuella Bruin on 12/19/2022 14:24:25 -------------------------------------------------------------------------------- Lower Extremity Assessment Details Patient Name: Date of Service: Brian [name] Brian [name] 12/19/2022 2:00 PM Medical Record Number: [id number] Patient Account Number: [id number] Date of Birth/Sex: Treating RN: Nov 27, 1932 (89 y.o. Brian [name] Primary Care Brian [name]: Brian [name] Other Clinician: Referring Brian [name]: Treating Jayen Bromwell/Extender: Brian [name] in Treatment: 44 Electronic Signature(s) Signed: 12/19/2022 3:22:44 PM By: Brian [name], Brian [name] ([id number]) [id number] Page 2 of 7 Entered By: Samuella Bruin on 12/19/2022 14:02:11 -------------------------------------------------------------------------------- Multi Wound Chart Details Patient Name: Date of Service: Brian [name] Brian [name] 12/19/2022 2:00 PM Medical Record Number: [id number] Patient Account Number: [id number] Date of Birth/Sex: Treating RN: [birth year] (89 y.o. [name]) Primary Care Marisel Tostenson: Brian [name] Other Clinician: Referring Brian [name]: Treating Brian [name]/Extender: Brian [name] in Treatment: 44 Vital Signs Height(in): 66 Pulse(bpm): 71 Weight(lbs): 185 Blood Pressure(mmHg): 120/69 Body Mass Index(BMI): 29.9 Temperature(F): 97.9 Respiratory Rate(breaths/min): 16 [1:Photos:] [N/A:N/A] Left Elbow N/A  N/A Wound Location: Pressure Injury N/A N/A Wounding Event: Pressure Ulcer N/A N/A Primary Etiology: Cataracts, Arrhythmia, Coronary N/A N/A Comorbid History: Artery Disease, Hypotension, Osteoarthritis 12/19/2021 N/A N/A Date Acquired: 37 N/A N/A Weeks of Treatment: Open N/A N/A Wound Status: No N/A N/A Wound Recurrence: Yes N/A N/A Clustered Wound: 1.9x2x0.4 N/A N/A Measurements L x W x D (cm) 2.985 N/A N/A A (cm) : rea 1.194 N/A N/A Volume (cm) : 69.00% N/A N/A % Reduction in A rea: -24.10% N/A N/A % Reduction in Volume: Category/Stage III N/A N/A Classification: Medium N/A N/A Exudate A mount: Serosanguineous N/A N/A Exudate Type: red, brown N/A N/A Exudate  Color: Distinct, outline attached N/A N/A Wound Margin: Medium (34-66%) N/A N/A Granulation A mount: Red, Hyper-granulation N/A N/A Granulation Quality: Medium (34-66%) N/A N/A Necrotic A mount: Fat Layer (Subcutaneous Tissue): Yes N/A N/A Exposed Structures: Fascia: No Tendon: No Muscle: No Joint: No Bone: No Medium (34-66%) N/A N/A Epithelialization: Debridement - Excisional N/A N/A Debridement: Pre-procedure Verification/Time Out 14:12 N/A N/A Taken: Lidocaine 4% Topical Solution N/A N/A Pain Control: Subcutaneous, Slough N/A N/A Tissue Debrided: Skin/Subcutaneous Tissue N/A N/A Level: 2.98 N/A N/A Debridement A (sq cm): rea Curette N/A N/A Instrument: Minimum N/A N/A Bleeding: Pressure N/A N/A Hemostasis A chieved: Procedure was tolerated well N/A N/A Debridement Treatment Response: 1.9x2x0.4 N/A N/A Post Debridement Measurements L x W x D (cm) 1.194 N/A N/A Post Debridement Volume: (cm) Category/Stage III N/A N/A Post Debridement Stage: No Abnormalities Noted N/A N/A Periwound Skin Texture: No Abnormalities Noted N/A N/A Periwound Skin Moisture: No Abnormalities Noted N/A N/A Periwound Skin Color: Brian [name] ([id number]) [id number]  Page 3 of 7 No Abnormality N/A N/A Temperature: Yes N/A N/A Tenderness on Palpation: Debridement N/A N/A Procedures Performed: Treatment Notes Wound #1 (Elbow) Wound Laterality: Left Cleanser Soap and Water Discharge Instruction: May shower and wash wound with dial antibacterial soap and water prior to dressing change. Wound Cleanser Discharge Instruction: Cleanse the wound with wound cleanser prior to applying a clean dressing using gauze sponges, not tissue or cotton balls. Peri-Wound Care Topical Primary Dressing Hydrofera Blue Ready Transfer Foam, 4x5 (in/in) Discharge Instruction: Apply to wound bed as instructed Secondary Dressing ALLEVYN Gentle Border, 5x5 (in/in) Discharge Instruction: Apply over primary dressing as directed. Secured With Compression Wrap Compression Stockings Add-Ons Engineer, maintenance, 6 (in) [employer]) Signed: 12/19/2022 3:23:16 PM By: Duanne Guess MD FACS Entered By: Duanne Guess on 12/19/2022 15:23:15 -------------------------------------------------------------------------------- Multi-Disciplinary Care Plan Details Patient Name: Date of Service: Brian Brian [name] Brian [name] 12/19/2022 2:00 PM Medical Record Number: [id number] Patient Account Number: [id number] Date of Birth/Sex: Treating RN: 01-29-1933 (89 y.o. Brian [name] Primary Care Jaleia Hanke: Brian [name] Other Clinician: Referring Ericia Moxley: Treating Aristotelis Vilardi/Extender: Brian [name] in Treatment: 40 Multidisciplinary Care Plan reviewed with physician Active Inactive Abuse / Safety / Falls / Self Care Management Nursing Diagnoses: Impaired physical mobility Potential for falls Goals: Patient/caregiver will verbalize understanding of skin care regimen Date Initiated: 02/14/2022 Target Resolution Date: 02/17/2023 Goal Status: Active Patient/caregiver will verbalize/demonstrate measures taken to prevent injury and/or falls Date  Initiated: 02/14/2022 Target Resolution Date: 02/17/2023 Goal Status: Active Interventions: Assess fall risk on admission and as needed Assess self care needs on admission and as needed Notes: Brian [name], Brian [name] ([id number]) [phone].pdf Page 4 of 7 Wound/Skin Impairment Nursing Diagnoses: Impaired tissue integrity Knowledge deficit related to ulceration/compromised skin integrity Goals: Patient/caregiver will verbalize understanding of skin care regimen Date Initiated: 02/14/2022 Target Resolution Date: 02/17/2023 Goal Status: Active Ulcer/skin breakdown will have a volume reduction of 30% by week 4 Date Initiated: 02/14/2022 Date Inactivated: 05/23/2022 Target Resolution Date: 05/05/2022 Goal Status: Unmet Unmet Reason: new PU left glut Interventions: Assess patient/caregiver ability to obtain necessary supplies Assess patient/caregiver ability to perform ulcer/skin care regimen upon admission and as needed Assess ulceration(s) every visit Treatment Activities: Skin care regimen initiated : 02/14/2022 Topical wound management initiated : 02/14/2022 Notes: Electronic Signature(s) Signed: 12/19/2022 3:22:44 PM By: Samuella Bruin Entered By: Samuella Bruin on 12/19/2022 14:14:10 -------------------------------------------------------------------------------- Pain Assessment Details Patient Name: Date of Service: Brian [name], DA V Brian [name] 12/19/2022 2:00 PM Medical  Record Number: [id number] Patient Account Number: [id number] Date of Birth/Sex: Treating RN: October 29, 1932 (89 y.o. Brian [name] Primary Care Brylei Pedley: Brian [name] Other Clinician: Referring Keiyon Plack: Treating Nalany Steedley/Extender: Brian [name] in Treatment: 66 Active Problems Location of Pain Severity and Description of Pain Patient Has Paino Yes Site Locations Pain Location: Pain in Ulcers With Dressing Change: Yes Duration of the Pain. Constant /  Intermittento Constant Rate the pain. Current Pain Level: 5 Character of Pain Describe the Pain: Tender Pain Management and Medication Current Pain Management: Electronic Signature(s) Signed: 12/19/2022 3:22:44 PM By: Samuella Bruin Brian [name],Signed: 12/19/2022 3:22:44 PM By: Alcus Dad ([id number]) [id number] Page 5 of 7 aylor Entered By: Samuella Bruin on 12/19/2022 14:01:19 -------------------------------------------------------------------------------- Patient/Caregiver Education Details Patient Name: Date of Service: Brian Brian [name] Brian [name] 4/30/2024andnbsp2:00 PM Medical Record Number: [id number] Patient Account Number: [id number] Date of Birth/Gender: Treating RN: [birth year] (89 y.o. Brian [name] Primary Care Physician: Brian [name] Other Clinician: Referring Physician: Treating Physician/Extender: Brian [name] in Treatment: 51 Education Assessment Education Provided To: Patient Education Topics Provided Safety: Methods: Explain/Verbal Responses: Reinforcements needed, State content correctly Electronic Signature(s) Signed: 12/19/2022 3:22:44 PM By: Samuella Bruin Entered By: Samuella Bruin on 12/19/2022 14:14:37 -------------------------------------------------------------------------------- Wound Assessment Details Patient Name: Date of Service: Brian [name] Brian [name] 12/19/2022 2:00 PM Medical Record Number: [id number] Patient Account Number: [id number] Date of Birth/Sex: Treating RN: 09-16-1932 (89 y.o. Brian [name] Primary Care Wiley Magan: Brian [name] Other Clinician: Referring Penny Frisbie: Treating Neziah Braley/Extender: Brian [name] in Treatment: 44 Wound Status Wound Number: 1 Primary Pressure Ulcer Etiology: Wound Location: Left Elbow Wound Status: Open Wounding Event: Pressure Injury Comorbid Cataracts, Arrhythmia, Coronary Artery  Disease, Date Acquired: 12/19/2021 History: Hypotension, Osteoarthritis Weeks Of Treatment: 44 Clustered Wound: Yes Photos Wound Measurements Length: (cm) Width: (cm) Depth: (cm) Area: (cm) Volume: (cm) 1.9 % Reduction in Area: 69% 2 % Reduction in Volume: -24.1% 0.4 Epithelialization: Medium (34-66%) 2.985 Tunneling: No 1.194 Undermining: No Wound Description Brian [name], Brian [name] ([id number]) Classification: Category/Stage III Wound Margin: Distinct, outline attached Exudate Amount: Medium Exudate Type: Serosanguineous Exudate Color: red, brown [id number] Page 6 of 7 Foul Odor After Cleansing: No Slough/Fibrino Yes Wound Bed Granulation Amount: Medium (34-66%) Exposed Structure Granulation Quality: Red, Hyper-granulation Fascia Exposed: No Necrotic Amount: Medium (34-66%) Fat Layer (Subcutaneous Tissue) Exposed: Yes Necrotic Quality: Adherent Slough Tendon Exposed: No Muscle Exposed: No Joint Exposed: No Bone Exposed: No Periwound Skin Texture Texture Color No Abnormalities Noted: Yes No Abnormalities Noted: Yes Moisture Temperature / Pain No Abnormalities Noted: Yes Temperature: No Abnormality Tenderness on Palpation: Yes Treatment Notes Wound #1 (Elbow) Wound Laterality: Left Cleanser Soap and Water Discharge Instruction: May shower and wash wound with dial antibacterial soap and water prior to dressing change. Wound Cleanser Discharge Instruction: Cleanse the wound with wound cleanser prior to applying a clean dressing using gauze sponges, not tissue or cotton balls. Peri-Wound Care Topical Primary Dressing Hydrofera Blue Ready Transfer Foam, 4x5 (in/in) Discharge Instruction: Apply to wound bed as instructed Secondary Dressing ALLEVYN Gentle Border, 5x5 (in/in) Discharge Instruction: Apply over primary dressing as directed. Secured With Compression Wrap Compression Stockings Add-Ons Engineer, maintenance, 6 (in) Electronic  Signature(s) Signed: 12/19/2022 3:22:44 PM By: Samuella Bruin Entered By: Samuella Bruin on 12/19/2022 14:07:24 -------------------------------------------------------------------------------- Vitals Details Patient Name: Date of Service: Brian [name], DA V Brian [name] 12/19/2022 2:00 PM Medical Record Number: [id number] Patient Account Number: [id number] Date of Birth/Sex: Treating RN: Dec 26, 1932 (  89 y.o. Brian [name] Primary Care Elihu Milstein: Brian [name] Other Clinician: Referring Briannon Boggio: Treating Georgeanna Radziewicz/Extender: Brian [name] in Treatment: 44 Vital Signs Time Taken: 14:02 Temperature (F): 97.9 Height (in): 66 Pulse (bpm): 71 Weight (lbs): 185 Respiratory Rate (breaths/min): 16 Body Mass Index (BMI): 29.9 Blood Pressure (mmHg): 120/69 Brian [name], Brian [name] ([id number]) [id number] Page 7 of 7 Reference Range: 80 - 120 mg / dl Electronic Signature(s) Signed: 12/19/2022 3:22:44 PM By: Samuella Bruin Entered By: Samuella Bruin on 12/19/2022 14:02:28

## 2022-12-23 DIAGNOSIS — I4819 Other persistent atrial fibrillation: Secondary | ICD-10-CM | POA: Diagnosis not present

## 2022-12-23 DIAGNOSIS — Z7901 Long term (current) use of anticoagulants: Secondary | ICD-10-CM | POA: Diagnosis not present

## 2022-12-23 DIAGNOSIS — I2510 Atherosclerotic heart disease of native coronary artery without angina pectoris: Secondary | ICD-10-CM | POA: Diagnosis not present

## 2022-12-23 DIAGNOSIS — Z86718 Personal history of other venous thrombosis and embolism: Secondary | ICD-10-CM | POA: Diagnosis not present

## 2022-12-23 DIAGNOSIS — Z952 Presence of prosthetic heart valve: Secondary | ICD-10-CM | POA: Diagnosis not present

## 2022-12-23 DIAGNOSIS — I69354 Hemiplegia and hemiparesis following cerebral infarction affecting left non-dominant side: Secondary | ICD-10-CM | POA: Diagnosis not present

## 2022-12-23 DIAGNOSIS — Z79891 Long term (current) use of opiate analgesic: Secondary | ICD-10-CM | POA: Diagnosis not present

## 2022-12-23 DIAGNOSIS — L89023 Pressure ulcer of left elbow, stage 3: Secondary | ICD-10-CM | POA: Diagnosis not present

## 2022-12-23 DIAGNOSIS — I119 Hypertensive heart disease without heart failure: Secondary | ICD-10-CM | POA: Diagnosis not present

## 2022-12-23 DIAGNOSIS — M179 Osteoarthritis of knee, unspecified: Secondary | ICD-10-CM | POA: Diagnosis not present

## 2022-12-25 ENCOUNTER — Telehealth: Payer: Self-pay

## 2022-12-25 DIAGNOSIS — L89023 Pressure ulcer of left elbow, stage 3: Secondary | ICD-10-CM | POA: Diagnosis not present

## 2022-12-25 DIAGNOSIS — Z7901 Long term (current) use of anticoagulants: Secondary | ICD-10-CM | POA: Diagnosis not present

## 2022-12-25 DIAGNOSIS — I69354 Hemiplegia and hemiparesis following cerebral infarction affecting left non-dominant side: Secondary | ICD-10-CM | POA: Diagnosis not present

## 2022-12-25 DIAGNOSIS — I2510 Atherosclerotic heart disease of native coronary artery without angina pectoris: Secondary | ICD-10-CM | POA: Diagnosis not present

## 2022-12-25 DIAGNOSIS — I119 Hypertensive heart disease without heart failure: Secondary | ICD-10-CM | POA: Diagnosis not present

## 2022-12-25 DIAGNOSIS — I4819 Other persistent atrial fibrillation: Secondary | ICD-10-CM | POA: Diagnosis not present

## 2022-12-25 NOTE — Progress Notes (Signed)
Care Management & Coordination Services Pharmacy Team  Reason for Encounter: Hypertension  Contacted patient to discuss hypertension disease state.  Spoke with caregiver on 12/25/2022     Current antihypertensive regimen:  Metoprolol XL 25 mg daily   Patient verbally confirms he is taking the above medications as directed. Yes  How often are you checking your Blood Pressure? daily  he checks his blood pressure at nighttime after taking his medication.  Current home BP readings:  Patient caregiver states patient blood pressure has been ranging around 116/74.   Wrist or arm cuff: Patient caregiver reports patient use a arm cuff.  Caffeine intake: None ID  Salt intake: Patient caregiver states patient does not have any salt added to his meals.   Any readings above 180/100? No  What recent interventions/DTPs have been made by any provider to improve Blood Pressure control since last CPP Visit: None ID  Any recent hospitalizations or ED visits since last visit with CPP? No  What diet changes have been made to improve Blood Pressure Control?  None ID  What exercise is being done to improve your Blood Pressure Control?  None ID  Adherence Review: Is the patient currently on ACE/ARB medication? No Does the patient have >5 day gap between last estimated fill dates? No  Care Gaps: Shingrix Vaccine AWV  Star Rating Drugs:  Medication:    Pravastatin 40 mg Last Fill: 10/26/2022 90 Day Supply at [facility].    Chart Updates: Recent office visits:  None ID  Recent consult visits:  12/19/2022  Dr. Lady Gary MD (Wound) No medication changes noted 12/05/2022 Dr. Cherylann Ratel MD (Pain Medicine) No medication changes noted 11/14/2022 Dr. Lady Gary MD (Wound) No medication changes noted 09/27/2022 Dr. Lady Gary MD (Wound) No medication changes noted  Hospital visits:  None in previous 6 months  Medications: Outpatient Encounter Medications as of 12/25/2022  Medication Sig    ALPRAZolam (XANAX) 0.25 MG tablet Take 0.25 mg by mouth 2 (two) times daily as needed for anxiety.   cetirizine (ZYRTEC) 10 MG tablet TAKE 1 TABLET(10 MG) BY MOUTH AT BEDTIME   ELIQUIS 2.5 MG TABS tablet TAKE 1 TABLET(2.5 MG) BY MOUTH TWICE DAILY   finasteride (PROSCAR) 5 MG tablet TAKE 1 TABLET(5 MG) BY MOUTH DAILY   fluticasone (FLONASE) 50 MCG/ACT nasal spray Place 1 spray into both nostrils daily as needed for allergies.   hydrocortisone ointment 0.5 % Apply 1 Application topically daily as needed for itching.   melatonin 5 MG TABS Take 5 mg by mouth at bedtime as needed (sleep).   metoprolol succinate (TOPROL-XL) 25 MG 24 hr tablet TAKE 1 TABLET(25 MG) BY MOUTH DAILY (Patient taking differently: Take 25 mg by mouth daily.)   Multiple Vitamin (MULTIVITAMIN) tablet Take 1 tablet by mouth 2 (two) times daily.   MUPIROCIN EX Apply 1 Application topically daily as needed (wound care).   [START ON 01/03/2023] oxyCODONE-acetaminophen (PERCOCET) 7.5-325 MG tablet Take 1 tablet by mouth every 12 (twelve) hours as needed for severe pain.   [START ON 02/02/2023] oxyCODONE-acetaminophen (PERCOCET) 7.5-325 MG tablet Take 1 tablet by mouth every 12 (twelve) hours as needed for severe pain.   [START ON 03/04/2023] oxyCODONE-acetaminophen (PERCOCET) 7.5-325 MG tablet Take 1 tablet by mouth every 12 (twelve) hours as needed for severe pain.   pantoprazole (PROTONIX) 40 MG tablet TAKE 1 TABLET(40 MG) BY MOUTH DAILY   polyethylene glycol powder (GLYCOLAX/MIRALAX) 17 GM/SCOOP powder Mix one scoop with water and take daily until stools loosen. May repeat  as needed. (Patient taking differently: Take 17 g by mouth daily as needed for mild constipation.)   pravastatin (PRAVACHOL) 40 MG tablet TAKE 1 TABLET(40 MG) BY MOUTH DAILY   pregabalin (LYRICA) 25 MG capsule Take 1 capsule (25 mg total) by mouth at bedtime.   QUEtiapine (SEROQUEL) 50 MG tablet TAKE 1 TABLET(50 MG) BY MOUTH AT BEDTIME   sertraline (ZOLOFT) 50 MG  tablet TAKE 1 TABLET(50 MG) BY MOUTH EVERY MORNING   tamsulosin (FLOMAX) 0.4 MG CAPS capsule TAKE 1 CAPSULE(0.4 MG) BY MOUTH DAILY   No facility-administered encounter medications on file as of 12/25/2022.    Recent Office Vitals: BP Readings from Last 3 Encounters:  12/05/22 (!) 117/59  09/12/22 122/62  06/06/22 (!) 123/46   Pulse Readings from Last 3 Encounters:  12/05/22 76  09/12/22 (!) 53  06/06/22 61    Wt Readings from Last 3 Encounters:  09/12/22 185 lb (83.9 kg)  06/06/22 185 lb (83.9 kg)  04/11/22 150 lb (68 kg)     Kidney Function Lab Results  Component Value Date/Time   CREATININE 0.98 03/14/2022 07:04 PM   CREATININE 0.68 02/28/2022 05:04 PM   CREATININE 0.63 (L) 02/06/2020 05:06 PM   CREATININE 0.64 (L) 10/03/2016 11:57 AM   GFR 84.36 11/01/2021 03:28 PM   GFRNONAA >60 03/14/2022 07:04 PM   GFRNONAA >60 01/23/2011 09:20 AM   GFRAA >60 11/03/2018 07:35 PM   GFRAA >60 01/23/2011 09:20 AM       Latest Ref Rng & Units 03/14/2022    7:04 PM 02/28/2022    5:04 PM 11/01/2021    3:28 PM  BMP  Glucose 70 - 99 mg/dL 147  95  829   BUN 8 - 23 mg/dL 18  15  15    Creatinine 0.61 - 1.24 mg/dL 5.62  1.30  8.65   Sodium 135 - 145 mmol/L 137  139  139   Potassium 3.5 - 5.1 mmol/L 3.7  4.5  4.3   Chloride 98 - 111 mmol/L 104  104  105   CO2 22 - 32 mmol/L 26  25  30    Calcium 8.9 - 10.3 mg/dL 8.8  9.0  9.0      [facility] Clinical Pharmacist Assistant [phone]

## 2022-12-28 DIAGNOSIS — L89023 Pressure ulcer of left elbow, stage 3: Secondary | ICD-10-CM | POA: Diagnosis not present

## 2022-12-28 DIAGNOSIS — I4819 Other persistent atrial fibrillation: Secondary | ICD-10-CM | POA: Diagnosis not present

## 2022-12-28 DIAGNOSIS — I69354 Hemiplegia and hemiparesis following cerebral infarction affecting left non-dominant side: Secondary | ICD-10-CM | POA: Diagnosis not present

## 2022-12-28 DIAGNOSIS — I2510 Atherosclerotic heart disease of native coronary artery without angina pectoris: Secondary | ICD-10-CM | POA: Diagnosis not present

## 2022-12-28 DIAGNOSIS — I119 Hypertensive heart disease without heart failure: Secondary | ICD-10-CM | POA: Diagnosis not present

## 2022-12-28 DIAGNOSIS — Z7901 Long term (current) use of anticoagulants: Secondary | ICD-10-CM | POA: Diagnosis not present

## 2023-01-02 DIAGNOSIS — Z7901 Long term (current) use of anticoagulants: Secondary | ICD-10-CM | POA: Diagnosis not present

## 2023-01-02 DIAGNOSIS — I69354 Hemiplegia and hemiparesis following cerebral infarction affecting left non-dominant side: Secondary | ICD-10-CM | POA: Diagnosis not present

## 2023-01-02 DIAGNOSIS — L89023 Pressure ulcer of left elbow, stage 3: Secondary | ICD-10-CM | POA: Diagnosis not present

## 2023-01-02 DIAGNOSIS — I119 Hypertensive heart disease without heart failure: Secondary | ICD-10-CM | POA: Diagnosis not present

## 2023-01-02 DIAGNOSIS — I2510 Atherosclerotic heart disease of native coronary artery without angina pectoris: Secondary | ICD-10-CM | POA: Diagnosis not present

## 2023-01-02 DIAGNOSIS — I4819 Other persistent atrial fibrillation: Secondary | ICD-10-CM | POA: Diagnosis not present

## 2023-01-04 DIAGNOSIS — I4819 Other persistent atrial fibrillation: Secondary | ICD-10-CM | POA: Diagnosis not present

## 2023-01-04 DIAGNOSIS — L89023 Pressure ulcer of left elbow, stage 3: Secondary | ICD-10-CM | POA: Diagnosis not present

## 2023-01-04 DIAGNOSIS — I2510 Atherosclerotic heart disease of native coronary artery without angina pectoris: Secondary | ICD-10-CM | POA: Diagnosis not present

## 2023-01-04 DIAGNOSIS — I69354 Hemiplegia and hemiparesis following cerebral infarction affecting left non-dominant side: Secondary | ICD-10-CM | POA: Diagnosis not present

## 2023-01-04 DIAGNOSIS — Z7901 Long term (current) use of anticoagulants: Secondary | ICD-10-CM | POA: Diagnosis not present

## 2023-01-04 DIAGNOSIS — I119 Hypertensive heart disease without heart failure: Secondary | ICD-10-CM | POA: Diagnosis not present

## 2023-01-05 ENCOUNTER — Telehealth: Payer: Self-pay | Admitting: Family Medicine

## 2023-01-05 NOTE — Telephone Encounter (Signed)
Brian [name] from [facility] [phone]  Called to report that the pt said his back was very itchy and asked for some medication. She asked to please advise pt.

## 2023-01-08 DIAGNOSIS — I4819 Other persistent atrial fibrillation: Secondary | ICD-10-CM | POA: Diagnosis not present

## 2023-01-08 DIAGNOSIS — I2510 Atherosclerotic heart disease of native coronary artery without angina pectoris: Secondary | ICD-10-CM | POA: Diagnosis not present

## 2023-01-08 DIAGNOSIS — L89023 Pressure ulcer of left elbow, stage 3: Secondary | ICD-10-CM | POA: Diagnosis not present

## 2023-01-08 DIAGNOSIS — Z7901 Long term (current) use of anticoagulants: Secondary | ICD-10-CM | POA: Diagnosis not present

## 2023-01-08 DIAGNOSIS — I69354 Hemiplegia and hemiparesis following cerebral infarction affecting left non-dominant side: Secondary | ICD-10-CM | POA: Diagnosis not present

## 2023-01-08 DIAGNOSIS — I119 Hypertensive heart disease without heart failure: Secondary | ICD-10-CM | POA: Diagnosis not present

## 2023-01-08 NOTE — Telephone Encounter (Signed)
FYI message below.

## 2023-01-08 NOTE — Telephone Encounter (Signed)
Malorie is calling from [facility] wanting Dr Doreene Burke to know pt has ringworm. He is taking OTC Tinactin. If you have any questions Malorie at [phone].

## 2023-01-09 DIAGNOSIS — R300 Dysuria: Secondary | ICD-10-CM | POA: Diagnosis not present

## 2023-01-09 DIAGNOSIS — N3020 Other chronic cystitis without hematuria: Secondary | ICD-10-CM | POA: Diagnosis not present

## 2023-01-11 DIAGNOSIS — I2510 Atherosclerotic heart disease of native coronary artery without angina pectoris: Secondary | ICD-10-CM | POA: Diagnosis not present

## 2023-01-11 DIAGNOSIS — I119 Hypertensive heart disease without heart failure: Secondary | ICD-10-CM | POA: Diagnosis not present

## 2023-01-11 DIAGNOSIS — I4819 Other persistent atrial fibrillation: Secondary | ICD-10-CM | POA: Diagnosis not present

## 2023-01-11 DIAGNOSIS — I69354 Hemiplegia and hemiparesis following cerebral infarction affecting left non-dominant side: Secondary | ICD-10-CM | POA: Diagnosis not present

## 2023-01-11 DIAGNOSIS — L89023 Pressure ulcer of left elbow, stage 3: Secondary | ICD-10-CM | POA: Diagnosis not present

## 2023-01-11 DIAGNOSIS — Z7901 Long term (current) use of anticoagulants: Secondary | ICD-10-CM | POA: Diagnosis not present

## 2023-01-16 ENCOUNTER — Encounter (HOSPITAL_BASED_OUTPATIENT_CLINIC_OR_DEPARTMENT_OTHER): Payer: Medicare Other | Attending: General Surgery | Admitting: General Surgery

## 2023-01-16 DIAGNOSIS — Z86718 Personal history of other venous thrombosis and embolism: Secondary | ICD-10-CM | POA: Diagnosis not present

## 2023-01-16 DIAGNOSIS — Z7901 Long term (current) use of anticoagulants: Secondary | ICD-10-CM | POA: Diagnosis not present

## 2023-01-16 DIAGNOSIS — I69354 Hemiplegia and hemiparesis following cerebral infarction affecting left non-dominant side: Secondary | ICD-10-CM | POA: Insufficient documentation

## 2023-01-16 DIAGNOSIS — I10 Essential (primary) hypertension: Secondary | ICD-10-CM | POA: Diagnosis not present

## 2023-01-16 DIAGNOSIS — I4819 Other persistent atrial fibrillation: Secondary | ICD-10-CM | POA: Insufficient documentation

## 2023-01-16 DIAGNOSIS — I2510 Atherosclerotic heart disease of native coronary artery without angina pectoris: Secondary | ICD-10-CM | POA: Insufficient documentation

## 2023-01-16 DIAGNOSIS — L89023 Pressure ulcer of left elbow, stage 3: Secondary | ICD-10-CM | POA: Diagnosis not present

## 2023-01-16 DIAGNOSIS — Z8249 Family history of ischemic heart disease and other diseases of the circulatory system: Secondary | ICD-10-CM | POA: Insufficient documentation

## 2023-01-16 DIAGNOSIS — I119 Hypertensive heart disease without heart failure: Secondary | ICD-10-CM | POA: Diagnosis not present

## 2023-01-16 NOTE — Telephone Encounter (Signed)
Please advise message below okay to give verbal orders for speech  eval?

## 2023-01-16 NOTE — Telephone Encounter (Signed)
Brian [name] from [facility] [phone] calling to report that the pt is having trouble swallowing and would like VO to do a speech evaluation.   Also he has added OTC Lotimin to his treatment for ringworm.

## 2023-01-16 NOTE — Addendum Note (Signed)
Addended by: Lake Bells on: 01/16/2023 03:09 PM   Modules accepted: Orders

## 2023-01-17 NOTE — Telephone Encounter (Signed)
Verbal given, written will be faxed

## 2023-01-19 DIAGNOSIS — Z7901 Long term (current) use of anticoagulants: Secondary | ICD-10-CM | POA: Diagnosis not present

## 2023-01-19 DIAGNOSIS — L89023 Pressure ulcer of left elbow, stage 3: Secondary | ICD-10-CM | POA: Diagnosis not present

## 2023-01-19 DIAGNOSIS — I69354 Hemiplegia and hemiparesis following cerebral infarction affecting left non-dominant side: Secondary | ICD-10-CM | POA: Diagnosis not present

## 2023-01-19 DIAGNOSIS — I4819 Other persistent atrial fibrillation: Secondary | ICD-10-CM | POA: Diagnosis not present

## 2023-01-19 DIAGNOSIS — I119 Hypertensive heart disease without heart failure: Secondary | ICD-10-CM | POA: Diagnosis not present

## 2023-01-19 DIAGNOSIS — I2510 Atherosclerotic heart disease of native coronary artery without angina pectoris: Secondary | ICD-10-CM | POA: Diagnosis not present

## 2023-01-22 DIAGNOSIS — Z86718 Personal history of other venous thrombosis and embolism: Secondary | ICD-10-CM | POA: Diagnosis not present

## 2023-01-22 DIAGNOSIS — Z79891 Long term (current) use of opiate analgesic: Secondary | ICD-10-CM | POA: Diagnosis not present

## 2023-01-22 DIAGNOSIS — I4819 Other persistent atrial fibrillation: Secondary | ICD-10-CM | POA: Diagnosis not present

## 2023-01-22 DIAGNOSIS — I2510 Atherosclerotic heart disease of native coronary artery without angina pectoris: Secondary | ICD-10-CM | POA: Diagnosis not present

## 2023-01-22 DIAGNOSIS — Z952 Presence of prosthetic heart valve: Secondary | ICD-10-CM | POA: Diagnosis not present

## 2023-01-22 DIAGNOSIS — I69354 Hemiplegia and hemiparesis following cerebral infarction affecting left non-dominant side: Secondary | ICD-10-CM | POA: Diagnosis not present

## 2023-01-22 DIAGNOSIS — M179 Osteoarthritis of knee, unspecified: Secondary | ICD-10-CM | POA: Diagnosis not present

## 2023-01-22 DIAGNOSIS — L89023 Pressure ulcer of left elbow, stage 3: Secondary | ICD-10-CM | POA: Diagnosis not present

## 2023-01-22 DIAGNOSIS — Z7901 Long term (current) use of anticoagulants: Secondary | ICD-10-CM | POA: Diagnosis not present

## 2023-01-22 DIAGNOSIS — I119 Hypertensive heart disease without heart failure: Secondary | ICD-10-CM | POA: Diagnosis not present

## 2023-01-24 DIAGNOSIS — I4819 Other persistent atrial fibrillation: Secondary | ICD-10-CM | POA: Diagnosis not present

## 2023-01-24 DIAGNOSIS — I2510 Atherosclerotic heart disease of native coronary artery without angina pectoris: Secondary | ICD-10-CM | POA: Diagnosis not present

## 2023-01-24 DIAGNOSIS — Z7901 Long term (current) use of anticoagulants: Secondary | ICD-10-CM | POA: Diagnosis not present

## 2023-01-24 DIAGNOSIS — I69354 Hemiplegia and hemiparesis following cerebral infarction affecting left non-dominant side: Secondary | ICD-10-CM | POA: Diagnosis not present

## 2023-01-24 DIAGNOSIS — L89023 Pressure ulcer of left elbow, stage 3: Secondary | ICD-10-CM | POA: Diagnosis not present

## 2023-01-24 DIAGNOSIS — I119 Hypertensive heart disease without heart failure: Secondary | ICD-10-CM | POA: Diagnosis not present

## 2023-01-26 DIAGNOSIS — I119 Hypertensive heart disease without heart failure: Secondary | ICD-10-CM | POA: Diagnosis not present

## 2023-01-26 DIAGNOSIS — I69354 Hemiplegia and hemiparesis following cerebral infarction affecting left non-dominant side: Secondary | ICD-10-CM | POA: Diagnosis not present

## 2023-01-26 DIAGNOSIS — I2510 Atherosclerotic heart disease of native coronary artery without angina pectoris: Secondary | ICD-10-CM | POA: Diagnosis not present

## 2023-01-26 DIAGNOSIS — Z7901 Long term (current) use of anticoagulants: Secondary | ICD-10-CM | POA: Diagnosis not present

## 2023-01-26 DIAGNOSIS — L89023 Pressure ulcer of left elbow, stage 3: Secondary | ICD-10-CM | POA: Diagnosis not present

## 2023-01-26 DIAGNOSIS — I4819 Other persistent atrial fibrillation: Secondary | ICD-10-CM | POA: Diagnosis not present

## 2023-01-30 ENCOUNTER — Telehealth: Payer: Self-pay | Admitting: Family Medicine

## 2023-01-30 DIAGNOSIS — I69354 Hemiplegia and hemiparesis following cerebral infarction affecting left non-dominant side: Secondary | ICD-10-CM | POA: Diagnosis not present

## 2023-01-30 DIAGNOSIS — Z7901 Long term (current) use of anticoagulants: Secondary | ICD-10-CM | POA: Diagnosis not present

## 2023-01-30 DIAGNOSIS — I4819 Other persistent atrial fibrillation: Secondary | ICD-10-CM | POA: Diagnosis not present

## 2023-01-30 DIAGNOSIS — I2510 Atherosclerotic heart disease of native coronary artery without angina pectoris: Secondary | ICD-10-CM | POA: Diagnosis not present

## 2023-01-30 DIAGNOSIS — L89023 Pressure ulcer of left elbow, stage 3: Secondary | ICD-10-CM | POA: Diagnosis not present

## 2023-01-30 DIAGNOSIS — I119 Hypertensive heart disease without heart failure: Secondary | ICD-10-CM | POA: Diagnosis not present

## 2023-01-30 NOTE — Telephone Encounter (Signed)
HH ORDERS   Caller Name: Erenest Rasher  Home Health Agency Name: Iantha Fallen [facility] Callback Phone #: [phone], secure line, ok to leave VM  Service Requested: ST, PT, and OT  Frequency of Visits:  ST 1 week 6 due to swallowing deficit PT for bed mobility & equipment needs OT for self feeding & ADLs

## 2023-01-30 NOTE — Telephone Encounter (Signed)
Returned call to number provided for verbal orders per caller Lillia Abed wrong number not affiliated with [facility] agency  Iantha Fallen will wait for call from office.

## 2023-01-31 NOTE — Telephone Encounter (Signed)
Orders faxed over signed by Provider and faxed back.

## 2023-02-01 ENCOUNTER — Telehealth: Payer: Self-pay | Admitting: Family Medicine

## 2023-02-01 DIAGNOSIS — I4819 Other persistent atrial fibrillation: Secondary | ICD-10-CM | POA: Diagnosis not present

## 2023-02-01 DIAGNOSIS — L89023 Pressure ulcer of left elbow, stage 3: Secondary | ICD-10-CM | POA: Diagnosis not present

## 2023-02-01 DIAGNOSIS — Z7901 Long term (current) use of anticoagulants: Secondary | ICD-10-CM | POA: Diagnosis not present

## 2023-02-01 DIAGNOSIS — I119 Hypertensive heart disease without heart failure: Secondary | ICD-10-CM | POA: Diagnosis not present

## 2023-02-01 DIAGNOSIS — I2510 Atherosclerotic heart disease of native coronary artery without angina pectoris: Secondary | ICD-10-CM | POA: Diagnosis not present

## 2023-02-01 DIAGNOSIS — I69354 Hemiplegia and hemiparesis following cerebral infarction affecting left non-dominant side: Secondary | ICD-10-CM | POA: Diagnosis not present

## 2023-02-01 NOTE — Telephone Encounter (Signed)
Home Health verbal orders Caller Name:Sherice Agency Name: Iantha Fallen [facility]   Callback number: [phone], OK to leave VM, VM secure  Requesting OT/PT/Skilled nursing/Social Work/Speech: PT   Frequency:1W/1,2W/3,1W/1,2W/2 AND 1W/1.

## 2023-02-02 NOTE — Telephone Encounter (Signed)
Verbal given per [facility] written will be sent

## 2023-02-06 DIAGNOSIS — I2510 Atherosclerotic heart disease of native coronary artery without angina pectoris: Secondary | ICD-10-CM | POA: Diagnosis not present

## 2023-02-06 DIAGNOSIS — Z7901 Long term (current) use of anticoagulants: Secondary | ICD-10-CM | POA: Diagnosis not present

## 2023-02-06 DIAGNOSIS — I69354 Hemiplegia and hemiparesis following cerebral infarction affecting left non-dominant side: Secondary | ICD-10-CM | POA: Diagnosis not present

## 2023-02-06 DIAGNOSIS — I119 Hypertensive heart disease without heart failure: Secondary | ICD-10-CM | POA: Diagnosis not present

## 2023-02-06 DIAGNOSIS — I4819 Other persistent atrial fibrillation: Secondary | ICD-10-CM | POA: Diagnosis not present

## 2023-02-06 DIAGNOSIS — L89023 Pressure ulcer of left elbow, stage 3: Secondary | ICD-10-CM | POA: Diagnosis not present

## 2023-02-08 DIAGNOSIS — L89023 Pressure ulcer of left elbow, stage 3: Secondary | ICD-10-CM | POA: Diagnosis not present

## 2023-02-08 DIAGNOSIS — Z7901 Long term (current) use of anticoagulants: Secondary | ICD-10-CM | POA: Diagnosis not present

## 2023-02-08 DIAGNOSIS — I2510 Atherosclerotic heart disease of native coronary artery without angina pectoris: Secondary | ICD-10-CM | POA: Diagnosis not present

## 2023-02-08 DIAGNOSIS — I69354 Hemiplegia and hemiparesis following cerebral infarction affecting left non-dominant side: Secondary | ICD-10-CM | POA: Diagnosis not present

## 2023-02-08 DIAGNOSIS — I4819 Other persistent atrial fibrillation: Secondary | ICD-10-CM | POA: Diagnosis not present

## 2023-02-08 DIAGNOSIS — I119 Hypertensive heart disease without heart failure: Secondary | ICD-10-CM | POA: Diagnosis not present

## 2023-02-09 DIAGNOSIS — I4819 Other persistent atrial fibrillation: Secondary | ICD-10-CM | POA: Diagnosis not present

## 2023-02-09 DIAGNOSIS — I69354 Hemiplegia and hemiparesis following cerebral infarction affecting left non-dominant side: Secondary | ICD-10-CM | POA: Diagnosis not present

## 2023-02-09 DIAGNOSIS — Z7901 Long term (current) use of anticoagulants: Secondary | ICD-10-CM | POA: Diagnosis not present

## 2023-02-09 DIAGNOSIS — I119 Hypertensive heart disease without heart failure: Secondary | ICD-10-CM | POA: Diagnosis not present

## 2023-02-09 DIAGNOSIS — L89023 Pressure ulcer of left elbow, stage 3: Secondary | ICD-10-CM | POA: Diagnosis not present

## 2023-02-09 DIAGNOSIS — I2510 Atherosclerotic heart disease of native coronary artery without angina pectoris: Secondary | ICD-10-CM | POA: Diagnosis not present

## 2023-02-13 ENCOUNTER — Telehealth: Payer: Self-pay

## 2023-02-13 ENCOUNTER — Encounter (HOSPITAL_BASED_OUTPATIENT_CLINIC_OR_DEPARTMENT_OTHER): Payer: Medicare Other | Attending: General Surgery | Admitting: General Surgery

## 2023-02-13 ENCOUNTER — Telehealth: Payer: Self-pay | Admitting: Family Medicine

## 2023-02-13 DIAGNOSIS — L89023 Pressure ulcer of left elbow, stage 3: Secondary | ICD-10-CM | POA: Insufficient documentation

## 2023-02-13 DIAGNOSIS — M1990 Unspecified osteoarthritis, unspecified site: Secondary | ICD-10-CM | POA: Diagnosis not present

## 2023-02-13 DIAGNOSIS — N400 Enlarged prostate without lower urinary tract symptoms: Secondary | ICD-10-CM | POA: Diagnosis not present

## 2023-02-13 DIAGNOSIS — Z833 Family history of diabetes mellitus: Secondary | ICD-10-CM | POA: Insufficient documentation

## 2023-02-13 DIAGNOSIS — L89323 Pressure ulcer of left buttock, stage 3: Secondary | ICD-10-CM | POA: Diagnosis not present

## 2023-02-13 DIAGNOSIS — I2510 Atherosclerotic heart disease of native coronary artery without angina pectoris: Secondary | ICD-10-CM | POA: Insufficient documentation

## 2023-02-13 DIAGNOSIS — Z86718 Personal history of other venous thrombosis and embolism: Secondary | ICD-10-CM | POA: Diagnosis not present

## 2023-02-13 DIAGNOSIS — I10 Essential (primary) hypertension: Secondary | ICD-10-CM | POA: Insufficient documentation

## 2023-02-13 DIAGNOSIS — I69352 Hemiplegia and hemiparesis following cerebral infarction affecting left dominant side: Secondary | ICD-10-CM | POA: Insufficient documentation

## 2023-02-13 DIAGNOSIS — I69354 Hemiplegia and hemiparesis following cerebral infarction affecting left non-dominant side: Secondary | ICD-10-CM | POA: Diagnosis not present

## 2023-02-13 DIAGNOSIS — Z7901 Long term (current) use of anticoagulants: Secondary | ICD-10-CM | POA: Diagnosis not present

## 2023-02-13 DIAGNOSIS — I119 Hypertensive heart disease without heart failure: Secondary | ICD-10-CM | POA: Diagnosis not present

## 2023-02-13 DIAGNOSIS — I4819 Other persistent atrial fibrillation: Secondary | ICD-10-CM | POA: Insufficient documentation

## 2023-02-13 NOTE — Progress Notes (Signed)
Brian [name] ([id number]) [id number] Page 1 of 7 Visit Report for 02/13/2023 Arrival Information Details Patient Name: Date of Service: Brian [name] 02/13/2023 2:15 PM Medical Record Number: [id number] Patient Account Number: [id number] Date of Birth/Sex: Treating RN: 1932-11-12 (89 y.o. [name] Primary Care Johntae Broxterman: Nadene Rubins Other Clinician: Referring Tristyn Pharris: Treating Maisy Newport/Extender: [name] in Treatment: 52 Visit Information History Since Last Visit Added or deleted any medications: No Patient Arrived: Wheel Chair Any new allergies or adverse reactions: No Arrival Time: 14:21 Had a fall or experienced change in No Accompanied By: son activities of daily living that may affect Transfer Assistance: None risk of falls: Patient Identification Verified: Yes Signs or symptoms of abuse/neglect since last visito No Secondary Verification Process Completed: Yes Hospitalized since last visit: No Patient Requires Transmission-Based Precautions: No Implantable device outside of the clinic excluding No Patient Has Alerts: Yes cellular tissue based products placed in the center Patient Alerts: Patient on Blood Thinner since last visit: Has Dressing in Place as Prescribed: Yes Pain Present Now: Yes Electronic Signature(s) Signed: 02/13/2023 3:31:57 PM By: Samuella Bruin Entered By: Samuella Bruin on 02/13/2023 14:22:15 -------------------------------------------------------------------------------- Encounter Discharge Information Details Patient Name: Date of Service: Brian [name] 02/13/2023 2:15 PM Medical Record Number: [id number] Patient Account Number: [id number] Date of Birth/Sex: Treating RN: [birth year] (89 y.o. [name] Primary Care Anoop Hemmer: Nadene Rubins Other Clinician: Referring Cheynne Virden: Treating Tinisha Etzkorn/Extender: [name]  in Treatment: 52 Encounter Discharge Information Items Post Procedure Vitals Discharge Condition: Stable Temperature (F): 98.6 Ambulatory Status: Wheelchair Pulse (bpm): 76 Discharge Destination: Home Respiratory Rate (breaths/min): 18 Transportation: Private Auto Blood Pressure (mmHg): 127/68 Accompanied By: son Schedule Follow-up Appointment: Yes Clinical Summary of Care: Patient Declined Electronic Signature(s) Signed: 02/13/2023 3:31:57 PM By: Samuella Bruin Entered By: Samuella Bruin on 02/13/2023 14:34:23 Brian [name], Brian [name] ([id number]) [id number] Page 2 of 7 -------------------------------------------------------------------------------- Lower Extremity Assessment Details Patient Name: Date of Service: Brian [name] 02/13/2023 2:15 PM Medical Record Number: [id number] Patient Account Number: [id number] Date of Birth/Sex: Treating RN: May 31, 1933 (89 y.o. [name] Primary Care Roshawna Colclasure: Nadene Rubins Other Clinician: Referring Shekita Boyden: Treating Rainbow Salman/Extender: [name] in Treatment: 52 Electronic Signature(s) Signed: 02/13/2023 3:31:57 PM By: Gelene Mink By: Samuella Bruin on 02/13/2023 14:22:46 -------------------------------------------------------------------------------- Multi Wound Chart Details Patient Name: Date of Service: Brian [name] 02/13/2023 2:15 PM Medical Record Number: [id number] Patient Account Number: [id number] Date of Birth/Sex: Treating RN: November 25, 1932 (89 y.o. [name]) Primary Care Tzirel Leonor: Nadene Rubins Other Clinician: Referring Brian [name]: Treating Aletha Allebach/Extender: [name] in Treatment: 52 Vital Signs Height(in): 66 Pulse(bpm): 76 Weight(lbs): 185 Blood Pressure(mmHg): 127/68 Body Mass Index(BMI): 29.9 Temperature(F): 98.6 Respiratory Rate(breaths/min): 18 [1:Photos:] [N/A:N/A] Left Elbow N/A N/A Wound  Location: Pressure Injury N/A N/A Wounding Event: Pressure Ulcer N/A N/A Primary Etiology: Cataracts, Arrhythmia, Coronary N/A N/A Comorbid History: Artery Disease, Hypotension, Osteoarthritis 12/19/2021 N/A N/A Date Acquired: 47 N/A N/A Weeks of Treatment: Open N/A N/A Wound Status: No N/A N/A Wound Recurrence: Yes N/A N/A Clustered Wound: 1.5x1.6x0.2 N/A N/A Measurements L x W x D (cm) 1.885 N/A N/A A (cm) : rea 0.377 N/A N/A Volume (cm) : 80.40% N/A N/A % Reduction in A rea: 60.80% N/A N/A % Reduction in Volume: Category/Stage III N/A N/A Classification: Medium N/A N/A Exudate A mount: Serosanguineous N/A N/A Exudate Type: red, brown N/A N/A Exudate  Color: Distinct, outline attached N/A N/A Wound Margin: Large (67-100%) N/A N/A Granulation A mount: Brian [name] ([id number]) [id number] Page 3 of 7 Red, Hyper-granulation N/A N/A Granulation Quality: Small (1-33%) N/A N/A Necrotic Amount: Fat Layer (Subcutaneous Tissue): Yes N/A N/A Exposed Structures: Fascia: No Tendon: No Muscle: No Joint: No Bone: No Medium (34-66%) N/A N/A Epithelialization: Debridement - Selective/Open Wound N/A N/A Debridement: Pre-procedure Verification/Time Out 14:29 N/A N/A Taken: Lidocaine 4% Topical Solution N/A N/A Pain Control: Slough N/A N/A Tissue Debrided: Non-Viable Tissue N/A N/A Level: 1.88 N/A N/A Debridement A (sq cm): rea Curette N/A N/A Instrument: Minimum N/A N/A Bleeding: Pressure N/A N/A Hemostasis A chieved: Procedure was tolerated well N/A N/A Debridement Treatment Response: 1.5x1.6x0.2 N/A N/A Post Debridement Measurements L x W x D (cm) 0.377 N/A N/A Post Debridement Volume: (cm) Category/Stage III N/A N/A Post Debridement Stage: No Abnormalities Noted N/A N/A Periwound Skin Texture: No Abnormalities Noted N/A N/A Periwound Skin Moisture: No Abnormalities Noted N/A N/A Periwound Skin Color: No Abnormality  N/A N/A Temperature: Yes N/A N/A Tenderness on Palpation: Debridement N/A N/A Procedures Performed: Treatment Notes Wound #1 (Elbow) Wound Laterality: Left Cleanser Soap and Water Discharge Instruction: May shower and wash wound with dial antibacterial soap and water prior to dressing change. Wound Cleanser Discharge Instruction: Cleanse the wound with wound cleanser prior to applying a clean dressing using gauze sponges, not tissue or cotton balls. Peri-Wound Care Topical Primary Dressing Hydrofera Blue Ready Transfer Foam, 2.5x2.5 (in/in) Discharge Instruction: Apply directly to wound bed as directed Secondary Dressing ALLEVYN Gentle Border, 5x5 (in/in) Discharge Instruction: Apply over primary dressing as directed. Secured With Compression Wrap Compression Stockings Add-Ons Engineer, maintenance, 6 (in) Electronic Signature(s) Signed: 02/13/2023 2:41:54 PM By: Duanne Guess MD FACS Entered By: Duanne Guess on 02/13/2023 14:41:54 -------------------------------------------------------------------------------- Multi-Disciplinary Care Plan Details Patient Name: Date of Service: Brian [name] 02/13/2023 2:15 PM Medical Record Number: [id number] Patient Account Number: [id number] Date of Birth/Sex: Treating RN: 25-Apr-1933 (89 y.o. Brian [name], Brian [name], Brian Judie Petit ([id number]) [phone].pdf Page 4 of 7 Primary Care Reanna Scoggin: Nadene Rubins Other Clinician: Referring Aideen Fenster: Treating Gurkirat Basher/Extender: [name] in Treatment: 52 Multidisciplinary Care Plan reviewed with physician Active Inactive Abuse / Safety / Falls / Self Care Management Nursing Diagnoses: Impaired physical mobility Potential for falls Goals: Patient/caregiver will verbalize understanding of skin care regimen Date Initiated: 02/14/2022 Target Resolution Date: 04/21/2023 Goal Status: Active Patient/caregiver will  verbalize/demonstrate measures taken to prevent injury and/or falls Date Initiated: 02/14/2022 Target Resolution Date: 04/21/2023 Goal Status: Active Interventions: Assess fall risk on admission and as needed Assess self care needs on admission and as needed Notes: Wound/Skin Impairment Nursing Diagnoses: Impaired tissue integrity Knowledge deficit related to ulceration/compromised skin integrity Goals: Patient/caregiver will verbalize understanding of skin care regimen Date Initiated: 02/14/2022 Target Resolution Date: 04/21/2023 Goal Status: Active Ulcer/skin breakdown will have a volume reduction of 30% by week 4 Date Initiated: 02/14/2022 Date Inactivated: 05/23/2022 Target Resolution Date: 05/05/2022 Goal Status: Unmet Unmet Reason: new PU left glut Interventions: Assess patient/caregiver ability to obtain necessary supplies Assess patient/caregiver ability to perform ulcer/skin care regimen upon admission and as needed Assess ulceration(s) every visit Treatment Activities: Skin care regimen initiated : 02/14/2022 Topical wound management initiated : 02/14/2022 Notes: Electronic Signature(s) Signed: 02/13/2023 3:31:57 PM By: Samuella Bruin Entered By: Samuella Bruin on 02/13/2023 14:32:46 -------------------------------------------------------------------------------- Pain Assessment Details Patient Name: Date of Service: Brian [name], Brian [name] 02/13/2023 2:15 PM Medical  Record Number: [id number] Patient Account Number: [id number] Date of Birth/Sex: Treating RN: 31-Dec-1932 (89 y.o. [name] Primary Care Xaria Judon: Nadene Rubins Other Clinician: Referring Eyden Dobie: Treating Tieasha Larsen/Extender: [name] in Treatment: [address], [place] Judie Petit ([id number]) [id number] Page 5 of 7 Active Problems Location of Pain Severity and Description of Pain Patient Has Paino Yes Site Locations Pain Location: Pain in  Ulcers Duration of the Pain. Constant / Intermittento Intermittent Rate the pain. Current Pain Level: 4 Pain Management and Medication Current Pain Management: Electronic Signature(s) Signed: 02/13/2023 3:31:57 PM By: Samuella Bruin Entered By: Samuella Bruin on 02/13/2023 14:22:43 -------------------------------------------------------------------------------- Patient/Caregiver Education Details Patient Name: Date of Service: Brian [name] 6/25/2024andnbsp2:15 PM Medical Record Number: [id number] Patient Account Number: [id number] Date of Birth/Gender: Treating RN: Dec 21, 1932 (89 y.o. [name] Primary Care Physician: Nadene Rubins Other Clinician: Referring Physician: Treating Physician/Extender: [name] in Treatment: 61 Education Assessment Education Provided To: Patient Education Topics Provided Safety: Methods: Explain/Verbal Responses: Reinforcements needed, State content correctly Electronic Signature(s) Signed: 02/13/2023 3:31:57 PM By: Samuella Bruin Entered By: Samuella Bruin on 02/13/2023 14:32:56 Brian [name], Brian [name] ([id number]) [id number] Page 6 of 7 -------------------------------------------------------------------------------- Wound Assessment Details Patient Name: Date of Service: Brian [name] 02/13/2023 2:15 PM Medical Record Number: [id number] Patient Account Number: [id number] Date of Birth/Sex: Treating RN: Mar 22, 1933 (89 y.o. [name] Primary Care Kyren Vaux: Nadene Rubins Other Clinician: Referring Emon Miggins: Treating Machi Whittaker/Extender: [name] in Treatment: 52 Wound Status Wound Number: 1 Primary Pressure Ulcer Etiology: Wound Location: Left Elbow Wound Status: Open Wounding Event: Pressure Injury Comorbid Cataracts, Arrhythmia, Coronary Artery Disease, Date Acquired: 12/19/2021 History: Hypotension,  Osteoarthritis Weeks Of Treatment: 52 Clustered Wound: Yes Photos Wound Measurements Length: (cm) 1.5 Width: (cm) 1.6 Depth: (cm) 0.2 Area: (cm) 1.885 Volume: (cm) 0.377 % Reduction in Area: 80.4% % Reduction in Volume: 60.8% Epithelialization: Medium (34-66%) Tunneling: No Undermining: No Wound Description Classification: Category/Stage III Wound Margin: Distinct, outline attached Exudate Amount: Medium Exudate Type: Serosanguineous Exudate Color: red, brown Foul Odor After Cleansing: No Slough/Fibrino Yes Wound Bed Granulation Amount: Large (67-100%) Exposed Structure Granulation Quality: Red, Hyper-granulation Fascia Exposed: No Necrotic Amount: Small (1-33%) Fat Layer (Subcutaneous Tissue) Exposed: Yes Necrotic Quality: Adherent Slough Tendon Exposed: No Muscle Exposed: No Joint Exposed: No Bone Exposed: No Periwound Skin Texture Texture Color No Abnormalities Noted: Yes No Abnormalities Noted: Yes Moisture Temperature / Pain No Abnormalities Noted: Yes Temperature: No Abnormality Tenderness on Palpation: Yes Treatment Notes Wound #1 (Elbow) Wound Laterality: Left Cleanser Soap and Water Discharge Instruction: May shower and wash wound with dial antibacterial soap and water prior to dressing change. Wound Cleanser Discharge Instruction: Cleanse the wound with wound cleanser prior to applying a clean dressing using gauze sponges, not tissue or cotton balls. Peri-Wound Care Brian [name], Brian [name] ([id number]) [id number] Page 7 of 7 Topical Primary Dressing Hydrofera Blue Ready Transfer Foam, 2.5x2.5 (in/in) Discharge Instruction: Apply directly to wound bed as directed Secondary Dressing ALLEVYN Gentle Border, 5x5 (in/in) Discharge Instruction: Apply over primary dressing as directed. Secured With Compression Wrap Compression Stockings Add-Ons Engineer, maintenance, 6 (in) Electronic Signature(s) Signed: 02/13/2023 3:31:57 PM By:  Samuella Bruin Entered By: Samuella Bruin on 02/13/2023 14:25:44 -------------------------------------------------------------------------------- Vitals Details Patient Name: Date of Service: Brian [name] 02/13/2023 2:15 PM Medical Record Number: [id number] Patient Account Number: [id number] Date of Birth/Sex: Treating RN: [birth year] (89 y.o. [name] Primary Care Santina Trillo:  Nadene Rubins Other Clinician: Referring Danylle Ouk: Treating Jospeh Mangel/Extender: [name] in Treatment: 52 Vital Signs Time Taken: 14:22 Temperature (F): 98.6 Height (in): 66 Pulse (bpm): 76 Weight (lbs): 185 Respiratory Rate (breaths/min): 18 Body Mass Index (BMI): 29.9 Blood Pressure (mmHg): 127/68 Reference Range: 80 - 120 mg / dl Electronic Signature(s) Signed: 02/13/2023 3:31:57 PM By: Samuella Bruin Entered By: Samuella Bruin on 02/13/2023 14:22:32

## 2023-02-13 NOTE — Telephone Encounter (Signed)
error 

## 2023-02-13 NOTE — Progress Notes (Signed)
Brian, [name] ([id number]) [id number] Page 1 of 10 Visit Report for 02/13/2023 Chief Complaint Document Details Patient Name: Date of Service: [name] ID M. 02/13/2023 2:15 PM Medical Record Number: [id number] Patient Account Number: [id number] Date of Birth/Sex: Treating RN: 1932-11-22 (89 y.o. M) Primary Care Provider: Nadene Rubins Other Clinician: Referring Provider: Treating Provider/Extender: [name] in Treatment: 52 Information Obtained from: Patient Chief Complaint 08/05/2019; patient is here for review of pressure ulcers on his buttock 02/14/22: patient is here for review of pressure ulcers on his left elbow Electronic Signature(s) Signed: 02/13/2023 2:42:01 PM By: Duanne Guess MD FACS Entered By: Duanne Guess on 02/13/2023 14:42:01 -------------------------------------------------------------------------------- Debridement Details Patient Name: Date of Service: [name] ID M. 02/13/2023 2:15 PM Medical Record Number: [id number] Patient Account Number: [id number] Date of Birth/Sex: Treating RN: 1933-01-21 (89 y.o. [name] Primary Care Provider: Nadene Rubins Other Clinician: Referring Provider: Treating Provider/Extender: [name] in Treatment: 52 Debridement Performed for Assessment: Wound #1 Left Elbow Performed By: Physician Duanne Guess, MD Debridement Type: Debridement Level of Consciousness (Pre-procedure): Awake and Alert Pre-procedure Verification/Time Out Yes - 14:29 Taken: Start Time: 14:29 Pain Control: Lidocaine 4% T opical Solution Percent of Wound Bed Debrided: 100% T Area Debrided (cm): otal 1.88 Tissue and other material debrided: Non-Viable, Slough, Slough Level: Non-Viable Tissue Debridement Description: Selective/Open Wound Instrument: Curette Bleeding: Minimum Hemostasis Achieved: Pressure Response to Treatment:  Procedure was tolerated well Level of Consciousness (Post- Awake and Alert procedure): Post Debridement Measurements of Total Wound Length: (cm) 1.5 Stage: Category/Stage III Width: (cm) 1.6 Depth: (cm) 0.2 Volume: (cm) 0.377 Character of Wound/Ulcer Post Debridement: Improved Post Procedure Diagnosis Rueben [name] ([id number]) [id number] Page 2 of 10 Same as Pre-procedure Notes scribed for Dr. Lady Gary by Samuella Bruin, RN Electronic Signature(s) Signed: 02/13/2023 3:31:57 PM By: Samuella Bruin Signed: 02/13/2023 3:41:39 PM By: Duanne Guess MD FACS Entered By: Samuella Bruin on 02/13/2023 14:31:57 -------------------------------------------------------------------------------- HPI Details Patient Name: Date of Service: [name] ID M. 02/13/2023 2:15 PM Medical Record Number: [id number] Patient Account Number: [id number] Date of Birth/Sex: Treating RN: 05-Jul-1933 (89 y.o. M) Primary Care Provider: Nadene Rubins Other Clinician: Referring Provider: Treating Provider/Extender: [name] in Treatment: 52 History of Present Illness HPI Description: ADMISSION 08/05/19 This is an 89 year old man who arrives in clinic accompanied by his son. 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 Apparently noted recently to have a stage I pressure area on the left buttock. He does not have a wound history. He does have been air fluidized mattress. They have a wheelchair cushion as well as a pillow over the top of this. He is not incontinent of urine or bowel. Past medical history includes hypertension, osteoarthritis of the knee, atrial fibrillation, history of mitral valve repair, right hemisphere CVA with left hemiparesis, history of a DVT READMISSION 02/14/2022 The patient is here today for evaluation of pressure ulcers on his left elbow and possible right buttock ulcer.  Apparently when he was seen by Dr. Leanord Hawking in 2020, no ulcer was appreciated on the buttock and no further wound care involvement occurred. 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 He does have home health aides and they are turning him every 2 hours side to side. He  is on a memory foam mattress at this time. 03/14/2022: The patient was scheduled to see me on July 11, but apparently his son panicked about some bright red blood per rectum and rushed him to the emergency room. There is also a comment in the electronic medical record that the patient's son thought the bone was exposed on his elbow. After waiting a prolonged period of time at the emergency department, they left without being seen. Today, it appears that the dressing on the patient's elbow was never changed since our last visit. The 2 wounds have converged creating a larger wound. According to the intake nurse, there was a foul odor from the wound and dressing. There is extensive slough on the wound surface. The bleeding per rectum turns out to be hemorrhoids. The patient's son expresses that he does not know how to manage this. 04/07/2022: For unclear reasons, the patient has not returned to clinic until they were contacted today and they made an add-on visit. The patient's son was not originally present at the beginning of the visit but showed up about 25 minutes into the visit. The patient was accompanied by a home aide. She reported that when she cleaned the patient up today, there was just a Band-Aid on his wound. She said the surface was fairly mucky but she was able to wash this off. It is abundantly clear that this wound is not being properly cared for. Apparently he did have [facility] home health but they have not been out in some time and it is not clear the reason for this. The surface of the wound is a bit  cleaner so perhaps the Santyl has been applied, but unfortunately there is now a deep tunnel that extends up from the elbow to the patient's posterior upper arm for a number centimeters. There is no evidence of infection or purulent drainage. It is also clear that the patient continues to spend a substantial portion of his time leaning on the elbow. 05/23/2022: Once again, the patient has failed to return to clinic for about 6 weeks and the reason remains unclear. He is apparently receiving home health assistance. The wound on his elbow is covered with hypertrophic granulation tissue. He also has a blister just distal to the wound on his posterior forearm. He has a new stage III pressure ulcer on his left gluteus with slough accumulation. He apparently spends a substantial portion of his days sitting in a wheelchair. I do not believe he has a Museum/gallery conservator. Due to his stroke, he lists to the left side, which is how his ulcer on his elbow developed. I suspect the gluteal ulcer is as a result of this, as well. 06/13/2022: The wound on his left gluteus is nearly healed. It is very superficial and quite clean. His son reports that he has just been applying Desitin to the area. The elbow wound is smaller but still has some hypertrophic granulation tissue and slough accumulation. No concern for infection at either site. 06/27/2022: The buttock wounds are healed. There is some tissue maceration without any significant skin breakdown. The elbow wound continues to contract. There is still some hypertrophic granulation tissue and slough buildup. 07/25/2022: The buttock wounds have reopened. The periwound skin has been kept very dry through liberal use of zinc oxide, however. The elbow wound dressing did not look like it had been changed in at least a week when it was removed. The underlying wound, however has not reaccumulated any hypertrophic granulation tissue; there  is a thin layer of slough present. No malodor  or purulent drainage from the wound itself. 08/15/2022: There is a tiny opening remaining on the buttocks. It is clean and there is no evidence of tissue maceration. The elbow looks very good. There is good beefy tissue present and it is flush with the surrounding skin. Home health has been coming out. 09/05/2022: The wound on his bottom is healed. The elbow shows signs of pressure induced deep tissue injury and also has some hypertrophic granulation tissue. DREXEL, IVEY ([id number]) [id number] Page 3 of 10 09/26/2022: The elbow looks much better today. There is a little bit of slough on the surface but there is no undermining or tunneling. He does have some hypertrophic granulation tissue accumulation. 10/17/2022: His elbow wound continues to improve. It is smaller, cleaner, and more superficial. There is hypertrophic granulation tissue present. 11/14/2022: His elbow ulcer is smaller again today. There is some slough on the surface. 12/19/2022: The pressure ulcer on his elbow is smaller again today. There is an area that looks like he may have bumped it on something, as it is a little bit bruised and purpleish. He is also requesting an order for physical therapy. 01/16/2023: The pressure ulcer on his elbow looks better than I have ever seen it. It is smaller and more superficial with very little slough on the surface. 02/13/2023: The wound is just the tiniest bit smaller today. There is a little slough on the surface. The tissue around the wound does look a bit purpleish, as though he has been allowed to lean on it, unfortunately. Electronic Signature(s) Signed: 02/13/2023 2:43:06 PM By: Duanne Guess MD FACS Entered By: Duanne Guess on 02/13/2023 14:43:06 -------------------------------------------------------------------------------- Physical Exam Details Patient Name: Date of Service: [name] ID M. 02/13/2023 2:15 PM Medical Record Number:  [id number] Patient Account Number: [id number] Date of Birth/Sex: Treating RN: 1932/11/08 (89 y.o. M) Primary Care Provider: Nadene Rubins Other Clinician: Referring Provider: Treating Provider/Extender: [name] in Treatment: 52 Constitutional . . . . no acute distress. Respiratory Normal work of breathing on room air. Notes 02/13/2023: The wound is just the tiniest bit smaller today. There is a little slough on the surface. The tissue around the wound does look a bit purpleish, as though he has been allowed to lean on it, unfortunately. Electronic Signature(s) Signed: 02/13/2023 2:44:39 PM By: Duanne Guess MD FACS Entered By: Duanne Guess on 02/13/2023 14:44:39 -------------------------------------------------------------------------------- Physician Orders Details Patient Name: Date of Service: [name] ID M. 02/13/2023 2:15 PM Medical Record Number: [id number] Patient Account Number: [id number] Date of Birth/Sex: Treating RN: Dec 17, 1932 (89 y.o. [name] Primary Care Provider: Nadene Rubins Other Clinician: Referring Provider: Treating Provider/Extender: [name] in Treatment: 21 Verbal / Phone Orders: No Diagnosis Coding ICD-10 Coding Code Description L89.023 Pressure ulcer of left elbow, stage 3 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 I48.19 Other persistent atrial fibrillation KI, CORBO ([id number]) [id number] Page 4 of 10 I25.10 Atherosclerotic heart disease of native coronary artery without angina pectoris Follow-up Appointments Return appointment in 1 month. - Dr. Lady Gary - room 2 - 8/6 at 2:15 PM Anesthetic (In clinic) Topical Lidocaine 4% applied to wound bed Bathing/ Shower/ Hygiene May shower and wash wound with soap and water. - with dressing changes Off-Loading Turn and reposition every 2 hours - stay  off of left elbow wear prevalon boot on elbow as directed Other: -  limit time in wheelchair, reposition in wheelchair every 1-2 hours Home Health No change in wound care orders this week; continue Home Health for wound care. May utilize formulary equivalent dressing for wound treatment orders unless otherwise specified. - physical therapy once a week, wound care twice a week Other Home Health Orders/Instructions: - [id number] Wound Treatment Wound #1 - Elbow Wound Laterality: Left Cleanser: Soap and Water Every Other Day/30 Days Discharge Instructions: May shower and wash wound with dial antibacterial soap and water prior to dressing change. Cleanser: Wound Cleanser (Generic) Every Other Day/30 Days Discharge Instructions: Cleanse the wound with wound cleanser prior to applying a clean dressing using gauze sponges, not tissue or cotton balls. Prim Dressing: Hydrofera Blue Ready Transfer Foam, 2.5x2.5 (in/in) Every Other Day/30 Days ary Discharge Instructions: Apply directly to wound bed as directed Secondary Dressing: ALLEVYN Gentle Border, 5x5 (in/in) (Generic) Every Other Day/30 Days Discharge Instructions: Apply over primary dressing as directed. Add-Ons: Cotton Tip Applicator, 6 (in) (Generic) Every Other Day/30 Days Patient Medications llergies: penicillin, Novocain A Notifications Medication Indication Start End 02/13/2023 lidocaine DOSE topical 4 % cream - cream topical Electronic Signature(s) Signed: 02/13/2023 3:41:39 PM By: Duanne Guess MD FACS Entered By: Duanne Guess on 02/13/2023 14:44:52 -------------------------------------------------------------------------------- Problem List Details Patient Name: Date of Service: [name] ID M. 02/13/2023 2:15 PM Medical Record Number: [id number] Patient Account Number: [id number] Date of Birth/Sex: Treating RN: 1933-05-15 (89 y.o. M) Primary Care Provider: Nadene Rubins Other Clinician: Referring Provider: Treating  Provider/Extender: [name] in Treatment: 52 Active Problems ICD-10 Encounter Code Description Active Date MDM Diagnosis L89.023 Pressure ulcer of left elbow, stage 3 02/14/2022 No Yes TYLEE, NEWBY ([id number]) [id number] Page 5 of 10 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 02/14/2022 No Yes I48.19 Other persistent atrial fibrillation 02/14/2022 No Yes I25.10 Atherosclerotic heart disease of native coronary artery without angina pectoris 02/14/2022 No Yes Inactive Problems ICD-10 Code Description Active Date Inactive Date L89.323 Pressure ulcer of left buttock, stage 3 05/23/2022 05/23/2022 Resolved Problems Electronic Signature(s) Signed: 02/13/2023 2:41:49 PM By: Duanne Guess MD FACS Entered By: Duanne Guess on 02/13/2023 14:41:49 -------------------------------------------------------------------------------- Progress Note Details Patient Name: Date of Service: [name] ID M. 02/13/2023 2:15 PM Medical Record Number: [id number] Patient Account Number: [id number] Date of Birth/Sex: Treating RN: May 07, 1933 (89 y.o. M) Primary Care Provider: Nadene Rubins Other Clinician: Referring Provider: Treating Provider/Extender: [name] in Treatment: 52 Subjective Chief Complaint Information obtained from Patient 08/05/2019; patient is here for review of pressure ulcers on his buttock 02/14/22: patient is here for review of pressure ulcers on his left elbow History of Present Illness (HPI) ADMISSION 08/05/19 This is an 89 year old man who arrives in clinic accompanied by his son. 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 Apparently noted recently to have a stage I pressure area on the left buttock. He does not have a wound history. He does have been air fluidized mattress. They have a wheelchair cushion as well  as a pillow over the top of this. He is not incontinent of urine or bowel. Past medical history includes hypertension, osteoarthritis of the knee, atrial fibrillation, history of mitral valve repair, right hemisphere CVA with left hemiparesis, history of a DVT READMISSION 02/14/2022 The patient is here today for evaluation of pressure ulcers on his left elbow and possible right buttock ulcer. Apparently when he was seen by Dr. Leanord Hawking in  2020, no ulcer was appreciated on the buttock and no further wound care involvement occurred. 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 He does have home health aides and they are turning him every 2 hours side to side. He is on a memory foam mattress at this time. 03/14/2022: The patient was scheduled to see me on July 11, but apparently his son panicked about some bright red blood per rectum and rushed him to the emergency room. There is also a comment in the electronic medical record that the patient's son thought the bone was exposed on his elbow. After waiting a prolonged period of time at the emergency department, they left without being seen. Today, it appears that the dressing on the patient's elbow was never changed since our last visit. The 2 wounds have converged creating a larger wound. According to the intake nurse, there was a foul odor from the wound and dressing. There is extensive slough on the wound surface. The bleeding per rectum turns out to be hemorrhoids. The patient's son expresses that he does not know how to manage this. 04/07/2022: For unclear reasons, the patient has not returned to clinic until they were contacted today and they made an add-on visit. The patient's son was not originally present at the beginning of the visit but showed up about 25 minutes into the visit. The patient was accompanied by a home aide. She  reported that CHRISHON, MARTINO ([id number]) [id number] Page 6 of 10 when she cleaned the patient up today, there was just a Band-Aid on his wound. She said the surface was fairly mucky but she was able to wash this off. It is abundantly clear that this wound is not being properly cared for. Apparently he did have [facility] home health but they have not been out in some time and it is not clear the reason for this. The surface of the wound is a bit cleaner so perhaps the Santyl has been applied, but unfortunately there is now a deep tunnel that extends up from the elbow to the patient's posterior upper arm for a number centimeters. There is no evidence of infection or purulent drainage. It is also clear that the patient continues to spend a substantial portion of his time leaning on the elbow. 05/23/2022: Once again, the patient has failed to return to clinic for about 6 weeks and the reason remains unclear. He is apparently receiving home health assistance. The wound on his elbow is covered with hypertrophic granulation tissue. He also has a blister just distal to the wound on his posterior forearm. He has a new stage III pressure ulcer on his left gluteus with slough accumulation. He apparently spends a substantial portion of his days sitting in a wheelchair. I do not believe he has a Museum/gallery conservator. Due to his stroke, he lists to the left side, which is how his ulcer on his elbow developed. I suspect the gluteal ulcer is as a result of this, as well. 06/13/2022: The wound on his left gluteus is nearly healed. It is very superficial and quite clean. His son reports that he has just been applying Desitin to the area. The elbow wound is smaller but still has some hypertrophic granulation tissue and slough accumulation. No concern for infection at either site. 06/27/2022: The buttock wounds are healed. There is some tissue maceration without any  significant skin breakdown. The elbow  wound continues to contract. There is still some hypertrophic granulation tissue and slough buildup. 07/25/2022: The buttock wounds have reopened. The periwound skin has been kept very dry through liberal use of zinc oxide, however. The elbow wound dressing did not look like it had been changed in at least a week when it was removed. The underlying wound, however has not reaccumulated any hypertrophic granulation tissue; there is a thin layer of slough present. No malodor or purulent drainage from the wound itself. 08/15/2022: There is a tiny opening remaining on the buttocks. It is clean and there is no evidence of tissue maceration. The elbow looks very good. There is good beefy tissue present and it is flush with the surrounding skin. Home health has been coming out. 09/05/2022: The wound on his bottom is healed. The elbow shows signs of pressure induced deep tissue injury and also has some hypertrophic granulation tissue. 09/26/2022: The elbow looks much better today. There is a little bit of slough on the surface but there is no undermining or tunneling. He does have some hypertrophic granulation tissue accumulation. 10/17/2022: His elbow wound continues to improve. It is smaller, cleaner, and more superficial. There is hypertrophic granulation tissue present. 11/14/2022: His elbow ulcer is smaller again today. There is some slough on the surface. 12/19/2022: The pressure ulcer on his elbow is smaller again today. There is an area that looks like he may have bumped it on something, as it is a little bit bruised and purpleish. He is also requesting an order for physical therapy. 01/16/2023: The pressure ulcer on his elbow looks better than I have ever seen it. It is smaller and more superficial with very little slough on the surface. 02/13/2023: The wound is just the tiniest bit smaller today. There is a little slough on the surface. The tissue around the wound does look a bit purpleish, as though he has  been allowed to lean on it, unfortunately. Patient History Information obtained from Patient. Family History Diabetes - Maternal Grandparents, Heart Disease - Mother, Hypertension - Mother, No family history of Cancer, Hereditary Spherocytosis, Kidney Disease, Lung Disease, Seizures, Stroke, Thyroid Problems, Tuberculosis. Social History Never smoker, Marital Status - Married, Alcohol Use - Never, Drug Use - No History, Caffeine Use - Never. Medical History Eyes Patient has history of Cataracts - removed Cardiovascular Patient has history of Arrhythmia - Atrial Flutter/Atrial Fib, Coronary Artery Disease, Hypotension Denies history of Hypertension Musculoskeletal Patient has history of Osteoarthritis Neurologic Denies history of Paraplegia Hospitalization/Surgery History - open heart surgery. - IR generic histoircal. - appendectomy. - hernia repair. - joint replacement. - mitral valve repair. - mvp repair. - total hip arthroplasty. Medical A Surgical History Notes nd Constitutional Symptoms (General Health) obseity Ear/Nose/Mouth/Throat wears hearing aids Cardiovascular Mitral Valve Repair, Genitourinary BPH Neurologic CVA 2018, left side hemiparesis Objective KELDEN, LAVALLEE ([id number]) [id number] Page 7 of 10 Constitutional no acute distress. Vitals Time Taken: 2:22 PM, Height: 66 in, Weight: 185 lbs, BMI: 29.9, Temperature: 98.6 F, Pulse: 76 bpm, Respiratory Rate: 18 breaths/min, Blood Pressure: 127/68 mmHg. Respiratory Normal work of breathing on room air. General Notes: 02/13/2023: The wound is just the tiniest bit smaller today. There is a little slough on the surface. The tissue around the wound does look a bit purpleish, as though he has been allowed to lean on it, unfortunately. Integumentary (Hair, Skin) Wound #1 status is Open. Original cause of wound was Pressure Injury. The date acquired was: 12/19/2021.  The wound has been in treatment  52 weeks. The wound is located on the Left Elbow. The wound measures 1.5cm length x 1.6cm width x 0.2cm depth; 1.885cm^2 area and 0.377cm^3 volume. There is Fat Layer (Subcutaneous Tissue) exposed. There is no tunneling or undermining noted. There is a medium amount of serosanguineous drainage noted. The wound margin is distinct with the outline attached to the wound base. There is large (67-100%) red, hyper - granulation within the wound bed. There is a small (1-33%) amount of necrotic tissue within the wound bed including Adherent Slough. The periwound skin appearance had no abnormalities noted for texture. The periwound skin appearance had no abnormalities noted for moisture. The periwound skin appearance had no abnormalities noted for color. Periwound temperature was noted as No Abnormality. The periwound has tenderness on palpation. 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 There was a Selective/Open Wound Non-Viable Tissue Debridement with a total area of 1.88 sq cm performed by Duanne Guess, MD. With the following instrument(s): Curette to remove Non-Viable tissue/material. Material removed includes [employer] after achieving pain control using Lidocaine 4% Topical Solution. No specimens were taken. A time out was conducted at 14:29, prior to the start of the procedure. A Minimum amount of bleeding was controlled with Pressure. The procedure was tolerated well. Post Debridement Measurements: 1.5cm length x 1.6cm width x 0.2cm depth; 0.377cm^3 volume. Post debridement Stage noted as Category/Stage III. Character of Wound/Ulcer Post Debridement is improved. Post procedure  Diagnosis Wound #1: Same as Pre-Procedure General Notes: scribed for Dr. Lady Gary by Samuella Bruin, RN. Plan Follow-up Appointments: Return appointment in 1 month. - Dr. Lady Gary - room 2 - 8/6 at 2:15 PM Anesthetic: (In clinic) Topical Lidocaine 4% applied to wound bed Bathing/ Shower/ Hygiene: May shower and wash wound with soap and water. - with dressing changes Off-Loading: Turn and reposition every 2 hours - stay off of left elbow wear prevalon boot on elbow as directed Other: - limit time in wheelchair, reposition in wheelchair every 1-2 hours Home Health: No change in wound care orders this week; continue Home Health for wound care. May utilize formulary equivalent dressing for wound treatment orders unless otherwise specified. - physical therapy once a week, wound care twice a week Other Home Health Orders/Instructions: - [facility] The following medication(s) was prescribed: lidocaine topical 4 % cream cream topical was prescribed at facility WOUND #1: - Elbow Wound Laterality: Left Cleanser: Soap and Water Every Other Day/30 Days Discharge Instructions: May shower and wash wound with dial antibacterial soap and water prior to dressing change. Cleanser: Wound Cleanser (Generic) Every Other Day/30 Days Discharge Instructions: Cleanse the wound with wound cleanser prior to applying a clean dressing using gauze sponges, not tissue or cotton balls. Prim Dressing: Hydrofera Blue Ready Transfer Foam, 2.5x2.5 (in/in) Every Other Day/30 Days ary Discharge Instructions: Apply directly to wound bed as directed Secondary Dressing: ALLEVYN Gentle Border, 5x5 (in/in) (Generic) Every Other Day/30 Days Discharge Instructions: Apply over primary dressing as directed. Add-Ons: Wray [place], 6 (in) (Generic) Every Other Day/30 Days ARMAS, MCBEE ([id number]) [id number] Page 8 of 10 02/13/2023: The wound is just the tiniest bit smaller today. There is a little  slough on the surface. The tissue around the wound  does look a bit purpleish, as though he has been allowed to lean on it, unfortunately. I used a curette to debride the slough from the wound surface. We will continue Hydrofera Blue and a heel cup for padding. His family members are aware of it some of the caregivers that attend to him do not always position him correctly and they will take measures to address this more concretely to avoid ongoing pressure to the wound site. Due to his son's travel schedule, he will not be able to return to clinic until the first week in August, so we will see him then. Electronic Signature(s) Signed: 02/13/2023 2:45:46 PM By: Duanne Guess MD FACS Entered By: Duanne Guess on 02/13/2023 14:45:46 -------------------------------------------------------------------------------- HxROS Details Patient Name: Date of Service: GO RDO Rebbeca Paul V ID M. 02/13/2023 2:15 PM Medical Record Number: [id number] Patient Account Number: [id number] Date of Birth/Sex: Treating RN: [birth year] (89 y.o. M) Primary Care Provider: Nadene Rubins Other Clinician: Referring Provider: Treating Provider/Extender: [name] in Treatment: 52 Information Obtained From Patient Constitutional Symptoms (General Health) Medical History: Past Medical History Notes: obseity Eyes Medical History: Positive for: Cataracts - removed Ear/Nose/Mouth/Throat Medical History: Past Medical History Notes: wears hearing aids Cardiovascular Medical History: Positive for: Arrhythmia - Atrial Flutter/Atrial Fib; Coronary Artery Disease; Hypotension Negative for: Hypertension Past Medical History Notes: Mitral Valve Repair, Genitourinary Medical History: Past Medical History Notes: BPH Musculoskeletal Medical History: Positive for: Osteoarthritis Neurologic Medical History: Negative for: Paraplegia Past Medical History Notes: CVA 2018, left side  hemiparesis HBO Extended History Items JEROME, VIGLIONE ([id number]) [id number] Page 9 of 10 Eyes: Cataracts Immunizations Pneumococcal Vaccine: Received Pneumococcal Vaccination: Yes Received Pneumococcal Vaccination On or After 60th Birthday: Yes Implantable Devices None Hospitalization / Surgery History Type of Hospitalization/Surgery open heart surgery IR generic histoircal appendectomy hernia repair joint replacement mitral valve repair mvp repair total hip arthroplasty Family and Social History Cancer: No; Diabetes: Yes - Maternal Grandparents; Heart Disease: Yes - Mother; Hereditary Spherocytosis: No; Hypertension: Yes - Mother; Kidney Disease: No; Lung Disease: No; Seizures: No; Stroke: No; Thyroid Problems: No; Tuberculosis: No; Never smoker; Marital Status - Married; Alcohol Use: Never; Drug Use: No History; Caffeine Use: Never; Financial Concerns: No; Food, Clothing or Shelter Needs: No; Support System Lacking: No; Transportation Concerns: No Electronic Signature(s) Signed: 02/13/2023 3:41:39 PM By: Duanne Guess MD FACS Entered By: Duanne Guess on 02/13/2023 14:44:11 -------------------------------------------------------------------------------- SuperBill Details Patient Name: Date of Service: [name] ID M. 02/13/2023 Medical Record Number: [id number] Patient Account Number: [id number] Date of Birth/Sex: Treating RN: Dec 11, 1932 (89 y.o. 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 I48.19 Other persistent atrial fibrillation I25.10 Atherosclerotic heart disease of native coronary artery without angina pectoris Facility  Procedures : CPT4 Code: [id number] Description: 97597 - DEBRIDE WOUND 1ST 20 SQ CM OR < ICD-10 Diagnosis Description L89.023 Pressure ulcer of left elbow, stage 3 Modifier: Quantity: 1 Physician Procedures : CPT4 Code Description Modifier [id number] 99213 - WC PHYS LEVEL 3 - EST PT 25 ICD-10 Diagnosis Description L89.023 Pressure ulcer of left elbow, stage 3 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

## 2023-02-13 NOTE — Telephone Encounter (Signed)
[facility] Will [phone]   OT for 2xWx4  Can leave VM

## 2023-02-14 ENCOUNTER — Telehealth: Payer: Self-pay | Admitting: Family Medicine

## 2023-02-14 DIAGNOSIS — I2510 Atherosclerotic heart disease of native coronary artery without angina pectoris: Secondary | ICD-10-CM | POA: Diagnosis not present

## 2023-02-14 DIAGNOSIS — I119 Hypertensive heart disease without heart failure: Secondary | ICD-10-CM | POA: Diagnosis not present

## 2023-02-14 DIAGNOSIS — L89023 Pressure ulcer of left elbow, stage 3: Secondary | ICD-10-CM | POA: Diagnosis not present

## 2023-02-14 DIAGNOSIS — I69354 Hemiplegia and hemiparesis following cerebral infarction affecting left non-dominant side: Secondary | ICD-10-CM | POA: Diagnosis not present

## 2023-02-14 DIAGNOSIS — Z7901 Long term (current) use of anticoagulants: Secondary | ICD-10-CM | POA: Diagnosis not present

## 2023-02-14 DIAGNOSIS — I4819 Other persistent atrial fibrillation: Secondary | ICD-10-CM | POA: Diagnosis not present

## 2023-02-14 NOTE — Telephone Encounter (Signed)
Spoke with Almira Coaster who states that patient seemed to be a little more confused today than normal. Per Almira Coaster she's not sure if this was stress or if patient was tired. I called the home to check with family and the sit in aid that patient has. Per Eunice Blase (in home aid) patient is perfectly fine he may have seemed confused with Almira Coaster do to patient being waken from his sleep. At the time of the call to the home Debbie informed me that the wound nurse was there treating patient  and does not feel like patient is confused today. Advised Debbie to give [place] a call if there are any changes with patient or if they have any concerns. Debbie agrees.

## 2023-02-14 NOTE — Telephone Encounter (Signed)
Gina the speech therapist from [facility] called and said that the Patient seem confuse [phone]. She also want to report that the pt was discharged . Please call gina for more information

## 2023-02-14 NOTE — Telephone Encounter (Signed)
Returned Gina's call no answer LMTCB

## 2023-02-16 DIAGNOSIS — I4819 Other persistent atrial fibrillation: Secondary | ICD-10-CM | POA: Diagnosis not present

## 2023-02-16 DIAGNOSIS — L89023 Pressure ulcer of left elbow, stage 3: Secondary | ICD-10-CM | POA: Diagnosis not present

## 2023-02-16 DIAGNOSIS — I119 Hypertensive heart disease without heart failure: Secondary | ICD-10-CM | POA: Diagnosis not present

## 2023-02-16 DIAGNOSIS — I69354 Hemiplegia and hemiparesis following cerebral infarction affecting left non-dominant side: Secondary | ICD-10-CM | POA: Diagnosis not present

## 2023-02-16 DIAGNOSIS — I2510 Atherosclerotic heart disease of native coronary artery without angina pectoris: Secondary | ICD-10-CM | POA: Diagnosis not present

## 2023-02-16 DIAGNOSIS — Z7901 Long term (current) use of anticoagulants: Secondary | ICD-10-CM | POA: Diagnosis not present

## 2023-02-17 ENCOUNTER — Other Ambulatory Visit: Payer: Self-pay | Admitting: Family Medicine

## 2023-02-17 DIAGNOSIS — K219 Gastro-esophageal reflux disease without esophagitis: Secondary | ICD-10-CM

## 2023-02-19 DIAGNOSIS — I119 Hypertensive heart disease without heart failure: Secondary | ICD-10-CM | POA: Diagnosis not present

## 2023-02-19 DIAGNOSIS — I69354 Hemiplegia and hemiparesis following cerebral infarction affecting left non-dominant side: Secondary | ICD-10-CM | POA: Diagnosis not present

## 2023-02-19 DIAGNOSIS — L89023 Pressure ulcer of left elbow, stage 3: Secondary | ICD-10-CM | POA: Diagnosis not present

## 2023-02-19 DIAGNOSIS — I2510 Atherosclerotic heart disease of native coronary artery without angina pectoris: Secondary | ICD-10-CM | POA: Diagnosis not present

## 2023-02-19 DIAGNOSIS — Z7901 Long term (current) use of anticoagulants: Secondary | ICD-10-CM | POA: Diagnosis not present

## 2023-02-19 DIAGNOSIS — I4819 Other persistent atrial fibrillation: Secondary | ICD-10-CM | POA: Diagnosis not present

## 2023-02-19 NOTE — Progress Notes (Signed)
[name], [name] ([id number]) [id number] Page 1 of 8 Visit Report for 01/16/2023 Chief Complaint Document Details Patient Name: Date of Service: Brian [name] ID M. 01/16/2023 2:00 PM Medical Record Number: [id number] Patient Account Number: [id number] Date of Birth/Sex: Treating RN: 10/06/1932 (89 y.o. M) Primary Care Provider: Nadene Rubins Other Clinician: Referring Provider: Treating Provider/Extender: [name] in Treatment: 48 Information Obtained from: Patient Chief Complaint 08/05/2019; patient is here for review of pressure ulcers on his buttock 02/14/22: patient is here for review of pressure ulcers on his left elbow Electronic Signature(s) Signed: 01/16/2023 2:40:38 PM By: Duanne Guess MD FACS Entered By: Duanne Guess on 01/16/2023 14:40:38 -------------------------------------------------------------------------------- HPI Details Patient Name: Date of Service: Brian RDO Dorris Carnes, DA [name] ID M. 01/16/2023 2:00 PM Medical Record Number: [id number] Patient Account Number: [id number] Date of Birth/Sex: Treating RN: 1933-06-30 (89 y.o. M) Primary Care Provider: Nadene Rubins Other Clinician: Referring Provider: Treating Provider/Extender: [name] in Treatment: 48 History of Present Illness HPI Description: ADMISSION 08/05/19 This is an 89 year old man who arrives in clinic accompanied by his son. 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 Apparently noted recently to have a stage I pressure area on the left buttock. He does not have a wound history. He does have been air fluidized mattress. They have a wheelchair cushion as well as a pillow over the top of this. He is not incontinent of urine or bowel. Past medical history includes hypertension, osteoarthritis of the knee, atrial fibrillation, history of mitral valve repair, right hemisphere CVA with  left hemiparesis, history of a DVT READMISSION 02/14/2022 The patient is here today for evaluation of pressure ulcers on his left elbow and possible right buttock ulcer. Apparently when he was seen by Dr. Leanord Hawking in 2020, no ulcer was appreciated on the buttock and no further wound care involvement occurred. 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 He does have home health aides and they are turning him every 2 hours side to side. He is on a memory foam mattress at this time. 03/14/2022: The patient was scheduled to see me on July 11, but apparently his son panicked about some bright red blood per rectum and rushed him to the emergency room. There is also a comment in the electronic medical record that the patient's son thought the bone was exposed on his elbow. After waiting a prolonged period of time at the emergency department, they left without being seen. Today, it appears that the dressing on the patient's elbow was never changed since our last visit. The 2 wounds have converged creating a larger wound. According to the intake nurse, there was a foul odor from the wound and dressing. There is extensive slough on the wound surface. The bleeding per rectum turns out to be hemorrhoids. The patient's son expresses that he does not know how to manage this. 04/07/2022: For unclear reasons, the patient has not returned to clinic until they were contacted today and they made an add-on visit. The patient's son was not originally present at the beginning of the visit but showed up about 25 minutes into the visit. The patient was accompanied by a home aide. She reported that when she cleaned the patient up today, there was just a Band-Aid on his wound. She said the surface was  fairly mucky but she was able to wash this off. It is abundantly clear that this wound is not being properly  cared for. Apparently he did have [facility] home health but they have not been out in some time and it is not clear the reason for this. The surface of the wound is a bit cleaner so perhaps the Santyl has been applied, but unfortunately there is now a deep tunnel JAXEN, MCATEER ([id number]) [id number] Page 2 of 8 that extends up from the elbow to the patient's posterior upper arm for a number centimeters. There is no evidence of infection or purulent drainage. It is also clear that the patient continues to spend a substantial portion of his time leaning on the elbow. 05/23/2022: Once again, the patient has failed to return to clinic for about 6 weeks and the reason remains unclear. He is apparently receiving home health assistance. The wound on his elbow is covered with hypertrophic granulation tissue. He also has a blister just distal to the wound on his posterior forearm. He has a new stage III pressure ulcer on his left gluteus with slough accumulation. He apparently spends a substantial portion of his days sitting in a wheelchair. I do not believe he has a Museum/gallery conservator. Due to his stroke, he lists to the left side, which is how his ulcer on his elbow developed. I suspect the gluteal ulcer is as a result of this, as well. 06/13/2022: The wound on his left gluteus is nearly healed. It is very superficial and quite clean. His son reports that he has just been applying Desitin to the area. The elbow wound is smaller but still has some hypertrophic granulation tissue and slough accumulation. No concern for infection at either site. 06/27/2022: The buttock wounds are healed. There is some tissue maceration without any significant skin breakdown. The elbow wound continues to contract. There is still some hypertrophic granulation tissue and slough buildup. 07/25/2022: The buttock wounds have reopened. The periwound skin has been kept very dry through liberal use of zinc oxide,  however. The elbow wound dressing did not look like it had been changed in at least a week when it was removed. The underlying wound, however has not reaccumulated any hypertrophic granulation tissue; there is a thin layer of slough present. No malodor or purulent drainage from the wound itself. 08/15/2022: There is a tiny opening remaining on the buttocks. It is clean and there is no evidence of tissue maceration. The elbow looks very good. There is good beefy tissue present and it is flush with the surrounding skin. Home health has been coming out. 09/05/2022: The wound on his bottom is healed. The elbow shows signs of pressure induced deep tissue injury and also has some hypertrophic granulation tissue. 09/26/2022: The elbow looks much better today. There is a little bit of slough on the surface but there is no undermining or tunneling. He does have some hypertrophic granulation tissue accumulation. 10/17/2022: His elbow wound continues to improve. It is smaller, cleaner, and more superficial. There is hypertrophic granulation tissue present. 11/14/2022: His elbow ulcer is smaller again today. There is some slough on the surface. 12/19/2022: The pressure ulcer on his elbow is smaller again today. There is an area that looks like he may have bumped it on something, as it is a little bit bruised and purpleish. He is also requesting an order for physical therapy. 01/16/2023: The pressure ulcer on his elbow looks better than I have ever seen it. It  is smaller and more superficial with very little slough on the surface. Electronic Signature(s) Signed: 01/16/2023 2:41:22 PM By: Duanne Guess MD FACS Entered By: Duanne Guess on 01/16/2023 14:41:22 -------------------------------------------------------------------------------- Physical Exam Details Patient Name: Date of Service: Brian [name] ID M. 01/16/2023 2:00 PM Medical Record Number: [id number] Patient Account Number: [id number] Date of  Birth/Sex: Treating RN: [birth year] (89 y.o. M) Primary Care Provider: Nadene Rubins Other Clinician: Referring Provider: Treating Provider/Extender: [name] in Treatment: 48 Constitutional . . . . no acute distress. Respiratory Normal work of breathing on room air. Notes 01/16/2023: The pressure ulcer on his elbow looks better than I have ever seen it. It is smaller and more superficial with very little slough on the surface. Electronic Signature(s) Signed: 01/16/2023 2:41:49 PM By: Duanne Guess MD FACS Entered By: Duanne Guess on 01/16/2023 14:41:48 Hertzog, Huckleberry M ([id number]) [id number] Page 3 of 8 -------------------------------------------------------------------------------- Physician Orders Details Patient Name: Date of Service: Brian [name] ID M. 01/16/2023 2:00 PM Medical Record Number: [id number] Patient Account Number: [id number] Date of Birth/Sex: Treating RN: [birth year] (89 y.o. [name] Primary Care Provider: Nadene Rubins Other Clinician: Referring Provider: Treating Provider/Extender: [name] in Treatment: [phone] Verbal / Phone Orders: No Diagnosis Coding ICD-10 Coding Code Description L89.023 Pressure ulcer of left elbow, stage 3 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 I48.19 Other persistent atrial fibrillation I25.10 Atherosclerotic heart disease of native coronary artery without angina pectoris Follow-up Appointments Return appointment in 1 month. - Dr. Lady Gary - room 2 Anesthetic (In clinic) Topical Lidocaine 4% applied to wound bed Bathing/ Shower/ Hygiene May shower and wash wound with soap and water. - with dressing changes Off-Loading Turn and reposition every 2 hours - stay off of left elbow wear prevalon boot on elbow as directed Other: - limit time in wheelchair, reposition in wheelchair every 1-2  hours Home Health No change in wound care orders this week; continue Home Health for wound care. May utilize formulary equivalent dressing for wound treatment orders unless otherwise specified. - physical therapy once a week, wound care twice a week Other Home Health Orders/Instructions: - [facility] Wound Treatment Wound #1 - Elbow Wound Laterality: Left Cleanser: Soap and Water Every Other Day/30 Days Discharge Instructions: May shower and wash wound with dial antibacterial soap and water prior to dressing change. Cleanser: Wound Cleanser (Generic) Every Other Day/30 Days Discharge Instructions: Cleanse the wound with wound cleanser prior to applying a clean dressing using gauze sponges, not tissue or cotton balls. Prim Dressing: Hydrofera Blue Ready Transfer Foam, 4x5 (in/in) Every Other Day/30 Days ary Discharge Instructions: Apply to wound bed as instructed Secondary Dressing: ALLEVYN Gentle Border, 5x5 (in/in) (Generic) Every Other Day/30 Days Discharge Instructions: Apply over primary dressing as directed. Add-Ons: Cotton Tip Applicator, 6 (in) (Generic) Every Other Day/30 Days Electronic Signature(s) Signed: 01/16/2023 3:55:17 PM By: Duanne Guess MD FACS Entered By: Duanne Guess on 01/16/2023 14:43:23 -------------------------------------------------------------------------------- Problem List Details Patient Name: Date of Service: Brian Brian [name], DA [name] ID M. 01/16/2023 2:00 PM Medical Record Number: [id number] Patient Account Number: [id number] Date of Birth/Sex: Treating RN: [birth year] (89 y.o. M) Primary Care Provider: Nadene Rubins Other Clinician: Referring Provider: Treating Provider/Extender: Jamor, Ajayi, Terion M ([id number]) [phone].pdf Page 4 of 8 Weeks in Treatment: 48 Active Problems ICD-10 Encounter Code Description Active Date MDM Diagnosis L89.023 Pressure ulcer of left elbow, stage 3 02/14/2022 No  Yes I63.00 Cerebral  infarction due to thrombosis of unspecified precerebral artery 02/14/2022 No Yes I48.19 Other persistent atrial fibrillation 02/14/2022 No Yes I25.10 Atherosclerotic heart disease of native coronary artery without angina pectoris 02/14/2022 No Yes Inactive Problems ICD-10 Code Description Active Date Inactive Date L89.323 Pressure ulcer of left buttock, stage 3 05/23/2022 05/23/2022 Resolved Problems Electronic Signature(s) Signed: 01/16/2023 2:40:24 PM By: Duanne Guess MD FACS Entered By: Duanne Guess on 01/16/2023 14:40:24 -------------------------------------------------------------------------------- Progress Note Details Patient Name: Date of Service: Brian RDO Brian [name] ID M. 01/16/2023 2:00 PM Medical Record Number: [id number] Patient Account Number: [id number] Date of Birth/Sex: Treating RN: 1933-01-09 (89 y.o. M) Primary Care Provider: Nadene Rubins Other Clinician: Referring Provider: Treating Provider/Extender: [name] in Treatment: 48 Subjective Chief Complaint Information obtained from Patient 08/05/2019; patient is here for review of pressure ulcers on his buttock 02/14/22: patient is here for review of pressure ulcers on his left elbow History of Present Illness (HPI) ADMISSION 08/05/19 This is an 89 year old man who arrives in clinic accompanied by his son. 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 Apparently noted recently to have a stage I pressure area on the left buttock. He does not have a wound history. He does have been air fluidized mattress. They have a wheelchair cushion as well as a pillow over the top of this. He is not incontinent of urine or bowel. Past medical history includes hypertension, osteoarthritis of the knee, atrial fibrillation, history of mitral valve repair, right hemisphere CVA with left hemiparesis, history of a DVT READMISSION 02/14/2022 The patient  is here today for evaluation of pressure ulcers on his left elbow and possible right buttock ulcer. Apparently when he was seen by Dr. Leanord Hawking in [place], [place] Judie Petit ([id number]) [id number] Page 5 of 8 2020, no ulcer was appreciated on the buttock and no further wound care involvement occurred. 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 He does have home health aides and they are turning him every 2 hours side to side. He is on a memory foam mattress at this time. 03/14/2022: The patient was scheduled to see me on July 11, but apparently his son panicked about some bright red blood per rectum and rushed him to the emergency room. There is also a comment in the electronic medical record that the patient's son thought the bone was exposed on his elbow. After waiting a prolonged period of time at the emergency department, they left without being seen. Today, it appears that the dressing on the patient's elbow was never changed since our last visit. The 2 wounds have converged creating a larger wound. According to the intake nurse, there was a foul odor from the wound and dressing. There is extensive slough on the wound surface. The bleeding per rectum turns out to be hemorrhoids. The patient's son expresses that he does not know how to manage this. 04/07/2022: For unclear reasons, the patient has not returned to clinic until they were contacted today and they made an add-on visit. The patient's son was not originally present at the beginning of the visit but showed up about 25 minutes into the visit. The patient was accompanied by a home aide. She reported that when she cleaned the patient up today, there was just a Band-Aid on his wound. She said the surface was fairly mucky but she  was able to wash this off. It is abundantly clear that this wound is not being  properly cared for. Apparently he did have [facility] home health but they have not been out in some time and it is not clear the reason for this. The surface of the wound is a bit cleaner so perhaps the Santyl has been applied, but unfortunately there is now a deep tunnel that extends up from the elbow to the patient's posterior upper arm for a number centimeters. There is no evidence of infection or purulent drainage. It is also clear that the patient continues to spend a substantial portion of his time leaning on the elbow. 05/23/2022: Once again, the patient has failed to return to clinic for about 6 weeks and the reason remains unclear. He is apparently receiving home health assistance. The wound on his elbow is covered with hypertrophic granulation tissue. He also has a blister just distal to the wound on his posterior forearm. He has a new stage III pressure ulcer on his left gluteus with slough accumulation. He apparently spends a substantial portion of his days sitting in a wheelchair. I do not believe he has a Museum/gallery conservator. Due to his stroke, he lists to the left side, which is how his ulcer on his elbow developed. I suspect the gluteal ulcer is as a result of this, as well. 06/13/2022: The wound on his left gluteus is nearly healed. It is very superficial and quite clean. His son reports that he has just been applying Desitin to the area. The elbow wound is smaller but still has some hypertrophic granulation tissue and slough accumulation. No concern for infection at either site. 06/27/2022: The buttock wounds are healed. There is some tissue maceration without any significant skin breakdown. The elbow wound continues to contract. There is still some hypertrophic granulation tissue and slough buildup. 07/25/2022: The buttock wounds have reopened. The periwound skin has been kept very dry through liberal use of zinc oxide, however. The elbow wound dressing did not look like it had been changed in  at least a week when it was removed. The underlying wound, however has not reaccumulated any hypertrophic granulation tissue; there is a thin layer of slough present. No malodor or purulent drainage from the wound itself. 08/15/2022: There is a tiny opening remaining on the buttocks. It is clean and there is no evidence of tissue maceration. The elbow looks very good. There is good beefy tissue present and it is flush with the surrounding skin. Home health has been coming out. 09/05/2022: The wound on his bottom is healed. The elbow shows signs of pressure induced deep tissue injury and also has some hypertrophic granulation tissue. 09/26/2022: The elbow looks much better today. There is a little bit of slough on the surface but there is no undermining or tunneling. He does have some hypertrophic granulation tissue accumulation. 10/17/2022: His elbow wound continues to improve. It is smaller, cleaner, and more superficial. There is hypertrophic granulation tissue present. 11/14/2022: His elbow ulcer is smaller again today. There is some slough on the surface. 12/19/2022: The pressure ulcer on his elbow is smaller again today. There is an area that looks like he may have bumped it on something, as it is a little bit bruised and purpleish. He is also requesting an order for physical therapy. 01/16/2023: The pressure ulcer on his elbow looks better than I have ever seen it. It is smaller and more superficial with very little slough on the surface. Patient  History Information obtained from Patient. Family History Diabetes - Maternal Grandparents, Heart Disease - Mother, Hypertension - Mother, No family history of Cancer, Hereditary Spherocytosis, Kidney Disease, Lung Disease, Seizures, Stroke, Thyroid Problems, Tuberculosis. Social History Never smoker, Marital Status - Married, Alcohol Use - Never, Drug Use - No History, Caffeine Use - Never. Medical History Eyes Patient has history of Cataracts -  removed Cardiovascular Patient has history of Arrhythmia - Atrial Flutter/Atrial Fib, Coronary Artery Disease, Hypotension Denies history of Hypertension Musculoskeletal Patient has history of Osteoarthritis Neurologic Denies history of Paraplegia Hospitalization/Surgery History - open heart surgery. - IR generic histoircal. - appendectomy. - hernia repair. - joint replacement. - mitral valve repair. - mvp repair. - total hip arthroplasty. Medical A Surgical History Notes nd Constitutional Symptoms (General Health) obseity Ear/Nose/Mouth/Throat wears hearing aids Cardiovascular Mitral Valve Repair, Genitourinary BPH Neurologic CVA 2018, left side hemiparesis Brian [name], Brian [name] ([id number]) [id number] Page 6 of 8 Objective Constitutional no acute distress. Vitals Time Taken: 1:58 PM, Height: 66 in, Weight: 185 lbs, BMI: 29.9, Temperature: 97.8 F, Pulse: 66 bpm, Respiratory Rate: 18 breaths/min, Blood Pressure: 114/67 mmHg. Respiratory Normal work of breathing on room air. General Notes: 01/16/2023: The pressure ulcer on his elbow looks better than I have ever seen it. It is smaller and more superficial with very little slough on the surface. Integumentary (Hair, Skin) Wound #1 status is Open. Original cause of wound was Pressure Injury. The date acquired was: 12/19/2021. The wound has been in treatment 48 weeks. The wound is located on the Left Elbow. The wound measures 1.6cm length x 1.6cm width x 0.3cm depth; 2.011cm^2 area and 0.603cm^3 volume. There is Fat Layer (Subcutaneous Tissue) exposed. There is no tunneling or undermining noted. There is a medium amount of serosanguineous drainage noted. The wound margin is distinct with the outline attached to the wound base. There is medium (34-66%) red, hyper - granulation within the wound bed. There is a medium (34- 66%) amount of necrotic tissue within the wound bed including Adherent Slough. The periwound  skin appearance had no abnormalities noted for texture. The periwound skin appearance had no abnormalities noted for moisture. The periwound skin appearance had no abnormalities noted for color. Periwound temperature was noted as No Abnormality. The periwound has tenderness on palpation. Assessment Active Problems ICD-10 Pressure ulcer of left elbow, stage 3 Cerebral infarction due to thrombosis of unspecified precerebral artery Other persistent atrial fibrillation Atherosclerotic heart disease of native coronary artery without angina pectoris Plan Follow-up Appointments: Return appointment in 1 month. - Dr. Lady Gary - room 2 Anesthetic: (In clinic) Topical Lidocaine 4% applied to wound bed Bathing/ Shower/ Hygiene: May shower and wash wound with soap and water. - with dressing changes Off-Loading: Turn and reposition every 2 hours - stay off of left elbow wear prevalon boot on elbow as directed Other: - limit time in wheelchair, reposition in wheelchair every 1-2 hours Home Health: No change in wound care orders this week; continue Home Health for wound care. May utilize formulary equivalent dressing for wound treatment orders unless otherwise specified. - physical therapy once a week, wound care twice a week Other Home Health Orders/Instructions: - [id number] WOUND #1: - Elbow Wound Laterality: Left Cleanser: Soap and Water Every Other Day/30 Days Discharge Instructions: May shower and wash wound with dial antibacterial soap and water prior to dressing change. Cleanser: Wound Cleanser (Generic) Every Other Day/30 Days Discharge Instructions: Cleanse the wound with wound cleanser prior to applying a clean dressing using gauze sponges, not  tissue or cotton balls. Prim Dressing: Hydrofera Blue Ready Transfer Foam, 4x5 (in/in) Every Other Day/30 Days ary Discharge Instructions: Apply to wound bed as instructed Secondary Dressing: ALLEVYN Gentle Border, 5x5 (in/in) (Generic) Every Other  Day/30 Days Discharge Instructions: Apply over primary dressing as directed. Add-Ons: Cotton Tip Applicator, 6 (in) (Generic) Every Other Day/30 Days 01/16/2023: The pressure ulcer on his elbow looks better than I have ever seen it. It is smaller and more superficial with very little slough on the surface. What little slough there was wiped off with a piece of gauze, so no debridement was required. We will continue Hydrofera Blue ready and offloading is much as possible, a challenge secondary to his stroke. Follow-up in 1 month. Brian [name], Brian [name] Judie Petit ([id number]) [id number] Page 7 of 8 Electronic Signature(s) Signed: 02/19/2023 2:29:19 PM By: Pearletha Alfred Signed: 02/19/2023 2:55:18 PM By: Duanne Guess MD FACS Previous Signature: 01/16/2023 2:44:41 PM Version By: Duanne Guess MD FACS Entered By: Pearletha Alfred on 02/19/2023 14:29:19 -------------------------------------------------------------------------------- HxROS Details Patient Name: Date of Service: Brian RDO Dorris Carnes, DA [name] ID M. 01/16/2023 2:00 PM Medical Record Number: [id number] Patient Account Number: [id number] Date of Birth/Sex: Treating RN: [birth year] (89 y.o. M) Primary Care Provider: Nadene Rubins Other Clinician: Referring Provider: Treating Provider/Extender: [name] in Treatment: 48 Information Obtained From Patient Constitutional Symptoms (General Health) Medical History: Past Medical History Notes: obseity Eyes Medical History: Positive for: Cataracts - removed Ear/Nose/Mouth/Throat Medical History: Past Medical History Notes: wears hearing aids Cardiovascular Medical History: Positive for: Arrhythmia - Atrial Flutter/Atrial Fib; Coronary Artery Disease; Hypotension Negative for: Hypertension Past Medical History Notes: Mitral Valve Repair, Genitourinary Medical History: Past Medical History Notes: BPH Musculoskeletal Medical History: Positive for:  Osteoarthritis Neurologic Medical History: Negative for: Paraplegia Past Medical History Notes: CVA 2018, left side hemiparesis HBO Extended History Items Eyes: Cataracts Immunizations Pneumococcal Vaccine: Received Pneumococcal Vaccination: Yes Received Pneumococcal Vaccination On or After 60th BirthdaySiler Montemayor, Teena Irani ([id number]) [id number] Page 8 of 8 Implantable Devices None Hospitalization / Surgery History Type of Hospitalization/Surgery open heart surgery IR generic histoircal appendectomy hernia repair joint replacement mitral valve repair mvp repair total hip arthroplasty Family and Social History Cancer: No; Diabetes: Yes - Maternal Grandparents; Heart Disease: Yes - Mother; Hereditary Spherocytosis: No; Hypertension: Yes - Mother; Kidney Disease: No; Lung Disease: No; Seizures: No; Stroke: No; Thyroid Problems: No; Tuberculosis: No; Never smoker; Marital Status - Married; Alcohol Use: Never; Drug Use: No History; Caffeine Use: Never; Financial Concerns: No; Food, Clothing or Shelter Needs: No; Support System Lacking: No; Transportation Concerns: No Electronic Signature(s) Signed: 01/16/2023 3:55:17 PM By: Duanne Guess MD FACS Entered By: Duanne Guess on 01/16/2023 14:41:29 -------------------------------------------------------------------------------- SuperBill Details Patient Name: Date of Service: Brian [name] ID M. 01/16/2023 Medical Record Number: [id number] Patient Account Number: [id number] Date of Birth/Sex: Treating RN: 10-19-1932 (89 y.o. 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 I48.19 Other persistent  atrial fibrillation I25.10 Atherosclerotic heart disease of native coronary artery without angina pectoris Facility Procedures : CPT4 Code: 41324401 Description: 99213 - WOUND CARE VISIT-LEV 3 EST PT Modifier: Quantity: 1 Physician Procedures : CPT4 Code Description Modifier [id number] 99213 - WC PHYS LEVEL 3 - EST PT ICD-10 Diagnosis Description L89.023 Pressure ulcer of left elbow, stage 3  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

## 2023-02-20 DIAGNOSIS — I4819 Other persistent atrial fibrillation: Secondary | ICD-10-CM | POA: Diagnosis not present

## 2023-02-20 DIAGNOSIS — I2510 Atherosclerotic heart disease of native coronary artery without angina pectoris: Secondary | ICD-10-CM | POA: Diagnosis not present

## 2023-02-20 DIAGNOSIS — I119 Hypertensive heart disease without heart failure: Secondary | ICD-10-CM | POA: Diagnosis not present

## 2023-02-20 DIAGNOSIS — I69354 Hemiplegia and hemiparesis following cerebral infarction affecting left non-dominant side: Secondary | ICD-10-CM | POA: Diagnosis not present

## 2023-02-20 DIAGNOSIS — Z7901 Long term (current) use of anticoagulants: Secondary | ICD-10-CM | POA: Diagnosis not present

## 2023-02-20 DIAGNOSIS — L89023 Pressure ulcer of left elbow, stage 3: Secondary | ICD-10-CM | POA: Diagnosis not present

## 2023-02-21 DIAGNOSIS — Z79891 Long term (current) use of opiate analgesic: Secondary | ICD-10-CM | POA: Diagnosis not present

## 2023-02-21 DIAGNOSIS — Z86718 Personal history of other venous thrombosis and embolism: Secondary | ICD-10-CM | POA: Diagnosis not present

## 2023-02-21 DIAGNOSIS — L89023 Pressure ulcer of left elbow, stage 3: Secondary | ICD-10-CM | POA: Diagnosis not present

## 2023-02-21 DIAGNOSIS — M179 Osteoarthritis of knee, unspecified: Secondary | ICD-10-CM | POA: Diagnosis not present

## 2023-02-21 DIAGNOSIS — Z952 Presence of prosthetic heart valve: Secondary | ICD-10-CM | POA: Diagnosis not present

## 2023-02-21 DIAGNOSIS — I69354 Hemiplegia and hemiparesis following cerebral infarction affecting left non-dominant side: Secondary | ICD-10-CM | POA: Diagnosis not present

## 2023-02-21 DIAGNOSIS — Z7901 Long term (current) use of anticoagulants: Secondary | ICD-10-CM | POA: Diagnosis not present

## 2023-02-21 DIAGNOSIS — I119 Hypertensive heart disease without heart failure: Secondary | ICD-10-CM | POA: Diagnosis not present

## 2023-02-21 DIAGNOSIS — I4819 Other persistent atrial fibrillation: Secondary | ICD-10-CM | POA: Diagnosis not present

## 2023-02-21 DIAGNOSIS — I2510 Atherosclerotic heart disease of native coronary artery without angina pectoris: Secondary | ICD-10-CM | POA: Diagnosis not present

## 2023-02-23 DIAGNOSIS — I2510 Atherosclerotic heart disease of native coronary artery without angina pectoris: Secondary | ICD-10-CM | POA: Diagnosis not present

## 2023-02-23 DIAGNOSIS — L89023 Pressure ulcer of left elbow, stage 3: Secondary | ICD-10-CM | POA: Diagnosis not present

## 2023-02-23 DIAGNOSIS — Z7901 Long term (current) use of anticoagulants: Secondary | ICD-10-CM | POA: Diagnosis not present

## 2023-02-23 DIAGNOSIS — I119 Hypertensive heart disease without heart failure: Secondary | ICD-10-CM | POA: Diagnosis not present

## 2023-02-23 DIAGNOSIS — I4819 Other persistent atrial fibrillation: Secondary | ICD-10-CM | POA: Diagnosis not present

## 2023-02-23 DIAGNOSIS — I69354 Hemiplegia and hemiparesis following cerebral infarction affecting left non-dominant side: Secondary | ICD-10-CM | POA: Diagnosis not present

## 2023-02-27 DIAGNOSIS — I4819 Other persistent atrial fibrillation: Secondary | ICD-10-CM | POA: Diagnosis not present

## 2023-02-27 DIAGNOSIS — L89023 Pressure ulcer of left elbow, stage 3: Secondary | ICD-10-CM | POA: Diagnosis not present

## 2023-02-27 DIAGNOSIS — Z7901 Long term (current) use of anticoagulants: Secondary | ICD-10-CM | POA: Diagnosis not present

## 2023-02-27 DIAGNOSIS — I2510 Atherosclerotic heart disease of native coronary artery without angina pectoris: Secondary | ICD-10-CM | POA: Diagnosis not present

## 2023-02-27 DIAGNOSIS — I69354 Hemiplegia and hemiparesis following cerebral infarction affecting left non-dominant side: Secondary | ICD-10-CM | POA: Diagnosis not present

## 2023-02-27 DIAGNOSIS — I119 Hypertensive heart disease without heart failure: Secondary | ICD-10-CM | POA: Diagnosis not present

## 2023-02-28 DIAGNOSIS — L89023 Pressure ulcer of left elbow, stage 3: Secondary | ICD-10-CM | POA: Diagnosis not present

## 2023-02-28 DIAGNOSIS — I119 Hypertensive heart disease without heart failure: Secondary | ICD-10-CM | POA: Diagnosis not present

## 2023-02-28 DIAGNOSIS — Z7901 Long term (current) use of anticoagulants: Secondary | ICD-10-CM | POA: Diagnosis not present

## 2023-02-28 DIAGNOSIS — I69354 Hemiplegia and hemiparesis following cerebral infarction affecting left non-dominant side: Secondary | ICD-10-CM | POA: Diagnosis not present

## 2023-02-28 DIAGNOSIS — I2510 Atherosclerotic heart disease of native coronary artery without angina pectoris: Secondary | ICD-10-CM | POA: Diagnosis not present

## 2023-02-28 DIAGNOSIS — I4819 Other persistent atrial fibrillation: Secondary | ICD-10-CM | POA: Diagnosis not present

## 2023-03-01 ENCOUNTER — Telehealth: Payer: Self-pay | Admitting: Family Medicine

## 2023-03-01 ENCOUNTER — Other Ambulatory Visit: Payer: Self-pay | Admitting: Family Medicine

## 2023-03-01 DIAGNOSIS — K219 Gastro-esophageal reflux disease without esophagitis: Secondary | ICD-10-CM

## 2023-03-01 NOTE — Telephone Encounter (Signed)
Pts son has requested a refill on pantoprazole (PROTONIX) 40 MG tablet [[id number]] he has 4 pills left. Please send to [facility] in [place]

## 2023-03-01 NOTE — Telephone Encounter (Signed)
This medication was already refilled today. Patient will need to call the pharmacy to see when it will be ready.

## 2023-03-02 DIAGNOSIS — I69354 Hemiplegia and hemiparesis following cerebral infarction affecting left non-dominant side: Secondary | ICD-10-CM | POA: Diagnosis not present

## 2023-03-02 DIAGNOSIS — L89023 Pressure ulcer of left elbow, stage 3: Secondary | ICD-10-CM | POA: Diagnosis not present

## 2023-03-02 DIAGNOSIS — I2510 Atherosclerotic heart disease of native coronary artery without angina pectoris: Secondary | ICD-10-CM | POA: Diagnosis not present

## 2023-03-02 DIAGNOSIS — I119 Hypertensive heart disease without heart failure: Secondary | ICD-10-CM | POA: Diagnosis not present

## 2023-03-02 DIAGNOSIS — I4819 Other persistent atrial fibrillation: Secondary | ICD-10-CM | POA: Diagnosis not present

## 2023-03-02 DIAGNOSIS — Z7901 Long term (current) use of anticoagulants: Secondary | ICD-10-CM | POA: Diagnosis not present

## 2023-03-06 ENCOUNTER — Telehealth: Payer: Self-pay | Admitting: Family Medicine

## 2023-03-06 ENCOUNTER — Telehealth: Payer: Self-pay | Admitting: Student in an Organized Health Care Education/Training Program

## 2023-03-06 NOTE — Telephone Encounter (Signed)
Hospice    verbal orders Caller Name:[facility] Agency Name: [facility]  Callback number: [phone], vm secure  PJ is needing verbal orders for pt to have a referral for Hospice Care and does Dr Doreene Burke want to be the attending provider? Does Dr Doreene Burke want to manage pt's symptoms or the attending at the Hospice center?   Please forward to [facility] pool or providers CMA

## 2023-03-06 NOTE — Telephone Encounter (Signed)
Will complete PA

## 2023-03-06 NOTE — Telephone Encounter (Signed)
Patient son call stated that pharmacy needs a PA for oxycodone. Please give patient a call. TY

## 2023-03-06 NOTE — Telephone Encounter (Signed)
Prescription Request  03/06/2023  LOV: Visit date not found  What is the name of the medication or equipment? oxyCODONE-acetaminophen (PERCOCET) 7.5-325 MG tablet [[id number]]    Have you contacted your pharmacy to request a refill? Yes, I let him know Dr Doreene Burke did not fill this and Dr Cherylann Ratel filled it last.   Which pharmacy would you like this sent to?  [id number] [name], [name] - [address] AT [facility] OF [address], [place] [address] Phone: [phone]  Fax: [phone] DEA #: [id number]        Patient notified that their request is being sent to the clinical staff for review and that they should receive a response within 2 business days.   Please advise at [facility] [phone]

## 2023-03-07 ENCOUNTER — Telehealth: Payer: Self-pay

## 2023-03-07 ENCOUNTER — Telehealth: Payer: Self-pay | Admitting: Family Medicine

## 2023-03-07 DIAGNOSIS — I4819 Other persistent atrial fibrillation: Secondary | ICD-10-CM | POA: Diagnosis not present

## 2023-03-07 DIAGNOSIS — I119 Hypertensive heart disease without heart failure: Secondary | ICD-10-CM | POA: Diagnosis not present

## 2023-03-07 DIAGNOSIS — L89023 Pressure ulcer of left elbow, stage 3: Secondary | ICD-10-CM | POA: Diagnosis not present

## 2023-03-07 DIAGNOSIS — I69354 Hemiplegia and hemiparesis following cerebral infarction affecting left non-dominant side: Secondary | ICD-10-CM | POA: Diagnosis not present

## 2023-03-07 DIAGNOSIS — I2510 Atherosclerotic heart disease of native coronary artery without angina pectoris: Secondary | ICD-10-CM | POA: Diagnosis not present

## 2023-03-07 DIAGNOSIS — Z7901 Long term (current) use of anticoagulants: Secondary | ICD-10-CM | POA: Diagnosis not present

## 2023-03-07 NOTE — Telephone Encounter (Signed)
[facility] [phone]   They are requesting an okay to change from Palliative care to Hospice. Please call with approval.

## 2023-03-07 NOTE — Telephone Encounter (Signed)
[facility] called in stating she had requested pt to call EMS, he decline. He is having SOB and chest tightness since 03/05/23. I transferred to nurse triage.

## 2023-03-07 NOTE — Telephone Encounter (Signed)
Son called for a PA for Percocet.  No medication insurance card found.  Called patient to get him to read the numbers to me and he said his son would call me back with those numbers.

## 2023-03-09 DIAGNOSIS — Z7901 Long term (current) use of anticoagulants: Secondary | ICD-10-CM | POA: Diagnosis not present

## 2023-03-09 DIAGNOSIS — L89023 Pressure ulcer of left elbow, stage 3: Secondary | ICD-10-CM | POA: Diagnosis not present

## 2023-03-09 DIAGNOSIS — I2510 Atherosclerotic heart disease of native coronary artery without angina pectoris: Secondary | ICD-10-CM | POA: Diagnosis not present

## 2023-03-09 DIAGNOSIS — I69354 Hemiplegia and hemiparesis following cerebral infarction affecting left non-dominant side: Secondary | ICD-10-CM | POA: Diagnosis not present

## 2023-03-09 DIAGNOSIS — I119 Hypertensive heart disease without heart failure: Secondary | ICD-10-CM | POA: Diagnosis not present

## 2023-03-09 DIAGNOSIS — I4819 Other persistent atrial fibrillation: Secondary | ICD-10-CM | POA: Diagnosis not present

## 2023-03-13 DIAGNOSIS — I4819 Other persistent atrial fibrillation: Secondary | ICD-10-CM | POA: Diagnosis not present

## 2023-03-13 DIAGNOSIS — L89023 Pressure ulcer of left elbow, stage 3: Secondary | ICD-10-CM | POA: Diagnosis not present

## 2023-03-13 DIAGNOSIS — I2510 Atherosclerotic heart disease of native coronary artery without angina pectoris: Secondary | ICD-10-CM | POA: Diagnosis not present

## 2023-03-13 DIAGNOSIS — I69354 Hemiplegia and hemiparesis following cerebral infarction affecting left non-dominant side: Secondary | ICD-10-CM | POA: Diagnosis not present

## 2023-03-13 DIAGNOSIS — Z7901 Long term (current) use of anticoagulants: Secondary | ICD-10-CM | POA: Diagnosis not present

## 2023-03-13 DIAGNOSIS — I119 Hypertensive heart disease without heart failure: Secondary | ICD-10-CM | POA: Diagnosis not present

## 2023-03-14 DIAGNOSIS — I2510 Atherosclerotic heart disease of native coronary artery without angina pectoris: Secondary | ICD-10-CM | POA: Diagnosis not present

## 2023-03-14 DIAGNOSIS — L89023 Pressure ulcer of left elbow, stage 3: Secondary | ICD-10-CM | POA: Diagnosis not present

## 2023-03-14 DIAGNOSIS — I69354 Hemiplegia and hemiparesis following cerebral infarction affecting left non-dominant side: Secondary | ICD-10-CM | POA: Diagnosis not present

## 2023-03-14 DIAGNOSIS — I4819 Other persistent atrial fibrillation: Secondary | ICD-10-CM | POA: Diagnosis not present

## 2023-03-14 DIAGNOSIS — Z7901 Long term (current) use of anticoagulants: Secondary | ICD-10-CM | POA: Diagnosis not present

## 2023-03-14 DIAGNOSIS — I119 Hypertensive heart disease without heart failure: Secondary | ICD-10-CM | POA: Diagnosis not present

## 2023-03-16 DIAGNOSIS — I2510 Atherosclerotic heart disease of native coronary artery without angina pectoris: Secondary | ICD-10-CM | POA: Diagnosis not present

## 2023-03-16 DIAGNOSIS — Z7901 Long term (current) use of anticoagulants: Secondary | ICD-10-CM | POA: Diagnosis not present

## 2023-03-16 DIAGNOSIS — L89023 Pressure ulcer of left elbow, stage 3: Secondary | ICD-10-CM | POA: Diagnosis not present

## 2023-03-16 DIAGNOSIS — I119 Hypertensive heart disease without heart failure: Secondary | ICD-10-CM | POA: Diagnosis not present

## 2023-03-16 DIAGNOSIS — I69354 Hemiplegia and hemiparesis following cerebral infarction affecting left non-dominant side: Secondary | ICD-10-CM | POA: Diagnosis not present

## 2023-03-16 DIAGNOSIS — I4819 Other persistent atrial fibrillation: Secondary | ICD-10-CM | POA: Diagnosis not present

## 2023-03-18 ENCOUNTER — Other Ambulatory Visit: Payer: Self-pay | Admitting: Family Medicine

## 2023-03-18 DIAGNOSIS — I6300 Cerebral infarction due to thrombosis of unspecified precerebral artery: Secondary | ICD-10-CM

## 2023-03-18 DIAGNOSIS — I480 Paroxysmal atrial fibrillation: Secondary | ICD-10-CM

## 2023-03-20 ENCOUNTER — Encounter (HOSPITAL_BASED_OUTPATIENT_CLINIC_OR_DEPARTMENT_OTHER): Payer: Medicare Other | Attending: General Surgery | Admitting: General Surgery

## 2023-03-20 DIAGNOSIS — L89023 Pressure ulcer of left elbow, stage 3: Secondary | ICD-10-CM | POA: Insufficient documentation

## 2023-03-20 DIAGNOSIS — I4819 Other persistent atrial fibrillation: Secondary | ICD-10-CM | POA: Diagnosis not present

## 2023-03-20 DIAGNOSIS — I69354 Hemiplegia and hemiparesis following cerebral infarction affecting left non-dominant side: Secondary | ICD-10-CM | POA: Diagnosis not present

## 2023-03-20 DIAGNOSIS — Z86718 Personal history of other venous thrombosis and embolism: Secondary | ICD-10-CM | POA: Diagnosis not present

## 2023-03-20 DIAGNOSIS — I2510 Atherosclerotic heart disease of native coronary artery without angina pectoris: Secondary | ICD-10-CM | POA: Insufficient documentation

## 2023-03-20 DIAGNOSIS — I10 Essential (primary) hypertension: Secondary | ICD-10-CM | POA: Diagnosis not present

## 2023-03-20 NOTE — Progress Notes (Signed)
NOHL, TRZCINSKI ([id number]) [id number] Page 1 of 10 Visit Report for 03/20/2023 Chief Complaint Document Details Patient Name: Date of Service: Brian [name] ID M. 03/20/2023 2:15 PM Medical Record Number: [id number] Patient Account Number: [id number] Date of Birth/Sex: Treating RN: 07-Jul-1933 (89 y.o. M) Primary Care Provider: Nadene Rubins Other Clinician: Referring Provider: Treating Provider/Extender: [name] in Treatment: 73 Information Obtained from: Patient Chief Complaint 08/05/2019; patient is here for review of pressure ulcers on his buttock 02/14/22: patient is here for review of pressure ulcers on his left elbow Electronic Signature(s) Signed: 03/20/2023 3:11:44 PM By: Duanne Guess MD FACS Entered By: Duanne Guess on 03/20/2023 15:11:44 -------------------------------------------------------------------------------- Debridement Details Patient Name: Date of Service: Brian [name] ID M. 03/20/2023 2:15 PM Medical Record Number: [id number] Patient Account Number: [id number] Date of Birth/Sex: Treating RN: 1933/03/05 (89 y.o. [name] Primary Care Provider: Nadene Rubins Other Clinician: Referring Provider: Treating Provider/Extender: [name] in Treatment: 57 Debridement Performed for Assessment: Wound #1 Left Elbow Performed By: Physician Duanne Guess, MD Debridement Type: Debridement Level of Consciousness (Pre-procedure): Awake and Alert Pre-procedure Verification/Time Out Yes - 14:37 Taken: Start Time: 14:37 Pain Control: Lidocaine 4% T opical Solution Percent of Wound Bed Debrided: 100% T Area Debrided (cm): otal 1.43 Tissue and other material debrided: Non-Viable, Slough, Slough Level: Non-Viable Tissue Debridement Description: Selective/Open Wound Instrument: Curette Bleeding: Minimum Hemostasis Achieved: Pressure Response to Treatment:  Procedure was tolerated well Level of Consciousness (Post- Awake and Alert procedure): Post Debridement Measurements of Total Wound Length: (cm) 1.3 Stage: Category/Stage III Width: (cm) 1.4 Depth: (cm) 0.2 Volume: (cm) 0.286 Character of Wound/Ulcer Post Debridement: Improved Post Procedure Diagnosis Brian [name] ([id number]) [id number] Page 2 of 10 Same as Pre-procedure Notes scribed for Dr. Lady Gary by Samuella Bruin, RN Electronic Signature(s) Signed: 03/20/2023 3:21:59 PM By: Duanne Guess MD FACS Signed: 03/20/2023 4:02:14 PM By: Gelene Mink By: Samuella Bruin on 03/20/2023 14:38:59 -------------------------------------------------------------------------------- HPI Details Patient Name: Date of Service: Brian [name] ID M. 03/20/2023 2:15 PM Medical Record Number: [id number] Patient Account Number: [id number] Date of Birth/Sex: Treating RN: 06-Jun-1933 (89 y.o. M) Primary Care Provider: Nadene Rubins Other Clinician: Referring Provider: Treating Provider/Extender: [name] in Treatment: 64 History of Present Illness HPI Description: ADMISSION 08/05/19 This is an 89 year old man who arrives in clinic accompanied by his son. 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 Apparently noted recently to have a stage I pressure area on the left buttock. He does not have a wound history. He does have been air fluidized mattress. They have a wheelchair cushion as well as a pillow over the top of this. He is not incontinent of urine or bowel. Past medical history includes hypertension, osteoarthritis of the knee, atrial fibrillation, history of mitral valve repair, right hemisphere CVA with left hemiparesis, history of a DVT READMISSION 02/14/2022 The patient is here today for evaluation of pressure ulcers on his left elbow and possible right buttock ulcer.  Apparently when he was seen by Dr. Leanord Hawking in 2020, no ulcer was appreciated on the buttock and no further wound care involvement occurred. 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 He does have home health aides and they are turning him every 2 hours side to side. He  is on a memory foam mattress at this time. 03/14/2022: The patient was scheduled to see me on July 11, but apparently his son panicked about some bright red blood per rectum and rushed him to the emergency room. There is also a comment in the electronic medical record that the patient's son thought the bone was exposed on his elbow. After waiting a prolonged period of time at the emergency department, they left without being seen. Today, it appears that the dressing on the patient's elbow was never changed since our last visit. The 2 wounds have converged creating a larger wound. According to the intake nurse, there was a foul odor from the wound and dressing. There is extensive slough on the wound surface. The bleeding per rectum turns out to be hemorrhoids. The patient's son expresses that he does not know how to manage this. 04/07/2022: For unclear reasons, the patient has not returned to clinic until they were contacted today and they made an add-on visit. The patient's son was not originally present at the beginning of the visit but showed up about 25 minutes into the visit. The patient was accompanied by a home aide. She reported that when she cleaned the patient up today, there was just a Band-Aid on his wound. She said the surface was fairly mucky but she was able to wash this off. It is abundantly clear that this wound is not being properly cared for. Apparently he did have [facility] home health but they have not been out in some time and it is not clear the reason for this. The surface of the wound is a bit  cleaner so perhaps the Santyl has been applied, but unfortunately there is now a deep tunnel that extends up from the elbow to the patient's posterior upper arm for a number centimeters. There is no evidence of infection or purulent drainage. It is also clear that the patient continues to spend a substantial portion of his time leaning on the elbow. 05/23/2022: Once again, the patient has failed to return to clinic for about 6 weeks and the reason remains unclear. He is apparently receiving home health assistance. The wound on his elbow is covered with hypertrophic granulation tissue. He also has a blister just distal to the wound on his posterior forearm. He has a new stage III pressure ulcer on his left gluteus with slough accumulation. He apparently spends a substantial portion of his days sitting in a wheelchair. I do not believe he has a Museum/gallery conservator. Due to his stroke, he lists to the left side, which is how his ulcer on his elbow developed. I suspect the gluteal ulcer is as a result of this, as well. 06/13/2022: The wound on his left gluteus is nearly healed. It is very superficial and quite clean. His son reports that he has just been applying Desitin to the area. The elbow wound is smaller but still has some hypertrophic granulation tissue and slough accumulation. No concern for infection at either site. 06/27/2022: The buttock wounds are healed. There is some tissue maceration without any significant skin breakdown. The elbow wound continues to contract. There is still some hypertrophic granulation tissue and slough buildup. 07/25/2022: The buttock wounds have reopened. The periwound skin has been kept very dry through liberal use of zinc oxide, however. The elbow wound dressing did not look like it had been changed in at least a week when it was removed. The underlying wound, however has not reaccumulated any hypertrophic granulation tissue; there  is a thin layer of slough present. No malodor  or purulent drainage from the wound itself. 08/15/2022: There is a tiny opening remaining on the buttocks. It is clean and there is no evidence of tissue maceration. The elbow looks very good. There is good beefy tissue present and it is flush with the surrounding skin. Home health has been coming out. 09/05/2022: The wound on his bottom is healed. The elbow shows signs of pressure induced deep tissue injury and also has some hypertrophic granulation tissue. Brian [name], Brian [name] ([id number]) [id number] Page 3 of 10 09/26/2022: The elbow looks much better today. There is a little bit of slough on the surface but there is no undermining or tunneling. He does have some hypertrophic granulation tissue accumulation. 10/17/2022: His elbow wound continues to improve. It is smaller, cleaner, and more superficial. There is hypertrophic granulation tissue present. 11/14/2022: His elbow ulcer is smaller again today. There is some slough on the surface. 12/19/2022: The pressure ulcer on his elbow is smaller again today. There is an area that looks like he may have bumped it on something, as it is a little bit bruised and purpleish. He is also requesting an order for physical therapy. 01/16/2023: The pressure ulcer on his elbow looks better than I have ever seen it. It is smaller and more superficial with very little slough on the surface. 02/13/2023: The wound is just the tiniest bit smaller today. There is a little slough on the surface. The tissue around the wound does look a bit purpleish, as though he has been allowed to lean on it, unfortunately. 03/20/2023: The wound measured smaller today. There is less evidence of pressure on the site compared to last month. There is some slough on the surface and the underlying granulation tissue is a bit hypertrophic. Electronic Signature(s) Signed: 03/20/2023 3:17:01 PM By: Duanne Guess MD FACS Entered By: Duanne Guess on 03/20/2023  15:17:01 -------------------------------------------------------------------------------- Physical Exam Details Patient Name: Date of Service: Brian [name] ID M. 03/20/2023 2:15 PM Medical Record Number: [id number] Patient Account Number: [id number] Date of Birth/Sex: Treating RN: June 01, 1933 (89 y.o. M) Primary Care Provider: Nadene Rubins Other Clinician: Referring Provider: Treating Provider/Extender: [name] in Treatment: 57 Constitutional . . . . no acute distress. Respiratory Normal work of breathing on room air. Notes 03/20/2023: The wound measured smaller today. There is less evidence of pressure on the site compared to last month. There is some slough on the surface and the underlying granulation tissue is a bit hypertrophic. Electronic Signature(s) Signed: 03/20/2023 3:18:16 PM By: Duanne Guess MD FACS Entered By: Duanne Guess on 03/20/2023 15:18:16 -------------------------------------------------------------------------------- Physician Orders Details Patient Name: Date of Service: Brian [name] ID M. 03/20/2023 2:15 PM Medical Record Number: [id number] Patient Account Number: [id number] Date of Birth/Sex: Treating RN: [birth year] (89 y.o. [name] Primary Care Provider: Nadene Rubins Other Clinician: Referring Provider: Treating Provider/Extender: [name] in Treatment: 60 Verbal / Phone Orders: No Diagnosis Coding ICD-10 Coding Code Description L89.023 Pressure ulcer of left elbow, stage 3 Brian [name], Brian [name] ([id number]) [id number] Page 4 of 10 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 I48.19 Other persistent atrial fibrillation I25.10 Atherosclerotic heart disease of native coronary artery without angina pectoris Follow-up Appointments Return appointment in 1 month. - Dr. Lady Gary - room 2 - 8/27 at 2:15 Anesthetic (In  clinic) Topical Lidocaine 4% applied to wound bed Bathing/ Shower/ Hygiene May shower and wash wound  with soap and water. - with dressing changes Off-Loading Turn and reposition every 2 hours - stay off of left elbow wear prevalon boot on elbow as directed Other: - limit time in wheelchair, reposition in wheelchair every 1-2 hours Home Health No change in wound care orders this week; continue Home Health for wound care. May utilize formulary equivalent dressing for wound treatment orders unless otherwise specified. - physical therapy once a week, wound care twice a week Other Home Health Orders/Instructions: - ZOXWR[facility] Wound Treatment Wound #1 - Elbow Wound Laterality: Left Cleanser: Soap and Water Every Other Day/30 Days Discharge Instructions: May shower and wash wound with dial antibacterial soap and water prior to dressing change. Cleanser: Wound Cleanser (Generic) Every Other Day/30 Days Discharge Instructions: Cleanse the wound with wound cleanser prior to applying a clean dressing using gauze sponges, not tissue or cotton balls. Prim Dressing: Hydrofera Blue Ready Transfer Foam, 2.5x2.5 (in/in) Every Other Day/30 Days ary Discharge Instructions: Apply directly to wound bed as directed Secondary Dressing: ALLEVYN Gentle Border, 5x5 (in/in) (Generic) Every Other Day/30 Days Discharge Instructions: Apply over primary dressing as directed. Add-Ons: Cotton Tip Applicator, 6 (in) (Generic) Every Other Day/30 Days Patient Medications llergies: penicillin, Novocain A Notifications Medication Indication Start End 03/20/2023 lidocaine DOSE topical 4 % cream - cream topical Electronic Signature(s) Signed: 03/20/2023 3:21:59 PM By: Duanne Guess MD FACS Entered By: Duanne Guess on 03/20/2023 15:18:33 -------------------------------------------------------------------------------- Problem List Details Patient Name: Date of Service: Brian [name] ID M. 03/20/2023 2:15 PM Medical  Record Number: [id number] Patient Account Number: [id number] Date of Birth/Sex: Treating RN: October 25, 1932 (89 y.o. M) Primary Care Provider: Nadene Rubins Other Clinician: Referring Provider: Treating Provider/Extender: [name] in Treatment: 14 Active Problems ICD-10 Encounter Code Description Active Date MDM Brian [name], Brian [name] ([id number]) [id number] Page 5 of 10 Code Description Active Date MDM Diagnosis L89.023 Pressure ulcer of left elbow, stage 3 02/14/2022 No Yes 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 02/14/2022 No Yes I48.19 Other persistent atrial fibrillation 02/14/2022 No Yes I25.10 Atherosclerotic heart disease of native coronary artery without angina pectoris 02/14/2022 No Yes Inactive Problems ICD-10 Code Description Active Date Inactive Date L89.323 Pressure ulcer of left buttock, stage 3 05/23/2022 05/23/2022 Resolved Problems Electronic Signature(s) Signed: 03/20/2023 3:11:06 PM By: Duanne Guess MD FACS Entered By: Duanne Guess on 03/20/2023 15:11:06 -------------------------------------------------------------------------------- Progress Note Details Patient Name: Date of Service: Brian [name] ID M. 03/20/2023 2:15 PM Medical Record Number: [id number] Patient Account Number: [id number] Date of Birth/Sex: Treating RN: [birth year] (89 y.o. M) Primary Care Provider: Nadene Rubins Other Clinician: Referring Provider: Treating Provider/Extender: [name] in Treatment: 26 Subjective Chief Complaint Information obtained from Patient 08/05/2019; patient is here for review of pressure ulcers on his buttock 02/14/22: patient is here for review of pressure ulcers on his left elbow History of Present Illness (HPI) ADMISSION 08/05/19 This is an 89 year old man who arrives in clinic accompanied by his son. Very disabled secondary to a right  hemisphere CVA with left hemiparesis in 2017. Apparently noted recently to have a stage I pressure area on the left buttock. He does not have a wound history. He does have been air fluidized mattress. They have a wheelchair cushion as well as a pillow over the top of this. He is not incontinent of urine or bowel. Past medical history includes hypertension, osteoarthritis of the knee, atrial fibrillation, history of mitral valve repair, right hemisphere CVA with left  hemiparesis, history of a DVT READMISSION 02/14/2022 The patient is here today for evaluation of pressure ulcers on his left elbow and possible right buttock ulcer. Apparently when he was seen by Dr. Leanord Hawking in 2020, no ulcer was appreciated on the buttock and no further wound care involvement occurred. 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 He does have home health aides and they are turning him every 2 hours side to side. He is on a memory foam mattress at this time. 03/14/2022: The patient was scheduled to see me on July 11, but apparently his son panicked about some bright red blood per rectum and rushed him to the emergency room. There is also a comment in the electronic medical record that the patient's son thought the bone was exposed on his elbow. After waiting a prolonged period of time at the emergency department, they left without being seen. Today, it appears that the dressing on the patient's elbow was never changed since our last visit. The 2 wounds have converged creating a larger wound. According to the intake nurse, there was a foul odor from the wound and dressing. There is extensive slough on the wound surface. The bleeding per rectum turns out to be hemorrhoids. The patient's son expresses that he does not Brian [name], Brian [name] ([id number]) [id number] Page 6 of 10 know how  to manage this. 04/07/2022: For unclear reasons, the patient has not returned to clinic until they were contacted today and they made an add-on visit. The patient's son was not originally present at the beginning of the visit but showed up about 25 minutes into the visit. The patient was accompanied by a home aide. She reported that when she cleaned the patient up today, there was just a Band-Aid on his wound. She said the surface was fairly mucky but she was able to wash this off. It is abundantly clear that this wound is not being properly cared for. Apparently he did have [facility] home health but they have not been out in some time and it is not clear the reason for this. The surface of the wound is a bit cleaner so perhaps the Santyl has been applied, but unfortunately there is now a deep tunnel that extends up from the elbow to the patient's posterior upper arm for a number centimeters. There is no evidence of infection or purulent drainage. It is also clear that the patient continues to spend a substantial portion of his time leaning on the elbow. 05/23/2022: Once again, the patient has failed to return to clinic for about 6 weeks and the reason remains unclear. He is apparently receiving home health assistance. The wound on his elbow is covered with hypertrophic granulation tissue. He also has a blister just distal to the wound on his posterior forearm. He has a new stage III pressure ulcer on his left gluteus with slough accumulation. He apparently spends a substantial portion of his days sitting in a wheelchair. I do not believe he has a Museum/gallery conservator. Due to his stroke, he lists to the left side, which is how his ulcer on his elbow developed. I suspect the gluteal ulcer is as a result of this, as well. 06/13/2022: The wound on his left gluteus is nearly healed. It is very superficial and quite clean. His son reports that he has just been applying Desitin to the area.  The elbow wound is smaller  but still has some hypertrophic granulation tissue and slough accumulation. No concern for infection at either site. 06/27/2022: The buttock wounds are healed. There is some tissue maceration without any significant skin breakdown. The elbow wound continues to contract. There is still some hypertrophic granulation tissue and slough buildup. 07/25/2022: The buttock wounds have reopened. The periwound skin has been kept very dry through liberal use of zinc oxide, however. The elbow wound dressing did not look like it had been changed in at least a week when it was removed. The underlying wound, however has not reaccumulated any hypertrophic granulation tissue; there is a thin layer of slough present. No malodor or purulent drainage from the wound itself. 08/15/2022: There is a tiny opening remaining on the buttocks. It is clean and there is no evidence of tissue maceration. The elbow looks very good. There is good beefy tissue present and it is flush with the surrounding skin. Home health has been coming out. 09/05/2022: The wound on his bottom is healed. The elbow shows signs of pressure induced deep tissue injury and also has some hypertrophic granulation tissue. 09/26/2022: The elbow looks much better today. There is a little bit of slough on the surface but there is no undermining or tunneling. He does have some hypertrophic granulation tissue accumulation. 10/17/2022: His elbow wound continues to improve. It is smaller, cleaner, and more superficial. There is hypertrophic granulation tissue present. 11/14/2022: His elbow ulcer is smaller again today. There is some slough on the surface. 12/19/2022: The pressure ulcer on his elbow is smaller again today. There is an area that looks like he may have bumped it on something, as it is a little bit bruised and purpleish. He is also requesting an order for physical therapy. 01/16/2023: The pressure ulcer on his elbow looks better than I have ever seen it. It is  smaller and more superficial with very little slough on the surface. 02/13/2023: The wound is just the tiniest bit smaller today. There is a little slough on the surface. The tissue around the wound does look a bit purpleish, as though he has been allowed to lean on it, unfortunately. 03/20/2023: The wound measured smaller today. There is less evidence of pressure on the site compared to last month. There is some slough on the surface and the underlying granulation tissue is a bit hypertrophic. Patient History Information obtained from Patient. Family History Diabetes - Maternal Grandparents, Heart Disease - Mother, Hypertension - Mother, No family history of Cancer, Hereditary Spherocytosis, Kidney Disease, Lung Disease, Seizures, Stroke, Thyroid Problems, Tuberculosis. Social History Never smoker, Marital Status - Married, Alcohol Use - Never, Drug Use - No History, Caffeine Use - Never. Medical History Eyes Patient has history of Cataracts - removed Cardiovascular Patient has history of Arrhythmia - Atrial Flutter/Atrial Fib, Coronary Artery Disease, Hypotension Denies history of Hypertension Musculoskeletal Patient has history of Osteoarthritis Neurologic Denies history of Paraplegia Hospitalization/Surgery History - open heart surgery. - IR generic histoircal. - appendectomy. - hernia repair. - joint replacement. - mitral valve repair. - mvp repair. - total hip arthroplasty. Medical A Surgical History Notes nd Constitutional Symptoms (General Health) obseity Ear/Nose/Mouth/Throat wears hearing aids Cardiovascular Mitral Valve Repair, Genitourinary BPH Neurologic CVA 2018, left side hemiparesis Brian [name], Brian [name] ([id number]) [id number] Page 7 of 10 Objective Constitutional no acute distress. Vitals Time Taken: 2:28 PM, Height: 66 in, Weight: 185 lbs, BMI: 29.9, Temperature: 98.7 F, Pulse: 76 bpm, Respiratory Rate: 18 breaths/min, Blood  Pressure:  108/59 mmHg. Respiratory Normal work of breathing on room air. General Notes: 03/20/2023: The wound measured smaller today. There is less evidence of pressure on the site compared to last month. There is some slough on the surface and the underlying granulation tissue is a bit hypertrophic. Integumentary (Hair, Skin) Wound #1 status is Open. Original cause of wound was Pressure Injury. The date acquired was: 12/19/2021. The wound has been in treatment 57 weeks. The wound is located on the Left Elbow. The wound measures 1.3cm length x 1.4cm width x 0.2cm depth; 1.429cm^2 area and 0.286cm^3 volume. There is Fat Layer (Subcutaneous Tissue) exposed. There is no tunneling or undermining noted. There is a medium amount of serosanguineous drainage noted. The wound margin is distinct with the outline attached to the wound base. There is large (67-100%) red, hyper - granulation within the wound bed. There is a small (1-33%) amount of necrotic tissue within the wound bed including Adherent Slough. The periwound skin appearance had no abnormalities noted for texture. The periwound skin appearance had no abnormalities noted for moisture. The periwound skin appearance had no abnormalities noted for color. Periwound temperature was noted as No Abnormality. The periwound has tenderness on palpation. 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 There was a Selective/Open Wound Non-Viable Tissue Debridement with a total area of 1.43 sq cm performed by Duanne Guess, MD. With the following instrument(s): Curette to remove Non-Viable tissue/material. Material removed includes [facility]  after achieving pain control using Lidocaine 4% Topical Solution. No specimens were taken. A time out was conducted at 14:37, prior to the start of the procedure. A Minimum amount of bleeding was controlled with Pressure. The procedure was tolerated well. Post Debridement Measurements: 1.3cm length x 1.4cm width x 0.2cm depth; 0.286cm^3 volume. Post debridement Stage noted as Category/Stage III. Character of Wound/Ulcer Post Debridement is improved. Post procedure Diagnosis Wound #1: Same as Pre-Procedure General Notes: scribed for Dr. Lady Gary by Samuella Bruin, RN. Plan Follow-up Appointments: Return appointment in 1 month. - Dr. Lady Gary - room 2 - 8/27 at 2:15 Anesthetic: (In clinic) Topical Lidocaine 4% applied to wound bed Bathing/ Shower/ Hygiene: May shower and wash wound with soap and water. - with dressing changes Off-Loading: Turn and reposition every 2 hours - stay off of left elbow wear prevalon boot on elbow as directed Other: - limit time in wheelchair, reposition in wheelchair every 1-2 hours Home Health: No change in wound care orders this week; continue Home Health for wound care. May utilize formulary equivalent dressing for wound treatment orders unless otherwise specified. - physical therapy once a week, wound care twice a week Other Home Health Orders/Instructions: - [facility] The following medication(s) was prescribed: lidocaine topical 4 % cream cream topical was prescribed at facility WOUND #1: - Elbow Wound Laterality: Left Cleanser: Soap and Water Every Other Day/30 Days Discharge Instructions: May shower and wash wound with dial antibacterial soap and water prior to dressing change. Cleanser: Wound Cleanser (Generic) Every Other Day/30 Days Discharge Instructions: Cleanse the wound with wound cleanser prior to applying a clean dressing using gauze sponges, not tissue or cotton balls. Prim Dressing: Hydrofera Blue Ready Transfer Foam, 2.5x2.5 (in/in) Every Other  Day/30 Days ary ASTON, LEMPKE ([id number]) [id number] Page 8  of 10 Discharge Instructions: Apply directly to wound bed as directed Secondary Dressing: ALLEVYN Gentle Border, 5x5 (in/in) (Generic) Every Other Day/30 Days Discharge Instructions: Apply over primary dressing as directed. Add-Ons: Cotton Tip Applicator, 6 (in) (Generic) Every Other Day/30 Days 03/20/2023: The wound measured smaller today. There is less evidence of pressure on the site compared to last month. There is some slough on the surface and the underlying granulation tissue is a bit hypertrophic. I used a curette to debride slough from the wound surface and then chemically cauterized the hypertrophic granulation tissue with silver nitrate. We will continue Hydrofera Blue with a foam heel cup and Prevalon boot for offloading. Follow-up in 1 month. Electronic Signature(s) Signed: 03/20/2023 3:19:26 PM By: Duanne Guess MD FACS Entered By: Duanne Guess on 03/20/2023 15:19:26 -------------------------------------------------------------------------------- HxROS Details Patient Name: Date of Service: Brian Brian [name] ID M. 03/20/2023 2:15 PM Medical Record Number: [id number] Patient Account Number: [id number] Date of Birth/Sex: Treating RN: 28-Mar-1933 (89 y.o. M) Primary Care Provider: Nadene Rubins Other Clinician: Referring Provider: Treating Provider/Extender: [name] in Treatment: 57 Information Obtained From Patient Constitutional Symptoms (General Health) Medical History: Past Medical History Notes: obseity Eyes Medical History: Positive for: Cataracts - removed Ear/Nose/Mouth/Throat Medical History: Past Medical History Notes: wears hearing aids Cardiovascular Medical History: Positive for: Arrhythmia - Atrial Flutter/Atrial Fib; Coronary Artery Disease; Hypotension Negative for: Hypertension Past Medical History Notes: Mitral Valve  Repair, Genitourinary Medical History: Past Medical History Notes: BPH Musculoskeletal Medical History: Positive for: Osteoarthritis Neurologic Medical History: ASHAAD, Brian [name] ([id number]) [id number] Page 9 of 10 Negative for: Paraplegia Past Medical History Notes: CVA 2018, left side hemiparesis HBO Extended History Items Eyes: Cataracts Immunizations Pneumococcal Vaccine: Received Pneumococcal Vaccination: Yes Received Pneumococcal Vaccination On or After 60th Birthday: Yes Implantable Devices None Hospitalization / Surgery History Type of Hospitalization/Surgery open heart surgery IR generic histoircal appendectomy hernia repair joint replacement mitral valve repair mvp repair total hip arthroplasty Family and Social History Cancer: No; Diabetes: Yes - Maternal Grandparents; Heart Disease: Yes - Mother; Hereditary Spherocytosis: No; Hypertension: Yes - Mother; Kidney Disease: No; Lung Disease: No; Seizures: No; Stroke: No; Thyroid Problems: No; Tuberculosis: No; Never smoker; Marital Status - Married; Alcohol Use: Never; Drug Use: No History; Caffeine Use: Never; Financial Concerns: No; Food, Clothing or Shelter Needs: No; Support System Lacking: No; Transportation Concerns: No Electronic Signature(s) Signed: 03/20/2023 3:21:59 PM By: Duanne Guess MD FACS Entered By: Duanne Guess on 03/20/2023 15:17:48 -------------------------------------------------------------------------------- SuperBill Details Patient Name: Date of Service: Brian [name] ID M. 03/20/2023 Medical Record Number: [id number] Patient Account Number: [id number] Date of Birth/Sex: Treating RN: 1932-11-20 (89 y.o. 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 I48.19 Other persistent atrial fibrillation I25.10 Atherosclerotic heart disease of native coronary artery without angina pectoris Facility Procedures : CPT4 Code: [id number] Description: 97597 - DEBRIDE WOUND 1ST 20 SQ CM OR < ICD-10 Diagnosis Description L89.023 Pressure ulcer of left elbow, stage 3 Modifier: Quantity: 1 Physician Procedures BARTLOMIEJ, BURRS ([id number]): CPT4 Code Description [id number] 99213 - WC PHYS LEVEL 3 - EST PT ICD-10 Diagnosis Description L89.023 Pressure ulcer of left elbow, stage 3 I63.00 Cerebral infarction due to thrombosis of unspecified precerebra I48.19 Other  persistent atrial fibrillation I25.10 Atherosclerotic heart disease of native  coronary artery without [id number] Page 10 of 10: Quantity Modifier 25 1 l artery angina pectoris RODERICH, COCKCROFT M ([id number]): [id number] 97597 - WC PHYS DEBR WO ANESTH 20 SQ CM ICD-10 Diagnosis Description L89.023 Pressure ulcer of left elbow, stage 3 [id number] Page 10 of 10: 1 Electronic Signature(s) Signed: 03/20/2023 3:20:38 PM By: Duanne Guess MD FACS Entered By: Duanne Guess on 03/20/2023 15:20:37

## 2023-03-20 NOTE — Progress Notes (Signed)
Brian Brian [name] ([id number]) [id number] Page 1 of 7 Visit Report for 03/20/2023 Arrival Information Details Patient Name: Date of Service: Brian Brian [name] ID Brian [name] 03/20/2023 2:15 PM Medical Record Number: [id number] Patient Account Number: [id number] Date of Birth/Sex: Treating RN: [name] (89 y.o. Brian Brian [name] Primary Care Brian Brian [name]: Brian Brian [name] Other Clinician: Referring Brian Brian [name]: Treating Brian Brian [name]/Extender: Brian Brian [name] in Treatment: 63 Visit Information History Since Last Visit Added or deleted any medications: No Patient Arrived: Wheel Chair Any new allergies or adverse reactions: No Arrival Time: 14:24 Had a fall or experienced change in No Accompanied By: son activities of daily living that may affect Transfer Assistance: None risk of falls: Patient Identification Verified: Yes Signs or symptoms of abuse/neglect since last visito No Secondary Verification Process Completed: Yes Hospitalized since last visit: No Patient Requires Transmission-Based Precautions: No Implantable device outside of the clinic excluding No Patient Has Alerts: Yes cellular tissue based products placed in the center Patient Alerts: Patient on Blood Thinner since last visit: Has Dressing in Place as Prescribed: Yes Pain Present Now: Yes Electronic Signature(s) Signed: 03/20/2023 4:02:14 PM By: Brian Brian [name] Entered By: Brian Brian [name] on 03/20/2023 14:27:57 -------------------------------------------------------------------------------- Encounter Discharge Information Details Patient Name: Date of Service: Brian Brian [name] ID Brian [name] 03/20/2023 2:15 PM Medical Record Number: [id number] Patient Account Number: [id number] Date of Birth/Sex: Treating RN: [name] (89 y.o. Brian Brian [name] Primary Care Brian Brian [name]: Brian Brian [name] Other Clinician: Referring Laquandra Carrillo: Treating Naveh Rickles/Extender: Brian Brian [name]  in Treatment: 38 Encounter Discharge Information Items Post Procedure Vitals Discharge Condition: Stable Temperature (F): 98.7 Ambulatory Status: Wheelchair Pulse (bpm): 76 Discharge Destination: Home Respiratory Rate (breaths/min): 18 Transportation: Private Auto Blood Pressure (mmHg): 108/59 Accompanied By: son Schedule Follow-up Appointment: Yes Clinical Summary of Care: Patient Declined Electronic Signature(s) Signed: 03/20/2023 4:02:14 PM By: Brian Brian [name] Entered By: Brian Brian [name] on 03/20/2023 14:55:28 Brian Brian [name], Brian Brian [name] ([id number]) [id number] Page 2 of 7 -------------------------------------------------------------------------------- Lower Extremity Assessment Details Patient Name: Date of Service: Brian Brian [name] ID Brian [name] 03/20/2023 2:15 PM Medical Record Number: [id number] Patient Account Number: [id number] Date of Birth/Sex: Treating RN: Brian Brian [name] (89 y.o. Brian Brian [name] Primary Care Brian Brian [name]: Brian Brian [name] Other Clinician: Referring Andrian Urbach: Treating Mazi Schuff/Extender: Brian Brian [name] in Treatment: 24 Electronic Signature(s) Signed: 03/20/2023 4:02:14 PM By: Brian Brian [name] By: Brian Brian [name] on 03/20/2023 14:30:31 -------------------------------------------------------------------------------- Multi Wound Chart Details Patient Name: Date of Service: Brian Brian [name] ID Brian [name] 03/20/2023 2:15 PM Medical Record Number: [id number] Patient Account Number: [id number] Date of Birth/Sex: Treating RN: [name] (89 y.o. Brian [name]) Primary Care Mekhi Sonn: Brian Brian [name] Other Clinician: Referring Sherron Mapp: Treating Corbin Falck/Extender: Brian Brian [name] in Treatment: 57 Vital Signs Height(in): 66 Pulse(bpm): 76 Weight(lbs): 185 Blood Pressure(mmHg): 108/59 Body Mass Index(BMI): 29.9 Temperature(F): 98.7 Respiratory Rate(breaths/min): 18 [1:Photos:] [N/A:N/A] Left Elbow N/A N/A Wound  Location: Pressure Injury N/A N/A Wounding Event: Pressure Ulcer N/A N/A Primary Etiology: Cataracts, Arrhythmia, Coronary N/A N/A Comorbid History: Artery Disease, Hypotension, Osteoarthritis 12/19/2021 N/A N/A Date Acquired: 67 N/A N/A Weeks of Treatment: Open N/A N/A Wound Status: No N/A N/A Wound Recurrence: Yes N/A N/A Clustered Wound: 1.3x1.4x0.2 N/A N/A Measurements L x W x D (cm) 1.429 N/A N/A A (cm) : rea 0.286 N/A N/A Volume (cm) : 85.10% N/A N/A % Reduction in A rea: 70.30% N/A N/A % Reduction in Volume: Category/Stage III N/A N/A Classification: Medium N/A N/A Exudate A mount: Serosanguineous N/A N/A Exudate Type: red, brown N/A N/A Exudate  Color: Distinct, outline attached N/A N/A Wound Margin: Large (67-100%) N/A N/A Granulation A mount: Brian Brian [name] ([id number]) [id number] Page 3 of 7 Red, Hyper-granulation N/A N/A Granulation Quality: Small (1-33%) N/A N/A Necrotic Amount: Fat Layer (Subcutaneous Tissue): Yes N/A N/A Exposed Structures: Fascia: No Tendon: No Muscle: No Joint: No Bone: No Medium (34-66%) N/A N/A Epithelialization: Debridement - Selective/Open Wound N/A N/A Debridement: Pre-procedure Verification/Time Out 14:37 N/A N/A Taken: Lidocaine 4% Topical Solution N/A N/A Pain Control: Slough N/A N/A Tissue Debrided: Non-Viable Tissue N/A N/A Level: 1.43 N/A N/A Debridement A (sq cm): rea Curette N/A N/A Instrument: Minimum N/A N/A Bleeding: Pressure N/A N/A Hemostasis A chieved: Procedure was tolerated well N/A N/A Debridement Treatment Response: 1.3x1.4x0.2 N/A N/A Post Debridement Measurements L x W x D (cm) 0.286 N/A N/A Post Debridement Volume: (cm) Category/Stage III N/A N/A Post Debridement Stage: No Abnormalities Noted N/A N/A Periwound Skin Texture: No Abnormalities Noted N/A N/A Periwound Skin Moisture: No Abnormalities Noted N/A N/A Periwound Skin Color: No Abnormality  N/A N/A Temperature: Yes N/A N/A Tenderness on Palpation: Debridement N/A N/A Procedures Performed: Treatment Notes Wound #1 (Elbow) Wound Laterality: Left Cleanser Soap and Water Discharge Instruction: May shower and wash wound with dial antibacterial soap and water prior to dressing change. Wound Cleanser Discharge Instruction: Cleanse the wound with wound cleanser prior to applying a clean dressing using gauze sponges, not tissue or cotton balls. Peri-Wound Care Topical Primary Dressing Hydrofera Blue Ready Transfer Foam, 2.5x2.5 (in/in) Discharge Instruction: Apply directly to wound bed as directed Secondary Dressing ALLEVYN Gentle Border, 5x5 (in/in) Discharge Instruction: Apply over primary dressing as directed. Secured With Compression Wrap Compression Stockings Add-Ons Engineer, maintenance, 6 (in) [employer]) Signed: 03/20/2023 3:11:20 PM By: Duanne Guess MD FACS Entered By: Duanne Guess on 03/20/2023 15:11:20 -------------------------------------------------------------------------------- Multi-Disciplinary Care Plan Details Patient Name: Date of Service: Brian Brian [name] ID Brian [name] 03/20/2023 2:15 PM Medical Record Number: [id number] Patient Account Number: [id number] Date of Birth/Sex: Treating RN: [name] (89 y.o. Harvie Heck, Khani Judie Brian [name] ([id number]) [phone].pdf Page 4 of 7 Primary Care Roxana Lai: Brian Brian [name] Other Clinician: Referring Hennesy Sobalvarro: Treating Jaccob Czaplicki/Extender: Brian Brian [name] in Treatment: 32 Multidisciplinary Care Plan reviewed with physician Active Inactive Abuse / Safety / Falls / Self Care Management Nursing Diagnoses: Impaired physical mobility Potential for falls Goals: Patient/caregiver will verbalize understanding of skin care regimen Date Initiated: 02/14/2022 Target Resolution Date: 04/21/2023 Goal Status: Active Patient/caregiver will  verbalize/demonstrate measures taken to prevent injury and/or falls Date Initiated: 02/14/2022 Target Resolution Date: 04/21/2023 Goal Status: Active Interventions: Assess fall risk on admission and as needed Assess self care needs on admission and as needed Notes: Wound/Skin Impairment Nursing Diagnoses: Impaired tissue integrity Knowledge deficit related to ulceration/compromised skin integrity Goals: Patient/caregiver will verbalize understanding of skin care regimen Date Initiated: 02/14/2022 Target Resolution Date: 04/21/2023 Goal Status: Active Ulcer/skin breakdown will have a volume reduction of 30% by week 4 Date Initiated: 02/14/2022 Date Inactivated: 05/23/2022 Target Resolution Date: 05/05/2022 Goal Status: Unmet Unmet Reason: new PU left glut Interventions: Assess patient/caregiver ability to obtain necessary supplies Assess patient/caregiver ability to perform ulcer/skin care regimen upon admission and as needed Assess ulceration(s) every visit Treatment Activities: Skin care regimen initiated : 02/14/2022 Topical wound management initiated : 02/14/2022 Notes: Electronic Signature(s) Signed: 03/20/2023 4:02:14 PM By: Brian Brian [name] Entered By: Brian Brian [name] on 03/20/2023 14:53:33 -------------------------------------------------------------------------------- Pain Assessment Details Patient Name: Date of Service: Brian Brian Brian [name], Brian Brian [name] ID Brian [name] 03/20/2023 2:15 PM Medical  Record Number: [id number] Patient Account Number: [id number] Date of Birth/Sex: Treating RN: [birth year] (89 y.o. Brian Brian [name] Primary Care Sandee Bernath: Brian Brian [name] Other Clinician: Referring Mescal Flinchbaugh: Treating Kona Yusuf/Extender: Brian Brian [name] in Treatment: [address], Brian Brian [name] ([id number]) [phone].pdf Page 5 of 7 Active Problems Location of Pain Severity and Description of Pain Patient Has Paino Yes Site Locations Pain Location: Pain in  Ulcers Duration of the Pain. Constant / Intermittento Constant Rate the pain. Current Pain Level: 5 Pain Management and Medication Current Pain Management: Medication: Yes Electronic Signature(s) Signed: 03/20/2023 4:02:14 PM By: Brian Brian [name] Entered By: Brian Brian [name] on 03/20/2023 14:28:20 -------------------------------------------------------------------------------- Patient/Caregiver Education Details Patient Name: Date of Service: Brian Brian [name] ID Brian [name] 7/30/2024andnbsp2:15 PM Medical Record Number: [id number] Patient Account Number: [id number] Date of Birth/Gender: Treating RN: [name] (89 y.o. Brian Brian [name] Primary Care Physician: Brian Brian [name] Other Clinician: Referring Physician: Treating Physician/Extender: Brian Brian [name] in Treatment: 30 Education Assessment Education Provided To: Patient and Caregiver Education Topics Provided Offloading: Methods: Explain/Verbal Responses: Reinforcements needed, State content correctly Electronic Signature(s) Signed: 03/20/2023 4:02:14 PM By: Brian Brian [name] Entered By: Brian Brian [name] on 03/20/2023 14:54:45 Brian Brian [name], Brian Brian [name] ([id number]) [id number] Page 6 of 7 -------------------------------------------------------------------------------- Wound Assessment Details Patient Name: Date of Service: Brian Brian [name] ID Brian [name] 03/20/2023 2:15 PM Medical Record Number: [id number] Patient Account Number: [id number] Date of Birth/Sex: Treating RN: [birth year] (89 y.o. Brian Brian [name] Primary Care Chaim Gatley: Brian Brian [name] Other Clinician: Referring Tandy Grawe: Treating Kelisha Dall/Extender: Brian Brian [name] in Treatment: 57 Wound Status Wound Number: 1 Primary Pressure Ulcer Etiology: Wound Location: Left Elbow Wound Status: Open Wounding Event: Pressure Injury Comorbid Cataracts, Arrhythmia, Coronary Artery Disease, Date Acquired:  12/19/2021 History: Hypotension, Osteoarthritis Weeks Of Treatment: 57 Clustered Wound: Yes Photos Wound Measurements Length: (cm) 1.3 Width: (cm) 1.4 Depth: (cm) 0.2 Area: (cm) 1.429 Volume: (cm) 0.286 % Reduction in Area: 85.1% % Reduction in Volume: 70.3% Epithelialization: Medium (34-66%) Tunneling: No Undermining: No Wound Description Classification: Category/Stage III Wound Margin: Distinct, outline attached Exudate Amount: Medium Exudate Type: Serosanguineous Exudate Color: red, brown Foul Odor After Cleansing: No Slough/Fibrino Yes Wound Bed Granulation Amount: Large (67-100%) Exposed Structure Granulation Quality: Red, Hyper-granulation Fascia Exposed: No Necrotic Amount: Small (1-33%) Fat Layer (Subcutaneous Tissue) Exposed: Yes Necrotic Quality: Adherent Slough Tendon Exposed: No Muscle Exposed: No Joint Exposed: No Bone Exposed: No Periwound Skin Texture Texture Color No Abnormalities Noted: Yes No Abnormalities Noted: Yes Moisture Temperature / Pain No Abnormalities Noted: Yes Temperature: No Abnormality Tenderness on Palpation: Yes Treatment Notes Wound #1 (Elbow) Wound Laterality: Left Cleanser Soap and Water Discharge Instruction: May shower and wash wound with dial antibacterial soap and water prior to dressing change. Wound Cleanser Discharge Instruction: Cleanse the wound with wound cleanser prior to applying a clean dressing using gauze sponges, not tissue or cotton balls. Peri-Wound Care Brian Brian [name], Brian Brian [name] ([id number]) [id number] Page 7 of 7 Topical Primary Dressing Hydrofera Blue Ready Transfer Foam, 2.5x2.5 (in/in) Discharge Instruction: Apply directly to wound bed as directed Secondary Dressing ALLEVYN Gentle Border, 5x5 (in/in) Discharge Instruction: Apply over primary dressing as directed. Secured With Compression Wrap Compression Stockings Add-Ons Engineer, maintenance, 6 (in) Electronic  Signature(s) Signed: 03/20/2023 4:02:14 PM By: Brian Brian [name] Entered By: Brian Brian [name] on 03/20/2023 14:35:12 -------------------------------------------------------------------------------- Vitals Details Patient Name: Date of Service: Brian Brian [name] ID Brian [name] 03/20/2023 2:15 PM Medical Record Number: [id number] Patient Account Number: [id number] Date of Birth/Sex: Treating RN: [name] (89 y.o. Lenise Herald,  Brian Brian [name] Primary Care Ramsie Ostrander: Brian Brian [name] Other Clinician: Referring Julionna Marczak: Treating Mellanie Bejarano/Extender: Brian Brian [name] in Treatment: 57 Vital Signs Time Taken: 14:28 Temperature (F): 98.7 Height (in): 66 Pulse (bpm): 76 Weight (lbs): 185 Respiratory Rate (breaths/min): 18 Body Mass Index (BMI): 29.9 Blood Pressure (mmHg): 108/59 Reference Range: 80 - 120 mg / dl Electronic Signature(s) Signed: 03/20/2023 4:02:14 PM By: Brian Brian [name] Entered By: Brian Brian [name] on 03/20/2023 14:30:16

## 2023-03-21 ENCOUNTER — Telehealth: Payer: Self-pay | Admitting: Family Medicine

## 2023-03-21 DIAGNOSIS — Z7901 Long term (current) use of anticoagulants: Secondary | ICD-10-CM | POA: Diagnosis not present

## 2023-03-21 DIAGNOSIS — L89023 Pressure ulcer of left elbow, stage 3: Secondary | ICD-10-CM | POA: Diagnosis not present

## 2023-03-21 DIAGNOSIS — I119 Hypertensive heart disease without heart failure: Secondary | ICD-10-CM | POA: Diagnosis not present

## 2023-03-21 DIAGNOSIS — I69354 Hemiplegia and hemiparesis following cerebral infarction affecting left non-dominant side: Secondary | ICD-10-CM | POA: Diagnosis not present

## 2023-03-21 DIAGNOSIS — I4819 Other persistent atrial fibrillation: Secondary | ICD-10-CM | POA: Diagnosis not present

## 2023-03-21 DIAGNOSIS — I2510 Atherosclerotic heart disease of native coronary artery without angina pectoris: Secondary | ICD-10-CM | POA: Diagnosis not present

## 2023-03-21 NOTE — Telephone Encounter (Signed)
HH ORDERS   Caller Name: [facility] Agency Name: [facility] Callback Phone #: [phone] (secure line)  Service Requested: needs verbal order for physical therapy and speech therapy. Family reported that a pill got stuck in his throat today and seating in his chest area, no coughing. Pt got better after he was raised seating up.   Frequency of Visits: 3times x 2wks 1time = 1wk

## 2023-03-22 NOTE — Telephone Encounter (Signed)
Attempted to contact Charise from [facility]. No answer left a voicemail for her to call me back.

## 2023-03-22 NOTE — Telephone Encounter (Signed)
Spoke to [place] at [facility] and she took the verbal order. She also stated that the patients son had to crush up the Mucinex pills yesterday due to the patient having trouble swallowing the pills. Patients son bought the liquid Mucinex and the patient did good today.  Charise also stated that they don't have a speech therapist so they can't send one out to the patients home.

## 2023-03-23 DIAGNOSIS — R1312 Dysphagia, oropharyngeal phase: Secondary | ICD-10-CM | POA: Diagnosis not present

## 2023-03-23 DIAGNOSIS — Z952 Presence of prosthetic heart valve: Secondary | ICD-10-CM | POA: Diagnosis not present

## 2023-03-23 DIAGNOSIS — Z86718 Personal history of other venous thrombosis and embolism: Secondary | ICD-10-CM | POA: Diagnosis not present

## 2023-03-23 DIAGNOSIS — I119 Hypertensive heart disease without heart failure: Secondary | ICD-10-CM | POA: Diagnosis not present

## 2023-03-23 DIAGNOSIS — L89023 Pressure ulcer of left elbow, stage 3: Secondary | ICD-10-CM | POA: Diagnosis not present

## 2023-03-23 DIAGNOSIS — M179 Osteoarthritis of knee, unspecified: Secondary | ICD-10-CM | POA: Diagnosis not present

## 2023-03-23 DIAGNOSIS — I69354 Hemiplegia and hemiparesis following cerebral infarction affecting left non-dominant side: Secondary | ICD-10-CM | POA: Diagnosis not present

## 2023-03-23 DIAGNOSIS — Z79891 Long term (current) use of opiate analgesic: Secondary | ICD-10-CM | POA: Diagnosis not present

## 2023-03-23 DIAGNOSIS — Z7901 Long term (current) use of anticoagulants: Secondary | ICD-10-CM | POA: Diagnosis not present

## 2023-03-23 DIAGNOSIS — I2510 Atherosclerotic heart disease of native coronary artery without angina pectoris: Secondary | ICD-10-CM | POA: Diagnosis not present

## 2023-03-23 DIAGNOSIS — I4819 Other persistent atrial fibrillation: Secondary | ICD-10-CM | POA: Diagnosis not present

## 2023-03-24 ENCOUNTER — Other Ambulatory Visit: Payer: Self-pay | Admitting: Family Medicine

## 2023-03-24 DIAGNOSIS — Z7901 Long term (current) use of anticoagulants: Secondary | ICD-10-CM | POA: Diagnosis not present

## 2023-03-24 DIAGNOSIS — I119 Hypertensive heart disease without heart failure: Secondary | ICD-10-CM | POA: Diagnosis not present

## 2023-03-24 DIAGNOSIS — J301 Allergic rhinitis due to pollen: Secondary | ICD-10-CM

## 2023-03-24 DIAGNOSIS — L89023 Pressure ulcer of left elbow, stage 3: Secondary | ICD-10-CM | POA: Diagnosis not present

## 2023-03-24 DIAGNOSIS — I69354 Hemiplegia and hemiparesis following cerebral infarction affecting left non-dominant side: Secondary | ICD-10-CM | POA: Diagnosis not present

## 2023-03-24 DIAGNOSIS — I4819 Other persistent atrial fibrillation: Secondary | ICD-10-CM | POA: Diagnosis not present

## 2023-03-24 DIAGNOSIS — I2510 Atherosclerotic heart disease of native coronary artery without angina pectoris: Secondary | ICD-10-CM | POA: Diagnosis not present

## 2023-03-27 ENCOUNTER — Encounter: Payer: Self-pay | Admitting: Student in an Organized Health Care Education/Training Program

## 2023-03-27 ENCOUNTER — Ambulatory Visit
Payer: Medicare Other | Attending: Student in an Organized Health Care Education/Training Program | Admitting: Student in an Organized Health Care Education/Training Program

## 2023-03-27 VITALS — BP 105/46 | HR 67 | Temp 97.0°F | Resp 16 | Ht 65.0 in | Wt 190.0 lb

## 2023-03-27 DIAGNOSIS — M48062 Spinal stenosis, lumbar region with neurogenic claudication: Secondary | ICD-10-CM

## 2023-03-27 DIAGNOSIS — I2510 Atherosclerotic heart disease of native coronary artery without angina pectoris: Secondary | ICD-10-CM | POA: Diagnosis not present

## 2023-03-27 DIAGNOSIS — G894 Chronic pain syndrome: Secondary | ICD-10-CM | POA: Diagnosis not present

## 2023-03-27 DIAGNOSIS — G8929 Other chronic pain: Secondary | ICD-10-CM | POA: Insufficient documentation

## 2023-03-27 DIAGNOSIS — M5136 Other intervertebral disc degeneration, lumbar region: Secondary | ICD-10-CM

## 2023-03-27 DIAGNOSIS — I119 Hypertensive heart disease without heart failure: Secondary | ICD-10-CM | POA: Diagnosis not present

## 2023-03-27 DIAGNOSIS — M170 Bilateral primary osteoarthritis of knee: Secondary | ICD-10-CM

## 2023-03-27 DIAGNOSIS — M47816 Spondylosis without myelopathy or radiculopathy, lumbar region: Secondary | ICD-10-CM | POA: Insufficient documentation

## 2023-03-27 DIAGNOSIS — I4819 Other persistent atrial fibrillation: Secondary | ICD-10-CM | POA: Diagnosis not present

## 2023-03-27 DIAGNOSIS — M4726 Other spondylosis with radiculopathy, lumbar region: Secondary | ICD-10-CM

## 2023-03-27 DIAGNOSIS — L89023 Pressure ulcer of left elbow, stage 3: Secondary | ICD-10-CM | POA: Diagnosis not present

## 2023-03-27 DIAGNOSIS — M5416 Radiculopathy, lumbar region: Secondary | ICD-10-CM | POA: Diagnosis not present

## 2023-03-27 DIAGNOSIS — Z7901 Long term (current) use of anticoagulants: Secondary | ICD-10-CM | POA: Diagnosis not present

## 2023-03-27 DIAGNOSIS — I69354 Hemiplegia and hemiparesis following cerebral infarction affecting left non-dominant side: Secondary | ICD-10-CM | POA: Diagnosis not present

## 2023-03-27 MED ORDER — PREGABALIN 25 MG PO CAPS
25.0000 mg | ORAL_CAPSULE | Freq: Every day | ORAL | 5 refills | Status: DC
Start: 2023-03-27 — End: 2023-06-05

## 2023-03-27 MED ORDER — OXYCODONE-ACETAMINOPHEN 7.5-325 MG PO TABS
1.0000 | ORAL_TABLET | Freq: Two times a day (BID) | ORAL | 0 refills | Status: AC | PRN
Start: 2023-04-06 — End: 2023-05-06

## 2023-03-27 MED ORDER — PREGABALIN 25 MG PO CAPS
25.0000 mg | ORAL_CAPSULE | Freq: Every day | ORAL | 5 refills | Status: DC
Start: 2023-03-27 — End: 2023-03-27

## 2023-03-27 MED ORDER — OXYCODONE-ACETAMINOPHEN 7.5-325 MG PO TABS
1.0000 | ORAL_TABLET | Freq: Two times a day (BID) | ORAL | 0 refills | Status: DC | PRN
Start: 2023-06-05 — End: 2023-04-24

## 2023-03-27 MED ORDER — OXYCODONE-ACETAMINOPHEN 7.5-325 MG PO TABS
1.0000 | ORAL_TABLET | Freq: Two times a day (BID) | ORAL | 0 refills | Status: DC | PRN
Start: 2023-05-06 — End: 2023-04-24

## 2023-03-27 NOTE — Progress Notes (Signed)
Nursing Pain Medication Assessment:  Safety precautions to be maintained throughout the outpatient stay will include: orient to surroundings, keep bed in low position, maintain call bell within reach at all times, provide assistance with transfer out of bed and ambulation.  Medication Inspection Compliance: Mr. Kapala did not comply with our request to bring his pills to be counted. He was reminded that bringing the medication bottles, even when empty, is a requirement.  Medication: None brought in. Pill/Patch Count: None available to be counted. Bottle Appearance: No container available. Did not bring bottle(s) to appointment. Filled Date: N/A Last Medication intake:  Yesterday

## 2023-03-27 NOTE — Progress Notes (Signed)
PROVIDER NOTE: Information contained herein reflects review and annotations entered in association with encounter. Interpretation of such information and data should be left to medically-trained personnel. Information provided to patient can be located elsewhere in the medical record under "Patient Instructions". Document created using STT-dictation technology, any transcriptional errors that may result from process are unintentional.    Patient: Brian [name]  Service Category: E/M  Provider: Edward Jolly, MD  DOB: [birth year]  DOS: 03/27/2023  Referring Provider: Mliss Sax  MRN: [id number]  Specialty: Interventional Pain Management  PCP: Mliss Sax, MD  Type: Established Patient  Setting: Ambulatory outpatient    Location: Office  Delivery: Face-to-face     HPI  Brian [name], a 89 y.o. year old male, is here today because of his Lumbar spondylosis [M47.816]. Brian [name] primary complain today is Shoulder Pain (left) Last encounter: My last encounter with him was on 12/05/2022 for Pertinent problems: Brian [name] has Dysarthria, post-stroke; Chronic radicular lumbar pain; Gait disturbance; Paraplegic immobility syndrome; History of cerebrovascular accident; Depression with anxiety; Lumbar spondylosis; Lumbar degenerative disc disease; Bilateral primary osteoarthritis of knee; Pain management contract signed; and Chronic pain syndrome on their pertinent problem list. Pain Assessment: Severity of Chronic pain is reported as a 5 /10. Location: Shoulder Left/radiates into left elbow. Onset: More than a month ago. Quality: Aching, Discomfort. Timing: Constant. Modifying factor(s): meds. Vitals:  height is 5\' 5"  (1.651 m) and weight is 190 lb (86.2 kg). His temperature is 97 F (36.1 C) (abnormal). His blood pressure is 105/46 (abnormal) and his pulse is 67. His respiration is 16 and oxygen saturation is 98%.   Reason for encounter: medication management.   No change in  medical history since last visit.  Patient's pain is at baseline.  Patient continues multimodal pain regimen as prescribed.  States that it provides pain relief and improvement in functional status.   04/11/22: 2nd pt visit No significant change in his medical history.  Patient unable to submit a urine toxicology screen so we obtained a serum drug screen. We will have patient sign pain contract.  He was accompanied today by his son and their CMA Prescription for Percocet as below 7.5 mg twice daily as needed which was being managed by their primary care provider, Dr. Doreene Burke Also recommend restarting Lyrica which the patient was on before, 25 mg nightly as a pain adjunct.   HPI from initial clinic visit: Brian [name] is a pleasant 89 year old male who is accompanied today by his son and his nursing aide.  He has a longstanding history of diffuse arthritis most pronounced in his glenohumeral joints and knees.  He has tried physical therapy and continues to engage in participate in physical therapy twice a week.  He has a history of stroke with associated hemiparesis.  He has significant thoracic kyphosis.  He has difficulty ambulating and presents today in a wheelchair.  He also has bilateral hip osteoarthritis.  He was previously on Percocet 5 mg twice a day which became less effective and as a result, his primary care provider increased it to 7.5 mg twice a day which he is not getting any benefit from.  He is currently anticoagulated on Eliquis for his coronary artery disease.  He is also on Lyrica 25 mg twice a day.  He is also on Seroquel and Zoloft for depression management.    Pharmacotherapy Assessment  Analgesic:  Percocet 7.5 mg BID PRN Lyrica 25 mg QHS  Monitoring: [place] PMP: PDMP  reviewed during this encounter.       Pharmacotherapy: No side-effects or adverse reactions reported. Compliance: No problems identified. Effectiveness: Clinically acceptable.  Valerie Salts, RN  03/27/2023  2:52 PM   Sign when Signing Visit Nursing Pain Medication Assessment:  Safety precautions to be maintained throughout the outpatient stay will include: orient to surroundings, keep bed in low position, maintain call bell within reach at all times, provide assistance with transfer out of bed and ambulation.  Medication Inspection Compliance: Brian [name] did not comply with our request to bring his pills to be counted. He was reminded that bringing the medication bottles, even when empty, is a requirement.  Medication: None brought in. Pill/Patch Count: None available to be counted. Bottle Appearance: No container available. Did not bring bottle(s) to appointment. Filled Date: N/A Last Medication intake:  Yesterday    No results found for: "CBDTHCR" No results found for: "D8THCCBX" No results found for: "D9THCCBX"  UDS:  Summary  Date Value Ref Range Status  12/27/2021 Note  Final    Comment:    ==================================================================== Compliance Drug Analysis, Ur ==================================================================== Test                             Result       Flag       Units  Drug Present and Declared for Prescription Verification   Oxycodone                      2038         EXPECTED   ng/mg creat   Oxymorphone                    6930         EXPECTED   ng/mg creat   Noroxycodone                   746          EXPECTED   ng/mg creat   Noroxymorphone                 488          EXPECTED   ng/mg creat    Sources of oxycodone are scheduled prescription medications.    Oxymorphone, noroxycodone, and noroxymorphone are expected    metabolites of oxycodone. Oxymorphone is also available as a    scheduled prescription medication.    Pregabalin                     PRESENT      EXPECTED   Sertraline                     PRESENT      EXPECTED   Desmethylsertraline            PRESENT      EXPECTED    Desmethylsertraline is an expected metabolite of  sertraline.    Quetiapine                     PRESENT      EXPECTED   Acetaminophen                  PRESENT      EXPECTED  Drug Absent but Declared for Prescription Verification   Alprazolam  Not Detected UNEXPECTED ng/mg creat   Metoprolol                     Not Detected UNEXPECTED ==================================================================== Test                      Result    Flag   Units      Ref Range   Creatinine              100              mg/dL      >=29 ==================================================================== Declared Medications:  The flagging and interpretation on this report are based on the  following declared medications.  Unexpected results may arise from  inaccuracies in the declared medications.   **Note: The testing scope of this panel includes these medications:   Alprazolam (Xanax)  Metoprolol  Oxycodone (Percocet)  Pregabalin (Lyrica)  Quetiapine (Seroquel)  Sertraline (Zoloft)   **Note: The testing scope of this panel does not include small to  moderate amounts of these reported medications:   Acetaminophen (Tylenol)  Acetaminophen (Percocet)   **Note: The testing scope of this panel does not include the  following reported medications:   Apixaban (Eliquis)  Cetirizine (Zyrtec)  Finasteride (Proscar)  Losartan (Cozaar)  Multivitamin  Pantoprazole (Protonix)  Polyethylene Glycol (MiraLAX)  Pravastatin  Tamsulosin (Flomax) ==================================================================== For clinical consultation, please call [phone]. ====================================================================       ROS  Constitutional: Denies any fever or chills Gastrointestinal: No reported hemesis, hematochezia, vomiting, or acute GI distress Musculoskeletal:  Diffuse musculoskeletal pain Neurological:  Dysarthria and dysphagia from previous stroke  Medication Review  ALPRAZolam, Mupirocin,  QUEtiapine, apixaban, cetirizine, clotrimazole, finasteride, fluticasone, hydrocortisone ointment, melatonin, metoprolol succinate, multivitamin, oxyCODONE-acetaminophen, pantoprazole, polyethylene glycol powder, pravastatin, pregabalin, sertraline, and tamsulosin  History Review  Allergy: Brian [name] is allergic to novocain [procaine hcl], other, and penicillins. Drug: Brian [name]  reports no history of drug use. Alcohol:  reports no history of alcohol use. Tobacco:  reports that he has quit smoking. He has never used smokeless tobacco. Social: Brian [name]  reports that he has quit smoking. He has never used smokeless tobacco. He reports that he does not drink alcohol and does not use drugs. Medical:  has a past medical history of Anemia, Atrial flutter (HCC), BPH (benign prostatic hypertrophy), Epistaxis, Hemorrhage of gastrointestinal tract, unspecified, History of open heart surgery, Hypertension, Obesity (BMI 30.0-34.9) (06/29/2020), Osteoarthrosis, unspecified whether generalized or localized, unspecified site, Personal history of venous thrombosis and embolism, Rosacea, Stroke (HCC), and TIA (transient ischemic attack). Surgical: Mr. Stennis  has a past surgical history that includes Appendectomy; Hernia repair; Total hip arthroplasty; Joint replacement; mvp repair; Mitral valve repair; Radiology with anesthesia (N/A, 05/04/2016); ir generic historical (05/04/2016); ir generic historical (05/04/2016); and ir generic historical (06/07/2016). Family: family history includes Coronary artery disease in his father; Rheumatic fever in his mother.  Laboratory Chemistry Profile   Renal Lab Results  Component Value Date   BUN 18 03/14/2022   CREATININE 0.98 03/14/2022   BCR 19 02/06/2020   GFR 84.36 11/01/2021   GFRAA >60 11/03/2018   GFRNONAA >60 03/14/2022    Hepatic Lab Results  Component Value Date   AST 25 03/14/2022   ALT 16 03/14/2022   ALBUMIN 3.2 (L) 03/14/2022   ALKPHOS 68 03/14/2022    LIPASE 23 03/14/2022    Electrolytes Lab Results  Component Value Date   NA 137 03/14/2022  K 3.7 03/14/2022   CL 104 03/14/2022   CALCIUM 8.8 (L) 03/14/2022   MG 1.7 03/15/2017   PHOS 2.4 (L) 05/07/2016    Bone No results found for: "VD25OH", "VD125OH2TOT", "ZO1096EA5", "WU9811BJ4", "25OHVITD1", "25OHVITD2", "25OHVITD3", "TESTOFREE", "TESTOSTERONE"  Inflammation (CRP: Acute Phase) (ESR: Chronic Phase) Lab Results  Component Value Date   LATICACIDVEN 1.9 03/15/2022         Note: Above Lab results reviewed.  Recent Imaging Review  ECHOCARDIOGRAM COMPLETE    ECHOCARDIOGRAM REPORT       Patient Name:   Nohlan TRYSTYN ARMOND Date of Exam: 03/15/2022 Medical Rec #:  [id number]      Height:       68.0 in Accession #:    [id number]     Weight:       185.0 lb Date of Birth:  11-Sep-1932      BSA:          1.977 m Patient Age:    89 years       BP:           125/64 mmHg Patient Gender: M              HR:           98 bpm. Exam Location:  Inpatient  Procedure: 2D Echo, Cardiac Doppler and Color Doppler                                MODIFIED REPORT: This report was modified by Weston Brass MD on 03/15/2022 due to revision.  Indications:     Syncope   History:         Patient has no prior history of Echocardiogram examinations.                  CAD, Arrythmias:Atrial Fibrillation; Risk Factors:Hypertension.                    Mitral Valve: unknown size prosthetic annuloplasty ring valve                  is present in the mitral position. Procedure Date: 2002 [facility].   Sonographer:     Cleatis Polka Referring Phys:  [id number] Edsel Petrin M PATEL Diagnosing Phys: Weston Brass MD  IMPRESSIONS   1. Left ventricular ejection fraction, by estimation, is 55 to 60%. The left ventricle has normal function. The left ventricle has no regional wall motion abnormalities. There is mild left ventricular hypertrophy. Left ventricular diastolic parameters  are indeterminate.  2. Right ventricular  systolic function is normal. The right ventricular size is mildly enlarged. There is moderately elevated pulmonary artery systolic pressure. The estimated right ventricular systolic pressure is 48.4 mmHg.  3. Left atrial size was severely dilated.  4. Right atrial size was severely dilated.  5. The mitral valve has been repaired/replaced. Mild mitral valve regurgitation. The mean mitral valve gradient is 3.6 mmHg with average heart rate of 70 bpm. There is a unknown size prosthetic annuloplasty ring present in the mitral position. Procedure  Date: 2002 [facility]. Echo findings are consistent with normal structure and function of the mitral valve prosthesis.  6. Tricuspid valve regurgitation is severe.  7. The aortic valve is abnormal. There is mild calcification of the aortic valve. Aortic valve regurgitation is moderate. No aortic stenosis is present.  8. The inferior vena cava is dilated in size with <50% respiratory variability, suggesting right atrial  pressure of 15 mmHg.  FINDINGS  Left Ventricle: Left ventricular ejection fraction, by estimation, is 55 to 60%. The left ventricle has normal function. The left ventricle has no regional wall motion abnormalities. The left ventricular internal cavity size was normal in size. There is  mild left ventricular hypertrophy. Abnormal (paradoxical) septal motion consistent with post-operative status. Left ventricular diastolic parameters are indeterminate.  Right Ventricle: The right ventricular size is mildly enlarged. No increase in right ventricular wall thickness. Right ventricular systolic function is normal. There is moderately elevated pulmonary artery systolic pressure. The tricuspid regurgitant  velocity is 2.89 m/s, and with an assumed right atrial pressure of 15 mmHg, the estimated right ventricular systolic pressure is 48.4 mmHg.  Left Atrium: Left atrial size was severely dilated.  Right Atrium: Right atrial size was severely  dilated.  Pericardium: There is no evidence of pericardial effusion.  Mitral Valve: The mitral valve has been repaired/replaced. Mild mitral valve regurgitation. There is a unknown size prosthetic annuloplasty ring present in the mitral position. Procedure Date: 2002 [facility]. Echo findings are consistent with normal structure  and function of the mitral valve prosthesis. The mean mitral valve gradient is 3.6 mmHg with average heart rate of 70 bpm.  Tricuspid Valve: The tricuspid valve is normal in structure. Tricuspid valve regurgitation is severe. No evidence of tricuspid stenosis.  Aortic Valve: The aortic valve is abnormal. There is mild calcification of the aortic valve. Aortic valve regurgitation is moderate. Aortic regurgitation PHT measures 558 msec. No aortic stenosis is present. Aortic valve peak gradient measures 9.5 mmHg.  Pulmonic Valve: The pulmonic valve was normal in structure. Pulmonic valve regurgitation is mild. No evidence of pulmonic stenosis.  Aorta: The aortic root is normal in size and structure. Ascending aorta measurements are within normal limits for age when indexed to body surface area.  Venous: The inferior vena cava is dilated in size with less than 50% respiratory variability, suggesting right atrial pressure of 15 mmHg.  IAS/Shunts: No atrial level shunt detected by color flow Doppler.    LEFT VENTRICLE PLAX 2D LVIDd:         3.90 cm      Diastology LVIDs:         3.00 cm      LV e' medial:    6.31 cm/s LV PW:         1.10 cm      LV E/e' medial:  25.0 LV IVS:        1.10 cm      LV e' lateral:   7.51 cm/s LVOT diam:     2.20 cm      LV E/e' lateral: 21.0 LV SV:         72 LV SV Index:   36 LVOT Area:     3.80 cm   LV Volumes (MOD) LV vol d, MOD A2C: 107.0 ml LV vol d, MOD A4C: 120.0 ml LV vol s, MOD A2C: 45.4 ml LV vol s, MOD A4C: 49.9 ml LV SV MOD A2C:     61.6 ml LV SV MOD A4C:     120.0 ml LV SV MOD BP:      68.1 ml  RIGHT VENTRICLE             IVC RV Basal diam:  4.30 cm    IVC diam: 2.20 cm RV S prime:     9.25 cm/s TAPSE (M-mode): 1.4 cm  LEFT ATRIUM  Index        RIGHT ATRIUM           Index LA diam:        4.50 cm 2.28 cm/m   RA Area:     18.70 cm LA Vol (A2C):   89.3 ml 45.17 ml/m  RA Volume:   48.60 ml  24.58 ml/m LA Vol (A4C):   72.5 ml 36.67 ml/m LA Biplane Vol: 85.0 ml 42.99 ml/m  AORTIC VALVE AV Area (Vmax): 2.57 cm AV Vmax:        154.00 cm/s AV Peak Grad:   9.5 mmHg LVOT Vmax:      104.00 cm/s LVOT Vmean:     72.400 cm/s LVOT VTI:       0.189 m AI PHT:         558 msec   AORTA Ao Root diam: 3.70 cm Ao Asc diam:  3.40 cm  MITRAL VALVE                TRICUSPID VALVE MV Area (PHT): 5.09 cm     TR Peak grad:   33.4 mmHg MV Mean grad:  3.6 mmHg     TR Vmax:        289.00 cm/s MV Decel Time: 149 msec MV E velocity: 158.00 cm/s  SHUNTS                             Systemic VTI:  0.19 m                             Systemic Diam: 2.20 cm  Weston Brass MD Electronically signed by Weston Brass MD Signature Date/Time: 03/15/2022/4:12:04 PM      Final (Updated)   CT ABDOMEN PELVIS W CONTRAST CLINICAL DATA:  Syncope, hypotension, abdominal pain.  EXAM: CT ANGIOGRAPHY CHEST  CT ABDOMEN AND PELVIS WITH CONTRAST  TECHNIQUE: Multidetector CT imaging of the chest was performed using the standard protocol during bolus administration of intravenous contrast. Multiplanar CT image reconstructions and MIPs were obtained to evaluate the vascular anatomy. Multidetector CT imaging of the abdomen and pelvis was performed using the standard protocol during bolus administration of intravenous contrast.  RADIATION DOSE REDUCTION: This exam was performed according to the departmental dose-optimization program which includes automated exposure control, adjustment of the mA and/or kV according to patient size and/or use of iterative reconstruction technique.  CONTRAST:  OMNIPAQUE IOHEXOL 350  MG/ML SOLN  COMPARISON:  Chest radiograph dated 03/14/2022  FINDINGS: CTA CHEST FINDINGS  Cardiovascular: Satisfactory opacification of the bilateral pulmonary arteries to the segmental level. No evidence of pulmonary embolism.  Study is not tailored for evaluation of the thoracic aorta. No evidence of thoracic aortic aneurysm. Atherosclerotic calcifications of the arch.  Cardiomegaly.  No pericardial effusion.  Prosthetic mitral valve.  Coronary atherosclerosis of the LAD.  Mediastinum/Nodes: No suspicious mediastinal lymphadenopathy.  Visualized thyroid is unremarkable.  Lungs/Pleura: Eventration of left hemidiaphragm with associated lingular and left lower lobe atelectasis.  Mild atelectasis in the posterior upper lobes and medial right lower lobe.  No focal consolidation.  Mild centrilobular and paraseptal emphysematous changes, upper lung predominant.  Evaluation lung parenchyma is constrained by respiratory motion. Within that constraint, there are no suspicious pulmonary nodules.  No pleural effusion or pneumothorax.  Musculoskeletal: Mild degenerative changes of the lower thoracic spine. Vertebral hemangioma at T3. Median sternotomy.  Review of  the MIP images confirms the above findings.  CT ABDOMEN and PELVIS FINDINGS  Hepatobiliary: Scattered hepatic cysts measuring up to 19 mm in segment 4.  Gallbladder is unremarkable. No intrahepatic or extrahepatic ductal dilatation.  Pancreas: Mild parenchymal atrophy.  Spleen: Within normal limits.  Adrenals/Urinary Tract: Adrenal glands are within normal limits.  Bilateral renal cysts, measuring up to 3.2 cm in the right upper kidney (series 5/image 23), benign (Bosniak II). No hydronephrosis.  Bladder is within normal limits, noting streak artifact.  Stomach/Bowel: Stomach is within normal limits.  No evidence of bowel obstruction.  Appendix is not discretely visualized.  Scattered colonic  diverticulosis, without evidence of diverticulitis.  Vascular/Lymphatic: No evidence of abdominal aortic aneurysm.  Atherosclerotic calcifications of the abdominal aorta and branch vessels.  No suspicious abdominopelvic lymphadenopathy.  Reproductive: Prostatomegaly, suggesting BPH.  Other: No abdominopelvic ascites.  Mild presacral fluid/stranding (series 3/image 63).  Musculoskeletal: Right hip arthroplasty, without evidence of complication. Degenerative changes of the visualized thoracolumbar spine. Median sternotomy.  Review of the MIP images confirms the above findings.  IMPRESSION: No evidence of pulmonary embolism. No evidence of acute cardiopulmonary disease.  No acute findings in the abdomen/pelvis.  Additional ancillary findings as above.  Electronically Signed   By: Charline Bills M.D.   On: 03/15/2022 00:39 CT Angio Chest PE W and/or Wo Contrast CLINICAL DATA:  Syncope, hypotension, abdominal pain.  EXAM: CT ANGIOGRAPHY CHEST  CT ABDOMEN AND PELVIS WITH CONTRAST  TECHNIQUE: Multidetector CT imaging of the chest was performed using the standard protocol during bolus administration of intravenous contrast. Multiplanar CT image reconstructions and MIPs were obtained to evaluate the vascular anatomy. Multidetector CT imaging of the abdomen and pelvis was performed using the standard protocol during bolus administration of intravenous contrast.  RADIATION DOSE REDUCTION: This exam was performed according to the departmental dose-optimization program which includes automated exposure control, adjustment of the mA and/or kV according to patient size and/or use of iterative reconstruction technique.  CONTRAST:  OMNIPAQUE IOHEXOL 350 MG/ML SOLN  COMPARISON:  Chest radiograph dated 03/14/2022  FINDINGS: CTA CHEST FINDINGS  Cardiovascular: Satisfactory opacification of the bilateral pulmonary arteries to the segmental level. No evidence of  pulmonary embolism.  Study is not tailored for evaluation of the thoracic aorta. No evidence of thoracic aortic aneurysm. Atherosclerotic calcifications of the arch.  Cardiomegaly.  No pericardial effusion.  Prosthetic mitral valve.  Coronary atherosclerosis of the LAD.  Mediastinum/Nodes: No suspicious mediastinal lymphadenopathy.  Visualized thyroid is unremarkable.  Lungs/Pleura: Eventration of left hemidiaphragm with associated lingular and left lower lobe atelectasis.  Mild atelectasis in the posterior upper lobes and medial right lower lobe.  No focal consolidation.  Mild centrilobular and paraseptal emphysematous changes, upper lung predominant.  Evaluation lung parenchyma is constrained by respiratory motion. Within that constraint, there are no suspicious pulmonary nodules.  No pleural effusion or pneumothorax.  Musculoskeletal: Mild degenerative changes of the lower thoracic spine. Vertebral hemangioma at T3. Median sternotomy.  Review of the MIP images confirms the above findings.  CT ABDOMEN and PELVIS FINDINGS  Hepatobiliary: Scattered hepatic cysts measuring up to 19 mm in segment 4.  Gallbladder is unremarkable. No intrahepatic or extrahepatic ductal dilatation.  Pancreas: Mild parenchymal atrophy.  Spleen: Within normal limits.  Adrenals/Urinary Tract: Adrenal glands are within normal limits.  Bilateral renal cysts, measuring up to 3.2 cm in the right upper kidney (series 5/image 23), benign (Bosniak II). No hydronephrosis.  Bladder is within normal limits, noting streak artifact.  Stomach/Bowel: Stomach  is within normal limits.  No evidence of bowel obstruction.  Appendix is not discretely visualized.  Scattered colonic diverticulosis, without evidence of diverticulitis.  Vascular/Lymphatic: No evidence of abdominal aortic aneurysm.  Atherosclerotic calcifications of the abdominal aorta and branch vessels.  No suspicious  abdominopelvic lymphadenopathy.  Reproductive: Prostatomegaly, suggesting BPH.  Other: No abdominopelvic ascites.  Mild presacral fluid/stranding (series 3/image 63).  Musculoskeletal: Right hip arthroplasty, without evidence of complication. Degenerative changes of the visualized thoracolumbar spine. Median sternotomy.  Review of the MIP images confirms the above findings.  IMPRESSION: No evidence of pulmonary embolism. No evidence of acute cardiopulmonary disease.  No acute findings in the abdomen/pelvis.  Additional ancillary findings as above.  Electronically Signed   By: Charline Bills M.D.   On: 03/15/2022 00:39 CT Head Wo Contrast CLINICAL DATA:  Mental status change  EXAM: CT HEAD WITHOUT CONTRAST  TECHNIQUE: Contiguous axial images were obtained from the base of the skull through the vertex without intravenous contrast.  RADIATION DOSE REDUCTION: This exam was performed according to the departmental dose-optimization program which includes automated exposure control, adjustment of the mA and/or kV according to patient size and/or use of iterative reconstruction technique.  COMPARISON:  CT brain 10/31/2017 report, 05/10/2016  FINDINGS: Brain: No acute territorial infarction, hemorrhage or intracranial mass. Chronic right MCA infarct with encephalomalacia and ex vacuo dilatation of the right ventricle. Small chronic appearing infarct within the left posterior parietal white matter. Atrophy. Mild chronic small vessel ischemic changes of the white matter.  Vascular: No hyperdense vessels.  No unexpected calcification  Skull: Normal. Negative for fracture or focal lesion.  Sinuses/Orbits: No acute finding.  Other: None.  IMPRESSION: 1. No CT evidence for acute intracranial abnormality. 2. Chronic right MCA infarct. Atrophy and mild chronic small vessel ischemic changes of the white matter.  Electronically Signed   By: Jasmine Pang M.D.   On:  03/15/2022 00:14 Note: Reviewed        Physical Exam  General appearance: Well nourished, well developed, and well hydrated. In no apparent acute distress Mental status: Alert, oriented x 3 (person, place, & time)       Respiratory: No evidence of acute respiratory distress Eyes: PERLA Vitals: BP (!) 105/46   Pulse 67   Temp (!) 97 F (36.1 C)   Resp 16   Ht 5\' 5"  (1.651 m)   Wt 190 lb (86.2 kg)   SpO2 98%   BMI 31.62 kg/m  BMI: Estimated body mass index is 31.62 kg/m as calculated from the following:   Height as of this encounter: 5\' 5"  (1.651 m).   Weight as of this encounter: 190 lb (86.2 kg). Ideal: Ideal body weight: 61.5 kg (135 lb 9.3 oz) Adjusted ideal body weight: 71.4 kg (157 lb 5.6 oz)  Patient is severely deconditioned, severe thoracic kyphosis and scoliosis.  Presents in wheelchair. Severe deconditioning.  Assessment   Diagnosis  1. Lumbar spondylosis   2. Chronic pain syndrome   3. Chronic radicular lumbar pain   4. Lumbar degenerative disc disease   5. Bilateral primary osteoarthritis of knee   6. Spinal stenosis, lumbar region, with neurogenic claudication        Plan of Care    Brian [name] has a current medication list which includes the following long-term medication(s): cetirizine, eliquis, metoprolol succinate, pantoprazole, pravastatin, quetiapine, sertraline, and pregabalin.  Pharmacotherapy (Medications Ordered): Meds ordered this encounter  Medications   oxyCODONE-acetaminophen (PERCOCET) 7.5-325 MG tablet    Sig:  Take 1 tablet by mouth every 12 (twelve) hours as needed for severe pain.    Dispense:  60 tablet    Refill:  0   DISCONTD: pregabalin (LYRICA) 25 MG capsule    Sig: Take 1 capsule (25 mg total) by mouth at bedtime.    Dispense:  30 capsule    Refill:  5   oxyCODONE-acetaminophen (PERCOCET) 7.5-325 MG tablet    Sig: Take 1 tablet by mouth every 12 (twelve) hours as needed for severe pain.    Dispense:  60 tablet     Refill:  0   oxyCODONE-acetaminophen (PERCOCET) 7.5-325 MG tablet    Sig: Take 1 tablet by mouth every 12 (twelve) hours as needed for severe pain.    Dispense:  60 tablet    Refill:  0   pregabalin (LYRICA) 25 MG capsule    Sig: Take 1 capsule (25 mg total) by mouth at bedtime.    Dispense:  30 capsule    Refill:  5   Orders:  Orders Placed This Encounter  Procedures   ToxASSURE Select 13 (MW), Urine    Volume: 30 ml(s). Minimum 3 ml of urine is needed. Document temperature of fresh sample. Indications: Long term (current) use of opiate analgesic [phone])    Order Specific Question:   Release to patient    Answer:   Immediate   Follow-up plan:   Return in about 3 months (around 06/27/2023) for Medication Management, in person.    Recent Visits No visits were found meeting these conditions. Showing recent visits within past 90 days and meeting all other requirements Today's Visits Date Type Provider Dept  03/27/23 Office Visit Edward Jolly, MD [facility]  Showing today's visits and meeting all other requirements Future Appointments No visits were found meeting these conditions. Showing future appointments within next 90 days and meeting all other requirements  I discussed the assessment and treatment plan with the patient. The patient was provided an opportunity to ask questions and all were answered. The patient agreed with the plan and demonstrated an understanding of the instructions.  Patient advised to call back or seek an in-person evaluation if the symptoms or condition worsens.  Duration of encounter: .  Total time on encounter, as per AMA guidelines included both the face-to-face and non-face-to-face time personally spent by the physician and/or other qualified health care professional(s) on the day of the encounter (includes time in activities that require the physician or other qualified health care professional and does not include time in  activities normally performed by clinical staff). Physician's time may include the following activities when performed: preparing to see the patient (eg, review of tests, pre-charting review of records) obtaining and/or reviewing separately obtained history performing a medically appropriate examination and/or evaluation counseling and educating the patient/family/caregiver ordering medications, tests, or procedures referring and communicating with other health care professionals (when not separately reported) documenting clinical information in the electronic or other health record independently interpreting results (not separately reported) and communicating results to the patient/ family/caregiver care coordination (not separately reported)  Note by: Edward Jolly, MD Date: 03/27/2023; Time: 3:31 PM

## 2023-03-29 DIAGNOSIS — I4819 Other persistent atrial fibrillation: Secondary | ICD-10-CM | POA: Diagnosis not present

## 2023-03-29 DIAGNOSIS — L89023 Pressure ulcer of left elbow, stage 3: Secondary | ICD-10-CM | POA: Diagnosis not present

## 2023-03-29 DIAGNOSIS — Z7901 Long term (current) use of anticoagulants: Secondary | ICD-10-CM | POA: Diagnosis not present

## 2023-03-29 DIAGNOSIS — I69354 Hemiplegia and hemiparesis following cerebral infarction affecting left non-dominant side: Secondary | ICD-10-CM | POA: Diagnosis not present

## 2023-03-29 DIAGNOSIS — I119 Hypertensive heart disease without heart failure: Secondary | ICD-10-CM | POA: Diagnosis not present

## 2023-03-29 DIAGNOSIS — I2510 Atherosclerotic heart disease of native coronary artery without angina pectoris: Secondary | ICD-10-CM | POA: Diagnosis not present

## 2023-03-30 DIAGNOSIS — Z7401 Bed confinement status: Secondary | ICD-10-CM | POA: Diagnosis not present

## 2023-03-30 DIAGNOSIS — M1990 Unspecified osteoarthritis, unspecified site: Secondary | ICD-10-CM | POA: Diagnosis not present

## 2023-03-30 DIAGNOSIS — L89023 Pressure ulcer of left elbow, stage 3: Secondary | ICD-10-CM | POA: Diagnosis not present

## 2023-03-30 DIAGNOSIS — I4819 Other persistent atrial fibrillation: Secondary | ICD-10-CM | POA: Diagnosis not present

## 2023-03-30 DIAGNOSIS — G894 Chronic pain syndrome: Secondary | ICD-10-CM | POA: Diagnosis not present

## 2023-03-30 DIAGNOSIS — I69354 Hemiplegia and hemiparesis following cerebral infarction affecting left non-dominant side: Secondary | ICD-10-CM | POA: Diagnosis not present

## 2023-03-30 DIAGNOSIS — I2510 Atherosclerotic heart disease of native coronary artery without angina pectoris: Secondary | ICD-10-CM | POA: Diagnosis not present

## 2023-03-30 DIAGNOSIS — I119 Hypertensive heart disease without heart failure: Secondary | ICD-10-CM | POA: Diagnosis not present

## 2023-03-30 DIAGNOSIS — F419 Anxiety disorder, unspecified: Secondary | ICD-10-CM | POA: Diagnosis not present

## 2023-03-30 DIAGNOSIS — Z7901 Long term (current) use of anticoagulants: Secondary | ICD-10-CM | POA: Diagnosis not present

## 2023-03-31 ENCOUNTER — Other Ambulatory Visit: Payer: Self-pay | Admitting: Family Medicine

## 2023-03-31 DIAGNOSIS — G4709 Other insomnia: Secondary | ICD-10-CM

## 2023-04-02 DIAGNOSIS — I4819 Other persistent atrial fibrillation: Secondary | ICD-10-CM | POA: Diagnosis not present

## 2023-04-02 DIAGNOSIS — Z7901 Long term (current) use of anticoagulants: Secondary | ICD-10-CM | POA: Diagnosis not present

## 2023-04-02 DIAGNOSIS — I119 Hypertensive heart disease without heart failure: Secondary | ICD-10-CM | POA: Diagnosis not present

## 2023-04-02 DIAGNOSIS — L89023 Pressure ulcer of left elbow, stage 3: Secondary | ICD-10-CM | POA: Diagnosis not present

## 2023-04-02 DIAGNOSIS — I69354 Hemiplegia and hemiparesis following cerebral infarction affecting left non-dominant side: Secondary | ICD-10-CM | POA: Diagnosis not present

## 2023-04-02 DIAGNOSIS — I2510 Atherosclerotic heart disease of native coronary artery without angina pectoris: Secondary | ICD-10-CM | POA: Diagnosis not present

## 2023-04-04 DIAGNOSIS — I2510 Atherosclerotic heart disease of native coronary artery without angina pectoris: Secondary | ICD-10-CM | POA: Diagnosis not present

## 2023-04-04 DIAGNOSIS — Z7901 Long term (current) use of anticoagulants: Secondary | ICD-10-CM | POA: Diagnosis not present

## 2023-04-04 DIAGNOSIS — I69354 Hemiplegia and hemiparesis following cerebral infarction affecting left non-dominant side: Secondary | ICD-10-CM | POA: Diagnosis not present

## 2023-04-04 DIAGNOSIS — I4819 Other persistent atrial fibrillation: Secondary | ICD-10-CM | POA: Diagnosis not present

## 2023-04-04 DIAGNOSIS — I119 Hypertensive heart disease without heart failure: Secondary | ICD-10-CM | POA: Diagnosis not present

## 2023-04-04 DIAGNOSIS — L89023 Pressure ulcer of left elbow, stage 3: Secondary | ICD-10-CM | POA: Diagnosis not present

## 2023-04-05 ENCOUNTER — Encounter (HOSPITAL_BASED_OUTPATIENT_CLINIC_OR_DEPARTMENT_OTHER): Payer: Self-pay | Admitting: Cardiovascular Disease

## 2023-04-05 DIAGNOSIS — Z7901 Long term (current) use of anticoagulants: Secondary | ICD-10-CM | POA: Diagnosis not present

## 2023-04-05 DIAGNOSIS — I4819 Other persistent atrial fibrillation: Secondary | ICD-10-CM | POA: Diagnosis not present

## 2023-04-05 DIAGNOSIS — L89023 Pressure ulcer of left elbow, stage 3: Secondary | ICD-10-CM | POA: Diagnosis not present

## 2023-04-05 DIAGNOSIS — I2510 Atherosclerotic heart disease of native coronary artery without angina pectoris: Secondary | ICD-10-CM | POA: Diagnosis not present

## 2023-04-05 DIAGNOSIS — I119 Hypertensive heart disease without heart failure: Secondary | ICD-10-CM | POA: Diagnosis not present

## 2023-04-05 DIAGNOSIS — I69354 Hemiplegia and hemiparesis following cerebral infarction affecting left non-dominant side: Secondary | ICD-10-CM | POA: Diagnosis not present

## 2023-04-05 NOTE — Telephone Encounter (Signed)
Called patient at request of mychart.   Patient states he has been short of breath. He states it is happening fairly often. He is bed bound so only at rest. Happens randomly. He might have gained weight recently, but he isn't sure. He does not have any swelling. He states his blood pressure has been good, but he isn't sure what the numbers are. He states he have been getting short of breath for about 3 weeks. He denies any changes to his medication.   Patient scheduled

## 2023-04-09 DIAGNOSIS — L89023 Pressure ulcer of left elbow, stage 3: Secondary | ICD-10-CM | POA: Diagnosis not present

## 2023-04-09 DIAGNOSIS — I119 Hypertensive heart disease without heart failure: Secondary | ICD-10-CM | POA: Diagnosis not present

## 2023-04-09 DIAGNOSIS — I4819 Other persistent atrial fibrillation: Secondary | ICD-10-CM | POA: Diagnosis not present

## 2023-04-09 DIAGNOSIS — N401 Enlarged prostate with lower urinary tract symptoms: Secondary | ICD-10-CM | POA: Diagnosis not present

## 2023-04-09 DIAGNOSIS — R3914 Feeling of incomplete bladder emptying: Secondary | ICD-10-CM | POA: Diagnosis not present

## 2023-04-09 DIAGNOSIS — I2510 Atherosclerotic heart disease of native coronary artery without angina pectoris: Secondary | ICD-10-CM | POA: Diagnosis not present

## 2023-04-09 DIAGNOSIS — I69354 Hemiplegia and hemiparesis following cerebral infarction affecting left non-dominant side: Secondary | ICD-10-CM | POA: Diagnosis not present

## 2023-04-09 DIAGNOSIS — Z7901 Long term (current) use of anticoagulants: Secondary | ICD-10-CM | POA: Diagnosis not present

## 2023-04-09 DIAGNOSIS — R300 Dysuria: Secondary | ICD-10-CM | POA: Diagnosis not present

## 2023-04-09 DIAGNOSIS — N3020 Other chronic cystitis without hematuria: Secondary | ICD-10-CM | POA: Diagnosis not present

## 2023-04-10 ENCOUNTER — Telehealth: Payer: Self-pay | Admitting: Cardiovascular Disease

## 2023-04-10 DIAGNOSIS — L89023 Pressure ulcer of left elbow, stage 3: Secondary | ICD-10-CM | POA: Diagnosis not present

## 2023-04-10 DIAGNOSIS — I2510 Atherosclerotic heart disease of native coronary artery without angina pectoris: Secondary | ICD-10-CM | POA: Diagnosis not present

## 2023-04-10 DIAGNOSIS — I69354 Hemiplegia and hemiparesis following cerebral infarction affecting left non-dominant side: Secondary | ICD-10-CM | POA: Diagnosis not present

## 2023-04-10 DIAGNOSIS — I4819 Other persistent atrial fibrillation: Secondary | ICD-10-CM | POA: Diagnosis not present

## 2023-04-10 DIAGNOSIS — Z7901 Long term (current) use of anticoagulants: Secondary | ICD-10-CM | POA: Diagnosis not present

## 2023-04-10 DIAGNOSIS — I119 Hypertensive heart disease without heart failure: Secondary | ICD-10-CM | POA: Diagnosis not present

## 2023-04-10 NOTE — Telephone Encounter (Signed)
Pt c/o Shortness Of Breath: STAT if SOB developed within the last 24 hours or pt is noticeably SOB on the phone  1. Are you currently SOB (can you hear that pt is SOB on the phone)? No  2. How long have you been experiencing SOB? A couple days   3. Are you SOB when sitting or when up moving around? Lying down   4. Are you currently experiencing any other symptoms? No

## 2023-04-10 NOTE — Telephone Encounter (Signed)
Returned call to [place] at [facility],   He has been having some shortness of breath, he is bedbound. RN says it is not bad, more on an intermittent bases. He did have an episode like this mid july. He is currently scheduled for 9/3.   He does refuse to go to the ED even when it was really bad in July. RN states symptoms are not worsening but did want to ensure that he had an appointment scheduled.   Advised to alert [place] to worsening symptoms.

## 2023-04-11 ENCOUNTER — Emergency Department (HOSPITAL_COMMUNITY): Payer: Medicare Other

## 2023-04-11 ENCOUNTER — Emergency Department (HOSPITAL_COMMUNITY)
Admission: EM | Admit: 2023-04-11 | Discharge: 2023-04-11 | Disposition: A | Discharge status: Home / Self Care | Payer: Medicare Other | Attending: Emergency Medicine | Admitting: Emergency Medicine

## 2023-04-11 ENCOUNTER — Encounter (HOSPITAL_COMMUNITY): Payer: Self-pay | Admitting: Emergency Medicine

## 2023-04-11 ENCOUNTER — Other Ambulatory Visit: Payer: Self-pay

## 2023-04-11 DIAGNOSIS — I447 Left bundle-branch block, unspecified: Secondary | ICD-10-CM | POA: Diagnosis not present

## 2023-04-11 DIAGNOSIS — Z7901 Long term (current) use of anticoagulants: Secondary | ICD-10-CM | POA: Insufficient documentation

## 2023-04-11 DIAGNOSIS — J90 Pleural effusion, not elsewhere classified: Secondary | ICD-10-CM | POA: Insufficient documentation

## 2023-04-11 DIAGNOSIS — I2510 Atherosclerotic heart disease of native coronary artery without angina pectoris: Secondary | ICD-10-CM | POA: Insufficient documentation

## 2023-04-11 DIAGNOSIS — R000 Tachycardia, unspecified: Secondary | ICD-10-CM | POA: Diagnosis not present

## 2023-04-11 DIAGNOSIS — Z79899 Other long term (current) drug therapy: Secondary | ICD-10-CM | POA: Diagnosis not present

## 2023-04-11 DIAGNOSIS — R7989 Other specified abnormal findings of blood chemistry: Secondary | ICD-10-CM | POA: Diagnosis not present

## 2023-04-11 DIAGNOSIS — R0789 Other chest pain: Secondary | ICD-10-CM | POA: Diagnosis not present

## 2023-04-11 DIAGNOSIS — I499 Cardiac arrhythmia, unspecified: Secondary | ICD-10-CM | POA: Diagnosis not present

## 2023-04-11 DIAGNOSIS — R0601 Orthopnea: Secondary | ICD-10-CM

## 2023-04-11 DIAGNOSIS — R109 Unspecified abdominal pain: Secondary | ICD-10-CM | POA: Diagnosis not present

## 2023-04-11 DIAGNOSIS — J9811 Atelectasis: Secondary | ICD-10-CM | POA: Diagnosis not present

## 2023-04-11 DIAGNOSIS — I10 Essential (primary) hypertension: Secondary | ICD-10-CM | POA: Diagnosis not present

## 2023-04-11 DIAGNOSIS — R079 Chest pain, unspecified: Secondary | ICD-10-CM | POA: Diagnosis not present

## 2023-04-11 DIAGNOSIS — I4891 Unspecified atrial fibrillation: Secondary | ICD-10-CM | POA: Diagnosis not present

## 2023-04-11 DIAGNOSIS — I517 Cardiomegaly: Secondary | ICD-10-CM | POA: Diagnosis not present

## 2023-04-11 DIAGNOSIS — R0602 Shortness of breath: Secondary | ICD-10-CM | POA: Diagnosis not present

## 2023-04-11 LAB — CBC
HCT: 35.9 % — ABNORMAL LOW (ref 39.0–52.0)
Hemoglobin: 10.7 g/dL — ABNORMAL LOW (ref 13.0–17.0)
MCH: 26 pg (ref 26.0–34.0)
MCHC: 29.8 g/dL — ABNORMAL LOW (ref 30.0–36.0)
MCV: 87.1 fL (ref 80.0–100.0)
Platelets: 206 10*3/uL (ref 150–400)
RBC: 4.12 MIL/uL — ABNORMAL LOW (ref 4.22–5.81)
RDW: 17.9 % — ABNORMAL HIGH (ref 11.5–15.5)
WBC: 6.8 10*3/uL (ref 4.0–10.5)
nRBC: 0 % (ref 0.0–0.2)

## 2023-04-11 LAB — COMPREHENSIVE METABOLIC PANEL
ALT: 15 U/L (ref 0–44)
AST: 20 U/L (ref 15–41)
Albumin: 3.3 g/dL — ABNORMAL LOW (ref 3.5–5.0)
Alkaline Phosphatase: 61 U/L (ref 38–126)
Anion gap: 10 (ref 5–15)
BUN: 15 mg/dL (ref 8–23)
CO2: 23 mmol/L (ref 22–32)
Calcium: 8.8 mg/dL — ABNORMAL LOW (ref 8.9–10.3)
Chloride: 106 mmol/L (ref 98–111)
Creatinine, Ser: 0.79 mg/dL (ref 0.61–1.24)
GFR, Estimated: >60 mL/min (ref >60)
Glucose, Bld: 80 mg/dL (ref 70–99)
Potassium: 4.3 mmol/L (ref 3.5–5.1)
Sodium: 139 mmol/L (ref 135–145)
Total Bilirubin: 0.5 mg/dL (ref 0.3–1.2)
Total Protein: 6.1 g/dL — ABNORMAL LOW (ref 6.5–8.1)

## 2023-04-11 LAB — TROPONIN I (HIGH SENSITIVITY)
Troponin I (High Sensitivity): 19 ng/L — ABNORMAL HIGH (ref <18)
Troponin I (High Sensitivity): 17 ng/L (ref <18)

## 2023-04-11 LAB — BRAIN NATRIURETIC PEPTIDE: B Natriuretic Peptide: 653.4 pg/mL — ABNORMAL HIGH (ref 0.0–100.0)

## 2023-04-11 MED ORDER — IOHEXOL 350 MG/ML SOLN
75.0000 mL | Freq: Once | INTRAVENOUS | Status: AC | PRN
  Administered 2023-04-11: 75 mL via INTRAVENOUS

## 2023-04-11 MED ORDER — FUROSEMIDE 10 MG/ML IJ SOLN
20.0000 mg | Freq: Once | INTRAMUSCULAR | Status: AC
  Administered 2023-04-11: 20 mg via INTRAVENOUS
  Filled 2023-04-11: qty 2

## 2023-04-11 NOTE — ED Triage Notes (Signed)
Per [facility] pt coming from home, had home aide at house. Began c/o pain to left flank x 3 days ago along with intermittent shortness of breath. Today began having pain to left side of head. Denies any injury. Reports having similar episode last month but did not want to be evaluated. Has a condom cath placed on arrival.

## 2023-04-11 NOTE — ED Notes (Signed)
PT requested the chaplain. Secretary called for the chaplain.

## 2023-04-11 NOTE — ED Notes (Signed)
Called lab. Added on BNP to the labs already down there.

## 2023-04-11 NOTE — ED Notes (Signed)
2nd trop not pulled on time as PT was in a scanner and not in the room.

## 2023-04-11 NOTE — Progress Notes (Signed)
Per  family request and spiritual consult . Chaplain visited, Prayed with patient and family.  And provided emotional support.  Will follow as needed.  Brian [name], [place], [facility], Pager [phone]

## 2023-04-11 NOTE — ED Provider Notes (Addendum)
[facility] EMERGENCY DEPARTMENT AT [facility] Provider Note   CSN: [id number] Arrival date & time: 04/11/23  1207     History  Chief Complaint  Patient presents with   Shortness of Breath    [name] is a 89 y.o. male with history of CVA with dysarthria and [name], Brian [name], Brian [name], Brian [name], Brian [name], Brian [name] [facility] pt coming from home, accompanied by his son and health aide. Began c/o pain to left chest pain x 3 days ago along with intermittent shortness of breath.  States the pain is pleuritic and the shortness of breath is worse with lying down.  Is coughing but is not able to cough anything up.  Has sensation of mucus in the chest.  Today began having pain to left side of head which is not hurting now but was hurting earlier. Denies any trauma or falls. Reports having similar episode last month but did not want to be evaluated.  He and home health aide deny any fever/chills, abdominal pain but has had some nausea over the last 3 days.  Denies any urinary symptoms but comes in with a condom cath.  Denies any diarrhea or constipation.  No history of COPD or asthma.  Echo from 02/2022 shows normal LVEF and RV function, moderately elevated pulm artery pressure, dilated atria, no aortic stenosis, w replaced mitral valve.   Past Medical History:  Diagnosis Date   Brian    Atrial flutter (HCC)    BPH (benign prostatic hypertrophy)    Epistaxis    Hemorrhage of gastrointestinal tract, unspecified    History of open heart surgery    Hypertension    Obesity (BMI 30.0-34.9) 06/29/2020   Osteoarthrosis, unspecified whether generalized or localized, unspecified site    Personal history of venous thrombosis and embolism    Rosacea    Stroke (HCC)    TIA (transient ischemic attack)        Home Medications Prior to Admission medications   Medication Sig Start Date End Date Taking? Authorizing Provider  ALPRAZolam (XANAX) 0.25 MG tablet Take 0.25 mg  by mouth 2 (two) times daily as needed for anxiety.   Yes [provider]  cetirizine (ZYRTEC) 10 MG tablet TAKE 1 TABLET(10 MG) BY MOUTH AT BEDTIME Patient taking differently: Take 10 mg by mouth daily. 03/26/23  Yes Mliss Sax, MD  clotrimazole (LOTRIMIN) 1 % cream Apply 1 Application topically 2 (two) times daily.   Yes [provider]  ELIQUIS 2.5 MG TABS tablet TAKE 1 TABLET(2.5 MG) BY MOUTH TWICE DAILY Patient taking differently: Take 2.5 mg by mouth 2 (two) times daily. 03/18/23  Yes Mliss Sax, MD  finasteride (PROSCAR) 5 MG tablet TAKE 1 TABLET(5 MG) BY MOUTH DAILY Patient taking differently: Take 5 mg by mouth daily. 10/02/22  Yes Mliss Sax, MD  fluticasone [facility]) 50 MCG/ACT nasal spray Place 1 spray into both nostrils daily as needed for allergies. 12/24/20  Yes [provider]  hydrocortisone ointment 0.5 % Apply 1 Application topically daily as needed for itching.   Yes [provider]  melatonin 5 MG TABS Take 5 mg by mouth at bedtime as needed (sleep).   Yes [provider]  metoprolol succinate (TOPROL-XL) 25 MG 24 hr tablet TAKE 1 TABLET(25 MG) BY MOUTH DAILY Patient taking differently: Take 25 mg by mouth daily. 06/30/21  Yes Mliss Sax, MD  Multiple Vitamin (MULTIVITAMIN) tablet Take 1 tablet by mouth 2 (two)  times daily.   Yes [provider]  MUPIROCIN EX Apply 1 Application topically daily as needed (wound care).   Yes [provider]  oxyCODONE-acetaminophen (PERCOCET) 7.5-325 MG tablet Take 1 tablet by mouth every 12 (twelve) hours as needed for severe pain. 04/06/23 05/06/23 Yes Edward Jolly, MD  pantoprazole (PROTONIX) 40 MG tablet TAKE 1 TABLET(40 MG) BY MOUTH DAILY Patient taking differently: Take 40 mg by mouth daily. 03/01/23  Yes Mliss Sax, MD  polyethylene glycol powder [facility]) 17 GM/SCOOP powder Mix one scoop with water and take daily  until stools loosen. May repeat as needed. Patient taking differently: Take 17 g by mouth daily as needed for mild constipation. 01/20/20  Yes Mliss Sax, MD  pravastatin (PRAVACHOL) 40 MG tablet TAKE 1 TABLET(40 MG) BY MOUTH DAILY Patient taking differently: Take 40 mg by mouth daily. 10/26/22  Yes Mliss Sax, MD  pregabalin (LYRICA) 25 MG capsule Take 1 capsule (25 mg total) by mouth at bedtime. 03/27/23 09/23/23 Yes Edward Jolly, MD  QUEtiapine (SEROQUEL) 50 MG tablet TAKE 1 TABLET(50 MG) BY MOUTH AT BEDTIME Patient taking differently: Take 50 mg by mouth at bedtime. 04/02/23  Yes Mliss Sax, MD  sertraline (ZOLOFT) 50 MG tablet TAKE 1 TABLET(50 MG) BY MOUTH EVERY MORNING Patient taking differently: Take 50 mg by mouth daily. 12/03/22  Yes Mliss Sax, MD  tamsulosin (FLOMAX) 0.4 MG CAPS capsule TAKE 1 CAPSULE(0.4 MG) BY MOUTH DAILY Patient taking differently: Take 0.4 mg by mouth daily. 08/24/22  Yes Mliss Sax, MD  oxyCODONE-acetaminophen (PERCOCET) 7.5-325 MG tablet Take 1 tablet by mouth every 12 (twelve) hours as needed for severe pain. Patient not taking: Reported on 04/11/2023 05/06/23 06/05/23  Edward Jolly, MD  oxyCODONE-acetaminophen (PERCOCET) 7.5-325 MG tablet Take 1 tablet by mouth every 12 (twelve) hours as needed for severe pain. Patient not taking: Reported on 04/11/2023 06/05/23 07/05/23  Edward Jolly, MD      Allergies    Novocain [procaine hcl], Other, and Penicillins    Review of Systems   Review of Systems A 10 point review of systems was performed and is negative unless otherwise reported in HPI.  Physical Exam Updated Vital Signs BP (!) 152/105   Pulse (!) 116   Temp 98.2 F (36.8 C) (Oral)   Resp 14   Ht 5\' 5"  (1.651 m)   Wt 86.2 kg   SpO2 100%   BMI 31.62 kg/m  Physical Exam General: Chronically ill-appearing elderly male, sitting in bed.  HEENT: PERRLA, Sclera anicteric, MMM, trachea midline.   Cardiology: Irregularly irregular rate, no murmurs/rubs/gallops. BL radial and DP pulses equal bilaterally. No TTP to chest wall, no crepitus or deformities. Resp: Normal respiratory rate and effort. CTAB, no wheezes, rhonchi, crackles.  Abd: Soft, non-tender, non-distended. No rebound tenderness or guarding.  GU: Deferred. MSK: No peripheral edema or signs of trauma. Extremities without deformity or TTP. No cyanosis or clubbing. Skin: warm, dry.  Neuro: A&Ox4, CNs II-XII grossly intact. MAEs. Sensation grossly intact.  Psych: Normal mood and affect.   ED Results / Procedures / Treatments   Labs (all labs ordered are listed, but only abnormal results are displayed) Labs Reviewed  CBC - Abnormal; Notable for the following components:      Result Value   RBC 4.12 (*)    Hemoglobin 10.7 (*)    HCT 35.9 (*)    MCHC 29.8 (*)    RDW 17.9 (*)    All other components  within normal limits  COMPREHENSIVE METABOLIC PANEL - Abnormal; Notable for the following components:   Calcium 8.8 (*)    Total Protein 6.1 (*)    Albumin 3.3 (*)    All other components within normal limits  BRAIN NATRIURETIC PEPTIDE - Abnormal; Notable for the following components:   B Natriuretic Peptide 653.4 (*)    All other components within normal limits  TROPONIN I (HIGH SENSITIVITY) - Abnormal; Notable for the following components:   Troponin I (High Sensitivity) 19 (*)    All other components within normal limits  TROPONIN I (HIGH SENSITIVITY)    EKG EKG Interpretation Date/Time:  Wednesday April 11 2023 12:18:20 EDT Ventricular Rate:  72 PR Interval:    QRS Duration:  147 QT Interval:  417 QTC Calculation: 457 R Axis:   -31  Text Interpretation: Atrial fibrillation Left bundle branch block Confirmed by Vivi Barrack [phone]) on 04/11/2023 12:24:30 PM  Radiology CT Angio Chest PE W and/or Wo Contrast  Result Date: 04/11/2023 CLINICAL DATA:  Pulmonary embolism (PE) suspected, high prob. Intermittent  shortness of breath. Pain to left flank. No known injury. EXAM: CT ANGIOGRAPHY CHEST WITH CONTRAST TECHNIQUE: Multidetector CT imaging of the chest was performed using the standard protocol during bolus administration of intravenous contrast. Multiplanar CT image reconstructions and MIPs were obtained to evaluate the vascular anatomy. RADIATION DOSE REDUCTION: This exam was performed according to the departmental dose-optimization program which includes automated exposure control, adjustment of the mA and/or kV according to patient size and/or use of iterative reconstruction technique. CONTRAST:  75mL OMNIPAQUE IOHEXOL 350 MG/ML SOLN COMPARISON:  CT angiography chest from 03/15/2022. FINDINGS: Cardiovascular: No evidence of embolism to the proximal subsegmental pulmonary artery level. Mild cardiomegaly. No pericardial effusion. No aortic aneurysm. There are minimal coronary artery and aortic artery atherosclerotic vascular calcifications. Prosthetic mitral valve noted. Mediastinum/Nodes: Visualized thyroid gland appears grossly unremarkable. No solid / cystic mediastinal masses. The esophagus is nondistended precluding optimal assessment. No axillary, mediastinal or hilar lymphadenopathy by size criteria. Lungs/Pleura: The central tracheo-bronchial tree is patent. There is small left and trace right pleural effusion. There are associated compressive atelectatic changes in the bilateral lower lobes. No mass, consolidation or pneumothorax. No suspicious lung nodules. Upper Abdomen: Redemonstration of several hypoattenuating structures in the liver without significant interval change. The largest such structure is in the left hepatic lobe, segment 4B measuring up to 1.6 x 2.0 cm. Remaining visualized upper abdominal viscera within normal limits. Musculoskeletal: Sternotomy wires noted. The visualized soft tissues of the chest wall are grossly unremarkable. No suspicious osseous lesions. There are mild multilevel  degenerative changes in the visualized spine. A hemangioma is noted in the T3 vertebral body. Review of the MIP images confirms the above findings. IMPRESSION: 1. No evidence of pulmonary embolism. 2. Small left and trace right pleural effusions with associated compressive atelectatic changes in the bilateral lower lobes. 3. Multiple other nonacute observations, as described above. Aortic Atherosclerosis (ICD10-I70.0). Electronically Signed   By: Jules Schick M.D.   On: 04/11/2023 15:59   DG Chest Port 1 View  Result Date: 04/11/2023 CLINICAL DATA:  CP/SOB EXAM: PORTABLE CHEST 1 VIEW COMPARISON:  CXR 03/14/22 FINDINGS: Cardiomegaly. Small left pleural effusion. No pneumothorax. Status post median sternotomy. No focal airspace opacity. Prominent bilateral interstitial opacities could represent pulmonary venous congestion or atypical infection. No radiographically apparent displaced rib fractures. Visualized upper abdomen is unremarkable. IMPRESSION: 1. Cardiomegaly with small left pleural effusion. 2. Prominent bilateral interstitial opacities could represent pulmonary  venous congestion or atypical infection. Electronically Signed   By: Lorenza Cambridge M.D.   On: 04/11/2023 13:47    Procedures Procedures    Medications Ordered in ED Medications  furosemide (LASIX) injection 20 mg (has no administration in time range)  iohexol (OMNIPAQUE) 350 MG/ML injection 75 mL (75 mLs Intravenous Contrast Given 04/11/23 1431)    ED Course/ Medical Decision Making/ A&P                          Medical Decision Making Amount and/or Complexity of Data Reviewed Labs: ordered. Decision-making details documented in ED Course. Radiology: ordered. Decision-making details documented in ED Course.  Risk Prescription drug management.    This patient presents to the ED for concern of shortness of breath and chest pain, this involves an extensive number of treatment options, and is a complaint that carries with it a  high risk of complications and morbidity.  I considered the following differential and admission for this acute, potentially life threatening condition.   MDM:    For patient's chest pain and intermittent shortness of breath with orthopnea and pleuritic pain, consider ACS or arrhythmia, PE especially given that patient is nonambulatory with shortness of breath and pleuritic pain, viral URI versus pneumonia, pulmonary edema or pleural effusions/heart failure exacerbation given orthopnea however patient does not have any lower extremity edema on exam.  EKG demonstrates rate controlled A-fib with known left bundle branch block, does not meet Sgarbossa criteria. He has no resp distress, is SORA, breathing comfortably with clear lung sounds.  Clinical Course as of 04/11/23 1636  Wed Apr 11, 2023  1350 WBC: 6.8 No leukocytosis  [HN]  1351 DG Chest Port 1 View 1. Cardiomegaly with small left pleural effusion. 2. Prominent bilateral interstitial opacities could represent pulmonary venous congestion or atypical infection.   [HN]  1351 Hemoglobin(!): 10.7 Baseline Brian [HN]  1433 Comprehensive metabolic panel(!) Unremarkable in the context of this patient's presentation.  [HN]  1433 B Natriuretic Peptide(!): 653.4 Increased BNP [HN]  1433 Troponin I (High Sensitivity)(!): 19 [HN]  1539 Troponin I (High Sensitivity): 17 Flat trop 19-17 [HN]  1617 Potassium: 4.3 No PE on CT. Small left and trace R pleural effusion. This is likely the cause of patient's L sided chest pain. K is 4.3. Will give dose of lasix here. As patient is lasix naive will give 20 mg IV lasix. Patient doesn't feel currently SOB and has only small pleural effusion on CT. Will need to f/u with cardiology for repeat echo and evaluation. DC w/ discharge instructions/return precautions. All questions answered to patient's, his son's, and his HHRN's satisfaction.   [HN]    Clinical Course User Index [HN] Loetta Rough, MD     Labs: I Ordered, and personally interpreted labs.  The pertinent results include: Those listed above  Imaging Studies ordered: I ordered imaging studies including chest x-ray, CT PE I independently visualized and interpreted imaging. I agree with the radiologist interpretation  Additional history obtained from chart review, son and health aide at bedside.    Cardiac Monitoring: The patient was maintained on a cardiac monitor.  I personally viewed and interpreted the cardiac monitored which showed an underlying rhythm of: A-fib with left bundle branch block  Reevaluation: After the interventions noted above, I reevaluated the patient and found that they have :stayed the same  Social Determinants of Health:  lives with son  Disposition:  HR wnl on discharge. DC  w/ discharge instructions/return precautions. Instructed to f/u with cardiologist.  Co morbidities that complicate the patient evaluation  Past Medical History:  Diagnosis Date   Brian    Atrial flutter (HCC)    BPH (benign prostatic hypertrophy)    Epistaxis    Hemorrhage of gastrointestinal tract, unspecified    History of open heart surgery    Hypertension    Obesity (BMI 30.0-34.9) 06/29/2020   Osteoarthrosis, unspecified whether generalized or localized, unspecified site    Personal history of venous thrombosis and embolism    Rosacea    Stroke (HCC)    TIA (transient ischemic attack)      Medicines Meds ordered this encounter  Medications   iohexol (OMNIPAQUE) 350 MG/ML injection 75 mL   furosemide (LASIX) injection 20 mg    I have reviewed the patients home medicines and have made adjustments as needed  Problem List / ED Course: Problem List Items Addressed This Visit   None Visit Diagnoses     Small pleural effusion    -  Primary   Orthopnea       Elevated brain natriuretic peptide (BNP) level                       This note was created using dictation software, which may  contain spelling or grammatical errors.    Loetta Rough, MD 04/11/23 1636    Loetta Rough, MD 04/11/23 [phone]

## 2023-04-11 NOTE — Discharge Instructions (Addendum)
Thank you for coming to [facility] Emergency Department. You were seen for shortness of breath. We did an exam, labs, and imaging, and these showed small amount of fluid on the lungs, enlarged heart, and elevated BNP. These findings can indicate heart failure, which means the heart isn't pumping as well as it should. You were given a dose of diuretics in the emergency department. Please call your cardiologist and make a follow up appointment within the next 1-2 weeks.   Do not hesitate to return to the ED or call 911 if you experience: -Worsening symptoms -Lightheadedness, passing out -Fevers/chills -Anything else that concerns you

## 2023-04-12 DIAGNOSIS — I69354 Hemiplegia and hemiparesis following cerebral infarction affecting left non-dominant side: Secondary | ICD-10-CM | POA: Diagnosis not present

## 2023-04-12 DIAGNOSIS — I4819 Other persistent atrial fibrillation: Secondary | ICD-10-CM | POA: Diagnosis not present

## 2023-04-12 DIAGNOSIS — I2510 Atherosclerotic heart disease of native coronary artery without angina pectoris: Secondary | ICD-10-CM | POA: Diagnosis not present

## 2023-04-12 DIAGNOSIS — L89023 Pressure ulcer of left elbow, stage 3: Secondary | ICD-10-CM | POA: Diagnosis not present

## 2023-04-12 DIAGNOSIS — Z7901 Long term (current) use of anticoagulants: Secondary | ICD-10-CM | POA: Diagnosis not present

## 2023-04-12 DIAGNOSIS — I119 Hypertensive heart disease without heart failure: Secondary | ICD-10-CM | POA: Diagnosis not present

## 2023-04-13 ENCOUNTER — Encounter (HOSPITAL_BASED_OUTPATIENT_CLINIC_OR_DEPARTMENT_OTHER): Payer: Self-pay | Admitting: Family

## 2023-04-13 ENCOUNTER — Ambulatory Visit (INDEPENDENT_AMBULATORY_CARE_PROVIDER_SITE_OTHER): Payer: Medicare Other | Admitting: Family

## 2023-04-13 VITALS — BP 116/71 | HR 92 | Ht 65.0 in

## 2023-04-13 DIAGNOSIS — I4819 Other persistent atrial fibrillation: Secondary | ICD-10-CM | POA: Diagnosis not present

## 2023-04-13 DIAGNOSIS — R0609 Other forms of dyspnea: Secondary | ICD-10-CM

## 2023-04-13 DIAGNOSIS — I69354 Hemiplegia and hemiparesis following cerebral infarction affecting left non-dominant side: Secondary | ICD-10-CM | POA: Diagnosis not present

## 2023-04-13 DIAGNOSIS — L89023 Pressure ulcer of left elbow, stage 3: Secondary | ICD-10-CM | POA: Diagnosis not present

## 2023-04-13 DIAGNOSIS — I2510 Atherosclerotic heart disease of native coronary artery without angina pectoris: Secondary | ICD-10-CM | POA: Diagnosis not present

## 2023-04-13 DIAGNOSIS — I25118 Atherosclerotic heart disease of native coronary artery with other forms of angina pectoris: Secondary | ICD-10-CM | POA: Diagnosis not present

## 2023-04-13 DIAGNOSIS — I5189 Other ill-defined heart diseases: Secondary | ICD-10-CM

## 2023-04-13 DIAGNOSIS — I119 Hypertensive heart disease without heart failure: Secondary | ICD-10-CM | POA: Diagnosis not present

## 2023-04-13 DIAGNOSIS — E785 Hyperlipidemia, unspecified: Secondary | ICD-10-CM | POA: Diagnosis not present

## 2023-04-13 DIAGNOSIS — D6859 Other primary thrombophilia: Secondary | ICD-10-CM | POA: Diagnosis not present

## 2023-04-13 DIAGNOSIS — Z7901 Long term (current) use of anticoagulants: Secondary | ICD-10-CM | POA: Diagnosis not present

## 2023-04-13 MED ORDER — FUROSEMIDE 20 MG PO TABS
20.0000 mg | ORAL_TABLET | Freq: Every day | ORAL | 3 refills | Status: DC
Start: 2023-04-13 — End: 2023-11-13

## 2023-04-13 NOTE — Progress Notes (Signed)
Cardiology Office Note:  .   Date:  04/13/2023  ID:  Brian [name], DOB 04-24-1933, MRN [id number] PCP: Mliss Sax, MD  [facility] Providers Cardiologist:  Chilton Si, MD    History of Present Illness: .   Brian [name] is a 89 y.o. male with hx of persistent atrial fibrillation, GI bleed, prior CVA, HTN, prior MV repair at [facility] 2002 .  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 Warfarin switched to Eliquis.   Last seen by Dr. Duke Salvia 06/2021. Echo 02/2022 normal LVEF and RV function, moderately elevated PASP, indeterminate diastolic function, bilateral atria severely dilated, MR s/p repair with mild MR and mean gradient 3.6 mmHg, moderate AI without AS.  ED visit 04/11/23 after presenting with dyspnea and left chest pain. Difficult history as Brian [name] is nonambulatory. CT with no PE but did show small L and trace R pleural effusion. Given 20mg  IV Lasix and discharged. HS troponin 19 ? 17. BNP 653.4   Presents today for follow up with son and aide. Notes L sided chest discomfort in L axillary which is tender on palpation. Discussed unlikely to be anginal. Reports dyspnea at rest. Not active to assess for exertional dyspnea. No significant orthopnea, PND. Notes swelling in his abdomen but not LE.   ROS: Please see the history of present illness.    All other systems reviewed and are negative.   Studies Reviewed: .        Cardiac Studies & Procedures       ECHOCARDIOGRAM  ECHOCARDIOGRAM COMPLETE 03/15/2022  Narrative ECHOCARDIOGRAM REPORT    Patient Name:   Brian [name] Date of Exam: 03/15/2022 Medical Rec #:  [id number]      Height:       68.0 in Accession #:    [id number]     Weight:       185.0 lb Date of Birth:  1933/06/07      BSA:          1.977 m Patient Age:    89 years       BP:           125/64 mmHg Patient Gender: M              HR:           98 bpm. Exam  Location:  Inpatient  Procedure: 2D Echo, Cardiac Doppler and Color Doppler  MODIFIED REPORT: This report was modified by Weston Brass MD on 03/15/2022 due to revision. Indications:     Syncope  History:         Patient has no prior history of Echocardiogram examinations. CAD, Arrythmias:Atrial Fibrillation; Risk Factors:Hypertension.  Mitral Valve: unknown size prosthetic annuloplasty ring valve is present in the mitral position. Procedure Date: 2002 [facility].  Sonographer:     Cleatis Polka Referring Phys:  [id number] Edsel Petrin M PATEL Diagnosing Phys: Weston Brass MD  IMPRESSIONS   1. Left ventricular ejection fraction, by estimation, is 55 to 60%. The left ventricle has normal function. The left ventricle has no regional wall motion abnormalities. There is mild left ventricular hypertrophy. Left ventricular diastolic parameters are indeterminate. 2. Right ventricular systolic function is normal. The right ventricular size is mildly enlarged. There is moderately elevated pulmonary artery systolic pressure. The estimated right ventricular systolic pressure is 48.4 mmHg. 3. Left atrial size was severely dilated. 4. Right atrial size was severely dilated. 5. The mitral valve  has been repaired/replaced. Mild mitral valve regurgitation. The mean mitral valve gradient is 3.6 mmHg with average heart rate of 70 bpm. There is a unknown size prosthetic annuloplasty ring present in the mitral position. Procedure Date: 2002 [facility]. Echo findings are consistent with normal structure and function of the mitral valve prosthesis. 6. Tricuspid valve regurgitation is severe. 7. The aortic valve is abnormal. There is mild calcification of the aortic valve. Aortic valve regurgitation is moderate. No aortic stenosis is present. 8. The inferior vena cava is dilated in size with <50% respiratory variability, suggesting right atrial pressure of 15 mmHg.  FINDINGS Left Ventricle: Left ventricular ejection  fraction, by estimation, is 55 to 60%. The left ventricle has normal function. The left ventricle has no regional wall motion abnormalities. The left ventricular internal cavity size was normal in size. There is mild left ventricular hypertrophy. Abnormal (paradoxical) septal motion consistent with post-operative status. Left ventricular diastolic parameters are indeterminate.  Right Ventricle: The right ventricular size is mildly enlarged. No increase in right ventricular wall thickness. Right ventricular systolic function is normal. There is moderately elevated pulmonary artery systolic pressure. The tricuspid regurgitant velocity is 2.89 m/s, and with an assumed right atrial pressure of 15 mmHg, the estimated right ventricular systolic pressure is 48.4 mmHg.  Left Atrium: Left atrial size was severely dilated.  Right Atrium: Right atrial size was severely dilated.  Pericardium: There is no evidence of pericardial effusion.  Mitral Valve: The mitral valve has been repaired/replaced. Mild mitral valve regurgitation. There is a unknown size prosthetic annuloplasty ring present in the mitral position. Procedure Date: 2002 [facility]. Echo findings are consistent with normal structure and function of the mitral valve prosthesis. The mean mitral valve gradient is 3.6 mmHg with average heart rate of 70 bpm.  Tricuspid Valve: The tricuspid valve is normal in structure. Tricuspid valve regurgitation is severe. No evidence of tricuspid stenosis.  Aortic Valve: The aortic valve is abnormal. There is mild calcification of the aortic valve. Aortic valve regurgitation is moderate. Aortic regurgitation PHT measures 558 msec. No aortic stenosis is present. Aortic valve peak gradient measures 9.5 mmHg.  Pulmonic Valve: The pulmonic valve was normal in structure. Pulmonic valve regurgitation is mild. No evidence of pulmonic stenosis.  Aorta: The aortic root is normal in size and structure. Ascending aorta  measurements are within normal limits for age when indexed to body surface area.  Venous: The inferior vena cava is dilated in size with less than 50% respiratory variability, suggesting right atrial pressure of 15 mmHg.  IAS/Shunts: No atrial level shunt detected by color flow Doppler.   LEFT VENTRICLE PLAX 2D LVIDd:         3.90 cm      Diastology LVIDs:         3.00 cm      LV e' medial:    6.31 cm/s LV PW:         1.10 cm      LV E/e' medial:  25.0 LV IVS:        1.10 cm      LV e' lateral:   7.51 cm/s LVOT diam:     2.20 cm      LV E/e' lateral: 21.0 LV SV:         72 LV SV Index:   36 LVOT Area:     3.80 cm  LV Volumes (MOD) LV vol d, MOD A2C: 107.0 ml LV vol d, MOD A4C: 120.0 ml LV vol  s, MOD A2C: 45.4 ml LV vol s, MOD A4C: 49.9 ml LV SV MOD A2C:     61.6 ml LV SV MOD A4C:     120.0 ml LV SV MOD BP:      68.1 ml  RIGHT VENTRICLE            IVC RV Basal diam:  4.30 cm    IVC diam: 2.20 cm RV S prime:     9.25 cm/s TAPSE (M-mode): 1.4 cm  LEFT ATRIUM             Index        RIGHT ATRIUM           Index LA diam:        4.50 cm 2.28 cm/m   RA Area:     18.70 cm LA Vol (A2C):   89.3 ml 45.17 ml/m  RA Volume:   48.60 ml  24.58 ml/m LA Vol (A4C):   72.5 ml 36.67 ml/m LA Biplane Vol: 85.0 ml 42.99 ml/m AORTIC VALVE AV Area (Vmax): 2.57 cm AV Vmax:        154.00 cm/s AV Peak Grad:   9.5 mmHg LVOT Vmax:      104.00 cm/s LVOT Vmean:     72.400 cm/s LVOT VTI:       0.189 m AI PHT:         558 msec  AORTA Ao Root diam: 3.70 cm Ao Asc diam:  3.40 cm  MITRAL VALVE                TRICUSPID VALVE MV Area (PHT): 5.09 cm     TR Peak grad:   33.4 mmHg MV Mean grad:  3.6 mmHg     TR Vmax:        289.00 cm/s MV Decel Time: 149 msec MV E velocity: 158.00 cm/s  SHUNTS Systemic VTI:  0.19 m Systemic Diam: 2.20 cm  Weston Brass MD Electronically signed by Weston Brass MD Signature Date/Time: 03/15/2022/4:12:04 PM    Final (Updated)             Risk  Assessment/Calculations:    CHA2DS2-VASc Score = 7   This indicates a 11.2% annual risk of stroke. The patient's score is based upon: CHF History: 1 HTN History: 1 Diabetes History: 0 Stroke History: 2 Vascular Disease History: 1 Age Score: 2 Gender Score: 0            Physical Exam:   VS:  BP 116/71   Pulse 92   Ht 5\' 5"  (1.651 m)   BMI 31.62 kg/m    Wt Readings from Last 3 Encounters:  04/11/23 190 lb (86.2 kg)  03/27/23 190 lb (86.2 kg)  09/12/22 185 lb (83.9 kg)    GEN: Well nourished, overweight, well developed in no acute distress NECK: No JVD; No carotid bruits CARDIAC: IRIR, no murmurs, rubs, gallops RESPIRATORY:  Clear to auscultation without rales, wheezing or rhonchi  ABDOMEN: Soft, non-tender, non-distended EXTREMITIES:  No edema; No deformity   ASSESSMENT AND PLAN: .    Dypsnea / Elevated BNP / Diastolic dysfunction - Recent ED visit small R pleural effusion, BNP 600 with some improvement in symptoms after 20mg  IV Laisx with recurrent dyspnea since that time. Likely element of diastolic dysfunction given persistent atrial fibrillation.  Rx Lasix 20mg  every day. Will send Rx for Furoscix (Lasix SQ) to keep on hand in case of persistent volume overload and prevent ED visits. Prompt follow up  in 2 weeks with BMP at that time. If symptoms do not improve with diuresis, consider echocardiogram at follow up. However, at this time not sure it would significantly change plan of care.   HTN - BP well controlled. Continue current antihypertensive regimen.    CAD / HLD - Left sided chest discomfort under axilla atypical for angina with no indication for ischemic evaluation. CT 03/2023 minimal coronary artery and aortic artery atherosclerosis. GDMT Metoprolol, Pravastatin. No ASA due to [facility].  S/p MV repair - Echo 02/2022 mild MR with  mean gradient 3.6 mmHg.   Persistent atrial fibrillation / Hypercoagulable state - Rate controlled today. Continue Eliquis, Metoprolol.  Unclear why on reduced dose Eliquis as weight >70kg and creatinine <1.5. If creatinine persistently <1.5 consider adjusting dose at follow up vs transition to Xarelto.        Dispo: follow up in 2 weeks with labs at that time  Signed, Alver Sorrow, NP

## 2023-04-13 NOTE — Patient Instructions (Addendum)
Medication Instructions:  Your physician has recommended you make the following change in your medication:  START Furosemide 20mg  one tablet daily  *If you need a refill on your cardiac medications before your next appointment, please call your pharmacy*  Follow-Up: At [facility], you and your health needs are our priority.  As part of our continuing mission to provide you with exceptional heart care, we have created designated Provider Care Teams.  These Care Teams include your primary Cardiologist (physician) and Advanced Practice Providers (APPs -  Physician Assistants and Nurse Practitioners) who all work together to provide you with the care you need, when you need it.  We recommend signing up for the patient portal called "MyChart".  Sign up information is provided on this After Visit Summary.  MyChart is used to connect with patients for Virtual Visits (Telemedicine).  Patients are able to view lab/test results, encounter notes, upcoming appointments, etc.  Non-urgent messages can be sent to your provider as well.   To learn more about what you can do with MyChart, go to [email or web address].    Your next appointment:   9/3 at 1:55pm with Gillian Shields, NP  Other Instructions

## 2023-04-16 ENCOUNTER — Encounter (HOSPITAL_BASED_OUTPATIENT_CLINIC_OR_DEPARTMENT_OTHER): Payer: Self-pay

## 2023-04-16 DIAGNOSIS — I119 Hypertensive heart disease without heart failure: Secondary | ICD-10-CM | POA: Diagnosis not present

## 2023-04-16 DIAGNOSIS — Z7901 Long term (current) use of anticoagulants: Secondary | ICD-10-CM | POA: Diagnosis not present

## 2023-04-16 DIAGNOSIS — I69354 Hemiplegia and hemiparesis following cerebral infarction affecting left non-dominant side: Secondary | ICD-10-CM | POA: Diagnosis not present

## 2023-04-16 DIAGNOSIS — I4819 Other persistent atrial fibrillation: Secondary | ICD-10-CM | POA: Diagnosis not present

## 2023-04-16 DIAGNOSIS — I2510 Atherosclerotic heart disease of native coronary artery without angina pectoris: Secondary | ICD-10-CM | POA: Diagnosis not present

## 2023-04-16 DIAGNOSIS — L89023 Pressure ulcer of left elbow, stage 3: Secondary | ICD-10-CM | POA: Diagnosis not present

## 2023-04-17 ENCOUNTER — Encounter (HOSPITAL_BASED_OUTPATIENT_CLINIC_OR_DEPARTMENT_OTHER): Payer: Medicare Other | Attending: General Surgery | Admitting: General Surgery

## 2023-04-17 DIAGNOSIS — L89023 Pressure ulcer of left elbow, stage 3: Secondary | ICD-10-CM | POA: Diagnosis not present

## 2023-04-17 DIAGNOSIS — I2510 Atherosclerotic heart disease of native coronary artery without angina pectoris: Secondary | ICD-10-CM | POA: Diagnosis not present

## 2023-04-17 DIAGNOSIS — I4819 Other persistent atrial fibrillation: Secondary | ICD-10-CM | POA: Insufficient documentation

## 2023-04-17 DIAGNOSIS — Z8673 Personal history of transient ischemic attack (TIA), and cerebral infarction without residual deficits: Secondary | ICD-10-CM | POA: Insufficient documentation

## 2023-04-17 NOTE — Progress Notes (Signed)
HENSEL, CRILLEY ([id number]) [id number] Page 1 of 7 Visit Report for 04/17/2023 Arrival Information Details Patient Name: Date of Service: Brian Brian [name] ID Brian [name] 04/17/2023 2:15 PM Medical Record Number: [id number] Patient Account Number: [id number] Date of Birth/Sex: Treating RN: 1933/08/08 (89 y.o. Brian Brian [name] Primary Care Brian Brian [name]: Brian Brian [name] Other Clinician: Referring Brian Brian [name]: Treating Brian Brian [name]/Extender: Brian Brian [name] in Treatment: 60 Visit Information History Since Last Visit Added or deleted any medications: No Patient Arrived: Wheel Chair Any new allergies or adverse reactions: No Arrival Time: 14:11 Had a fall or experienced change in No Accompanied By: son activities of daily living that may affect Transfer Assistance: None risk of falls: Patient Identification Verified: Yes Signs or symptoms of abuse/neglect since last visito No Secondary Verification Process Completed: Yes Hospitalized since last visit: No Patient Requires Transmission-Based Precautions: No Implantable device outside of the clinic excluding No Patient Has Alerts: Yes cellular tissue based products placed in the center Patient Alerts: Patient on Blood Thinner since last visit: Has Dressing in Place as Prescribed: Yes Pain Present Now: Yes Electronic Signature(s) Signed: 04/17/2023 2:51:26 PM By: Brian Brian [name] Entered By: Brian Brian [name] on 04/17/2023 14:12:19 -------------------------------------------------------------------------------- Encounter Discharge Information Details Patient Name: Date of Service: Brian Brian [name] ID Brian [name] 04/17/2023 2:15 PM Medical Record Number: [id number] Patient Account Number: [id number] Date of Birth/Sex: Treating RN: 11-09-1932 (89 y.o. Brian Brian [name] Primary Care Yadriel Kerrigan: Brian Brian [name] Other Clinician: Referring Brian Brian [name]: Treating Brian Brian [name]/Extender: Brian Brian [name]  in Treatment: 52 Encounter Discharge Information Items Discharge Condition: Stable Ambulatory Status: Wheelchair Discharge Destination: Home Transportation: Private Auto Accompanied By: son Schedule Follow-up Appointment: Yes Clinical Summary of Care: Patient Declined Electronic Signature(s) Signed: 04/17/2023 2:51:26 PM By: Brian Brian [name] Entered By: Brian Brian [name] on 04/17/2023 14:49:15 Brian Brian [name], Brian Brian [name] ([id number]) [id number] Page 2 of 7 -------------------------------------------------------------------------------- Lower Extremity Assessment Details Patient Name: Date of Service: Brian Brian [name] ID Brian [name] 04/17/2023 2:15 PM Medical Record Number: [id number] Patient Account Number: [id number] Date of Birth/Sex: Treating RN: Nov 02, 1932 (89 y.o. Brian Brian [name] Primary Care Brian Brian [name]: Brian Brian [name] Other Clinician: Referring Temica Righetti: Treating Brian Brian [name]/Extender: Brian Brian [name] in Treatment: 43 Electronic Signature(s) Signed: 04/17/2023 2:51:26 PM By: Brian Brian [name] Entered By: Brian Brian [name] on 04/17/2023 14:13:46 -------------------------------------------------------------------------------- Multi Wound Chart Details Patient Name: Date of Service: Brian Brian [name] ID Brian [name] 04/17/2023 2:15 PM Medical Record Number: [id number] Patient Account Number: [id number] Date of Birth/Sex: Treating RN: 06-25-1933 (89 y.o. Brian [name]) Primary Care Brian Brian [name]: Brian Brian [name] Other Clinician: Referring Brian Brian [name]: Treating Brian Brian [name]/Extender: Brian Brian [name] in Treatment: 61 Vital Signs Height(in): 66 Pulse(bpm): 76 Weight(lbs): 185 Blood Pressure(mmHg): 113/66 Body Mass Index(BMI): 29.9 Temperature(F): 98.7 Respiratory Rate(breaths/min): 18 [1:Photos:] [N/A:N/A] Left Elbow N/A N/A Wound Location: Pressure Injury N/A N/A Wounding Event: Pressure Ulcer N/A N/A Primary Etiology: Cataracts, Arrhythmia, Coronary  N/A N/A Comorbid History: Artery Disease, Hypotension, Osteoarthritis 12/19/2021 N/A N/A Date Acquired: 108 N/A N/A Weeks of Treatment: Open N/A N/A Wound Status: No N/A N/A Wound Recurrence: Yes N/A N/A Clustered Wound: 1.1x1.4x0.4 N/A N/A Measurements L x W x D (cm) 1.21 N/A N/A A (cm) : rea 0.484 N/A N/A Volume (cm) : 87.40% N/A N/A % Reduction in A rea: 49.70% N/A N/A % Reduction in Volume: Category/Stage III N/A N/A Classification: Medium N/A N/A Exudate A mount: Serosanguineous N/A N/A Exudate Type: red, brown N/A N/A Exudate Color: Distinct, outline attached N/A N/A Wound Margin: Large (67-100%) N/A N/A Granulation A mount: Brian Brian [name], Brian  Brian [name] ([id number]) [id number] Page 3 of 7 Red, Hyper-granulation N/A N/A Granulation Quality: Small (1-33%) N/A N/A Necrotic Amount: Fat Layer (Subcutaneous Tissue): Yes N/A N/A Exposed Structures: Fascia: No Tendon: No Muscle: No Joint: No Bone: No Medium (34-66%) N/A N/A Epithelialization: No Abnormalities Noted N/A N/A Periwound Skin Texture: No Abnormalities Noted N/A N/A Periwound Skin Moisture: No Abnormalities Noted N/A N/A Periwound Skin Color: No Abnormality N/A N/A Temperature: Yes N/A N/A Tenderness on Palpation: Chemical Cauterization N/A N/A Procedures Performed: Treatment Notes Electronic Signature(s) Signed: 04/17/2023 2:49:00 PM By: Duanne Guess MD FACS Entered By: Duanne Guess on 04/17/2023 14:49:00 -------------------------------------------------------------------------------- Multi-Disciplinary Care Plan Details Patient Name: Date of Service: Brian Brian [name] ID Brian [name] 04/17/2023 2:15 PM Medical Record Number: [id number] Patient Account Number: [id number] Date of Birth/Sex: Treating RN: 08-05-1933 (89 y.o. Brian Brian [name] Primary Care Tilia Faso: Brian Brian [name] Other Clinician: Referring Brian Brian [name]: Treating Brian Brian [name]/Extender: Brian Brian [name] in Treatment: 27 Multidisciplinary Care Plan reviewed with physician Active Inactive Abuse / Safety / Falls / Self Care Management Nursing Diagnoses: Impaired physical mobility Potential for falls Goals: Patient/caregiver will verbalize understanding of skin care regimen Date Initiated: 02/14/2022 Date Inactivated: 04/17/2023 Target Resolution Date: 04/21/2023 Goal Status: Met Patient/caregiver will verbalize/demonstrate measures taken to prevent injury and/or falls Date Initiated: 02/14/2022 Target Resolution Date: 05/18/2023 Goal Status: Active Interventions: Assess fall risk on admission and as needed Assess self care needs on admission and as needed Notes: Wound/Skin Impairment Nursing Diagnoses: Impaired tissue integrity Knowledge deficit related to ulceration/compromised skin integrity Goals: Patient/caregiver will verbalize understanding of skin care regimen Date Initiated: 02/14/2022 Target Resolution Date: 05/18/2023 Goal Status: Active Ulcer/skin breakdown will have a volume reduction of 30% by week 4 Date Initiated: 02/14/2022 Date Inactivated: 05/23/2022 Target Resolution Date: 05/05/2022 Brian Brian [name], Brian Brian [name] ([id number]) [phone].pdf Page 4 of 7 Goal Status: Unmet Unmet Reason: new PU left glut Interventions: Assess patient/caregiver ability to obtain necessary supplies Assess patient/caregiver ability to perform ulcer/skin care regimen upon admission and as needed Assess ulceration(s) every visit Treatment Activities: Skin care regimen initiated : 02/14/2022 Topical wound management initiated : 02/14/2022 Notes: Electronic Signature(s) Signed: 04/17/2023 2:51:26 PM By: Brian Brian [name] Entered By: Brian Brian [name] on 04/17/2023 14:47:30 -------------------------------------------------------------------------------- Pain Assessment Details Patient Name: Date of Service: Brian Brian [name] ID Brian [name] 04/17/2023 2:15 PM Medical Record  Number: [id number] Patient Account Number: [id number] Date of Birth/Sex: Treating RN: Feb 08, 1933 (89 y.o. Brian Brian [name] Primary Care Aquita Simmering: Brian Brian [name] Other Clinician: Referring Jayron Maqueda: Treating Zelpha Messing/Extender: Brian Brian [name] in Treatment: 61 Active Problems Location of Pain Severity and Description of Pain Patient Has Paino Yes Site Locations Pain Location: Pain in Ulcers Duration of the Pain. Constant / Intermittento Constant Rate the pain. Current Pain Level: 5 Character of Pain Describe the Pain: Burning Pain Management and Medication Current Pain Management: Medication: Yes Electronic Signature(s) Signed: 04/17/2023 2:51:26 PM By: Brian Brian [name] Entered By: Brian Brian [name] on 04/17/2023 14:13:41 Brian Brian [name], Brian Brian [name] ([id number]) [id number] Page 5 of 7 -------------------------------------------------------------------------------- Patient/Caregiver Education Details Patient Name: Date of Service: Brian Brian [name] ID Brian [name] 8/27/2024andnbsp2:15 PM Medical Record Number: [id number] Patient Account Number: [id number] Date of Birth/Gender: Treating RN: [birth year] (89 y.o. Brian Brian [name] Primary Care Physician: Brian Brian [name] Other Clinician: Referring Physician: Treating Physician/Extender: Brian Brian [name] in Treatment: 2 Education Assessment Education Provided To: Patient Education Topics Provided Wound/Skin Impairment: Methods: Explain/Verbal Responses: Reinforcements needed, State content correctly Electronic Signature(s) Signed: 04/17/2023 2:51:26 PM By: Brian Brian [name] Entered By: Ander Slade  Ladona Ridgel on 04/17/2023 14:48:33 -------------------------------------------------------------------------------- Wound Assessment Details Patient Name: Date of Service: Brian Brian [name] ID Brian [name] 04/17/2023 2:15 PM Medical Record Number: [id number] Patient Account Number:  [id number] Date of Birth/Sex: Treating RN: 1933/04/28 (89 y.o. Brian Brian [name] Primary Care Reanna Scoggin: Brian Brian [name] Other Clinician: Referring Mavryk Pino: Treating Imanii Gosdin/Extender: Brian Brian [name] in Treatment: 61 Wound Status Wound Number: 1 Primary Pressure Ulcer Etiology: Wound Location: Left Elbow Wound Status: Open Wounding Event: Pressure Injury Comorbid Cataracts, Arrhythmia, Coronary Artery Disease, Date Acquired: 12/19/2021 History: Hypotension, Osteoarthritis Weeks Of Treatment: 61 Clustered Wound: Yes Photos Wound Measurements Length: (cm) 1.1 Width: (cm) 1.4 Brian Brian [name], Brian Brian [name] ([id number]) Depth: (cm) 0.4 Area: (cm) 1.21 Volume: (cm) 0.484 % Reduction in Area: 87.4% % Reduction in Volume: 49.7% [id number] Page 6 of 7 Epithelialization: Medium (34-66%) Tunneling: No Undermining: No Wound Description Classification: Category/Stage III Wound Margin: Distinct, outline attached Exudate Amount: Medium Exudate Type: Serosanguineous Exudate Color: red, brown Foul Odor After Cleansing: No Slough/Fibrino Yes Wound Bed Granulation Amount: Large (67-100%) Exposed Structure Granulation Quality: Red, Hyper-granulation Fascia Exposed: No Necrotic Amount: Small (1-33%) Fat Layer (Subcutaneous Tissue) Exposed: Yes Necrotic Quality: Adherent Slough Tendon Exposed: No Muscle Exposed: No Joint Exposed: No Bone Exposed: No Periwound Skin Texture Texture Color No Abnormalities Noted: Yes No Abnormalities Noted: Yes Moisture Temperature / Pain No Abnormalities Noted: Yes Temperature: No Abnormality Tenderness on Palpation: Yes Treatment Notes Wound #1 (Elbow) Wound Laterality: Left Cleanser Soap and Water Discharge Instruction: May shower and wash wound with dial antibacterial soap and water prior to dressing change. Wound Cleanser Discharge Instruction: Cleanse the wound with wound cleanser prior to applying a  clean dressing using gauze sponges, not tissue or cotton balls. Peri-Wound Care Topical Gentamicin Discharge Instruction: As directed by physician Mupirocin Ointment Discharge Instruction: Apply Mupirocin (Bactroban) as instructed Primary Dressing Hydrofera Blue Ready Transfer Foam, 2.5x2.5 (in/in) Discharge Instruction: Apply directly to wound bed as directed Secondary Dressing ALLEVYN Gentle Border, 5x5 (in/in) Discharge Instruction: Apply over primary dressing as directed. Secured With Compression Wrap Compression Stockings Add-Ons Engineer, maintenance, 6 (in) Electronic Signature(s) Signed: 04/17/2023 2:51:26 PM By: Brian Brian [name] Entered By: Brian Brian [name] on 04/17/2023 14:31:44 Brian Brian [name], Brian Brian [name] ([id number]) [id number] Page 7 of 7 -------------------------------------------------------------------------------- Vitals Details Patient Name: Date of Service: Brian Brian [name] ID Brian [name] 04/17/2023 2:15 PM Medical Record Number: [id number] Patient Account Number: [id number] Date of Birth/Sex: Treating RN: 1933/05/16 (89 y.o. Brian Brian [name] Primary Care Telena Peyser: Brian Brian [name] Other Clinician: Referring Odie Edmonds: Treating Inetha Maret/Extender: Brian Brian [name] in Treatment: 61 Vital Signs Time Taken: 14:13 Temperature (F): 98.7 Height (in): 66 Pulse (bpm): 76 Weight (lbs): 185 Respiratory Rate (breaths/min): 18 Body Mass Index (BMI): 29.9 Blood Pressure (mmHg): 113/66 Reference Range: 80 - 120 mg / dl Electronic Signature(s) Signed: 04/17/2023 2:51:26 PM By: Brian Brian [name] Entered By: Brian Brian [name] on 04/17/2023 14:13:28

## 2023-04-17 NOTE — Progress Notes (Signed)
CALAN, OTANEZ ([id number]) [id number] Page 1 of 10 Visit Report for 04/17/2023 Chief Complaint Document Details Patient Name: Date of Service: Brian [name] 04/17/2023 2:15 PM Medical Record Number: [id number] Patient Account Number: [id number] Date of Birth/Sex: Treating RN: Brian [name]-[birth year] (89 y.o. [name]) Primary Care Provider: Nadene Rubins Other Clinician: Referring Provider: Treating Provider/Extender: [name] in Treatment: 08 Information Obtained from: Patient Chief Complaint 08/05/2019; patient is here for review of pressure ulcers on his buttock 02/14/22: patient is here for review of pressure ulcers on his left elbow Electronic Signature(s) Signed: 04/17/2023 2:49:16 PM By: Duanne Guess MD FACS Entered By: Duanne Guess on 04/17/2023 14:49:15 -------------------------------------------------------------------------------- HPI Details Patient Name: Date of Service: Brian [name] 04/17/2023 2:15 PM Medical Record Number: [id number] Patient Account Number: [id number] Date of Birth/Sex: Treating RN: Brian [name], Brian [name] (89 y.o. [name]) Primary Care Provider: Nadene Rubins Other Clinician: Referring Provider: Treating Provider/Extender: [name] in Treatment: 65 History of Present Illness HPI Description: ADMISSION 08/05/19 This is an 89 year old man who arrives in clinic accompanied by his son. 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 Apparently noted recently to have a stage I pressure area on the left buttock. He does not have a wound history. He does have been air fluidized mattress. They have a wheelchair cushion as well as a pillow over the top of this. He is not incontinent of urine or bowel. Past medical history includes hypertension, osteoarthritis of the knee, atrial fibrillation, history of mitral valve repair, right hemisphere CVA with  left hemiparesis, history of a DVT READMISSION 02/14/2022 The patient is here today for evaluation of pressure ulcers on his left elbow and possible right buttock ulcer. Apparently when he was seen by Dr. Leanord Hawking in 2020, no ulcer was appreciated on the buttock and no further wound care involvement occurred. 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 He does have home health aides and they are turning him every 2 hours side to side. He is on a memory foam mattress at this time. 7/[name]/2023: The patient was scheduled to see me on July 11, but apparently his son panicked about some bright red blood per rectum and rushed him to the emergency room. There is also a comment in the electronic medical record that the patient's son thought the bone was exposed on his elbow. After waiting a prolonged period of time at the emergency department, they left without being seen. Today, it appears that the dressing on the patient's elbow was never changed since our last visit. The 2 wounds have converged creating a larger wound. According to the intake nurse, there was a foul odor from the wound and dressing. There is extensive slough on the wound surface. The bleeding per rectum turns out to be hemorrhoids. The patient's son expresses that he does not know how to manage this. 04/07/2022: For unclear reasons, the patient has not returned to clinic until they were contacted today and they made an add-on visit. The patient's son was not originally present at the beginning of the visit but showed up about [name] minutes into the visit. The patient was accompanied by a home aide. She reported that when she cleaned the patient up today, there was just a Band-Aid on his wound. She said the surface was  fairly mucky but she was able to wash this off. It is abundantly clear that this wound is not being properly  cared for. Apparently he did have [facility] home health but they have not been out in some time and it is not clear the reason for this. The surface of the wound is a bit cleaner so perhaps the Santyl has been applied, but unfortunately there is now a deep tunnel Brian [name], Brian [name] ([id number]) [id number] Page 2 of 10 that extends up from the elbow to the patient's posterior upper arm for a number centimeters. There is no evidence of infection or purulent drainage. It is also clear that the patient continues to spend a substantial portion of his time leaning on the elbow. 05/23/2022: Once again, the patient has failed to return to clinic for about 6 weeks and the reason remains unclear. He is apparently receiving home health assistance. The wound on his elbow is covered with hypertrophic granulation tissue. He also has a blister just distal to the wound on his posterior forearm. He has a new stage III pressure ulcer on his left gluteus with slough accumulation. He apparently spends a substantial portion of his days sitting in a wheelchair. I do not believe he has a Museum/gallery conservator. Due to his stroke, he lists to the left side, which is how his ulcer on his elbow developed. I suspect the gluteal ulcer is as a result of this, as well. 06/13/2022: The wound on his left gluteus is nearly healed. It is very superficial and quite clean. His son reports that he has just been applying Desitin to the area. The elbow wound is smaller but still has some hypertrophic granulation tissue and slough accumulation. No concern for infection at either site. 06/27/2022: The buttock wounds are healed. There is some tissue maceration without any significant skin breakdown. The elbow wound continues to contract. There is still some hypertrophic granulation tissue and slough buildup. 07/25/2022: The buttock wounds have reopened. The periwound skin has been kept very dry through liberal use of zinc oxide,  however. The elbow wound dressing did not look like it had been changed in at least a week when it was removed. The underlying wound, however has not reaccumulated any hypertrophic granulation tissue; there is a thin layer of slough present. No malodor or purulent drainage from the wound itself. 08/15/2022: There is a tiny opening remaining on the buttocks. It is clean and there is no evidence of tissue maceration. The elbow looks very good. There is good beefy tissue present and it is flush with the surrounding skin. Home health has been coming out. 09/05/2022: The wound on his bottom is healed. The elbow shows signs of pressure induced deep tissue injury and also has some hypertrophic granulation tissue. 09/26/2022: The elbow looks much better today. There is a little bit of slough on the surface but there is no undermining or tunneling. He does have some hypertrophic granulation tissue accumulation. 10/17/2022: His elbow wound continues to improve. It is smaller, cleaner, and more superficial. There is hypertrophic granulation tissue present. 11/14/2022: His elbow ulcer is smaller again today. There is some slough on the surface. 12/19/2022: The pressure ulcer on his elbow is smaller again today. There is an area that looks like he may have bumped it on something, as it is a little bit bruised and purpleish. He is also requesting an order for physical therapy. 01/16/2023: The pressure ulcer on his elbow looks better than I have ever seen it. It  is smaller and more superficial with very little slough on the surface. 6/[name]/2024: The wound is just the tiniest bit smaller today. There is a little slough on the surface. The tissue around the wound does look a bit purpleish, as though he has been allowed to lean on it, unfortunately. 03/20/2023: The wound measured smaller today. There is less evidence of pressure on the site compared to last month. There is some slough on the surface and the underlying  granulation tissue is a bit hypertrophic. 04/17/2023: The wound was smaller again today. There is some tunneling at the more proximal aspect of the wound. There is hypertrophic granulation tissue present. He has been complaining of more pain and the periwound looks a little bit red. Electronic Signature(s) Signed: 04/17/2023 2:50:01 PM By: Duanne Guess MD FACS Entered By: Duanne Guess on 04/17/2023 14:50:01 -------------------------------------------------------------------------------- Chemical Cauterization Details Patient Name: Date of Service: Brian [name] 04/17/2023 2:15 PM Medical Record Number: [id number] Patient Account Number: [id number] Date of Birth/Sex: Treating RN: Jun [birth year], Brian [name] (89 y.o. [name] Primary Care Provider: Nadene Rubins Other Clinician: Referring Provider: Treating Provider/Extender: [name] in Treatment: 87 Procedure Performed for: Wound #1 Left Elbow Performed By: Physician Duanne Guess, MD Post Procedure Diagnosis Same as Pre-procedure Electronic Signature(s) Signed: 04/17/2023 2:51:26 PM By: Samuella Bruin Signed: 04/17/2023 4:13:[name] PM By: Duanne Guess MD FACS Entered By: Samuella Bruin on 04/17/2023 14:35:56 Dinunzio, Brian [name] ([id number]) [id number] Page 3 of 10 -------------------------------------------------------------------------------- Physical Exam Details Patient Name: Date of Service: Brian [name] 04/17/2023 2:15 PM Medical Record Number: [id number] Patient Account Number: [id number] Date of Birth/Sex: Treating RN: September [birth year], Brian [name] (89 y.o. [name]) Primary Care Provider: Nadene Rubins Other Clinician: Referring Provider: Treating Provider/Extender: [name] in Treatment: 61 Constitutional . . . . no acute distress. Respiratory Normal work of breathing on room air. Notes 04/17/2023: The wound was smaller again today.  There is some tunneling at the more proximal aspect of the wound. There is hypertrophic granulation tissue present. He has been complaining of more pain and the periwound looks a little bit red. Electronic Signature(s) Signed: 04/17/2023 2:50:47 PM By: Duanne Guess MD FACS Entered By: Duanne Guess on 04/17/2023 14:50:47 -------------------------------------------------------------------------------- Physician Orders Details Patient Name: Date of Service: Brian [name] 04/17/2023 2:15 PM Medical Record Number: [id number] Patient Account Number: [id number] Date of Birth/Sex: Treating RN: [birth year] (89 y.o. [name] Primary Care Provider: Nadene Rubins Other Clinician: Referring Provider: Treating Provider/Extender: [name] in Treatment: 27 Verbal / Phone Orders: No Diagnosis Coding ICD-10 Coding Code Description L89.023 Pressure ulcer of left elbow, stage 3 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 I48.19 Other persistent atrial fibrillation I25.10 Atherosclerotic heart disease of native coronary artery without angina pectoris Follow-up Appointments Return appointment in 1 month. - Dr. Lady Gary - room 2 Anesthetic (In clinic) Topical Lidocaine 4% applied to wound bed Bathing/ Shower/ Hygiene May shower and wash wound with soap and water. - with dressing changes Off-Loading Turn and reposition every 2 hours - stay off of left elbow wear prevalon boot on elbow as directed Other: - limit time in wheelchair, reposition in wheelchair every 1-2 hours Home Health No change in wound care orders this week; continue Home Health for wound care. May utilize formulary equivalent dressing for wound treatment orders unless otherwise specified. - physical therapy once a week, wound care twice a week Other Home  Health Orders/Instructions: Dayshawn Mehmedovic, Nina [name] ([id number])  [id number] Page 4 of 10 Wound Treatment Wound #1 - Elbow Wound Laterality: Left Cleanser: Soap and Water Every Other Day/30 Days Discharge Instructions: May shower and wash wound with dial antibacterial soap and water prior to dressing change. Cleanser: Wound Cleanser (Generic) Every Other Day/30 Days Discharge Instructions: Cleanse the wound with wound cleanser prior to applying a clean dressing using gauze sponges, not tissue or cotton balls. Topical: Gentamicin Every Other Day/30 Days Discharge Instructions: As directed by physician Topical: Mupirocin Ointment Every Other Day/30 Days Discharge Instructions: Apply Mupirocin (Bactroban) as instructed Prim Dressing: Hydrofera Blue Ready Transfer Foam, 2.5x2.5 (in/in) Every Other Day/30 Days ary Discharge Instructions: Apply directly to wound bed as directed Secondary Dressing: ALLEVYN Gentle Border, 5x5 (in/in) (Generic) Every Other Day/30 Days Discharge Instructions: Apply over primary dressing as directed. Add-Ons: Cotton Tip Applicator, 6 (in) (Generic) Every Other Day/30 Days Patient Medications llergies: penicillin, Novocain A Notifications Medication Indication Start End 04/17/2023 lidocaine DOSE topical 4 % cream - cream topical 04/17/2023 gentamicin DOSE topical 0.1 % ointment - Apply to elbow wound with dressing changes 04/17/2023 mupirocin DOSE topical 2 % ointment - Apply to elbow wound with dressing changes Electronic Signature(s) Signed: 04/17/2023 4:13:[name] PM By: Duanne Guess MD FACS Previous Signature: 04/17/2023 2:56:52 PM Version By: Duanne Guess MD FACS Previous Signature: 04/17/2023 2:51:26 PM Version By: Samuella Bruin Entered By: Duanne Guess on 04/17/2023 14:59:14 -------------------------------------------------------------------------------- Problem List Details Patient Name: Date of Service: Brian [name] 04/17/2023 2:15 PM Medical Record Number:  [id number] Patient Account Number: [id number] Date of Birth/Sex: Treating RN: [birth year]/Brian [name] (89 y.o. [name]) Primary Care Provider: Nadene Rubins Other Clinician: Referring Provider: Treating Provider/Extender: [name] in Treatment: 31 Active Problems ICD-10 Encounter Code Description Active Date MDM Diagnosis L89.023 Pressure ulcer of left elbow, stage 3 02/14/2022 No Yes 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 02/14/2022 No Yes I48.19 Other persistent atrial fibrillation 02/14/2022 No Yes Brian [name], Brian [name] ([id number]) [id number] Page 5 of 10 I25.10 Atherosclerotic heart disease of native coronary artery without angina pectoris 02/14/2022 No Yes Inactive Problems ICD-10 Code Description Active Date Inactive Date L89.323 Pressure ulcer of left buttock, stage 3 05/23/2022 05/23/2022 Resolved Problems Electronic Signature(s) Signed: 04/17/2023 2:48:34 PM By: Duanne Guess MD FACS Entered By: Duanne Guess on 04/17/2023 14:48:34 -------------------------------------------------------------------------------- Progress Note Details Patient Name: Date of Service: Brian [name] 04/17/2023 2:15 PM Medical Record Number: [id number] Patient Account Number: [id number] Date of Birth/Sex: Treating RN: Brian [name][birth year] (89 y.o. [name]) Primary Care Provider: Nadene Rubins Other Clinician: Referring Provider: Treating Provider/Extender: [name] in Treatment: 19 Subjective Chief Complaint Information obtained from Patient 08/05/2019; patient is here for review of pressure ulcers on his buttock 02/14/22: patient is here for review of pressure ulcers on his left elbow History of Present Illness (HPI) ADMISSION 08/05/19 This is an 89 year old man who arrives in clinic accompanied by his son. Very disabled secondary to a right hemisphere CVA with left hemiparesis in  2017. Apparently noted recently to have a stage I pressure area on the left buttock. He does not have a wound history. He does have been air fluidized mattress. They have a wheelchair cushion as well as a pillow over the top of this. He is not incontinent of urine or bowel. Past medical history includes hypertension, osteoarthritis of the knee, atrial fibrillation, history of mitral valve repair, right hemisphere CVA with left  hemiparesis, history of a DVT READMISSION 02/14/2022 The patient is here today for evaluation of pressure ulcers on his left elbow and possible right buttock ulcer. Apparently when he was seen by Dr. Leanord Hawking in 2020, no ulcer was appreciated on the buttock and no further wound care involvement occurred. 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 He does have home health aides and they are turning him every 2 hours side to side. He is on a memory foam mattress at this time. 7/[name]/2023: The patient was scheduled to see me on July 11, but apparently his son panicked about some bright red blood per rectum and rushed him to the emergency room. There is also a comment in the electronic medical record that the patient's son thought the bone was exposed on his elbow. After waiting a prolonged period of time at the emergency department, they left without being seen. Today, it appears that the dressing on the patient's elbow was never changed since our last visit. The 2 wounds have converged creating a larger wound. According to the intake nurse, there was a foul odor from the wound and dressing. There is extensive slough on the wound surface. The bleeding per rectum turns out to be hemorrhoids. The patient's son expresses that he does not know how to manage this. 04/07/2022: For unclear reasons, the patient has not returned to clinic until they were contacted today  and they made an add-on visit. The patient's son was not originally present at the beginning of the visit but showed up about [name] minutes into the visit. The patient was accompanied by a home aide. She reported that when she cleaned the patient up today, there was just a Band-Aid on his wound. She said the surface was fairly mucky but she was able to wash this off. It is abundantly clear that this wound is not being properly cared for. Apparently he did have [facility] home health but they have not been out in some time and it is not clear the reason for this. The surface of the wound is a bit cleaner so perhaps the Santyl has been applied, but unfortunately there is now a deep tunnel that extends up from the elbow to the patient's posterior upper arm for a number centimeters. There is no evidence of infection or purulent drainage. It is also clear that the patient continues to spend a substantial portion of his time leaning on the elbow. 05/23/2022: Once again, the patient has failed to return to clinic for about 6 weeks and the reason remains unclear. He is apparently receiving home health assistance. The wound on his elbow is covered with hypertrophic granulation tissue. He also has a blister just distal to the wound on his posterior forearm. He has a new stage III pressure ulcer on his left gluteus with slough accumulation. He apparently spends a substantial portion of his days sitting in a wheelchair. I do not believe he has a Museum/gallery conservator. Due to his stroke, he lists to the left side, which is how his ulcer on his elbow developed. I suspect the gluteal ulcer is Brian [name], Brian [name] ([id number]) [id number] Page 6 of 10 as a result of this, as well. 06/13/2022: The wound on his left gluteus is nearly healed. It is very superficial and quite clean. His son reports that he has just been applying Desitin to the area.  The elbow wound is smaller but still has some hypertrophic  granulation tissue and slough accumulation. No concern for infection at either site. 06/27/2022: The buttock wounds are healed. There is some tissue maceration without any significant skin breakdown. The elbow wound continues to contract. There is still some hypertrophic granulation tissue and slough buildup. 07/25/2022: The buttock wounds have reopened. The periwound skin has been kept very dry through liberal use of zinc oxide, however. The elbow wound dressing did not look like it had been changed in at least a week when it was removed. The underlying wound, however has not reaccumulated any hypertrophic granulation tissue; there is a thin layer of slough present. No malodor or purulent drainage from the wound itself. 08/15/2022: There is a tiny opening remaining on the buttocks. It is clean and there is no evidence of tissue maceration. The elbow looks very good. There is good beefy tissue present and it is flush with the surrounding skin. Home health has been coming out. 09/05/2022: The wound on his bottom is healed. The elbow shows signs of pressure induced deep tissue injury and also has some hypertrophic granulation tissue. 09/26/2022: The elbow looks much better today. There is a little bit of slough on the surface but there is no undermining or tunneling. He does have some hypertrophic granulation tissue accumulation. 10/17/2022: His elbow wound continues to improve. It is smaller, cleaner, and more superficial. There is hypertrophic granulation tissue present. 11/14/2022: His elbow ulcer is smaller again today. There is some slough on the surface. 12/19/2022: The pressure ulcer on his elbow is smaller again today. There is an area that looks like he may have bumped it on something, as it is a little bit bruised and purpleish. He is also requesting an order for physical therapy. 01/16/2023: The pressure ulcer on his elbow looks better than I have ever seen it. It is smaller and more superficial with  very little slough on the surface. 6/[name]/2024: The wound is just the tiniest bit smaller today. There is a little slough on the surface. The tissue around the wound does look a bit purpleish, as though he has been allowed to lean on it, unfortunately. 03/20/2023: The wound measured smaller today. There is less evidence of pressure on the site compared to last month. There is some slough on the surface and the underlying granulation tissue is a bit hypertrophic. 04/17/2023: The wound was smaller again today. There is some tunneling at the more proximal aspect of the wound. There is hypertrophic granulation tissue present. He has been complaining of more pain and the periwound looks a little bit red. Patient History Information obtained from Patient. Family History Diabetes - Maternal Grandparents, Heart Disease - Mother, Hypertension - Mother, No family history of Cancer, Hereditary Spherocytosis, Kidney Disease, Lung Disease, Seizures, Stroke, Thyroid Problems, Tuberculosis. Social History Never smoker, Marital Status - Married, Alcohol Use - Never, Drug Use - No History, Caffeine Use - Never. Medical History Eyes Patient has history of Cataracts - removed Cardiovascular Patient has history of Arrhythmia - Atrial Flutter/Atrial Fib, Coronary Artery Disease, Hypotension Denies history of Hypertension Musculoskeletal Patient has history of Osteoarthritis Neurologic Denies history of Paraplegia Hospitalization/Surgery History - open heart surgery. - IR generic histoircal. - appendectomy. - hernia repair. - joint replacement. - mitral valve repair. - mvp repair. - total hip arthroplasty. Medical A Surgical History Notes nd Constitutional Symptoms (General Health) obseity Ear/Nose/Mouth/Throat wears hearing aids Cardiovascular Mitral Valve Repair, Genitourinary BPH Neurologic CVA 2018, left side hemiparesis  Objective Constitutional no acute distress. Vitals Time Taken: 2:13 PM,  Height: 66 in, Weight: 185 lbs, BMI: 29.9, Temperature: 98.7 F, Pulse: 76 bpm, Respiratory Rate: 18 breaths/min, Blood Pressure: Brian [name] ([id number]) [id number] Page 7 of 10 113/66 mmHg. Respiratory Normal work of breathing on room air. General Notes: 04/17/2023: The wound was smaller again today. There is some tunneling at the more proximal aspect of the wound. There is hypertrophic granulation tissue present. He has been complaining of more pain and the periwound looks a little bit red. Integumentary (Hair, Skin) Wound #1 status is Open. Original cause of wound was Pressure Injury. The date acquired was: 12/19/2021. The wound has been in treatment 61 weeks. The wound is located on the Left Elbow. The wound measures 1.1cm length x 1.4cm width x 0.4cm depth; 1.21cm^2 area and 0.484cm^3 volume. There is Fat Layer (Subcutaneous Tissue) exposed. There is no tunneling or undermining noted. There is a medium amount of serosanguineous drainage noted. The wound margin is distinct with the outline attached to the wound base. There is large (67-100%) red, hyper - granulation within the wound bed. There is a small (1-33%) amount of necrotic tissue within the wound bed including Adherent Slough. The periwound skin appearance had no abnormalities noted for texture. The periwound skin appearance had no abnormalities noted for moisture. The periwound skin appearance had no abnormalities noted for color. Periwound temperature was noted as No Abnormality. The periwound has tenderness on palpation. 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 An Chemical  Cauterization procedure was performed by Duanne Guess, MD. Post procedure Diagnosis Wound #1: Same as Pre-Procedure Plan Follow-up Appointments: Return appointment in 1 month. - Dr. Lady Gary - room 2 Anesthetic: (In clinic) Topical Lidocaine 4% applied to wound bed Bathing/ Shower/ Hygiene: May shower and wash wound with soap and water. - with dressing changes Off-Loading: Turn and reposition every 2 hours - stay off of left elbow wear prevalon boot on elbow as directed Other: - limit time in wheelchair, reposition in wheelchair every 1-2 hours Home Health: No change in wound care orders this week; continue Home Health for wound care. May utilize formulary equivalent dressing for wound treatment orders unless otherwise specified. - physical therapy once a week, wound care twice a week Other Home Health Orders/Instructions: - [facility] The following medication(s) was prescribed: lidocaine topical 4 % cream cream topical was prescribed at facility gentamicin topical 0.1 % ointment Apply to elbow wound with dressing changes starting 04/17/2023 mupirocin topical 2 % ointment Apply to elbow wound with dressing changes starting 04/17/2023 WOUND #1: - Elbow Wound Laterality: Left Cleanser: Soap and Water Every Other Day/30 Days Discharge Instructions: May shower and wash wound with dial antibacterial soap and water prior to dressing change. Cleanser: Wound Cleanser (Generic) Every Other Day/30 Days Discharge Instructions: Cleanse the wound with wound cleanser prior to applying a clean dressing using gauze sponges, not tissue or cotton balls. Topical: Gentamicin Every Other Day/30 Days Discharge Instructions: As directed by physician Topical: Mupirocin Ointment Every Other Day/30 Days Discharge Instructions: Apply Mupirocin (Bactroban) as instructed Prim Dressing: Hydrofera Blue Ready Transfer Foam, 2.5x2.5 (in/in) Every Other Day/30 Days ary Discharge Instructions: Apply directly to wound bed  as directed Secondary Dressing: ALLEVYN Gentle Border,  5x5 (in/in) (Generic) Every Other Day/30 Days Discharge Instructions: Apply over primary dressing as directed. Add-Ons: Cotton Tip Applicator, 6 (in) (Generic) Every Other Day/30 Days 04/17/2023: The wound was smaller again today. There is some tunneling at the more proximal aspect of the wound. There is hypertrophic granulation tissue present. He has been complaining of more pain and the periwound looks a little bit red. Brian, [name] ([id number]) [id number] Page 8 of 10 No debridement was necessary today. I chemically cauterized the hypertrophic granulation tissue with silver nitrate. While I do not think he requires a systemic antibiotic. I think the wound might benefit from topical agents, so we will apply a mixture of topical gentamicin and mupirocin along with the [facility] that we have been using. Per the patient and his family's preference, he will follow-up in about 1 month. Electronic Signature(s) Signed: 04/17/2023 3:24:[name] PM By: Duanne Guess MD FACS Previous Signature: 04/17/2023 2:59:57 PM Version By: Duanne Guess MD FACS Entered By: Duanne Guess on 04/17/2023 15:24:24 -------------------------------------------------------------------------------- HxROS Details Patient Name: Date of Service: Brian [name] 04/17/2023 2:15 PM Medical Record Number: [id number] Patient Account Number: [id number] Date of Birth/Sex: Treating RN: [birth year], Brian [name] (89 y.o. [name]) Primary Care Provider: Nadene Rubins Other Clinician: Referring Provider: Treating Provider/Extender: [name] in Treatment: 61 Information Obtained From Patient Constitutional Symptoms (General Health) Medical History: Past Medical History Notes: obseity Eyes Medical History: Positive for: Cataracts - removed Ear/Nose/Mouth/Throat Medical History: Past Medical History Notes: wears hearing  aids Cardiovascular Medical History: Positive for: Arrhythmia - Atrial Flutter/Atrial Fib; Coronary Artery Disease; Hypotension Negative for: Hypertension Past Medical History Notes: Mitral Valve Repair, Genitourinary Medical History: Past Medical History Notes: BPH Musculoskeletal Medical History: Positive for: Osteoarthritis Neurologic Medical History: Negative for: Paraplegia Past Medical History Notes: CVA 2018, left side hemiparesis HBO Extended History Items Eyes: Cataracts GABRIEL, RENDINA ([id number]) [id number] Page 9 of 10 Immunizations Pneumococcal Vaccine: Received Pneumococcal Vaccination: Yes Received Pneumococcal Vaccination On or After 60th Birthday: Yes Implantable Devices None Hospitalization / Surgery History Type of Hospitalization/Surgery open heart surgery IR generic histoircal appendectomy hernia repair joint replacement mitral valve repair mvp repair total hip arthroplasty Family and Social History Cancer: No; Diabetes: Yes - Maternal Grandparents; Heart Disease: Yes - Mother; Hereditary Spherocytosis: No; Hypertension: Yes - Mother; Kidney Disease: No; Lung Disease: No; Seizures: No; Stroke: No; Thyroid Problems: No; Tuberculosis: No; Never smoker; Marital Status - Married; Alcohol Use: Never; Drug Use: No History; Caffeine Use: Never; Financial Concerns: No; Food, Clothing or Shelter Needs: No; Support System Lacking: No; Transportation Concerns: No Electronic Signature(s) Signed: 04/17/2023 4:13:[name] PM By: Duanne Guess MD FACS Entered By: Duanne Guess on 04/17/2023 14:50:09 -------------------------------------------------------------------------------- SuperBill Details Patient Name: Date of Service: Brian [name] 04/17/2023 Medical Record Number: [id number] Patient Account Number: [id number] Date of Birth/Sex: Treating RN: Brian [name][birth year] (89 y.o. 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 I48.19 Other persistent atrial fibrillation I25.10 Atherosclerotic heart disease of native coronary artery without angina pectoris Facility Procedures : CPT4 Code: [id number] Description: 17250 - CHEM CAUT GRANULATION TISS ICD-10 Diagnosis Description L89.023 Pressure ulcer of left elbow, stage 3 Modifier: Quantity: 1 Physician Procedures : CPT4 Code Description Modifier [id number] - WC PHYS LEVEL 4 - EST PT [name]  ICD-10 Diagnosis Description L89.023 Pressure ulcer of left elbow, stage 3 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

## 2023-04-19 ENCOUNTER — Telehealth: Payer: Self-pay | Admitting: Family Medicine

## 2023-04-19 DIAGNOSIS — I4819 Other persistent atrial fibrillation: Secondary | ICD-10-CM | POA: Diagnosis not present

## 2023-04-19 DIAGNOSIS — Z7901 Long term (current) use of anticoagulants: Secondary | ICD-10-CM | POA: Diagnosis not present

## 2023-04-19 DIAGNOSIS — I2510 Atherosclerotic heart disease of native coronary artery without angina pectoris: Secondary | ICD-10-CM | POA: Diagnosis not present

## 2023-04-19 DIAGNOSIS — L89023 Pressure ulcer of left elbow, stage 3: Secondary | ICD-10-CM | POA: Diagnosis not present

## 2023-04-19 DIAGNOSIS — I119 Hypertensive heart disease without heart failure: Secondary | ICD-10-CM | POA: Diagnosis not present

## 2023-04-19 DIAGNOSIS — I69354 Hemiplegia and hemiparesis following cerebral infarction affecting left non-dominant side: Secondary | ICD-10-CM | POA: Diagnosis not present

## 2023-04-19 NOTE — Telephone Encounter (Signed)
Sherice from [facility] and would like to continue physical therapy for the pt. Sherice can be reached at [phone]

## 2023-04-20 ENCOUNTER — Telehealth (HOSPITAL_BASED_OUTPATIENT_CLINIC_OR_DEPARTMENT_OTHER): Payer: Self-pay

## 2023-04-20 ENCOUNTER — Telehealth: Payer: Self-pay

## 2023-04-20 DIAGNOSIS — L89023 Pressure ulcer of left elbow, stage 3: Secondary | ICD-10-CM | POA: Diagnosis not present

## 2023-04-20 DIAGNOSIS — I4819 Other persistent atrial fibrillation: Secondary | ICD-10-CM | POA: Diagnosis not present

## 2023-04-20 DIAGNOSIS — I2510 Atherosclerotic heart disease of native coronary artery without angina pectoris: Secondary | ICD-10-CM | POA: Diagnosis not present

## 2023-04-20 DIAGNOSIS — I69354 Hemiplegia and hemiparesis following cerebral infarction affecting left non-dominant side: Secondary | ICD-10-CM | POA: Diagnosis not present

## 2023-04-20 DIAGNOSIS — Z7901 Long term (current) use of anticoagulants: Secondary | ICD-10-CM | POA: Diagnosis not present

## 2023-04-20 DIAGNOSIS — I119 Hypertensive heart disease without heart failure: Secondary | ICD-10-CM | POA: Diagnosis not present

## 2023-04-20 NOTE — Telephone Encounter (Signed)
Agree with plan to continue PO Lasix. If feeling well, will simply keep Furoscix on hand for future use.   Alver Sorrow, NP

## 2023-04-20 NOTE — Telephone Encounter (Signed)
Spoke to pt son concerning Furoscix. Explained that it has been approved and they will have it shipped to him. Gave him number to call [facility] if they have questions.  Pt son verbalized thanks and understanding. He stated pt has been doing well on the PO Lasix as well.

## 2023-04-20 NOTE — Telephone Encounter (Signed)
Transition Care Management Follow-up Telephone Call Date of discharge and from where: Brian [name] 8/21 How have you been since you were released from the hospital? Doing fine  Any questions or concerns? No  Items Reviewed: Did the pt receive and understand the discharge instructions provided? Yes  Medications obtained and verified? No  Other? No  Any new allergies since your discharge? No  Dietary orders reviewed? No Do you have support at home? Yes     Follow up appointments reviewed:  PCP Hospital f/u appt confirmed? Yes  Scheduled to see  on  @ . Specialist Hospital f/u appt confirmed? Yes  Scheduled to see  on  @ . Are transportation arrangements needed? Yes  If their condition worsens, is the pt aware to call PCP or go to the Emergency Dept.? Yes Was the patient provided with contact information for the PCP's office or ED? Yes Was to pt encouraged to call back with questions or concerns? Yes

## 2023-04-22 DIAGNOSIS — I119 Hypertensive heart disease without heart failure: Secondary | ICD-10-CM | POA: Diagnosis not present

## 2023-04-22 DIAGNOSIS — Z86718 Personal history of other venous thrombosis and embolism: Secondary | ICD-10-CM | POA: Diagnosis not present

## 2023-04-22 DIAGNOSIS — I69354 Hemiplegia and hemiparesis following cerebral infarction affecting left non-dominant side: Secondary | ICD-10-CM | POA: Diagnosis not present

## 2023-04-22 DIAGNOSIS — Z7901 Long term (current) use of anticoagulants: Secondary | ICD-10-CM | POA: Diagnosis not present

## 2023-04-22 DIAGNOSIS — L89023 Pressure ulcer of left elbow, stage 3: Secondary | ICD-10-CM | POA: Diagnosis not present

## 2023-04-22 DIAGNOSIS — I4819 Other persistent atrial fibrillation: Secondary | ICD-10-CM | POA: Diagnosis not present

## 2023-04-22 DIAGNOSIS — I2510 Atherosclerotic heart disease of native coronary artery without angina pectoris: Secondary | ICD-10-CM | POA: Diagnosis not present

## 2023-04-22 DIAGNOSIS — M179 Osteoarthritis of knee, unspecified: Secondary | ICD-10-CM | POA: Diagnosis not present

## 2023-04-22 DIAGNOSIS — R1312 Dysphagia, oropharyngeal phase: Secondary | ICD-10-CM | POA: Diagnosis not present

## 2023-04-22 DIAGNOSIS — Z952 Presence of prosthetic heart valve: Secondary | ICD-10-CM | POA: Diagnosis not present

## 2023-04-22 DIAGNOSIS — Z79891 Long term (current) use of opiate analgesic: Secondary | ICD-10-CM | POA: Diagnosis not present

## 2023-04-24 ENCOUNTER — Ambulatory Visit (INDEPENDENT_AMBULATORY_CARE_PROVIDER_SITE_OTHER): Payer: Medicare Other | Admitting: Family

## 2023-04-24 ENCOUNTER — Encounter (HOSPITAL_BASED_OUTPATIENT_CLINIC_OR_DEPARTMENT_OTHER): Payer: Self-pay | Admitting: Family

## 2023-04-24 ENCOUNTER — Ambulatory Visit (HOSPITAL_BASED_OUTPATIENT_CLINIC_OR_DEPARTMENT_OTHER): Payer: Medicare Other | Admitting: Family

## 2023-04-24 VITALS — BP 101/66 | HR 65 | Wt 190.0 lb

## 2023-04-24 DIAGNOSIS — Z9889 Other specified postprocedural states: Secondary | ICD-10-CM

## 2023-04-24 DIAGNOSIS — I4819 Other persistent atrial fibrillation: Secondary | ICD-10-CM | POA: Diagnosis not present

## 2023-04-24 DIAGNOSIS — D6859 Other primary thrombophilia: Secondary | ICD-10-CM

## 2023-04-24 DIAGNOSIS — E785 Hyperlipidemia, unspecified: Secondary | ICD-10-CM

## 2023-04-24 DIAGNOSIS — I25118 Atherosclerotic heart disease of native coronary artery with other forms of angina pectoris: Secondary | ICD-10-CM | POA: Diagnosis not present

## 2023-04-24 DIAGNOSIS — R0609 Other forms of dyspnea: Secondary | ICD-10-CM | POA: Diagnosis not present

## 2023-04-24 DIAGNOSIS — I10 Essential (primary) hypertension: Secondary | ICD-10-CM | POA: Diagnosis not present

## 2023-04-24 NOTE — Telephone Encounter (Signed)
HH ORDERS   Caller Name: [facility] Agency Name: Iantha Fallen [facility] Callback Phone #: [phone] has secure VM  Service Requested: PT  Frequency of Visits: 1X WEEK FOR 4 WEEKS  Please call back with Verbal Orders. 2nd request.

## 2023-04-24 NOTE — Progress Notes (Signed)
Cardiology Office Note:  .   Date:  04/24/2023  ID:  [name], DOB [birth year], MRN [id number] PCP: Mliss Sax, MD  [facility] Providers Cardiologist:  Chilton Si, MD    History of Present Illness: .   KAMERIN MANWILLER is a 89 y.o. male with hx of persistent atrial fibrillation, GI bleed, prior CVA, HTN, prior MV repair at [facility] .  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 Warfarin switched to Eliquis.   Last seen by Dr. Duke Salvia 06/2021. Echo 02/2022 normal LVEF and RV function, moderately elevated PASP, indeterminate diastolic function, bilateral atria severely dilated, MR s/p repair with mild MR and mean gradient 3.6 mmHg, moderate AI without AS.  ED visit 04/11/23 after presenting with dyspnea and left chest pain. Difficult history as Mr. Youker is nonambulatory. CT with no PE but did show small L and trace R pleural effusion. Given 20mg  IV Lasix and discharged. HS troponin 19 ? 17. BNP 653.4. At visit 04/13/23 he was started on Lasix 20 mg daily and given prescription for as needed for Furoscix (SQ Furosemide) to be taken only as directed.   Presents today for follow up with son and aide. Notes L sided chest discomfort in L axillary which is tender on palpation is intermittent and similar to previous. Reassurance again provided atypical for angina. Reassurance provided that rate controlled atrial fibrillation not of concern and Eliquis is protective. Dyspnea and abdominal swelling much improved since addition of Furosemide.   ROS: Please see the history of present illness.    All other systems reviewed and are negative.   Studies Reviewed: .        Cardiac Studies & Procedures       ECHOCARDIOGRAM  ECHOCARDIOGRAM COMPLETE 03/15/2022  Narrative ECHOCARDIOGRAM REPORT    Patient Name:   Lebert GILBERTO BARSKI Date of Exam: 03/15/2022 Medical Rec #:  [id number]      Height:       68.0  in Accession #:    [id number]     Weight:       185.0 lb Date of Birth:  [birth year]      BSA:          1.977 m Patient Age:    89 years       BP:           125/64 mmHg Patient Gender: M              HR:           98 bpm. Exam Location:  Inpatient  Procedure: 2D Echo, Cardiac Doppler and Color Doppler  MODIFIED REPORT: This report was modified by Weston Brass MD on 03/15/2022 due to revision. Indications:     Syncope  History:         Patient has no prior history of Echocardiogram examinations. CAD, Arrythmias:Atrial Fibrillation; Risk Factors:Hypertension.  Mitral Valve: unknown size prosthetic annuloplasty ring valve is present in the mitral position. Procedure Date: 2002 [facility].  Sonographer:     Cleatis Polka Referring Phys:  [id number] Edsel Petrin M PATEL Diagnosing Phys: Weston Brass MD  IMPRESSIONS   1. Left ventricular ejection fraction, by estimation, is 55 to 60%. The left ventricle has normal function. The left ventricle has no regional wall motion abnormalities. There is mild left ventricular hypertrophy. Left ventricular diastolic parameters are indeterminate. 2. Right ventricular systolic function is normal. The right ventricular size is mildly enlarged.  There is moderately elevated pulmonary artery systolic pressure. The estimated right ventricular systolic pressure is 48.4 mmHg. 3. Left atrial size was severely dilated. 4. Right atrial size was severely dilated. 5. The mitral valve has been repaired/replaced. Mild mitral valve regurgitation. The mean mitral valve gradient is 3.6 mmHg with average heart rate of 70 bpm. There is a unknown size prosthetic annuloplasty ring present in the mitral position. Procedure Date: 2002 [facility]. Echo findings are consistent with normal structure and function of the mitral valve prosthesis. 6. Tricuspid valve regurgitation is severe. 7. The aortic valve is abnormal. There is mild calcification of the aortic valve. Aortic valve regurgitation  is moderate. No aortic stenosis is present. 8. The inferior vena cava is dilated in size with <50% respiratory variability, suggesting right atrial pressure of 15 mmHg.  FINDINGS Left Ventricle: Left ventricular ejection fraction, by estimation, is 55 to 60%. The left ventricle has normal function. The left ventricle has no regional wall motion abnormalities. The left ventricular internal cavity size was normal in size. There is mild left ventricular hypertrophy. Abnormal (paradoxical) septal motion consistent with post-operative status. Left ventricular diastolic parameters are indeterminate.  Right Ventricle: The right ventricular size is mildly enlarged. No increase in right ventricular wall thickness. Right ventricular systolic function is normal. There is moderately elevated pulmonary artery systolic pressure. The tricuspid regurgitant velocity is 2.89 m/s, and with an assumed right atrial pressure of 15 mmHg, the estimated right ventricular systolic pressure is 48.4 mmHg.  Left Atrium: Left atrial size was severely dilated.  Right Atrium: Right atrial size was severely dilated.  Pericardium: There is no evidence of pericardial effusion.  Mitral Valve: The mitral valve has been repaired/replaced. Mild mitral valve regurgitation. There is a unknown size prosthetic annuloplasty ring present in the mitral position. Procedure Date: 2002 [facility]. Echo findings are consistent with normal structure and function of the mitral valve prosthesis. The mean mitral valve gradient is 3.6 mmHg with average heart rate of 70 bpm.  Tricuspid Valve: The tricuspid valve is normal in structure. Tricuspid valve regurgitation is severe. No evidence of tricuspid stenosis.  Aortic Valve: The aortic valve is abnormal. There is mild calcification of the aortic valve. Aortic valve regurgitation is moderate. Aortic regurgitation PHT measures 558 msec. No aortic stenosis is present. Aortic valve peak gradient measures 9.5  mmHg.  Pulmonic Valve: The pulmonic valve was normal in structure. Pulmonic valve regurgitation is mild. No evidence of pulmonic stenosis.  Aorta: The aortic root is normal in size and structure. Ascending aorta measurements are within normal limits for age when indexed to body surface area.  Venous: The inferior vena cava is dilated in size with less than 50% respiratory variability, suggesting right atrial pressure of 15 mmHg.  IAS/Shunts: No atrial level shunt detected by color flow Doppler.   LEFT VENTRICLE PLAX 2D LVIDd:         3.90 cm      Diastology LVIDs:         3.00 cm      LV e' medial:    6.31 cm/s LV PW:         1.10 cm      LV E/e' medial:  25.0 LV IVS:        1.10 cm      LV e' lateral:   7.51 cm/s LVOT diam:     2.20 cm      LV E/e' lateral: 21.0 LV SV:  72 LV SV Index:   36 LVOT Area:     3.80 cm  LV Volumes (MOD) LV vol d, MOD A2C: 107.0 ml LV vol d, MOD A4C: 120.0 ml LV vol s, MOD A2C: 45.4 ml LV vol s, MOD A4C: 49.9 ml LV SV MOD A2C:     61.6 ml LV SV MOD A4C:     120.0 ml LV SV MOD BP:      68.1 ml  RIGHT VENTRICLE            IVC RV Basal diam:  4.30 cm    IVC diam: 2.20 cm RV S prime:     9.25 cm/s TAPSE (M-mode): 1.4 cm  LEFT ATRIUM             Index        RIGHT ATRIUM           Index LA diam:        4.50 cm 2.28 cm/m   RA Area:     18.70 cm LA Vol (A2C):   89.3 ml 45.17 ml/m  RA Volume:   48.60 ml  24.58 ml/m LA Vol (A4C):   72.5 ml 36.67 ml/m LA Biplane Vol: 85.0 ml 42.99 ml/m AORTIC VALVE AV Area (Vmax): 2.57 cm AV Vmax:        154.00 cm/s AV Peak Grad:   9.5 mmHg LVOT Vmax:      104.00 cm/s LVOT Vmean:     72.400 cm/s LVOT VTI:       0.189 m AI PHT:         558 msec  AORTA Ao Root diam: 3.70 cm Ao Asc diam:  3.40 cm  MITRAL VALVE                TRICUSPID VALVE MV Area (PHT): 5.09 cm     TR Peak grad:   33.4 mmHg MV Mean grad:  3.6 mmHg     TR Vmax:        289.00 cm/s MV Decel Time: 149 msec MV E velocity: 158.00  cm/s  SHUNTS Systemic VTI:  0.19 m Systemic Diam: 2.20 cm  Weston Brass MD Electronically signed by Weston Brass MD Signature Date/Time: 03/15/2022/4:12:04 PM    Final (Updated)             Risk Assessment/Calculations:    CHA2DS2-VASc Score = 7   This indicates a 11.2% annual risk of stroke. The patient's score is based upon: CHF History: 1 HTN History: 1 Diabetes History: 0 Stroke History: 2 Vascular Disease History: 1 Age Score: 2 Gender Score: 0            Physical Exam:   VS:  BP 101/66   Pulse 65   Wt 190 lb (86.2 kg)   SpO2 97%   BMI 31.62 kg/m    Wt Readings from Last 3 Encounters:  04/24/23 190 lb (86.2 kg)  04/11/23 190 lb (86.2 kg)  03/27/23 190 lb (86.2 kg)    GEN: Well nourished, overweight, well developed in no acute distress NECK: No JVD; No carotid bruits CARDIAC: IRIR, no murmurs, rubs, gallops RESPIRATORY:  Clear to auscultation without rales, wheezing or rhonchi  ABDOMEN: Soft, non-tender, non-distended EXTREMITIES:  No edema; No deformity   ASSESSMENT AND PLAN: .    Dypsnea / Elevated BNP / Diastolic dysfunction -ED visit 04/10/2023 with small right pleural effusion, BNP 600.  He was provided 20 mg IV Lasix.  At follow-up visit 04/13/2023 started on  p.o. furosemide 20 mg daily and given  PRN Furoscix. Dyspnea, abdominal swelling much improved.  Continue Lasix 20 mg daily.  Update BMP/BNP today. He or family/caregiver will contact for worsening dyspnea and could consider utilization of PRN Furoscix (SQ Furosemide). Given symptoms much improved, will defer repeat echo as low suspicion it would change our plan of care.   HTN - BP well controlled. Continue current antihypertensive regimen.    CAD / HLD - Left sided chest discomfort under axilla tender on palpation atypical for angina with no indication for ischemic evaluation. Reassurance again provided. CT 03/2023 minimal coronary artery and aortic artery atherosclerosis. GDMT Metoprolol,  Pravastatin. No ASA due to [facility].  S/p MV repair - Echo 02/2022 mild MR with  mean gradient 3.6 mmHg. No indication for repeat echo at this time.   Persistent atrial fibrillation / Hypercoagulable state - Rate controlled today. Continue Eliquis, Metoprolol. Unclear why on reduced dose Eliquis as weight >70kg and creatinine <1.5. Update BMP today. If creatinine persistently <1.5 consider adjusting dose based on labs today vs transition to Xarelto.        Dispo: follow up in 6 months  Signed, Alver Sorrow, NP

## 2023-04-24 NOTE — Patient Instructions (Signed)
Medication Instructions:  Your physician recommends that you continue on your current medications as directed. Please refer to the Current Medication list given to you today.  *If you need a refill on your cardiac medications before your next appointment, please call your pharmacy*   Lab Work: Your physician recommends that you return for lab work today- BMP/BNP   Follow-Up: At [facility], you and your health needs are our priority.  As part of our continuing mission to provide you with exceptional heart care, we have created designated Provider Care Teams.  These Care Teams include your primary Cardiologist (physician) and Advanced Practice Providers (APPs -  Physician Assistants and Nurse Practitioners) who all work together to provide you with the care you need, when you need it.  We recommend signing up for the patient portal called "MyChart".  Sign up information is provided on this After Visit Summary.  MyChart is used to connect with patients for Virtual Visits (Telemedicine).  Patients are able to view lab/test results, encounter notes, upcoming appointments, etc.  Non-urgent messages can be sent to your provider as well.   To learn more about what you can do with MyChart, go to [email or web address].    Your next appointment:   6 months with Dr. Duke Salvia or Gillian Shields, NP

## 2023-04-25 DIAGNOSIS — I2510 Atherosclerotic heart disease of native coronary artery without angina pectoris: Secondary | ICD-10-CM | POA: Diagnosis not present

## 2023-04-25 DIAGNOSIS — L89023 Pressure ulcer of left elbow, stage 3: Secondary | ICD-10-CM | POA: Diagnosis not present

## 2023-04-25 DIAGNOSIS — Z7901 Long term (current) use of anticoagulants: Secondary | ICD-10-CM | POA: Diagnosis not present

## 2023-04-25 DIAGNOSIS — I119 Hypertensive heart disease without heart failure: Secondary | ICD-10-CM | POA: Diagnosis not present

## 2023-04-25 DIAGNOSIS — I69354 Hemiplegia and hemiparesis following cerebral infarction affecting left non-dominant side: Secondary | ICD-10-CM | POA: Diagnosis not present

## 2023-04-25 DIAGNOSIS — I4819 Other persistent atrial fibrillation: Secondary | ICD-10-CM | POA: Diagnosis not present

## 2023-04-25 LAB — BASIC METABOLIC PANEL
BUN/Creatinine Ratio: 26 — ABNORMAL HIGH (ref 10–24)
BUN: 18 mg/dL (ref 8–27)
CO2: 25 mmol/L (ref 20–29)
Calcium: 8.7 mg/dL (ref 8.6–10.2)
Chloride: 102 mmol/L (ref 96–106)
Creatinine, Ser: 0.68 mg/dL — ABNORMAL LOW (ref 0.76–1.27)
Glucose: 88 mg/dL (ref 70–99)
Potassium: 4.1 mmol/L (ref 3.5–5.2)
Sodium: 141 mmol/L (ref 134–144)
eGFR: 89 mL/min/{1.73_m2} (ref >59)

## 2023-04-25 LAB — BRAIN NATRIURETIC PEPTIDE: BNP: 404 pg/mL — ABNORMAL HIGH (ref 0.0–100.0)

## 2023-04-27 DIAGNOSIS — I119 Hypertensive heart disease without heart failure: Secondary | ICD-10-CM | POA: Diagnosis not present

## 2023-04-27 DIAGNOSIS — Z7901 Long term (current) use of anticoagulants: Secondary | ICD-10-CM | POA: Diagnosis not present

## 2023-04-27 DIAGNOSIS — L89023 Pressure ulcer of left elbow, stage 3: Secondary | ICD-10-CM | POA: Diagnosis not present

## 2023-04-27 DIAGNOSIS — I2510 Atherosclerotic heart disease of native coronary artery without angina pectoris: Secondary | ICD-10-CM | POA: Diagnosis not present

## 2023-04-27 DIAGNOSIS — I69354 Hemiplegia and hemiparesis following cerebral infarction affecting left non-dominant side: Secondary | ICD-10-CM | POA: Diagnosis not present

## 2023-04-27 DIAGNOSIS — I4819 Other persistent atrial fibrillation: Secondary | ICD-10-CM | POA: Diagnosis not present

## 2023-04-30 DIAGNOSIS — I119 Hypertensive heart disease without heart failure: Secondary | ICD-10-CM | POA: Diagnosis not present

## 2023-04-30 DIAGNOSIS — I69354 Hemiplegia and hemiparesis following cerebral infarction affecting left non-dominant side: Secondary | ICD-10-CM | POA: Diagnosis not present

## 2023-04-30 DIAGNOSIS — I2510 Atherosclerotic heart disease of native coronary artery without angina pectoris: Secondary | ICD-10-CM | POA: Diagnosis not present

## 2023-04-30 DIAGNOSIS — Z7901 Long term (current) use of anticoagulants: Secondary | ICD-10-CM | POA: Diagnosis not present

## 2023-04-30 DIAGNOSIS — I4819 Other persistent atrial fibrillation: Secondary | ICD-10-CM | POA: Diagnosis not present

## 2023-04-30 DIAGNOSIS — L89023 Pressure ulcer of left elbow, stage 3: Secondary | ICD-10-CM | POA: Diagnosis not present

## 2023-05-02 DIAGNOSIS — L89023 Pressure ulcer of left elbow, stage 3: Secondary | ICD-10-CM | POA: Diagnosis not present

## 2023-05-02 DIAGNOSIS — I4819 Other persistent atrial fibrillation: Secondary | ICD-10-CM | POA: Diagnosis not present

## 2023-05-02 DIAGNOSIS — I2510 Atherosclerotic heart disease of native coronary artery without angina pectoris: Secondary | ICD-10-CM | POA: Diagnosis not present

## 2023-05-02 DIAGNOSIS — I69354 Hemiplegia and hemiparesis following cerebral infarction affecting left non-dominant side: Secondary | ICD-10-CM | POA: Diagnosis not present

## 2023-05-02 DIAGNOSIS — Z7901 Long term (current) use of anticoagulants: Secondary | ICD-10-CM | POA: Diagnosis not present

## 2023-05-02 DIAGNOSIS — I119 Hypertensive heart disease without heart failure: Secondary | ICD-10-CM | POA: Diagnosis not present

## 2023-05-03 DIAGNOSIS — L89023 Pressure ulcer of left elbow, stage 3: Secondary | ICD-10-CM | POA: Diagnosis not present

## 2023-05-03 DIAGNOSIS — I4819 Other persistent atrial fibrillation: Secondary | ICD-10-CM | POA: Diagnosis not present

## 2023-05-03 DIAGNOSIS — Z7901 Long term (current) use of anticoagulants: Secondary | ICD-10-CM | POA: Diagnosis not present

## 2023-05-03 DIAGNOSIS — I69354 Hemiplegia and hemiparesis following cerebral infarction affecting left non-dominant side: Secondary | ICD-10-CM | POA: Diagnosis not present

## 2023-05-03 DIAGNOSIS — I119 Hypertensive heart disease without heart failure: Secondary | ICD-10-CM | POA: Diagnosis not present

## 2023-05-03 DIAGNOSIS — I2510 Atherosclerotic heart disease of native coronary artery without angina pectoris: Secondary | ICD-10-CM | POA: Diagnosis not present

## 2023-05-07 DIAGNOSIS — Z7901 Long term (current) use of anticoagulants: Secondary | ICD-10-CM | POA: Diagnosis not present

## 2023-05-07 DIAGNOSIS — I69354 Hemiplegia and hemiparesis following cerebral infarction affecting left non-dominant side: Secondary | ICD-10-CM | POA: Diagnosis not present

## 2023-05-07 DIAGNOSIS — I4819 Other persistent atrial fibrillation: Secondary | ICD-10-CM | POA: Diagnosis not present

## 2023-05-07 DIAGNOSIS — I119 Hypertensive heart disease without heart failure: Secondary | ICD-10-CM | POA: Diagnosis not present

## 2023-05-07 DIAGNOSIS — I2510 Atherosclerotic heart disease of native coronary artery without angina pectoris: Secondary | ICD-10-CM | POA: Diagnosis not present

## 2023-05-07 DIAGNOSIS — L89023 Pressure ulcer of left elbow, stage 3: Secondary | ICD-10-CM | POA: Diagnosis not present

## 2023-05-08 ENCOUNTER — Encounter (HOSPITAL_BASED_OUTPATIENT_CLINIC_OR_DEPARTMENT_OTHER): Payer: Medicare Other | Attending: General Surgery | Admitting: General Surgery

## 2023-05-08 DIAGNOSIS — I119 Hypertensive heart disease without heart failure: Secondary | ICD-10-CM | POA: Diagnosis not present

## 2023-05-08 DIAGNOSIS — I2510 Atherosclerotic heart disease of native coronary artery without angina pectoris: Secondary | ICD-10-CM | POA: Diagnosis not present

## 2023-05-08 DIAGNOSIS — Z86718 Personal history of other venous thrombosis and embolism: Secondary | ICD-10-CM | POA: Diagnosis not present

## 2023-05-08 DIAGNOSIS — I4819 Other persistent atrial fibrillation: Secondary | ICD-10-CM | POA: Diagnosis not present

## 2023-05-08 DIAGNOSIS — I69354 Hemiplegia and hemiparesis following cerebral infarction affecting left non-dominant side: Secondary | ICD-10-CM | POA: Diagnosis not present

## 2023-05-08 DIAGNOSIS — Z8249 Family history of ischemic heart disease and other diseases of the circulatory system: Secondary | ICD-10-CM | POA: Diagnosis not present

## 2023-05-08 DIAGNOSIS — I10 Essential (primary) hypertension: Secondary | ICD-10-CM | POA: Diagnosis not present

## 2023-05-08 DIAGNOSIS — L89023 Pressure ulcer of left elbow, stage 3: Secondary | ICD-10-CM | POA: Insufficient documentation

## 2023-05-08 DIAGNOSIS — Z7901 Long term (current) use of anticoagulants: Secondary | ICD-10-CM | POA: Diagnosis not present

## 2023-05-08 NOTE — Progress Notes (Signed)
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 - Dr. Lady Gary Room 2 05/29/23 at 2:15pm Anesthetic (In clinic) Topical Lidocaine 4% applied to wound bed Bathing/ Shower/ Hygiene May shower and wash wound with soap and water. - with dressing changes Off-Loading Turn and reposition every 2 hours - stay off of left elbow wear prevalon boot on elbow as directed Other: - limit time in wheelchair, reposition in wheelchair every 1-2 hours Home Health No change in wound care orders this week; continue Home Health for wound care. May utilize formulary equivalent dressing for wound treatment orders unless otherwise specified. - physical therapy once a week, wound care twice a week Other Home Health Orders/Instructions: - YQMVHQI Wound Treatment Wound #1 - Elbow Wound Laterality: Left Cleanser: Soap and Water Every Other Day/30 Days Discharge Instructions: May shower and wash wound with dial antibacterial soap and water prior to dressing change. Cleanser: Wound Cleanser (Generic) Every Other Day/30 Days Discharge Instructions: Cleanse the wound with wound cleanser prior to applying a clean dressing using gauze sponges, not tissue or cotton balls. Topical: Gentamicin Every Other Day/30 Days Discharge Instructions: As directed by physician Topical: Mupirocin Ointment Every Other Day/30 Days Discharge Instructions: Apply Mupirocin (Bactroban)  as instructed Prim Dressing: Hydrofera Blue Ready Transfer Foam, 2.5x2.5 (in/in) Every Other Day/30 Days ary Discharge Instructions: Apply directly to wound bed as directed Secondary Dressing: ALLEVYN Gentle Border, 5x5 (in/in) (Generic) Every Other Day/30 Days Discharge Instructions: Apply over primary dressing as directed. Add-Ons: Cotton Tip Applicator, 6 (in) (Generic) Every Other Day/30 Days Electronic Signature(s) Signed: 05/08/2023 2:51:21 PM By: Duanne Guess MD FACS Entered By: Duanne Guess on 05/08/2023 11:25:43 -------------------------------------------------------------------------------- Problem List Details Patient Name: Date of Service: [name] ID M. 05/08/2023 1:45 PM Medical Record Number: [id number] Patient Account Number: [id number] Date of Birth/Sex: Treating RN: 10-Feb-1933 (89 y.o. 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 02/14/2022 No Yes I48.19 Other persistent atrial fibrillation 02/14/2022 No Yes I25.10 Atherosclerotic heart disease of native coronary artery without angina pectoris 02/14/2022 No Yes Inactive Problems ICD-10 Code Description Active Date Inactive Date L89.323 Pressure ulcer of left buttock, stage 3 05/23/2022 05/23/2022 Resolved Problems Electronic Signature(s) Signed: 05/08/2023 2:22:54 PM By: Duanne Guess MD FACS Entered By: Duanne Guess on 05/08/2023 11:22:53 -------------------------------------------------------------------------------- Progress Note Details Patient Name: Date of Service: [name]  ID M. 05/08/2023 1:45 PM Medical Record Number: [id number] Patient Account Number: [id number] Date of Birth/Sex: Treating RN: February 20, 1933 (89 y.o. M) Primary Care Provider: Nadene Rubins Other Clinician: Referring Provider: Treating Provider/Extender: [name] in Treatment: 64 Subjective Chief Complaint Information obtained from Patient 08/05/2019; patient is here for review of pressure ulcers on his buttock 02/14/22: patient is here for review of pressure ulcers on his left elbow History of  Nadene Rubins Other Clinician: Referring Provider: Treating Provider/Extender: [name] in Treatment: [address] [place] ([id number]) [id number] Page 4 of 10 Verbal / Phone Orders: No Diagnosis Coding ICD-10 Coding Code Description L89.023 Pressure ulcer of left elbow, stage 3 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 - Dr. Lady Gary Room 2 05/29/23 at 2:15pm Anesthetic (In clinic) Topical Lidocaine 4% applied to wound bed Bathing/ Shower/ Hygiene May shower and wash wound with soap and water. - with dressing changes Off-Loading Turn and reposition every 2 hours - stay off of left elbow wear prevalon boot on elbow as directed Other: - limit time in wheelchair, reposition in wheelchair every 1-2 hours Home Health No change in wound care orders this week; continue Home Health for wound care. May utilize formulary equivalent dressing for wound treatment orders unless otherwise specified. - physical therapy once a week, wound care twice a week Other Home Health Orders/Instructions: - YQMVHQI Wound Treatment Wound #1 - Elbow Wound Laterality: Left Cleanser: Soap and Water Every Other Day/30 Days Discharge Instructions: May shower and wash wound with dial antibacterial soap and water prior to dressing change. Cleanser: Wound Cleanser (Generic) Every Other Day/30 Days Discharge Instructions: Cleanse the wound with wound cleanser prior to applying a clean dressing using gauze sponges, not tissue or cotton balls. Topical: Gentamicin Every Other Day/30 Days Discharge Instructions: As directed by physician Topical: Mupirocin Ointment Every Other Day/30 Days Discharge Instructions: Apply Mupirocin (Bactroban)  as instructed Prim Dressing: Hydrofera Blue Ready Transfer Foam, 2.5x2.5 (in/in) Every Other Day/30 Days ary Discharge Instructions: Apply directly to wound bed as directed Secondary Dressing: ALLEVYN Gentle Border, 5x5 (in/in) (Generic) Every Other Day/30 Days Discharge Instructions: Apply over primary dressing as directed. Add-Ons: Cotton Tip Applicator, 6 (in) (Generic) Every Other Day/30 Days Electronic Signature(s) Signed: 05/08/2023 2:51:21 PM By: Duanne Guess MD FACS Entered By: Duanne Guess on 05/08/2023 11:25:43 -------------------------------------------------------------------------------- Problem List Details Patient Name: Date of Service: [name] ID M. 05/08/2023 1:45 PM Medical Record Number: [id number] Patient Account Number: [id number] Date of Birth/Sex: Treating RN: 10-Feb-1933 (89 y.o. 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 02/14/2022 No Yes I48.19 Other persistent atrial fibrillation 02/14/2022 No Yes I25.10 Atherosclerotic heart disease of native coronary artery without angina pectoris 02/14/2022 No Yes Inactive Problems ICD-10 Code Description Active Date Inactive Date L89.323 Pressure ulcer of left buttock, stage 3 05/23/2022 05/23/2022 Resolved Problems Electronic Signature(s) Signed: 05/08/2023 2:22:54 PM By: Duanne Guess MD FACS Entered By: Duanne Guess on 05/08/2023 11:22:53 -------------------------------------------------------------------------------- Progress Note Details Patient Name: Date of Service: [name]  ID M. 05/08/2023 1:45 PM Medical Record Number: [id number] Patient Account Number: [id number] Date of Birth/Sex: Treating RN: February 20, 1933 (89 y.o. M) Primary Care Provider: Nadene Rubins Other Clinician: Referring Provider: Treating Provider/Extender: [name] in Treatment: 66 Subjective Chief Complaint Information obtained from Patient 08/05/2019; patient is here for review of pressure ulcers on his buttock 02/14/22: patient is here for review of pressure ulcers on his left elbow History of  clinic) Topical Lidocaine 4% applied to wound bed Bathing/ Shower/ Hygiene: May shower and wash wound with soap and water. - with dressing changes Off-Loading: Turn and reposition every 2 hours - stay off of left elbow wear prevalon boot on elbow as directed Other: - limit time in wheelchair, reposition in wheelchair every 1-2 hours GUMERCINDO, BISSEY ([id number]) [phone].pdf Page 8 of 10 Home Health: No change in wound care orders this week; continue Home Health for wound care. May utilize formulary equivalent dressing for wound treatment orders unless otherwise specified. - physical therapy once a week, wound care twice a week Other Home Health Orders/Instructions: - UKGURKY WOUND #1: - Elbow Wound Laterality: Left Cleanser: Soap and Water Every Other Day/30 Days Discharge Instructions: May shower and wash wound with dial antibacterial soap and water prior to dressing change. Cleanser: Wound Cleanser (Generic) Every Other Day/30 Days Discharge Instructions: Cleanse the wound with wound cleanser prior to applying a clean dressing using gauze sponges, not tissue or cotton balls. Topical: Gentamicin Every Other Day/30 Days Discharge Instructions: As directed by physician Topical: Mupirocin Ointment Every Other Day/30 Days Discharge Instructions: Apply Mupirocin (Bactroban) as instructed Prim Dressing: Hydrofera Blue Ready Transfer Foam, 2.5x2.5 (in/in) Every Other Day/30 Days ary Discharge Instructions: Apply directly to wound bed as directed Secondary Dressing: ALLEVYN Gentle Border, 5x5 (in/in) (Generic) Every Other Day/30 Days Discharge Instructions: Apply over primary dressing as directed. Add-Ons: Cotton Tip Applicator, 6 (in) (Generic) Every Other Day/30 Days 05/08/2023: The wound continues to contract. No tunneling is present at this point. The hypertrophic granulation tissue has reaccumulated, but not to the extent that was present at  his last visit. The erythema of the periwound has resolved is less tender. I used a curette to debride slough and eschar from the wound. I then chemically cauterized the hypertrophic granulation tissue with silver nitrate. We will continue the mixture of topical gentamicin and mupirocin with Hydrofera Blue and a foam heel cup. The patient and his family member, along with his caregiver, were all reminded of the importance of keeping him from leaning on this elbow. Follow-up in about 3 weeks. Electronic Signature(s) Signed: 05/08/2023 2:26:50 PM By: Duanne Guess MD FACS Entered By: Duanne Guess on 05/08/2023 11:26:49 -------------------------------------------------------------------------------- HxROS Details Patient Name: Date of Service: GO RDO Dorris Carnes, DA V ID M. 05/08/2023 1:45 PM Medical Record Number: [id number] Patient Account Number: [id number] Date of Birth/Sex: Treating RN: 1932-12-24 (89 y.o. M) Primary Care Provider: Nadene Rubins Other Clinician: Referring Provider: Treating Provider/Extender: [name] in Treatment: 66 Information Obtained From Patient Constitutional Symptoms (General Health) Medical History: Past Medical History Notes: obseity Eyes Medical History: Positive for: Cataracts - removed Ear/Nose/Mouth/Throat Medical History: Past Medical History Notes: wears hearing aids Cardiovascular Medical History: Positive for: Arrhythmia - Atrial Flutter/Atrial Fib; Coronary Artery Disease; Hypotension Negative for: Hypertension Past Medical History Notes: Mitral Valve Repair, Genitourinary Roger Shelter, Teena Irani ([id number]) [id number] Page 9 of 10 Medical History: Past Medical History Notes: BPH Musculoskeletal Medical History: Positive for: Osteoarthritis Neurologic Medical History: Negative for: Paraplegia Past Medical History Notes: CVA 2018, left side hemiparesis HBO Extended History  Items Eyes: Cataracts Immunizations Pneumococcal Vaccine: Received Pneumococcal Vaccination: Yes Received Pneumococcal Vaccination On or After 60th Birthday: Yes Implantable Devices None Hospitalization / Surgery History Type of Hospitalization/Surgery open heart surgery IR generic histoircal appendectomy hernia repair joint replacement mitral valve repair mvp repair total hip arthroplasty Family and Social History Cancer: No; Diabetes: Yes - Maternal Grandparents; Heart Disease: Yes - Mother; Hereditary  Spherocytosis: No; Hypertension: Yes - Mother; Kidney Disease: No; Lung Disease: No; Seizures: No; Stroke: No; Thyroid Problems: No; Tuberculosis: No; Never smoker; Marital Status - Married; Alcohol Use: Never; Drug Use: No History; Caffeine Use: Never; Financial Concerns: No; Food, Clothing or Shelter Needs: No; Support System Lacking: No; Transportation Concerns: No Electronic Signature(s) Signed: 05/08/2023 2:51:21 PM By: Duanne Guess MD FACS Entered By: Duanne Guess on 05/08/2023 11:24:56 -------------------------------------------------------------------------------- SuperBill Details Patient Name: Date of Service: GO RDO Dorris Carnes, DA V ID M. 05/08/2023 Medical Record Number: [id number] Patient Account Number: [id number] Date of Birth/Sex: Treating RN: 1933/01/30 (89 y.o. 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 I48.19 Other persistent atrial fibrillation [name] ([id number]) [phone].pdf Page 10 of 10 I25.10 Atherosclerotic heart disease of native coronary artery without angina pectoris Facility Procedures : CPT4 Code: [id number] Description:  7597 - DEBRIDE WOUND 1ST 20 SQ CM OR < ICD-10 Diagnosis Description L89.023 Pressure ulcer of left elbow, stage 3 Modifier: Quantity: 1 Physician Procedures : CPT4 Code Description Modifier [id number] - WC PHYS LEVEL 4 - EST PT 25 ICD-10 Diagnosis Description L89.023 Pressure ulcer of left elbow, stage 3 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 I48.19 Other persistent  atrial fibrillation I25.10 Atherosclerotic heart disease of native coronary artery without angina pectoris Quantity: 1 : [id number] - WC PHYS DEBR WO ANESTH 20 SQ CM ICD-10 Diagnosis Description L89.023 Pressure ulcer of left elbow, stage 3 Quantity: 1 Electronic Signature(s) Signed: 05/08/2023 2:27:10 PM By: Duanne Guess MD FACS Entered By: Duanne Guess on 05/08/2023 11:27:09  Nadene Rubins Other Clinician: Referring Provider: Treating Provider/Extender: [name] in Treatment: [address] [place] ([id number]) [id number] Page 4 of 10 Verbal / Phone Orders: No Diagnosis Coding ICD-10 Coding Code Description L89.023 Pressure ulcer of left elbow, stage 3 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 - Dr. Lady Gary Room 2 05/29/23 at 2:15pm Anesthetic (In clinic) Topical Lidocaine 4% applied to wound bed Bathing/ Shower/ Hygiene May shower and wash wound with soap and water. - with dressing changes Off-Loading Turn and reposition every 2 hours - stay off of left elbow wear prevalon boot on elbow as directed Other: - limit time in wheelchair, reposition in wheelchair every 1-2 hours Home Health No change in wound care orders this week; continue Home Health for wound care. May utilize formulary equivalent dressing for wound treatment orders unless otherwise specified. - physical therapy once a week, wound care twice a week Other Home Health Orders/Instructions: - YQMVHQI Wound Treatment Wound #1 - Elbow Wound Laterality: Left Cleanser: Soap and Water Every Other Day/30 Days Discharge Instructions: May shower and wash wound with dial antibacterial soap and water prior to dressing change. Cleanser: Wound Cleanser (Generic) Every Other Day/30 Days Discharge Instructions: Cleanse the wound with wound cleanser prior to applying a clean dressing using gauze sponges, not tissue or cotton balls. Topical: Gentamicin Every Other Day/30 Days Discharge Instructions: As directed by physician Topical: Mupirocin Ointment Every Other Day/30 Days Discharge Instructions: Apply Mupirocin (Bactroban)  as instructed Prim Dressing: Hydrofera Blue Ready Transfer Foam, 2.5x2.5 (in/in) Every Other Day/30 Days ary Discharge Instructions: Apply directly to wound bed as directed Secondary Dressing: ALLEVYN Gentle Border, 5x5 (in/in) (Generic) Every Other Day/30 Days Discharge Instructions: Apply over primary dressing as directed. Add-Ons: Cotton Tip Applicator, 6 (in) (Generic) Every Other Day/30 Days Electronic Signature(s) Signed: 05/08/2023 2:51:21 PM By: Duanne Guess MD FACS Entered By: Duanne Guess on 05/08/2023 11:25:43 -------------------------------------------------------------------------------- Problem List Details Patient Name: Date of Service: [name] ID M. 05/08/2023 1:45 PM Medical Record Number: [id number] Patient Account Number: [id number] Date of Birth/Sex: Treating RN: 10-Feb-1933 (89 y.o. 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 02/14/2022 No Yes I48.19 Other persistent atrial fibrillation 02/14/2022 No Yes I25.10 Atherosclerotic heart disease of native coronary artery without angina pectoris 02/14/2022 No Yes Inactive Problems ICD-10 Code Description Active Date Inactive Date L89.323 Pressure ulcer of left buttock, stage 3 05/23/2022 05/23/2022 Resolved Problems Electronic Signature(s) Signed: 05/08/2023 2:22:54 PM By: Duanne Guess MD FACS Entered By: Duanne Guess on 05/08/2023 11:22:53 -------------------------------------------------------------------------------- Progress Note Details Patient Name: Date of Service: [name]  ID M. 05/08/2023 1:45 PM Medical Record Number: [id number] Patient Account Number: [id number] Date of Birth/Sex: Treating RN: February 20, 1933 (89 y.o. M) Primary Care Provider: Nadene Rubins Other Clinician: Referring Provider: Treating Provider/Extender: [name] in Treatment: 64 Subjective Chief Complaint Information obtained from Patient 08/05/2019; patient is here for review of pressure ulcers on his buttock 02/14/22: patient is here for review of pressure ulcers on his left elbow History of  Nadene Rubins Other Clinician: Referring Provider: Treating Provider/Extender: [name] in Treatment: [address] [place] ([id number]) [id number] Page 4 of 10 Verbal / Phone Orders: No Diagnosis Coding ICD-10 Coding Code Description L89.023 Pressure ulcer of left elbow, stage 3 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 - Dr. Lady Gary Room 2 05/29/23 at 2:15pm Anesthetic (In clinic) Topical Lidocaine 4% applied to wound bed Bathing/ Shower/ Hygiene May shower and wash wound with soap and water. - with dressing changes Off-Loading Turn and reposition every 2 hours - stay off of left elbow wear prevalon boot on elbow as directed Other: - limit time in wheelchair, reposition in wheelchair every 1-2 hours Home Health No change in wound care orders this week; continue Home Health for wound care. May utilize formulary equivalent dressing for wound treatment orders unless otherwise specified. - physical therapy once a week, wound care twice a week Other Home Health Orders/Instructions: - YQMVHQI Wound Treatment Wound #1 - Elbow Wound Laterality: Left Cleanser: Soap and Water Every Other Day/30 Days Discharge Instructions: May shower and wash wound with dial antibacterial soap and water prior to dressing change. Cleanser: Wound Cleanser (Generic) Every Other Day/30 Days Discharge Instructions: Cleanse the wound with wound cleanser prior to applying a clean dressing using gauze sponges, not tissue or cotton balls. Topical: Gentamicin Every Other Day/30 Days Discharge Instructions: As directed by physician Topical: Mupirocin Ointment Every Other Day/30 Days Discharge Instructions: Apply Mupirocin (Bactroban)  as instructed Prim Dressing: Hydrofera Blue Ready Transfer Foam, 2.5x2.5 (in/in) Every Other Day/30 Days ary Discharge Instructions: Apply directly to wound bed as directed Secondary Dressing: ALLEVYN Gentle Border, 5x5 (in/in) (Generic) Every Other Day/30 Days Discharge Instructions: Apply over primary dressing as directed. Add-Ons: Cotton Tip Applicator, 6 (in) (Generic) Every Other Day/30 Days Electronic Signature(s) Signed: 05/08/2023 2:51:21 PM By: Duanne Guess MD FACS Entered By: Duanne Guess on 05/08/2023 11:25:43 -------------------------------------------------------------------------------- Problem List Details Patient Name: Date of Service: [name] ID M. 05/08/2023 1:45 PM Medical Record Number: [id number] Patient Account Number: [id number] Date of Birth/Sex: Treating RN: 10-Feb-1933 (89 y.o. 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 02/14/2022 No Yes I48.19 Other persistent atrial fibrillation 02/14/2022 No Yes I25.10 Atherosclerotic heart disease of native coronary artery without angina pectoris 02/14/2022 No Yes Inactive Problems ICD-10 Code Description Active Date Inactive Date L89.323 Pressure ulcer of left buttock, stage 3 05/23/2022 05/23/2022 Resolved Problems Electronic Signature(s) Signed: 05/08/2023 2:22:54 PM By: Duanne Guess MD FACS Entered By: Duanne Guess on 05/08/2023 11:22:53 -------------------------------------------------------------------------------- Progress Note Details Patient Name: Date of Service: [name]  ID M. 05/08/2023 1:45 PM Medical Record Number: [id number] Patient Account Number: [id number] Date of Birth/Sex: Treating RN: February 20, 1933 (89 y.o. M) Primary Care Provider: Nadene Rubins Other Clinician: Referring Provider: Treating Provider/Extender: [name] in Treatment: 64 Subjective Chief Complaint Information obtained from Patient 08/05/2019; patient is here for review of pressure ulcers on his buttock 02/14/22: patient is here for review of pressure ulcers on his left elbow History of  Nadene Rubins Other Clinician: Referring Provider: Treating Provider/Extender: [name] in Treatment: [address] [place] ([id number]) [id number] Page 4 of 10 Verbal / Phone Orders: No Diagnosis Coding ICD-10 Coding Code Description L89.023 Pressure ulcer of left elbow, stage 3 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 - Dr. Lady Gary Room 2 05/29/23 at 2:15pm Anesthetic (In clinic) Topical Lidocaine 4% applied to wound bed Bathing/ Shower/ Hygiene May shower and wash wound with soap and water. - with dressing changes Off-Loading Turn and reposition every 2 hours - stay off of left elbow wear prevalon boot on elbow as directed Other: - limit time in wheelchair, reposition in wheelchair every 1-2 hours Home Health No change in wound care orders this week; continue Home Health for wound care. May utilize formulary equivalent dressing for wound treatment orders unless otherwise specified. - physical therapy once a week, wound care twice a week Other Home Health Orders/Instructions: - YQMVHQI Wound Treatment Wound #1 - Elbow Wound Laterality: Left Cleanser: Soap and Water Every Other Day/30 Days Discharge Instructions: May shower and wash wound with dial antibacterial soap and water prior to dressing change. Cleanser: Wound Cleanser (Generic) Every Other Day/30 Days Discharge Instructions: Cleanse the wound with wound cleanser prior to applying a clean dressing using gauze sponges, not tissue or cotton balls. Topical: Gentamicin Every Other Day/30 Days Discharge Instructions: As directed by physician Topical: Mupirocin Ointment Every Other Day/30 Days Discharge Instructions: Apply Mupirocin (Bactroban)  as instructed Prim Dressing: Hydrofera Blue Ready Transfer Foam, 2.5x2.5 (in/in) Every Other Day/30 Days ary Discharge Instructions: Apply directly to wound bed as directed Secondary Dressing: ALLEVYN Gentle Border, 5x5 (in/in) (Generic) Every Other Day/30 Days Discharge Instructions: Apply over primary dressing as directed. Add-Ons: Cotton Tip Applicator, 6 (in) (Generic) Every Other Day/30 Days Electronic Signature(s) Signed: 05/08/2023 2:51:21 PM By: Duanne Guess MD FACS Entered By: Duanne Guess on 05/08/2023 11:25:43 -------------------------------------------------------------------------------- Problem List Details Patient Name: Date of Service: [name] ID M. 05/08/2023 1:45 PM Medical Record Number: [id number] Patient Account Number: [id number] Date of Birth/Sex: Treating RN: 10-Feb-1933 (89 y.o. 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 02/14/2022 No Yes I48.19 Other persistent atrial fibrillation 02/14/2022 No Yes I25.10 Atherosclerotic heart disease of native coronary artery without angina pectoris 02/14/2022 No Yes Inactive Problems ICD-10 Code Description Active Date Inactive Date L89.323 Pressure ulcer of left buttock, stage 3 05/23/2022 05/23/2022 Resolved Problems Electronic Signature(s) Signed: 05/08/2023 2:22:54 PM By: Duanne Guess MD FACS Entered By: Duanne Guess on 05/08/2023 11:22:53 -------------------------------------------------------------------------------- Progress Note Details Patient Name: Date of Service: [name]  ID M. 05/08/2023 1:45 PM Medical Record Number: [id number] Patient Account Number: [id number] Date of Birth/Sex: Treating RN: February 20, 1933 (89 y.o. M) Primary Care Provider: Nadene Rubins Other Clinician: Referring Provider: Treating Provider/Extender: [name] in Treatment: 64 Subjective Chief Complaint Information obtained from Patient 08/05/2019; patient is here for review of pressure ulcers on his buttock 02/14/22: patient is here for review of pressure ulcers on his left elbow History of  Nadene Rubins Other Clinician: Referring Provider: Treating Provider/Extender: [name] in Treatment: [address] [place] Judie Petit ([id number]) [id number] Page 4 of 10 Verbal / Phone Orders: No Diagnosis Coding ICD-10 Coding Code Description L89.023 Pressure ulcer of left elbow, stage 3 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 - Dr. Lady Gary Room 2 05/29/23 at 2:15pm Anesthetic (In clinic) Topical Lidocaine 4% applied to wound bed Bathing/ Shower/ Hygiene May shower and wash wound with soap and water. - with dressing changes Off-Loading Turn and reposition every 2 hours - stay off of left elbow wear prevalon boot on elbow as directed Other: - limit time in wheelchair, reposition in wheelchair every 1-2 hours Home Health No change in wound care orders this week; continue Home Health for wound care. May utilize formulary equivalent dressing for wound treatment orders unless otherwise specified. - physical therapy once a week, wound care twice a week Other Home Health Orders/Instructions: - YQMVHQI Wound Treatment Wound #1 - Elbow Wound Laterality: Left Cleanser: Soap and Water Every Other Day/30 Days Discharge Instructions: May shower and wash wound with dial antibacterial soap and water prior to dressing change. Cleanser: Wound Cleanser (Generic) Every Other Day/30 Days Discharge Instructions: Cleanse the wound with wound cleanser prior to applying a clean dressing using gauze sponges, not tissue or cotton balls. Topical: Gentamicin Every Other Day/30 Days Discharge Instructions: As directed by physician Topical: Mupirocin Ointment Every Other Day/30 Days Discharge Instructions: Apply Mupirocin (Bactroban)  as instructed Prim Dressing: Hydrofera Blue Ready Transfer Foam, 2.5x2.5 (in/in) Every Other Day/30 Days ary Discharge Instructions: Apply directly to wound bed as directed Secondary Dressing: ALLEVYN Gentle Border, 5x5 (in/in) (Generic) Every Other Day/30 Days Discharge Instructions: Apply over primary dressing as directed. Add-Ons: Cotton Tip Applicator, 6 (in) (Generic) Every Other Day/30 Days Electronic Signature(s) Signed: 05/08/2023 2:51:21 PM By: Duanne Guess MD FACS Entered By: Duanne Guess on 05/08/2023 11:25:43 -------------------------------------------------------------------------------- Problem List Details Patient Name: Date of Service: [name] ID M. 05/08/2023 1:45 PM Medical Record Number: [id number] Patient Account Number: [id number] Date of Birth/Sex: Treating RN: 10-Feb-1933 (89 y.o. 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 02/14/2022 No Yes I48.19 Other persistent atrial fibrillation 02/14/2022 No Yes I25.10 Atherosclerotic heart disease of native coronary artery without angina pectoris 02/14/2022 No Yes Inactive Problems ICD-10 Code Description Active Date Inactive Date L89.323 Pressure ulcer of left buttock, stage 3 05/23/2022 05/23/2022 Resolved Problems Electronic Signature(s) Signed: 05/08/2023 2:22:54 PM By: Duanne Guess MD FACS Entered By: Duanne Guess on 05/08/2023 11:22:53 -------------------------------------------------------------------------------- Progress Note Details Patient Name: Date of Service: [name]  ID M. 05/08/2023 1:45 PM Medical Record Number: [id number] Patient Account Number: [id number] Date of Birth/Sex: Treating RN: February 20, 1933 (89 y.o. M) Primary Care Provider: Nadene Rubins Other Clinician: Referring Provider: Treating Provider/Extender: [name] in Treatment: 66 Subjective Chief Complaint Information obtained from Patient 08/05/2019; patient is here for review of pressure ulcers on his buttock 02/14/22: patient is here for review of pressure ulcers on his left elbow History of  clinic) Topical Lidocaine 4% applied to wound bed Bathing/ Shower/ Hygiene: May shower and wash wound with soap and water. - with dressing changes Off-Loading: Turn and reposition every 2 hours - stay off of left elbow wear prevalon boot on elbow as directed Other: - limit time in wheelchair, reposition in wheelchair every 1-2 hours GUMERCINDO, BISSEY ([id number]) [phone].pdf Page 8 of 10 Home Health: No change in wound care orders this week; continue Home Health for wound care. May utilize formulary equivalent dressing for wound treatment orders unless otherwise specified. - physical therapy once a week, wound care twice a week Other Home Health Orders/Instructions: - UKGURKY WOUND #1: - Elbow Wound Laterality: Left Cleanser: Soap and Water Every Other Day/30 Days Discharge Instructions: May shower and wash wound with dial antibacterial soap and water prior to dressing change. Cleanser: Wound Cleanser (Generic) Every Other Day/30 Days Discharge Instructions: Cleanse the wound with wound cleanser prior to applying a clean dressing using gauze sponges, not tissue or cotton balls. Topical: Gentamicin Every Other Day/30 Days Discharge Instructions: As directed by physician Topical: Mupirocin Ointment Every Other Day/30 Days Discharge Instructions: Apply Mupirocin (Bactroban) as instructed Prim Dressing: Hydrofera Blue Ready Transfer Foam, 2.5x2.5 (in/in) Every Other Day/30 Days ary Discharge Instructions: Apply directly to wound bed as directed Secondary Dressing: ALLEVYN Gentle Border, 5x5 (in/in) (Generic) Every Other Day/30 Days Discharge Instructions: Apply over primary dressing as directed. Add-Ons: Cotton Tip Applicator, 6 (in) (Generic) Every Other Day/30 Days 05/08/2023: The wound continues to contract. No tunneling is present at this point. The hypertrophic granulation tissue has reaccumulated, but not to the extent that was present at  his last visit. The erythema of the periwound has resolved is less tender. I used a curette to debride slough and eschar from the wound. I then chemically cauterized the hypertrophic granulation tissue with silver nitrate. We will continue the mixture of topical gentamicin and mupirocin with Hydrofera Blue and a foam heel cup. The patient and his family member, along with his caregiver, were all reminded of the importance of keeping him from leaning on this elbow. Follow-up in about 3 weeks. Electronic Signature(s) Signed: 05/08/2023 2:26:50 PM By: Duanne Guess MD FACS Entered By: Duanne Guess on 05/08/2023 11:26:49 -------------------------------------------------------------------------------- HxROS Details Patient Name: Date of Service: GO RDO Dorris Carnes, DA V ID M. 05/08/2023 1:45 PM Medical Record Number: [id number] Patient Account Number: [id number] Date of Birth/Sex: Treating RN: 1932-12-24 (89 y.o. M) Primary Care Provider: Nadene Rubins Other Clinician: Referring Provider: Treating Provider/Extender: [name] in Treatment: 66 Information Obtained From Patient Constitutional Symptoms (General Health) Medical History: Past Medical History Notes: obseity Eyes Medical History: Positive for: Cataracts - removed Ear/Nose/Mouth/Throat Medical History: Past Medical History Notes: wears hearing aids Cardiovascular Medical History: Positive for: Arrhythmia - Atrial Flutter/Atrial Fib; Coronary Artery Disease; Hypotension Negative for: Hypertension Past Medical History Notes: Mitral Valve Repair, Genitourinary Roger Shelter, Teena Irani ([id number]) [id number] Page 9 of 10 Medical History: Past Medical History Notes: BPH Musculoskeletal Medical History: Positive for: Osteoarthritis Neurologic Medical History: Negative for: Paraplegia Past Medical History Notes: CVA 2018, left side hemiparesis HBO Extended History  Items Eyes: Cataracts Immunizations Pneumococcal Vaccine: Received Pneumococcal Vaccination: Yes Received Pneumococcal Vaccination On or After 60th Birthday: Yes Implantable Devices None Hospitalization / Surgery History Type of Hospitalization/Surgery open heart surgery IR generic histoircal appendectomy hernia repair joint replacement mitral valve repair mvp repair total hip arthroplasty Family and Social History Cancer: No; Diabetes: Yes - Maternal Grandparents; Heart Disease: Yes - Mother; Hereditary

## 2023-05-08 NOTE — Progress Notes (Signed)
11:02:43 -------------------------------------------------------------------------------- Patient/Caregiver Education Details Patient Name: Date of Service: Brian Brian Brian [name] ID Brian [name] 9/17/2024andnbsp1:45 PM Medical Record Number: [id number] Patient Account Number: [id number] Date of Birth/Gender: Treating RN: 12/23/1932 (89 y.o. Brian Brian [name] Primary Care Physician: Brian Brian [name] Other Clinician: Referring Physician: Treating Physician/Extender: Brian Brian [name] in Treatment: 50 Education  Assessment Education Provided To: Patient Education Topics Provided Wound/Skin Impairment: Methods: Explain/Verbal Responses: State content correctly Electronic Signature(s) Signed: 05/08/2023 4:43:16 PM By: Brian Schwalbe RN Entered By: Brian Brian [name] on 05/08/2023 13:36:11 -------------------------------------------------------------------------------- Wound Assessment Details Patient Name: Date of Service: Brian Brian Brian [name] ID Brian [name] 05/08/2023 1:45 PM Medical Record Number: [id number] Patient Account Number: [id number] Date of Birth/Sex: Treating RN: 03/11/1933 (89 y.o. Brian [name]) Primary Care Brian Brian [name]: Brian Brian [name] Other Clinician: Referring Brian Brian [name]: Treating Brian Brian [name]/Extender: Brian Brian [name] in Treatment: 64 Wound Status Wound Number: 1 Primary Pressure Ulcer Etiology: Wound Location: Left Elbow Wound Status: Open Wounding Event: Pressure Injury Comorbid Cataracts, Arrhythmia, Coronary Artery Disease, Date Acquired: 12/19/2021 History: Hypotension, Osteoarthritis Weeks Of Treatment: 64 Clustered Wound: Yes Photos [place], Brian Brian [name] ([id number]) [id number] Page 6 of 7 Wound Measurements Length: (cm) 1 Width: (cm) 0.9 Depth: (cm) 0.1 Area: (cm) 0.707 Volume: (cm) 0.071 % Reduction in Area: 92.7% % Reduction in Volume: 92.6% Epithelialization: Medium (34-66%) Tunneling: No Undermining: No Wound Description Classification: Category/Stage III Wound Margin: Distinct, outline attached Exudate Amount: Medium Exudate Type: Serosanguineous Exudate Color: red, brown Foul Odor After Cleansing: No Slough/Fibrino Yes Wound Bed Granulation Amount: Large (67-100%) Exposed Structure Granulation Quality: Red, Hyper-granulation Fascia Exposed: No Necrotic Amount: Small (1-33%) Fat Layer (Subcutaneous Tissue) Exposed: Yes Necrotic Quality: Eschar, Adherent Slough Tendon Exposed: No Muscle Exposed: No Joint Exposed: No Bone Exposed:  No Periwound Skin Texture Texture Color No Abnormalities Noted: Yes No Abnormalities Noted: Yes Moisture Temperature / Pain No Abnormalities Noted: Yes Temperature: No Abnormality Tenderness on Palpation: Yes Treatment Notes Wound #1 (Elbow) Wound Laterality: Left Cleanser Soap and Water Discharge Instruction: May shower and wash wound with dial antibacterial soap and water prior to dressing change. Wound Cleanser Discharge Instruction: Cleanse the wound with wound cleanser prior to applying a clean dressing using gauze sponges, not tissue or cotton balls. Peri-Wound Care Topical Gentamicin Discharge Instruction: As directed by physician Mupirocin Ointment Discharge Instruction: Apply Mupirocin (Bactroban) as instructed Primary Dressing Hydrofera Blue Ready Transfer Foam, 2.5x2.5 (in/in) Discharge Instruction: Apply directly to wound bed as directed Secondary Dressing ALLEVYN Gentle Border, 5x5 (in/in) Discharge Instruction: Apply over primary dressing as directed. Secured With [employer], Brian Brian [name] ([id number]) [id number] Page 7 of 7 Compression Stockings Add-Ons Engineer, maintenance, 6 (in) Electronic Signature(s) Signed: 05/08/2023 4:43:16 PM By: Brian Schwalbe RN Entered By: Brian Brian [name] on 05/08/2023 11:15:28 -------------------------------------------------------------------------------- Vitals Details Patient Name: Date of Service: Brian Brian [name], Brian V ID Brian [name] 05/08/2023 1:45 PM Medical Record Number: [id number] Patient Account Number: [id number] Date of Birth/Sex: Treating RN: 1933-05-03 (89 y.o. Brian [name]) Primary Care Brian Brian [name]: Brian Brian [name] Other Clinician: Referring Brian Brian [name]: Treating Brian Brian [name]: Brian Brian [name] in Treatment: 64 Vital Signs Time Taken: 02:02 Temperature (F): 98.7 Height (in): 66 Pulse (bpm): 76 Weight (lbs): 185 Respiratory Rate (breaths/min): 18 Body Mass Index (BMI):  29.9 Blood Pressure (mmHg): 107/71 Reference Range: 80 - 120 mg / dl Electronic Signature(s) Signed: 05/08/2023 3:03:50 PM By: Brian Brian [name] Entered By: Brian Brian [name] on 05/08/2023 11:02:38  [place], Brian Brian [name] ([id number]) [id number] Page 1 of 7 Visit Report for 05/08/2023 Arrival Information Details Patient Name: Date of Service: Brian Brian Brian [name] ID Brian [name] 05/08/2023 1:45 PM Medical Record Number: [id number] Patient Account Number: [id number] Date of Birth/Sex: Treating RN: 04-Dec-1932 (89 y.o. Brian [name]) Primary Care Brian Brian [name]: Brian Brian [name] Other Clinician: Referring Brian Brian [name]: Treating Brian Brian [name]/Extender: Brian Brian [name] in Treatment: 12 Visit Information History Since Last Visit Added or deleted any medications: No Patient Arrived: Wheel Chair Any new allergies or adverse reactions: No Arrival Time: 14:01 Had a fall or experienced change in No Accompanied By: son activities of daily living that may affect Transfer Assistance: None risk of falls: Patient Identification Verified: Yes Signs or symptoms of abuse/neglect since last visito No Secondary Verification Process Completed: Yes Hospitalized since last visit: No Patient Requires Transmission-Based Precautions: No Implantable device outside of the clinic excluding No Patient Has Alerts: Yes cellular tissue based products placed in the center Patient Alerts: Patient on Blood Thinner since last visit: Pain Present Now: No Electronic Signature(s) Signed: 05/08/2023 3:03:50 PM By: Brian Brian [name] Entered By: Brian Brian [name] on 05/08/2023 11:02:14 -------------------------------------------------------------------------------- Encounter Discharge Information Details Patient Name: Date of Service: Brian RDO [name] ID Brian [name] 05/08/2023 1:45 PM Medical Record Number: [id number] Patient Account Number: [id number] Date of Birth/Sex: Treating RN: [birth year] (89 y.o. Brian Brian [name] Primary Care Kymberly Blomberg: Brian Brian [name] Other Clinician: Referring Micai Apolinar: Treating Palmyra Rogacki/Extender: Brian Brian [name] in Treatment: 64 Encounter Discharge Information Items Post Procedure  Vitals Discharge Condition: Stable Temperature (F): 98.7 Ambulatory Status: Wheelchair Pulse (bpm): 76 Discharge Destination: Home Respiratory Rate (breaths/min): 18 Transportation: Private Auto Blood Pressure (mmHg): 107/71 Accompanied By: son Schedule Follow-up Appointment: Yes Clinical Summary of Care: Patient Declined Electronic Signature(s) Signed: 05/08/2023 4:43:16 PM By: Brian Schwalbe RN Entered By: Brian Brian [name] on 05/08/2023 13:37:26 Brian Brian [name], Brian Brian [name] ([id number]) [id number] Page 2 of 7 -------------------------------------------------------------------------------- Lower Extremity Assessment Details Patient Name: Date of Service: Brian Brian Brian [name] ID Brian [name] 05/08/2023 1:45 PM Medical Record Number: [id number] Patient Account Number: [id number] Date of Birth/Sex: Treating RN: 02-17-1933 (89 y.o. Brian Brian [name] Primary Care Jlen Wintle: Brian Brian [name] Other Clinician: Referring Lillybeth Tal: Treating Tywan Siever/Extender: Brian Brian [name] in Treatment: 32 Electronic Signature(s) Signed: 05/08/2023 4:43:16 PM By: Brian Schwalbe RN Entered By: Brian Brian [name] on 05/08/2023 11:14:24 -------------------------------------------------------------------------------- Multi Wound Chart Details Patient Name: Date of Service: Brian Brian Brian [name] ID Brian [name] 05/08/2023 1:45 PM Medical Record Number: [id number] Patient Account Number: [id number] Date of Birth/Sex: Treating RN: 01/11/1933 (89 y.o. Brian [name]) Primary Care Brandon Scarbrough: Brian Brian [name] Other Clinician: Referring Santina Trillo: Treating Parker Sawatzky/Extender: Brian Brian [name] in Treatment: 64 Vital Signs Height(in): 66 Pulse(bpm): 76 Weight(lbs): 185 Blood Pressure(mmHg): 107/71 Body Mass Index(BMI): 29.9 Temperature(F): 98.7 Respiratory Rate(breaths/min): 18 [1:Photos:] [N/A:N/A] Left Elbow N/A N/A Wound Location: Pressure Injury N/A N/A Wounding Event: Pressure Ulcer N/A  N/A Primary Etiology: Cataracts, Arrhythmia, Coronary N/A N/A Comorbid History: Artery Disease, Hypotension, Osteoarthritis 12/19/2021 N/A N/A Date Acquired: 73 N/A N/A Weeks of Treatment: Open N/A N/A Wound Status: No N/A N/A Wound Recurrence: Yes N/A N/A Clustered Wound: 1x0.9x0.1 N/A N/A Measurements L x W x D (cm) 0.707 N/A N/A A (cm) : rea 0.071 N/A N/A Volume (cm) : 92.70% N/A N/A % Reduction in A rea: 92.60% N/A N/A % Reduction in Volume: Category/Stage III N/A N/A Classification: Medium N/A N/A Exudate A mount: Serosanguineous N/A N/A Exudate Type: red, brown N/A N/A Exudate Color: Distinct, outline attached N/A N/A Wound  11:02:43 -------------------------------------------------------------------------------- Patient/Caregiver Education Details Patient Name: Date of Service: Brian Brian Brian [name] ID Brian [name] 9/17/2024andnbsp1:45 PM Medical Record Number: [id number] Patient Account Number: [id number] Date of Birth/Gender: Treating RN: 12/23/1932 (89 y.o. Brian Brian [name] Primary Care Physician: Brian Brian [name] Other Clinician: Referring Physician: Treating Physician/Extender: Brian Brian [name] in Treatment: 50 Education  Assessment Education Provided To: Patient Education Topics Provided Wound/Skin Impairment: Methods: Explain/Verbal Responses: State content correctly Electronic Signature(s) Signed: 05/08/2023 4:43:16 PM By: Brian Schwalbe RN Entered By: Brian Brian [name] on 05/08/2023 13:36:11 -------------------------------------------------------------------------------- Wound Assessment Details Patient Name: Date of Service: Brian Brian Brian [name] ID Brian [name] 05/08/2023 1:45 PM Medical Record Number: [id number] Patient Account Number: [id number] Date of Birth/Sex: Treating RN: 03/11/1933 (89 y.o. Brian [name]) Primary Care Brian Brian [name]: Brian Brian [name] Other Clinician: Referring Brian Brian [name]: Treating Brian Brian [name]/Extender: Brian Brian [name] in Treatment: 64 Wound Status Wound Number: 1 Primary Pressure Ulcer Etiology: Wound Location: Left Elbow Wound Status: Open Wounding Event: Pressure Injury Comorbid Cataracts, Arrhythmia, Coronary Artery Disease, Date Acquired: 12/19/2021 History: Hypotension, Osteoarthritis Weeks Of Treatment: 64 Clustered Wound: Yes Photos [place], Brian Brian [name] ([id number]) [id number] Page 6 of 7 Wound Measurements Length: (cm) 1 Width: (cm) 0.9 Depth: (cm) 0.1 Area: (cm) 0.707 Volume: (cm) 0.071 % Reduction in Area: 92.7% % Reduction in Volume: 92.6% Epithelialization: Medium (34-66%) Tunneling: No Undermining: No Wound Description Classification: Category/Stage III Wound Margin: Distinct, outline attached Exudate Amount: Medium Exudate Type: Serosanguineous Exudate Color: red, brown Foul Odor After Cleansing: No Slough/Fibrino Yes Wound Bed Granulation Amount: Large (67-100%) Exposed Structure Granulation Quality: Red, Hyper-granulation Fascia Exposed: No Necrotic Amount: Small (1-33%) Fat Layer (Subcutaneous Tissue) Exposed: Yes Necrotic Quality: Eschar, Adherent Slough Tendon Exposed: No Muscle Exposed: No Joint Exposed: No Bone Exposed:  No Periwound Skin Texture Texture Color No Abnormalities Noted: Yes No Abnormalities Noted: Yes Moisture Temperature / Pain No Abnormalities Noted: Yes Temperature: No Abnormality Tenderness on Palpation: Yes Treatment Notes Wound #1 (Elbow) Wound Laterality: Left Cleanser Soap and Water Discharge Instruction: May shower and wash wound with dial antibacterial soap and water prior to dressing change. Wound Cleanser Discharge Instruction: Cleanse the wound with wound cleanser prior to applying a clean dressing using gauze sponges, not tissue or cotton balls. Peri-Wound Care Topical Gentamicin Discharge Instruction: As directed by physician Mupirocin Ointment Discharge Instruction: Apply Mupirocin (Bactroban) as instructed Primary Dressing Hydrofera Blue Ready Transfer Foam, 2.5x2.5 (in/in) Discharge Instruction: Apply directly to wound bed as directed Secondary Dressing ALLEVYN Gentle Border, 5x5 (in/in) Discharge Instruction: Apply over primary dressing as directed. Secured With [employer], Brian Brian [name] ([id number]) [id number] Page 7 of 7 Compression Stockings Add-Ons Engineer, maintenance, 6 (in) Electronic Signature(s) Signed: 05/08/2023 4:43:16 PM By: Brian Schwalbe RN Entered By: Brian Brian [name] on 05/08/2023 11:15:28 -------------------------------------------------------------------------------- Vitals Details Patient Name: Date of Service: Brian Brian [name], Brian V ID Brian [name] 05/08/2023 1:45 PM Medical Record Number: [id number] Patient Account Number: [id number] Date of Birth/Sex: Treating RN: 1933-05-03 (89 y.o. Brian [name]) Primary Care Brian Brian [name]: Brian Brian [name] Other Clinician: Referring Brian Brian [name]: Treating Brian Brian [name]: Brian Brian [name] in Treatment: 64 Vital Signs Time Taken: 02:02 Temperature (F): 98.7 Height (in): 66 Pulse (bpm): 76 Weight (lbs): 185 Respiratory Rate (breaths/min): 18 Body Mass Index (BMI):  29.9 Blood Pressure (mmHg): 107/71 Reference Range: 80 - 120 mg / dl Electronic Signature(s) Signed: 05/08/2023 3:03:50 PM By: Brian Brian [name] Entered By: Brian Brian [name] on 05/08/2023 11:02:38

## 2023-05-10 DIAGNOSIS — I4819 Other persistent atrial fibrillation: Secondary | ICD-10-CM | POA: Diagnosis not present

## 2023-05-10 DIAGNOSIS — I119 Hypertensive heart disease without heart failure: Secondary | ICD-10-CM | POA: Diagnosis not present

## 2023-05-10 DIAGNOSIS — L89023 Pressure ulcer of left elbow, stage 3: Secondary | ICD-10-CM | POA: Diagnosis not present

## 2023-05-10 DIAGNOSIS — I2510 Atherosclerotic heart disease of native coronary artery without angina pectoris: Secondary | ICD-10-CM | POA: Diagnosis not present

## 2023-05-10 DIAGNOSIS — I69354 Hemiplegia and hemiparesis following cerebral infarction affecting left non-dominant side: Secondary | ICD-10-CM | POA: Diagnosis not present

## 2023-05-10 DIAGNOSIS — Z7901 Long term (current) use of anticoagulants: Secondary | ICD-10-CM | POA: Diagnosis not present

## 2023-05-15 DIAGNOSIS — I4819 Other persistent atrial fibrillation: Secondary | ICD-10-CM | POA: Diagnosis not present

## 2023-05-15 DIAGNOSIS — I2510 Atherosclerotic heart disease of native coronary artery without angina pectoris: Secondary | ICD-10-CM | POA: Diagnosis not present

## 2023-05-15 DIAGNOSIS — Z7901 Long term (current) use of anticoagulants: Secondary | ICD-10-CM | POA: Diagnosis not present

## 2023-05-15 DIAGNOSIS — I119 Hypertensive heart disease without heart failure: Secondary | ICD-10-CM | POA: Diagnosis not present

## 2023-05-15 DIAGNOSIS — I69354 Hemiplegia and hemiparesis following cerebral infarction affecting left non-dominant side: Secondary | ICD-10-CM | POA: Diagnosis not present

## 2023-05-15 DIAGNOSIS — L89023 Pressure ulcer of left elbow, stage 3: Secondary | ICD-10-CM | POA: Diagnosis not present

## 2023-05-17 DIAGNOSIS — I4819 Other persistent atrial fibrillation: Secondary | ICD-10-CM | POA: Diagnosis not present

## 2023-05-17 DIAGNOSIS — L89023 Pressure ulcer of left elbow, stage 3: Secondary | ICD-10-CM | POA: Diagnosis not present

## 2023-05-17 DIAGNOSIS — I119 Hypertensive heart disease without heart failure: Secondary | ICD-10-CM | POA: Diagnosis not present

## 2023-05-17 DIAGNOSIS — I2510 Atherosclerotic heart disease of native coronary artery without angina pectoris: Secondary | ICD-10-CM | POA: Diagnosis not present

## 2023-05-17 DIAGNOSIS — Z7901 Long term (current) use of anticoagulants: Secondary | ICD-10-CM | POA: Diagnosis not present

## 2023-05-17 DIAGNOSIS — I69354 Hemiplegia and hemiparesis following cerebral infarction affecting left non-dominant side: Secondary | ICD-10-CM | POA: Diagnosis not present

## 2023-05-18 DIAGNOSIS — L89023 Pressure ulcer of left elbow, stage 3: Secondary | ICD-10-CM | POA: Diagnosis not present

## 2023-05-18 DIAGNOSIS — I69354 Hemiplegia and hemiparesis following cerebral infarction affecting left non-dominant side: Secondary | ICD-10-CM | POA: Diagnosis not present

## 2023-05-18 DIAGNOSIS — I4819 Other persistent atrial fibrillation: Secondary | ICD-10-CM | POA: Diagnosis not present

## 2023-05-18 DIAGNOSIS — Z7901 Long term (current) use of anticoagulants: Secondary | ICD-10-CM | POA: Diagnosis not present

## 2023-05-18 DIAGNOSIS — I2510 Atherosclerotic heart disease of native coronary artery without angina pectoris: Secondary | ICD-10-CM | POA: Diagnosis not present

## 2023-05-18 DIAGNOSIS — I119 Hypertensive heart disease without heart failure: Secondary | ICD-10-CM | POA: Diagnosis not present

## 2023-05-22 ENCOUNTER — Telehealth: Payer: Self-pay | Admitting: Family Medicine

## 2023-05-22 ENCOUNTER — Other Ambulatory Visit: Payer: Self-pay | Admitting: Family Medicine

## 2023-05-22 DIAGNOSIS — I69354 Hemiplegia and hemiparesis following cerebral infarction affecting left non-dominant side: Secondary | ICD-10-CM | POA: Diagnosis not present

## 2023-05-22 DIAGNOSIS — I2510 Atherosclerotic heart disease of native coronary artery without angina pectoris: Secondary | ICD-10-CM | POA: Diagnosis not present

## 2023-05-22 DIAGNOSIS — R1312 Dysphagia, oropharyngeal phase: Secondary | ICD-10-CM | POA: Diagnosis not present

## 2023-05-22 DIAGNOSIS — Z7901 Long term (current) use of anticoagulants: Secondary | ICD-10-CM | POA: Diagnosis not present

## 2023-05-22 DIAGNOSIS — I4819 Other persistent atrial fibrillation: Secondary | ICD-10-CM | POA: Diagnosis not present

## 2023-05-22 DIAGNOSIS — Z86718 Personal history of other venous thrombosis and embolism: Secondary | ICD-10-CM | POA: Diagnosis not present

## 2023-05-22 DIAGNOSIS — M179 Osteoarthritis of knee, unspecified: Secondary | ICD-10-CM | POA: Diagnosis not present

## 2023-05-22 DIAGNOSIS — R7881 Bacteremia: Secondary | ICD-10-CM | POA: Diagnosis not present

## 2023-05-22 DIAGNOSIS — L89023 Pressure ulcer of left elbow, stage 3: Secondary | ICD-10-CM | POA: Diagnosis not present

## 2023-05-22 DIAGNOSIS — Z79891 Long term (current) use of opiate analgesic: Secondary | ICD-10-CM | POA: Diagnosis not present

## 2023-05-22 DIAGNOSIS — Z952 Presence of prosthetic heart valve: Secondary | ICD-10-CM | POA: Diagnosis not present

## 2023-05-22 DIAGNOSIS — I119 Hypertensive heart disease without heart failure: Secondary | ICD-10-CM | POA: Diagnosis not present

## 2023-05-22 DIAGNOSIS — R300 Dysuria: Secondary | ICD-10-CM | POA: Diagnosis not present

## 2023-05-22 DIAGNOSIS — K219 Gastro-esophageal reflux disease without esophagitis: Secondary | ICD-10-CM

## 2023-05-22 NOTE — Telephone Encounter (Signed)
Rx sent to the pharmacy  and patients son, Brian [name] notified VIA phone. Dm/cma

## 2023-05-22 NOTE — Telephone Encounter (Signed)
Refill request  pantoprazole (PROTONIX) 40 MG tablet [[id number]] has 3 left    [id number] - [place], [place] - [address] AT [facility] OF [facility] & [address], [place] [address] Phone: [phone]  Fax: [phone]

## 2023-05-24 DIAGNOSIS — L89023 Pressure ulcer of left elbow, stage 3: Secondary | ICD-10-CM | POA: Diagnosis not present

## 2023-05-24 DIAGNOSIS — I2510 Atherosclerotic heart disease of native coronary artery without angina pectoris: Secondary | ICD-10-CM | POA: Diagnosis not present

## 2023-05-24 DIAGNOSIS — I69354 Hemiplegia and hemiparesis following cerebral infarction affecting left non-dominant side: Secondary | ICD-10-CM | POA: Diagnosis not present

## 2023-05-24 DIAGNOSIS — I119 Hypertensive heart disease without heart failure: Secondary | ICD-10-CM | POA: Diagnosis not present

## 2023-05-24 DIAGNOSIS — Z7901 Long term (current) use of anticoagulants: Secondary | ICD-10-CM | POA: Diagnosis not present

## 2023-05-24 DIAGNOSIS — I4819 Other persistent atrial fibrillation: Secondary | ICD-10-CM | POA: Diagnosis not present

## 2023-05-29 ENCOUNTER — Encounter (HOSPITAL_BASED_OUTPATIENT_CLINIC_OR_DEPARTMENT_OTHER): Payer: Medicare Other | Attending: General Surgery | Admitting: General Surgery

## 2023-05-29 DIAGNOSIS — L89023 Pressure ulcer of left elbow, stage 3: Secondary | ICD-10-CM | POA: Diagnosis not present

## 2023-05-29 DIAGNOSIS — I2510 Atherosclerotic heart disease of native coronary artery without angina pectoris: Secondary | ICD-10-CM | POA: Insufficient documentation

## 2023-05-29 DIAGNOSIS — Z86718 Personal history of other venous thrombosis and embolism: Secondary | ICD-10-CM | POA: Diagnosis not present

## 2023-05-29 DIAGNOSIS — I69954 Hemiplegia and hemiparesis following unspecified cerebrovascular disease affecting left non-dominant side: Secondary | ICD-10-CM | POA: Insufficient documentation

## 2023-05-29 DIAGNOSIS — I10 Essential (primary) hypertension: Secondary | ICD-10-CM | POA: Diagnosis not present

## 2023-05-29 DIAGNOSIS — I4819 Other persistent atrial fibrillation: Secondary | ICD-10-CM | POA: Insufficient documentation

## 2023-05-29 NOTE — Progress Notes (Signed)
SYRUS, NAKAMA ([id number]) [id number] Page 1 of 7 Visit Report for 05/29/2023 Arrival Information Details Patient Name: Date of Service: [name] ID M. 05/29/2023 2:15 PM Medical Record Number: [id number] Patient Account Number: [id number] Date of Birth/Sex: Treating RN: [birth year] (89 y.o. [name] Primary Care Trayshawn Durkin: Nadene Rubins Other Clinician: Referring Chancey Cullinane: Treating Olena Willy/Extender: [name] in Treatment: 17 Visit Information History Since Last Visit Added or deleted any medications: No Patient Arrived: Wheel Chair Any new allergies or adverse reactions: No Arrival Time: 13:51 Had a fall or experienced change in No Accompanied By: son activities of daily living that may affect Transfer Assistance: None risk of falls: Patient Identification Verified: Yes Signs or symptoms of abuse/neglect since last visito No Secondary Verification Process Completed: Yes Hospitalized since last visit: No Patient Requires Transmission-Based Precautions: No Implantable device outside of the clinic excluding No Patient Has Alerts: Yes cellular tissue based products placed in the center Patient Alerts: Patient on Blood Thinner since last visit: Has Dressing in Place as Prescribed: Yes Pain Present Now: Yes Electronic Signature(s) Signed: 05/29/2023 3:15:01 PM By: Samuella Bruin Entered By: Samuella Bruin on 05/29/2023 13:52:20 -------------------------------------------------------------------------------- Encounter Discharge Information Details Patient Name: Date of Service: [name] ID M. 05/29/2023 2:15 PM Medical Record Number: [id number] Patient Account Number: [id number] Date of Birth/Sex: Treating RN: 1932-09-22 (89 y.o. [name] Primary Care Austine Kelsay: Nadene Rubins Other Clinician: Referring Eisa Conaway: Treating Alexandera Kuntzman/Extender: [name]  in Treatment: 57 Encounter Discharge Information Items Post Procedure Vitals Discharge Condition: Stable Temperature (F): 98 Ambulatory Status: Wheelchair Pulse (bpm): 89 Discharge Destination: Home Respiratory Rate (breaths/min): 16 Transportation: Private Auto Blood Pressure (mmHg): 115/57 Accompanied By: son Schedule Follow-up Appointment: Yes Clinical Summary of Care: Patient Declined Electronic Signature(s) Signed: 05/29/2023 3:15:01 PM By: Samuella Bruin Entered By: Samuella Bruin on 05/29/2023 14:15:00 Rief, Rondrick M ([id number]) [id number] Page 2 of 7 -------------------------------------------------------------------------------- Lower Extremity Assessment Details Patient Name: Date of Service: [name] ID M. 05/29/2023 2:15 PM Medical Record Number: [id number] Patient Account Number: [id number] Date of Birth/Sex: Treating RN: 01/17/1933 (89 y.o. [name] Primary Care Supriya Beaston: Nadene Rubins Other Clinician: Referring Jazline Cumbee: Treating Cagney Degrace/Extender: [name] in Treatment: 28 Electronic Signature(s) Signed: 05/29/2023 3:15:01 PM By: Gelene Mink By: Samuella Bruin on 05/29/2023 13:53:11 -------------------------------------------------------------------------------- Multi Wound Chart Details Patient Name: Date of Service: [name] ID M. 05/29/2023 2:15 PM Medical Record Number: [id number] Patient Account Number: [id number] Date of Birth/Sex: Treating RN: [birth year] (89 y.o. M) Primary Care Braedyn Kauk: Nadene Rubins Other Clinician: Referring Kathleen Likins: Treating Cortez Flippen/Extender: [name] in Treatment: 67 Vital Signs Height(in): 66 Pulse(bpm): 89 Weight(lbs): 185 Blood Pressure(mmHg): 115/57 Body Mass Index(BMI): 29.9 Temperature(F): 98 Respiratory Rate(breaths/min): 16 [1:Photos:] [N/A:N/A] Left Elbow N/A N/A Wound  Location: Pressure Injury N/A N/A Wounding Event: Pressure Ulcer N/A N/A Primary Etiology: Cataracts, Arrhythmia, Coronary N/A N/A Comorbid History: Artery Disease, Hypotension, Osteoarthritis 12/19/2021 N/A N/A Date Acquired: 2 N/A N/A Weeks of Treatment: Open N/A N/A Wound Status: No N/A N/A Wound Recurrence: Yes N/A N/A Clustered Wound: 0.8x0.9x0.2 N/A N/A Measurements L x W x D (cm) 0.565 N/A N/A A (cm) : rea 0.113 N/A N/A Volume (cm) : 94.10% N/A N/A % Reduction in A rea: 88.30% N/A N/A % Reduction in Volume: Category/Stage III N/A N/A Classification: Medium N/A N/A Exudate A mount: Serosanguineous N/A N/A Exudate Type: red, brown N/A N/A Exudate  Color: Distinct, outline attached N/A N/A Wound Margin: Large (67-100%) N/A N/A Granulation A mount: [name] ([id number]) [id number] Page 3 of 7 Pink, Pale N/A N/A Granulation Quality: Small (1-33%) N/A N/A Necrotic Amount: Fat Layer (Subcutaneous Tissue): Yes N/A N/A Exposed Structures: Fascia: No Tendon: No Muscle: No Joint: No Bone: No Medium (34-66%) N/A N/A Epithelialization: Debridement - Selective/Open Wound N/A N/A Debridement: Pre-procedure Verification/Time Out 14:02 N/A N/A Taken: Lidocaine 4% Topical Solution N/A N/A Pain Control: Slough N/A N/A Tissue Debrided: Non-Viable Tissue N/A N/A Level: 0.57 N/A N/A Debridement A (sq cm): rea Curette N/A N/A Instrument: Minimum N/A N/A Bleeding: Pressure N/A N/A Hemostasis A chieved: Procedure was tolerated well N/A N/A Debridement Treatment Response: 0.8x0.9x0.2 N/A N/A Post Debridement Measurements L x W x D (cm) 0.113 N/A N/A Post Debridement Volume: (cm) Category/Stage III N/A N/A Post Debridement Stage: No Abnormalities Noted N/A N/A Periwound Skin Texture: No Abnormalities Noted N/A N/A Periwound Skin Moisture: No Abnormalities Noted N/A N/A Periwound Skin Color: No Abnormality N/A  N/A Temperature: Yes N/A N/A Tenderness on Palpation: Debridement N/A N/A Procedures Performed: Treatment Notes Electronic Signature(s) Signed: 05/29/2023 2:09:59 PM By: Duanne Guess MD FACS Entered By: Duanne Guess on 05/29/2023 14:09:59 -------------------------------------------------------------------------------- Multi-Disciplinary Care Plan Details Patient Name: Date of Service: [name] ID M. 05/29/2023 2:15 PM Medical Record Number: [id number] Patient Account Number: [id number] Date of Birth/Sex: Treating RN: 1933-04-15 (89 y.o. [name] Primary Care Jaimy Kliethermes: Nadene Rubins Other Clinician: Referring Lorianna Spadaccini: Treating Carley Glendenning/Extender: [name] in Treatment: 28 Multidisciplinary Care Plan reviewed with physician Active Inactive Abuse / Safety / Falls / Self Care Management Nursing Diagnoses: Impaired physical mobility Potential for falls Goals: Patient/caregiver will verbalize understanding of skin care regimen Date Initiated: 02/14/2022 Date Inactivated: 04/17/2023 Target Resolution Date: 04/21/2023 Goal Status: Met Patient/caregiver will verbalize/demonstrate measures taken to prevent injury and/or falls Date Initiated: 02/14/2022 Target Resolution Date: 07/18/2023 Goal Status: Active Interventions: Assess fall risk on admission and as needed Assess self care needs on admission and as needed Notes: TYAIRE, ODEM ([id number]) [phone].pdf Page 4 of 7 Wound/Skin Impairment Nursing Diagnoses: Impaired tissue integrity Knowledge deficit related to ulceration/compromised skin integrity Goals: Patient/caregiver will verbalize understanding of skin care regimen Date Initiated: 02/14/2022 Target Resolution Date: 07/18/2023 Goal Status: Active Ulcer/skin breakdown will have a volume reduction of 30% by week 4 Date Initiated: 02/14/2022 Date Inactivated: 05/23/2022 Target Resolution  Date: 05/05/2022 Goal Status: Unmet Unmet Reason: new PU left glut Interventions: Assess patient/caregiver ability to obtain necessary supplies Assess patient/caregiver ability to perform ulcer/skin care regimen upon admission and as needed Assess ulceration(s) every visit Treatment Activities: Skin care regimen initiated : 02/14/2022 Topical wound management initiated : 02/14/2022 Notes: Electronic Signature(s) Signed: 05/29/2023 3:15:01 PM By: Samuella Bruin Entered By: Samuella Bruin on 05/29/2023 14:04:29 -------------------------------------------------------------------------------- Pain Assessment Details Patient Name: Date of Service: [name] ID M. 05/29/2023 2:15 PM Medical Record Number: [id number] Patient Account Number: [id number] Date of Birth/Sex: Treating RN: Mar 06, 1933 (89 y.o. [name] Primary Care Geovanny Sartin: Nadene Rubins Other Clinician: Referring Reily Ilic: Treating Joniece Smotherman/Extender: [name] in Treatment: 21 Active Problems Location of Pain Severity and Description of Pain Patient Has Paino Yes Site Locations Pain Location: Pain in Ulcers Duration of the Pain. Constant / Intermittento Constant Rate the pain. Current Pain Level: 5 Character of Pain Describe the Pain: Burning Pain Management and Medication Current Pain Management: ARJEN, DERINGER ([id number]) [phone].pdf Page 5 of 7  Electronic Signature(s) Signed: 05/29/2023 3:15:01 PM By: Gelene Mink By: Samuella Bruin on 05/29/2023 13:53:06 -------------------------------------------------------------------------------- Patient/Caregiver Education Details Patient Name: Date of Service: [name] ID M. 10/8/2024andnbsp2:15 PM Medical Record Number: [id number] Patient Account Number: [id number] Date of Birth/Gender: Treating RN: 1933/03/02 (89 y.o. [name] Primary Care Physician: Nadene Rubins Other Clinician: Referring Physician: Treating Physician/Extender: [name] in Treatment: 59 Education Assessment Education Provided To: Patient Education Topics Provided Wound/Skin Impairment: Methods: Explain/Verbal Responses: Reinforcements needed, State content correctly Electronic Signature(s) Signed: 05/29/2023 3:15:01 PM By: Samuella Bruin Entered By: Samuella Bruin on 05/29/2023 14:04:40 -------------------------------------------------------------------------------- Wound Assessment Details Patient Name: Date of Service: [name] ID M. 05/29/2023 2:15 PM Medical Record Number: [id number] Patient Account Number: [id number] Date of Birth/Sex: Treating RN: [birth year] (89 y.o. [name] Primary Care Sailor Haughn: Nadene Rubins Other Clinician: Referring Emilija Bohman: Treating Crosley Stejskal/Extender: [name] in Treatment: 67 Wound Status Wound Number: 1 Primary Pressure Ulcer Etiology: Wound Location: Left Elbow Wound Status: Open Wounding Event: Pressure Injury Comorbid Cataracts, Arrhythmia, Coronary Artery Disease, Date Acquired: 12/19/2021 History: Hypotension, Osteoarthritis Weeks Of Treatment: 67 Clustered Wound: Yes Photos Portis, Teena Irani ([id number]) [id number] Page 6 of 7 Wound Measurements Length: (cm) 0.8 Width: (cm) 0.9 Depth: (cm) 0.2 Area: (cm) 0.565 Volume: (cm) 0.113 % Reduction in Area: 94.1% % Reduction in Volume: 88.3% Epithelialization: Medium (34-66%) Tunneling: No Undermining: No Wound Description Classification: Category/Stage III Wound Margin: Distinct, outline attached Exudate Amount: Medium Exudate Type: Serosanguineous Exudate Color: red, brown Foul Odor After Cleansing: No Slough/Fibrino Yes Wound Bed Granulation Amount: Large (67-100%) Exposed Structure Granulation Quality: Pink, Pale Fascia Exposed: No Necrotic Amount:  Small (1-33%) Fat Layer (Subcutaneous Tissue) Exposed: Yes Necrotic Quality: Adherent Slough Tendon Exposed: No Muscle Exposed: No Joint Exposed: No Bone Exposed: No Periwound Skin Texture Texture Color No Abnormalities Noted: Yes No Abnormalities Noted: Yes Moisture Temperature / Pain No Abnormalities Noted: Yes Temperature: No Abnormality Tenderness on Palpation: Yes Treatment Notes Wound #1 (Elbow) Wound Laterality: Left Cleanser Soap and Water Discharge Instruction: May shower and wash wound with dial antibacterial soap and water prior to dressing change. Wound Cleanser Discharge Instruction: Cleanse the wound with wound cleanser prior to applying a clean dressing using gauze sponges, not tissue or cotton balls. Peri-Wound Care Topical Gentamicin Discharge Instruction: As directed by physician Mupirocin Ointment Discharge Instruction: Apply Mupirocin (Bactroban) as instructed Primary Dressing Hydrofera Blue Ready Transfer Foam, 2.5x2.5 (in/in) Discharge Instruction: Apply directly to wound bed as directed Secondary Dressing ALLEVYN Gentle Border, 5x5 (in/in) Discharge Instruction: Apply over primary dressing as directed. Secured With [employer], Felicia M ([id number]) [id number] Page 7 of 7 Compression Stockings Add-Ons Engineer, maintenance, 6 (in) Electronic Signature(s) Signed: 05/29/2023 3:15:01 PM By: Samuella Bruin Entered By: Samuella Bruin on 05/29/2023 14:00:34 -------------------------------------------------------------------------------- Vitals Details Patient Name: Date of Service: [name] ID M. 05/29/2023 2:15 PM Medical Record Number: [id number] Patient Account Number: [id number] Date of Birth/Sex: Treating RN: [birth year] (89 y.o. [name] Primary Care Naitik Hermann: Nadene Rubins Other Clinician: Referring Debara Kamphuis: Treating Bowen Goyal/Extender: [name] in  Treatment: 67 Vital Signs Time Taken: 13:53 Temperature (F): 98 Height (in): 66 Pulse (bpm): 89 Weight (lbs): 185 Respiratory Rate (breaths/min): 16 Body Mass Index (BMI): 29.9 Blood Pressure (mmHg): 115/57 Reference Range: 80 - 120 mg / dl Electronic Signature(s) Signed: 05/29/2023 3:15:01 PM By: Samuella Bruin Entered By: Samuella Bruin on 05/29/2023 13:54:43

## 2023-05-29 NOTE — Progress Notes (Signed)
Brian Brian [name], Brian Brian [name] ([id number]) [id number] Page 1 of 10 Visit Report for 05/29/2023 Chief Complaint Document Details Patient Name: Date of Service: Brian Brian [name] ID Brian [name] 05/29/2023 2:15 PM Medical Record Number: [id number] Patient Account Number: [id number] Date of Birth/Sex: Treating RN: 09-11-1932 (89 y.o. Brian [name]) Primary Care Provider: Nadene Rubins Other Clinician: Referring Provider: Treating Provider/Extender: [name] in Treatment: 01 Information Obtained from: Patient Chief Complaint 08/05/2019; patient is here for review of pressure ulcers on his buttock 02/14/22: patient is here for review of pressure ulcers on his left elbow Electronic Signature(s) Signed: 05/29/2023 2:10:09 PM By: Duanne Guess MD FACS Entered By: Duanne Guess on 05/29/2023 14:10:08 -------------------------------------------------------------------------------- Debridement Details Patient Name: Date of Service: Brian Brian [name] ID Brian [name] 05/29/2023 2:15 PM Medical Record Number: [id number] Patient Account Number: [id number] Date of Birth/Sex: Treating RN: March 07, 1933 (89 y.o. Brian Brian [name] Primary Care Provider: Nadene Rubins Other Clinician: Referring Provider: Treating Provider/Extender: [name] in Treatment: 67 Debridement Performed for Assessment: Wound #1 Left Elbow Performed By: Physician Duanne Guess, MD The following information was scribed by: Samuella Bruin The information was scribed for: Duanne Guess Debridement Type: Debridement Level of Consciousness (Pre-procedure): Awake and Alert Pre-procedure Verification/Time Out Yes - 14:02 Taken: Start Time: 14:02 Pain Control: Lidocaine 4% T opical Solution Percent of Wound Bed Debrided: 100% T Area Debrided (cm): otal 0.57 Tissue and other material debrided: Non-Viable, Slough, Slough Level: Non-Viable Tissue Debridement Description:  Selective/Open Wound Instrument: Curette Bleeding: Minimum Hemostasis Achieved: Pressure Response to Treatment: Procedure was tolerated well Level of Consciousness (Post- Awake and Alert procedure): Post Debridement Measurements of Total Wound Length: (cm) 0.8 Stage: Category/Stage III Width: (cm) 0.9 Depth: (cm) 0.2 Volume: (cm) 0.113 Character of Wound/Ulcer Post Debridement: Improved Brian Brian [name] ([id number]) [id number] Page 2 of 10 Post Procedure Diagnosis Same as Pre-procedure Electronic Signature(s) Signed: 05/29/2023 2:27:24 PM By: Duanne Guess MD FACS Signed: 05/29/2023 3:15:01 PM By: Gelene Mink By: Samuella Bruin on 05/29/2023 14:04:55 -------------------------------------------------------------------------------- HPI Details Patient Name: Date of Service: Brian Brian [name] ID Brian [name] 05/29/2023 2:15 PM Medical Record Number: [id number] Patient Account Number: [id number] Date of Birth/Sex: Treating RN: 1933/05/17 (89 y.o. Brian [name]) Primary Care Provider: Nadene Rubins Other Clinician: Referring Provider: Treating Provider/Extender: [name] in Treatment: 53 History of Present Illness HPI Description: ADMISSION 08/05/19 This is an 89 year old man who arrives in clinic accompanied by his son. 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 Apparently noted recently to have a stage I pressure area on the left buttock. He does not have a wound history. He does have been air fluidized mattress. They have a wheelchair cushion as well as a pillow over the top of this. He is not incontinent of urine or bowel. Past medical history includes hypertension, osteoarthritis of the knee, atrial fibrillation, history of mitral valve repair, right hemisphere CVA with left hemiparesis, history of a DVT READMISSION 02/14/2022 The patient is here today for evaluation of pressure ulcers  on his left elbow and possible right buttock ulcer. Apparently when he was seen by Dr. Leanord Hawking in 2020, no ulcer was appreciated on the buttock and no further wound care involvement occurred. 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 He does have home health aides and they are turning him every  2 hours side to side. He is on a memory foam mattress at this time. 03/14/2022: The patient was scheduled to see me on July 11, but apparently his son panicked about some bright red blood per rectum and rushed him to the emergency room. There is also a comment in the electronic medical record that the patient's son thought the bone was exposed on his elbow. After waiting a prolonged period of time at the emergency department, they left without being seen. Today, it appears that the dressing on the patient's elbow was never changed since our last visit. The 2 wounds have converged creating a larger wound. According to the intake nurse, there was a foul odor from the wound and dressing. There is extensive slough on the wound surface. The bleeding per rectum turns out to be hemorrhoids. The patient's son expresses that he does not know how to manage this. 04/07/2022: For unclear reasons, the patient has not returned to clinic until they were contacted today and they made an add-on visit. The patient's son was not originally present at the beginning of the visit but showed up about 25 minutes into the visit. The patient was accompanied by a home aide. She reported that when she cleaned the patient up today, there was just a Band-Aid on his wound. She said the surface was fairly mucky but she was able to wash this off. It is abundantly clear that this wound is not being properly cared for. Apparently he did have [facility] home health but they have not been out in some time and it is not clear the  reason for this. The surface of the wound is a bit cleaner so perhaps the Santyl has been applied, but unfortunately there is now a deep tunnel that extends up from the elbow to the patient's posterior upper arm for a number centimeters. There is no evidence of infection or purulent drainage. It is also clear that the patient continues to spend a substantial portion of his time leaning on the elbow. 05/23/2022: Once again, the patient has failed to return to clinic for about 6 weeks and the reason remains unclear. He is apparently receiving home health assistance. The wound on his elbow is covered with hypertrophic granulation tissue. He also has a blister just distal to the wound on his posterior forearm. He has a new stage III pressure ulcer on his left gluteus with slough accumulation. He apparently spends a substantial portion of his days sitting in a wheelchair. I do not believe he has a Museum/gallery conservator. Due to his stroke, he lists to the left side, which is how his ulcer on his elbow developed. I suspect the gluteal ulcer is as a result of this, as well. 06/13/2022: The wound on his left gluteus is nearly healed. It is very superficial and quite clean. His son reports that he has just been applying Desitin to the area. The elbow wound is smaller but still has some hypertrophic granulation tissue and slough accumulation. No concern for infection at either site. 06/27/2022: The buttock wounds are healed. There is some tissue maceration without any significant skin breakdown. The elbow wound continues to contract. There is still some hypertrophic granulation tissue and slough buildup. 07/25/2022: The buttock wounds have reopened. The periwound skin has been kept very dry through liberal use of zinc oxide, however. The elbow wound dressing did not look like it had been changed in at least a week when it was removed. The underlying wound, however has not  reaccumulated any hypertrophic granulation tissue;  there is a thin layer of slough present. No malodor or purulent drainage from the wound itself. 08/15/2022: There is a tiny opening remaining on the buttocks. It is clean and there is no evidence of tissue maceration. The elbow looks very good. There is good beefy tissue present and it is flush with the surrounding skin. Home health has been coming out. 09/05/2022: The wound on his bottom is healed. The elbow shows signs of pressure induced deep tissue injury and also has some hypertrophic granulation tissue. 09/26/2022: The elbow looks much better today. There is a little bit of slough on the surface but there is no undermining or tunneling. He does have some Brian Brian [name], Brian Brian [name] ([id number]) [id number] Page 3 of 10 hypertrophic granulation tissue accumulation. 10/17/2022: His elbow wound continues to improve. It is smaller, cleaner, and more superficial. There is hypertrophic granulation tissue present. 11/14/2022: His elbow ulcer is smaller again today. There is some slough on the surface. 12/19/2022: The pressure ulcer on his elbow is smaller again today. There is an area that looks like he may have bumped it on something, as it is a little bit bruised and purpleish. He is also requesting an order for physical therapy. 01/16/2023: The pressure ulcer on his elbow looks better than I have ever seen it. It is smaller and more superficial with very little slough on the surface. 02/13/2023: The wound is just the tiniest bit smaller today. There is a little slough on the surface. The tissue around the wound does look a bit purpleish, as though he has been allowed to lean on it, unfortunately. 03/20/2023: The wound measured smaller today. There is less evidence of pressure on the site compared to last month. There is some slough on the surface and the underlying granulation tissue is a bit hypertrophic. 04/17/2023: The wound was smaller again today. There is some tunneling at the more  proximal aspect of the wound. There is hypertrophic granulation tissue present. He has been complaining of more pain and the periwound looks a little bit red. 05/08/2023: The wound continues to contract. No tunneling is present at this point. The hypertrophic granulation tissue has reaccumulated, but not to the extent that was present at his last visit. The erythema of the periwound has resolved is less tender. 05/29/2023: The wound is smaller by perhaps half a centimeter this visit. The hypertrophic granulation tissue has not reaccumulated. There is some slough on the wound surface. Electronic Signature(s) Signed: 05/29/2023 2:10:58 PM By: Duanne Guess MD FACS Entered By: Duanne Guess on 05/29/2023 14:10:58 -------------------------------------------------------------------------------- Physical Exam Details Patient Name: Date of Service: Brian Brian Brian [name] ID Brian [name] 05/29/2023 2:15 PM Medical Record Number: [id number] Patient Account Number: [id number] Date of Birth/Sex: Treating RN: 12-12-1932 (89 y.o. Brian [name]) Primary Care Provider: Nadene Rubins Other Clinician: Referring Provider: Treating Provider/Extender: [name] in Treatment: 72 Constitutional . . . . no acute distress. Respiratory Normal work of breathing on room air. Notes 05/29/2023: The wound is smaller by perhaps half a centimeter this visit. The hypertrophic granulation tissue has not reaccumulated. There is some slough on the wound surface. Electronic Signature(s) Signed: 05/29/2023 2:11:33 PM By: Duanne Guess MD FACS Entered By: Duanne Guess on 05/29/2023 14:11:33 -------------------------------------------------------------------------------- Physician Orders Details Patient Name: Date of Service: Brian Brian Brian [name] ID Brian [name] 05/29/2023 2:15 PM Medical Record Number: [id number] Patient Account Number: [id number] Date of Birth/Sex: Treating RN: 08-Apr-1933 (89 y.o. Brian Brian [name] Primary  Care  Provider: Nadene Rubins Other Clinician: Referring Provider: Treating Provider/Extender: [name] in Treatment: 67 The following information was scribed by: Arlie Solomons, Derek Brian [name] ([id number]) [id number] Page 4 of 10 The information was scribed for: Duanne Guess Verbal / Phone Orders: No Diagnosis Coding ICD-10 Coding Code Description L89.023 Pressure ulcer of left elbow, stage 3 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 I48.19 Other persistent atrial fibrillation I25.10 Atherosclerotic heart disease of native coronary artery without angina pectoris Follow-up Appointments Return appointment in 1 month. - Dr. Lady Gary - room 2 Anesthetic (In clinic) Topical Lidocaine 4% applied to wound bed Bathing/ Shower/ Hygiene May shower and wash wound with soap and water. - with dressing changes Off-Loading Turn and reposition every 2 hours - stay off of left elbow wear prevalon boot on elbow as directed Other: - limit time in wheelchair, reposition in wheelchair every 1-2 hours Home Health No change in wound care orders this week; continue Home Health for wound care. May utilize formulary equivalent dressing for wound treatment orders unless otherwise specified. - physical therapy once a week, wound care twice a week Other Home Health Orders/Instructions: - [id number] Wound Treatment Wound #1 - Elbow Wound Laterality: Left Cleanser: Soap and Water Every Other Day/30 Days Discharge Instructions: May shower and wash wound with dial antibacterial soap and water prior to dressing change. Cleanser: Wound Cleanser (Generic) Every Other Day/30 Days Discharge Instructions: Cleanse the wound with wound cleanser prior to applying a clean dressing using gauze sponges, not tissue or cotton balls. Topical: Gentamicin Every Other Day/30 Days Discharge Instructions: As directed by  physician Topical: Mupirocin Ointment Every Other Day/30 Days Discharge Instructions: Apply Mupirocin (Bactroban) as instructed Prim Dressing: Hydrofera Blue Ready Transfer Foam, 2.5x2.5 (in/in) Every Other Day/30 Days ary Discharge Instructions: Apply directly to wound bed as directed Secondary Dressing: ALLEVYN Gentle Border, 5x5 (in/in) (Generic) Every Other Day/30 Days Discharge Instructions: Apply over primary dressing as directed. Add-Ons: Cotton Tip Applicator, 6 (in) (Generic) Every Other Day/30 Days Patient Medications llergies: penicillin, Novocain A Notifications Medication Indication Start End 05/29/2023 lidocaine DOSE topical 4 % cream - cream topical Electronic Signature(s) Signed: 05/29/2023 2:27:24 PM By: Duanne Guess MD FACS Entered By: Duanne Guess on 05/29/2023 14:11:53 Brian Brian [name], Brian Brian [name] ([id number]) [id number] Page 5 of 10 -------------------------------------------------------------------------------- Problem List Details Patient Name: Date of Service: Brian Brian [name] ID Brian [name] 05/29/2023 2:15 PM Medical Record Number: [id number] Patient Account Number: [id number] Date of Birth/Sex: Treating RN: 13-Sep-1932 (89 y.o. Brian [name]) Primary Care Provider: Nadene Rubins Other Clinician: Referring Provider: Treating Provider/Extender: [name] in Treatment: 89 Active Problems ICD-10 Encounter Code Description Active Date MDM Diagnosis L89.023 Pressure ulcer of left elbow, stage 3 02/14/2022 No Yes 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 02/14/2022 No Yes I48.19 Other persistent atrial fibrillation 02/14/2022 No Yes I25.10 Atherosclerotic heart disease of native coronary artery without angina pectoris 02/14/2022 No Yes Inactive Problems ICD-10 Code Description Active Date Inactive Date L89.323 Pressure ulcer of left buttock, stage 3 05/23/2022 05/23/2022 Resolved  Problems Electronic Signature(s) Signed: 05/29/2023 2:08:44 PM By: Duanne Guess MD FACS Entered By: Duanne Guess on 05/29/2023 14:08:44 -------------------------------------------------------------------------------- Progress Note Details Patient Name: Date of Service: Brian Brian Brian [name] ID Brian [name] 05/29/2023 2:15 PM Medical Record Number: [id number] Patient Account Number: [id number] Date of Birth/Sex: Treating RN: January 22, 1933 (89 y.o. Brian [name]) Primary Care Provider: Nadene Rubins Other Clinician: Referring Provider: Treating Provider/Extender: [name] in Treatment:  67 Subjective Chief Complaint Information obtained from Patient 08/05/2019; patient is here for review of pressure ulcers on his buttock 02/14/22: patient is here for review of pressure ulcers on his left elbow History of Present Illness (HPI) ADMISSION 08/05/19 This is an 89 year old man who arrives in clinic accompanied by his son. 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 Apparently noted recently to have a stage I pressure area on the left buttock. He does not have a wound history. He does have been air fluidized mattress. EMIR, NACK ([id number]) [id number] Page 6 of 10 They have a wheelchair cushion as well as a pillow over the top of this. He is not incontinent of urine or bowel. Past medical history includes hypertension, osteoarthritis of the knee, atrial fibrillation, history of mitral valve repair, right hemisphere CVA with left hemiparesis, history of a DVT READMISSION 02/14/2022 The patient is here today for evaluation of pressure ulcers on his left elbow and possible right buttock ulcer. Apparently when he was seen by Dr. Leanord Hawking in 2020, no ulcer was appreciated on the buttock and no further wound care involvement occurred. 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 He does have home health aides and they are turning him every 2 hours side to side. He is on a memory foam mattress at this time. 03/14/2022: The patient was scheduled to see me on July 11, but apparently his son panicked about some bright red blood per rectum and rushed him to the emergency room. There is also a comment in the electronic medical record that the patient's son thought the bone was exposed on his elbow. After waiting a prolonged period of time at the emergency department, they left without being seen. Today, it appears that the dressing on the patient's elbow was never changed since our last visit. The 2 wounds have converged creating a larger wound. According to the intake nurse, there was a foul odor from the wound and dressing. There is extensive slough on the wound surface. The bleeding per rectum turns out to be hemorrhoids. The patient's son expresses that he does not know how to manage this. 04/07/2022: For unclear reasons, the patient has not returned to clinic until they were contacted today and they made an add-on visit. The patient's son was not originally present at the beginning of the visit but showed up about 25 minutes into the visit. The patient was accompanied by a home aide. She reported that when she cleaned the patient up today, there was just a Band-Aid on his wound. She said the surface was fairly mucky but she was able to wash this off. It is abundantly clear that this wound is not being properly cared for. Apparently he did have [facility] home health but they have not been out in some time and it is not clear the reason for this. The surface of the wound is a bit cleaner so perhaps the Santyl has been applied, but unfortunately there is now a deep tunnel that extends up from the elbow to the patient's posterior upper arm for a number centimeters. There is no evidence of infection or purulent  drainage. It is also clear that the patient continues to spend a substantial portion of his time leaning on the elbow. 05/23/2022: Once again, the patient has failed to return to clinic for  about 6 weeks and the reason remains unclear. He is apparently receiving home health assistance. The wound on his elbow is covered with hypertrophic granulation tissue. He also has a blister just distal to the wound on his posterior forearm. He has a new stage III pressure ulcer on his left gluteus with slough accumulation. He apparently spends a substantial portion of his days sitting in a wheelchair. I do not believe he has a Museum/gallery conservator. Due to his stroke, he lists to the left side, which is how his ulcer on his elbow developed. I suspect the gluteal ulcer is as a result of this, as well. 06/13/2022: The wound on his left gluteus is nearly healed. It is very superficial and quite clean. His son reports that he has just been applying Desitin to the area. The elbow wound is smaller but still has some hypertrophic granulation tissue and slough accumulation. No concern for infection at either site. 06/27/2022: The buttock wounds are healed. There is some tissue maceration without any significant skin breakdown. The elbow wound continues to contract. There is still some hypertrophic granulation tissue and slough buildup. 07/25/2022: The buttock wounds have reopened. The periwound skin has been kept very dry through liberal use of zinc oxide, however. The elbow wound dressing did not look like it had been changed in at least a week when it was removed. The underlying wound, however has not reaccumulated any hypertrophic granulation tissue; there is a thin layer of slough present. No malodor or purulent drainage from the wound itself. 08/15/2022: There is a tiny opening remaining on the buttocks. It is clean and there is no evidence of tissue maceration. The elbow looks very good. There is good beefy tissue present and  it is flush with the surrounding skin. Home health has been coming out. 09/05/2022: The wound on his bottom is healed. The elbow shows signs of pressure induced deep tissue injury and also has some hypertrophic granulation tissue. 09/26/2022: The elbow looks much better today. There is a little bit of slough on the surface but there is no undermining or tunneling. He does have some hypertrophic granulation tissue accumulation. 10/17/2022: His elbow wound continues to improve. It is smaller, cleaner, and more superficial. There is hypertrophic granulation tissue present. 11/14/2022: His elbow ulcer is smaller again today. There is some slough on the surface. 12/19/2022: The pressure ulcer on his elbow is smaller again today. There is an area that looks like he may have bumped it on something, as it is a little bit bruised and purpleish. He is also requesting an order for physical therapy. 01/16/2023: The pressure ulcer on his elbow looks better than I have ever seen it. It is smaller and more superficial with very little slough on the surface. 02/13/2023: The wound is just the tiniest bit smaller today. There is a little slough on the surface. The tissue around the wound does look a bit purpleish, as though he has been allowed to lean on it, unfortunately. 03/20/2023: The wound measured smaller today. There is less evidence of pressure on the site compared to last month. There is some slough on the surface and the underlying granulation tissue is a bit hypertrophic. 04/17/2023: The wound was smaller again today. There is some tunneling at the more proximal aspect of the wound. There is hypertrophic granulation tissue present. He has been complaining of more pain and the periwound looks a little bit red. 05/08/2023: The wound continues to contract. No tunneling is present at this point.  The hypertrophic granulation tissue has reaccumulated, but not to the extent that was present at his last visit. The erythema  of the periwound has resolved is less tender. 05/29/2023: The wound is smaller by perhaps half a centimeter this visit. The hypertrophic granulation tissue has not reaccumulated. There is some slough on the wound surface. Patient History Information obtained from Patient. Family History Diabetes - Maternal Grandparents, Heart Disease - Mother, Hypertension - Mother, No family history of Cancer, Hereditary Spherocytosis, Kidney Disease, Lung Disease, Seizures, Stroke, Thyroid Problems, Tuberculosis. Social History Never smoker, Marital Status - Married, Alcohol Use - Never, Drug Use - No History, Caffeine Use - Never. Medical History Eyes Patient has history of Cataracts - removed Cardiovascular Patient has history of Arrhythmia - Atrial Flutter/Atrial Fib, Coronary Artery Disease, Hypotension Brian Brian [name], Brian Brian [name] ([id number]) [phone].pdf Page 7 of 10 Denies history of Hypertension Musculoskeletal Patient has history of Osteoarthritis Neurologic Denies history of Paraplegia Hospitalization/Surgery History - open heart surgery. - IR generic histoircal. - appendectomy. - hernia repair. - joint replacement. - mitral valve repair. - mvp repair. - total hip arthroplasty. Medical A Surgical History Notes nd Constitutional Symptoms (General Health) obseity Ear/Nose/Mouth/Throat wears hearing aids Cardiovascular Mitral Valve Repair, Genitourinary BPH Neurologic CVA 2018, left side hemiparesis Objective Constitutional no acute distress. Vitals Time Taken: 1:53 PM, Height: 66 in, Weight: 185 lbs, BMI: 29.9, Temperature: 98 F, Pulse: 89 bpm, Respiratory Rate: 16 breaths/min, Blood Pressure: 115/57 mmHg. Respiratory Normal work of breathing on room air. General Notes: 05/29/2023: The wound is smaller by perhaps half a centimeter this visit. The hypertrophic granulation tissue has not reaccumulated. There is some slough on the wound surface. Integumentary (Hair,  Skin) Wound #1 status is Open. Original cause of wound was Pressure Injury. The date acquired was: 12/19/2021. The wound has been in treatment 67 weeks. The wound is located on the Left Elbow. The wound measures 0.8cm length x 0.9cm width x 0.2cm depth; 0.565cm^2 area and 0.113cm^3 volume. There is Fat Layer (Subcutaneous Tissue) exposed. There is no tunneling or undermining noted. There is a medium amount of serosanguineous drainage noted. The wound margin is distinct with the outline attached to the wound base. There is large (67-100%) pink, pale granulation within the wound bed. There is a small (1-33%) amount of necrotic tissue within the wound bed including Adherent Slough. The periwound skin appearance had no abnormalities noted for texture. The periwound skin appearance had no abnormalities noted for moisture. The periwound skin appearance had no abnormalities noted for color. Periwound temperature was noted as No Abnormality. The periwound has tenderness on palpation. 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 There was a Selective/Open Wound Non-Viable Tissue Debridement with a total area of 0.57 sq cm performed by Duanne Guess, MD. With the following instrument(s): Curette to remove Non-Viable tissue/material. Material removed includes [facility] after achieving pain control using Lidocaine 4% Topical Solution. No specimens were taken. A time out was conducted at 14:02, prior to the start of the procedure. A Minimum amount of bleeding was controlled with Pressure. The procedure was tolerated well. Post Debridement Measurements: 0.8cm length x 0.9cm width x 0.2cm depth;  0.113cm^3 volume. Post debridement Stage noted as Category/Stage III. Character of Wound/Ulcer Post Debridement is improved. Post  procedure Diagnosis Wound #1: Same as Pre-Procedure Brian Brian [name], Brian Brian [name] ([id number]) [phone].pdf Page 8 of 10 Plan Follow-up Appointments: Return appointment in 1 month. - Dr. Lady Gary - room 2 Anesthetic: (In clinic) Topical Lidocaine 4% applied to wound bed Bathing/ Shower/ Hygiene: May shower and wash wound with soap and water. - with dressing changes Off-Loading: Turn and reposition every 2 hours - stay off of left elbow wear prevalon boot on elbow as directed Other: - limit time in wheelchair, reposition in wheelchair every 1-2 hours Home Health: No change in wound care orders this week; continue Home Health for wound care. May utilize formulary equivalent dressing for wound treatment orders unless otherwise specified. - physical therapy once a week, wound care twice a week Other Home Health Orders/Instructions: - [facility] The following medication(s) was prescribed: lidocaine topical 4 % cream cream topical was prescribed at facility WOUND #1: - Elbow Wound Laterality: Left Cleanser: Soap and Water Every Other Day/30 Days Discharge Instructions: May shower and wash wound with dial antibacterial soap and water prior to dressing change. Cleanser: Wound Cleanser (Generic) Every Other Day/30 Days Discharge Instructions: Cleanse the wound with wound cleanser prior to applying a clean dressing using gauze sponges, not tissue or cotton balls. Topical: Gentamicin Every Other Day/30 Days Discharge Instructions: As directed by physician Topical: Mupirocin Ointment Every Other Day/30 Days Discharge Instructions: Apply Mupirocin (Bactroban) as instructed Prim Dressing: Hydrofera Blue Ready Transfer Foam, 2.5x2.5 (in/in) Every Other Day/30 Days ary Discharge Instructions: Apply directly to wound bed as directed Secondary Dressing: ALLEVYN Gentle  Border, 5x5 (in/in) (Generic) Every Other Day/30 Days Discharge Instructions: Apply over primary dressing as directed. Add-Ons: Cotton Tip Applicator, 6 (in) (Generic) Every Other Day/30 Days 05/29/2023: The wound is smaller by perhaps half a centimeter this visit. The hypertrophic granulation tissue has not reaccumulated. There is some slough on the wound surface. I used a curette to debride slough from the wound surface. We will continue topical gentamicin and mupirocin with Hydrofera Blue. Follow-up in 1 month per patient and son request. Electronic Signature(s) Signed: 05/29/2023 2:17:00 PM By: Duanne Guess MD FACS Previous Signature: 05/29/2023 2:12:22 PM Version By: Duanne Guess MD FACS Entered By: Duanne Guess on 05/29/2023 14:17:00 -------------------------------------------------------------------------------- Brian Details Patient Name: Date of Service: Brian Brian Brian [name] ID Brian [name] 05/29/2023 2:15 PM Medical Record Number: [id number] Patient Account Number: [id number] Date of Birth/Sex: Treating RN: 07/04/1933 (89 y.o. Brian [name]) Primary Care Provider: Nadene Rubins Other Clinician: Referring Provider: Treating Provider/Extender: [name] in Treatment: 67 Information Obtained From Patient Constitutional Symptoms (General Health) Medical History: Past Medical History Notes: obseity Eyes Medical History: Positive for: Cataracts - removed Ear/Nose/Mouth/Throat Medical History: Brian Brian [name], Brian Brian [name] ([id number]) [id number] Page 9 of 10 Past Medical History Notes: wears hearing aids Cardiovascular Medical History: Positive for: Arrhythmia - Atrial Flutter/Atrial Fib; Coronary Artery Disease; Hypotension Negative for: Hypertension Past Medical History Notes: Mitral Valve Repair, Genitourinary Medical History: Past Medical History Notes: BPH Musculoskeletal Medical History: Positive for:  Osteoarthritis Neurologic Medical History: Negative for: Paraplegia Past Medical History Notes: CVA 2018, left side hemiparesis HBO Extended History Items Eyes: Cataracts Immunizations Pneumococcal Vaccine: Received Pneumococcal Vaccination: Yes Received Pneumococcal Vaccination On or After 60th Birthday: Yes Implantable Devices None Hospitalization / Surgery History Type of Hospitalization/Surgery open heart surgery IR generic histoircal appendectomy hernia repair joint replacement mitral valve repair mvp repair total hip arthroplasty Family and Social History Cancer: No; Diabetes: Yes - Maternal Grandparents; Heart Disease: Yes - Mother; Hereditary Spherocytosis: No; Hypertension: Yes -  Mother; Kidney Disease: No; Lung Disease: No; Seizures: No; Stroke: No; Thyroid Problems: No; Tuberculosis: No; Never smoker; Marital Status - Married; Alcohol Use: Never; Drug Use: No History; Caffeine Use: Never; Financial Concerns: No; Food, Clothing or Shelter Needs: No; Support System Lacking: No; Transportation Concerns: No Electronic Signature(s) Signed: 05/29/2023 2:27:24 PM By: Duanne Guess MD FACS Entered By: Duanne Guess on 05/29/2023 14:11:06 -------------------------------------------------------------------------------- SuperBill Details Patient Name: Date of Service: Brian Brian [name] ID Brian [name] 05/29/2023 Medical Record Number: [id number] Patient Account Number: [id number] Date of Birth/Sex: Treating RN: [birth year] (89 y.o. 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 I48.19 Other persistent atrial  fibrillation I25.10 Atherosclerotic heart disease of native coronary artery without angina pectoris Facility Procedures : CPT4 Code: [id number] Description: 7597 - DEBRIDE WOUND 1ST 20 SQ CM OR < ICD-10 Diagnosis Description L89.023 Pressure ulcer of left elbow, stage 3 Modifier: Quantity: 1 Physician Procedures : CPT4 Code Description Modifier [id number] - WC PHYS LEVEL 4 - EST PT 25 ICD-10 Diagnosis Description L89.023 Pressure ulcer of left elbow, stage 3 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

## 2023-05-30 DIAGNOSIS — Z7901 Long term (current) use of anticoagulants: Secondary | ICD-10-CM | POA: Diagnosis not present

## 2023-05-30 DIAGNOSIS — I69354 Hemiplegia and hemiparesis following cerebral infarction affecting left non-dominant side: Secondary | ICD-10-CM | POA: Diagnosis not present

## 2023-05-30 DIAGNOSIS — I119 Hypertensive heart disease without heart failure: Secondary | ICD-10-CM | POA: Diagnosis not present

## 2023-05-30 DIAGNOSIS — I2510 Atherosclerotic heart disease of native coronary artery without angina pectoris: Secondary | ICD-10-CM | POA: Diagnosis not present

## 2023-05-30 DIAGNOSIS — L89023 Pressure ulcer of left elbow, stage 3: Secondary | ICD-10-CM | POA: Diagnosis not present

## 2023-05-30 DIAGNOSIS — I4819 Other persistent atrial fibrillation: Secondary | ICD-10-CM | POA: Diagnosis not present

## 2023-06-02 DIAGNOSIS — G894 Chronic pain syndrome: Secondary | ICD-10-CM | POA: Diagnosis not present

## 2023-06-02 DIAGNOSIS — I4891 Unspecified atrial fibrillation: Secondary | ICD-10-CM | POA: Diagnosis not present

## 2023-06-02 DIAGNOSIS — Z7401 Bed confinement status: Secondary | ICD-10-CM | POA: Diagnosis not present

## 2023-06-02 DIAGNOSIS — F419 Anxiety disorder, unspecified: Secondary | ICD-10-CM | POA: Diagnosis not present

## 2023-06-05 ENCOUNTER — Encounter: Payer: Self-pay | Admitting: Student in an Organized Health Care Education/Training Program

## 2023-06-05 ENCOUNTER — Ambulatory Visit
Payer: Medicare Other | Attending: Student in an Organized Health Care Education/Training Program | Admitting: Student in an Organized Health Care Education/Training Program

## 2023-06-05 VITALS — BP 121/66 | HR 102 | Temp 97.9°F | Resp 16 | Ht 66.0 in | Wt 190.0 lb

## 2023-06-05 DIAGNOSIS — M48062 Spinal stenosis, lumbar region with neurogenic claudication: Secondary | ICD-10-CM | POA: Diagnosis not present

## 2023-06-05 DIAGNOSIS — Z0289 Encounter for other administrative examinations: Secondary | ICD-10-CM

## 2023-06-05 DIAGNOSIS — M4726 Other spondylosis with radiculopathy, lumbar region: Secondary | ICD-10-CM

## 2023-06-05 DIAGNOSIS — G8929 Other chronic pain: Secondary | ICD-10-CM | POA: Diagnosis not present

## 2023-06-05 DIAGNOSIS — G894 Chronic pain syndrome: Secondary | ICD-10-CM

## 2023-06-05 DIAGNOSIS — M47816 Spondylosis without myelopathy or radiculopathy, lumbar region: Secondary | ICD-10-CM | POA: Diagnosis not present

## 2023-06-05 DIAGNOSIS — M5136 Other intervertebral disc degeneration, lumbar region with discogenic back pain only: Secondary | ICD-10-CM

## 2023-06-05 DIAGNOSIS — M5416 Radiculopathy, lumbar region: Secondary | ICD-10-CM | POA: Insufficient documentation

## 2023-06-05 DIAGNOSIS — M170 Bilateral primary osteoarthritis of knee: Secondary | ICD-10-CM

## 2023-06-05 MED ORDER — OXYCODONE-ACETAMINOPHEN 7.5-325 MG PO TABS: 1.0000 | ORAL_TABLET | Freq: Two times a day (BID) | ORAL | 0 refills | Status: AC | PRN

## 2023-06-05 MED ORDER — OXYCODONE-ACETAMINOPHEN 7.5-325 MG PO TABS: 1.0000 | ORAL_TABLET | Freq: Two times a day (BID) | ORAL | 0 refills | Status: DC | PRN

## 2023-06-05 MED ORDER — PREGABALIN 25 MG PO CAPS
25.0000 mg | ORAL_CAPSULE | Freq: Every day | ORAL | 5 refills | Status: DC
Start: 2023-06-05 — End: 2023-09-04

## 2023-06-05 NOTE — Progress Notes (Signed)
PROVIDER NOTE: Information contained herein reflects review and annotations entered in association with encounter. Interpretation of such information and data should be left to medically-trained personnel. Information provided to patient can be located elsewhere in the medical record under "Patient Instructions". Document created using STT-dictation technology, any transcriptional errors that may result from process are unintentional.    Patient: Brian [name]  Service Category: E/M  Provider: Edward Jolly, MD  DOB: 04/03/1933  DOS: 06/05/2023  Referring Provider: Mliss Sax  MRN: [id number]  Specialty: Interventional Pain Management  PCP: Mliss Sax, MD  Type: Established Patient  Setting: Ambulatory outpatient    Location: Office  Delivery: Face-to-face     HPI  [name], a 89 y.o. year old male, is here today because of his Chronic pain syndrome [G89.4]. [name] primary complain today is Elbow Pain (bilat) and Shoulder Pain (bilat) Last encounter: My last encounter with him was on 03/27/23 Pertinent problems: [name] has Dysarthria, post-stroke; Chronic radicular lumbar pain; Gait disturbance; Paraplegic immobility syndrome; History of cerebrovascular accident; Depression with anxiety; Lumbar spondylosis; Lumbar degenerative disc disease; Bilateral primary osteoarthritis of knee; Pain management contract signed; and Chronic pain syndrome on their pertinent problem list. Pain Assessment: Severity of Chronic pain is reported as a 5 /10. Location: Elbow Right, Left (left is worse)/ . Onset: More than a month ago. Quality:  . Timing: Constant. Modifying factor(s):  Marland Kitchen Vitals:  height is 5\' 6"  (1.676 m) and weight is 190 lb (86.2 kg). His temperature is 97.9 F (36.6 C). His blood pressure is 121/66 and his pulse is 102 (abnormal). His respiration is 16 and oxygen saturation is 98%.   Reason for encounter: medication management.   No change in medical history  since last visit.  Patient's pain is at baseline.  Patient continues multimodal pain regimen as prescribed.  States that it provides pain relief and improvement in functional status.   04/11/22: 2nd pt visit No significant change in his medical history.  Patient unable to submit a urine toxicology screen so we obtained a serum drug screen. We will have patient sign pain contract.  He was accompanied today by his son and their CMA Prescription for Percocet as below 7.5 mg twice daily as needed which was being managed by their primary care provider, Dr. Doreene Burke Also recommend restarting Lyrica which the patient was on before, 25 mg nightly as a pain adjunct.   HPI from initial clinic visit: Mr. Hauschild is a pleasant 89 year old male who is accompanied today by his son and his nursing aide.  He has a longstanding history of diffuse arthritis most pronounced in his glenohumeral joints and knees.  He has tried physical therapy and continues to engage in participate in physical therapy twice a week.  He has a history of stroke with associated hemiparesis.  He has significant thoracic kyphosis.  He has difficulty ambulating and presents today in a wheelchair.  He also has bilateral hip osteoarthritis.  He was previously on Percocet 5 mg twice a day which became less effective and as a result, his primary care provider increased it to 7.5 mg twice a day which he is not getting any benefit from.  He is currently anticoagulated on Eliquis for his coronary artery disease.  He is also on Lyrica 25 mg twice a day.  He is also on Seroquel and Zoloft for depression management.    Pharmacotherapy Assessment  Analgesic:  Percocet 7.5 mg BID PRN Lyrica 25 mg  QHS  Monitoring: [place] PMP: PDMP not reviewed this encounter.       Pharmacotherapy: No side-effects or adverse reactions reported. Compliance: No problems identified. Effectiveness: Clinically acceptable.  Nonah Mattes, RN  06/05/2023  2:43 PM  Sign when  Signing Visit Nursing Pain Medication Assessment:  Safety precautions to be maintained throughout the outpatient stay will include: orient to surroundings, keep bed in low position, maintain call bell within reach at all times, provide assistance with transfer out of bed and ambulation.  Medication Inspection Compliance: Pill count conducted under aseptic conditions, in front of the patient. Neither the pills nor the bottle was removed from the patient's sight at any time. Once count was completed pills were immediately returned to the patient in their original bottle.  Medication: Oxycodone/APAP Pill/Patch Count:  0 of 60 pills remain Pill/Patch Appearance: Markings consistent with prescribed medication Bottle Appearance: Standard pharmacy container. Clearly labeled. Filled Date: 7 / 17 / 2024 Last Medication intake:  Today    No results found for: "CBDTHCR" No results found for: "D8THCCBX" No results found for: "D9THCCBX"  UDS:  Summary  Date Value Ref Range Status  12/27/2021 Note  Final    Comment:    ==================================================================== Compliance Drug Analysis, Ur ==================================================================== Test                             Result       Flag       Units  Drug Present and Declared for Prescription Verification   Oxycodone                      2038         EXPECTED   ng/mg creat   Oxymorphone                    6930         EXPECTED   ng/mg creat   Noroxycodone                   746          EXPECTED   ng/mg creat   Noroxymorphone                 488          EXPECTED   ng/mg creat    Sources of oxycodone are scheduled prescription medications.    Oxymorphone, noroxycodone, and noroxymorphone are expected    metabolites of oxycodone. Oxymorphone is also available as a    scheduled prescription medication.    Pregabalin                     PRESENT      EXPECTED   Sertraline                     PRESENT       EXPECTED   Desmethylsertraline            PRESENT      EXPECTED    Desmethylsertraline is an expected metabolite of sertraline.    Quetiapine                     PRESENT      EXPECTED   Acetaminophen                  PRESENT      EXPECTED  Drug  Absent but Declared for Prescription Verification   Alprazolam                     Not Detected UNEXPECTED ng/mg creat   Metoprolol                     Not Detected UNEXPECTED ==================================================================== Test                      Result    Flag   Units      Ref Range   Creatinine              100              mg/dL      >=96 ==================================================================== Declared Medications:  The flagging and interpretation on this report are based on the  following declared medications.  Unexpected results may arise from  inaccuracies in the declared medications.   **Note: The testing scope of this panel includes these medications:   Alprazolam (Xanax)  Metoprolol  Oxycodone (Percocet)  Pregabalin (Lyrica)  Quetiapine (Seroquel)  Sertraline (Zoloft)   **Note: The testing scope of this panel does not include small to  moderate amounts of these reported medications:   Acetaminophen (Tylenol)  Acetaminophen (Percocet)   **Note: The testing scope of this panel does not include the  following reported medications:   Apixaban (Eliquis)  Cetirizine (Zyrtec)  Finasteride (Proscar)  Losartan (Cozaar)  Multivitamin  Pantoprazole (Protonix)  Polyethylene Glycol (MiraLAX)  Pravastatin  Tamsulosin (Flomax) ==================================================================== For clinical consultation, please call [phone]. ====================================================================       ROS  Constitutional: Denies any fever or chills Gastrointestinal: No reported hemesis, hematochezia, vomiting, or acute GI distress Musculoskeletal:  Diffuse  musculoskeletal pain Neurological:  Dysarthria and dysphagia from previous stroke  Medication Review  ALPRAZolam, Furosemide, Mupirocin, QUEtiapine, apixaban, cetirizine, clotrimazole, finasteride, fluticasone, furosemide, gentamicin ointment, hydrocortisone ointment, melatonin, metoprolol succinate, multivitamin, oxyCODONE-acetaminophen, pantoprazole, polyethylene glycol powder, pravastatin, pregabalin, sertraline, and tamsulosin  History Review  Allergy: [name] is allergic to novocain [procaine hcl], other, and penicillins. Drug: [name]  reports no history of drug use. Alcohol:  reports no history of alcohol use. Tobacco:  reports that he has quit smoking. He has never used smokeless tobacco. Social: Brian [name]  reports that he has quit smoking. He has never used smokeless tobacco. He reports that he does not drink alcohol and does not use drugs. Medical:  has a past medical history of Anemia, Atrial flutter (HCC), BPH (benign prostatic hypertrophy), Epistaxis, Hemorrhage of gastrointestinal tract, unspecified, History of open heart surgery, Hypertension, Obesity (BMI 30.0-34.9) (06/29/2020), Osteoarthrosis, unspecified whether generalized or localized, unspecified site, Personal history of venous thrombosis and embolism, Rosacea, Stroke (HCC), and TIA (transient ischemic attack). Surgical: Brian [name]  has a past surgical history that includes Appendectomy; Hernia repair; Total hip arthroplasty; Joint replacement; mvp repair; Mitral valve repair; Radiology with anesthesia (N/A, 05/04/2016); ir generic historical (05/04/2016); ir generic historical (05/04/2016); and ir generic historical (06/07/2016). Family: family history includes Coronary artery disease in his father; Rheumatic fever in his mother.  Laboratory Chemistry Profile   Renal Lab Results  Component Value Date   BUN 18 04/24/2023   CREATININE 0.68 (L) 04/24/2023   BCR 26 (H) 04/24/2023   GFR 84.36 11/01/2021   GFRAA >60  11/03/2018   GFRNONAA >60 04/11/2023    Hepatic Lab Results  Component Value Date   AST 20 04/11/2023   ALT  15 04/11/2023   ALBUMIN 3.3 (L) 04/11/2023   ALKPHOS 61 04/11/2023   LIPASE 23 03/14/2022    Electrolytes Lab Results  Component Value Date   NA 141 04/24/2023   K 4.1 04/24/2023   CL 102 04/24/2023   CALCIUM 8.7 04/24/2023   MG 1.7 03/15/2017   PHOS 2.4 (L) 05/07/2016    Bone No results found for: "VD25OH", "VD125OH2TOT", "WU9811BJ4", "NW2956OZ3", "25OHVITD1", "25OHVITD2", "25OHVITD3", "TESTOFREE", "TESTOSTERONE"  Inflammation (CRP: Acute Phase) (ESR: Chronic Phase) Lab Results  Component Value Date   LATICACIDVEN 1.9 03/15/2022         Note: Above Lab results reviewed.  Recent Imaging Review  CT Angio Chest PE W and/or Wo Contrast CLINICAL DATA:  Pulmonary embolism (PE) suspected, high prob. Intermittent shortness of breath. Pain to left flank. No known injury.  EXAM: CT ANGIOGRAPHY CHEST WITH CONTRAST  TECHNIQUE: Multidetector CT imaging of the chest was performed using the standard protocol during bolus administration of intravenous contrast. Multiplanar CT image reconstructions and MIPs were obtained to evaluate the vascular anatomy.  RADIATION DOSE REDUCTION: This exam was performed according to the departmental dose-optimization program which includes automated exposure control, adjustment of the mA and/or kV according to patient size and/or use of iterative reconstruction technique.  CONTRAST:  75mL OMNIPAQUE IOHEXOL 350 MG/ML SOLN  COMPARISON:  CT angiography chest from 03/15/2022.  FINDINGS: Cardiovascular: No evidence of embolism to the proximal subsegmental pulmonary artery level.  Mild cardiomegaly. No pericardial effusion. No aortic aneurysm. There are minimal coronary artery and aortic artery atherosclerotic vascular calcifications. Prosthetic mitral valve noted.  Mediastinum/Nodes: Visualized thyroid gland appears  grossly unremarkable. No solid / cystic mediastinal masses. The esophagus is nondistended precluding optimal assessment. No axillary, mediastinal or hilar lymphadenopathy by size criteria.  Lungs/Pleura: The central tracheo-bronchial tree is patent. There is small left and trace right pleural effusion. There are associated compressive atelectatic changes in the bilateral lower lobes. No mass, consolidation or pneumothorax. No suspicious lung nodules.  Upper Abdomen: Redemonstration of several hypoattenuating structures in the liver without significant interval change. The largest such structure is in the left hepatic lobe, segment 4B measuring up to 1.6 x 2.0 cm. Remaining visualized upper abdominal viscera within normal limits.  Musculoskeletal: Sternotomy wires noted. The visualized soft tissues of the chest wall are grossly unremarkable. No suspicious osseous lesions. There are mild multilevel degenerative changes in the visualized spine. A hemangioma is noted in the T3 vertebral body.  Review of the MIP images confirms the above findings.  IMPRESSION: 1. No evidence of pulmonary embolism. 2. Small left and trace right pleural effusions with associated compressive atelectatic changes in the bilateral lower lobes. 3. Multiple other nonacute observations, as described above.  Aortic Atherosclerosis (ICD10-I70.0).  Electronically Signed   By: Jules Schick M.D.   On: 04/11/2023 15:59 DG Chest Port 1 View CLINICAL DATA:  CP/SOB  EXAM: PORTABLE CHEST 1 VIEW  COMPARISON:  CXR 03/14/22  FINDINGS: Cardiomegaly. Small left pleural effusion. No pneumothorax. Status post median sternotomy. No focal airspace opacity. Prominent bilateral interstitial opacities could represent pulmonary venous congestion or atypical infection. No radiographically apparent displaced rib fractures. Visualized upper abdomen is unremarkable.  IMPRESSION: 1. Cardiomegaly with small left pleural  effusion. 2. Prominent bilateral interstitial opacities could represent pulmonary venous congestion or atypical infection.  Electronically Signed   By: Lorenza Cambridge M.D.   On: 04/11/2023 13:47 Note: Reviewed        Physical Exam  General appearance: Well nourished, well developed, and  well hydrated. In no apparent acute distress Mental status: Alert, oriented x 3 (person, place, & time)       Respiratory: No evidence of acute respiratory distress Eyes: PERLA Vitals: BP 121/66   Pulse (!) 102   Temp 97.9 F (36.6 C)   Resp 16   Ht 5\' 6"  (1.676 m)   Wt 190 lb (86.2 kg)   SpO2 98%   BMI 30.67 kg/m  BMI: Estimated body mass index is 30.67 kg/m as calculated from the following:   Height as of this encounter: 5\' 6"  (1.676 m).   Weight as of this encounter: 190 lb (86.2 kg). Ideal: Ideal body weight: 63.8 kg (140 lb 10.5 oz) Adjusted ideal body weight: 72.8 kg (160 lb 6.3 oz)  Patient is severely deconditioned, severe thoracic kyphosis and scoliosis.  Presents in wheelchair. Severe deconditioning.  Assessment   Diagnosis  1. Chronic pain syndrome   2. Lumbar spondylosis   3. Chronic radicular lumbar pain   4. Degeneration of intervertebral disc of lumbar region with discogenic back pain   5. Bilateral primary osteoarthritis of knee   6. Spinal stenosis, lumbar region, with neurogenic claudication   7. Pain management contract signed      Plan of Care    Brian [name] has a current medication list which includes the following long-term medication(s): cetirizine, eliquis, furosemide, furosemide, metoprolol succinate, pantoprazole, pravastatin, pregabalin, quetiapine, and sertraline.  Pharmacotherapy (Medications Ordered): Meds ordered this encounter  Medications   oxyCODONE-acetaminophen (PERCOCET) 7.5-325 MG tablet    Sig: Take 1 tablet by mouth 2 (two) times daily as needed for severe pain (pain score 7-10).    Dispense:  60 tablet    Refill:  0    oxyCODONE-acetaminophen (PERCOCET) 7.5-325 MG tablet    Sig: Take 1 tablet by mouth 2 (two) times daily as needed for severe pain (pain score 7-10).    Dispense:  60 tablet    Refill:  0   oxyCODONE-acetaminophen (PERCOCET) 7.5-325 MG tablet    Sig: Take 1 tablet by mouth 2 (two) times daily as needed for severe pain (pain score 7-10).    Dispense:  60 tablet    Refill:  0   Orders:  Orders Placed This Encounter  Procedures   Drug Screen 10 W/Conf, Serum    Order Specific Question:   Release to patient    Answer:   Immediate   Follow-up plan:   Return in about 3 months (around 09/05/2023) for MM, F2F.    Recent Visits Date Type Provider Dept  03/27/23 Office Visit Edward Jolly, MD [facility]  Showing recent visits within past 90 days and meeting all other requirements Today's Visits Date Type Provider Dept  06/05/23 Office Visit Edward Jolly, MD [facility]  Showing today's visits and meeting all other requirements Future Appointments No visits were found meeting these conditions. Showing future appointments within next 90 days and meeting all other requirements  I discussed the assessment and treatment plan with the patient. The patient was provided an opportunity to ask questions and all were answered. The patient agreed with the plan and demonstrated an understanding of the instructions.  Patient advised to call back or seek an in-person evaluation if the symptoms or condition worsens.  Duration of encounter: .  Total time on encounter, as per AMA guidelines included both the face-to-face and non-face-to-face time personally spent by the physician and/or other qualified health care professional(s) on the day of the encounter (  includes time in activities that require the physician or other qualified health care professional and does not include time in activities normally performed by clinical staff). Physician's time may include the following  activities when performed: preparing to see the patient (eg, review of tests, pre-charting review of records) obtaining and/or reviewing separately obtained history performing a medically appropriate examination and/or evaluation counseling and educating the patient/family/caregiver ordering medications, tests, or procedures referring and communicating with other health care professionals (when not separately reported) documenting clinical information in the electronic or other health record independently interpreting results (not separately reported) and communicating results to the patient/ family/caregiver care coordination (not separately reported)  Note by: Edward Jolly, MD Date: 06/05/2023; Time: 3:08 PM

## 2023-06-05 NOTE — Addendum Note (Signed)
Addended by: Edward Jolly on: 06/05/2023 03:16 PM   Modules accepted: Orders

## 2023-06-05 NOTE — Progress Notes (Signed)
Nursing Pain Medication Assessment:  Safety precautions to be maintained throughout the outpatient stay will include: orient to surroundings, keep bed in low position, maintain call bell within reach at all times, provide assistance with transfer out of bed and ambulation.  Medication Inspection Compliance: Pill count conducted under aseptic conditions, in front of the patient. Neither the pills nor the bottle was removed from the patient's sight at any time. Once count was completed pills were immediately returned to the patient in their original bottle.  Medication: Oxycodone/APAP Pill/Patch Count:  0 of 60 pills remain Pill/Patch Appearance: Markings consistent with prescribed medication Bottle Appearance: Standard pharmacy container. Clearly labeled. Filled Date: 7 / 17 / 2024 Last Medication intake:  Today

## 2023-06-07 DIAGNOSIS — L89023 Pressure ulcer of left elbow, stage 3: Secondary | ICD-10-CM | POA: Diagnosis not present

## 2023-06-07 DIAGNOSIS — I119 Hypertensive heart disease without heart failure: Secondary | ICD-10-CM | POA: Diagnosis not present

## 2023-06-07 DIAGNOSIS — I69354 Hemiplegia and hemiparesis following cerebral infarction affecting left non-dominant side: Secondary | ICD-10-CM | POA: Diagnosis not present

## 2023-06-07 DIAGNOSIS — I4819 Other persistent atrial fibrillation: Secondary | ICD-10-CM | POA: Diagnosis not present

## 2023-06-07 DIAGNOSIS — I2510 Atherosclerotic heart disease of native coronary artery without angina pectoris: Secondary | ICD-10-CM | POA: Diagnosis not present

## 2023-06-07 DIAGNOSIS — Z7901 Long term (current) use of anticoagulants: Secondary | ICD-10-CM | POA: Diagnosis not present

## 2023-06-11 DIAGNOSIS — I2510 Atherosclerotic heart disease of native coronary artery without angina pectoris: Secondary | ICD-10-CM | POA: Diagnosis not present

## 2023-06-11 DIAGNOSIS — L89023 Pressure ulcer of left elbow, stage 3: Secondary | ICD-10-CM | POA: Diagnosis not present

## 2023-06-11 DIAGNOSIS — Z7901 Long term (current) use of anticoagulants: Secondary | ICD-10-CM | POA: Diagnosis not present

## 2023-06-11 DIAGNOSIS — I4819 Other persistent atrial fibrillation: Secondary | ICD-10-CM | POA: Diagnosis not present

## 2023-06-11 DIAGNOSIS — I119 Hypertensive heart disease without heart failure: Secondary | ICD-10-CM | POA: Diagnosis not present

## 2023-06-11 DIAGNOSIS — I69354 Hemiplegia and hemiparesis following cerebral infarction affecting left non-dominant side: Secondary | ICD-10-CM | POA: Diagnosis not present

## 2023-06-13 LAB — OXYCODONES,MS,WB/SP RFX
Oxycocone: 8.7 ng/mL
Oxycodones Confirmation: POSITIVE
Oxymorphone: NEGATIVE ng/mL

## 2023-06-13 LAB — DRUG SCREEN 10 W/CONF, SERUM
Amphetamines, IA: NEGATIVE ng/mL
Barbiturates, IA: NEGATIVE ug/mL
Benzodiazepines, IA: NEGATIVE ng/mL
Cocaine & Metabolite, IA: NEGATIVE ng/mL
Methadone, IA: NEGATIVE ng/mL
Opiates, IA: NEGATIVE ng/mL
Phencyclidine, IA: NEGATIVE ng/mL
Propoxyphene, IA: NEGATIVE ng/mL
THC(Marijuana) Metabolite, IA: NEGATIVE ng/mL

## 2023-06-19 ENCOUNTER — Encounter: Payer: Medicare Other | Admitting: Student in an Organized Health Care Education/Training Program

## 2023-06-21 ENCOUNTER — Encounter: Payer: Self-pay | Admitting: Gastroenterology

## 2023-06-21 DIAGNOSIS — L89023 Pressure ulcer of left elbow, stage 3: Secondary | ICD-10-CM | POA: Diagnosis not present

## 2023-06-21 DIAGNOSIS — R1312 Dysphagia, oropharyngeal phase: Secondary | ICD-10-CM | POA: Diagnosis not present

## 2023-06-21 DIAGNOSIS — I2510 Atherosclerotic heart disease of native coronary artery without angina pectoris: Secondary | ICD-10-CM | POA: Diagnosis not present

## 2023-06-21 DIAGNOSIS — I69354 Hemiplegia and hemiparesis following cerebral infarction affecting left non-dominant side: Secondary | ICD-10-CM | POA: Diagnosis not present

## 2023-06-21 DIAGNOSIS — I119 Hypertensive heart disease without heart failure: Secondary | ICD-10-CM | POA: Diagnosis not present

## 2023-06-21 DIAGNOSIS — Z7901 Long term (current) use of anticoagulants: Secondary | ICD-10-CM | POA: Diagnosis not present

## 2023-06-21 DIAGNOSIS — Z79891 Long term (current) use of opiate analgesic: Secondary | ICD-10-CM | POA: Diagnosis not present

## 2023-06-21 DIAGNOSIS — Z952 Presence of prosthetic heart valve: Secondary | ICD-10-CM | POA: Diagnosis not present

## 2023-06-21 DIAGNOSIS — Z86718 Personal history of other venous thrombosis and embolism: Secondary | ICD-10-CM | POA: Diagnosis not present

## 2023-06-21 DIAGNOSIS — M179 Osteoarthritis of knee, unspecified: Secondary | ICD-10-CM | POA: Diagnosis not present

## 2023-06-21 DIAGNOSIS — I4819 Other persistent atrial fibrillation: Secondary | ICD-10-CM | POA: Diagnosis not present

## 2023-06-22 ENCOUNTER — Telehealth: Payer: Self-pay | Admitting: Family Medicine

## 2023-06-22 DIAGNOSIS — M623 Immobility syndrome (paraplegic): Secondary | ICD-10-CM

## 2023-06-22 NOTE — Telephone Encounter (Signed)
The patient called and said he is requesting PT- physical therapy. Please give the patient a call

## 2023-06-25 ENCOUNTER — Encounter: Payer: Self-pay | Admitting: Family Medicine

## 2023-06-25 DIAGNOSIS — I4819 Other persistent atrial fibrillation: Secondary | ICD-10-CM | POA: Diagnosis not present

## 2023-06-25 DIAGNOSIS — I69354 Hemiplegia and hemiparesis following cerebral infarction affecting left non-dominant side: Secondary | ICD-10-CM | POA: Diagnosis not present

## 2023-06-25 DIAGNOSIS — I2510 Atherosclerotic heart disease of native coronary artery without angina pectoris: Secondary | ICD-10-CM | POA: Diagnosis not present

## 2023-06-25 DIAGNOSIS — L89023 Pressure ulcer of left elbow, stage 3: Secondary | ICD-10-CM | POA: Diagnosis not present

## 2023-06-25 DIAGNOSIS — I119 Hypertensive heart disease without heart failure: Secondary | ICD-10-CM | POA: Diagnosis not present

## 2023-06-25 DIAGNOSIS — Z7901 Long term (current) use of anticoagulants: Secondary | ICD-10-CM | POA: Diagnosis not present

## 2023-06-26 ENCOUNTER — Telehealth: Payer: Self-pay | Admitting: Student in an Organized Health Care Education/Training Program

## 2023-06-26 ENCOUNTER — Encounter (HOSPITAL_BASED_OUTPATIENT_CLINIC_OR_DEPARTMENT_OTHER): Payer: Medicare Other | Attending: General Surgery | Admitting: General Surgery

## 2023-06-26 DIAGNOSIS — S3120XA Unspecified open wound of penis, initial encounter: Secondary | ICD-10-CM | POA: Diagnosis not present

## 2023-06-26 DIAGNOSIS — L98411 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: Secondary | ICD-10-CM | POA: Diagnosis not present

## 2023-06-26 DIAGNOSIS — Z86718 Personal history of other venous thrombosis and embolism: Secondary | ICD-10-CM | POA: Diagnosis not present

## 2023-06-26 DIAGNOSIS — L89023 Pressure ulcer of left elbow, stage 3: Secondary | ICD-10-CM | POA: Insufficient documentation

## 2023-06-26 DIAGNOSIS — I4819 Other persistent atrial fibrillation: Secondary | ICD-10-CM | POA: Insufficient documentation

## 2023-06-26 DIAGNOSIS — I2510 Atherosclerotic heart disease of native coronary artery without angina pectoris: Secondary | ICD-10-CM | POA: Insufficient documentation

## 2023-06-26 DIAGNOSIS — L98491 Non-pressure chronic ulcer of skin of other sites limited to breakdown of skin: Secondary | ICD-10-CM | POA: Diagnosis not present

## 2023-06-26 DIAGNOSIS — L98412 Non-pressure chronic ulcer of buttock with fat layer exposed: Secondary | ICD-10-CM | POA: Insufficient documentation

## 2023-06-26 DIAGNOSIS — L304 Erythema intertrigo: Secondary | ICD-10-CM | POA: Diagnosis not present

## 2023-06-26 DIAGNOSIS — M47816 Spondylosis without myelopathy or radiculopathy, lumbar region: Secondary | ICD-10-CM

## 2023-06-26 DIAGNOSIS — I10 Essential (primary) hypertension: Secondary | ICD-10-CM | POA: Diagnosis not present

## 2023-06-26 DIAGNOSIS — I69954 Hemiplegia and hemiparesis following unspecified cerebrovascular disease affecting left non-dominant side: Secondary | ICD-10-CM | POA: Insufficient documentation

## 2023-06-26 DIAGNOSIS — G894 Chronic pain syndrome: Secondary | ICD-10-CM

## 2023-06-26 DIAGNOSIS — G8929 Other chronic pain: Secondary | ICD-10-CM

## 2023-06-26 DIAGNOSIS — S31821A Laceration without foreign body of left buttock, initial encounter: Secondary | ICD-10-CM | POA: Diagnosis not present

## 2023-06-26 NOTE — Telephone Encounter (Signed)
Spoke with Brian [name] from [facility].  Patient wants to start home health PT.  Dr Doreene Burke has not seen him recently, June 14th and the order has to be from a provider who has seen the patient within the last 90 days.  They are asking if [facility] can write for home health PT.  Will send message to [facility]

## 2023-06-26 NOTE — Telephone Encounter (Signed)
Called Angela/[facility] back to let her know that [facility] has placed the order for home health PT.

## 2023-06-26 NOTE — Progress Notes (Signed)
[name], BINFORD Brian Petit ([id number]) [id number] Page 1 of 11 Visit Report for 06/26/2023 Arrival Information Details Patient Name: Date of Service: Brian [name] 06/26/2023 2:15 PM Medical Record Number: [id number] Patient Account Number: [id number] Date of Birth/Sex: Treating RN: [birth year] (89 y.o. Brian Brian [name] Primary Care Ramiah Helfrich: Nadene Rubins Other Clinician: Referring Kaiesha Tonner: Treating Kameryn Tisdel/Extender: [name] in Treatment: 84 Visit Information History Since Last Visit Added or deleted any medications: No Patient Arrived: Wheel Chair Any new allergies or adverse reactions: No Arrival Time: 14:12 Had a fall or experienced change in No Accompanied By: son activities of daily living that may affect Transfer Assistance: Manual risk of falls: Patient Identification Verified: Yes Signs or symptoms of abuse/neglect since last visito No Secondary Verification Process Completed: Yes Hospitalized since last visit: No Patient Requires Transmission-Based Precautions: No Implantable device outside of the clinic excluding No Patient Has Alerts: Yes cellular tissue based products placed in the center Patient Alerts: Patient on Blood Thinner since last visit: Has Dressing in Place as Prescribed: Yes Pain Present Now: Yes Electronic Signature(s) Signed: 06/26/2023 3:30:49 PM By: Samuella Bruin Entered By: Samuella Bruin on 06/26/2023 11:12:32 -------------------------------------------------------------------------------- Encounter Discharge Information Details Patient Name: Date of Service: Brian [name] 06/26/2023 2:15 PM Medical Record Number: [id number] Patient Account Number: [id number] Date of Birth/Sex: Treating RN: 1933-08-06 (89 y.o. Brian Brian [name] Primary Care Mykenzie Ebanks: Nadene Rubins Other Clinician: Referring Tashan Kreitzer: Treating Kierah Goatley/Extender: [name] in Treatment: 29 Encounter Discharge Information Items Post Procedure Vitals Discharge Condition: Stable Temperature (F): 97.7 Ambulatory Status: Wheelchair Pulse (bpm): 77 Discharge Destination: Home Respiratory Rate (breaths/min): 18 Transportation: Private Auto Blood Pressure (mmHg): 116/64 Accompanied By: son Schedule Follow-up Appointment: Yes Clinical Summary of Care: Patient Declined Electronic Signature(s) Signed: 06/26/2023 3:30:49 PM By: Samuella Bruin Entered By: Samuella Bruin on 06/26/2023 11:53:40 [name], Brian [name] ([id number]) [id number] Page 2 of 11 -------------------------------------------------------------------------------- Lower Extremity Assessment Details Patient Name: Date of Service: Brian [name] 06/26/2023 2:15 PM Medical Record Number: [id number] Patient Account Number: [id number] Date of Birth/Sex: Treating RN: 20-Mar-1933 (89 y.o. Brian Brian [name] Primary Care Rayona Sardinha: Nadene Rubins Other Clinician: Referring Ilyana Manuele: Treating Azelia Reiger/Extender: [name] in Treatment: 16 Electronic Signature(s) Signed: 06/26/2023 3:30:49 PM By: Gelene Mink By: Samuella Bruin on 06/26/2023 11:13:03 -------------------------------------------------------------------------------- Multi Brian Chart Details Patient Name: Date of Service: Brian [name] 06/26/2023 2:15 PM Medical Record Number: [id number] Patient Account Number: [id number] Date of Birth/Sex: Treating RN: [birth year] (89 y.o. [name]) Primary Care Junice Fei: Nadene Rubins Other Clinician: Referring Chaddrick Brue: Treating Andelyn Spade/Extender: [name] in Treatment: 4 Vital Signs Height(in): 66 Pulse(bpm): 77 Weight(lbs): 185 Blood Pressure(mmHg): 116/64 Body Mass Index(BMI): 29.9 Temperature(F): 97.7 Respiratory Rate(breaths/min): 18 [1:Photos:] [4:No Photos] Left  Elbow Left Gluteus Penis Brian Location: Pressure Injury Shear/Friction Shear/Friction Wounding Event: Pressure Ulcer Skin Tear Abrasion Primary Etiology: Cataracts, Arrhythmia, Coronary Cataracts, Arrhythmia, Coronary Cataracts, Arrhythmia, Coronary Comorbid History: Artery Disease, Hypotension, Artery Disease, Hypotension, Artery Disease, Hypotension, Osteoarthritis Osteoarthritis Osteoarthritis 12/19/2021 06/18/2023 06/26/2023 Date Acquired: 71 0 0 Weeks of Treatment: Open Open Open Brian Status: No No No Brian Recurrence: Yes No No Clustered Brian: 0.5x0.4x0.2 7x1.5x0.1 0.2x1x0.1 Measurements L x W x D (cm) 0.157 8.247 0.157 A (cm) : rea 0.031 0.825 0.016 Volume (cm) : 98.40% [name] [name] % Reduction in Area: 96.80% [name] [name] % Reduction in Volume: Category/Stage III Full Thickness Without Exposed Full  Thickness Without Exposed Classification: Support Structures Support Structures Medium Medium Medium Exudate Amount: Serosanguineous Serosanguineous Serous Exudate Type: red, brown red, brown amber Exudate Color: Distinct, outline attached Distinct, outline attached Distinct, outline attached Brian Margin: Brian [name] ([id number]) [id number] Page 3 of 11 Small (1-33%) Large (67-100%) Small (1-33%) Granulation Amount: Pink, Pale Red Red, Pink Granulation Quality: Large (67-100%) Small (1-33%) Large (67-100%) Necrotic Amount: Adherent Slough Eschar, Adherent Slough Adherent Slough Necrotic Tissue: Fat Layer (Subcutaneous Tissue): Yes Fat Layer (Subcutaneous Tissue): Yes Fat Layer (Subcutaneous Tissue): Yes Exposed Structures: Fascia: No Fascia: No Fascia: No Tendon: No Tendon: No Tendon: No Muscle: No Muscle: No Muscle: No Joint: No Joint: No Joint: No Bone: No Bone: No Bone: No Medium (34-66%) None None Epithelialization: Debridement - Excisional Debridement - Excisional [name] Debridement: Pre-procedure Verification/Time Out  14:29 14:29 [name] Taken: Lidocaine 4% Topical Solution Lidocaine 4% Topical Solution [name] Pain Control: Subcutaneous, Slough Subcutaneous, Slough [name] Tissue Debrided: Skin/Subcutaneous Tissue Skin/Subcutaneous Tissue [name] Level: 0.16 8.24 [name] Debridement A (sq cm): rea Curette Curette [name] Instrument: Minimum Minimum [name] Bleeding: Pressure Pressure [name] Hemostasis A chieved: Procedure was tolerated well Procedure was tolerated well [name] Debridement Treatment Response: 0.5x0.4x0.2 7x1.5x0.1 [name] Post Debridement Measurements L x W x D (cm) 0.031 0.825 [name] Post Debridement Volume: (cm) Category/Stage III [name] [name] Post Debridement Stage: No Abnormalities Noted Rash: Yes No Abnormalities Noted Periwound Skin Texture: No Abnormalities Noted Dry/Scaly: Yes No Abnormalities Noted Periwound Skin Moisture: No Abnormalities Noted Rubor: Yes No Abnormalities Noted Periwound Skin Color: No Abnormality No Abnormality No Abnormality Temperature: Yes [name] [name] Tenderness on Palpation: Debridement Debridement [name] Procedures Performed: Treatment Notes Brian #1 (Elbow) Brian Laterality: Left Cleanser Soap and Water Discharge Instruction: May shower and wash Brian with dial antibacterial soap and water prior to dressing change. Brian Cleanser Discharge Instruction: Cleanse the Brian with Brian cleanser prior to applying a clean dressing using gauze sponges, not tissue or cotton balls. Peri-Brian Care Topical Gentamicin Discharge Instruction: As directed by physician Mupirocin Ointment Discharge Instruction: Apply Mupirocin (Bactroban) as instructed Primary Dressing Hydrofera Blue Ready Transfer Foam, 2.5x2.5 (in/in) Discharge Instruction: Apply directly to Brian bed as directed Secondary Dressing ALLEVYN Gentle Border, 5x5 (in/in) Discharge Instruction: Apply over primary dressing as directed. Secured With Compression Wrap Compression Stockings Add-Ons Cotton Microbiologist, 6  (in) Brian #3 (Gluteus) Brian Laterality: Left Cleanser Soap and Water Discharge Instruction: May shower and wash Brian with dial antibacterial soap and water prior to dressing change. Brian Cleanser Discharge Instruction: Cleanse the Brian with Brian cleanser prior to applying a clean dressing using gauze sponges, not tissue or cotton balls. Peri-Brian Care Topical Brian [name], Brian [name] ([id number]) [id number] Page 4 of 11 Primary Dressing Maxorb Extra Ag+ Alginate Dressing, 2x2 (in/in) Discharge Instruction: Apply to Brian bed as instructed Secondary Dressing Zetuvit Plus Silicone Border Dressing 5x5 (in/in) Discharge Instruction: Apply silicone border over primary dressing as directed. Secured With Compression Wrap Compression Stockings Add-Ons Brian #4 (Penis) Cleanser Soap and Water Discharge Instruction: May shower and wash Brian with dial antibacterial soap and water prior to dressing change. Brian Cleanser Discharge Instruction: Cleanse the Brian with Brian cleanser prior to applying a clean dressing using gauze sponges, not tissue or cotton balls. Peri-Brian Care Topical Miconazole Nitrate Powder, 2%, 2.5 (oz) bottle Discharge Instruction: Apply in groin folds. Primary Dressing Maxorb Extra Ag+ Alginate Dressing, 2x2 (in/in) Discharge Instruction: Apply to Brian bed as instructed Secondary Dressing Secured With Compression Wrap Compression Stockings Add-Ons Electronic Signature(s) Signed: 06/26/2023 3:11:42 PM  By: Duanne Guess MD FACS Entered By: Duanne Guess on 06/26/2023 12:11:42 -------------------------------------------------------------------------------- Multi-Disciplinary Care Plan Details Patient Name: Date of Service: Brian RDO [name] ID [name] 06/26/2023 2:15 PM Medical Record Number: [id number] Patient Account Number: [id number] Date of Birth/Sex: Treating RN: 08/21/1933 (89 y.o. Brian Brian [name] Primary Care Dennette Faulconer:  Nadene Rubins Other Clinician: Referring Yazmine Sorey: Treating Khamron Gellert/Extender: [name] in Treatment: 27 Multidisciplinary Care Plan reviewed with physician Active Inactive Abuse / Safety / Falls / Self Care Management Nursing Diagnoses: Impaired physical mobility Brian [name], Brian [name] ([id number]) [id number] Page 5 of 11 Potential for falls Goals: Patient/caregiver will verbalize understanding of skin care regimen Date Initiated: 02/14/2022 Date Inactivated: 04/17/2023 Target Resolution Date: 04/21/2023 Goal Status: Met Patient/caregiver will verbalize/demonstrate measures taken to prevent injury and/or falls Date Initiated: 02/14/2022 Target Resolution Date: 07/18/2023 Goal Status: Active Interventions: Assess fall risk on admission and as needed Assess self care needs on admission and as needed Notes: Brian/Skin Impairment Nursing Diagnoses: Impaired tissue integrity Knowledge deficit related to ulceration/compromised skin integrity Goals: Patient/caregiver will verbalize understanding of skin care regimen Date Initiated: 02/14/2022 Target Resolution Date: 07/18/2023 Goal Status: Active Ulcer/skin breakdown will have a volume reduction of 30% by week 4 Date Initiated: 02/14/2022 Date Inactivated: 05/23/2022 Target Resolution Date: 05/05/2022 Goal Status: Unmet Unmet Reason: new PU left glut Interventions: Assess patient/caregiver ability to obtain necessary supplies Assess patient/caregiver ability to perform ulcer/skin care regimen upon admission and as needed Assess ulceration(s) every visit Treatment Activities: Skin care regimen initiated : 02/14/2022 Topical Brian management initiated : 02/14/2022 Notes: Electronic Signature(s) Signed: 06/26/2023 3:30:49 PM By: Samuella Bruin Entered By: Samuella Bruin on 06/26/2023  11:33:31 -------------------------------------------------------------------------------- Pain Assessment Details Patient Name: Date of Service: Brian [name] 06/26/2023 2:15 PM Medical Record Number: [id number] Patient Account Number: [id number] Date of Birth/Sex: Treating RN: 1933-01-17 (89 y.o. Brian Brian [name] Primary Care Brian Authement: Nadene Rubins Other Clinician: Referring Emma Birchler: Treating Demareon Coldwell/Extender: [name] in Treatment: 65 Active Problems Location of Pain Severity and Description of Pain Patient Has Paino Yes Site Locations Pain Location: Brian [name], Brian [name] ([id number]) [id number] Page 6 of 11 Pain Location: Pain in Ulcers Duration of the Pain. Constant / Intermittento Constant Rate the pain. Current Pain Level: 5 Character of Pain Describe the Pain: Burning Pain Management and Medication Current Pain Management: Medication: Yes Electronic Signature(s) Signed: 06/26/2023 3:30:49 PM By: Samuella Bruin Entered By: Samuella Bruin on 06/26/2023 11:12:57 -------------------------------------------------------------------------------- Patient/Caregiver Education Details Patient Name: Date of Service: Brian [name] 11/5/2024andnbsp2:15 PM Medical Record Number: [id number] Patient Account Number: [id number] Date of Birth/Gender: Treating RN: 1933-04-08 (89 y.o. Brian Brian [name] Primary Care Physician: Nadene Rubins Other Clinician: Referring Physician: Treating Physician/Extender: [name] in Treatment: 47 Education Assessment Education Provided To: Patient Education Topics Provided Brian/Skin Impairment: Methods: Explain/Verbal Responses: Reinforcements needed, State content correctly Electronic Signature(s) Signed: 06/26/2023 3:30:49 PM By: Samuella Bruin Entered By: Samuella Bruin on 06/26/2023  11:33:41 -------------------------------------------------------------------------------- Brian [name], Brian V ID [name] 06/26/2023 2:15 PM Laughman, Heman Brian Petit ([id number]) [id number] Page 7 of 11 Medical Record Number: [id number] Patient Account Number: [id number] Date of Birth/Sex: Treating RN: 1932/12/31 (89 y.o. Brian Brian [name] Primary Care Fatimata Talsma: Nadene Rubins Other Clinician: Referring Matika Bartell: Treating Carolle Ishii/Extender: [name] in Treatment: 71 Brian Status Brian Number: 1 Primary Pressure Ulcer Etiology: Brian Location: Left Elbow Brian Status: Open Wounding Event: Pressure  Injury Comorbid Cataracts, Arrhythmia, Coronary Artery Disease, Date Acquired: 12/19/2021 History: Hypotension, Osteoarthritis Weeks Of Treatment: 71 Clustered Brian: Yes Photos Brian Measurements Length: (cm) 0.5 Width: (cm) 0.4 Depth: (cm) 0.2 Area: (cm) 0.157 Volume: (cm) 0.031 % Reduction in Area: 98.4% % Reduction in Volume: 96.8% Epithelialization: Medium (34-66%) Tunneling: No Undermining: No Brian Description Classification: Category/Stage III Brian Margin: Distinct, outline attached Exudate Amount: Medium Exudate Type: Serosanguineous Exudate Color: red, brown Foul Odor After Cleansing: No Slough/Fibrino Yes Brian Bed Granulation Amount: Small (1-33%) Exposed Structure Granulation Quality: Pink, Pale Fascia Exposed: No Necrotic Amount: Large (67-100%) Fat Layer (Subcutaneous Tissue) Exposed: Yes Necrotic Quality: Adherent Slough Tendon Exposed: No Muscle Exposed: No Joint Exposed: No Bone Exposed: No Periwound Skin Texture Texture Color No Abnormalities Noted: Yes No Abnormalities Noted: Yes Moisture Temperature / Pain No Abnormalities Noted: Yes Temperature: No Abnormality Tenderness on Palpation: Yes Treatment Notes Brian #1 (Elbow) Brian Laterality:  Left Cleanser Soap and Water Discharge Instruction: May shower and wash Brian with dial antibacterial soap and water prior to dressing change. Brian Cleanser Discharge Instruction: Cleanse the Brian with Brian cleanser prior to applying a clean dressing using gauze sponges, not tissue or cotton balls. Peri-Brian Care Topical Gentamicin Discharge Instruction: As directed by physician Brian [name] ([id number]) [id number] Page 8 of 11 Mupirocin Ointment Discharge Instruction: Apply Mupirocin (Bactroban) as instructed Primary Dressing Hydrofera Blue Ready Transfer Foam, 2.5x2.5 (in/in) Discharge Instruction: Apply directly to Brian bed as directed Secondary Dressing ALLEVYN Gentle Border, 5x5 (in/in) Discharge Instruction: Apply over primary dressing as directed. Secured With Compression Wrap Compression Stockings Add-Ons Engineer, maintenance, 6 (in) Electronic Signature(s) Signed: 06/26/2023 3:30:49 PM By: Samuella Bruin Entered By: Samuella Bruin on 06/26/2023 11:22:39 -------------------------------------------------------------------------------- Brian Assessment Details Patient Name: Date of Service: Brian [name] 06/26/2023 2:15 PM Medical Record Number: [id number] Patient Account Number: [id number] Date of Birth/Sex: Treating RN: 1932-10-16 (89 y.o. Brian Brian [name] Primary Care Jamison Soward: Nadene Rubins Other Clinician: Referring Raymie Giammarco: Treating Elhadji Pecore/Extender: [name] in Treatment: 71 Brian Status Brian Number: 3 Primary Skin Tear Etiology: Brian Location: Left Gluteus Brian Status: Open Wounding Event: Shear/Friction Comorbid Cataracts, Arrhythmia, Coronary Artery Disease, Date Acquired: 06/18/2023 History: Hypotension, Osteoarthritis Weeks Of Treatment: 0 Clustered Brian: No Photos Brian Measurements Length: (cm) 7 Width: (cm) 1.5 Depth: (cm) 0.1 Area: (cm) 8.247 Volume:  (cm) 0.825 % Reduction in Area: % Reduction in Volume: Epithelialization: None Tunneling: No Undermining: No Brian Description Classification: Full Thickness Without Exposed Support Structures Brian Margin: Distinct, outline attached Exudate Amount: GIOMAR, GUSLER ([id number]) Exudate Type: Serosanguineous Exudate Color: red, brown Foul Odor After Cleansing: No Slough/Fibrino Yes [phone].pdf Page 9 of 11 Brian Bed Granulation Amount: Large (67-100%) Exposed Structure Granulation Quality: Red Fascia Exposed: No Necrotic Amount: Small (1-33%) Fat Layer (Subcutaneous Tissue) Exposed: Yes Necrotic Quality: Eschar, Adherent Slough Tendon Exposed: No Muscle Exposed: No Joint Exposed: No Bone Exposed: No Periwound Skin Texture Texture Color No Abnormalities Noted: No No Abnormalities Noted: No Rash: Yes Rubor: Yes Moisture Temperature / Pain No Abnormalities Noted: No Temperature: No Abnormality Dry / Scaly: Yes Treatment Notes Brian #3 (Gluteus) Brian Laterality: Left Cleanser Soap and Water Discharge Instruction: May shower and wash Brian with dial antibacterial soap and water prior to dressing change. Brian Cleanser Discharge Instruction: Cleanse the Brian with Brian cleanser prior to applying a clean dressing using gauze sponges, not tissue or cotton balls. Peri-Brian Care Topical Primary Dressing Maxorb Extra Ag+ Alginate Dressing, 2x2 (in/in)  Discharge Instruction: Apply to Brian bed as instructed Secondary Dressing Zetuvit Plus Silicone Border Dressing 5x5 (in/in) Discharge Instruction: Apply silicone border over primary dressing as directed. Secured With Compression Wrap Compression Stockings Facilities manager) Signed: 06/26/2023 3:30:49 PM By: Samuella Bruin Entered By: Samuella Bruin on 06/26/2023 11:21:55 -------------------------------------------------------------------------------- Brian Assessment  Details Patient Name: Date of Service: Brian [name] 06/26/2023 2:15 PM Medical Record Number: [id number] Patient Account Number: [id number] Date of Birth/Sex: Treating RN: [birth year] (89 y.o. Brian Brian [name] Primary Care Aldo Sondgeroth: Nadene Rubins Other Clinician: Referring Breann Losano: Treating Catheline Hixon/Extender: [name] in Treatment: 82 Brian Status Brian Number: 4 Primary Abrasion Etiology: Brian Location: Penis Brian Status: Open Wounding Event: Shear/Friction Brian [name], Brian [name] ([id number]) [id number] Page 10 of 11 Comorbid Cataracts, Arrhythmia, Coronary Artery Disease, Date Acquired: 06/26/2023 History: Hypotension, Osteoarthritis Weeks Of Treatment: 0 Clustered Brian: No Brian Measurements Length: (cm) 0.2 Width: (cm) 1 Depth: (cm) 0.1 Area: (cm) 0.157 Volume: (cm) 0.016 % Reduction in Area: % Reduction in Volume: Epithelialization: None Tunneling: No Undermining: No Brian Description Classification: Full Thickness Without Exposed Support Structures Brian Margin: Distinct, outline attached Exudate Amount: Medium Exudate Type: Serous Exudate Color: amber Foul Odor After Cleansing: No Slough/Fibrino Yes Brian Bed Granulation Amount: Small (1-33%) Exposed Structure Granulation Quality: Red, Pink Fascia Exposed: No Necrotic Amount: Large (67-100%) Fat Layer (Subcutaneous Tissue) Exposed: Yes Necrotic Quality: Adherent Slough Tendon Exposed: No Muscle Exposed: No Joint Exposed: No Bone Exposed: No Periwound Skin Texture Texture Color No Abnormalities Noted: Yes No Abnormalities Noted: Yes Moisture Temperature / Pain No Abnormalities Noted: Yes Temperature: No Abnormality Treatment Notes Brian #4 (Penis) Cleanser Soap and Water Discharge Instruction: May shower and wash Brian with dial antibacterial soap and water prior to dressing change. Brian Cleanser Discharge Instruction: Cleanse the  Brian with Brian cleanser prior to applying a clean dressing using gauze sponges, not tissue or cotton balls. Peri-Brian Care Topical Miconazole Nitrate Powder, 2%, 2.5 (oz) bottle Discharge Instruction: Apply in groin folds. Primary Dressing Maxorb Extra Ag+ Alginate Dressing, 2x2 (in/in) Discharge Instruction: Apply to Brian bed as instructed Secondary Dressing Secured With Compression Wrap Compression Stockings Add-Ons Electronic Signature(s) Signed: 06/26/2023 3:30:49 PM By: Samuella Bruin Entered By: Samuella Bruin on 06/26/2023 11:32:36 Brian [name], Brian [name] ([id number]) [id number] Page 11 of 11 -------------------------------------------------------------------------------- Vitals Details Patient Name: Date of Service: Brian [name] 06/26/2023 2:15 PM Medical Record Number: [id number] Patient Account Number: [id number] Date of Birth/Sex: Treating RN: [birth year] (89 y.o. Brian Brian [name] Primary Care Ayona Yniguez: Nadene Rubins Other Clinician: Referring Duff Pozzi: Treating Erian Lariviere/Extender: [name] in Treatment: 97 Vital Signs Time Taken: 14:13 Temperature (F): 97.7 Height (in): 66 Pulse (bpm): 77 Weight (lbs): 185 Respiratory Rate (breaths/min): 18 Body Mass Index (BMI): 29.9 Blood Pressure (mmHg): 116/64 Reference Range: 80 - 120 mg / dl Electronic Signature(s) Signed: 06/26/2023 3:30:49 PM By: Samuella Bruin Entered By: Samuella Bruin on 06/26/2023 11:15:14

## 2023-06-26 NOTE — Telephone Encounter (Signed)
Marylene Land from [place] [facility] stated that the patient [facility] doctor had placed an order for home health. Marylene Land wants to know if you will place an order. Please give Marylene Land a call. TY

## 2023-06-26 NOTE — Progress Notes (Signed)
Brian Brian [name], Brian Brian [name] ([id number]) [id number] Page 1 of 12 Visit Report for 06/26/2023 Chief Complaint Document Details Patient Name: Date of Service: Brian Brian [name] ID Brian [name] 06/26/2023 2:15 PM Medical Record Number: [id number] Patient Account Number: [id number] Date of Birth/Sex: Treating RN: Jan 11, 1933 (89 y.o. Brian [name]) Primary Care Provider: Nadene Rubins Other Clinician: Referring Provider: Treating Provider/Extender: [name] in Treatment: 45 Information Obtained from: Patient Chief Complaint 08/05/2019; patient is here for review of pressure ulcers on his buttock 02/14/22: patient is here for review of pressure ulcers on his left elbow Electronic Signature(s) Signed: 06/26/2023 3:11:53 PM By: Duanne Guess MD FACS Entered By: Duanne Guess on 06/26/2023 12:11:53 -------------------------------------------------------------------------------- Debridement Details Patient Name: Date of Service: Brian Brian [name] ID Brian [name] 06/26/2023 2:15 PM Medical Record Number: [id number] Patient Account Number: [id number] Date of Birth/Sex: Treating RN: 1933-01-10 (89 y.o. Brian Brian [name] Primary Care Provider: Nadene Rubins Other Clinician: Referring Provider: Treating Provider/Extender: [name] in Treatment: 71 Debridement Performed for Assessment: Wound #1 Left Elbow Performed By: Physician Duanne Guess, MD The following information was scribed by: Samuella Bruin The information was scribed for: Duanne Guess Debridement Type: Debridement Level of Consciousness (Pre-procedure): Awake and Alert Pre-procedure Verification/Time Out Yes - 14:29 Taken: Start Time: 14:29 Pain Control: Lidocaine 4% T opical Solution Percent of Wound Bed Debrided: 100% T Area Debrided (cm): otal 0.16 Tissue and other material debrided: Non-Viable, Slough, Subcutaneous, Slough Level: Skin/Subcutaneous Tissue Debridement  Description: Excisional Instrument: Curette Bleeding: Minimum Hemostasis Achieved: Pressure Response to Treatment: Procedure was tolerated well Level of Consciousness (Post- Awake and Alert procedure): Post Debridement Measurements of Total Wound Length: (cm) 0.5 Stage: Category/Stage III Width: (cm) 0.4 Depth: (cm) 0.2 Volume: (cm) 0.031 Character of Wound/Ulcer Post Debridement: Improved Brian Brian [name] ([id number]) [id number] Page 2 of 12 Post Procedure Diagnosis Same as Pre-procedure Electronic Signature(s) Signed: 06/26/2023 3:30:49 PM By: Samuella Bruin Signed: 06/26/2023 3:48:24 PM By: Duanne Guess MD FACS Entered By: Samuella Bruin on 06/26/2023 11:30:05 -------------------------------------------------------------------------------- Debridement Details Patient Name: Date of Service: Brian Brian [name] ID Brian [name] 06/26/2023 2:15 PM Medical Record Number: [id number] Patient Account Number: [id number] Date of Birth/Sex: Treating RN: 07-22-1933 (89 y.o. Brian Brian [name] Primary Care Provider: Nadene Rubins Other Clinician: Referring Provider: Treating Provider/Extender: [name] in Treatment: 71 Debridement Performed for Assessment: Wound #3 Left Gluteus Performed By: Physician Duanne Guess, MD The following information was scribed by: Samuella Bruin The information was scribed for: Duanne Guess Debridement Type: Debridement Level of Consciousness (Pre-procedure): Awake and Alert Pre-procedure Verification/Time Out Yes - 14:29 Taken: Start Time: 14:29 Pain Control: Lidocaine 4% T opical Solution Percent of Wound Bed Debrided: 100% T Area Debrided (cm): otal 8.24 Tissue and other material debrided: Non-Viable, Slough, Subcutaneous, Slough Level: Skin/Subcutaneous Tissue Debridement Description: Excisional Instrument: Curette Bleeding: Minimum Hemostasis Achieved: Pressure Response to  Treatment: Procedure was tolerated well Level of Consciousness (Post- Awake and Alert procedure): Post Debridement Measurements of Total Wound Length: (cm) 7 Width: (cm) 1.5 Depth: (cm) 0.1 Volume: (cm) 0.825 Character of Wound/Ulcer Post Debridement: Improved Post Procedure Diagnosis Same as Pre-procedure Electronic Signature(s) Signed: 06/26/2023 3:30:49 PM By: Samuella Bruin Signed: 06/26/2023 3:48:24 PM By: Duanne Guess MD FACS Entered By: Samuella Bruin on 06/26/2023 11:31:05 -------------------------------------------------------------------------------- HPI Details Patient Name: Date of Service: Brian Brian Brian [name] ID Brian [name] 06/26/2023 2:15 PM Medical Record Number: [id number] Patient Account Number: [id number] Brian Brian [name], Brian Brian [name] ([id number]) [id number] Page 3 of 12 Date of  Birth/Sex: Treating RN: 10-09-1932 (89 y.o. Brian [name]) Primary Care Provider: Other Clinician: Nadene Rubins Referring Provider: Treating Provider/Extender: [name] in Treatment: 49 History of Present Illness HPI Description: ADMISSION 08/05/19 This is an 89 year old man who arrives in clinic accompanied by his son. 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 Apparently noted recently to have a stage I pressure area on the left buttock. He does not have a wound history. He does have been air fluidized mattress. They have a wheelchair cushion as well as a pillow over the top of this. He is not incontinent of urine or bowel. Past medical history includes hypertension, osteoarthritis of the knee, atrial fibrillation, history of mitral valve repair, right hemisphere CVA with left hemiparesis, history of a DVT READMISSION 02/14/2022 The patient is here today for evaluation of pressure ulcers on his left elbow and possible right buttock ulcer. Apparently when he was seen by Dr. Leanord Hawking in 2020, no ulcer was appreciated on  the buttock and no further wound care involvement occurred. 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 He does have home health aides and they are turning him every 2 hours side to side. He is on a memory foam mattress at this time. 03/14/2022: The patient was scheduled to see me on July 11, but apparently his son panicked about some bright red blood per rectum and rushed him to the emergency room. There is also a comment in the electronic medical record that the patient's son thought the bone was exposed on his elbow. After waiting a prolonged period of time at the emergency department, they left without being seen. Today, it appears that the dressing on the patient's elbow was never changed since our last visit. The 2 wounds have converged creating a larger wound. According to the intake nurse, there was a foul odor from the wound and dressing. There is extensive slough on the wound surface. The bleeding per rectum turns out to be hemorrhoids. The patient's son expresses that he does not know how to manage this. 04/07/2022: For unclear reasons, the patient has not returned to clinic until they were contacted today and they made an add-on visit. The patient's son was not originally present at the beginning of the visit but showed up about 25 minutes into the visit. The patient was accompanied by a home aide. She reported that when she cleaned the patient up today, there was just a Band-Aid on his wound. She said the surface was fairly mucky but she was able to wash this off. It is abundantly clear that this wound is not being properly cared for. Apparently he did have [facility] home health but they have not been out in some time and it is not clear the reason for this. The surface of the wound is a bit cleaner so perhaps the Santyl has been applied, but unfortunately there is now a  deep tunnel that extends up from the elbow to the patient's posterior upper arm for a number centimeters. There is no evidence of infection or purulent drainage. It is also clear that the patient continues to spend a substantial portion of his time leaning on the elbow. 05/23/2022: Once again, the patient has failed to return to clinic for about 6 weeks and the reason remains unclear. He is apparently receiving home health assistance.  The wound on his elbow is covered with hypertrophic granulation tissue. He also has a blister just distal to the wound on his posterior forearm. He has a new stage III pressure ulcer on his left gluteus with slough accumulation. He apparently spends a substantial portion of his days sitting in a wheelchair. I do not believe he has a Museum/gallery conservator. Due to his stroke, he lists to the left side, which is how his ulcer on his elbow developed. I suspect the gluteal ulcer is as a result of this, as well. 06/13/2022: The wound on his left gluteus is nearly healed. It is very superficial and quite clean. His son reports that he has just been applying Desitin to the area. The elbow wound is smaller but still has some hypertrophic granulation tissue and slough accumulation. No concern for infection at either site. 06/27/2022: The buttock wounds are healed. There is some tissue maceration without any significant skin breakdown. The elbow wound continues to contract. There is still some hypertrophic granulation tissue and slough buildup. 07/25/2022: The buttock wounds have reopened. The periwound skin has been kept very dry through liberal use of zinc oxide, however. The elbow wound dressing did not look like it had been changed in at least a week when it was removed. The underlying wound, however has not reaccumulated any hypertrophic granulation tissue; there is a thin layer of slough present. No malodor or purulent drainage from the wound itself. 08/15/2022: There is a tiny opening  remaining on the buttocks. It is clean and there is no evidence of tissue maceration. The elbow looks very good. There is good beefy tissue present and it is flush with the surrounding skin. Home health has been coming out. 09/05/2022: The wound on his bottom is healed. The elbow shows signs of pressure induced deep tissue injury and also has some hypertrophic granulation tissue. 09/26/2022: The elbow looks much better today. There is a little bit of slough on the surface but there is no undermining or tunneling. He does have some hypertrophic granulation tissue accumulation. 10/17/2022: His elbow wound continues to improve. It is smaller, cleaner, and more superficial. There is hypertrophic granulation tissue present. 11/14/2022: His elbow ulcer is smaller again today. There is some slough on the surface. 12/19/2022: The pressure ulcer on his elbow is smaller again today. There is an area that looks like he may have bumped it on something, as it is a little bit bruised and purpleish. He is also requesting an order for physical therapy. 01/16/2023: The pressure ulcer on his elbow looks better than I have ever seen it. It is smaller and more superficial with very little slough on the surface. 02/13/2023: The wound is just the tiniest bit smaller today. There is a little slough on the surface. The tissue around the wound does look a bit purpleish, as though he has been allowed to lean on it, unfortunately. 03/20/2023: The wound measured smaller today. There is less evidence of pressure on the site compared to last month. There is some slough on the surface and the underlying granulation tissue is a bit hypertrophic. 04/17/2023: The wound was smaller again today. There is some tunneling at the more proximal aspect of the wound. There is hypertrophic granulation tissue present. He has been complaining of more pain and the periwound looks a little bit red. 05/08/2023: The wound continues to contract. No tunneling  is present at this point. The hypertrophic granulation tissue has reaccumulated, but not to the extent that was present  at his last visit. The erythema of the periwound has resolved is less tender. 05/29/2023: The wound is smaller by perhaps half a centimeter this visit. The hypertrophic granulation tissue has not reaccumulated. There is some slough on the wound surface. 06/26/2023: The elbow wound is smaller again today. There is minimal slough accumulation on the surface. He has had some shear-related breakdown on each of Brian Brian [name], Brian Brian [name] ([id number]) [id number] Page 4 of 12 his gluteal surfaces. Once side does expose the fat layer, but only just. The other is limited to breakdown of skin. In addition, he has some skin breakdown at the base of his penis where he has been requiring a condom catheter and it looks like there has been some slippage. He also has erythematous, yeasty skin in his groin folds. Electronic Signature(s) Signed: 06/26/2023 3:13:30 PM By: Duanne Guess MD FACS Entered By: Duanne Guess on 06/26/2023 12:13:30 -------------------------------------------------------------------------------- Physical Exam Details Patient Name: Date of Service: Brian Brian Brian Brian [name] ID Brian [name] 06/26/2023 2:15 PM Medical Record Number: [id number] Patient Account Number: [id number] Date of Birth/Sex: Treating RN: 1932-10-12 (89 y.o. Brian [name]) Primary Care Provider: Nadene Rubins Other Clinician: Referring Provider: Treating Provider/Extender: [name] in Treatment: 30 Constitutional . . . . no acute distress. Respiratory Normal work of breathing on room air.. Notes 06/26/2023: The elbow wound is smaller again today. There is minimal slough accumulation on the surface. He has had some shear-related breakdown on each of his gluteal surfaces. Once side does expose the fat layer, but only just. The other is limited to breakdown of skin. In addition, he  has some skin breakdown at the base of his penis where he has been requiring a condom catheter and it looks like there has been some slippage. He also has erythematous, yeasty skin in his groin folds. Electronic Signature(s) Signed: 06/26/2023 3:14:07 PM By: Duanne Guess MD FACS Entered By: Duanne Guess on 06/26/2023 12:14:06 -------------------------------------------------------------------------------- Physician Orders Details Patient Name: Date of Service: Brian Brian Brian Brian [name] ID Brian [name] 06/26/2023 2:15 PM Medical Record Number: [id number] Patient Account Number: [id number] Date of Birth/Sex: Treating RN: [birth year] (89 y.o. Brian Brian [name] Primary Care Provider: Nadene Rubins Other Clinician: Referring Provider: Treating Provider/Extender: [name] in Treatment: 75 The following information was scribed by: Samuella Bruin The information was scribed for: Duanne Guess Verbal / Phone Orders: No Diagnosis Coding Follow-up Appointments Return appointment in 1 month. - Dr. Lady Gary - room 2 Anesthetic (In clinic) Topical Lidocaine 4% applied to wound bed Bathing/ Shower/ Hygiene May shower and wash wound with soap and water. - with dressing changes Off-Loading Turn and reposition every 2 hours - stay off of left elbow wear prevalon boot on elbow as directed Brian Brian [name], Brian Brian [name] ([id number]) [id number] Page 5 of 12 Other: - limit time in wheelchair, reposition in wheelchair every 1-2 hours Home Health No change in wound care orders this week; continue Home Health for wound care. May utilize formulary equivalent dressing for wound treatment orders unless otherwise specified. - physical therapy once a week, wound care twice a week Other Home Health Orders/Instructions: - DGUYQIH Wound Treatment Wound #1 - Elbow Wound Laterality: Left Cleanser: Soap and Water Every Other Day/30 Days Discharge Instructions: May shower and  wash wound with dial antibacterial soap and water prior to dressing change. Cleanser: Wound Cleanser (Generic) Every Other Day/30 Days Discharge Instructions: Cleanse the wound with wound cleanser prior to applying a clean dressing using gauze sponges, not tissue or cotton balls.  Topical: Gentamicin Every Other Day/30 Days Discharge Instructions: As directed by physician Topical: Mupirocin Ointment Every Other Day/30 Days Discharge Instructions: Apply Mupirocin (Bactroban) as instructed Prim Dressing: Hydrofera Blue Ready Transfer Foam, 2.5x2.5 (in/in) Every Other Day/30 Days ary Discharge Instructions: Apply directly to wound bed as directed Secondary Dressing: ALLEVYN Gentle Border, 5x5 (in/in) (Generic) Every Other Day/30 Days Discharge Instructions: Apply over primary dressing as directed. Add-Ons: Cotton Tip Applicator, 6 (in) (Generic) Every Other Day/30 Days Wound #3 - Gluteus Wound Laterality: Left Cleanser: Soap and Water 1 x Per Day/30 Days Discharge Instructions: May shower and wash wound with dial antibacterial soap and water prior to dressing change. Cleanser: Wound Cleanser 1 x Per Day/30 Days Discharge Instructions: Cleanse the wound with wound cleanser prior to applying a clean dressing using gauze sponges, not tissue or cotton balls. Prim Dressing: Maxorb Extra Ag+ Alginate Dressing, 2x2 (in/in) 1 x Per Day/30 Days ary Discharge Instructions: Apply to wound bed as instructed Secondary Dressing: Zetuvit Plus Silicone Border Dressing 5x5 (in/in) 1 x Per Day/30 Days Discharge Instructions: Apply silicone border over primary dressing as directed. Wound #4 - Penis Cleanser: Soap and Water 1 x Per Day/30 Days Discharge Instructions: May shower and wash wound with dial antibacterial soap and water prior to dressing change. Cleanser: Wound Cleanser 1 x Per Day/30 Days Discharge Instructions: Cleanse the wound with wound cleanser prior to applying a clean dressing using gauze  sponges, not tissue or cotton balls. Topical: Miconazole Nitrate Powder, 2%, 2.5 (oz) bottle 1 x Per Day/30 Days Discharge Instructions: Apply in groin folds. Prim Dressing: Maxorb Extra Ag+ Alginate Dressing, 2x2 (in/in) 1 x Per Day/30 Days ary Discharge Instructions: Apply to wound bed as instructed Patient Medications llergies: penicillin, Novocain A Notifications Medication Indication Start End 06/26/2023 lidocaine DOSE topical 4 % cream - cream topical Electronic Signature(s) Signed: 06/26/2023 3:48:24 PM By: Duanne Guess MD FACS Entered By: Duanne Guess on 06/26/2023 12:16:40 Janco, Brian Brian [name] ([id number]) [id number] Page 6 of 12 -------------------------------------------------------------------------------- Problem List Details Patient Name: Date of Service: Brian Brian [name] ID Brian [name] 06/26/2023 2:15 PM Medical Record Number: [id number] Patient Account Number: [id number] Date of Birth/Sex: Treating RN: Dec 26, 1932 (89 y.o. 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 06/26/2023 No Yes L98.491 Non-pressure chronic ulcer of skin of other sites limited to breakdown of skin 06/26/2023 No Yes L30.4 Erythema intertrigo 06/26/2023 No Yes 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 02/14/2022 No Yes I48.19 Other persistent atrial fibrillation 02/14/2022 No Yes I25.10 Atherosclerotic heart disease of native coronary artery without angina pectoris 02/14/2022 No Yes Inactive Problems ICD-10 Code Description Active  Date Inactive Date L89.323 Pressure ulcer of left buttock, stage 3 05/23/2022 05/23/2022 Resolved Problems Electronic Signature(s) Signed: 06/26/2023 3:11:26 PM By: Duanne Guess MD FACS Entered By: Duanne Guess on 06/26/2023 12:11:26 Brian Brian [name], Brian Brian [name] ([id number]) [id number] Page 7 of 12 -------------------------------------------------------------------------------- Progress Note Details Patient Name: Date of Service: Brian Brian [name] ID Brian [name] 06/26/2023 2:15 PM Medical Record Number: [id number] Patient Account Number: [id number] Date of Birth/Sex: Treating RN: [birth year] (89 y.o. Brian [name]) Primary Care Provider: Nadene Rubins Other Clinician: Referring Provider: Treating Provider/Extender: Duanne Guess  Brian Brian [name] in Treatment: 64 Subjective Chief Complaint Information obtained from Patient 08/05/2019; patient is here for review of pressure ulcers on his buttock 02/14/22: patient is here for review of pressure ulcers on his left elbow History of Present Illness (HPI) ADMISSION 08/05/19 This is an 89 year old man who arrives in clinic accompanied by his son. 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 Apparently noted recently to have a stage I pressure area on the left buttock. He does not have a wound history. He does have been air fluidized mattress. They have a wheelchair cushion as well as a pillow over the top of this. He is not incontinent of urine or bowel. Past medical history includes hypertension, osteoarthritis of the knee, atrial fibrillation, history of mitral valve repair, right hemisphere CVA with left hemiparesis, history of a DVT READMISSION 02/14/2022 The patient is here today for evaluation of pressure ulcers on his left elbow and possible right buttock ulcer. Apparently when he was seen by Dr. Leanord Hawking in 2020, no ulcer was appreciated on the buttock and no further wound care involvement occurred.  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 He does have home health aides and they are turning him every 2 hours side to side. He is on a memory foam mattress at this time. 03/14/2022: The patient was scheduled to see me on July 11, but apparently his son panicked about some bright red blood per rectum and rushed him to the emergency room. There is also a comment in the electronic medical record that the patient's son thought the bone was exposed on his elbow. After waiting a prolonged period of time at the emergency department, they left without being seen. Today, it appears that the dressing on the patient's elbow was never changed since our last visit. The 2 wounds have converged creating a larger wound. According to the intake nurse, there was a foul odor from the wound and dressing. There is extensive slough on the wound surface. The bleeding per rectum turns out to be hemorrhoids. The patient's son expresses that he does not know how to manage this. 04/07/2022: For unclear reasons, the patient has not returned to clinic until they were contacted today and they made an add-on visit. The patient's son was not originally present at the beginning of the visit but showed up about 25 minutes into the visit. The patient was accompanied by a home aide. She reported that when she cleaned the patient up today, there was just a Band-Aid on his wound. She said the surface was fairly mucky but she was able to wash this off. It is abundantly clear that this wound is not being properly cared for. Apparently he did have [facility] home health but they have not been out in some time and it is not clear the reason for this. The surface of the wound is a bit cleaner so perhaps the Santyl has been applied, but unfortunately there is now a deep tunnel that extends up from the elbow to the patient's  posterior upper arm for a number centimeters. There is no evidence of infection or purulent drainage. It is also clear that the patient continues to spend a substantial portion of his time leaning on the elbow. 05/23/2022: Once again, the patient has failed to return to clinic for about 6 weeks and  the reason remains unclear. He is apparently receiving home health assistance. The wound on his elbow is covered with hypertrophic granulation tissue. He also has a blister just distal to the wound on his posterior forearm. He has a new stage III pressure ulcer on his left gluteus with slough accumulation. He apparently spends a substantial portion of his days sitting in a wheelchair. I do not believe he has a Museum/gallery conservator. Due to his stroke, he lists to the left side, which is how his ulcer on his elbow developed. I suspect the gluteal ulcer is as a result of this, as well. 06/13/2022: The wound on his left gluteus is nearly healed. It is very superficial and quite clean. His son reports that he has just been applying Desitin to the area. The elbow wound is smaller but still has some hypertrophic granulation tissue and slough accumulation. No concern for infection at either site. 06/27/2022: The buttock wounds are healed. There is some tissue maceration without any significant skin breakdown. The elbow wound continues to contract. There is still some hypertrophic granulation tissue and slough buildup. 07/25/2022: The buttock wounds have reopened. The periwound skin has been kept very dry through liberal use of zinc oxide, however. The elbow wound dressing did not look like it had been changed in at least a week when it was removed. The underlying wound, however has not reaccumulated any hypertrophic granulation tissue; there is a thin layer of slough present. No malodor or purulent drainage from the wound itself. 08/15/2022: There is a tiny opening remaining on the buttocks. It is clean and there is no  evidence of tissue maceration. The elbow looks very good. There is good beefy tissue present and it is flush with the surrounding skin. Home health has been coming out. 09/05/2022: The wound on his bottom is healed. The elbow shows signs of pressure induced deep tissue injury and also has some hypertrophic granulation tissue. 09/26/2022: The elbow looks much better today. There is a little bit of slough on the surface but there is no undermining or tunneling. He does have some hypertrophic granulation tissue accumulation. 10/17/2022: His elbow wound continues to improve. It is smaller, cleaner, and more superficial. There is hypertrophic granulation tissue present. 11/14/2022: His elbow ulcer is smaller again today. There is some slough on the surface. 12/19/2022: The pressure ulcer on his elbow is smaller again today. There is an area that looks like he may have bumped it on something, as it is a little bit bruised and purpleish. He is also requesting an order for physical therapy. 01/16/2023: The pressure ulcer on his elbow looks better than I have ever seen it. It is smaller and more superficial with very little slough on the surface. Brian Brian [name], Brian Brian [name] ([id number]) [id number] Page 8 of 12 02/13/2023: The wound is just the tiniest bit smaller today. There is a little slough on the surface. The tissue around the wound does look a bit purpleish, as though he has been allowed to lean on it, unfortunately. 03/20/2023: The wound measured smaller today. There is less evidence of pressure on the site compared to last month. There is some slough on the surface and the underlying granulation tissue is a bit hypertrophic. 04/17/2023: The wound was smaller again today. There is some tunneling at the more proximal aspect of the wound. There is hypertrophic granulation tissue present. He has been complaining of more pain and the periwound looks a little bit red. 05/08/2023: The wound continues  to contract. No  tunneling is present at this point. The hypertrophic granulation tissue has reaccumulated, but not to the extent that was present at his last visit. The erythema of the periwound has resolved is less tender. 05/29/2023: The wound is smaller by perhaps half a centimeter this visit. The hypertrophic granulation tissue has not reaccumulated. There is some slough on the wound surface. 06/26/2023: The elbow wound is smaller again today. There is minimal slough accumulation on the surface. He has had some shear-related breakdown on each of his gluteal surfaces. Once side does expose the fat layer, but only just. The other is limited to breakdown of skin. In addition, he has some skin breakdown at the base of his penis where he has been requiring a condom catheter and it looks like there has been some slippage. He also has erythematous, yeasty skin in his groin folds. Patient History Information obtained from Patient. Family History Diabetes - Maternal Grandparents, Heart Disease - Mother, Hypertension - Mother, No family history of Cancer, Hereditary Spherocytosis, Kidney Disease, Lung Disease, Seizures, Stroke, Thyroid Problems, Tuberculosis. Social History Never smoker, Marital Status - Married, Alcohol Use - Never, Drug Use - No History, Caffeine Use - Never. Medical History Eyes Patient has history of Cataracts - removed Cardiovascular Patient has history of Arrhythmia - Atrial Flutter/Atrial Fib, Coronary Artery Disease, Hypotension Denies history of Hypertension Musculoskeletal Patient has history of Osteoarthritis Neurologic Denies history of Paraplegia Hospitalization/Surgery History - open heart surgery. - IR generic histoircal. - appendectomy. - hernia repair. - joint replacement. - mitral valve repair. - mvp repair. - total hip arthroplasty. Medical A Surgical History Notes nd Constitutional Symptoms (General Health) obseity Ear/Nose/Mouth/Throat wears hearing  aids Cardiovascular Mitral Valve Repair, Genitourinary BPH Neurologic CVA 2018, left side hemiparesis Objective Constitutional no acute distress. Vitals Time Taken: 2:13 PM, Height: 66 in, Weight: 185 lbs, BMI: 29.9, Temperature: 97.7 F, Pulse: 77 bpm, Respiratory Rate: 18 breaths/min, Blood Pressure: 116/64 mmHg. Respiratory Normal work of breathing on room air.. General Notes: 06/26/2023: The elbow wound is smaller again today. There is minimal slough accumulation on the surface. He has had some shear-related breakdown on each of his gluteal surfaces. Once side does expose the fat layer, but only just. The other is limited to breakdown of skin. In addition, he has some skin breakdown at the base of his penis where he has been requiring a condom catheter and it looks like there has been some slippage. He also has erythematous, yeasty skin in his groin folds. Integumentary (Hair, Skin) Wound #1 status is Open. Original cause of wound was Pressure Injury. The date acquired was: 12/19/2021. The wound has been in treatment 71 weeks. The wound is located on the Left Elbow. The wound measures 0.5cm length x 0.4cm width x 0.2cm depth; 0.157cm^2 area and 0.031cm^3 volume. There is Fat Layer (Subcutaneous Tissue) exposed. There is no tunneling or undermining noted. There is a medium amount of serosanguineous drainage noted. The wound margin is distinct with the outline attached to the wound base. There is small (1-33%) pink, pale granulation within the wound bed. There is a large (67-100%) Brian Brian [name], Brian Brian [name] ([id number]) [id number] Page 9 of 12 amount of necrotic tissue within the wound bed including Adherent Slough. The periwound skin appearance had no abnormalities noted for texture. The periwound skin appearance had no abnormalities noted for moisture. The periwound skin appearance had no abnormalities noted for color. Periwound temperature was noted as No Abnormality. The  periwound has tenderness on palpation. Wound #3 status is Open.  Original cause of wound was Shear/Friction. The date acquired was: 06/18/2023. The wound is located on the Left Gluteus. The wound measures 7cm length x 1.5cm width x 0.1cm depth; 8.247cm^2 area and 0.825cm^3 volume. There is Fat Layer (Subcutaneous Tissue) exposed. There is no tunneling or undermining noted. There is a medium amount of serosanguineous drainage noted. The wound margin is distinct with the outline attached to the wound base. There is large (67-100%) red granulation within the wound bed. There is a small (1-33%) amount of necrotic tissue within the wound bed including Eschar and Adherent Slough. The periwound skin appearance exhibited: Rash, Dry/Scaly, Rubor. Periwound temperature was noted as No Abnormality. Wound #4 status is Open. Original cause of wound was Shear/Friction. The date acquired was: 06/26/2023. The wound is located on the Penis. The wound measures 0.2cm length x 1cm width x 0.1cm depth; 0.157cm^2 area and 0.016cm^3 volume. There is Fat Layer (Subcutaneous Tissue) exposed. There is no tunneling or undermining noted. There is a medium amount of serous drainage noted. The wound margin is distinct with the outline attached to the wound base. There is small (1-33%) red, pink granulation within the wound bed. There is a large (67-100%) amount of necrotic tissue within the wound bed including Adherent Slough. The periwound skin appearance had no abnormalities noted for texture. The periwound skin appearance had no abnormalities noted for moisture. The periwound skin appearance had no abnormalities noted for color. Periwound temperature was noted as No Abnormality. 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 There was a Excisional Skin/Subcutaneous Tissue Debridement with a total area of 0.16 sq cm performed by Duanne Guess, MD. With the following instrument(s): Curette to remove Non-Viable tissue/material. Material removed includes Subcutaneous Tissue and Slough and after achieving pain control using Lidocaine 4% T opical Solution. No specimens were taken. A time out was conducted at 14:29, prior to the start of the procedure. A Minimum amount of bleeding was controlled with Pressure. The procedure was tolerated well. Post Debridement Measurements: 0.5cm length x 0.4cm width x 0.2cm depth; 0.031cm^3 volume. Post debridement Stage noted as Category/Stage III. Character of Wound/Ulcer Post Debridement is improved. Post procedure Diagnosis Wound #1: Same as Pre-Procedure Wound #3 Pre-procedure diagnosis of Wound #3 is a Skin T located on the Left Gluteus . There was a Excisional Skin/Subcutaneous Tissue Debridement with a total ear area of 8.24 sq cm performed by Duanne Guess, MD. With the following instrument(s): Curette to remove Non-Viable tissue/material. Material removed includes Subcutaneous Tissue and Slough and after achieving pain control using Lidocaine 4% T opical Solution. No specimens were taken. A time out was conducted at 14:29, prior to the start of the procedure. A Minimum amount of bleeding was controlled with Pressure. The procedure was tolerated well. Post Debridement Measurements: 7cm length x 1.5cm width x 0.1cm depth; 0.825cm^3 volume. Character of Wound/Ulcer Post Debridement is improved. Post procedure Diagnosis Wound #3: Same as  Pre-Procedure Plan Follow-up Appointments: Return appointment in 1 month. - Dr. Lady Gary - room 2 Anesthetic: (In clinic) Topical Lidocaine 4%  applied to wound bed Bathing/ Shower/ Hygiene: May shower and wash wound with soap and water. - with dressing changes Off-Loading: Turn and reposition every 2 hours - stay off of left elbow wear prevalon boot on elbow as directed Other: - limit time in wheelchair, reposition in wheelchair every 1-2 hours Home Health: No change in wound care orders this week; continue Home Health for wound care. May utilize formulary equivalent dressing for wound treatment orders unless otherwise specified. - physical therapy once a week, wound care twice a week Other Home Health Orders/Instructions: - [facility] The following medication(s) was prescribed: lidocaine topical 4 % cream cream topical was prescribed at facility WOUND #1: - Elbow Wound Laterality: Left Cleanser: Soap and Water Every Other Day/30 Days Discharge Instructions: May shower and wash wound with dial antibacterial soap and water prior to dressing change. Cleanser: Wound Cleanser (Generic) Every Other Day/30 Days Discharge Instructions: Cleanse the wound with wound cleanser prior to applying a clean dressing using gauze sponges, not tissue or cotton balls. Brian Brian [name], APPENZELLER ([id number]) [id number] Page 10 of 12 Topical: Gentamicin Every Other Day/30 Days Discharge Instructions: As directed by physician Topical: Mupirocin Ointment Every Other Day/30 Days Discharge Instructions: Apply Mupirocin (Bactroban) as instructed Prim Dressing: Hydrofera Blue Ready Transfer Foam, 2.5x2.5 (in/in) Every Other Day/30 Days ary Discharge Instructions: Apply directly to wound bed as directed Secondary Dressing: ALLEVYN Gentle Border, 5x5 (in/in) (Generic) Every Other Day/30 Days Discharge Instructions: Apply over primary dressing as directed. Add-Ons: Cotton Tip Applicator, 6 (in) (Generic)  Every Other Day/30 Days WOUND #3: - Gluteus Wound Laterality: Left Cleanser: Soap and Water 1 x Per Day/30 Days Discharge Instructions: May shower and wash wound with dial antibacterial soap and water prior to dressing change. Cleanser: Wound Cleanser 1 x Per Day/30 Days Discharge Instructions: Cleanse the wound with wound cleanser prior to applying a clean dressing using gauze sponges, not tissue or cotton balls. Prim Dressing: Maxorb Extra Ag+ Alginate Dressing, 2x2 (in/in) 1 x Per Day/30 Days ary Discharge Instructions: Apply to wound bed as instructed Secondary Dressing: Zetuvit Plus Silicone Border Dressing 5x5 (in/in) 1 x Per Day/30 Days Discharge Instructions: Apply silicone border over primary dressing as directed. WOUND #4: - Penis Wound Laterality: Cleanser: Soap and Water 1 x Per Day/30 Days Discharge Instructions: May shower and wash wound with dial antibacterial soap and water prior to dressing change. Cleanser: Wound Cleanser 1 x Per Day/30 Days Discharge Instructions: Cleanse the wound with wound cleanser prior to applying a clean dressing using gauze sponges, not tissue or cotton balls. Topical: Miconazole Nitrate Powder, 2%, 2.5 (oz) bottle 1 x Per Day/30 Days Discharge Instructions: Apply in groin folds. Prim Dressing: Maxorb Extra Ag+ Alginate Dressing, 2x2 (in/in) 1 x Per Day/30 Days ary Discharge Instructions: Apply to wound bed as instructed 06/26/2023: The elbow wound is smaller again today. There is minimal slough accumulation on the surface. He has had some shear-related breakdown on each of his gluteal surfaces. One side does expose the fat layer, but only just. The other is limited to breakdown of skin. In addition, he has some skin breakdown at the base of his penis where he has been requiring a condom catheter and it looks like there has been some slippage. He also has erythematous, yeasty skin in his groin folds. I used a curette to debride subcutaneous tissue  and slough from the left gluteal ulcer and the elbow wound. We will continue the mixture of topical gentamicin and mupirocin along with Hydrofera Blue to  the elbow. The abrasion at the base of the penis did not require any debridement. We will apply silver alginate here, as well as on both buttocks. I recommended miconazole/Nystatin powder to the intertriginous folds. Follow up, per son's request, in one month. Electronic Signature(s) Signed: 06/26/2023 3:18:55 PM By: Duanne Guess MD FACS Entered By: Duanne Guess on 06/26/2023 12:18:55 -------------------------------------------------------------------------------- HxROS Details Patient Name: Date of Service: Brian Brian Dorris Carnes, DA Brian [name] ID Brian [name] 06/26/2023 2:15 PM Medical Record Number: [id number] Patient Account Number: [id number] Date of Birth/Sex: Treating RN: 1933/04/15 (89 y.o. Brian [name]) Primary Care Provider: Nadene Rubins Other Clinician: Referring Provider: Treating Provider/Extender: [name] in Treatment: 42 Information Obtained From Patient Constitutional Symptoms (General Health) Medical History: Past Medical History Notes: obseity Eyes Medical History: Positive for: Cataracts - removed Ear/Nose/Mouth/Throat Medical History: Past Medical History Notes: wears hearing aids TYKWON, FERA ([id number]) [id number] Page 11 of 12 Cardiovascular Medical History: Positive for: Arrhythmia - Atrial Flutter/Atrial Fib; Coronary Artery Disease; Hypotension Negative for: Hypertension Past Medical History Notes: Mitral Valve Repair, Genitourinary Medical History: Past Medical History Notes: BPH Musculoskeletal Medical History: Positive for: Osteoarthritis Neurologic Medical History: Negative for: Paraplegia Past Medical History Notes: CVA 2018, left side hemiparesis HBO Extended History Items Eyes: Cataracts Immunizations Pneumococcal Vaccine: Received Pneumococcal  Vaccination: Yes Received Pneumococcal Vaccination On or After 60th Birthday: Yes Implantable Devices None Hospitalization / Surgery History Type of Hospitalization/Surgery open heart surgery IR generic histoircal appendectomy hernia repair joint replacement mitral valve repair mvp repair total hip arthroplasty Family and Social History Cancer: No; Diabetes: Yes - Maternal Grandparents; Heart Disease: Yes - Mother; Hereditary Spherocytosis: No; Hypertension: Yes - Mother; Kidney Disease: No; Lung Disease: No; Seizures: No; Stroke: No; Thyroid Problems: No; Tuberculosis: No; Never smoker; Marital Status - Married; Alcohol Use: Never; Drug Use: No History; Caffeine Use: Never; Financial Concerns: No; Food, Clothing or Shelter Needs: No; Support System Lacking: No; Transportation Concerns: No Electronic Signature(s) Signed: 06/26/2023 3:48:24 PM By: Duanne Guess MD FACS Entered By: Duanne Guess on 06/26/2023 12:13:42 -------------------------------------------------------------------------------- SuperBill Details Patient Name: Date of Service: Brian Brian Brian Brian [name] ID Brian [name] 06/26/2023 Medical Record Number: [id number] Patient Account Number: [id number] Date of Birth/Sex: Treating RN: Nov 26, 1932 (89 y.o. 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 L98.412 Non-pressure chronic ulcer of buttock with fat layer exposed L98.491 Non-pressure chronic ulcer of skin of other sites limited to breakdown of skin L30.4 Erythema intertrigo 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 I48.19 Other  persistent atrial fibrillation I25.10 Atherosclerotic heart disease of native coronary artery without angina pectoris Facility Procedures : CPT4 Code: [id number] Description: 11042 - DEB SUBQ TISSUE 20 SQ CM/< ICD-10 Diagnosis Description L89.023 Pressure ulcer of left elbow, stage 3 L98.411 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 L98.412 Non-pressure chronic ulcer of buttock with fat  layer exposed L98.491 Non-pressure chronic ulcer of skin of other sites limited to breakdown of ski Modifier: n Quantity: 1 Physician Procedures : CPT4 Code Description Modifier [id number] - WC PHYS LEVEL 4 - EST PT ICD-10 Diagnosis Description L89.023 Pressure ulcer of left elbow, stage 3 L98.411 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 L98.412 Non-pressure chronic  ulcer  of buttock with fat layer exposed L98.491 Non-pressure chronic ulcer of skin of other sites limited to breakdown of skin Quantity: 1 : [id number] 11042 - WC PHYS SUBQ TISS 20 SQ CM ICD-10 Diagnosis Description L89.023 Pressure ulcer of left elbow, stage 3 L98.411 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

## 2023-07-05 ENCOUNTER — Telehealth: Payer: Self-pay | Admitting: Family Medicine

## 2023-07-05 DIAGNOSIS — L89023 Pressure ulcer of left elbow, stage 3: Secondary | ICD-10-CM | POA: Diagnosis not present

## 2023-07-05 DIAGNOSIS — I119 Hypertensive heart disease without heart failure: Secondary | ICD-10-CM | POA: Diagnosis not present

## 2023-07-05 DIAGNOSIS — I4819 Other persistent atrial fibrillation: Secondary | ICD-10-CM | POA: Diagnosis not present

## 2023-07-05 DIAGNOSIS — Z7901 Long term (current) use of anticoagulants: Secondary | ICD-10-CM | POA: Diagnosis not present

## 2023-07-05 DIAGNOSIS — I69354 Hemiplegia and hemiparesis following cerebral infarction affecting left non-dominant side: Secondary | ICD-10-CM | POA: Diagnosis not present

## 2023-07-05 DIAGNOSIS — I2510 Atherosclerotic heart disease of native coronary artery without angina pectoris: Secondary | ICD-10-CM | POA: Diagnosis not present

## 2023-07-05 NOTE — Telephone Encounter (Signed)
[facility]  [place] [phone]  Pt told her he had just been prescribed an antibiotic. I did not see that in his chart so she wanted me to just let you know of his confusion.

## 2023-07-12 DIAGNOSIS — I2510 Atherosclerotic heart disease of native coronary artery without angina pectoris: Secondary | ICD-10-CM | POA: Diagnosis not present

## 2023-07-12 DIAGNOSIS — I119 Hypertensive heart disease without heart failure: Secondary | ICD-10-CM | POA: Diagnosis not present

## 2023-07-12 DIAGNOSIS — I69354 Hemiplegia and hemiparesis following cerebral infarction affecting left non-dominant side: Secondary | ICD-10-CM | POA: Diagnosis not present

## 2023-07-12 DIAGNOSIS — I4819 Other persistent atrial fibrillation: Secondary | ICD-10-CM | POA: Diagnosis not present

## 2023-07-12 DIAGNOSIS — Z7901 Long term (current) use of anticoagulants: Secondary | ICD-10-CM | POA: Diagnosis not present

## 2023-07-12 DIAGNOSIS — L89023 Pressure ulcer of left elbow, stage 3: Secondary | ICD-10-CM | POA: Diagnosis not present

## 2023-07-16 DIAGNOSIS — N3020 Other chronic cystitis without hematuria: Secondary | ICD-10-CM | POA: Diagnosis not present

## 2023-07-16 DIAGNOSIS — R300 Dysuria: Secondary | ICD-10-CM | POA: Diagnosis not present

## 2023-07-16 DIAGNOSIS — N481 Balanitis: Secondary | ICD-10-CM | POA: Diagnosis not present

## 2023-07-16 DIAGNOSIS — N401 Enlarged prostate with lower urinary tract symptoms: Secondary | ICD-10-CM | POA: Diagnosis not present

## 2023-07-16 DIAGNOSIS — R3914 Feeling of incomplete bladder emptying: Secondary | ICD-10-CM | POA: Diagnosis not present

## 2023-07-20 DIAGNOSIS — L89023 Pressure ulcer of left elbow, stage 3: Secondary | ICD-10-CM | POA: Diagnosis not present

## 2023-07-20 DIAGNOSIS — I69354 Hemiplegia and hemiparesis following cerebral infarction affecting left non-dominant side: Secondary | ICD-10-CM | POA: Diagnosis not present

## 2023-07-20 DIAGNOSIS — Z7901 Long term (current) use of anticoagulants: Secondary | ICD-10-CM | POA: Diagnosis not present

## 2023-07-20 DIAGNOSIS — I4819 Other persistent atrial fibrillation: Secondary | ICD-10-CM | POA: Diagnosis not present

## 2023-07-20 DIAGNOSIS — I119 Hypertensive heart disease without heart failure: Secondary | ICD-10-CM | POA: Diagnosis not present

## 2023-07-20 DIAGNOSIS — I2510 Atherosclerotic heart disease of native coronary artery without angina pectoris: Secondary | ICD-10-CM | POA: Diagnosis not present

## 2023-07-21 DIAGNOSIS — M179 Osteoarthritis of knee, unspecified: Secondary | ICD-10-CM | POA: Diagnosis not present

## 2023-07-21 DIAGNOSIS — L89322 Pressure ulcer of left buttock, stage 2: Secondary | ICD-10-CM | POA: Diagnosis not present

## 2023-07-21 DIAGNOSIS — Z952 Presence of prosthetic heart valve: Secondary | ICD-10-CM | POA: Diagnosis not present

## 2023-07-21 DIAGNOSIS — Z86718 Personal history of other venous thrombosis and embolism: Secondary | ICD-10-CM | POA: Diagnosis not present

## 2023-07-21 DIAGNOSIS — Z7901 Long term (current) use of anticoagulants: Secondary | ICD-10-CM | POA: Diagnosis not present

## 2023-07-21 DIAGNOSIS — I4819 Other persistent atrial fibrillation: Secondary | ICD-10-CM | POA: Diagnosis not present

## 2023-07-21 DIAGNOSIS — Z79891 Long term (current) use of opiate analgesic: Secondary | ICD-10-CM | POA: Diagnosis not present

## 2023-07-21 DIAGNOSIS — I2510 Atherosclerotic heart disease of native coronary artery without angina pectoris: Secondary | ICD-10-CM | POA: Diagnosis not present

## 2023-07-21 DIAGNOSIS — R1312 Dysphagia, oropharyngeal phase: Secondary | ICD-10-CM | POA: Diagnosis not present

## 2023-07-21 DIAGNOSIS — I119 Hypertensive heart disease without heart failure: Secondary | ICD-10-CM | POA: Diagnosis not present

## 2023-07-21 DIAGNOSIS — I69354 Hemiplegia and hemiparesis following cerebral infarction affecting left non-dominant side: Secondary | ICD-10-CM | POA: Diagnosis not present

## 2023-07-21 DIAGNOSIS — L89023 Pressure ulcer of left elbow, stage 3: Secondary | ICD-10-CM | POA: Diagnosis not present

## 2023-07-23 DIAGNOSIS — Z23 Encounter for immunization: Secondary | ICD-10-CM | POA: Diagnosis not present

## 2023-07-25 DIAGNOSIS — Z7901 Long term (current) use of anticoagulants: Secondary | ICD-10-CM | POA: Diagnosis not present

## 2023-07-25 DIAGNOSIS — I4819 Other persistent atrial fibrillation: Secondary | ICD-10-CM | POA: Diagnosis not present

## 2023-07-25 DIAGNOSIS — L89023 Pressure ulcer of left elbow, stage 3: Secondary | ICD-10-CM | POA: Diagnosis not present

## 2023-07-25 DIAGNOSIS — L89322 Pressure ulcer of left buttock, stage 2: Secondary | ICD-10-CM | POA: Diagnosis not present

## 2023-07-25 DIAGNOSIS — I2510 Atherosclerotic heart disease of native coronary artery without angina pectoris: Secondary | ICD-10-CM | POA: Diagnosis not present

## 2023-07-25 DIAGNOSIS — I69354 Hemiplegia and hemiparesis following cerebral infarction affecting left non-dominant side: Secondary | ICD-10-CM | POA: Diagnosis not present

## 2023-07-27 DIAGNOSIS — L89322 Pressure ulcer of left buttock, stage 2: Secondary | ICD-10-CM | POA: Diagnosis not present

## 2023-07-27 DIAGNOSIS — I2510 Atherosclerotic heart disease of native coronary artery without angina pectoris: Secondary | ICD-10-CM | POA: Diagnosis not present

## 2023-07-27 DIAGNOSIS — I4819 Other persistent atrial fibrillation: Secondary | ICD-10-CM | POA: Diagnosis not present

## 2023-07-27 DIAGNOSIS — I69354 Hemiplegia and hemiparesis following cerebral infarction affecting left non-dominant side: Secondary | ICD-10-CM | POA: Diagnosis not present

## 2023-07-27 DIAGNOSIS — L89023 Pressure ulcer of left elbow, stage 3: Secondary | ICD-10-CM | POA: Diagnosis not present

## 2023-07-27 DIAGNOSIS — Z7901 Long term (current) use of anticoagulants: Secondary | ICD-10-CM | POA: Diagnosis not present

## 2023-07-31 DIAGNOSIS — L89023 Pressure ulcer of left elbow, stage 3: Secondary | ICD-10-CM | POA: Diagnosis not present

## 2023-07-31 DIAGNOSIS — I4819 Other persistent atrial fibrillation: Secondary | ICD-10-CM | POA: Diagnosis not present

## 2023-07-31 DIAGNOSIS — I69354 Hemiplegia and hemiparesis following cerebral infarction affecting left non-dominant side: Secondary | ICD-10-CM | POA: Diagnosis not present

## 2023-07-31 DIAGNOSIS — Z7901 Long term (current) use of anticoagulants: Secondary | ICD-10-CM | POA: Diagnosis not present

## 2023-07-31 DIAGNOSIS — I2510 Atherosclerotic heart disease of native coronary artery without angina pectoris: Secondary | ICD-10-CM | POA: Diagnosis not present

## 2023-07-31 DIAGNOSIS — L89322 Pressure ulcer of left buttock, stage 2: Secondary | ICD-10-CM | POA: Diagnosis not present

## 2023-08-03 DIAGNOSIS — I2510 Atherosclerotic heart disease of native coronary artery without angina pectoris: Secondary | ICD-10-CM | POA: Diagnosis not present

## 2023-08-03 DIAGNOSIS — L89023 Pressure ulcer of left elbow, stage 3: Secondary | ICD-10-CM | POA: Diagnosis not present

## 2023-08-03 DIAGNOSIS — L89322 Pressure ulcer of left buttock, stage 2: Secondary | ICD-10-CM | POA: Diagnosis not present

## 2023-08-03 DIAGNOSIS — I69354 Hemiplegia and hemiparesis following cerebral infarction affecting left non-dominant side: Secondary | ICD-10-CM | POA: Diagnosis not present

## 2023-08-03 DIAGNOSIS — Z7901 Long term (current) use of anticoagulants: Secondary | ICD-10-CM | POA: Diagnosis not present

## 2023-08-03 DIAGNOSIS — I4819 Other persistent atrial fibrillation: Secondary | ICD-10-CM | POA: Diagnosis not present

## 2023-08-07 ENCOUNTER — Encounter (HOSPITAL_BASED_OUTPATIENT_CLINIC_OR_DEPARTMENT_OTHER): Payer: Medicare Other | Attending: General Surgery | Admitting: General Surgery

## 2023-08-07 DIAGNOSIS — L98491 Non-pressure chronic ulcer of skin of other sites limited to breakdown of skin: Secondary | ICD-10-CM | POA: Diagnosis not present

## 2023-08-07 DIAGNOSIS — L98412 Non-pressure chronic ulcer of buttock with fat layer exposed: Secondary | ICD-10-CM | POA: Diagnosis not present

## 2023-08-07 DIAGNOSIS — L89023 Pressure ulcer of left elbow, stage 3: Secondary | ICD-10-CM | POA: Insufficient documentation

## 2023-08-07 DIAGNOSIS — L304 Erythema intertrigo: Secondary | ICD-10-CM | POA: Diagnosis not present

## 2023-08-07 DIAGNOSIS — L98411 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: Secondary | ICD-10-CM | POA: Diagnosis not present

## 2023-08-07 DIAGNOSIS — Z993 Dependence on wheelchair: Secondary | ICD-10-CM | POA: Insufficient documentation

## 2023-08-07 DIAGNOSIS — Z86718 Personal history of other venous thrombosis and embolism: Secondary | ICD-10-CM | POA: Diagnosis not present

## 2023-08-07 DIAGNOSIS — I4819 Other persistent atrial fibrillation: Secondary | ICD-10-CM | POA: Diagnosis not present

## 2023-08-07 DIAGNOSIS — I2510 Atherosclerotic heart disease of native coronary artery without angina pectoris: Secondary | ICD-10-CM | POA: Insufficient documentation

## 2023-08-07 DIAGNOSIS — I69354 Hemiplegia and hemiparesis following cerebral infarction affecting left non-dominant side: Secondary | ICD-10-CM | POA: Insufficient documentation

## 2023-08-08 DIAGNOSIS — Z7901 Long term (current) use of anticoagulants: Secondary | ICD-10-CM | POA: Diagnosis not present

## 2023-08-08 DIAGNOSIS — L89023 Pressure ulcer of left elbow, stage 3: Secondary | ICD-10-CM | POA: Diagnosis not present

## 2023-08-08 DIAGNOSIS — I4819 Other persistent atrial fibrillation: Secondary | ICD-10-CM | POA: Diagnosis not present

## 2023-08-08 DIAGNOSIS — I69354 Hemiplegia and hemiparesis following cerebral infarction affecting left non-dominant side: Secondary | ICD-10-CM | POA: Diagnosis not present

## 2023-08-08 DIAGNOSIS — I2510 Atherosclerotic heart disease of native coronary artery without angina pectoris: Secondary | ICD-10-CM | POA: Diagnosis not present

## 2023-08-08 DIAGNOSIS — L89322 Pressure ulcer of left buttock, stage 2: Secondary | ICD-10-CM | POA: Diagnosis not present

## 2023-08-08 NOTE — Progress Notes (Signed)
ASHUR, Brian [name] ([id number]) [id number] Page 1 of 9 Visit Report for 08/07/2023 Arrival Information Details Patient Name: Date of Service: [name] 08/07/2023 2:15 PM Medical Record Number: [id number] Patient Account Number: [id number] Date of Birth/Sex: Treating RN: May 30, 1933 (89 y.o. [name]) Primary Care Brunilda Eble: Nadene Rubins Other Clinician: Referring Monike Bragdon: Treating Miraj Truss/Extender: [name] in Treatment: 57 Visit Information History Since Last Visit Added or deleted any medications: No Patient Arrived: Wheel Chair Any new allergies or adverse reactions: No Arrival Time: 14:32 Had a fall or experienced change in No Accompanied By: son activities of daily living that may affect Transfer Assistance: None risk of falls: Patient Identification Verified: Yes Signs or symptoms of abuse/neglect since last visito No Secondary Verification Process Completed: Yes Hospitalized since last visit: No Patient Requires Transmission-Based Precautions: No Implantable device outside of the clinic excluding No Patient Has Alerts: Yes cellular tissue based products placed in the center Patient Alerts: Patient on Blood Thinner since last visit: Pain Present Now: No Electronic Signature(s) Signed: 08/07/2023 4:20:12 PM By: Dayton Scrape Entered By: Dayton Scrape on 08/07/2023 14:33:07 -------------------------------------------------------------------------------- Encounter Discharge Information Details Patient Name: Date of Service: [name] 08/07/2023 2:15 PM Medical Record Number: [id number] Patient Account Number: [id number] Date of Birth/Sex: Treating RN: 1932-11-01 (89 y.o. Brian [name] Primary Care Sharifah Champine: Nadene Rubins Other Clinician: Referring Raheel Kunkle: Treating Yancy Hascall/Extender: [name] in Treatment: 80 Encounter Discharge Information Items Post Procedure  Vitals Discharge Condition: Stable Temperature (F): 98 Ambulatory Status: Wheelchair Pulse (bpm): 86 Discharge Destination: Home Respiratory Rate (breaths/min): 18 Transportation: Private Auto Blood Pressure (mmHg): 123/79 Accompanied By: son Schedule Follow-up Appointment: Yes Clinical Summary of Care: Patient Declined Electronic Signature(s) Signed: 08/07/2023 6:24:52 PM By: Karie Schwalbe RN Entered By: Karie Schwalbe on 08/07/2023 18:04:30 Brian [name], Brian [name] ([id number]) [id number] Page 2 of 9 -------------------------------------------------------------------------------- Lower Extremity Assessment Details Patient Name: Date of Service: [name] 08/07/2023 2:15 PM Medical Record Number: [id number] Patient Account Number: [id number] Date of Birth/Sex: Treating RN: November 02, 1932 (89 y.o. Brian [name] Primary Care Ellasyn Swilling: Nadene Rubins Other Clinician: Referring Aryeh Butterfield: Treating Sophie Tamez/Extender: [name] in Treatment: 11 Electronic Signature(s) Signed: 08/07/2023 6:24:52 PM By: Karie Schwalbe RN Entered By: Karie Schwalbe on 08/07/2023 14:42:44 -------------------------------------------------------------------------------- Multi Wound Chart Details Patient Name: Date of Service: [name] 08/07/2023 2:15 PM Medical Record Number: [id number] Patient Account Number: [id number] Date of Birth/Sex: Treating RN: 1933-01-23 (89 y.o. [name]) Primary Care Satine Hausner: Nadene Rubins Other Clinician: Referring Yarel Kilcrease: Treating Brunetta Newingham/Extender: [name] in Treatment: 77 Vital Signs Height(in): 66 Pulse(bpm): 86 Weight(lbs): 185 Blood Pressure(mmHg): 123/79 Body Mass Index(BMI): 29.9 Temperature(F): 98.0 Respiratory Rate(breaths/min): 18 [1:Photos:] Left Elbow Left Gluteus Penis Wound Location: Pressure Injury Shear/Friction Shear/Friction Wounding  Event: Pressure Ulcer Skin Tear Abrasion Primary Etiology: Cataracts, Arrhythmia, Coronary Cataracts, Arrhythmia, Coronary Cataracts, Arrhythmia, Coronary Comorbid History: Artery Disease, Hypotension, Artery Disease, Hypotension, Artery Disease, Hypotension, Osteoarthritis Osteoarthritis Osteoarthritis 12/19/2021 06/18/2023 06/26/2023 Date Acquired: 77 6 6 Weeks of Treatment: Open Open Open Wound Status: No No No Wound Recurrence: Yes No No Clustered Wound: 1.1x0.9x0.3 0.5x0.5x0.1 3x1.8x0.1 Measurements L x W x D (cm) 0.778 0.196 4.241 A (cm) : rea 0.233 0.02 0.424 Volume (cm) : 91.90% 97.60% -2601.30% % Reduction in Area: 75.80% 97.60% -2550.00% % Reduction in Volume: Category/Stage III Full Thickness Without Exposed Full Thickness Without Exposed Classification: Support Structures Support Structures Medium  Medium Medium Exudate Amount: Serosanguineous Serosanguineous Serous Exudate Type: red, brown red, brown amber Exudate Color: Distinct, outline attached Distinct, outline attached Distinct, outline attached Wound Margin: Brian [name] ([id number]) [id number] Page 3 of 9 Small (1-33%) Large (67-100%) Large (67-100%) Granulation Amount: Pink, Pale Red Red Granulation Quality: Large (67-100%) Small (1-33%) None Present (0%) Necrotic Amount: Adherent Slough Eschar, Adherent Slough N/A Necrotic Tissue: Fat Layer (Subcutaneous Tissue): Yes Fat Layer (Subcutaneous Tissue): Yes Fat Layer (Subcutaneous Tissue): Yes Exposed Structures: Fascia: No Fascia: No Fascia: No Tendon: No Tendon: No Tendon: No Muscle: No Muscle: No Muscle: No Joint: No Joint: No Joint: No Bone: No Bone: No Bone: No Medium (34-66%) None Small (1-33%) Epithelialization: No Abnormalities Noted Rash: Yes No Abnormalities Noted Periwound Skin Texture: No Abnormalities Noted Dry/Scaly: Yes No Abnormalities Noted Periwound Skin Moisture: No Abnormalities Noted  Rubor: Yes No Abnormalities Noted Periwound Skin Color: No Abnormality No Abnormality No Abnormality Temperature: Yes N/A N/A Tenderness on Palpation: Treatment Notes Electronic Signature(s) Signed: 08/07/2023 4:04:47 PM By: Duanne Guess MD FACS Entered By: Duanne Guess on 08/07/2023 14:51:10 -------------------------------------------------------------------------------- Multi-Disciplinary Care Plan Details Patient Name: Date of Service: [name] 08/07/2023 2:15 PM Medical Record Number: [id number] Patient Account Number: [id number] Date of Birth/Sex: Treating RN: 26-Mar-1933 (89 y.o. Brian [name] Primary Care Savannah Erbe: Nadene Rubins Other Clinician: Referring Sanad Fearnow: Treating Lanessa Shill/Extender: [name] in Treatment: 86 Multidisciplinary Care Plan reviewed with physician Active Inactive Abuse / Safety / Falls / Self Care Management Nursing Diagnoses: Impaired physical mobility Potential for falls Goals: Patient/caregiver will verbalize understanding of skin care regimen Date Initiated: 02/14/2022 Date Inactivated: 04/17/2023 Target Resolution Date: 04/21/2023 Goal Status: Met Patient/caregiver will verbalize/demonstrate measures taken to prevent injury and/or falls Date Initiated: 02/14/2022 Target Resolution Date: 09/21/2023 Goal Status: Active Interventions: Assess fall risk on admission and as needed Assess self care needs on admission and as needed Notes: Wound/Skin Impairment Nursing Diagnoses: Impaired tissue integrity Knowledge deficit related to ulceration/compromised skin integrity Goals: Patient/caregiver will verbalize understanding of skin care regimen Date Initiated: 02/14/2022 Target Resolution Date: 09/21/2023 Goal Status: Active Ulcer/skin breakdown will have a volume reduction of 30% by week 4 Brian [name] ([id number]) [id number] Page 4 of 9 Date Initiated:  02/14/2022 Date Inactivated: 05/23/2022 Target Resolution Date: 05/05/2022 Goal Status: Unmet Unmet Reason: new PU left glut Interventions: Assess patient/caregiver ability to obtain necessary supplies Assess patient/caregiver ability to perform ulcer/skin care regimen upon admission and as needed Assess ulceration(s) every visit Treatment Activities: Skin care regimen initiated : 02/14/2022 Topical wound management initiated : 02/14/2022 Notes: Electronic Signature(s) Signed: 08/07/2023 6:24:52 PM By: Karie Schwalbe RN Entered By: Karie Schwalbe on 08/07/2023 18:03:04 -------------------------------------------------------------------------------- Pain Assessment Details Patient Name: Date of Service: [name] 08/07/2023 2:15 PM Medical Record Number: [id number] Patient Account Number: [id number] Date of Birth/Sex: Treating RN: [birth year] (89 y.o. [name]) Primary Care Glennda Weatherholtz: Nadene Rubins Other Clinician: Referring Chananya Canizalez: Treating Domenique Southers/Extender: [name] in Treatment: 77 Active Problems Location of Pain Severity and Description of Pain Patient Has Paino No Site Locations Pain Management and Medication Current Pain Management: Electronic Signature(s) Signed: 08/07/2023 4:20:12 PM By: Dayton Scrape Entered By: Dayton Scrape on 08/07/2023 14:34:02 Ledford, Tavari [name] ([id number]) [id number] Page 5 of 9 -------------------------------------------------------------------------------- Patient/Caregiver Education Details Patient Name: Date of Service: [name] 12/17/2024andnbsp2:15 PM Medical Record Number: [id number] Patient Account Number: [id number] Date of Birth/Gender: Treating RN: 03-01-1933 (89 y.o. [name])  Karie Schwalbe Primary Care Physician: Nadene Rubins Other Clinician: Referring Physician: Treating Physician/Extender: [name] in Treatment: 45 Education  Assessment Education Provided To: Patient Education Topics Provided Wound/Skin Impairment: Methods: Explain/Verbal Responses: State content correctly Electronic Signature(s) Signed: 08/07/2023 6:24:52 PM By: Karie Schwalbe RN Entered By: Karie Schwalbe on 08/07/2023 18:03:18 -------------------------------------------------------------------------------- Wound Assessment Details Patient Name: Date of Service: [name] 08/07/2023 2:15 PM Medical Record Number: [id number] Patient Account Number: [id number] Date of Birth/Sex: Treating RN: 05/02/1933 (89 y.o. Brian [name] Primary Care Chace Klippel: Nadene Rubins Other Clinician: Referring Juanitta Earnhardt: Treating Shailen Thielen/Extender: [name] in Treatment: 77 Wound Status Wound Number: 1 Primary Pressure Ulcer Etiology: Wound Location: Left Elbow Wound Status: Open Wounding Event: Pressure Injury Comorbid Cataracts, Arrhythmia, Coronary Artery Disease, Date Acquired: 12/19/2021 History: Hypotension, Osteoarthritis Weeks Of Treatment: 77 Clustered Wound: Yes Photos Wound Measurements Length: (cm) 1.1 Width: (cm) 0.9 Depth: (cm) 0.3 Area: (cm) 0.778 Volume: (cm) 0.233 % Reduction in Area: 91.9% % Reduction in Volume: 75.8% Epithelialization: Medium (34-66%) Tunneling: No Undermining: No Wound Description Brian [name], Brian [name] ([id number]) Classification: Category/Stage III Wound Margin: Distinct, outline attached Exudate Amount: Medium Exudate Type: Serosanguineous Exudate Color: red, brown [id number] Page 6 of 9 Foul Odor After Cleansing: No Slough/Fibrino Yes Wound Bed Granulation Amount: Small (1-33%) Exposed Structure Granulation Quality: Pink, Pale Fascia Exposed: No Necrotic Amount: Large (67-100%) Fat Layer (Subcutaneous Tissue) Exposed: Yes Necrotic Quality: Adherent Slough Tendon Exposed: No Muscle Exposed: No Joint Exposed: No Bone Exposed:  No Periwound Skin Texture Texture Color No Abnormalities Noted: Yes No Abnormalities Noted: Yes Moisture Temperature / Pain No Abnormalities Noted: Yes Temperature: No Abnormality Tenderness on Palpation: Yes Treatment Notes Wound #1 (Elbow) Wound Laterality: Left Cleanser Soap and Water Discharge Instruction: May shower and wash wound with dial antibacterial soap and water prior to dressing change. Wound Cleanser Discharge Instruction: Cleanse the wound with wound cleanser prior to applying a clean dressing using gauze sponges, not tissue or cotton balls. Peri-Wound Care Topical Gentamicin Discharge Instruction: As directed by physician Mupirocin Ointment Discharge Instruction: Apply Mupirocin (Bactroban) as instructed Primary Dressing Hydrofera Blue Ready Transfer Foam, 2.5x2.5 (in/in) Discharge Instruction: Apply directly to wound bed as directed Secondary Dressing ALLEVYN Gentle Border, 5x5 (in/in) Discharge Instruction: Apply over primary dressing as directed. Secured With Compression Wrap Compression Stockings Add-Ons Engineer, maintenance, 6 (in) Electronic Signature(s) Signed: 08/07/2023 6:24:52 PM By: Karie Schwalbe RN Entered By: Karie Schwalbe on 08/07/2023 14:46:00 -------------------------------------------------------------------------------- Wound Assessment Details Patient Name: Date of Service: [name] 08/07/2023 2:15 PM Medical Record Number: [id number] Patient Account Number: [id number] Date of Birth/Sex: Treating RN: May 18, 1933 (89 y.o. Brian [name] Primary Care Rubee Vega: Nadene Rubins Other Clinician: Rueben [name] ([id number]) [id number] Page 7 of 9 Referring Mataeo Ingwersen: Treating Charbel Los/Extender: [name] in Treatment: 77 Wound Status Wound Number: 3 Primary Skin Tear Etiology: Wound Location: Left Gluteus Wound Status: Open Wounding Event:  Shear/Friction Comorbid Cataracts, Arrhythmia, Coronary Artery Disease, Date Acquired: 06/18/2023 History: Hypotension, Osteoarthritis Weeks Of Treatment: 6 Clustered Wound: No Photos Wound Measurements Length: (cm) 0.5 Width: (cm) 0.5 Depth: (cm) 0.1 Area: (cm) 0.196 Volume: (cm) 0.02 % Reduction in Area: 97.6% % Reduction in Volume: 97.6% Epithelialization: None Tunneling: No Undermining: No Wound Description Classification: Full Thickness Without Exposed Support Structures Wound Margin: Distinct, outline attached Exudate Amount: Medium Exudate Type: Serosanguineous Exudate Color: red, brown Foul Odor After Cleansing: No Slough/Fibrino Yes Wound Bed Granulation  Amount: Large (67-100%) Exposed Structure Granulation Quality: Red Fascia Exposed: No Necrotic Amount: Small (1-33%) Fat Layer (Subcutaneous Tissue) Exposed: Yes Necrotic Quality: Eschar, Adherent Slough Tendon Exposed: No Muscle Exposed: No Joint Exposed: No Bone Exposed: No Periwound Skin Texture Texture Color No Abnormalities Noted: No No Abnormalities Noted: No Rash: Yes Rubor: Yes Moisture Temperature / Pain No Abnormalities Noted: No Temperature: No Abnormality Dry / Scaly: Yes Treatment Notes Wound #3 (Gluteus) Wound Laterality: Left Cleanser Soap and Water Discharge Instruction: May shower and wash wound with dial antibacterial soap and water prior to dressing change. Wound Cleanser Discharge Instruction: Cleanse the wound with wound cleanser prior to applying a clean dressing using gauze sponges, not tissue or cotton balls. Peri-Wound Care Topical Primary Dressing Maxorb Extra Ag+ Alginate Dressing, 2x2 (in/in) Discharge Instruction: Apply to wound bed as instructed Brian [name], Brian [name] ([id number]) [id number] Page 8 of 9 Secondary Dressing Zetuvit Plus Silicone Border Dressing 5x5 (in/in) Discharge Instruction: Apply silicone border over primary dressing as  directed. Secured With Compression Wrap Compression Stockings Facilities manager) Signed: 08/07/2023 6:24:52 PM By: Karie Schwalbe RN Entered By: Karie Schwalbe on 08/07/2023 14:49:08 -------------------------------------------------------------------------------- Wound Assessment Details Patient Name: Date of Service: [name] 08/07/2023 2:15 PM Medical Record Number: [id number] Patient Account Number: [id number] Date of Birth/Sex: Treating RN: 02-03-1933 (89 y.o. Brian [name] Primary Care Mikiya Nebergall: Nadene Rubins Other Clinician: Referring Baylon Santelli: Treating Hosey Burmester/Extender: [name] in Treatment: 77 Wound Status Wound Number: 4 Primary Abrasion Etiology: Wound Location: Penis Wound Status: Open Wounding Event: Shear/Friction Comorbid Cataracts, Arrhythmia, Coronary Artery Disease, Date Acquired: 06/26/2023 History: Hypotension, Osteoarthritis Weeks Of Treatment: 6 Clustered Wound: No Photos Wound Measurements Length: (cm) 3 Width: (cm) 1.8 Depth: (cm) 0.1 Area: (cm) 4.241 Volume: (cm) 0.424 % Reduction in Area: -2601.3% % Reduction in Volume: -2550% Epithelialization: Small (1-33%) Tunneling: No Undermining: No Wound Description Classification: Full Thickness Without Exposed Suppor Wound Margin: Distinct, outline attached Exudate Amount: Medium Exudate Type: Serous Exudate Color: amber t Structures Foul Odor After Cleansing: No Slough/Fibrino Yes Wound Bed Granulation Amount: Large (67-100%) Exposed Structure Granulation Quality: Red Fascia Exposed: No Necrotic Amount: None Present (0%) Fat Layer (Subcutaneous Tissue) Exposed: Yes Brian [name] ([id number]) [id number] Page 9 of 9 Tendon Exposed: No Muscle Exposed: No Joint Exposed: No Bone Exposed: No Periwound Skin Texture Texture Color No Abnormalities Noted: Yes No Abnormalities Noted: Yes Moisture  Temperature / Pain No Abnormalities Noted: Yes Temperature: No Abnormality Treatment Notes Wound #4 (Penis) Cleanser Soap and Water Discharge Instruction: May shower and wash wound with dial antibacterial soap and water prior to dressing change. Wound Cleanser Discharge Instruction: Cleanse the wound with wound cleanser prior to applying a clean dressing using gauze sponges, not tissue or cotton balls. Peri-Wound Care Topical Miconazole Nitrate Powder, 2%, 2.5 (oz) bottle Discharge Instruction: Apply in groin folds. Primary Dressing Maxorb Extra Ag+ Alginate Dressing, 2x2 (in/in) Discharge Instruction: Apply to wound bed as instructed Secondary Dressing Secured With Compression Wrap Compression Stockings Add-Ons Electronic Signature(s) Signed: 08/07/2023 6:24:52 PM By: Karie Schwalbe RN Entered By: Karie Schwalbe on 08/07/2023 14:42:27 -------------------------------------------------------------------------------- Vitals Details Patient Name: Date of Service: [name] 08/07/2023 2:15 PM Medical Record Number: [id number] Patient Account Number: [id number] Date of Birth/Sex: Treating RN: April 12, 1933 (89 y.o. [name]) Primary Care Juni Glaab: Nadene Rubins Other Clinician: Referring Raechal Raben: Treating Josilynn Losh/Extender: [name] in Treatment: 77 Vital Signs Time Taken: 02:33 Temperature (F): 98.0 Height (in): 66  Pulse (bpm): 86 Weight (lbs): 185 Respiratory Rate (breaths/min): 18 Body Mass Index (BMI): 29.9 Blood Pressure (mmHg): 123/79 Reference Range: 80 - 120 mg / dl Electronic Signature(s) Signed: 08/07/2023 4:20:12 PM By: Dayton Scrape Entered By: Dayton Scrape on 08/07/2023 14:33:52

## 2023-08-08 NOTE — Progress Notes (Signed)
MOHSEN, HELMER Judie Petit ([id number]) [id number] Page 1 of 10 Visit Report for 08/07/2023 Chief Complaint Document Details Patient Name: Date of Service: [name] ID M. 08/07/2023 2:15 PM Medical Record Number: [id number] Patient Account Number: [id number] Date of Birth/Sex: Treating RN: 1933-02-01 (89 y.o. M) Primary Care Provider: Nadene Rubins Other Clinician: Referring Provider: Treating Provider/Extender: [name] in Treatment: 77 Information Obtained from: Patient Chief Complaint 08/05/2019; patient is here for review of pressure ulcers on his buttock 02/14/22: patient is here for review of pressure ulcers on his left elbow Electronic Signature(s) Signed: 08/07/2023 4:04:47 PM By: Duanne Guess MD FACS Entered By: Duanne Guess on 08/07/2023 15:05:23 -------------------------------------------------------------------------------- Debridement Details Patient Name: Date of Service: [name] ID M. 08/07/2023 2:15 PM Medical Record Number: [id number] Patient Account Number: [id number] Date of Birth/Sex: Treating RN: April 22, 1933 (89 y.o. [name] Primary Care Provider: Nadene Rubins Other Clinician: Referring Provider: Treating Provider/Extender: [name] in Treatment: 77 Debridement Performed for Assessment: Wound #1 Left Elbow Performed By: Physician Duanne Guess, MD The following information was scribed by: Karie Schwalbe The information was scribed for: Duanne Guess Debridement Type: Debridement Level of Consciousness (Pre-procedure): Awake and Alert Pre-procedure Verification/Time Out Yes - 14:55 Taken: Start Time: 14:55 Pain Control: Lidocaine 5% topical ointment Percent of Wound Bed Debrided: 100% T Area Debrided (cm): otal 0.78 Tissue and other material debrided: Viable, Non-Viable, Slough, Subcutaneous, Skin: Epidermis, Slough Level:  Skin/Subcutaneous Tissue Debridement Description: Excisional Instrument: Curette Bleeding: Minimum Hemostasis Achieved: Pressure End Time: 14:57 Procedural Pain: 0 Post Procedural Pain: 0 Response to Treatment: Procedure was tolerated well Level of Consciousness (Post- Awake and Alert procedure): Post Debridement Measurements of Total Wound Length: (cm) 1.1 Stage: Category/Stage III Width: (cm) 0.9 BRYSE, BILA ([id number]) [id number] Page 2 of 10 Depth: (cm) 0.3 Volume: (cm) 0.233 Character of Wound/Ulcer Post Debridement: Improved Post Procedure Diagnosis Same as Pre-procedure Electronic Signature(s) Signed: 08/07/2023 4:04:47 PM By: Duanne Guess MD FACS Signed: 08/07/2023 6:24:52 PM By: Karie Schwalbe RN Entered By: Karie Schwalbe on 08/07/2023 15:16:05 -------------------------------------------------------------------------------- HPI Details Patient Name: Date of Service: [name] ID M. 08/07/2023 2:15 PM Medical Record Number: [id number] Patient Account Number: [id number] Date of Birth/Sex: Treating RN: 1933/05/19 (89 y.o. M) Primary Care Provider: Nadene Rubins Other Clinician: Referring Provider: Treating Provider/Extender: [name] in Treatment: 64 History of Present Illness HPI Description: ADMISSION 08/05/19 This is an 89 year old man who arrives in clinic accompanied by his son. 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 Apparently noted recently to have a stage I pressure area on the left buttock. He does not have a wound history. He does have been air fluidized mattress. They have a wheelchair cushion as well as a pillow over the top of this. He is not incontinent of urine or bowel. Past medical history includes hypertension, osteoarthritis of the knee, atrial fibrillation, history of mitral valve repair, right hemisphere CVA with left hemiparesis,  history of a DVT READMISSION 02/14/2022 The patient is here today for evaluation of pressure ulcers on his left elbow and possible right buttock ulcer. Apparently when he was seen by Dr. Leanord Hawking in 2020, no ulcer was appreciated on the buttock and no further wound care involvement occurred. 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 He does have home health aides and they are turning him every 2 hours side to side. He is on a memory foam mattress at this time. 03/14/2022: The patient was scheduled to see me on July 11, but apparently his son panicked about some bright red blood per rectum and rushed him to the emergency room. There is also a comment in the electronic medical record that the patient's son thought the bone was exposed on his elbow. After waiting a prolonged period of time at the emergency department, they left without being seen. Today, it appears that the dressing on the patient's elbow was never changed since our last visit. The 2 wounds have converged creating a larger wound. According to the intake nurse, there was a foul odor from the wound and dressing. There is extensive slough on the wound surface. The bleeding per rectum turns out to be hemorrhoids. The patient's son expresses that he does not know how to manage this. 04/07/2022: For unclear reasons, the patient has not returned to clinic until they were contacted today and they made an add-on visit. The patient's son was not originally present at the beginning of the visit but showed up about 25 minutes into the visit. The patient was accompanied by a home aide. She reported that when she cleaned the patient up today, there was just a Band-Aid on his wound. She said the surface was fairly mucky but she was able to wash this off. It is abundantly clear that this wound is not being properly cared for.  Apparently he did have [facility] home health but they have not been out in some time and it is not clear the reason for this. The surface of the wound is a bit cleaner so perhaps the Santyl has been applied, but unfortunately there is now a deep tunnel that extends up from the elbow to the patient's posterior upper arm for a number centimeters. There is no evidence of infection or purulent drainage. It is also clear that the patient continues to spend a substantial portion of his time leaning on the elbow. 05/23/2022: Once again, the patient has failed to return to clinic for about 6 weeks and the reason remains unclear. He is apparently receiving home health assistance. The wound on his elbow is covered with hypertrophic granulation tissue. He also has a blister just distal to the wound on his posterior forearm. He has a new stage III pressure ulcer on his left gluteus with slough accumulation. He apparently spends a substantial portion of his days sitting in a wheelchair. I do not believe he has a Museum/gallery conservator. Due to his stroke, he lists to the left side, which is how his ulcer on his elbow developed. I suspect the gluteal ulcer is as a result of this, as well. 06/13/2022: The wound on his left gluteus is nearly healed. It is very superficial and quite clean. His son reports that he has just been applying Desitin to the area. The elbow wound is smaller but still has some hypertrophic granulation tissue and slough accumulation. No concern for infection at either site. 06/27/2022: The buttock wounds are healed. There is some tissue maceration without any significant skin breakdown. The elbow wound continues to contract. There is still some hypertrophic granulation tissue and slough buildup. 07/25/2022: The buttock wounds have reopened. The periwound skin has been kept very dry through liberal use of zinc oxide, however. The elbow wound dressing did not look like it had been changed in at  least a week when  it was removed. The underlying wound, however has not reaccumulated any hypertrophic granulation tissue; there is a thin layer of slough present. No malodor or purulent drainage from the wound itself. 08/15/2022: There is a tiny opening remaining on the buttocks. It is clean and there is no evidence of tissue maceration. The elbow looks very good. There is good beefy tissue present and it is flush with the surrounding skin. Home health has been coming out. CHAYANNE, STEG Judie Petit ([id number]) [id number] Page 3 of 10 09/05/2022: The wound on his bottom is healed. The elbow shows signs of pressure induced deep tissue injury and also has some hypertrophic granulation tissue. 09/26/2022: The elbow looks much better today. There is a little bit of slough on the surface but there is no undermining or tunneling. He does have some hypertrophic granulation tissue accumulation. 10/17/2022: His elbow wound continues to improve. It is smaller, cleaner, and more superficial. There is hypertrophic granulation tissue present. 11/14/2022: His elbow ulcer is smaller again today. There is some slough on the surface. 12/19/2022: The pressure ulcer on his elbow is smaller again today. There is an area that looks like he may have bumped it on something, as it is a little bit bruised and purpleish. He is also requesting an order for physical therapy. 01/16/2023: The pressure ulcer on his elbow looks better than I have ever seen it. It is smaller and more superficial with very little slough on the surface. 02/13/2023: The wound is just the tiniest bit smaller today. There is a little slough on the surface. The tissue around the wound does look a bit purpleish, as though he has been allowed to lean on it, unfortunately. 03/20/2023: The wound measured smaller today. There is less evidence of pressure on the site compared to last month. There is some slough on the surface and the underlying granulation tissue is a  bit hypertrophic. 04/17/2023: The wound was smaller again today. There is some tunneling at the more proximal aspect of the wound. There is hypertrophic granulation tissue present. He has been complaining of more pain and the periwound looks a little bit red. 05/08/2023: The wound continues to contract. No tunneling is present at this point. The hypertrophic granulation tissue has reaccumulated, but not to the extent that was present at his last visit. The erythema of the periwound has resolved is less tender. 05/29/2023: The wound is smaller by perhaps half a centimeter this visit. The hypertrophic granulation tissue has not reaccumulated. There is some slough on the wound surface. 06/26/2023: The elbow wound is smaller again today. There is minimal slough accumulation on the surface. He has had some shear-related breakdown on each of his gluteal surfaces. Once side does expose the fat layer, but only just. The other is limited to breakdown of skin. In addition, he has some skin breakdown at the base of his penis where he has been requiring a condom catheter and it looks like there has been some slippage. He also has erythematous, yeasty skin in his groin folds. 08/07/2023: The elbow wound continues to contract. There is some senescent skin around the edges and slough on the wound surface. The gluteus is essentially healed with just a little bit of peeling dry skin. The erythema intertrigo has resolved. The base of his penis is looking better, as well. There is just some mild excoriation. Electronic Signature(s) Signed: 08/07/2023 4:04:47 PM By: Duanne Guess MD FACS Entered By: Duanne Guess on 08/07/2023 15:06:18 -------------------------------------------------------------------------------- Physical Exam  Details Patient Name: Date of Service: [name] ID M. 08/07/2023 2:15 PM Medical Record Number: [id number] Patient Account Number: [id number] Date of Birth/Sex: Treating  RN: 03-11-1933 (89 y.o. M) Primary Care Provider: Nadene Rubins Other Clinician: Referring Provider: Treating Provider/Extender: [name] in Treatment: 43 Constitutional . . . . no acute distress. Respiratory Normal work of breathing on room air.. Notes 08/07/2023: The elbow wound continues to contract. There is some senescent skin around the edges and slough on the wound surface. The gluteus is essentially healed with just a little bit of peeling dry skin. The erythema intertrigo has resolved. The base of his penis is looking better, as well. There is just some mild excoriation. Electronic Signature(s) Signed: 08/07/2023 4:04:47 PM By: Duanne Guess MD FACS Entered By: Duanne Guess on 08/07/2023 15:08:38 Mcphie, Aeron M ([id number]) [id number] Page 4 of 10 -------------------------------------------------------------------------------- Physician Orders Details Patient Name: Date of Service: [name] ID M. 08/07/2023 2:15 PM Medical Record Number: [id number] Patient Account Number: [id number] Date of Birth/Sex: Treating RN: 12-15-1932 (89 y.o. 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 - Dr.  Lady Gary Anesthetic Wound #1 Left Elbow (In clinic) Topical Lidocaine 4% applied to wound bed Wound #3 Left Gluteus (In clinic) Topical Lidocaine 4% applied to wound bed Bathing/ Shower/ Hygiene May shower and wash wound with soap and water. - with dressing changes Off-Loading Turn and reposition every 2 hours - stay off of left elbow wear prevalon boot on elbow as directed Other: - limit time in wheelchair, reposition in wheelchair every 1-2 hours Home Health Wound #1 Left Elbow No change in wound care orders this week; continue Home Health for wound care. May utilize formulary equivalent dressing for wound treatment orders unless otherwise specified. - physical therapy once a week, wound care twice a week Other Home Health Orders/Instructions: - [facility] Wound #3 Left Gluteus No change in wound care orders this week; continue Home Health for wound care. May utilize formulary equivalent dressing for wound treatment orders unless otherwise specified. - physical therapy once a week, wound care twice a week Other Home Health Orders/Instructions: - [facility] Wound #4 Penis No change in wound care orders this week; continue Home Health for wound care. May utilize formulary equivalent dressing for wound treatment orders unless otherwise specified. - physical therapy once a week, wound care twice a week Other Home Health Orders/Instructions: - [id number] Wound Treatment Wound #1 - Elbow Wound Laterality: Left Cleanser: Soap and Water Every Other Day/30 Days Discharge Instructions: May shower and wash wound with dial antibacterial soap and water prior to dressing change. Cleanser: Wound Cleanser (Generic) Every Other Day/30 Days Discharge Instructions: Cleanse the wound with wound cleanser prior to applying a clean dressing using gauze sponges, not tissue or cotton balls. Topical: Gentamicin Every Other Day/30 Days Discharge Instructions: As directed by physician Topical: Mupirocin Ointment Every  Other Day/30 Days Discharge Instructions: Apply Mupirocin (Bactroban)  as instructed JAHKI, BUCHMEIER Z ([id number]) [id number] Page 5 of 10 Prim Dressing: Hydrofera Blue Ready Transfer Foam, 2.5x2.5 (in/in) Every Other Day/30 Days ary Discharge Instructions: Apply directly to wound bed as directed Secondary Dressing: ALLEVYN Gentle Border, 5x5 (in/in) (Generic) Every Other Day/30 Days Discharge Instructions: Apply over primary dressing as directed. Add-Ons: Cotton Tip Applicator, 6 (in) (Generic) Every Other Day/30 Days Wound #3 - Gluteus Wound Laterality: Left Cleanser: Soap and Water 1 x Per Day/30 Days Discharge Instructions: May shower and wash wound with dial antibacterial soap and water prior to dressing change. Cleanser: Wound Cleanser 1 x Per Day/30 Days Discharge Instructions: Cleanse the wound with wound cleanser prior to applying a clean dressing using gauze sponges, not tissue or cotton balls. Prim Dressing: Maxorb Extra Ag+ Alginate Dressing, 2x2 (in/in) 1 x Per Day/30 Days ary Discharge Instructions: Apply to wound bed as instructed Secondary Dressing: Zetuvit Plus Silicone Border Dressing 5x5 (in/in) 1 x Per Day/30 Days Discharge Instructions: Apply silicone border over primary dressing as directed. Wound #4 - Penis Cleanser: Soap and Water 1 x Per Day/30 Days Discharge Instructions: May shower and wash wound with dial antibacterial soap and water prior to dressing change. Cleanser: Wound Cleanser 1 x Per Day/30 Days Discharge Instructions: Cleanse the wound with wound cleanser prior to applying a clean dressing using gauze sponges, not tissue or cotton balls. Topical: Miconazole Nitrate Powder, 2%, 2.5 (oz) bottle 1 x Per Day/30 Days Discharge Instructions: Apply in groin folds. Prim Dressing: Maxorb Extra Ag+ Alginate Dressing, 2x2 (in/in) 1 x Per Day/30 Days ary Discharge Instructions: Apply to wound bed as instructed Electronic  Signature(s) Signed: 08/07/2023 6:24:52 PM By: Karie Schwalbe RN Signed: 08/08/2023 7:41:31 AM By: Duanne Guess MD FACS Previous Signature: 08/07/2023 4:04:47 PM Version By: Duanne Guess MD FACS Entered By: Karie Schwalbe on 08/07/2023 18:07:35 -------------------------------------------------------------------------------- Problem List Details Patient Name: Date of Service: [name] ID M. 08/07/2023 2:15 PM Medical Record Number: [id number] Patient Account Number: [id number] Date of Birth/Sex: Treating RN: 11-Sep-1932 (89 y.o. 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 06/26/2023 No Yes SRIYANSH, NORDMEYER ([id number]) [id number] Page 6 of 10 L98.491 Non-pressure chronic ulcer of skin of other sites limited to breakdown of skin 06/26/2023 No Yes L30.4 Erythema intertrigo 06/26/2023 No Yes 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 02/14/2022 No Yes I48.19 Other persistent atrial fibrillation 02/14/2022 No Yes I25.10 Atherosclerotic heart disease of native coronary artery without angina pectoris 02/14/2022 No Yes Inactive Problems ICD-10 Code Description Active Date Inactive Date L89.323 Pressure ulcer of left buttock, stage 3 05/23/2022 05/23/2022 Resolved Problems Electronic Signature(s) Signed: 08/07/2023 4:04:47 PM By: Duanne Guess MD FACS Entered By: Duanne Guess on 08/07/2023 14:50:49 -------------------------------------------------------------------------------- Progress Note  Details Patient Name: Date of Service: [name] ID M. 08/07/2023 2:15 PM Medical Record Number: [id number] Patient Account Number: [id number] Date of Birth/Sex: Treating RN: 06-Jan-1933 (89 y.o. M) Primary Care Provider: Nadene Rubins Other Clinician: Referring Provider: Treating Provider/Extender: [name] in Treatment: 69 Subjective Chief Complaint Information obtained from Patient 08/05/2019; patient is here for review of pressure ulcers on his buttock 02/14/22:  patient is here for review of pressure ulcers on his left elbow History of Present Illness (HPI) ADMISSION 08/05/19 This is an 89 year old man who arrives in clinic accompanied by his son. 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 Apparently noted recently to have a stage I pressure area on the left buttock. He does not have a wound history. He does have been air fluidized mattress. They have a wheelchair cushion as well as a pillow over the top of this. He is not incontinent of urine or bowel. Past medical history includes hypertension, osteoarthritis of the knee, atrial fibrillation, history of mitral valve repair, right hemisphere CVA with left hemiparesis, history of a DVT READMISSION 02/14/2022 The patient is here today for evaluation of pressure ulcers on his left elbow and possible right buttock ulcer. Apparently when he was seen by Dr. Leanord Hawking in 2020, no ulcer was appreciated on the buttock and no further wound care involvement occurred. 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 He does have home health aides and they are turning him every 2 hours side to side. He is on a memory foam mattress at this time. DEAUNTAE, BERNHEISEL Judie Petit ([id number]) [id number] Page 7 of 10 03/14/2022: The patient was scheduled  to see me on July 11, but apparently his son panicked about some bright red blood per rectum and rushed him to the emergency room. There is also a comment in the electronic medical record that the patient's son thought the bone was exposed on his elbow. After waiting a prolonged period of time at the emergency department, they left without being seen. Today, it appears that the dressing on the patient's elbow was never changed since our last visit. The 2 wounds have converged creating a larger wound. According to the intake nurse, there was a foul odor from the wound and dressing. There is extensive slough on the wound surface. The bleeding per rectum turns out to be hemorrhoids. The patient's son expresses that he does not know how to manage this. 04/07/2022: For unclear reasons, the patient has not returned to clinic until they were contacted today and they made an add-on visit. The patient's son was not originally present at the beginning of the visit but showed up about 25 minutes into the visit. The patient was accompanied by a home aide. She reported that when she cleaned the patient up today, there was just a Band-Aid on his wound. She said the surface was fairly mucky but she was able to wash this off. It is abundantly clear that this wound is not being properly cared for. Apparently he did have [facility] home health but they have not been out in some time and it is not clear the reason for this. The surface of the wound is a bit cleaner so perhaps the Santyl has been applied, but unfortunately there is now a deep tunnel that extends up from the elbow to the patient's posterior upper arm for a number centimeters. There is no evidence of infection or purulent drainage. It is also clear that the patient continues to spend a substantial portion of his time leaning on the elbow. 05/23/2022: Once again, the patient has failed to return to clinic for about 6 weeks and the reason remains unclear. He is  apparently receiving home health assistance. The wound on his elbow is  covered with hypertrophic granulation tissue. He also has a blister just distal to the wound on his posterior forearm. He has a new stage III pressure ulcer on his left gluteus with slough accumulation. He apparently spends a substantial portion of his days sitting in a wheelchair. I do not believe he has a Museum/gallery conservator. Due to his stroke, he lists to the left side, which is how his ulcer on his elbow developed. I suspect the gluteal ulcer is as a result of this, as well. 06/13/2022: The wound on his left gluteus is nearly healed. It is very superficial and quite clean. His son reports that he has just been applying Desitin to the area. The elbow wound is smaller but still has some hypertrophic granulation tissue and slough accumulation. No concern for infection at either site. 06/27/2022: The buttock wounds are healed. There is some tissue maceration without any significant skin breakdown. The elbow wound continues to contract. There is still some hypertrophic granulation tissue and slough buildup. 07/25/2022: The buttock wounds have reopened. The periwound skin has been kept very dry through liberal use of zinc oxide, however. The elbow wound dressing did not look like it had been changed in at least a week when it was removed. The underlying wound, however has not reaccumulated any hypertrophic granulation tissue; there is a thin layer of slough present. No malodor or purulent drainage from the wound itself. 08/15/2022: There is a tiny opening remaining on the buttocks. It is clean and there is no evidence of tissue maceration. The elbow looks very good. There is good beefy tissue present and it is flush with the surrounding skin. Home health has been coming out. 09/05/2022: The wound on his bottom is healed. The elbow shows signs of pressure induced deep tissue injury and also has some hypertrophic granulation tissue. 09/26/2022:  The elbow looks much better today. There is a little bit of slough on the surface but there is no undermining or tunneling. He does have some hypertrophic granulation tissue accumulation. 10/17/2022: His elbow wound continues to improve. It is smaller, cleaner, and more superficial. There is hypertrophic granulation tissue present. 11/14/2022: His elbow ulcer is smaller again today. There is some slough on the surface. 12/19/2022: The pressure ulcer on his elbow is smaller again today. There is an area that looks like he may have bumped it on something, as it is a little bit bruised and purpleish. He is also requesting an order for physical therapy. 01/16/2023: The pressure ulcer on his elbow looks better than I have ever seen it. It is smaller and more superficial with very little slough on the surface. 02/13/2023: The wound is just the tiniest bit smaller today. There is a little slough on the surface. The tissue around the wound does look a bit purpleish, as though he has been allowed to lean on it, unfortunately. 03/20/2023: The wound measured smaller today. There is less evidence of pressure on the site compared to last month. There is some slough on the surface and the underlying granulation tissue is a bit hypertrophic. 04/17/2023: The wound was smaller again today. There is some tunneling at the more proximal aspect of the wound. There is hypertrophic granulation tissue present. He has been complaining of more pain and the periwound looks a little bit red. 05/08/2023: The wound continues to contract. No tunneling is present at this point. The hypertrophic granulation tissue has reaccumulated, but not to the extent that was present at his last visit. The erythema of  the periwound has resolved is less tender. 05/29/2023: The wound is smaller by perhaps half a centimeter this visit. The hypertrophic granulation tissue has not reaccumulated. There is some slough on the wound surface. 06/26/2023: The elbow  wound is smaller again today. There is minimal slough accumulation on the surface. He has had some shear-related breakdown on each of his gluteal surfaces. Once side does expose the fat layer, but only just. The other is limited to breakdown of skin. In addition, he has some skin breakdown at the base of his penis where he has been requiring a condom catheter and it looks like there has been some slippage. He also has erythematous, yeasty skin in his groin folds. 08/07/2023: The elbow wound continues to contract. There is some senescent skin around the edges and slough on the wound surface. The gluteus is essentially healed with just a little bit of peeling dry skin. The erythema intertrigo has resolved. The base of his penis is looking better, as well. There is just some mild excoriation. Objective Constitutional no acute distress. Vitals Time Taken: 2:33 AM, Height: 66 in, Weight: 185 lbs, BMI: 29.9, Temperature: 98.0 F, Pulse: 86 bpm, Respiratory Rate: 18 breaths/min, Blood Pressure: 123/79 mmHg. Respiratory Normal work of breathing on room air.Marland Kitchen GERARD, SUDDITH Judie Petit ([id number]) [id number] Page 8 of 10 General Notes: 08/07/2023: The elbow wound continues to contract. There is some senescent skin around the edges and slough on the wound surface. The gluteus is essentially healed with just a little bit of peeling dry skin. The erythema intertrigo has resolved. The base of his penis is looking better, as well. There is just some mild excoriation. Integumentary (Hair, Skin) Wound #1 status is Open. Original cause of wound was Pressure Injury. The date acquired was: 12/19/2021. The wound has been in treatment 77 weeks. The wound is located on the Left Elbow. The wound measures 1.1cm length x 0.9cm width x 0.3cm depth; 0.778cm^2 area and 0.233cm^3 volume. There is Fat Layer (Subcutaneous Tissue) exposed. There is no tunneling or undermining noted. There is a medium amount of  serosanguineous drainage noted. The wound margin is distinct with the outline attached to the wound base. There is small (1-33%) pink, pale granulation within the wound bed. There is a large (67-100%) amount of necrotic tissue within the wound bed including Adherent Slough. The periwound skin appearance had no abnormalities noted for texture. The periwound skin appearance had no abnormalities noted for moisture. The periwound skin appearance had no abnormalities noted for color. Periwound temperature was noted as No Abnormality. The periwound has tenderness on palpation. Wound #3 status is Open. Original cause of wound was Shear/Friction. The date acquired was: 06/18/2023. The wound has been in treatment 6 weeks. The wound is located on the Left Gluteus. The wound measures 0.5cm length x 0.5cm width x 0.1cm depth; 0.196cm^2 area and 0.02cm^3 volume. There is Fat Layer (Subcutaneous Tissue) exposed. There is no tunneling or undermining noted. There is a medium amount of serosanguineous drainage noted. The wound margin is distinct with the outline attached to the wound base. There is large (67-100%) red granulation within the wound bed. There is a small (1-33%) amount of necrotic tissue within the wound bed including Eschar and Adherent Slough. The periwound skin appearance exhibited: Rash, Dry/Scaly, Rubor. Periwound temperature was noted as No Abnormality. Wound #4 status is Open. Original cause of wound was Shear/Friction. The date acquired was: 06/26/2023. The wound has been in treatment 6 weeks. The wound is located on the  Penis. The wound measures 3cm length x 1.8cm width x 0.1cm depth; 4.241cm^2 area and 0.424cm^3 volume. There is Fat Layer (Subcutaneous Tissue) exposed. There is no tunneling or undermining noted. There is a medium amount of serous drainage noted. The wound margin is distinct with the outline attached to the wound base. There is large (67-100%) red granulation within the wound bed.  There is no necrotic tissue within the wound bed. The periwound skin appearance had no abnormalities noted for texture. The periwound skin appearance had no abnormalities noted for moisture. The periwound skin appearance had no abnormalities noted for color. Periwound temperature was noted as No Abnormality. 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 There was a Excisional Skin/Subcutaneous Tissue Debridement with a total area of 0.78 sq cm performed by Duanne Guess, MD. With the following instrument(s): Curette to remove Viable and Non-Viable tissue/material. Material removed includes Subcutaneous Tissue, Slough, and Skin: Epidermis after achieving pain control using Lidocaine 5% topical ointment. A time out was conducted at 14:55, prior to the start of the procedure. A Minimum amount of bleeding was controlled with Pressure. The procedure was tolerated well with a pain level of 0 throughout and a pain level of 0 following the procedure. Post Debridement Measurements: 1.1cm length x 0.9cm width x 0.3cm depth; 0.233cm^3 volume. Post debridement Stage noted as Category/Stage III. Character of Wound/Ulcer Post Debridement is improved. Post procedure Diagnosis Wound #1: Same as Pre-Procedure Plan Follow-up Appointments: Return appointment in 1 month. - Dr. Lady Gary Anesthetic: (In clinic) Topical Lidocaine 4% applied to wound bed Bathing/ Shower/  Hygiene: May shower and wash wound with soap and water. - with dressing changes Off-Loading: Turn and reposition every 2 hours - stay off of left elbow wear prevalon boot on elbow as directed Other: - limit time in wheelchair, reposition in wheelchair every 1-2 hours Home Health: Wound #1 Left Elbow: No change in wound care orders this week; continue Home Health for wound care. May utilize formulary equivalent dressing for wound treatment orders unless otherwise specified. - physical therapy once a week, wound care twice a week Other Home Health Orders/Instructions: - WUXLKGM WOUND #1: - Elbow Wound Laterality: Left Cleanser: Soap and Water Every Other Day/30 Days Discharge Instructions: May shower and wash wound with dial antibacterial soap and water prior to dressing change. Cleanser: Wound Cleanser (Generic) Every Other Day/30 Days ESAIAH, DELBENE ([id number]) [id number] Page 9 of 10 Discharge Instructions: Cleanse the wound with wound cleanser prior to applying a clean dressing using gauze sponges, not tissue or cotton balls. Topical: Gentamicin Every Other Day/30 Days Discharge Instructions: As directed by physician Topical: Mupirocin Ointment Every Other Day/30 Days Discharge Instructions: Apply Mupirocin (Bactroban) as instructed Prim Dressing: Hydrofera Blue Ready Transfer Foam, 2.5x2.5 (in/in) Every Other Day/30 Days ary Discharge Instructions: Apply directly to wound bed as directed Secondary Dressing: ALLEVYN Gentle Border, 5x5 (in/in) (Generic) Every Other Day/30 Days Discharge Instructions: Apply over primary dressing as directed. Add-Ons: Cotton Tip Applicator, 6 (in) (Generic) Every Other Day/30 Days WOUND #3: - Gluteus Wound Laterality:  Left Cleanser: Soap and Water 1 x Per Day/30 Days Discharge Instructions: May shower and wash wound with dial antibacterial soap and water prior to dressing change. Cleanser: Wound Cleanser 1 x Per Day/30  Days Discharge Instructions: Cleanse the wound with wound cleanser prior to applying a clean dressing using gauze sponges, not tissue or cotton balls. Prim Dressing: Maxorb Extra Ag+ Alginate Dressing, 2x2 (in/in) 1 x Per Day/30 Days ary Discharge Instructions: Apply to wound bed as instructed Secondary Dressing: Zetuvit Plus Silicone Border Dressing 5x5 (in/in) 1 x Per Day/30 Days Discharge Instructions: Apply silicone border over primary dressing as directed. WOUND #4: - Penis Wound Laterality: Cleanser: Soap and Water 1 x Per Day/30 Days Discharge Instructions: May shower and wash wound with dial antibacterial soap and water prior to dressing change. Cleanser: Wound Cleanser 1 x Per Day/30 Days Discharge Instructions: Cleanse the wound with wound cleanser prior to applying a clean dressing using gauze sponges, not tissue or cotton balls. Topical: Miconazole Nitrate Powder, 2%, 2.5 (oz) bottle 1 x Per Day/30 Days Discharge Instructions: Apply in groin folds. Prim Dressing: Maxorb Extra Ag+ Alginate Dressing, 2x2 (in/in) 1 x Per Day/30 Days ary Discharge Instructions: Apply to wound bed as instructed 08/07/2023: The elbow wound continues to contract. There is some senescent skin around the edges and slough on the wound surface. The gluteus is essentially healed with just a little bit of peeling dry skin. The erythema intertrigo has resolved. The base of his penis is looking better, as well. There is just some mild excoriation. I used a curette to debride slough, senescent skin, and subcutaneous tissue from the elbow wound. I did the gluteus nor the base of the penis required any debridement today. We will continue with topical gentamicin and mupirocin on the elbow to control and suppress normal skin flora, along with Hydrofera Blue. Continue silver alginate to the base of the penis and the gluteus. They should continue to use the nystatin powder in his intertriginous folds to avoid recurrence  of the cutaneous yeast. Follow-up in about 1 month. Electronic Signature(s) Signed: 08/07/2023 4:04:47 PM By: Duanne Guess MD FACS Entered By: Duanne Guess on 08/07/2023 15:13:10 -------------------------------------------------------------------------------- SuperBill Details Patient Name: Date of Service: [name] ID M. 08/07/2023 Medical Record Number: [id number] Patient Account Number: [id number] Date of Birth/Sex: Treating RN: 09-26-1932 (89 y.o. 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 L98.412 Non-pressure chronic ulcer of buttock with fat layer exposed L98.491 Non-pressure chronic ulcer of skin of other sites limited to breakdown of skin L30.4 Erythema intertrigo 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 I48.19 Other persistent atrial fibrillation I25.10 Atherosclerotic heart disease of native coronary artery without angina pectoris Facility Procedures : TAG, WOITAS CodeTeena Irani ([id number]) [id number] ICD Description: [id number] - DEB SUBQ TISSUE 20 SQ CM/< -10 Diagnosis Description L89.023 Pressure ulcer of left elbow, stage 3 Modifier: [id number] Page 1 Quantity: 10 of 10 Physician Procedures : CPT4 Code Description Modifier [id number] - WC PHYS LEVEL 4 - EST PT ICD-10 Diagnosis Description L89.023 Pressure ulcer of left elbow, stage 3 L98.411 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

## 2023-08-10 DIAGNOSIS — I10 Essential (primary) hypertension: Secondary | ICD-10-CM | POA: Diagnosis not present

## 2023-08-10 DIAGNOSIS — I4819 Other persistent atrial fibrillation: Secondary | ICD-10-CM | POA: Diagnosis not present

## 2023-08-10 DIAGNOSIS — I7401 Saddle embolus of abdominal aorta: Secondary | ICD-10-CM | POA: Diagnosis not present

## 2023-08-10 DIAGNOSIS — L89023 Pressure ulcer of left elbow, stage 3: Secondary | ICD-10-CM | POA: Diagnosis not present

## 2023-08-10 DIAGNOSIS — I69354 Hemiplegia and hemiparesis following cerebral infarction affecting left non-dominant side: Secondary | ICD-10-CM | POA: Diagnosis not present

## 2023-08-10 DIAGNOSIS — Z7901 Long term (current) use of anticoagulants: Secondary | ICD-10-CM | POA: Diagnosis not present

## 2023-08-10 DIAGNOSIS — E785 Hyperlipidemia, unspecified: Secondary | ICD-10-CM | POA: Diagnosis not present

## 2023-08-10 DIAGNOSIS — L89322 Pressure ulcer of left buttock, stage 2: Secondary | ICD-10-CM | POA: Diagnosis not present

## 2023-08-10 DIAGNOSIS — I2510 Atherosclerotic heart disease of native coronary artery without angina pectoris: Secondary | ICD-10-CM | POA: Diagnosis not present

## 2023-08-16 DIAGNOSIS — I4819 Other persistent atrial fibrillation: Secondary | ICD-10-CM | POA: Diagnosis not present

## 2023-08-16 DIAGNOSIS — L89023 Pressure ulcer of left elbow, stage 3: Secondary | ICD-10-CM | POA: Diagnosis not present

## 2023-08-16 DIAGNOSIS — L89322 Pressure ulcer of left buttock, stage 2: Secondary | ICD-10-CM | POA: Diagnosis not present

## 2023-08-16 DIAGNOSIS — I69354 Hemiplegia and hemiparesis following cerebral infarction affecting left non-dominant side: Secondary | ICD-10-CM | POA: Diagnosis not present

## 2023-08-16 DIAGNOSIS — Z7901 Long term (current) use of anticoagulants: Secondary | ICD-10-CM | POA: Diagnosis not present

## 2023-08-16 DIAGNOSIS — I2510 Atherosclerotic heart disease of native coronary artery without angina pectoris: Secondary | ICD-10-CM | POA: Diagnosis not present

## 2023-08-18 ENCOUNTER — Other Ambulatory Visit: Payer: Self-pay | Admitting: Family Medicine

## 2023-08-18 DIAGNOSIS — K219 Gastro-esophageal reflux disease without esophagitis: Secondary | ICD-10-CM

## 2023-08-21 ENCOUNTER — Other Ambulatory Visit: Payer: Self-pay | Admitting: Family Medicine

## 2023-08-21 DIAGNOSIS — N401 Enlarged prostate with lower urinary tract symptoms: Secondary | ICD-10-CM

## 2023-09-04 ENCOUNTER — Encounter: Payer: Self-pay | Admitting: Student in an Organized Health Care Education/Training Program

## 2023-09-04 ENCOUNTER — Ambulatory Visit
Payer: Medicare Other | Attending: Student in an Organized Health Care Education/Training Program | Admitting: Student in an Organized Health Care Education/Training Program

## 2023-09-04 VITALS — BP 114/74 | HR 91 | Temp 98.1°F | Resp 16 | Ht 66.0 in | Wt 190.0 lb

## 2023-09-04 DIAGNOSIS — M5416 Radiculopathy, lumbar region: Secondary | ICD-10-CM | POA: Diagnosis present

## 2023-09-04 DIAGNOSIS — M47816 Spondylosis without myelopathy or radiculopathy, lumbar region: Secondary | ICD-10-CM | POA: Insufficient documentation

## 2023-09-04 DIAGNOSIS — M4726 Other spondylosis with radiculopathy, lumbar region: Secondary | ICD-10-CM | POA: Diagnosis not present

## 2023-09-04 DIAGNOSIS — G894 Chronic pain syndrome: Secondary | ICD-10-CM | POA: Insufficient documentation

## 2023-09-04 DIAGNOSIS — G8929 Other chronic pain: Secondary | ICD-10-CM | POA: Diagnosis present

## 2023-09-04 DIAGNOSIS — M5136 Other intervertebral disc degeneration, lumbar region with discogenic back pain only: Secondary | ICD-10-CM | POA: Insufficient documentation

## 2023-09-04 MED ORDER — OXYCODONE-ACETAMINOPHEN 7.5-325 MG PO TABS
1.0000 | ORAL_TABLET | Freq: Two times a day (BID) | ORAL | 0 refills | Status: AC | PRN
Start: 2023-10-04 — End: 2023-11-03

## 2023-09-04 MED ORDER — OXYCODONE-ACETAMINOPHEN 7.5-325 MG PO TABS
1.0000 | ORAL_TABLET | Freq: Two times a day (BID) | ORAL | 0 refills | Status: AC | PRN
Start: 2023-09-04 — End: 2023-10-04

## 2023-09-04 MED ORDER — OXYCODONE-ACETAMINOPHEN 7.5-325 MG PO TABS
1.0000 | ORAL_TABLET | Freq: Two times a day (BID) | ORAL | 0 refills | Status: DC | PRN
Start: 2023-11-03 — End: 2023-11-27

## 2023-09-04 MED ORDER — PREGABALIN 25 MG PO CAPS
25.0000 mg | ORAL_CAPSULE | Freq: Every day | ORAL | 5 refills | Status: DC
Start: 2023-09-04 — End: 2023-10-04

## 2023-09-04 NOTE — Progress Notes (Signed)
 PROVIDER NOTE: Information contained herein reflects review and annotations entered in association with encounter. Interpretation of such information and data should be left to medically-trained personnel. Information provided to patient can be located elsewhere in the medical record under Patient Instructions. Document created using STT-dictation technology, any transcriptional errors that may result from process are unintentional.    Patient: Brian [name]  Service Category: E/M  Provider: Wallie Sherry, MD  DOB: 1932-11-29  DOS: 09/04/2023  Referring Provider: Berneta Elsie [name] DEWAINE  MRN: [id number]  Specialty: Interventional Pain Management  PCP: Brian Elsie Sim, MD  Type: Established Patient  Setting: Ambulatory outpatient    Location: Office  Delivery: Face-to-face     HPI  [name], a 89 y.o. year old male, is here today because of his Lumbar spondylosis [M47.816]. [name] primary complain today is Shoulder Pain (Bilateral ) and Elbow Pain (Left ) Last encounter: My last encounter with him was on 06/04/24 Pertinent problems: Brian [name] has Dysarthria, post-stroke; Chronic radicular lumbar pain; Gait disturbance; Paraplegic immobility syndrome; History of cerebrovascular accident; Depression with anxiety; Lumbar spondylosis; Lumbar degenerative disc disease; Bilateral primary osteoarthritis of knee; Pain management contract signed; and Chronic pain syndrome on their pertinent problem list. Pain Assessment: Severity of Chronic pain is reported as a 5 /10. Location: Shoulder (left shoulder) Right, Left/denies. Onset: More than a month ago. Quality: Other (Comment) (arthritis pain in the shoulders and aching in the elbow.  states he has penis pain that burns and they are using lotrimin  cream for that.  he is currently using a condom catheter). Timing: Constant. Modifying factor(s):  SABRA Vitals:  height is 5' 6 (1.676 m) and weight is 190 lb (86.2 kg). His temporal temperature is  98.1 F (36.7 C). His blood pressure is 114/74 and his pulse is 91. His respiration is 16 and oxygen saturation is 100%.   Reason for encounter: medication management.   No change in medical history since last visit.  Patient's pain is at baseline.  Patient continues multimodal pain regimen as prescribed.  States that it provides pain relief and improvement in functional status.   04/11/22: 2nd pt visit No significant change in his medical history.  Patient unable to submit a urine toxicology screen so we obtained a serum drug screen. We will have patient sign pain contract.  He was accompanied today by his son and their CMA Prescription for Percocet as below 7.5 mg twice daily as needed which was being managed by their primary care provider, Dr. Berneta Also recommend restarting Lyrica  which the patient was on [name], 25 mg nightly as a pain adjunct.   HPI from initial clinic visit: [name] is a pleasant 89 year old male who is accompanied today by his son and his nursing aide.  He has a longstanding history of diffuse arthritis most pronounced in his glenohumeral joints and knees.  He has tried physical therapy and continues to engage in participate in physical therapy twice a week.  He has a history of stroke with associated hemiparesis.  He has significant thoracic kyphosis.  He has difficulty ambulating and presents today in a wheelchair.  He also has bilateral hip osteoarthritis.  He was previously on Percocet 5 mg twice a day which became less effective and as a result, his primary care provider increased it to 7.5 mg twice a day which he is not getting any benefit from.  He is currently anticoagulated on Eliquis  for his coronary artery disease.  He is also on  Lyrica  25 mg twice a day.  He is also on Seroquel  and Zoloft  for depression management.    Pharmacotherapy Assessment  Analgesic:  Percocet 7.5 mg BID PRN Lyrica  25 mg QHS  Monitoring: [place] PMP: PDMP reviewed during this  encounter.       Pharmacotherapy: No side-effects or adverse reactions reported. Compliance: No problems identified. Effectiveness: Clinically acceptable.  Jakie Chrissie MATSU, RN  09/04/2023  3:45 PM  Sign when Signing Visit Nursing Pain Medication Assessment:  Safety precautions to be maintained throughout the outpatient stay will include: orient to surroundings, keep bed in low position, maintain call bell within reach at all times, provide assistance with transfer out of bed and ambulation.  Medication Inspection Compliance: [name] did not comply with our request to bring his pills to be counted. He was reminded that bringing the medication bottles, even when empty, is a requirement.  Medication: None brought in. Pill/Patch Count: None available to be counted. Bottle Appearance: No container available. Did not bring bottle(s) to appointment. Filled Date: N/A Last Medication intake:  Today    No results found for: CBDTHCR No results found for: D8THCCBX No results found for: D9THCCBX  UDS:  Summary  Date Value Ref Range Status  12/27/2021 Note  Final    Comment:    ==================================================================== Compliance Drug Analysis, Ur ==================================================================== Test                             Result       Flag       Units  Drug Present and Declared for Prescription Verification   Oxycodone                       2038         EXPECTED   ng/mg creat   Oxymorphone                    6930         EXPECTED   ng/mg creat   Noroxycodone                   746          EXPECTED   ng/mg creat   Noroxymorphone                 488          EXPECTED   ng/mg creat    Sources of oxycodone  are scheduled prescription medications.    Oxymorphone, noroxycodone, and noroxymorphone are expected    metabolites of oxycodone . Oxymorphone is also available as a    scheduled prescription medication.    Pregabalin                       PRESENT      EXPECTED   Sertraline                      PRESENT      EXPECTED   Desmethylsertraline            PRESENT      EXPECTED    Desmethylsertraline is an expected metabolite of sertraline .    Quetiapine                      PRESENT      EXPECTED   Acetaminophen   PRESENT      EXPECTED  Drug Absent but Declared for Prescription Verification   Alprazolam                      Not Detected UNEXPECTED ng/mg creat   Metoprolol                      Not Detected UNEXPECTED ==================================================================== Test                      Result    Flag   Units      Ref Range   Creatinine              100              mg/dL      >=79 ==================================================================== Declared Medications:  The flagging and interpretation on this report are based on the  following declared medications.  Unexpected results may arise from  inaccuracies in the declared medications.   **Note: The testing scope of this panel includes these medications:   Alprazolam  (Xanax )  Metoprolol   Oxycodone  (Percocet)  Pregabalin  (Lyrica )  Quetiapine  (Seroquel )  Sertraline  (Zoloft )   **Note: The testing scope of this panel does not include small to  moderate amounts of these reported medications:   Acetaminophen  (Tylenol )  Acetaminophen  (Percocet)   **Note: The testing scope of this panel does not include the  following reported medications:   Apixaban  (Eliquis )  Cetirizine  (Zyrtec )  Finasteride  (Proscar )  Losartan  (Cozaar )  Multivitamin  Pantoprazole  (Protonix )  Polyethylene Glycol (MiraLAX )  Pravastatin   Tamsulosin  (Flomax ) ==================================================================== For clinical consultation, please call [phone]. ====================================================================       ROS  Constitutional: Denies any fever or chills Gastrointestinal: No reported hemesis,  hematochezia, vomiting, or acute GI distress Musculoskeletal:  Diffuse musculoskeletal pain Neurological:  Dysarthria and dysphagia from previous stroke  Medication Review  ALPRAZolam , Furosemide , Mupirocin, QUEtiapine , apixaban , cetirizine , clotrimazole , finasteride , fluticasone, furosemide , gentamicin ointment, hydrocortisone  ointment, melatonin, metoprolol  succinate, multivitamin, oxyCODONE -acetaminophen , pantoprazole , polyethylene glycol powder, pravastatin , pregabalin , sertraline , and tamsulosin   History Review  Allergy: [name] is allergic to novocain [procaine hcl], other, and penicillins. Drug: [name]  reports no history of drug use. Alcohol:  reports no history of alcohol use. Tobacco:  reports that he has quit smoking. He has never used smokeless tobacco. Social: [name]  reports that he has quit smoking. He has never used smokeless tobacco. He reports that he does not drink alcohol and does not use drugs. Medical:  has a past medical history of Anemia, Atrial flutter (HCC), BPH (benign prostatic hypertrophy), Epistaxis, Hemorrhage of gastrointestinal tract, unspecified, History of open heart surgery, Hypertension, Obesity (BMI 30.0-34.9) (06/29/2020), Osteoarthrosis, unspecified whether generalized or localized, unspecified site, Personal history of venous thrombosis and embolism, Rosacea, Stroke (HCC), and TIA (transient ischemic attack). Surgical: [name]  has a past surgical history that includes Appendectomy; Hernia repair; Total hip arthroplasty; Joint replacement; mvp repair; Mitral valve repair; Radiology with anesthesia (N/A, 05/04/2016); ir generic historical (05/04/2016); ir generic historical (05/04/2016); and ir generic historical (06/07/2016). Family: family history includes Coronary artery disease in his father; Rheumatic fever in his mother.  Laboratory Chemistry Profile   Renal Lab Results  Component Value Date   BUN 18 04/24/2023   CREATININE 0.68 (L)  04/24/2023   BCR 26 (H) 04/24/2023   GFR 84.36 11/01/2021   GFRAA >60 11/03/2018   GFRNONAA >60 04/11/2023    Hepatic Lab Results  Component Value  Date   AST 20 04/11/2023   ALT 15 04/11/2023   ALBUMIN 3.3 (L) 04/11/2023   ALKPHOS 61 04/11/2023   LIPASE 23 03/14/2022    Electrolytes Lab Results  Component Value Date   NA 141 04/24/2023   K 4.1 04/24/2023   CL 102 04/24/2023   CALCIUM  8.7 04/24/2023   MG 1.7 03/15/2017   PHOS 2.4 (L) 05/07/2016    Bone No results found for: VD25OH, CI874NY7UNU, CI6874NY7, CI7874NY7, 25OHVITD1, 25OHVITD2, 25OHVITD3, TESTOFREE, TESTOSTERONE  Inflammation (CRP: Acute Phase) (ESR: Chronic Phase) Lab Results  Component Value Date   LATICACIDVEN 1.9 03/15/2022         Note: Above Lab results reviewed.  Recent Imaging Review  CT Angio Chest PE W and/or Wo Contrast CLINICAL DATA:  Pulmonary embolism (PE) suspected, high prob. Intermittent shortness of breath. Pain to left flank. No known injury.  EXAM: CT ANGIOGRAPHY CHEST WITH CONTRAST  TECHNIQUE: Multidetector CT imaging of the chest was performed using the standard protocol during bolus administration of intravenous contrast. Multiplanar CT image reconstructions and MIPs were obtained to evaluate the vascular anatomy.  RADIATION DOSE REDUCTION: This exam was performed according to the departmental dose-optimization program which includes automated exposure control, adjustment of the mA and/or kV according to patient size and/or use of iterative reconstruction technique.  CONTRAST:  75mL OMNIPAQUE  IOHEXOL  350 MG/ML SOLN  COMPARISON:  CT angiography chest from 03/15/2022.  FINDINGS: Cardiovascular: No evidence of embolism to the proximal subsegmental pulmonary artery level.  Mild cardiomegaly. No pericardial effusion. No aortic aneurysm. There are minimal coronary artery and aortic artery atherosclerotic vascular calcifications. Prosthetic mitral valve  noted.  Mediastinum/Nodes: Visualized thyroid  gland appears grossly unremarkable. No solid / cystic mediastinal masses. The esophagus is nondistended precluding optimal assessment. No axillary, mediastinal or hilar lymphadenopathy by size criteria.  Lungs/Pleura: The central tracheo-bronchial tree is patent. There is small left and trace right pleural effusion. There are associated compressive atelectatic changes in the bilateral lower lobes. No mass, consolidation or pneumothorax. No suspicious lung nodules.  Upper Abdomen: Redemonstration of several hypoattenuating structures in the liver without significant interval change. The largest such structure is in the left hepatic lobe, segment 4B measuring up to 1.6 x 2.0 cm. Remaining visualized upper abdominal viscera within normal limits.  Musculoskeletal: Sternotomy wires noted. The visualized soft tissues of the chest wall are grossly unremarkable. No suspicious osseous lesions. There are mild multilevel degenerative changes in the visualized spine. A hemangioma is noted in the T3 vertebral body.  Review of the MIP images confirms the above findings.  IMPRESSION: 1. No evidence of pulmonary embolism. 2. Small left and trace right pleural effusions with associated compressive atelectatic changes in the bilateral lower lobes. 3. Multiple other nonacute observations, as described above.  Aortic Atherosclerosis (ICD10-I70.0).  Electronically Signed   By: Ree Molt M.D.   On: 04/11/2023 15:59 DG Chest Port 1 View CLINICAL DATA:  CP/SOB  EXAM: PORTABLE CHEST 1 VIEW  COMPARISON:  CXR 03/14/22  FINDINGS: Cardiomegaly. Small left pleural effusion. No pneumothorax. Status post median sternotomy. No focal airspace opacity. Prominent bilateral interstitial opacities could represent pulmonary venous congestion or atypical infection. No radiographically apparent displaced rib fractures. Visualized upper abdomen is  unremarkable.  IMPRESSION: 1. Cardiomegaly with small left pleural effusion. 2. Prominent bilateral interstitial opacities could represent pulmonary venous congestion or atypical infection.  Electronically Signed   By: Lyndall Gore M.D.   On: 04/11/2023 13:47 Note: Reviewed        Physical  Exam  General appearance: Well nourished, well developed, and well hydrated. In no apparent acute distress Mental status: Alert, oriented x 3 (person, place, & time)       Respiratory: No evidence of acute respiratory distress Eyes: PERLA Vitals: BP 114/74 (BP Location: Right Arm, Patient Position: Sitting, Cuff Size: Normal)   Pulse 91   Temp 98.1 F (36.7 C) (Temporal)   Resp 16   Ht 5' 6 (1.676 m)   Wt 190 lb (86.2 kg)   SpO2 100%   BMI 30.67 kg/m  BMI: Estimated body mass index is 30.67 kg/m as calculated from the following:   Height as of this encounter: 5' 6 (1.676 m).   Weight as of this encounter: 190 lb (86.2 kg). Ideal: Ideal body weight: 63.8 kg (140 lb 10.5 oz) Adjusted ideal body weight: 72.8 kg (160 lb 6.3 oz)  Patient is severely deconditioned, severe thoracic kyphosis and scoliosis.  Presents in wheelchair. Severe deconditioning.  Assessment   Diagnosis  1. Lumbar spondylosis   2. Chronic pain syndrome   3. Chronic radicular lumbar pain   4. Degeneration of intervertebral disc of lumbar region with discogenic back pain       Plan of Care    [name] has a current medication list which includes the following long-term medication(s): cetirizine , eliquis , metoprolol  succinate, pantoprazole , pravastatin , quetiapine , sertraline , furosemide , furosemide , and pregabalin .  Pharmacotherapy (Medications Ordered): Meds ordered this encounter  Medications   oxyCODONE -acetaminophen  (PERCOCET) 7.5-325 MG tablet    Sig: Take 1 tablet by mouth 2 (two) times daily as needed for severe pain (pain score 7-10).    Dispense:  60 tablet    Refill:  0   pregabalin   (LYRICA ) 25 MG capsule    Sig: Take 1 capsule (25 mg total) by mouth at bedtime.    Dispense:  30 capsule    Refill:  5   oxyCODONE -acetaminophen  (PERCOCET) 7.5-325 MG tablet    Sig: Take 1 tablet by mouth 2 (two) times daily as needed for severe pain (pain score 7-10).    Dispense:  60 tablet    Refill:  0   oxyCODONE -acetaminophen  (PERCOCET) 7.5-325 MG tablet    Sig: Take 1 tablet by mouth 2 (two) times daily as needed for severe pain (pain score 7-10).    Dispense:  60 tablet    Refill:  0   Orders:  No orders of the defined types were placed in this encounter.  Follow-up plan:   Return in about 3 months (around 12/03/2023) for MM, F2F.    Recent Visits No visits were found meeting these conditions. Showing recent visits within past 90 days and meeting all other requirements Today's Visits Date Type Provider Dept  09/04/23 Office Visit Marcelino Nurse, MD [facility]  Showing today's visits and meeting all other requirements Future Appointments Date Type Provider Dept  11/27/23 Appointment Marcelino Nurse, MD [facility]  Showing future appointments within next 90 days and meeting all other requirements  I discussed the assessment and treatment plan with the patient. The patient was provided an opportunity to ask questions and all were answered. The patient agreed with the plan and demonstrated an understanding of the instructions.  Patient advised to call back or seek an in-person evaluation if the symptoms or condition worsens.  Duration of encounter: .  Total time on encounter, as per AMA guidelines included both the face-to-face and non-face-to-face time personally spent by the physician and/or other qualified health care  professional(s) on the day of the encounter (includes time in activities that require the physician or other qualified health care professional and does not include time in activities normally performed by clinical staff).  Physician's time may include the following activities when performed: preparing to see the patient (eg, review of tests, pre-charting review of records) obtaining and/or reviewing separately obtained history performing a medically appropriate examination and/or evaluation counseling and educating the patient/family/caregiver ordering medications, tests, or procedures referring and communicating with other health care professionals (when not separately reported) documenting clinical information in the electronic or other health record independently interpreting results (not separately reported) and communicating results to the patient/ family/caregiver care coordination (not separately reported)  Note by: Brian Sherry, MD Date: 09/04/2023; Time: 4:05 PM

## 2023-09-04 NOTE — Progress Notes (Signed)
Nursing Pain Medication Assessment:  Safety precautions to be maintained throughout the outpatient stay will include: orient to surroundings, keep bed in low position, maintain call bell within reach at all times, provide assistance with transfer out of bed and ambulation.  Medication Inspection Compliance: Brian [name] did not comply with our request to bring his pills to be counted. He was reminded that bringing the medication bottles, even when empty, is a requirement.  Medication: None brought in. Pill/Patch Count: None available to be counted. Bottle Appearance: No container available. Did not bring bottle(s) to appointment. Filled Date: N/A Last Medication intake:  Today 

## 2023-09-09 ENCOUNTER — Other Ambulatory Visit: Payer: Self-pay | Admitting: Family Medicine

## 2023-09-11 ENCOUNTER — Encounter (HOSPITAL_BASED_OUTPATIENT_CLINIC_OR_DEPARTMENT_OTHER): Payer: Medicare Other | Attending: General Surgery | Admitting: General Surgery

## 2023-09-11 DIAGNOSIS — L98411 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: Secondary | ICD-10-CM | POA: Diagnosis not present

## 2023-09-11 DIAGNOSIS — I2510 Atherosclerotic heart disease of native coronary artery without angina pectoris: Secondary | ICD-10-CM | POA: Insufficient documentation

## 2023-09-11 DIAGNOSIS — L89023 Pressure ulcer of left elbow, stage 3: Secondary | ICD-10-CM | POA: Insufficient documentation

## 2023-09-11 DIAGNOSIS — I4819 Other persistent atrial fibrillation: Secondary | ICD-10-CM | POA: Insufficient documentation

## 2023-09-11 DIAGNOSIS — I6300 Cerebral infarction due to thrombosis of unspecified precerebral artery: Secondary | ICD-10-CM | POA: Insufficient documentation

## 2023-09-11 DIAGNOSIS — L98412 Non-pressure chronic ulcer of buttock with fat layer exposed: Secondary | ICD-10-CM | POA: Insufficient documentation

## 2023-09-11 NOTE — Progress Notes (Addendum)
Brian [name] ([id number]) [id number] Page 1 of 8 Visit Report for 09/11/2023 Arrival Information Details Patient Name: Date of Service: Brian [name] 09/11/2023 2:30 PM Medical Record Number: [id number] Patient Account Number: [id number] Date of Birth/Sex: Treating RN: 1933-04-05 (89 y.o. [name]) Primary Care Broady Lafoy: Nadene Rubins Other Clinician: Referring Krishika Bugge: Treating Paulanthony Gleaves/Extender: [name] in Treatment: 7 Visit Information History Since Last Visit Added or deleted any medications: No Patient Arrived: Wheel Chair Any new allergies or adverse reactions: No Arrival Time: 14:50 Had a fall or experienced change in No Accompanied By: son activities of daily living that may affect Transfer Assistance: EasyPivot Patient Lift risk of falls: Patient Identification Verified: Yes Signs or symptoms of abuse/neglect since last visito No Secondary Verification Process Completed: Yes Hospitalized since last visit: No Patient Requires Transmission-Based Precautions: No Implantable device outside of the clinic excluding No Patient Has Alerts: Yes cellular tissue based products placed in the center Patient Alerts: Patient on Blood Thinner since last visit: Pain Present Now: No Electronic Signature(s) Signed: 09/11/2023 3:03:29 PM By: Dayton Scrape Entered By: Dayton Scrape on 09/11/2023 14:50:41 -------------------------------------------------------------------------------- Encounter Discharge Information Details Patient Name: Date of Service: Brian RDO [name] ID [name] 09/11/2023 2:30 PM Medical Record Number: [id number] Patient Account Number: [id number] Date of Birth/Sex: Treating RN: November 28, 1932 (89 y.o. Damaris Schooner Primary Care Pierina Schuknecht: Nadene Rubins Other Clinician: Referring Malli Falotico: Treating Adon Gehlhausen/Extender: [name] in Treatment: 65 Encounter Discharge Information Items Post  Procedure Vitals Discharge Condition: Stable Temperature (F): 97.2 Ambulatory Status: Wheelchair Pulse (bpm): 64 Discharge Destination: Home Respiratory Rate (breaths/min): 18 Transportation: Private Auto Blood Pressure (mmHg): 120/60 Accompanied By: son and caregiver Schedule Follow-up Appointment: Yes Clinical Summary of Care: Patient Declined Electronic Signature(s) Signed: 09/11/2023 4:35:06 PM By: Zenaida Deed RN, BSN Entered By: Zenaida Deed on 09/11/2023 15:39:15 Brian [name] ([id number]) [id number] Page 2 of 8 -------------------------------------------------------------------------------- Lower Extremity Assessment Details Patient Name: Date of Service: Brian [name] 09/11/2023 2:30 PM Medical Record Number: [id number] Patient Account Number: [id number] Date of Birth/Sex: Treating RN: [birth year] (89 y.o. [name] Primary Care Zurisadai Helminiak: Nadene Rubins Other Clinician: Referring Rober Skeels: Treating Hrithik Boschee/Extender: [name] in Treatment: 23 Electronic Signature(s) Signed: 09/11/2023 4:21:14 PM By: Karie Schwalbe RN Entered By: Karie Schwalbe on 09/11/2023 16:10:35 -------------------------------------------------------------------------------- Multi-Disciplinary Care Plan Details Patient Name: Date of Service: Brian [name] 09/11/2023 2:30 PM Medical Record Number: [id number] Patient Account Number: [id number] Date of Birth/Sex: Treating RN: March 23, 1933 (89 y.o. Damaris Schooner Primary Care Ramyah Pankowski: Nadene Rubins Other Clinician: Referring Antawan Mchugh: Treating Elmira Olkowski/Extender: [name] in Treatment: 67 Multidisciplinary Care Plan reviewed with physician Active Inactive Abuse / Safety / Falls / Self Care Management Nursing Diagnoses: Impaired physical mobility Potential for falls Goals: Patient/caregiver will verbalize understanding of skin  care regimen Date Initiated: 02/14/2022 Date Inactivated: 04/17/2023 Target Resolution Date: 04/21/2023 Goal Status: Met Patient/caregiver will verbalize/demonstrate measures taken to prevent injury and/or falls Date Initiated: 02/14/2022 Target Resolution Date: 09/21/2023 Goal Status: Active Interventions: Assess fall risk on admission and as needed Assess self care needs on admission and as needed Notes: Wound/Skin Impairment Nursing Diagnoses: Impaired tissue integrity Knowledge deficit related to ulceration/compromised skin integrity Goals: Patient/caregiver will verbalize understanding of skin care regimen Date Initiated: 02/14/2022 Target Resolution Date: 09/21/2023 Goal Status: Active Ulcer/skin breakdown will have a volume reduction of 30% by week 4 Date Initiated: 02/14/2022 Date Inactivated:  05/23/2022 Target Resolution Date: 05/05/2022 Goal Status: Unmet Unmet Reason: new PU left glut Brian [name] ([id number]) [id number] Page 3 of 8 Interventions: Assess patient/caregiver ability to obtain necessary supplies Assess patient/caregiver ability to perform ulcer/skin care regimen upon admission and as needed Assess ulceration(s) every visit Treatment Activities: Skin care regimen initiated : 02/14/2022 Topical wound management initiated : 02/14/2022 Notes: Electronic Signature(s) Signed: 09/11/2023 4:35:06 PM By: Zenaida Deed RN, BSN Entered By: Zenaida Deed on 09/11/2023 15:36:26 -------------------------------------------------------------------------------- Pain Assessment Details Patient Name: Date of Service: Brian [name] 09/11/2023 2:30 PM Medical Record Number: [id number] Patient Account Number: [id number] Date of Birth/Sex: Treating RN: January 28, 1933 (89 y.o. [name]) Primary Care Karley Pho: Nadene Rubins Other Clinician: Referring Mehul Rudin: Treating Keith Felten/Extender: [name] in Treatment: 82 Active  Problems Location of Pain Severity and Description of Pain Patient Has Paino No Site Locations Pain Management and Medication Current Pain Management: Electronic Signature(s) Signed: 09/11/2023 3:03:29 PM By: Dayton Scrape Entered By: Dayton Scrape on 09/11/2023 14:51:07 -------------------------------------------------------------------------------- Patient/Caregiver Education Details Patient Name: Date of Service: Brian [name] 1/21/2025andnbsp2:30 PM Dazey, Nicholus Judie Petit ([id number]) [id number] Page 4 of 8 Medical Record Number: [id number] Patient Account Number: [id number] Date of Birth/Gender: Treating RN: 07/17/1933 (89 y.o. Damaris Schooner Primary Care Physician: Nadene Rubins Other Clinician: Referring Physician: Treating Physician/Extender: [name] in Treatment: 20 Education Assessment Education Provided To: Patient Education Topics Provided Offloading: Pressure: Methods: Explain/Verbal Responses: Reinforcements needed, State content correctly Wound/Skin Impairment: Methods: Explain/Verbal Responses: Reinforcements needed, State content correctly Electronic Signature(s) Signed: 09/11/2023 4:35:06 PM By: Zenaida Deed RN, BSN Entered By: Zenaida Deed on 09/11/2023 15:38:07 -------------------------------------------------------------------------------- Wound Assessment Details Patient Name: Date of Service: Brian [name] 09/11/2023 2:30 PM Medical Record Number: [id number] Patient Account Number: [id number] Date of Birth/Sex: Treating RN: 01-09-1933 (89 y.o. [name]) Primary Care Anthonio Mizzell: Nadene Rubins Other Clinician: Referring Olon Russ: Treating Mohammed Mcandrew/Extender: [name] in Treatment: 82 Wound Status Wound Number: 1 Primary Pressure Ulcer Etiology: Wound Location: Left Elbow Wound Open Wounding Event: Pressure Injury Status: Date Acquired: 12/19/2021 Comorbid  Cataracts, Arrhythmia, Coronary Artery Disease, Weeks Of Treatment: 82 History: Hypotension, Osteoarthritis Clustered Wound: Yes Photos Wound Measurements Length: (cm) 1 Width: (cm) 1.5 Depth: (cm) 0.3 Area: (cm) 1.1 Volume: (cm) 0.3 Malkiewicz, Prophet [place] ([id number]) Wound Description Classification: Category/Stage III Wound Margin: Distinct, outline attach Exudate Amount: Medium Exudate Type: Serosanguineous Exudate Color: red, brown Foul Odor After Cleansing: Slough/Fibrino % Reduction in Area: 87.8% % Reduction in Volume: 63.3% Epithelialization: Small (1-33%) 78 Tunneling: No 53 Undermining: No [id number] Page 5 of 8 ed No Yes Wound Bed Granulation Amount: Medium (34-66%) Exposed Structure Granulation Quality: Pink, Pale Fascia Exposed: No Necrotic Amount: Medium (34-66%) Fat Layer (Subcutaneous Tissue) Exposed: Yes Necrotic Quality: Adherent Slough Tendon Exposed: No Muscle Exposed: No Joint Exposed: No Bone Exposed: No Periwound Skin Texture Texture Color No Abnormalities Noted: Yes No Abnormalities Noted: Yes Moisture Temperature / Pain No Abnormalities Noted: No Temperature: No Abnormality Maceration: Yes Tenderness on Palpation: Yes Treatment Notes Wound #1 (Elbow) Wound Laterality: Left Cleanser Soap and Water Discharge Instruction: May shower and wash wound with dial antibacterial soap and water prior to dressing change. Wound Cleanser Discharge Instruction: Cleanse the wound with wound cleanser prior to applying a clean dressing using gauze sponges, not tissue or cotton balls. Peri-Wound Care Zinc Oxide Ointment 30g tube Discharge Instruction: Apply Zinc Oxide to periwound with each dressing change Topical  Gentamicin Discharge Instruction: As directed by physician Mupirocin Ointment Discharge Instruction: Apply Mupirocin (Bactroban) as instructed Primary Dressing Hydrofera Blue Ready Transfer Foam, 2.5x2.5  (in/in) Discharge Instruction: Apply directly to wound bed as directed Secondary Dressing ALLEVYN Gentle Border, 5x5 (in/in) Discharge Instruction: Apply over primary dressing as directed. Secured With Compression Wrap Compression Stockings Add-Ons Engineer, maintenance, 6 (in) [employer]) Signed: 09/11/2023 4:35:06 PM By: Zenaida Deed RN, BSN Previous Signature: 09/11/2023 3:03:29 PM Version By: Dayton Scrape Entered By: Zenaida Deed on 09/11/2023 15:15:49 Surowiec, Paarth [name] ([id number]) [id number] Page 6 of 8 -------------------------------------------------------------------------------- Wound Assessment Details Patient Name: Date of Service: Brian [name] 09/11/2023 2:30 PM Medical Record Number: [id number] Patient Account Number: [id number] Date of Birth/Sex: Treating RN: 1932-08-30 (89 y.o. [name]) Primary Care Lucienne Sawyers: Nadene Rubins Other Clinician: Referring Secret Kristensen: Treating Marisal Swarey/Extender: [name] in Treatment: 82 Wound Status Wound Number: 3 Primary Skin Tear Etiology: Wound Location: Left Gluteus Wound Open Wounding Event: Shear/Friction Status: Date Acquired: 06/18/2023 Comorbid Cataracts, Arrhythmia, Coronary Artery Disease, Weeks Of Treatment: 11 History: Hypotension, Osteoarthritis Clustered Wound: No Photos Wound Measurements Length: (cm) 1 Width: (cm) 1.3 Depth: (cm) 0.1 Area: (cm) 1.021 Volume: (cm) 0.102 % Reduction in Area: 87.6% % Reduction in Volume: 87.6% Epithelialization: Medium (34-66%) Tunneling: No Undermining: No Wound Description Classification: Full Thickness Without Exposed Suppor Wound Margin: Distinct, outline attached Exudate Amount: Medium Exudate Type: Serosanguineous Exudate Color: red, brown t Structures Foul Odor After Cleansing: No Slough/Fibrino Yes Wound Bed Granulation Amount: Large (67-100%) Exposed Structure Granulation Quality:  Red Fascia Exposed: No Necrotic Amount: Small (1-33%) Fat Layer (Subcutaneous Tissue) Exposed: Yes Necrotic Quality: Eschar Tendon Exposed: No Muscle Exposed: No Joint Exposed: No Bone Exposed: No Periwound Skin Texture Texture Color No Abnormalities Noted: No No Abnormalities Noted: No Rash: Yes Rubor: No Moisture Temperature / Pain No Abnormalities Noted: No Temperature: No Abnormality Dry / Scaly: Yes Treatment Notes Wound #3 (Gluteus) Wound Laterality: Left Cleanser Soap and Water Discharge Instruction: May shower and wash wound with dial antibacterial soap and water prior to dressing change. Wound Cleanser Discharge Instruction: Cleanse the wound with wound cleanser prior to applying a clean dressing using gauze sponges, not tissue or cotton balls. RANKIN, NOVEMBER ([id number]) [id number] Page 7 of 8 Peri-Wound Care Zinc Oxide Ointment 30g tube Discharge Instruction: Apply Zinc Oxide to periwound with each dressing change Topical Primary Dressing Maxorb Extra Ag+ Alginate Dressing, 2x2 (in/in) Discharge Instruction: Apply to wound bed as instructed Secondary Dressing Zetuvit Plus Silicone Border Dressing 5x5 (in/in) Discharge Instruction: Apply silicone border over primary dressing as directed. Secured With Compression Wrap Compression Stockings Facilities manager) Signed: 09/11/2023 4:35:06 PM By: Zenaida Deed RN, BSN Previous Signature: 09/11/2023 3:03:29 PM Version By: Dayton Scrape Entered By: Zenaida Deed on 09/11/2023 15:19:06 -------------------------------------------------------------------------------- Wound Assessment Details Patient Name: Date of Service: Brian [name] 09/11/2023 2:30 PM Medical Record Number: [id number] Patient Account Number: [id number] Date of Birth/Sex: Treating RN: Apr 15, 1933 (89 y.o. Damaris Schooner Primary Care Daily Doe: Nadene Rubins Other Clinician: Referring  Cutler Sunday: Treating Alyscia Carmon/Extender: [name] in Treatment: 82 Wound Status Wound Number: 4 Primary Abrasion Etiology: Wound Location: Penis Wound Open Wounding Event: Shear/Friction Status: Date Acquired: 06/26/2023 Comorbid Cataracts, Arrhythmia, Coronary Artery Disease, Weeks Of Treatment: 11 History: Hypotension, Osteoarthritis Clustered Wound: No Photos Wound Measurements Length: (cm) Width: (cm) Depth: (cm) Area: (cm) Volume: (cm) 0 % Reduction in Area: 100% 0 % Reduction in Volume: 100%  0 Epithelialization: Large (67-100%) 0 Tunneling: No 0 Undermining: No Wound Description Classification: Full Thickness Without Exposed REMIGIO, KARAMAN ([id number]) Exudate Amount: None Present rt Structures Foul Odor After Cleansing: No [id number] Page 8 of 8 Slough/Fibrino No Wound Bed Granulation Amount: None Present (0%) Exposed Structure Necrotic Amount: None Present (0%) Fascia Exposed: No Fat Layer (Subcutaneous Tissue) Exposed: No Tendon Exposed: No Muscle Exposed: No Joint Exposed: No Bone Exposed: No Periwound Skin Texture Texture Color No Abnormalities Noted: Yes No Abnormalities Noted: Yes Moisture Temperature / Pain No Abnormalities Noted: Yes Temperature: No Abnormality Electronic Signature(s) Signed: 09/11/2023 4:35:06 PM By: Zenaida Deed RN, BSN Previous Signature: 09/11/2023 3:03:29 PM Version By: Dayton Scrape Entered By: Zenaida Deed on 09/11/2023 15:18:12 -------------------------------------------------------------------------------- Vitals Details Patient Name: Date of Service: Brian Hubert Azure, DA V ID [name] 09/11/2023 2:30 PM Medical Record Number: [id number] Patient Account Number: [id number] Date of Birth/Sex: Treating RN: 02/02/1933 (89 y.o. [name]) Primary Care Vanna Sailer: Nadene Rubins Other Clinician: Referring Tymber Stallings: Treating Beulah Matusek/Extender: [name]  in Treatment: 82 Vital Signs Time Taken: 02:50 Temperature (F): 97.2 Height (in): 66 Pulse (bpm): 64 Weight (lbs): 185 Respiratory Rate (breaths/min): 18 Body Mass Index (BMI): 29.9 Blood Pressure (mmHg): 120/60 Reference Range: 80 - 120 mg / dl Electronic Signature(s) Signed: 09/11/2023 3:03:29 PM By: Dayton Scrape Entered By: Dayton Scrape on 09/11/2023 14:50:59

## 2023-09-13 ENCOUNTER — Other Ambulatory Visit: Payer: Self-pay | Admitting: Family Medicine

## 2023-09-13 DIAGNOSIS — I6300 Cerebral infarction due to thrombosis of unspecified precerebral artery: Secondary | ICD-10-CM

## 2023-09-13 DIAGNOSIS — I480 Paroxysmal atrial fibrillation: Secondary | ICD-10-CM

## 2023-09-27 ENCOUNTER — Telehealth: Payer: Self-pay

## 2023-09-27 NOTE — Telephone Encounter (Signed)
   Pre-operative Risk Assessment    Patient Name: Brian [name]  DOB: 01-Jun-1933 MRN: [id number]   Date of last office visit: 04/24/23 Date of next office visit: Not scheduled  Request for Surgical Clearance    Procedure:  Dental Extraction - Amount of Teeth to be Pulled:  1 tooth  Date of Surgery:  Clearance TBD                                Surgeon:  Drs. DeSalvo and Newsome Surgeon's Group or Practice Name:  [facility] Phone number:  [phone] Fax number:  [phone]   Type of Clearance Requested:   - Medical  - Pharmacy:  Hold Apixaban  (Eliquis )     Type of Anesthesia:  Local    Additional requests/questions:    Brian [name]   09/27/2023, 12:44 PM

## 2023-09-28 NOTE — Telephone Encounter (Signed)
    Primary Cardiologist: Annabella Scarce, MD  Chart reviewed as part of pre-operative protocol coverage. Simple dental extractions are considered low risk procedures per guidelines and generally do not require any specific cardiac clearance. It is also generally accepted that for simple extractions and dental cleanings, there is no need to interrupt blood thinner therapy.   SBE prophylaxis is not required for the patient.  I will route this recommendation to the requesting party via Epic fax function and remove from pre-op pool.  Please call with questions.  Josefa CHRISTELLA Beauvais, NP 09/28/2023, 11:23 AM

## 2023-10-02 ENCOUNTER — Telehealth: Payer: Self-pay | Admitting: Student in an Organized Health Care Education/Training Program

## 2023-10-02 DIAGNOSIS — G894 Chronic pain syndrome: Secondary | ICD-10-CM

## 2023-10-02 NOTE — Telephone Encounter (Signed)
Patient is having a lot of break thru pain during the night. Is their anything to give the patient for this? Please call Roxanne. Patient is calling her several times a night because he is having so much pain.

## 2023-10-02 NOTE — Telephone Encounter (Signed)
Any suggestions?  He takes Oxy/Acet 7.5/325 mg bid prn and Pregabalin 25 mg at bedtime

## 2023-10-04 MED ORDER — PREGABALIN 25 MG PO CAPS
50.0000 mg | ORAL_CAPSULE | Freq: Every day | ORAL | 5 refills | Status: DC
Start: 2023-10-04 — End: 2023-11-27

## 2023-10-04 MED ORDER — DICLOFENAC EPOLAMINE 1.3 % EX PTCH: 1.0000 | MEDICATED_PATCH | Freq: Two times a day (BID) | CUTANEOUS | 0 refills | Status: AC

## 2023-10-04 NOTE — Telephone Encounter (Signed)
Will need a new prescription for Lyrica with directions 2 at bedtime. Also, he already takes Tylenol ES at bedtime. Also, daughter is asking for a "patch."  I asked her what type of patch, because there are several types of medication patches, she does not know.  She is insistent on him getting a "patch."

## 2023-10-04 NOTE — Telephone Encounter (Signed)
Patient's daughter Leandro Reasoner is calling again asking if patient can be given anything for breakthru pain at night?

## 2023-10-08 NOTE — Telephone Encounter (Signed)
Spoke with Brian [name], informed her that new Rx for Pregabalin has been sent to pharmacy with instructions of 2 at bedtime. Also informed her of Diclofenac patches, given instructions on how to use them.

## 2023-10-16 ENCOUNTER — Other Ambulatory Visit: Payer: Self-pay | Admitting: Family Medicine

## 2023-10-16 DIAGNOSIS — I6300 Cerebral infarction due to thrombosis of unspecified precerebral artery: Secondary | ICD-10-CM

## 2023-10-23 ENCOUNTER — Ambulatory Visit (HOSPITAL_BASED_OUTPATIENT_CLINIC_OR_DEPARTMENT_OTHER): Payer: Medicare Other | Admitting: General Surgery

## 2023-10-27 ENCOUNTER — Other Ambulatory Visit: Payer: Self-pay | Admitting: Family Medicine

## 2023-10-27 DIAGNOSIS — G4709 Other insomnia: Secondary | ICD-10-CM

## 2023-11-01 ENCOUNTER — Other Ambulatory Visit: Payer: Self-pay | Admitting: Family Medicine

## 2023-11-01 DIAGNOSIS — G4709 Other insomnia: Secondary | ICD-10-CM

## 2023-11-01 NOTE — Telephone Encounter (Signed)
 Copied from CRM [id number]. Topic: Clinical - Medication Refill >> Nov 01, 2023  5:26 PM Denese Killings wrote: Most Recent Primary Care Visit:  Provider: Julious Payer A  Department: [facility]  Visit Type: CCM TELEPHONE CALL  Date: 06/22/2022  Medication: QUEtiapine (SEROQUEL) 50 MG tablet  Has the patient contacted their pharmacy? Yes (Agent: If no, request that the patient contact the pharmacy for the refill. If patient does not wish to contact the pharmacy document the reason why and proceed with request.) (Agent: If yes, when and what did the pharmacy advise?) contact doctor for refill  Is this the correct pharmacy for this prescription? Yes If no, delete pharmacy and type the correct one.  This is the patient's preferred pharmacy:  [id number], [place] - [address] AT [facility] OF [facility] & [address] [facility] [place] [address] Phone: [phone] Fax: [phone]  Has the prescription been filled recently? No  Is the patient out of the medication? Yes  Has the patient been seen for an appointment in the last year OR does the patient have an upcoming appointment? No  Can we respond through MyChart? no  Agent: Please be advised that Rx refills may take up to 3 business days. We ask that you follow-up with your pharmacy.

## 2023-11-02 ENCOUNTER — Other Ambulatory Visit: Payer: Self-pay | Admitting: Family Medicine

## 2023-11-02 ENCOUNTER — Telehealth: Payer: Self-pay | Admitting: Cardiovascular Disease

## 2023-11-02 NOTE — Telephone Encounter (Signed)
 Returned call to patient and caregiver, patient will switch to regular allegra.

## 2023-11-02 NOTE — Telephone Encounter (Signed)
 He can take plain allergra, but should NOT take allegra D. The mean means it has pseudoephedrine in it which can increase blood pressure and heart rate.

## 2023-11-02 NOTE — Telephone Encounter (Signed)
  Pt c/o medication issue:  1. Name of Medication: Allegra D  2. How are you currently taking this medication (dosage and times per day)?    3. Are you having a reaction (difficulty breathing--STAT)? no  4. What is your medication issue? Calling to see if its okay to take medication with his condition. Please advise

## 2023-11-02 NOTE — Telephone Encounter (Unsigned)
 Copied from CRM [id number]. Topic: Clinical - Medication Refill >> Nov 02, 2023  5:51 PM Martha Clan wrote: Most Recent Primary Care Visit:  Provider: Julious Payer A  Department: [facility]  Visit Type: CCM TELEPHONE CALL  Date: 06/22/2022  Medication: QUEtiapine (SEROQUEL) 50 MG tablet [[id number]]  Has the patient contacted their pharmacy? Yes (Agent: If no, request that the patient contact the pharmacy for the refill. If patient does not wish to contact the pharmacy document the reason why and proceed with request.) (Agent: If yes, when and what did the pharmacy advise?)  Is this the correct pharmacy for this prescription? Yes If no, delete pharmacy and type the correct one.  This is the patient's preferred pharmacy:  [facility] #[id number] [facility], [place] - [address] AT [facility] OF [facility] & [address] [facility]  [address] Phone: [phone] Fax: [phone]    Has the prescription been filled recently? No  Is the patient out of the medication? Yes  Has the patient been seen for an appointment in the last year OR does the patient have an upcoming appointment? Yes  Can we respond through MyChart? Yes  Agent: Please be advised that Rx refills may take up to 3 business days. We ask that you follow-up with your pharmacy.

## 2023-11-02 NOTE — Telephone Encounter (Signed)
**Note De-identified  [place] Obfuscation** Please advise 

## 2023-11-03 ENCOUNTER — Other Ambulatory Visit: Payer: Self-pay | Admitting: Family Medicine

## 2023-11-03 DIAGNOSIS — G4709 Other insomnia: Secondary | ICD-10-CM

## 2023-11-04 ENCOUNTER — Other Ambulatory Visit: Payer: Self-pay | Admitting: Family Medicine

## 2023-11-04 DIAGNOSIS — G4709 Other insomnia: Secondary | ICD-10-CM

## 2023-11-05 ENCOUNTER — Telehealth: Payer: Self-pay

## 2023-11-05 NOTE — Telephone Encounter (Signed)
 Copied from CRM [id number]. Topic: Clinical - Prescription Issue >> Nov 02, 2023  5:49 PM Martha Clan wrote: Reason for CRM: QUEtiapine (SEROQUEL) 50 MG tablet [[id number]] Patient's pharmacy is not allowing the refill of the medication.

## 2023-11-05 NOTE — Telephone Encounter (Signed)
 Copied from CRM [id number]. Topic: Clinical - Medical Advice >> Nov 02, 2023 12:00 PM [name] wrote: Reason for CRM: Patient called in wanting to know if he is able to take Allegra-D with his current heart medication

## 2023-11-06 ENCOUNTER — Encounter (HOSPITAL_BASED_OUTPATIENT_CLINIC_OR_DEPARTMENT_OTHER): Attending: General Surgery | Admitting: General Surgery

## 2023-11-06 DIAGNOSIS — L98412 Non-pressure chronic ulcer of buttock with fat layer exposed: Secondary | ICD-10-CM | POA: Insufficient documentation

## 2023-11-06 DIAGNOSIS — I4819 Other persistent atrial fibrillation: Secondary | ICD-10-CM | POA: Diagnosis not present

## 2023-11-06 DIAGNOSIS — I2510 Atherosclerotic heart disease of native coronary artery without angina pectoris: Secondary | ICD-10-CM | POA: Diagnosis not present

## 2023-11-06 DIAGNOSIS — L98411 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: Secondary | ICD-10-CM | POA: Diagnosis not present

## 2023-11-06 DIAGNOSIS — L89023 Pressure ulcer of left elbow, stage 3: Secondary | ICD-10-CM | POA: Diagnosis present

## 2023-11-06 DIAGNOSIS — G8194 Hemiplegia, unspecified affecting left nondominant side: Secondary | ICD-10-CM | POA: Insufficient documentation

## 2023-11-13 ENCOUNTER — Encounter: Payer: Self-pay | Admitting: Family Medicine

## 2023-11-13 ENCOUNTER — Ambulatory Visit (INDEPENDENT_AMBULATORY_CARE_PROVIDER_SITE_OTHER): Admitting: Family Medicine

## 2023-11-13 VITALS — BP 98/58 | HR 73 | Temp 97.8°F | Ht 66.0 in

## 2023-11-13 DIAGNOSIS — E7800 Pure hypercholesterolemia, unspecified: Secondary | ICD-10-CM

## 2023-11-13 DIAGNOSIS — R8290 Unspecified abnormal findings in urine: Secondary | ICD-10-CM

## 2023-11-13 DIAGNOSIS — I480 Paroxysmal atrial fibrillation: Secondary | ICD-10-CM

## 2023-11-13 DIAGNOSIS — R5381 Other malaise: Secondary | ICD-10-CM | POA: Diagnosis not present

## 2023-11-13 DIAGNOSIS — G4709 Other insomnia: Secondary | ICD-10-CM

## 2023-11-13 DIAGNOSIS — Z131 Encounter for screening for diabetes mellitus: Secondary | ICD-10-CM

## 2023-11-13 DIAGNOSIS — I10 Essential (primary) hypertension: Secondary | ICD-10-CM

## 2023-11-13 DIAGNOSIS — K219 Gastro-esophageal reflux disease without esophagitis: Secondary | ICD-10-CM

## 2023-11-13 LAB — COMPREHENSIVE METABOLIC PANEL
ALT: 13 U/L (ref 0–53)
AST: 18 U/L (ref 0–37)
Albumin: 4.2 g/dL (ref 3.5–5.2)
Alkaline Phosphatase: 76 U/L (ref 39–117)
BUN: 14 mg/dL (ref 6–23)
CO2: 29 meq/L (ref 19–32)
Calcium: 9 mg/dL (ref 8.4–10.5)
Chloride: 102 meq/L (ref 96–112)
Creatinine, Ser: 0.7 mg/dL (ref 0.40–1.50)
GFR: 82.07 mL/min (ref >60.00)
Glucose, Bld: 118 mg/dL — ABNORMAL HIGH (ref 70–99)
Potassium: 4.5 meq/L (ref 3.5–5.1)
Sodium: 138 meq/L (ref 135–145)
Total Bilirubin: 0.3 mg/dL (ref 0.2–1.2)
Total Protein: 6.7 g/dL (ref 6.0–8.3)

## 2023-11-13 LAB — URINALYSIS, ROUTINE W REFLEX MICROSCOPIC
Bilirubin Urine: NEGATIVE
Hgb urine dipstick: NEGATIVE
Ketones, ur: NEGATIVE
Leukocytes,Ua: NEGATIVE
Nitrite: NEGATIVE
RBC / HPF: NONE SEEN (ref 0–?)
Specific Gravity, Urine: 1.02 (ref 1.000–1.030)
Total Protein, Urine: NEGATIVE
Urine Glucose: NEGATIVE
Urobilinogen, UA: 0.2 (ref 0.0–1.0)
pH: 5.5 (ref 5.0–8.0)

## 2023-11-13 LAB — HEMOGLOBIN A1C: Hgb A1c MFr Bld: 5.8 % (ref 4.6–6.5)

## 2023-11-13 LAB — CBC
HCT: 35.6 % — ABNORMAL LOW (ref 39.0–52.0)
Hemoglobin: 11.3 g/dL — ABNORMAL LOW (ref 13.0–17.0)
MCHC: 31.8 g/dL (ref 30.0–36.0)
MCV: 78.7 fl (ref 78.0–100.0)
Platelets: 232 10*3/uL (ref 150.0–400.0)
RBC: 4.52 Mil/uL (ref 4.22–5.81)
RDW: 18.5 % — ABNORMAL HIGH (ref 11.5–15.5)
WBC: 6.1 10*3/uL (ref 4.0–10.5)

## 2023-11-13 LAB — LDL CHOLESTEROL, DIRECT: Direct LDL: 46 mg/dL

## 2023-11-13 MED ORDER — PANTOPRAZOLE SODIUM 40 MG PO TBEC: 40.0000 mg | DELAYED_RELEASE_TABLET | Freq: Every day | ORAL | 3 refills | Status: AC

## 2023-11-13 MED ORDER — METOPROLOL SUCCINATE ER 25 MG PO TB24
25.0000 mg | ORAL_TABLET | Freq: Every day | ORAL | 3 refills | Status: DC
Start: 2023-11-13 — End: 2024-01-11

## 2023-11-13 MED ORDER — FUROSEMIDE 40 MG PO TABS: 40.0000 mg | ORAL_TABLET | Freq: Every day | ORAL | 3 refills | Status: DC

## 2023-11-13 MED ORDER — QUETIAPINE FUMARATE 50 MG PO TABS: ORAL_TABLET | ORAL | 3 refills | Status: AC

## 2023-11-13 NOTE — Progress Notes (Signed)
 Established Patient Office Visit   Subjective:  Patient ID: Brian [name], male    DOB: 1933-03-24  Age: 89 y.o. MRN: [id number]  Chief Complaint  Patient presents with   Medical Management of Chronic Issues    Follow up the patient     HPI Encounter Diagnoses  Name Primary?   Debilitated Yes   Gastroesophageal reflux disease    Other insomnia    Paroxysmal atrial fibrillation (HCC)    Elevated LDL cholesterol level    Screening for diabetes mellitus    Essential hypertension    Abnormal urine    For follow-up of above.  Here with his son and caregiver.  Continues follow-up with pain management, wound care and cardiology.  Brings in a urine sample collected from his penis.  Nutritional intake has been excellent.   Review of Systems  Constitutional: Negative.   HENT: Negative.    Eyes:  Negative for blurred vision, discharge and redness.  Respiratory: Negative.  Negative for shortness of breath.   Cardiovascular: Negative.  Negative for chest pain.  Gastrointestinal:  Negative for abdominal pain.  Genitourinary: Negative.   Musculoskeletal:  Positive for back pain. Negative for myalgias.  Skin:  Negative for rash.  Neurological:  Negative for tingling, loss of consciousness and weakness.  Endo/Heme/Allergies:  Negative for polydipsia.     Current Outpatient Medications:    ALPRAZolam (XANAX) 0.25 MG tablet, Take 0.25 mg by mouth 2 (two) times daily as needed for anxiety., Disp: , Rfl:    cetirizine (ZYRTEC) 10 MG tablet, TAKE 1 TABLET(10 MG) BY MOUTH AT BEDTIME, Disp: 90 tablet, Rfl: 2   clotrimazole (LOTRIMIN) 1 % cream, Apply 1 Application topically 2 (two) times daily., Disp: , Rfl:    ELIQUIS 2.5 MG TABS tablet, TAKE 1 TABLET(2.5 MG) BY MOUTH TWICE DAILY, Disp: 180 tablet, Rfl: 1   finasteride (PROSCAR) 5 MG tablet, TAKE 1 TABLET(5 MG) BY MOUTH DAILY, Disp: 90 tablet, Rfl: 3   fluticasone (FLONASE) 50 MCG/ACT nasal spray, Place 1 spray into both nostrils daily as  needed for allergies., Disp: , Rfl:    furosemide (LASIX) 40 MG tablet, Take 1 tablet (40 mg total) by mouth daily., Disp: 90 tablet, Rfl: 3   gentamicin ointment (GARAMYCIN) 0.1 %, Apply topically as needed (pressure sore)., Disp: , Rfl:    hydrocortisone ointment 0.5 %, Apply 1 Application topically daily as needed for itching., Disp: , Rfl:    melatonin 5 MG TABS, Take 5 mg by mouth at bedtime as needed (sleep)., Disp: , Rfl:    Multiple Vitamin (MULTIVITAMIN) tablet, Take 1 tablet by mouth 2 (two) times daily., Disp: , Rfl:    MUPIROCIN EX, Apply 1 Application topically daily as needed (wound care)., Disp: , Rfl:    oxyCODONE-acetaminophen (PERCOCET) 7.5-325 MG tablet, Take 1 tablet by mouth 2 (two) times daily as needed for severe pain (pain score 7-10)., Disp: 60 tablet, Rfl: 0   polyethylene glycol powder (GLYCOLAX/MIRALAX) 17 GM/SCOOP powder, Mix one scoop with water and take daily until stools loosen. May repeat as needed. (Patient taking differently: Take 17 g by mouth daily as needed for mild constipation.), Disp: 850 g, Rfl: 1   pravastatin (PRAVACHOL) 40 MG tablet, TAKE 1 TABLET(40 MG) BY MOUTH DAILY, Disp: 90 tablet, Rfl: 3   pregabalin (LYRICA) 25 MG capsule, Take 2 capsules (50 mg total) by mouth at bedtime., Disp: 30 capsule, Rfl: 5   sertraline (ZOLOFT) 50 MG tablet, TAKE 1 TABLET(50 MG) BY  MOUTH EVERY MORNING, Disp: 90 tablet, Rfl: 3   tamsulosin (FLOMAX) 0.4 MG CAPS capsule, TAKE 1 CAPSULE(0.4 MG) BY MOUTH DAILY, Disp: 90 capsule, Rfl: 3   metoprolol succinate (TOPROL-XL) 25 MG 24 hr tablet, Take 1 tablet (25 mg total) by mouth daily., Disp: 90 tablet, Rfl: 3   pantoprazole (PROTONIX) 40 MG tablet, Take 1 tablet (40 mg total) by mouth daily., Disp: 90 tablet, Rfl: 3   QUEtiapine (SEROQUEL) 50 MG tablet, TAKE 1 TABLET(50 MG) BY MOUTH AT BEDTIME, Disp: 90 tablet, Rfl: 3   Objective:     BP (!) 98/58   Pulse 73   Temp 97.8 F (36.6 C)   Ht 5\' 6"  (1.676 m)   SpO2 98%   BMI  30.67 kg/m  BP Readings from Last 3 Encounters:  11/13/23 (!) 98/58  09/04/23 114/74  06/05/23 121/66      Physical Exam Constitutional:      General: He is not in acute distress.    Appearance: Normal appearance. He is not ill-appearing, toxic-appearing or diaphoretic.  HENT:     Head: Normocephalic and atraumatic.     Right Ear: Tympanic membrane, ear canal and external ear normal.     Left Ear: Tympanic membrane, ear canal and external ear normal.  Eyes:     General: No scleral icterus.       Right eye: No discharge.        Left eye: No discharge.     Extraocular Movements: Extraocular movements intact.     Conjunctiva/sclera: Conjunctivae normal.  Cardiovascular:     Rate and Rhythm: Normal rate and regular rhythm.  Pulmonary:     Effort: Pulmonary effort is normal. No respiratory distress.     Breath sounds: No wheezing, rhonchi or rales.  Musculoskeletal:     Right lower leg: No edema.     Left lower leg: No edema.  Skin:    General: Skin is warm and dry.  Neurological:     Mental Status: He is alert and oriented to person, place, and time.     Motor: Weakness present.  Psychiatric:        Mood and Affect: Mood normal.        Behavior: Behavior normal.      No results found for any visits on 11/13/23.    The ASCVD Risk score (Arnett DK, et al., 2019) failed to calculate for the following reasons:   The 2019 ASCVD risk score is only valid for ages 13 to 8   Risk score cannot be calculated because patient has a medical history suggesting prior/existing ASCVD    Assessment & Plan:   Debilitated  Gastroesophageal reflux disease -     Pantoprazole Sodium; Take 1 tablet (40 mg total) by mouth daily.  Dispense: 90 tablet; Refill: 3  Other insomnia -     QUEtiapine Fumarate; TAKE 1 TABLET(50 MG) BY MOUTH AT BEDTIME  Dispense: 90 tablet; Refill: 3  Paroxysmal atrial fibrillation (HCC) -     Metoprolol Succinate ER; Take 1 tablet (25 mg total) by mouth  daily.  Dispense: 90 tablet; Refill: 3 -     CBC  Elevated LDL cholesterol level -     Comprehensive metabolic panel -     LDL cholesterol, direct  Screening for diabetes mellitus -     Comprehensive metabolic panel -     Hemoglobin A1c  Essential hypertension -     Furosemide; Take 1 tablet (40 mg total) by mouth  daily.  Dispense: 90 tablet; Refill: 3  Abnormal urine -     Urine Culture -     Urinalysis, Routine w reflex microscopic    Return in about 6 months (around 05/15/2024).  Have decreased Lasix to 40 mg daily.  Additional recommendations made pending results of labs  Mliss Sax, MD

## 2023-11-14 LAB — URINE CULTURE
MICRO NUMBER:: 16244798
Result:: NO GROWTH
SPECIMEN QUALITY:: ADEQUATE

## 2023-11-27 ENCOUNTER — Ambulatory Visit
Payer: Medicare Other | Attending: Student in an Organized Health Care Education/Training Program | Admitting: Student in an Organized Health Care Education/Training Program

## 2023-11-27 ENCOUNTER — Encounter: Payer: Self-pay | Admitting: Student in an Organized Health Care Education/Training Program

## 2023-11-27 VITALS — BP 108/69 | HR 52 | Temp 97.4°F | Resp 16 | Ht 66.0 in | Wt 190.0 lb

## 2023-11-27 DIAGNOSIS — G894 Chronic pain syndrome: Secondary | ICD-10-CM | POA: Diagnosis not present

## 2023-11-27 DIAGNOSIS — M4726 Other spondylosis with radiculopathy, lumbar region: Secondary | ICD-10-CM

## 2023-11-27 DIAGNOSIS — M5136 Other intervertebral disc degeneration, lumbar region with discogenic back pain only: Secondary | ICD-10-CM | POA: Diagnosis not present

## 2023-11-27 DIAGNOSIS — M47816 Spondylosis without myelopathy or radiculopathy, lumbar region: Secondary | ICD-10-CM | POA: Diagnosis present

## 2023-11-27 DIAGNOSIS — M5416 Radiculopathy, lumbar region: Secondary | ICD-10-CM | POA: Insufficient documentation

## 2023-11-27 DIAGNOSIS — G8929 Other chronic pain: Secondary | ICD-10-CM | POA: Insufficient documentation

## 2023-11-27 DIAGNOSIS — M1711 Unilateral primary osteoarthritis, right knee: Secondary | ICD-10-CM | POA: Diagnosis present

## 2023-11-27 MED ORDER — OXYCODONE-ACETAMINOPHEN 7.5-325 MG PO TABS: 1.0000 | ORAL_TABLET | Freq: Two times a day (BID) | ORAL | 0 refills | Status: DC | PRN

## 2023-11-27 MED ORDER — PREGABALIN 25 MG PO CAPS: 50.0000 mg | ORAL_CAPSULE | Freq: Every day | ORAL | 5 refills | Status: DC

## 2023-11-27 MED ORDER — OXYCODONE-ACETAMINOPHEN 7.5-325 MG PO TABS: 1.0000 | ORAL_TABLET | Freq: Two times a day (BID) | ORAL | 0 refills | Status: AC | PRN

## 2023-11-27 NOTE — Progress Notes (Unsigned)
 PROVIDER NOTE: Interpretation of information contained herein should be left to medically-trained personnel. Specific patient instructions are provided elsewhere under "Patient Instructions" section of medical record. This document was created in part using AI and STT-dictation technology, any transcriptional errors that may result from this process are unintentional.  Patient: Brian [name]  Service: E/M   PCP: Bonnetta Barry, [facility]  DOB: [birth year]  DOS: 11/27/2023  Provider: Bettey Costa, NP  MRN: [id number]  Delivery: Face-to-face  Specialty: Interventional Pain Management  Type: Established Patient  Setting: Ambulatory outpatient facility  Specialty designation: 09  Referring Prov.: Mliss Sax,*  Location: Outpatient office facility       HPI  Brian [name], a 89 y.o. year old male, is here today because of his Chronic pain syndrome [G89.4]. Brian [name] primary complain today is Shoulder Pain (bilat) and Elbow Pain (left)  Pain Assessment: Severity of Chronic pain is reported as a 5 /10. Location: Shoulder Right, Left/denies. Onset: More than a month ago. Quality: Aching, Burning, Sharp. Timing: Constant. Modifying factor(s): meds. Vitals:  height is 5\' 6"  (1.676 m) and weight is 190 lb (86.2 kg). His temperature is 97.4 F (36.3 C) (abnormal). His blood pressure is 108/69 and his pulse is 52 (abnormal). His respiration is 16 and oxygen saturation is 98%.  BMI: Estimated body mass index is 30.67 kg/m as calculated from the following:   Height as of this encounter: 5\' 6"  (1.676 m).   Weight as of this encounter: 190 lb (86.2 kg). Last encounter: 09/04/2023. Last procedure: Visit date not found.  Reason for encounter: medication management The patient indicates doing well with the current medication regimen. No adverse reaction or side effects reported to the medication. The patient also reports left elbow pain which he attributes more to pressure from lying on the left side  rather than from the recent skin tear, which he reports is healing well.  Pharmacotherapy Assessment  Analgesic: Oxycodone-acetaminophen (Percocet) 7.5-325 MG tablet 2 times daily as needed for severe pain. MME=23  Monitoring: [place] PMP: PDMP not reviewed this encounter.       Pharmacotherapy: No side-effects or adverse reactions reported. Compliance: No problems identified. Effectiveness: Clinically acceptable.  Nonah Mattes, RN  11/27/2023  2:40 PM  Sign when Signing Visit Nursing Pain Medication Assessment:  Safety precautions to be maintained throughout the outpatient stay will include: orient to surroundings, keep bed in low position, maintain call bell within reach at all times, provide assistance with transfer out of bed and ambulation.  Medication Inspection Compliance: Brian [name] did not comply with our request to bring his pills to be counted. He was reminded that bringing the medication bottles, even when empty, is a requirement.  Medication: None brought in. Pill/Patch Count: None available to be counted. Bottle Appearance: No container available. Did not bring bottle(s) to appointment. Filled Date: N/A Last Medication intake:  Today    No results found for: "CBDTHCR" No results found for: "D8THCCBX" No results found for: "D9THCCBX"  UDS:  Summary  Date Value Ref Range Status  12/27/2021 Note  Final    Comment:    ==================================================================== Compliance Drug Analysis, Ur ==================================================================== Test                             Result       Flag       Units  Drug Present and Declared for Prescription Verification   Oxycodone  2038         EXPECTED   ng/mg creat   Oxymorphone                    6930         EXPECTED   ng/mg creat   Noroxycodone                   746          EXPECTED   ng/mg creat   Noroxymorphone                 488          EXPECTED   ng/mg creat     Sources of oxycodone are scheduled prescription medications.    Oxymorphone, noroxycodone, and noroxymorphone are expected    metabolites of oxycodone. Oxymorphone is also available as a    scheduled prescription medication.    Pregabalin                     PRESENT      EXPECTED   Sertraline                     PRESENT      EXPECTED   Desmethylsertraline            PRESENT      EXPECTED    Desmethylsertraline is an expected metabolite of sertraline.    Quetiapine                     PRESENT      EXPECTED   Acetaminophen                  PRESENT      EXPECTED  Drug Absent but Declared for Prescription Verification   Alprazolam                     Not Detected UNEXPECTED ng/mg creat   Metoprolol                     Not Detected UNEXPECTED ==================================================================== Test                      Result    Flag   Units      Ref Range   Creatinine              100              mg/dL      >=40 ==================================================================== Declared Medications:  The flagging and interpretation on this report are based on the  following declared medications.  Unexpected results may arise from  inaccuracies in the declared medications.   **Note: The testing scope of this panel includes these medications:   Alprazolam (Xanax)  Metoprolol  Oxycodone (Percocet)  Pregabalin (Lyrica)  Quetiapine (Seroquel)  Sertraline (Zoloft)   **Note: The testing scope of this panel does not include small to  moderate amounts of these reported medications:   Acetaminophen (Tylenol)  Acetaminophen (Percocet)   **Note: The testing scope of this panel does not include the  following reported medications:   Apixaban (Eliquis)  Cetirizine (Zyrtec)  Finasteride (Proscar)  Losartan (Cozaar)  Multivitamin  Pantoprazole (Protonix)  Polyethylene Glycol (MiraLAX)  Pravastatin  Tamsulosin  (Flomax) ==================================================================== For clinical consultation, please call [phone]. ====================================================================       ROS  Constitutional: Denies any fever  or chills Gastrointestinal: No reported hemesis, hematochezia, vomiting, or acute GI distress Musculoskeletal: Left Elbow pain due to pressure injury Neurological: No reported episodes of acute onset apraxia, aphasia, dysarthria, agnosia, amnesia, paralysis, loss of coordination, or loss of consciousness  Medication Review  ALPRAZolam, Mupirocin, QUEtiapine, apixaban, cetirizine, clotrimazole, finasteride, fluticasone, furosemide, gentamicin ointment, hydrocortisone ointment, melatonin, metoprolol succinate, multivitamin, oxyCODONE-acetaminophen, pantoprazole, polyethylene glycol powder, pravastatin, pregabalin, sertraline, and tamsulosin  History Review  Allergy: Brian [name] is allergic to novocain [procaine hcl], other, and penicillins. Drug: Brian [name]  reports no history of drug use. Alcohol:  reports no history of alcohol use. Tobacco:  reports that he has quit smoking. He has never used smokeless tobacco. Social: Brian [name]  reports that he has quit smoking. He has never used smokeless tobacco. He reports that he does not drink alcohol and does not use drugs. Medical:  has a past medical history of Anemia, Atrial flutter (HCC), BPH (benign prostatic hypertrophy), Epistaxis, Hemorrhage of gastrointestinal tract, unspecified, History of open heart surgery, Hypertension, Obesity (BMI 30.0-34.9) (06/29/2020), Osteoarthrosis, unspecified whether generalized or localized, unspecified site, Personal history of venous thrombosis and embolism, Rosacea, Stroke (HCC), and TIA (transient ischemic attack). Surgical: Mr. Baba  has a past surgical history that includes Appendectomy; Hernia repair; Total hip arthroplasty; Joint replacement; mvp repair; Mitral  valve repair; Radiology with anesthesia (N/A, 05/04/2016); ir generic historical (05/04/2016); ir generic historical (05/04/2016); and ir generic historical (06/07/2016). Family: family history includes Coronary artery disease in his father; Rheumatic fever in his mother.  Laboratory Chemistry Profile   Renal Lab Results  Component Value Date   BUN 14 11/13/2023   CREATININE 0.70 11/13/2023   BCR 26 (H) 04/24/2023   GFR 82.07 11/13/2023   GFRAA >60 11/03/2018   GFRNONAA >60 04/11/2023    Hepatic Lab Results  Component Value Date   AST 18 11/13/2023   ALT 13 11/13/2023   ALBUMIN 4.2 11/13/2023   ALKPHOS 76 11/13/2023   LIPASE 23 03/14/2022    Electrolytes Lab Results  Component Value Date   NA 138 11/13/2023   K 4.5 11/13/2023   CL 102 11/13/2023   CALCIUM 9.0 11/13/2023   MG 1.7 03/15/2017   PHOS 2.4 (L) 05/07/2016    Bone No results found for: "VD25OH", "VD125OH2TOT", "OZ3086VH8", "IO9629BM8", "25OHVITD1", "25OHVITD2", "25OHVITD3", "TESTOFREE", "TESTOSTERONE"  Inflammation (CRP: Acute Phase) (ESR: Chronic Phase) Lab Results  Component Value Date   LATICACIDVEN 1.9 03/15/2022         Note: Above Lab results reviewed.  Recent Imaging Review  CT Angio Chest PE W and/or Wo Contrast CLINICAL DATA:  Pulmonary embolism (PE) suspected, high prob. Intermittent shortness of breath. Pain to left flank. No known injury.  EXAM: CT ANGIOGRAPHY CHEST WITH CONTRAST  TECHNIQUE: Multidetector CT imaging of the chest was performed using the standard protocol during bolus administration of intravenous contrast. Multiplanar CT image reconstructions and MIPs were obtained to evaluate the vascular anatomy.  RADIATION DOSE REDUCTION: This exam was performed according to the departmental dose-optimization program which includes automated exposure control, adjustment of the mA and/or kV according to patient size and/or use of iterative reconstruction technique.  CONTRAST:  75mL  OMNIPAQUE IOHEXOL 350 MG/ML SOLN  COMPARISON:  CT angiography chest from 03/15/2022.  FINDINGS: Cardiovascular: No evidence of embolism to the proximal subsegmental pulmonary artery level.  Mild cardiomegaly. No pericardial effusion. No aortic aneurysm. There are minimal coronary artery and aortic artery atherosclerotic vascular calcifications. Prosthetic mitral valve noted.  Mediastinum/Nodes: Visualized thyroid gland appears  grossly unremarkable. No solid / cystic mediastinal masses. The esophagus is nondistended precluding optimal assessment. No axillary, mediastinal or hilar lymphadenopathy by size criteria.  Lungs/Pleura: The central tracheo-bronchial tree is patent. There is small left and trace right pleural effusion. There are associated compressive atelectatic changes in the bilateral lower lobes. No mass, consolidation or pneumothorax. No suspicious lung nodules.  Upper Abdomen: Redemonstration of several hypoattenuating structures in the liver without significant interval change. The largest such structure is in the left hepatic lobe, segment 4B measuring up to 1.6 x 2.0 cm. Remaining visualized upper abdominal viscera within normal limits.  Musculoskeletal: Sternotomy wires noted. The visualized soft tissues of the chest wall are grossly unremarkable. No suspicious osseous lesions. There are mild multilevel degenerative changes in the visualized spine. A hemangioma is noted in the T3 vertebral body.  Review of the MIP images confirms the above findings.  IMPRESSION: 1. No evidence of pulmonary embolism. 2. Small left and trace right pleural effusions with associated compressive atelectatic changes in the bilateral lower lobes. 3. Multiple other nonacute observations, as described above.  Aortic Atherosclerosis (ICD10-I70.0).  Electronically Signed   By: Jules Schick M.D.   On: 04/11/2023 15:59 DG Chest Port 1 View CLINICAL DATA:  CP/SOB  EXAM: PORTABLE  CHEST 1 VIEW  COMPARISON:  CXR 03/14/22  FINDINGS: Cardiomegaly. Small left pleural effusion. No pneumothorax. Status post median sternotomy. No focal airspace opacity. Prominent bilateral interstitial opacities could represent pulmonary venous congestion or atypical infection. No radiographically apparent displaced rib fractures. Visualized upper abdomen is unremarkable.  IMPRESSION: 1. Cardiomegaly with small left pleural effusion. 2. Prominent bilateral interstitial opacities could represent pulmonary venous congestion or atypical infection.  Electronically Signed   By: Lorenza Cambridge M.D.   On: 04/11/2023 13:47 Note: Reviewed        Physical Exam  General appearance: Well nourished, well developed, and well hydrated. In no apparent acute distress Mental status: Alert, oriented x 3 (person, place, & time)       Respiratory: No evidence of acute respiratory distress Eyes: PERLA Vitals: BP 108/69   Pulse (!) 52   Temp (!) 97.4 F (36.3 C)   Resp 16   Ht 5\' 6"  (1.676 m)   Wt 190 lb (86.2 kg)   SpO2 98%   BMI 30.67 kg/m  BMI: Estimated body mass index is 30.67 kg/m as calculated from the following:   Height as of this encounter: 5\' 6"  (1.676 m).   Weight as of this encounter: 190 lb (86.2 kg). Ideal: Ideal body weight: 63.8 kg (140 lb 10.5 oz) Adjusted ideal body weight: 72.8 kg (160 lb 6.3 oz)  Assessment   Diagnosis Status  1. Chronic pain syndrome   2. Degeneration of intervertebral disc of lumbar region with discogenic back pain   3. Chronic radicular lumbar pain   4. Primary osteoarthritis of right knee   5. Lumbar spondylosis    Controlled Controlled Controlled   Plan of Care  Problem-specific:  Assessment and Plan The patient indicates doing well with the current medication regimen. No adverse reaction or side effects reported to the medication. He consider his left elbow discomfort due to a pressure-related injury rather than actual pain.   Brian [name] has a current medication list which includes the following long-term medication(s): cetirizine, eliquis, furosemide, metoprolol succinate, pantoprazole, pravastatin, pregabalin, quetiapine, and sertraline.  Pharmacotherapy (Medications Ordered): Meds ordered this encounter  Medications   oxyCODONE-acetaminophen (PERCOCET) 7.5-325 MG tablet    Sig:  Take 1 tablet by mouth 2 (two) times daily as needed for severe pain (pain score 7-10).    Dispense:  60 tablet    Refill:  0   oxyCODONE-acetaminophen (PERCOCET) 7.5-325 MG tablet    Sig: Take 1 tablet by mouth 2 (two) times daily as needed for severe pain (pain score 7-10).    Dispense:  60 tablet    Refill:  0   oxyCODONE-acetaminophen (PERCOCET) 7.5-325 MG tablet    Sig: Take 1 tablet by mouth 2 (two) times daily as needed for severe pain (pain score 7-10).    Dispense:  60 tablet    Refill:  0   pregabalin (LYRICA) 25 MG capsule    Sig: Take 2 capsules (50 mg total) by mouth at bedtime.    Dispense:  30 capsule    Refill:  5   Orders:  No orders of the defined types were placed in this encounter.  Follow-up plan:   Return in 3 months (on 02/26/2024) for (F2F), (MM), Randalyn Rhea NP, PATIENT REQUEST A TUESDAY.    Recent Visits Date Type Provider Dept  09/04/23 Office Visit Edward Jolly, MD [facility]  Showing recent visits within past 90 days and meeting all other requirements Today's Visits Date Type Provider Dept  11/27/23 Office Visit Edward Jolly, MD [facility]  Showing today's visits and meeting all other requirements Future Appointments Date Type Provider Dept  02/19/24 Appointment Bettey Costa, NP [facility]  Showing future appointments within next 90 days and meeting all other requirements  I discussed the assessment and treatment plan with the patient. The patient was provided an opportunity to ask questions and all were answered. The patient agreed with the plan and demonstrated  an understanding of the instructions.  Patient advised to call back or seek an in-person evaluation if the symptoms or condition worsens.  Duration of encounter: 30 minutes.  Total time on encounter, as per AMA guidelines included both the face-to-face and non-face-to-face time personally spent by the physician and/or other qualified health care professional(s) on the day of the encounter (includes time in activities that require the physician or other qualified health care professional and does not include time in activities normally performed by clinical staff). Physician's time may include the following activities when performed: Preparing to see the patient (e.g., pre-charting review of records, searching for previously ordered imaging, lab work, and nerve conduction tests) Review of prior analgesic pharmacotherapies. Reviewing PMP Interpreting ordered tests (e.g., lab work, imaging, nerve conduction tests) Performing post-procedure evaluations, including interpretation of diagnostic procedures Obtaining and/or reviewing separately obtained history Performing a medically appropriate examination and/or evaluation Counseling and educating the patient/family/caregiver Ordering medications, tests, or procedures Referring and communicating with other health care professionals (when not separately reported) Documenting clinical information in the electronic or other health record Independently interpreting results (not separately reported) and communicating results to the patient/ family/caregiver Care coordination (not separately reported)  Note by: Bettey Costa, NP (TTS and AI technology used. I apologize for any typographical errors that were not detected and corrected.) Date: 11/27/2023; Time: 4:07 PM

## 2023-11-27 NOTE — Progress Notes (Unsigned)
Nursing Pain Medication Assessment:  Safety precautions to be maintained throughout the outpatient stay will include: orient to surroundings, keep bed in low position, maintain call bell within reach at all times, provide assistance with transfer out of bed and ambulation.  Medication Inspection Compliance: Brian [name] did not comply with our request to bring his pills to be counted. He was reminded that bringing the medication bottles, even when empty, is a requirement.  Medication: None brought in. Pill/Patch Count: None available to be counted. Bottle Appearance: No container available. Did not bring bottle(s) to appointment. Filled Date: N/A Last Medication intake:  Today 

## 2023-11-29 ENCOUNTER — Encounter: Payer: Medicare Other | Admitting: Student in an Organized Health Care Education/Training Program

## 2023-12-01 ENCOUNTER — Other Ambulatory Visit: Payer: Self-pay | Admitting: Family Medicine

## 2023-12-01 DIAGNOSIS — F419 Anxiety disorder, unspecified: Secondary | ICD-10-CM

## 2023-12-07 ENCOUNTER — Ambulatory Visit: Payer: Self-pay

## 2023-12-07 NOTE — Telephone Encounter (Signed)
 Chief Complaint: Bruising on feet, hands, and wrist   Symptoms: Bruising Frequency: Pt states he noticed it recently Pertinent Negatives: Patient denies bleeding gums, blood in stool/urine, dizziness  Disposition: [x]  Follow-up with PCP  Additional Notes: Pt states he is on Eliquis  2.5 bid and has recently noticed some bruising. Per his caregiver, the bruising is "not that bad" and healing. Pt wants to discuss with his doctor the bruising and dose of Eliquis  medication. This RN educated pt on new-worsening symptoms and when to call back/seek emergent care. Pt verbalized understanding and agrees to plan.    Copied from CRM [id number]. Topic: Clinical - Medical Advice >> Dec 07, 2023  5:30 PM Chuck Crater wrote: Reason for CRM: Patient is taking eliquis  twice a day and he has been getting bruises on his arm and elbow. Patient think he is taking too much Eliquis . Please call patient. Reason for Disposition  [1] Not caused by an injury AND [2] < 5 unexplained bruises  Answer Assessment - Initial Assessment Questions Chief Complaint: Bruising on feet, hands, and wrist   Symptoms: Bruising  Frequency: Patient states he noticed it recently  Pertinent Negatives: Patient denies bleeding gums, blood in stool/urine, dizziness  Protocols used: Bruises-A-AH

## 2023-12-10 NOTE — Telephone Encounter (Signed)
 Copied from CRM [id number]. Topic: Clinical - Medical Advice >> Dec 10, 2023  8:45 AM Albertha Alosa wrote: Reason for CRM: Patient son called in stating patient has been taking pregabalin  (LYRICA ) 25 MG capsule , and stated he now has a bruise on his foot, doesn't know if he came from the mediation or what. Also stated that he is dealing with some sinus issues and wanted to know if Dr.Kramer can send over some doxycycline  (VIBRA -TABS) 100 MG tablet as it had help before

## 2023-12-10 NOTE — Telephone Encounter (Signed)
 Noted.

## 2023-12-14 ENCOUNTER — Other Ambulatory Visit: Payer: Self-pay | Admitting: Family Medicine

## 2023-12-14 DIAGNOSIS — J301 Allergic rhinitis due to pollen: Secondary | ICD-10-CM

## 2023-12-18 ENCOUNTER — Encounter (HOSPITAL_BASED_OUTPATIENT_CLINIC_OR_DEPARTMENT_OTHER): Attending: General Surgery | Admitting: General Surgery

## 2023-12-18 DIAGNOSIS — L89023 Pressure ulcer of left elbow, stage 3: Secondary | ICD-10-CM | POA: Diagnosis present

## 2023-12-18 DIAGNOSIS — I2510 Atherosclerotic heart disease of native coronary artery without angina pectoris: Secondary | ICD-10-CM | POA: Diagnosis not present

## 2023-12-18 DIAGNOSIS — I6300 Cerebral infarction due to thrombosis of unspecified precerebral artery: Secondary | ICD-10-CM | POA: Diagnosis not present

## 2023-12-18 DIAGNOSIS — I4819 Other persistent atrial fibrillation: Secondary | ICD-10-CM | POA: Diagnosis not present

## 2023-12-18 DIAGNOSIS — L98411 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: Secondary | ICD-10-CM | POA: Insufficient documentation

## 2023-12-18 DIAGNOSIS — L98412 Non-pressure chronic ulcer of buttock with fat layer exposed: Secondary | ICD-10-CM | POA: Diagnosis not present

## 2023-12-25 ENCOUNTER — Encounter: Payer: Self-pay | Admitting: Family Medicine

## 2023-12-25 ENCOUNTER — Ambulatory Visit (INDEPENDENT_AMBULATORY_CARE_PROVIDER_SITE_OTHER): Admitting: Family Medicine

## 2023-12-25 VITALS — BP 106/68 | HR 92 | Temp 98.1°F | Ht 66.0 in

## 2023-12-25 DIAGNOSIS — D509 Iron deficiency anemia, unspecified: Secondary | ICD-10-CM | POA: Diagnosis not present

## 2023-12-25 DIAGNOSIS — R1011 Right upper quadrant pain: Secondary | ICD-10-CM

## 2023-12-25 DIAGNOSIS — K5903 Drug induced constipation: Secondary | ICD-10-CM | POA: Diagnosis not present

## 2023-12-25 NOTE — Progress Notes (Signed)
 Established Patient Office Visit   Subjective:  Patient ID: Brian [name], male    DOB: 13-Feb-1933  Age: 89 y.o. MRN: [id number]  Chief Complaint  Patient presents with   Bleeding/Bruising    Hand and feel bruising.     HPI Encounter Diagnoses  Name Primary?   Drug-induced constipation Yes   Right upper quadrant abdominal pain    1 to 2-week history of right upper quadrant pain.  There has been no fevers chills nausea or vomiting.  GERD symptoms are well-controlled with pantoprazole .  Ongoing constipation associated with chronic oxycodone  usage for chronic pain.  There is been no blood or pus in his stool.  Denies dysuria or frequency.   Review of Systems  Constitutional: Negative.   HENT: Negative.    Eyes:  Negative for blurred vision, discharge and redness.  Respiratory: Negative.    Cardiovascular: Negative.   Gastrointestinal:  Positive for abdominal pain and constipation. Negative for blood in stool, diarrhea, heartburn, melena, nausea and vomiting.  Genitourinary: Negative.  Negative for dysuria, frequency and urgency.  Musculoskeletal: Negative.  Negative for myalgias.  Skin:  Negative for rash.  Neurological:  Negative for tingling, loss of consciousness and weakness.  Endo/Heme/Allergies:  Negative for polydipsia.     Current Outpatient Medications:    ALPRAZolam  (XANAX ) 0.25 MG tablet, Take 0.25 mg by mouth 2 (two) times daily as needed for anxiety., Disp: , Rfl:    cetirizine  (ZYRTEC ) 10 MG tablet, TAKE 1 TABLET(10 MG) BY MOUTH AT BEDTIME, Disp: 90 tablet, Rfl: 2   clotrimazole  (LOTRIMIN ) 1 % cream, Apply 1 Application topically 2 (two) times daily., Disp: , Rfl:    ELIQUIS  2.5 MG TABS tablet, TAKE 1 TABLET(2.5 MG) BY MOUTH TWICE DAILY, Disp: 180 tablet, Rfl: 1   finasteride  (PROSCAR ) 5 MG tablet, TAKE 1 TABLET(5 MG) BY MOUTH DAILY, Disp: 90 tablet, Rfl: 3   fluticasone (FLONASE) 50 MCG/ACT nasal spray, Place 1 spray into both nostrils daily as needed for  allergies., Disp: , Rfl:    furosemide  (LASIX ) 40 MG tablet, Take 1 tablet (40 mg total) by mouth daily., Disp: 90 tablet, Rfl: 3   gentamicin ointment (GARAMYCIN) 0.1 %, Apply topically as needed (pressure sore)., Disp: , Rfl:    hydrocortisone  ointment 0.5 %, Apply 1 Application topically daily as needed for itching., Disp: , Rfl:    melatonin 5 MG TABS, Take 5 mg by mouth at bedtime as needed (sleep)., Disp: , Rfl:    metoprolol  succinate (TOPROL -XL) 25 MG 24 hr tablet, Take 1 tablet (25 mg total) by mouth daily., Disp: 90 tablet, Rfl: 3   Multiple Vitamin (MULTIVITAMIN) tablet, Take 1 tablet by mouth 2 (two) times daily., Disp: , Rfl:    MUPIROCIN EX, Apply 1 Application topically daily as needed (wound care)., Disp: , Rfl:    oxyCODONE -acetaminophen  (PERCOCET) 7.5-325 MG tablet, Take 1 tablet by mouth 2 (two) times daily as needed for severe pain (pain score 7-10)., Disp: 60 tablet, Rfl: 0   [START ON 01/02/2024] oxyCODONE -acetaminophen  (PERCOCET) 7.5-325 MG tablet, Take 1 tablet by mouth 2 (two) times daily as needed for severe pain (pain score 7-10)., Disp: 60 tablet, Rfl: 0   [START ON 02/01/2024] oxyCODONE -acetaminophen  (PERCOCET) 7.5-325 MG tablet, Take 1 tablet by mouth 2 (two) times daily as needed for severe pain (pain score 7-10)., Disp: 60 tablet, Rfl: 0   pantoprazole  (PROTONIX ) 40 MG tablet, Take 1 tablet (40 mg total) by mouth daily., Disp: 90 tablet, Rfl: 3  polyethylene glycol powder (GLYCOLAX /MIRALAX ) 17 GM/SCOOP powder, Mix one scoop with water and take daily until stools loosen. May repeat as needed. (Patient taking differently: Take 17 g by mouth daily as needed for mild constipation.), Disp: 850 g, Rfl: 1   pravastatin  (PRAVACHOL ) 40 MG tablet, TAKE 1 TABLET(40 MG) BY MOUTH DAILY, Disp: 90 tablet, Rfl: 3   pregabalin  (LYRICA ) 25 MG capsule, Take 2 capsules (50 mg total) by mouth at bedtime., Disp: 30 capsule, Rfl: 5   QUEtiapine  (SEROQUEL ) 50 MG tablet, TAKE 1 TABLET(50 MG) BY  MOUTH AT BEDTIME, Disp: 90 tablet, Rfl: 3   sertraline  (ZOLOFT ) 50 MG tablet, TAKE 1 TABLET(50 MG) BY MOUTH EVERY MORNING, Disp: 90 tablet, Rfl: 3   tamsulosin  (FLOMAX ) 0.4 MG CAPS capsule, TAKE 1 CAPSULE(0.4 MG) BY MOUTH DAILY, Disp: 90 capsule, Rfl: 3   Objective:     BP 106/68 (Cuff Size: Normal)   Pulse 92   Temp 98.1 F (36.7 C) (Temporal)   Ht 5\' 6"  (1.676 m)   SpO2 96%   BMI 30.67 kg/m    Physical Exam Constitutional:      General: He is not in acute distress.    Appearance: Normal appearance. He is overweight. He is not ill-appearing, toxic-appearing or diaphoretic.  HENT:     Head: Normocephalic and atraumatic.     Right Ear: External ear normal.     Left Ear: External ear normal.     Mouth/Throat:     Mouth: Mucous membranes are moist.     Pharynx: Oropharynx is clear. No oropharyngeal exudate or posterior oropharyngeal erythema.  Eyes:     General: No scleral icterus.       Right eye: No discharge.        Left eye: No discharge.     Extraocular Movements: Extraocular movements intact.     Conjunctiva/sclera: Conjunctivae normal.     Pupils: Pupils are equal, round, and reactive to light.  Cardiovascular:     Rate and Rhythm: Normal rate and regular rhythm.     Pulses:          Dorsalis pedis pulses are 2+ on the left side.       Posterior tibial pulses are 2+ on the left side.  Pulmonary:     Effort: Pulmonary effort is normal. No respiratory distress.     Breath sounds: Normal breath sounds.  Abdominal:     General: Bowel sounds are normal.     Palpations: Abdomen is soft.     Tenderness: There is no abdominal tenderness. There is no guarding.  Musculoskeletal:     Cervical back: No rigidity or tenderness.     Left foot: Deformity (Hallux deformity of great toe) present.  Feet:     Left foot:     Skin integrity: Skin integrity normal.     Toenail Condition: Fungal disease present. Skin:    General: Skin is warm and dry.  Neurological:     Mental  Status: He is alert and oriented to person, place, and time.  Psychiatric:        Mood and Affect: Mood normal.        Behavior: Behavior normal.      No results found for any visits on 12/25/23.    The ASCVD Risk score (Arnett DK, et al., 2019) failed to calculate for the following reasons:   The 2019 ASCVD risk score is only valid for ages 79 to 23   Risk score cannot be calculated  because patient has a medical history suggesting prior/existing ASCVD    Assessment & Plan:   Drug-induced constipation  Right upper quadrant abdominal pain -     CBC -     Comprehensive metabolic panel with GFR -     Amylase -     Lipase    Return To emergency room if worsening.  To emergency room if worsening.  Further workup will depend on lab results.  Could consider podiatry referral for toenail trim.  May use MiraLAX  twice daily.  Tonna Frederic, MD

## 2023-12-26 LAB — COMPREHENSIVE METABOLIC PANEL WITH GFR
ALT: 25 U/L (ref 0–53)
AST: 28 U/L (ref 0–37)
Albumin: 3.9 g/dL (ref 3.5–5.2)
Alkaline Phosphatase: 82 U/L (ref 39–117)
BUN: 16 mg/dL (ref 6–23)
CO2: 26 meq/L (ref 19–32)
Calcium: 8.7 mg/dL (ref 8.4–10.5)
Chloride: 105 meq/L (ref 96–112)
Creatinine, Ser: 0.8 mg/dL (ref 0.40–1.50)
GFR: 78.76 mL/min (ref >60.00)
Glucose, Bld: 86 mg/dL (ref 70–99)
Potassium: 4.6 meq/L (ref 3.5–5.1)
Sodium: 140 meq/L (ref 135–145)
Total Bilirubin: 0.4 mg/dL (ref 0.2–1.2)
Total Protein: 6.2 g/dL (ref 6.0–8.3)

## 2023-12-26 LAB — CBC
HCT: 32.1 % — ABNORMAL LOW (ref 39.0–52.0)
Hemoglobin: 9.8 g/dL — ABNORMAL LOW (ref 13.0–17.0)
MCHC: 30.4 g/dL (ref 30.0–36.0)
MCV: 79 fl (ref 78.0–100.0)
Platelets: 199 10*3/uL (ref 150.0–400.0)
RBC: 4.06 Mil/uL — ABNORMAL LOW (ref 4.22–5.81)
RDW: 18.1 % — ABNORMAL HIGH (ref 11.5–15.5)
WBC: 6.6 10*3/uL (ref 4.0–10.5)

## 2023-12-26 LAB — LIPASE: Lipase: 14 U/L (ref 11.0–59.0)

## 2023-12-26 LAB — AMYLASE: Amylase: 50 U/L (ref 27–131)

## 2023-12-27 NOTE — Addendum Note (Signed)
 Addended by: Delene Feinstein on: 12/27/2023 08:23 AM   Modules accepted: Orders

## 2024-01-04 ENCOUNTER — Ambulatory Visit: Payer: Self-pay

## 2024-01-04 NOTE — Telephone Encounter (Signed)
 Copied from CRM [id number]. Topic: Clinical - Red Word Triage >> Jan 04, 2024  7:41 AM Brian [name] wrote: [facility] that prompted transfer to Nurse Triage: Patient is having pain in left side of Liver   Chief Complaint: Abdominal Pain, RUQ Symptoms: Abdominal Pain,  Frequency: One Week  Pertinent Negatives: Patient denies fever, jaundice,   Disposition: [] ED /[] Urgent Care (no appt availability in office) / [x] Appointment(In office/virtual)/ []  [name]/ [] Home Care/ [] Refused Recommended Disposition /[] [facility]/ []  Follow-up with PCP Additional Notes: Brian [name] is being triaged for abdominal pain that he believes that he believes in located in his liver. Spoke with son for triage, the patient does not have any yellowing of his eyes or [name] fever. The pain does not radiate or move. In office appointment made per the patient's and son's preference. Provided education and care advice. Verbalized understanding and agreed to disposition.   Reason for Disposition  [1] MODERATE pain (e.g., interferes with normal activities) AND [2] pain comes and goes (cramps) AND [3] present > 24 hours  (Exception: Pain with Vomiting or Diarrhea - see that Guideline.)  Answer Assessment - Initial Assessment Questions 1. LOCATION: "Where does it hurt?"      Right Upper Quadrant  2. RADIATION: "Does the pain shoot anywhere else?" (e.g., chest, back)     No  3. ONSET: "When did the pain begin?" (Minutes, hours or days ago)      Since This Past Tuesday  4. SUDDEN: "Gradual or sudden onset?"     Gradual  5. PATTERN "Does the pain come and go, or is it constant?"    - If it comes and goes: "How long does it last?" "Do you have pain now?"     (Note: Comes and goes means the pain is intermittent. It goes away completely between bouts.)    - If constant: "Is it getting better, staying the same, or getting worse?"      (Note: Constant means the pain never goes away completely; most serious pain is  constant and gets worse.)      Constant  6. SEVERITY: "How bad is the pain?"  (e.g., Scale 1-10; mild, moderate, or severe)    - MILD (1-3): Doesn't interfere with normal activities, abdomen soft and not tender to touch.     - MODERATE (4-7): Interferes with normal activities or awakens from sleep, abdomen tender to touch.     - SEVERE (8-10): Excruciating pain, doubled over, unable to do any normal activities.       8  7. RECURRENT SYMPTOM: "Have you ever had this type of stomach pain before?" If Yes, ask: "When was the last time?" and "What happened that time?"      No  8. CAUSE: "What do you think is causing the stomach pain?"     Unsure, believes  9. RELIEVING/AGGRAVATING FACTORS: "What makes it better or worse?" (e.g., antacids, bending or twisting motion, bowel movement)     Tender to touch  10. OTHER SYMPTOMS: "Do you have any other symptoms?" (e.g., back pain, diarrhea, fever, urination pain, vomiting)       Constipation  Protocols used: Abdominal Pain - Male-[name]-AH

## 2024-01-08 ENCOUNTER — Emergency Department (HOSPITAL_COMMUNITY)

## 2024-01-08 ENCOUNTER — Other Ambulatory Visit (HOSPITAL_COMMUNITY)

## 2024-01-08 ENCOUNTER — Inpatient Hospital Stay (HOSPITAL_COMMUNITY)
Admission: EM | Admit: 2024-01-08 | Discharge: 2024-01-11 | DRG: 291 | Disposition: A | Discharge status: Home / Self Care | Attending: Internal Medicine | Admitting: Internal Medicine

## 2024-01-08 ENCOUNTER — Encounter (HOSPITAL_COMMUNITY): Payer: Self-pay | Admitting: Emergency Medicine

## 2024-01-08 ENCOUNTER — Other Ambulatory Visit: Payer: Self-pay

## 2024-01-08 ENCOUNTER — Ambulatory Visit: Payer: Self-pay

## 2024-01-08 ENCOUNTER — Encounter: Admitting: Family Medicine

## 2024-01-08 DIAGNOSIS — B961 Klebsiella pneumoniae [K. pneumoniae] as the cause of diseases classified elsewhere: Secondary | ICD-10-CM | POA: Diagnosis present

## 2024-01-08 DIAGNOSIS — I2510 Atherosclerotic heart disease of native coronary artery without angina pectoris: Secondary | ICD-10-CM | POA: Diagnosis present

## 2024-01-08 DIAGNOSIS — E782 Mixed hyperlipidemia: Secondary | ICD-10-CM | POA: Diagnosis present

## 2024-01-08 DIAGNOSIS — I4819 Other persistent atrial fibrillation: Secondary | ICD-10-CM | POA: Diagnosis present

## 2024-01-08 DIAGNOSIS — I5A Non-ischemic myocardial injury (non-traumatic): Secondary | ICD-10-CM | POA: Diagnosis present

## 2024-01-08 DIAGNOSIS — R54 Age-related physical debility: Secondary | ICD-10-CM | POA: Diagnosis present

## 2024-01-08 DIAGNOSIS — Z8249 Family history of ischemic heart disease and other diseases of the circulatory system: Secondary | ICD-10-CM

## 2024-01-08 DIAGNOSIS — Z87891 Personal history of nicotine dependence: Secondary | ICD-10-CM

## 2024-01-08 DIAGNOSIS — Z8711 Personal history of peptic ulcer disease: Secondary | ICD-10-CM

## 2024-01-08 DIAGNOSIS — Z8673 Personal history of transient ischemic attack (TIA), and cerebral infarction without residual deficits: Secondary | ICD-10-CM

## 2024-01-08 DIAGNOSIS — G894 Chronic pain syndrome: Secondary | ICD-10-CM | POA: Diagnosis present

## 2024-01-08 DIAGNOSIS — R0602 Shortness of breath: Secondary | ICD-10-CM | POA: Diagnosis present

## 2024-01-08 DIAGNOSIS — N3000 Acute cystitis without hematuria: Secondary | ICD-10-CM | POA: Diagnosis present

## 2024-01-08 DIAGNOSIS — J9601 Acute respiratory failure with hypoxia: Secondary | ICD-10-CM | POA: Diagnosis present

## 2024-01-08 DIAGNOSIS — I4892 Unspecified atrial flutter: Secondary | ICD-10-CM | POA: Diagnosis present

## 2024-01-08 DIAGNOSIS — Z952 Presence of prosthetic heart valve: Secondary | ICD-10-CM

## 2024-01-08 DIAGNOSIS — I2489 Other forms of acute ischemic heart disease: Secondary | ICD-10-CM | POA: Diagnosis present

## 2024-01-08 DIAGNOSIS — I447 Left bundle-branch block, unspecified: Secondary | ICD-10-CM | POA: Diagnosis present

## 2024-01-08 DIAGNOSIS — I10 Essential (primary) hypertension: Secondary | ICD-10-CM | POA: Diagnosis not present

## 2024-01-08 DIAGNOSIS — Z79899 Other long term (current) drug therapy: Secondary | ICD-10-CM | POA: Diagnosis not present

## 2024-01-08 DIAGNOSIS — I5043 Acute on chronic combined systolic (congestive) and diastolic (congestive) heart failure: Secondary | ICD-10-CM | POA: Insufficient documentation

## 2024-01-08 DIAGNOSIS — J811 Chronic pulmonary edema: Secondary | ICD-10-CM

## 2024-01-08 DIAGNOSIS — R7989 Other specified abnormal findings of blood chemistry: Secondary | ICD-10-CM | POA: Diagnosis not present

## 2024-01-08 DIAGNOSIS — N400 Enlarged prostate without lower urinary tract symptoms: Secondary | ICD-10-CM | POA: Diagnosis present

## 2024-01-08 DIAGNOSIS — E876 Hypokalemia: Secondary | ICD-10-CM | POA: Diagnosis present

## 2024-01-08 DIAGNOSIS — Z7901 Long term (current) use of anticoagulants: Secondary | ICD-10-CM

## 2024-01-08 DIAGNOSIS — I509 Heart failure, unspecified: Secondary | ICD-10-CM | POA: Diagnosis not present

## 2024-01-08 DIAGNOSIS — Z7401 Bed confinement status: Secondary | ICD-10-CM | POA: Diagnosis not present

## 2024-01-08 DIAGNOSIS — Z8679 Personal history of other diseases of the circulatory system: Secondary | ICD-10-CM | POA: Diagnosis not present

## 2024-01-08 DIAGNOSIS — I5033 Acute on chronic diastolic (congestive) heart failure: Secondary | ICD-10-CM | POA: Diagnosis not present

## 2024-01-08 DIAGNOSIS — Z880 Allergy status to penicillin: Secondary | ICD-10-CM

## 2024-01-08 DIAGNOSIS — Z96649 Presence of unspecified artificial hip joint: Secondary | ICD-10-CM | POA: Diagnosis present

## 2024-01-08 DIAGNOSIS — Z86718 Personal history of other venous thrombosis and embolism: Secondary | ICD-10-CM

## 2024-01-08 DIAGNOSIS — R0789 Other chest pain: Secondary | ICD-10-CM

## 2024-01-08 DIAGNOSIS — E669 Obesity, unspecified: Secondary | ICD-10-CM | POA: Diagnosis present

## 2024-01-08 DIAGNOSIS — D649 Anemia, unspecified: Secondary | ICD-10-CM | POA: Diagnosis present

## 2024-01-08 DIAGNOSIS — Z6830 Body mass index (BMI) 30.0-30.9, adult: Secondary | ICD-10-CM

## 2024-01-08 DIAGNOSIS — I110 Hypertensive heart disease with heart failure: Principal | ICD-10-CM | POA: Diagnosis present

## 2024-01-08 LAB — CBC
HCT: 32.9 % — ABNORMAL LOW (ref 39.0–52.0)
Hemoglobin: 9.7 g/dL — ABNORMAL LOW (ref 13.0–17.0)
MCH: 23.8 pg — ABNORMAL LOW (ref 26.0–34.0)
MCHC: 29.5 g/dL — ABNORMAL LOW (ref 30.0–36.0)
MCV: 80.6 fL (ref 80.0–100.0)
Platelets: 209 10*3/uL (ref 150–400)
RBC: 4.08 MIL/uL — ABNORMAL LOW (ref 4.22–5.81)
RDW: 17.1 % — ABNORMAL HIGH (ref 11.5–15.5)
WBC: 9.2 10*3/uL (ref 4.0–10.5)
nRBC: 0 % (ref 0.0–0.2)

## 2024-01-08 LAB — URINALYSIS, W/ REFLEX TO CULTURE (INFECTION SUSPECTED)
Bilirubin Urine: NEGATIVE
Glucose, UA: NEGATIVE mg/dL
Ketones, ur: 20 mg/dL — AB
Nitrite: POSITIVE — AB
Protein, ur: 30 mg/dL — AB
Specific Gravity, Urine: 1.016 (ref 1.005–1.030)
WBC, UA: >50 WBC/hpf (ref 0–5)
pH: 5 (ref 5.0–8.0)

## 2024-01-08 LAB — BASIC METABOLIC PANEL WITH GFR
Anion gap: 10 (ref 5–15)
BUN: 13 mg/dL (ref 8–23)
CO2: 25 mmol/L (ref 22–32)
Calcium: 8.9 mg/dL (ref 8.9–10.3)
Chloride: 103 mmol/L (ref 98–111)
Creatinine, Ser: 0.94 mg/dL (ref 0.61–1.24)
GFR, Estimated: >60 mL/min (ref >60)
Glucose, Bld: 107 mg/dL — ABNORMAL HIGH (ref 70–99)
Potassium: 4.3 mmol/L (ref 3.5–5.1)
Sodium: 138 mmol/L (ref 135–145)

## 2024-01-08 LAB — TROPONIN I (HIGH SENSITIVITY)
Troponin I (High Sensitivity): 59 ng/L — ABNORMAL HIGH (ref <18)
Troponin I (High Sensitivity): 59 ng/L — ABNORMAL HIGH (ref <18)

## 2024-01-08 LAB — MAGNESIUM: Magnesium: 1.8 mg/dL (ref 1.7–2.4)

## 2024-01-08 LAB — BRAIN NATRIURETIC PEPTIDE: B Natriuretic Peptide: 640.6 pg/mL — ABNORMAL HIGH (ref 0.0–100.0)

## 2024-01-08 MED ORDER — METOPROLOL TARTRATE 5 MG/5ML IV SOLN
5.0000 mg | Freq: Once | INTRAVENOUS | Status: AC
  Administered 2024-01-08: 5 mg via INTRAVENOUS
  Filled 2024-01-08: qty 5

## 2024-01-08 MED ORDER — IOHEXOL 350 MG/ML SOLN
58.0000 mL | Freq: Once | INTRAVENOUS | Status: AC | PRN
  Administered 2024-01-08: 58 mL via INTRAVENOUS

## 2024-01-08 MED ORDER — SODIUM CHLORIDE 0.9 % IV SOLN
1.0000 g | Freq: Once | INTRAVENOUS | Status: AC
  Administered 2024-01-08: 1 g via INTRAVENOUS
  Filled 2024-01-08: qty 10

## 2024-01-08 NOTE — ED Provider Triage Note (Signed)
 Emergency Medicine Provider Triage Evaluation Note  Brian [name] , a 89 y.o. male  was evaluated in triage.  Pt complains of 1 day of left sided CP, SOB, generalized weakness. Afib rvr in triage, going to room.   Review of Systems  Positive: CP Negative: fever  Physical Exam  BP (!) 148/81   Pulse 94   Temp 98.5 F (36.9 C) (Oral)   Resp 20   SpO2 97%  Gen:   Awake, no distress   Resp:  Normal effort  MSK:   Moves extremities without difficulty  Other:    Medical Decision Making  Medically screening exam initiated at 2:53 PM.  Appropriate orders placed.  [name] was informed that the remainder of the evaluation will be completed by another provider, this initial triage assessment does not replace that evaluation, and the importance of remaining in the ED until their evaluation is complete.     Eudora Heron, PA-C 01/08/24 1459

## 2024-01-08 NOTE — ED Provider Notes (Addendum)
 [place] EMERGENCY DEPARTMENT AT [facility] Provider Note   CSN: [id number] Arrival date & time: 01/08/24  1410     History  Chief Complaint  Patient presents with   Chest Pain   Shortness of Breath    Brian [name] is a 89 y.o. male.  Patient sent in from doctor's office.  For shortness of breath and some left-sided chest pain.  Patient does not use oxygen at home.  Oxygen levels look very good here he satting 97% on room air.  Apparently when he first came in his heart rate was in the low 100s he is usually in atrial fibs he is on blood thinners.  Is also on Lasix .  Past medical history significant for history of venous thrombosis and embolism history of stroke history of obesity history of atrial flutter.  Past surgical history significant for appendectomy hernia repair total hip arthroplasty mitral valve repair and patient is a former smoker.       Home Medications Prior to Admission medications   Medication Sig Start Date End Date Taking? Authorizing Provider  ALPRAZolam  (XANAX ) 0.25 MG tablet Take 0.25 mg by mouth 2 (two) times daily as needed for anxiety.   Yes [provider]  cetirizine  (ZYRTEC ) 10 MG tablet TAKE 1 TABLET(10 MG) BY MOUTH AT BEDTIME Patient taking differently: Take 10 mg by mouth daily. 12/17/23  Yes Tonna Frederic, MD  clotrimazole  (LOTRIMIN ) 1 % cream Apply 1 Application topically 2 (two) times daily as needed (for rash).   Yes [provider]  ELIQUIS  2.5 MG TABS tablet TAKE 1 TABLET(2.5 MG) BY MOUTH TWICE DAILY 09/13/23  Yes Tonna Frederic, MD  finasteride  (PROSCAR ) 5 MG tablet TAKE 1 TABLET(5 MG) BY MOUTH DAILY 09/10/23  Yes Tonna Frederic, MD  fluticasone [facility]) 50 MCG/ACT nasal spray Place 1 spray into both nostrils daily as needed for allergies. 12/24/20  Yes [provider]  furosemide  (LASIX ) 40 MG tablet Take 1 tablet (40 mg total) by mouth daily. 11/13/23  Yes Tonna Frederic,  MD  gentamicin ointment (GARAMYCIN) 0.1 % Apply topically as needed (pressure sore). 04/18/23  Yes [provider]  hydrocortisone  ointment 0.5 % Apply 1 Application topically daily as needed for itching.   Yes [provider]  melatonin 5 MG TABS Take 5 mg by mouth at bedtime as needed (sleep).   Yes [provider]  metoprolol  succinate (TOPROL -XL) 25 MG 24 hr tablet Take 1 tablet (25 mg total) by mouth daily. 11/13/23  Yes Tonna Frederic, MD  Multiple Vitamin (MULTIVITAMIN) tablet Take 1 tablet by mouth 2 (two) times daily.   Yes [provider]  MUPIROCIN EX Apply 1 Application topically daily as needed (wound care).   Yes [provider]  oxyCODONE -acetaminophen  (PERCOCET) 7.5-325 MG tablet Take 1 tablet by mouth 2 (two) times daily as needed for severe pain (pain score 7-10). Patient taking differently: Take 1 tablet by mouth 2 (two) times daily. 01/02/24 02/01/24 Yes Patel, Seema K, NP  pantoprazole  (PROTONIX ) 40 MG tablet Take 1 tablet (40 mg total) by mouth daily. 11/13/23  Yes Tonna Frederic, MD  polyethylene glycol powder (GLYCOLAX /MIRALAX ) 17 GM/SCOOP powder Mix one scoop with water and take daily until stools loosen. May repeat as needed. Patient taking differently: Take 17 g by mouth daily as needed for mild constipation. 01/20/20  Yes Tonna Frederic, MD  pravastatin  (PRAVACHOL ) 40 MG tablet TAKE 1 TABLET(40 MG) BY MOUTH DAILY 10/26/22  Yes Tonna Frederic, MD  pregabalin  (LYRICA ) 25 MG capsule Take 2 capsules (50 mg total) by mouth at bedtime. 11/27/23 02/25/24 Yes Patel, Seema K, NP  QUEtiapine  (SEROQUEL ) 50 MG tablet TAKE 1 TABLET(50 MG) BY MOUTH AT BEDTIME 11/13/23  Yes Tonna Frederic, MD  sertraline  (ZOLOFT ) 50 MG tablet TAKE 1 TABLET(50 MG) BY MOUTH EVERY MORNING 12/03/23  Yes Tonna Frederic, MD  tamsulosin  (FLOMAX ) 0.4 MG CAPS capsule TAKE 1 CAPSULE(0.4 MG) BY MOUTH DAILY 08/21/23  Yes Tonna Frederic, MD  trimethoprim (TRIMPEX) 100 MG tablet Take 100 mg by mouth at bedtime. 12/12/23  Yes [provider]  oxyCODONE -acetaminophen  (PERCOCET) 7.5-325 MG tablet Take 1 tablet by mouth 2 (two) times daily as needed for severe pain (pain score 7-10). Patient not taking: Reported on 01/08/2024 02/01/24 03/02/24  Patel, Seema K, NP      Allergies    Novocain [procaine hcl], Other, and Penicillins    Review of Systems   Review of Systems  Constitutional:  Negative for chills and fever.  HENT:  Negative for ear pain and sore throat.   Eyes:  Negative for pain and visual disturbance.  Respiratory:  Positive for shortness of breath. Negative for cough.   Cardiovascular:  Positive for chest pain. Negative for palpitations.  Gastrointestinal:  Negative for abdominal pain and vomiting.  Genitourinary:  Negative for dysuria and hematuria.  Musculoskeletal:  Negative for arthralgias and back pain.  Skin:  Negative for color change and rash.  Neurological:  Negative for seizures and syncope.  All other systems reviewed and are negative.   Physical Exam Updated Vital Signs BP 124/84   Pulse 100   Temp 98.8 F (37.1 C)   Resp 19   Wt 87 kg   SpO2 97%   BMI 30.96 kg/m  Physical Exam Vitals and nursing note reviewed.  Constitutional:      General: He is not in acute distress.    Appearance: Normal appearance. He is well-developed. He is not ill-appearing.  HENT:     Head: Normocephalic and atraumatic.  Eyes:     Conjunctiva/sclera: Conjunctivae normal.  Cardiovascular:     Rate and Rhythm: Normal rate. Rhythm irregular.     Heart sounds: No murmur heard. Pulmonary:     Effort: Pulmonary effort is normal. No respiratory distress.     Breath sounds: Normal breath sounds.  Abdominal:     Palpations: Abdomen is soft.     Tenderness: There is no abdominal tenderness.  Musculoskeletal:        General: No swelling.     Cervical back: Neck supple.     Right lower leg: No  edema.     Left lower leg: No edema.  Skin:    General: Skin is warm and dry.     Capillary Refill: Capillary refill takes less than 2 seconds.  Neurological:     Mental Status: He is alert. Mental status is at baseline.  Psychiatric:        Mood and Affect: Mood normal.     ED Results / Procedures / Treatments   Labs (all labs ordered are listed, but only abnormal results are displayed) Labs Reviewed  BASIC METABOLIC PANEL WITH GFR - Abnormal; Notable for the following components:      Result Value   Glucose, Bld 107 (*)    All other components within normal limits  CBC - Abnormal; Notable for the following components:   RBC 4.08 (*)  Hemoglobin 9.7 (*)    HCT 32.9 (*)    MCH 23.8 (*)    MCHC 29.5 (*)    RDW 17.1 (*)    All other components within normal limits  BRAIN NATRIURETIC PEPTIDE - Abnormal; Notable for the following components:   B Natriuretic Peptide 640.6 (*)    All other components within normal limits  URINALYSIS, W/ REFLEX TO CULTURE (INFECTION SUSPECTED) - Abnormal; Notable for the following components:   APPearance HAZY (*)    Hgb urine dipstick MODERATE (*)    Ketones, ur 20 (*)    Protein, ur 30 (*)    Nitrite POSITIVE (*)    Leukocytes,Ua LARGE (*)    Bacteria, UA MANY (*)    All other components within normal limits  TROPONIN I (HIGH SENSITIVITY) - Abnormal; Notable for the following components:   Troponin I (High Sensitivity) 59 (*)    All other components within normal limits  TROPONIN I (HIGH SENSITIVITY) - Abnormal; Notable for the following components:   Troponin I (High Sensitivity) 59 (*)    All other components within normal limits  URINE CULTURE  MAGNESIUM     EKG EKG Interpretation Date/Time:  Tuesday Jan 08 2024 14:50:24 EDT Ventricular Rate:  112 PR Interval:    QRS Duration:  148 QT Interval:  332 QTC Calculation: 453 R Axis:   -63  Text Interpretation: Atrial fibrillation with rapid ventricular response Left axis  deviation Left bundle branch block Confirmed by Iva Mariner ([id number]) on 01/08/2024 3:48:50 PM  Radiology CT Chest W Contrast Result Date: 01/08/2024 CLINICAL DATA:  Chest pain and shortness of breath EXAM: CT CHEST WITH CONTRAST TECHNIQUE: Multidetector CT imaging of the chest was performed during intravenous contrast administration. RADIATION DOSE REDUCTION: This exam was performed according to the departmental dose-optimization program which includes automated exposure control, adjustment of the mA and/or kV according to patient size and/or use of iterative reconstruction technique. CONTRAST:  58mL OMNIPAQUE  IOHEXOL  350 MG/ML SOLN COMPARISON:  Earlier same day chest radiograph, CTA chest dated 04/11/2023 FINDINGS: Cardiovascular: Multichamber cardiomegaly. Mitral annular prosthesis. No significant pericardial fluid/thickening. Great vessels are normal in course and caliber. No central pulmonary emboli. Coronary artery calcifications and aortic atherosclerosis. Mediastinum/Nodes: Imaged thyroid  gland without nodules meeting criteria for imaging follow-up by size. Normal esophagus. Multi station lymphadenopathy, for example: 12 mm right supraclavicular lymph node (3:18), previously 10 mm, 11 mm right axillary (3:31), previously 6 mm, and 10 mm pretracheal (3:52), unchanged. Lungs/Pleura: The central airways are patent. Diffuse interlobular septal thickening with scattered areas of a geometric ground-glass opacities. Thickening along the left major fissure. Bilateral lower lobe relaxation atelectasis. No pneumothorax. Increased moderate bilateral pleural effusions. Upper abdomen: Unchanged multifocal hepatic hypodensities measuring up to 1.8 cm in segment 2 (3: 135), likely cysts. Musculoskeletal: Median sternotomy wires are nondisplaced. 2.5 cm lipoma within the right posterior deltoid (3:17). IMPRESSION: 1. Pulmonary edema with increased moderate bilateral pleural effusions. 2. Multichamber cardiomegaly. 3. Multi  station lymphadenopathy, likely reactive. 4. Aortic Atherosclerosis (ICD10-I70.0). Coronary artery calcifications. Assessment for potential risk factor modification, dietary therapy or pharmacologic therapy may be warranted, if clinically indicated. Electronically Signed   By: Limin  Xu M.D.   On: 01/08/2024 19:35   DG Chest Portable 1 View Result Date: 01/08/2024 CLINICAL DATA:  Chest pain short of breath EXAM: PORTABLE CHEST 1 VIEW COMPARISON:  04/11/2023 FINDINGS: Sternotomy and valve prosthesis. Cardiomegaly with vascular congestion and small right and moderate left pleural effusions. Left greater than right bibasilar airspace disease.  No pneumothorax IMPRESSION: Cardiomegaly with vascular congestion and small right and moderate left pleural effusions. Left greater than right bibasilar airspace disease, atelectasis versus pneumonia. Electronically Signed   By: Esmeralda Hedge M.D.   On: 01/08/2024 16:38    Procedures Procedures    Medications Ordered in ED Medications  metoprolol  tartrate (LOPRESSOR ) injection 5 mg (5 mg Intravenous Given 01/08/24 1612)  cefTRIAXone  (ROCEPHIN ) 1 g in sodium chloride  0.9 % 100 mL IVPB (0 g Intravenous Stopped 01/08/24 1851)  iohexol  (OMNIPAQUE ) 350 MG/ML injection 58 mL (58 mLs Intravenous Contrast Given 01/08/24 1822)    ED Course/ Medical Decision Making/ A&P                                 Medical Decision Making Amount and/or Complexity of Data Reviewed Radiology: ordered.  Risk Prescription drug management.   Patient's oxygen sats here are very reassuring.  Will see what chest x-ray shows what troponin shows.  Basic metabolic panel GFR greater than 60 electrolytes normal.  Magnesium  normal at 1.8 CBC white count 9.2 hemoglobin 9.7 a little down from where he has been.  But not significantly down.  EKG showed atrial fibs patient is on Eliquis .  Was given some beta-blocker and his heart rate now is down below 100.  Apparently was in the low 100s  initially.  Patient's troponins are stable at 59.  Urinalysis was positive nitrite white blood cells greater than 50 bacteria many.  Consistent with UTI.  Urine sent for culture and patient treated with Rocephin .  BMP 640 basic metabolic panel renal function normal electrolytes normal.  Magnesium  1.8. CBC white count 9.2 hemoglobin 9.7 platelets 209.  CT chest was done because chest x-ray raise some concerns about pleural effusion and may be airspace disease.  CT chest pulmonary edema with increased moderate bilateral pleural effusions multichamber cardiomegaly and some coronary artery calcifications.  Based on this and the urinary tract infection and looks like some pulmonary edema does not look like pneumonia.  Will contact hospitalist for admission.  Patient's oxygen saturations have been good here.  That count and goes along with the CT finding but could explain if he gets mobile or gets some exertion where his sats would drop down.  Secondary to some pulmonary edema and the pleural effusions also in the face of the urinary tract infection patient got Rocephin  for that we will feel he does need to be admitted.  Final Clinical Impression(s) / ED Diagnoses Final diagnoses:  Shortness of breath  Atypical chest pain  Chronic pulmonary edema  Acute cystitis without hematuria    Rx / DC Orders ED Discharge Orders     None         Nicklas Barns, MD 01/08/24 1629    Nicklas Barns, MD 01/08/24 2126

## 2024-01-08 NOTE — ED Notes (Signed)
 CCMD called.

## 2024-01-08 NOTE — ED Triage Notes (Signed)
 Patient c/o chest pain and sob that started today.

## 2024-01-08 NOTE — Progress Notes (Signed)
 [name] ED 011 - [facility] Liaison Note                  This patient is enrolled in the Home Based Primary Care program of [facility].   Hospital Liaison Team will follow for disposition.   Please reach out if there are questions or concerns.   Madelene Schanz, BSN, RN, [facility]  [phone]

## 2024-01-08 NOTE — Telephone Encounter (Signed)
  Chief Complaint: difficulty breathing Symptoms: difficulty breathing, wheezing,  Frequency: yesterday started Pertinent Negatives: Patient denies fever,  Disposition: [] ED /[] Urgent Care (no appt availability in office) / [x] Appointment(In office/virtual)/ []  [place] [facility]/ [] Home Care/ [] Refused Recommended Disposition /[] [place] [facility]/ []  Follow-up with PCP Additional Notes: HH calling to say that he is coming to the physician today. Pt states that he has been having difficulty breathing in the last 24 hours. + wheezing. SPO2 93%, 97 HR. Appetite diminished. HH states that he has a spot on his neck that that is red. Pt c/o burning with urination as well. Per RN onsite pt has L lung diminished breath sounds. Pt has appt today at 1320. Copied from CRM [id number]. Topic: Clinical - Red Word Triage >> Jan 08, 2024 11:27 AM Freya Jesus wrote: Red Word that prompted transfer to Nurse Triage: Sherleen Dirks a registered nurse from [facility] health - She said she is here for patient appointment he said he is having trouble breathing, wheezing, Oxygen is 93 % - He is able to talk and he is going to see the Physician right now. Reason for Disposition  [1] MILD difficulty breathing (e.g., minimal/no SOB at rest, SOB with walking, pulse <100) AND [2] NEW-onset or WORSE than normal  Protocols used: Breathing Difficulty-A-AH

## 2024-01-09 ENCOUNTER — Inpatient Hospital Stay (HOSPITAL_COMMUNITY)

## 2024-01-09 DIAGNOSIS — R7989 Other specified abnormal findings of blood chemistry: Secondary | ICD-10-CM | POA: Insufficient documentation

## 2024-01-09 DIAGNOSIS — I509 Heart failure, unspecified: Secondary | ICD-10-CM

## 2024-01-09 DIAGNOSIS — I5033 Acute on chronic diastolic (congestive) heart failure: Secondary | ICD-10-CM | POA: Diagnosis not present

## 2024-01-09 DIAGNOSIS — J9601 Acute respiratory failure with hypoxia: Secondary | ICD-10-CM

## 2024-01-09 DIAGNOSIS — I483 Typical atrial flutter: Secondary | ICD-10-CM

## 2024-01-09 LAB — CBC
HCT: 37.2 % — ABNORMAL LOW (ref 39.0–52.0)
Hemoglobin: 11 g/dL — ABNORMAL LOW (ref 13.0–17.0)
MCH: 23.6 pg — ABNORMAL LOW (ref 26.0–34.0)
MCHC: 29.6 g/dL — ABNORMAL LOW (ref 30.0–36.0)
MCV: 79.7 fL — ABNORMAL LOW (ref 80.0–100.0)
Platelets: 250 10*3/uL (ref 150–400)
RBC: 4.67 MIL/uL (ref 4.22–5.81)
RDW: 17.4 % — ABNORMAL HIGH (ref 11.5–15.5)
WBC: 11.2 10*3/uL — ABNORMAL HIGH (ref 4.0–10.5)
nRBC: 0 % (ref 0.0–0.2)

## 2024-01-09 LAB — ECHOCARDIOGRAM COMPLETE
AR max vel: 3.16 cm2
AV Area VTI: 2.99 cm2
AV Area mean vel: 3.05 cm2
AV Mean grad: 4 mmHg
AV Peak grad: 7.6 mmHg
Ao pk vel: 1.38 m/s
S' Lateral: 5.3 cm
Weight: 3068.8 [oz_av]

## 2024-01-09 LAB — BASIC METABOLIC PANEL WITH GFR
Anion gap: 15 (ref 5–15)
BUN: 15 mg/dL (ref 8–23)
CO2: 20 mmol/L — ABNORMAL LOW (ref 22–32)
Calcium: 9.2 mg/dL (ref 8.9–10.3)
Chloride: 103 mmol/L (ref 98–111)
Creatinine, Ser: 0.87 mg/dL (ref 0.61–1.24)
GFR, Estimated: >60 mL/min (ref >60)
Glucose, Bld: 127 mg/dL — ABNORMAL HIGH (ref 70–99)
Potassium: 4.2 mmol/L (ref 3.5–5.1)
Sodium: 138 mmol/L (ref 135–145)

## 2024-01-09 LAB — PHOSPHORUS: Phosphorus: 3.5 mg/dL (ref 2.5–4.6)

## 2024-01-09 LAB — MAGNESIUM: Magnesium: 1.8 mg/dL (ref 1.7–2.4)

## 2024-01-09 MED ORDER — ALPRAZOLAM 0.25 MG PO TABS: 0.2500 mg | ORAL_TABLET | Freq: Two times a day (BID) | ORAL | Status: DC | PRN

## 2024-01-09 MED ORDER — ACETAMINOPHEN 325 MG PO TABS
650.0000 mg | ORAL_TABLET | Freq: Four times a day (QID) | ORAL | Status: DC | PRN
  Administered 2024-01-09 – 2024-01-11 (×2): 650 mg via ORAL
  Filled 2024-01-09 (×2): qty 2

## 2024-01-09 MED ORDER — FUROSEMIDE 10 MG/ML IJ SOLN
20.0000 mg | Freq: Every day | INTRAMUSCULAR | Status: AC
  Administered 2024-01-09 – 2024-01-10 (×2): 20 mg via INTRAVENOUS
  Filled 2024-01-09 (×2): qty 2

## 2024-01-09 MED ORDER — ENOXAPARIN SODIUM 40 MG/0.4ML IJ SOSY: 40.0000 mg | PREFILLED_SYRINGE | INTRAMUSCULAR | Status: DC

## 2024-01-09 MED ORDER — MELATONIN 5 MG PO TABS
5.0000 mg | ORAL_TABLET | Freq: Every evening | ORAL | Status: DC | PRN
  Administered 2024-01-10: 5 mg via ORAL
  Filled 2024-01-09: qty 1

## 2024-01-09 MED ORDER — METOPROLOL SUCCINATE ER 25 MG PO TB24
25.0000 mg | ORAL_TABLET | Freq: Every day | ORAL | Status: DC
  Administered 2024-01-09: 25 mg via ORAL
  Filled 2024-01-09 (×2): qty 1

## 2024-01-09 MED ORDER — APIXABAN 2.5 MG PO TABS
2.5000 mg | ORAL_TABLET | Freq: Two times a day (BID) | ORAL | Status: DC
  Administered 2024-01-09 – 2024-01-11 (×6): 2.5 mg via ORAL
  Filled 2024-01-09 (×6): qty 1

## 2024-01-09 MED ORDER — SODIUM CHLORIDE 0.9 % IV SOLN
1.0000 g | INTRAVENOUS | Status: DC
  Administered 2024-01-09 – 2024-01-11 (×3): 1 g via INTRAVENOUS
  Filled 2024-01-09 (×3): qty 10

## 2024-01-09 MED ORDER — POLYETHYLENE GLYCOL 3350 17 G PO PACK
17.0000 g | PACK | Freq: Every day | ORAL | Status: DC | PRN
  Administered 2024-01-10: 17 g via ORAL
  Filled 2024-01-09: qty 1

## 2024-01-09 MED ORDER — PROCHLORPERAZINE EDISYLATE 10 MG/2ML IJ SOLN: 5.0000 mg | Freq: Four times a day (QID) | INTRAMUSCULAR | Status: DC | PRN

## 2024-01-09 MED ORDER — PANTOPRAZOLE SODIUM 40 MG PO TBEC
40.0000 mg | DELAYED_RELEASE_TABLET | Freq: Every day | ORAL | Status: DC
  Administered 2024-01-09 – 2024-01-11 (×3): 40 mg via ORAL
  Filled 2024-01-09 (×3): qty 1

## 2024-01-09 MED ORDER — SERTRALINE HCL 50 MG PO TABS
50.0000 mg | ORAL_TABLET | Freq: Every day | ORAL | Status: DC
  Administered 2024-01-09 – 2024-01-11 (×3): 50 mg via ORAL
  Filled 2024-01-09 (×3): qty 1

## 2024-01-09 MED ORDER — FINASTERIDE 5 MG PO TABS
5.0000 mg | ORAL_TABLET | Freq: Every day | ORAL | Status: DC
  Administered 2024-01-09 – 2024-01-11 (×3): 5 mg via ORAL
  Filled 2024-01-09 (×3): qty 1

## 2024-01-09 MED ORDER — PREGABALIN 25 MG PO CAPS
50.0000 mg | ORAL_CAPSULE | Freq: Every day | ORAL | Status: DC
  Administered 2024-01-09 – 2024-01-10 (×2): 50 mg via ORAL
  Filled 2024-01-09 (×2): qty 2

## 2024-01-09 MED ORDER — PERFLUTREN LIPID MICROSPHERE
1.0000 mL | INTRAVENOUS | Status: AC | PRN
  Administered 2024-01-09: 3 mL via INTRAVENOUS

## 2024-01-09 MED ORDER — QUETIAPINE FUMARATE 25 MG PO TABS
25.0000 mg | ORAL_TABLET | Freq: Every day | ORAL | Status: DC
  Administered 2024-01-09 – 2024-01-10 (×3): 25 mg via ORAL
  Filled 2024-01-09 (×3): qty 1

## 2024-01-09 MED ORDER — PRAVASTATIN SODIUM 40 MG PO TABS
40.0000 mg | ORAL_TABLET | Freq: Every day | ORAL | Status: DC
  Administered 2024-01-09 – 2024-01-10 (×2): 40 mg via ORAL
  Filled 2024-01-09 (×2): qty 1

## 2024-01-09 MED ORDER — TAMSULOSIN HCL 0.4 MG PO CAPS
0.4000 mg | ORAL_CAPSULE | Freq: Every day | ORAL | Status: DC
  Administered 2024-01-09 – 2024-01-10 (×3): 0.4 mg via ORAL
  Filled 2024-01-09 (×3): qty 1

## 2024-01-09 NOTE — ED Notes (Signed)
 Transport called for PT

## 2024-01-09 NOTE — Evaluation (Addendum)
 Physical Therapy Evaluation Patient Details Name: Brian [name] MRN: [id number] DOB: 07-29-1933 Today's Date: 01/09/2024  History of Present Illness  89 y.o. male presents to [facility] from PCP on 01/08/24 due to worsening SOB, L sided chest pain, generalized weakness, and mild confusion. Chest x-ray showed pulmonary edema with L>R pleural effusions. Admitted with acute on chronic HFpEF. PMHx: chronic pain syndrome, persistent atrial fibrillation on Eliquis , chronic HFpEF, prior CVA, TIA, obesity, hypertension, prior mitral valve repair in 2002   Clinical Impression  Pt supine in stretcher upon arrival and agreeable to PT eval. PTA, pt required assist for all mobility and ADLs. Pt reported performing lateral scoots to WC with TotalA with staff pushing WC for mobility. In today's session, pt required MaxA for bed mobility and MaxA to support heavy posterior lean once in seated position. Further transfer deferred 2/2 safety. Recommend staff use hoyer lift for OOB mobility while in the hospital. Pt is likely at functional mobility baseline with no acute PT needs. Pt had a positive experience with HHPT and was interested in having HHPT upon d/c from the hospital. Acute PT signing off. Please re-consult if new needs arise.   BP 129/74, 106 BPM        If plan is discharge home, recommend the following: A lot of help with walking and/or transfers;A lot of help with bathing/dressing/bathroom;Assistance with cooking/housework;Direct supervision/assist for medications management;Direct supervision/assist for financial management;Assist for transportation;Help with stairs or ramp for entrance   Can travel by private vehicle    No    Equipment Recommendations None recommended by PT     Functional Status Assessment Patient has had a recent decline in their functional status and demonstrates the ability to make significant improvements in function in a reasonable and predictable amount of time.     Precautions  / Restrictions Precautions Precautions: Fall Restrictions Weight Bearing Restrictions Per Provider Order: No      Mobility  Bed Mobility Overal bed mobility: Needs Assistance Bed Mobility: Supine to Sit, Sit to Supine    Supine to sit: Max assist, HOB elevated Sit to supine: Max assist, HOB elevated   General bed mobility comments: slightly able to assist with sliding LE's towards EOB, however, would bring LE's back to supine. MaxA to complete moving LE's off EOB and to raise trunk. Unable to maintain seated balance without MaxA    Transfers    General transfer comment: Deferred for pt/staff safety      Balance Overall balance assessment: Needs assistance, Mild deficits observed, not formally tested Sitting-balance support: Bilateral upper extremity supported, Feet unsupported Sitting balance-Leahy Scale: Zero Sitting balance - Comments: unable to maintain balance without MaxA Postural control: Posterior lean        Pertinent Vitals/Pain Pain Assessment Pain Assessment: No/denies pain    Home Living Family/patient expects to be discharged to:: Assisted living    Home Equipment: Wheelchair - manual Additional Comments: Unable to state other owned DME    Prior Function Prior Level of Function : Needs assist       Physical Assist : Mobility (physical);ADLs (physical) Mobility (physical): Bed mobility;Transfers ADLs (physical): Grooming;Bathing;Dressing;Toileting;IADLs Mobility Comments: assist for all mobility, performs lateral scoots to [facility] with total assist. Has staff push [facility]       Extremity/Trunk Assessment   Upper Extremity Assessment Upper Extremity Assessment: Defer to OT evaluation    Lower Extremity Assessment Lower Extremity Assessment: Generalized weakness    Cervical / Trunk Assessment Cervical / Trunk Assessment: Other exceptions Cervical /  Trunk Exceptions: increased body habitus  Communication   Communication Communication:  Impaired Factors Affecting Communication: Hearing impaired    Cognition Arousal: Alert Behavior During Therapy: WFL for tasks assessed/performed   PT - Cognitive impairments: No family/caregiver present to determine baseline, Orientation, Sequencing, Problem solving, Safety/Judgement, Memory   Orientation impairments: Place, Time, Situation    PT - Cognition Comments: A&Ox1, frequent cues for re-direction and adherance to task at hand Following commands: Impaired Following commands impaired: Follows one step commands inconsistently, Follows one step commands with increased time     Cueing Cueing Techniques: Verbal cues, Tactile cues      PT Assessment All further PT needs can be met in the next venue of care  PT Problem List Decreased strength;Decreased balance;Decreased activity tolerance;Decreased mobility;Decreased cognition        AM-PAC PT "6 Clicks" Mobility  Outcome Measure Help needed turning from your back to your side while in a flat bed without using bedrails?: A Lot Help needed moving from lying on your back to sitting on the side of a flat bed without using bedrails?: A Lot Help needed moving to and from a bed to a chair (including a wheelchair)?: Total Help needed standing up from a chair using your arms (e.g., wheelchair or bedside chair)?: Total Help needed to walk in hospital room?: Total Help needed climbing 3-5 steps with a railing? : Total 6 Click Score: 8    End of Session   Activity Tolerance: Patient tolerated treatment well Patient left: in bed;with call bell/phone within reach Nurse Communication: Mobility status PT Visit Diagnosis: Other abnormalities of gait and mobility (R26.89);Muscle weakness (generalized) (M62.81);Difficulty in walking, not elsewhere classified (R26.2)    Time: 1610-9604 PT Time Calculation (min) (ACUTE ONLY): 13 min   Charges:   PT Evaluation $PT Eval Low Complexity: 1 Low   PT General Charges $$ ACUTE PT VISIT: 1  Visit        Orysia Blas, PT, DPT Secure Chat Preferred  Rehab Office [phone]  Alissa April Adela Ades 01/09/2024, 12:13 PM

## 2024-01-09 NOTE — Progress Notes (Signed)
 Report received from Fabio Holts, RN at [facility]

## 2024-01-09 NOTE — Progress Notes (Signed)
 Progress Note    Brian [name]   GNF:[id number]  DOB: March 14, 1933  DOA: 01/08/2024     1 PCP: Tonna Frederic, MD  Initial CC: [facility] Course: Mr. Beevers is a 89 year old male with PMH PAF, chronic diastolic CHF, CVA, HTN, mitral valve repair, chronic pain, BPH, HTN, obesity who presented with worsening shortness of breath.  He is not on oxygen at home and was found to also be hypoxic on workup in the ER. CXR and CT chest showed pulmonary edema with moderate bilateral pleural effusions.  BNP elevated, 640. He denied any chest pain.  Troponin trend flat. He was admitted for CHF exacerbation and started on IV diuresis.  Interval History:  Resting in bed in the ER this morning.  Appears comfortable but still states that he does feel short of breath.  Remains on oxygen, 2 L as well.  Assessment and Plan: * Acute on chronic diastolic CHF (congestive heart failure) (HCC) - Presented with worsening shortness of breath/dyspnea -Pleural effusion and pulmonary edema noted on CT chest - BNP elevated 640 - Continue IV Lasix   Acute respiratory failure with hypoxia (HCC) - From presumed CHF exacerbation -Not on oxygen at home - Continue weaning as able  Elevated troponin - Flat trend with no reports of chest pain on admission - Troponin leak suspected in setting of RVR  Mixed hyperlipidemia - Continue pravastatin   ATRIAL FLUTTER - Continue Eliquis  - Resume Toprol    Old records reviewed in assessment of this patient  Antimicrobials: Rocephin  01/08/2024 >> current  DVT prophylaxis:  apixaban  (ELIQUIS ) tablet 2.5 mg Start: 01/09/24 0245 apixaban  (ELIQUIS ) tablet 2.5 mg   Code Status:   Code Status: Full Code  Mobility Assessment (Last 72 Hours)     Mobility Assessment     Row Name 01/09/24 1154           What is the highest level of mobility based on the progressive mobility assessment? Level 1 (Bedfast) - Unable to balance while sitting on edge of bed                 Barriers to discharge: None Disposition Plan: Home HH orders placed: N/A Status is: Inpatient  Objective: Blood pressure 123/73, pulse (!) 113, temperature 97.9 F (36.6 C), temperature source Oral, resp. rate (!) 23, height 5\' 6"  (1.676 m), weight 87 kg, SpO2 93%.  Examination:  Physical Exam Constitutional:      General: He is not in acute distress.    Appearance: Normal appearance.  HENT:     Head: Normocephalic and atraumatic.     Mouth/Throat:     Mouth: Mucous membranes are moist.  Eyes:     Extraocular Movements: Extraocular movements intact.  Cardiovascular:     Rate and Rhythm: Normal rate and regular rhythm.  Pulmonary:     Effort: Pulmonary effort is normal. No respiratory distress.     Breath sounds: Rales present. No wheezing.  Abdominal:     General: Bowel sounds are normal. There is no distension.     Palpations: Abdomen is soft.     Tenderness: There is no abdominal tenderness.  Musculoskeletal:        General: Normal range of motion.     Cervical back: Normal range of motion and neck supple.  Skin:    General: Skin is warm and dry.  Neurological:     General: No focal deficit present.     Mental Status: He is alert.  Psychiatric:  Mood and Affect: Mood normal.        Behavior: Behavior normal.      Consultants:    Procedures:    Data Reviewed: Results for orders placed or performed during the hospital encounter of 01/08/24 (from the past 24 hours)  Basic metabolic panel     Status: Abnormal   Collection Time: 01/08/24  2:52 PM  Result Value Ref Range   Sodium 138 135 - 145 mmol/L   Potassium 4.3 3.5 - 5.1 mmol/L   Chloride 103 98 - 111 mmol/L   CO2 25 22 - 32 mmol/L   Glucose, Bld 107 (H) 70 - 99 mg/dL   BUN 13 8 - 23 mg/dL   Creatinine, Ser 1.61 0.61 - 1.24 mg/dL   Calcium  8.9 8.9 - 10.3 mg/dL   GFR, Estimated >09 >60 mL/min   Anion gap 10 5 - 15  Magnesium      Status: None   Collection Time: 01/08/24  2:52 PM   Result Value Ref Range   Magnesium  1.8 1.7 - 2.4 mg/dL  CBC     Status: Abnormal   Collection Time: 01/08/24  2:52 PM  Result Value Ref Range   WBC 9.2 4.0 - 10.5 K/uL   RBC 4.08 (L) 4.22 - 5.81 MIL/uL   Hemoglobin 9.7 (L) 13.0 - 17.0 g/dL   HCT 45.4 (L) 09.8 - 11.9 %   MCV 80.6 80.0 - 100.0 fL   MCH 23.8 (L) 26.0 - 34.0 pg   MCHC 29.5 (L) 30.0 - 36.0 g/dL   RDW 14.7 (H) 82.9 - 56.2 %   Platelets 209 150 - 400 K/uL   nRBC 0.0 0.0 - 0.2 %  Troponin I (High Sensitivity)     Status: Abnormal   Collection Time: 01/08/24  2:52 PM  Result Value Ref Range   Troponin I (High Sensitivity) 59 (H) <18 ng/L  Brain natriuretic peptide     Status: Abnormal   Collection Time: 01/08/24  3:36 PM  Result Value Ref Range   B Natriuretic Peptide 640.6 (H) 0.0 - 100.0 pg/mL  Urinalysis, w/ Reflex to Culture (Infection Suspected) -Urine, Clean Catch     Status: Abnormal   Collection Time: 01/08/24  3:50 PM  Result Value Ref Range   Specimen Source URINE, CLEAN CATCH    Color, Urine YELLOW YELLOW   APPearance HAZY (A) CLEAR   Specific Gravity, Urine 1.016 1.005 - 1.030   pH 5.0 5.0 - 8.0   Glucose, UA NEGATIVE NEGATIVE mg/dL   Hgb urine dipstick MODERATE (A) NEGATIVE   Bilirubin Urine NEGATIVE NEGATIVE   Ketones, ur 20 (A) NEGATIVE mg/dL   Protein, ur 30 (A) NEGATIVE mg/dL   Nitrite POSITIVE (A) NEGATIVE   Leukocytes,Ua LARGE (A) NEGATIVE   RBC / HPF 0-5 0 - 5 RBC/hpf   WBC, UA >50 0 - 5 WBC/hpf   Bacteria, UA MANY (A) NONE SEEN   Squamous Epithelial / HPF 0-5 0 - 5 /HPF   Mucus PRESENT   Troponin I (High Sensitivity)     Status: Abnormal   Collection Time: 01/08/24  5:20 PM  Result Value Ref Range   Troponin I (High Sensitivity) 59 (H) <18 ng/L  CBC     Status: Abnormal   Collection Time: 01/09/24  4:43 AM  Result Value Ref Range   WBC 11.2 (H) 4.0 - 10.5 K/uL   RBC 4.67 4.22 - 5.81 MIL/uL   Hemoglobin 11.0 (L) 13.0 - 17.0 g/dL   HCT 13.0 (  L) 39.0 - 52.0 %   MCV 79.7 (L) 80.0 -  100.0 fL   MCH 23.6 (L) 26.0 - 34.0 pg   MCHC 29.6 (L) 30.0 - 36.0 g/dL   RDW 13.0 (H) 86.5 - 78.4 %   Platelets 250 150 - 400 K/uL   nRBC 0.0 0.0 - 0.2 %  Basic metabolic panel     Status: Abnormal   Collection Time: 01/09/24  4:43 AM  Result Value Ref Range   Sodium 138 135 - 145 mmol/L   Potassium 4.2 3.5 - 5.1 mmol/L   Chloride 103 98 - 111 mmol/L   CO2 20 (L) 22 - 32 mmol/L   Glucose, Bld 127 (H) 70 - 99 mg/dL   BUN 15 8 - 23 mg/dL   Creatinine, Ser 6.96 0.61 - 1.24 mg/dL   Calcium  9.2 8.9 - 10.3 mg/dL   GFR, Estimated >29 >52 mL/min   Anion gap 15 5 - 15  Magnesium      Status: None   Collection Time: 01/09/24  4:43 AM  Result Value Ref Range   Magnesium  1.8 1.7 - 2.4 mg/dL  Phosphorus     Status: None   Collection Time: 01/09/24  4:43 AM  Result Value Ref Range   Phosphorus 3.5 2.5 - 4.6 mg/dL   *Note: Due to a large number of results and/or encounters for the requested time period, some results have not been displayed. A complete set of results can be found in Results Review.    I have reviewed pertinent nursing notes, vitals, labs, and images as necessary. I have ordered labwork to follow up on as indicated.  I have reviewed the last notes from staff over past 24 hours. I have discussed patient's care plan and test results with nursing staff, CM/SW, and other staff as appropriate.  Time spent: Greater than 50% of the 55 minute visit was spent in counseling/coordination of care for the patient as laid out in the A&P.   LOS: 1 day   Faith Homes, MD [facility] 01/09/2024, 1:17 PM

## 2024-01-09 NOTE — Assessment & Plan Note (Addendum)
-   Presented with worsening shortness of breath/dyspnea -Pleural effusion and pulmonary edema noted on CT chest - BNP elevated 640 - Echo shows significant worsening from 2023.  EF now 20 to 25% (previously 55 to 60%); indeterminate diastolic parameters.  Global hypokinesis -Discussed findings with daughter, they do wish to pursue some treatment and workup with cardiology while hospitalized -He seems to decline ICD workup; but will start some GDMT and monitor for tolerance -Cardiology consulted, appreciate assistance - continue lasix  and toprol  (dose decreased to allow blood pressure room for GDMT) - Started on losartan  and spironolactone during hospitalization - Clinically stable and improved with plans for outpatient follow-up

## 2024-01-09 NOTE — Hospital Course (Signed)
 Brian [name] is a 89 year old male with PMH PAF, chronic diastolic CHF, CVA, HTN, mitral valve repair, chronic pain, BPH, HTN, obesity who presented with worsening shortness of breath.  He is not on oxygen at home and was found to also be hypoxic on workup in the ER. CXR and CT chest showed pulmonary edema with moderate bilateral pleural effusions.  BNP elevated, 640. He denied any chest pain.  Troponin trend flat. He was admitted for CHF exacerbation and started on IV diuresis.

## 2024-01-09 NOTE — Assessment & Plan Note (Signed)
 Continue pravastatin

## 2024-01-09 NOTE — Plan of Care (Signed)
  Problem: Health Behavior/Discharge Planning: Goal: Ability to manage health-related needs will improve Outcome: Progressing   Problem: Education: Goal: Knowledge of General Education information will improve Description: Including pain rating scale, medication(s)/side effects and non-pharmacologic comfort measures Outcome: Progressing   Problem: Clinical Measurements: Goal: Ability to maintain clinical measurements within normal limits will improve Outcome: Progressing   

## 2024-01-09 NOTE — Assessment & Plan Note (Addendum)
-   From presumed CHF exacerbation - Not on oxygen at home - Bedbound chronically but having some desaturations still; will arrange for home oxygen at discharge

## 2024-01-09 NOTE — Progress Notes (Signed)
 [name] [phone] [facility] liaison note:   This is a current Midwife patient with [facility], followed for home based primary care and palliative care.   [facility] will continue to follow for discharge disposition.   Please call with any [facility] related questions or concerns.   Thank you, Brian Beckwith, LPN [phone]

## 2024-01-09 NOTE — Assessment & Plan Note (Deleted)
-   Continue Eliquis .  Has been on reduced dose due to history of GIB? - will defer to cardiology if dose to be increased given last note mentioning changing to full dose (04/13/23) - Resumed Toprol ; decrease dose

## 2024-01-09 NOTE — H&P (Signed)
 History and Physical  Brian [name] AVW:[id number] DOB: [birth year] DOA: 01/08/2024  Referring physician: Dr. Zackowski PCP: Tonna Frederic, MD  Outpatient Specialists: None. Patient coming from: Assisted living facility.  Chief Complaint: Shortness of breath.  HPI: Brian [name] is a 89 y.o. male with medical history significant for chronic pain syndrome, persistent atrial fibrillation on Eliquis , chronic HFpEF, prior CVA, hypertension, prior mitral valve repair in 2002, presents to the ER sent from his PCP's office due to shortness of breath, worsening for the past few days.  Not hypoxic.  Associated with generalized weakness and mild confusion.  No reported subjective fevers or chills.  In the ER chest x-ray revealed pulmonary edema with bilateral pleural effusions left greater than right.  BNP elevated greater than 600.  Troponin was flat at 59, 59.  Right JVD on exam.  EDP requested admission for acute on chronic HFpEF.  ED Course: Temperature 97.9.  BP 117/86, pulse 109, respiratory rate 21, O2 saturation 98% on room air.  Review of Systems: Review of systems as noted in the HPI. All other systems reviewed and are negative.   Past Medical History:  Diagnosis Date   Anemia    Atrial flutter (HCC)    BPH (benign prostatic hypertrophy)    Epistaxis    Hemorrhage of gastrointestinal tract, unspecified    History of open heart surgery    Hypertension    Obesity (BMI 30.0-34.9) 06/29/2020   Osteoarthrosis, unspecified whether generalized or localized, unspecified site    Personal history of venous thrombosis and embolism    Rosacea    Stroke (HCC)    TIA (transient ischemic attack)    Past Surgical History:  Procedure Laterality Date   APPENDECTOMY     HERNIA REPAIR     IR GENERIC HISTORICAL  05/04/2016   IR PERCUTANEOUS ART THROMBECTOMY/INFUSION INTRACRANIAL INC DIAG ANGIO 05/04/2016 Luellen Sages, MD [facility]   IR GENERIC HISTORICAL  05/04/2016   IR ANGIO  VERTEBRAL SEL SUBCLAVIAN INNOMINATE UNI R MOD SED 05/04/2016 Luellen Sages, MD [facility]   IR GENERIC HISTORICAL  06/07/2016   IR RADIOLOGIST EVAL & MGMT 06/07/2016 [facility]   JOINT REPLACEMENT     MITRAL VALVE REPAIR     mvp repair     RADIOLOGY WITH ANESTHESIA N/A 05/04/2016   Procedure: RADIOLOGY WITH ANESTHESIA;  Surgeon: Luellen Sages, MD;  Location: [facility] OR;  Service: Radiology;  Laterality: N/A;   TOTAL HIP ARTHROPLASTY      Social History:  reports that he has quit smoking. He has never used smokeless tobacco. He reports that he does not drink alcohol and does not use drugs.   Allergies  Allergen Reactions   Novocain [Procaine Hcl]     Irregular heart beat      Other     Patient had problem with epidural with his right hip surgery. Went to up extremity instead of lower extremity    Penicillins Itching    Has patient had a PCN reaction causing immediate rash, facial/tongue/throat swelling, SOB or lightheadedness with hypotension: Unk Has patient had a PCN reaction causing severe rash involving mucus membranes or skin necrosis: Unk Has patient had a PCN reaction that required hospitalization: Unk Has patient had a PCN reaction occurring within the last 10 years: No If all of the above answers are "NO", then may proceed with Cephalosporin use.  No reaction noted    Family History  Problem Relation Age of Onset   Rheumatic fever Mother  Coronary artery disease Father       Prior to Admission medications   Medication Sig Start Date End Date Taking? Authorizing Provider  ALPRAZolam  (XANAX ) 0.25 MG tablet Take 0.25 mg by mouth 2 (two) times daily as needed for anxiety.   Yes [provider]  cetirizine  (ZYRTEC ) 10 MG tablet TAKE 1 TABLET(10 MG) BY MOUTH AT BEDTIME Patient taking differently: Take 10 mg by mouth daily. 12/17/23  Yes Tonna Frederic, MD  clotrimazole  (LOTRIMIN ) 1 % cream Apply 1 Application topically 2 (two) times daily as  needed (for rash).   Yes [provider]  ELIQUIS  2.5 MG TABS tablet TAKE 1 TABLET(2.5 MG) BY MOUTH TWICE DAILY 09/13/23  Yes Tonna Frederic, MD  finasteride  (PROSCAR ) 5 MG tablet TAKE 1 TABLET(5 MG) BY MOUTH DAILY 09/10/23  Yes Tonna Frederic, MD  fluticasone [facility]) 50 MCG/ACT nasal spray Place 1 spray into both nostrils daily as needed for allergies. 12/24/20  Yes [provider]  furosemide  (LASIX ) 40 MG tablet Take 1 tablet (40 mg total) by mouth daily. 11/13/23  Yes Tonna Frederic, MD  gentamicin ointment (GARAMYCIN) 0.1 % Apply topically as needed (pressure sore). 04/18/23  Yes [provider]  hydrocortisone  ointment 0.5 % Apply 1 Application topically daily as needed for itching.   Yes [provider]  melatonin 5 MG TABS Take 5 mg by mouth at bedtime as needed (sleep).   Yes [provider]  metoprolol  succinate (TOPROL -XL) 25 MG 24 hr tablet Take 1 tablet (25 mg total) by mouth daily. 11/13/23  Yes Tonna Frederic, MD  Multiple Vitamin (MULTIVITAMIN) tablet Take 1 tablet by mouth 2 (two) times daily.   Yes [provider]  MUPIROCIN EX Apply 1 Application topically daily as needed (wound care).   Yes [provider]  oxyCODONE -acetaminophen  (PERCOCET) 7.5-325 MG tablet Take 1 tablet by mouth 2 (two) times daily as needed for severe pain (pain score 7-10). Patient taking differently: Take 1 tablet by mouth 2 (two) times daily. 01/02/24 02/01/24 Yes Patel, Seema K, NP  pantoprazole  (PROTONIX ) 40 MG tablet Take 1 tablet (40 mg total) by mouth daily. 11/13/23  Yes Tonna Frederic, MD  polyethylene glycol powder (GLYCOLAX /MIRALAX ) 17 GM/SCOOP powder Mix one scoop with water and take daily until stools loosen. May repeat as needed. Patient taking differently: Take 17 g by mouth daily as needed for mild constipation. 01/20/20  Yes Tonna Frederic, MD  pravastatin  (PRAVACHOL ) 40 MG tablet TAKE 1  TABLET(40 MG) BY MOUTH DAILY 10/26/22  Yes Tonna Frederic, MD  pregabalin  (LYRICA ) 25 MG capsule Take 2 capsules (50 mg total) by mouth at bedtime. 11/27/23 02/25/24 Yes Patel, Seema K, NP  QUEtiapine  (SEROQUEL ) 50 MG tablet TAKE 1 TABLET(50 MG) BY MOUTH AT BEDTIME 11/13/23  Yes Tonna Frederic, MD  sertraline  (ZOLOFT ) 50 MG tablet TAKE 1 TABLET(50 MG) BY MOUTH EVERY MORNING 12/03/23  Yes Tonna Frederic, MD  tamsulosin  (FLOMAX ) 0.4 MG CAPS capsule TAKE 1 CAPSULE(0.4 MG) BY MOUTH DAILY 08/21/23  Yes Tonna Frederic, MD  trimethoprim (TRIMPEX) 100 MG tablet Take 100 mg by mouth at bedtime. 12/12/23  Yes [provider]  oxyCODONE -acetaminophen  (PERCOCET) 7.5-325 MG tablet Take 1 tablet by mouth 2 (two) times daily as needed for severe pain (pain score 7-10). Patient not taking: Reported on 01/08/2024 02/01/24 03/02/24  Cherylin Corrigan, NP    Physical Exam: BP 134/89   Pulse 99   Temp 98.1 F (  36.7 C) (Oral)   Resp 19   Wt 87 kg   SpO2 97%   BMI 30.96 kg/m   General: 89 y.o. year-old male well developed well nourished in no acute distress.  Alert and minimally verbal. Cardiovascular: Regular rate and rhythm with no rubs or gallops.  No thyromegaly.  Right JVD noted.  Chest or extremity edema bilaterally. Respiratory: Clear to auscultation with no wheezes or rales. Good inspiratory effort. Abdomen: Soft nontender nondistended with normal bowel sounds x4 quadrants. Muskuloskeletal: No cyanosis, clubbing or edema noted bilaterally Neuro: CN II-XII intact, strength, sensation, reflexes Skin: No ulcerative lesions noted or rashes Psychiatry: Judgement and insight appear altered. Mood is appropriate for condition and setting          Labs on Admission:  Basic Metabolic Panel: Recent Labs  Lab 01/08/24 1452 01/09/24 0443  NA 138 138  K 4.3 4.2  CL 103 103  CO2 25 20*  GLUCOSE 107* 127*  BUN 13 15  CREATININE 0.94 0.87  CALCIUM  8.9 9.2  MG 1.8 1.8  PHOS   --  3.5   Liver Function Tests: No results for input(s): "AST", "ALT", "ALKPHOS", "BILITOT", "PROT", "ALBUMIN" in the last 168 hours. No results for input(s): "LIPASE", "AMYLASE" in the last 168 hours. No results for input(s): "AMMONIA" in the last 168 hours. CBC: Recent Labs  Lab 01/08/24 1452 01/09/24 0443  WBC 9.2 11.2*  HGB 9.7* 11.0*  HCT 32.9* 37.2*  MCV 80.6 79.7*  PLT 209 250   Cardiac Enzymes: No results for input(s): "CKTOTAL", "CKMB", "CKMBINDEX", "TROPONINI" in the last 168 hours.  BNP (last 3 results) Recent Labs    04/11/23 1220 04/24/23 1446 01/08/24 1536  BNP 653.4* 404.0* 640.6*    ProBNP (last 3 results) No results for input(s): "PROBNP" in the last 8760 hours.  CBG: No results for input(s): "GLUCAP" in the last 168 hours.  Radiological Exams on Admission: CT Chest W Contrast Result Date: 01/08/2024 CLINICAL DATA:  Chest pain and shortness of breath EXAM: CT CHEST WITH CONTRAST TECHNIQUE: Multidetector CT imaging of the chest was performed during intravenous contrast administration. RADIATION DOSE REDUCTION: This exam was performed according to the departmental dose-optimization program which includes automated exposure control, adjustment of the mA and/or kV according to patient size and/or use of iterative reconstruction technique. CONTRAST:  58mL OMNIPAQUE  IOHEXOL  350 MG/ML SOLN COMPARISON:  Earlier same day chest radiograph, CTA chest dated 04/11/2023 FINDINGS: Cardiovascular: Multichamber cardiomegaly. Mitral annular prosthesis. No significant pericardial fluid/thickening. Great vessels are normal in course and caliber. No central pulmonary emboli. Coronary artery calcifications and aortic atherosclerosis. Mediastinum/Nodes: Imaged thyroid  gland without nodules meeting criteria for imaging follow-up by size. Normal esophagus. Multi station lymphadenopathy, for example: 12 mm right supraclavicular lymph node (3:18), previously 10 mm, 11 mm right axillary  (3:31), previously 6 mm, and 10 mm pretracheal (3:52), unchanged. Lungs/Pleura: The central airways are patent. Diffuse interlobular septal thickening with scattered areas of a geometric ground-glass opacities. Thickening along the left major fissure. Bilateral lower lobe relaxation atelectasis. No pneumothorax. Increased moderate bilateral pleural effusions. Upper abdomen: Unchanged multifocal hepatic hypodensities measuring up to 1.8 cm in segment 2 (3: 135), likely cysts. Musculoskeletal: Median sternotomy wires are nondisplaced. 2.5 cm lipoma within the right posterior deltoid (3:17). IMPRESSION: 1. Pulmonary edema with increased moderate bilateral pleural effusions. 2. Multichamber cardiomegaly. 3. Multi station lymphadenopathy, likely reactive. 4. Aortic Atherosclerosis (ICD10-I70.0). Coronary artery calcifications. Assessment for potential risk factor modification, dietary therapy or pharmacologic therapy may be warranted,  if clinically indicated. Electronically Signed   By: Limin  Xu M.D.   On: 01/08/2024 19:35   DG Chest Portable 1 View Result Date: 01/08/2024 CLINICAL DATA:  Chest pain short of breath EXAM: PORTABLE CHEST 1 VIEW COMPARISON:  04/11/2023 FINDINGS: Sternotomy and valve prosthesis. Cardiomegaly with vascular congestion and small right and moderate left pleural effusions. Left greater than right bibasilar airspace disease. No pneumothorax IMPRESSION: Cardiomegaly with vascular congestion and small right and moderate left pleural effusions. Left greater than right bibasilar airspace disease, atelectasis versus pneumonia. Electronically Signed   By: Esmeralda Hedge M.D.   On: 01/08/2024 16:38    EKG: I independently viewed the EKG done and my findings are as followed: Atrial fibrillation rate of 103.  Nonspecific ST-T changes.  QTc 523.  Assessment/Plan Present on Admission: **None**  Principal Problem:   Acute on chronic congestive heart failure (HCC)  Acute on chronic  HFpEF Presented with progressive dyspnea BNP greater than 600, pulm edema and bilateral pleural effusions on imaging, right JVD IV diuresis as tolerated Last 2D echo done on 03/15/2022 revealed LVEF 55 to 60% Follow repeat 2D echo Monitor strict I's and O's and daily weight  Elevated troponin, suspect demand ischemia in the setting of acute on chronic HFpEF Troponin flat 59, 59 No evidence of acute ischemia on twelve-lead EKG Denies any chest pain at the time of this visit. Monitor on telemetry  Persistent atrial fibrillation on Eliquis  Resume home regimen Monitor on telemetry  Hyperlipidemia Resume home regimen  Generalized weakness PT OT assessment Fall precautions.  Prolonged QTc QTc 523 Avoid QTc prolonging agents Optimize magnesium  and potassium levels Monitor on telemetry  Obesity BMI 30 Recommend weight loss outpatient  Prior CVA Resume home regimen    Critical care time: 55 minutes.    DVT prophylaxis: Home Eliquis .  Code Status: Full code  Family Communication: None at bedside.  Disposition Plan: Admitted to telemetry cardiac unit.  None.  Consults called: None.  Admission status: Inpatient status.   Status is: Inpatient The patient requires at least 2 midnights for further evaluation and treatment of present condition.   Bary Boss MD [facility] Pager [phone]  If 7PM-7AM, please contact night-coverage [email or web address] Password [email or web address]  01/09/2024, 6:44 AM

## 2024-01-09 NOTE — Assessment & Plan Note (Signed)
-   Flat trend with no reports of chest pain on admission - Troponin leak suspected in setting of RVR

## 2024-01-10 DIAGNOSIS — N3000 Acute cystitis without hematuria: Secondary | ICD-10-CM | POA: Diagnosis not present

## 2024-01-10 DIAGNOSIS — I5043 Acute on chronic combined systolic (congestive) and diastolic (congestive) heart failure: Secondary | ICD-10-CM | POA: Diagnosis not present

## 2024-01-10 DIAGNOSIS — J9601 Acute respiratory failure with hypoxia: Secondary | ICD-10-CM | POA: Diagnosis not present

## 2024-01-10 DIAGNOSIS — I10 Essential (primary) hypertension: Secondary | ICD-10-CM

## 2024-01-10 DIAGNOSIS — I4819 Other persistent atrial fibrillation: Secondary | ICD-10-CM

## 2024-01-10 LAB — CBC WITH DIFFERENTIAL/PLATELET
Abs Immature Granulocytes: 0.06 10*3/uL (ref 0.00–0.07)
Basophils Absolute: 0 10*3/uL (ref 0.0–0.1)
Basophils Relative: 0 %
Eosinophils Absolute: 0 10*3/uL (ref 0.0–0.5)
Eosinophils Relative: 0 %
HCT: 32 % — ABNORMAL LOW (ref 39.0–52.0)
Hemoglobin: 9.6 g/dL — ABNORMAL LOW (ref 13.0–17.0)
Immature Granulocytes: 1 %
Lymphocytes Relative: 7 %
Lymphs Abs: 0.9 10*3/uL (ref 0.7–4.0)
MCH: 23.8 pg — ABNORMAL LOW (ref 26.0–34.0)
MCHC: 30 g/dL (ref 30.0–36.0)
MCV: 79.2 fL — ABNORMAL LOW (ref 80.0–100.0)
Monocytes Absolute: 1.2 10*3/uL — ABNORMAL HIGH (ref 0.1–1.0)
Monocytes Relative: 9 %
Neutro Abs: 10.4 10*3/uL — ABNORMAL HIGH (ref 1.7–7.7)
Neutrophils Relative %: 83 %
Platelets: 226 10*3/uL (ref 150–400)
RBC: 4.04 MIL/uL — ABNORMAL LOW (ref 4.22–5.81)
RDW: 17.5 % — ABNORMAL HIGH (ref 11.5–15.5)
WBC: 12.6 10*3/uL — ABNORMAL HIGH (ref 4.0–10.5)
nRBC: 0 % (ref 0.0–0.2)

## 2024-01-10 LAB — BASIC METABOLIC PANEL WITH GFR
Anion gap: 14 (ref 5–15)
BUN: 14 mg/dL (ref 8–23)
CO2: 24 mmol/L (ref 22–32)
Calcium: 9 mg/dL (ref 8.9–10.3)
Chloride: 101 mmol/L (ref 98–111)
Creatinine, Ser: 0.89 mg/dL (ref 0.61–1.24)
GFR, Estimated: >60 mL/min (ref >60)
Glucose, Bld: 126 mg/dL — ABNORMAL HIGH (ref 70–99)
Potassium: 3.4 mmol/L — ABNORMAL LOW (ref 3.5–5.1)
Sodium: 139 mmol/L (ref 135–145)

## 2024-01-10 LAB — GLUCOSE, CAPILLARY: Glucose-Capillary: 118 mg/dL — ABNORMAL HIGH (ref 70–99)

## 2024-01-10 LAB — MAGNESIUM: Magnesium: 1.6 mg/dL — ABNORMAL LOW (ref 1.7–2.4)

## 2024-01-10 MED ORDER — SPIRONOLACTONE 12.5 MG HALF TABLET
12.5000 mg | ORAL_TABLET | Freq: Every day | ORAL | Status: DC
  Administered 2024-01-10 – 2024-01-11 (×2): 12.5 mg via ORAL
  Filled 2024-01-10 (×2): qty 1

## 2024-01-10 MED ORDER — LOSARTAN POTASSIUM 25 MG PO TABS
12.5000 mg | ORAL_TABLET | Freq: Every day | ORAL | Status: DC
  Administered 2024-01-10 – 2024-01-11 (×2): 12.5 mg via ORAL
  Filled 2024-01-10 (×2): qty 1

## 2024-01-10 MED ORDER — FUROSEMIDE 40 MG PO TABS
40.0000 mg | ORAL_TABLET | Freq: Every day | ORAL | Status: DC
  Administered 2024-01-11: 40 mg via ORAL
  Filled 2024-01-10: qty 1

## 2024-01-10 MED ORDER — MAGNESIUM SULFATE 2 GM/50ML IV SOLN
2.0000 g | Freq: Once | INTRAVENOUS | Status: AC
  Administered 2024-01-10: 2 g via INTRAVENOUS
  Filled 2024-01-10: qty 50

## 2024-01-10 MED ORDER — POTASSIUM CHLORIDE CRYS ER 20 MEQ PO TBCR
40.0000 meq | EXTENDED_RELEASE_TABLET | Freq: Once | ORAL | Status: AC
  Administered 2024-01-10: 40 meq via ORAL
  Filled 2024-01-10: qty 2

## 2024-01-10 MED ORDER — METOPROLOL SUCCINATE ER 25 MG PO TB24
12.5000 mg | ORAL_TABLET | Freq: Every day | ORAL | Status: DC
Start: 2024-01-11 — End: 2024-01-11
  Administered 2024-01-11: 12.5 mg via ORAL
  Filled 2024-01-10: qty 1

## 2024-01-10 NOTE — Consult Note (Addendum)
 Cardiology Consultation   Patient ID: Brian [name] MRN: [id number]; DOB: [birth year]  Admit date: 01/08/2024 Date of Consult: 01/10/2024  PCP:  Tonna Frederic, MD   [facility] Providers Cardiologist:  Maudine Sos, MD   {   Patient Profile:   Brian [name] is a 89 y.o. male with a hx of persistent A. Fib, GI bleed, prior CVA, hypertension, history of MVR at [facility] (2002), chronic HFpEF, multiple active pressure wounds being actively seen/treated by wound care who is being seen 01/10/2024 for the evaluation of acute on chronic HFpEF at the request of Dr. Gladstone Lamer.  History of Present Illness:   [name] has past medical history as listed above. He presented to [name] ED from doctor's office on 01/08/2024 for chest pain, shortness of breath. He had been feeling these symptoms for the past few days, went to PCP, who sent him to the ED.   Relevant workup in the ED/since admission includes: CBC showed leukocytosis, anemia, BMP showed hypokalemia, Mag 1.6, BNP 640, troponin 59 > 59, UA showed UTI, preliminary urine culture positive for Klebsiella. CXR showed cardiomegaly, vascular congestion, bilateral pleural effusions, possible PNA. CT chest showed pulmonary edema, bilateral pleural effusions, coronary artery calcification.  He was admitted to medicine service. Since admission he has been given IV Lasix  20 mg x 2 doses, continued on his home Eliquis  2.5 mg BID, Toprol  25 mg daily, pravastatin  40 mg daily, started on IV rocephin .   Cardiology was consulted for acute on chronic HFpEF.   He is a patient of Dr. [name] and was recently seen by Neomi Banks, NP on 04/24/2023 for follow up. That this visit he noted some left-sided chest discomfort, that was tender to palpation, similar to prior. His shortness of breath and LE edema had improved since starting Lasix  (20 mg daily + PRN Furoscix ). There were no issues at this time.   After speaking with the patient, he is  alone in his room with no family present.  He is a bit confused, unsure if this is his baseline.  He states that he began feeling short of breath a couple of days ago. He is not really able to tell me much in terms of history but he states that he thinks he feels a bit better than yesterday. He notes that he would be fine being able to take medications but he does not want a device implanted in him.   Past Medical History:  Diagnosis Date   Anemia    Atrial flutter (HCC)    BPH (benign prostatic hypertrophy)    Epistaxis    Hemorrhage of gastrointestinal tract, unspecified    History of open heart surgery    Hypertension    Obesity (BMI 30.0-34.9) 06/29/2020   Osteoarthrosis, unspecified whether generalized or localized, unspecified site    Personal history of venous thrombosis and embolism    Rosacea    Stroke (HCC)    TIA (transient ischemic attack)    Past Surgical History:  Procedure Laterality Date   APPENDECTOMY     HERNIA REPAIR     IR GENERIC HISTORICAL  05/04/2016   IR PERCUTANEOUS ART THROMBECTOMY/INFUSION INTRACRANIAL INC DIAG ANGIO 05/04/2016 Luellen Sages, MD [facility]   IR GENERIC HISTORICAL  05/04/2016   IR ANGIO VERTEBRAL SEL SUBCLAVIAN INNOMINATE UNI R MOD SED 05/04/2016 Luellen Sages, MD [facility]   IR GENERIC HISTORICAL  06/07/2016   IR RADIOLOGIST EVAL & MGMT 06/07/2016 [facility]   JOINT  REPLACEMENT     MITRAL VALVE REPAIR     mvp repair     RADIOLOGY WITH ANESTHESIA N/A 05/04/2016   Procedure: RADIOLOGY WITH ANESTHESIA;  Surgeon: Luellen Sages, MD;  Location: [facility] OR;  Service: Radiology;  Laterality: N/A;   TOTAL HIP ARTHROPLASTY      Home Medications:  Prior to Admission medications   Medication Sig Start Date End Date Taking? Authorizing Provider  ALPRAZolam  (XANAX ) 0.25 MG tablet Take 0.25 mg by mouth 2 (two) times daily as needed for anxiety.   Yes [provider]  cetirizine  (ZYRTEC ) 10 MG tablet TAKE 1 TABLET(10 MG) BY MOUTH  AT BEDTIME Patient taking differently: Take 10 mg by mouth daily. 12/17/23  Yes Tonna Frederic, MD  clotrimazole  (LOTRIMIN ) 1 % cream Apply 1 Application topically 2 (two) times daily as needed (for rash).   Yes [provider]  ELIQUIS  2.5 MG TABS tablet TAKE 1 TABLET(2.5 MG) BY MOUTH TWICE DAILY 09/13/23  Yes Tonna Frederic, MD  finasteride  (PROSCAR ) 5 MG tablet TAKE 1 TABLET(5 MG) BY MOUTH DAILY 09/10/23  Yes Tonna Frederic, MD  fluticasone [facility]) 50 MCG/ACT nasal spray Place 1 spray into both nostrils daily as needed for allergies. 12/24/20  Yes [provider]  furosemide  (LASIX ) 40 MG tablet Take 1 tablet (40 mg total) by mouth daily. 11/13/23  Yes Tonna Frederic, MD  gentamicin ointment (GARAMYCIN) 0.1 % Apply topically as needed (pressure sore). 04/18/23  Yes [provider]  hydrocortisone  ointment 0.5 % Apply 1 Application topically daily as needed for itching.   Yes [provider]  melatonin 5 MG TABS Take 5 mg by mouth at bedtime as needed (sleep).   Yes [provider]  metoprolol  succinate (TOPROL -XL) 25 MG 24 hr tablet Take 1 tablet (25 mg total) by mouth daily. 11/13/23  Yes Tonna Frederic, MD  Multiple Vitamin (MULTIVITAMIN) tablet Take 1 tablet by mouth 2 (two) times daily.   Yes [provider]  MUPIROCIN EX Apply 1 Application topically daily as needed (wound care).   Yes [provider]  oxyCODONE -acetaminophen  (PERCOCET) 7.5-325 MG tablet Take 1 tablet by mouth 2 (two) times daily as needed for severe pain (pain score 7-10). Patient taking differently: Take 1 tablet by mouth 2 (two) times daily. 01/02/24 02/01/24 Yes Patel, Seema K, NP  pantoprazole  (PROTONIX ) 40 MG tablet Take 1 tablet (40 mg total) by mouth daily. 11/13/23  Yes Tonna Frederic, MD  polyethylene glycol powder (GLYCOLAX /MIRALAX ) 17 GM/SCOOP powder Mix one scoop with water and take daily until stools loosen. May  repeat as needed. Patient taking differently: Take 17 g by mouth daily as needed for mild constipation. 01/20/20  Yes Tonna Frederic, MD  pravastatin  (PRAVACHOL ) 40 MG tablet TAKE 1 TABLET(40 MG) BY MOUTH DAILY 10/26/22  Yes Tonna Frederic, MD  pregabalin  (LYRICA ) 25 MG capsule Take 2 capsules (50 mg total) by mouth at bedtime. 11/27/23 02/25/24 Yes Patel, Seema K, NP  QUEtiapine  (SEROQUEL ) 50 MG tablet TAKE 1 TABLET(50 MG) BY MOUTH AT BEDTIME 11/13/23  Yes Tonna Frederic, MD  sertraline  (ZOLOFT ) 50 MG tablet TAKE 1 TABLET(50 MG) BY MOUTH EVERY MORNING 12/03/23  Yes Tonna Frederic, MD  tamsulosin  (FLOMAX ) 0.4 MG CAPS capsule TAKE 1 CAPSULE(0.4 MG) BY MOUTH DAILY 08/21/23  Yes Tonna Frederic, MD  trimethoprim (TRIMPEX) 100 MG tablet Take 100 mg by mouth at bedtime. 12/12/23  Yes [provider]  oxyCODONE -acetaminophen  (PERCOCET) 7.5-325 MG  tablet Take 1 tablet by mouth 2 (two) times daily as needed for severe pain (pain score 7-10). Patient not taking: Reported on 01/08/2024 02/01/24 03/02/24  Patel, Seema K, NP   Inpatient Medications: Scheduled Meds:  apixaban   2.5 mg Oral BID   finasteride   5 mg Oral Daily   [START ON 01/11/2024] furosemide   40 mg Oral Daily   losartan   12.5 mg Oral Daily   [START ON 01/11/2024] metoprolol  succinate  12.5 mg Oral Daily   pantoprazole   40 mg Oral Daily   pravastatin   40 mg Oral q1800   pregabalin   50 mg Oral QHS   QUEtiapine   25 mg Oral QHS   sertraline   50 mg Oral Daily   spironolactone  12.5 mg Oral Daily   tamsulosin   0.4 mg Oral QPC supper   Continuous Infusions:  cefTRIAXone  (ROCEPHIN )  IV 1 g (01/09/24 1752)   PRN Meds: acetaminophen , ALPRAZolam , melatonin, polyethylene glycol, prochlorperazine  Allergies:    Allergies  Allergen Reactions   Novocain [Procaine Hcl]     Irregular heart beat      Other     Patient had problem with epidural with his right hip surgery. Went to up extremity instead of lower  extremity    Penicillins Itching    Has patient had a PCN reaction causing immediate rash, facial/tongue/throat swelling, SOB or lightheadedness with hypotension: Unk Has patient had a PCN reaction causing severe rash involving mucus membranes or skin necrosis: Unk Has patient had a PCN reaction that required hospitalization: Unk Has patient had a PCN reaction occurring within the last 10 years: No If all of the above answers are "NO", then may proceed with Cephalosporin use.  No reaction noted   Social History:   Social History   Socioeconomic History   Marital status: Married    Spouse name: Not on file   Number of children: Not on file   Years of education: Not on file   Highest education level: Not on file  Occupational History   Not on file  Tobacco Use   Smoking status: Former   Smokeless tobacco: Never   Tobacco comments:    quit 50 yr ago  Vaping Use   Vaping status: Never Used  Substance and Sexual Activity   Alcohol use: No   Drug use: No   Sexual activity: Not on file  Other Topics Concern   Not on file  Social History Narrative   Not on file   Social Drivers of Health   Financial Resource Strain: Low Risk  (09/16/2021)   Overall Financial Resource Strain (CARDIA)    Difficulty of Paying Living Expenses: Not hard at all  Food Insecurity: No Food Insecurity (01/09/2024)   Hunger Vital Sign    Worried About Running Out of Food in the Last Year: Never true    Ran Out of Food in the Last Year: Never true  Transportation Needs: No Transportation Needs (01/09/2024)   PRAPARE - Administrator, Civil Service (Medical): No    Lack of Transportation (Non-Medical): No  Physical Activity: Not on file  Stress: Not on file  Social Connections: Patient Declined (01/09/2024)   Social Connection and Isolation Panel [NHANES]    Frequency of Communication with Friends and Family: Patient declined    Frequency of Social Gatherings with Friends and Family: Patient  declined    Attends Religious Services: Patient declined    Database administrator or Organizations: Patient declined  Attends Banker Meetings: Patient declined    Marital Status: Patient declined  Intimate Partner Violence: Not At Risk (01/09/2024)   Humiliation, Afraid, Rape, and Kick questionnaire    Fear of Current or Ex-Partner: No    Emotionally Abused: No    Physically Abused: No    Sexually Abused: No    Family History:   Family History  Problem Relation Age of Onset   Rheumatic fever Mother    Coronary artery disease Father     ROS:  Please see the history of present illness.  All other ROS reviewed and negative.     Physical Exam/Data:   Vitals:   01/10/24 0729 01/10/24 0951 01/10/24 1049 01/10/24 1326  BP: 121/76 103/61 (!) 122/56 113/68  Pulse: 98  84   Resp: 18 16 20 20   Temp: 98.5 F (36.9 C)  98.5 F (36.9 C)   TempSrc: Oral  Oral   SpO2: 99%  98%   Weight:      Height:        Intake/Output Summary (Last 24 hours) at 01/10/2024 1521 Last data filed at 01/10/2024 0421 Gross per 24 hour  Intake 360 ml  Output 600 ml  Net -240 ml      01/10/2024   12:08 AM 01/09/2024   11:34 AM 01/08/2024    2:56 PM  Last 3 Weights  Weight (lbs) 182 lb 8.7 oz 191 lb 12.8 oz 191 lb 12.8 oz  Weight (kg) 82.8 kg 87 kg 87 kg     Body mass index is 29.46 kg/m.   General:  frail, elderly male, in no acute distress HEENT: normal Neck: no JVD Vascular: Distal pulses 2+ bilaterally Cardiac:  normal S1, S2; RRR; no murmur  Lungs:  decreased breath sounds   Abd: soft, nontender, no hepatomegaly  Ext: trace edema Musculoskeletal:  No deformities Skin: warm and dry  Neuro:  no focal abnormalities noted Psych:  Normal affect   EKG:  The EKG was personally reviewed and demonstrates:  A. Fib with known LBBB< HR 103, no acute ischemic changes   Telemetry:  Telemetry was personally reviewed and demonstrates:  A. Fib HR 80s  Relevant CV  Studies:  Echocardiogram, 01/09/2024 Left ventricular ejection fraction, by estimation, is 20 to 25% . The left ventricle has severely decreased function. The left ventricle demonstrates global hypokinesis. The left ventricular internal cavity size was mildly dilated. Left ventricular diastolic parameters are indeterminate.  Right ventricular systolic function is mildly reduced. The right ventricular size is normal. There is normal pulmonary artery systolic pressure.  Left atrial size was mildly dilated.  Right atrial size was severely dilated.  Moderate pleural effusion in the left lateral region.  The mitral valve is normal in structure. Trivial mitral valve regurgitation. No evidence of mitral stenosis.  The aortic valve is tricuspid. Aortic valve regurgitation is mild. No aortic stenosis is present.  The inferior vena cava is normal in size with greater than 50% respiratory variability, suggesting right atrial pressure of 3 mmHg.  Laboratory Data:  High Sensitivity Troponin:   Recent Labs  Lab 01/08/24 1452 01/08/24 1720  TROPONINIHS 59* 59*     Chemistry Recent Labs  Lab 01/08/24 1452 01/09/24 0443 01/10/24 0254  NA 138 138 139  K 4.3 4.2 3.4*  CL 103 103 101  CO2 25 20* 24  GLUCOSE 107* 127* 126*  BUN 13 15 14   CREATININE 0.94 0.87 0.89  CALCIUM  8.9 9.2 9.0  MG 1.8 1.8 1.6*  GFRNONAA >60 >60 >60  ANIONGAP 10 15 14     No results for input(s): "PROT", "ALBUMIN", "AST", "ALT", "ALKPHOS", "BILITOT" in the last 168 hours. Lipids No results for input(s): "CHOL", "TRIG", "HDL", "LABVLDL", "LDLCALC", "CHOLHDL" in the last 168 hours.  Hematology Recent Labs  Lab 01/08/24 1452 01/09/24 0443 01/10/24 0254  WBC 9.2 11.2* 12.6*  RBC 4.08* 4.67 4.04*  HGB 9.7* 11.0* 9.6*  HCT 32.9* 37.2* 32.0*  MCV 80.6 79.7* 79.2*  MCH 23.8* 23.6* 23.8*  MCHC 29.5* 29.6* 30.0  RDW 17.1* 17.4* 17.5*  PLT 209 250 226   Thyroid  No results for input(s): "TSH", "FREET4" in the last 168  hours.  BNP Recent Labs  Lab 01/08/24 1536  BNP 640.6*    DDimer No results for input(s): "DDIMER" in the last 168 hours.  Radiology/Studies:  ECHOCARDIOGRAM COMPLETE Result Date: 01/09/2024    ECHOCARDIOGRAM REPORT   Patient Name:   KAINALU HEGGS Date of Exam: 01/09/2024 Medical Rec #:  [id number]      Height:       66.0 in Accession #:    [id number]     Weight:       191.8 lb Date of Birth:  Jun 19, 1933      BSA:          1.965 m Patient Age:    89 years       BP:           122/86 mmHg Patient Gender: M              HR:           88 bpm. Exam Location:  Inpatient Procedure: 2D Echo, Cardiac Doppler, Color Doppler and Intracardiac            Opacification Agent (Both Spectral and Color Flow Doppler were            utilized during procedure). Indications:    CHF  History:        Patient has prior history of Echocardiogram examinations, most                 recent 03/15/2022. Risk Factors:Hypertension.  Sonographer:    Janette Medley Referring Phys: CAROLE N HALL IMPRESSIONS  1. Left ventricular ejection fraction, by estimation, is 20 to 25%. The left ventricle has severely decreased function. The left ventricle demonstrates global hypokinesis. The left ventricular internal cavity size was mildly dilated. Left ventricular diastolic parameters are indeterminate.  2. Right ventricular systolic function is mildly reduced. The right ventricular size is normal. There is normal pulmonary artery systolic pressure.  3. Left atrial size was mildly dilated.  4. Right atrial size was severely dilated.  5. Moderate pleural effusion in the left lateral region.  6. The mitral valve is normal in structure. Trivial mitral valve regurgitation. No evidence of mitral stenosis.  7. The aortic valve is tricuspid. Aortic valve regurgitation is mild. No aortic stenosis is present.  8. The inferior vena cava is normal in size with greater than 50% respiratory variability, suggesting right atrial pressure of 3 mmHg. FINDINGS  Left  Ventricle: Left ventricular ejection fraction, by estimation, is 20 to 25%. The left ventricle has severely decreased function. The left ventricle demonstrates global hypokinesis. The left ventricular internal cavity size was mildly dilated. There is no left ventricular hypertrophy. Left ventricular diastolic parameters are indeterminate. Right Ventricle: The right ventricular size is normal. No increase in right ventricular wall thickness. Right ventricular systolic function is mildly reduced.  There is normal pulmonary artery systolic pressure. The tricuspid regurgitant velocity is 2.31 m/s, and with an assumed right atrial pressure of 3 mmHg, the estimated right ventricular systolic pressure is 24.3 mmHg. Left Atrium: Left atrial size was mildly dilated. Right Atrium: Right atrial size was severely dilated. Pericardium: There is no evidence of pericardial effusion. Mitral Valve: The mitral valve is normal in structure. Trivial mitral valve regurgitation. No evidence of mitral valve stenosis. Tricuspid Valve: The tricuspid valve is normal in structure. Tricuspid valve regurgitation is trivial. No evidence of tricuspid stenosis. Aortic Valve: The aortic valve is tricuspid. Aortic valve regurgitation is mild. No aortic stenosis is present. Aortic valve mean gradient measures 4.0 mmHg. Aortic valve peak gradient measures 7.6 mmHg. Aortic valve area, by VTI measures 2.99 cm. Pulmonic Valve: The pulmonic valve was normal in structure. Pulmonic valve regurgitation is trivial. No evidence of pulmonic stenosis. Aorta: The aortic root is normal in size and structure. Venous: The inferior vena cava is normal in size with greater than 50% respiratory variability, suggesting right atrial pressure of 3 mmHg. IAS/Shunts: No atrial level shunt detected by color flow Doppler. Additional Comments: There is a moderate pleural effusion in the left lateral region.  LEFT VENTRICLE PLAX 2D LVIDd:         5.90 cm   Diastology LVIDs:          5.30 cm   LV e' lateral: 10.10 cm/s LV PW:         1.00 cm LV IVS:        1.10 cm LVOT diam:     2.30 cm LV SV:         64 LV SV Index:   33 LVOT Area:     4.15 cm  RIGHT VENTRICLE             IVC RV S prime:     11.60 cm/s  IVC diam: 2.40 cm TAPSE (M-mode): 1.4 cm LEFT ATRIUM             Index        RIGHT ATRIUM           Index LA diam:        3.90 cm 1.99 cm/m   RA Area:     28.10 cm LA Vol (A2C):   89.8 ml 45.71 ml/m  RA Volume:   101.00 ml 51.41 ml/m LA Vol (A4C):   59.6 ml 30.34 ml/m LA Biplane Vol: 74.7 ml 38.02 ml/m  AORTIC VALVE AV Area (Vmax):    3.16 cm AV Area (Vmean):   3.05 cm AV Area (VTI):     2.99 cm AV Vmax:           138.00 cm/s AV Vmean:          95.500 cm/s AV VTI:            0.214 m AV Peak Grad:      7.6 mmHg AV Mean Grad:      4.0 mmHg LVOT Vmax:         105.00 cm/s LVOT Vmean:        70.200 cm/s LVOT VTI:          0.154 m LVOT/AV VTI ratio: 0.72  AORTA Ao Root diam: 3.40 cm Ao Asc diam:  3.20 cm TRICUSPID VALVE TR Peak grad:   21.3 mmHg TR Vmax:        231.00 cm/s  SHUNTS Systemic VTI:  0.15 m Systemic Diam: 2.30  cm Maudine Sos MD Electronically signed by Maudine Sos MD Signature Date/Time: 01/09/2024/1:47:23 PM    Final    CT Chest W Contrast Result Date: 01/08/2024 CLINICAL DATA:  Chest pain and shortness of breath EXAM: CT CHEST WITH CONTRAST TECHNIQUE: Multidetector CT imaging of the chest was performed during intravenous contrast administration. RADIATION DOSE REDUCTION: This exam was performed according to the departmental dose-optimization program which includes automated exposure control, adjustment of the mA and/or kV according to patient size and/or use of iterative reconstruction technique. CONTRAST:  58mL OMNIPAQUE  IOHEXOL  350 MG/ML SOLN COMPARISON:  Earlier same day chest radiograph, CTA chest dated 04/11/2023 FINDINGS: Cardiovascular: Multichamber cardiomegaly. Mitral annular prosthesis. No significant pericardial fluid/thickening. Great vessels are  normal in course and caliber. No central pulmonary emboli. Coronary artery calcifications and aortic atherosclerosis. Mediastinum/Nodes: Imaged thyroid  gland without nodules meeting criteria for imaging follow-up by size. Normal esophagus. Multi station lymphadenopathy, for example: 12 mm right supraclavicular lymph node (3:18), previously 10 mm, 11 mm right axillary (3:31), previously 6 mm, and 10 mm pretracheal (3:52), unchanged. Lungs/Pleura: The central airways are patent. Diffuse interlobular septal thickening with scattered areas of a geometric ground-glass opacities. Thickening along the left major fissure. Bilateral lower lobe relaxation atelectasis. No pneumothorax. Increased moderate bilateral pleural effusions. Upper abdomen: Unchanged multifocal hepatic hypodensities measuring up to 1.8 cm in segment 2 (3: 135), likely cysts. Musculoskeletal: Median sternotomy wires are nondisplaced. 2.5 cm lipoma within the right posterior deltoid (3:17). IMPRESSION: 1. Pulmonary edema with increased moderate bilateral pleural effusions. 2. Multichamber cardiomegaly. 3. Multi station lymphadenopathy, likely reactive. 4. Aortic Atherosclerosis (ICD10-I70.0). Coronary artery calcifications. Assessment for potential risk factor modification, dietary therapy or pharmacologic therapy may be warranted, if clinically indicated. Electronically Signed   By: Limin  Xu M.D.   On: 01/08/2024 19:35   DG Chest Portable 1 View Result Date: 01/08/2024 CLINICAL DATA:  Chest pain short of breath EXAM: PORTABLE CHEST 1 VIEW COMPARISON:  04/11/2023 FINDINGS: Sternotomy and valve prosthesis. Cardiomegaly with vascular congestion and small right and moderate left pleural effusions. Left greater than right bibasilar airspace disease. No pneumothorax IMPRESSION: Cardiomegaly with vascular congestion and small right and moderate left pleural effusions. Left greater than right bibasilar airspace disease, atelectasis versus pneumonia.  Electronically Signed   By: Esmeralda Hedge M.D.   On: 01/08/2024 16:38     Assessment and Plan:   Acute on chronic HFpEF with newly reduced EF Patient presented with chest pain, shortness of breath  Troponin 59 > 59, likely demand ischemia BNP 640 CXR showed cardiomegaly, pleural effusions Chest CT showed pulmonary edema, moderate bilateral effusions, multi chamber cardiomegaly, coronary artery calcifications  Echo this admission: EF 20-25% (previously 55-60%), global hypokinesis, mildly reduced RV function, biatrial enlargement, moderate pleural effusion Home meds: PO Lasix  40 mg daily, Toprol  25 mg daily Creatinine stable, 0.89 today  Given IV Lasix  20 mg x 2 He denies any orthopnea, no current chest pain, breathing is better Currently on 2 L of oxygen via [place] with SpO2 98% Patient states he is fine with medical therapy but will refuse a pacemaker  Continue Toprol  12.5 mg daily Continue losartan  12.5 mg daily Add spironolactone 12.5 mg daily   Resume home PO Lasix  40 mg daily  Continue strict I&O's, daily weights, daily BMPs Replenish electrolytes as needed, per primary team   Persistent atrial fibrillation Hypertension  Typically on low-dose Eliquis  due to history of GI bleeds Currently in A. Fib with HR 80-90s BP stable Continue Eliquis  2.5 mg BID  Continue Toprol  12.5 mg daily  Continue losartan  12.5 mg daily   Per primary Prior CVA Hyperlipidemia  UTI  Likely PNA Generalized weakness Obesity  Electrolyte disturbances   Risk Assessment/Risk Scores:       New [place]  Heart Association (NYHA) Functional Class NYHA Class III  CHA2DS2-VASc Score = 7   This indicates a 11.2% annual risk of stroke. The patient's score is based upon: CHF History: 1 HTN History: 1 Diabetes History: 0 Stroke History: 2 Vascular Disease History: 1 Age Score: 2 Gender Score: 0         For questions or updates, please contact [facility] Please consult [email or web address] for  contact info under    Signed, Jiles Mote, PA-C  01/10/2024 3:21 PM  Personally seen and examined. Agree with above.  89 year old with new onset systolic heart failure with EF 20 to 25% previously 60% who presented with chest pain and shortness of breath.  Troponin 59 and flat representative myocardial injury mild in the setting of systolic heart failure.  Not ACS BNP 640 chest x-ray with pleural effusions and cardiomegaly possibly representing chronicity of reduced ejection fraction. - Already given Lasix  20 mg IV x 2 feeling better no significant orthopnea at this point. - Repeats that he does not want a pacemaker.  Reassured him that he would not need 1.  Given his advanced age would not be a candidate for ICD.  Perhaps mild confusion taking place. -Continue goal-directed medical therapy which includes Toprol  12.5, losartan  12.5, spironolactone 12.5.  We went ahead and resumed his home Lasix  40 mg p.o. daily.  Persistent atrial fibrillation - Continuing with Toprol  12.5 for rate control - On low-dose Eliquis  secondary to history of GI bleeds.  Continue to monitor for any signs of bleeding.  Has a prior history of stroke as of 9. He has had multiple active pressure wounds being treated by wound clinic.  Continue with ongoing supportive care.  Dorothye Gathers, MD

## 2024-01-10 NOTE — TOC CM/SW Note (Addendum)
 Transition of Care [facility]) - Inpatient Brief Assessment   Patient Details  Name: Brian [name] MRN: [id number] Date of Birth: 21-Oct-1932  Transition of [facility]) CM/SW Contact:    Jennett Model, RN Phone Number: 01/10/2024, 2:50 PM   Clinical Narrative: From home with spouse, has PCP and insurance on file, states has [facility] services in place with [facility], HHPT, HHRN,  and would like to continue with them, he has w/chair and walker at home.  States son  will transport them home at [employer] and family is support system, states gets medications from [facility] in [place].  Pta w/chair bound, does not ambulate. Patient gives this NCM permission to speak with his son , Garr Kalata and his daughter, Storm Ellen.  NCM confirmed with Amy with [employer] he is active with them, and she will let this NCM know which services. Patient says he will need home oxygen.  He has no preference of DME agency for oxygen.  NCM made referral to Marlou Sims with [employer] for home oxygen.  He will deliver oxygen to the room in the am.     Transition of Care Asessment: Insurance and Status: Insurance coverage has been reviewed Patient has primary care physician: Yes Home environment has been reviewed: lives with wife and son Prior level of function:: w/chair bound Prior/Current Home Services: Current home services (w/chair, walker) Social Drivers of Health Review: SDOH reviewed no interventions necessary Readmission risk has been reviewed: Yes Transition of care needs: transition of care needs identified, TOC will continue to follow

## 2024-01-10 NOTE — Plan of Care (Addendum)
 Pt resting throughout shift. Aox4, no c/o pain. VSS. Pt tolerating diet. Reports constipation, given PRN miralax . Voiding w/o difficulty. Safety precautions in place, call light within reach.   Problem: Education: Goal: Knowledge of General Education information will improve Description: Including pain rating scale, medication(s)/side effects and non-pharmacologic comfort measures Outcome: Progressing   Problem: Health Behavior/Discharge Planning: Goal: Ability to manage health-related needs will improve Outcome: Progressing   Problem: Clinical Measurements: Goal: Ability to maintain clinical measurements within normal limits will improve Outcome: Progressing Goal: Will remain free from infection Outcome: Progressing Goal: Diagnostic test results will improve Outcome: Progressing Goal: Respiratory complications will improve Outcome: Progressing Goal: Cardiovascular complication will be avoided Outcome: Progressing   Problem: Activity: Goal: Risk for activity intolerance will decrease Outcome: Progressing   Problem: Nutrition: Goal: Adequate nutrition will be maintained Outcome: Progressing   Problem: Coping: Goal: Level of anxiety will decrease Outcome: Progressing   Problem: Elimination: Goal: Will not experience complications related to bowel motility Outcome: Progressing Goal: Will not experience complications related to urinary retention Outcome: Progressing   Problem: Pain Managment: Goal: General experience of comfort will improve and/or be controlled Outcome: Progressing   Problem: Safety: Goal: Ability to remain free from injury will improve Outcome: Progressing   Problem: Skin Integrity: Goal: Risk for impaired skin integrity will decrease Outcome: Progressing

## 2024-01-10 NOTE — Plan of Care (Signed)

## 2024-01-10 NOTE — Progress Notes (Signed)
 This encounter was created in error - please disregard.

## 2024-01-10 NOTE — Progress Notes (Signed)
 Patient initially on 2L nasal cannula with SpO2 at 99%. Trialed off oxygen while sitting upright in bed; SpO2 remained stable at 98% with no signs of respiratory distress. Upon repositioning patient to a flat supine position SpO2 decreased to 94% with audible wheezing noted. When turned to side, SpO2 declined further to 88% on RA. Oxygen therapy resumed at 2L nasal cannula, resulting in improvement of SpO2 to 96%. Patient tolerated re-initiation of oxygen well.

## 2024-01-10 NOTE — Progress Notes (Signed)
 Attempted to wean patient off of oxygen but his O2 sats drop to 88% and patient states he feels like he can't breathe.

## 2024-01-10 NOTE — Evaluation (Signed)
 Clinical/Bedside Swallow Evaluation Patient Details  Name: Brian [name] MRN: [id number] Date of Birth: Apr 28, 1933  Today's Date: 01/10/2024 Time: SLP Start Time (ACUTE ONLY): 1010 SLP Stop Time (ACUTE ONLY): 1041 SLP Time Calculation (min) (ACUTE ONLY): 31 min  Past Medical History:  Past Medical History:  Diagnosis Date   Anemia    Atrial flutter (HCC)    BPH (benign prostatic hypertrophy)    Epistaxis    Hemorrhage of gastrointestinal tract, unspecified    History of open heart surgery    Hypertension    Obesity (BMI 30.0-34.9) 06/29/2020   Osteoarthrosis, unspecified whether generalized or localized, unspecified site    Personal history of venous thrombosis and embolism    Rosacea    Stroke (HCC)    TIA (transient ischemic attack)    Past Surgical History:  Past Surgical History:  Procedure Laterality Date   APPENDECTOMY     HERNIA REPAIR     IR GENERIC HISTORICAL  05/04/2016   IR PERCUTANEOUS ART THROMBECTOMY/INFUSION INTRACRANIAL INC DIAG ANGIO 05/04/2016 Brian Sages, MD [facility]   IR GENERIC HISTORICAL  05/04/2016   IR ANGIO VERTEBRAL SEL SUBCLAVIAN INNOMINATE UNI R MOD SED 05/04/2016 Brian Sages, MD [facility]   IR GENERIC HISTORICAL  06/07/2016   IR RADIOLOGIST EVAL & MGMT 06/07/2016 [facility]   JOINT REPLACEMENT     MITRAL VALVE REPAIR     mvp repair     RADIOLOGY WITH ANESTHESIA N/A 05/04/2016   Procedure: RADIOLOGY WITH ANESTHESIA;  Surgeon: Brian Sages, MD;  Location: [facility] OR;  Service: Radiology;  Laterality: N/A;   TOTAL HIP ARTHROPLASTY     HPI:  Brian [name] is a 89 year old male who presented with worsening shortness of breath.  He is not on oxygen at home and was found to also be hypoxic on workup in the ER.  CXR and CT chest showed pulmonary edema with moderate bilateral pleural effusions.  BNP elevated, 640.  He denied any chest pain.  Troponin trend flat.  He was admitted for CHF exacerbation and started on IV diuresis. with PMH  PAF, chronic diastolic CHF, CVA, HTN, mitral valve repair, chronic pain, BPH, HTN, obesity    Assessment / Plan / Recommendation  Clinical Impression  Pt exhibited no immediate signs of aspiration during a snack of water, applesauce and banana. He reported he prefers soft food. Called home, talked to his wife's caregiver who confimed pt eats regular, but finely cut up foods. SLP observed adequate mastication. Offered bites followed by sips. Prior to any PO being given pt had some gagging and coughing with expectoration of mucus, lots of tissues laying about that pt had spit into. Overall pt seems to enjoy eating with low risk of aspiration. There may be a risk if food is not soft enough, given a history of cervical esophageal dyspahgia following CVA in 2017. Recommend assist with feeding and positioning with a minced and moist diet, following bites and sips. Will f/u for tolerance. SLP Visit Diagnosis: Dysphagia, unspecified (R13.10)    Aspiration Risk  Mild aspiration risk    Diet Recommendation Dysphagia 2 (Fine chop);Thin liquid    Liquid Administration via: Cup;Straw Medication Administration: Whole meds with puree Supervision: Full supervision/cueing for compensatory strategies Compensations: Slow rate;Small sips/bites;Follow solids with liquid Postural Changes: Seated upright at 90 degrees;Remain upright for at least 30 minutes after po intake    Other  Recommendations Oral Care Recommendations: Oral care BID Caregiver Recommendations: Have oral suction available    Recommendations for  follow up therapy are one component of a multi-disciplinary discharge planning process, led by the attending physician.  Recommendations may be updated based on patient status, additional functional criteria and insurance authorization.  Follow up Recommendations Follow physician's recommendations for discharge plan and follow up therapies      Assistance Recommended at Discharge    Functional Status  Assessment Patient has had a recent decline in their functional status and demonstrates the ability to make significant improvements in function in a reasonable and predictable amount of time.  Frequency and Duration min 2x/week  1 week       Prognosis Prognosis for improved oropharyngeal function: Good      Swallow Study   General HPI: Brian [name] is a 89 year old male who presented with worsening shortness of breath.  He is not on oxygen at home and was found to also be hypoxic on workup in the ER.  CXR and CT chest showed pulmonary edema with moderate bilateral pleural effusions.  BNP elevated, 640.  He denied any chest pain.  Troponin trend flat.  He was admitted for CHF exacerbation and started on IV diuresis. with PMH PAF, chronic diastolic CHF, CVA, HTN, mitral valve repair, chronic pain, BPH, HTN, obesity Type of Study: Bedside Swallow Evaluation Previous Swallow Assessment: MBS x2 2017 after CVA - cervical esopahgeal dysphagia. follow bites with sips, Dys 3/thin Diet Prior to this Study: Regular;Thin liquids (Level 0) Temperature Spikes Noted: No Respiratory Status: Nasal cannula History of Recent Intubation: No Behavior/Cognition: Alert;Cooperative;Pleasant mood Oral Cavity Assessment: Within Functional Limits Oral Care Completed by SLP: No Oral Cavity - Dentition: Dentures, top;Dentures, bottom Vision: Functional for self-feeding Self-Feeding Abilities: Needs assist Patient Positioning: Postural control interferes with function (left leaning) Baseline Vocal Quality: Hoarse Volitional Cough: Strong Volitional Swallow: Able to elicit    Oral/Motor/Sensory Function Overall Oral Motor/Sensory Function: Within functional limits   Ice Chips     Thin Liquid Thin Liquid: Within functional limits Presentation: Straw    Nectar Thick Nectar Thick Liquid: Not tested   Honey Thick Honey Thick Liquid: Not tested   Puree Puree: Within functional limits Presentation: Spoon    Solid     Solid: Within functional limits Presentation:  (banana)      Brian [name], Brian [name] 01/10/2024,11:36 AM

## 2024-01-10 NOTE — Progress Notes (Signed)
 Progress Note    Brian [name]   WUJ:[id number]  DOB: 1932/08/25  DOA: 01/08/2024     2 PCP: Tonna Frederic, MD  Initial CC: [facility] Course: [name] is a 89 year old male with PMH PAF, chronic diastolic CHF, CVA, HTN, mitral valve repair, chronic pain, BPH, HTN, obesity who presented with worsening shortness of breath.  He is not on oxygen at home and was found to also be hypoxic on workup in the ER. CXR and CT chest showed pulmonary edema with moderate bilateral pleural effusions.  BNP elevated, 640. He denied any chest pain.  Troponin trend flat. He was admitted for CHF exacerbation and started on IV diuresis.  Interval History:  No events overnight.  States that his breathing has improved and he overall feels better today.  Still on 2 L oxygen with very mild desaturation at rest. Discussed echo findings with daughter and they do wish for cardiology evaluation during hospitalization.  Assessment and Plan: * Acute on chronic combined systolic and diastolic CHF (congestive heart failure) (HCC) - Presented with worsening shortness of breath/dyspnea -Pleural effusion and pulmonary edema noted on CT chest - BNP elevated 640 - Echo shows significant worsening from 2023.  EF now 20 to 25% (previously 55 to 60%); indeterminate diastolic parameters.  Global hypokinesis -Discussed findings with daughter, they do wish to pursue some treatment and workup with cardiology while hospitalized -He seems to decline ICD workup; but will start some GDMT and monitor for tolerance -Cardiology consulted, appreciate assistance - continue lasix  and toprol  - starting ARB today; cardiology started spironolactone as well - monitor BP  Acute respiratory failure with hypoxia (HCC) - From presumed CHF exacerbation - Not on oxygen at home - Bedbound chronically but having some desaturations still; will arrange for home oxygen at discharge  Acute cystitis - Patient endorsed dysuria on  admission and UA consistent with infection - continue Rocephin  and follow-up urine culture  Elevated troponin - Flat trend with no reports of chest pain on admission - Troponin leak suspected in setting of RVR  Mixed hyperlipidemia - Continue pravastatin   ATRIAL FLUTTER - Continue Eliquis .  Has been on reduced dose due to history of GIB? - will defer to cardiology if dose to be increased given last note mentioning changing to full dose (04/13/23) - Resumed Toprol ; decrease dose   Old records reviewed in assessment of this patient  Antimicrobials: Rocephin  01/08/2024 >> current  DVT prophylaxis:  apixaban  (ELIQUIS ) tablet 2.5 mg Start: 01/09/24 0245 apixaban  (ELIQUIS ) tablet 2.5 mg   Code Status:   Code Status: Full Code  Mobility Assessment (Last 72 Hours)     Mobility Assessment     Row Name 01/10/24 0900 01/10/24 0833 01/09/24 2000 01/09/24 1544 01/09/24 1154   Does patient have an order for bedrest or is patient medically unstable -- Yes- Bedfast (Level 1) - Complete Yes- Bedfast (Level 1) - Complete Yes- Bedfast (Level 1) - Complete --   What is the highest level of mobility based on the progressive mobility assessment? Level 1 (Bedfast) - Unable to balance while sitting on edge of bed Level 1 (Bedfast) - Unable to balance while sitting on edge of bed Level 1 (Bedfast) - Unable to balance while sitting on edge of bed Level 1 (Bedfast) - Unable to balance while sitting on edge of bed Level 1 (Bedfast) - Unable to balance while sitting on edge of bed   Is the above level different from baseline mobility prior to current  illness? -- Yes - Recommend PT order No - Consider discontinuing PT/OT No - Consider discontinuing PT/OT --            Barriers to discharge: None Disposition Plan: Home HH orders placed: N/A Status is: Inpatient  Objective: Blood pressure 113/68, pulse 84, temperature 98.5 F (36.9 C), temperature source Oral, resp. rate 20, height 5\' 6"  (1.676 m),  weight 82.8 kg, SpO2 98%.  Examination:  Physical Exam Constitutional:      General: He is not in acute distress.    Appearance: Normal appearance.  HENT:     Head: Normocephalic and atraumatic.     Mouth/Throat:     Mouth: Mucous membranes are moist.  Eyes:     Extraocular Movements: Extraocular movements intact.  Cardiovascular:     Rate and Rhythm: Normal rate and regular rhythm.  Pulmonary:     Effort: Pulmonary effort is normal. No respiratory distress.     Breath sounds: Rales present. No wheezing.  Abdominal:     General: Bowel sounds are normal. There is no distension.     Palpations: Abdomen is soft.     Tenderness: There is no abdominal tenderness.  Musculoskeletal:        General: Normal range of motion.     Cervical back: Normal range of motion and neck supple.  Skin:    General: Skin is warm and dry.  Neurological:     General: No focal deficit present.     Mental Status: He is alert.  Psychiatric:        Mood and Affect: Mood normal.        Behavior: Behavior normal.      Consultants:  Cardiology  Procedures:    Data Reviewed: Results for orders placed or performed during the hospital encounter of 01/08/24 (from the past 24 hours)  CBC with Differential/Platelet     Status: Abnormal   Collection Time: 01/10/24  2:54 AM  Result Value Ref Range   WBC 12.6 (H) 4.0 - 10.5 K/uL   RBC 4.04 (L) 4.22 - 5.81 MIL/uL   Hemoglobin 9.6 (L) 13.0 - 17.0 g/dL   HCT 16.1 (L) 09.6 - 04.5 %   MCV 79.2 (L) 80.0 - 100.0 fL   MCH 23.8 (L) 26.0 - 34.0 pg   MCHC 30.0 30.0 - 36.0 g/dL   RDW 40.9 (H) 81.1 - 91.4 %   Platelets 226 150 - 400 K/uL   nRBC 0.0 0.0 - 0.2 %   Neutrophils Relative % 83 %   Neutro Abs 10.4 (H) 1.7 - 7.7 K/uL   Lymphocytes Relative 7 %   Lymphs Abs 0.9 0.7 - 4.0 K/uL   Monocytes Relative 9 %   Monocytes Absolute 1.2 (H) 0.1 - 1.0 K/uL   Eosinophils Relative 0 %   Eosinophils Absolute 0.0 0.0 - 0.5 K/uL   Basophils Relative 0 %   Basophils  Absolute 0.0 0.0 - 0.1 K/uL   Immature Granulocytes 1 %   Abs Immature Granulocytes 0.06 0.00 - 0.07 K/uL  Basic metabolic panel with GFR     Status: Abnormal   Collection Time: 01/10/24  2:54 AM  Result Value Ref Range   Sodium 139 135 - 145 mmol/L   Potassium 3.4 (L) 3.5 - 5.1 mmol/L   Chloride 101 98 - 111 mmol/L   CO2 24 22 - 32 mmol/L   Glucose, Bld 126 (H) 70 - 99 mg/dL   BUN 14 8 - 23 mg/dL   Creatinine,  Ser 0.89 0.61 - 1.24 mg/dL   Calcium  9.0 8.9 - 10.3 mg/dL   GFR, Estimated >45 >40 mL/min   Anion gap 14 5 - 15  Magnesium      Status: Abnormal   Collection Time: 01/10/24  2:54 AM  Result Value Ref Range   Magnesium  1.6 (L) 1.7 - 2.4 mg/dL   *Note: Due to a large number of results and/or encounters for the requested time period, some results have not been displayed. A complete set of results can be found in Results Review.    I have reviewed pertinent nursing notes, vitals, labs, and images as necessary. I have ordered labwork to follow up on as indicated.  I have reviewed the last notes from staff over past 24 hours. I have discussed patient's care plan and test results with nursing staff, CM/SW, and other staff as appropriate.  Time spent: Greater than 50% of the 55 minute visit was spent in counseling/coordination of care for the patient as laid out in the A&P.   LOS: 2 days   Faith Homes, MD [facility] 01/10/2024, 4:24 PM

## 2024-01-10 NOTE — Evaluation (Signed)
 Occupational Therapy Evaluation Patient Details Name: Brian [name] MRN: [id number] DOB: August 30, 1932 Today's Date: 01/10/2024   History of Present Illness   89 y.o. male presents to [name] from PCP on 01/08/24 due to worsening SOB, L sided chest pain, generalized weakness, and mild confusion. Chest x-ray showed pulmonary edema with L>R pleural effusions. Admitted with acute on chronic HFpEF. PMHx: chronic pain syndrome, persistent atrial fibrillation on Eliquis , chronic HFpEF, prior CVA, TIA, obesity, hypertension, prior mitral valve repair in 2002     Clinical Impressions Pt's daughter was called in session and reported the pt is basically bed level. They have attempted multiple types of transfers and pt will only complete transfer to [facility] if son "picks them up". She reported they have caregivers every day to assist as needed within the home. At this time required set up to max assist with self feeding as at times does not attempt to reach out for devices and max-total assist to reposition in the bed multiple times. Then for all other ADLS max-total 1-2 assist. Family is agreeable to [facility] services with the return to home.       If plan is discharge home, recommend the following:   Two people to help with walking and/or transfers;Two people to help with bathing/dressing/bathroom;Assistance with cooking/housework;Assistance with feeding;Direct supervision/assist for medications management;Direct supervision/assist for financial management;Assist for transportation;Supervision due to cognitive status     Functional Status Assessment   Patient has had a recent decline in their functional status and demonstrates the ability to make significant improvements in function in a reasonable and predictable amount of time.     Equipment Recommendations   None recommended by OT     Recommendations for Other Services         Precautions/Restrictions   Precautions Precautions:  Fall Restrictions Weight Bearing Restrictions Per Provider Order: No     Mobility Bed Mobility Overal bed mobility: Needs Assistance             General bed mobility comments: max- total assist x2    Transfers                   General transfer comment: deffered as reported being bed level for years      Balance                                           ADL either performed or assessed with clinical judgement   ADL Overall ADL's : Needs assistance/impaired Eating/Feeding: Minimal assistance;Sitting;Maximal assistance   Grooming: Total assistance   Upper Body Bathing: Total assistance   Lower Body Bathing: +2 for physical assistance;Total assistance;+2 for safety/equipment   Upper Body Dressing : Total assistance   Lower Body Dressing: Total assistance;+2 for physical assistance;+2 for safety/equipment   Toilet Transfer: Total assistance;+2 for physical assistance;+2 for safety/equipment   Toileting- Clothing Manipulation and Hygiene: Total assistance;+2 for physical assistance;+2 for safety/equipment               Vision         Perception         Praxis         Pertinent Vitals/Pain Pain Assessment Pain Assessment: Faces Faces Pain Scale: Hurts little more Pain Location: L elbow Pain Descriptors / Indicators: Discomfort     Extremity/Trunk Assessment Upper Extremity Assessment Upper Extremity Assessment: Generalized weakness;LUE deficits/detail;RUE deficits/detail RUE Deficits /  Details: Pt very weak in LUE and only mainly used for self feeding LUE Deficits / Details: noted very limited AROM  due to flexor tone pattern LUE Sensation: decreased light touch;decreased proprioception LUE Coordination: decreased fine motor;decreased gross motor       Cervical / Trunk Assessment Cervical / Trunk Assessment: Kyphotic;Other exceptions Cervical / Trunk Exceptions: increased body habitus   Communication  Communication Communication: Impaired Factors Affecting Communication: Hearing impaired   Cognition Arousal: Alert Behavior During Therapy: WFL for tasks assessed/performed Cognition: Cognition impaired   Orientation impairments: Situation, Time, Place Awareness: Intellectual awareness impaired, Online awareness impaired Memory impairment (select all impairments): Short-term memory Attention impairment (select first level of impairment): Selective attention Executive functioning impairment (select all impairments): Reasoning, Problem solving                   Following commands: Impaired Following commands impaired: Follows one step commands inconsistently, Follows one step commands with increased time     Cueing  General Comments   Cueing Techniques: Verbal cues;Tactile cues      Exercises     Shoulder Instructions      Home Living Family/patient expects to be discharged to:: Private residence Living Arrangements: Children Available Help at Discharge: Family Type of Home: House                       Home Equipment: Wheelchair - manual;Hospital bed          Prior Functioning/Environment Prior Level of Function : Needs assist             Mobility Comments: Spoke to daughter in session and reported that pt is bassically bed bound level. They have attempted different type of transfers but unable to complete unless son "picks up" the pt to transfer ADLs Comments: Pt needs assist with all ADLS    OT Problem List: Decreased strength;Decreased range of motion;Decreased activity tolerance;Impaired balance (sitting and/or standing);Decreased safety awareness;Pain   OT Treatment/Interventions:        OT Goals(Current goals can be found in the care plan section)   Acute Rehab OT Goals Patient Stated Goal: to go home OT Goal Formulation: With patient Time For Goal Achievement: 01/24/24 Potential to Achieve Goals: Fair   OT Frequency:        Co-evaluation              AM-PAC OT "6 Clicks" Daily Activity     Outcome Measure Help from another person eating meals?: A Lot Help from another person taking care of personal grooming?: Total Help from another person toileting, which includes using toliet, bedpan, or urinal?: Total Help from another person bathing (including washing, rinsing, drying)?: Total Help from another person to put on and taking off regular upper body clothing?: Total Help from another person to put on and taking off regular lower body clothing?: Total 6 Click Score: 7   End of Session Nurse Communication: Mobility status  Activity Tolerance: Patient tolerated treatment well Patient left: in bed;with bed alarm set;with call bell/phone within reach  OT Visit Diagnosis: Muscle weakness (generalized) (M62.81)                Time: 0820-0901 OT Time Calculation (min): 41 min Charges:  OT General Charges $OT Visit: 1 Visit OT Evaluation $OT Eval Moderate Complexity: 1 Mod OT Treatments $Self Care/Home Management : 23-37 mins  Brian [name]  Acute Rehab Services  [phone] office number   Stevphen Elders  01/10/2024, 9:19 AM

## 2024-01-10 NOTE — Assessment & Plan Note (Addendum)
-   Patient endorsed dysuria on admission and UA consistent with infection - Urine culture grew Klebsiella, sensitivities reviewed - Completed 4 days Rocephin  during hospitalization to complete course

## 2024-01-11 DIAGNOSIS — Z8679 Personal history of other diseases of the circulatory system: Secondary | ICD-10-CM

## 2024-01-11 DIAGNOSIS — I5043 Acute on chronic combined systolic (congestive) and diastolic (congestive) heart failure: Secondary | ICD-10-CM | POA: Diagnosis not present

## 2024-01-11 DIAGNOSIS — R7989 Other specified abnormal findings of blood chemistry: Secondary | ICD-10-CM

## 2024-01-11 DIAGNOSIS — J9601 Acute respiratory failure with hypoxia: Secondary | ICD-10-CM | POA: Diagnosis not present

## 2024-01-11 DIAGNOSIS — N3000 Acute cystitis without hematuria: Secondary | ICD-10-CM | POA: Diagnosis not present

## 2024-01-11 LAB — CBC WITH DIFFERENTIAL/PLATELET
Abs Immature Granulocytes: 0.03 10*3/uL (ref 0.00–0.07)
Basophils Absolute: 0 10*3/uL (ref 0.0–0.1)
Basophils Relative: 0 %
Eosinophils Absolute: 0.1 10*3/uL (ref 0.0–0.5)
Eosinophils Relative: 1 %
HCT: 30.1 % — ABNORMAL LOW (ref 39.0–52.0)
Hemoglobin: 8.9 g/dL — ABNORMAL LOW (ref 13.0–17.0)
Immature Granulocytes: 0 %
Lymphocytes Relative: 16 %
Lymphs Abs: 1.5 10*3/uL (ref 0.7–4.0)
MCH: 23.5 pg — ABNORMAL LOW (ref 26.0–34.0)
MCHC: 29.6 g/dL — ABNORMAL LOW (ref 30.0–36.0)
MCV: 79.4 fL — ABNORMAL LOW (ref 80.0–100.0)
Monocytes Absolute: 1.3 10*3/uL — ABNORMAL HIGH (ref 0.1–1.0)
Monocytes Relative: 14 %
Neutro Abs: 6.2 10*3/uL (ref 1.7–7.7)
Neutrophils Relative %: 69 %
Platelets: 184 10*3/uL (ref 150–400)
RBC: 3.79 MIL/uL — ABNORMAL LOW (ref 4.22–5.81)
RDW: 17.7 % — ABNORMAL HIGH (ref 11.5–15.5)
WBC: 9.1 10*3/uL (ref 4.0–10.5)
nRBC: 0 % (ref 0.0–0.2)

## 2024-01-11 LAB — BASIC METABOLIC PANEL WITH GFR
Anion gap: 7 (ref 5–15)
BUN: 10 mg/dL (ref 8–23)
CO2: 27 mmol/L (ref 22–32)
Calcium: 8.6 mg/dL — ABNORMAL LOW (ref 8.9–10.3)
Chloride: 104 mmol/L (ref 98–111)
Creatinine, Ser: 0.52 mg/dL — ABNORMAL LOW (ref 0.61–1.24)
GFR, Estimated: >60 mL/min (ref >60)
Glucose, Bld: 121 mg/dL — ABNORMAL HIGH (ref 70–99)
Potassium: 3.4 mmol/L — ABNORMAL LOW (ref 3.5–5.1)
Sodium: 138 mmol/L (ref 135–145)

## 2024-01-11 LAB — URINE CULTURE: Culture: >=100000 — AB

## 2024-01-11 LAB — MAGNESIUM: Magnesium: 1.9 mg/dL (ref 1.7–2.4)

## 2024-01-11 MED ORDER — POTASSIUM CHLORIDE 20 MEQ PO PACK
40.0000 meq | PACK | Freq: Once | ORAL | Status: AC
  Administered 2024-01-11: 40 meq via ORAL
  Filled 2024-01-11: qty 2

## 2024-01-11 MED ORDER — METOPROLOL SUCCINATE ER 25 MG PO TB24: 12.5000 mg | ORAL_TABLET | Freq: Every day | ORAL | 3 refills | Status: AC

## 2024-01-11 MED ORDER — APIXABAN 5 MG PO TABS: 5.0000 mg | ORAL_TABLET | Freq: Two times a day (BID) | ORAL | 3 refills | Status: AC

## 2024-01-11 MED ORDER — LOSARTAN POTASSIUM 25 MG PO TABS: 12.5000 mg | ORAL_TABLET | Freq: Every day | ORAL | 3 refills | Status: AC

## 2024-01-11 MED ORDER — SPIRONOLACTONE 25 MG PO TABS: 12.5000 mg | ORAL_TABLET | Freq: Every day | ORAL | 3 refills | Status: AC

## 2024-01-11 MED ORDER — APIXABAN 5 MG PO TABS: 5.0000 mg | ORAL_TABLET | Freq: Two times a day (BID) | ORAL | Status: DC

## 2024-01-11 NOTE — Progress Notes (Signed)
 Heart Failure Navigator Progress Note  Assessed for Heart & Vascular TOC clinic readiness.  Patient has scheduled follow up with [facility] on 6/9. Will not schedule additional HF TOC appt at this time.   Navigator available for reassessment of patient.   Jerilyn Monte, PharmD, BCPS Heart Failure Stewardship Pharmacist Phone [phone]

## 2024-01-11 NOTE — TOC Transition Note (Addendum)
 Transition of Care [facility]) - Discharge Note   Patient Details  Name: Brian [name] MRN: [id number] Date of Birth: 20-Feb-1933  Transition of Care [facility]) CM/SW Contact:  Jennett Model, RN Phone Number: 01/11/2024, 10:25 AM   Clinical Narrative:    For dc today, [employer] has delivered oxygen tank to room and will set up concentrator at home.  Son will transport patient home.  NCM notified [place] of dc today.  Son will be her at 12:30 or 1 pm to transport him home with w/chair [name].         Patient Goals and CMS Choice            Discharge Placement                       Discharge Plan and Services Additional resources added to the After Visit Summary for                                       Social Drivers of Health (SDOH) Interventions SDOH Screenings   Food Insecurity: No Food Insecurity (01/09/2024)  Housing: Low Risk  (01/09/2024)  Transportation Needs: No Transportation Needs (01/09/2024)  Utilities: Not At Risk (01/09/2024)  Depression (PHQ2-9): Low Risk  (11/27/2023)  Financial Resource Strain: Low Risk  (09/16/2021)  Social Connections: Patient Declined (01/09/2024)  Tobacco Use: Medium Risk (01/08/2024)     Readmission Risk Interventions    01/10/2024    2:49 PM  Readmission Risk Prevention Plan  Transportation Screening Complete  PCP or Specialist Appt within 5-7 Days Complete  Home Care Screening Complete  Medication Review (RN CM) Complete

## 2024-01-11 NOTE — Progress Notes (Signed)
   Patient Name: Brian [name] Date of Encounter: 01/11/2024 [facility] Cardiologist: Maudine Sos, MD   Interval Summary  .    Resting comfortably  Vital Signs .    Vitals:   01/10/24 2027 01/11/24 0041 01/11/24 0408 01/11/24 0747  BP: 117/76 131/73 119/67 (!) 127/52  Pulse: 82 64 99 94  Resp: 17 18 17 19   Temp: 98.5 F (36.9 C) 99.8 F (37.7 C) 99.1 F (37.3 C) 97.7 F (36.5 C)  TempSrc:  Oral Oral Oral  SpO2: 100% 97% 96% 98%  Weight:   84.5 kg   Height:        Intake/Output Summary (Last 24 hours) at 01/11/2024 0845 Last data filed at 01/11/2024 0041 Gross per 24 hour  Intake --  Output 1100 ml  Net -1100 ml      01/11/2024    4:08 AM 01/10/2024   12:08 AM 01/09/2024   11:34 AM  Last 3 Weights  Weight (lbs) 186 lb 4.6 oz 182 lb 8.7 oz 191 lb 12.8 oz  Weight (kg) 84.5 kg 82.8 kg 87 kg      Telemetry/ECG    afib - Personally Reviewed  Physical Exam .   GEN: No acute distress.  Frail Neck: No JVD Cardiac: Irreg, no murmurs, rubs, or gallops.  Respiratory: Clear to auscultation bilaterally. GI: Soft, nontender, non-distended  MS: No edema  Assessment & Plan .     89 year old with acute on chronic combined systolic and diastolic heart failure acute respiratory failure cystitis elevated troponin hyperlipidemia atrial flutter.  Mitral valve repair  Acute on chronic systolic heart failure - Main complaint was worsening shortness of breath - BNP 640 on admit - Prior echocardiogram 2023 was normal EF 60%, now 20 to 25%. - No ICD. - ARB started, spironolactone started -Continue with Lasix  40 mg p.o.   Elevated troponin - Not ACS.  Myocardial injury in the setting of heart failure.  Does portend a worsened prognosis however.  Persistent atrial fibrillation - Current EKG shows atrial fibrillation heart rate 103 bpm with left bundle branch block. -Only meets 1 criteria for reduced dose of Eliquis  age greater than 80.  Previously in 2017 his  weight was less than 60 kg and in the setting of stroke had his Eliquis  decreased to 2.5 mg twice a day.  Remotely in 2002 required a transfusion for gastric ulcer. - I would not be opposed given his current weight and creatinine less than 1.5 increasing his Eliquis  to 5 mg twice a day.   Mitral valve repair - Stable.  We will go ahead and sign off.  Please let us  know if further assistance.  For questions or updates, please contact [facility] Please consult [email or web address] for contact info under        Signed, Dorothye Gathers, MD

## 2024-01-11 NOTE — Assessment & Plan Note (Signed)
-   Continue Eliquis .  Has been on reduced dose due to remote history of GIB/ulcer.  Also had history of weight loss warranting dosage decrease - Now has no further indication for decreased dosage; also reviewed by cardiology - Increasing Eliquis  dosing back to 5 mg twice daily.  Discussed with daughter as well who understands plan - Continue Toprol

## 2024-01-11 NOTE — Progress Notes (Signed)
 Discharge instructions provided to patient and son. All medications, follow up instructions, and discharge instructions provided. IV out. Monitor off CCMD notified. Discharging home.

## 2024-01-11 NOTE — Care Management Important Message (Signed)
 Important Message  Patient Details  Name: Brian [name] MRN: [id number] Date of Birth: 14-Feb-1933   Important Message Given:  Yes - Medicare IM     Wynonia Hedges 01/11/2024, 3:53 PM

## 2024-01-11 NOTE — Discharge Summary (Signed)
 Physician Discharge Summary   Brian [name] QMV:[id number] DOB: Aug 12, 1933 DOA: 01/08/2024  PCP: Tonna Frederic, MD  Admit date: 01/08/2024 Discharge date: 01/11/2024  Admitted From: Home Disposition:  Home Discharging physician: Faith Homes, MD Barriers to discharge: none  Recommendations at discharge: Follow up with cardiology Repeat BMP given addition of losartan  and spironolactone Ensure BP tolerance to new regimen  Continue code status discussions given guarded prognosis; patient/family not ready for change during hospitalization; does have palliative care following at home also   Discharge Condition: stable CODE STATUS: Full  Diet recommendation:  Diet Orders (From admission, onward)     Start     Ordered   01/10/24 1120  DIET DYS 2 Room service appropriate? No; Fluid consistency: Thin  Diet effective now       Question Answer Comment  Room service appropriate? No   Fluid consistency: Thin      01/10/24 1119            Hospital Course: Brian [name] is a 89 year old male with PMH PAF, chronic diastolic CHF, CVA, HTN, mitral valve repair, chronic pain, BPH, HTN, obesity who presented with worsening shortness of breath.  He is not on oxygen at home and was found to also be hypoxic on workup in the ER. CXR and CT chest showed pulmonary edema with moderate bilateral pleural effusions.  BNP elevated, 640. He denied any chest pain.  Troponin trend flat. He was admitted for CHF exacerbation and started on IV diuresis.  Assessment and Plan: * Acute on chronic combined systolic and diastolic CHF (congestive heart failure) (HCC) - Presented with worsening shortness of breath/dyspnea -Pleural effusion and pulmonary edema noted on CT chest - BNP elevated 640 - Echo shows significant worsening from 2023.  EF now 20 to 25% (previously 55 to 60%); indeterminate diastolic parameters.  Global hypokinesis -Discussed findings with daughter, they do wish to pursue some  treatment and workup with cardiology while hospitalized -He seems to decline ICD workup; but will start some GDMT and monitor for tolerance -Cardiology consulted, appreciate assistance - continue lasix  and toprol  (dose decreased to allow blood pressure room for GDMT) - Started on losartan  and spironolactone during hospitalization - Clinically stable and improved with plans for outpatient follow-up  Acute respiratory failure with hypoxia (HCC) - From presumed CHF exacerbation - Not on oxygen at home - Bedbound chronically but having some desaturations still; will arrange for home oxygen at discharge  Acute cystitis - Patient endorsed dysuria on admission and UA consistent with infection - Urine culture grew Klebsiella, sensitivities reviewed - Completed 4 days Rocephin  during hospitalization to complete course  History of peristent atrial fibrillation - Continue Eliquis .  Has been on reduced dose due to remote history of GIB/ulcer.  Also had history of weight loss warranting dosage decrease - Now has no further indication for decreased dosage; also reviewed by cardiology - Increasing Eliquis  dosing back to 5 mg twice daily.  Discussed with daughter as well who understands plan - Continue Toprol   Elevated troponin - Flat trend with no reports of chest pain on admission - Troponin leak suspected in setting of RVR  Mixed hyperlipidemia - Continue pravastatin    The patient's acute and chronic medical conditions were treated accordingly. On day of discharge, patient was felt deemed stable for discharge. Patient/family member advised to call PCP or come back to ER if needed.   Principal Diagnosis: Acute on chronic combined systolic and diastolic CHF (congestive heart failure) (HCC)  Discharge Diagnoses: Active  Hospital Problems   Diagnosis Date Noted   Acute on chronic combined systolic and diastolic CHF (congestive heart failure) (HCC) 01/08/2024    Priority: 1.   Acute respiratory  failure with hypoxia (HCC)     Priority: 2.   Acute cystitis 01/10/2024    Priority: 3.   History of peristent atrial fibrillation 02/13/2007    Priority: 3.   Elevated troponin 01/09/2024   Mixed hyperlipidemia 09/24/2018    Resolved Hospital Problems  No resolved problems to display.     Discharge Instructions     Increase activity slowly   Complete by: As directed       Allergies as of 01/11/2024       Reactions   Novocain [procaine Hcl]    Irregular heart beat    Other    Patient had problem with epidural with his right hip surgery. Went to up extremity instead of lower extremity    Penicillins Itching   Has patient had a PCN reaction causing immediate rash, facial/tongue/throat swelling, SOB or lightheadedness with hypotension: Unk Has patient had a PCN reaction causing severe rash involving mucus membranes or skin necrosis: Unk Has patient had a PCN reaction that required hospitalization: Unk Has patient had a PCN reaction occurring within the last 10 years: No If all of the above answers are "NO", then may proceed with Cephalosporin use. No reaction noted        Medication List     TAKE these medications    ALPRAZolam  0.25 MG tablet Commonly known as: XANAX  Take 0.25 mg by mouth 2 (two) times daily as needed for anxiety.   apixaban  5 MG Tabs tablet Commonly known as: ELIQUIS  Take 1 tablet (5 mg total) by mouth 2 (two) times daily. What changed:  medication strength See the new instructions.   cetirizine  10 MG tablet Commonly known as: ZYRTEC  TAKE 1 TABLET(10 MG) BY MOUTH AT BEDTIME What changed: See the new instructions.   clotrimazole  1 % cream Commonly known as: LOTRIMIN  Apply 1 Application topically 2 (two) times daily as needed (for rash).   finasteride  5 MG tablet Commonly known as: PROSCAR  TAKE 1 TABLET(5 MG) BY MOUTH DAILY   fluticasone 50 MCG/ACT nasal spray Commonly known as: FLONASE Place 1 spray into both nostrils daily as needed  for allergies.   furosemide  40 MG tablet Commonly known as: LASIX  Take 1 tablet (40 mg total) by mouth daily.   gentamicin ointment 0.1 % Commonly known as: GARAMYCIN Apply topically as needed (pressure sore).   hydrocortisone  ointment 0.5 % Apply 1 Application topically daily as needed for itching.   losartan  25 MG tablet Commonly known as: COZAAR  Take 0.5 tablets (12.5 mg total) by mouth daily. Start taking on: Jan 12, 2024   melatonin 5 MG Tabs Take 5 mg by mouth at bedtime as needed (sleep).   metoprolol  succinate 25 MG 24 hr tablet Commonly known as: TOPROL -XL Take 0.5 tablets (12.5 mg total) by mouth daily. Start taking on: Jan 12, 2024 What changed: how much to take   multivitamin tablet Take 1 tablet by mouth 2 (two) times daily.   MUPIROCIN EX Apply 1 Application topically daily as needed (wound care).   oxyCODONE -acetaminophen  7.5-325 MG tablet Commonly known as: PERCOCET Take 1 tablet by mouth 2 (two) times daily as needed for severe pain (pain score 7-10). What changed:  when to take this Another medication with the same name was removed. Continue taking this medication, and follow the directions you see  here.   pantoprazole  40 MG tablet Commonly known as: PROTONIX  Take 1 tablet (40 mg total) by mouth daily.   polyethylene glycol powder 17 GM/SCOOP powder Commonly known as: GLYCOLAX /MIRALAX  Mix one scoop with water and take daily until stools loosen. May repeat as needed. What changed:  how much to take how to take this when to take this reasons to take this additional instructions   pravastatin  40 MG tablet Commonly known as: PRAVACHOL  TAKE 1 TABLET(40 MG) BY MOUTH DAILY   pregabalin  25 MG capsule Commonly known as: Lyrica  Take 2 capsules (50 mg total) by mouth at bedtime.   QUEtiapine  50 MG tablet Commonly known as: SEROQUEL  TAKE 1 TABLET(50 MG) BY MOUTH AT BEDTIME   sertraline  50 MG tablet Commonly known as: ZOLOFT  TAKE 1 TABLET(50  MG) BY MOUTH EVERY MORNING   spironolactone 25 MG tablet Commonly known as: ALDACTONE Take 0.5 tablets (12.5 mg total) by mouth daily. Start taking on: Jan 12, 2024   tamsulosin  0.4 MG Caps capsule Commonly known as: FLOMAX  TAKE 1 CAPSULE(0.4 MG) BY MOUTH DAILY   trimethoprim 100 MG tablet Commonly known as: TRIMPEX Take 100 mg by mouth at bedtime.               Durable Medical Equipment  (From admission, onward)           Start     Ordered   01/10/24 1549  For home use only DME oxygen  Once       Question Answer Comment  Length of Need Lifetime   Mode or (Route) Nasal cannula   Liters per Minute 2   Frequency Continuous (stationary and portable oxygen unit needed)   Oxygen conserving device Yes   Oxygen delivery system Gas      01/10/24 1548            Follow-up Information     Tonna Frederic, MD Follow up.   Specialty: Family Medicine Why: GO: JUNE 2 AT [facility] Contact information: [address] [place] [address] [phone]         [facility] Follow up.   Specialty: Home Health Services Why: Agency will call you to set up apt times Contact information: [address] [place] [address] [phone]         [facility] Follow up.   Why: Home oxygen Contact information: [address] [place] [address] [phone]                Allergies  Allergen Reactions   Novocain [Procaine Hcl]     Irregular heart beat      Other     Patient had problem with epidural with his right hip surgery. Went to up extremity instead of lower extremity    Penicillins Itching    Has patient had a PCN reaction causing immediate rash, facial/tongue/throat swelling, SOB or lightheadedness with hypotension: Unk Has patient had a PCN reaction causing severe rash involving mucus membranes or skin necrosis: Unk Has patient had a PCN reaction that required hospitalization: Unk Has patient had  a PCN reaction occurring within the last 10 years: No If all of the above answers are "NO", then may proceed with Cephalosporin use.  No reaction noted    Consultations: Cardiology  Procedures:   Discharge Exam: BP 107/68 (BP Location: Right Arm)   Pulse 95   Temp 97.7 F (36.5 C) (Oral)   Resp 20   Ht 5\' 6"  (1.676 m)  Wt 84.5 kg   SpO2 98%   BMI 30.07 kg/m  Physical Exam Constitutional:      General: He is not in acute distress.    Appearance: Normal appearance.  HENT:     Head: Normocephalic and atraumatic.     Mouth/Throat:     Mouth: Mucous membranes are moist.  Eyes:     Extraocular Movements: Extraocular movements intact.  Cardiovascular:     Rate and Rhythm: Normal rate and regular rhythm.  Pulmonary:     Effort: Pulmonary effort is normal. No respiratory distress.     Breath sounds: Rales (minimal and improved) present. No wheezing.  Abdominal:     General: Bowel sounds are normal. There is no distension.     Palpations: Abdomen is soft.     Tenderness: There is no abdominal tenderness.  Musculoskeletal:        General: Normal range of motion.     Cervical back: Normal range of motion and neck supple.     Right lower leg: Edema (1+) present.     Left lower leg: Edema (1+) present.  Skin:    General: Skin is warm and dry.  Neurological:     General: No focal deficit present.     Mental Status: He is alert.  Psychiatric:        Mood and Affect: Mood normal.        Behavior: Behavior normal.      The results of significant diagnostics from this hospitalization (including imaging, microbiology, ancillary and laboratory) are listed below for reference.   Microbiology: Recent Results (from the past 240 hours)  Urine Culture     Status: Abnormal   Collection Time: 01/08/24  3:50 PM   Specimen: Urine, Random  Result Value Ref Range Status   Specimen Description URINE, RANDOM  Final   Special Requests   Final    NONE Reflexed from [id number] Performed  at [facility], [address], [place], [place] [address]    Culture >=100,000 COLONIES/mL KLEBSIELLA PNEUMONIAE (A)  Final   Report Status 01/11/2024 FINAL  Final   Organism ID, Bacteria KLEBSIELLA PNEUMONIAE (A)  Final      Susceptibility   Klebsiella pneumoniae - MIC*    AMPICILLIN >=32 RESISTANT Resistant     CEFAZOLIN <=4 SENSITIVE Sensitive     CEFEPIME <=0.12 SENSITIVE Sensitive     CEFTRIAXONE  <=0.25 SENSITIVE Sensitive     CIPROFLOXACIN <=0.25 SENSITIVE Sensitive     GENTAMICIN <=1 SENSITIVE Sensitive     IMIPENEM <=0.25 SENSITIVE Sensitive     NITROFURANTOIN 64 INTERMEDIATE Intermediate     TRIMETH/SULFA >=320 RESISTANT Resistant     AMPICILLIN/SULBACTAM >=32 RESISTANT Resistant     PIP/TAZO 8 SENSITIVE Sensitive ug/mL    * >=100,000 COLONIES/mL KLEBSIELLA PNEUMONIAE     Labs: BNP (last 3 results) Recent Labs    04/11/23 1220 04/24/23 1446 01/08/24 1536  BNP 653.4* 404.0* 640.6*   Basic Metabolic Panel: Recent Labs  Lab 01/08/24 1452 01/09/24 0443 01/10/24 0254 01/11/24 0312  NA 138 138 139 138  K 4.3 4.2 3.4* 3.4*  CL 103 103 101 104  CO2 25 20* 24 27  GLUCOSE 107* 127* 126* 121*  BUN 13 15 14 10   CREATININE 0.94 0.87 0.89 0.52*  CALCIUM  8.9 9.2 9.0 8.6*  MG 1.8 1.8 1.6* 1.9  PHOS  --  3.5  --   --    Liver Function Tests: No results for input(s): "AST", "ALT", "ALKPHOS", "  BILITOT", "PROT", "ALBUMIN" in the last 168 hours. No results for input(s): "LIPASE", "AMYLASE" in the last 168 hours. No results for input(s): "AMMONIA" in the last 168 hours. CBC: Recent Labs  Lab 01/08/24 1452 01/09/24 0443 01/10/24 0254 01/11/24 0312  WBC 9.2 11.2* 12.6* 9.1  NEUTROABS  --   --  10.4* 6.2  HGB 9.7* 11.0* 9.6* 8.9*  HCT 32.9* 37.2* 32.0* 30.1*  MCV 80.6 79.7* 79.2* 79.4*  PLT 209 250 226 184   Cardiac Enzymes: No results for input(s): "CKTOTAL", "CKMB", "CKMBINDEX", "TROPONINI" in the last 168 hours. BNP: Invalid input(s):  "POCBNP" CBG: Recent Labs  Lab 01/10/24 2152  GLUCAP 118*   D-Dimer No results for input(s): "DDIMER" in the last 72 hours. Hgb A1c No results for input(s): "HGBA1C" in the last 72 hours. Lipid Profile No results for input(s): "CHOL", "HDL", "LDLCALC", "TRIG", "CHOLHDL", "LDLDIRECT" in the last 72 hours. Thyroid  function studies No results for input(s): "TSH", "T4TOTAL", "T3FREE", "THYROIDAB" in the last 72 hours.  Invalid input(s): "FREET3" Anemia work up No results for input(s): "VITAMINB12", "FOLATE", "FERRITIN", "TIBC", "IRON", "RETICCTPCT" in the last 72 hours. Urinalysis    Component Value Date/Time   COLORURINE YELLOW 01/08/2024 1550   APPEARANCEUR HAZY (A) 01/08/2024 1550   LABSPEC 1.016 01/08/2024 1550   PHURINE 5.0 01/08/2024 1550   GLUCOSEU NEGATIVE 01/08/2024 1550   GLUCOSEU NEGATIVE 11/13/2023 1415   HGBUR MODERATE (A) 01/08/2024 1550   BILIRUBINUR NEGATIVE 01/08/2024 1550   BILIRUBINUR neg 07/21/2013 1211   KETONESUR 20 (A) 01/08/2024 1550   PROTEINUR 30 (A) 01/08/2024 1550   UROBILINOGEN 0.2 11/13/2023 1415   NITRITE POSITIVE (A) 01/08/2024 1550   LEUKOCYTESUR LARGE (A) 01/08/2024 1550   Sepsis Labs Recent Labs  Lab 01/08/24 1452 01/09/24 0443 01/10/24 0254 01/11/24 0312  WBC 9.2 11.2* 12.6* 9.1   Microbiology Recent Results (from the past 240 hours)  Urine Culture     Status: Abnormal   Collection Time: 01/08/24  3:50 PM   Specimen: Urine, Random  Result Value Ref Range Status   Specimen Description URINE, RANDOM  Final   Special Requests   Final    NONE Reflexed from [id number] Performed at [facility], [address] [place], [place] [address]    Culture >=100,000 COLONIES/mL KLEBSIELLA PNEUMONIAE (A)  Final   Report Status 01/11/2024 FINAL  Final   Organism ID, Bacteria KLEBSIELLA PNEUMONIAE (A)  Final      Susceptibility   Klebsiella pneumoniae - MIC*    AMPICILLIN >=32 RESISTANT Resistant     CEFAZOLIN <=4 SENSITIVE Sensitive      CEFEPIME <=0.12 SENSITIVE Sensitive     CEFTRIAXONE  <=0.25 SENSITIVE Sensitive     CIPROFLOXACIN <=0.25 SENSITIVE Sensitive     GENTAMICIN <=1 SENSITIVE Sensitive     IMIPENEM <=0.25 SENSITIVE Sensitive     NITROFURANTOIN 64 INTERMEDIATE Intermediate     TRIMETH/SULFA >=320 RESISTANT Resistant     AMPICILLIN/SULBACTAM >=32 RESISTANT Resistant     PIP/TAZO 8 SENSITIVE Sensitive ug/mL    * >=100,000 COLONIES/mL KLEBSIELLA PNEUMONIAE    Procedures/Studies: ECHOCARDIOGRAM COMPLETE Result Date: 01/09/2024    ECHOCARDIOGRAM REPORT   Patient Name:   Brian [name] Date of Exam: 01/09/2024 Medical Rec #:  [id number]      Height:       66.0 in Accession #:    [id number]     Weight:       191.8 lb Date of Birth:  06-23-1933      BSA:  1.965 m Patient Age:    89 years       BP:           122/86 mmHg Patient Gender: M              HR:           88 bpm. Exam Location:  Inpatient Procedure: 2D Echo, Cardiac Doppler, Color Doppler and Intracardiac            Opacification Agent (Both Spectral and Color Flow Doppler were            utilized during procedure). Indications:    CHF  History:        Patient has prior history of Echocardiogram examinations, most                 recent 03/15/2022. Risk Factors:Hypertension.  Sonographer:    Janette Medley Referring Phys: CAROLE N HALL IMPRESSIONS  1. Left ventricular ejection fraction, by estimation, is 20 to 25%. The left ventricle has severely decreased function. The left ventricle demonstrates global hypokinesis. The left ventricular internal cavity size was mildly dilated. Left ventricular diastolic parameters are indeterminate.  2. Right ventricular systolic function is mildly reduced. The right ventricular size is normal. There is normal pulmonary artery systolic pressure.  3. Left atrial size was mildly dilated.  4. Right atrial size was severely dilated.  5. Moderate pleural effusion in the left lateral region.  6. The mitral valve is normal in structure.  Trivial mitral valve regurgitation. No evidence of mitral stenosis.  7. The aortic valve is tricuspid. Aortic valve regurgitation is mild. No aortic stenosis is present.  8. The inferior vena cava is normal in size with greater than 50% respiratory variability, suggesting right atrial pressure of 3 mmHg. FINDINGS  Left Ventricle: Left ventricular ejection fraction, by estimation, is 20 to 25%. The left ventricle has severely decreased function. The left ventricle demonstrates global hypokinesis. The left ventricular internal cavity size was mildly dilated. There is no left ventricular hypertrophy. Left ventricular diastolic parameters are indeterminate. Right Ventricle: The right ventricular size is normal. No increase in right ventricular wall thickness. Right ventricular systolic function is mildly reduced. There is normal pulmonary artery systolic pressure. The tricuspid regurgitant velocity is 2.31 m/s, and with an assumed right atrial pressure of 3 mmHg, the estimated right ventricular systolic pressure is 24.3 mmHg. Left Atrium: Left atrial size was mildly dilated. Right Atrium: Right atrial size was severely dilated. Pericardium: There is no evidence of pericardial effusion. Mitral Valve: The mitral valve is normal in structure. Trivial mitral valve regurgitation. No evidence of mitral valve stenosis. Tricuspid Valve: The tricuspid valve is normal in structure. Tricuspid valve regurgitation is trivial. No evidence of tricuspid stenosis. Aortic Valve: The aortic valve is tricuspid. Aortic valve regurgitation is mild. No aortic stenosis is present. Aortic valve mean gradient measures 4.0 mmHg. Aortic valve peak gradient measures 7.6 mmHg. Aortic valve area, by VTI measures 2.99 cm. Pulmonic Valve: The pulmonic valve was normal in structure. Pulmonic valve regurgitation is trivial. No evidence of pulmonic stenosis. Aorta: The aortic root is normal in size and structure. Venous: The inferior vena cava is normal  in size with greater than 50% respiratory variability, suggesting right atrial pressure of 3 mmHg. IAS/Shunts: No atrial level shunt detected by color flow Doppler. Additional Comments: There is a moderate pleural effusion in the left lateral region.  LEFT VENTRICLE PLAX 2D LVIDd:         5.90  cm   Diastology LVIDs:         5.30 cm   LV e' lateral: 10.10 cm/s LV PW:         1.00 cm LV IVS:        1.10 cm LVOT diam:     2.30 cm LV SV:         64 LV SV Index:   33 LVOT Area:     4.15 cm  RIGHT VENTRICLE             IVC RV S prime:     11.60 cm/s  IVC diam: 2.40 cm TAPSE (M-mode): 1.4 cm LEFT ATRIUM             Index        RIGHT ATRIUM           Index LA diam:        3.90 cm 1.99 cm/m   RA Area:     28.10 cm LA Vol (A2C):   89.8 ml 45.71 ml/m  RA Volume:   101.00 ml 51.41 ml/m LA Vol (A4C):   59.6 ml 30.34 ml/m LA Biplane Vol: 74.7 ml 38.02 ml/m  AORTIC VALVE AV Area (Vmax):    3.16 cm AV Area (Vmean):   3.05 cm AV Area (VTI):     2.99 cm AV Vmax:           138.00 cm/s AV Vmean:          95.500 cm/s AV VTI:            0.214 m AV Peak Grad:      7.6 mmHg AV Mean Grad:      4.0 mmHg LVOT Vmax:         105.00 cm/s LVOT Vmean:        70.200 cm/s LVOT VTI:          0.154 m LVOT/AV VTI ratio: 0.72  AORTA Ao Root diam: 3.40 cm Ao Asc diam:  3.20 cm TRICUSPID VALVE TR Peak grad:   21.3 mmHg TR Vmax:        231.00 cm/s  SHUNTS Systemic VTI:  0.15 m Systemic Diam: 2.30 cm Maudine Sos MD Electronically signed by Maudine Sos MD Signature Date/Time: 01/09/2024/1:47:23 PM    Final    CT Chest W Contrast Result Date: 01/08/2024 CLINICAL DATA:  Chest pain and shortness of breath EXAM: CT CHEST WITH CONTRAST TECHNIQUE: Multidetector CT imaging of the chest was performed during intravenous contrast administration. RADIATION DOSE REDUCTION: This exam was performed according to the departmental dose-optimization program which includes automated exposure control, adjustment of the mA and/or kV according to patient  size and/or use of iterative reconstruction technique. CONTRAST:  58mL OMNIPAQUE  IOHEXOL  350 MG/ML SOLN COMPARISON:  Earlier same day chest radiograph, CTA chest dated 04/11/2023 FINDINGS: Cardiovascular: Multichamber cardiomegaly. Mitral annular prosthesis. No significant pericardial fluid/thickening. Great vessels are normal in course and caliber. No central pulmonary emboli. Coronary artery calcifications and aortic atherosclerosis. Mediastinum/Nodes: Imaged thyroid  gland without nodules meeting criteria for imaging follow-up by size. Normal esophagus. Multi station lymphadenopathy, for example: 12 mm right supraclavicular lymph node (3:18), previously 10 mm, 11 mm right axillary (3:31), previously 6 mm, and 10 mm pretracheal (3:52), unchanged. Lungs/Pleura: The central airways are patent. Diffuse interlobular septal thickening with scattered areas of a geometric ground-glass opacities. Thickening along the left major fissure. Bilateral lower lobe relaxation atelectasis. No pneumothorax. Increased moderate bilateral pleural effusions. Upper abdomen: Unchanged multifocal hepatic hypodensities  measuring up to 1.8 cm in segment 2 (3: 135), likely cysts. Musculoskeletal: Median sternotomy wires are nondisplaced. 2.5 cm lipoma within the right posterior deltoid (3:17). IMPRESSION: 1. Pulmonary edema with increased moderate bilateral pleural effusions. 2. Multichamber cardiomegaly. 3. Multi station lymphadenopathy, likely reactive. 4. Aortic Atherosclerosis (ICD10-I70.0). Coronary artery calcifications. Assessment for potential risk factor modification, dietary therapy or pharmacologic therapy may be warranted, if clinically indicated. Electronically Signed   By: Limin  Xu M.D.   On: 01/08/2024 19:35   DG Chest Portable 1 View Result Date: 01/08/2024 CLINICAL DATA:  Chest pain short of breath EXAM: PORTABLE CHEST 1 VIEW COMPARISON:  04/11/2023 FINDINGS: Sternotomy and valve prosthesis. Cardiomegaly with vascular  congestion and small right and moderate left pleural effusions. Left greater than right bibasilar airspace disease. No pneumothorax IMPRESSION: Cardiomegaly with vascular congestion and small right and moderate left pleural effusions. Left greater than right bibasilar airspace disease, atelectasis versus pneumonia. Electronically Signed   By: Esmeralda Hedge M.D.   On: 01/08/2024 16:38     Time coordinating discharge: Over 30 minutes    Faith Homes, MD  [facility] 01/11/2024, 1:00 PM

## 2024-01-11 NOTE — Care Management Important Message (Signed)
 Important Message  Patient Details  Name: Brian [name] MRN: [id number] Date of Birth: 1932-08-25   Important Message Given:        Wynonia Hedges 01/11/2024, 3:54 PM

## 2024-01-15 NOTE — Progress Notes (Signed)
 Cardiology Office Note:  .   Date:  01/28/2024  ID:  Brian [name], DOB [birth year], MRN [id number] PCP: Tonna Frederic, MD  [facility] Providers Cardiologist:  Maudine Sos, MD    History of Present Illness: .   Brian [name] is a 89 y.o. male  with a hx of persistent A. Fib, GI bleed, prior CVA, hypertension, history of MVR at [facility] (2002), chronic HFpEF, multiple active pressure wounds being actively seen/treated by wound care.   Patient was seen 01/10/2024 for the evaluation of acute on chronic HFpEF BNP 40, pulmonary edema on CT chest. Echo shows EF 20-25% global HK, he declined IDC losartan  and spironolactone  added. Needs bmet.  Patient comes in with his son and caregiver. He is in wheelchair and very sleepy. Hard to hold his head up. On O2. Says breath ok. Getting extra salt.  ROS:    Studies Reviewed: [name]         Prior CV Studies:    Echocardiogram, 01/09/2024 Left ventricular ejection fraction, by estimation, is 20 to 25% . The left ventricle has severely decreased function. The left ventricle demonstrates global hypokinesis. The left ventricular internal cavity size was mildly dilated. Left ventricular diastolic parameters are indeterminate.  Right ventricular systolic function is mildly reduced. The right ventricular size is normal. There is normal pulmonary artery systolic pressure.  Left atrial size was mildly dilated.  Right atrial size was severely dilated.  Moderate pleural effusion in the left lateral region.  The mitral valve is normal in structure. Trivial mitral valve regurgitation. No evidence of mitral stenosis.  The aortic valve is tricuspid. Aortic valve regurgitation is mild. No aortic stenosis is present.  The inferior vena cava is normal in size with greater than 50% respiratory variability, suggesting right atrial pressure of 3 mmHg.    Risk Assessment/Calculations:    CHA2DS2-VASc Score = 7   This indicates a 11.2% annual risk of  stroke. The patient's score is based upon: CHF History: 1 HTN History: 1 Diabetes History: 0 Stroke History: 2 Vascular Disease History: 1 Age Score: 2 Gender Score: 0            Physical Exam:   VS:  BP 106/73   Pulse 72   Ht 5\' 7"  (1.702 m)   SpO2 94%   BMI 29.18 kg/m    Wt Readings from Last 3 Encounters:  01/11/24 186 lb 4.6 oz (84.5 kg)  11/27/23 190 lb (86.2 kg)  09/04/23 190 lb (86.2 kg)    GEN: Well nourished, well developed in no acute distress NECK: slight increase JVD; No carotid bruits CARDIAC: irreg irreg 2/6 systolic murmur LSB RESPIRATORY: decreased breath sounds with some crackles ABDOMEN: Soft, non-tender, non-distended EXTREMITIES:  No edema; No deformity   ASSESSMENT AND PLAN: .    Chronic systolic heart failure - Prior echocardiogram 2023 was normal EF 60%, now 20 to 25%. - No ICD. - ARB started, spironolactone  started -Continue with Lasix  40 mg p.o.  -check bmet and bnp today -2 gm sodium diet   Elevated troponin - Not ACS.  Myocardial injury in the setting of heart failure. No ischemic w/u given age and condition   Persistent atrial fibrillation -Only meets 1 criteria for reduced dose of Eliquis  age greater than 24.  Previously in 2017 his weight was less than 60 kg and in the setting of stroke had his Eliquis  decreased to 2.5 mg twice a day.  Remotely in 2002 required a transfusion for gastric  ulcer. Now on Eliquis  to 5 mg twice a day. Unable to weigh today -check bmet today   Mitral valve repair - Stable        Dispo: f/u Dr. [name] 3 months  Signed, Theotis Flake, PA-C

## 2024-01-21 ENCOUNTER — Encounter: Payer: Self-pay | Admitting: Family Medicine

## 2024-01-21 ENCOUNTER — Ambulatory Visit (INDEPENDENT_AMBULATORY_CARE_PROVIDER_SITE_OTHER): Admitting: Family Medicine

## 2024-01-21 ENCOUNTER — Telehealth: Payer: Self-pay

## 2024-01-21 VITALS — BP 106/72 | HR 52 | Temp 97.2°F | Ht 66.0 in

## 2024-01-21 DIAGNOSIS — Z09 Encounter for follow-up examination after completed treatment for conditions other than malignant neoplasm: Secondary | ICD-10-CM | POA: Diagnosis not present

## 2024-01-21 DIAGNOSIS — I509 Heart failure, unspecified: Secondary | ICD-10-CM | POA: Diagnosis not present

## 2024-01-21 LAB — BASIC METABOLIC PANEL WITH GFR
BUN: 17 mg/dL (ref 6–23)
CO2: 34 meq/L — ABNORMAL HIGH (ref 19–32)
Calcium: 8.8 mg/dL (ref 8.4–10.5)
Chloride: 97 meq/L (ref 96–112)
Creatinine, Ser: 0.9 mg/dL (ref 0.40–1.50)
GFR: 75.97 mL/min (ref >60.00)
Glucose, Bld: 125 mg/dL — ABNORMAL HIGH (ref 70–99)
Potassium: 4 meq/L (ref 3.5–5.1)
Sodium: 138 meq/L (ref 135–145)

## 2024-01-21 LAB — CBC
HCT: 31.9 % — ABNORMAL LOW (ref 39.0–52.0)
Hemoglobin: 9.8 g/dL — ABNORMAL LOW (ref 13.0–17.0)
MCHC: 30.5 g/dL (ref 30.0–36.0)
MCV: 75.7 fl — ABNORMAL LOW (ref 78.0–100.0)
Platelets: 289 10*3/uL (ref 150.0–400.0)
RBC: 4.22 Mil/uL (ref 4.22–5.81)
RDW: 18.7 % — ABNORMAL HIGH (ref 11.5–15.5)
WBC: 9.9 10*3/uL (ref 4.0–10.5)

## 2024-01-21 NOTE — Transitions of Care (Post Inpatient/ED Visit) (Signed)
   01/21/2024  Name: ARATH KAIGLER MRN: [id number] DOB: 07-22-1933  Today's TOC FU Call Status: Today's TOC FU Call Status:: Successful TOC FU Call Completed Patient's Name and Date of Birth confirmed.  Transition Care Management Follow-up Telephone Call Date of Discharge: 01/08/24 Discharge Facility: [name]) Type of Discharge: Emergency Department Reason for ED Visit: Other: How have you been since you were released from the hospital?: Same Any questions or concerns?: No  Items Reviewed: Did you receive and understand the discharge instructions provided?: Yes Medications obtained,verified, and reconciled?: Yes (Medications Reviewed) Any new allergies since your discharge?: No Dietary orders reviewed?: NA Do you have support at home?: Yes People in Home [RPT]: child(ren), adult  Medications Reviewed Today: Medications Reviewed Today   Medications were not reviewed in this encounter     Home Care and Equipment/Supplies: Were Home Health Services Ordered?: NA Any new equipment or medical supplies ordered?: NA  Functional Questionnaire: Do you need assistance with bathing/showering or dressing?: Yes Do you need assistance with meal preparation?: Yes Do you need assistance with eating?: Yes Do you have difficulty maintaining continence: Yes Do you need assistance with getting out of bed/getting out of a chair/moving?: Yes Do you have difficulty managing or taking your medications?: No  Follow up appointments reviewed: PCP Follow-up appointment confirmed?: Yes Date of PCP follow-up appointment?: 01/21/24 Follow-up Provider: Dr. Tilmon Font Specialist [facility] Follow-up appointment confirmed?: NA Do you need transportation to your follow-up appointment?: No Do you understand care options if your condition(s) worsen?: Yes-patient verbalized understanding    SIGNATURE Laroy Plunk, CMA

## 2024-01-21 NOTE — Progress Notes (Signed)
 Established Patient Office Visit   Subjective:  Patient ID: Brian [name], male    DOB: 12-04-1932  Age: 89 y.o. MRN: [id number]  Chief Complaint  Patient presents with   Hospitalization Follow-up    Pt is on office for follow on difficulty breathing. Pt caregiver states pt had som wheezing.     HPI Encounter Diagnoses  Name Primary?   Hospital discharge follow-up Yes   Chronic congestive heart failure, unspecified heart failure type [facility])    For hospital discharge follow-up status post acute on chronic CHF with hypoxia.  Echocardiogram demonstrated LVEF of 20% with severe left ventricular hypokinesis.  Losartan  and 12.5 mg of Aldactone  were added.  Denies shortness of breath.  He is sleeping on 1 pillow.  Follow-up with cardiology as planned for Monday.   Review of Systems  Constitutional: Negative.   HENT: Negative.    Eyes:  Negative for blurred vision, discharge and redness.  Respiratory: Negative.  Negative for shortness of breath.   Cardiovascular: Negative.  Negative for chest pain.  Gastrointestinal:  Negative for abdominal pain.  Genitourinary: Negative.   Musculoskeletal: Negative.  Negative for myalgias.  Skin:  Negative for rash.  Neurological:  Negative for tingling, loss of consciousness and weakness.  Endo/Heme/Allergies:  Negative for polydipsia.     Current Outpatient Medications:    ALPRAZolam  (XANAX ) 0.25 MG tablet, Take 0.25 mg by mouth 2 (two) times daily as needed for anxiety., Disp: , Rfl:    apixaban  (ELIQUIS ) 5 MG TABS tablet, Take 1 tablet (5 mg total) by mouth 2 (two) times daily., Disp: 60 tablet, Rfl: 3   cetirizine  (ZYRTEC ) 10 MG tablet, TAKE 1 TABLET(10 MG) BY MOUTH AT BEDTIME (Patient taking differently: Take 10 mg by mouth daily.), Disp: 90 tablet, Rfl: 2   clotrimazole  (LOTRIMIN ) 1 % cream, Apply 1 Application topically 2 (two) times daily as needed (for rash)., Disp: , Rfl:    finasteride  (PROSCAR ) 5 MG tablet, TAKE 1 TABLET(5 MG) BY MOUTH  DAILY, Disp: 90 tablet, Rfl: 3   fluticasone (FLONASE) 50 MCG/ACT nasal spray, Place 1 spray into both nostrils daily as needed for allergies., Disp: , Rfl:    furosemide  (LASIX ) 40 MG tablet, Take 1 tablet (40 mg total) by mouth daily., Disp: 90 tablet, Rfl: 3   gentamicin ointment (GARAMYCIN) 0.1 %, Apply topically as needed (pressure sore)., Disp: , Rfl:    hydrocortisone  ointment 0.5 %, Apply 1 Application topically daily as needed for itching., Disp: , Rfl:    losartan  (COZAAR ) 25 MG tablet, Take 0.5 tablets (12.5 mg total) by mouth daily., Disp: 30 tablet, Rfl: 3   melatonin 5 MG TABS, Take 5 mg by mouth at bedtime as needed (sleep)., Disp: , Rfl:    metoprolol  succinate (TOPROL -XL) 25 MG 24 hr tablet, Take 0.5 tablets (12.5 mg total) by mouth daily., Disp: 30 tablet, Rfl: 3   Multiple Vitamin (MULTIVITAMIN) tablet, Take 1 tablet by mouth 2 (two) times daily., Disp: , Rfl:    MUPIROCIN EX, Apply 1 Application topically daily as needed (wound care)., Disp: , Rfl:    oxyCODONE -acetaminophen  (PERCOCET) 7.5-325 MG tablet, Take 1 tablet by mouth 2 (two) times daily as needed for severe pain (pain score 7-10). (Patient taking differently: Take 1 tablet by mouth 2 (two) times daily.), Disp: 60 tablet, Rfl: 0   pantoprazole  (PROTONIX ) 40 MG tablet, Take 1 tablet (40 mg total) by mouth daily., Disp: 90 tablet, Rfl: 3   polyethylene glycol powder (GLYCOLAX /MIRALAX ) 17  GM/SCOOP powder, Mix one scoop with water and take daily until stools loosen. May repeat as needed. (Patient taking differently: Take 17 g by mouth daily as needed for mild constipation.), Disp: 850 g, Rfl: 1   pravastatin  (PRAVACHOL ) 40 MG tablet, TAKE 1 TABLET(40 MG) BY MOUTH DAILY, Disp: 90 tablet, Rfl: 3   pregabalin  (LYRICA ) 25 MG capsule, Take 2 capsules (50 mg total) by mouth at bedtime., Disp: 30 capsule, Rfl: 5   QUEtiapine  (SEROQUEL ) 50 MG tablet, TAKE 1 TABLET(50 MG) BY MOUTH AT BEDTIME, Disp: 90 tablet, Rfl: 3   sertraline   (ZOLOFT ) 50 MG tablet, TAKE 1 TABLET(50 MG) BY MOUTH EVERY MORNING, Disp: 90 tablet, Rfl: 3   spironolactone  (ALDACTONE ) 25 MG tablet, Take 0.5 tablets (12.5 mg total) by mouth daily., Disp: 30 tablet, Rfl: 3   tamsulosin  (FLOMAX ) 0.4 MG CAPS capsule, TAKE 1 CAPSULE(0.4 MG) BY MOUTH DAILY, Disp: 90 capsule, Rfl: 3   trimethoprim (TRIMPEX) 100 MG tablet, Take 100 mg by mouth at bedtime., Disp: , Rfl:    Objective:     BP 106/72 (Cuff Size: Normal)   Pulse (!) 52   Temp (!) 97.2 F (36.2 C) (Temporal)   Ht 5\' 6"  (1.676 m)   SpO2 98%   BMI 30.07 kg/m    Physical Exam Constitutional:      General: He is not in acute distress.    Appearance: He is not toxic-appearing or diaphoretic.  HENT:     Head: Normocephalic and atraumatic.     Right Ear: External ear normal.     Left Ear: External ear normal.  Eyes:     General: No scleral icterus.       Right eye: No discharge.        Left eye: No discharge.     Extraocular Movements: Extraocular movements intact.     Conjunctiva/sclera: Conjunctivae normal.  Cardiovascular:     Rate and Rhythm: Normal rate and regular rhythm.  Pulmonary:     Effort: No respiratory distress.     Breath sounds: No wheezing, rhonchi or rales.  Musculoskeletal:     Right lower leg: No edema.     Left lower leg: No edema.  Neurological:     Mental Status: He is alert.  Psychiatric:        Mood and Affect: Mood normal.        Behavior: Behavior normal.      No results found for any visits on 01/21/24.    The ASCVD Risk score (Arnett DK, et al., 2019) failed to calculate for the following reasons:   The 2019 ASCVD risk score is only valid for ages 83 to 25   Risk score cannot be calculated because patient has a medical history suggesting prior/existing ASCVD    Assessment & Plan:   Hospital discharge follow-up  Chronic congestive heart failure, unspecified heart failure type (HCC) -     Basic metabolic panel with GFR -     CBC    Return  in about 3 months (around 04/22/2024), or May give alprazolam  twice daily.  Continue follow-up with palliative care..  Continue Lasix  40 mg daily with Aldactone  12.5 and losartan  25 mg.  Discussed hospice care with his son, Garr Kalata.   Tonna Frederic, MD

## 2024-01-22 ENCOUNTER — Ambulatory Visit: Payer: Self-pay | Admitting: Family Medicine

## 2024-01-22 ENCOUNTER — Encounter (HOSPITAL_BASED_OUTPATIENT_CLINIC_OR_DEPARTMENT_OTHER): Attending: General Surgery | Admitting: General Surgery

## 2024-01-22 DIAGNOSIS — I4819 Other persistent atrial fibrillation: Secondary | ICD-10-CM | POA: Insufficient documentation

## 2024-01-22 DIAGNOSIS — Z8673 Personal history of transient ischemic attack (TIA), and cerebral infarction without residual deficits: Secondary | ICD-10-CM | POA: Insufficient documentation

## 2024-01-22 DIAGNOSIS — L98412 Non-pressure chronic ulcer of buttock with fat layer exposed: Secondary | ICD-10-CM | POA: Insufficient documentation

## 2024-01-22 DIAGNOSIS — L89023 Pressure ulcer of left elbow, stage 3: Secondary | ICD-10-CM | POA: Diagnosis present

## 2024-01-22 DIAGNOSIS — I2510 Atherosclerotic heart disease of native coronary artery without angina pectoris: Secondary | ICD-10-CM | POA: Insufficient documentation

## 2024-01-22 DIAGNOSIS — L98411 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: Secondary | ICD-10-CM | POA: Insufficient documentation

## 2024-01-28 ENCOUNTER — Encounter: Payer: Self-pay | Admitting: Physician Assistant

## 2024-01-28 ENCOUNTER — Ambulatory Visit: Attending: Physician Assistant | Admitting: Physician Assistant

## 2024-01-28 ENCOUNTER — Other Ambulatory Visit: Payer: Self-pay

## 2024-01-28 VITALS — BP 106/73 | HR 72 | Ht 67.0 in

## 2024-01-28 DIAGNOSIS — I5022 Chronic systolic (congestive) heart failure: Secondary | ICD-10-CM | POA: Diagnosis present

## 2024-01-28 DIAGNOSIS — Z9889 Other specified postprocedural states: Secondary | ICD-10-CM

## 2024-01-28 DIAGNOSIS — I4819 Other persistent atrial fibrillation: Secondary | ICD-10-CM

## 2024-01-28 DIAGNOSIS — R0609 Other forms of dyspnea: Secondary | ICD-10-CM

## 2024-01-28 DIAGNOSIS — D6859 Other primary thrombophilia: Secondary | ICD-10-CM

## 2024-01-28 DIAGNOSIS — I25118 Atherosclerotic heart disease of native coronary artery with other forms of angina pectoris: Secondary | ICD-10-CM

## 2024-01-28 NOTE — Patient Instructions (Signed)
 Medication Instructions:  No Changes  Lab Work: Today: BMET and BNP  If you have labs (blood work) drawn today and your tests are completely normal, you will receive your results only by: MyChart Message (if you have MyChart) OR A paper copy in the mail If you have any lab test that is abnormal or we need to change your treatment, we will call you to review the results.  Follow-Up: At [facility], you and your health needs are our priority.  As part of our continuing mission to provide you with exceptional heart care, our providers are all part of one team.  This team includes your primary Cardiologist (physician) and Advanced Practice Providers or APPs (Physician Assistants and Nurse Practitioners) who all work together to provide you with the care you need, when you need it.  Your next appointment:   2 -3 month(s)  Provider:   Maudine Sos, MD   Other Instructions Please limit your Sodium (Salt) intake to 2 gm (2,000 mg) per Day.  DASH stands for Dietary Approaches to Stop Hypertension. The DASH diet is a healthy-eating plan designed to help treat or prevent high blood pressure (hypertension).  The DASH diet includes foods that are rich in potassium, calcium  and magnesium . These nutrients help control blood pressure. The diet limits foods that are high in sodium, saturated fat and added sugars.  Studies have shown that the DASH diet can lower blood pressure in as little as two weeks. The diet can also lower low-density lipoprotein (LDL or "bad") cholesterol levels in the blood. High blood pressure and high LDL cholesterol levels are two major risk factors for heart disease and stroke.    DASH diet: Recommended servings The DASH diet provides daily and weekly nutritional goals. The number of servings you should have depends on your daily calorie needs.  Here's a look at the recommended servings from each food group for a 2,000-calorie-a-day DASH diet:  Grains: 6 to  8 servings a day. One serving is one slice bread, 1 ounce dry cereal, or 1/2 cup cooked cereal, rice or pasta. Vegetables: 4 to 5 servings a day. One serving is 1 cup raw leafy green vegetable, 1/2 cup cut-up raw or cooked vegetables, or 1/2 cup vegetable juice. Fruits: 4 to 5 servings a day. One serving is one medium fruit, 1/2 cup fresh, frozen or canned fruit, or 1/2 cup fruit juice. Fat-free or low-fat dairy products: 2 to 3 servings a day. One serving is 1 cup milk or yogurt, or 1 1/2 ounces cheese. Lean meats, poultry and fish: six 1-ounce servings or fewer a day. One serving is 1 ounce cooked meat, poultry or fish, or 1 egg. Nuts, seeds and legumes: 4 to 5 servings a week. One serving is 1/3 cup nuts, 2 tablespoons peanut butter, 2 tablespoons seeds, or 1/2 cup cooked legumes (dried beans or peas). Fats and oils: 2 to 3 servings a day. One serving is 1 teaspoon soft margarine, 1 teaspoon vegetable oil, 1 tablespoon mayonnaise or 2 tablespoons salad dressing. Sweets and added sugars: 5 servings or fewer a week. One serving is 1 tablespoon sugar, jelly or jam, 1/2 cup sorbet, or 1 cup lemonade.

## 2024-01-29 ENCOUNTER — Other Ambulatory Visit: Payer: Self-pay

## 2024-01-29 ENCOUNTER — Ambulatory Visit: Payer: Self-pay | Admitting: Physician Assistant

## 2024-01-29 DIAGNOSIS — I5022 Chronic systolic (congestive) heart failure: Secondary | ICD-10-CM

## 2024-01-29 DIAGNOSIS — I10 Essential (primary) hypertension: Secondary | ICD-10-CM

## 2024-01-29 LAB — BASIC METABOLIC PANEL WITH GFR
BUN/Creatinine Ratio: 23 (ref 10–24)
BUN: 20 mg/dL (ref 8–27)
CO2: 26 mmol/L (ref 20–29)
Calcium: 8.8 mg/dL (ref 8.6–10.2)
Chloride: 97 mmol/L (ref 96–106)
Creatinine, Ser: 0.88 mg/dL (ref 0.76–1.27)
Glucose: 93 mg/dL (ref 70–99)
Potassium: 4.9 mmol/L (ref 3.5–5.2)
Sodium: 138 mmol/L (ref 134–144)
eGFR: 82 mL/min/{1.73_m2} (ref >59)

## 2024-01-29 LAB — PRO B NATRIURETIC PEPTIDE: NT-Pro BNP: 3035 pg/mL — ABNORMAL HIGH (ref 0–486)

## 2024-01-29 MED ORDER — FUROSEMIDE 20 MG PO TABS: 20.0000 mg | ORAL_TABLET | Freq: Every day | ORAL | 3 refills | Status: DC

## 2024-02-06 LAB — BASIC METABOLIC PANEL WITH GFR
BUN/Creatinine Ratio: 22 (ref 10–24)
BUN: 16 mg/dL (ref 8–27)
CO2: 27 mmol/L (ref 20–29)
Calcium: 8.7 mg/dL (ref 8.6–10.2)
Chloride: 96 mmol/L (ref 96–106)
Creatinine, Ser: 0.72 mg/dL — ABNORMAL LOW (ref 0.76–1.27)
Glucose: 91 mg/dL (ref 70–99)
Potassium: 4.4 mmol/L (ref 3.5–5.2)
Sodium: 137 mmol/L (ref 134–144)
eGFR: 87 mL/min/{1.73_m2} (ref >59)

## 2024-02-06 LAB — BRAIN NATRIURETIC PEPTIDE: BNP: 456.2 pg/mL — ABNORMAL HIGH (ref 0.0–100.0)

## 2024-02-07 ENCOUNTER — Ambulatory Visit: Payer: Self-pay | Admitting: Physician Assistant

## 2024-02-14 NOTE — Telephone Encounter (Signed)
 Copied from CRM [id number]. Topic: Clinical - Home Health Verbal Orders >> Feb 14, 2024  2:35 PM Mesmerise C wrote: Caller/Agency: [facility] Callback Number: [phone] Service Requested: Physical Therapy Frequency: 1 week 1 2 week 3 1 week 1 Any new concerns about the patient? Yes Patient stated he feels burning sensation when he pees uses catheter the son stated he's taking antibiotics nurse states they don't know if he's still taking them. Nurse needs list of updated medications faxed to [phone], caregivers state he's still feeling confused and fatigued

## 2024-02-19 ENCOUNTER — Ambulatory Visit: Attending: Nurse Practitioner | Admitting: Nurse Practitioner

## 2024-02-19 ENCOUNTER — Other Ambulatory Visit: Payer: Self-pay | Admitting: *Deleted

## 2024-02-19 ENCOUNTER — Encounter: Payer: Self-pay | Admitting: Nurse Practitioner

## 2024-02-19 VITALS — BP 136/118 | HR 99 | Temp 98.8°F | Resp 18 | Ht 66.0 in | Wt 200.0 lb

## 2024-02-19 DIAGNOSIS — M1711 Unilateral primary osteoarthritis, right knee: Secondary | ICD-10-CM | POA: Diagnosis present

## 2024-02-19 DIAGNOSIS — G894 Chronic pain syndrome: Secondary | ICD-10-CM | POA: Insufficient documentation

## 2024-02-19 DIAGNOSIS — M5416 Radiculopathy, lumbar region: Secondary | ICD-10-CM | POA: Insufficient documentation

## 2024-02-19 DIAGNOSIS — M170 Bilateral primary osteoarthritis of knee: Secondary | ICD-10-CM | POA: Insufficient documentation

## 2024-02-19 DIAGNOSIS — M47816 Spondylosis without myelopathy or radiculopathy, lumbar region: Secondary | ICD-10-CM | POA: Diagnosis not present

## 2024-02-19 DIAGNOSIS — M5136 Other intervertebral disc degeneration, lumbar region with discogenic back pain only: Secondary | ICD-10-CM | POA: Diagnosis not present

## 2024-02-19 DIAGNOSIS — G8929 Other chronic pain: Secondary | ICD-10-CM | POA: Diagnosis present

## 2024-02-19 MED ORDER — PREGABALIN 25 MG PO CAPS: 50.0000 mg | ORAL_CAPSULE | Freq: Every day | ORAL | 5 refills | Status: AC

## 2024-02-19 MED ORDER — OXYCODONE-ACETAMINOPHEN 7.5-325 MG PO TABS: 1.0000 | ORAL_TABLET | Freq: Two times a day (BID) | ORAL | 0 refills | Status: AC | PRN

## 2024-02-19 NOTE — Progress Notes (Signed)
Nursing Pain Medication Assessment:  Safety precautions to be maintained throughout the outpatient stay will include: orient to surroundings, keep bed in low position, maintain call bell within reach at all times, provide assistance with transfer out of bed and ambulation.  Medication Inspection Compliance: Brian [name] did not comply with our request to bring his pills to be counted. He was reminded that bringing the medication bottles, even when empty, is a requirement.  Medication: None brought in. Pill/Patch Count: None available to be counted. Bottle Appearance: No container available. Did not bring bottle(s) to appointment. Filled Date: N/A Last Medication intake:  Today 

## 2024-02-19 NOTE — Progress Notes (Signed)
 PROVIDER NOTE: Interpretation of information contained herein should be left to medically-trained personnel. Specific patient instructions are provided elsewhere under Patient Instructions section of medical record. This document was created in part using AI and STT-dictation technology, any transcriptional errors that may result from this process are unintentional.  Patient: Brian [name]  Service: E/M   PCP: Berneta Elsie Sayre, MD  DOB: [birth year]  DOS: 02/19/2024  Provider: Emmy MARLA Blanch, NP  MRN: [id number]  Delivery: Face-to-face  Specialty: Interventional Pain Management  Type: Established Patient  Setting: Ambulatory outpatient facility  Specialty designation: 09  Referring Prov.: [facility]  Location: Outpatient office facility       History of present illness (HPI) [name], a 89 y.o. year old male, is here today because of his Chronic pain syndrome [G89.4]. Mr. Gillson primary complain today is Back Pain  Pertinent problems: Mr. Montesinos has Dysarthria, post stroke; Chronic radicular lumbar pain; Gait disturbance; Paraplegic immobility syndrome; History of cerebrovascular accident; Depression with anxiety; Lumbar spondylosis; Lumbar degenerative disc disease; and Chronic pain syndrome on their pertinent problem list.  Pain Assessment: Severity of Chronic pain is reported as a 5 /10. Location: Back Left/Pain Radiates down to left elbow and left side. Onset: More than a month ago. Quality:  . Timing: Constant. Modifying factor(s): Reposition, medication. Vitals:  height is 5' 6 (1.676 m) and weight is 200 lb (90.7 kg). His temperature is 98.8 F (37.1 C). His blood pressure is 136/118 (abnormal) and his pulse is 99. His respiration is 18 and oxygen saturation is 100%.  BMI: Estimated body mass index is 32.28 kg/m as calculated from the following:   Height as of this encounter: 5' 6 (1.676 m).   Weight as of this encounter: 200 lb (90.7 kg).  Last encounter:  11/27/2023 Last procedure: Visit date not found.  Reason for encounter: medication management. No change in medical history since last visit.  Patient's pain is at baseline.  Patient continues multimodal pain regimen as prescribed.  States that it provides pain relief and improvement in functional status.   Pharmacotherapy Assessment   Analgesic:  Oxycodone -acetaminophen  (Percocet) 7.5-325 MG tablet 2 times daily as needed for severe pain. MME=23  Monitoring: [place] PMP: PDMP reviewed during this encounter.       Pharmacotherapy: No side-effects or adverse reactions reported. Compliance: No problems identified. Effectiveness: Clinically acceptable.  Erlene Doyal SAUNDERS, [place]  02/19/2024  2:57 PM  Sign when Signing Visit Nursing Pain Medication Assessment:  Safety precautions to be maintained throughout the outpatient stay will include: orient to surroundings, keep bed in low position, maintain call bell within reach at all times, provide assistance with transfer out of bed and ambulation.  Medication Inspection Compliance: Mr. Stogdill did not comply with our request to bring his pills to be counted. He was reminded that bringing the medication bottles, even when empty, is a requirement.  Medication: None brought in. Pill/Patch Count: None available to be counted. Bottle Appearance: No container available. Did not bring bottle(s) to appointment. Filled Date: N/A Last Medication intake:  Today    UDS:  Summary  Date Value Ref Range Status  12/27/2021 Note  Final    Comment:    ==================================================================== Compliance Drug Analysis, Ur ==================================================================== Test                             Result       Flag  Units  Drug Present and Declared for Prescription Verification   Oxycodone                       2038         EXPECTED   ng/mg creat   Oxymorphone                    6930         EXPECTED   ng/mg  creat   Noroxycodone                   746          EXPECTED   ng/mg creat   Noroxymorphone                 488          EXPECTED   ng/mg creat    Sources of oxycodone  are scheduled prescription medications.    Oxymorphone, noroxycodone, and noroxymorphone are expected    metabolites of oxycodone . Oxymorphone is also available as a    scheduled prescription medication.    Pregabalin                      PRESENT      EXPECTED   Sertraline                      PRESENT      EXPECTED   Desmethylsertraline            PRESENT      EXPECTED    Desmethylsertraline is an expected metabolite of sertraline .    Quetiapine                      PRESENT      EXPECTED   Acetaminophen                   PRESENT      EXPECTED  Drug Absent but Declared for Prescription Verification   Alprazolam                      Not Detected UNEXPECTED ng/mg creat   Metoprolol                      Not Detected UNEXPECTED ==================================================================== Test                      Result    Flag   Units      Ref Range   Creatinine              100              mg/dL      >=79 ==================================================================== Declared Medications:  The flagging and interpretation on this report are based on the  following declared medications.  Unexpected results may arise from  inaccuracies in the declared medications.   **Note: The testing scope of this panel includes these medications:   Alprazolam  (Xanax )  Metoprolol   Oxycodone  (Percocet)  Pregabalin  (Lyrica )  Quetiapine  (Seroquel )  Sertraline  (Zoloft )   **Note: The testing scope of this panel does not include small to  moderate amounts of these reported medications:   Acetaminophen  (Tylenol )  Acetaminophen  (Percocet)   **Note: The testing scope of this panel does not include the  following reported medications:   Apixaban  (Eliquis )  Cetirizine  (Zyrtec )  Finasteride  (Proscar )  Losartan  (Cozaar )   Multivitamin  Pantoprazole  (Protonix )  Polyethylene Glycol (MiraLAX )  Pravastatin   Tamsulosin  (Flomax ) ==================================================================== For clinical consultation, please call [phone]. ====================================================================     No results found for: CBDTHCR No results found for: D8THCCBX No results found for: D9THCCBX  ROS  Constitutional: Denies any fever or chills Gastrointestinal: No reported hemesis, hematochezia, vomiting, or acute GI distress Musculoskeletal: Lower back pain, elbow pain Neurological: No reported episodes of acute onset apraxia, aphasia, dysarthria, agnosia, amnesia, paralysis, loss of coordination, or loss of consciousness  Medication Review  ALPRAZolam , Mupirocin, QUEtiapine , apixaban , cetirizine , clotrimazole , finasteride , fluticasone, furosemide , gentamicin ointment, hydrocortisone  ointment, losartan , melatonin, metoprolol  succinate, multivitamin, oxyCODONE -acetaminophen , pantoprazole , polyethylene glycol powder, pravastatin , pregabalin , sertraline , spironolactone , tamsulosin , and trimethoprim  History Review  Allergy: Mr. Sitter is allergic to novocain [procaine hcl], other, and penicillins. Drug: Mr. Sporer  reports no history of drug use. Alcohol:  reports no history of alcohol use. Tobacco:  reports that he has quit smoking. He has never used smokeless tobacco. Social: Mr. Zapf  reports that he has quit smoking. He has never used smokeless tobacco. He reports that he does not drink alcohol and does not use drugs. Medical:  has a past medical history of Anemia, Atrial flutter (HCC), BPH (benign prostatic hypertrophy), Epistaxis, Hemorrhage of gastrointestinal tract, unspecified, History of open heart surgery, Hypertension, Obesity (BMI 30.0-34.9) (06/29/2020), Osteoarthrosis, unspecified whether generalized or localized, unspecified site, Personal history of venous thrombosis and  embolism, Rosacea, Stroke (HCC), and TIA (transient ischemic attack). Surgical: Mr. Kincheloe  has a past surgical history that includes Appendectomy; Hernia repair; Total hip arthroplasty; Joint replacement; mvp repair; Mitral valve repair; Radiology with anesthesia (N/A, 05/04/2016); ir generic historical (05/04/2016); ir generic historical (05/04/2016); and ir generic historical (06/07/2016). Family: family history includes Coronary artery disease in his father; Rheumatic fever in his mother.  Laboratory Chemistry Profile   Renal Lab Results  Component Value Date   BUN 16 02/05/2024   CREATININE 0.72 (L) 02/05/2024   BCR 22 02/05/2024   GFR 75.97 01/21/2024   GFRAA >60 11/03/2018   GFRNONAA >60 01/11/2024    Hepatic Lab Results  Component Value Date   AST 28 12/25/2023   ALT 25 12/25/2023   ALBUMIN 3.9 12/25/2023   ALKPHOS 82 12/25/2023   AMYLASE 50 12/25/2023   LIPASE 14.0 12/25/2023    Electrolytes Lab Results  Component Value Date   NA 137 02/05/2024   K 4.4 02/05/2024   CL 96 02/05/2024   CALCIUM  8.7 02/05/2024   MG 1.9 01/11/2024   PHOS 3.5 01/09/2024    Bone No results found for: VD25OH, CI874NY7UNU, CI6874NY7, CI7874NY7, 25OHVITD1, 25OHVITD2, 25OHVITD3, TESTOFREE, TESTOSTERONE  Inflammation (CRP: Acute Phase) (ESR: Chronic Phase) Lab Results  Component Value Date   LATICACIDVEN 1.9 03/15/2022         Note: Above Lab results reviewed.  Recent Imaging Review  ECHOCARDIOGRAM COMPLETE    ECHOCARDIOGRAM REPORT       Patient Name:   DEQUON SCHNEBLY Date of Exam: 01/09/2024 Medical Rec #:  [id number]      Height:       66.0 in Accession #:    [id number]     Weight:       191.8 lb Date of Birth:  1933/07/24      BSA:          1.965 m Patient Age:    89 years       BP:           122/86 mmHg Patient Gender: M  HR:           88 bpm. Exam Location:  Inpatient  Procedure: 2D Echo, Cardiac Doppler, Color Doppler and Intracardiac             Opacification Agent (Both Spectral and Color Flow Doppler were            utilized during procedure).  Indications:    CHF   History:        Patient has prior history of Echocardiogram examinations, most                 recent 03/15/2022. Risk Factors:Hypertension.   Sonographer:    Philomena Daring Referring Phys: CAROLE N HALL  IMPRESSIONS   1. Left ventricular ejection fraction, by estimation, is 20 to 25%. The left ventricle has severely decreased function. The left ventricle demonstrates global hypokinesis. The left ventricular internal cavity size was mildly dilated. Left ventricular  diastolic parameters are indeterminate.  2. Right ventricular systolic function is mildly reduced. The right ventricular size is normal. There is normal pulmonary artery systolic pressure.  3. Left atrial size was mildly dilated.  4. Right atrial size was severely dilated.  5. Moderate pleural effusion in the left lateral region.  6. The mitral valve is normal in structure. Trivial mitral valve regurgitation. No evidence of mitral stenosis.  7. The aortic valve is tricuspid. Aortic valve regurgitation is mild. No aortic stenosis is present.  8. The inferior vena cava is normal in size with greater than 50% respiratory variability, suggesting right atrial pressure of 3 mmHg.  FINDINGS  Left Ventricle: Left ventricular ejection fraction, by estimation, is 20 to 25%. The left ventricle has severely decreased function. The left ventricle demonstrates global hypokinesis. The left ventricular internal cavity size was mildly dilated. There  is no left ventricular hypertrophy. Left ventricular diastolic parameters are indeterminate.  Right Ventricle: The right ventricular size is normal. No increase in right ventricular wall thickness. Right ventricular systolic function is mildly reduced. There is normal pulmonary artery systolic pressure. The tricuspid regurgitant velocity is 2.31  m/s, and with an assumed right  atrial pressure of 3 mmHg, the estimated right ventricular systolic pressure is 24.3 mmHg.  Left Atrium: Left atrial size was mildly dilated.  Right Atrium: Right atrial size was severely dilated.  Pericardium: There is no evidence of pericardial effusion.  Mitral Valve: The mitral valve is normal in structure. Trivial mitral valve regurgitation. No evidence of mitral valve stenosis.  Tricuspid Valve: The tricuspid valve is normal in structure. Tricuspid valve regurgitation is trivial. No evidence of tricuspid stenosis.  Aortic Valve: The aortic valve is tricuspid. Aortic valve regurgitation is mild. No aortic stenosis is present. Aortic valve mean gradient measures 4.0 mmHg. Aortic valve peak gradient measures 7.6 mmHg. Aortic valve area, by VTI measures 2.99 cm.  Pulmonic Valve: The pulmonic valve was normal in structure. Pulmonic valve regurgitation is trivial. No evidence of pulmonic stenosis.  Aorta: The aortic root is normal in size and structure.  Venous: The inferior vena cava is normal in size with greater than 50% respiratory variability, suggesting right atrial pressure of 3 mmHg.  IAS/Shunts: No atrial level shunt detected by color flow Doppler.  Additional Comments: There is a moderate pleural effusion in the left lateral region.    LEFT VENTRICLE PLAX 2D LVIDd:         5.90 cm   Diastology LVIDs:         5.30 cm   LV e' lateral:  10.10 cm/s LV PW:         1.00 cm LV IVS:        1.10 cm LVOT diam:     2.30 cm LV SV:         64 LV SV Index:   33 LVOT Area:     4.15 cm    RIGHT VENTRICLE             IVC RV S prime:     11.60 cm/s  IVC diam: 2.40 cm TAPSE (M-mode): 1.4 cm  LEFT ATRIUM             Index        RIGHT ATRIUM           Index LA diam:        3.90 cm 1.99 cm/m   RA Area:     28.10 cm LA Vol (A2C):   89.8 ml 45.71 ml/m  RA Volume:   101.00 ml 51.41 ml/m LA Vol (A4C):   59.6 ml 30.34 ml/m LA Biplane Vol: 74.7 ml 38.02 ml/m  AORTIC VALVE AV Area  (Vmax):    3.16 cm AV Area (Vmean):   3.05 cm AV Area (VTI):     2.99 cm AV Vmax:           138.00 cm/s AV Vmean:          95.500 cm/s AV VTI:            0.214 m AV Peak Grad:      7.6 mmHg AV Mean Grad:      4.0 mmHg LVOT Vmax:         105.00 cm/s LVOT Vmean:        70.200 cm/s LVOT VTI:          0.154 m LVOT/AV VTI ratio: 0.72   AORTA Ao Root diam: 3.40 cm Ao Asc diam:  3.20 cm  TRICUSPID VALVE TR Peak grad:   21.3 mmHg TR Vmax:        231.00 cm/s   SHUNTS Systemic VTI:  0.15 m Systemic Diam: 2.30 cm  Annabella Scarce MD Electronically signed by Annabella Scarce MD Signature Date/Time: 01/09/2024/1:47:23 PM      Final   Note: Reviewed        Physical Exam  Vitals: BP (!) 136/118   Pulse 99   Temp 98.8 F (37.1 C)   Resp 18   Ht 5' 6 (1.676 m)   Wt 200 lb (90.7 kg)   SpO2 100%   BMI 32.28 kg/m  BMI: Estimated body mass index is 32.28 kg/m as calculated from the following:   Height as of this encounter: 5' 6 (1.676 m).   Weight as of this encounter: 200 lb (90.7 kg). Ideal: Ideal body weight: 63.8 kg (140 lb 10.5 oz) Adjusted ideal body weight: 74.6 kg (164 lb 6.3 oz) General appearance: Well nourished, well developed, and well hydrated. In no apparent acute distress Mental status: Alert, oriented x 3 (person, place, & time)       Respiratory: No evidence of acute respiratory distress Eyes: PERLA Assessment   Diagnosis Status  1. Chronic pain syndrome   2. Degeneration of intervertebral disc of lumbar region with discogenic back pain   3. Chronic radicular lumbar pain   4. Primary osteoarthritis of right knee   5. Lumbar spondylosis   6. Bilateral primary osteoarthritis of knee    Controlled Controlled Controlled   Updated Problems: No problems updated.  Plan of Care  Problem-specific:  Assessment and Plan  We will continue on current medication regimen.  Prescribing drug monitoring (PDMP) reviewed; findings consistent with the use of  prescribed medication and no evidence of narcotic misuse or abuse.  Blood work order for drug serum.  Schedule follow-up in 90 days for medication management.  No other new issues or problems reported to this visit.  Mr. ASHLEIGH ARYA has a current medication list which includes the following long-term medication(s): apixaban , cetirizine , furosemide , losartan , metoprolol  succinate, pantoprazole , pravastatin , pregabalin , quetiapine , sertraline , and spironolactone .  Pharmacotherapy (Medications Ordered): Meds ordered this encounter  Medications   oxyCODONE -acetaminophen  (PERCOCET) 7.5-325 MG tablet    Sig: Take 1 tablet by mouth every 12 (twelve) hours as needed for moderate pain (pain score 4-6) or severe pain (pain score 7-10). Must last 30 days.    Dispense:  60 tablet    Refill:  0    Chronic Pain: STOP Act (Not applicable) Fill 1 day early if closed on refill date. Avoid benzodiazepines within 8 hours of opioids   oxyCODONE -acetaminophen  (PERCOCET) 7.5-325 MG tablet    Sig: Take 1 tablet by mouth every 12 (twelve) hours as needed for moderate pain (pain score 4-6) or severe pain (pain score 7-10). Must last 30 days.    Dispense:  60 tablet    Refill:  0    Chronic Pain: STOP Act (Not applicable) Fill 1 day early if closed on refill date. Avoid benzodiazepines within 8 hours of opioids   oxyCODONE -acetaminophen  (PERCOCET) 7.5-325 MG tablet    Sig: Take 1 tablet by mouth every 12 (twelve) hours as needed for moderate pain (pain score 4-6) or severe pain (pain score 7-10). Must last 30 days.    Dispense:  60 tablet    Refill:  0    Chronic Pain: STOP Act (Not applicable) Fill 1 day early if closed on refill date. Avoid benzodiazepines within 8 hours of opioids   pregabalin  (LYRICA ) 25 MG capsule    Sig: Take 2 capsules (50 mg total) by mouth at bedtime.    Dispense:  30 capsule    Refill:  5   Orders:  Orders Placed This Encounter  Procedures   Drug Screen 10 W/Conf, Serum    Release  to patient:   Immediate        Return in about 3 months (around 05/21/2024) for (F2F), (MM), Emmy Blanch NP.    Recent Visits Date Type Provider Dept  11/27/23 Office Visit Marcelino Nurse, MD [facility]  Showing recent visits within past 90 days and meeting all other requirements Today's Visits Date Type Provider Dept  02/19/24 Office Visit Lauryl Seyer K, NP [facility]  Showing today's visits and meeting all other requirements Future Appointments No visits were found meeting these conditions. Showing future appointments within next 90 days and meeting all other requirements  I discussed the assessment and treatment plan with the patient. The patient was provided an opportunity to ask questions and all were answered. The patient agreed with the plan and demonstrated an understanding of the instructions.  Patient advised to call back or seek an in-person evaluation if the symptoms or condition worsens.  Duration of encounter: 30 minutes.  Total time on encounter, as per AMA guidelines included both the face-to-face and non-face-to-face time personally spent by the physician and/or other qualified health care professional(s) on the day of the encounter (includes time in activities that require the physician or other qualified health care  professional and does not include time in activities normally performed by clinical staff). Physician's time may include the following activities when performed: Preparing to see the patient (e.g., pre-charting review of records, searching for previously ordered imaging, lab work, and nerve conduction tests) Review of prior analgesic pharmacotherapies. Reviewing PMP Interpreting ordered tests (e.g., lab work, imaging, nerve conduction tests) Performing post-procedure evaluations, including interpretation of diagnostic procedures Obtaining and/or reviewing separately obtained history Performing a medically appropriate examination  and/or evaluation Counseling and educating the patient/family/caregiver Ordering medications, tests, or procedures Referring and communicating with other health care professionals (when not separately reported) Documenting clinical information in the electronic or other health record Independently interpreting results (not separately reported) and communicating results to the patient/ family/caregiver Care coordination (not separately reported)  Note by: Raahil Ong K Kaedance Magos, NP (TTS and AI technology used. I apologize for any typographical errors that were not detected and corrected.) Date: 02/19/2024; Time: 3:18 PM

## 2024-02-20 ENCOUNTER — Telehealth: Payer: Self-pay

## 2024-02-20 NOTE — Telephone Encounter (Signed)
 Form is in providers box to be signed tomorrow. Dm/cma

## 2024-02-20 NOTE — Telephone Encounter (Signed)
 Copied from CRM [id number]. Topic: General - Other >> Feb 20, 2024  3:30 PM Geroldine GRADE wrote: Reason for CRM: [facility] is calling to see if the patients physical therapy has been approved

## 2024-02-21 NOTE — Telephone Encounter (Unsigned)
 Copied from CRM [id number]. Topic: General - Other >> Feb 20, 2024  3:30 PM Geroldine GRADE wrote: Reason for CRM: [facility] is calling to see if the patients physical therapy has been approved

## 2024-02-29 ENCOUNTER — Ambulatory Visit: Payer: Self-pay | Admitting: *Deleted

## 2024-02-29 NOTE — Telephone Encounter (Signed)
         FYI Only or Action Required?: Action required by provider: update on patient condition.  Patient was last seen in primary care on 01/21/2024 by Berneta Elsie Sayre, MD.  Called Nurse Triage reporting Facial Swelling.  Symptoms began today.  Interventions attempted: Nothing.  Symptoms are: gradually worsening.  Triage Disposition: See PCP When Office is Open (Within 3 Days)  Patient/caregiver understands and will follow disposition?: No, wishes to speak with PCP                Copied from CRM [id number]. Topic: Clinical - Red Word Triage >> Feb 29, 2024  2:45 PM [place] E wrote: [facility] that prompted transfer to Nurse Triage: Mallory, LPN with [facility] called to report an abnrmal BP, 134/96 from today. Patient also has left side face swelling and his left arm is retaining some fluid. Reason for Disposition  [1] MILD face swelling (e.g., puffiness) AND [2] persists > 3 days  Answer Assessment - Initial Assessment Questions HH, LPN Mallory calling to report patient with elevated BP 134/96 , left facial swelling mild to cheek bone area. Left arm swelling . Offered appt for Monday and LPN reports she will notify patient son to call back to schedule appt for f/u of sx . Recommended if any sx regarding elevated BP go to ED.     1. ONSET: When did the swelling start? (e.g., minutes, hours, days)     Unsure noticed by LPN 2. LOCATION: What part of the face is swollen? (e.g., cheek, entire face, jaw joint area, under jaw)     Left side of face  3. SEVERITY: How swollen is it?     Not severe 4. ITCHING: Is there any itching? If Yes, ask: How much?   (Scale 1-10; mild, moderate or severe)     Na  5. PAIN: Is the swelling painful to touch? If Yes, ask: How painful is it?   (Scale 0-10; mild, moderate or severe)     No  6. FEVER: Do you have a fever? If Yes, ask: What is it, how was it measured, and when did it start?      No  7. CAUSE: What  do you think is causing the face swelling?     Not sure just started using oxygen via [place]  8. NEW MEDICINES: Have there been any new medicines started recently?     Oxygen  9. RECURRENT SYMPTOM: Have you had face swelling before? If Yes, ask: When was the last time? What happened that time?     Na  10. OTHER SYMPTOMS: Do you have any other symptoms? (e.g., leg swelling, toothache)       Left facial pain in cheek bone area. Just started using oxygen  BP elevated today 134/96 per [facility] LPN. Left arm swelling  11. PREGNANCY: Is there any chance you are pregnant? When was your last menstrual period?       na  Protocols used: Face Swelling-A-AH

## 2024-03-05 ENCOUNTER — Telehealth: Payer: Self-pay | Admitting: Family Medicine

## 2024-03-05 LAB — DRUG SCREEN 10 W/CONF, SERUM
Amphetamines, IA: NEGATIVE ng/mL
Barbiturates, IA: NEGATIVE ug/mL
Benzodiazepines, IA: NEGATIVE ng/mL
Cocaine & Metabolite, IA: NEGATIVE ng/mL
Methadone, IA: NEGATIVE ng/mL
Opiates, IA: NEGATIVE ng/mL
Oxycodones, IA: POSITIVE ng/mL — AB
Phencyclidine, IA: NEGATIVE ng/mL
Propoxyphene, IA: NEGATIVE ng/mL
THC(Marijuana) Metabolite, IA: NEGATIVE ng/mL

## 2024-03-05 LAB — OXYCODONES,MS,WB/SP RFX
Oxycocone: 8.8 ng/mL
Oxycodones Confirmation: POSITIVE
Oxymorphone: NEGATIVE ng/mL

## 2024-03-05 LAB — OPIATES,MS,WB/SP RFX
6-Acetylmorphine: NEGATIVE
Codeine: NEGATIVE ng/mL
Dihydrocodeine: NEGATIVE ng/mL
Hydrocodone: NEGATIVE ng/mL
Hydromorphone: NEGATIVE ng/mL
Morphine: NEGATIVE ng/mL
Opiate Confirmation: NEGATIVE

## 2024-03-05 NOTE — Telephone Encounter (Signed)
 ERROR

## 2024-03-06 ENCOUNTER — Other Ambulatory Visit: Payer: Self-pay | Admitting: Nurse Practitioner

## 2024-03-06 DIAGNOSIS — G894 Chronic pain syndrome: Secondary | ICD-10-CM

## 2024-03-11 ENCOUNTER — Encounter (HOSPITAL_BASED_OUTPATIENT_CLINIC_OR_DEPARTMENT_OTHER): Attending: General Surgery | Admitting: General Surgery

## 2024-03-11 DIAGNOSIS — I6300 Cerebral infarction due to thrombosis of unspecified precerebral artery: Secondary | ICD-10-CM | POA: Diagnosis not present

## 2024-03-11 DIAGNOSIS — L98412 Non-pressure chronic ulcer of buttock with fat layer exposed: Secondary | ICD-10-CM | POA: Insufficient documentation

## 2024-03-11 DIAGNOSIS — I4821 Permanent atrial fibrillation: Secondary | ICD-10-CM | POA: Insufficient documentation

## 2024-03-11 DIAGNOSIS — I2510 Atherosclerotic heart disease of native coronary artery without angina pectoris: Secondary | ICD-10-CM | POA: Insufficient documentation

## 2024-03-11 DIAGNOSIS — L89023 Pressure ulcer of left elbow, stage 3: Secondary | ICD-10-CM | POA: Insufficient documentation

## 2024-03-11 DIAGNOSIS — L98411 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: Secondary | ICD-10-CM | POA: Diagnosis not present

## 2024-03-12 ENCOUNTER — Telehealth: Payer: Self-pay | Admitting: Family Medicine

## 2024-03-12 NOTE — Telephone Encounter (Unsigned)
 Copied from CRM [id number]. Topic: Clinical - Home Health Verbal Orders >> Mar 12, 2024  2:32 PM Gennette ORN wrote: Caller/Agency: Laf[facility] Rushing Number: [phone] Service Requested: Physical Therapy Frequency: 1 week 1 2 week 3  Any new concerns about the patient? Yes stiff neck , left shoulder pain , lean his head a lot , and muscle spasm. She wants to know can we add pain management to the plan. Patient was 8/10 and went to 6/10 far as pain.

## 2024-03-13 ENCOUNTER — Other Ambulatory Visit: Payer: Self-pay | Admitting: Family Medicine

## 2024-03-13 DIAGNOSIS — I480 Paroxysmal atrial fibrillation: Secondary | ICD-10-CM

## 2024-03-13 DIAGNOSIS — I6300 Cerebral infarction due to thrombosis of unspecified precerebral artery: Secondary | ICD-10-CM

## 2024-03-19 ENCOUNTER — Telehealth: Payer: Self-pay

## 2024-03-19 NOTE — Telephone Encounter (Signed)
 Copied from CRM [id number]. Topic: Clinical - Medical Advice >> Mar 19, 2024  1:39 PM Henretta I wrote: Reason for CRM: Therisa from [facility] was calling to report that patient is lethargic, has new wound, blood pressure fluctuates and needs nurse assessment tomorrow. She just need an okay for request to send her nurse out [phone]

## 2024-03-19 NOTE — Telephone Encounter (Signed)
 According to Brian [name], PT pt is refusing to go to the hospital and the family and patient have the right to refuse. It is [facility] protocol to receive verbal orders for a nurse to go to the home, who can then call 911. I provided verbal orders for the PT to call the nurse to activate EMS.

## 2024-03-19 NOTE — Telephone Encounter (Signed)
 Received a call from [place] with home Health PT, she states that upon arrival today patient is having tremors/twitching on his LT side (lasst 2 hours) and seems to be larthargic (but does respond to you when you ask questions). His BP has been 82/50, 88/50 and O2  98% on O2.  His urine looks normal.   Due to those symptoms, I did advised that he need to go to the ER to be evaluated.  She will talk to patients son with those recommendations. Dm/cma

## 2024-03-25 ENCOUNTER — Encounter: Payer: Self-pay | Admitting: Family Medicine

## 2024-03-25 ENCOUNTER — Ambulatory Visit (INDEPENDENT_AMBULATORY_CARE_PROVIDER_SITE_OTHER): Admitting: Family Medicine

## 2024-03-25 VITALS — BP 136/80 | HR 68 | Temp 97.5°F | Ht 66.0 in

## 2024-03-25 DIAGNOSIS — L740 Miliaria rubra: Secondary | ICD-10-CM | POA: Diagnosis not present

## 2024-03-25 MED ORDER — TRIAMCINOLONE ACETONIDE 0.5 % EX OINT: TOPICAL_OINTMENT | CUTANEOUS | 1 refills | Status: AC

## 2024-03-25 NOTE — Progress Notes (Unsigned)
 Established Patient Office Visit   Subjective:  Patient ID: Brian [name], male    DOB: 09/16/1932  Age: 89 y.o. MRN: [id number]  Chief Complaint  Patient presents with  . Rash    Rash on back x 2 days. Raised, red and itchy.     HPI Encounter Diagnoses  Name Primary?  . Heat rash Yes   *** {History (Optional):23778}  ROS   Current Outpatient Medications:  .  triamcinolone  ointment (KENALOG ) 0.5 %, May apply to rash on back twice daily as needed, Disp: 45 g, Rfl: 1 .  ALPRAZolam  (XANAX ) 0.25 MG tablet, Take 0.25 mg by mouth 2 (two) times daily as needed for anxiety., Disp: , Rfl:  .  apixaban  (ELIQUIS ) 5 MG TABS tablet, Take 1 tablet (5 mg total) by mouth 2 (two) times daily., Disp: 60 tablet, Rfl: 3 .  cetirizine  (ZYRTEC ) 10 MG tablet, TAKE 1 TABLET(10 MG) BY MOUTH AT BEDTIME, Disp: 90 tablet, Rfl: 2 .  clotrimazole  (LOTRIMIN ) 1 % cream, Apply 1 Application topically 2 (two) times daily as needed (for rash)., Disp: , Rfl:  .  finasteride  (PROSCAR ) 5 MG tablet, TAKE 1 TABLET(5 MG) BY MOUTH DAILY, Disp: 90 tablet, Rfl: 3 .  fluticasone (FLONASE) 50 MCG/ACT nasal spray, Place 1 spray into both nostrils daily as needed for allergies., Disp: , Rfl:  .  furosemide  (LASIX ) 20 MG tablet, Take 1 tablet (20 mg total) by mouth daily., Disp: 90 tablet, Rfl: 3 .  gentamicin ointment (GARAMYCIN) 0.1 %, Apply topically as needed (pressure sore)., Disp: , Rfl:  .  hydrocortisone  ointment 0.5 %, Apply 1 Application topically daily as needed for itching., Disp: , Rfl:  .  losartan  (COZAAR ) 25 MG tablet, Take 0.5 tablets (12.5 mg total) by mouth daily., Disp: 30 tablet, Rfl: 3 .  melatonin 5 MG TABS, Take 5 mg by mouth at bedtime as needed (sleep)., Disp: , Rfl:  .  metoprolol  succinate (TOPROL -XL) 25 MG 24 hr tablet, Take 0.5 tablets (12.5 mg total) by mouth daily., Disp: 30 tablet, Rfl: 3 .  Multiple Vitamin (MULTIVITAMIN) tablet, Take 1 tablet by mouth 2 (two) times daily., Disp: , Rfl:  .   MUPIROCIN EX, Apply 1 Application topically daily as needed (wound care)., Disp: , Rfl:  .  oxyCODONE -acetaminophen  (PERCOCET) 7.5-325 MG tablet, Take 1 tablet by mouth every 12 (twelve) hours as needed for moderate pain (pain score 4-6) or severe pain (pain score 7-10). Must last 30 days., Disp: 60 tablet, Rfl: 0 .  [START ON 04/04/2024] oxyCODONE -acetaminophen  (PERCOCET) 7.5-325 MG tablet, Take 1 tablet by mouth every 12 (twelve) hours as needed for moderate pain (pain score 4-6) or severe pain (pain score 7-10). Must last 30 days., Disp: 60 tablet, Rfl: 0 .  [START ON 05/04/2024] oxyCODONE -acetaminophen  (PERCOCET) 7.5-325 MG tablet, Take 1 tablet by mouth every 12 (twelve) hours as needed for moderate pain (pain score 4-6) or severe pain (pain score 7-10). Must last 30 days., Disp: 60 tablet, Rfl: 0 .  pantoprazole  (PROTONIX ) 40 MG tablet, Take 1 tablet (40 mg total) by mouth daily., Disp: 90 tablet, Rfl: 3 .  polyethylene glycol powder (GLYCOLAX /MIRALAX ) 17 GM/SCOOP powder, Mix one scoop with water and take daily until stools loosen. May repeat as needed., Disp: 850 g, Rfl: 1 .  pravastatin  (PRAVACHOL ) 40 MG tablet, TAKE 1 TABLET(40 MG) BY MOUTH DAILY, Disp: 90 tablet, Rfl: 3 .  pregabalin  (LYRICA ) 25 MG capsule, Take 2 capsules (50 mg total) by mouth at  bedtime., Disp: 30 capsule, Rfl: 5 .  QUEtiapine  (SEROQUEL ) 50 MG tablet, TAKE 1 TABLET(50 MG) BY MOUTH AT BEDTIME, Disp: 90 tablet, Rfl: 3 .  sertraline  (ZOLOFT ) 50 MG tablet, TAKE 1 TABLET(50 MG) BY MOUTH EVERY MORNING, Disp: 90 tablet, Rfl: 3 .  spironolactone  (ALDACTONE ) 25 MG tablet, Take 0.5 tablets (12.5 mg total) by mouth daily., Disp: 30 tablet, Rfl: 3 .  tamsulosin  (FLOMAX ) 0.4 MG CAPS capsule, TAKE 1 CAPSULE(0.4 MG) BY MOUTH DAILY, Disp: 90 capsule, Rfl: 3 .  trimethoprim (TRIMPEX) 100 MG tablet, Take 100 mg by mouth at bedtime., Disp: , Rfl:    Objective:     BP 136/80 (BP Location: Left Arm, Patient Position: Sitting, Cuff Size:  Large)   Pulse 68   Temp (!) 97.5 F (36.4 C) (Temporal)   Ht 5' 6 (1.676 m)   SpO2 91% Comment: 2 liters of O2  BMI 32.28 kg/m  {Vitals History (Optional):23777}  Physical Exam   No results found for any visits on 03/25/24.  {Labs (Optional):23779}  The ASCVD Risk score (Arnett DK, et al., 2019) failed to calculate for the following reasons:   The 2019 ASCVD risk score is only valid for ages 83 to 24   Risk score cannot be calculated because patient has a medical history suggesting prior/existing ASCVD    Assessment & Plan:   Heat rash -     Triamcinolone  Acetonide; May apply to rash on back twice daily as needed  Dispense: 45 g; Refill: 1    No follow-ups on file.    Elsie Sim Lent, MD

## 2024-03-27 ENCOUNTER — Encounter: Payer: Self-pay | Admitting: Family Medicine

## 2024-03-31 ENCOUNTER — Other Ambulatory Visit (HOSPITAL_BASED_OUTPATIENT_CLINIC_OR_DEPARTMENT_OTHER): Payer: Self-pay | Admitting: Family

## 2024-03-31 ENCOUNTER — Encounter: Payer: Self-pay | Admitting: Family Medicine

## 2024-04-25 ENCOUNTER — Telehealth: Payer: Self-pay | Admitting: Family Medicine

## 2024-04-25 NOTE — Telephone Encounter (Signed)
 ERROR

## 2024-04-29 ENCOUNTER — Encounter (HOSPITAL_BASED_OUTPATIENT_CLINIC_OR_DEPARTMENT_OTHER): Admitting: General Surgery

## 2024-05-13 ENCOUNTER — Ambulatory Visit: Admitting: Family Medicine

## 2024-05-20 ENCOUNTER — Ambulatory Visit (HOSPITAL_BASED_OUTPATIENT_CLINIC_OR_DEPARTMENT_OTHER): Admitting: Nurse Practitioner

## 2024-05-20 DIAGNOSIS — Z91199 Patient's noncompliance with other medical treatment and regimen due to unspecified reason: Secondary | ICD-10-CM

## 2024-05-20 DIAGNOSIS — G894 Chronic pain syndrome: Secondary | ICD-10-CM

## 2024-05-20 NOTE — Progress Notes (Signed)
 05/20/2024-No show

## 2024-05-30 ENCOUNTER — Encounter: Payer: Self-pay | Admitting: *Deleted

## 2024-06-03 ENCOUNTER — Ambulatory Visit (HOSPITAL_BASED_OUTPATIENT_CLINIC_OR_DEPARTMENT_OTHER): Admitting: Cardiovascular Disease

## 2024-08-21 DEATH — deceased
